# Patient Record
Sex: Female | Born: 1975 | Race: Black or African American | Hispanic: No | Marital: Married | Smoking: Former smoker
Health system: Southern US, Community
[De-identification: ages and names within clinical notes are randomized; demographics above are authoritative.]

## PROBLEM LIST (undated history)

## (undated) DIAGNOSIS — I639 Cerebral infarction, unspecified: Secondary | ICD-10-CM

## (undated) DIAGNOSIS — Z5189 Encounter for other specified aftercare: Secondary | ICD-10-CM

## (undated) DIAGNOSIS — R0789 Other chest pain: Secondary | ICD-10-CM

## (undated) DIAGNOSIS — D219 Benign neoplasm of connective and other soft tissue, unspecified: Secondary | ICD-10-CM

## (undated) DIAGNOSIS — K047 Periapical abscess without sinus: Secondary | ICD-10-CM

## (undated) DIAGNOSIS — D573 Sickle-cell trait: Secondary | ICD-10-CM

## (undated) DIAGNOSIS — D649 Anemia, unspecified: Secondary | ICD-10-CM

## (undated) DIAGNOSIS — R569 Unspecified convulsions: Secondary | ICD-10-CM

## (undated) DIAGNOSIS — M069 Rheumatoid arthritis, unspecified: Secondary | ICD-10-CM

## (undated) HISTORY — DX: Other chest pain: R07.89

---

## 1998-03-26 ENCOUNTER — Emergency Department (HOSPITAL_COMMUNITY): Admission: EM | Admit: 1998-03-26 | Discharge: 1998-03-26 | Payer: Self-pay | Admitting: Emergency Medicine

## 1998-03-30 ENCOUNTER — Emergency Department (HOSPITAL_COMMUNITY): Admission: EM | Admit: 1998-03-30 | Discharge: 1998-03-30 | Payer: Self-pay | Admitting: Emergency Medicine

## 1998-06-16 ENCOUNTER — Emergency Department (HOSPITAL_COMMUNITY): Admission: EM | Admit: 1998-06-16 | Discharge: 1998-06-16 | Payer: Self-pay | Admitting: *Deleted

## 1998-06-22 ENCOUNTER — Emergency Department (HOSPITAL_COMMUNITY): Admission: EM | Admit: 1998-06-22 | Discharge: 1998-06-22 | Payer: Self-pay | Admitting: Emergency Medicine

## 1998-09-28 ENCOUNTER — Emergency Department (HOSPITAL_COMMUNITY): Admission: EM | Admit: 1998-09-28 | Discharge: 1998-09-28 | Payer: Self-pay | Admitting: Emergency Medicine

## 1998-11-23 ENCOUNTER — Emergency Department (HOSPITAL_COMMUNITY): Admission: EM | Admit: 1998-11-23 | Discharge: 1998-11-23 | Payer: Self-pay | Admitting: Emergency Medicine

## 1999-01-30 ENCOUNTER — Emergency Department (HOSPITAL_COMMUNITY): Admission: EM | Admit: 1999-01-30 | Discharge: 1999-01-30 | Payer: Self-pay | Admitting: *Deleted

## 1999-02-17 ENCOUNTER — Emergency Department (HOSPITAL_COMMUNITY): Admission: EM | Admit: 1999-02-17 | Discharge: 1999-02-17 | Payer: Self-pay | Admitting: Emergency Medicine

## 1999-04-19 ENCOUNTER — Encounter: Payer: Self-pay | Admitting: Emergency Medicine

## 1999-04-19 ENCOUNTER — Emergency Department (HOSPITAL_COMMUNITY): Admission: EM | Admit: 1999-04-19 | Discharge: 1999-04-19 | Payer: Self-pay | Admitting: Emergency Medicine

## 1999-11-29 ENCOUNTER — Emergency Department (HOSPITAL_COMMUNITY): Admission: EM | Admit: 1999-11-29 | Discharge: 1999-11-29 | Payer: Self-pay | Admitting: Emergency Medicine

## 1999-11-30 ENCOUNTER — Encounter: Payer: Self-pay | Admitting: Emergency Medicine

## 1999-12-04 ENCOUNTER — Emergency Department (HOSPITAL_COMMUNITY): Admission: EM | Admit: 1999-12-04 | Discharge: 1999-12-04 | Payer: Self-pay | Admitting: *Deleted

## 1999-12-05 ENCOUNTER — Encounter: Payer: Self-pay | Admitting: Emergency Medicine

## 2000-02-06 ENCOUNTER — Emergency Department (HOSPITAL_COMMUNITY): Admission: EM | Admit: 2000-02-06 | Discharge: 2000-02-06 | Payer: Self-pay | Admitting: Emergency Medicine

## 2000-02-08 ENCOUNTER — Emergency Department (HOSPITAL_COMMUNITY): Admission: EM | Admit: 2000-02-08 | Discharge: 2000-02-08 | Payer: Self-pay | Admitting: Emergency Medicine

## 2000-02-08 ENCOUNTER — Encounter: Payer: Self-pay | Admitting: Emergency Medicine

## 2000-03-22 ENCOUNTER — Emergency Department (HOSPITAL_COMMUNITY): Admission: EM | Admit: 2000-03-22 | Discharge: 2000-03-22 | Payer: Self-pay | Admitting: *Deleted

## 2000-04-22 ENCOUNTER — Emergency Department (HOSPITAL_COMMUNITY): Admission: EM | Admit: 2000-04-22 | Discharge: 2000-04-22 | Payer: Self-pay | Admitting: Emergency Medicine

## 2000-05-28 ENCOUNTER — Emergency Department (HOSPITAL_COMMUNITY): Admission: EM | Admit: 2000-05-28 | Discharge: 2000-05-28 | Payer: Self-pay | Admitting: Emergency Medicine

## 2000-05-28 ENCOUNTER — Encounter: Payer: Self-pay | Admitting: Emergency Medicine

## 2000-06-11 ENCOUNTER — Emergency Department (HOSPITAL_COMMUNITY): Admission: EM | Admit: 2000-06-11 | Discharge: 2000-06-11 | Payer: Self-pay

## 2000-08-28 ENCOUNTER — Emergency Department (HOSPITAL_COMMUNITY): Admission: EM | Admit: 2000-08-28 | Discharge: 2000-08-28 | Payer: Self-pay | Admitting: Emergency Medicine

## 2000-10-15 ENCOUNTER — Encounter (INDEPENDENT_AMBULATORY_CARE_PROVIDER_SITE_OTHER): Payer: Self-pay | Admitting: Specialist

## 2000-10-15 ENCOUNTER — Other Ambulatory Visit: Admission: RE | Admit: 2000-10-15 | Discharge: 2000-10-15 | Payer: Self-pay | Admitting: Obstetrics

## 2000-10-20 ENCOUNTER — Emergency Department (HOSPITAL_COMMUNITY): Admission: EM | Admit: 2000-10-20 | Discharge: 2000-10-20 | Payer: Self-pay | Admitting: Emergency Medicine

## 2000-11-08 ENCOUNTER — Inpatient Hospital Stay (HOSPITAL_COMMUNITY): Admission: AD | Admit: 2000-11-08 | Discharge: 2000-11-08 | Payer: Self-pay | Admitting: Obstetrics & Gynecology

## 2000-12-03 ENCOUNTER — Encounter: Payer: Self-pay | Admitting: Emergency Medicine

## 2000-12-03 ENCOUNTER — Emergency Department (HOSPITAL_COMMUNITY): Admission: EM | Admit: 2000-12-03 | Discharge: 2000-12-03 | Payer: Self-pay | Admitting: Emergency Medicine

## 2001-01-06 ENCOUNTER — Emergency Department (HOSPITAL_COMMUNITY): Admission: EM | Admit: 2001-01-06 | Discharge: 2001-01-06 | Payer: Self-pay | Admitting: Emergency Medicine

## 2001-01-08 ENCOUNTER — Emergency Department (HOSPITAL_COMMUNITY): Admission: EM | Admit: 2001-01-08 | Discharge: 2001-01-08 | Payer: Self-pay | Admitting: Emergency Medicine

## 2001-01-10 ENCOUNTER — Inpatient Hospital Stay (HOSPITAL_COMMUNITY): Admission: EM | Admit: 2001-01-10 | Discharge: 2001-01-12 | Payer: Self-pay | Admitting: Emergency Medicine

## 2001-01-10 ENCOUNTER — Encounter: Payer: Self-pay | Admitting: Emergency Medicine

## 2001-01-11 ENCOUNTER — Encounter: Payer: Self-pay | Admitting: Internal Medicine

## 2001-01-17 ENCOUNTER — Encounter: Admission: RE | Admit: 2001-01-17 | Discharge: 2001-01-17 | Payer: Self-pay | Admitting: Internal Medicine

## 2001-02-14 ENCOUNTER — Inpatient Hospital Stay (HOSPITAL_COMMUNITY): Admission: EM | Admit: 2001-02-14 | Discharge: 2001-02-16 | Payer: Self-pay | Admitting: Emergency Medicine

## 2001-02-14 ENCOUNTER — Encounter: Payer: Self-pay | Admitting: Infectious Diseases

## 2001-02-14 ENCOUNTER — Encounter: Payer: Self-pay | Admitting: Emergency Medicine

## 2001-02-26 ENCOUNTER — Encounter: Admission: RE | Admit: 2001-02-26 | Discharge: 2001-02-26 | Payer: Self-pay | Admitting: Hematology and Oncology

## 2001-04-19 ENCOUNTER — Encounter: Admission: RE | Admit: 2001-04-19 | Discharge: 2001-04-19 | Payer: Self-pay | Admitting: Obstetrics & Gynecology

## 2001-05-19 ENCOUNTER — Emergency Department (HOSPITAL_COMMUNITY): Admission: EM | Admit: 2001-05-19 | Discharge: 2001-05-19 | Payer: Self-pay

## 2001-06-10 ENCOUNTER — Emergency Department (HOSPITAL_COMMUNITY): Admission: EM | Admit: 2001-06-10 | Discharge: 2001-06-11 | Payer: Self-pay

## 2001-06-11 ENCOUNTER — Emergency Department (HOSPITAL_COMMUNITY): Admission: EM | Admit: 2001-06-11 | Discharge: 2001-06-11 | Payer: Self-pay | Admitting: Emergency Medicine

## 2001-06-11 ENCOUNTER — Encounter: Payer: Self-pay | Admitting: Emergency Medicine

## 2001-07-08 ENCOUNTER — Emergency Department (HOSPITAL_COMMUNITY): Admission: EM | Admit: 2001-07-08 | Discharge: 2001-07-08 | Payer: Self-pay | Admitting: Emergency Medicine

## 2001-07-14 ENCOUNTER — Inpatient Hospital Stay (HOSPITAL_COMMUNITY): Admission: AD | Admit: 2001-07-14 | Discharge: 2001-07-14 | Payer: Self-pay | Admitting: Obstetrics

## 2001-09-09 ENCOUNTER — Emergency Department (HOSPITAL_COMMUNITY): Admission: EM | Admit: 2001-09-09 | Discharge: 2001-09-09 | Payer: Self-pay | Admitting: Emergency Medicine

## 2001-09-09 ENCOUNTER — Encounter: Payer: Self-pay | Admitting: Emergency Medicine

## 2001-10-09 ENCOUNTER — Inpatient Hospital Stay (HOSPITAL_COMMUNITY): Admission: AD | Admit: 2001-10-09 | Discharge: 2001-10-09 | Payer: Self-pay | Admitting: Obstetrics

## 2001-10-10 ENCOUNTER — Inpatient Hospital Stay (HOSPITAL_COMMUNITY): Admission: AD | Admit: 2001-10-10 | Discharge: 2001-10-10 | Payer: Self-pay | Admitting: Obstetrics & Gynecology

## 2001-10-10 ENCOUNTER — Encounter: Payer: Self-pay | Admitting: Obstetrics & Gynecology

## 2001-10-15 ENCOUNTER — Emergency Department (HOSPITAL_COMMUNITY): Admission: EM | Admit: 2001-10-15 | Discharge: 2001-10-15 | Payer: Self-pay | Admitting: Emergency Medicine

## 2001-11-25 ENCOUNTER — Emergency Department (HOSPITAL_COMMUNITY): Admission: EM | Admit: 2001-11-25 | Discharge: 2001-11-25 | Payer: Self-pay | Admitting: Emergency Medicine

## 2002-01-17 ENCOUNTER — Emergency Department (HOSPITAL_COMMUNITY): Admission: EM | Admit: 2002-01-17 | Discharge: 2002-01-18 | Payer: Self-pay | Admitting: Emergency Medicine

## 2002-04-15 ENCOUNTER — Inpatient Hospital Stay (HOSPITAL_COMMUNITY): Admission: AD | Admit: 2002-04-15 | Discharge: 2002-04-15 | Payer: Self-pay | Admitting: *Deleted

## 2002-04-15 ENCOUNTER — Encounter: Payer: Self-pay | Admitting: *Deleted

## 2002-06-07 ENCOUNTER — Emergency Department (HOSPITAL_COMMUNITY): Admission: EM | Admit: 2002-06-07 | Discharge: 2002-06-07 | Payer: Self-pay | Admitting: Emergency Medicine

## 2002-07-02 ENCOUNTER — Emergency Department (HOSPITAL_COMMUNITY): Admission: EM | Admit: 2002-07-02 | Discharge: 2002-07-03 | Payer: Self-pay | Admitting: Emergency Medicine

## 2002-07-02 ENCOUNTER — Encounter: Payer: Self-pay | Admitting: Emergency Medicine

## 2002-08-12 ENCOUNTER — Encounter: Payer: Self-pay | Admitting: Emergency Medicine

## 2002-08-12 ENCOUNTER — Emergency Department (HOSPITAL_COMMUNITY): Admission: EM | Admit: 2002-08-12 | Discharge: 2002-08-12 | Payer: Self-pay | Admitting: Emergency Medicine

## 2002-08-13 ENCOUNTER — Emergency Department (HOSPITAL_COMMUNITY): Admission: EM | Admit: 2002-08-13 | Discharge: 2002-08-13 | Payer: Self-pay

## 2002-09-22 ENCOUNTER — Inpatient Hospital Stay (HOSPITAL_COMMUNITY): Admission: AD | Admit: 2002-09-22 | Discharge: 2002-09-22 | Payer: Self-pay | Admitting: *Deleted

## 2002-11-01 ENCOUNTER — Emergency Department (HOSPITAL_COMMUNITY): Admission: EM | Admit: 2002-11-01 | Discharge: 2002-11-01 | Payer: Self-pay | Admitting: Emergency Medicine

## 2002-11-12 ENCOUNTER — Emergency Department (HOSPITAL_COMMUNITY): Admission: EM | Admit: 2002-11-12 | Discharge: 2002-11-12 | Payer: Self-pay

## 2002-12-14 ENCOUNTER — Emergency Department (HOSPITAL_COMMUNITY): Admission: EM | Admit: 2002-12-14 | Discharge: 2002-12-14 | Payer: Self-pay | Admitting: Emergency Medicine

## 2003-03-03 ENCOUNTER — Inpatient Hospital Stay (HOSPITAL_COMMUNITY): Admission: AD | Admit: 2003-03-03 | Discharge: 2003-03-03 | Payer: Self-pay | Admitting: *Deleted

## 2003-05-03 ENCOUNTER — Emergency Department (HOSPITAL_COMMUNITY): Admission: EM | Admit: 2003-05-03 | Discharge: 2003-05-04 | Payer: Self-pay

## 2003-05-04 ENCOUNTER — Encounter: Payer: Self-pay | Admitting: Emergency Medicine

## 2003-05-13 ENCOUNTER — Inpatient Hospital Stay (HOSPITAL_COMMUNITY): Admission: AD | Admit: 2003-05-13 | Discharge: 2003-05-13 | Payer: Self-pay | Admitting: Family Medicine

## 2003-05-14 ENCOUNTER — Encounter: Payer: Self-pay | Admitting: Family Medicine

## 2003-05-23 ENCOUNTER — Inpatient Hospital Stay (HOSPITAL_COMMUNITY): Admission: AD | Admit: 2003-05-23 | Discharge: 2003-05-23 | Payer: Self-pay | Admitting: Obstetrics and Gynecology

## 2003-06-21 ENCOUNTER — Inpatient Hospital Stay (HOSPITAL_COMMUNITY): Admission: AD | Admit: 2003-06-21 | Discharge: 2003-06-22 | Payer: Self-pay | Admitting: *Deleted

## 2003-06-24 ENCOUNTER — Encounter: Payer: Self-pay | Admitting: Obstetrics and Gynecology

## 2003-06-24 ENCOUNTER — Inpatient Hospital Stay (HOSPITAL_COMMUNITY): Admission: AD | Admit: 2003-06-24 | Discharge: 2003-06-24 | Payer: Self-pay | Admitting: Obstetrics and Gynecology

## 2003-07-18 ENCOUNTER — Inpatient Hospital Stay (HOSPITAL_COMMUNITY): Admission: AD | Admit: 2003-07-18 | Discharge: 2003-07-18 | Payer: Self-pay | Admitting: *Deleted

## 2003-08-02 ENCOUNTER — Inpatient Hospital Stay (HOSPITAL_COMMUNITY): Admission: AD | Admit: 2003-08-02 | Discharge: 2003-08-02 | Payer: Self-pay | Admitting: Obstetrics & Gynecology

## 2003-08-02 ENCOUNTER — Encounter: Payer: Self-pay | Admitting: Obstetrics & Gynecology

## 2003-08-03 ENCOUNTER — Ambulatory Visit (HOSPITAL_COMMUNITY): Admission: RE | Admit: 2003-08-03 | Discharge: 2003-08-03 | Payer: Self-pay | Admitting: *Deleted

## 2003-08-22 ENCOUNTER — Inpatient Hospital Stay (HOSPITAL_COMMUNITY): Admission: AD | Admit: 2003-08-22 | Discharge: 2003-08-22 | Payer: Self-pay | Admitting: Obstetrics and Gynecology

## 2003-09-03 ENCOUNTER — Emergency Department (HOSPITAL_COMMUNITY): Admission: EM | Admit: 2003-09-03 | Discharge: 2003-09-03 | Payer: Self-pay | Admitting: Emergency Medicine

## 2003-09-30 ENCOUNTER — Inpatient Hospital Stay (HOSPITAL_COMMUNITY): Admission: AD | Admit: 2003-09-30 | Discharge: 2003-09-30 | Payer: Self-pay | Admitting: *Deleted

## 2003-10-15 ENCOUNTER — Inpatient Hospital Stay (HOSPITAL_COMMUNITY): Admission: AD | Admit: 2003-10-15 | Discharge: 2003-10-15 | Payer: Self-pay | Admitting: Obstetrics & Gynecology

## 2003-10-20 ENCOUNTER — Emergency Department (HOSPITAL_COMMUNITY): Admission: AD | Admit: 2003-10-20 | Discharge: 2003-10-20 | Payer: Self-pay | Admitting: Family Medicine

## 2003-10-27 ENCOUNTER — Inpatient Hospital Stay (HOSPITAL_COMMUNITY): Admission: AD | Admit: 2003-10-27 | Discharge: 2003-10-28 | Payer: Self-pay | Admitting: Obstetrics & Gynecology

## 2003-11-28 ENCOUNTER — Inpatient Hospital Stay (HOSPITAL_COMMUNITY): Admission: AD | Admit: 2003-11-28 | Discharge: 2003-11-28 | Payer: Self-pay | Admitting: Family Medicine

## 2003-12-12 HISTORY — PX: TUBAL LIGATION: SHX77

## 2003-12-16 ENCOUNTER — Inpatient Hospital Stay (HOSPITAL_COMMUNITY): Admission: AD | Admit: 2003-12-16 | Discharge: 2003-12-16 | Payer: Self-pay | Admitting: Obstetrics and Gynecology

## 2003-12-19 ENCOUNTER — Inpatient Hospital Stay (HOSPITAL_COMMUNITY): Admission: AD | Admit: 2003-12-19 | Discharge: 2003-12-19 | Payer: Self-pay | Admitting: Obstetrics & Gynecology

## 2003-12-20 ENCOUNTER — Inpatient Hospital Stay (HOSPITAL_COMMUNITY): Admission: AD | Admit: 2003-12-20 | Discharge: 2003-12-20 | Payer: Self-pay | Admitting: Family Medicine

## 2003-12-25 ENCOUNTER — Inpatient Hospital Stay (HOSPITAL_COMMUNITY): Admission: AD | Admit: 2003-12-25 | Discharge: 2003-12-25 | Payer: Self-pay | Admitting: Family Medicine

## 2003-12-26 ENCOUNTER — Inpatient Hospital Stay (HOSPITAL_COMMUNITY): Admission: AD | Admit: 2003-12-26 | Discharge: 2003-12-28 | Payer: Self-pay | Admitting: Obstetrics & Gynecology

## 2003-12-27 ENCOUNTER — Encounter (INDEPENDENT_AMBULATORY_CARE_PROVIDER_SITE_OTHER): Payer: Self-pay | Admitting: Specialist

## 2004-05-27 ENCOUNTER — Emergency Department (HOSPITAL_COMMUNITY): Admission: EM | Admit: 2004-05-27 | Discharge: 2004-05-27 | Payer: Self-pay | Admitting: Family Medicine

## 2004-06-05 ENCOUNTER — Emergency Department (HOSPITAL_COMMUNITY): Admission: EM | Admit: 2004-06-05 | Discharge: 2004-06-05 | Payer: Self-pay | Admitting: Emergency Medicine

## 2004-06-23 IMAGING — US US OB FOLLOW-UP
1 series · 3 of 3 positions shown · non-contrast
Comparison: none

CLINICAL DATA: 27 year old.  G2 P1 with scan performed to evaluate growth and biophysical profile.

[Series 1: us ob re-eval · 3 of 3 slices shown]
[im 1/3]
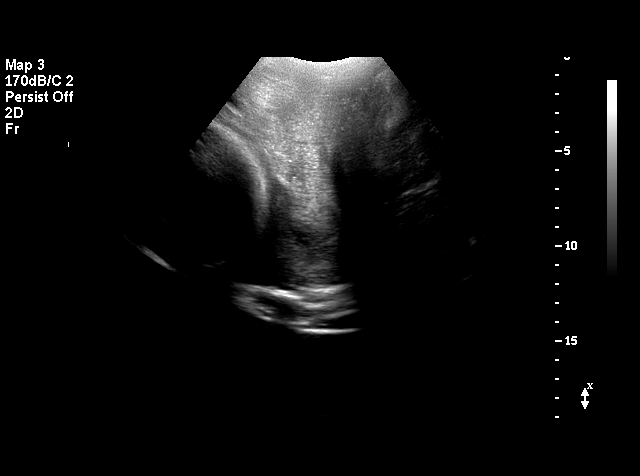
[im 2/3]
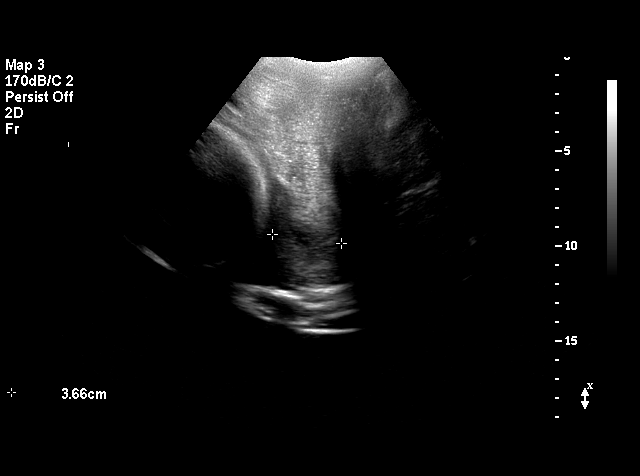
[im 3/3]
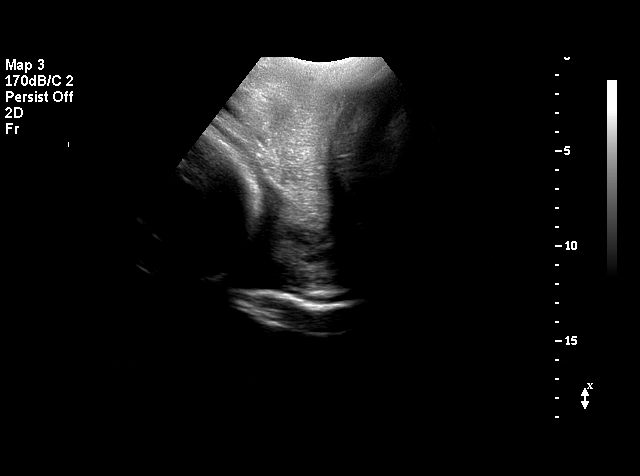

[3 of 3 positions shown; findings below may reference images not displayed]

OBSTETRICAL ULTRASOUND RE-EVALUATION

 NUMBER OF FETUSES:  1
 HEART RATE:  136
 MOVEMENT:  Yes
 BREATHING:  Yes
 PRESENTATION:  Cephalic
 PLACENTAL LOCATION:  Posterior
 GRADE:  I
 PREVIA:  No
 AMNIOTIC FLUID (subjective):  Normal
 AMNIOTIC FLUID (objective):  16.4 cm AFI (5th – 95th %ile = 8.8 – 23.8 cm for 31 weeks)

 FETAL BIOMETRY
 BPD:  7.8 cm    31 w 2 d
 HC:  28.4 cm  31 w 2 d
 AC:  25.9 cm  30 w 1 d
 FL:  5.6 cm  29 w 4 d
 MEAN GA:  30 w 4 d
 ASSIGNED GA:  30 w 6 d (1st US)
 EFW:  2922 (H) 50th – 75th %ile (7268 – 3130 g) For 31 weeks

 FETAL ANATOMY
 LATERAL VENTRICLES:  Visualized 
 THALAMI/CSP:  Previously seen 
 POSTERIOR FOSSA:  Previously seen 
 NUCHAL REGION:  N/A
 SPINE:  Previously seen 
 4 CHAMBER HEART ON LEFT:  Previously seen 
 STOMACH ON LEFT:  Visualized 
 3 VESSEL CORD:  Previously seen 
 CORD INSERTION SITE:  Previously seen 
 KIDNEYS:  Visualized 
 BLADDER:  Visualized 
 EXTREMITIES:  Previously seen 

 ADDITIONAL ANATOMY VISUALIZED:  Diaphragm and male genitalia

 MATERNAL FINDINGS
 CERVIX:  3.7 cm Translabially
 BIOPHYSICAL PROFILE:
 MOVEMENT:   2, BREATHING:  2,  TONE:  2, FLUID 2 = Total 8 of 8 in 30 minutes
IMPRESSION: Single living intrauterine fetus in cephalic presentation.  There has been appropriate interval growth.  Biophysical profile 8 of 8.  Amniotic fluid volume is within normal limits.

## 2004-07-04 ENCOUNTER — Emergency Department (HOSPITAL_COMMUNITY): Admission: EM | Admit: 2004-07-04 | Discharge: 2004-07-05 | Payer: Self-pay | Admitting: Emergency Medicine

## 2004-11-01 ENCOUNTER — Emergency Department (HOSPITAL_COMMUNITY): Admission: EM | Admit: 2004-11-01 | Discharge: 2004-11-01 | Payer: Self-pay | Admitting: Emergency Medicine

## 2004-11-30 ENCOUNTER — Emergency Department (HOSPITAL_COMMUNITY): Admission: EM | Admit: 2004-11-30 | Discharge: 2004-12-01 | Payer: Self-pay | Admitting: Emergency Medicine

## 2004-12-21 ENCOUNTER — Emergency Department (HOSPITAL_COMMUNITY): Admission: EM | Admit: 2004-12-21 | Discharge: 2004-12-22 | Payer: Self-pay | Admitting: Emergency Medicine

## 2005-03-03 IMAGING — CR DG CHEST 2V
2 series · 2 of 2 positions shown · non-contrast
Comparison: none

CLINICAL DATA: Left-sided chest pain.
 CHEST, TWO VIEWS

  The heart size and mediastinal contours are normal. The lungs are clear. The visualized skeleton is unremarkable.
 IMPRESSION
 No active disease.

[view not recorded (1 of 2)]
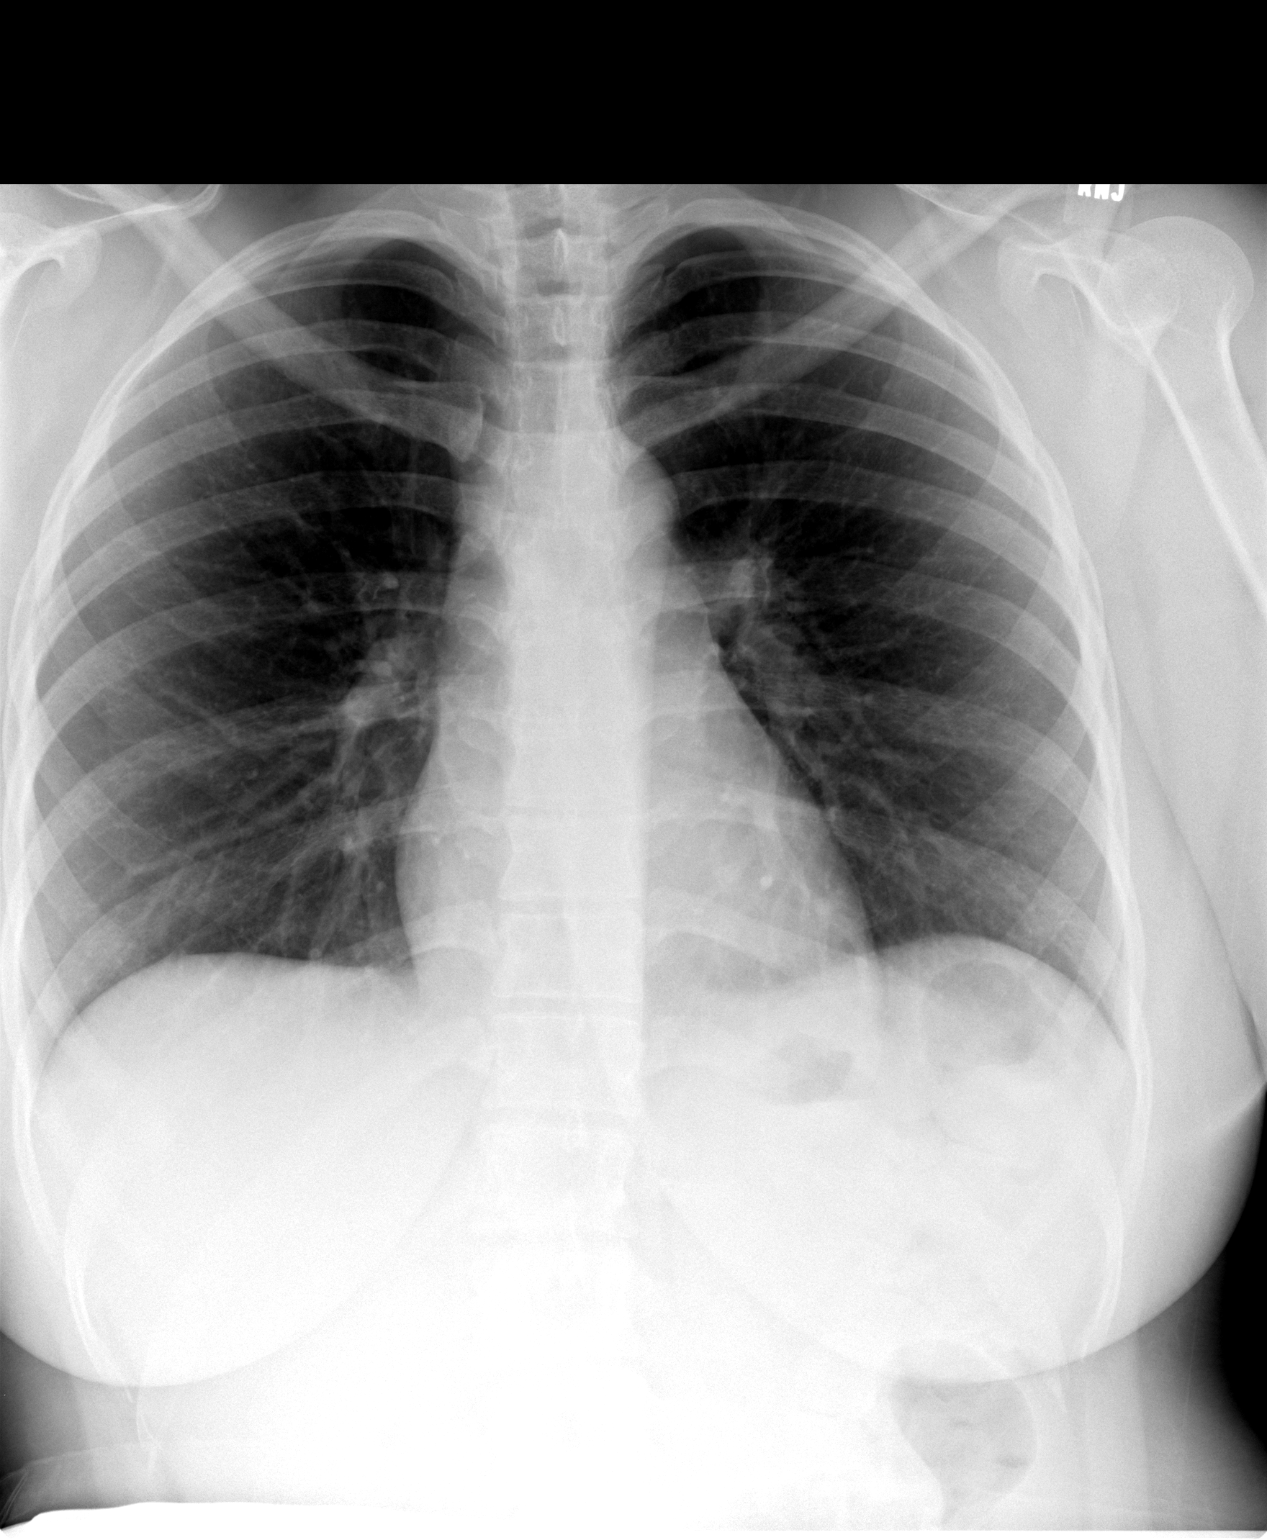

[view not recorded (2 of 2)]
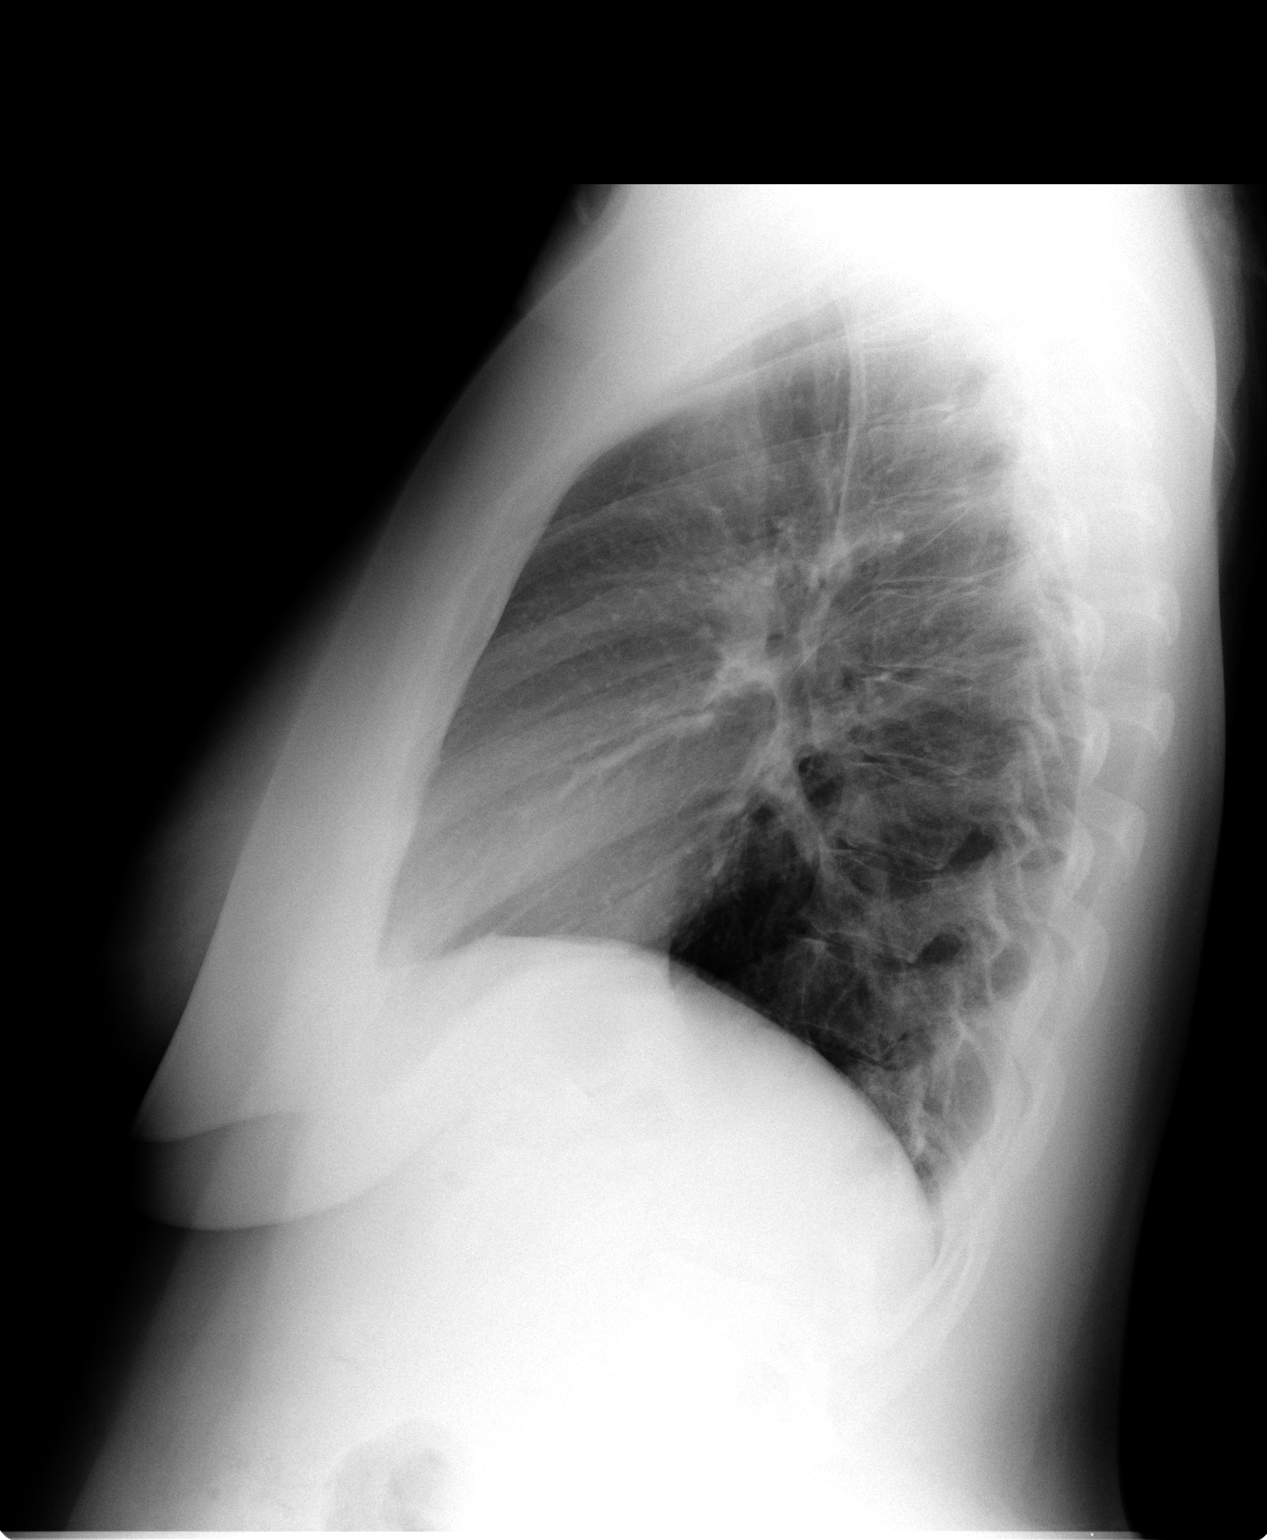

[2 of 2 positions shown; findings below may reference images not displayed]

## 2005-03-03 IMAGING — CR DG CERVICAL SPINE COMPLETE 4+V
6 series · 6 of 6 positions shown · non-contrast
Comparison: none

CLINICAL DATA: Left-sided neck pain with tingling in the arms. 
 CERVICAL SPINE (FIVE VIEWS)
 The alignment is normal.  There is no evidence of fracture, soft tissue swelling, or degenerative change.
 IMPRESSION
 Negative radiographs.

[view not recorded (1 of 6)]
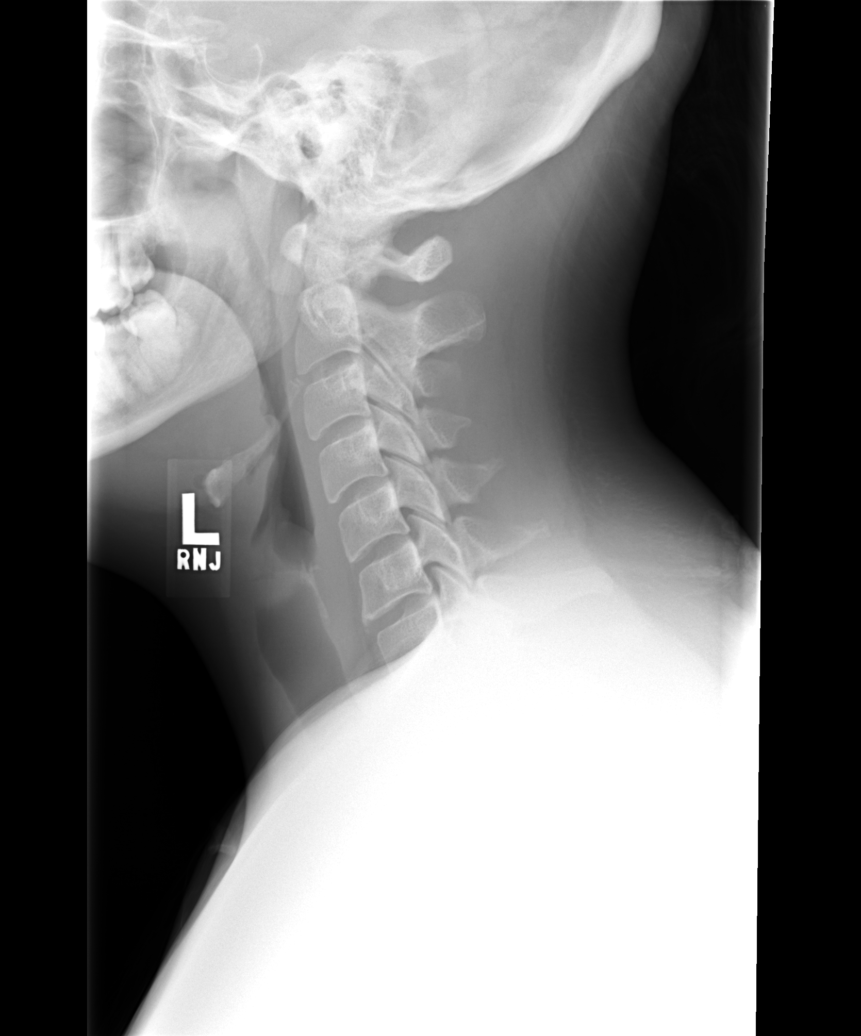

[view not recorded (2 of 6)]
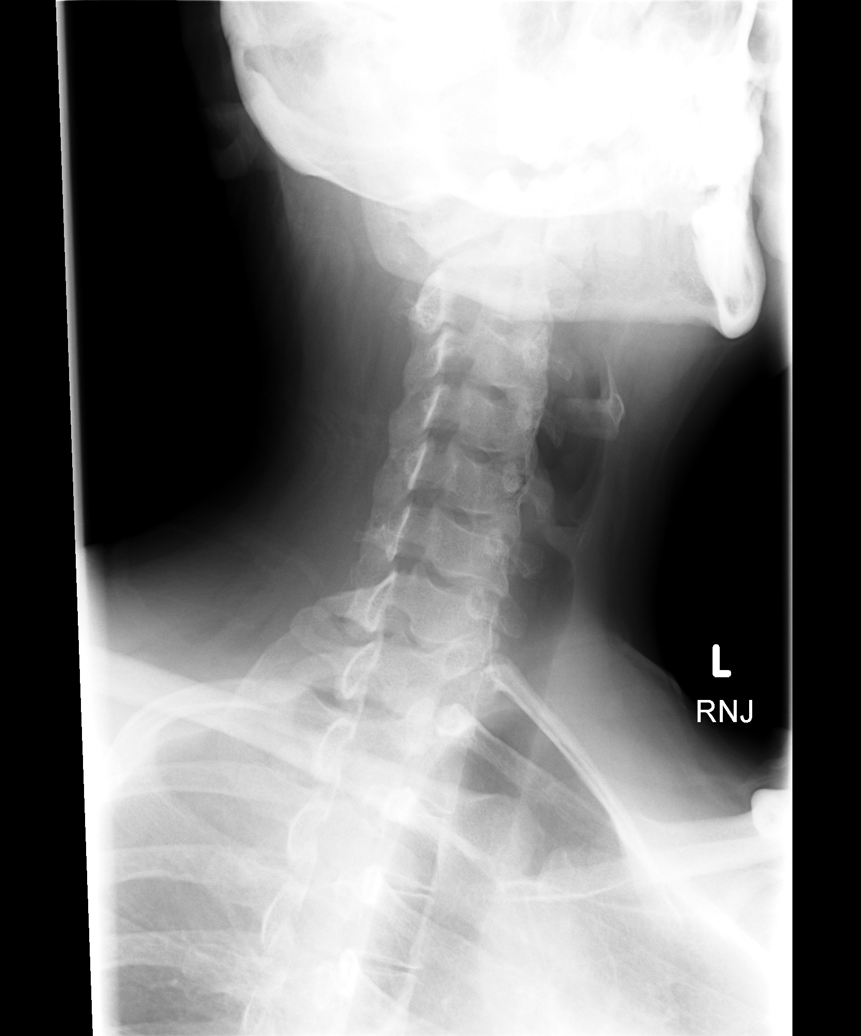

[view not recorded (3 of 6)]
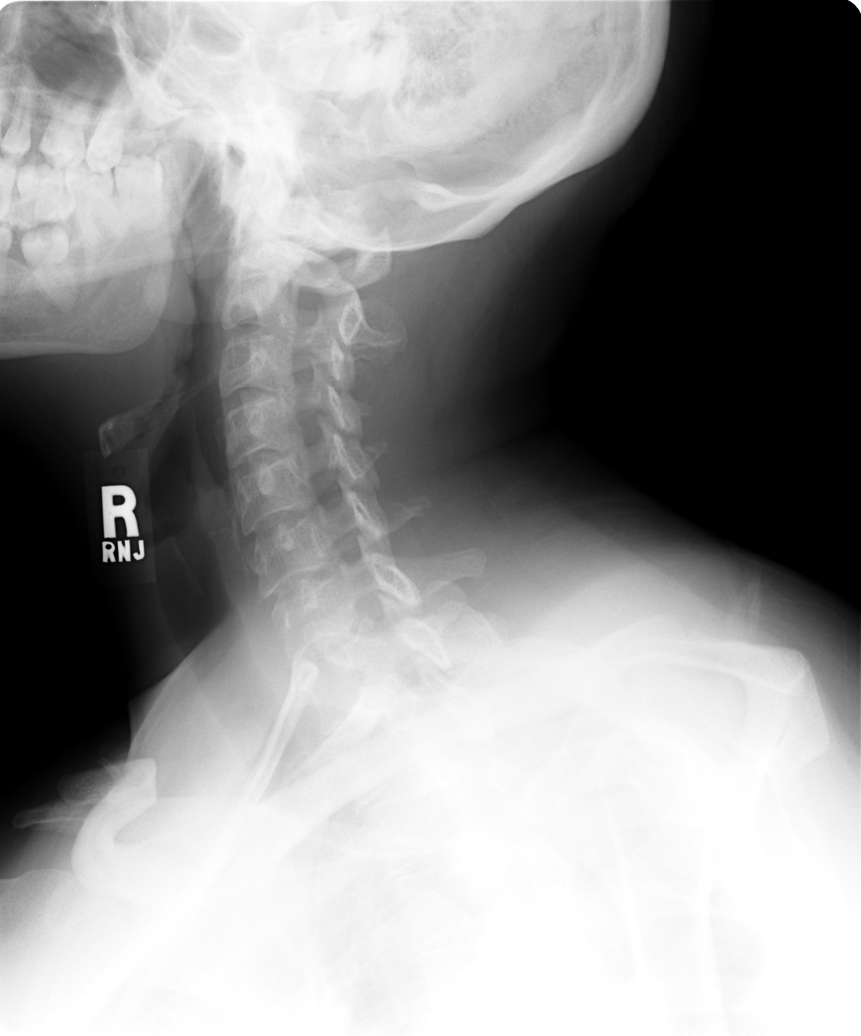

[view not recorded (4 of 6)]
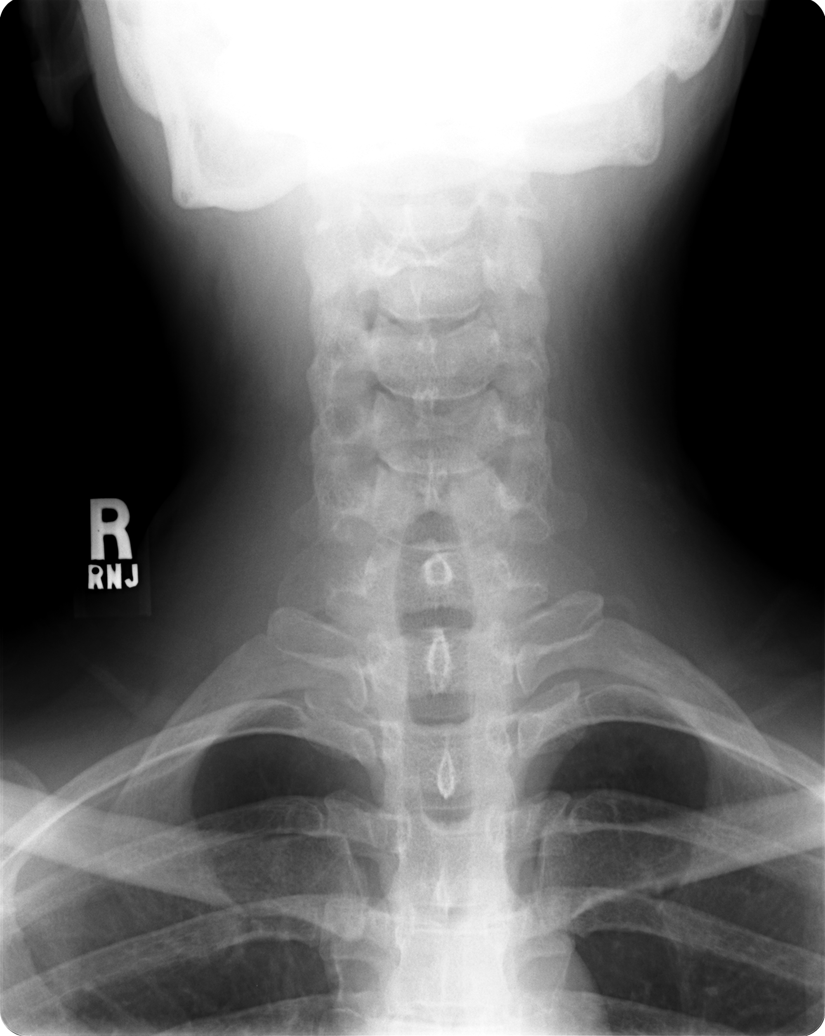

[view not recorded (5 of 6)]
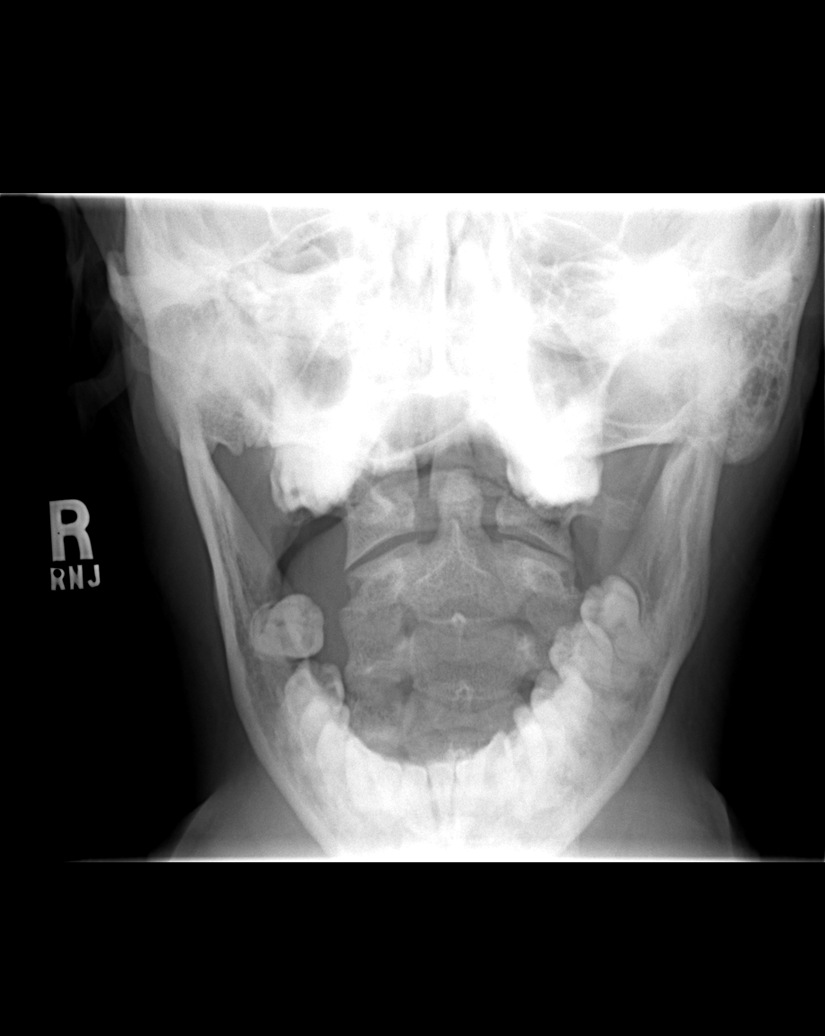

[view not recorded (6 of 6)]
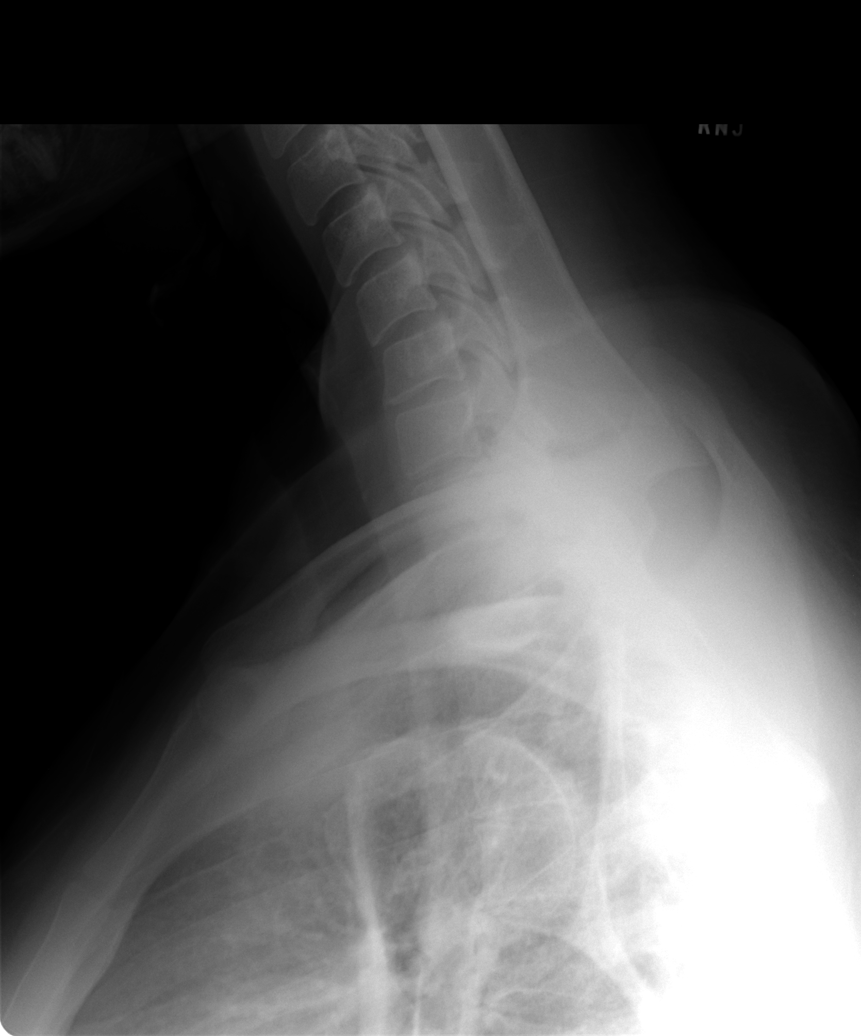

[6 of 6 positions shown; findings below may reference images not displayed]

## 2005-04-04 ENCOUNTER — Emergency Department (HOSPITAL_COMMUNITY): Admission: EM | Admit: 2005-04-04 | Discharge: 2005-04-04 | Payer: Self-pay | Admitting: Emergency Medicine

## 2005-04-24 ENCOUNTER — Emergency Department (HOSPITAL_COMMUNITY): Admission: EM | Admit: 2005-04-24 | Discharge: 2005-04-24 | Payer: Self-pay | Admitting: Family Medicine

## 2005-05-24 ENCOUNTER — Emergency Department (HOSPITAL_COMMUNITY): Admission: EM | Admit: 2005-05-24 | Discharge: 2005-05-24 | Payer: Self-pay | Admitting: Emergency Medicine

## 2005-06-04 ENCOUNTER — Emergency Department (HOSPITAL_COMMUNITY): Admission: EM | Admit: 2005-06-04 | Discharge: 2005-06-05 | Payer: Self-pay | Admitting: Emergency Medicine

## 2005-06-19 ENCOUNTER — Emergency Department (HOSPITAL_COMMUNITY): Admission: EM | Admit: 2005-06-19 | Discharge: 2005-06-19 | Payer: Self-pay | Admitting: Emergency Medicine

## 2005-07-22 ENCOUNTER — Emergency Department (HOSPITAL_COMMUNITY): Admission: EM | Admit: 2005-07-22 | Discharge: 2005-07-22 | Payer: Self-pay | Admitting: Emergency Medicine

## 2005-08-02 ENCOUNTER — Inpatient Hospital Stay (HOSPITAL_COMMUNITY): Admission: AD | Admit: 2005-08-02 | Discharge: 2005-08-02 | Payer: Self-pay | Admitting: Obstetrics and Gynecology

## 2005-09-12 ENCOUNTER — Emergency Department (HOSPITAL_COMMUNITY): Admission: EM | Admit: 2005-09-12 | Discharge: 2005-09-13 | Payer: Self-pay | Admitting: Emergency Medicine

## 2005-10-23 ENCOUNTER — Emergency Department (HOSPITAL_COMMUNITY): Admission: EM | Admit: 2005-10-23 | Discharge: 2005-10-23 | Payer: Self-pay | Admitting: Family Medicine

## 2005-11-08 ENCOUNTER — Emergency Department (HOSPITAL_COMMUNITY): Admission: EM | Admit: 2005-11-08 | Discharge: 2005-11-08 | Payer: Self-pay | Admitting: *Deleted

## 2005-12-01 IMAGING — CT CT ABDOMEN W/ CM
1 of 2 series · 15 of 32 positions shown, 19 images · IV contrast (omnipaque)
Comparison: None.

CLINICAL DATA: 29-year-old; abdominal pain
TECHNIQUE: Helical CT examination of the abdomen and pelvis was performed after bolus infusion of a total of 100 cc Omnipaque 300 and the use of dilute oral contrast.

[Series 2: routine abdomen · axial · 0.74mm/px · z∈[-417,-52]mm · 15 of 81 slices shown, 19 images]
[im 4/81  soft-tissue]
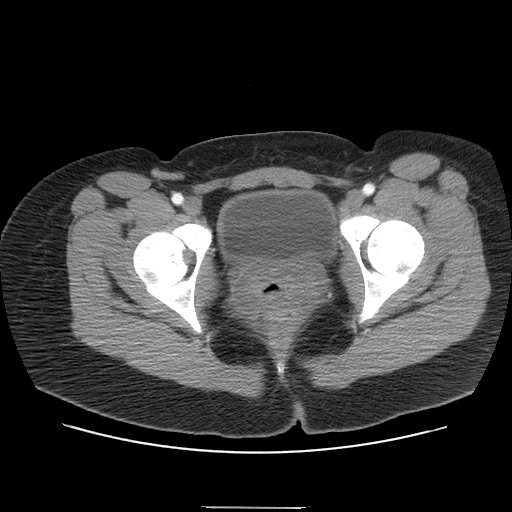
[im 4/81  bone]
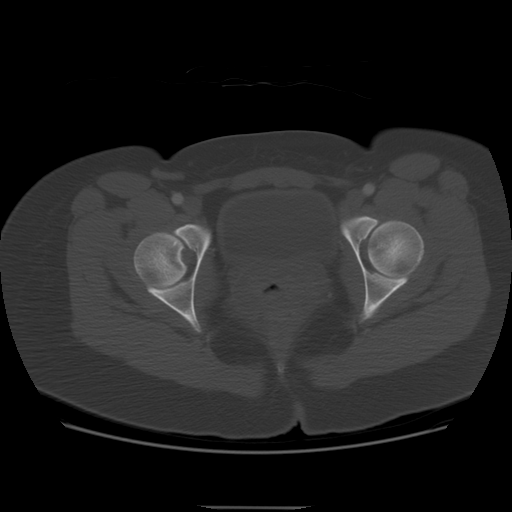
[im 11/81  soft-tissue]
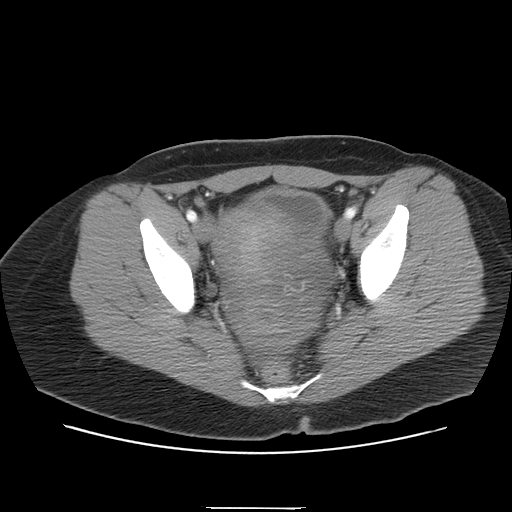
[im 18/81  soft-tissue]
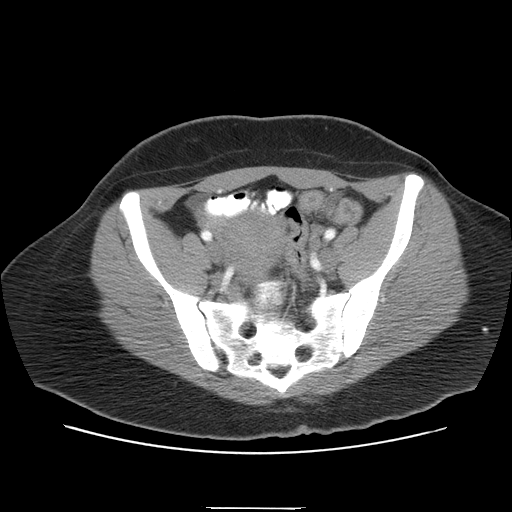
[im 21/81  soft-tissue]
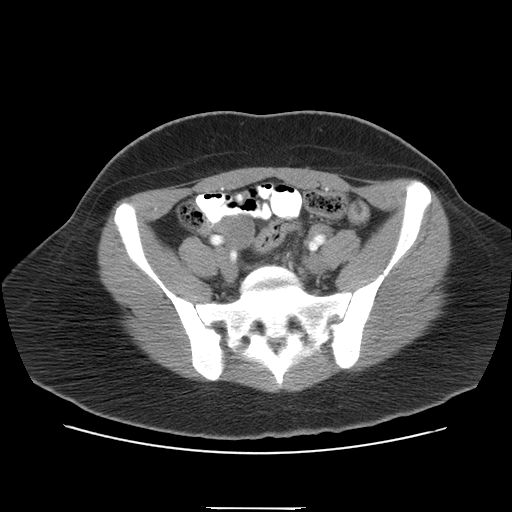
[im 28/81  soft-tissue]
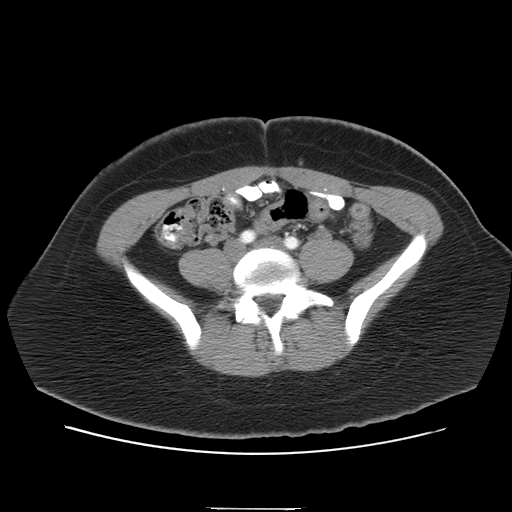
[im 35/81  soft-tissue]
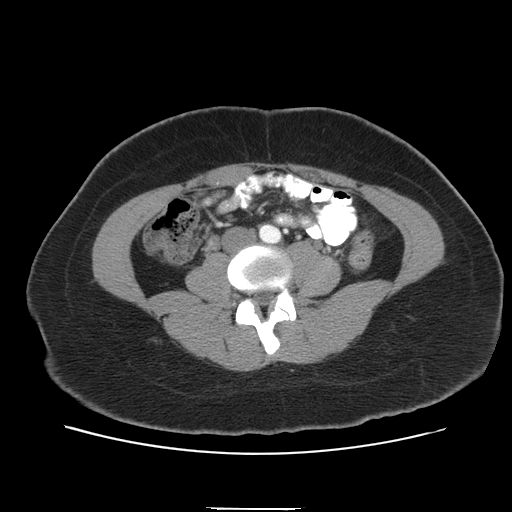
[im 42/81  soft-tissue]
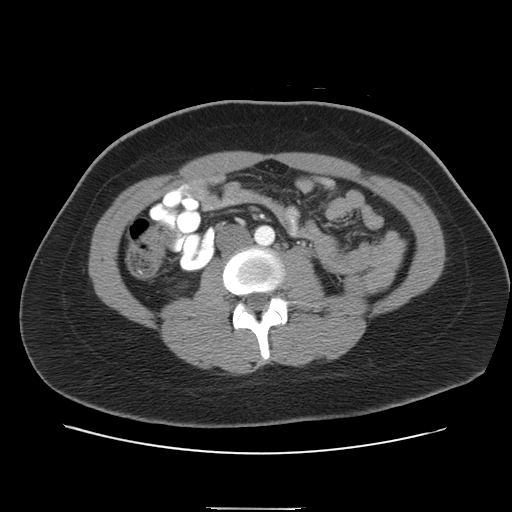
[im 46/81  soft-tissue]
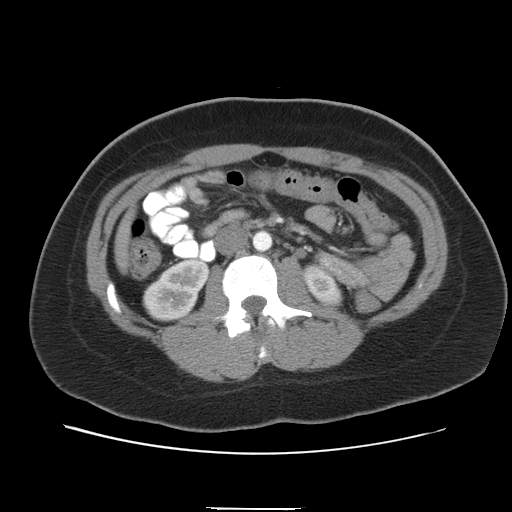
[im 53/81  soft-tissue]
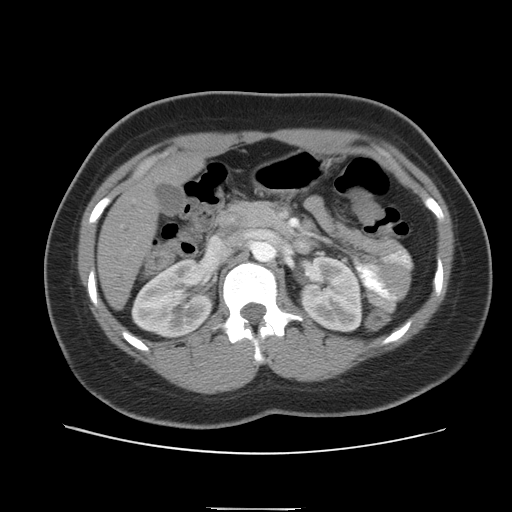
[im 53/81  bone]
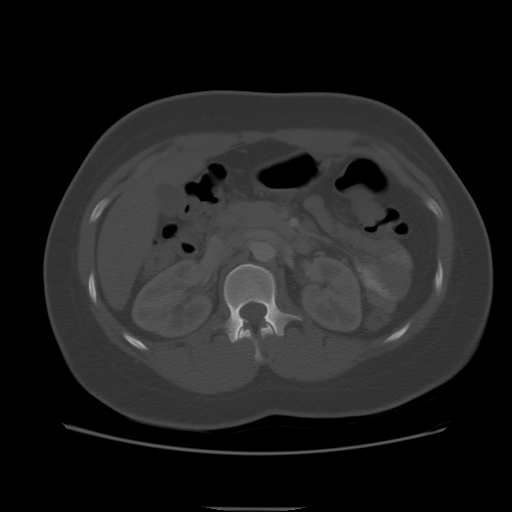
[im 60/81  soft-tissue]
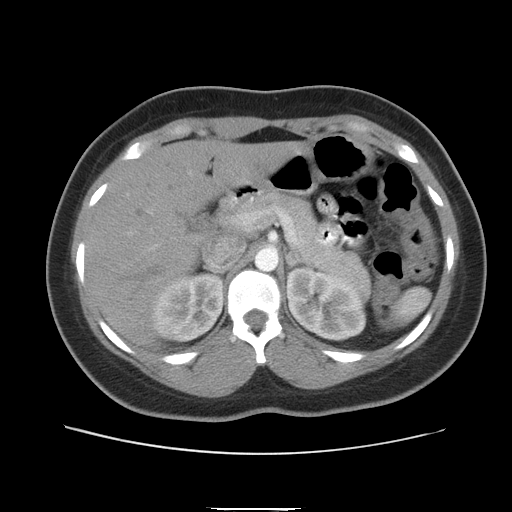
[im 63/81  soft-tissue]
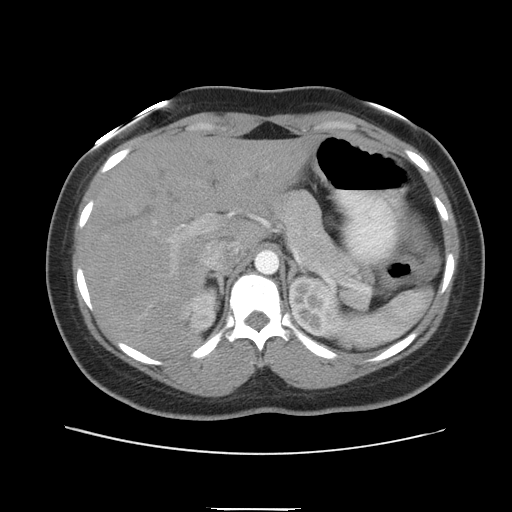
[im 67/81  lung]
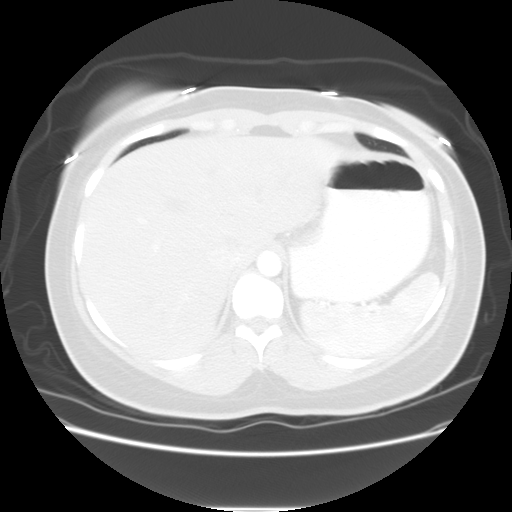
[im 70/81  soft-tissue]
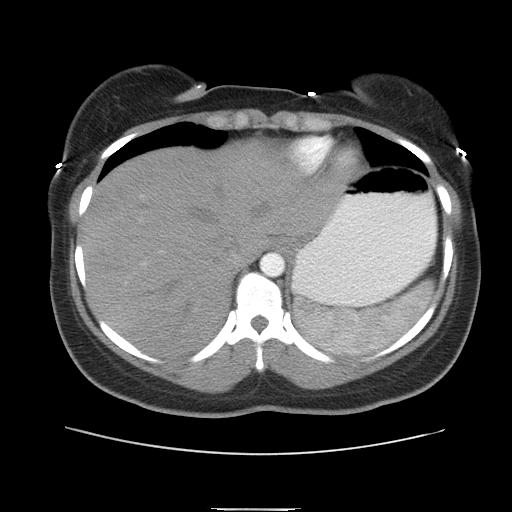
[im 70/81  lung]
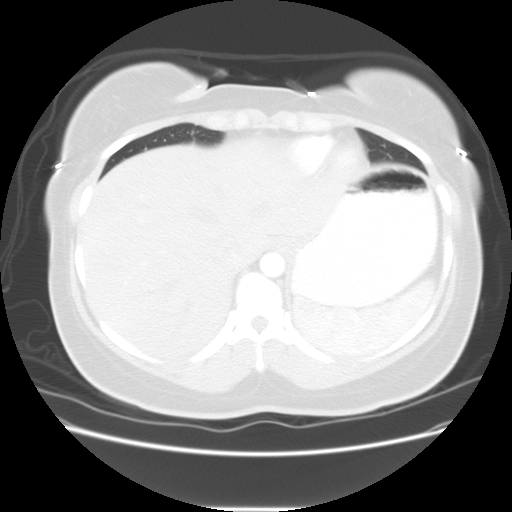
[im 74/81  lung]
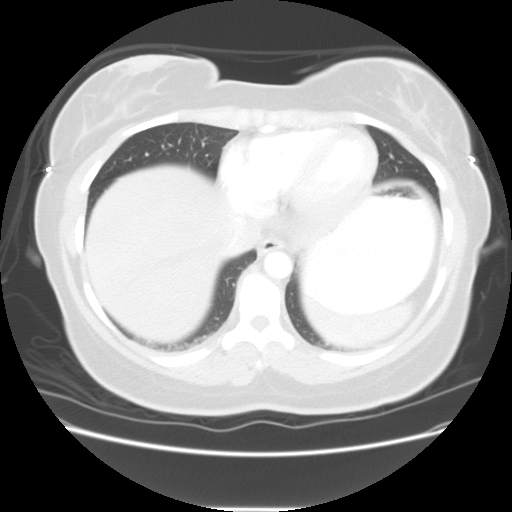
[im 77/81  soft-tissue]
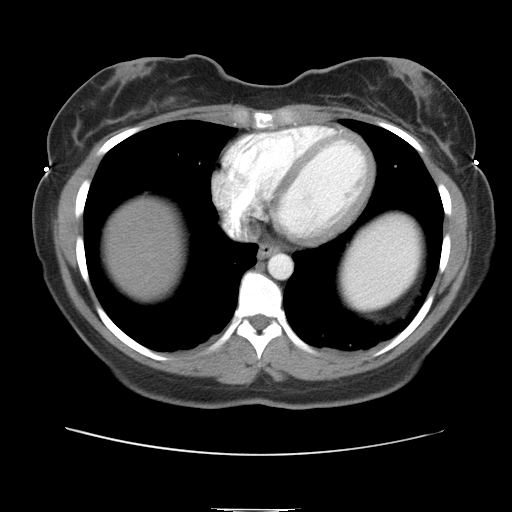
[im 77/81  lung]
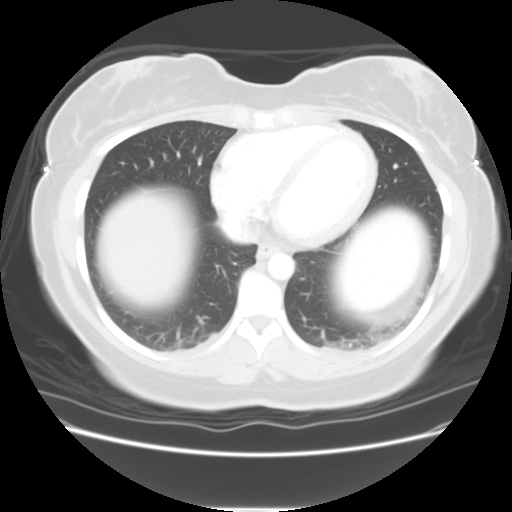

[15 of 32 positions shown; findings below may reference images not displayed]

CT ABDOMEN WITH CONTRAST:
 The lung bases are clear except for minimal dependent atelectasis.  
 The liver, spleen, pancreas, adrenal glands, and kidneys are unremarkable.  Stomach is fairly well distended with contrast and demonstrates no abnormalities.  The duodenum, small bowel, and colon are unremarkable.  The gallbladder appears normal.  The aorta is normal in caliber.  Major branch vessels are normal.  The portal and splenic veins are patent.  No mesenteric or retroperitoneal masses or adenopathy.
IMPRESSION: Unremarkable CT examination of the abdomen.
 CT OF THE PELVIS WITH CONTRAST:
 The appendix is visualized and appears normal.  Terminal ileum is not well distended with contrast but is grossly normal.  The rectum, sigmoid colon, and visualized small bowel loops are unremarkable.  Major vascular structures are patent.  The uterus and ovaries demonstrate no significant findings.  There is mildly prominent endometrium and may be due to secretory phase of ovulation.  No pelvic masses, adenopathy, or free pelvic fluid collections.  Probable cyst associated with the left ovary.  No significant bony findings.
IMPRESSION: 1.  Normal CT appearance of the appendix.  
 2.  Probable cyst associated with the left ovary.  
 3.  No pelvic masses or adenopathy.  Very small amount of free pelvic fluid.

## 2005-12-25 ENCOUNTER — Inpatient Hospital Stay (HOSPITAL_COMMUNITY): Admission: AD | Admit: 2005-12-25 | Discharge: 2005-12-25 | Payer: Self-pay | Admitting: Obstetrics

## 2006-01-22 ENCOUNTER — Emergency Department (HOSPITAL_COMMUNITY): Admission: EM | Admit: 2006-01-22 | Discharge: 2006-01-22 | Payer: Self-pay | Admitting: Emergency Medicine

## 2006-02-01 IMAGING — CR DG CHEST 2V
2 series · 2 of 2 positions shown · non-contrast
Comparison: 07/05/2004.

CLINICAL DATA: Weakness.  Shaking.  
 CHEST - TWO VIEW:

[w chest pa]
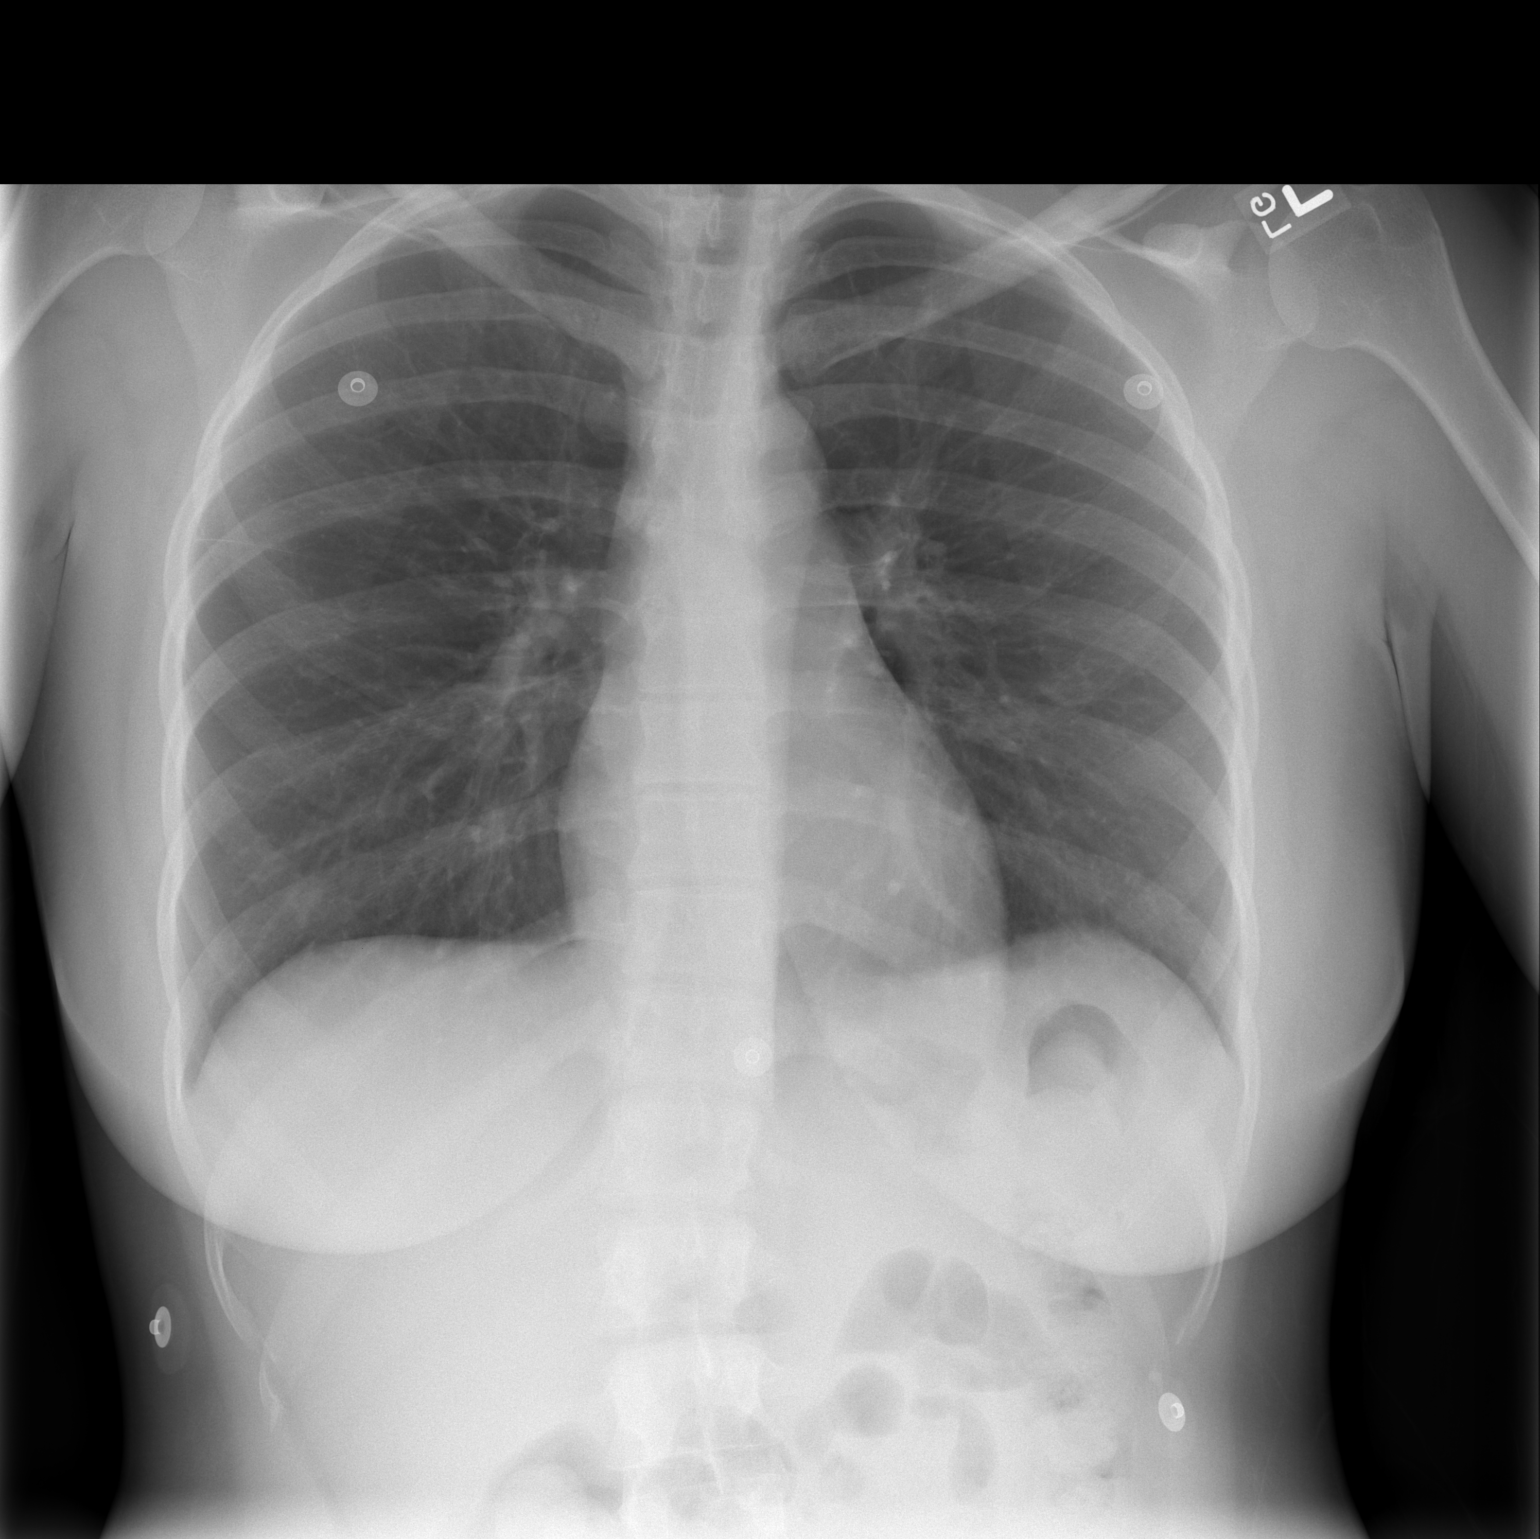

[w chest lat]
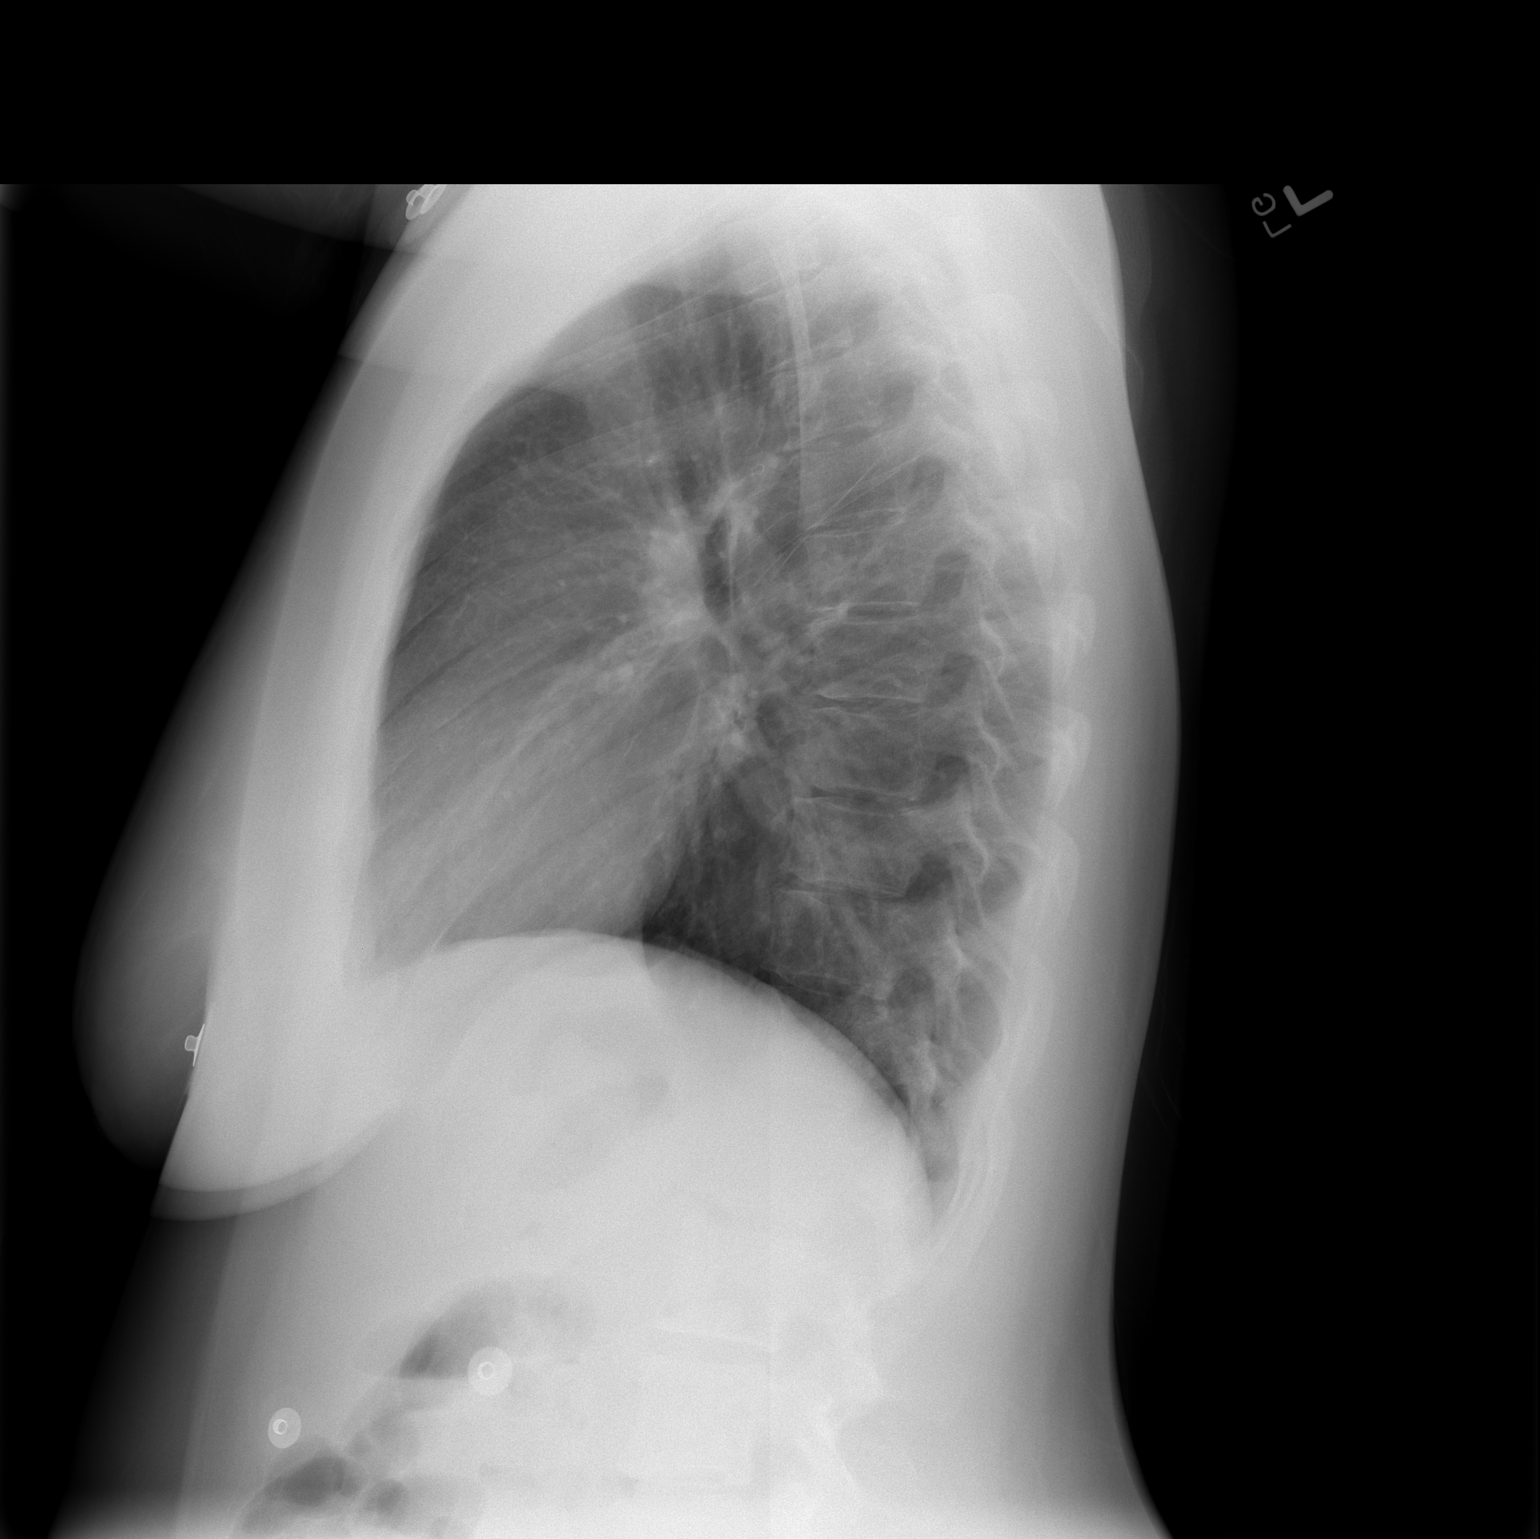

[2 of 2 positions shown; findings below may reference images not displayed]

FINDINGS: Lungs are clear.  No pleural effusion.  Heart size normal.
IMPRESSION: No acute disease.

## 2006-02-09 ENCOUNTER — Emergency Department (HOSPITAL_COMMUNITY): Admission: EM | Admit: 2006-02-09 | Discharge: 2006-02-09 | Payer: Self-pay | Admitting: Emergency Medicine

## 2006-05-10 ENCOUNTER — Emergency Department (HOSPITAL_COMMUNITY): Admission: EM | Admit: 2006-05-10 | Discharge: 2006-05-10 | Payer: Self-pay | Admitting: Emergency Medicine

## 2006-05-29 ENCOUNTER — Emergency Department (HOSPITAL_COMMUNITY): Admission: EM | Admit: 2006-05-29 | Discharge: 2006-05-29 | Payer: Self-pay | Admitting: Family Medicine

## 2006-06-20 ENCOUNTER — Emergency Department (HOSPITAL_COMMUNITY): Admission: EM | Admit: 2006-06-20 | Discharge: 2006-06-21 | Payer: Self-pay | Admitting: Emergency Medicine

## 2006-07-29 ENCOUNTER — Emergency Department (HOSPITAL_COMMUNITY): Admission: EM | Admit: 2006-07-29 | Discharge: 2006-07-29 | Payer: Self-pay | Admitting: Emergency Medicine

## 2006-07-31 ENCOUNTER — Emergency Department (HOSPITAL_COMMUNITY): Admission: EM | Admit: 2006-07-31 | Discharge: 2006-07-31 | Payer: Self-pay | Admitting: Emergency Medicine

## 2006-08-10 ENCOUNTER — Emergency Department (HOSPITAL_COMMUNITY): Admission: EM | Admit: 2006-08-10 | Discharge: 2006-08-10 | Payer: Self-pay | Admitting: Family Medicine

## 2006-08-19 ENCOUNTER — Emergency Department (HOSPITAL_COMMUNITY): Admission: EM | Admit: 2006-08-19 | Discharge: 2006-08-19 | Payer: Self-pay | Admitting: Emergency Medicine

## 2006-08-27 ENCOUNTER — Emergency Department (HOSPITAL_COMMUNITY): Admission: EM | Admit: 2006-08-27 | Discharge: 2006-08-27 | Payer: Self-pay | Admitting: Emergency Medicine

## 2006-09-19 ENCOUNTER — Emergency Department (HOSPITAL_COMMUNITY): Admission: EM | Admit: 2006-09-19 | Discharge: 2006-09-19 | Payer: Self-pay | Admitting: Emergency Medicine

## 2006-09-20 IMAGING — CR DG CHEST 2V
2 series · 2 of 2 positions shown · non-contrast
Comparison: 06/05/05.

CLINICAL DATA: Chest pain and cold-like symptoms. 
 CHEST ? 2 VIEW:

[w chest pa]
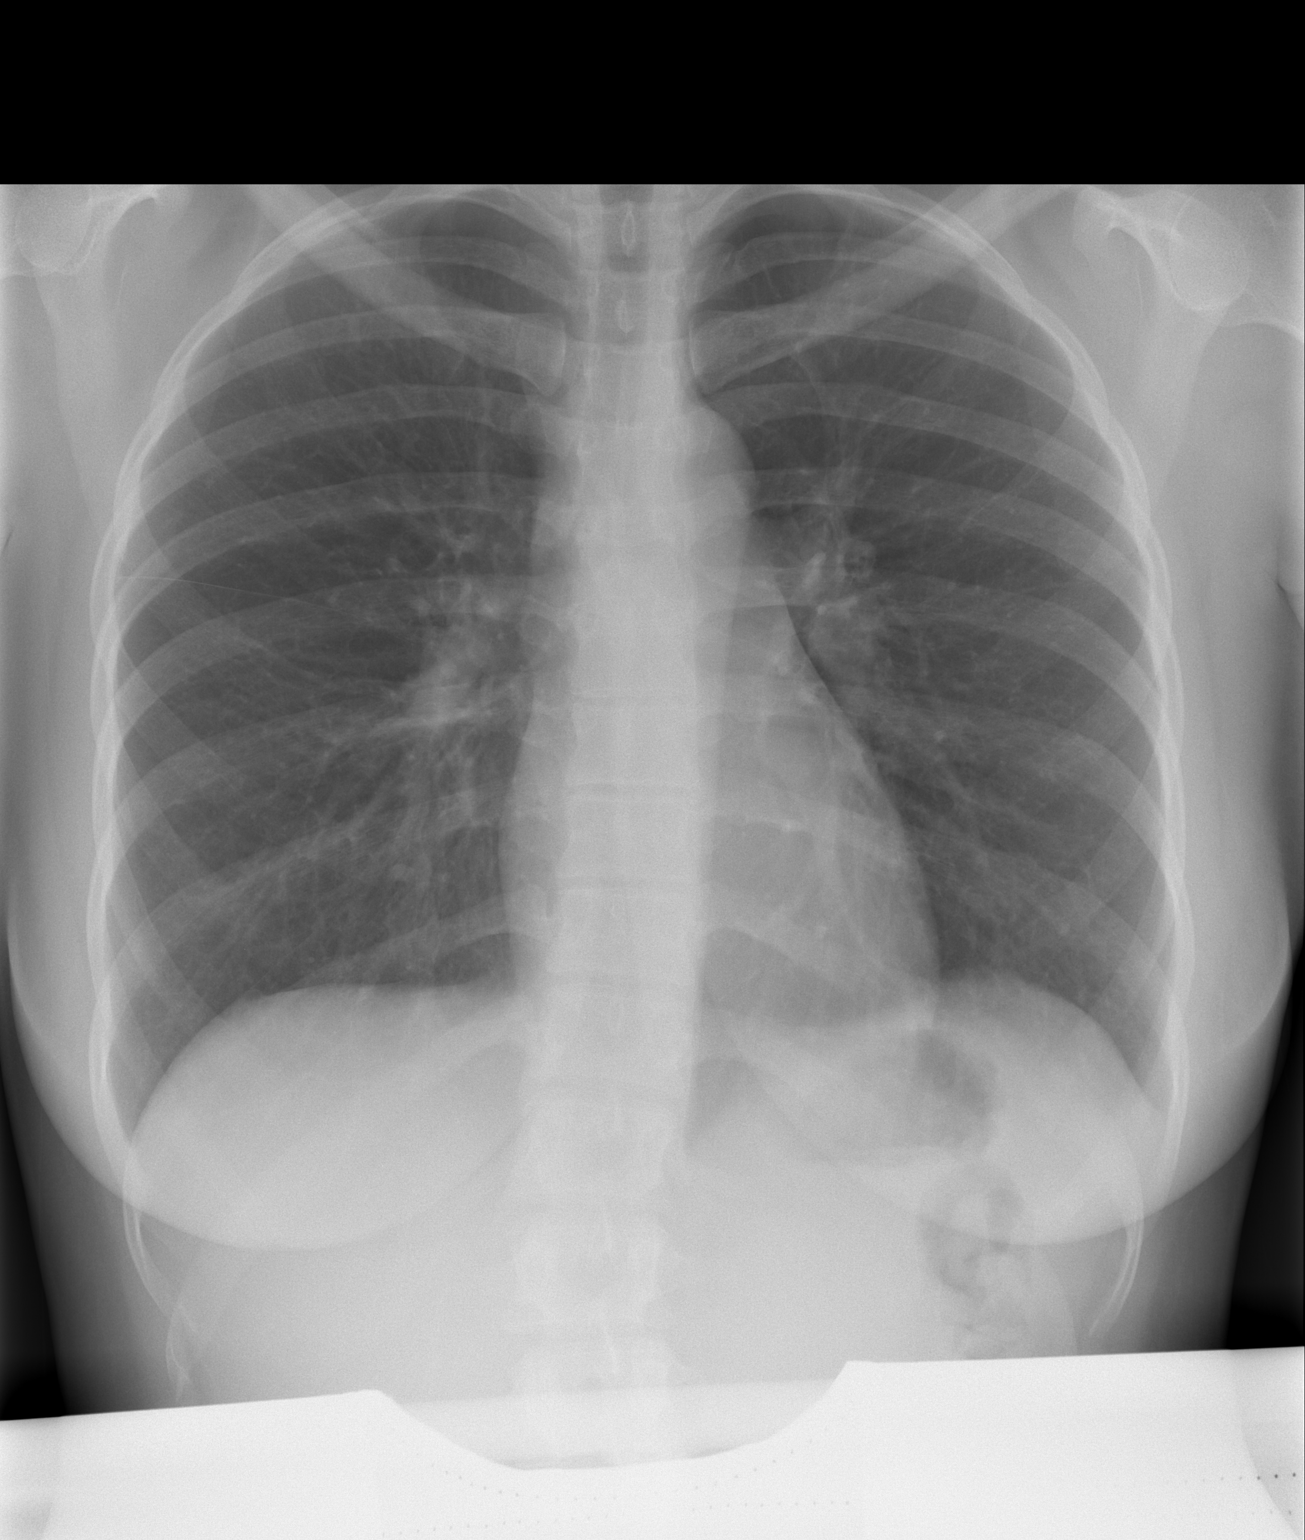

[w chest lat]
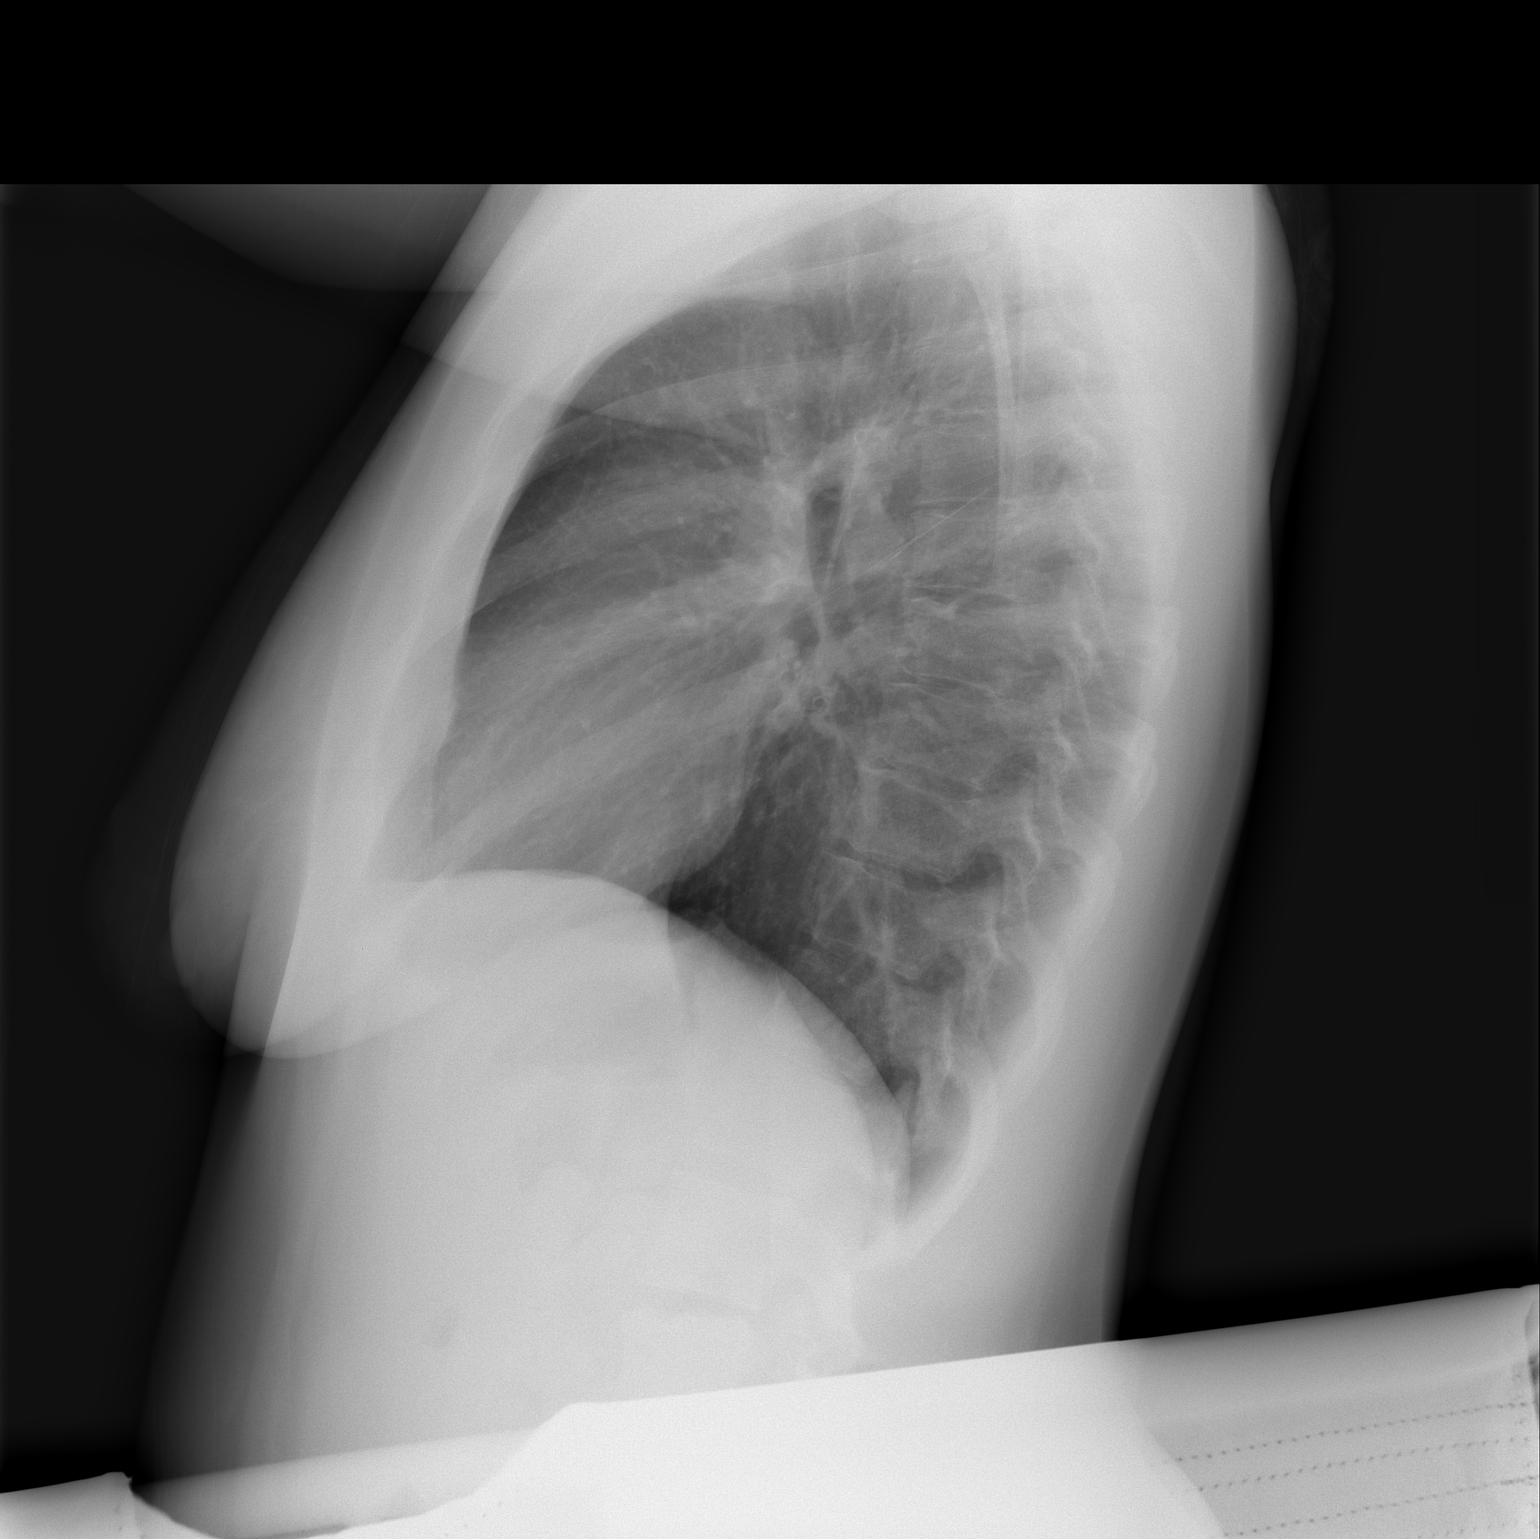

[2 of 2 positions shown; findings below may reference images not displayed]

FINDINGS: Heart size is normal.  No effusions.  No edema.  No focal airspace opacities.
IMPRESSION: No active cardiopulmonary disease.

## 2006-09-28 ENCOUNTER — Emergency Department (HOSPITAL_COMMUNITY): Admission: EM | Admit: 2006-09-28 | Discharge: 2006-09-28 | Payer: Self-pay | Admitting: Emergency Medicine

## 2006-11-26 ENCOUNTER — Emergency Department (HOSPITAL_COMMUNITY): Admission: EM | Admit: 2006-11-26 | Discharge: 2006-11-26 | Payer: Self-pay | Admitting: Emergency Medicine

## 2006-12-11 DIAGNOSIS — I639 Cerebral infarction, unspecified: Secondary | ICD-10-CM

## 2006-12-11 HISTORY — DX: Cerebral infarction, unspecified: I63.9

## 2007-01-18 ENCOUNTER — Emergency Department (HOSPITAL_COMMUNITY): Admission: EM | Admit: 2007-01-18 | Discharge: 2007-01-18 | Payer: Self-pay | Admitting: Emergency Medicine

## 2007-02-16 IMAGING — CR DG CHEST 2V
2 series · 2 of 2 positions shown · non-contrast
Comparison: 01/22/06.

CLINICAL DATA: Left arm and chest pain.
 CHEST - 2 VIEW ? 06/20/06:

[w chest pa]
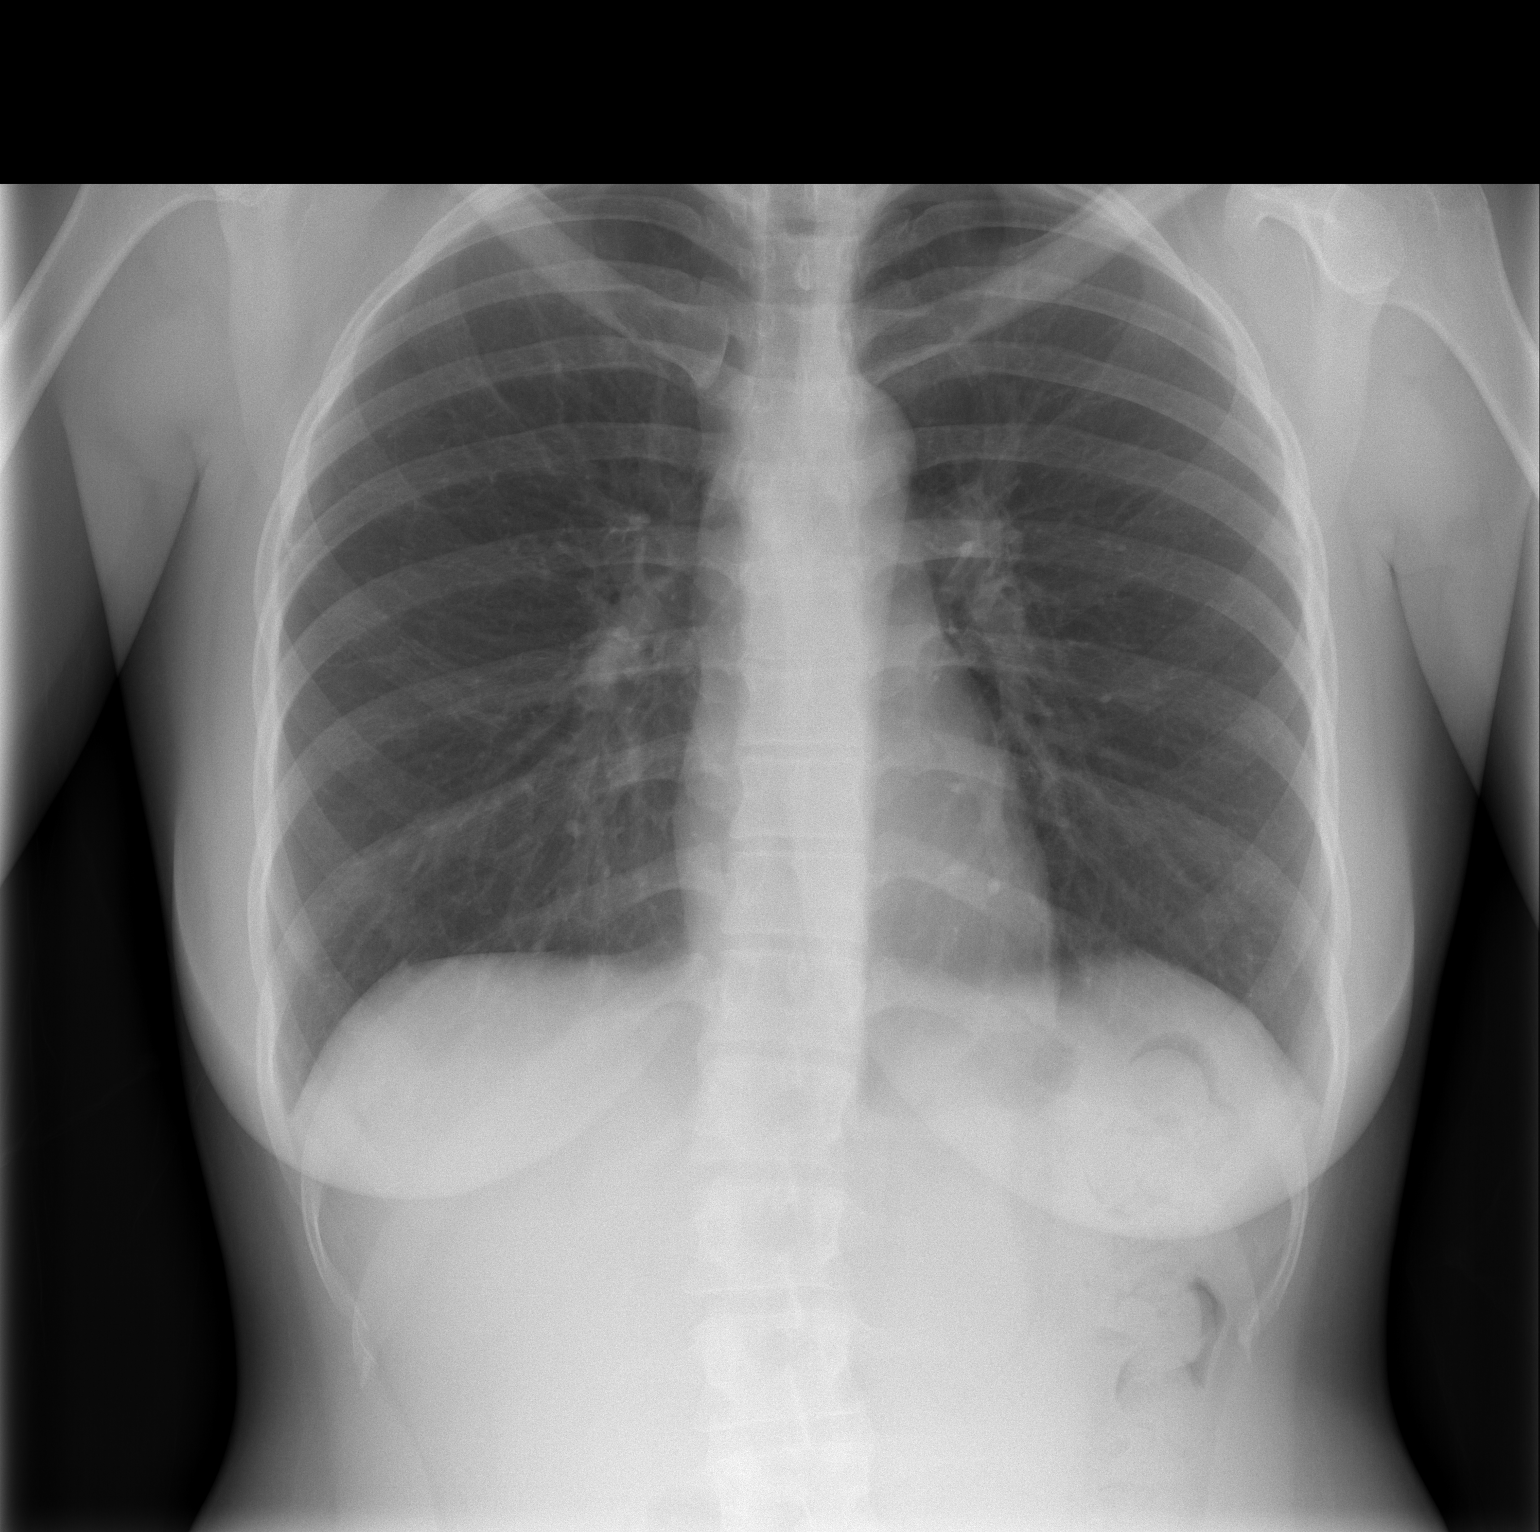

[w chest lat]
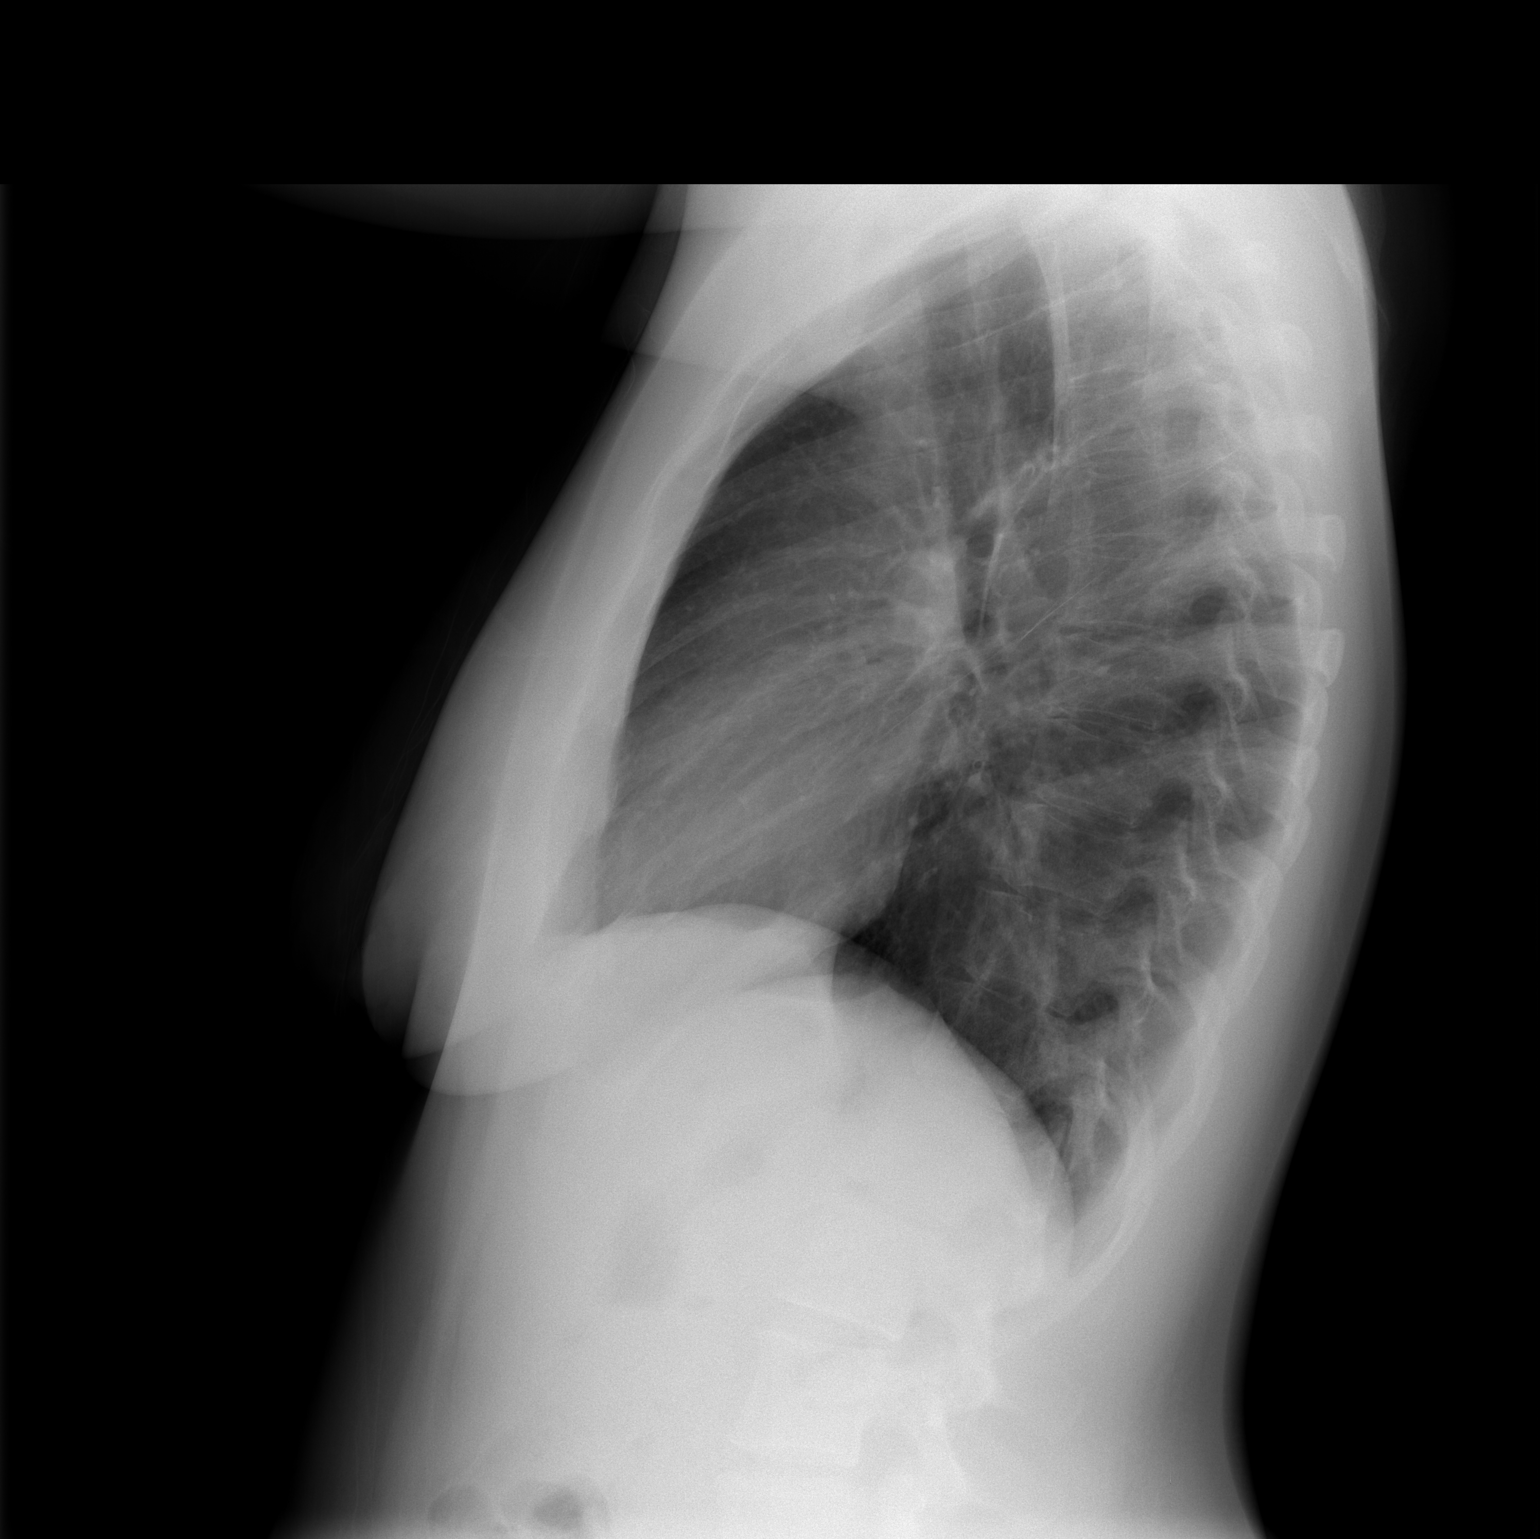

[2 of 2 positions shown; findings below may reference images not displayed]

FINDINGS: The heart size and mediastinal contours are within normal limits.  Both lungs are clear.  The visualized skeletal structures are unremarkable.
IMPRESSION: No active cardiopulmonary disease.

## 2007-03-11 ENCOUNTER — Emergency Department (HOSPITAL_COMMUNITY): Admission: EM | Admit: 2007-03-11 | Discharge: 2007-03-11 | Payer: Self-pay | Admitting: Family Medicine

## 2007-03-20 ENCOUNTER — Emergency Department (HOSPITAL_COMMUNITY): Admission: EM | Admit: 2007-03-20 | Discharge: 2007-03-20 | Payer: Self-pay | Admitting: Family Medicine

## 2007-03-27 IMAGING — CT CT HEAD W/O CM
1 series · 16 of 30 positions shown, 20 images · IV contrast (agent unspecified)
Comparison: Comparison to a report from 07/02/02.

CLINICAL DATA: Seizure.
 HEAD CT WITHOUT CONTRAST:
TECHNIQUE: Contiguous axial images were obtained from the base of the skull through the vertex according to standard protocol without contrast.

[Series 2: routinehead 4.8 h45s · axial · 0.45mm/px · z∈[-117,+13]mm · 16 of 30 slices shown, 20 images]
[im 2/30  brain]
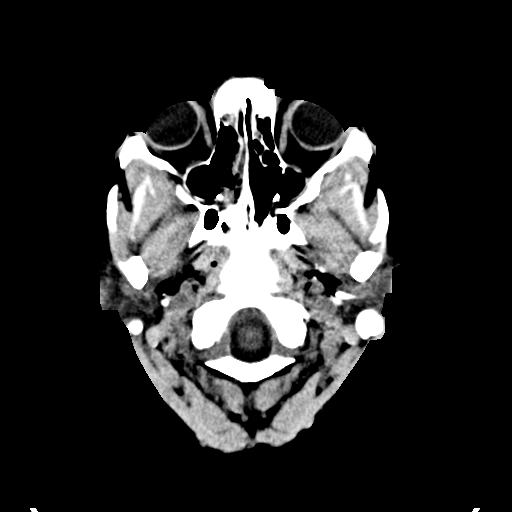
[im 2/30  bone]
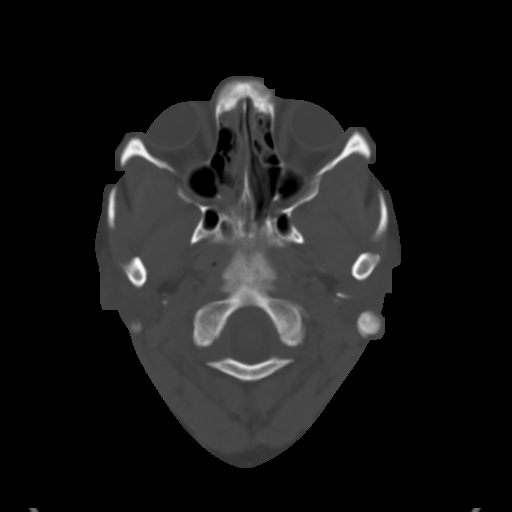
[im 4/30  brain]
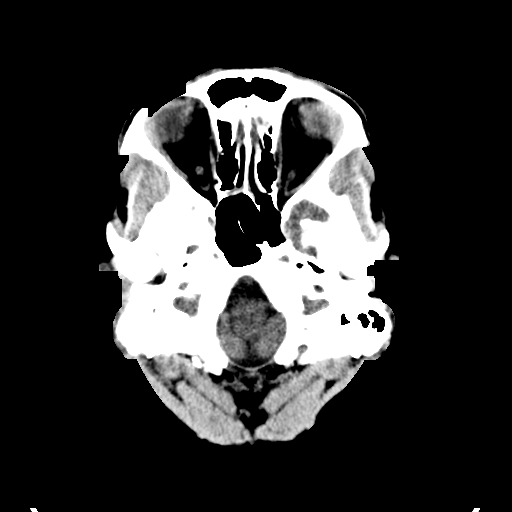
[im 6/30  brain]
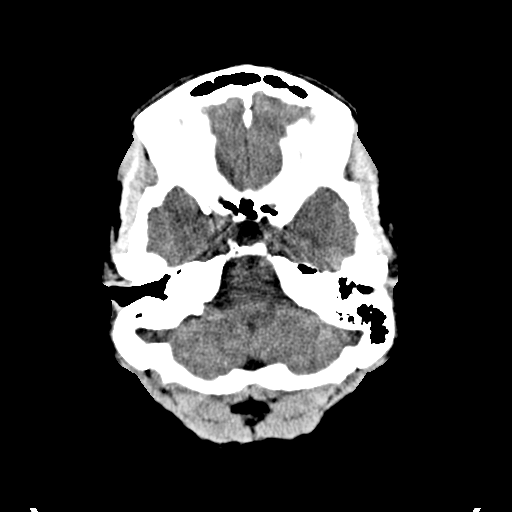
[im 8/30  brain]
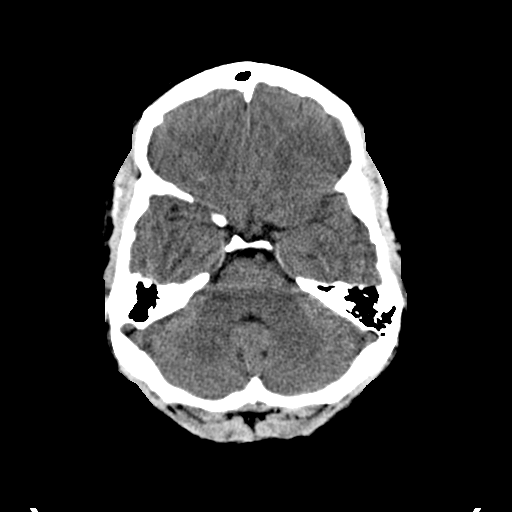
[im 9/30  brain]
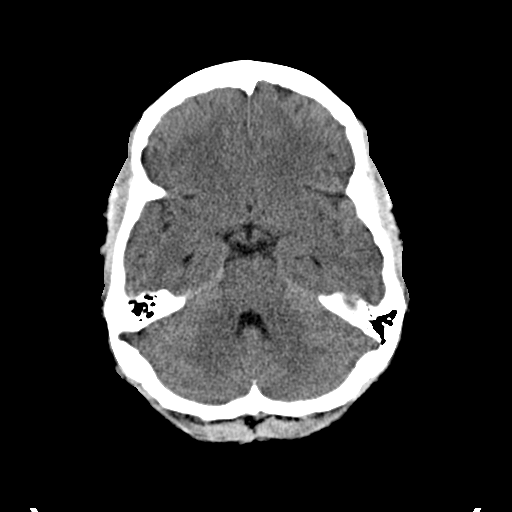
[im 9/30  bone]
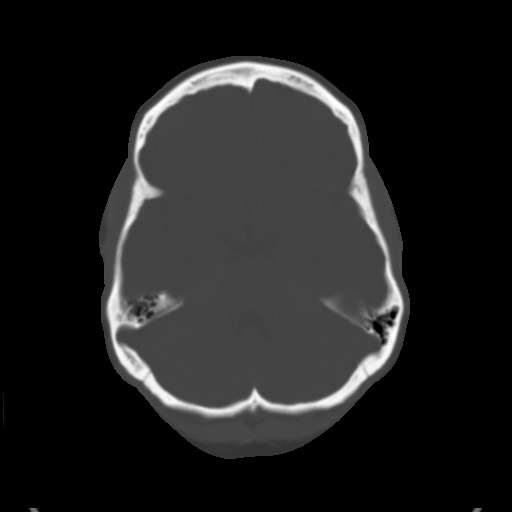
[im 11/30  brain]
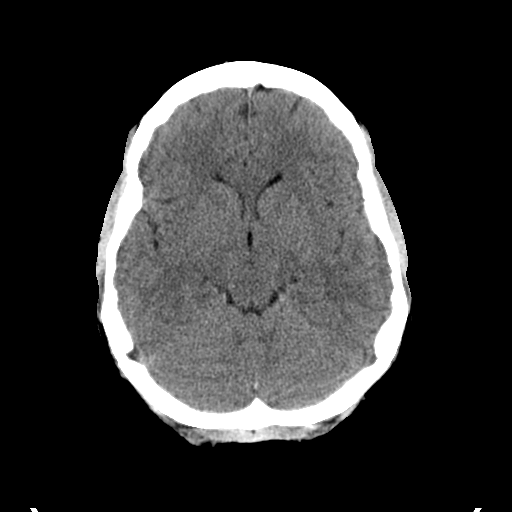
[im 13/30  brain]
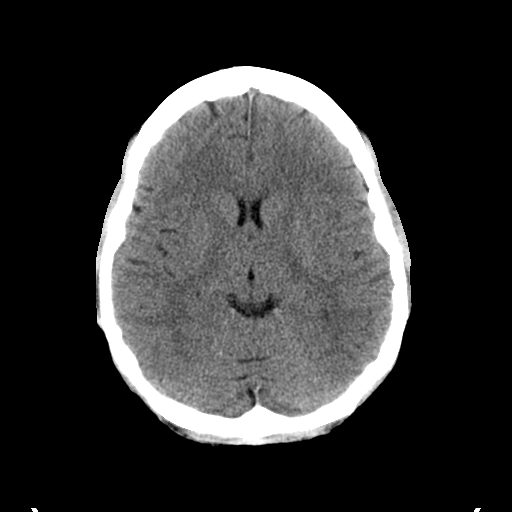
[im 15/30  brain]
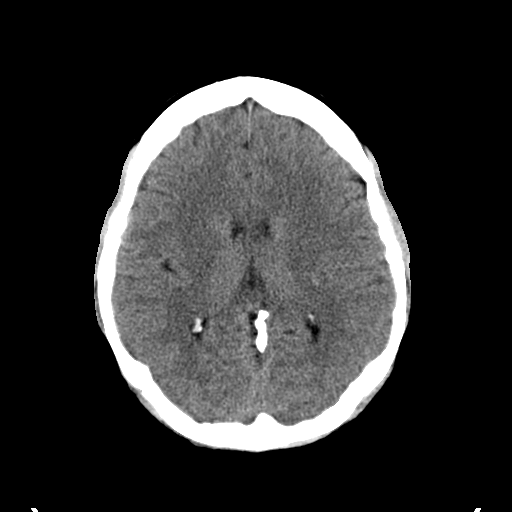
[im 16/30  brain]
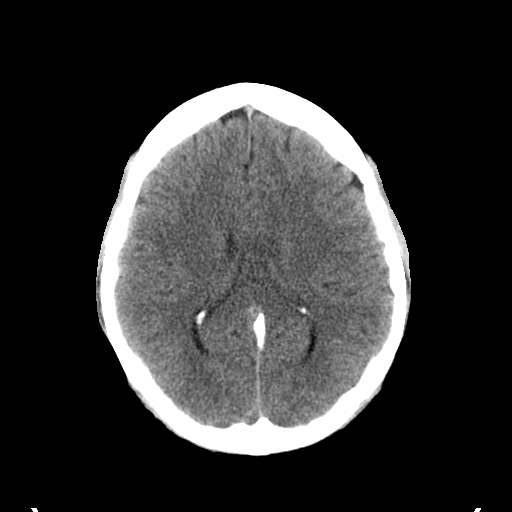
[im 16/30  bone]
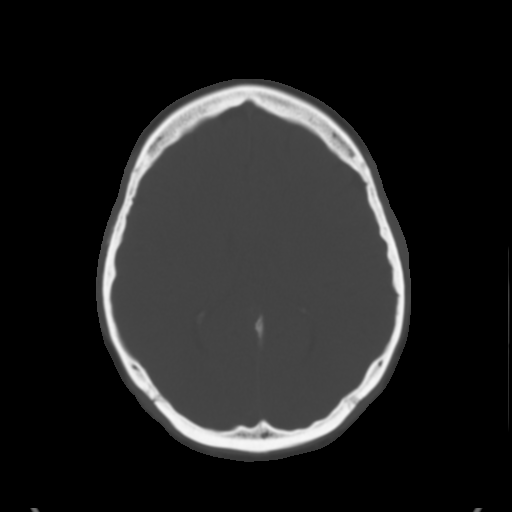
[im 18/30  brain]
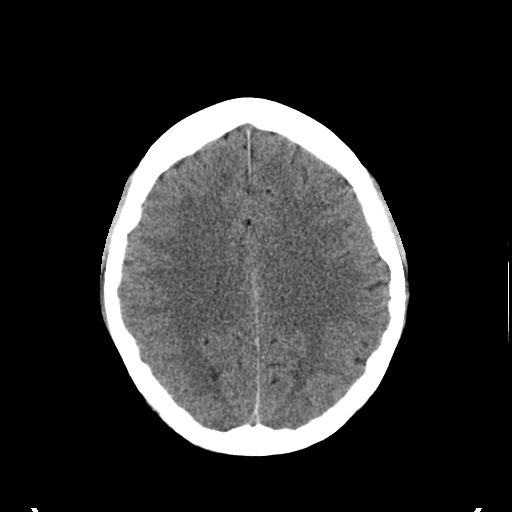
[im 20/30  brain]
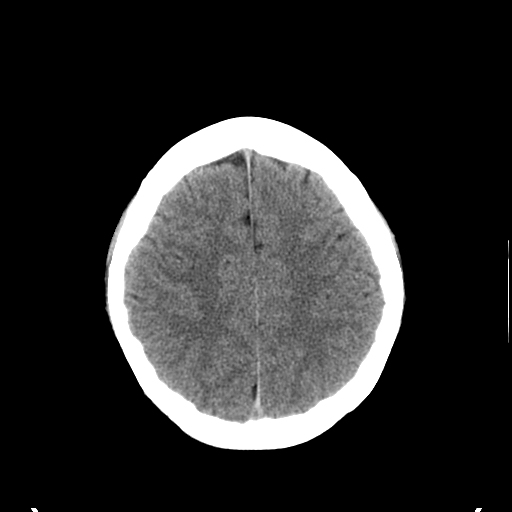
[im 22/30  brain]
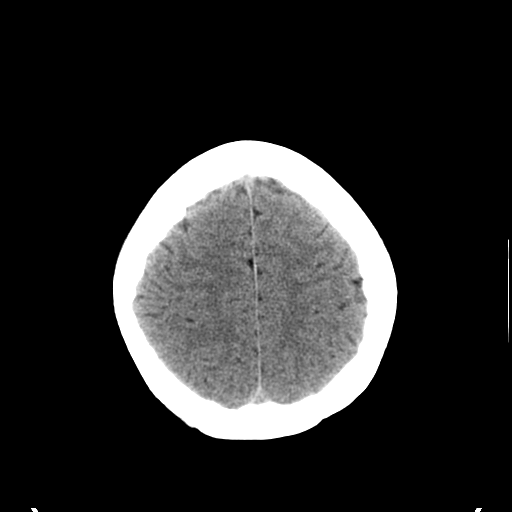
[im 23/30  brain]
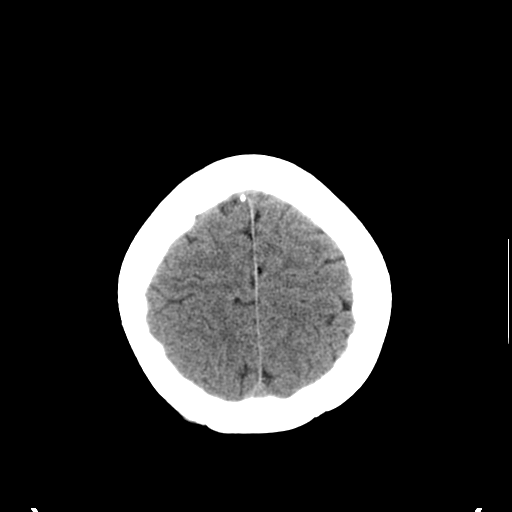
[im 23/30  bone]
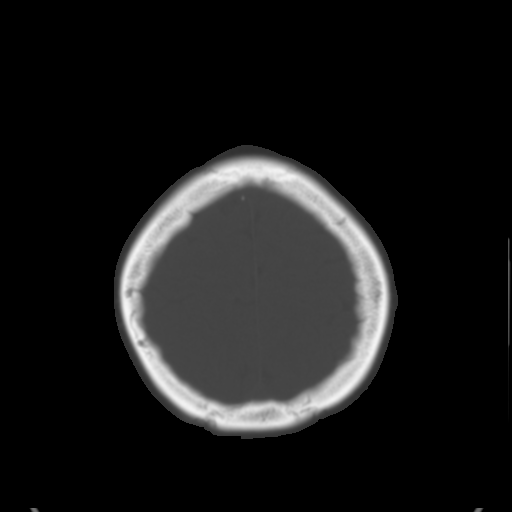
[im 25/30  brain]
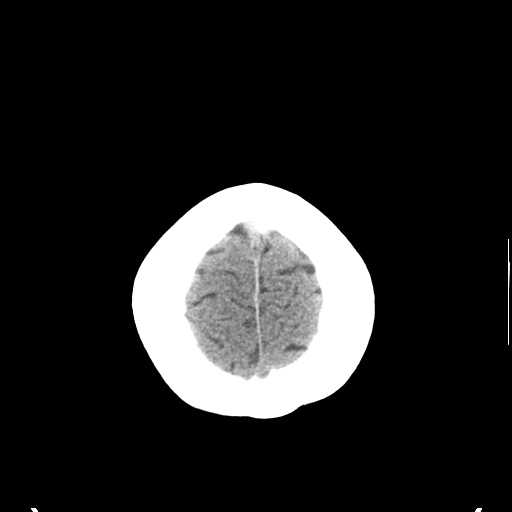
[im 27/30  brain]
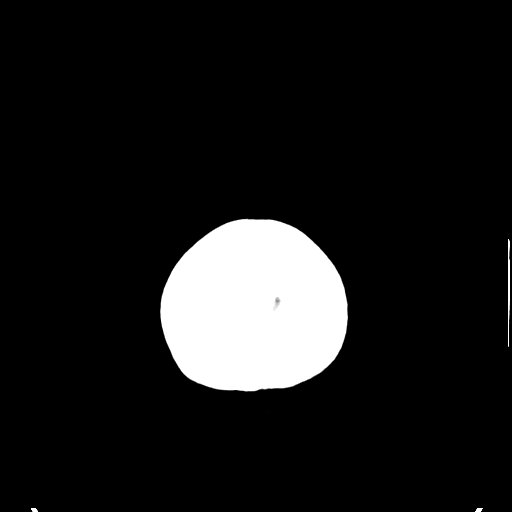
[im 29/30  brain]
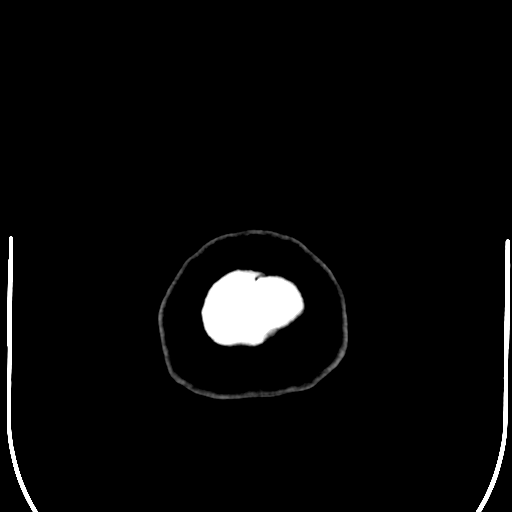

[16 of 30 positions shown; findings below may reference images not displayed]

FINDINGS: The brain has a normal appearance without evidence of atrophy, stroke, mass, hemorrhage, hydrocephalus, or extraaxial collection.   The calvarium is unremarkable.   The paranasal sinuses, middle ears, and mastoids show no significant disease.   There are some minor mucosal inflammatory changes in the ethmoid region.
IMPRESSION: Negative head CT.

## 2007-04-10 ENCOUNTER — Emergency Department (HOSPITAL_COMMUNITY): Admission: EM | Admit: 2007-04-10 | Discharge: 2007-04-10 | Payer: Self-pay | Admitting: Family Medicine

## 2007-04-17 IMAGING — CT CT HEAD W/O CM
2 series · 10 of 14 positions shown, 12 images · IV contrast (agent unspecified)
Comparison: 07/29/06.

CLINICAL DATA: Severe headache.  Nausea and vomiting.  History of seizures. 
 HEAD CT WITHOUT CONTRAST:
TECHNIQUE: Contiguous axial images were obtained from the base of the skull through the vertex according to standard protocol without contrast.

[Series 2: brain · axial · 0.47mm/px · z∈[+163,+257]mm · 5 of 28 slices shown, 7 images]
[im 5/28  soft-tissue]
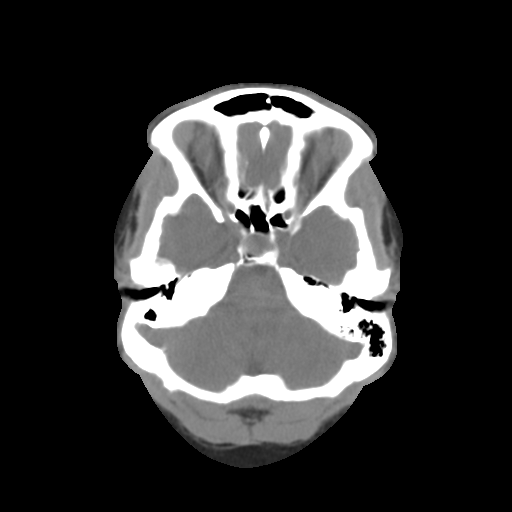
[im 5/28  bone]
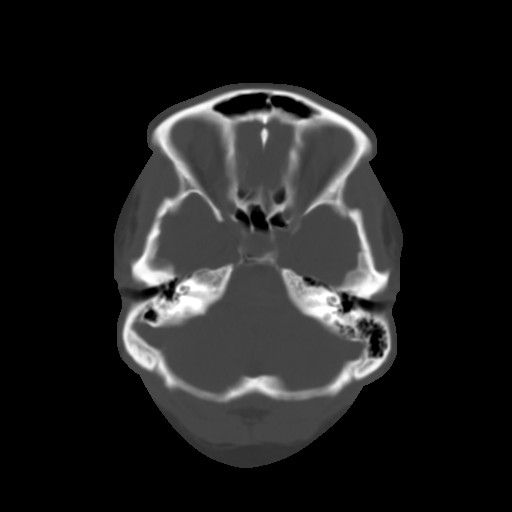
[im 10/28  bone]
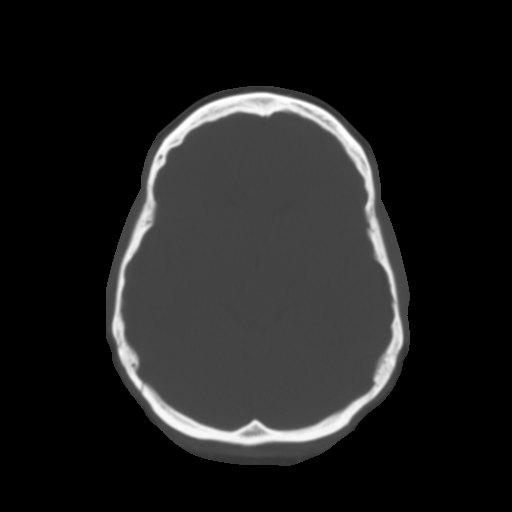
[im 14/28  bone]
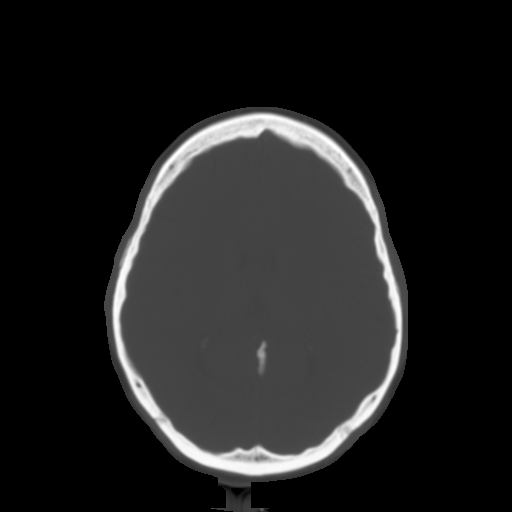
[im 19/28  bone]
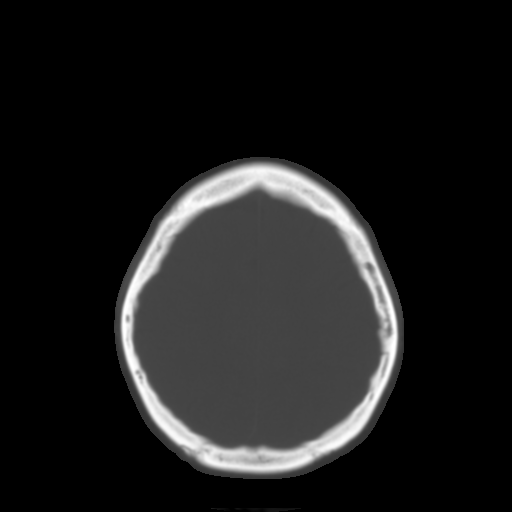
[im 23/28  soft-tissue]
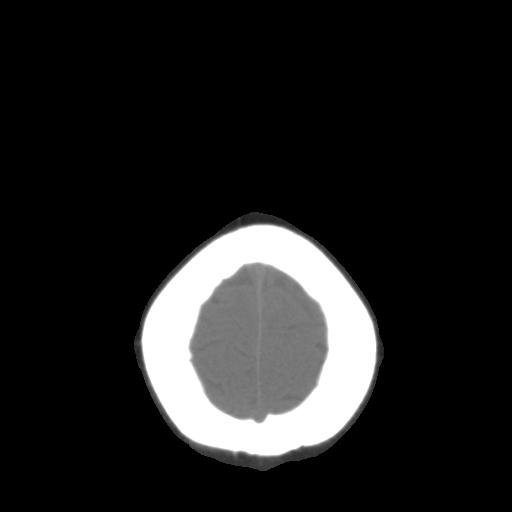
[im 23/28  bone]
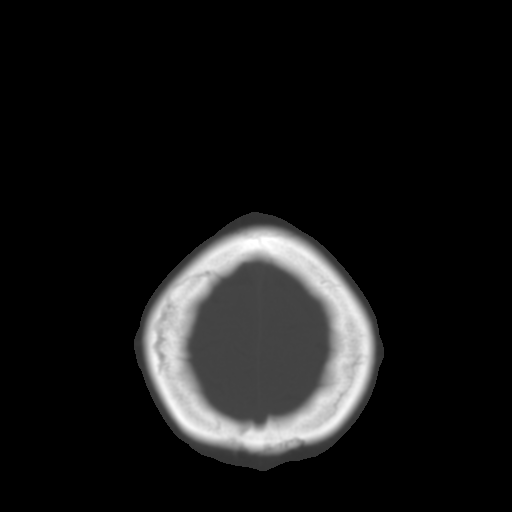

[Series 3: recon 2: brain · axial · 0.47mm/px · z∈[+163,+257]mm · 5 of 28 slices shown]
[im 5/28  bone]
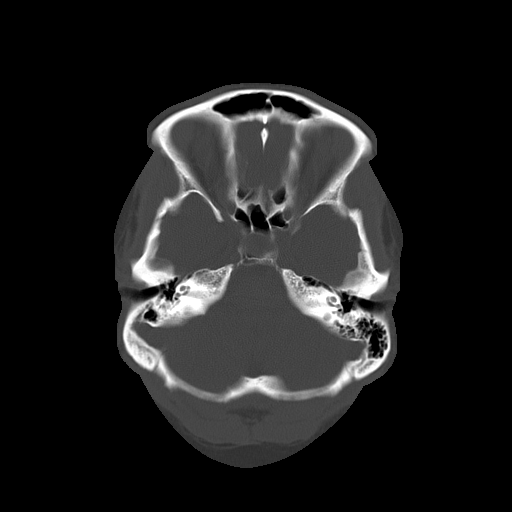
[im 10/28  bone]
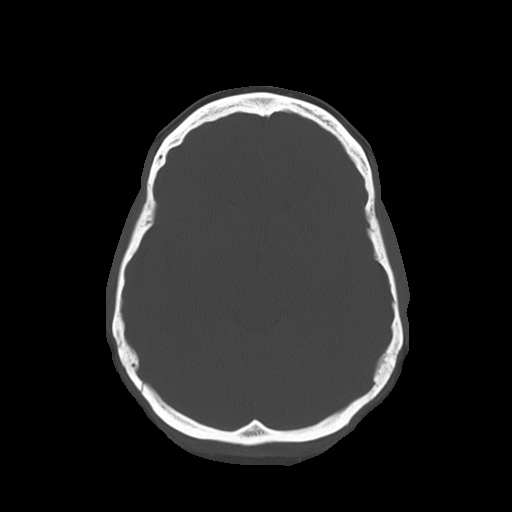
[im 14/28  bone]
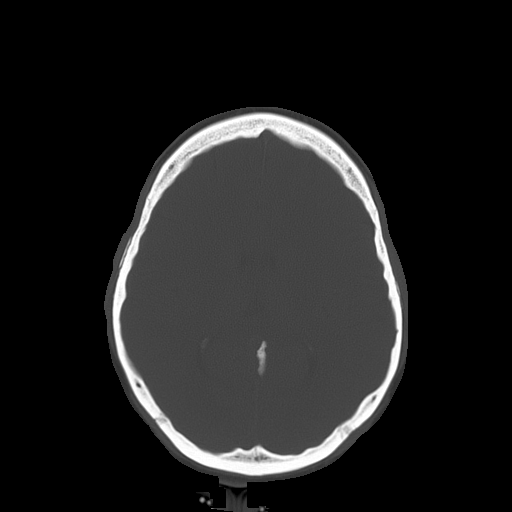
[im 19/28  bone]
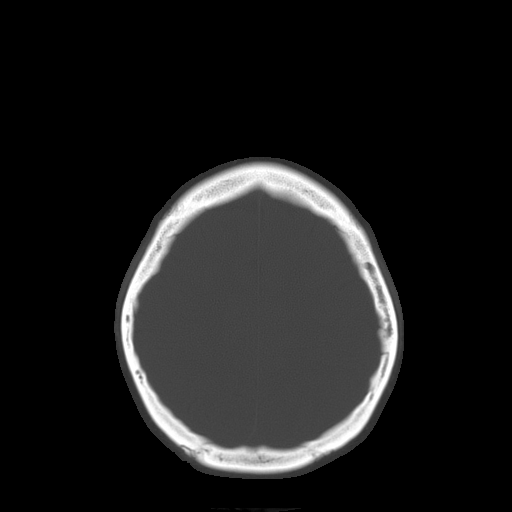
[im 23/28  bone]
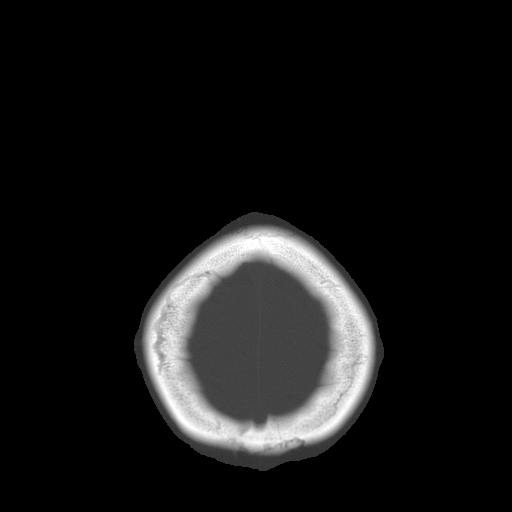

[10 of 14 positions shown; findings below may reference images not displayed]

FINDINGS: Again, the brain has a normal appearance without evidence of atrophy, stroke, mass, hemorrhage, hydrocephalus, or extraaxial collection.  The calvarium is unremarkable.   The paranasal sinuses, middle ears, and mastoids are clear.
IMPRESSION: Normal head CT.

## 2007-05-08 ENCOUNTER — Emergency Department (HOSPITAL_COMMUNITY): Admission: EM | Admit: 2007-05-08 | Discharge: 2007-05-08 | Payer: Self-pay | Admitting: Emergency Medicine

## 2007-06-05 ENCOUNTER — Other Ambulatory Visit: Admission: RE | Admit: 2007-06-05 | Discharge: 2007-06-05 | Payer: Self-pay | Admitting: Gynecology

## 2007-06-06 ENCOUNTER — Emergency Department (HOSPITAL_COMMUNITY): Admission: EM | Admit: 2007-06-06 | Discharge: 2007-06-06 | Payer: Self-pay | Admitting: Emergency Medicine

## 2007-06-14 ENCOUNTER — Inpatient Hospital Stay (HOSPITAL_COMMUNITY): Admission: AD | Admit: 2007-06-14 | Discharge: 2007-06-14 | Payer: Self-pay | Admitting: Obstetrics & Gynecology

## 2007-07-01 ENCOUNTER — Emergency Department (HOSPITAL_COMMUNITY): Admission: EM | Admit: 2007-07-01 | Discharge: 2007-07-01 | Payer: Self-pay | Admitting: *Deleted

## 2007-07-02 ENCOUNTER — Emergency Department (HOSPITAL_COMMUNITY): Admission: EM | Admit: 2007-07-02 | Discharge: 2007-07-02 | Payer: Self-pay | Admitting: Emergency Medicine

## 2007-07-04 ENCOUNTER — Emergency Department (HOSPITAL_COMMUNITY): Admission: EM | Admit: 2007-07-04 | Discharge: 2007-07-04 | Payer: Self-pay | Admitting: Emergency Medicine

## 2007-07-29 ENCOUNTER — Emergency Department (HOSPITAL_COMMUNITY): Admission: EM | Admit: 2007-07-29 | Discharge: 2007-07-29 | Payer: Self-pay | Admitting: Emergency Medicine

## 2007-08-10 ENCOUNTER — Emergency Department (HOSPITAL_COMMUNITY): Admission: EM | Admit: 2007-08-10 | Discharge: 2007-08-10 | Payer: Self-pay | Admitting: Emergency Medicine

## 2007-08-21 ENCOUNTER — Emergency Department (HOSPITAL_COMMUNITY): Admission: EM | Admit: 2007-08-21 | Discharge: 2007-08-21 | Payer: Self-pay | Admitting: Emergency Medicine

## 2007-08-28 ENCOUNTER — Emergency Department (HOSPITAL_COMMUNITY): Admission: EM | Admit: 2007-08-28 | Discharge: 2007-08-28 | Payer: Self-pay | Admitting: Emergency Medicine

## 2007-09-05 ENCOUNTER — Emergency Department (HOSPITAL_COMMUNITY): Admission: EM | Admit: 2007-09-05 | Discharge: 2007-09-05 | Payer: Self-pay | Admitting: Emergency Medicine

## 2007-09-16 IMAGING — CR DG CHEST 2V
2 series · 2 of 2 positions shown · non-contrast
Comparison: 06/20/06.

CLINICAL DATA: Cough, fever, cold, shortness of breath. 
 CHEST - 2 VIEW:

[w chest pa]
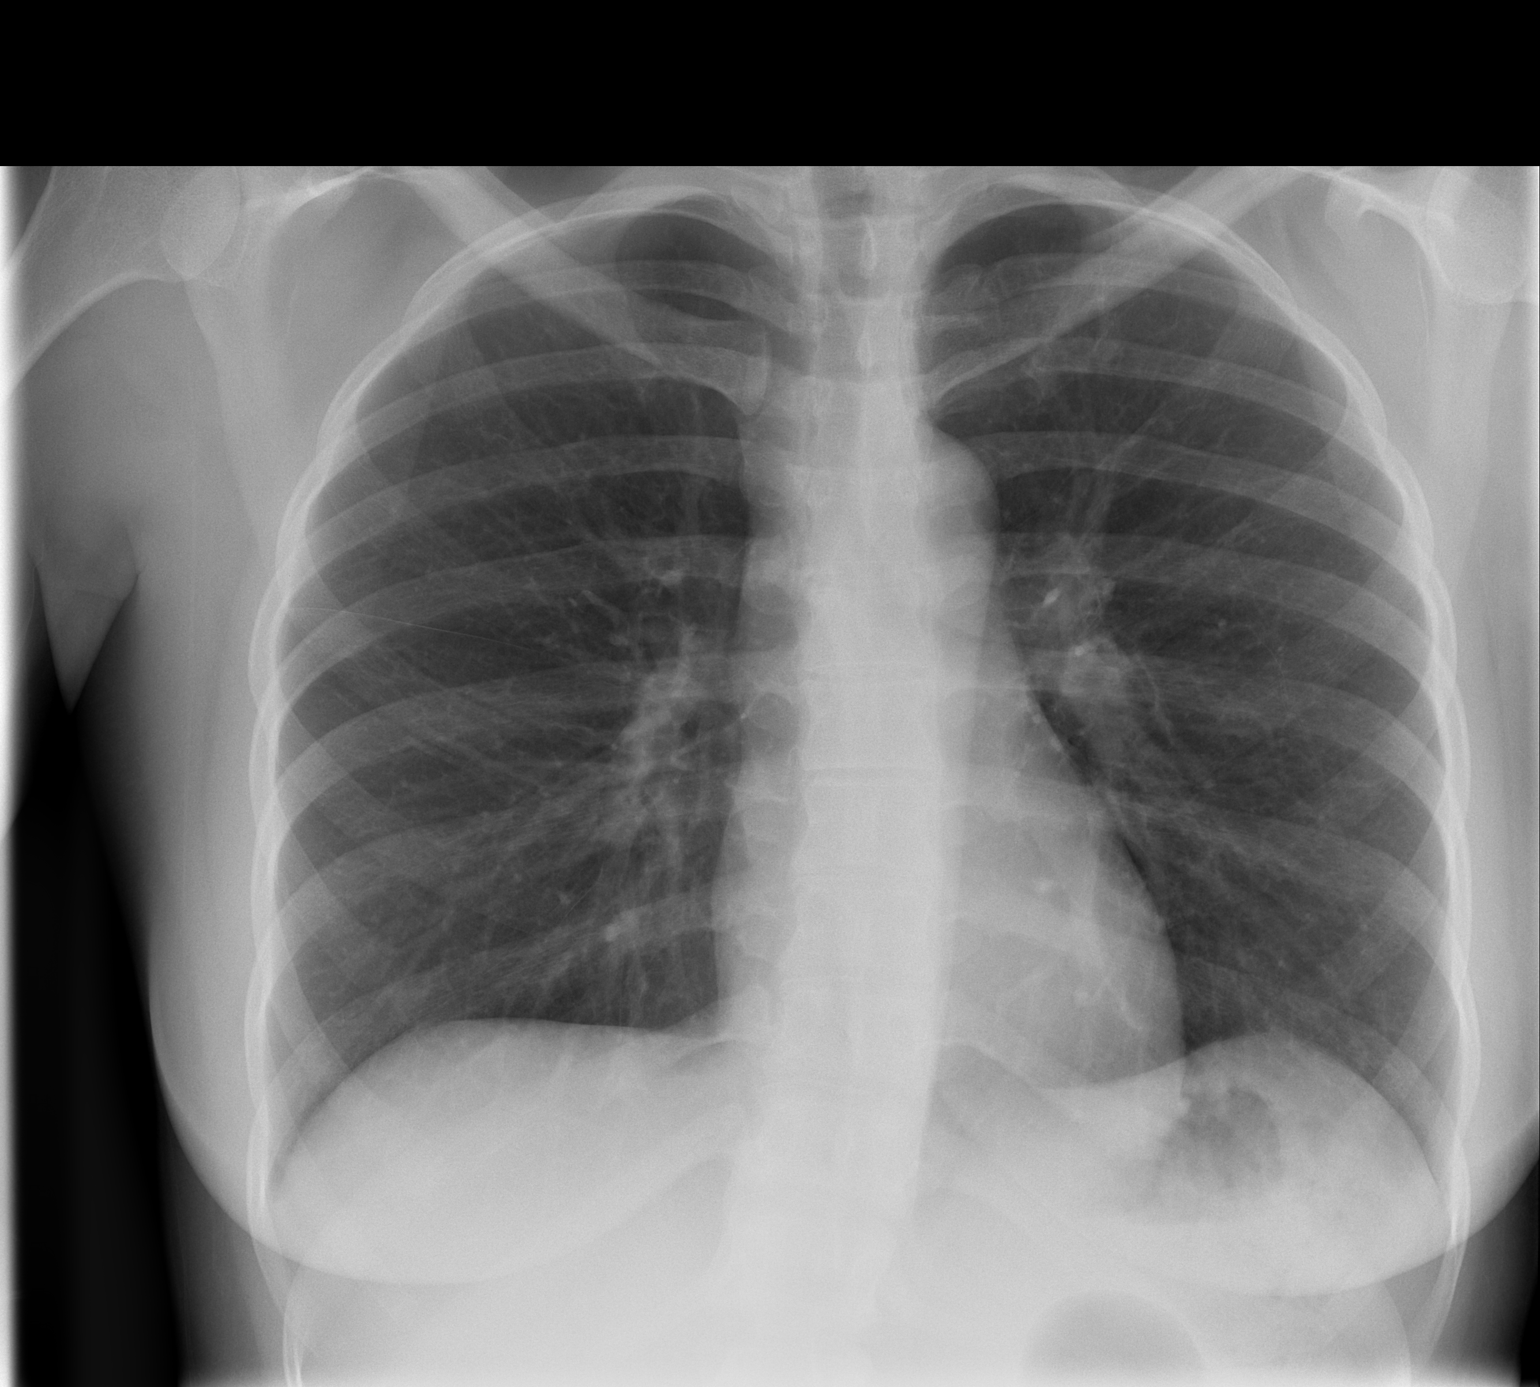

[w chest lat]
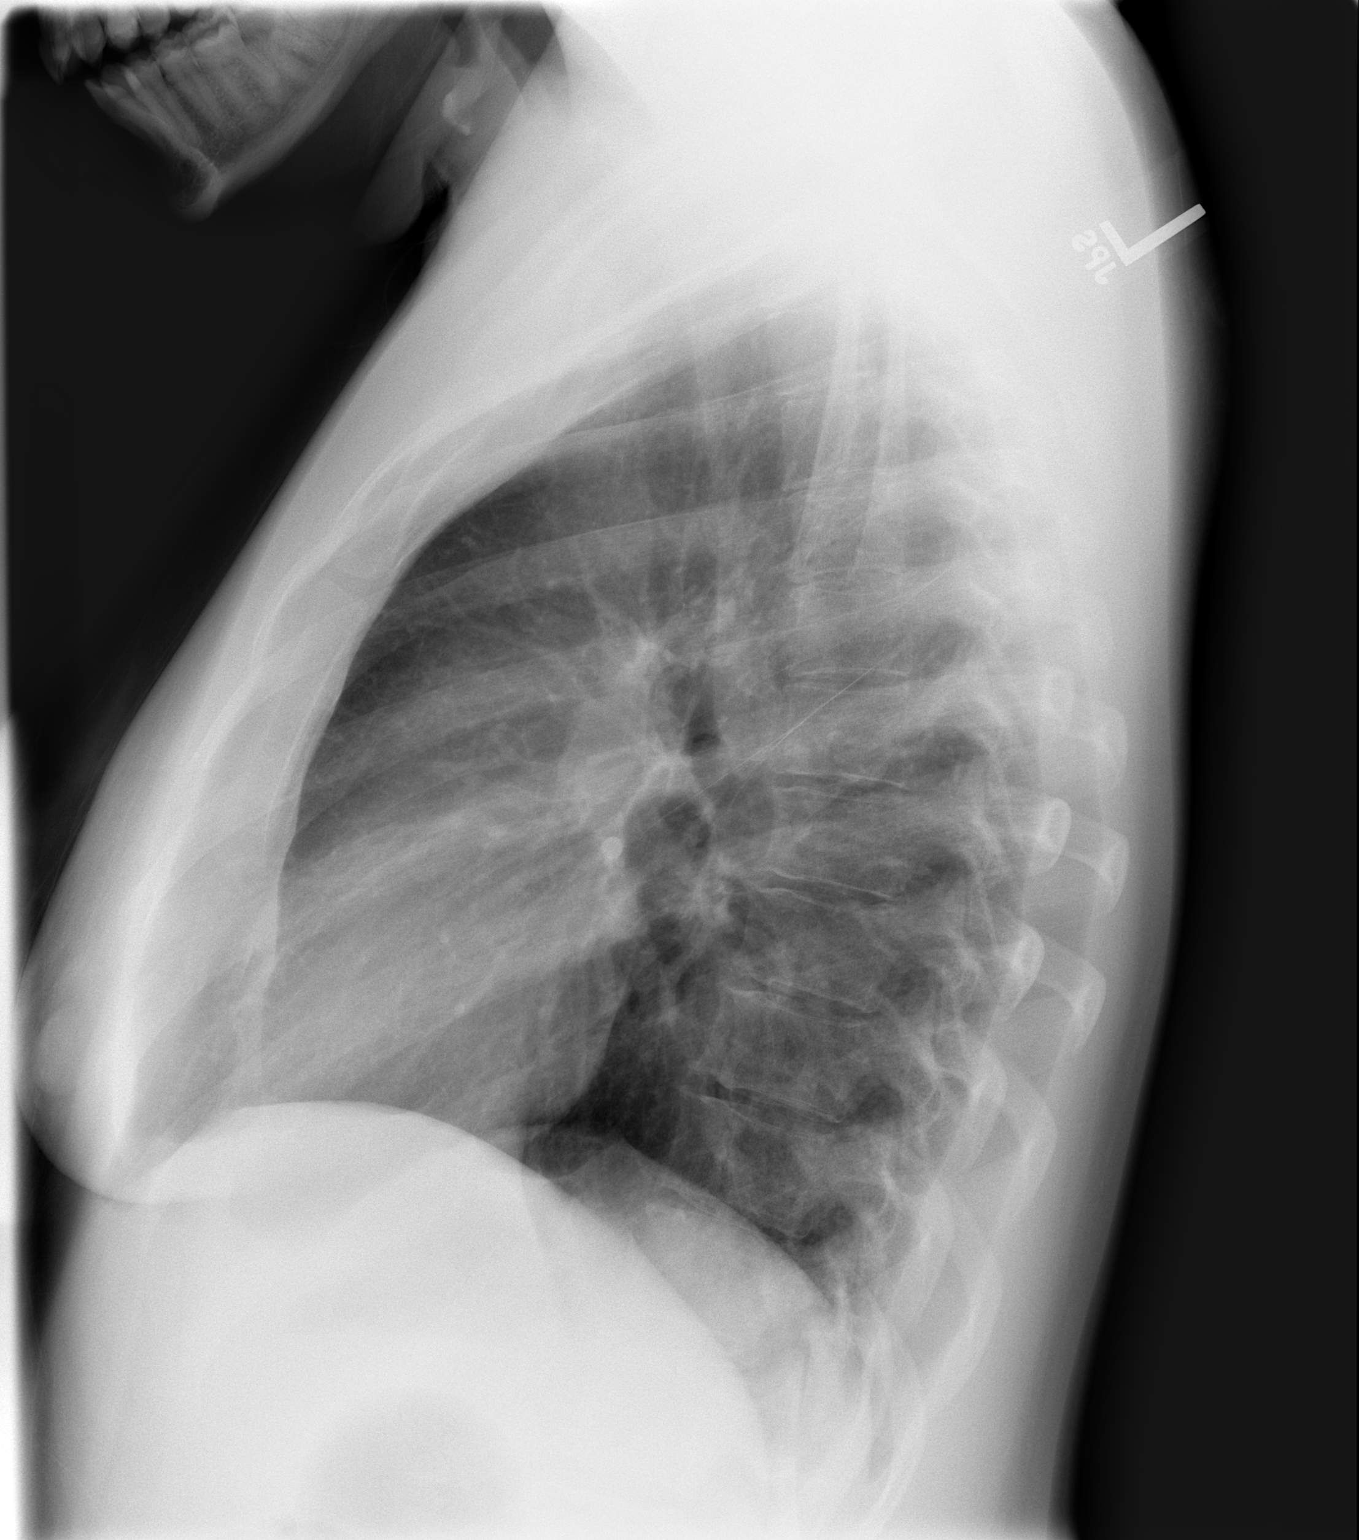

[2 of 2 positions shown; findings below may reference images not displayed]

FINDINGS: The heart size and mediastinal contours are within normal limits.  Both lungs are clear.  The visualized skeletal structures are unremarkable.
IMPRESSION: No active cardiopulmonary disease.

## 2007-09-24 ENCOUNTER — Emergency Department (HOSPITAL_COMMUNITY): Admission: EM | Admit: 2007-09-24 | Discharge: 2007-09-24 | Payer: Self-pay | Admitting: Emergency Medicine

## 2007-09-26 ENCOUNTER — Emergency Department (HOSPITAL_COMMUNITY): Admission: EM | Admit: 2007-09-26 | Discharge: 2007-09-26 | Payer: Self-pay | Admitting: Emergency Medicine

## 2007-09-28 ENCOUNTER — Inpatient Hospital Stay (HOSPITAL_COMMUNITY): Admission: EM | Admit: 2007-09-28 | Discharge: 2007-10-03 | Payer: Self-pay | Admitting: Emergency Medicine

## 2007-09-28 ENCOUNTER — Emergency Department (HOSPITAL_COMMUNITY): Admission: EM | Admit: 2007-09-28 | Discharge: 2007-09-28 | Payer: Self-pay | Admitting: Emergency Medicine

## 2007-11-16 IMAGING — CR DG KNEE COMPLETE 4+V*R*
4 series · 4 of 4 positions shown · non-contrast
Comparison: None.

CLINICAL DATA: Right knee pain for 2 [DATE] weeks.
 RIGHT KNEE - 4 VIEW:

[view not recorded (1 of 4)]
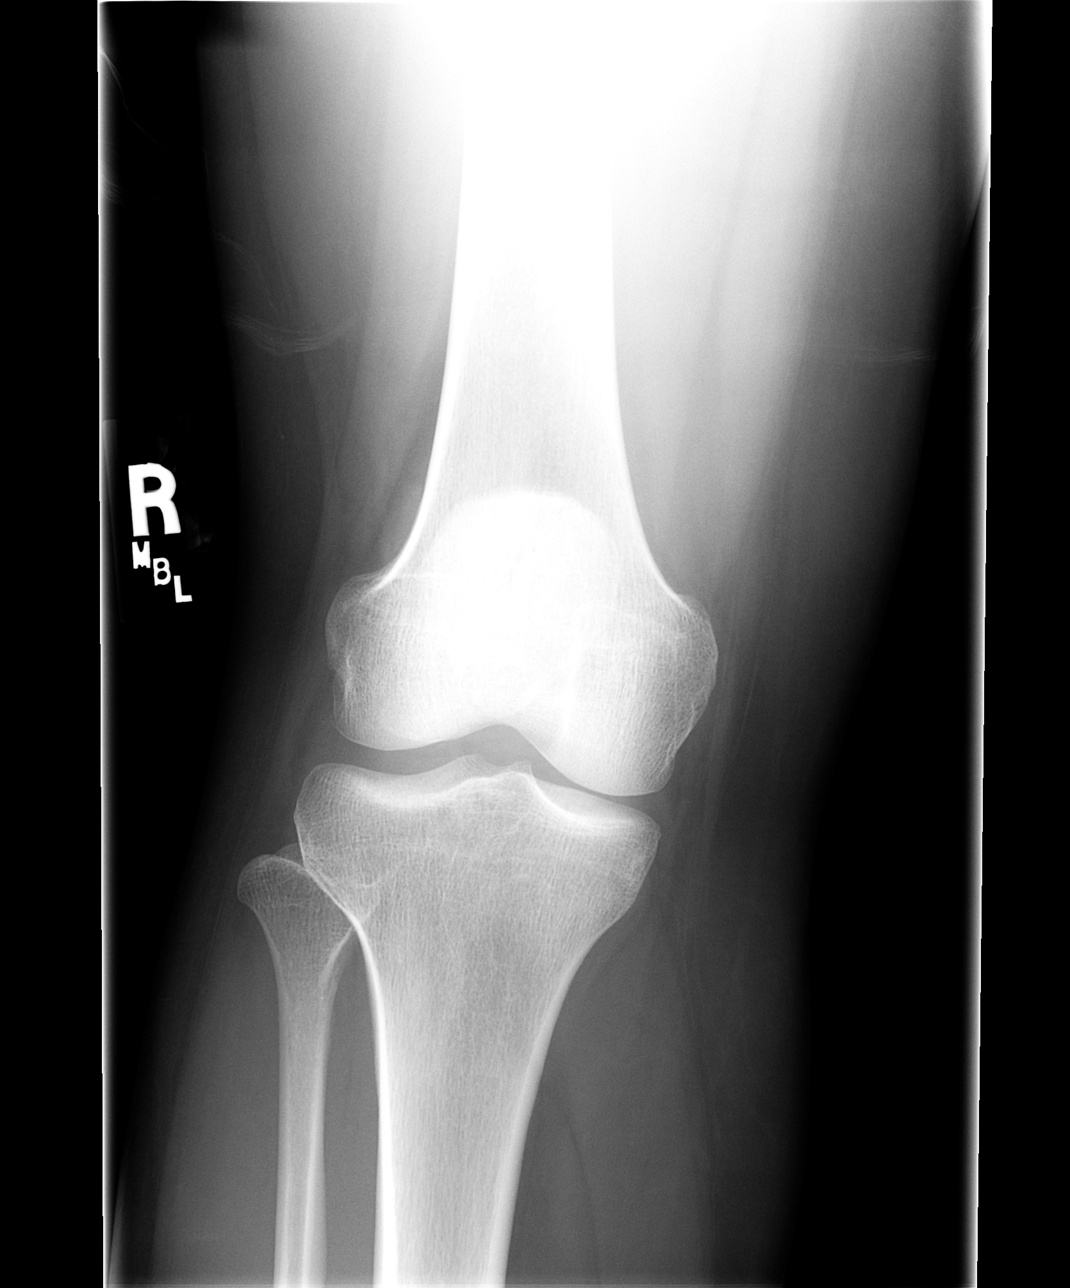

[view not recorded (2 of 4)]
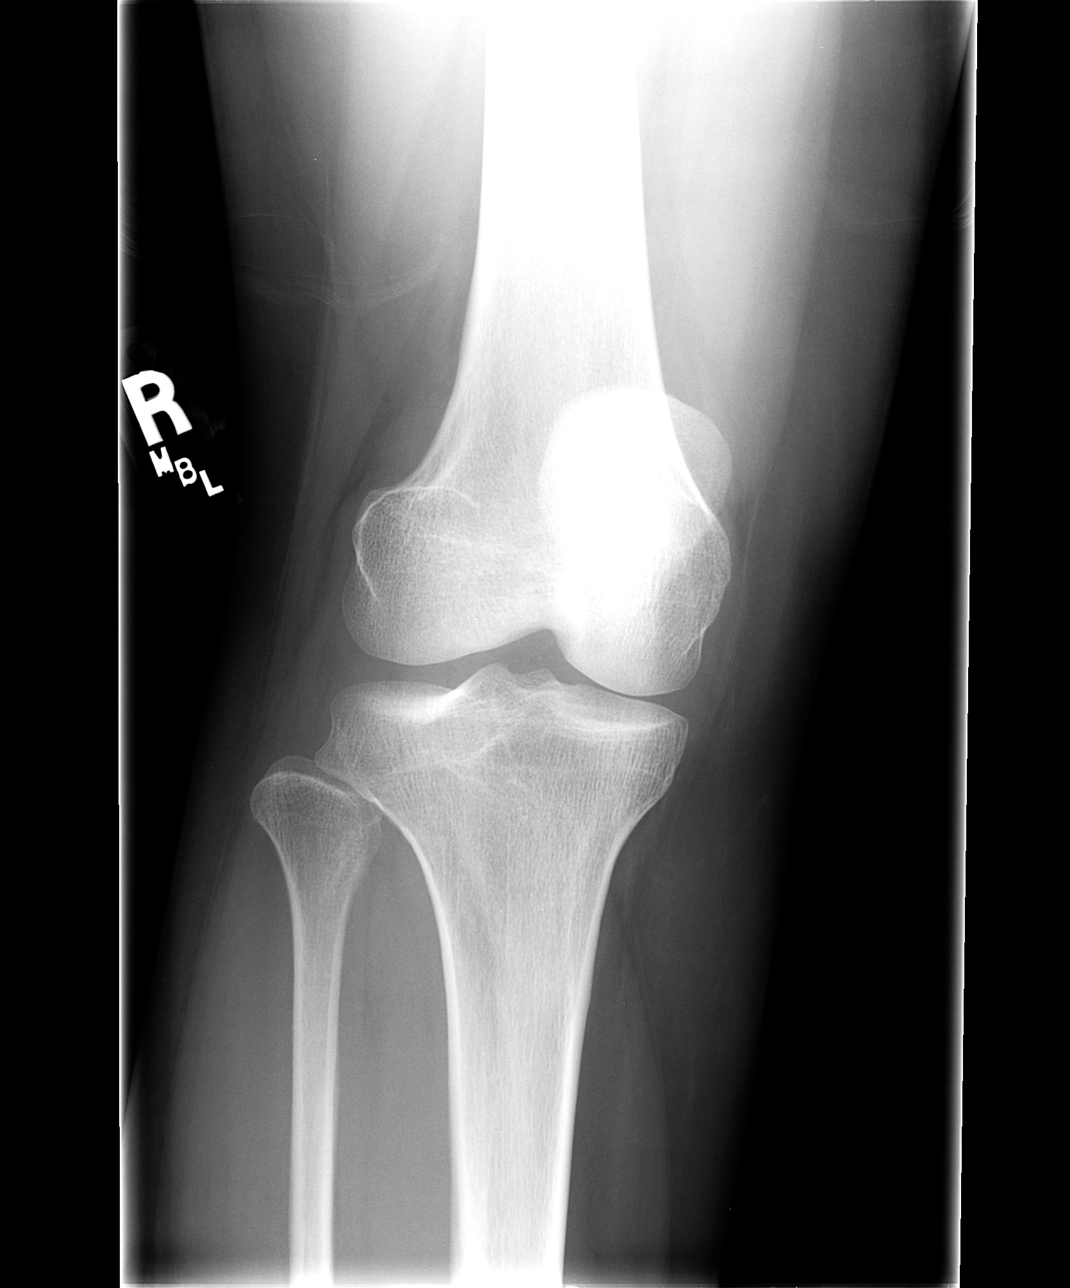

[view not recorded (3 of 4)]
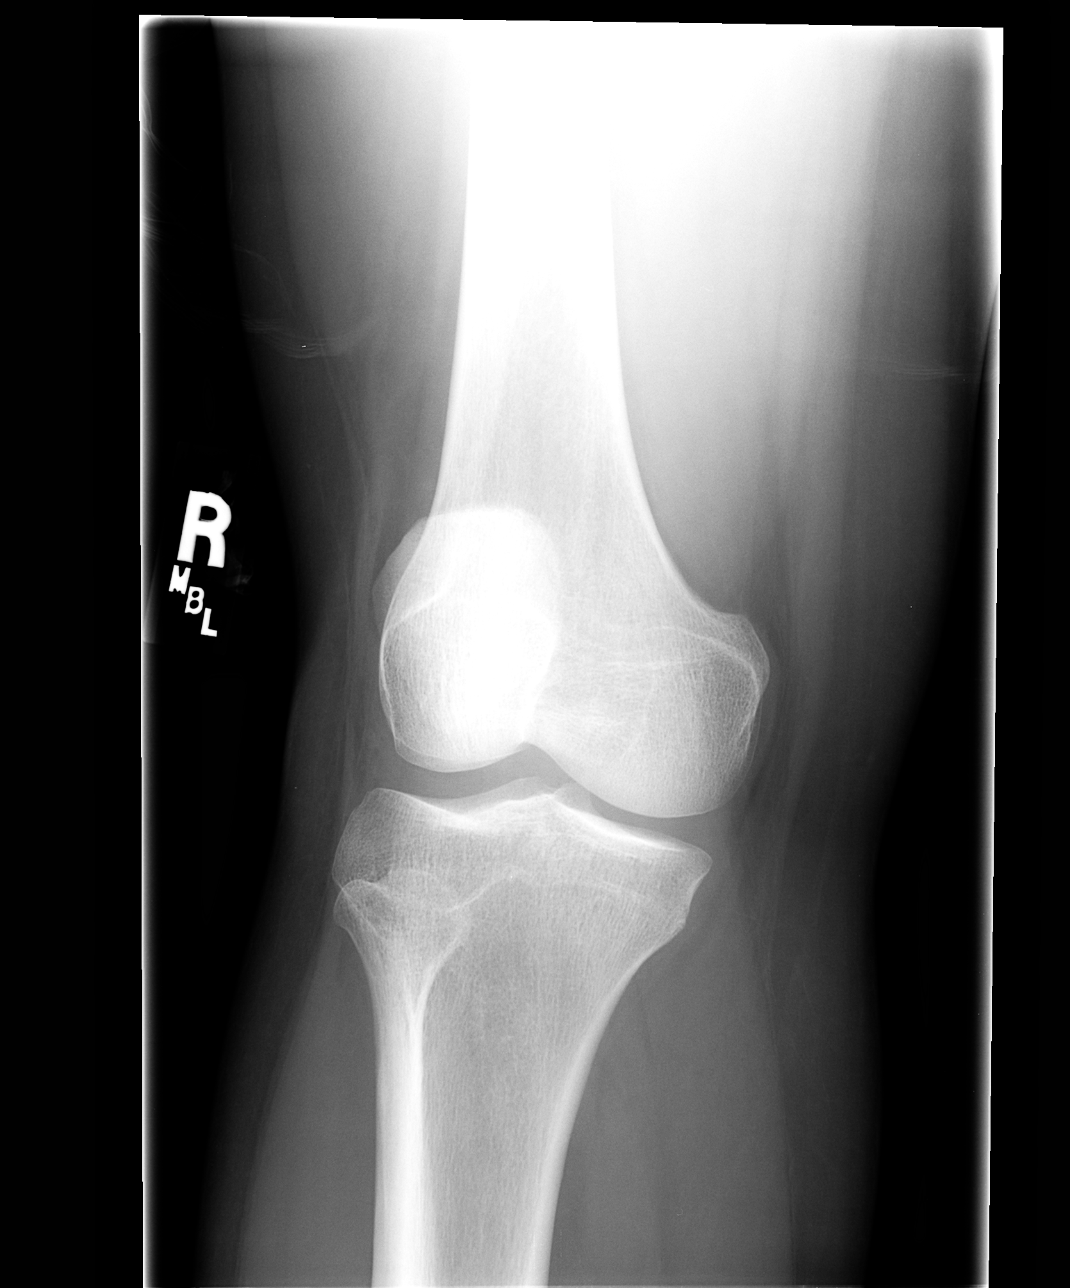

[view not recorded (4 of 4)]
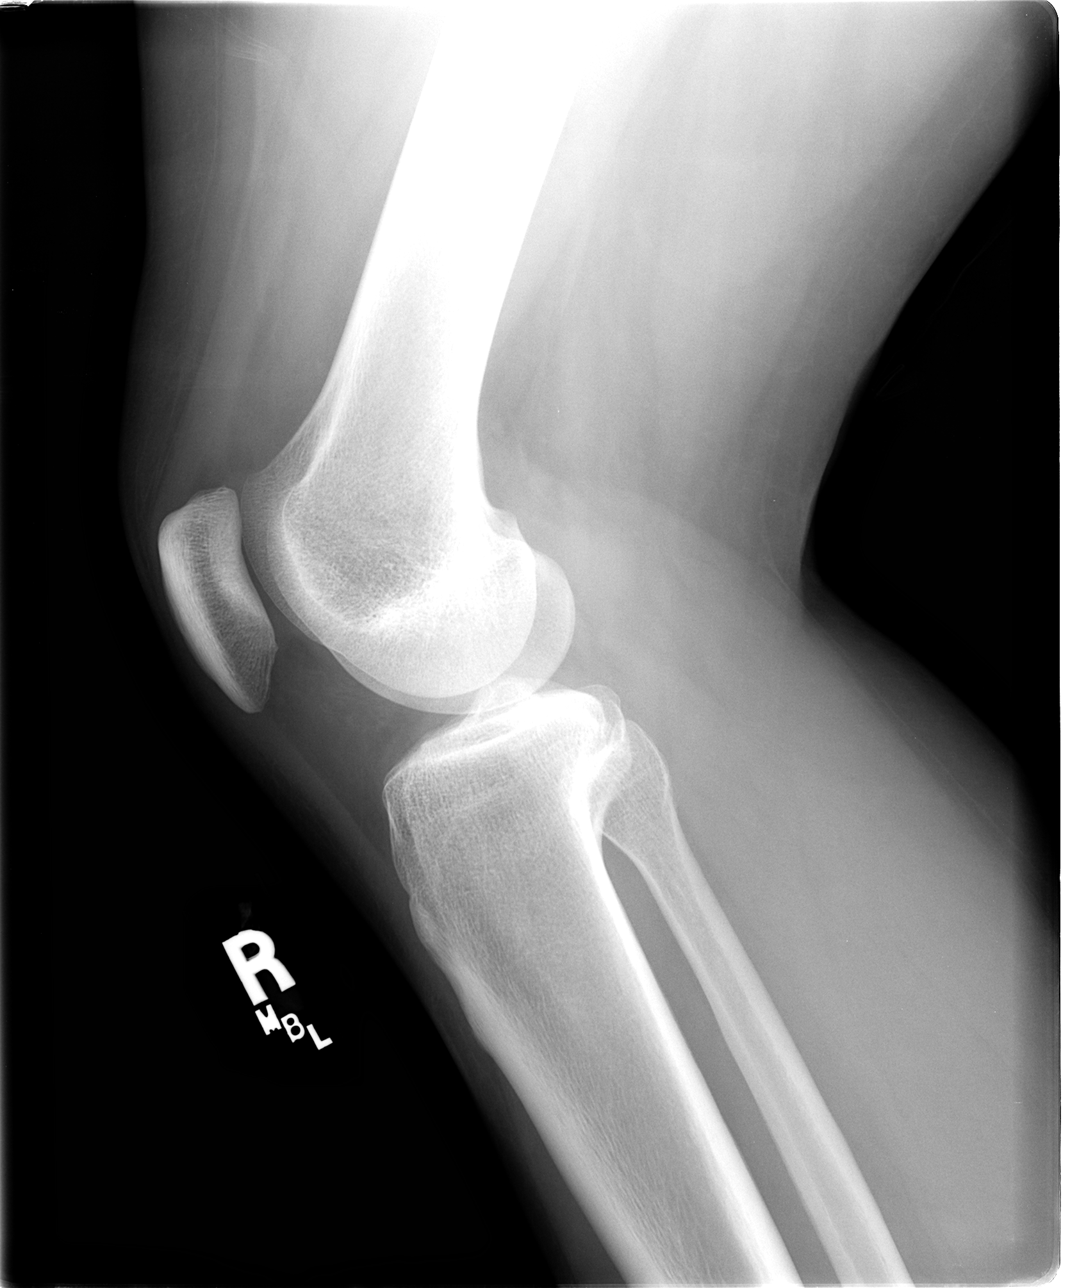

[4 of 4 positions shown; findings below may reference images not displayed]

FINDINGS: No joint effusion or fracture. No significant degenerative changes.
IMPRESSION: No acute or chronic findings.

## 2007-11-19 ENCOUNTER — Emergency Department (HOSPITAL_COMMUNITY): Admission: EM | Admit: 2007-11-19 | Discharge: 2007-11-20 | Payer: Self-pay | Admitting: Emergency Medicine

## 2007-12-01 ENCOUNTER — Emergency Department (HOSPITAL_COMMUNITY): Admission: EM | Admit: 2007-12-01 | Discharge: 2007-12-01 | Payer: Self-pay | Admitting: Emergency Medicine

## 2007-12-25 ENCOUNTER — Emergency Department (HOSPITAL_COMMUNITY): Admission: EM | Admit: 2007-12-25 | Discharge: 2007-12-25 | Payer: Self-pay | Admitting: Emergency Medicine

## 2007-12-27 ENCOUNTER — Emergency Department (HOSPITAL_COMMUNITY): Admission: EM | Admit: 2007-12-27 | Discharge: 2007-12-27 | Payer: Self-pay | Admitting: Family Medicine

## 2008-01-04 IMAGING — CR DG CHEST 2V
2 series · 2 of 2 positions shown · non-contrast
Comparison: 01/18/07.

CLINICAL DATA: Chest pain, cough, nasal congestion, and shortness of breath. 
 CHEST ? 2 VIEW:

[w chest pa]
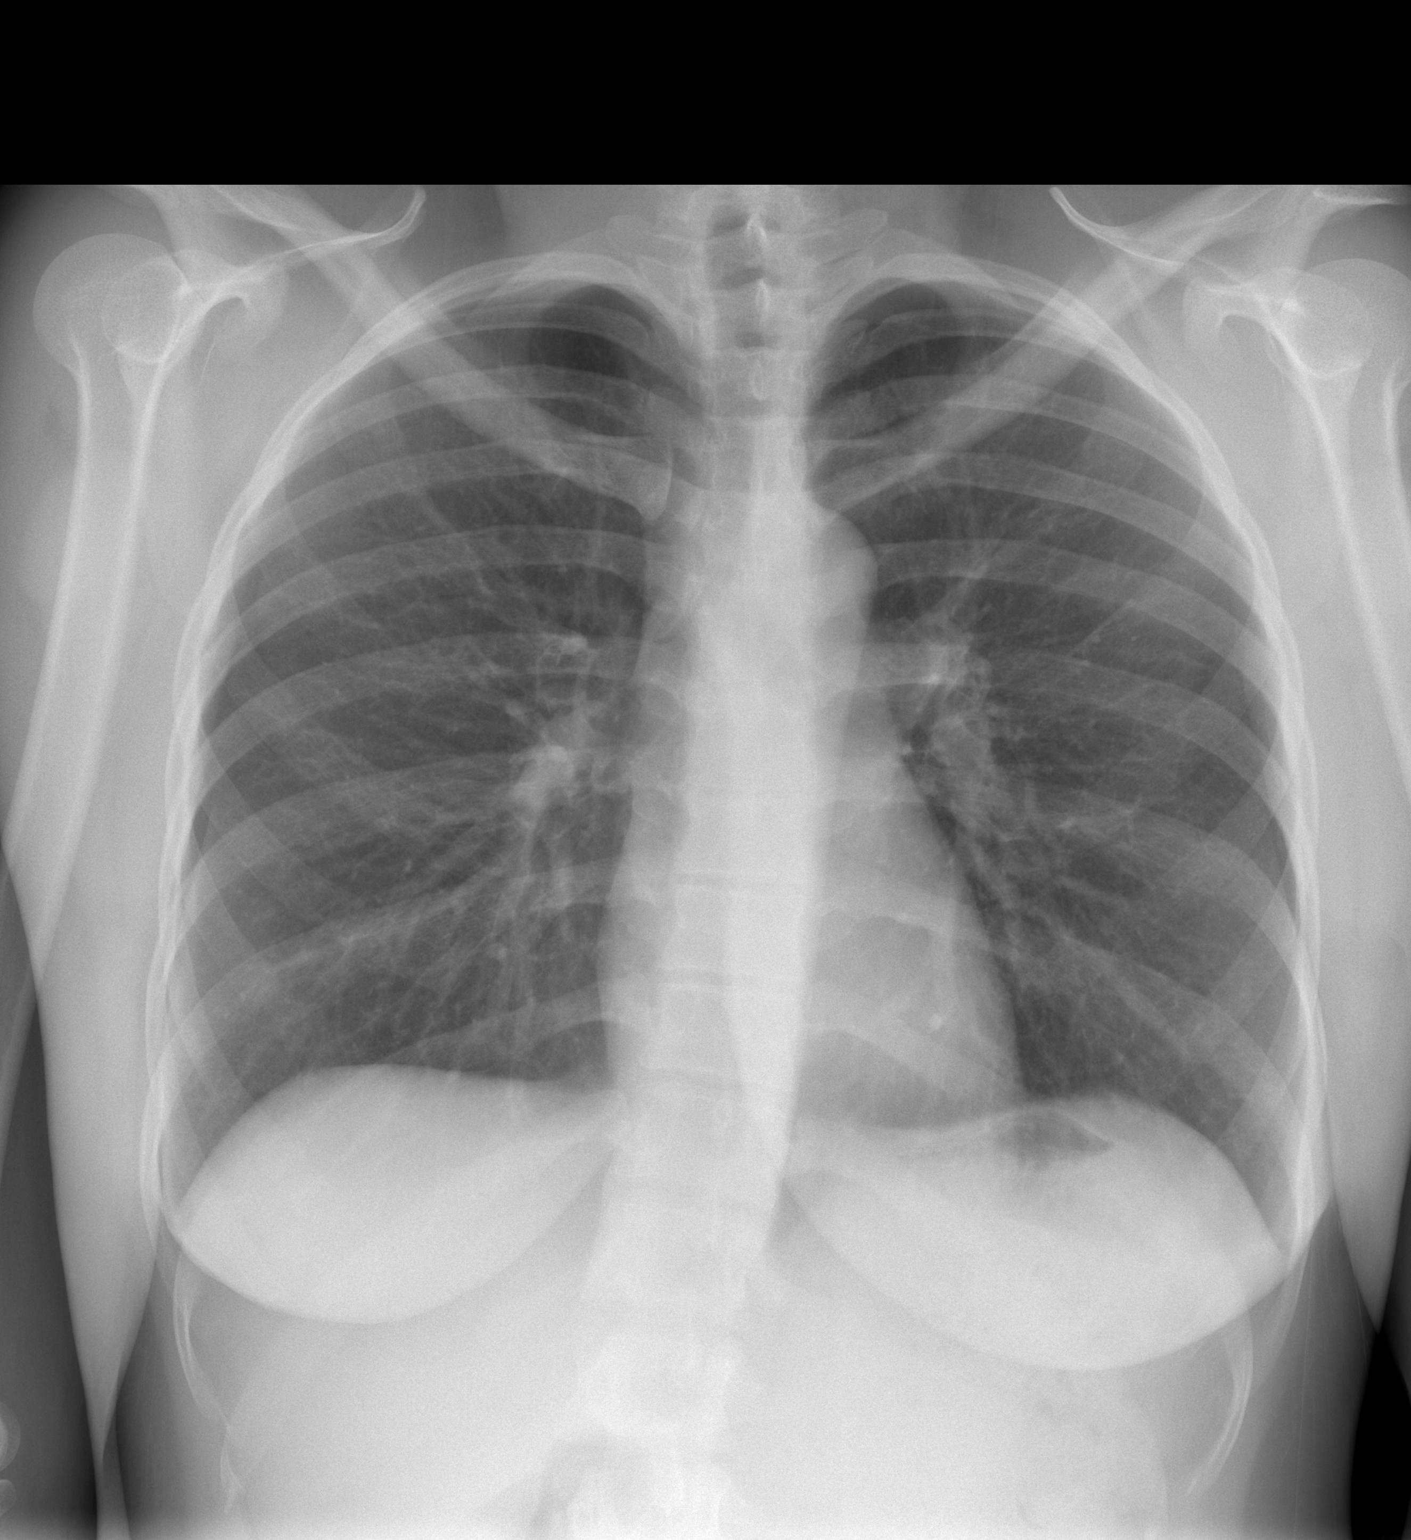

[w chest lat]
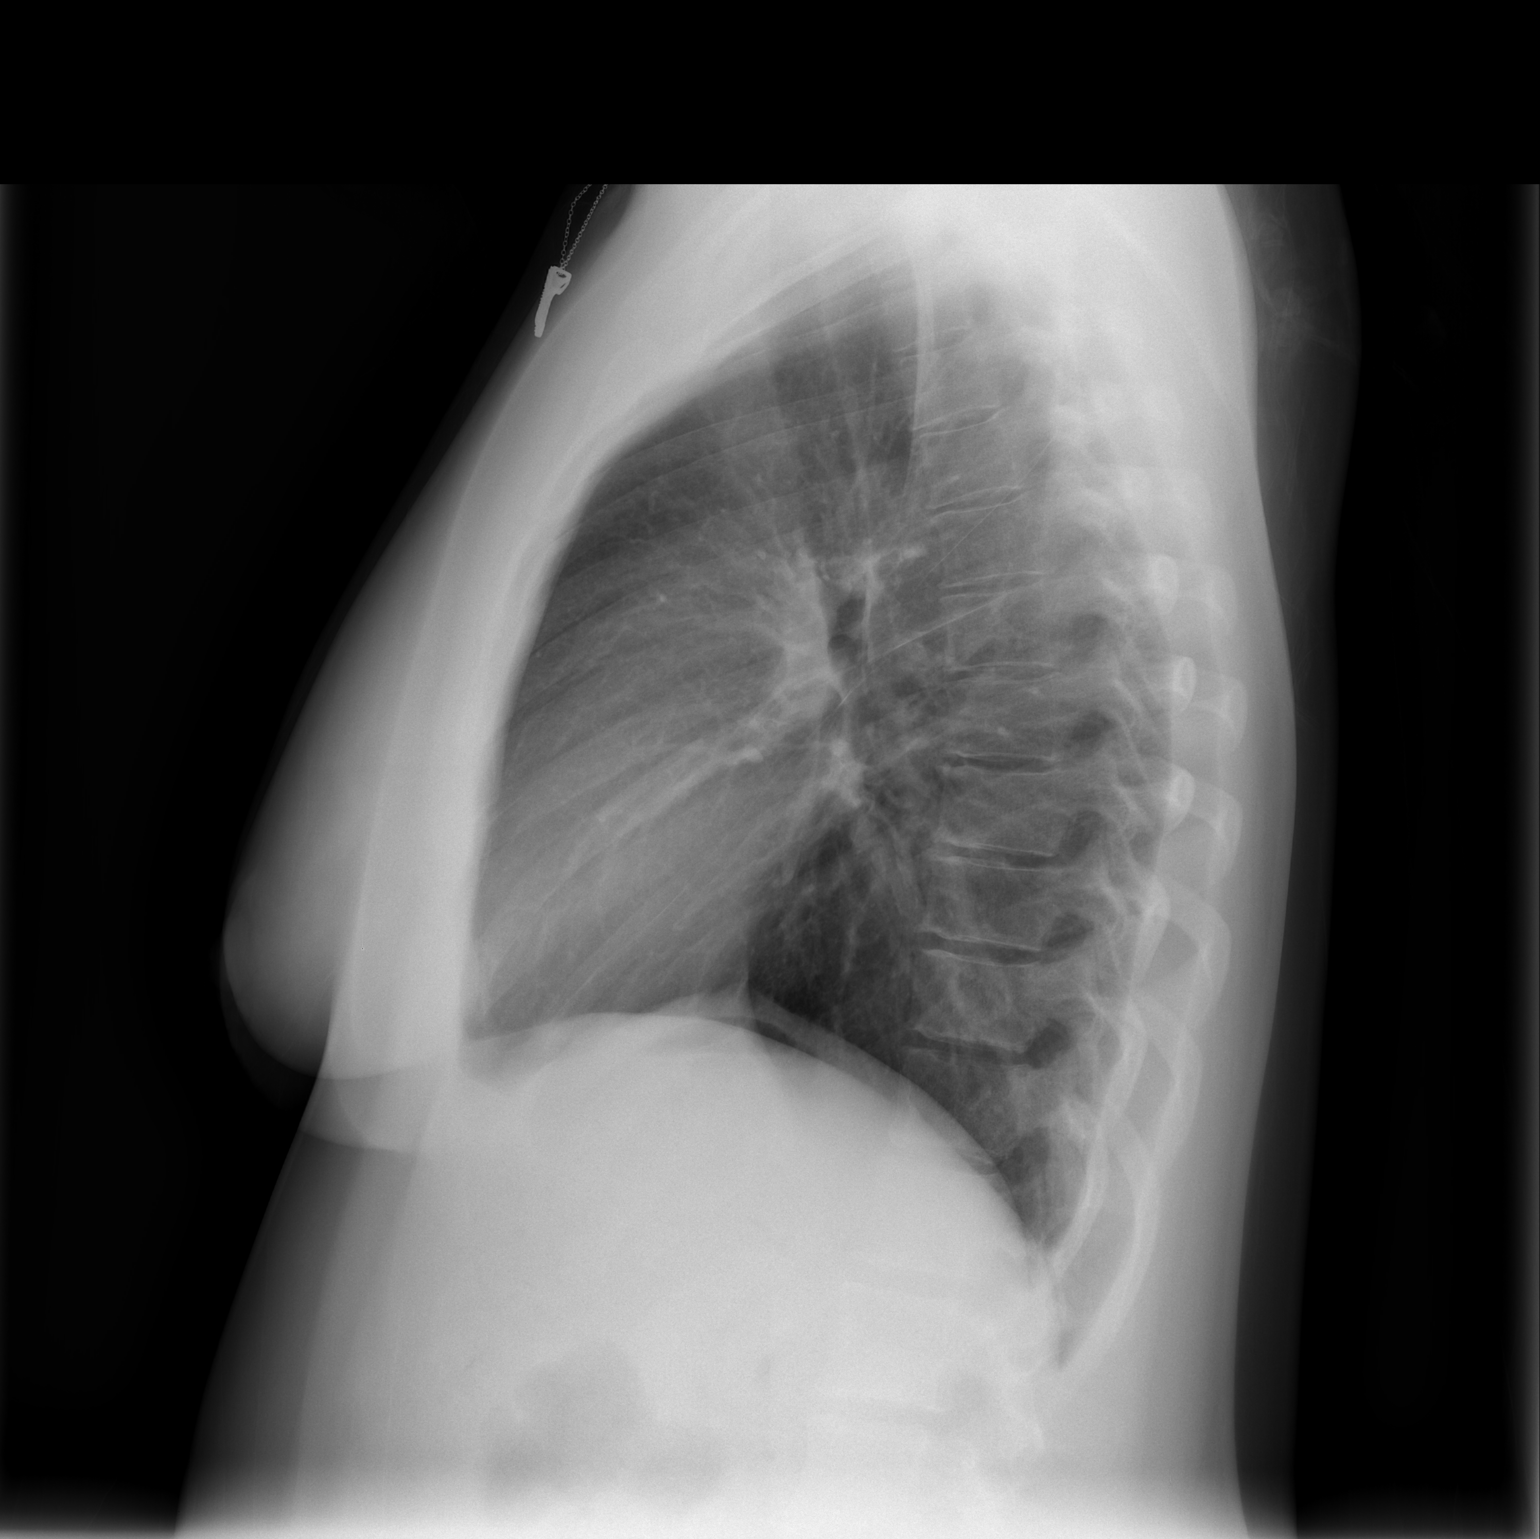

[2 of 2 positions shown; findings below may reference images not displayed]

FINDINGS: Trachea is midline.  Heart size normal.  Lungs area clear.  No pleural fluid.
IMPRESSION: No acute findings.

## 2008-02-06 ENCOUNTER — Emergency Department (HOSPITAL_COMMUNITY): Admission: EM | Admit: 2008-02-06 | Discharge: 2008-02-06 | Payer: Self-pay | Admitting: Emergency Medicine

## 2008-02-20 ENCOUNTER — Emergency Department (HOSPITAL_COMMUNITY): Admission: EM | Admit: 2008-02-20 | Discharge: 2008-02-20 | Payer: Self-pay | Admitting: Emergency Medicine

## 2008-02-28 IMAGING — CR DG SHOULDER 2+V*R*
3 series · 3 of 3 positions shown · non-contrast
Comparison: none

CLINICAL DATA: Seizure. Right shoulder pain.
 RIGHT SHOULDER ? 3 VIEWS:

[view not recorded (1 of 3)]
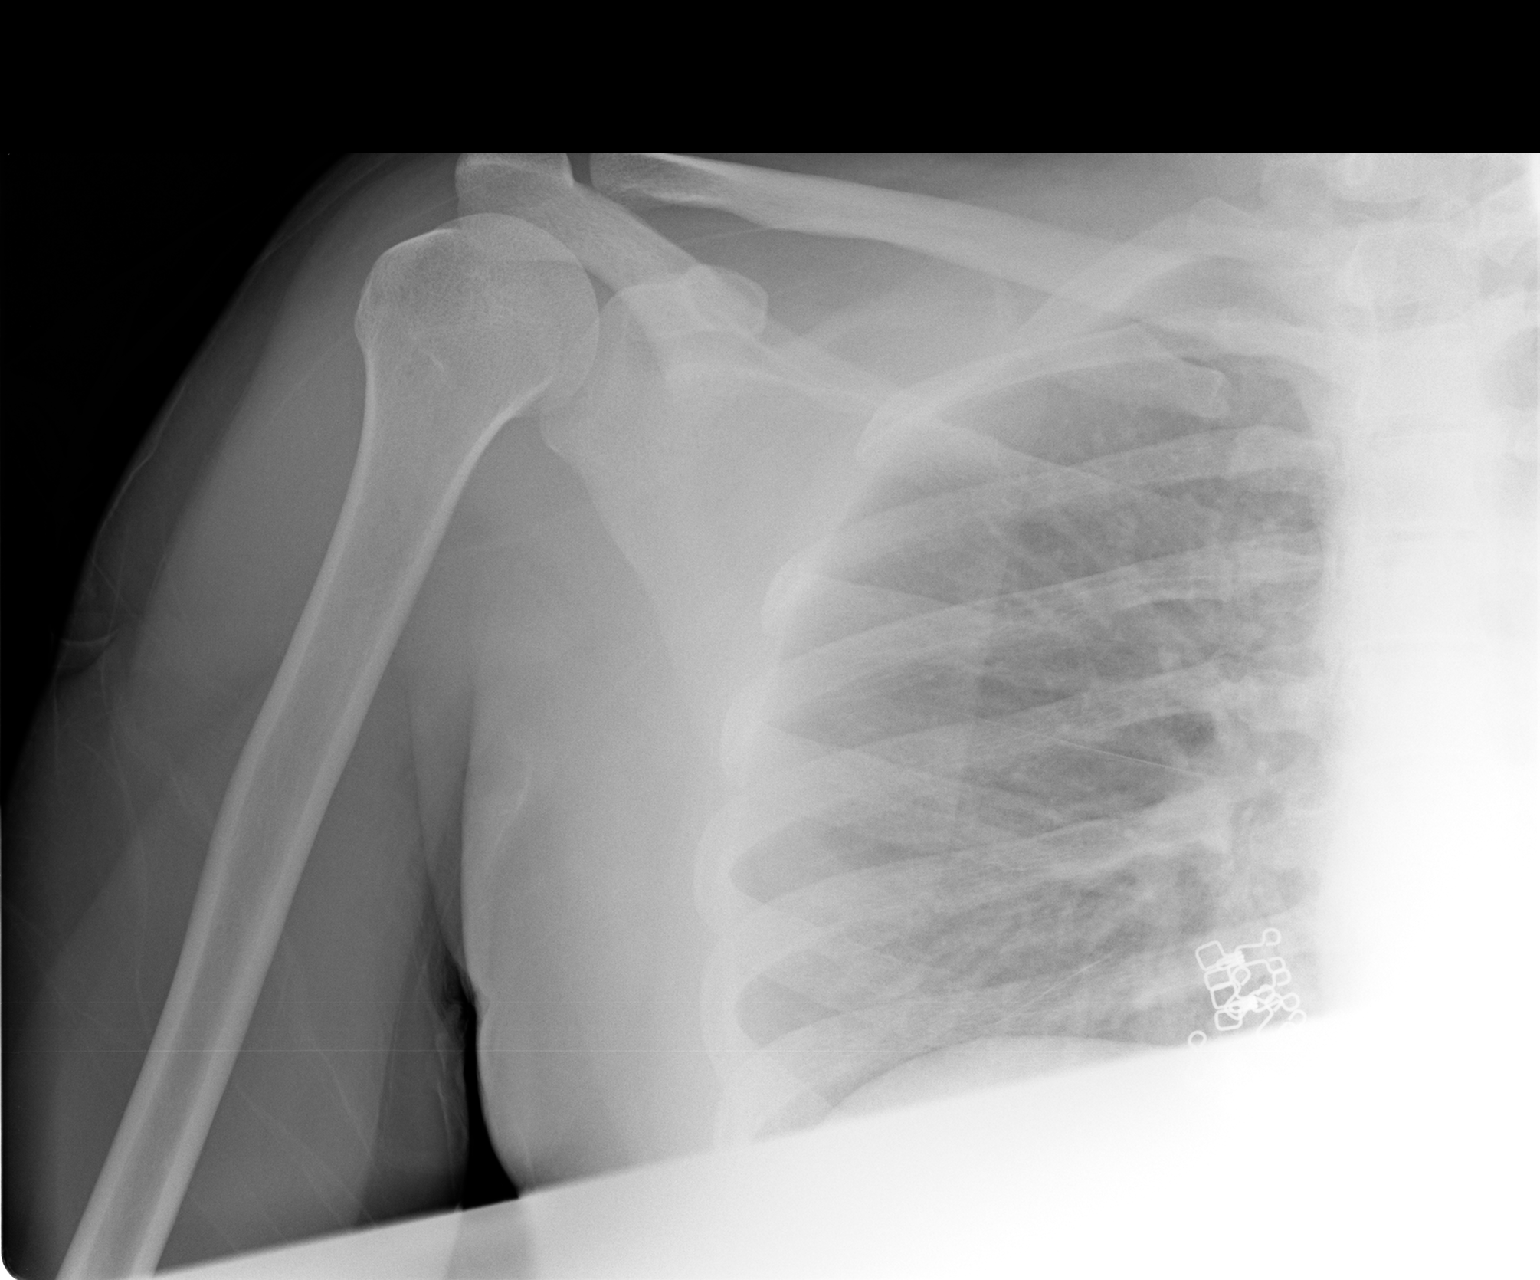

[view not recorded (2 of 3)]
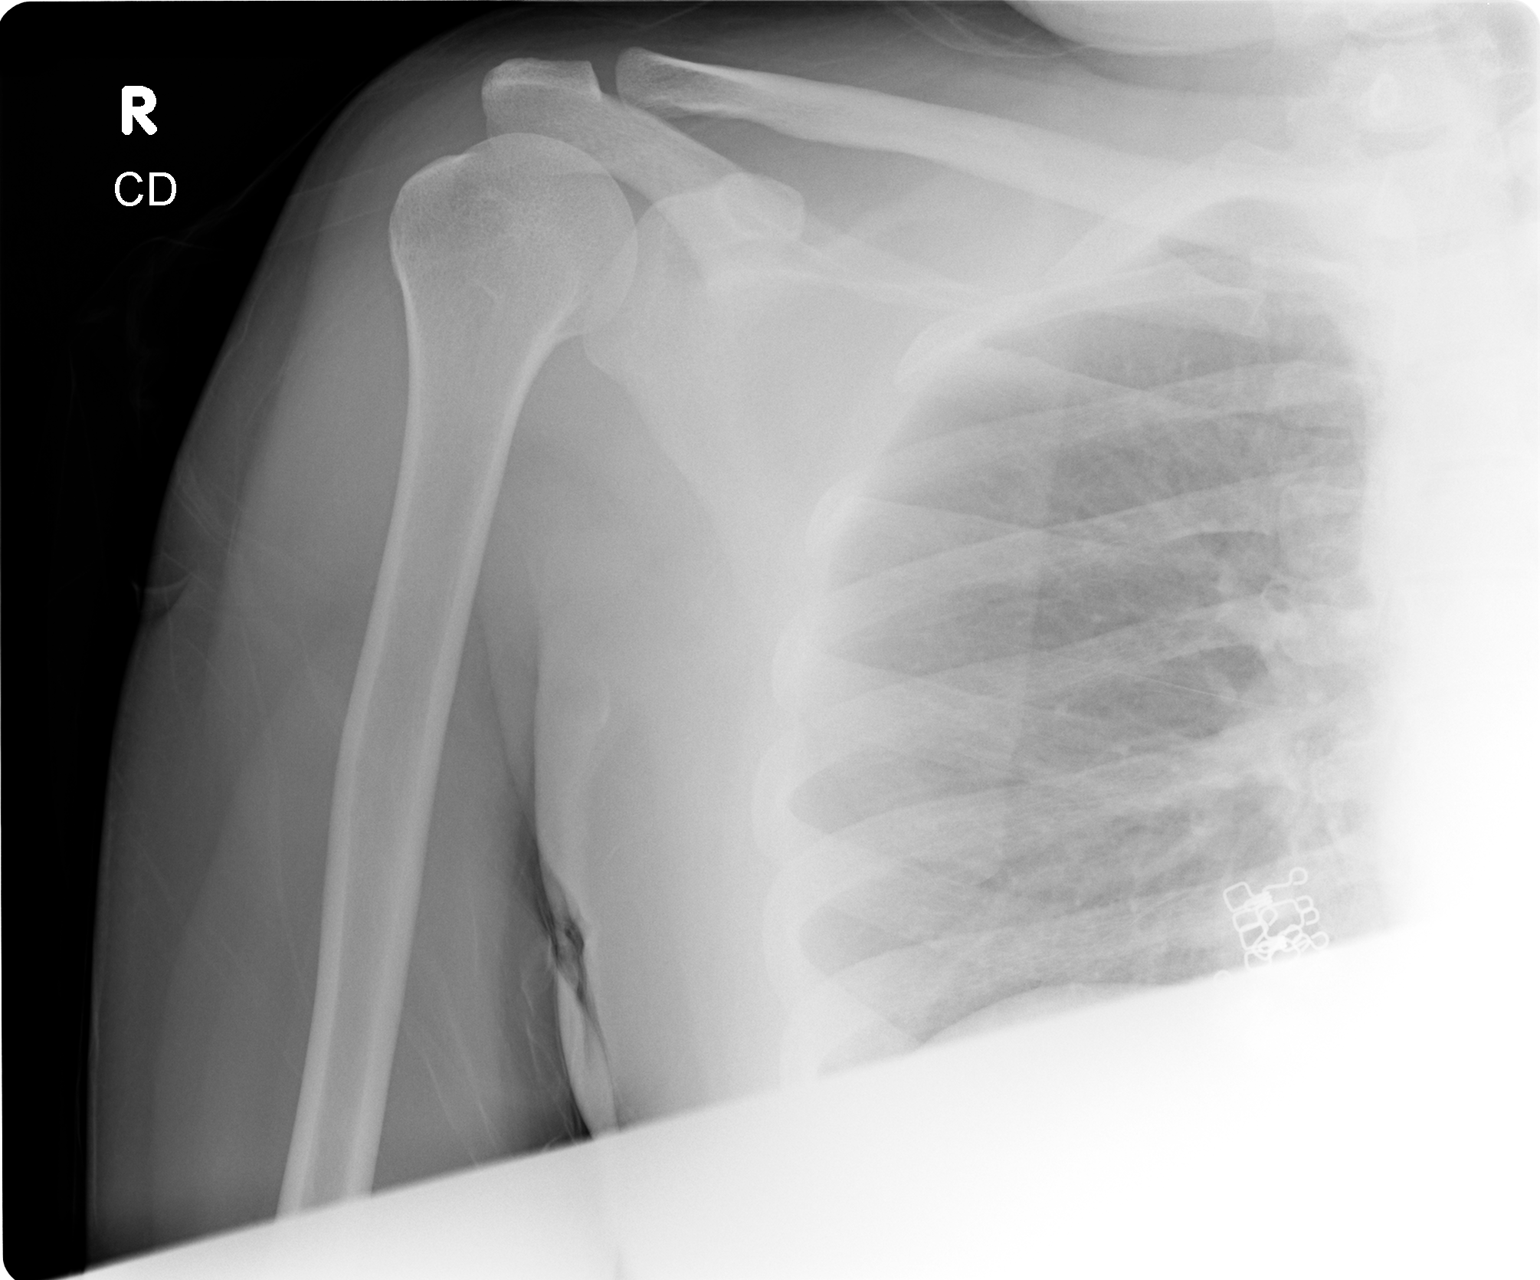

[view not recorded (3 of 3)]
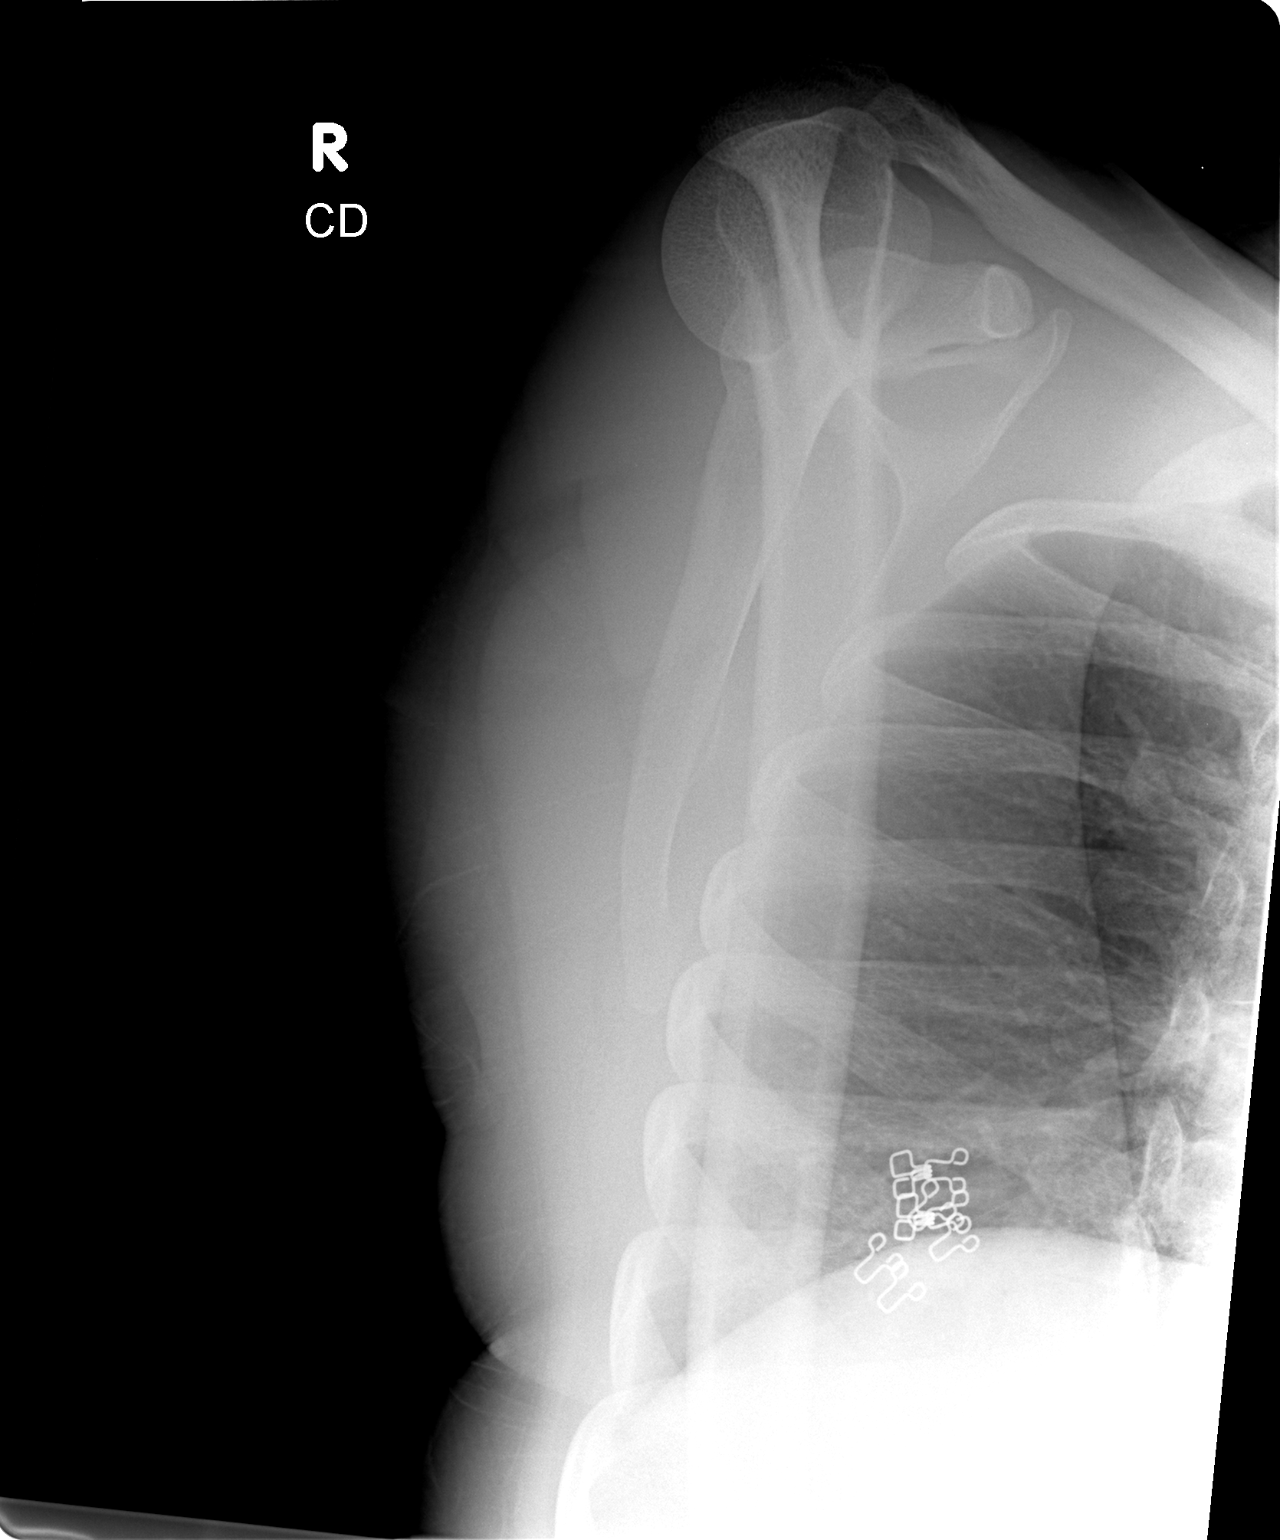

[3 of 3 positions shown; findings below may reference images not displayed]

FINDINGS: Three views of the right shoulder were obtained.  No acute fracture is seen. The glenohumeral joint space appears normal and the acromioclavicular joint is normally aligned.
IMPRESSION: Negative.

## 2008-03-04 ENCOUNTER — Emergency Department (HOSPITAL_COMMUNITY): Admission: EM | Admit: 2008-03-04 | Discharge: 2008-03-04 | Payer: Self-pay | Admitting: Emergency Medicine

## 2008-03-26 IMAGING — CR DG CHEST 2V
2 series · 2 of 2 positions shown · non-contrast
Comparison: 06/06/2007

CLINICAL DATA: Cough, chest pain

CHEST - 2 VIEW:

[w chest pa]
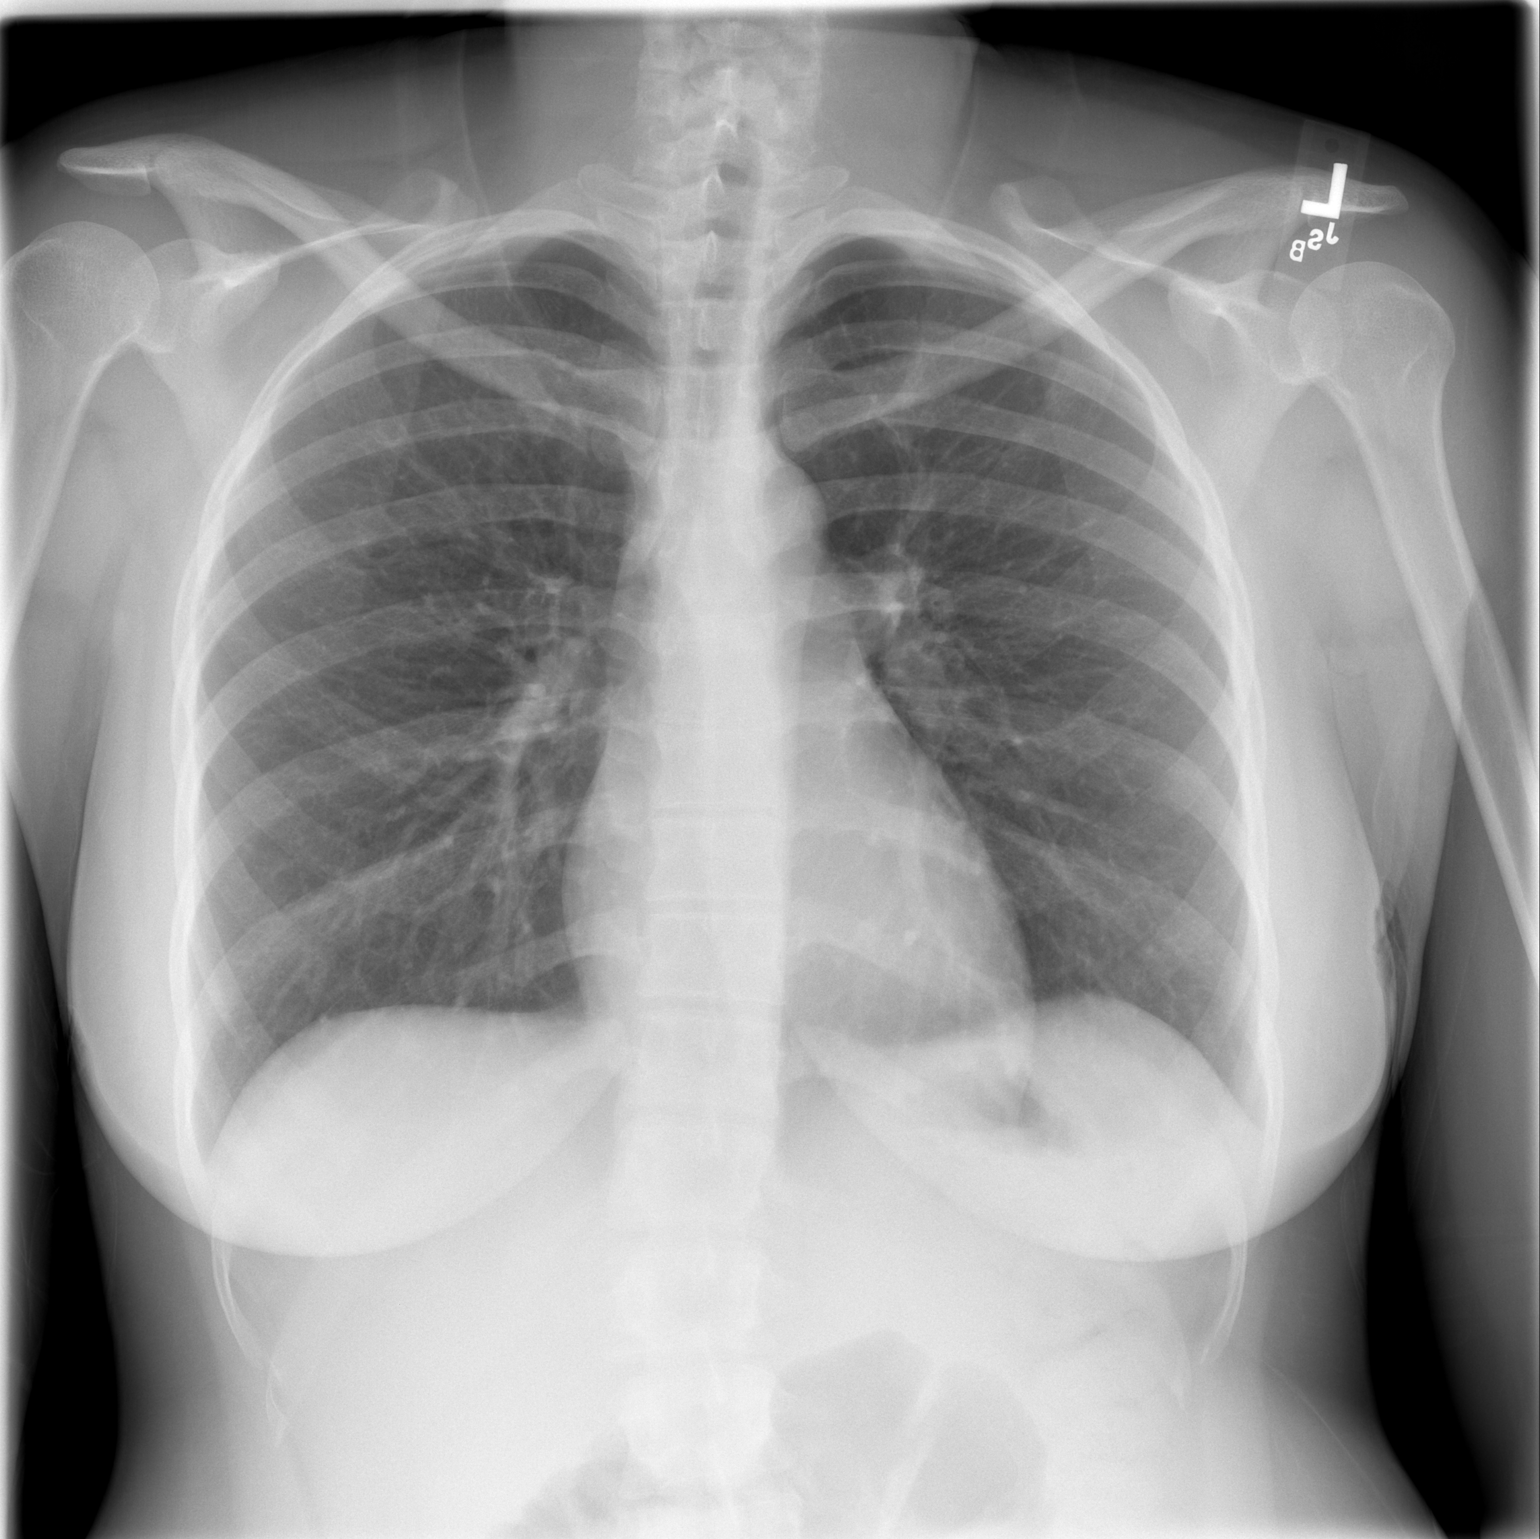

[w chest lat]
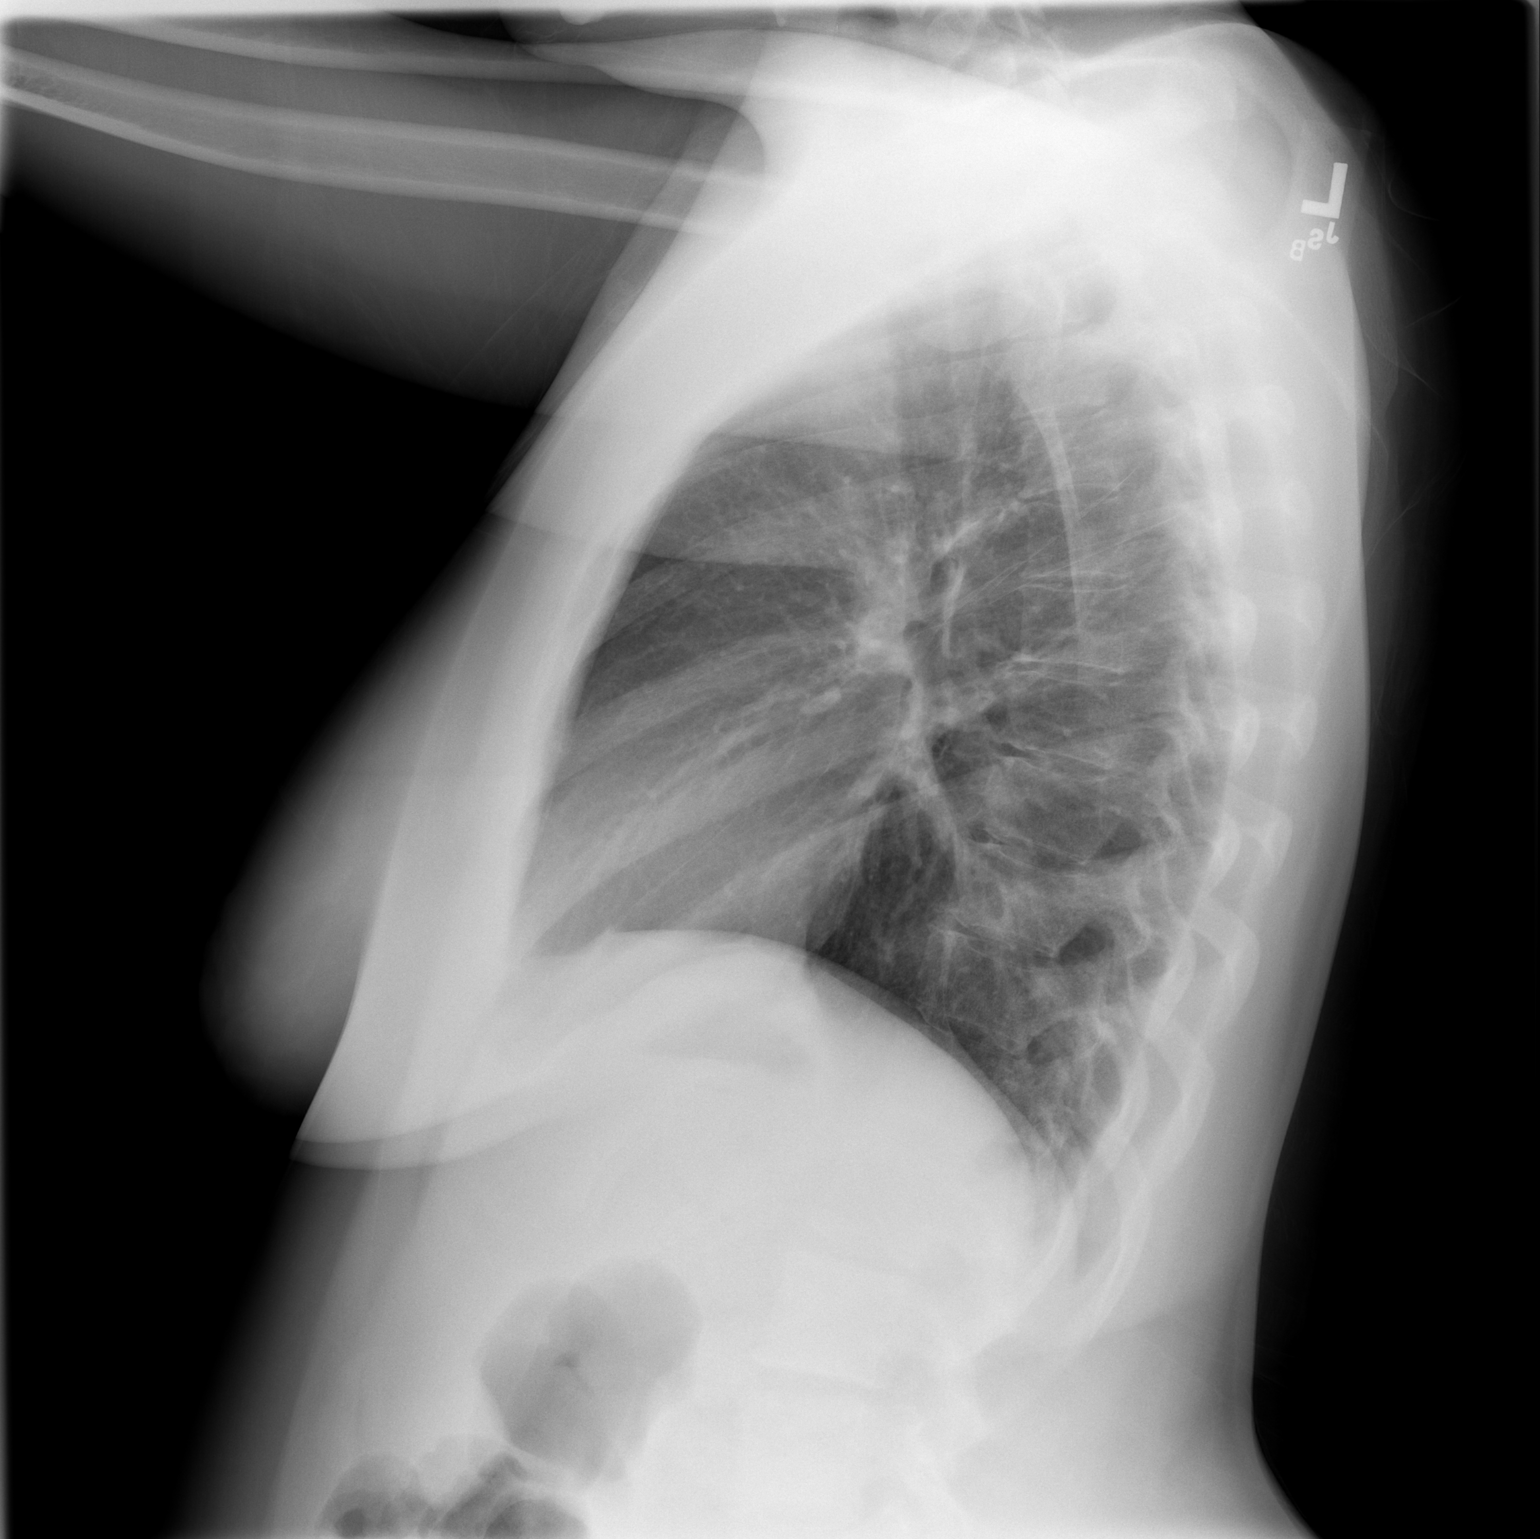

[2 of 2 positions shown; findings below may reference images not displayed]

FINDINGS: The heart size and mediastinal contours are within normal limits. 
Both lungs are clear.  The visualized skeletal structures are unremarkable.
IMPRESSION: No active cardiopulmonary disease

## 2008-03-30 ENCOUNTER — Emergency Department (HOSPITAL_COMMUNITY): Admission: EM | Admit: 2008-03-30 | Discharge: 2008-03-31 | Payer: Self-pay | Admitting: Emergency Medicine

## 2008-04-01 ENCOUNTER — Emergency Department (HOSPITAL_COMMUNITY): Admission: EM | Admit: 2008-04-01 | Discharge: 2008-04-01 | Payer: Self-pay | Admitting: Emergency Medicine

## 2008-04-05 ENCOUNTER — Emergency Department (HOSPITAL_COMMUNITY): Admission: EM | Admit: 2008-04-05 | Discharge: 2008-04-05 | Payer: Self-pay | Admitting: Emergency Medicine

## 2008-04-07 IMAGING — CR DG CHEST 2V
2 series · 2 of 2 positions shown · non-contrast
Comparison: 07/29/2007

CLINICAL DATA: Chest pain. 
 CHEST - 2 VIEW:

[w chest pa]
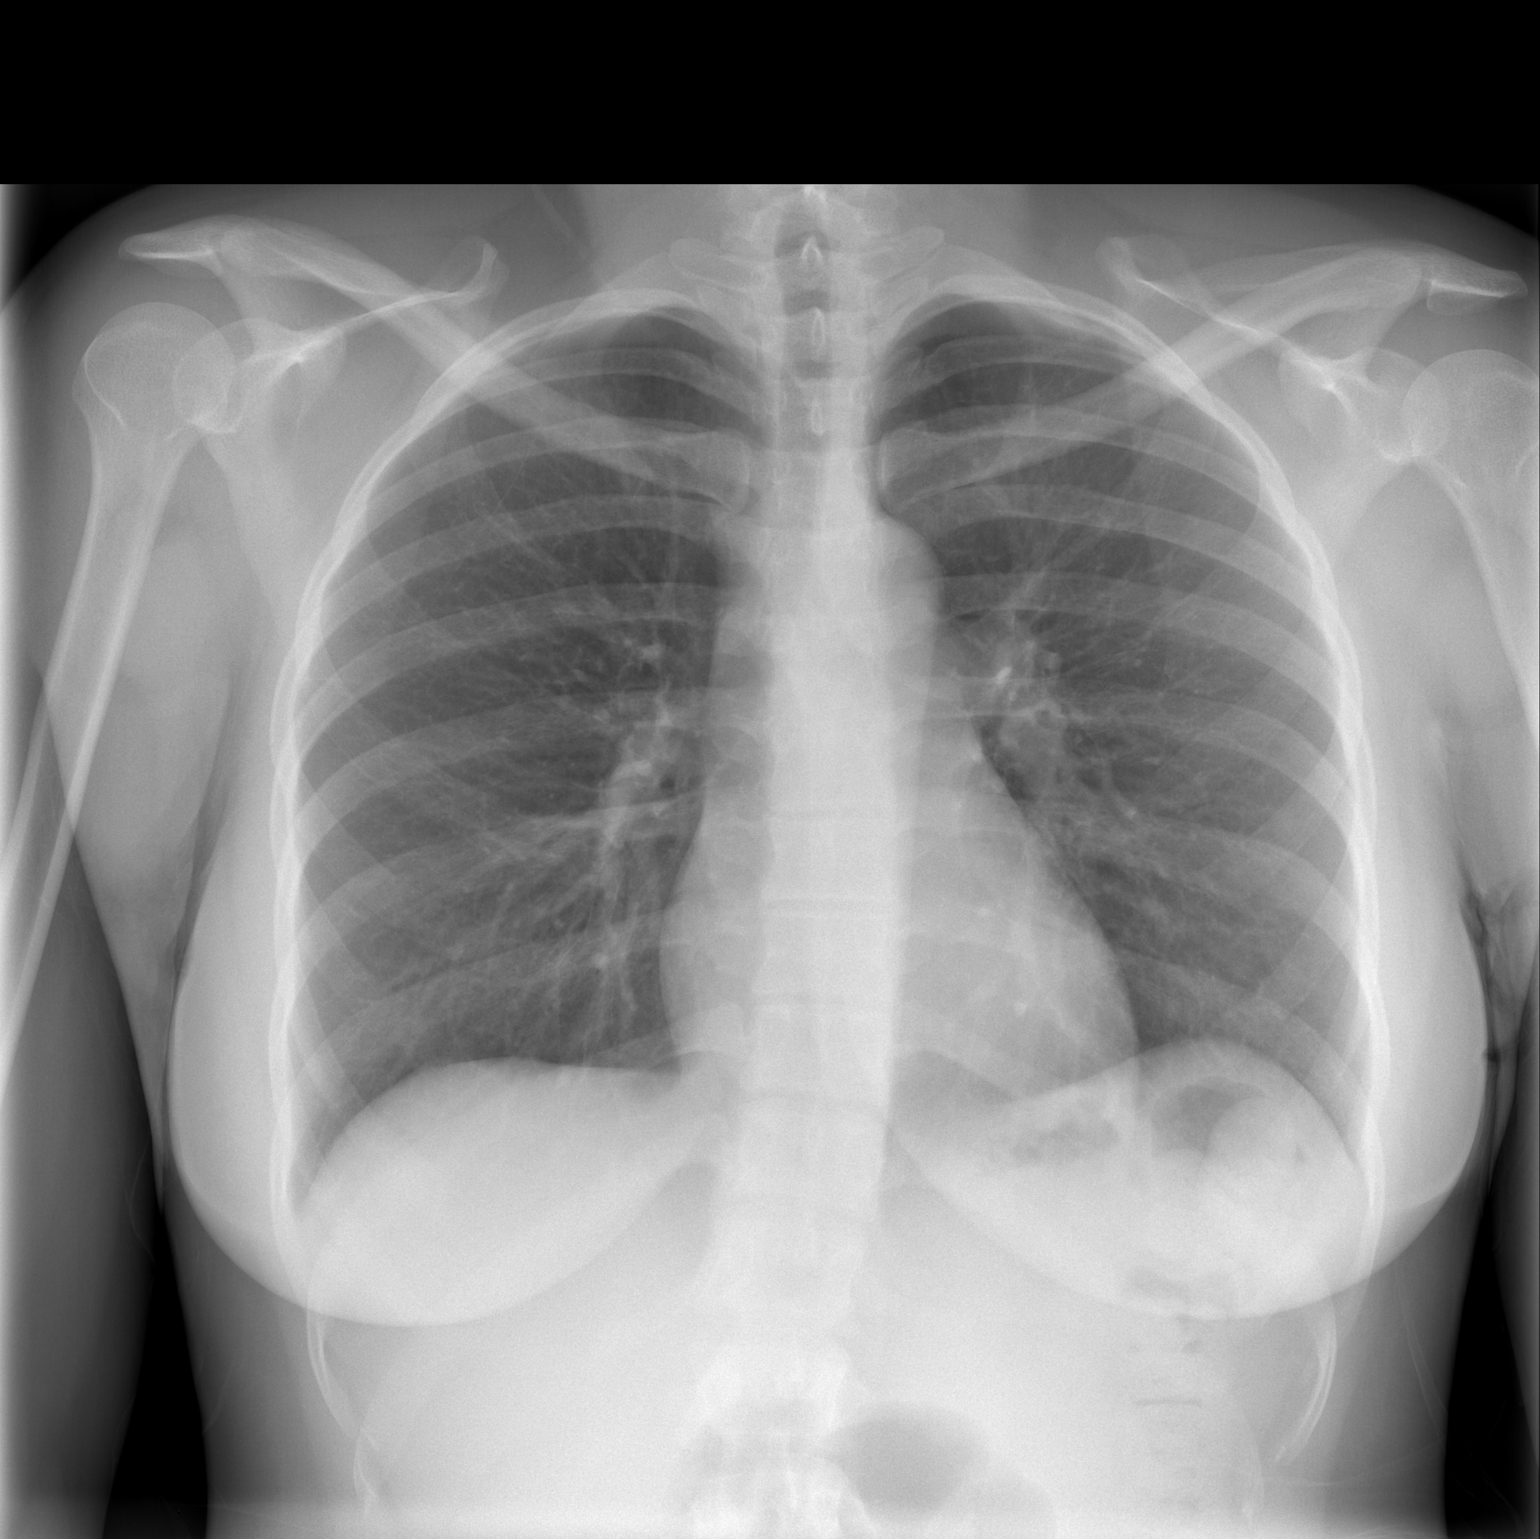

[w chest lat]
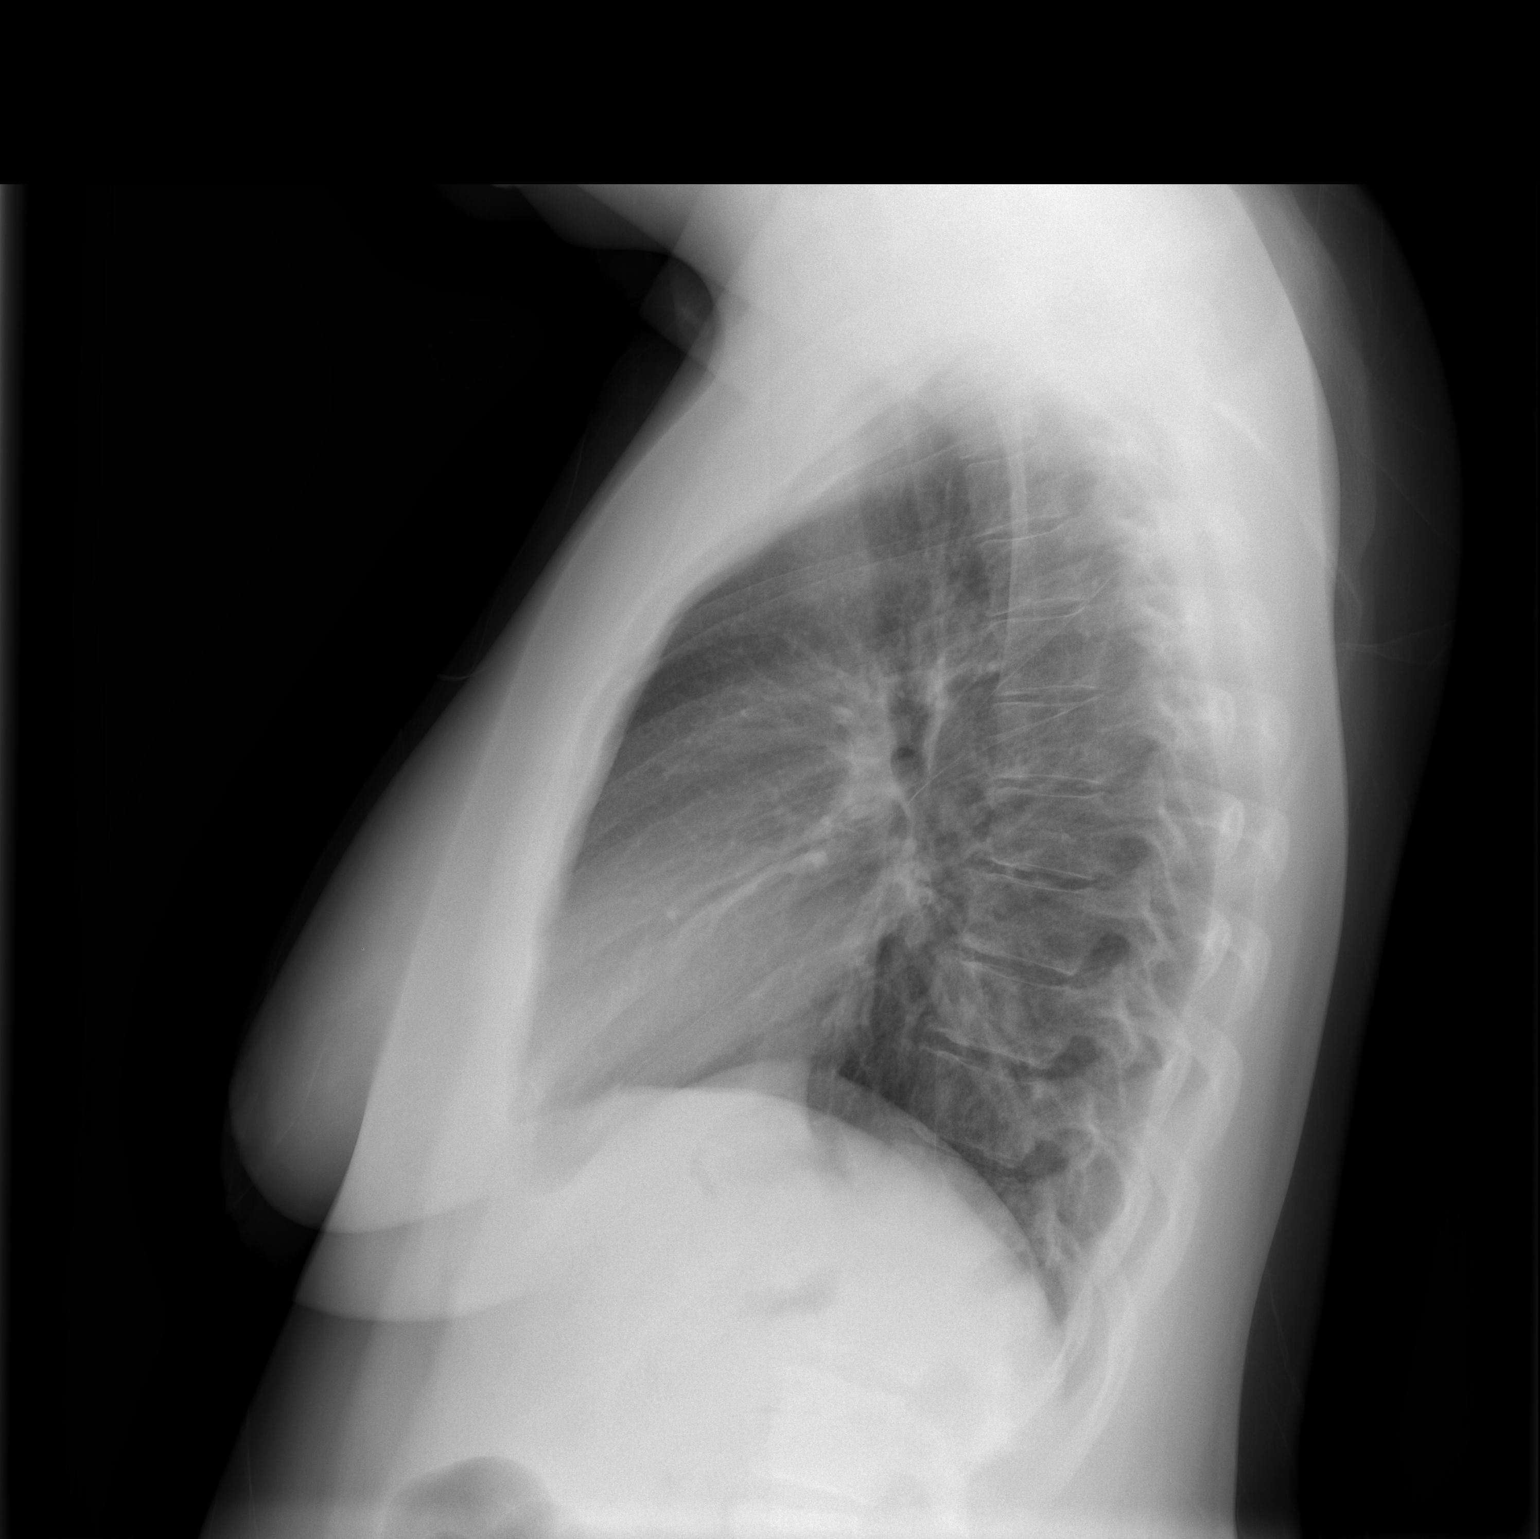

[2 of 2 positions shown; findings below may reference images not displayed]

FINDINGS: The heart size and mediastinal contours are within normal limits.  Both lungs are clear.  The visualized skeletal structures are unremarkable.
IMPRESSION: No active cardiopulmonary disease.

## 2008-04-25 IMAGING — CR DG CHEST 2V
2 series · 2 of 2 positions shown · non-contrast
Comparison: 08/10/07.

CLINICAL DATA: Cough and fever. Dyspnea.
 CHEST - 2 VIEW:

[w chest pa]
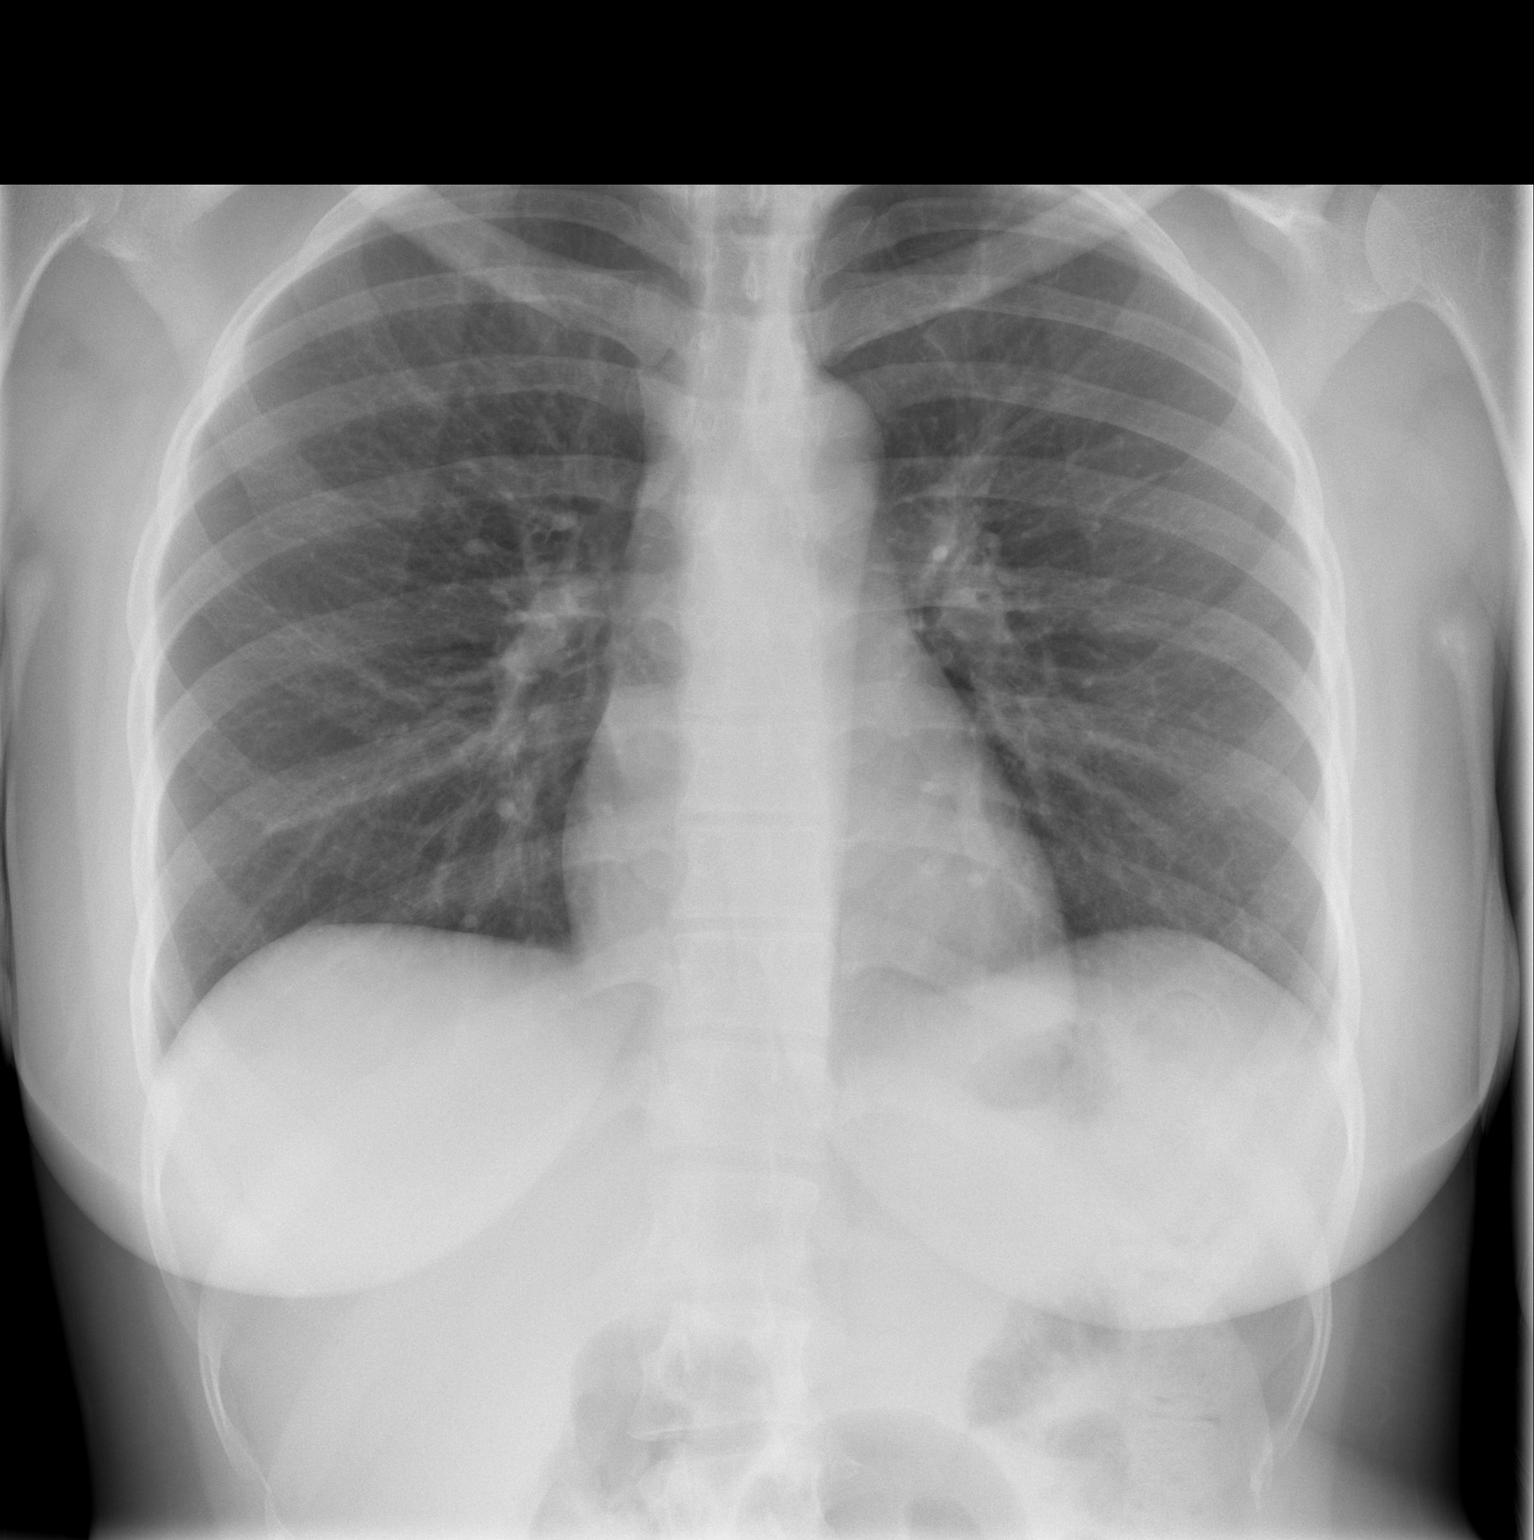

[w chest lat]
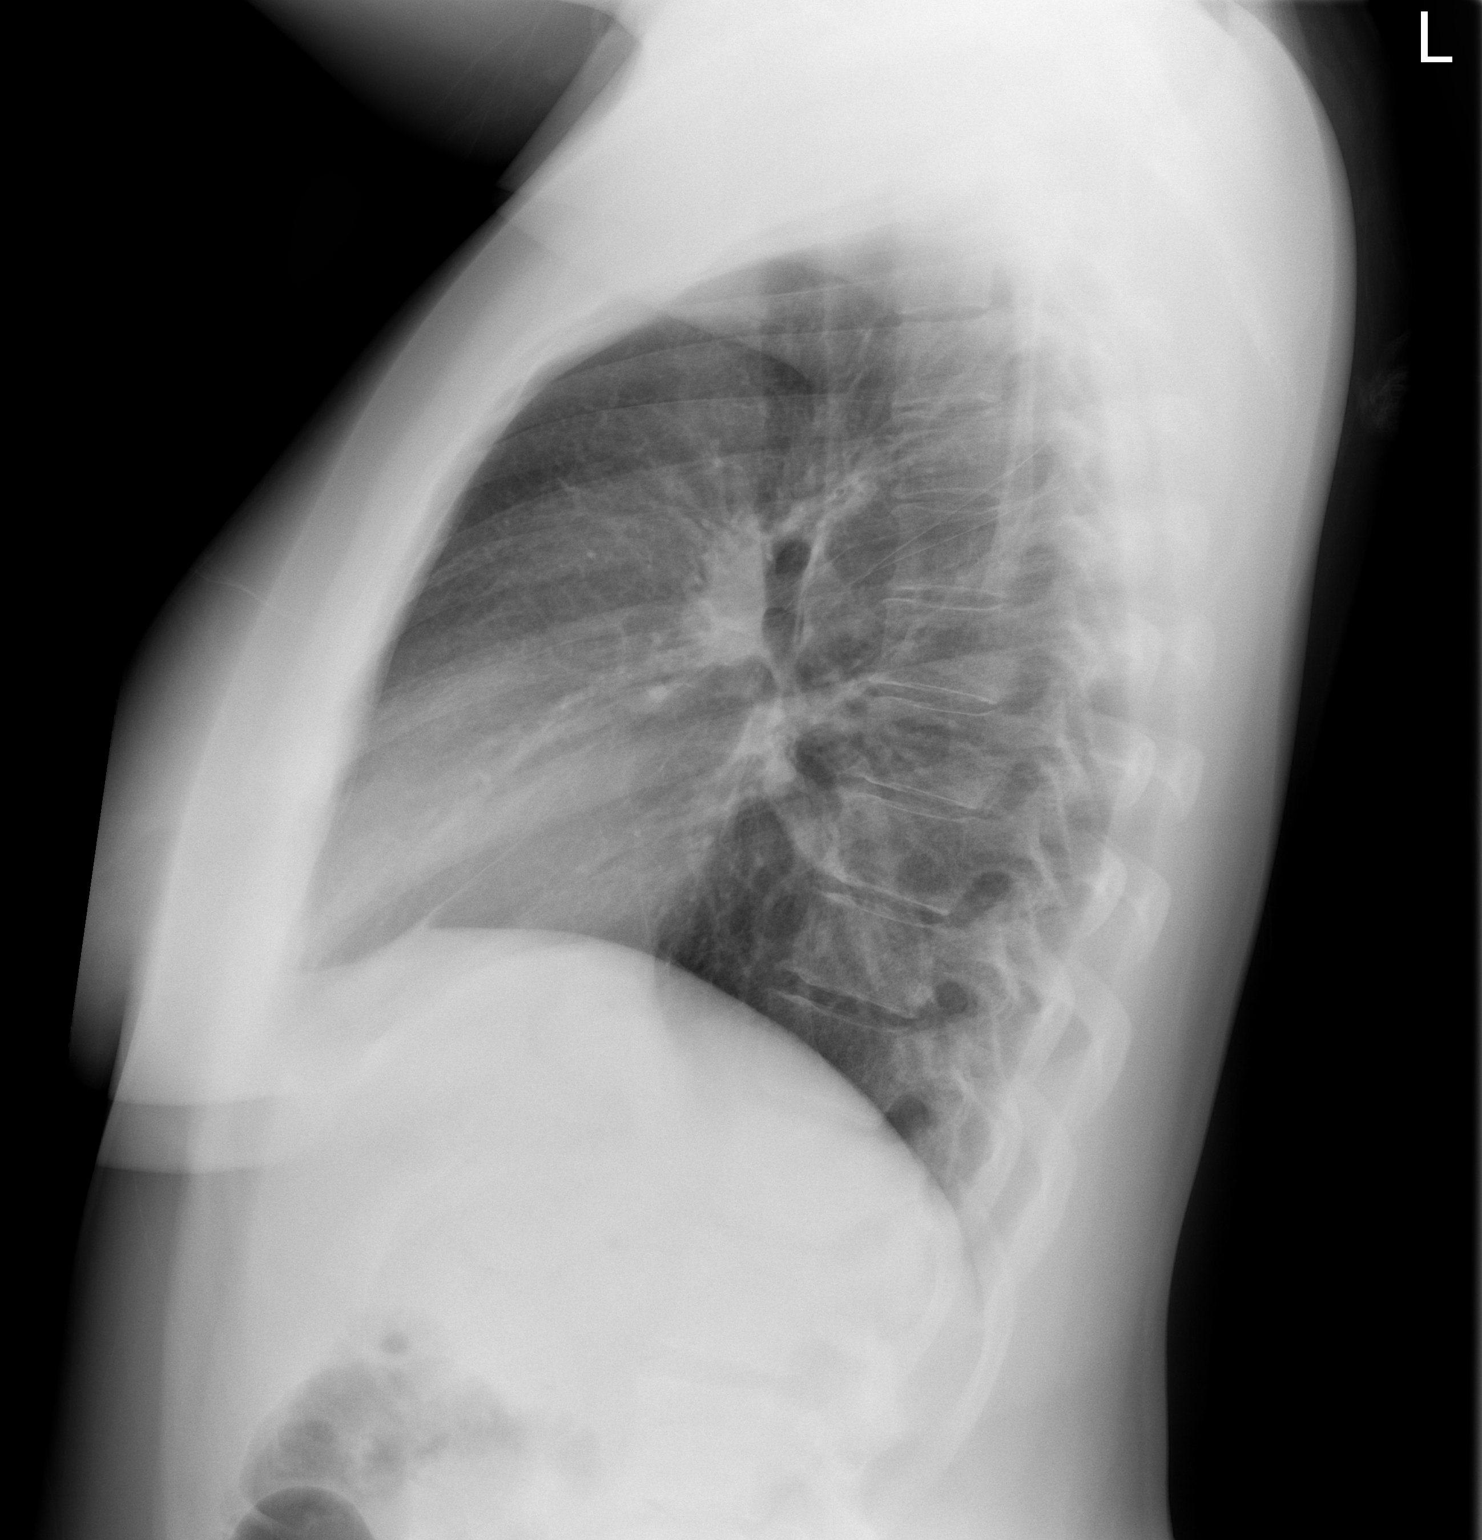

[2 of 2 positions shown; findings below may reference images not displayed]

FINDINGS: The heart size and mediastinal contours are within normal limits.  Both lungs are clear.  The visualized skeletal structures are unremarkable.
IMPRESSION: No active cardiopulmonary disease.

## 2008-05-05 ENCOUNTER — Emergency Department (HOSPITAL_COMMUNITY): Admission: EM | Admit: 2008-05-05 | Discharge: 2008-05-05 | Payer: Self-pay | Admitting: Emergency Medicine

## 2008-05-08 ENCOUNTER — Emergency Department (HOSPITAL_COMMUNITY): Admission: EM | Admit: 2008-05-08 | Discharge: 2008-05-08 | Payer: Self-pay | Admitting: Emergency Medicine

## 2008-05-24 IMAGING — CT CT CERVICAL SPINE W/O CM
3 of 4 series · 11 of 20 positions shown, 12 images · non-contrast
Comparison: none

CLINICAL DATA: Seizure, headache, neck pain

[Series 5: c_spine 2.0 b31s · axial · 0.22mm/px · z∈[-215,-131]mm · 4 of 71 slices shown, 5 images]
[im 15/71  soft-tissue]
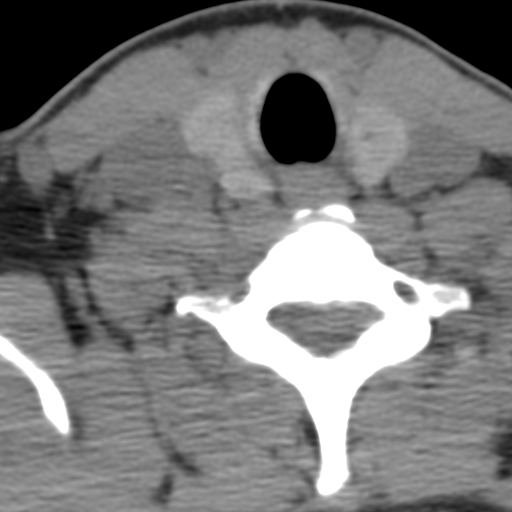
[im 15/71  bone]
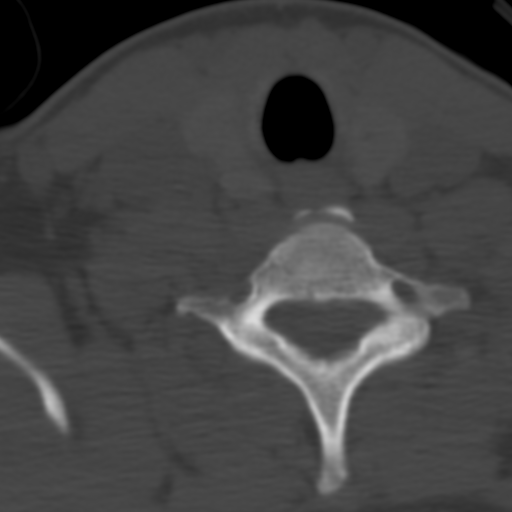
[im 29/71  bone]
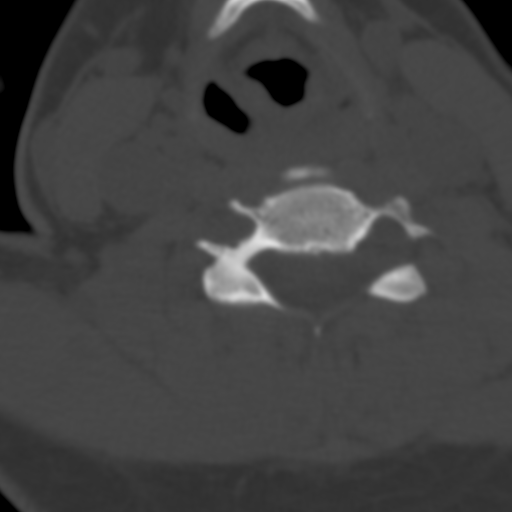
[im 43/71  bone]
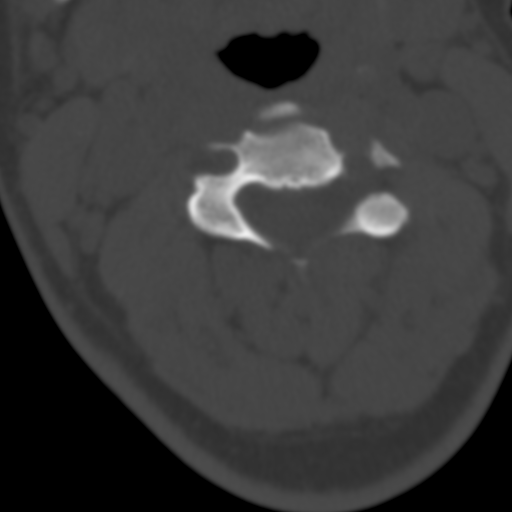
[im 57/71  bone]
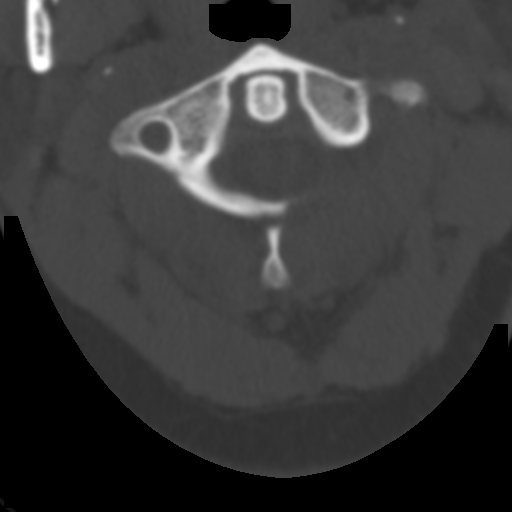

[Series 602: <mpr range> · coronal · 0.28mm/px · 3 of 35 slices shown]
[im 7/35  bone]
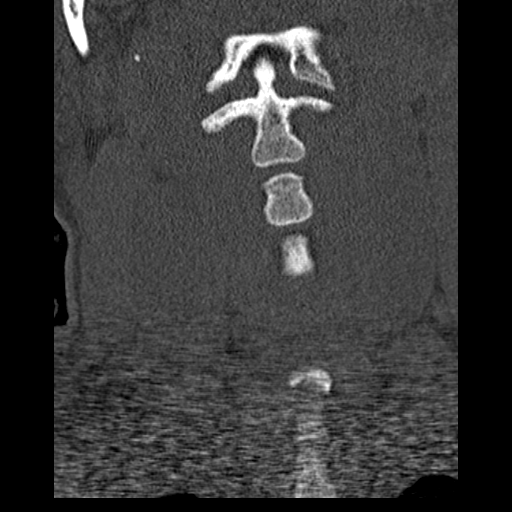
[im 14/35  bone]
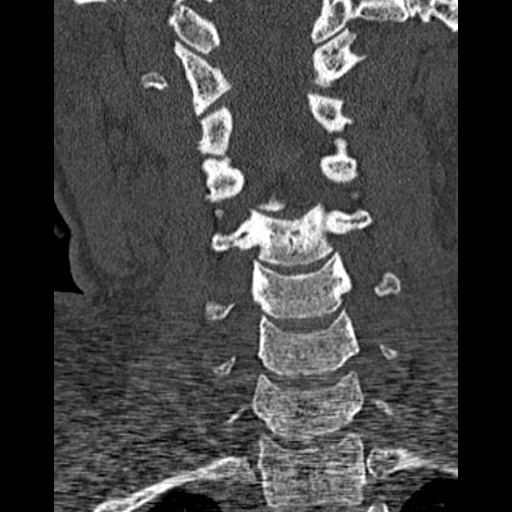
[im 21/35  bone]
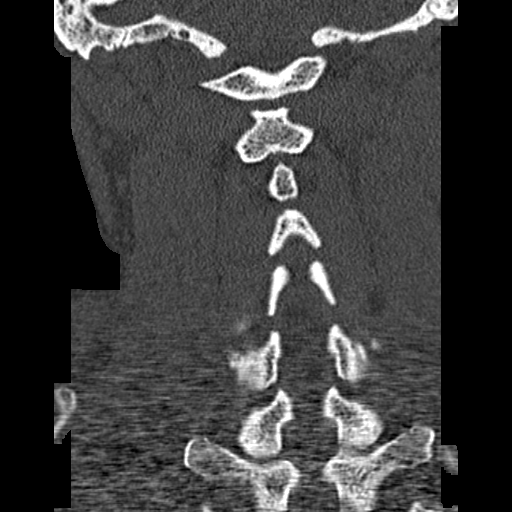

[Series 603: <mpr range(1)> · axial · 0.28mm/px · z∈[-243,-165]mm · 4 of 70 slices shown]
[im 14/70  bone]
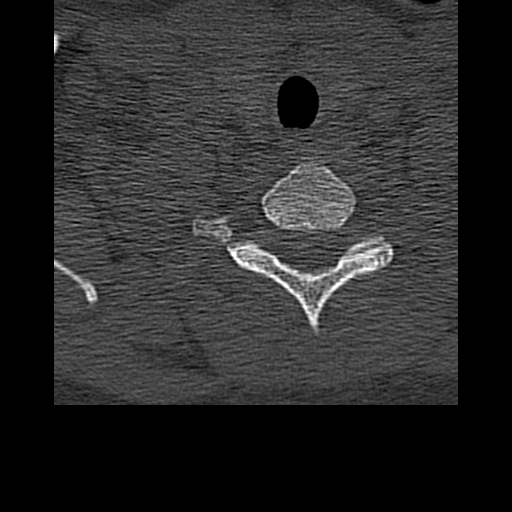
[im 28/70  bone]
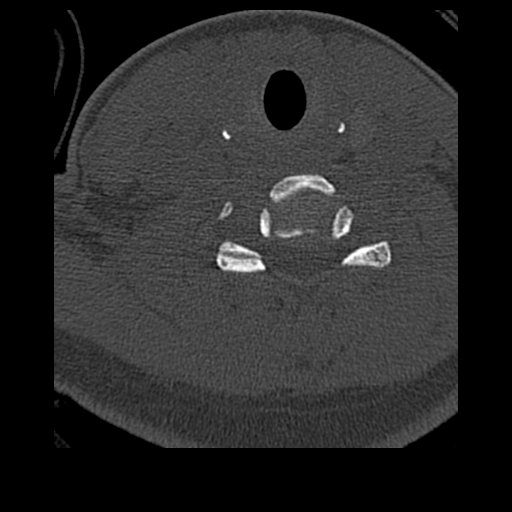
[im 42/70  bone]
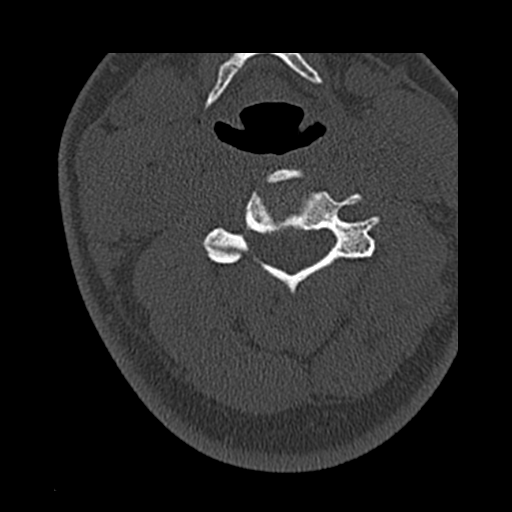
[im 56/70  bone]
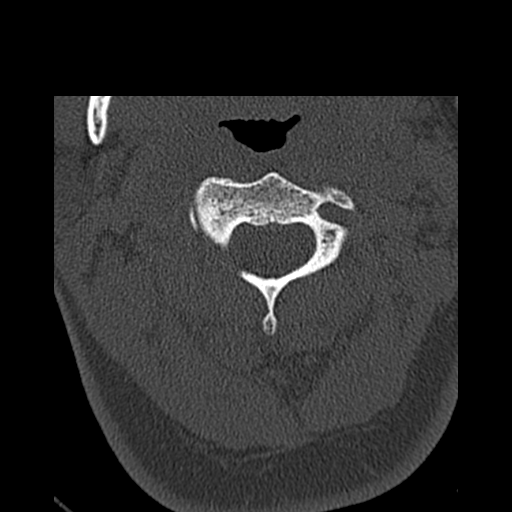

[11 of 20 positions shown; findings below may reference images not displayed]

CT head without contrast:

Comparison 08/19/2006. Bilateral ethmoid and frontal sinus disease.  There is no
evidence of acute intracranial hemorrhage, brain edema, mass,  mass effect, or
midline shift. Acute infarct may be inapparent on noncontrast CT.  No other
intra-axial abnormalities are seen, and the ventricles and sulci are within
normal limits in size and symmetry.   No abnormal extra-axial fluid collections
or masses are identified.  No significant calvarial abnormality.
IMPRESSION: 1. Negative non-contrast head CT.

CT cervical spine without contrast:

Multidetector axial helical CT with coronal and sagittal reconstructions. No
previous available for comparison. Straightening of the normal lordosis of the
cervical spine. No prevertebral soft tissue swelling. Negative for fracture. No
significant degenerative change. Regional soft tissues unremarkable. Visualized
lung apices clear. Coronal and sagittal reconstructions confirm the above
findings.
IMPRESSION: 1. Negative for fracture or other acute bone injury.
2. Straightening of normal lordosis which may be secondary to positioning,
spasm, or soft tissue injury.

## 2008-05-26 IMAGING — CR DG KNEE COMPLETE 4+V*R*
4 series · 4 of 4 positions shown · non-contrast
Comparison: Right knee x-rays 03/20/2007.

CLINICAL DATA: Seizure. Right knee pain after a fall.

RIGHT KNEE - 4 VIEW  09/28/2007:

[view not recorded (1 of 4)]
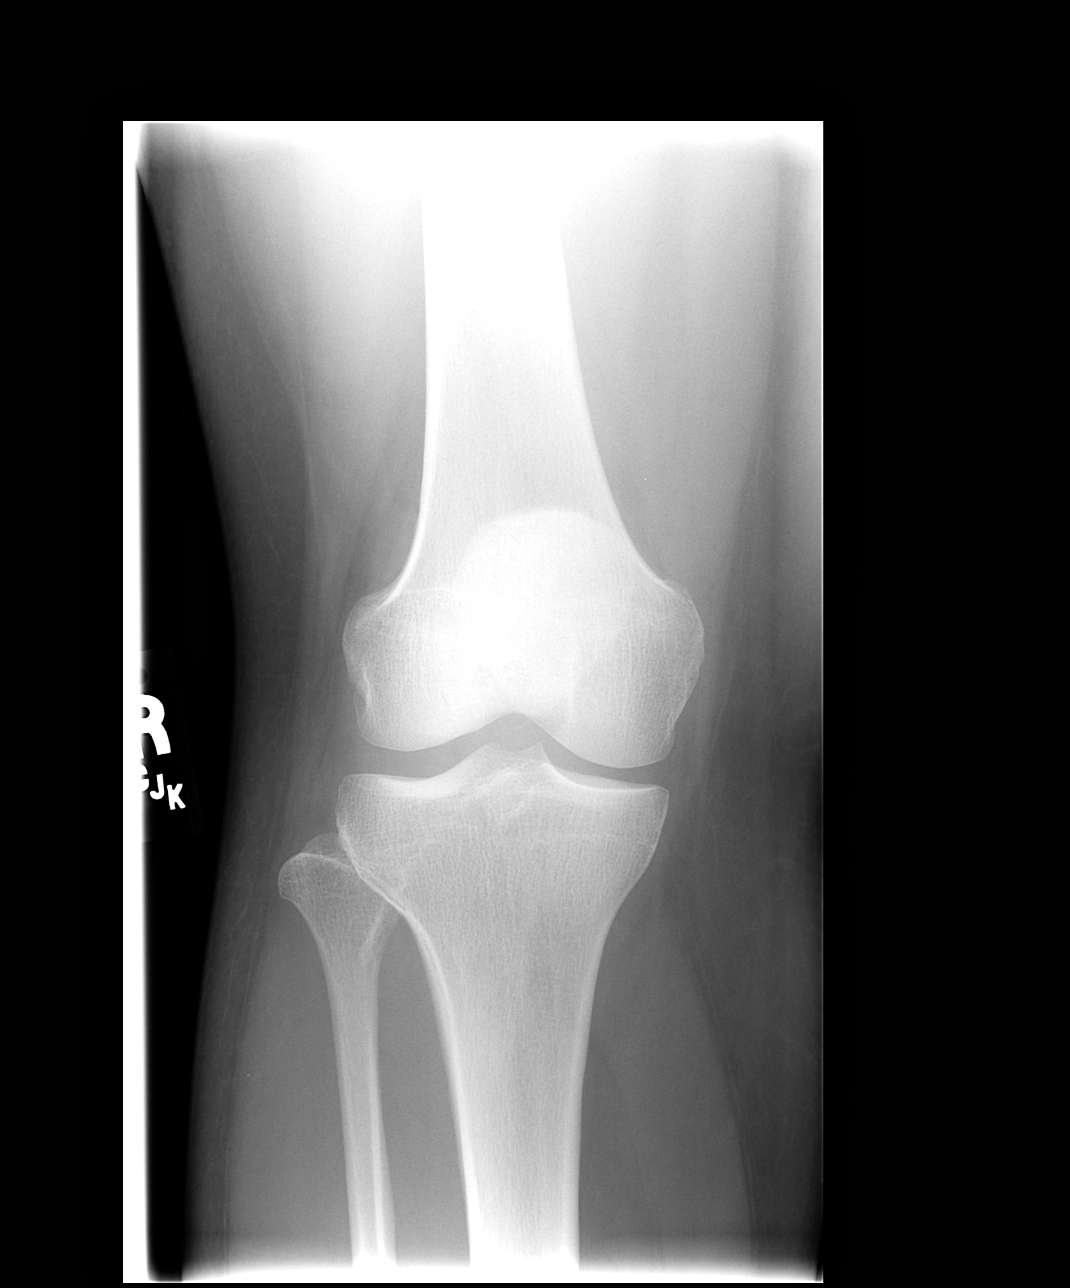

[view not recorded (2 of 4)]
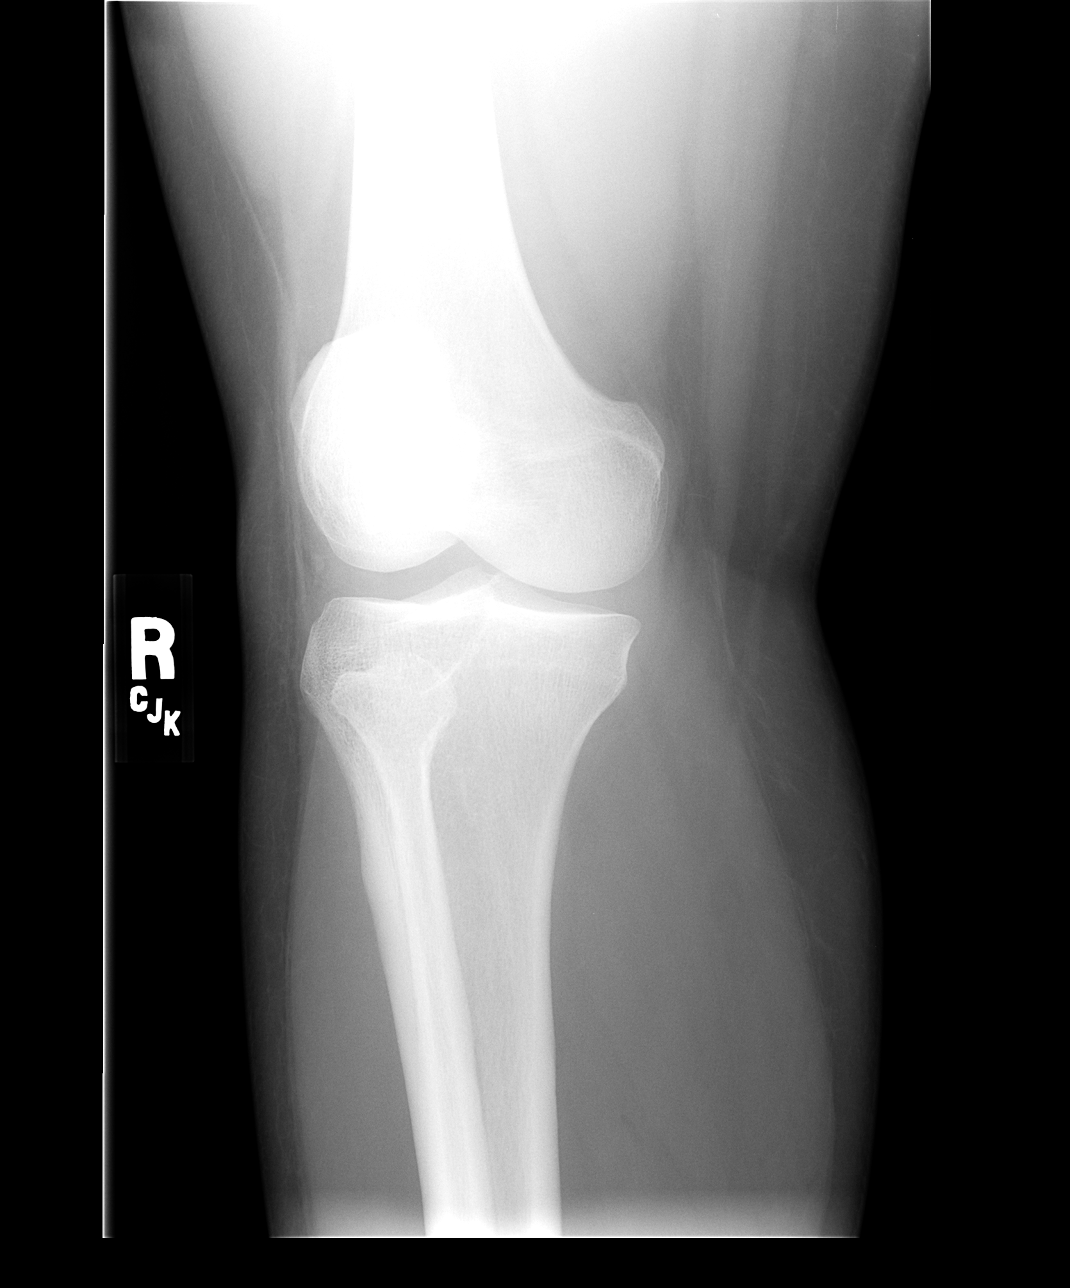

[view not recorded (3 of 4)]
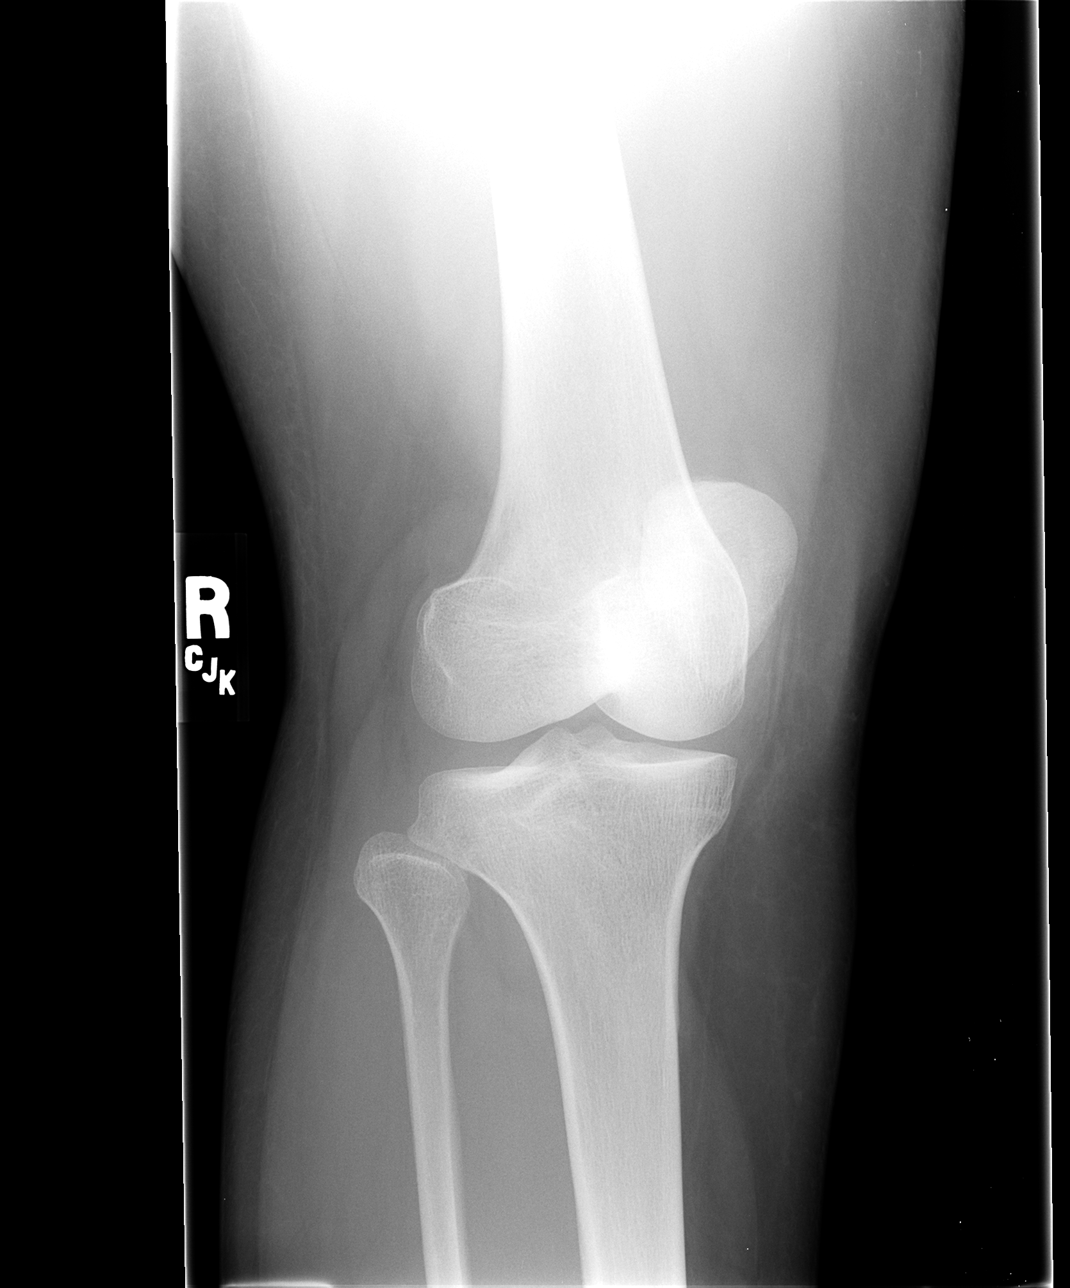

[view not recorded (4 of 4)]
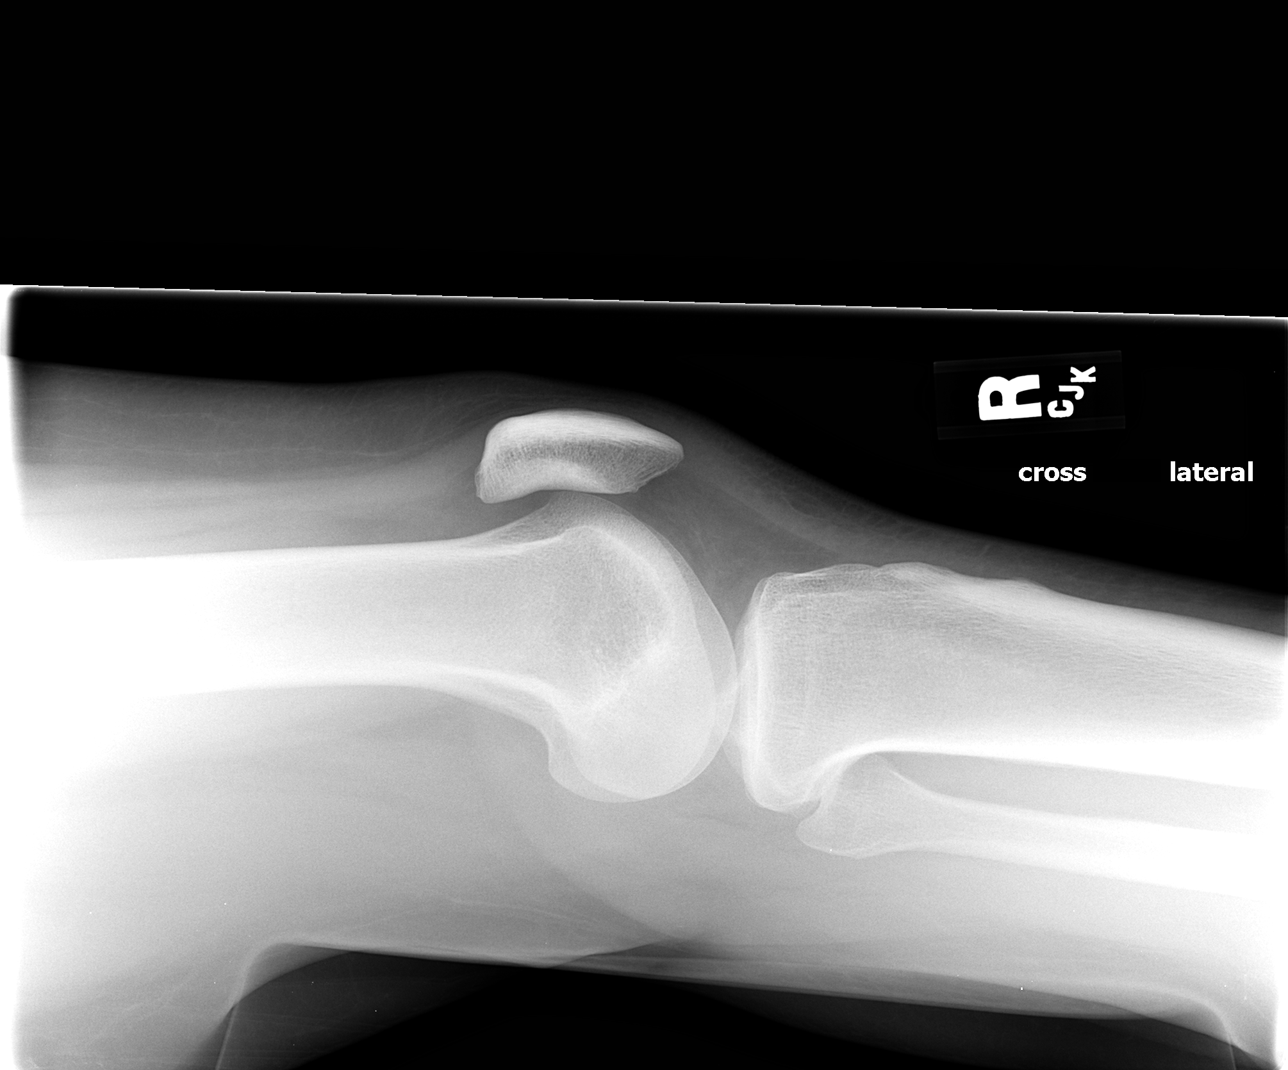

[4 of 4 positions shown; findings below may reference images not displayed]

FINDINGS: There is no evidence of an acute or subacute fracture or dislocation.
The joint spaces are well-preserved. There are no intrinsic osseous
abnormalities. There is no evidence of a significant joint effusion.
IMPRESSION: Normal examination.

## 2008-05-27 IMAGING — CR DG ABDOMEN 2V
2 series · 2 of 2 positions shown · non-contrast
Comparison: CT abdomen and pelvis [phone].

CLINICAL DATA: Nausea and vomiting.

PORTABLE ABDOMEN - 2 VIEW  [DATE]/[id number] hours:

[view not recorded (1 of 2)]
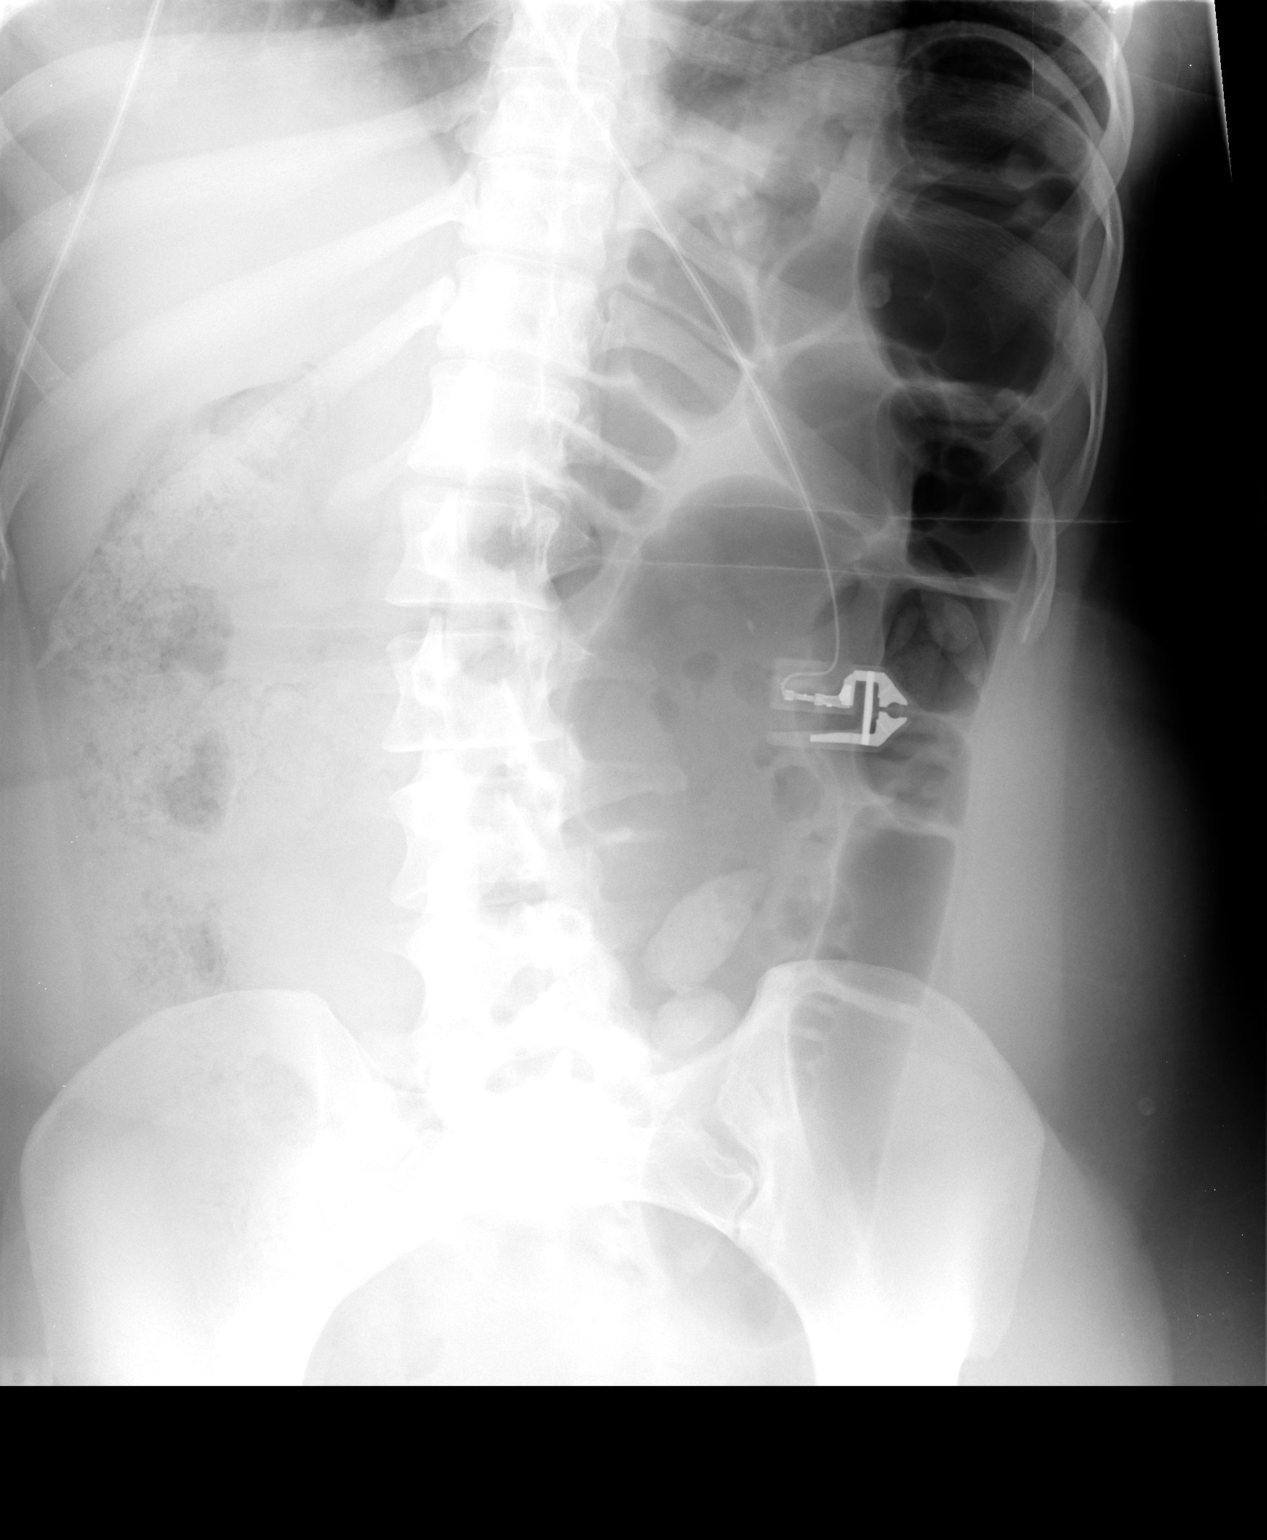

[view not recorded (2 of 2)]
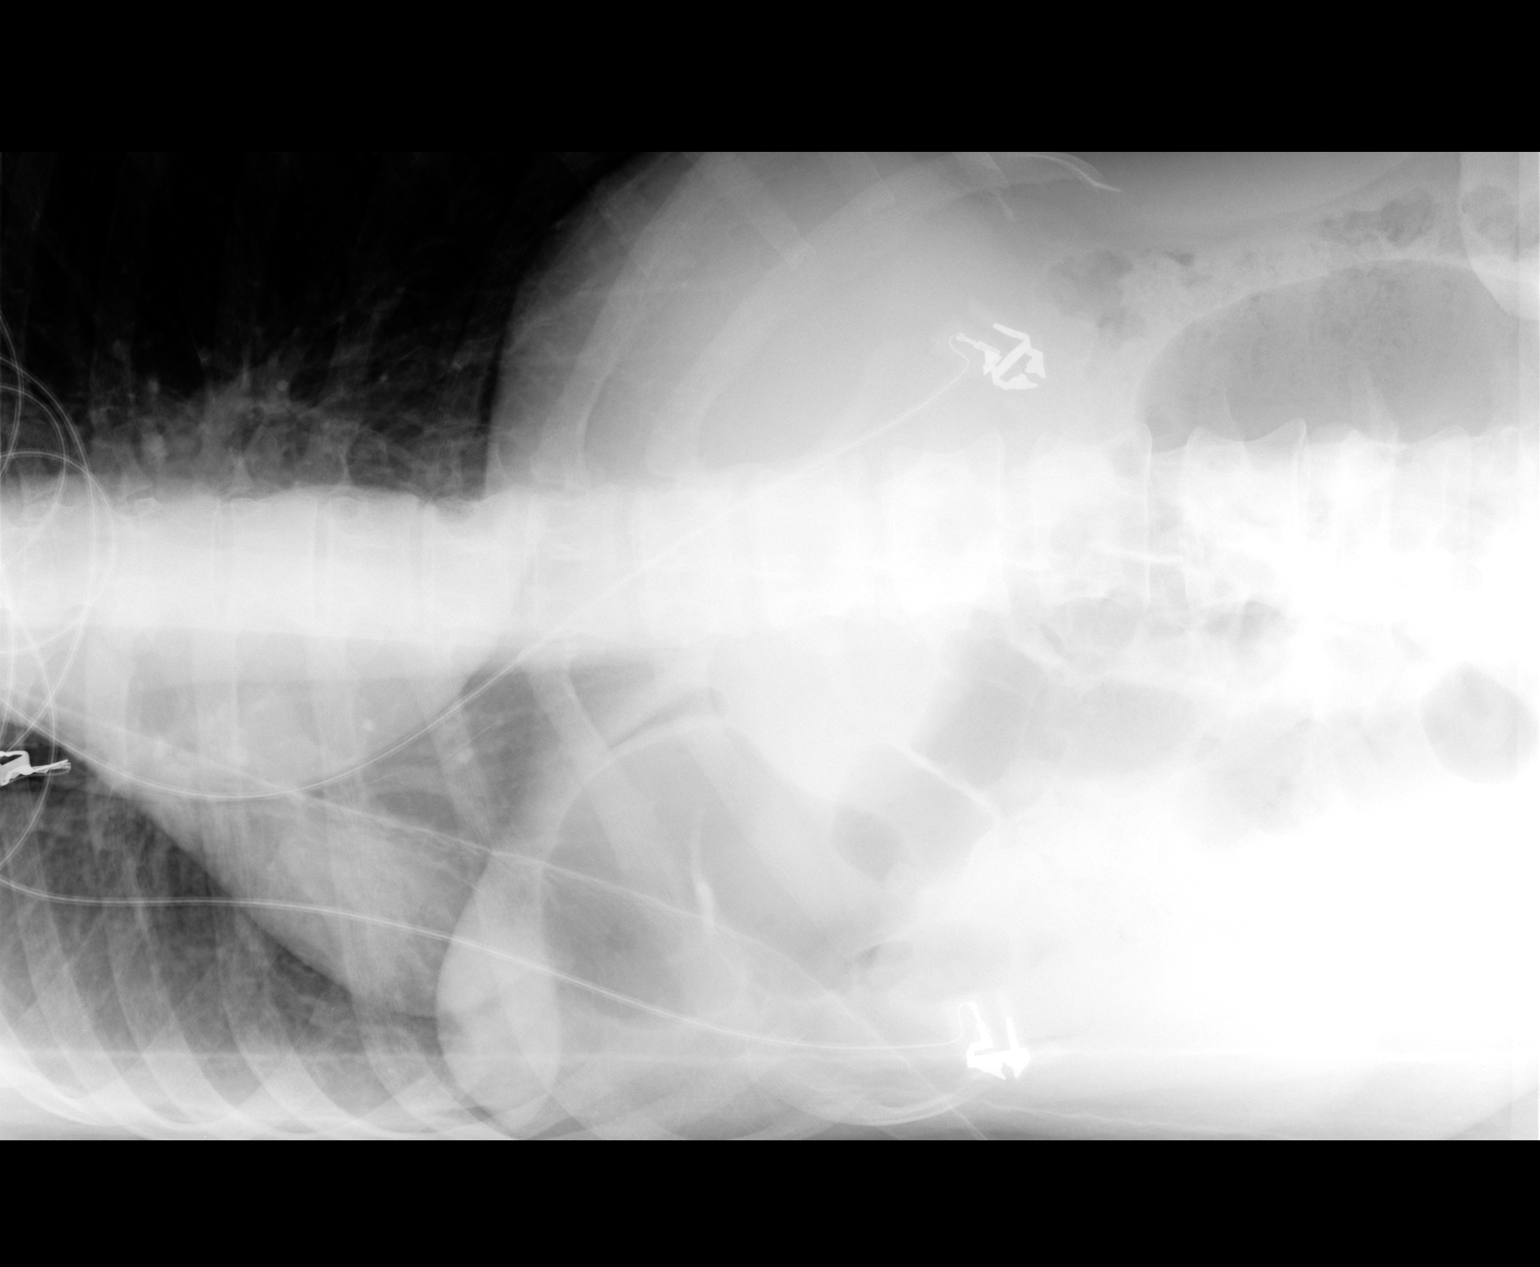

[2 of 2 positions shown; findings below may reference images not displayed]

FINDINGS: Mild gaseous distention of the sigmoid colon and the distal
transverse and descending colon. Moderate stool in the ascending colon. No
significant small bowel gas. Phleboliths in the pelvis. No free air on the
lateral decubitus image. Mild rotatory scoliosis convex right involving the
thoracolumbar spine.
IMPRESSION: Mild colonic ileus and constipation.

## 2008-05-27 IMAGING — MR MR HEAD WO/W CM
9 of 12 series · 31 of 48 positions shown · IV contrast (magnevist)
Comparison: CT head 09/26/07.

CLINICAL DATA: Seizure, headache, fall.
 MRI BRAIN WITHOUT AND WITH CONTRAST:
TECHNIQUE: Multiplanar and multiecho pulse sequences of the brain and surrounding structures were obtained according to standard protocol before and after administration of intravenous contrast.
 Contrast:  20 mL IV Magnevist.

[Series 2: 3 plane loc · axial · 5.0mm · 0.94mm/px · 1 of 9 slices shown]
[im 1/9]
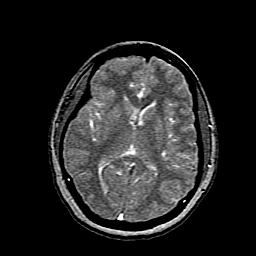

[Series 3: T1 · sagittal · 5.0mm · 0.43mm/px · 3 of 17 slices shown]
[im 1/17]
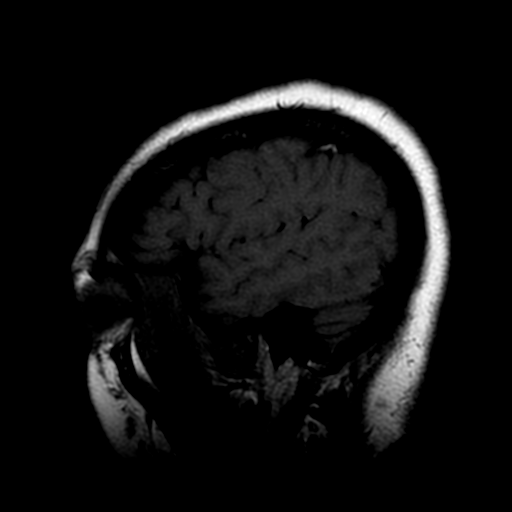
[im 9/17]
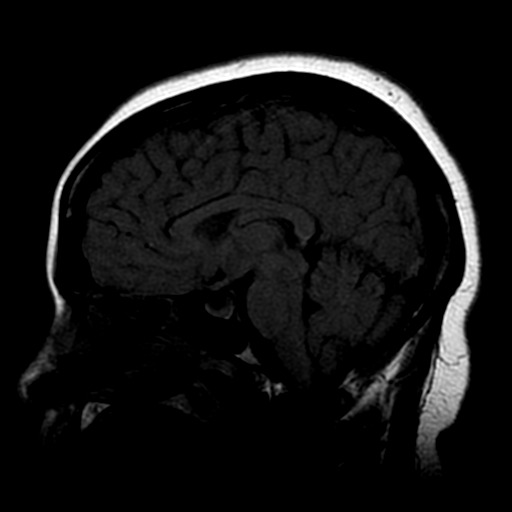
[im 17/17]
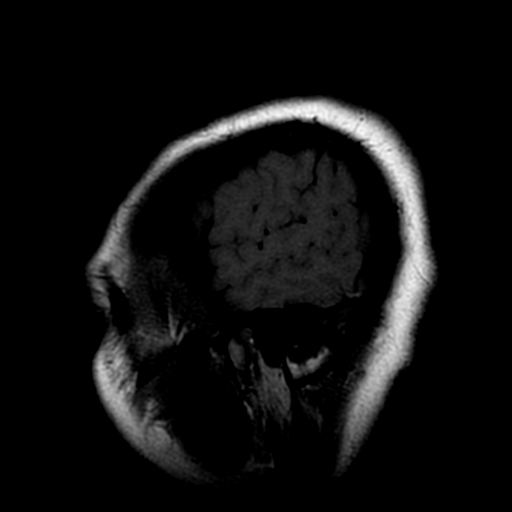

[Series 4: DWI · axial · 5.0mm · 1.41mm/px · z∈[-77,+50]mm · 7 of 48 slices shown (1 of 3)]
[im 1/48]
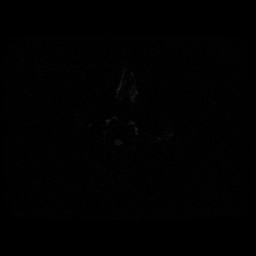
[im 8/48]
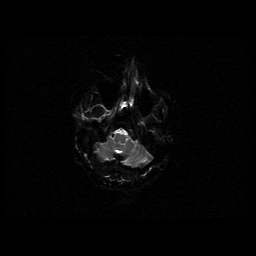
[im 16/48]
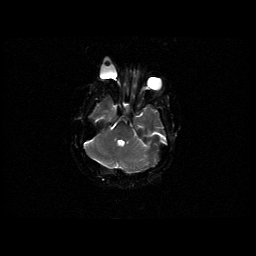
[im 24/48]
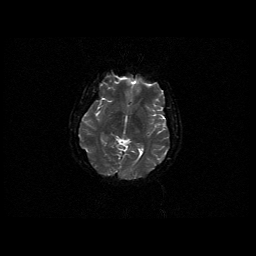
[im 32/48]
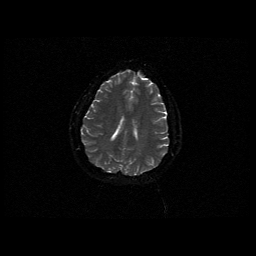
[im 40/48]
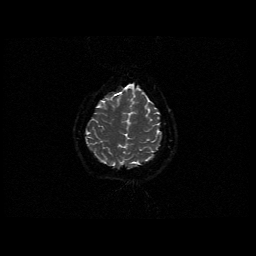
[im 48/48]
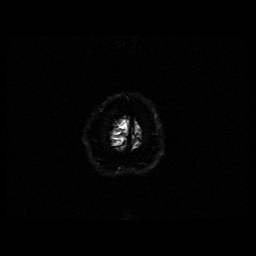

[Series 5: T2 · axial · 5.0mm · 0.43mm/px · z∈[-75,+64]mm · 3 of 21 slices shown (1 of 2)]
[im 1/21]
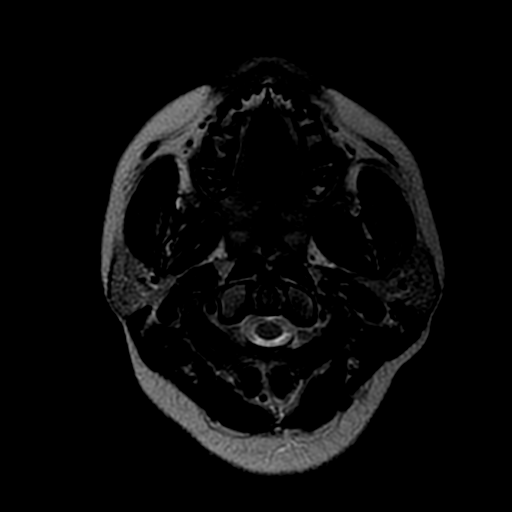
[im 11/21]
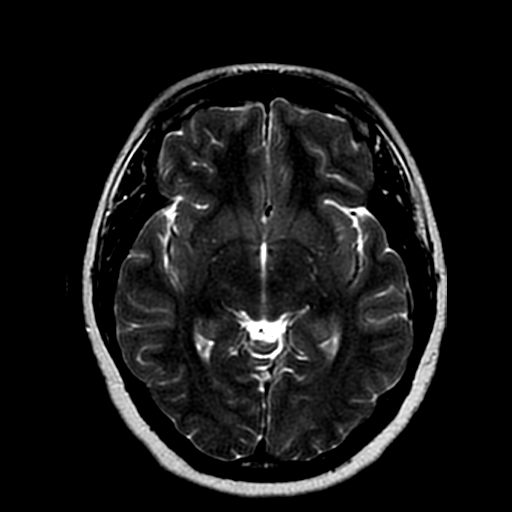
[im 21/21]
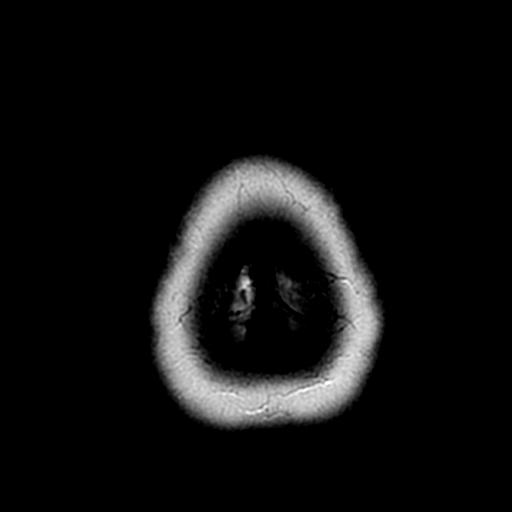

[Series 6: T2-star · axial · 5.0mm · 0.43mm/px · z∈[-75,+64]mm · 3 of 21 slices shown]
[im 1/21]
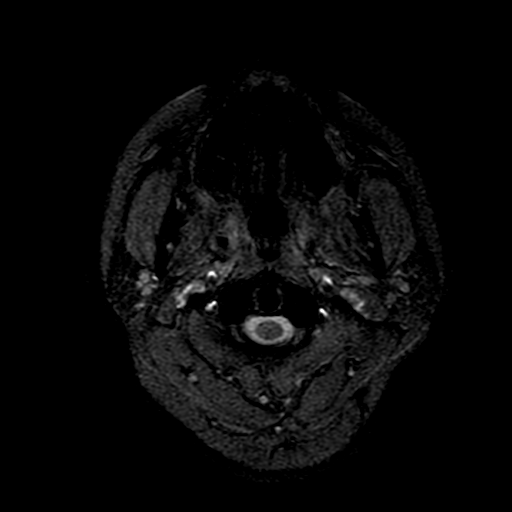
[im 11/21]
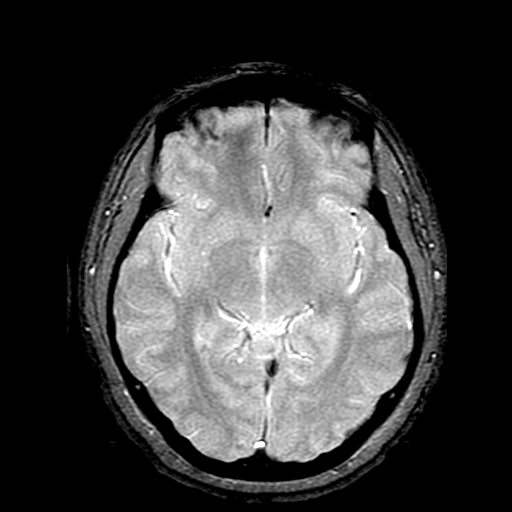
[im 21/21]
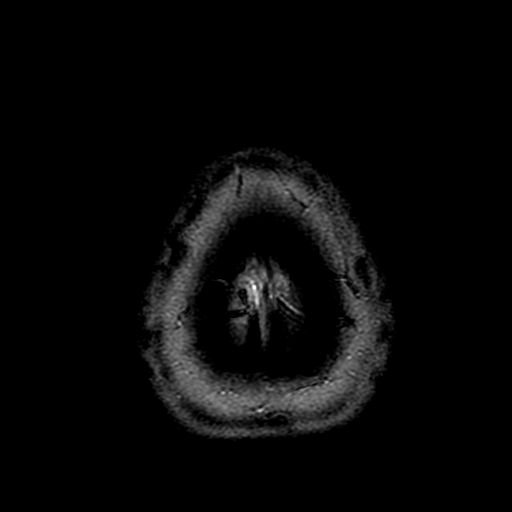

[Series 7: FLAIR · axial · 5.0mm · 0.43mm/px · z∈[-75,+64]mm · 3 of 21 slices shown]
[im 1/21]
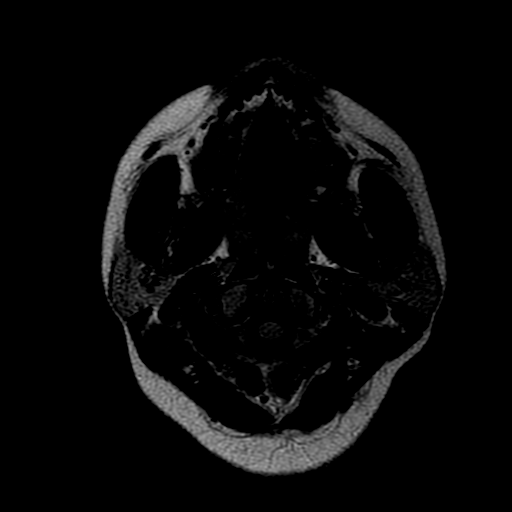
[im 11/21]
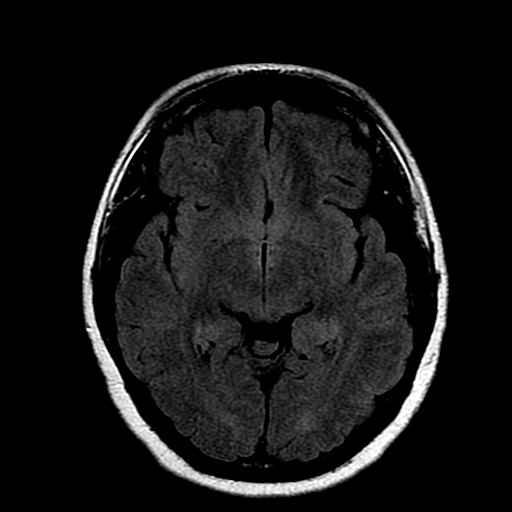
[im 21/21]
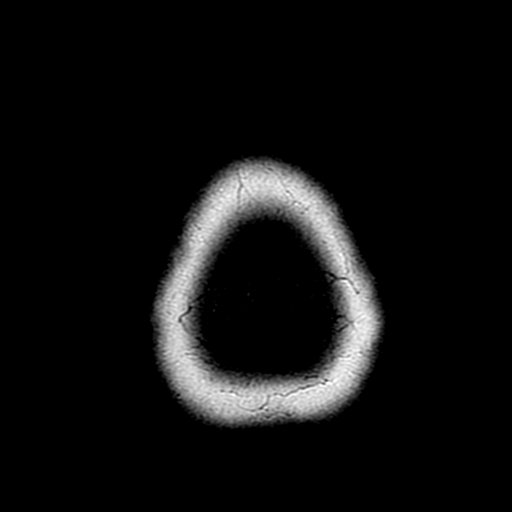

[Series 8: T2 · coronal · 3.0mm · 0.35mm/px · 3 of 19 slices shown (2 of 2)]
[im 1/19]
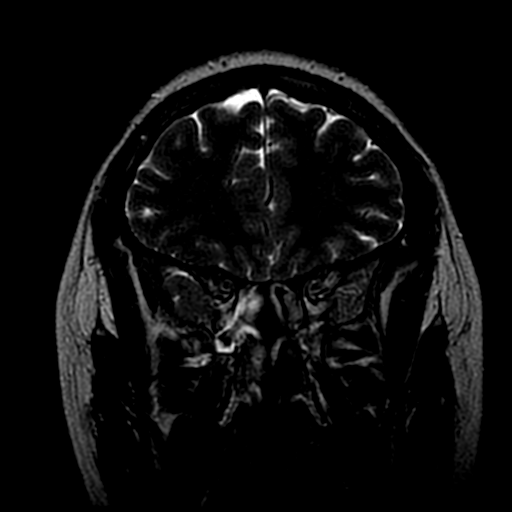
[im 10/19]
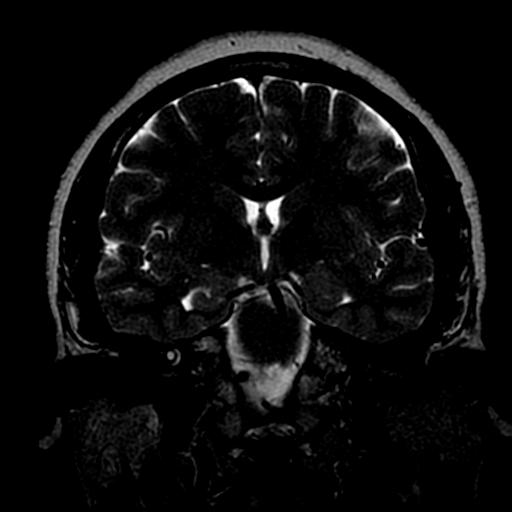
[im 19/19]
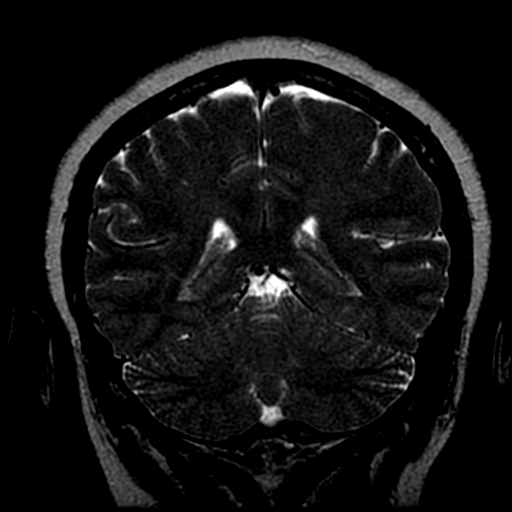

[Series 400: DWI · axial · 5.0mm · 1.41mm/px · z∈[-77,+50]mm · 4 of 24 slices shown (2 of 3)]
[im 1/24]
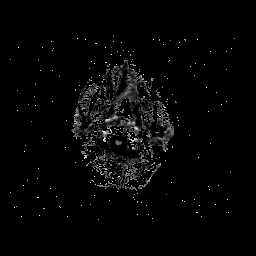
[im 8/24]
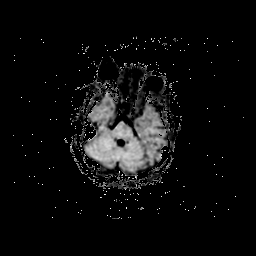
[im 16/24]
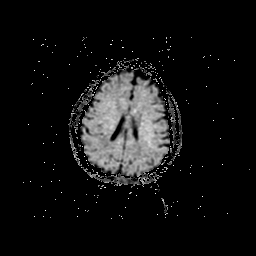
[im 24/24]
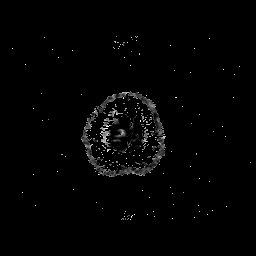

[Series 401: DWI · axial · 5.0mm · 1.41mm/px · z∈[-77,+50]mm · 4 of 24 slices shown (3 of 3)]
[im 1/24]
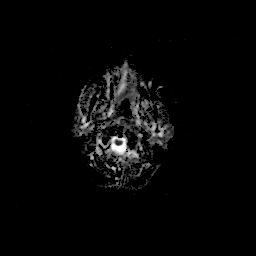
[im 8/24]
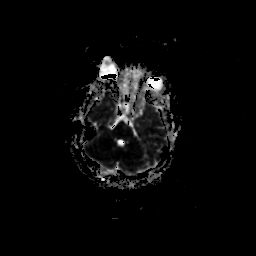
[im 16/24]
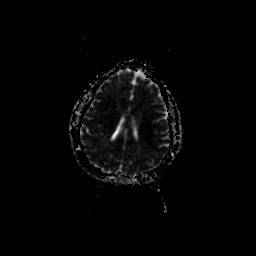
[im 24/24]
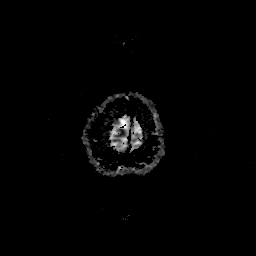

[31 of 48 positions shown; findings below may reference images not displayed]

FINDINGS: The ventricles are normal.  There is no infarct or mass lesion.  White matter has normal signal.  There is no intracranial hemorrhage or fluid collection.  The enhancement pattern is normal.  The temporal lobes appear normal bilaterally.  
 There is mild chronic sinusitis with mucosal thickening in the paranasal sinuses.
IMPRESSION: 1.  Normal MRI of the brain without and with contrast.
 2.  Mild chronic sinusitis.

## 2008-06-14 ENCOUNTER — Emergency Department (HOSPITAL_COMMUNITY): Admission: EM | Admit: 2008-06-14 | Discharge: 2008-06-14 | Payer: Self-pay | Admitting: Emergency Medicine

## 2008-06-30 ENCOUNTER — Emergency Department (HOSPITAL_COMMUNITY): Admission: EM | Admit: 2008-06-30 | Discharge: 2008-06-30 | Payer: Self-pay | Admitting: Emergency Medicine

## 2008-07-03 ENCOUNTER — Emergency Department (HOSPITAL_COMMUNITY): Admission: EM | Admit: 2008-07-03 | Discharge: 2008-07-03 | Payer: Self-pay | Admitting: Emergency Medicine

## 2008-07-17 IMAGING — CR DG ELBOW COMPLETE 3+V*R*
4 series · 4 of 4 positions shown · non-contrast
Comparison: none

CLINICAL DATA: Pain.
 RIGHT ELBOW ? 4 VIEW:

[x elbow joint obl. right]
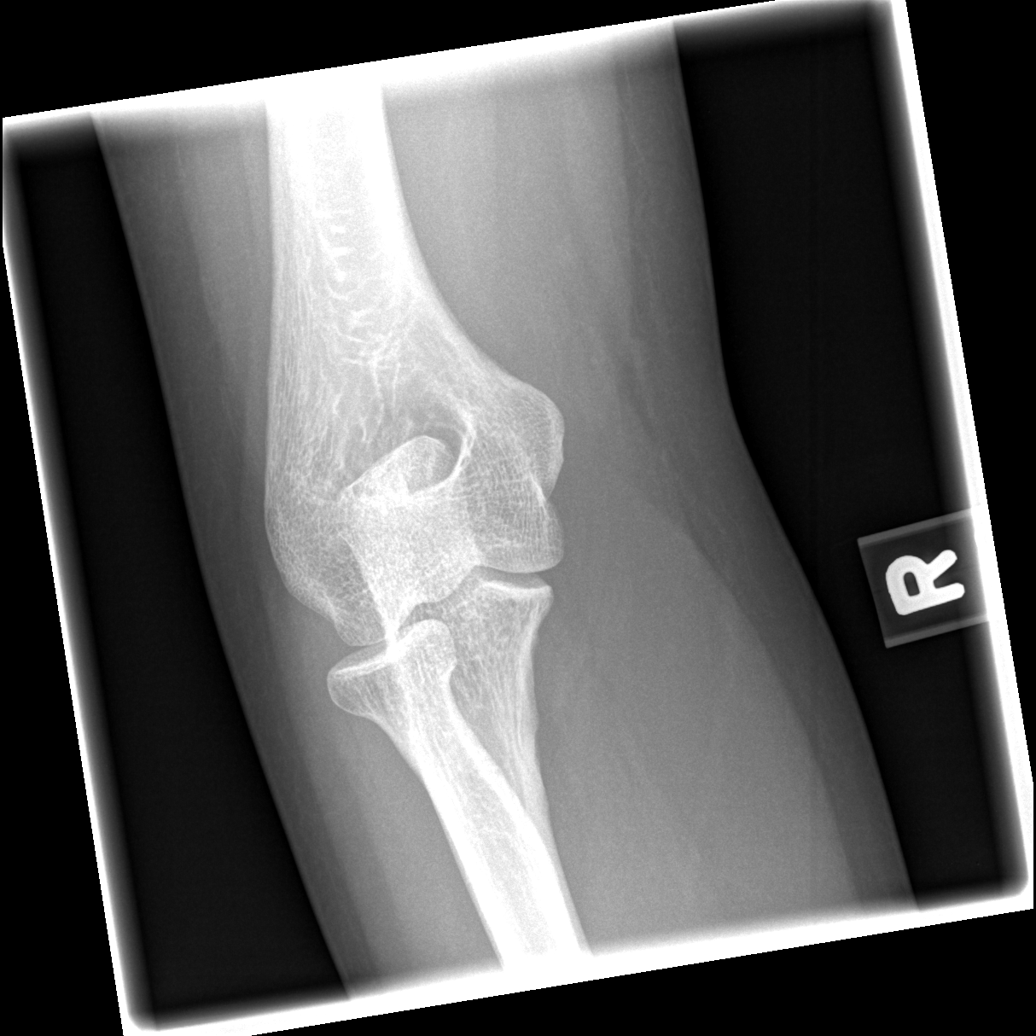

[x elbow joint ap right]
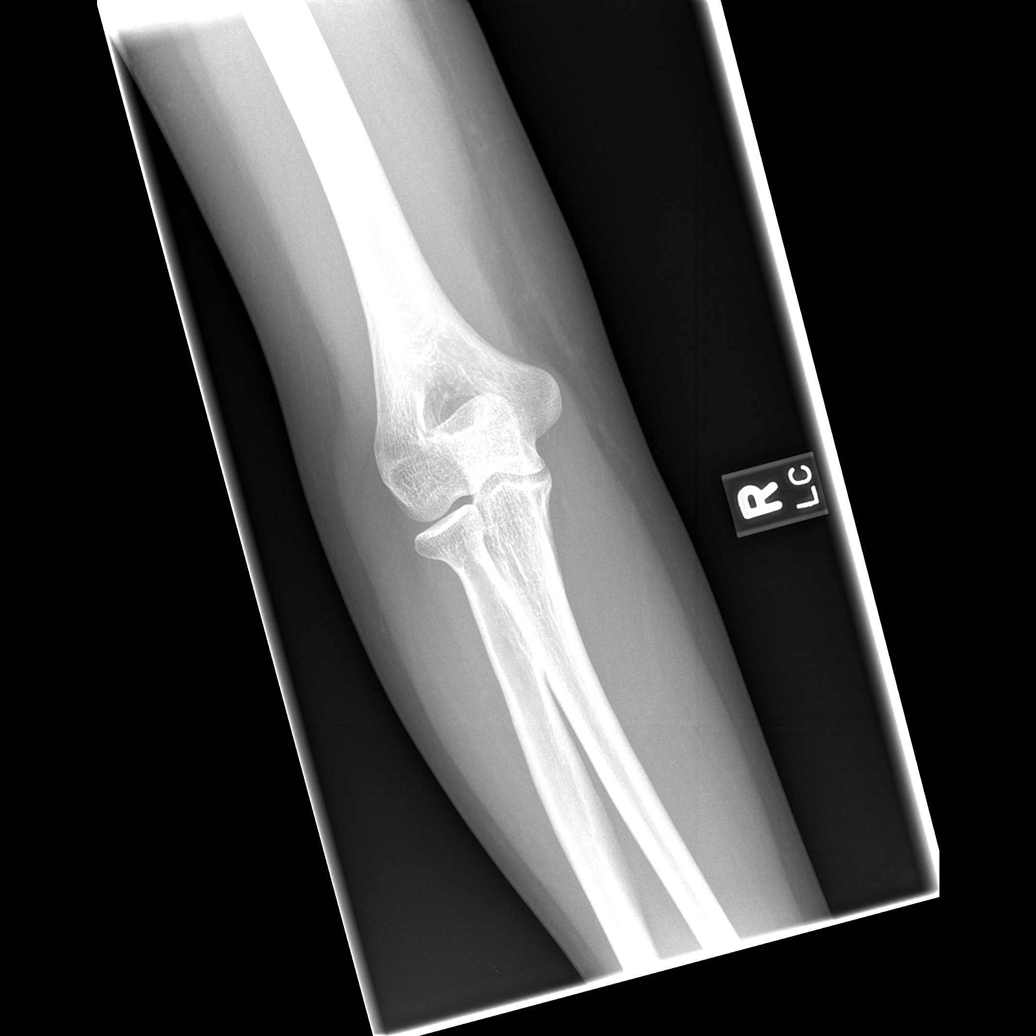

[x elbow joint lat right (1 of 2)]
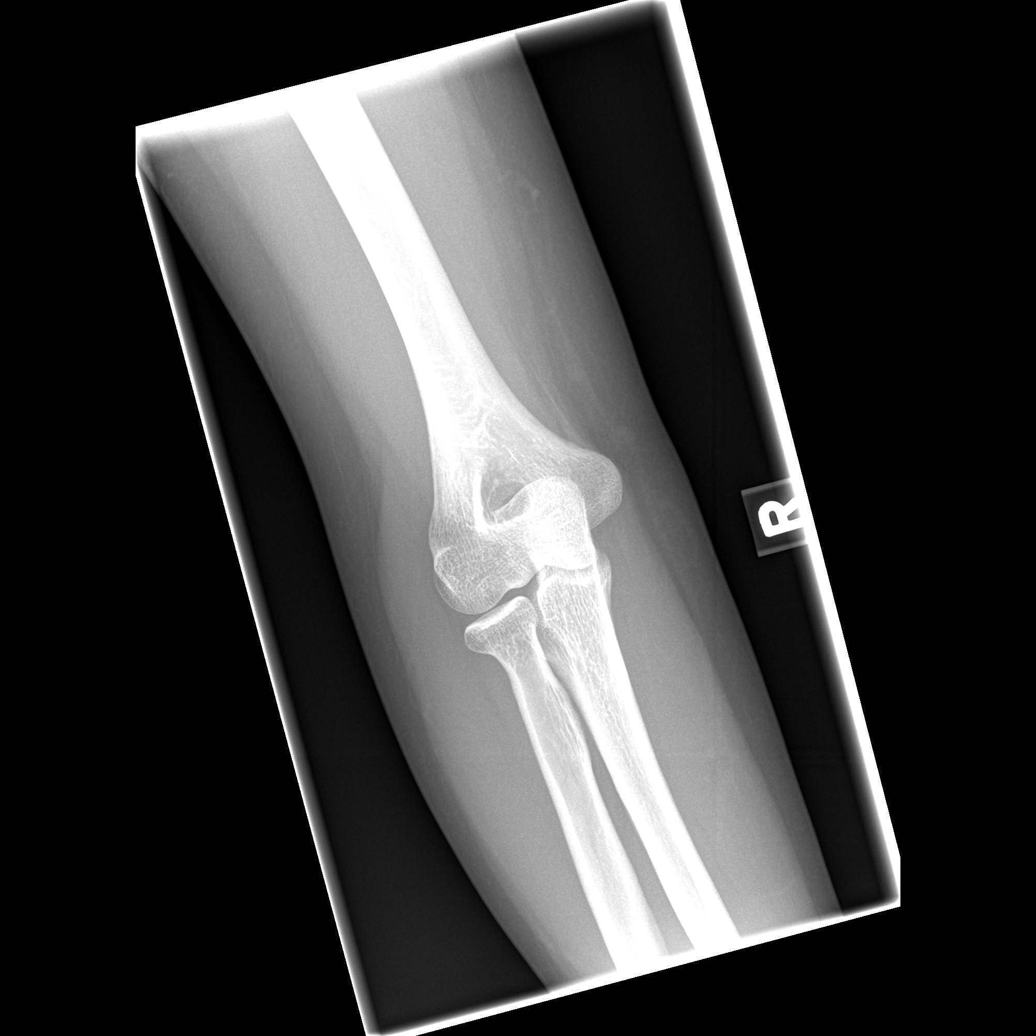

[x elbow joint lat right (2 of 2)]
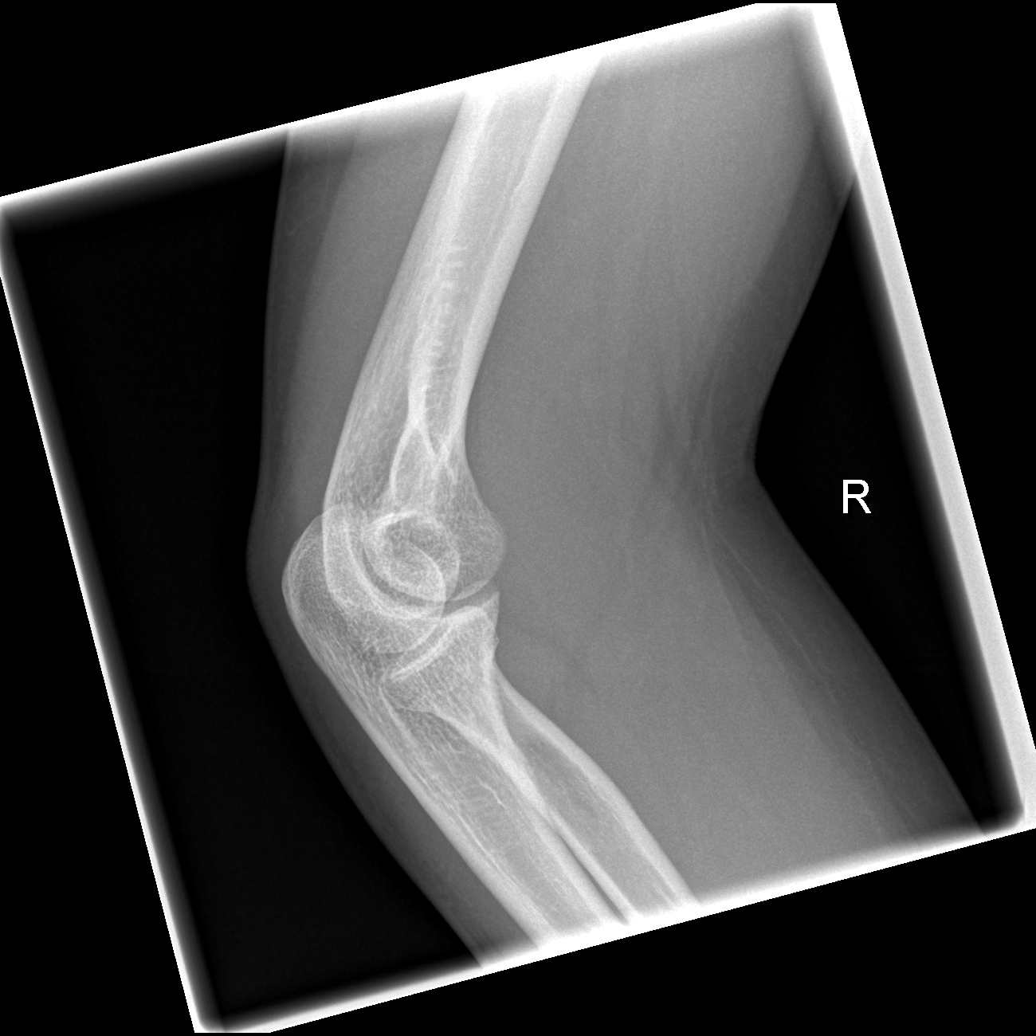

[4 of 4 positions shown; findings below may reference images not displayed]

FINDINGS: Imaged bones, joints, and soft tissues appear normal.
IMPRESSION: Negative exam.

## 2008-10-04 IMAGING — CR DG CHEST 2V
2 series · 2 of 2 positions shown · non-contrast
Comparison: 09/26/07

CLINICAL DATA: Cough, sore throat, and body aches.
 CHEST - 2 VIEW:

[w chest pa]
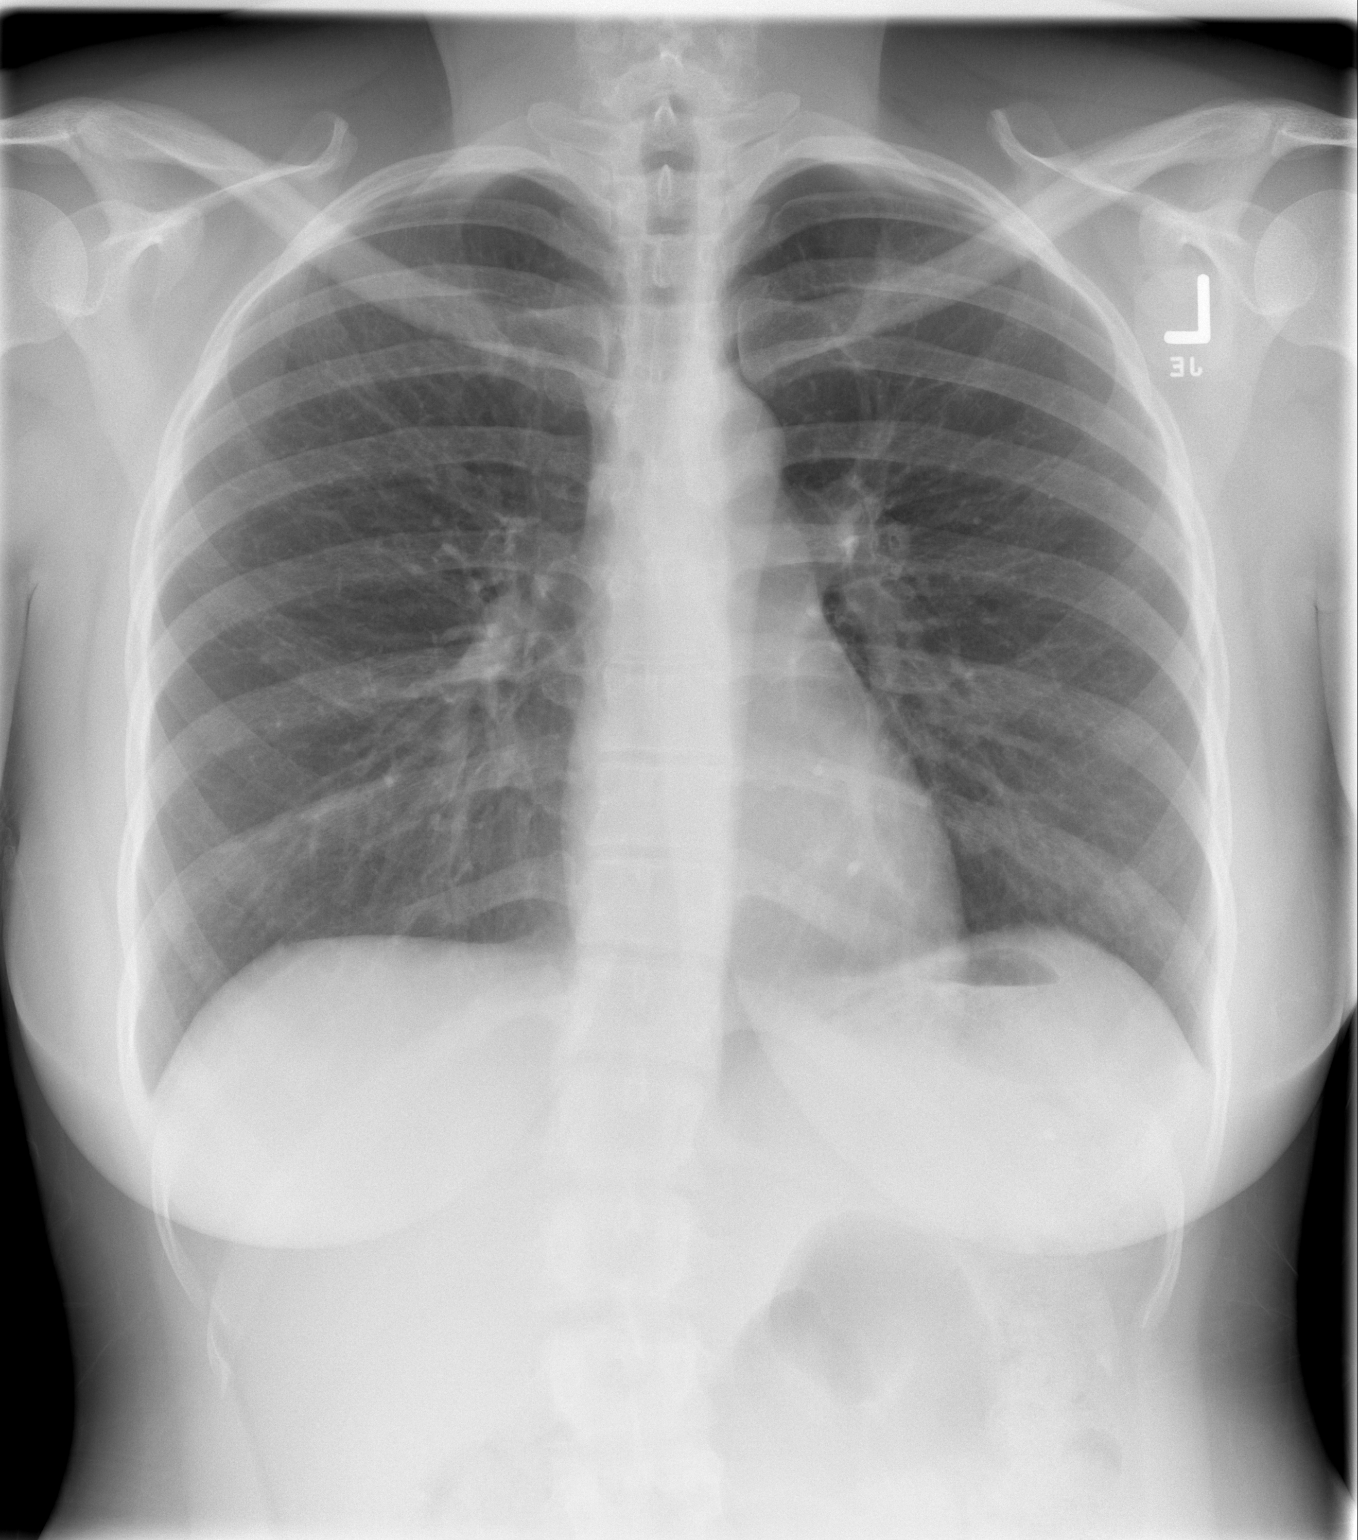

[w chest lat]
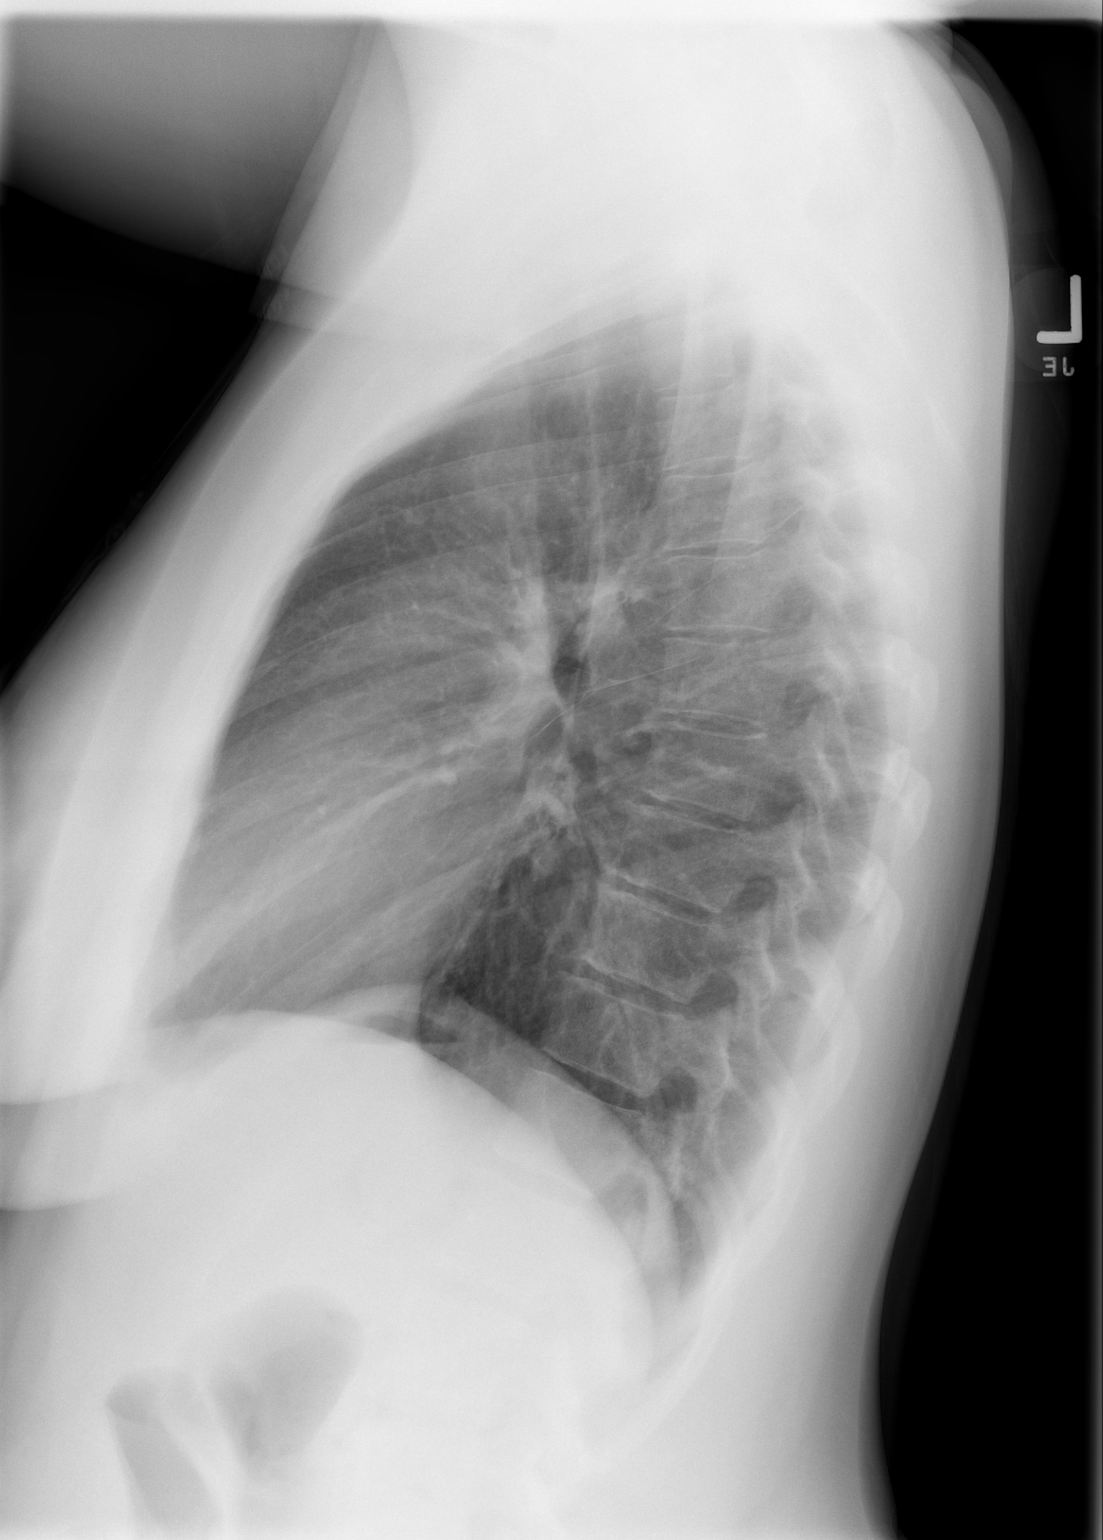

[2 of 2 positions shown; findings below may reference images not displayed]

FINDINGS: Heart size is normal. The mediastinum is unremarkable.  The lungs are clear. No effusions. No soft tissue or bony abnormality.
IMPRESSION: Normal chest.

## 2008-10-07 ENCOUNTER — Emergency Department (HOSPITAL_COMMUNITY): Admission: EM | Admit: 2008-10-07 | Discharge: 2008-10-08 | Payer: Self-pay | Admitting: Emergency Medicine

## 2008-10-14 ENCOUNTER — Emergency Department (HOSPITAL_COMMUNITY): Admission: EM | Admit: 2008-10-14 | Discharge: 2008-10-15 | Payer: Self-pay | Admitting: Emergency Medicine

## 2008-10-18 ENCOUNTER — Emergency Department (HOSPITAL_COMMUNITY): Admission: EM | Admit: 2008-10-18 | Discharge: 2008-10-18 | Payer: Self-pay | Admitting: Emergency Medicine

## 2008-10-18 IMAGING — CR DG CHEST 2V
2 series · 2 of 2 positions shown · non-contrast
Comparison: 02/06/08 and earlier

CLINICAL DATA: 32-year-old female with cold symptoms, congestion, and chest pain.  
 CHEST ? 2 VIEW:

[w chest pa]
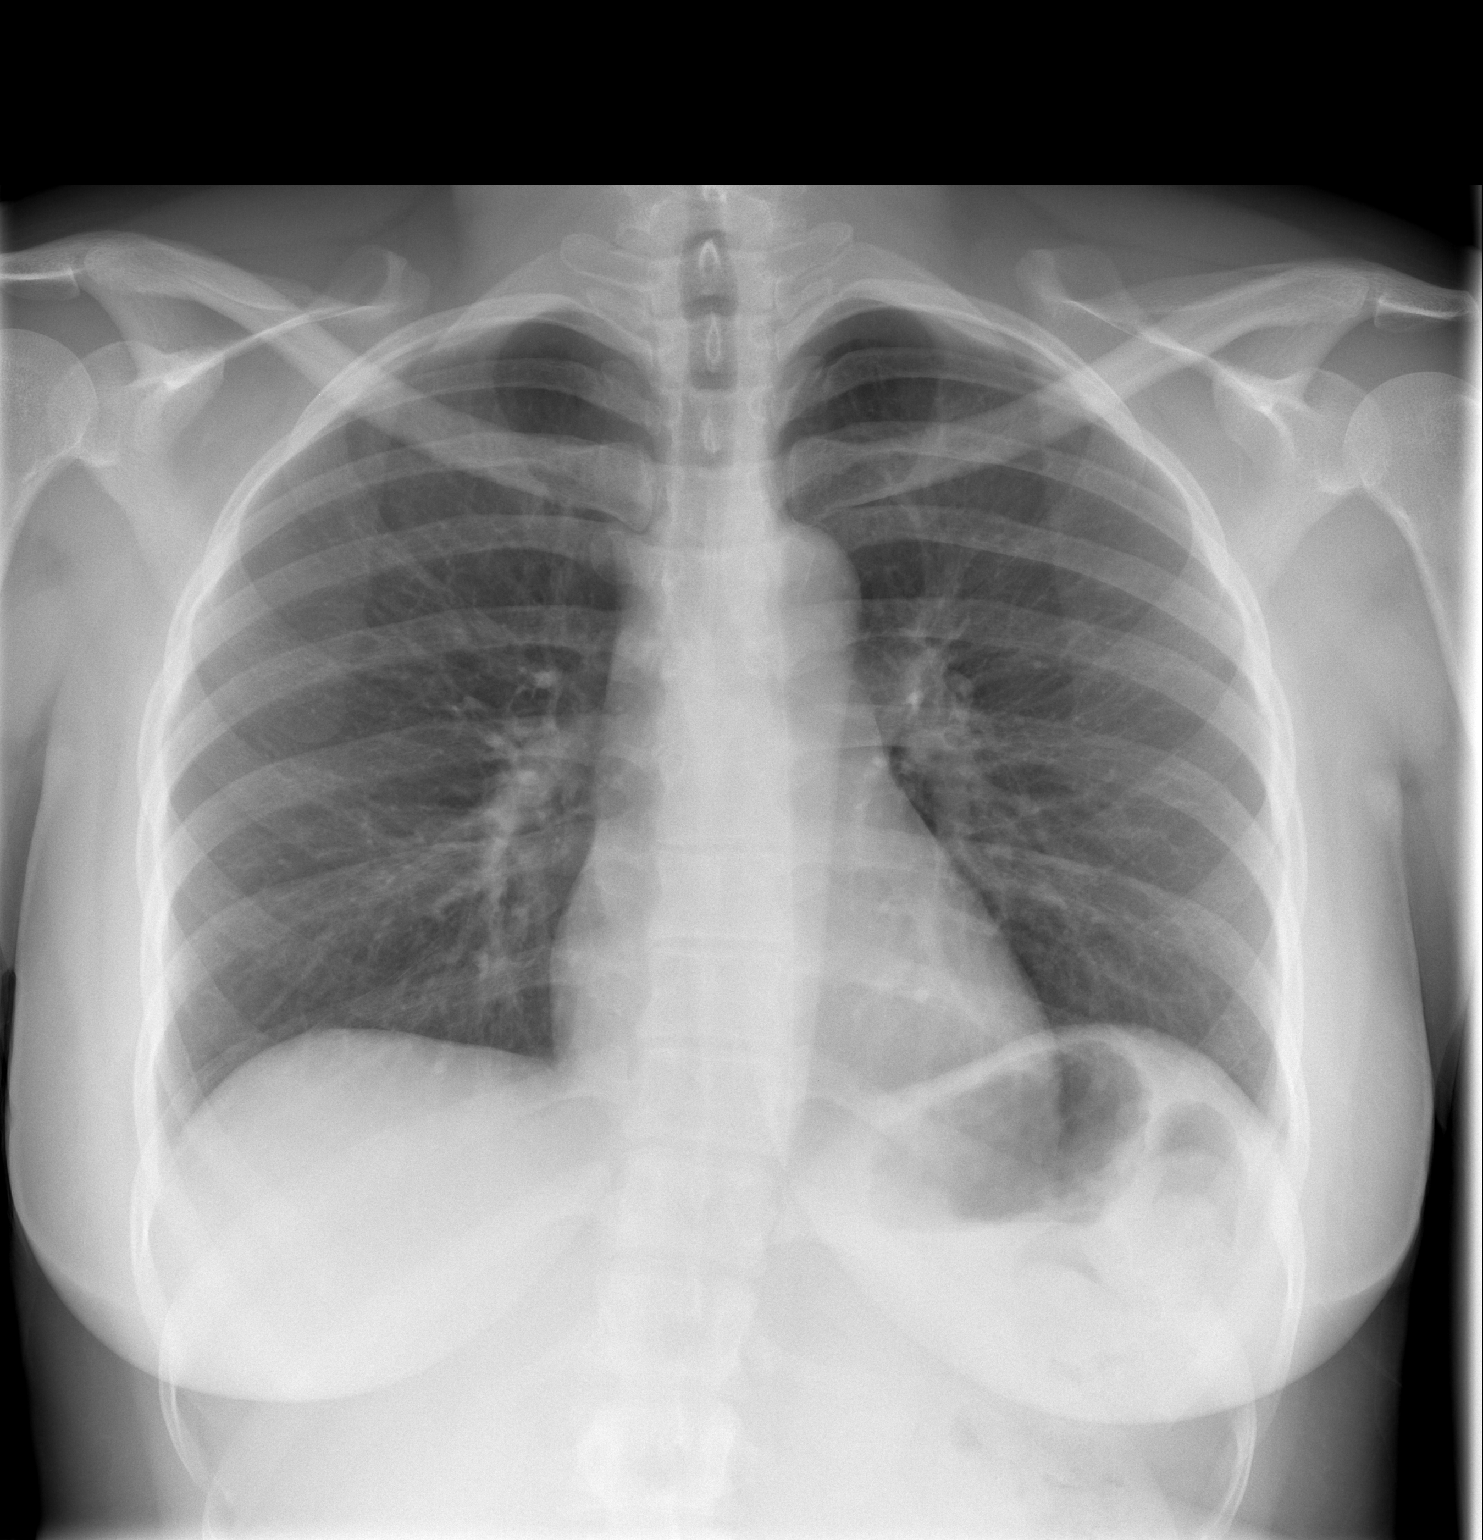

[w chest lat]
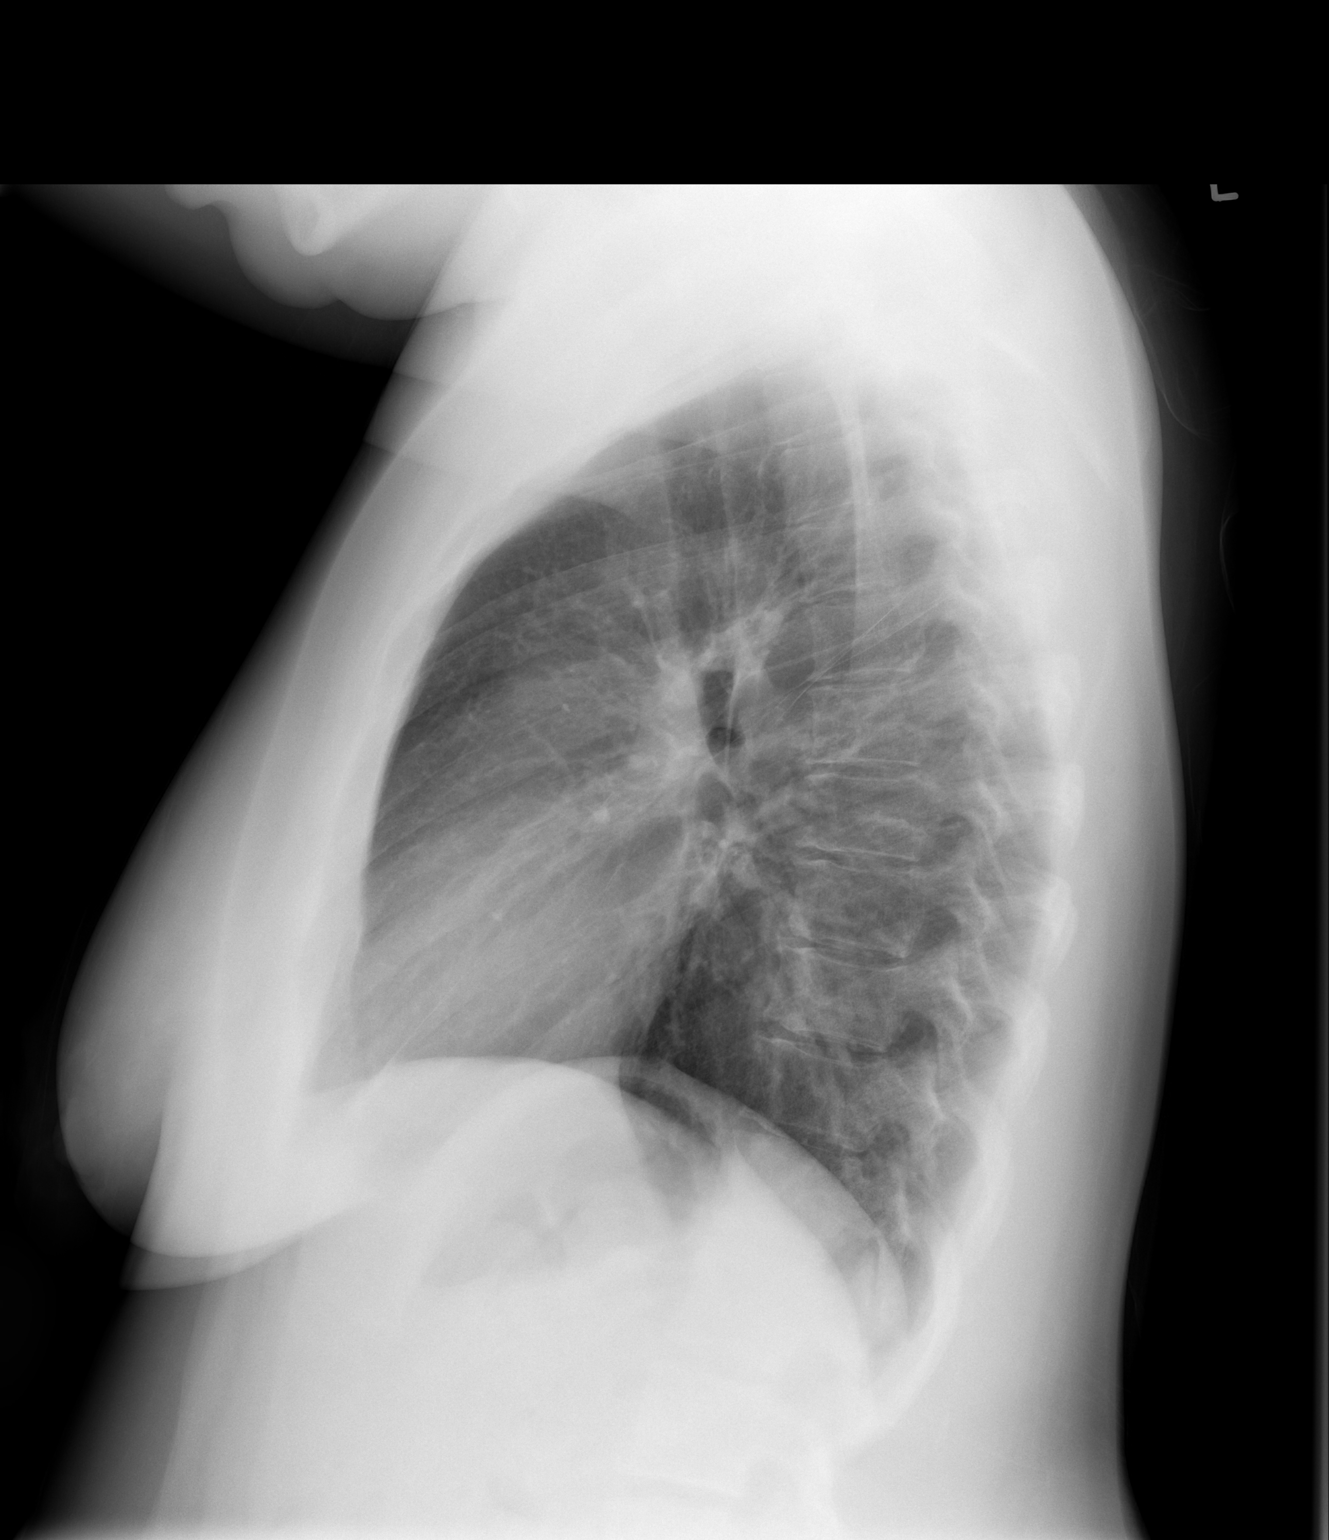

[2 of 2 positions shown; findings below may reference images not displayed]

FINDINGS: Normal cardiac size and mediastinal contour.  Slightly shallower lung volumes than on the prior exam, however the lungs remain clear.  No pleural effusion.  Visualized tracheal air column within normal limits on both views.  Stable mild scoliosis.  No acute osseous abnormality.
IMPRESSION: No acute cardiopulmonary abnormality.

## 2008-10-31 IMAGING — CR DG CHEST 2V
2 series · 2 of 2 positions shown · non-contrast
Comparison: 02/20/2008

CLINICAL DATA: *CHEST PAIN.

CHEST - 2 VIEW

[view not recorded (1 of 2)]
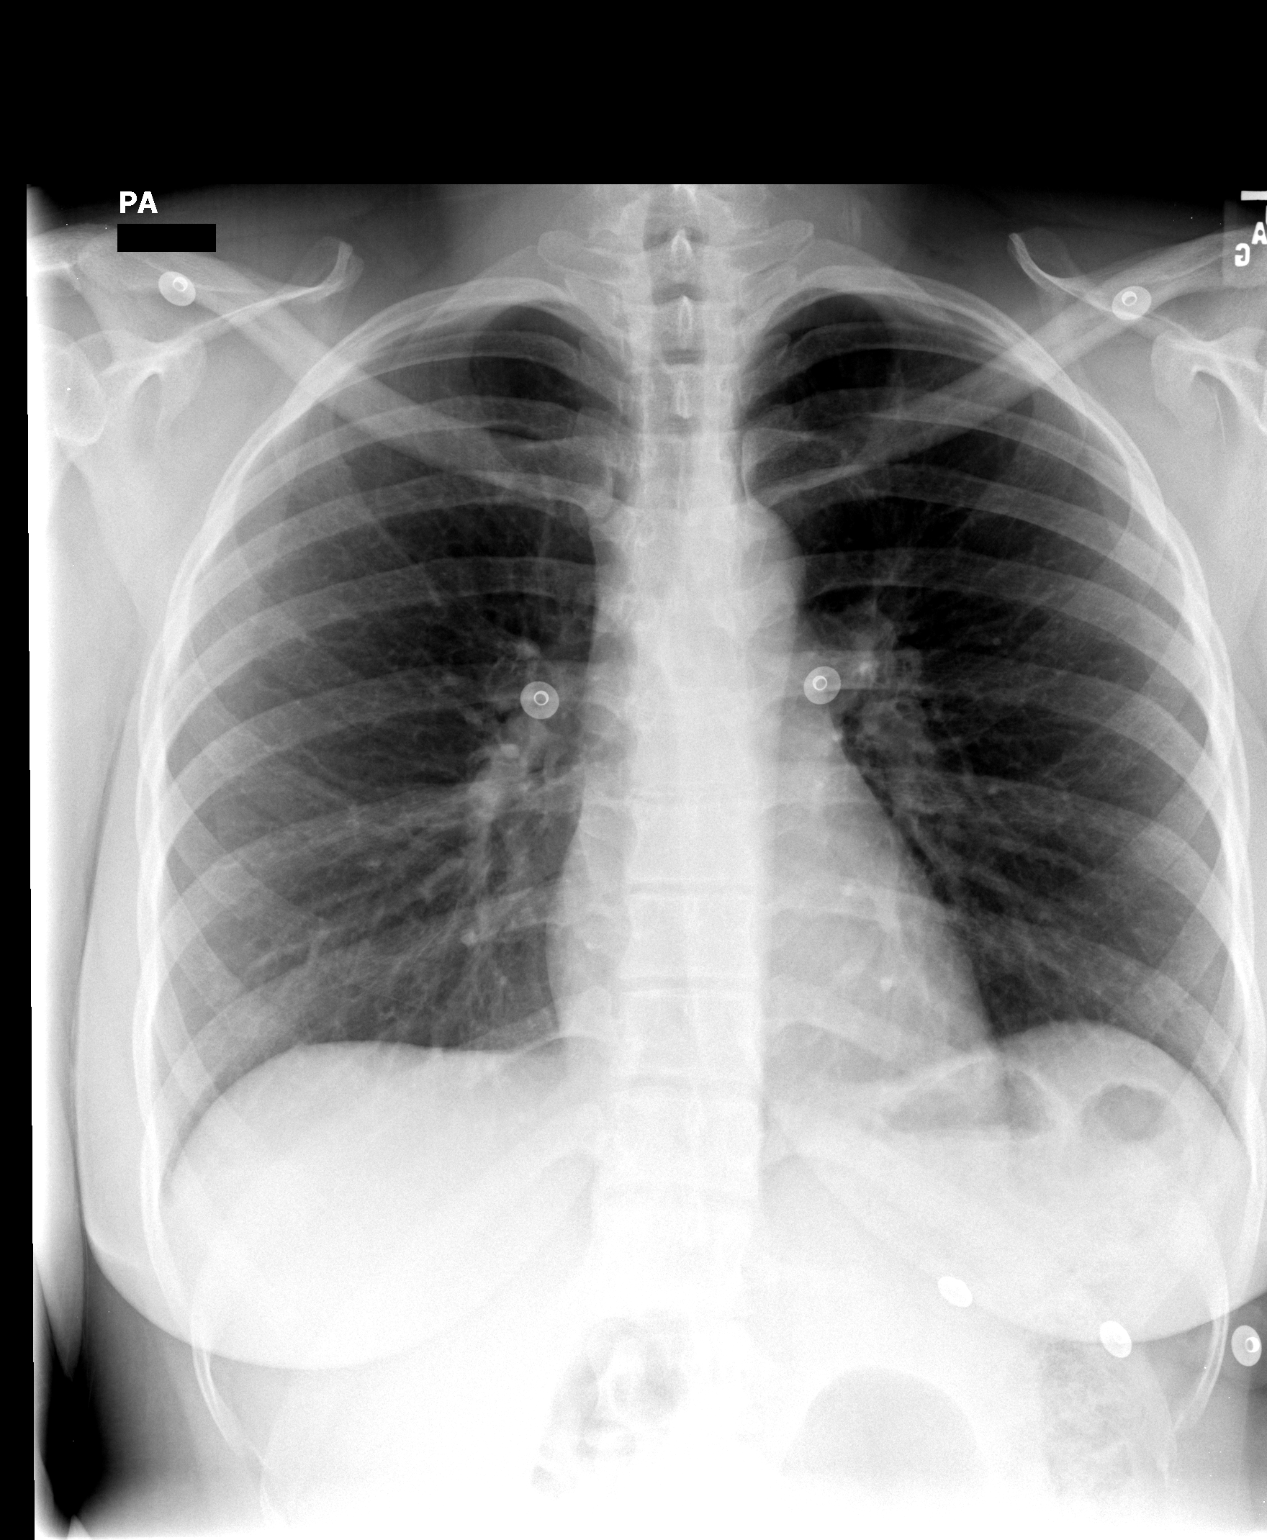

[view not recorded (2 of 2)]
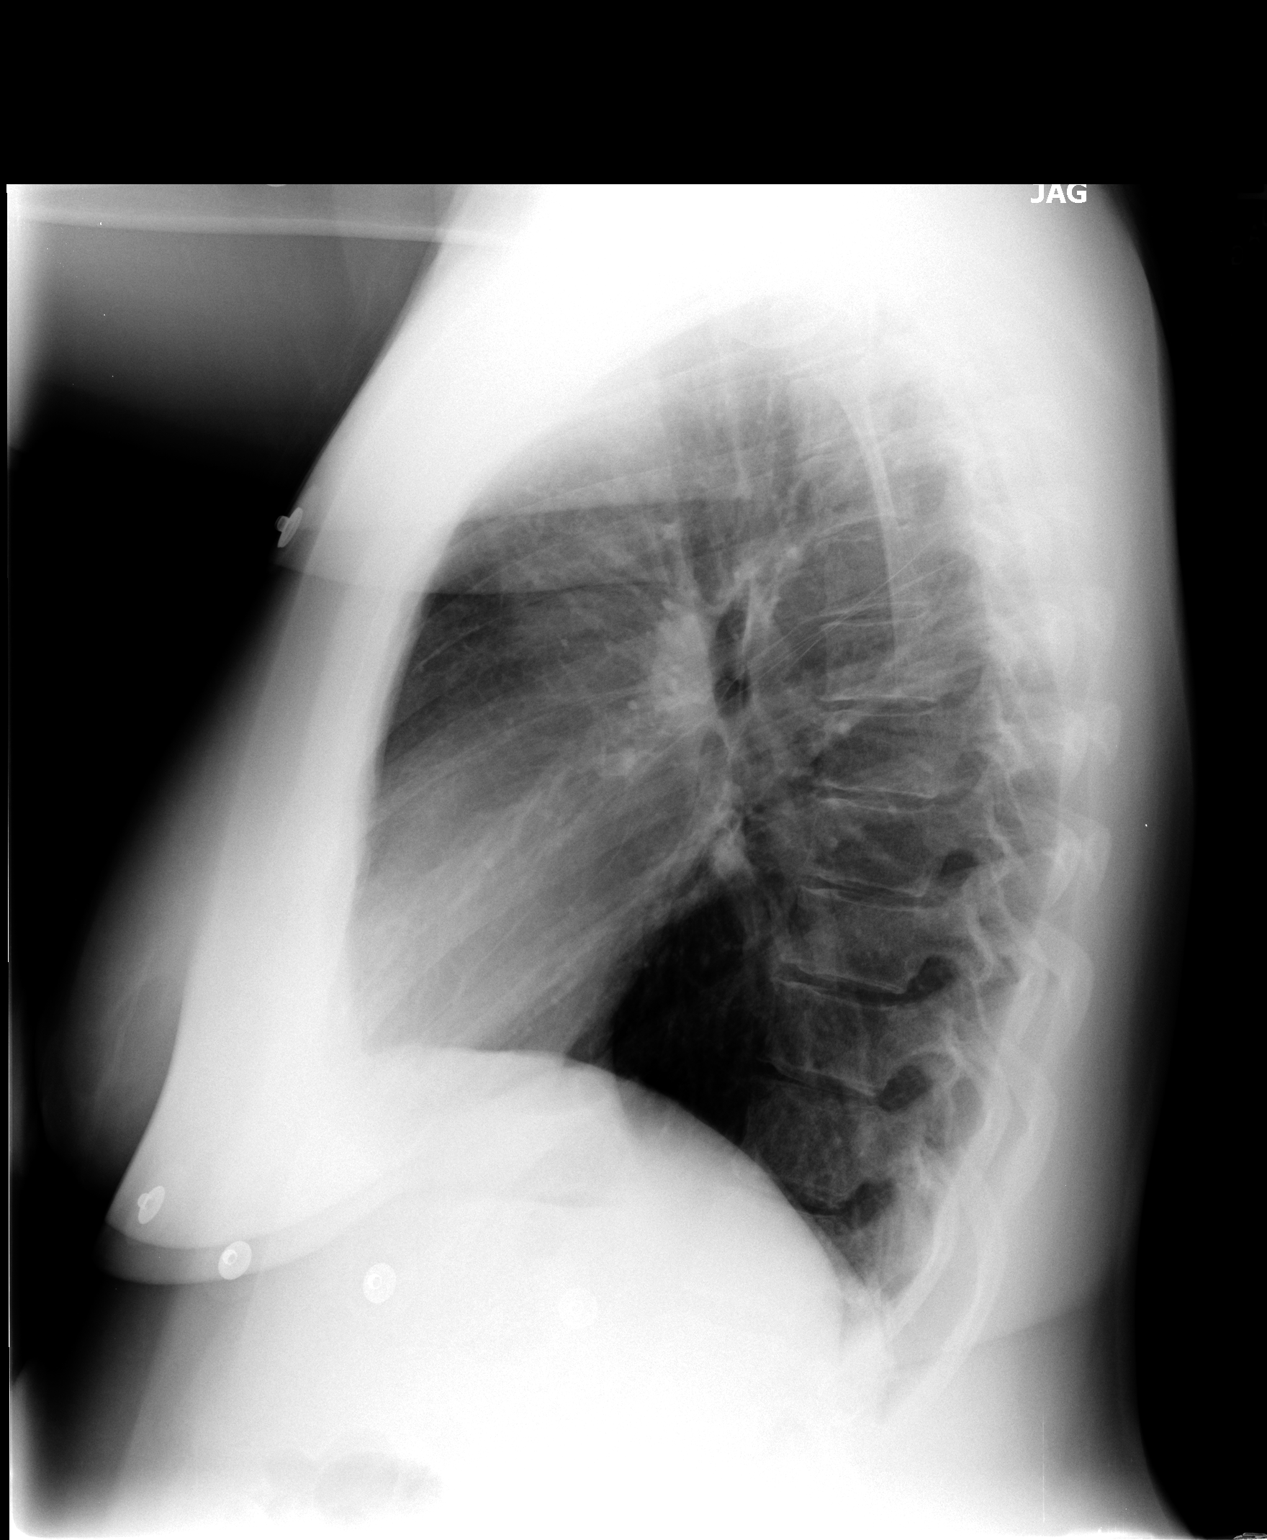

[2 of 2 positions shown; findings below may reference images not displayed]

FINDINGS: The heart is normal in size.  Bronchitic changes are
stable.  Lungs are otherwise clear.  No pneumothoraces or effusions
are seen.
IMPRESSION: Bronchitic changes.

## 2008-12-02 IMAGING — CT CT HEAD W/O CM
1 series · 16 of 28 positions shown, 20 images · non-contrast
Comparison: 09/26/2007 and brain MR dated 09/29/2007.

CLINICAL DATA: Headache.  Nausea and vomiting.  History of
seizures and migraine headaches

CT HEAD WITHOUT CONTRAST
TECHNIQUE: Contiguous axial images were obtained from the base of
the skull through the vertex without contrast.

[Series 2: brain · axial · 0.47mm/px · z∈[+106,+232]mm · 16 of 28 slices shown, 20 images]
[im 2/28  brain]
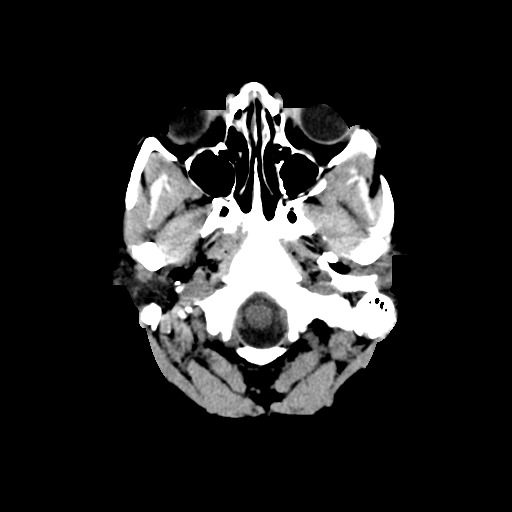
[im 2/28  bone]
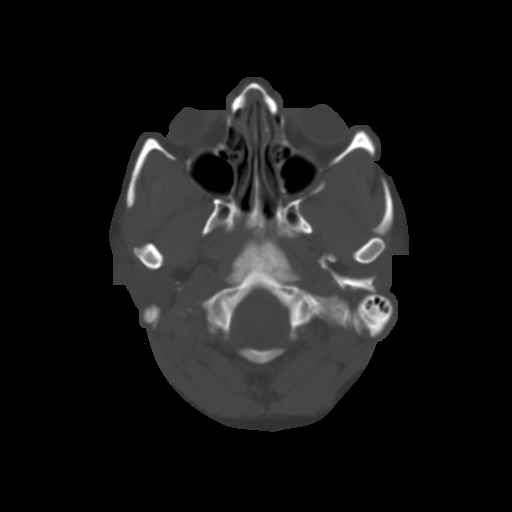
[im 4/28  brain]
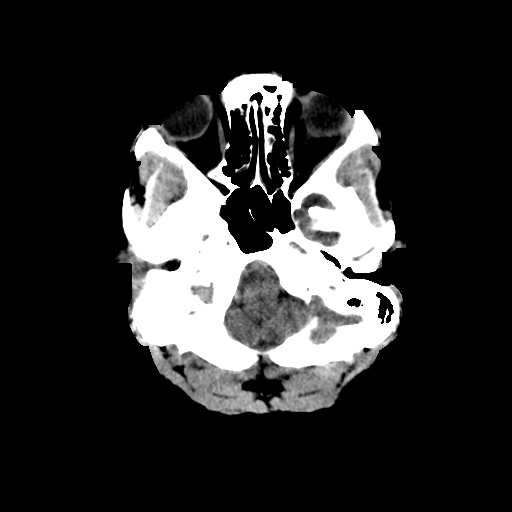
[im 6/28  brain]
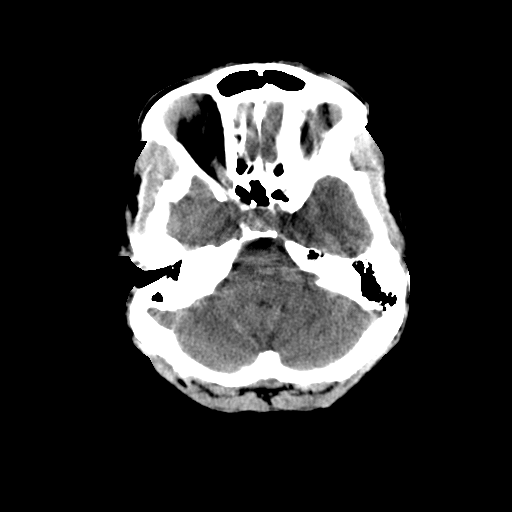
[im 7/28  brain]
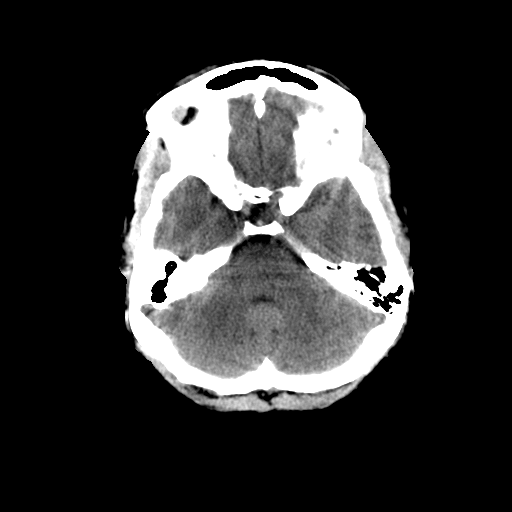
[im 9/28  brain]
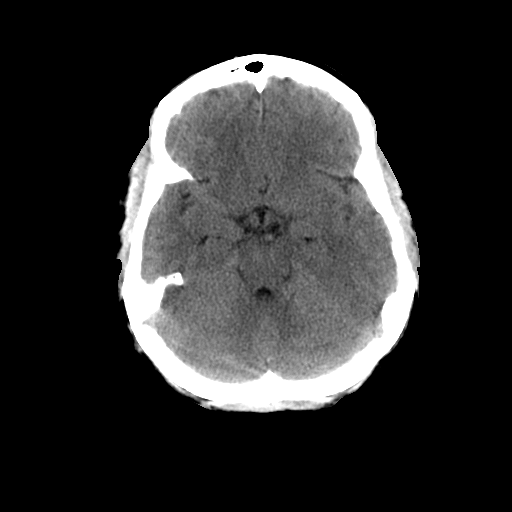
[im 9/28  bone]
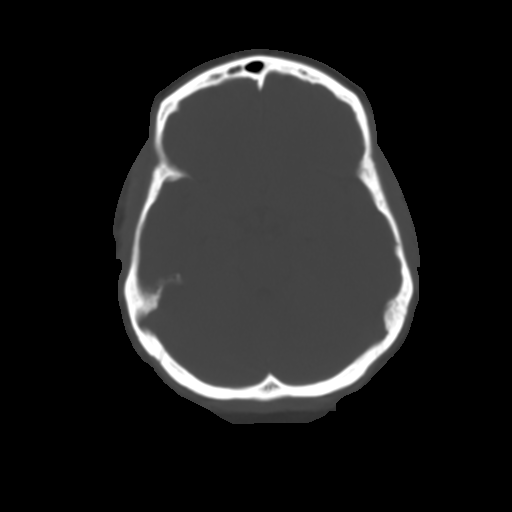
[im 10/28  brain]
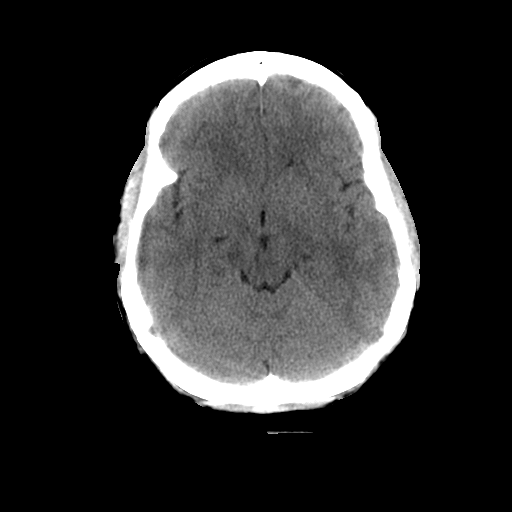
[im 12/28  brain]
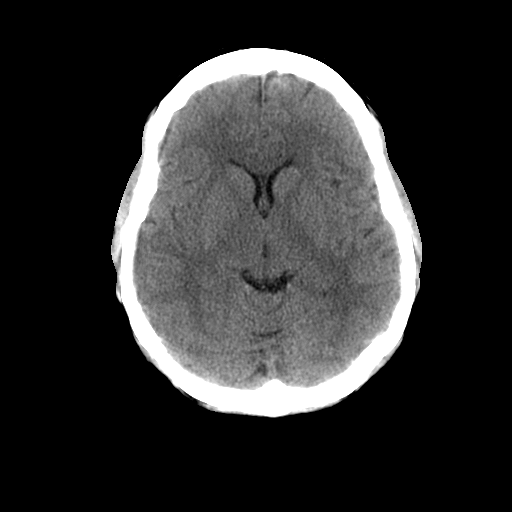
[im 14/28  brain]
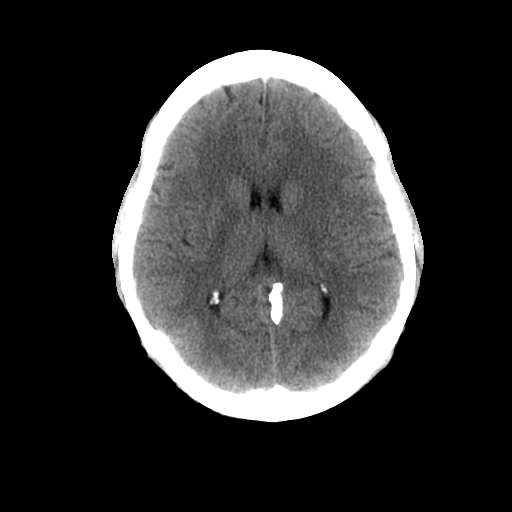
[im 15/28  brain]
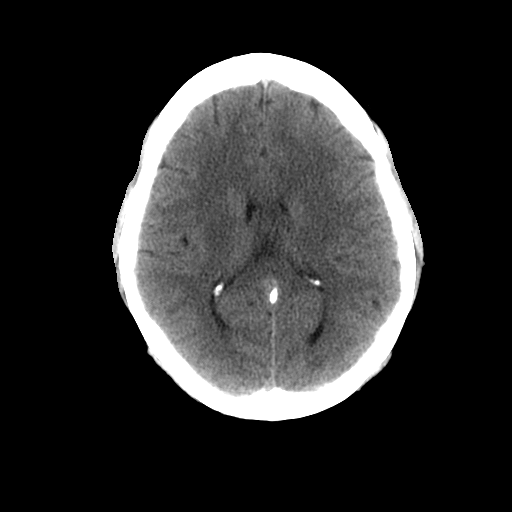
[im 15/28  bone]
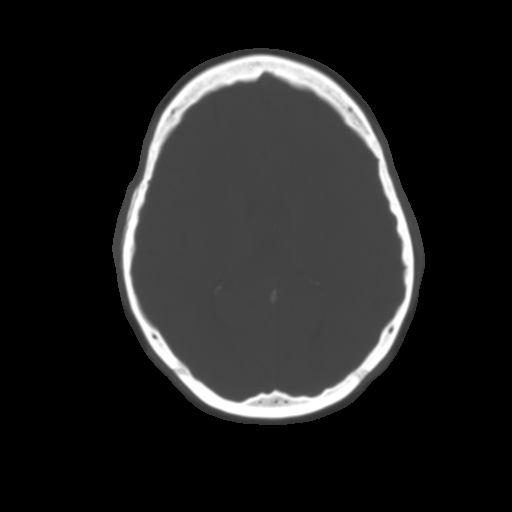
[im 17/28  brain]
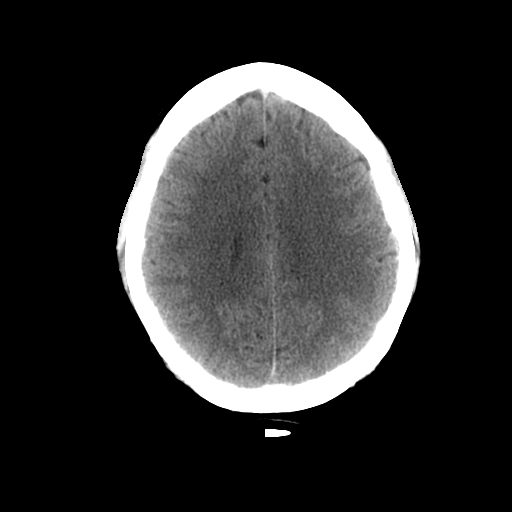
[im 19/28  brain]
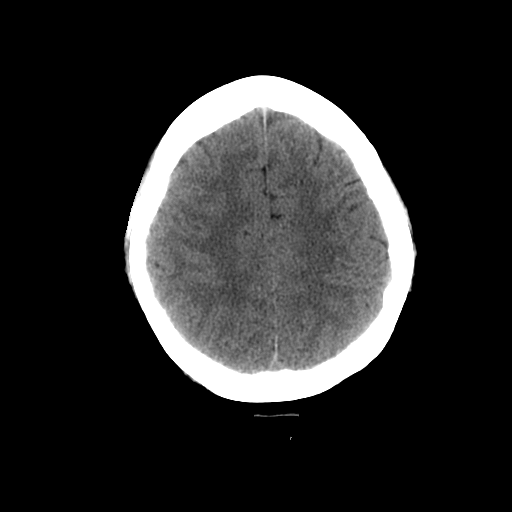
[im 20/28  brain]
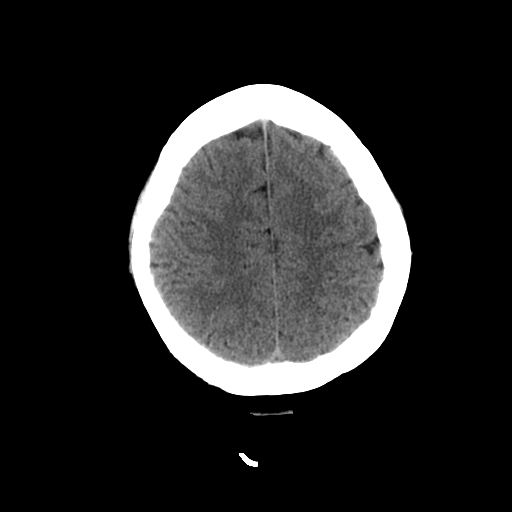
[im 22/28  brain]
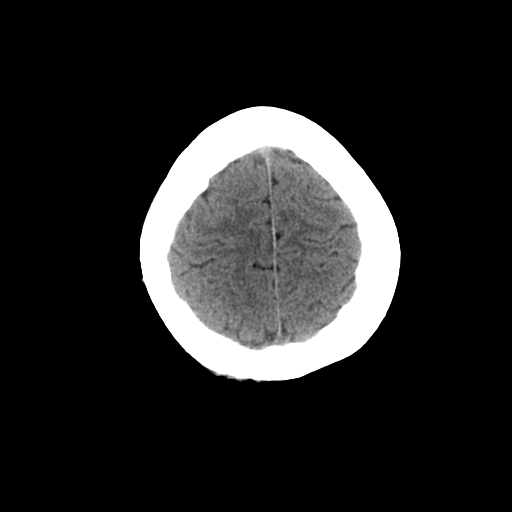
[im 22/28  bone]
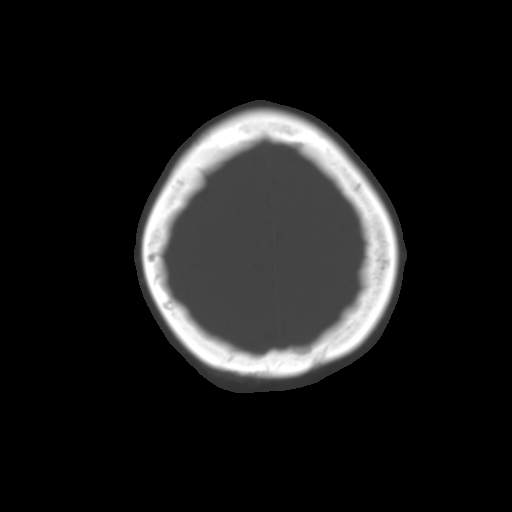
[im 23/28  brain]
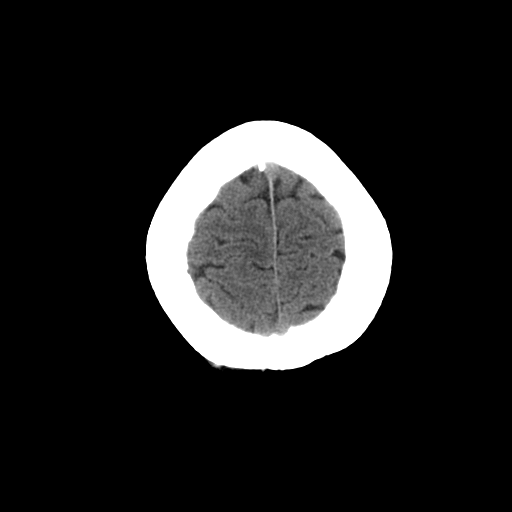
[im 25/28  brain]
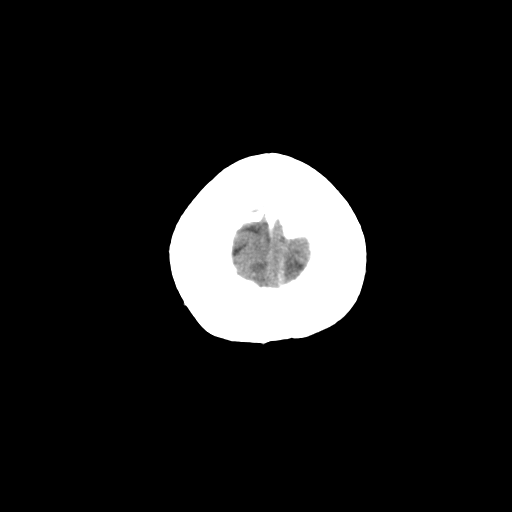
[im 27/28  brain]
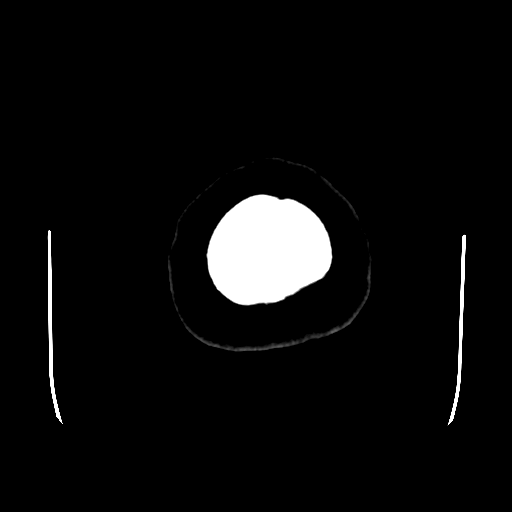

[16 of 28 positions shown; findings below may reference images not displayed]

FINDINGS: Normal appearing cerebral hemispheres and posterior fossa
structures.  Normal size and position of the ventricles.  No
intracranial hemorrhage, mass effect or visible infarction.
Unremarkable bones and included portions of the paranasal sinuses.
IMPRESSION: Normal examination.

## 2008-12-08 ENCOUNTER — Emergency Department (HOSPITAL_COMMUNITY): Admission: EM | Admit: 2008-12-08 | Discharge: 2008-12-08 | Payer: Self-pay | Admitting: Emergency Medicine

## 2009-01-28 ENCOUNTER — Emergency Department (HOSPITAL_COMMUNITY): Admission: EM | Admit: 2009-01-28 | Discharge: 2009-01-28 | Payer: Self-pay | Admitting: Emergency Medicine

## 2009-01-30 ENCOUNTER — Emergency Department (HOSPITAL_COMMUNITY): Admission: EM | Admit: 2009-01-30 | Discharge: 2009-01-30 | Payer: Self-pay | Admitting: Emergency Medicine

## 2009-03-01 IMAGING — CR DG ABDOMEN 2V
2 series · 2 of 2 positions shown · non-contrast
Comparison: Two views abdomen 09/29/2007

CLINICAL DATA: Abdominal pain and constipation

ABDOMEN - 2 VIEW

[w abdomen upright]
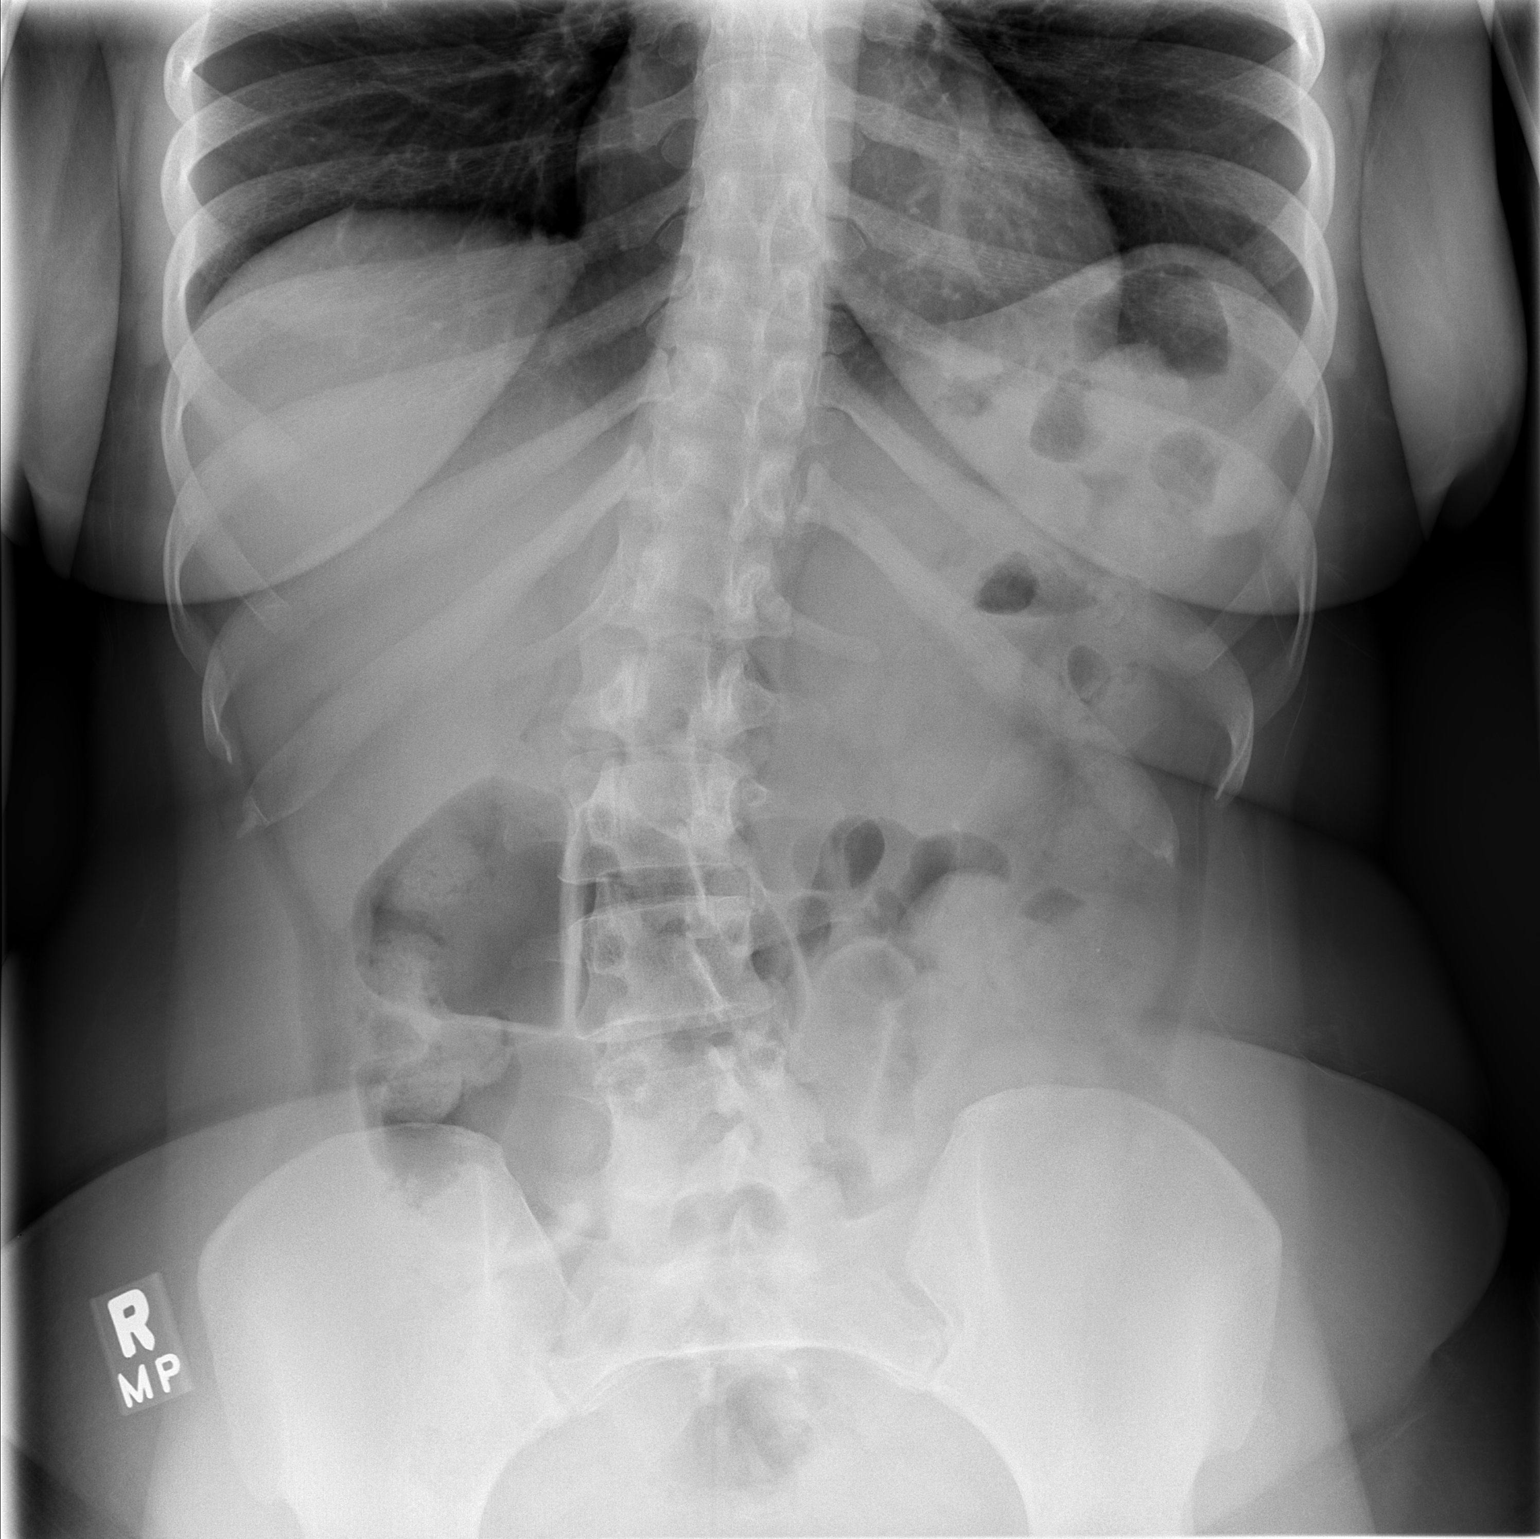

[t abdomen supine]
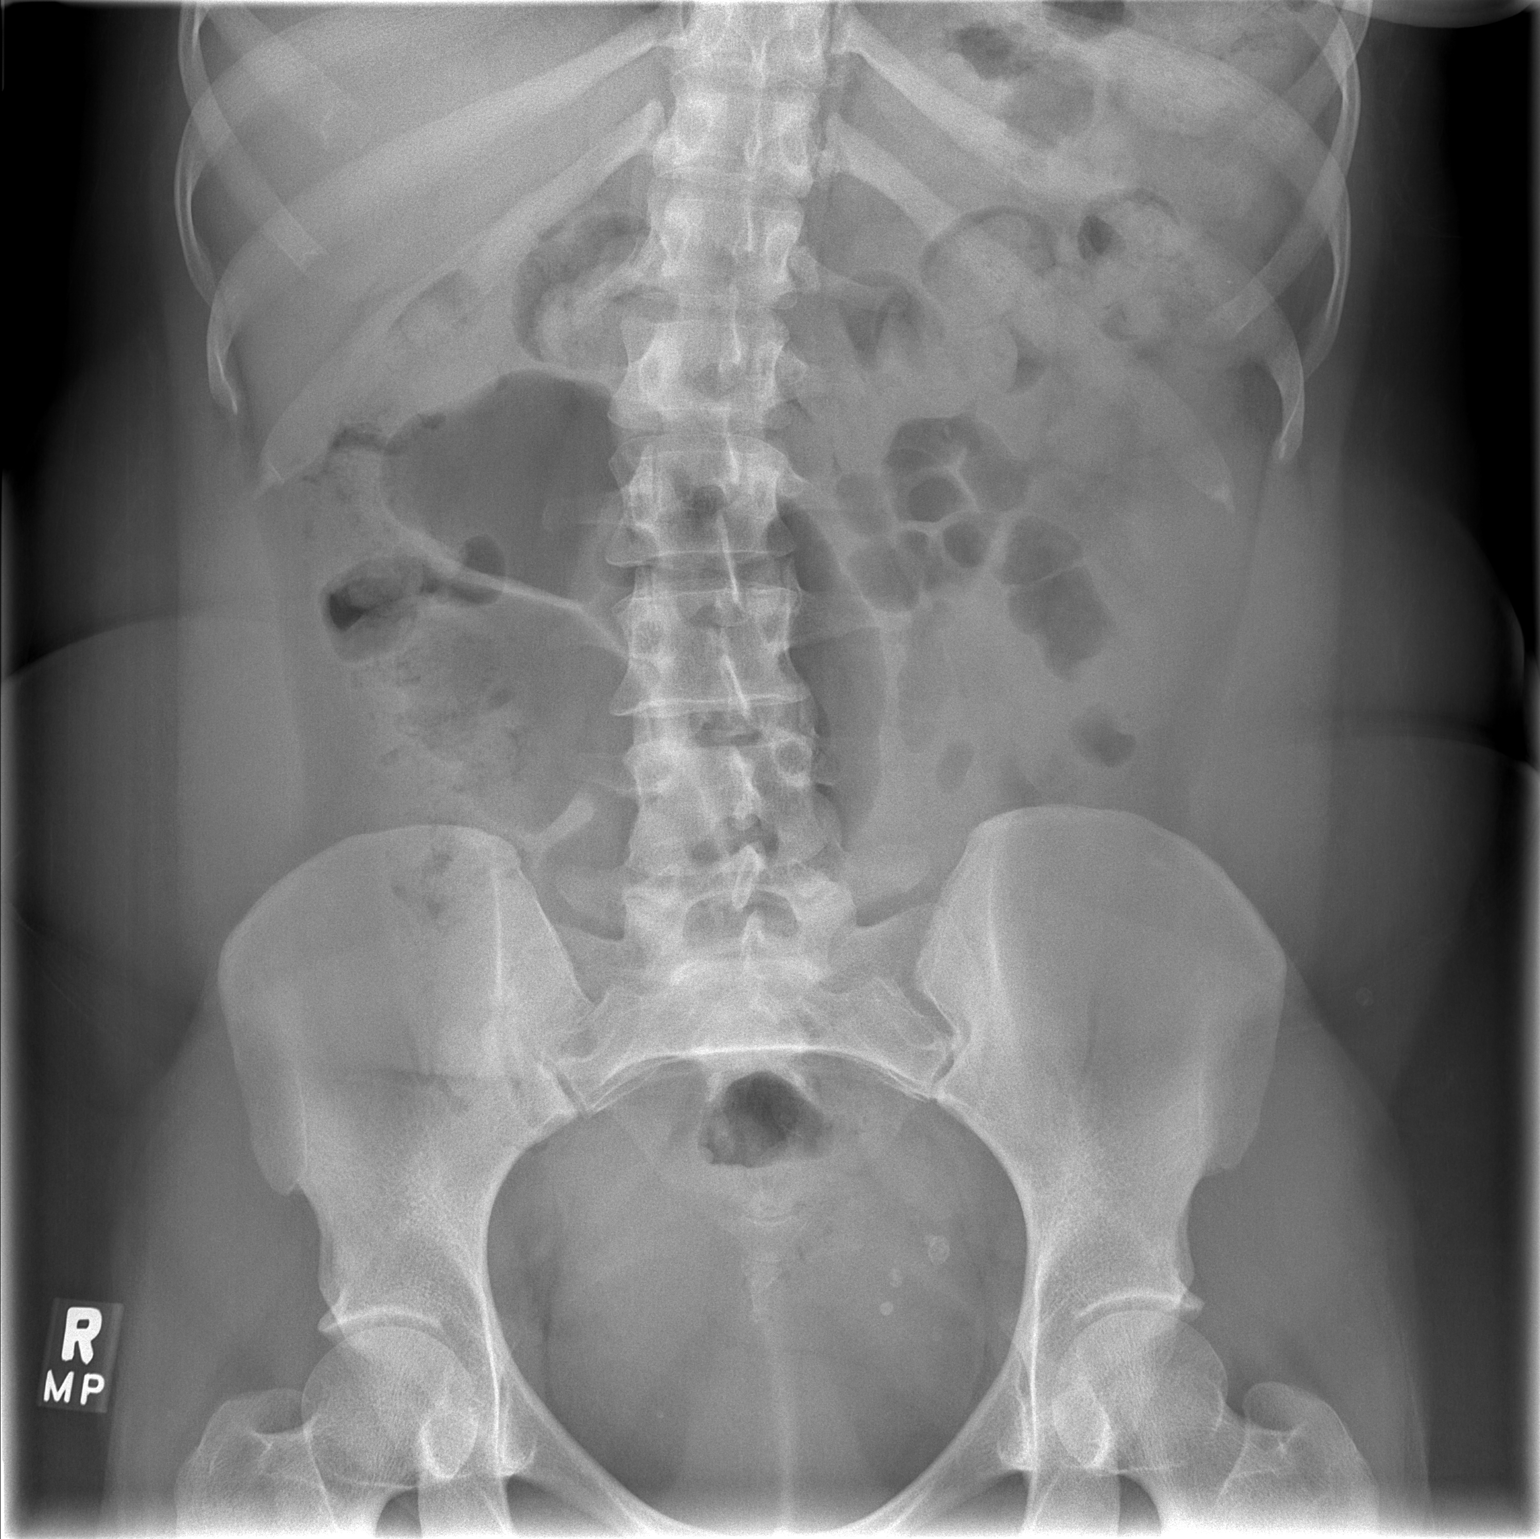

[2 of 2 positions shown; findings below may reference images not displayed]

FINDINGS: There is no free intraperitoneal air.  Gaseous distension
of the colon has improved.  There is a moderate stool burden.  No
evidence of small bowel obstruction.
IMPRESSION: Improved colonic ileus.

## 2009-03-04 ENCOUNTER — Emergency Department (HOSPITAL_COMMUNITY): Admission: EM | Admit: 2009-03-04 | Discharge: 2009-03-04 | Payer: Self-pay | Admitting: Emergency Medicine

## 2009-03-09 ENCOUNTER — Emergency Department (HOSPITAL_COMMUNITY): Admission: EM | Admit: 2009-03-09 | Discharge: 2009-03-09 | Payer: Self-pay | Admitting: Emergency Medicine

## 2009-03-25 ENCOUNTER — Emergency Department (HOSPITAL_COMMUNITY): Admission: EM | Admit: 2009-03-25 | Discharge: 2009-03-25 | Payer: Self-pay | Admitting: Emergency Medicine

## 2009-04-24 ENCOUNTER — Emergency Department (HOSPITAL_COMMUNITY): Admission: EM | Admit: 2009-04-24 | Discharge: 2009-04-24 | Payer: Self-pay | Admitting: Family Medicine

## 2009-05-20 ENCOUNTER — Emergency Department (HOSPITAL_COMMUNITY): Admission: EM | Admit: 2009-05-20 | Discharge: 2009-05-20 | Payer: Self-pay | Admitting: Emergency Medicine

## 2009-06-01 ENCOUNTER — Emergency Department (HOSPITAL_COMMUNITY): Admission: EM | Admit: 2009-06-01 | Discharge: 2009-06-01 | Payer: Self-pay | Admitting: Emergency Medicine

## 2009-06-08 ENCOUNTER — Emergency Department (HOSPITAL_COMMUNITY): Admission: EM | Admit: 2009-06-08 | Discharge: 2009-06-08 | Payer: Self-pay | Admitting: Emergency Medicine

## 2009-06-13 IMAGING — CR DG CHEST 2V
2 series · 2 of 2 positions shown · non-contrast
Comparison: 03/04/2008

CLINICAL DATA: Chest pain.  Vomiting.  Headache.

CHEST - 2 VIEW

[w chest pa]
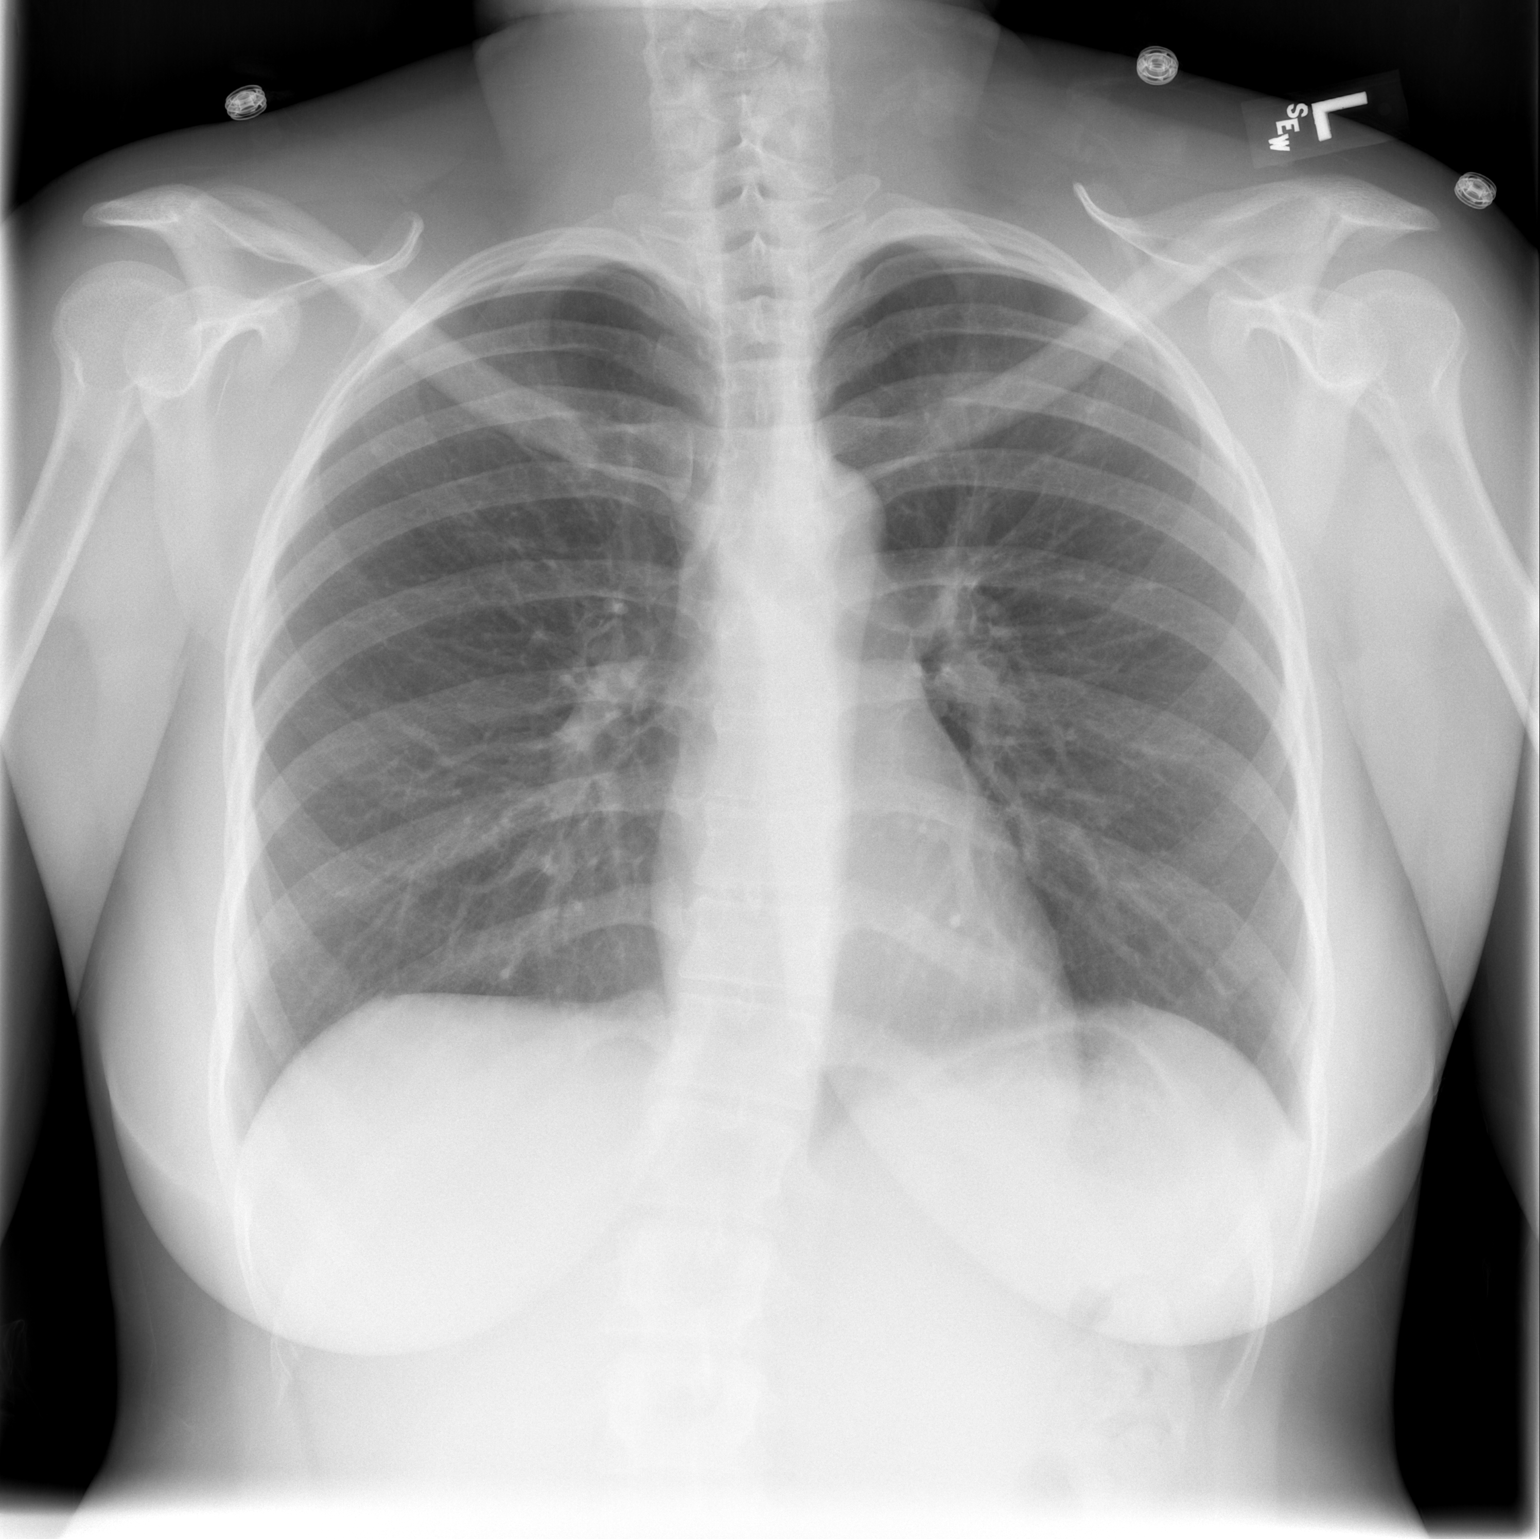

[w chest lat]
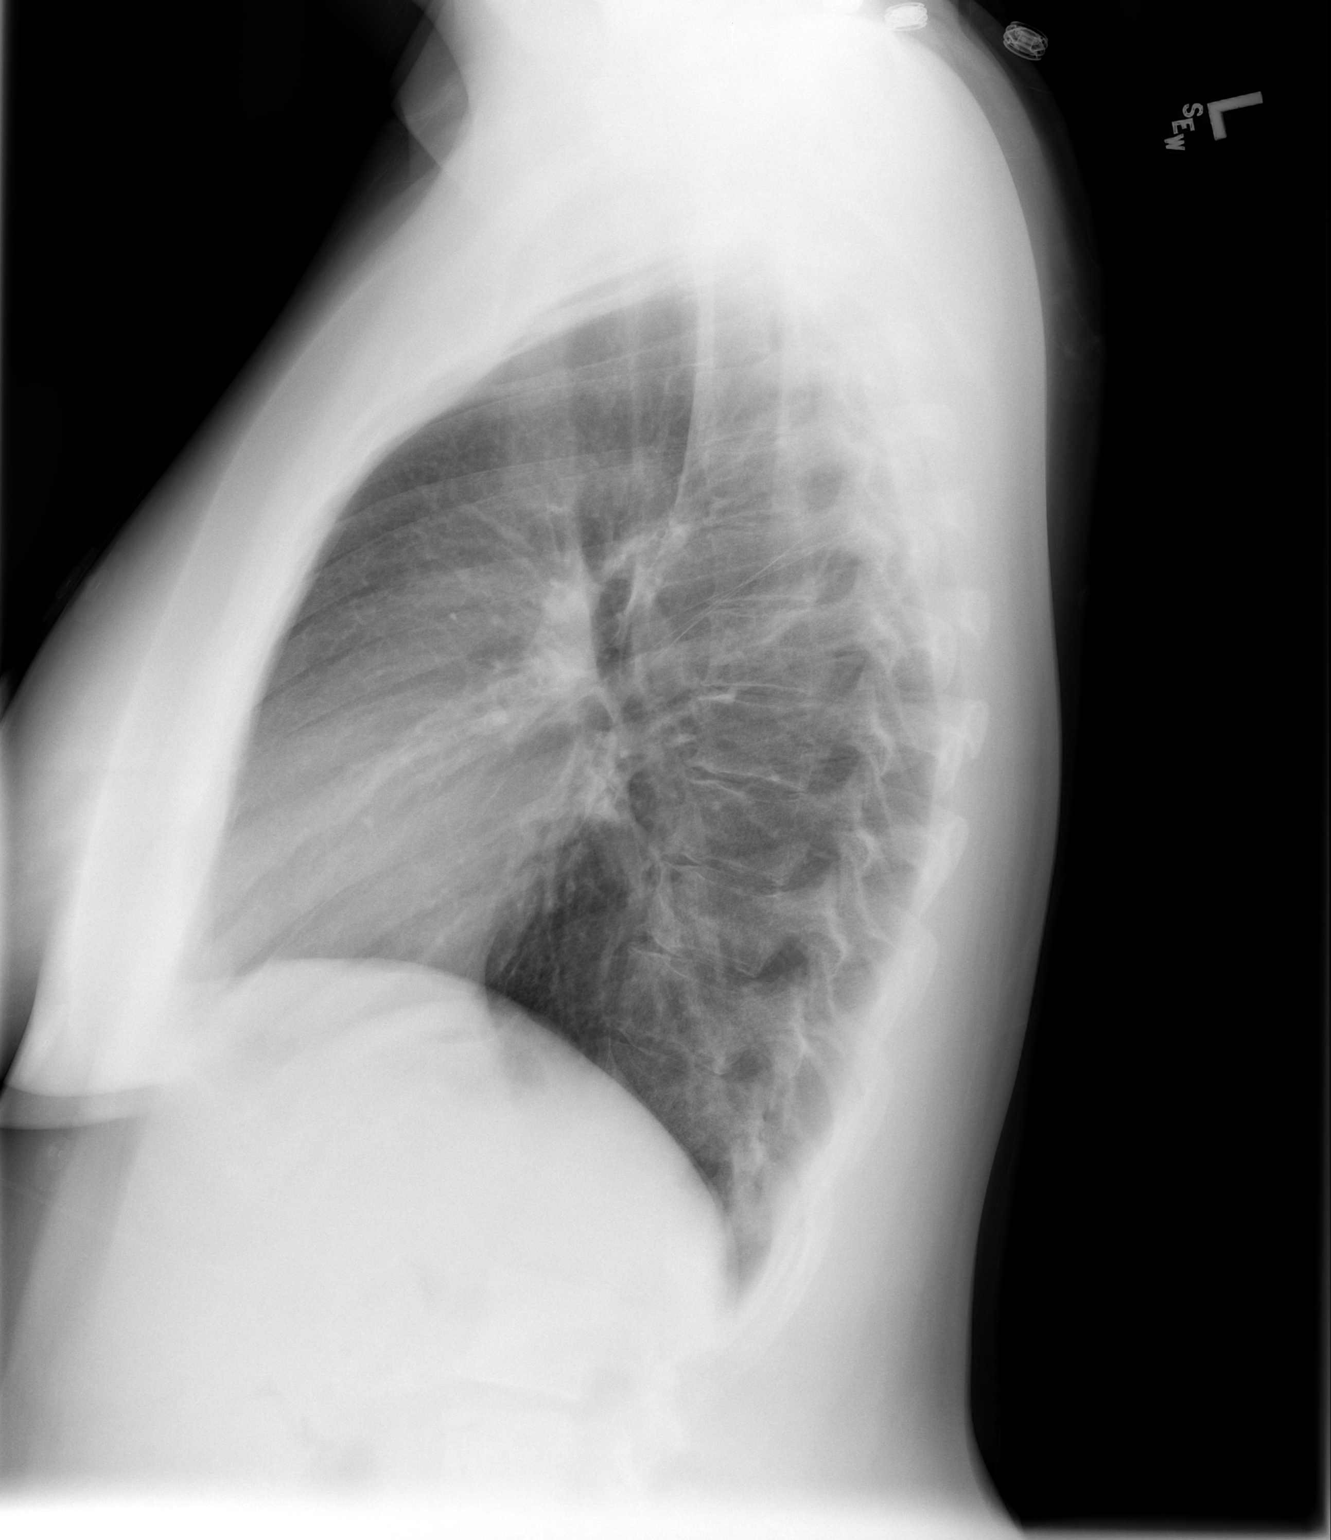

[2 of 2 positions shown; findings below may reference images not displayed]

FINDINGS: The heart size and mediastinal contours are within normal
limits.  No airspace disease or effusion.  4 mm probable calcified
granuloma identified over the left anterior second rib end.  This
did not appear present on prior examinations.  Follow-up 2-month PA
and lateral chest radiograph recommended to assess for stability.
The visualized skeletal structures are unremarkable.
IMPRESSION: No active cardiopulmonary disease. Probable 4 mm left upper lobe
calcified granuloma.  Follow-up chest radiograph in 2 months
recommended to assess for stability.

## 2009-06-14 ENCOUNTER — Emergency Department (HOSPITAL_COMMUNITY): Admission: EM | Admit: 2009-06-14 | Discharge: 2009-06-14 | Payer: Self-pay | Admitting: Emergency Medicine

## 2009-06-16 IMAGING — CR DG CHEST 2V
2 series · 2 of 2 positions shown · non-contrast
Comparison: PA and lateral chest 10/15/2008.

CLINICAL DATA: Chest pain shortness of breath.

CHEST - 2 VIEW

[w chest pa *]
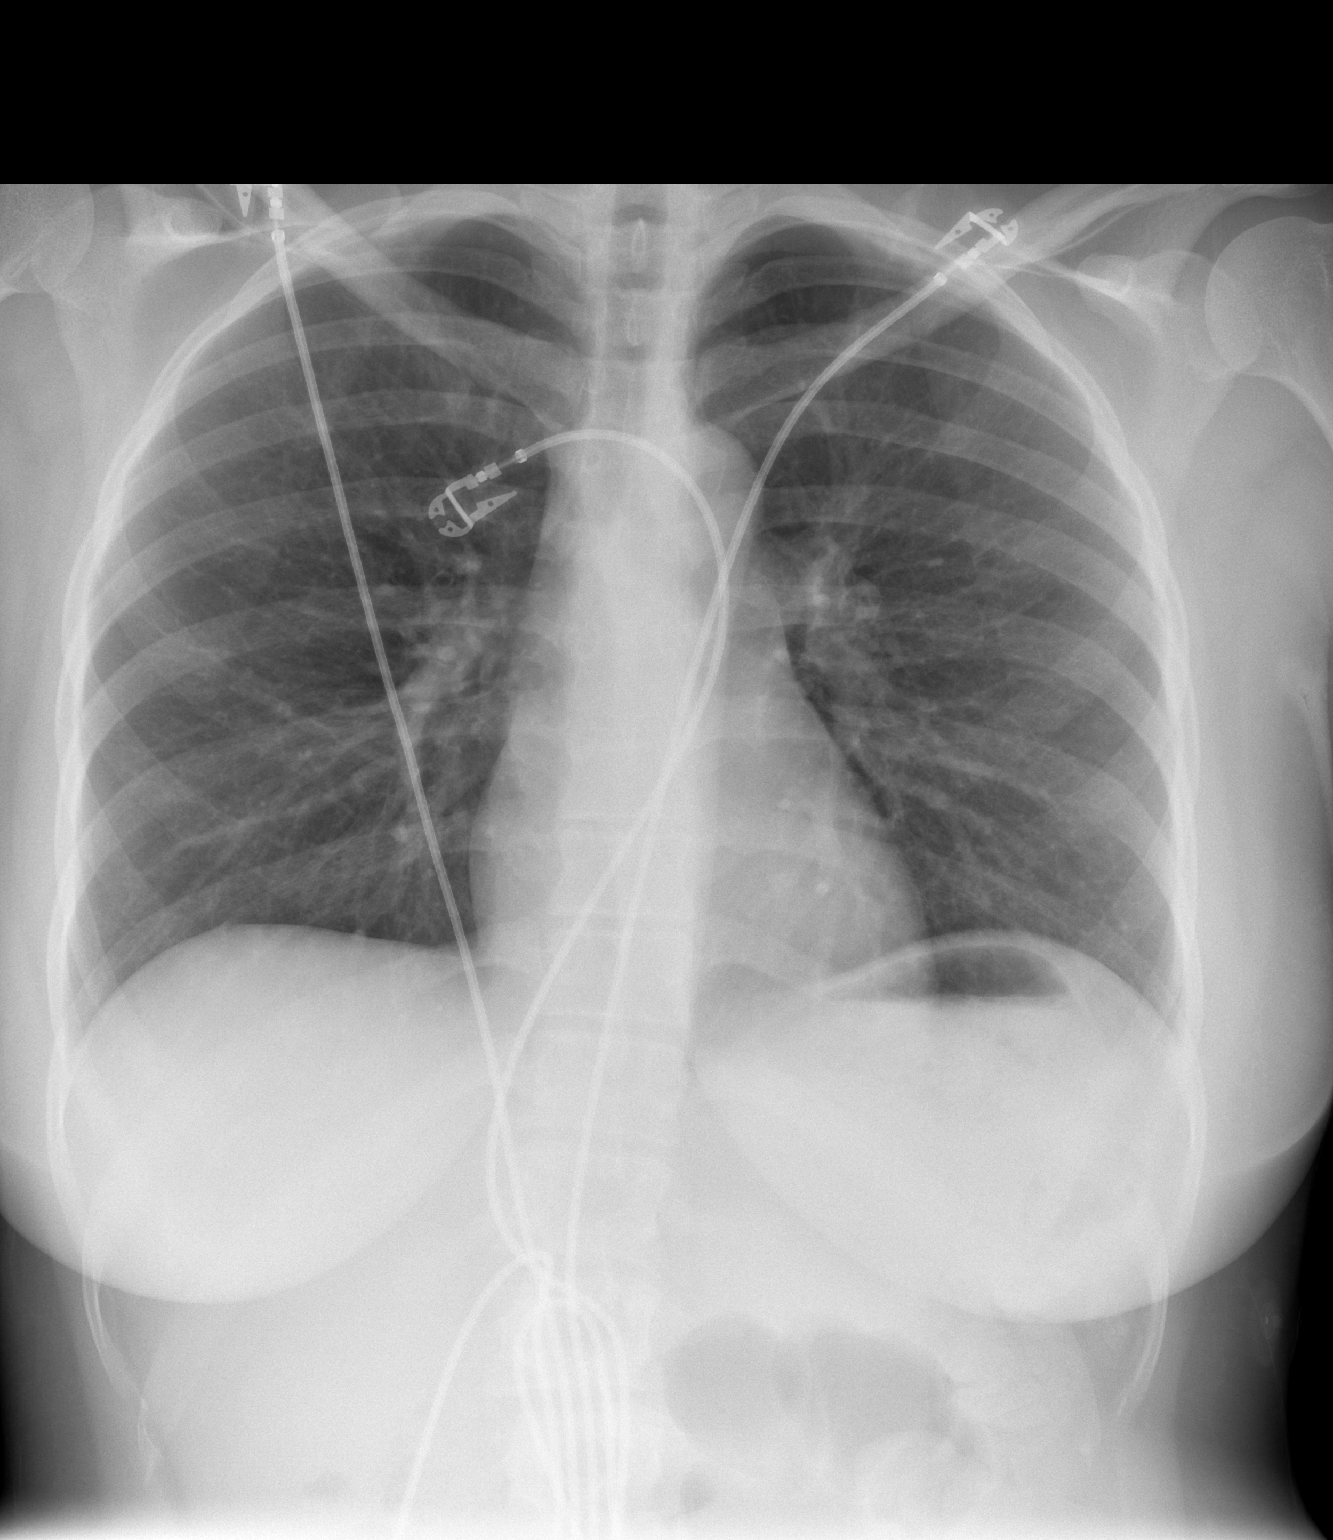

[w chest lat]
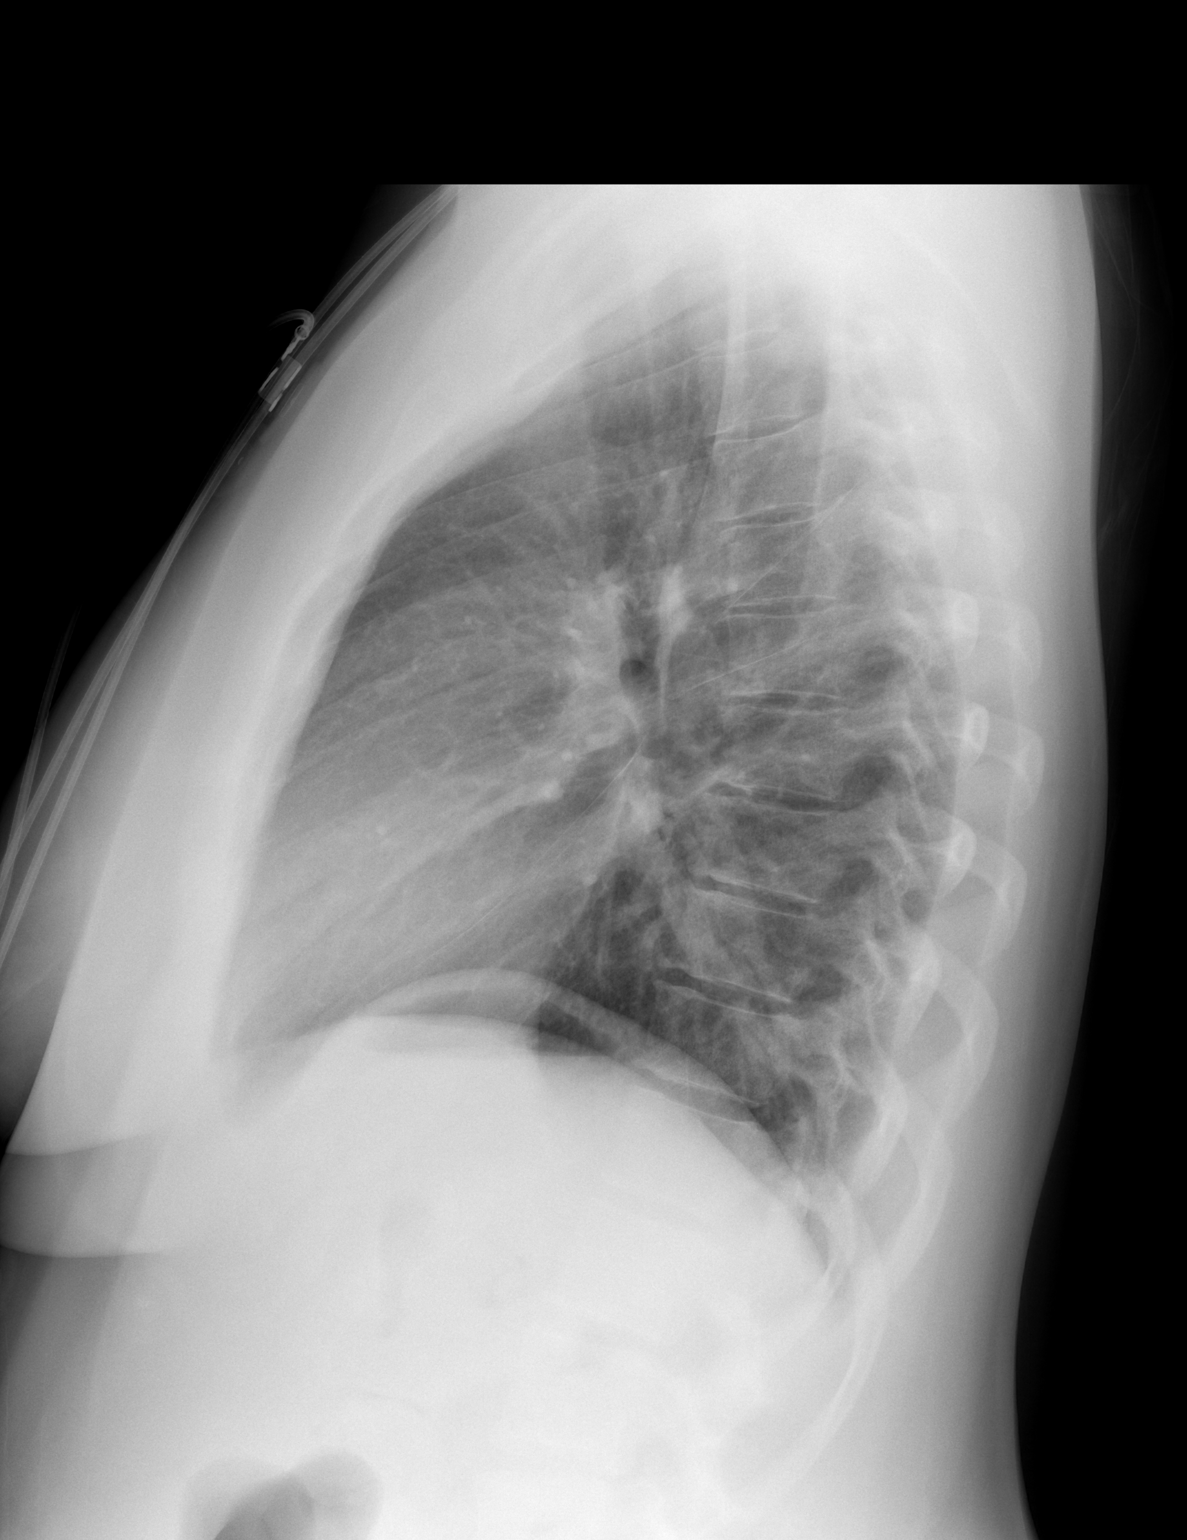

[2 of 2 positions shown; findings below may reference images not displayed]

FINDINGS: The lungs are clear.  There is no pleural effusion.
Heart size is normal.  Small calcified granuloma in the left upper
lobe seen on the prior study is not visible on this examination.
IMPRESSION: No acute disease.

## 2009-06-21 ENCOUNTER — Emergency Department (HOSPITAL_COMMUNITY): Admission: EM | Admit: 2009-06-21 | Discharge: 2009-06-22 | Payer: Self-pay | Admitting: Emergency Medicine

## 2009-06-24 ENCOUNTER — Emergency Department (HOSPITAL_COMMUNITY): Admission: EM | Admit: 2009-06-24 | Discharge: 2009-06-24 | Payer: Self-pay | Admitting: Emergency Medicine

## 2009-08-06 IMAGING — CR DG CHEST 2V
2 series · 2 of 2 positions shown · non-contrast
Comparison: 10/18/2008

CLINICAL DATA: Chest pain

CHEST - 2 VIEW

[w chest pa]
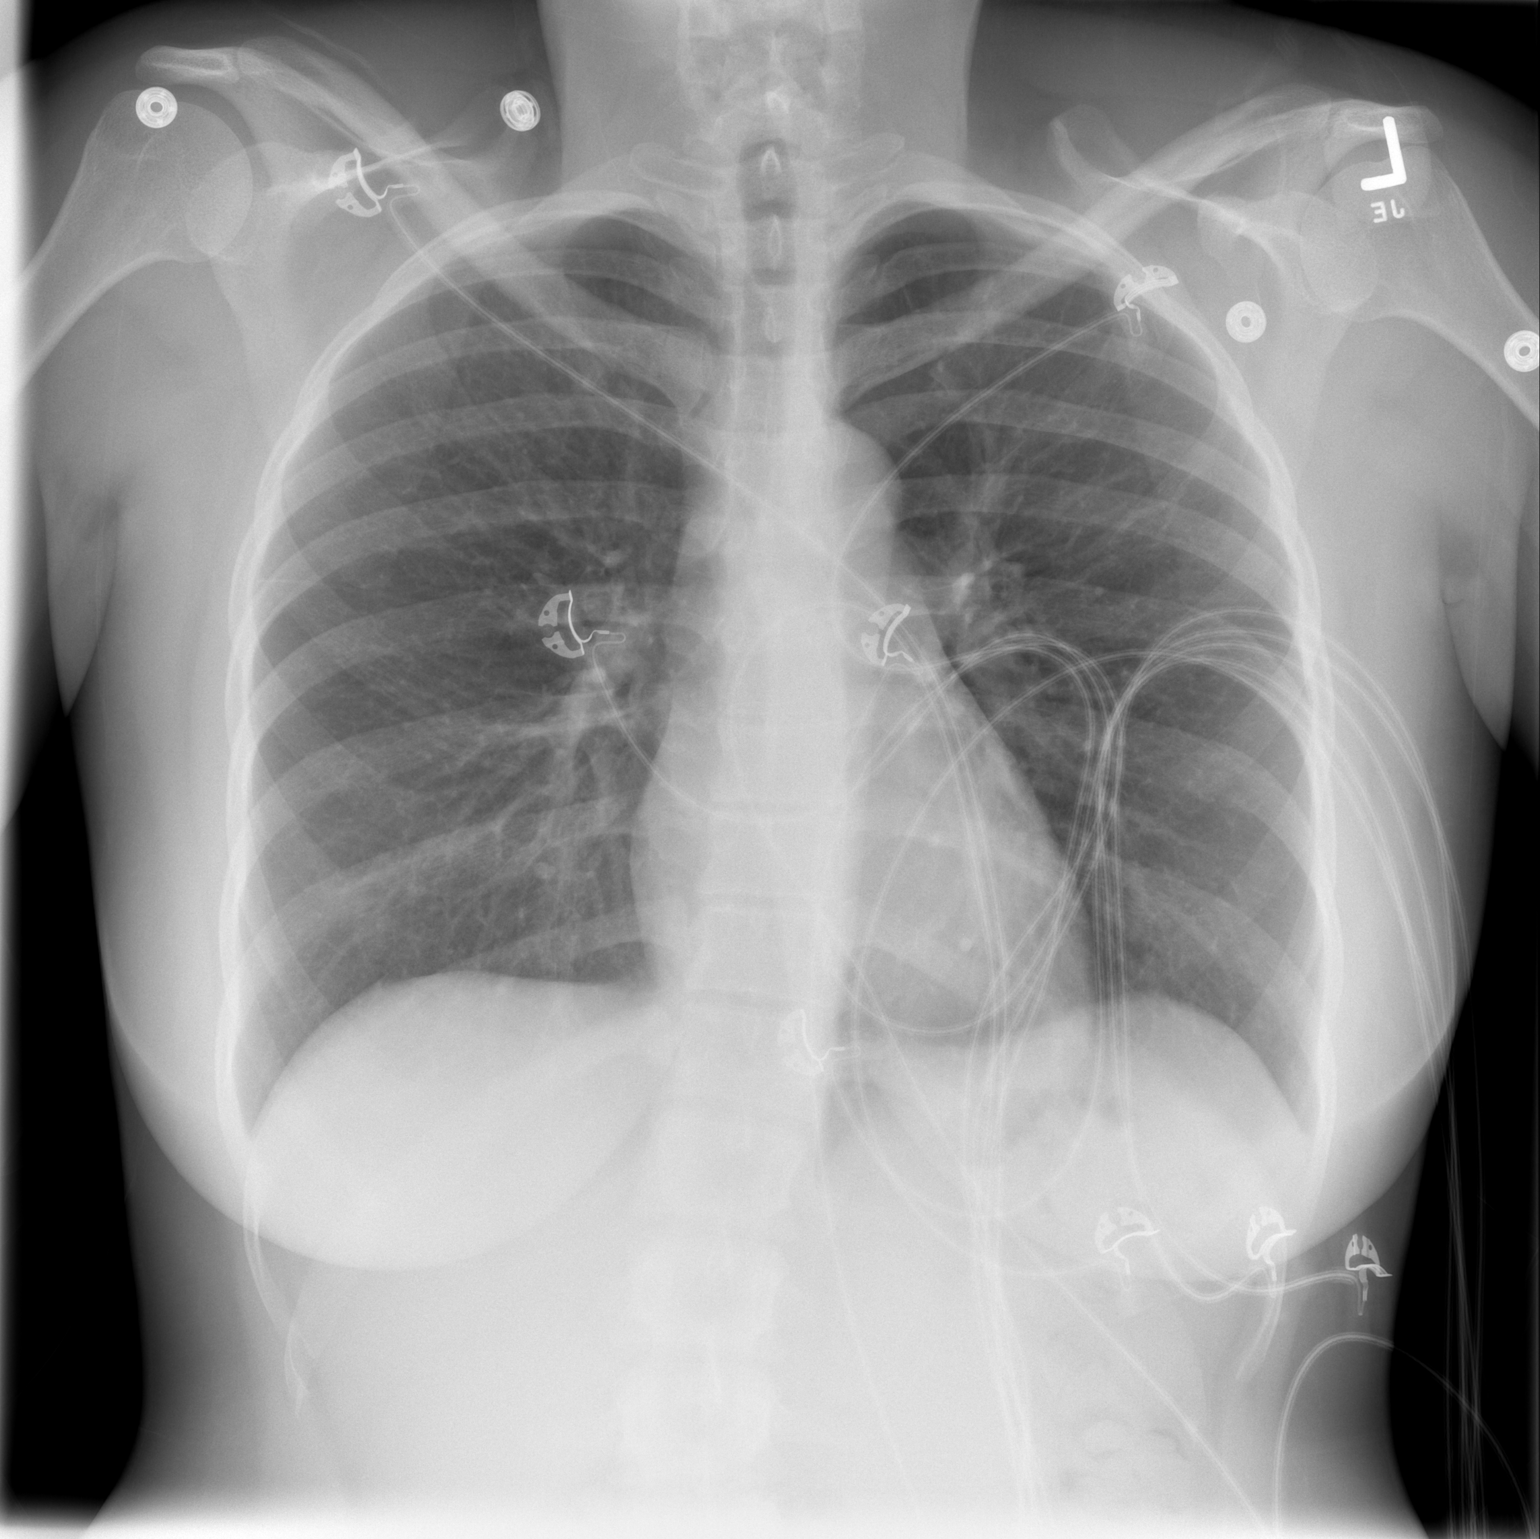

[w chest lat]
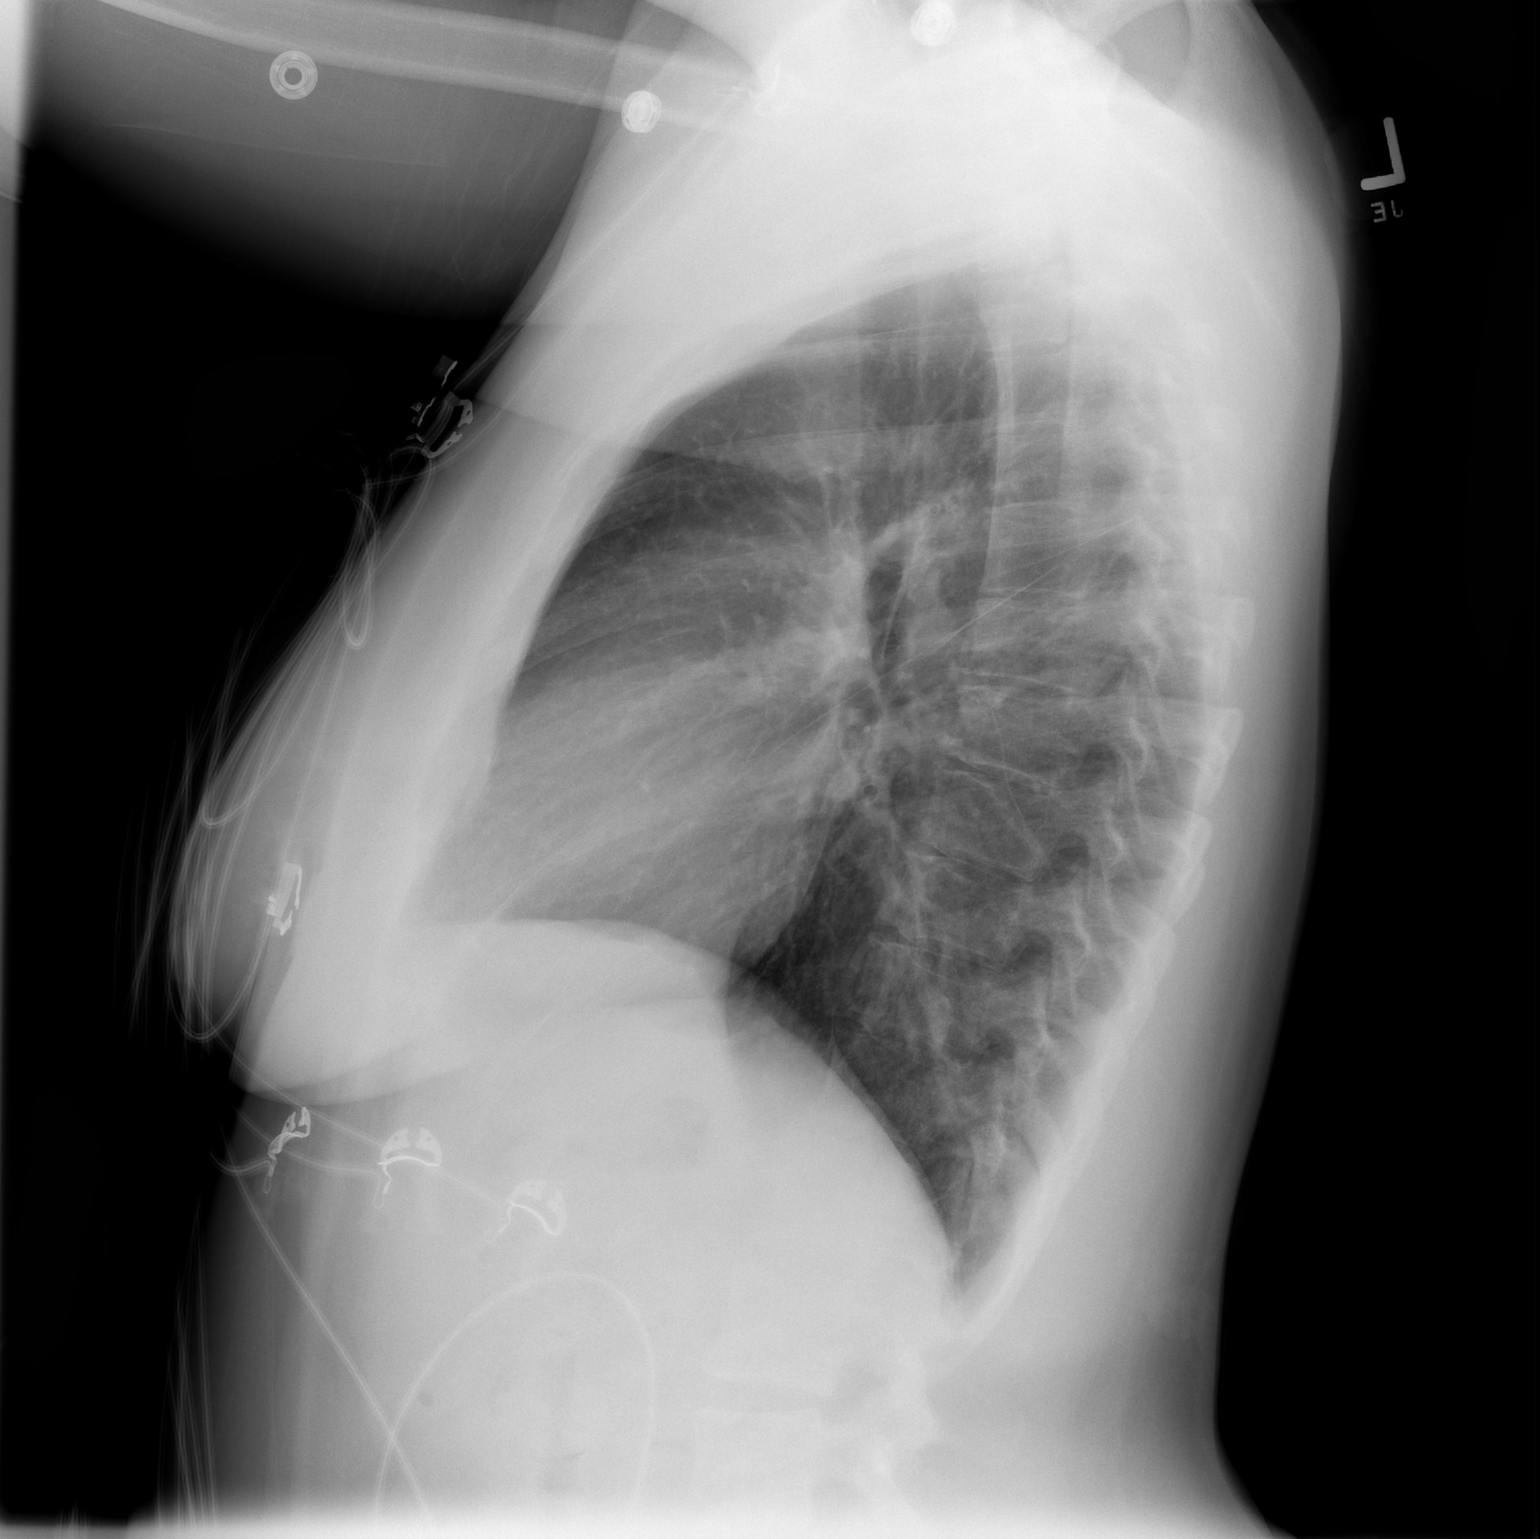

[2 of 2 positions shown; findings below may reference images not displayed]

FINDINGS: The heart is normal in size.  Lungs are clear.  No
pneumothorax.
IMPRESSION: No active cardiopulmonary disease.

## 2009-08-24 ENCOUNTER — Emergency Department (HOSPITAL_COMMUNITY): Admission: EM | Admit: 2009-08-24 | Discharge: 2009-08-24 | Payer: Self-pay | Admitting: Emergency Medicine

## 2009-09-19 ENCOUNTER — Emergency Department (HOSPITAL_COMMUNITY): Admission: EM | Admit: 2009-09-19 | Discharge: 2009-09-19 | Payer: Self-pay | Admitting: Emergency Medicine

## 2009-09-24 ENCOUNTER — Emergency Department (HOSPITAL_COMMUNITY): Admission: EM | Admit: 2009-09-24 | Discharge: 2009-09-24 | Payer: Self-pay | Admitting: Emergency Medicine

## 2009-09-26 ENCOUNTER — Emergency Department (HOSPITAL_COMMUNITY): Admission: EM | Admit: 2009-09-26 | Discharge: 2009-09-26 | Payer: Self-pay | Admitting: Emergency Medicine

## 2009-10-11 ENCOUNTER — Emergency Department (HOSPITAL_COMMUNITY): Admission: EM | Admit: 2009-10-11 | Discharge: 2009-10-11 | Payer: Self-pay | Admitting: Emergency Medicine

## 2009-10-31 IMAGING — CR DG CHEST 2V
2 series · 2 of 2 positions shown · non-contrast
Comparison: Chest x-ray of 12/08/2008

CLINICAL DATA: Chest pain, cough, former smoker

CHEST - 2 VIEW

[w chest pa]
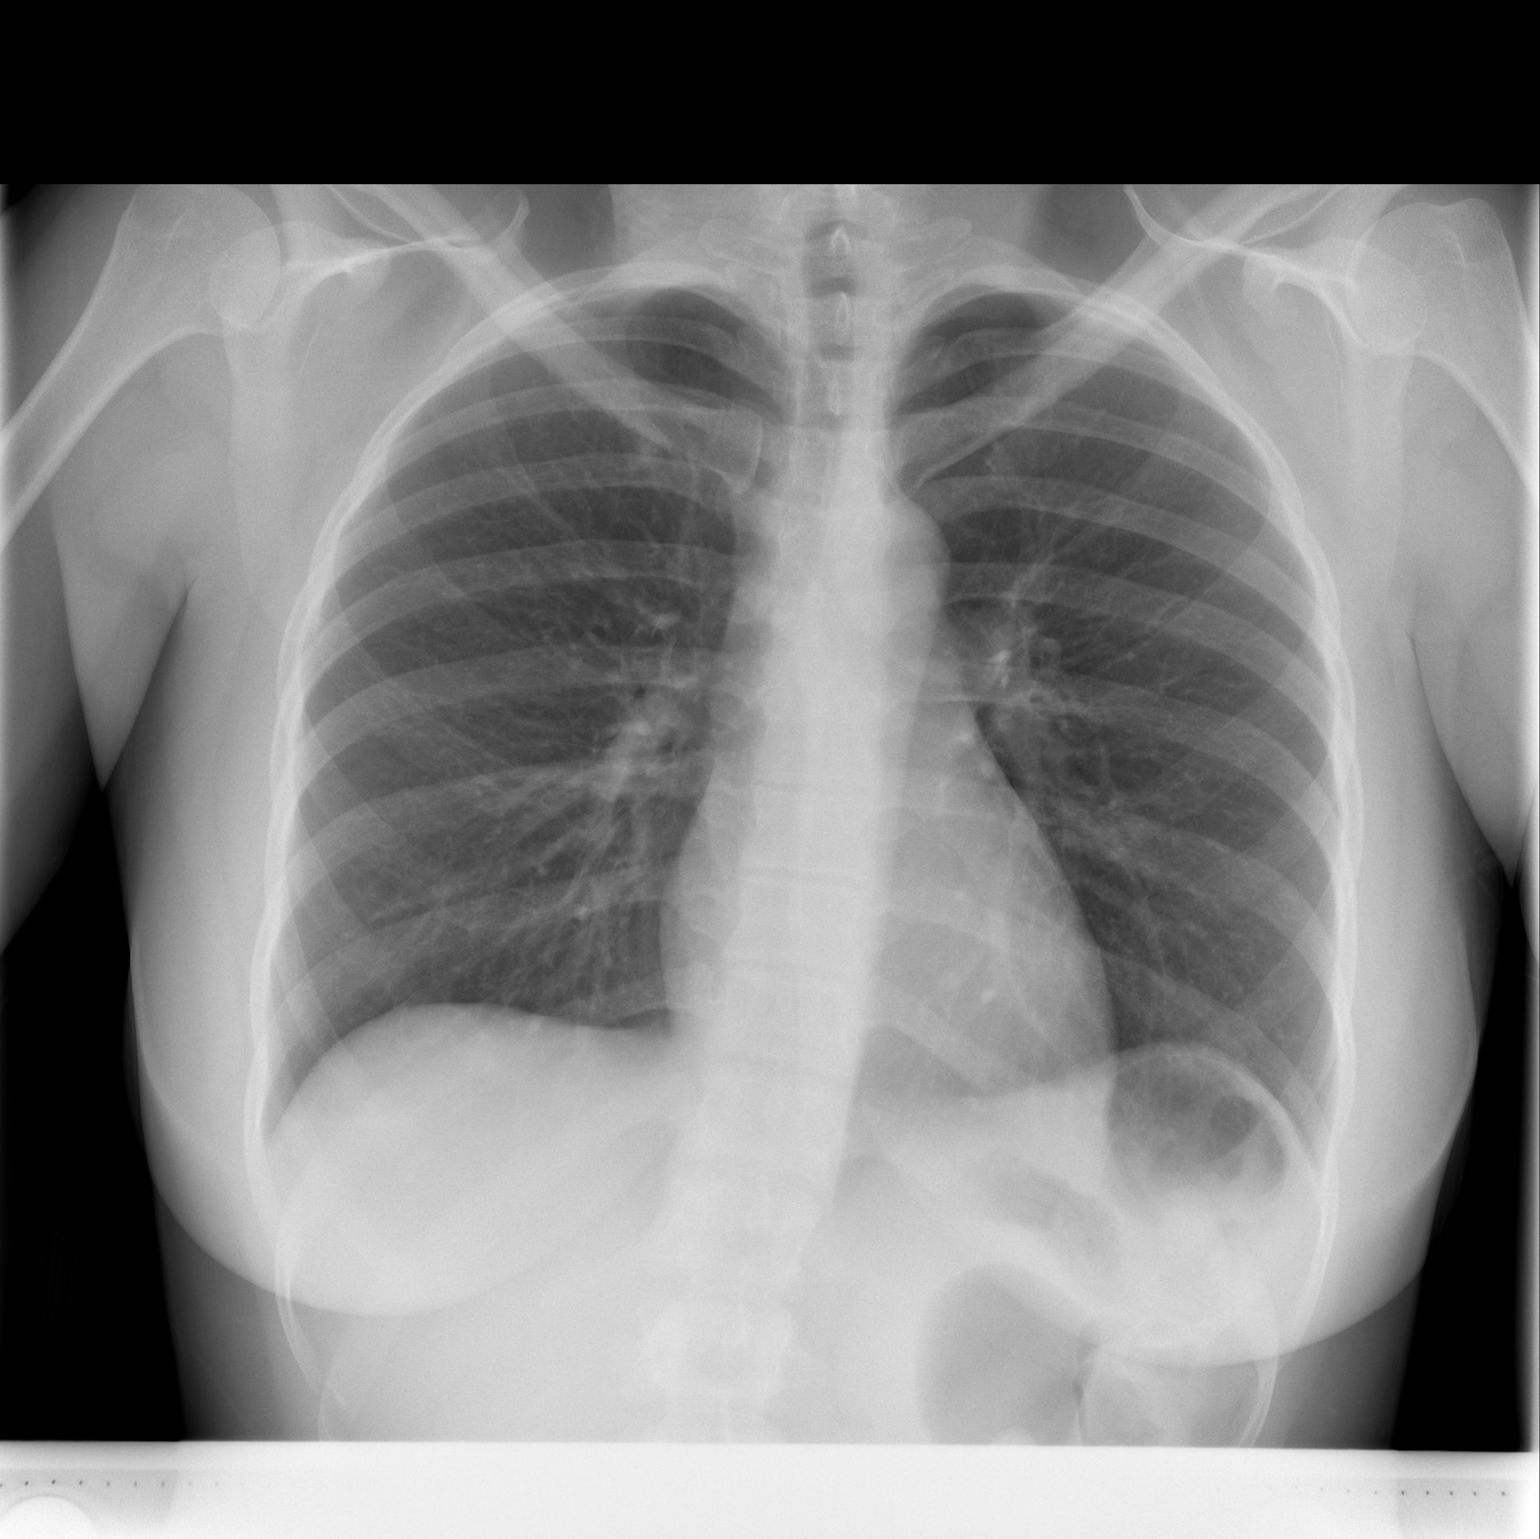

[w chest lat]
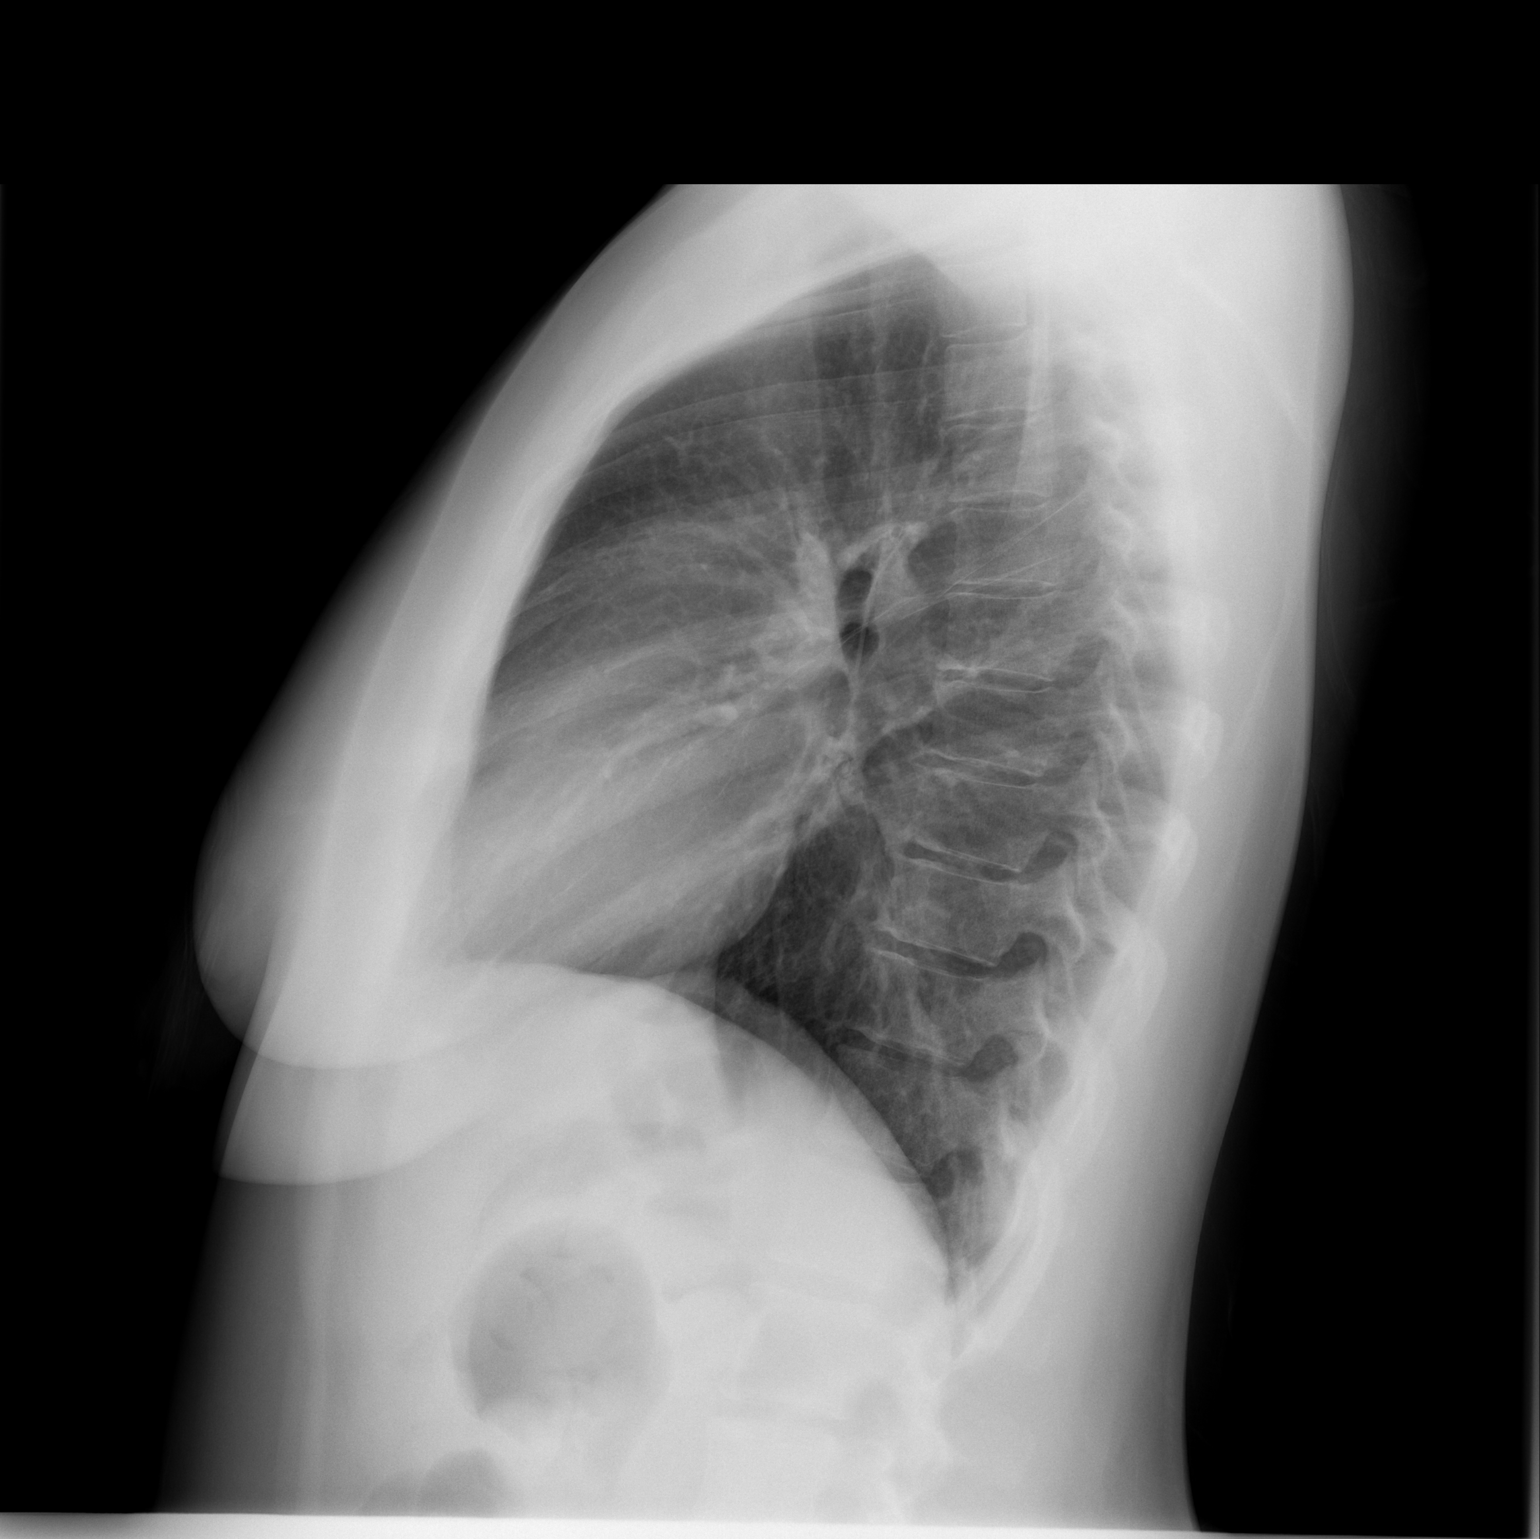

[2 of 2 positions shown; findings below may reference images not displayed]

FINDINGS: The lungs are clear. Mild peribronchial thickening is
noted. The heart is within normal limits in size. No bony
abnormality is seen.
IMPRESSION: No active lung disease. Mild peribronchial thickening.

## 2009-11-05 IMAGING — CR DG CHEST 2V
2 series · 2 of 2 positions shown · non-contrast
Comparison: 03/04/2009

CLINICAL DATA: Chest pain.  History of asthma

CHEST - 2 VIEW

[w chest pa]
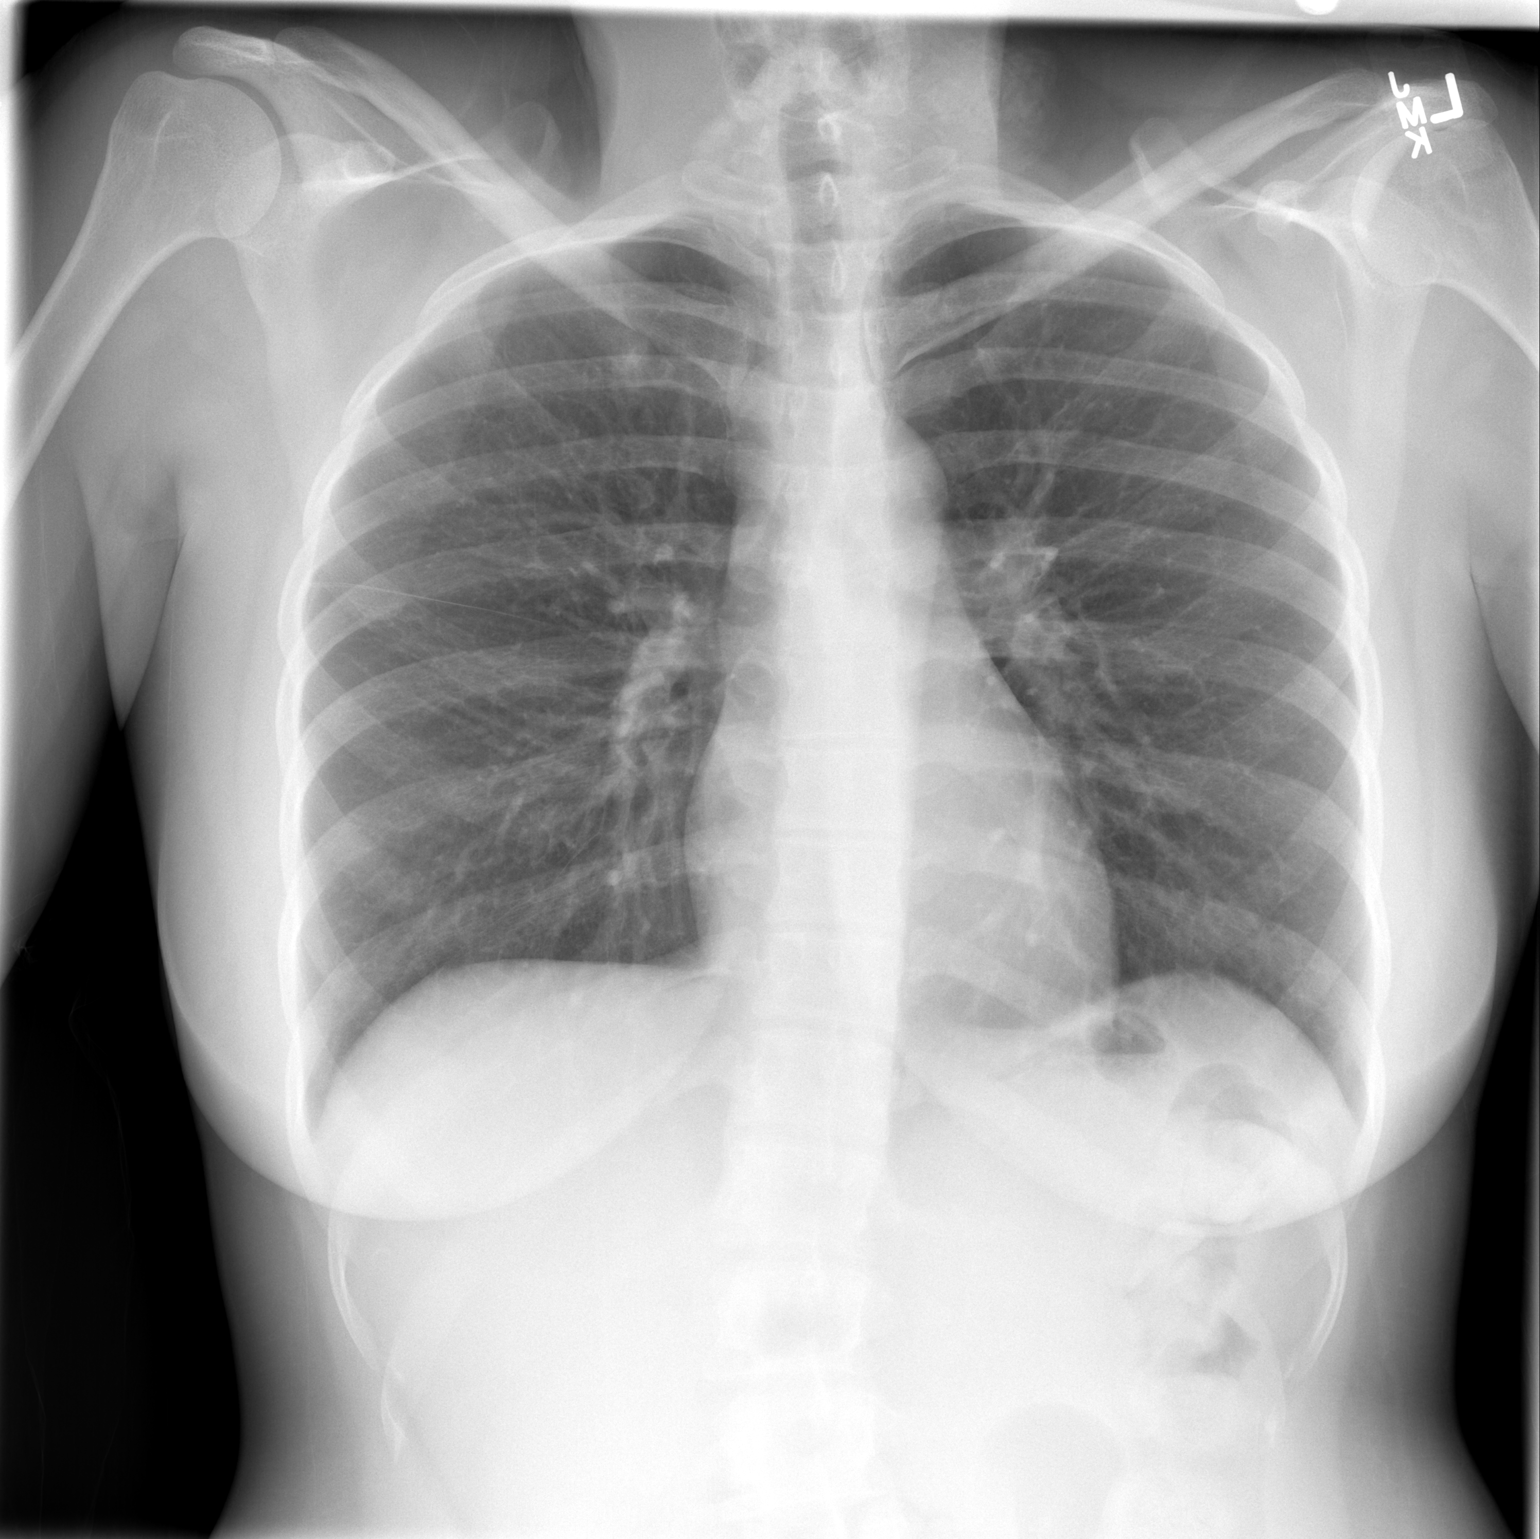

[w chest lat]
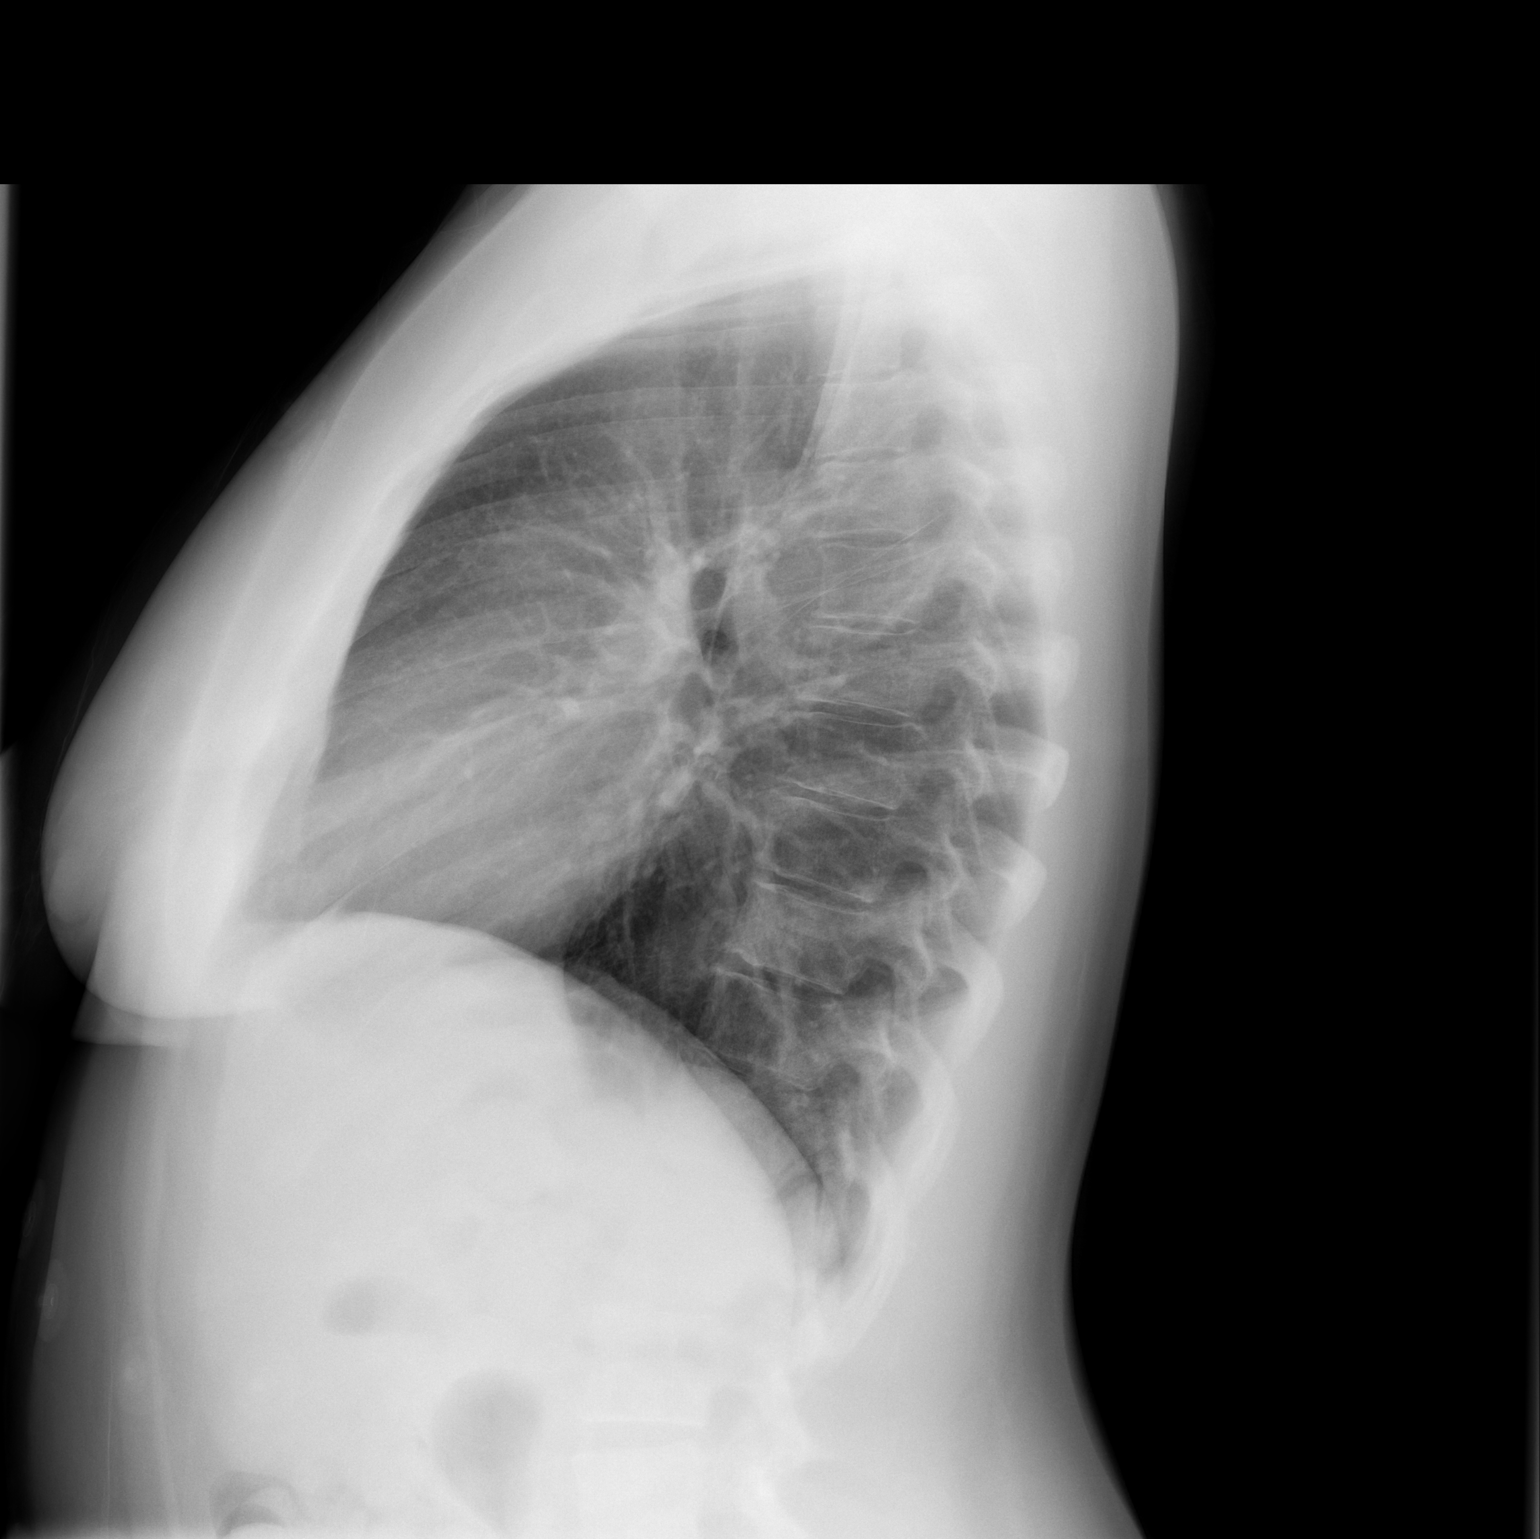

[2 of 2 positions shown; findings below may reference images not displayed]

FINDINGS: The lungs are hyperaerated but clear of an active
process.  Normal cardiomediastinal silhouette.  Minimal
peribronchial thickening, chronic.
IMPRESSION: Pulmonary hyperaeration.  Findings consistent with asthma.

## 2009-11-09 ENCOUNTER — Emergency Department (HOSPITAL_COMMUNITY): Admission: EM | Admit: 2009-11-09 | Discharge: 2009-11-09 | Payer: Self-pay | Admitting: Emergency Medicine

## 2009-11-14 ENCOUNTER — Emergency Department (HOSPITAL_COMMUNITY): Admission: EM | Admit: 2009-11-14 | Discharge: 2009-11-15 | Payer: Self-pay | Admitting: Emergency Medicine

## 2009-11-15 ENCOUNTER — Emergency Department (HOSPITAL_COMMUNITY): Admission: EM | Admit: 2009-11-15 | Discharge: 2009-11-15 | Payer: Self-pay | Admitting: Emergency Medicine

## 2009-11-24 ENCOUNTER — Emergency Department (HOSPITAL_COMMUNITY): Admission: EM | Admit: 2009-11-24 | Discharge: 2009-11-24 | Payer: Self-pay | Admitting: Emergency Medicine

## 2009-12-11 DIAGNOSIS — IMO0001 Reserved for inherently not codable concepts without codable children: Secondary | ICD-10-CM

## 2009-12-11 DIAGNOSIS — Z5189 Encounter for other specified aftercare: Secondary | ICD-10-CM

## 2009-12-11 HISTORY — DX: Reserved for inherently not codable concepts without codable children: IMO0001

## 2009-12-11 HISTORY — DX: Encounter for other specified aftercare: Z51.89

## 2009-12-17 ENCOUNTER — Inpatient Hospital Stay (HOSPITAL_COMMUNITY): Admission: EM | Admit: 2009-12-17 | Discharge: 2009-12-22 | Payer: Self-pay | Admitting: Emergency Medicine

## 2009-12-21 ENCOUNTER — Encounter (INDEPENDENT_AMBULATORY_CARE_PROVIDER_SITE_OTHER): Payer: Self-pay | Admitting: Internal Medicine

## 2010-02-18 IMAGING — CT CT MAXILLOFACIAL W/ CM
3 of 4 series · 16 of 30 positions shown, 19 images · IV contrast (80 ml omni 300)
Comparison: None

CLINICAL DATA: Right-sided facial pain and swelling.  Toothache.
Evaluate for facial abscess.

CT MAXILLOFACIAL WITH CONTRAST
TECHNIQUE: Multidetector CT imaging of the maxillofacial
structures was performed with intravenous contrast. Multiplanar CT
image reconstructions were also generated.
Contrast: 80 ml [id number]

[Series 4: recon 3: supine facial bones · axial · 0.36mm/px · z∈[-246,-97]mm · 9 of 149 slices shown, 12 images]
[im 15/149  brain]
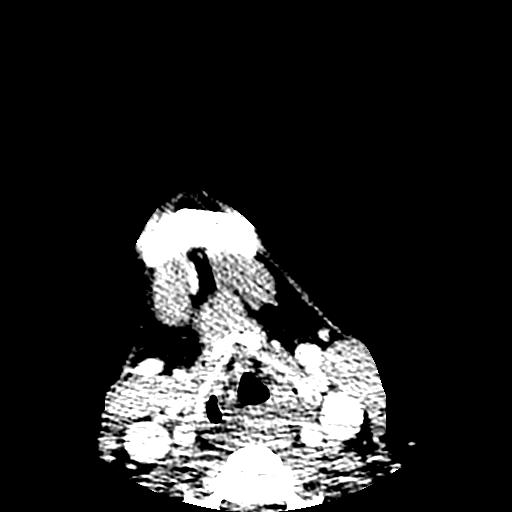
[im 15/149  bone]
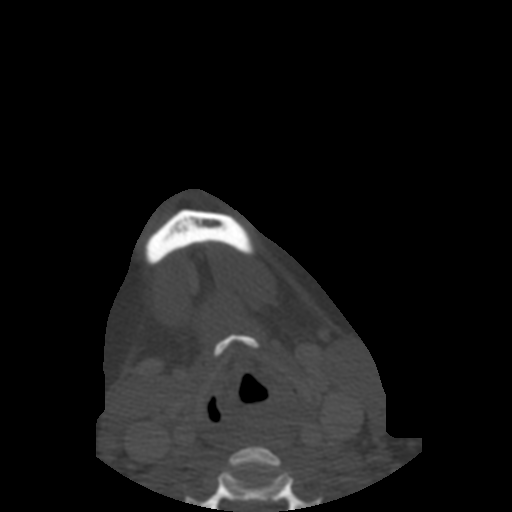
[im 30/149  bone]
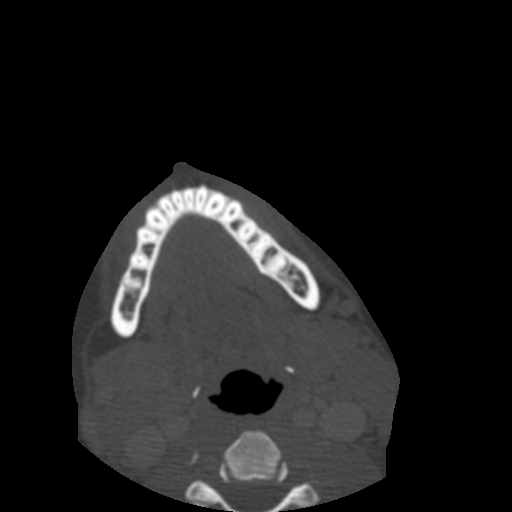
[im 45/149  bone]
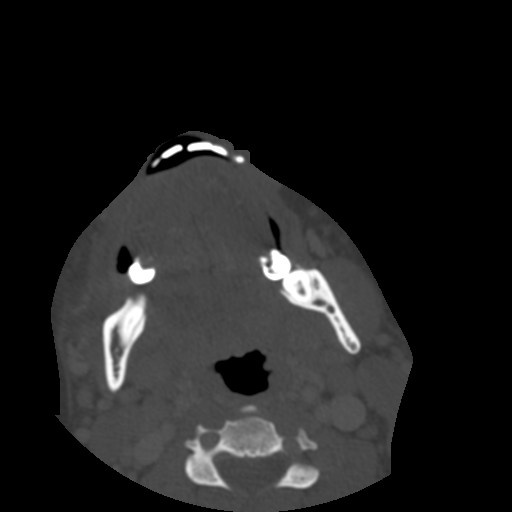
[im 60/149  bone]
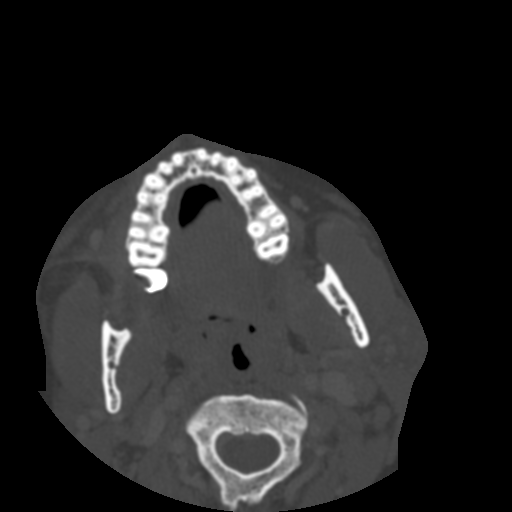
[im 75/149  brain]
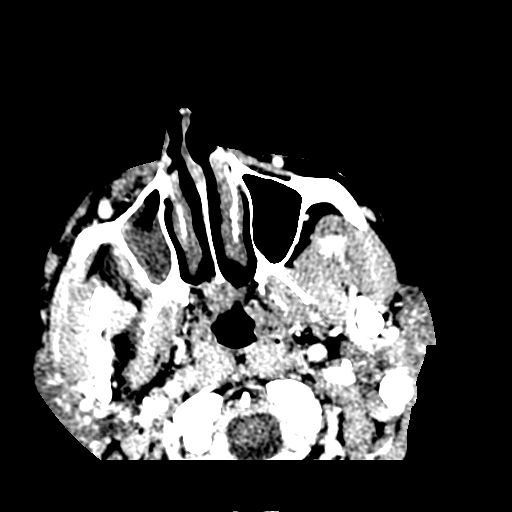
[im 75/149  bone]
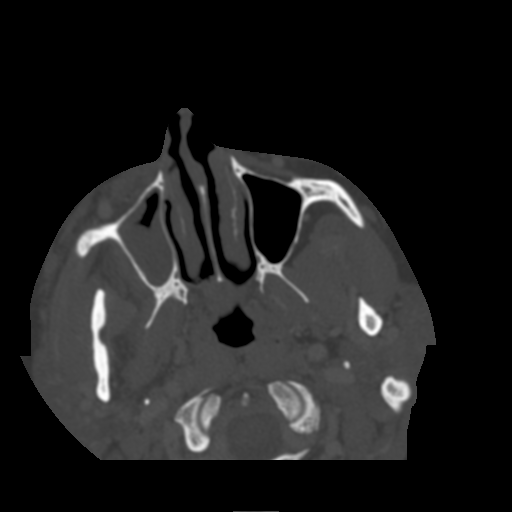
[im 89/149  bone]
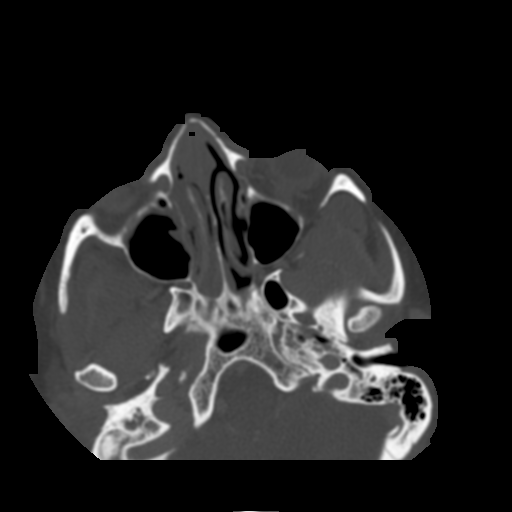
[im 104/149  bone]
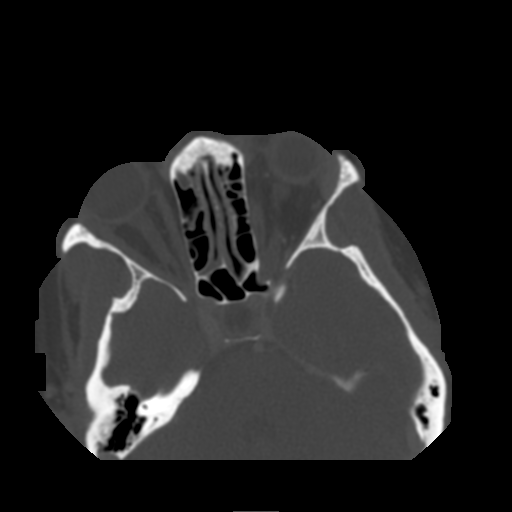
[im 119/149  bone]
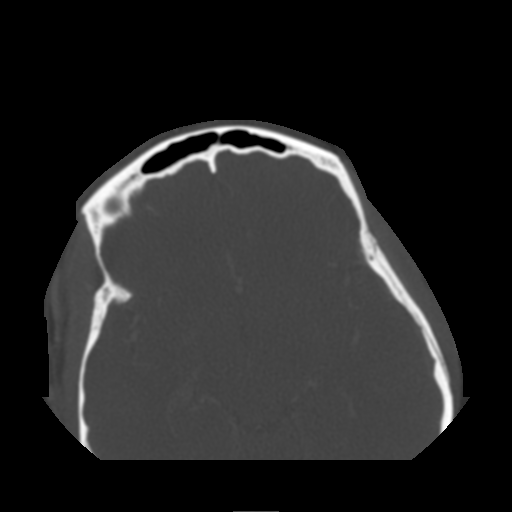
[im 134/149  brain]
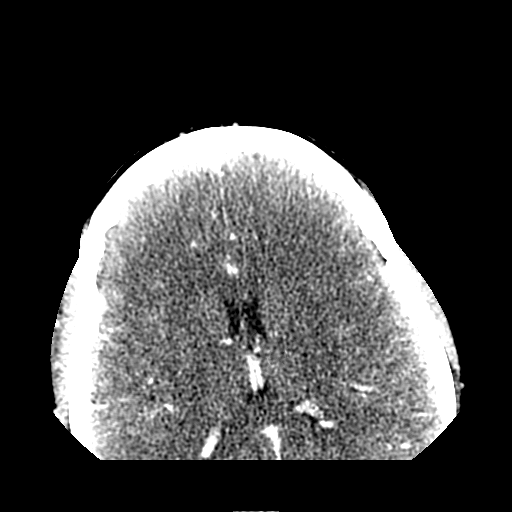
[im 134/149  bone]
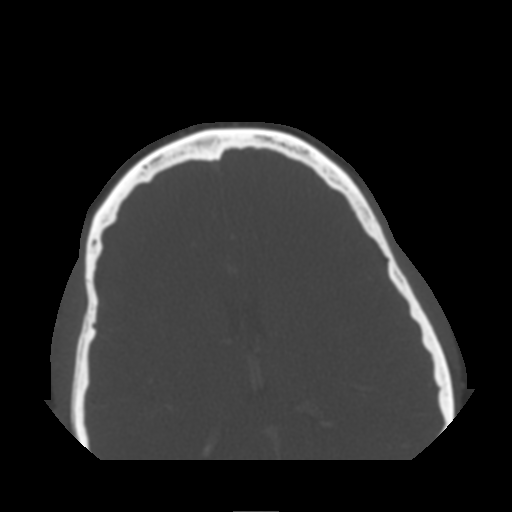

[Series 400: reformatted · sagittal · 0.37mm/px · 5 of 86 slices shown (1 of 2)]
[im 15/86  bone]
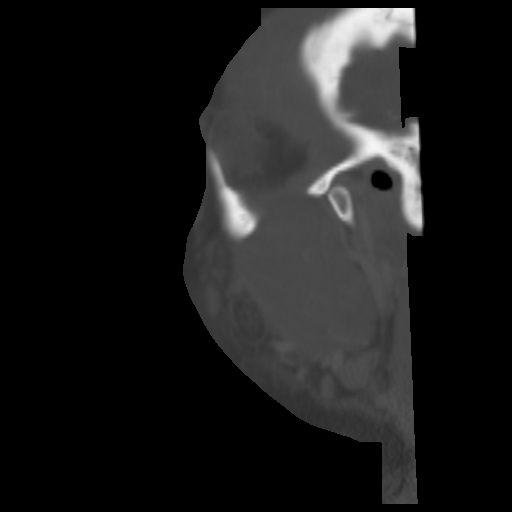
[im 29/86  bone]
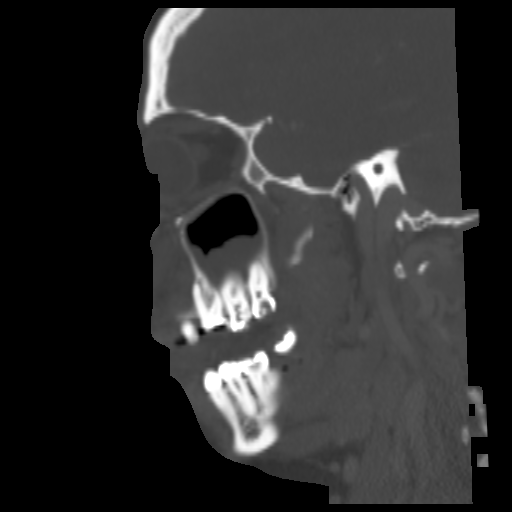
[im 43/86  bone]
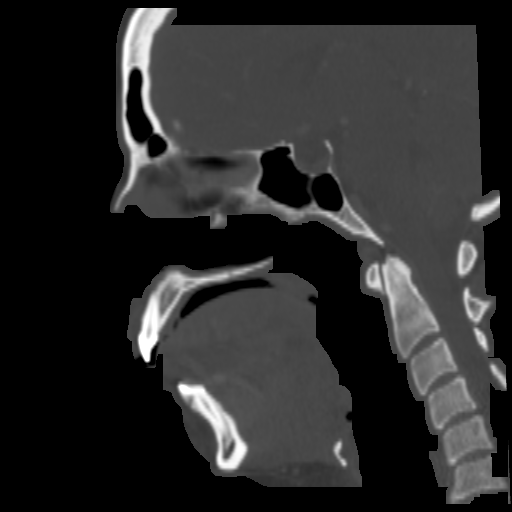
[im 57/86  bone]
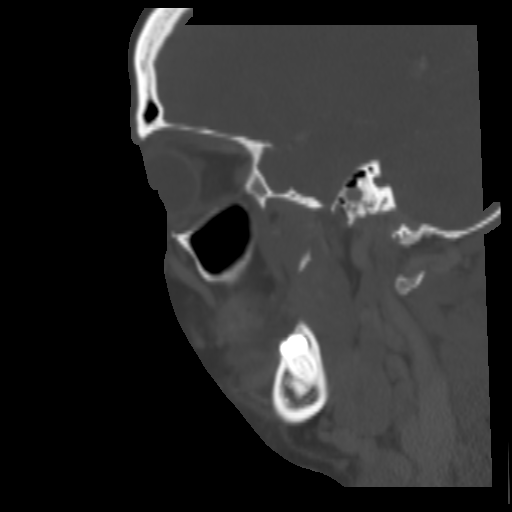
[im 71/86  bone]
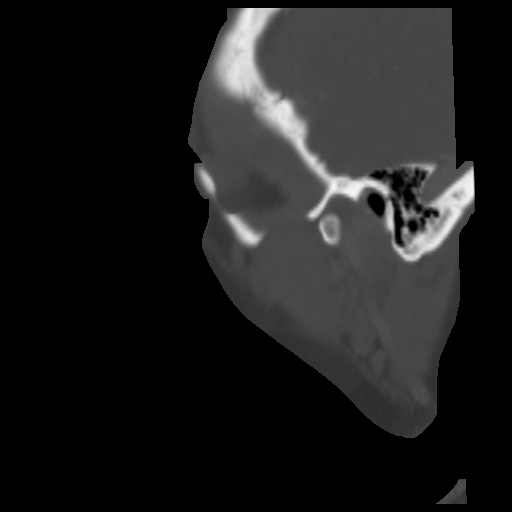

[Series 401: reformatted · coronal · 0.37mm/px · 2 of 80 slices shown (2 of 2)]
[im 16/80  bone]
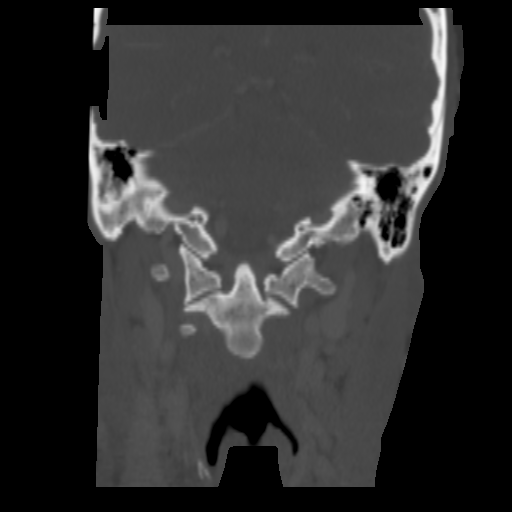
[im 32/80  bone]
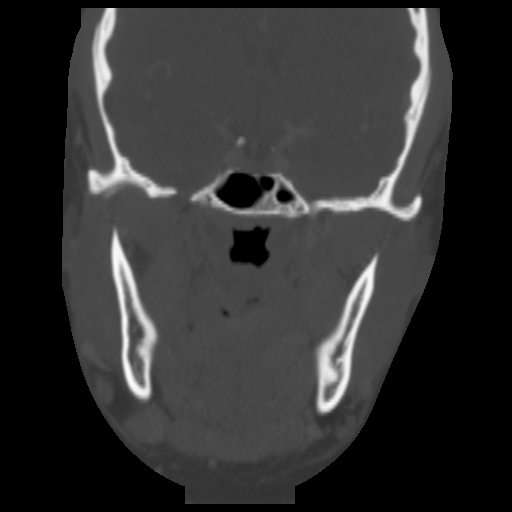

[16 of 30 positions shown; findings below may reference images not displayed]

FINDINGS: Infiltration is seen involving the subcutaneous fat in
the right maxillary region, consistent with cellulitis.  There is
no evidence of soft tissue abscess or soft tissue gas.  A large
carie is seen involving the right maxillary third molar.  No other
bone abnormality identified.

Diffuse mucosal thickening is seen involve the right maxillary
sinus however there is no evidence of sinus air fluid levels.
IMPRESSION: 1.  Right maxillary facial cellulitis.  No evidence of soft tissue
abscess.
2.  Large carie involving the right maxillary third molar.
3.  Chronic right maxillary sinusitis.

## 2010-04-22 IMAGING — CR DG KNEE COMPLETE 4+V*L*
4 series · 4 of 4 positions shown · non-contrast
Comparison: None.

CLINICAL DATA: Knee pain, popping sensation

LEFT KNEE - COMPLETE 4+ VIEW

[view not recorded (1 of 4)]
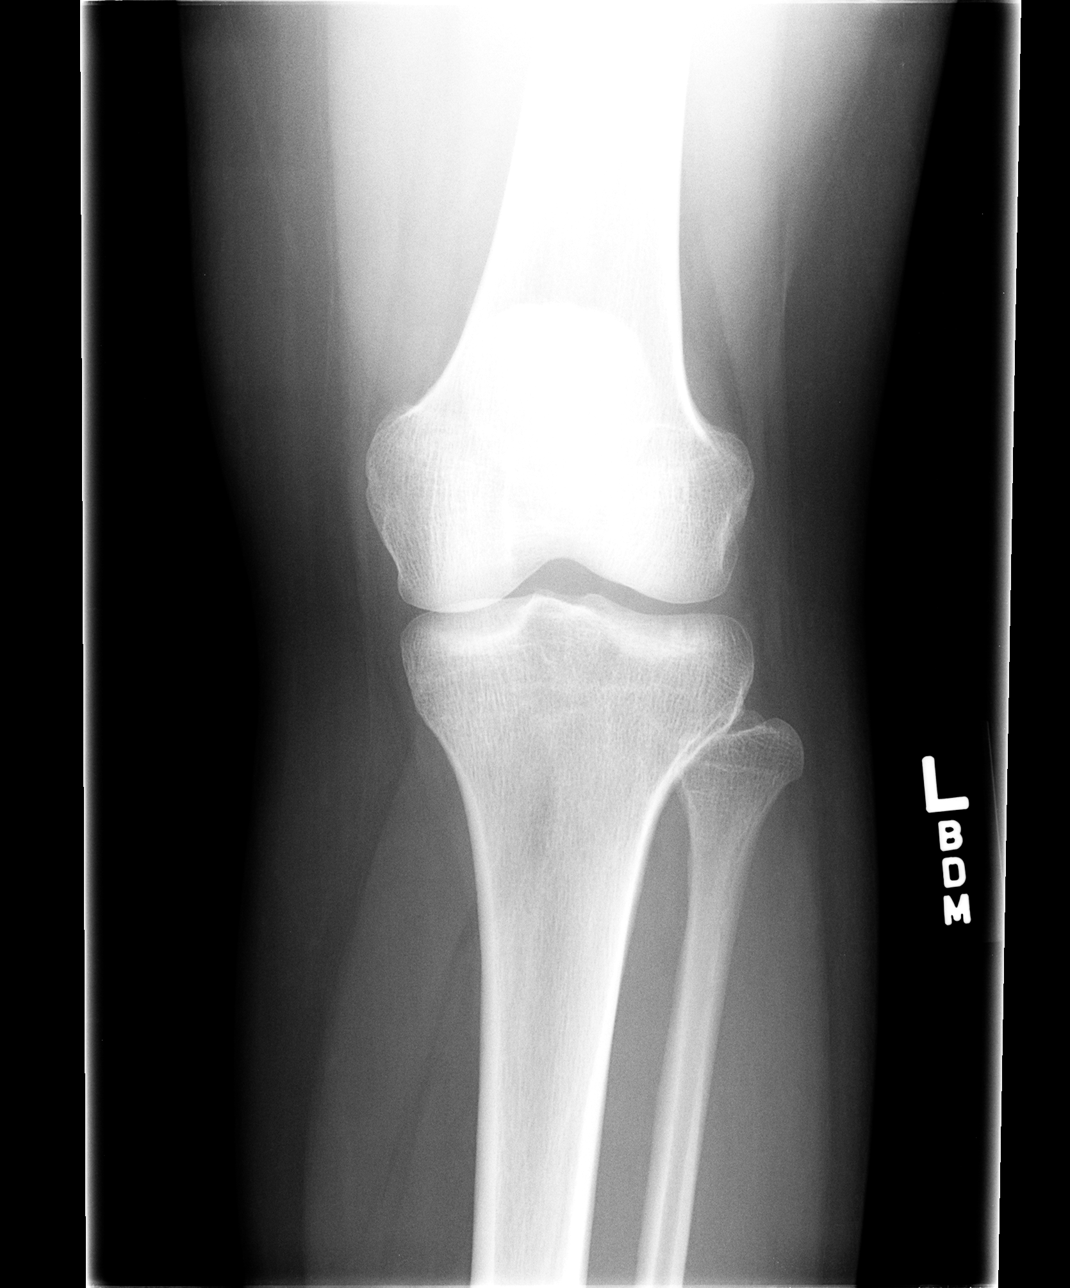

[view not recorded (2 of 4)]
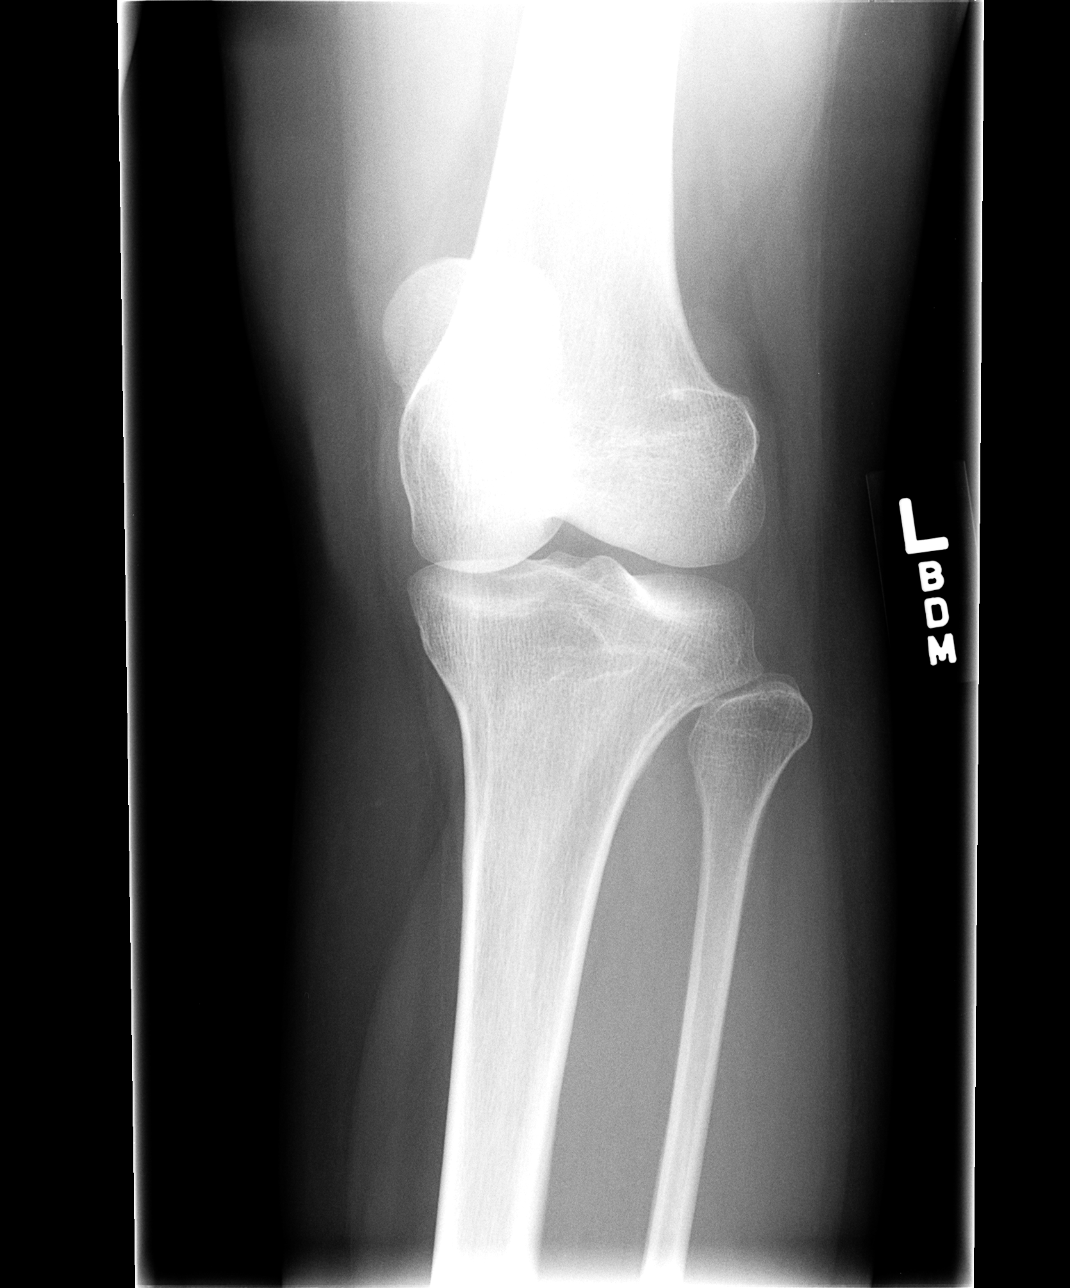

[view not recorded (3 of 4)]
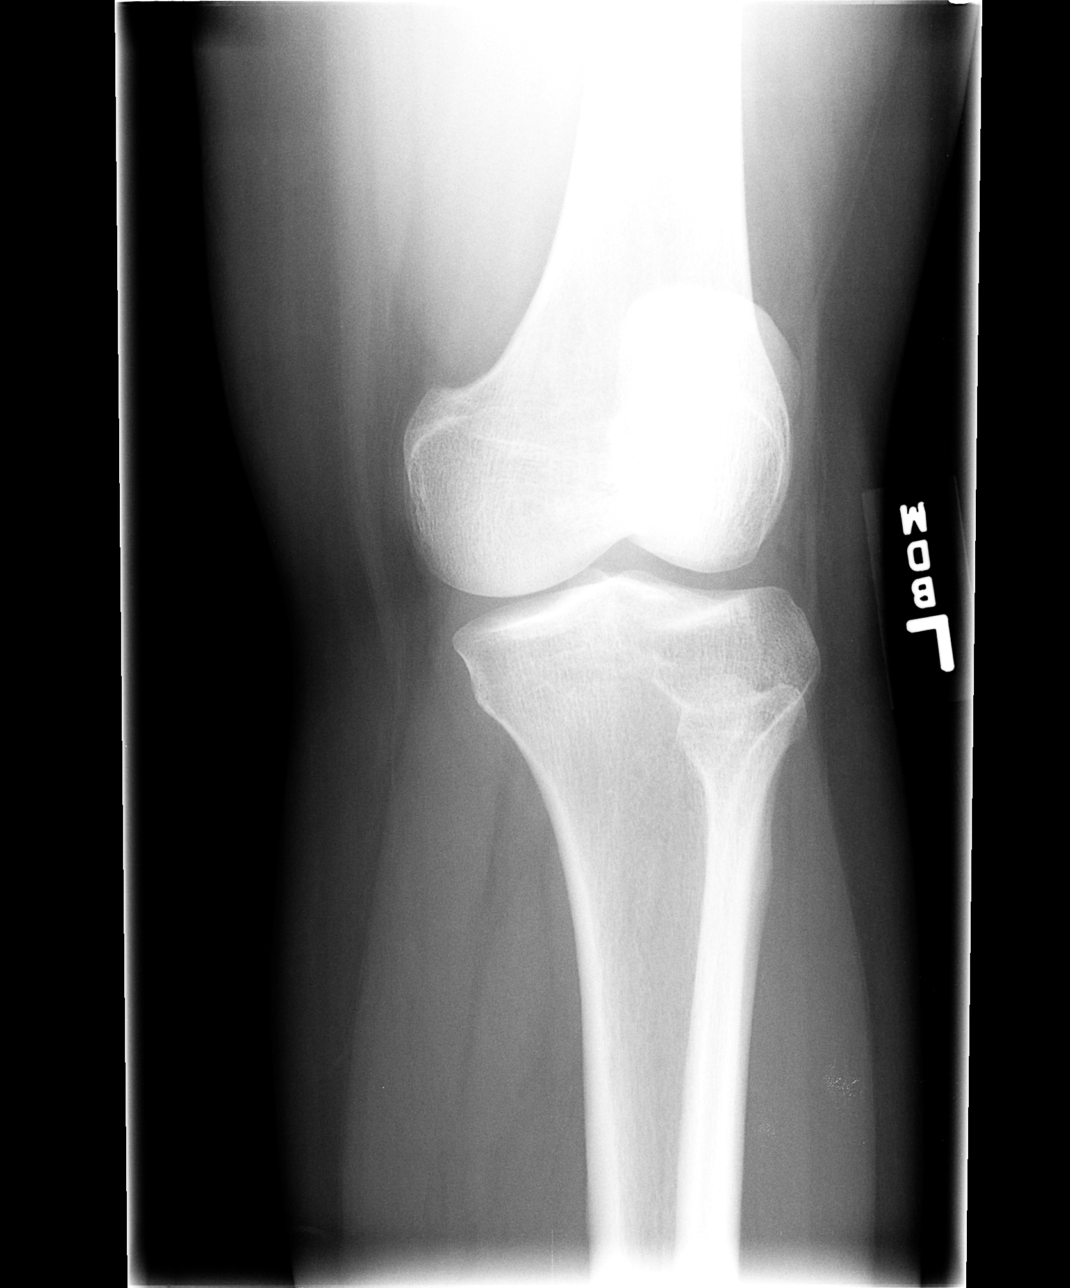

[view not recorded (4 of 4)]
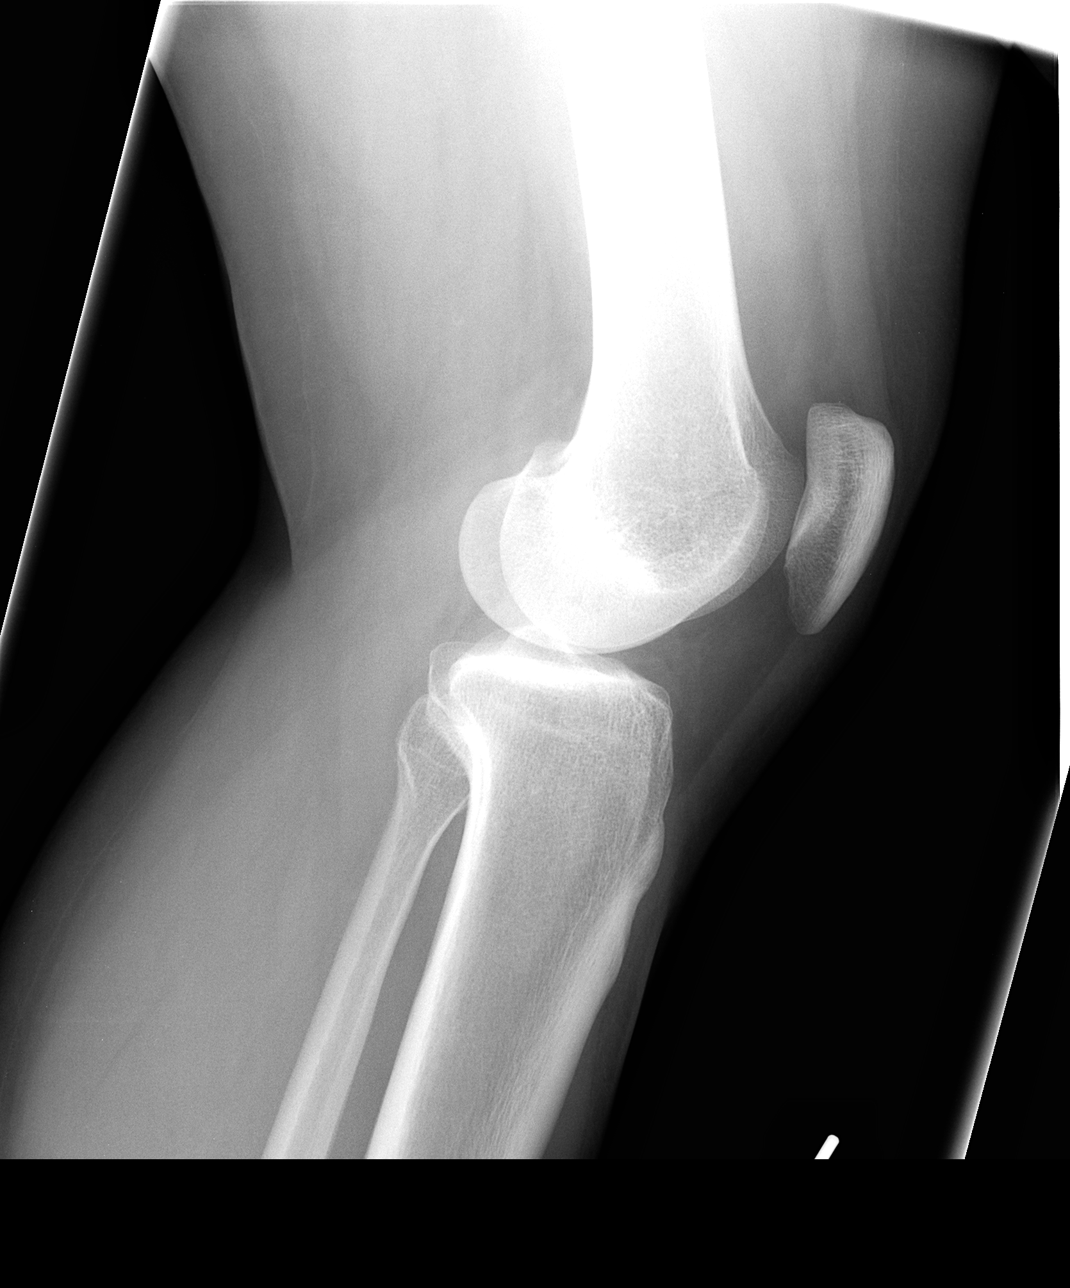

[4 of 4 positions shown; findings below may reference images not displayed]

FINDINGS: Normal alignment.  Preserved joint spaces.  No fracture
or large effusion by plain radiography.
IMPRESSION: No acute finding.

## 2010-04-24 ENCOUNTER — Emergency Department (HOSPITAL_COMMUNITY): Admission: EM | Admit: 2010-04-24 | Discharge: 2010-04-25 | Payer: Self-pay | Admitting: Emergency Medicine

## 2010-05-18 IMAGING — CT CT HEAD W/O CM
1 of 2 series · 13 of 30 positions shown, 17 images · non-contrast
Comparison: 04/05/2008

CLINICAL DATA: Headache for 4 days.  Seizures.

CT HEAD WITHOUT CONTRAST
TECHNIQUE: Contiguous axial images were obtained from the base of
the skull through the vertex without contrast.

[Series 2: brain · axial · 0.47mm/px · z∈[+90,+210]mm · 13 of 28 slices shown, 17 images]
[im 2/28  brain]
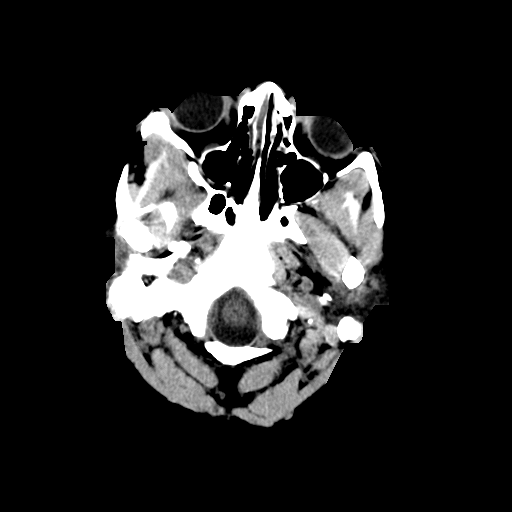
[im 2/28  bone]
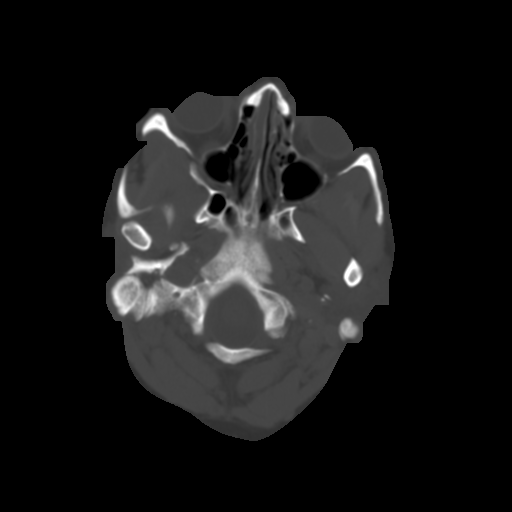
[im 4/28  brain]
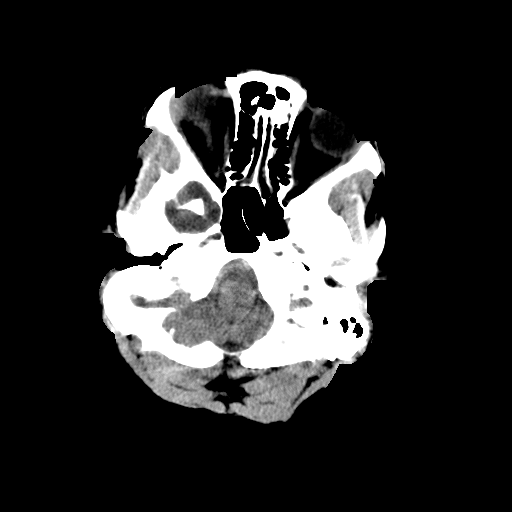
[im 6/28  brain]
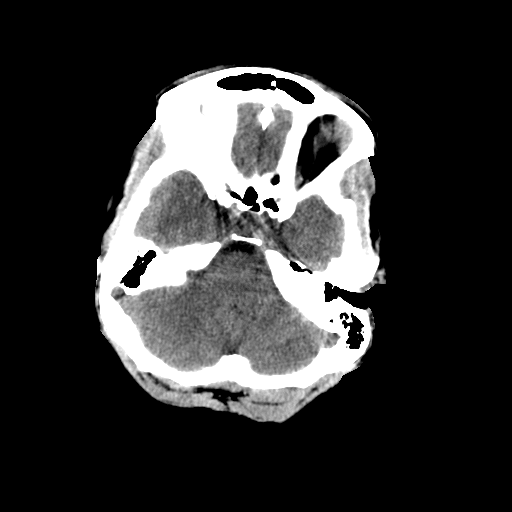
[im 8/28  brain]
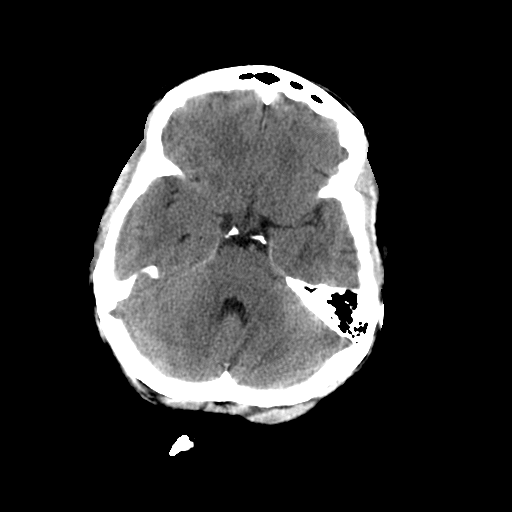
[im 10/28  brain]
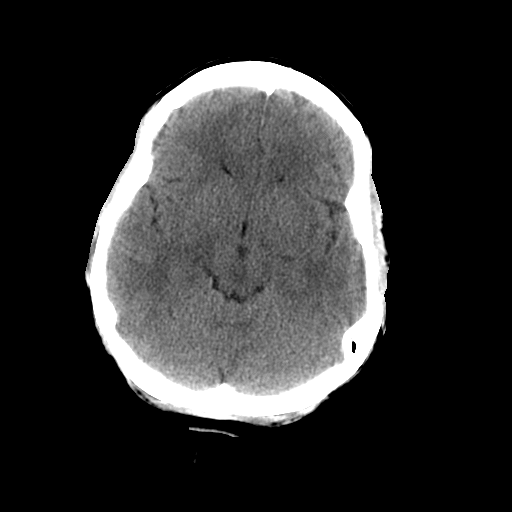
[im 10/28  bone]
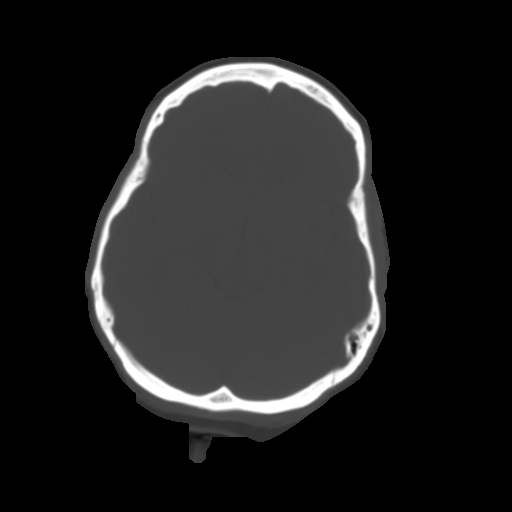
[im 12/28  brain]
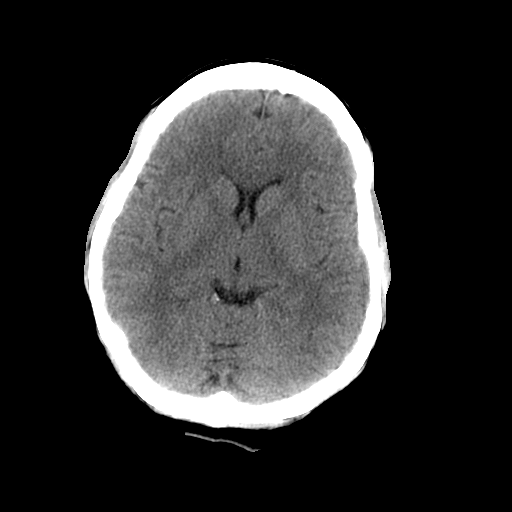
[im 14/28  brain]
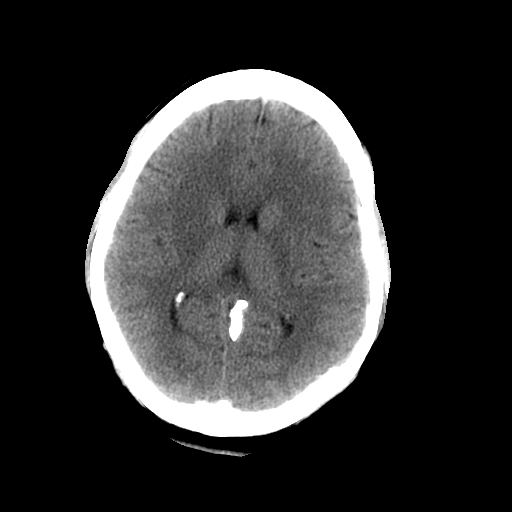
[im 16/28  brain]
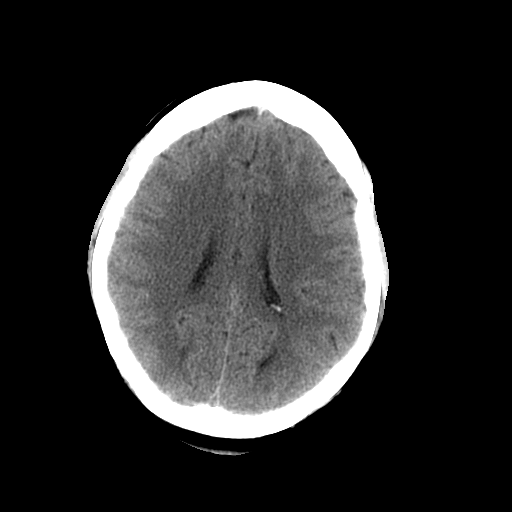
[im 18/28  brain]
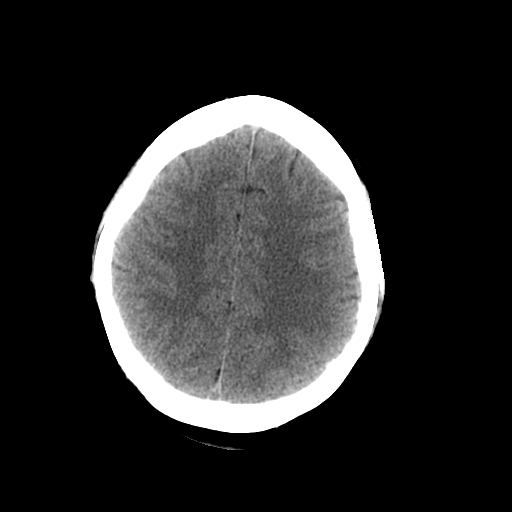
[im 18/28  bone]
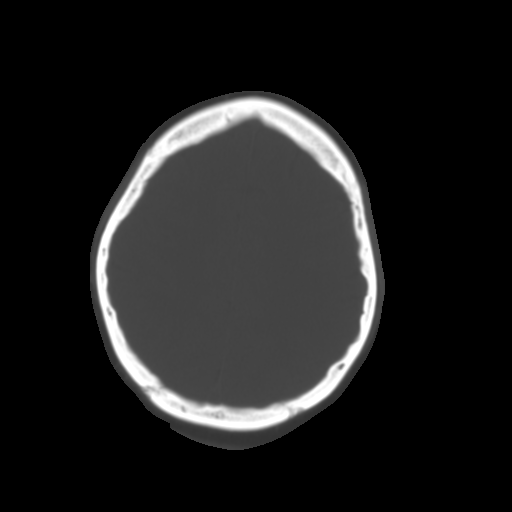
[im 20/28  brain]
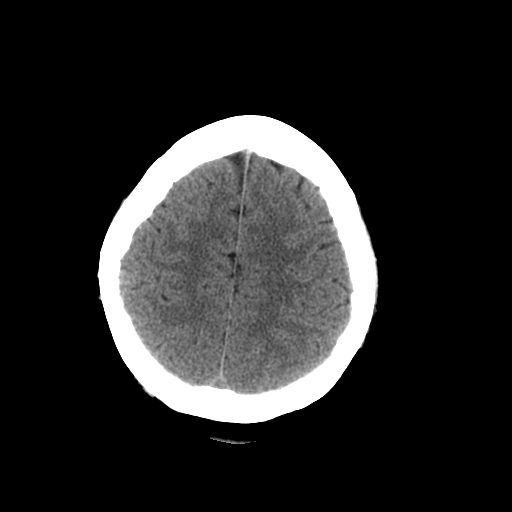
[im 22/28  brain]
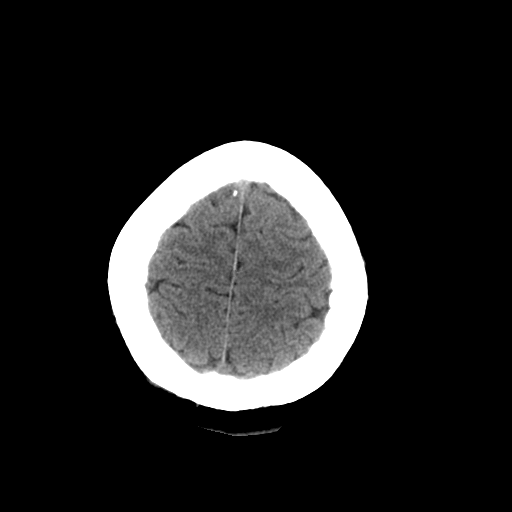
[im 24/28  brain]
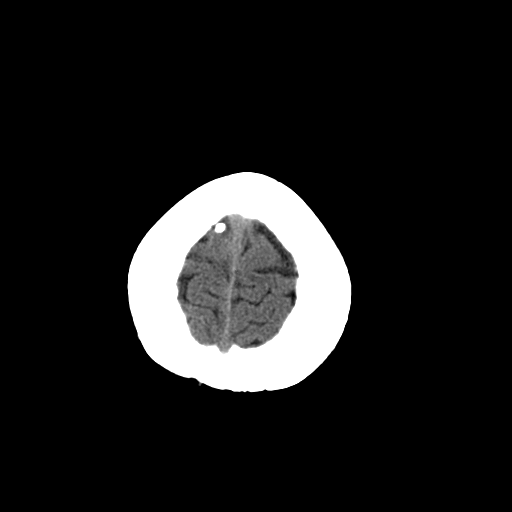
[im 26/28  brain]
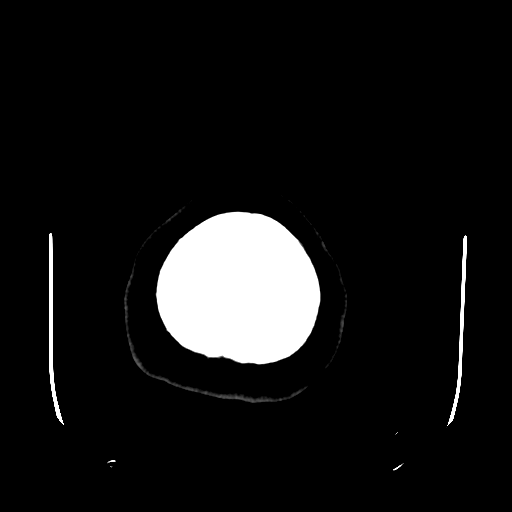
[im 26/28  bone]
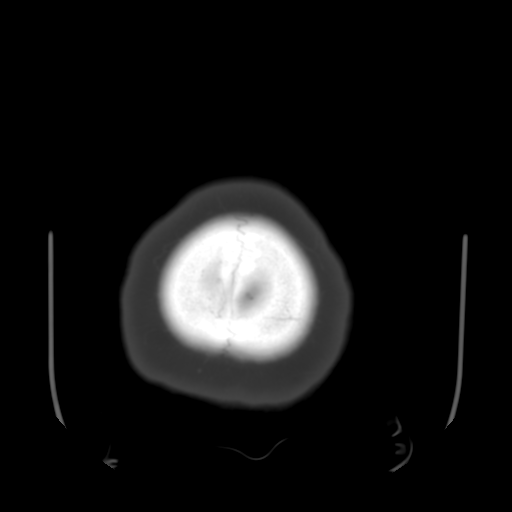

[13 of 30 positions shown; findings below may reference images not displayed]

FINDINGS: Bone windows demonstrate mild mucosal thickening in the
ethmoid air cells. Clear mastoid air cells.

Soft tissue windows demonstrate no  mass lesion, hemorrhage,
hydrocephalus, acute infarct, intra-axial, or extra-axial fluid
collection.
IMPRESSION: 1. No acute intracranial abnormality.
2.  Mild sinus disease.

## 2010-05-23 IMAGING — CT CT HEAD W/O CM
1 series · 16 of 30 positions shown, 20 images · non-contrast
Comparison: 09/19/2009

CLINICAL DATA: Unresponsive, seizure.

CT HEAD WITHOUT CONTRAST
TECHNIQUE: Contiguous axial images were obtained from the base of
the skull through the vertex without contrast.

[Series 2: head trauma 4.8 h37s · axial · 0.43mm/px · z∈[-71,+64]mm · 16 of 30 slices shown, 20 images]
[im 2/30  brain]
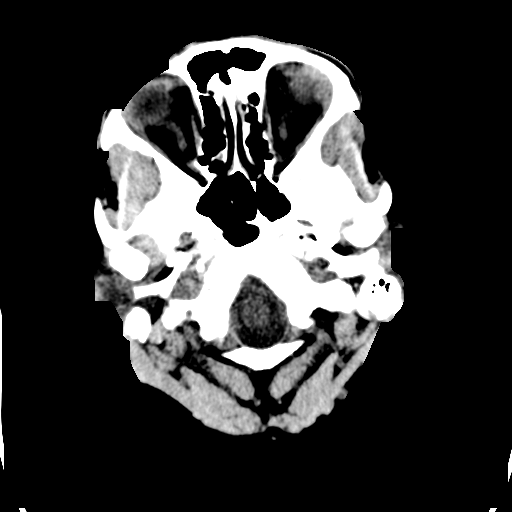
[im 2/30  bone]
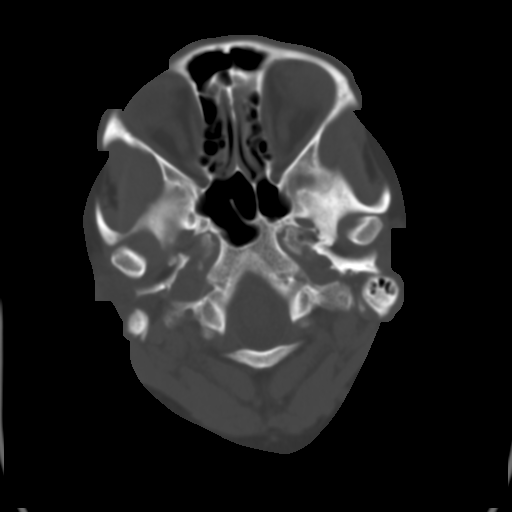
[im 4/30  brain]
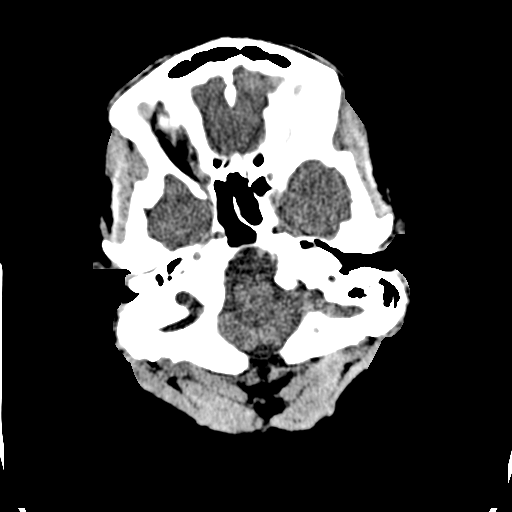
[im 6/30  brain]
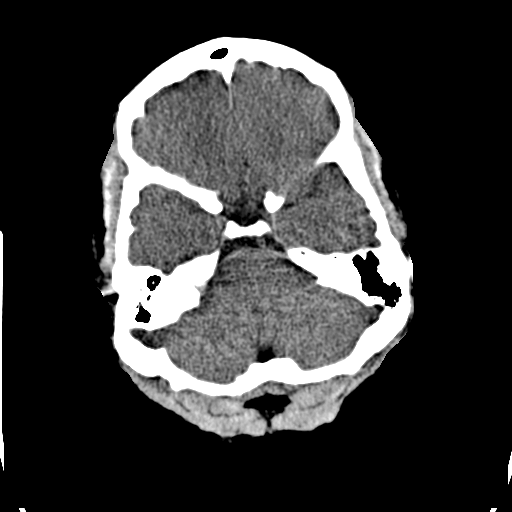
[im 8/30  brain]
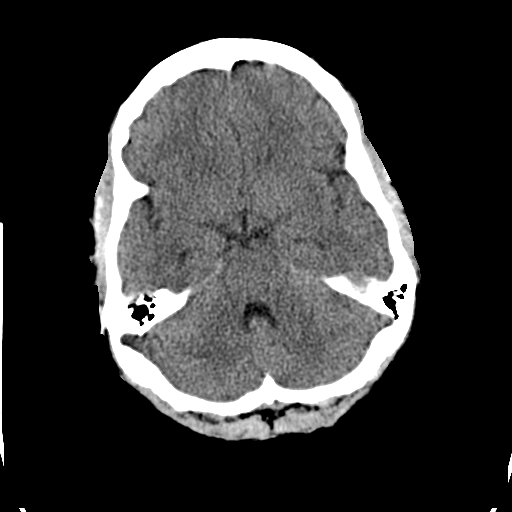
[im 9/30  brain]
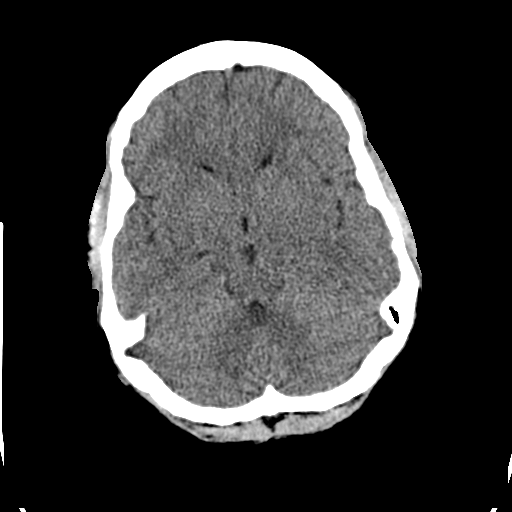
[im 9/30  bone]
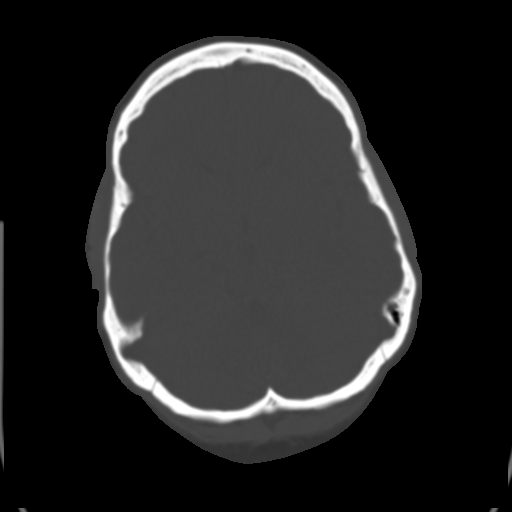
[im 11/30  brain]
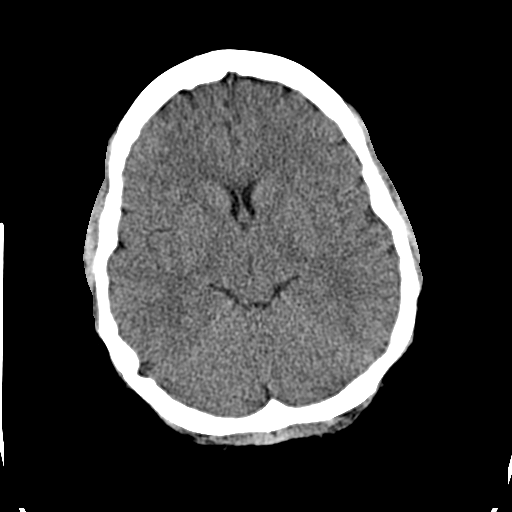
[im 13/30  brain]
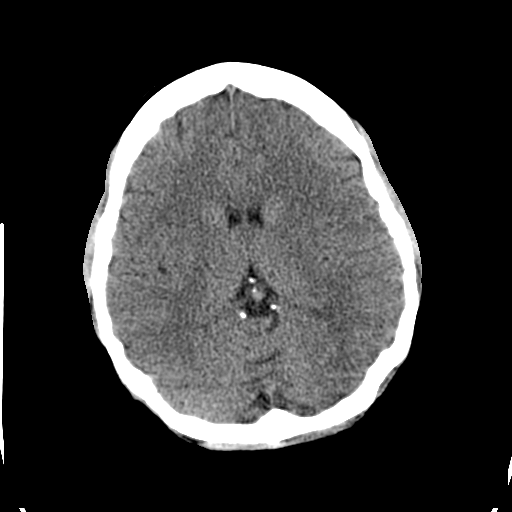
[im 15/30  brain]
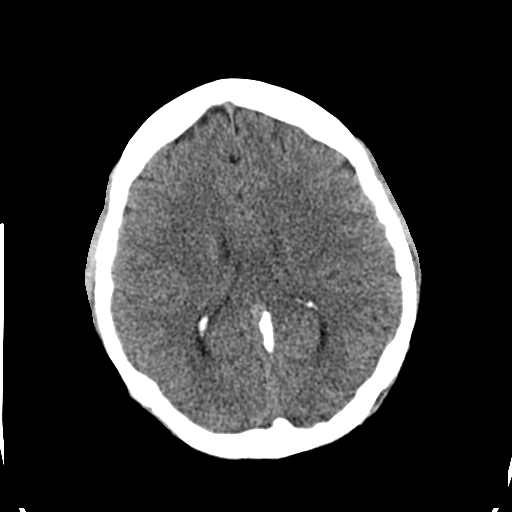
[im 16/30  brain]
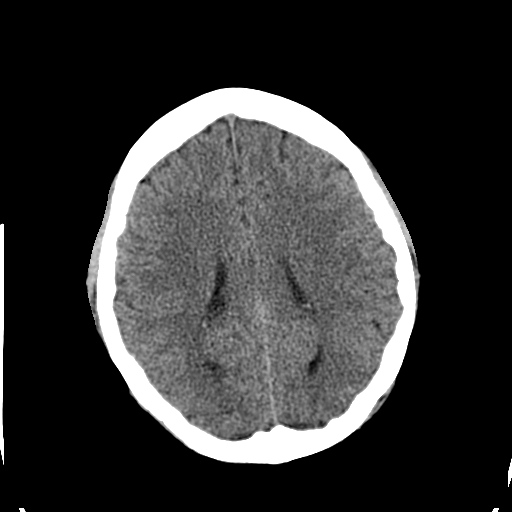
[im 16/30  bone]
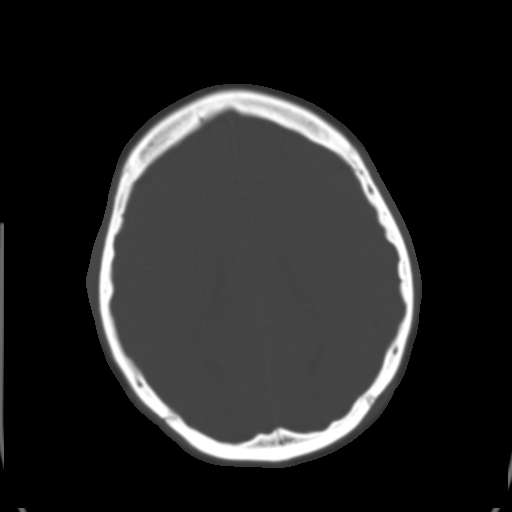
[im 18/30  brain]
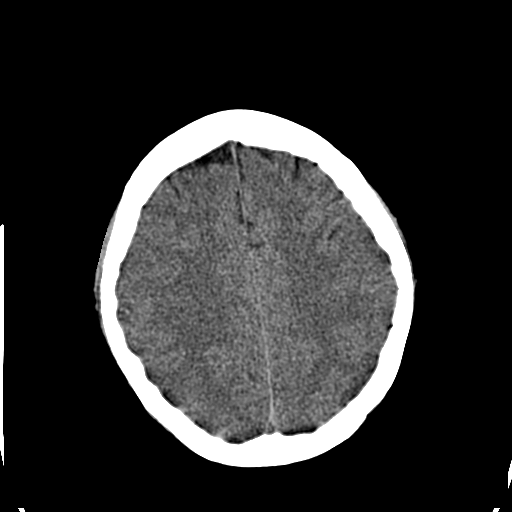
[im 20/30  brain]
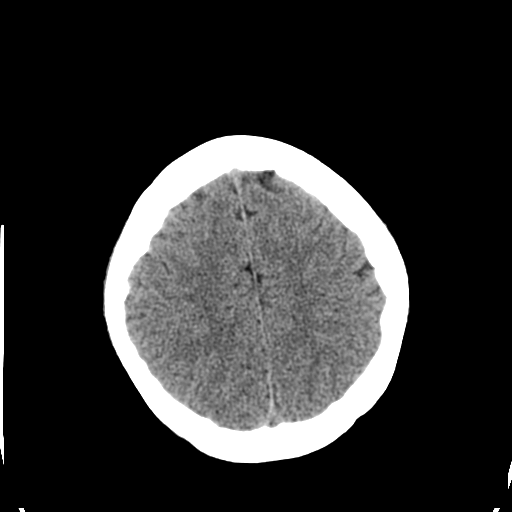
[im 22/30  brain]
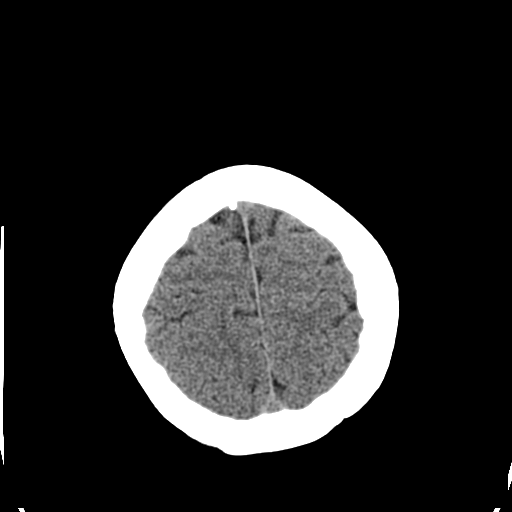
[im 23/30  brain]
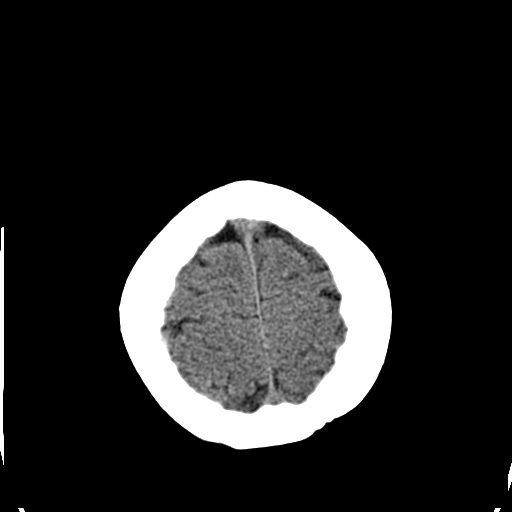
[im 23/30  bone]
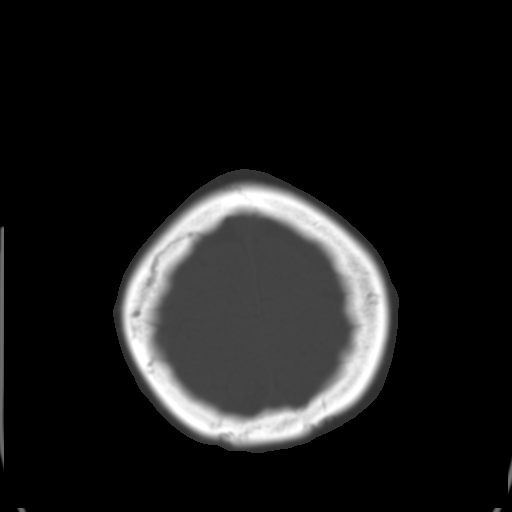
[im 25/30  brain]
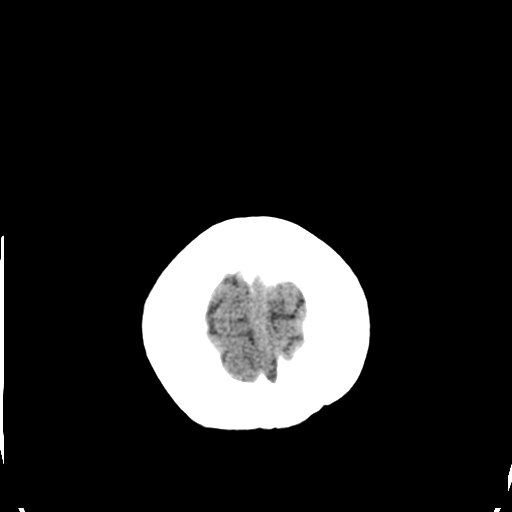
[im 27/30  brain]
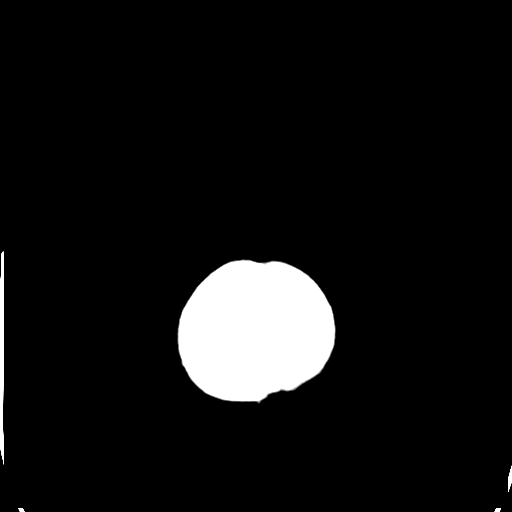
[im 29/30  brain]
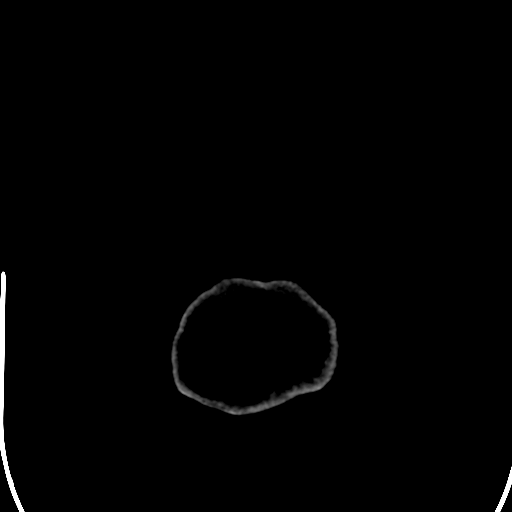

[16 of 30 positions shown; findings below may reference images not displayed]

FINDINGS: No acute intracranial abnormality.  Specifically, no
hemorrhage, hydrocephalus, mass lesion, acute infarction, or
significant intracranial injury.  No acute calvarial abnormality.

Mild mucoperiosteal thickening in the ethmoid air cells.  Mastoids
clear.  Orbital soft tissues unremarkable.
IMPRESSION: No acute intracranial abnormality.

Chronic ethmoid sinusitis.

## 2010-05-24 ENCOUNTER — Emergency Department (HOSPITAL_COMMUNITY): Admission: EM | Admit: 2010-05-24 | Discharge: 2010-05-24 | Payer: Self-pay | Admitting: Emergency Medicine

## 2010-07-08 IMAGING — CR DG ABDOMEN 2V
2 series · 2 of 2 positions shown · non-contrast
Comparison: 07/03/2008

CLINICAL DATA: Constipation, abdominal pain

ABDOMEN - 2 VIEW

[w abdomen upright]
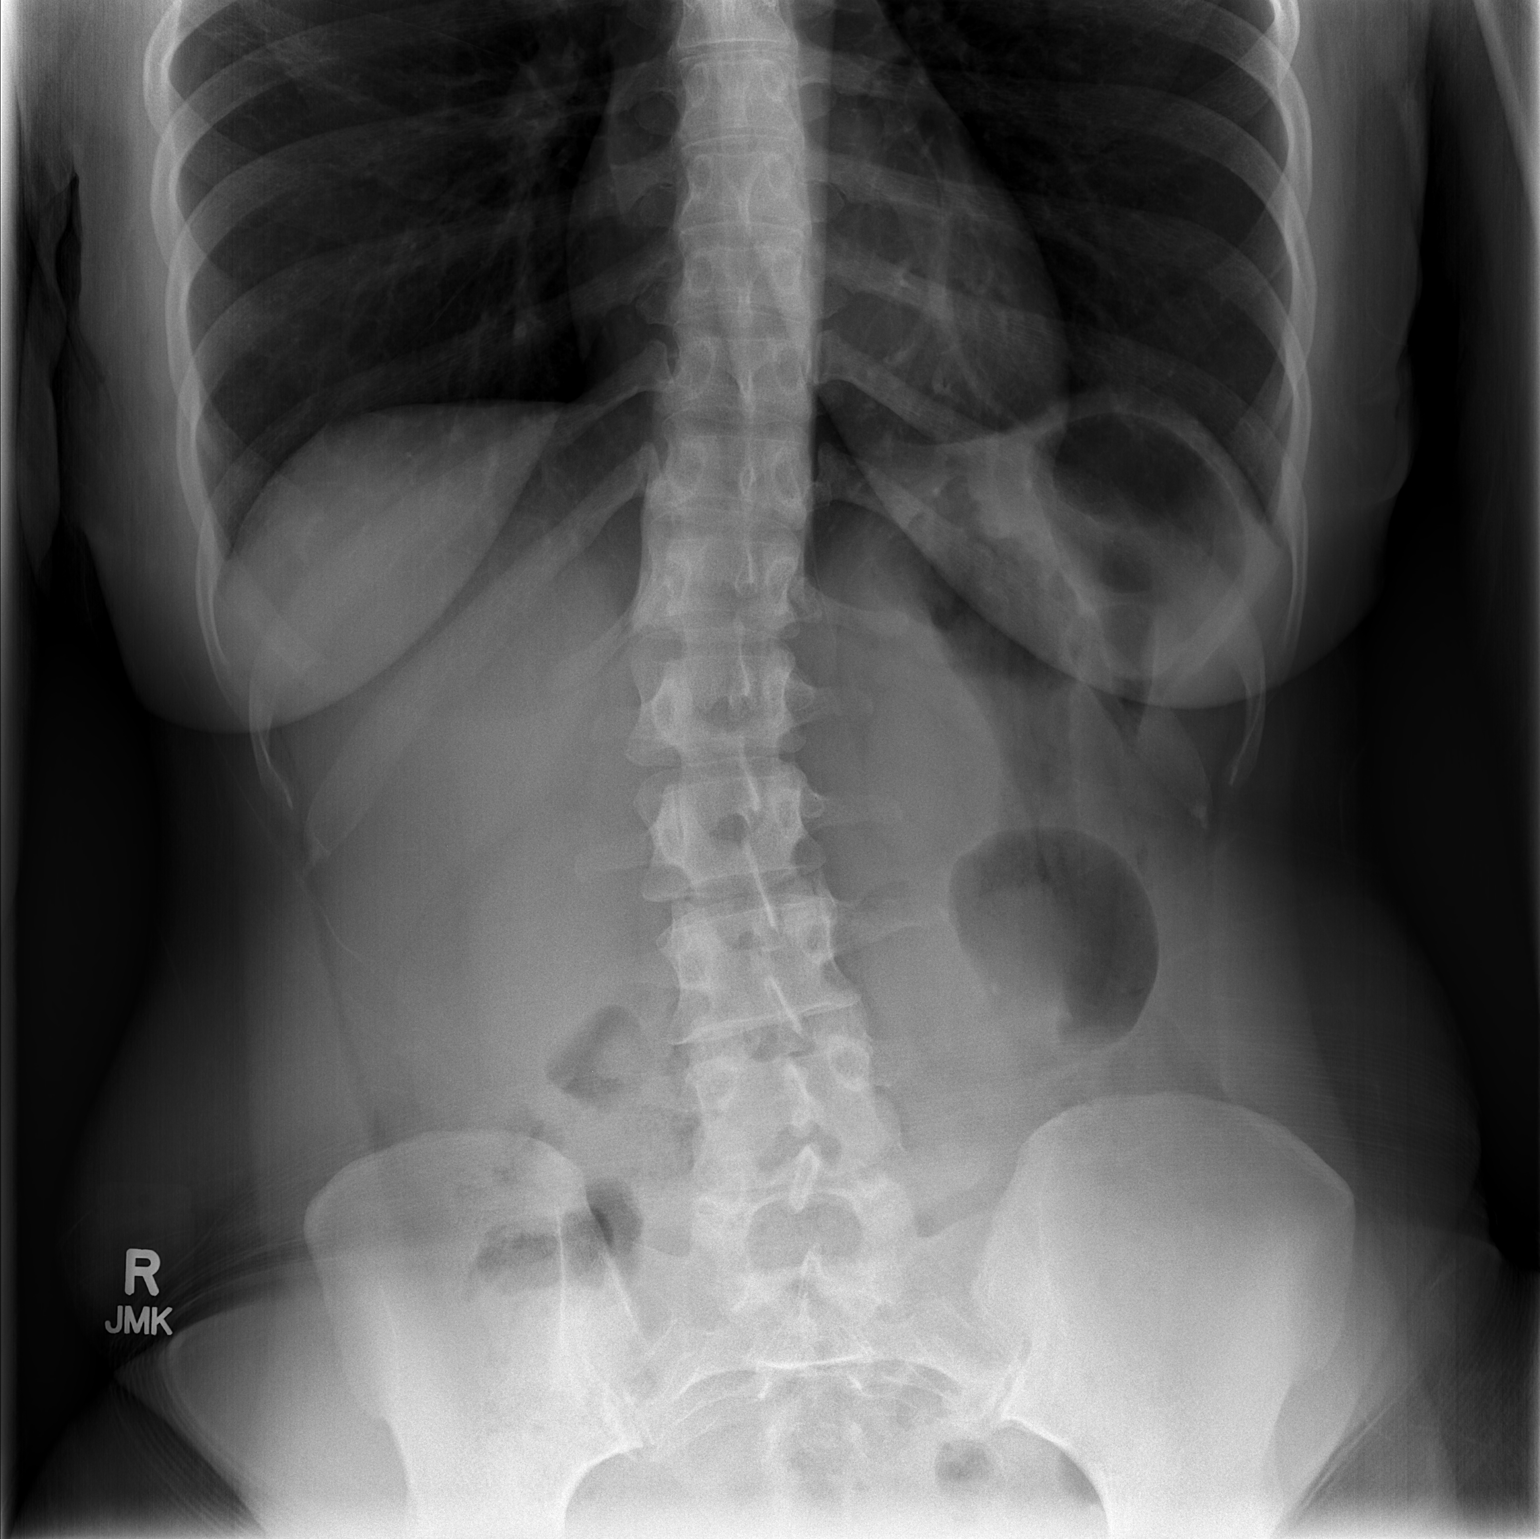

[t abdomen supine]
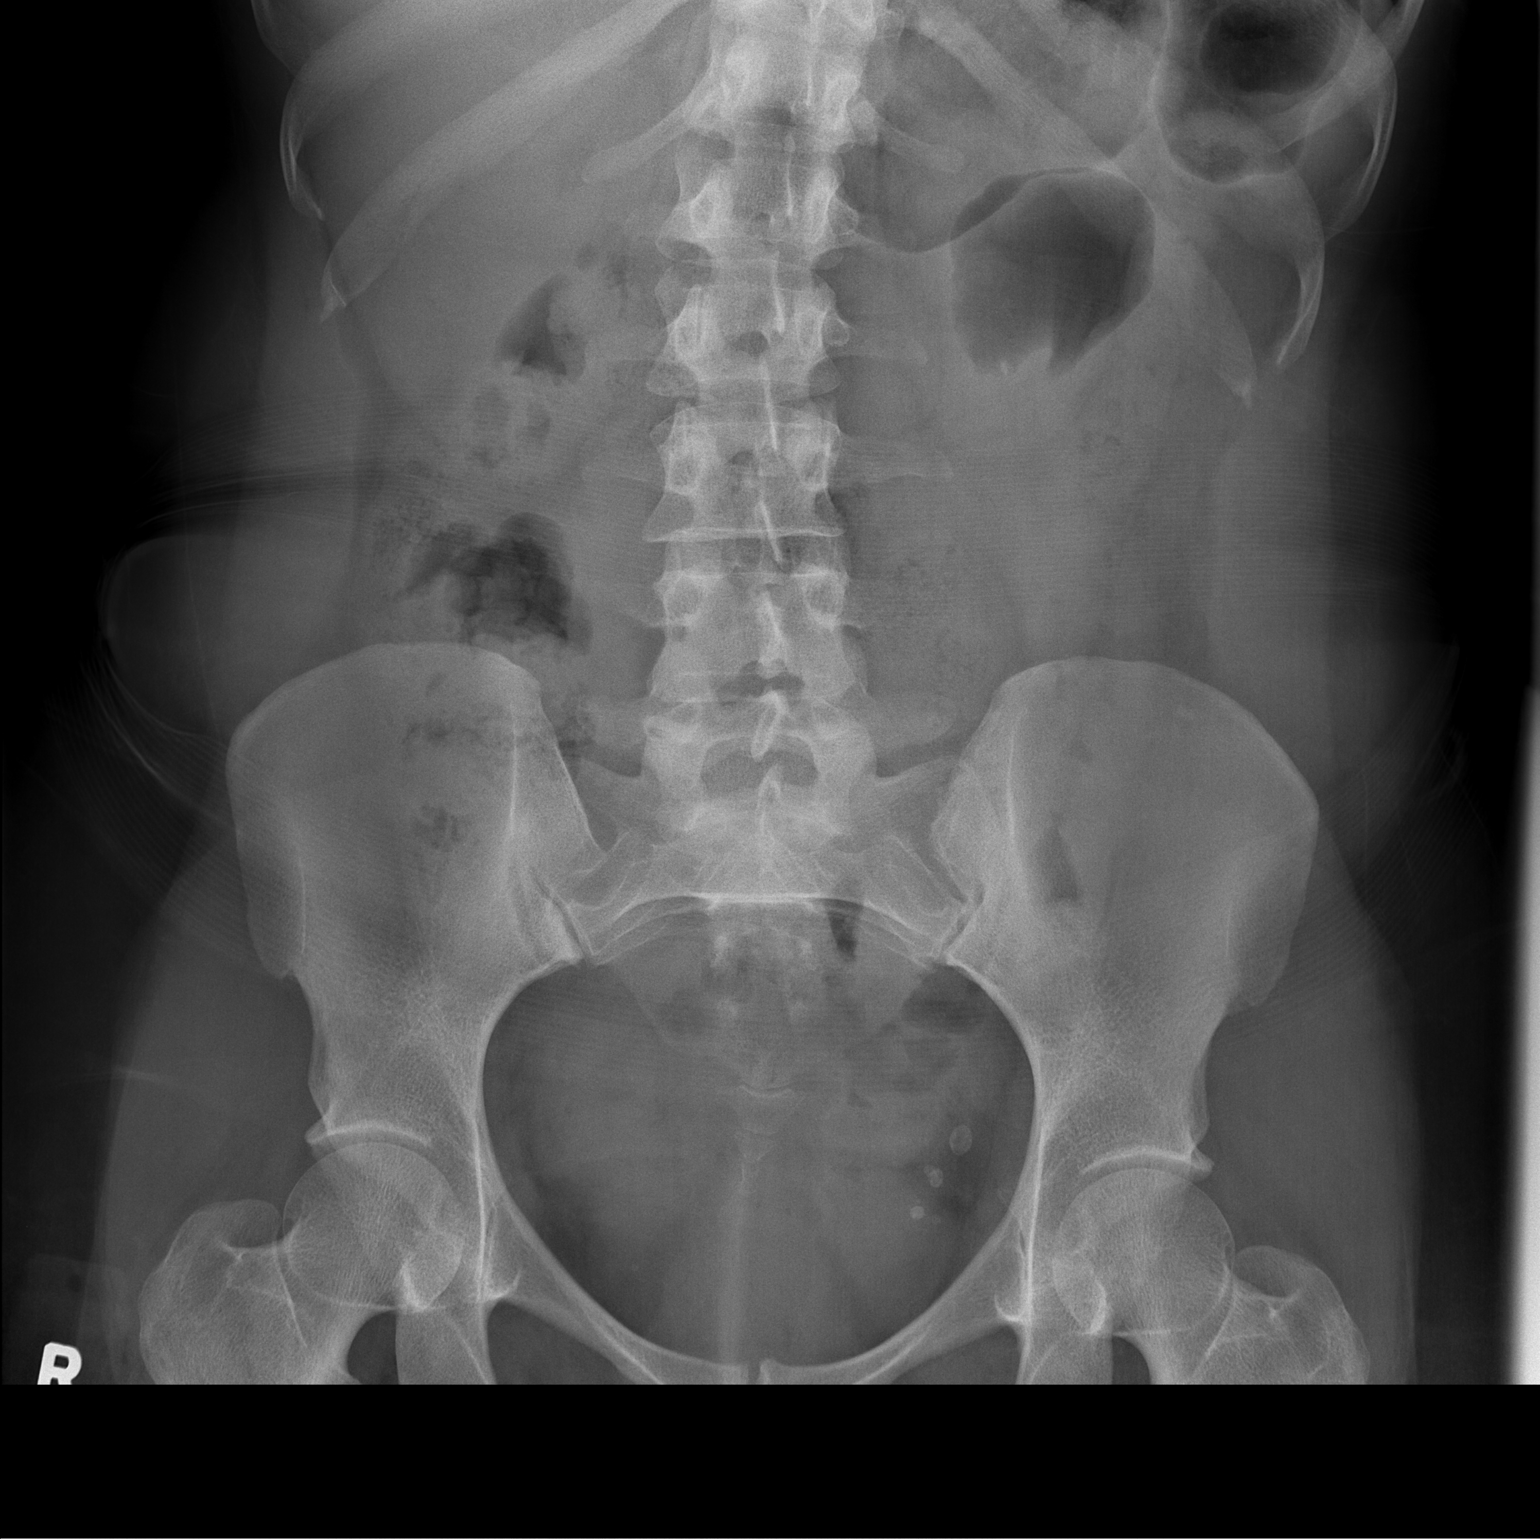

[2 of 2 positions shown; findings below may reference images not displayed]

FINDINGS: No free air.  Visualized lung bases clear.  Small bowel
decompressed.  Moderate fecal material in the proximal colon,
decompressed distally.  Left pelvic phleboliths.  2 mm
calcification in the region of the left renal collecting system.
IMPRESSION: 1.  Nonobstructive bowel gas pattern with moderate proximal colonic
fecal material.
2.  Suspect left nephrolithiasis.

## 2010-07-10 ENCOUNTER — Emergency Department (HOSPITAL_COMMUNITY): Admission: EM | Admit: 2010-07-10 | Discharge: 2010-07-11 | Payer: Self-pay | Admitting: Emergency Medicine

## 2010-07-14 IMAGING — CR DG CHEST 2V
2 series · 2 of 2 positions shown · non-contrast
Comparison: 03/09/2009

CLINICAL DATA: Chest pain, shortness of breath and tachycardia.

CHEST - 2 VIEW

[w chest pa]
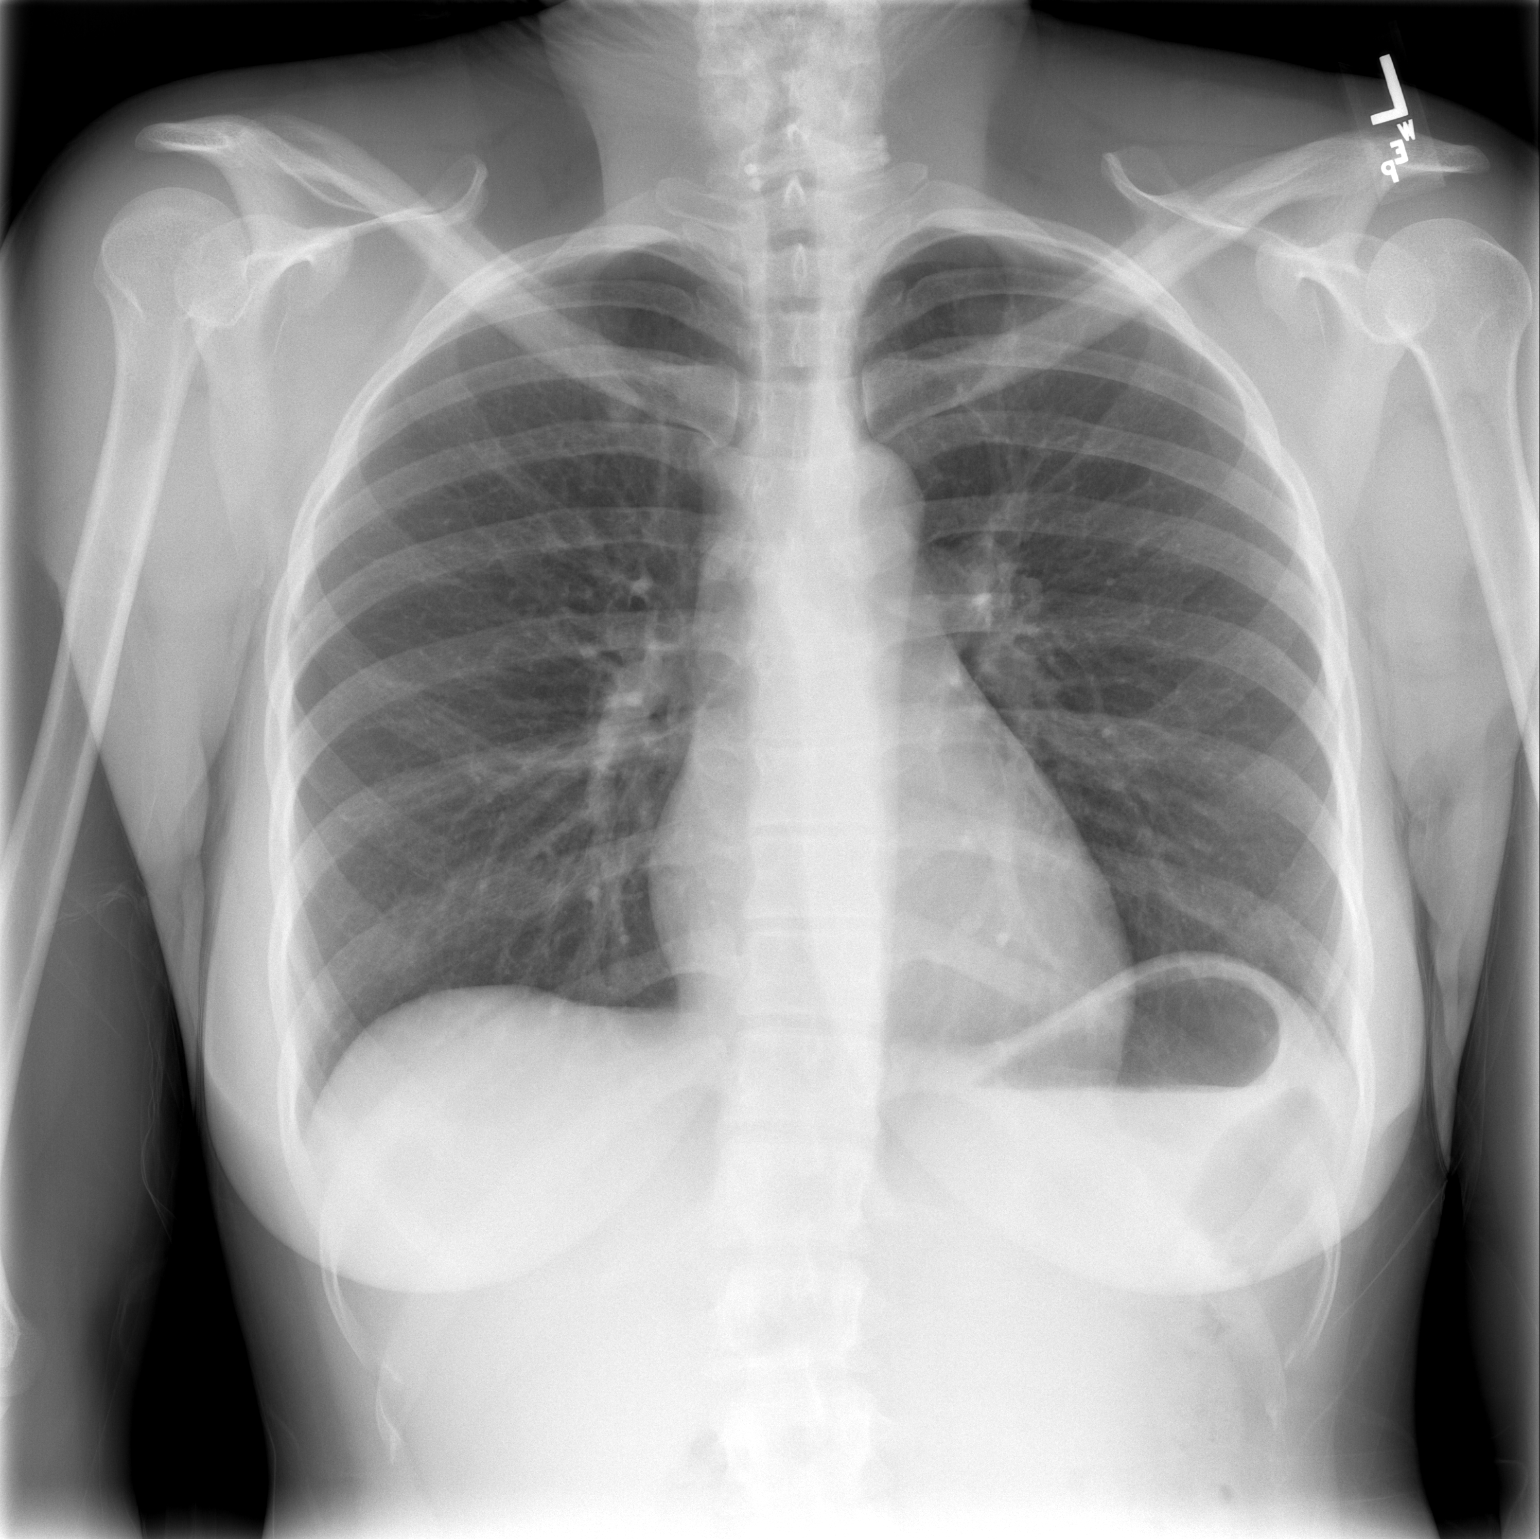

[w chest lat]
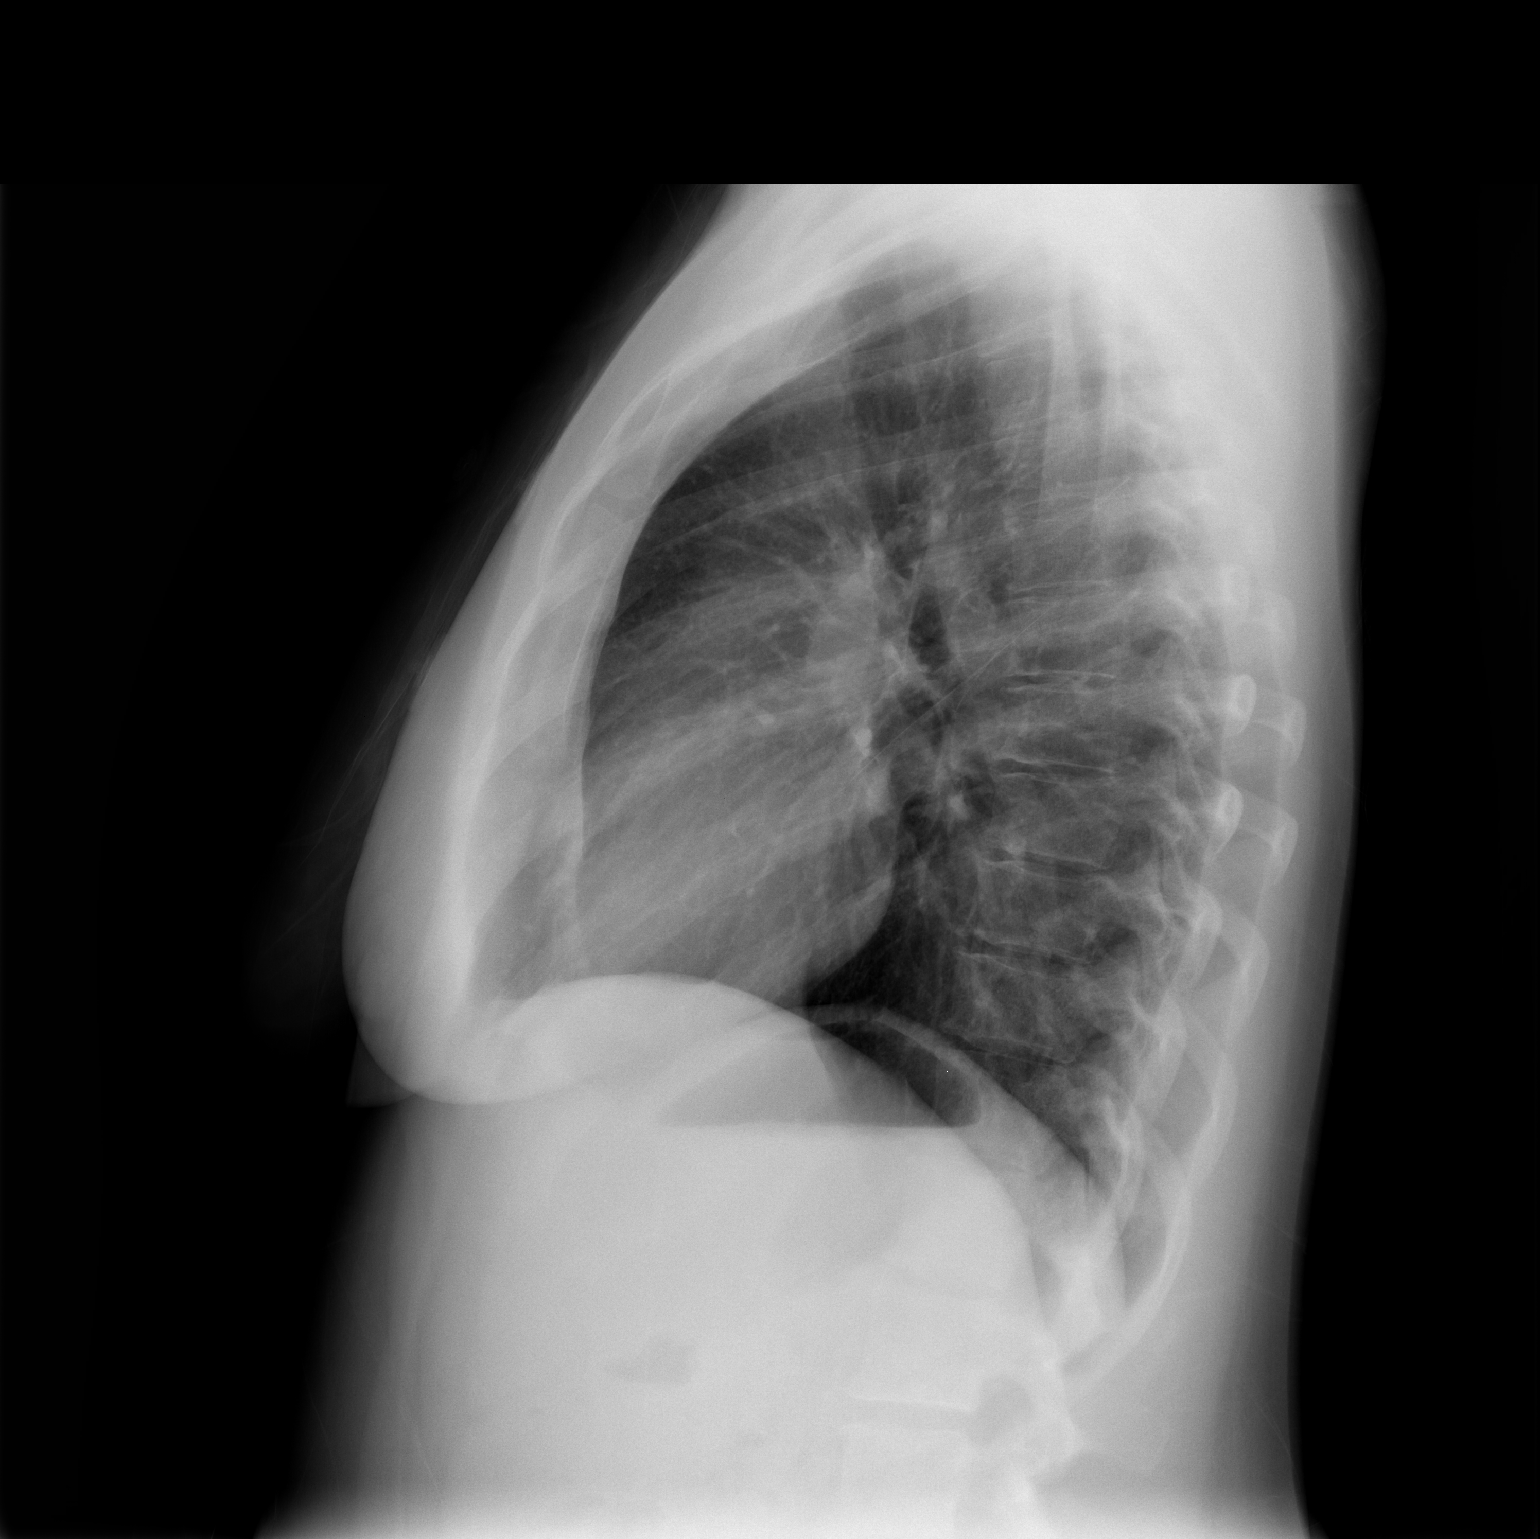

[2 of 2 positions shown; findings below may reference images not displayed]

FINDINGS: Trachea is midline.  Heart size normal.  Lungs are clear.
No pleural fluid.
IMPRESSION: No acute findings.

## 2010-07-23 IMAGING — CT CT HEAD W/O CM
1 series · 16 of 30 positions shown, 20 images · non-contrast
Comparison: 09/24/2009 and earlier.

CLINICAL DATA: 33-year-old female with syncope.  Right temporal
headache and numbness.

CT HEAD WITHOUT CONTRAST
TECHNIQUE: Contiguous axial images were obtained from the base of
the skull through the vertex without contrast.

[Series 2: head routine 4.8 h37s · axial · 0.43mm/px · z∈[-138,-9]mm · 16 of 30 slices shown, 20 images]
[im 2/30  brain]
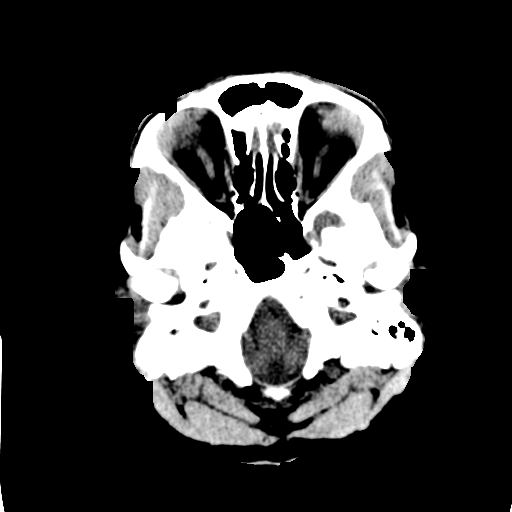
[im 2/30  bone]
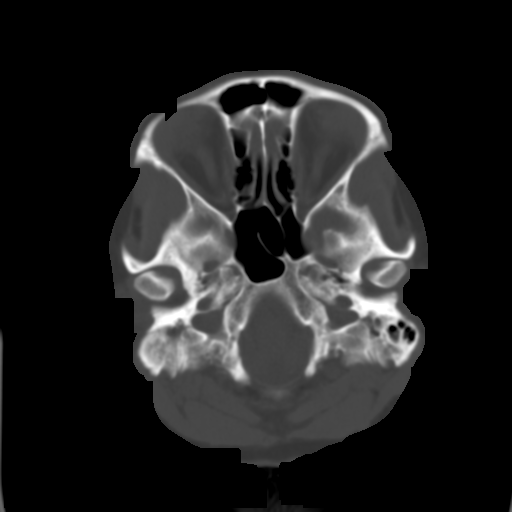
[im 4/30  brain]
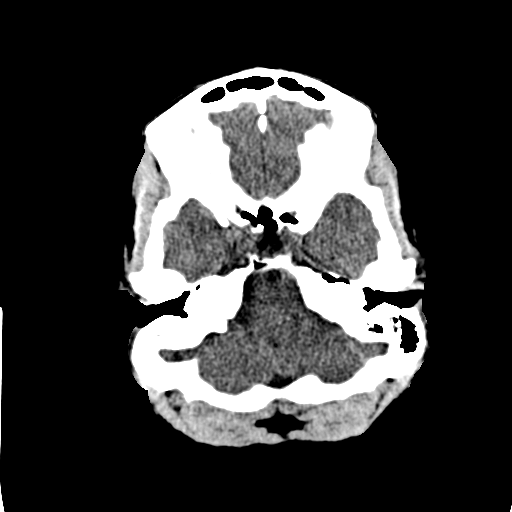
[im 6/30  brain]
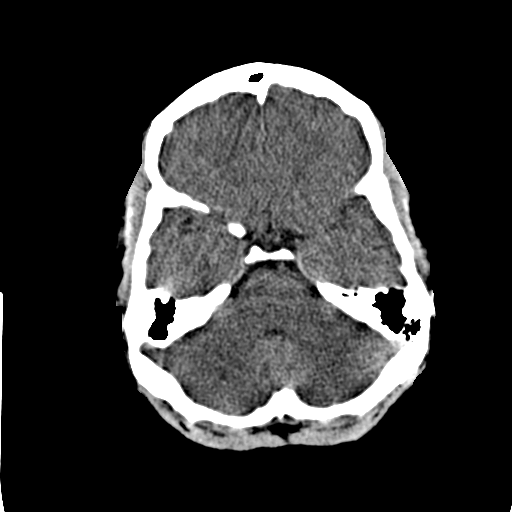
[im 8/30  brain]
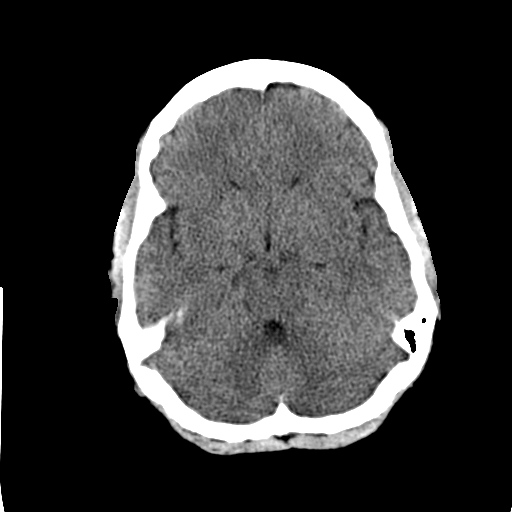
[im 9/30  brain]
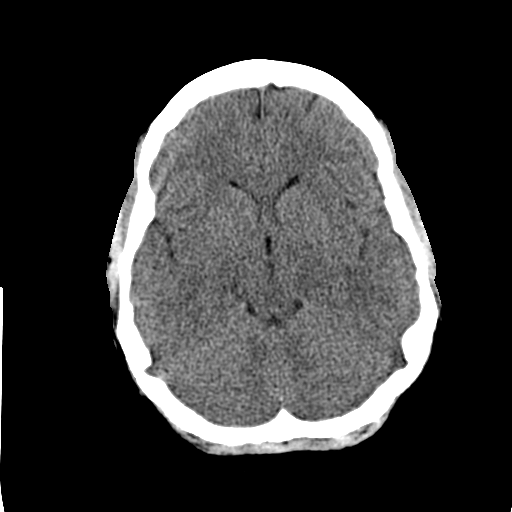
[im 9/30  bone]
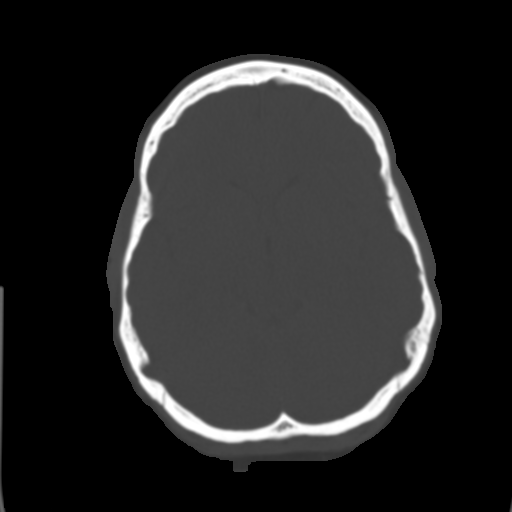
[im 11/30  brain]
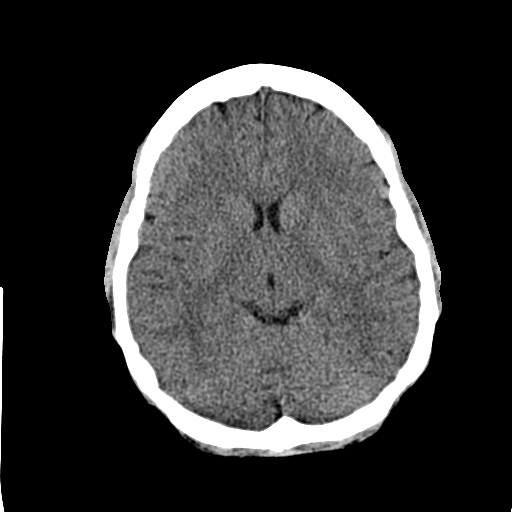
[im 13/30  brain]
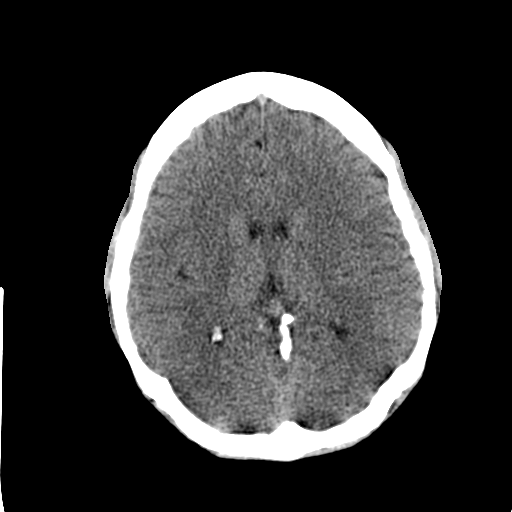
[im 15/30  brain]
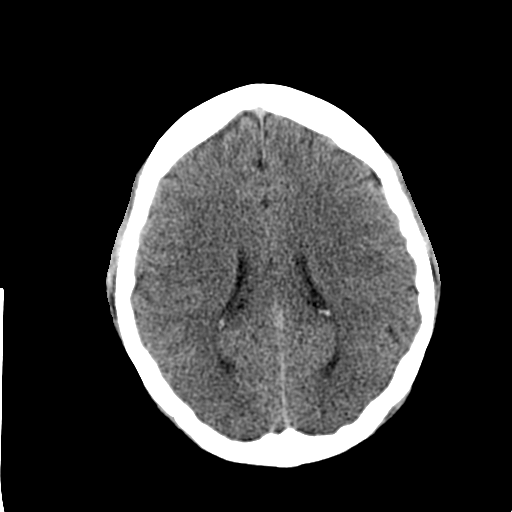
[im 16/30  brain]
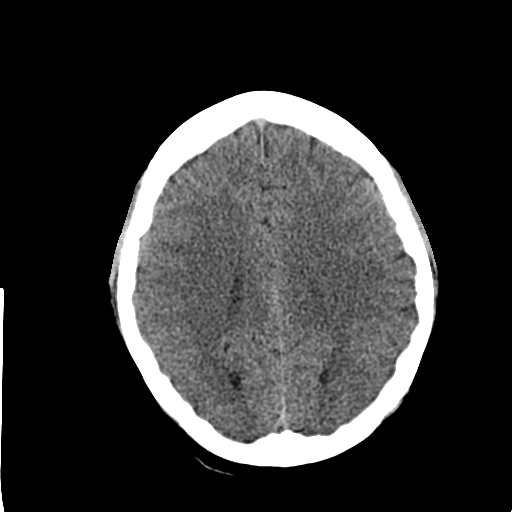
[im 16/30  bone]
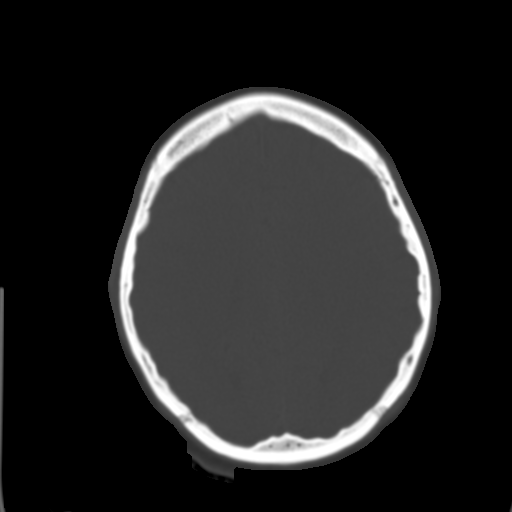
[im 18/30  brain]
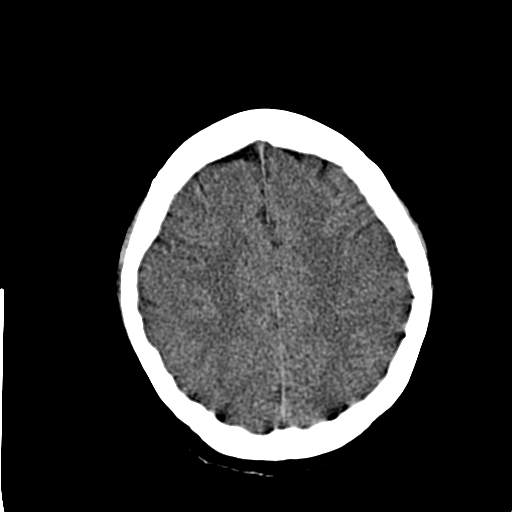
[im 20/30  brain]
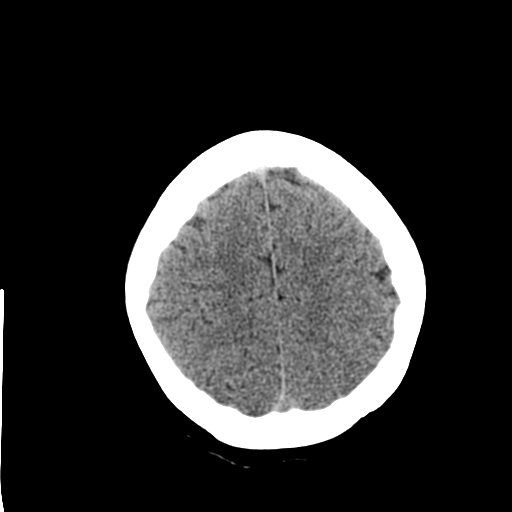
[im 22/30  brain]
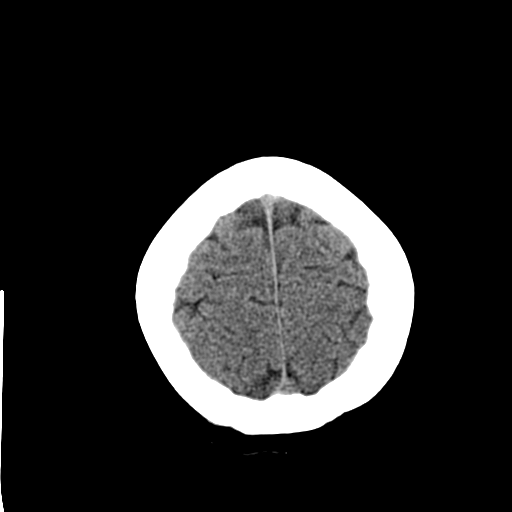
[im 23/30  brain]
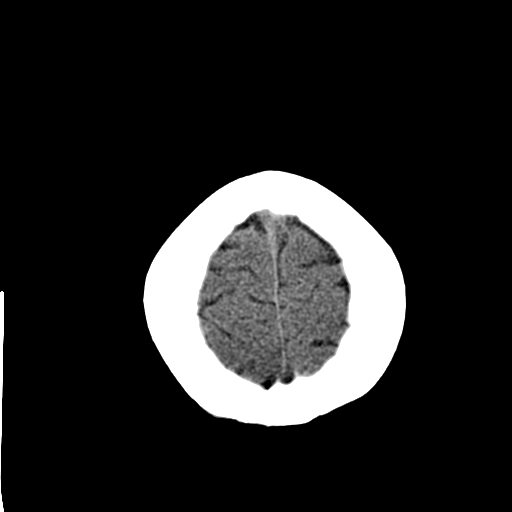
[im 23/30  bone]
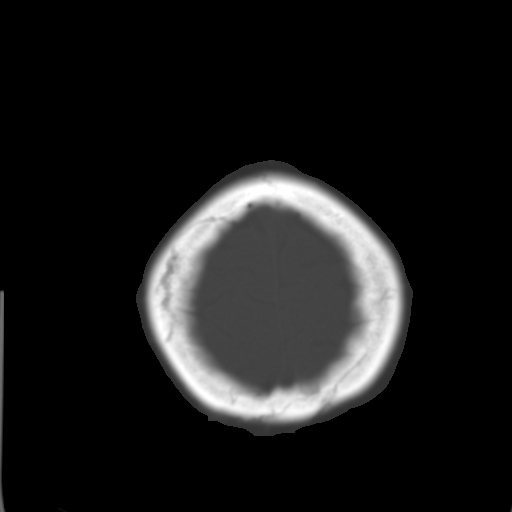
[im 25/30  brain]
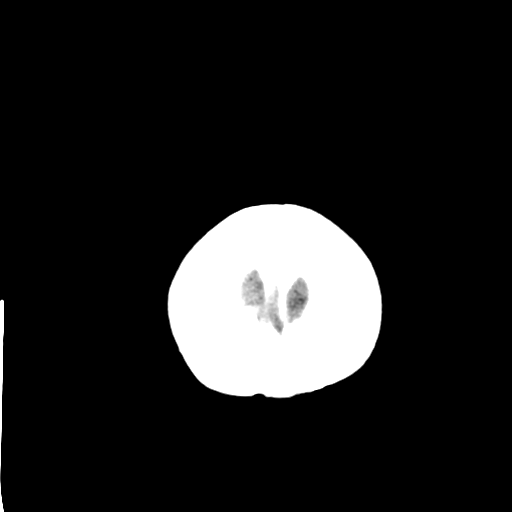
[im 27/30  brain]
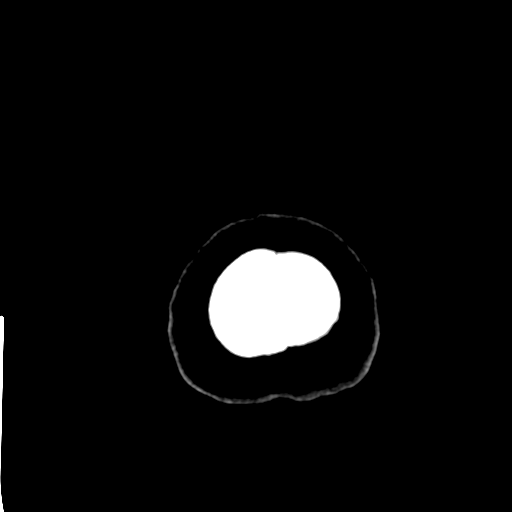
[im 29/30  brain]
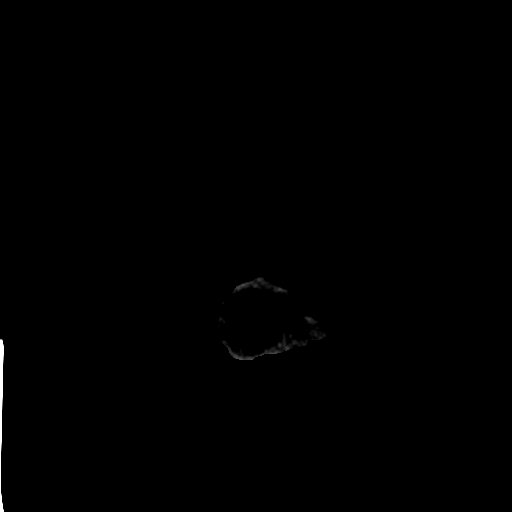

[16 of 30 positions shown; findings below may reference images not displayed]

FINDINGS: Visualized orbits and scalp soft tissues are within
normal limits.  Visualized paranasal sinuses and mastoids are
clear.  No acute osseous abnormality identified.

Cerebral volume is within normal limits for age.  No midline shift,
ventriculomegaly, mass effect, evidence of mass lesion,
intracranial hemorrhage or evidence of cortically based acute
infarction.  Gray-white matter differentiation is within normal
limits throughout the brain.  No suspicious intracranial vascular
hyperdensity.
IMPRESSION: Stable, normal noncontrast CT appearance of the brain.

## 2010-08-03 ENCOUNTER — Emergency Department (HOSPITAL_COMMUNITY): Admission: EM | Admit: 2010-08-03 | Discharge: 2010-08-03 | Payer: Self-pay | Admitting: Emergency Medicine

## 2010-08-15 IMAGING — CR DG CHEST 1V PORT
1 series · 1 of 1 positions shown · non-contrast
Comparison: 11/15/2009

CLINICAL DATA: Nausea and vomiting with diarrhea.

CHEST - 1 VIEW

[AP]
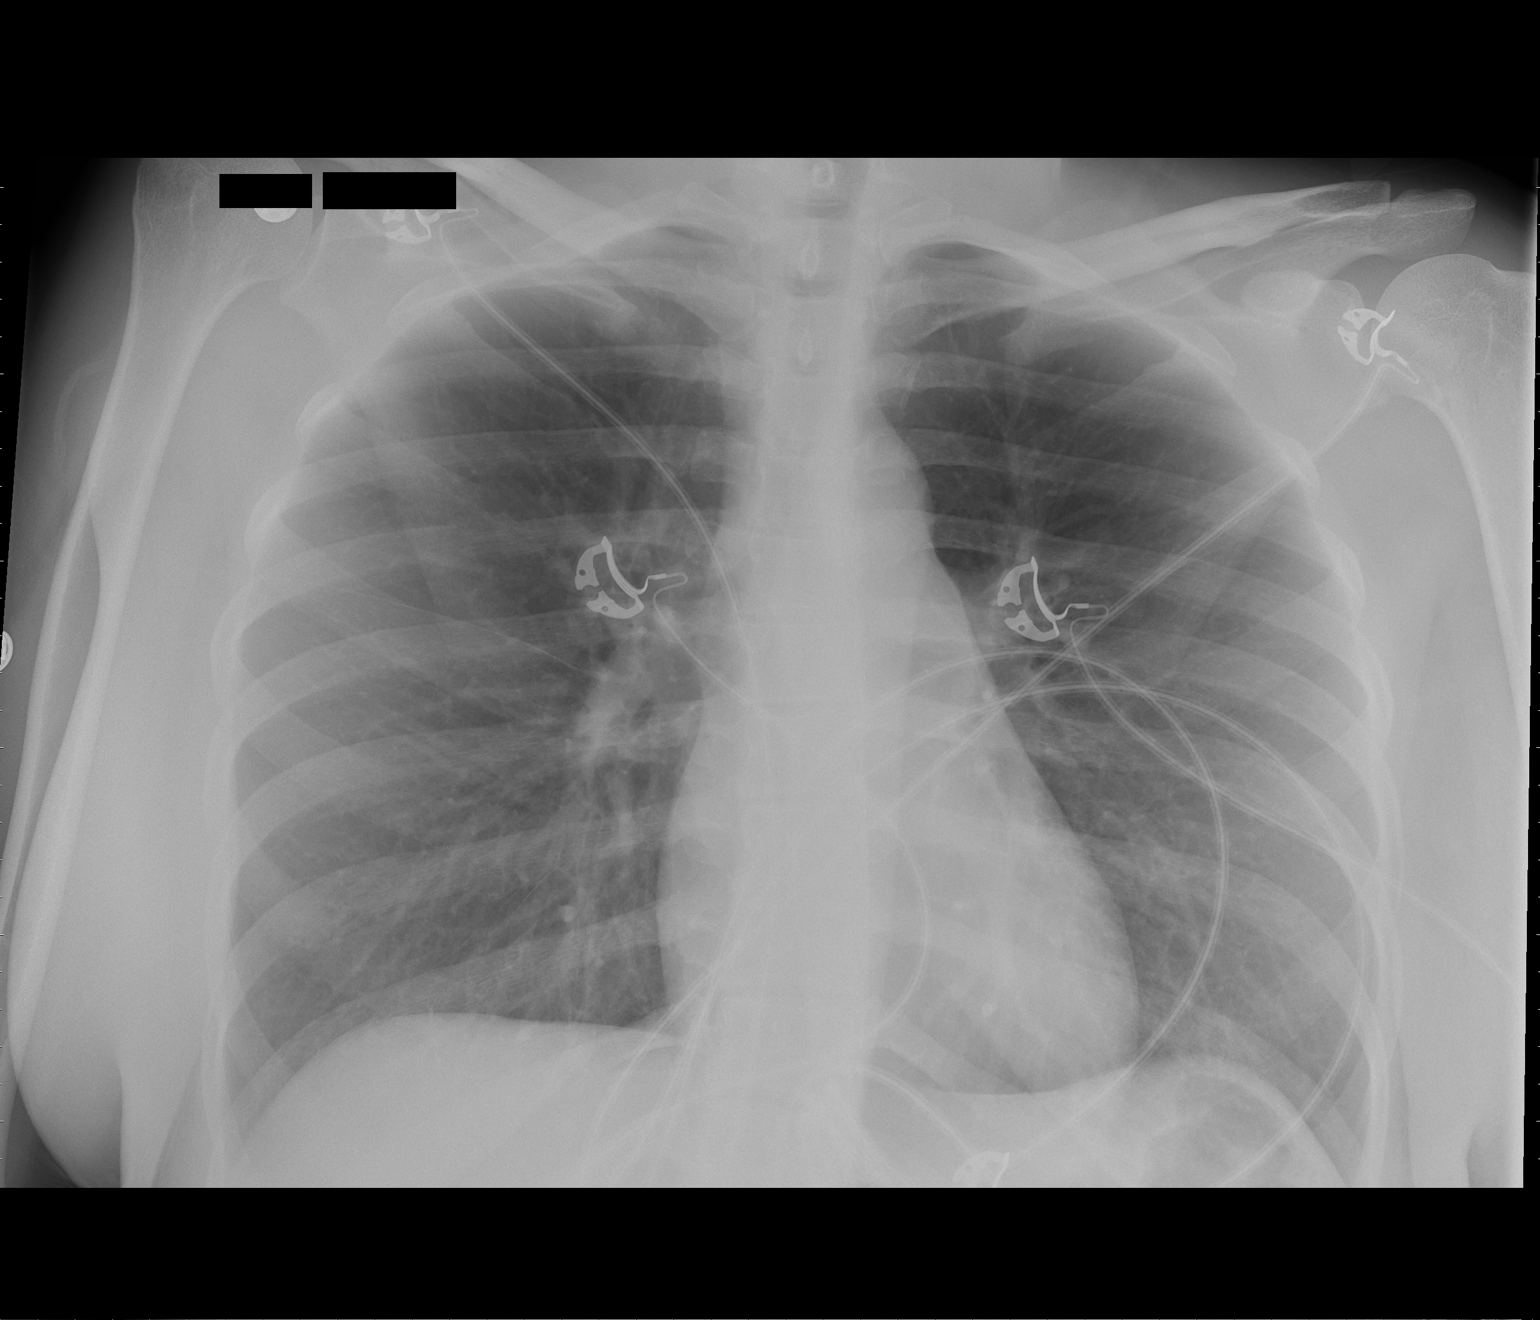

[1 of 1 positions shown; findings below may reference images not displayed]

FINDINGS: The heart size and mediastinal contours are within normal
limits.  Both lungs are clear.  No change from priors.
IMPRESSION: No active disease.

## 2010-08-15 IMAGING — CT CT HEAD W/O CM
1 of 2 series · 13 of 30 positions shown, 17 images · non-contrast
Comparison: 11/24/2009

CLINICAL DATA: Chest pain, nausea, vomiting, diarrhea, seizures,
slurred speech

CT HEAD WITHOUT CONTRAST
TECHNIQUE: Contiguous axial images were obtained from the base of
the skull through the vertex without contrast

[Series 2: brain · axial · 0.47mm/px · z∈[+116,+239]mm · 13 of 28 slices shown, 17 images]
[im 2/28  brain]
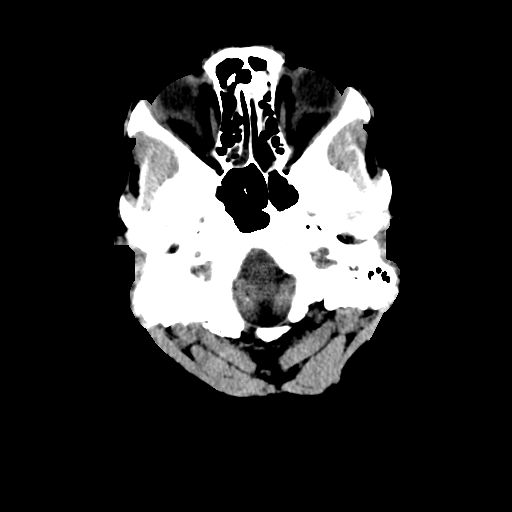
[im 2/28  bone]
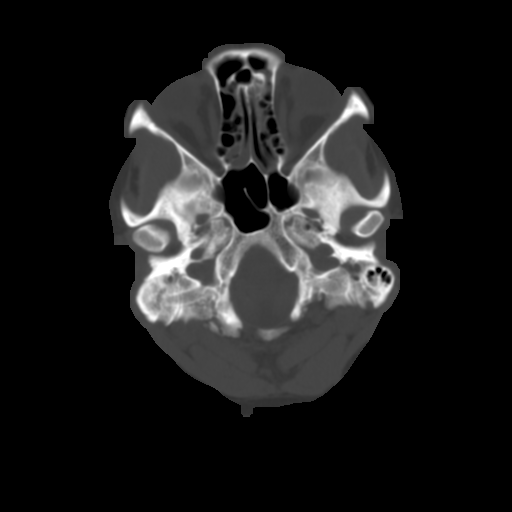
[im 4/28  brain]
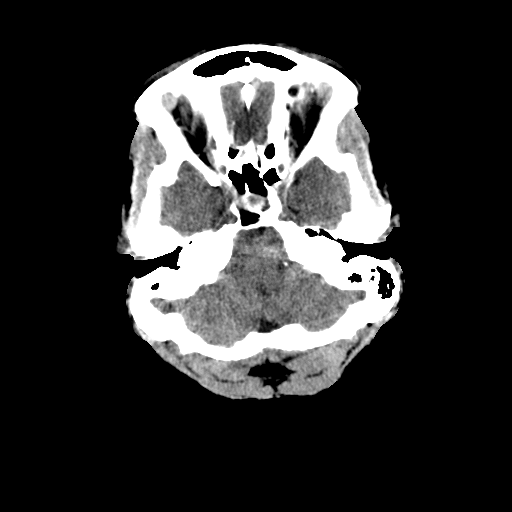
[im 6/28  brain]
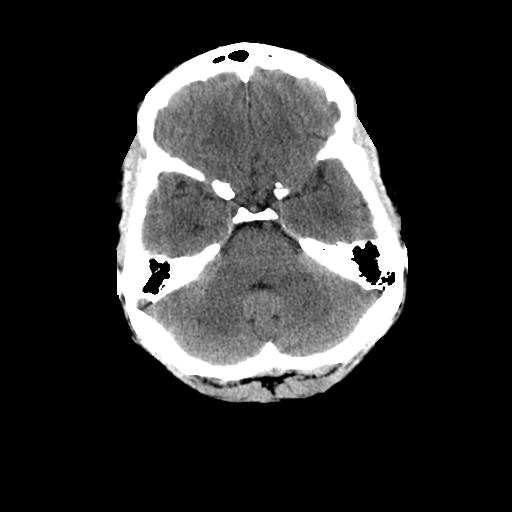
[im 8/28  brain]
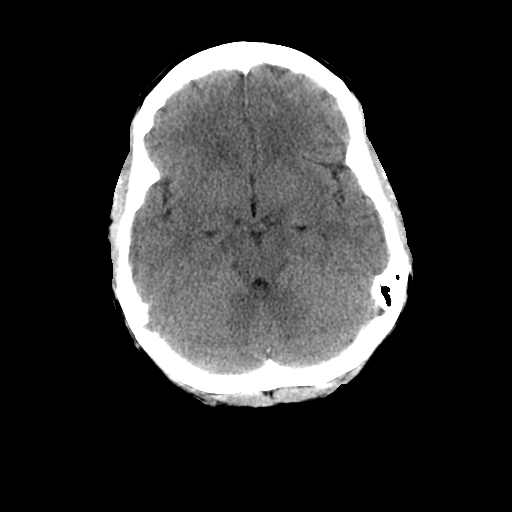
[im 10/28  brain]
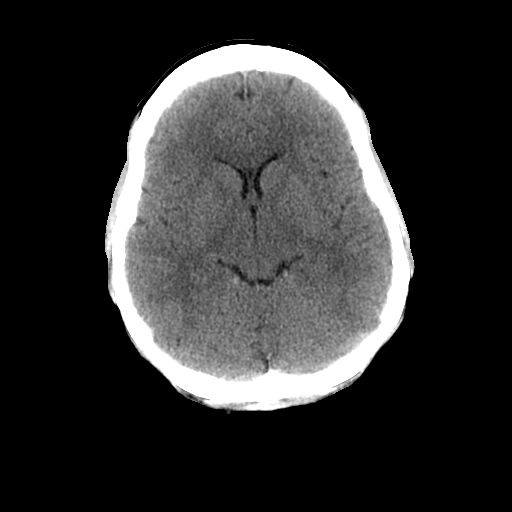
[im 10/28  bone]
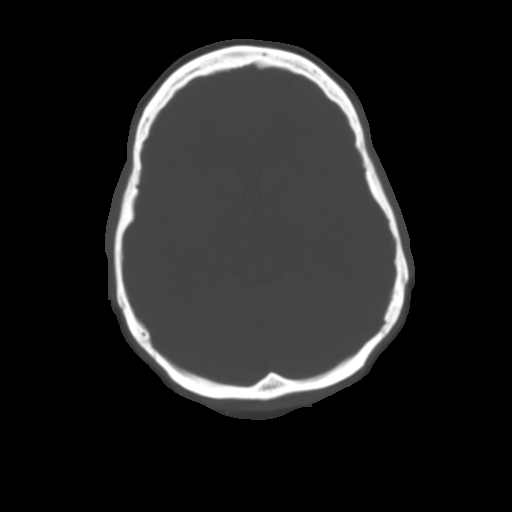
[im 12/28  brain]
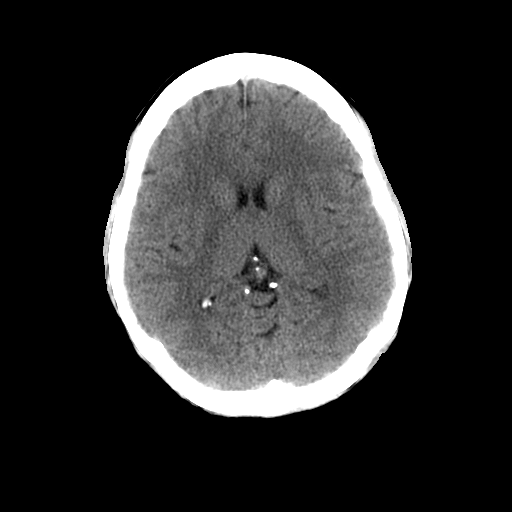
[im 14/28  brain]
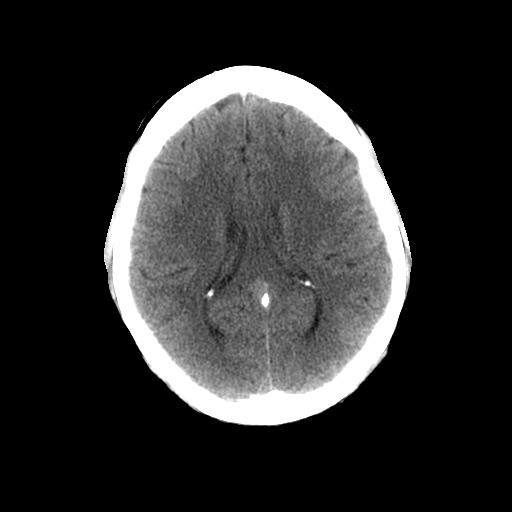
[im 16/28  brain]
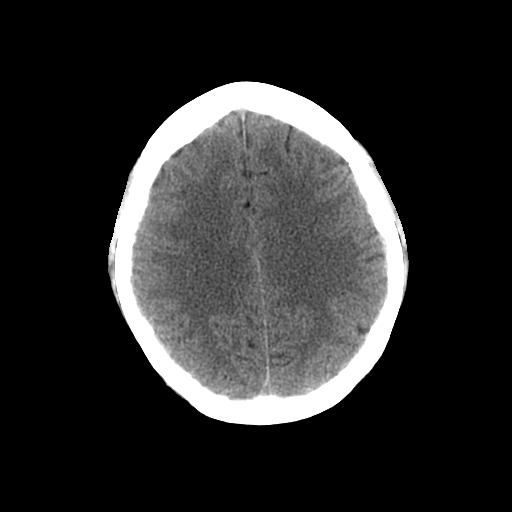
[im 18/28  brain]
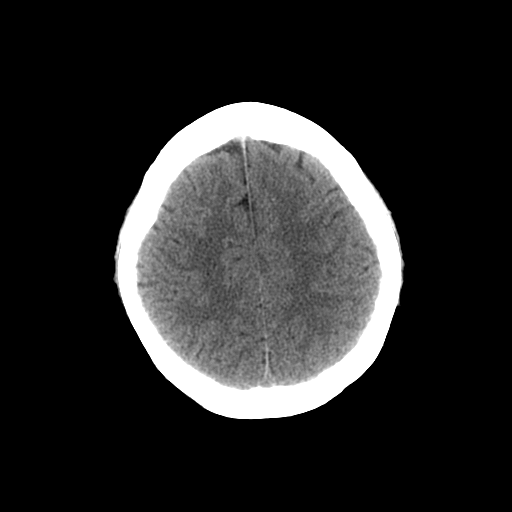
[im 18/28  bone]
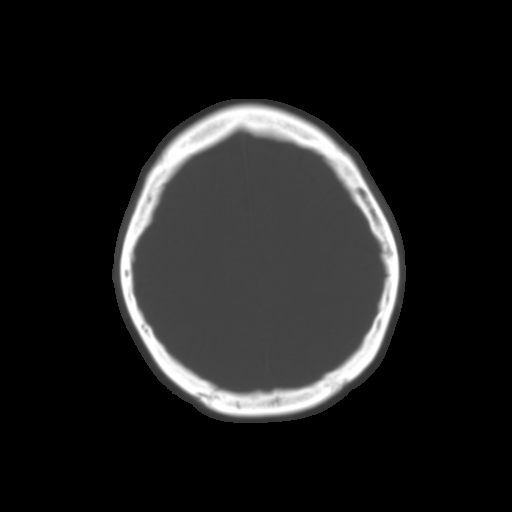
[im 20/28  brain]
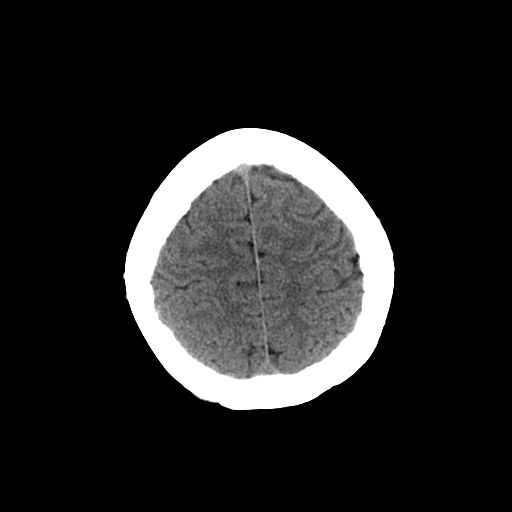
[im 22/28  brain]
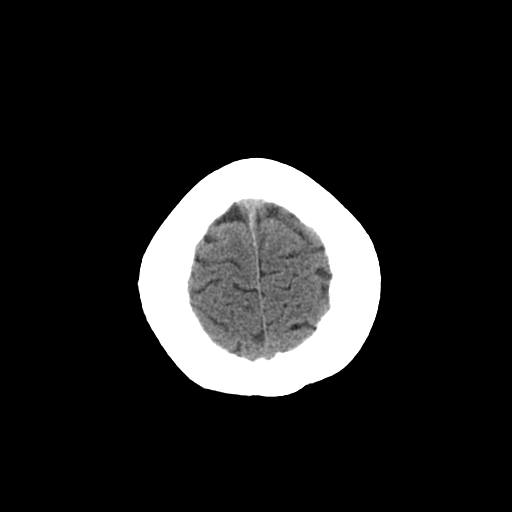
[im 24/28  brain]
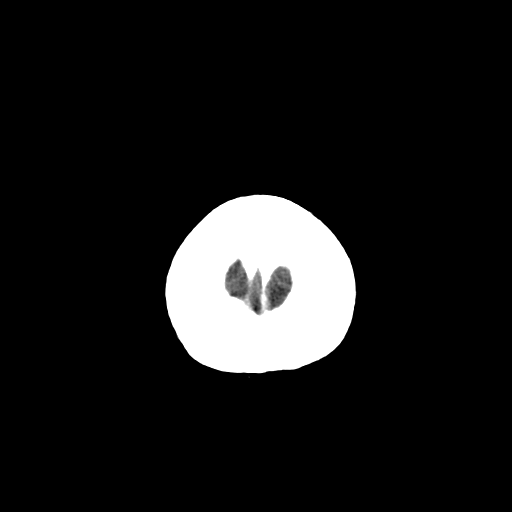
[im 26/28  brain]
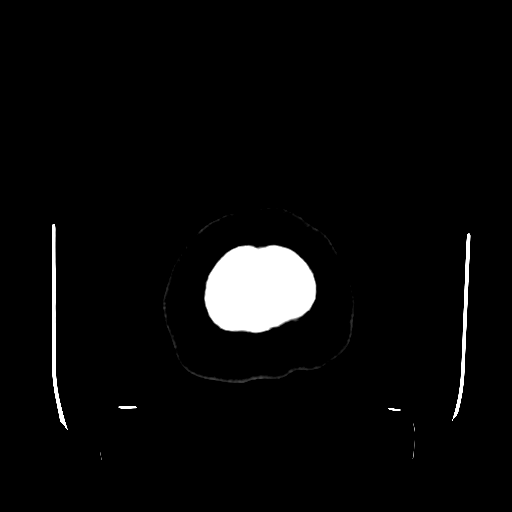
[im 26/28  bone]
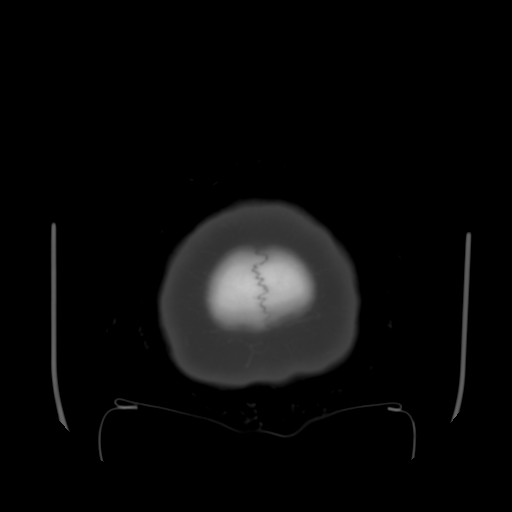

[13 of 30 positions shown; findings below may reference images not displayed]

FINDINGS: The brain has a normal appearance without evidence for
hemorrhage, acute infarction, hydrocephalus, or mass lesion.  There
is no extra axial fluid collection.  The skull and paranasal
sinuses are normal.
IMPRESSION: Normal CT of the head without contrast.

## 2010-08-16 IMAGING — CT CT ANGIO CHEST
2 of 6 series · 19 of 36 positions shown · IV contrast (APPLIED)
Comparison: 12/17/2009

CLINICAL DATA: Chest pain, headache.  Shortness of breath for 3
days.  No fever or cough.

CT ANGIOGRAPHY CHEST WITH CONTRAST
TECHNIQUE: Multidetector CT imaging of the chest was performed
using the standard protocol during bolus administration of
intravenous contrast.  Multiplanar CT image reconstructions
including MIPs were obtained to evaluate the vascular anatomy.
Contrast:  80 ml [id number]

[Series 8: pulm embolism 1.0 b25f thins · axial · 0.63mm/px · z∈[-372,-158]mm · 18 of 237 slices shown]
[im 12/237  lung]
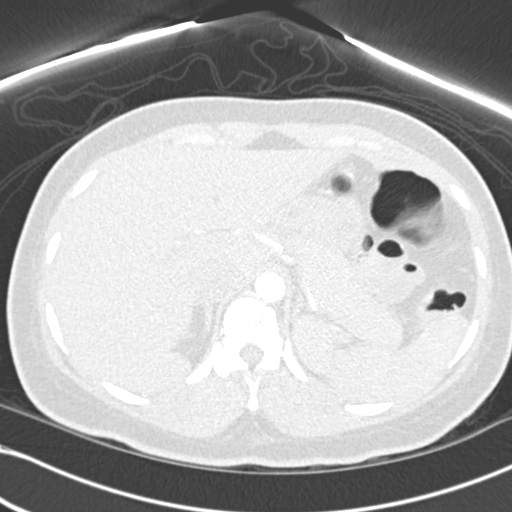
[im 24/237  mediastinal]
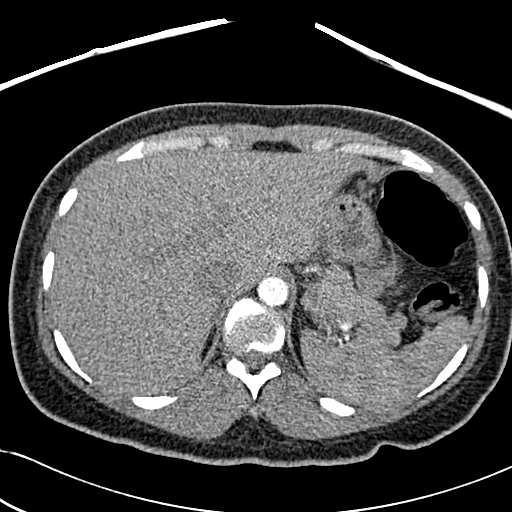
[im 36/237  lung]
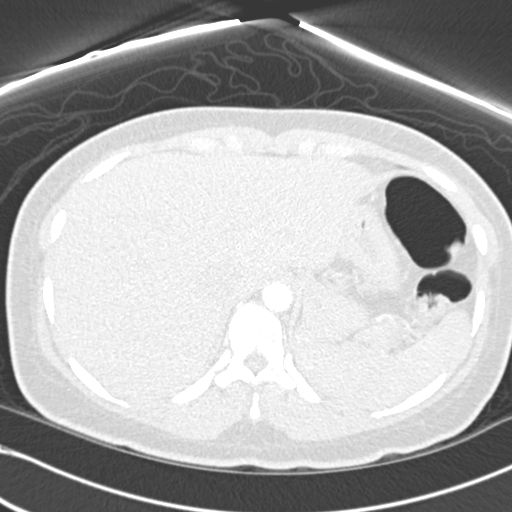
[im 48/237  mediastinal]
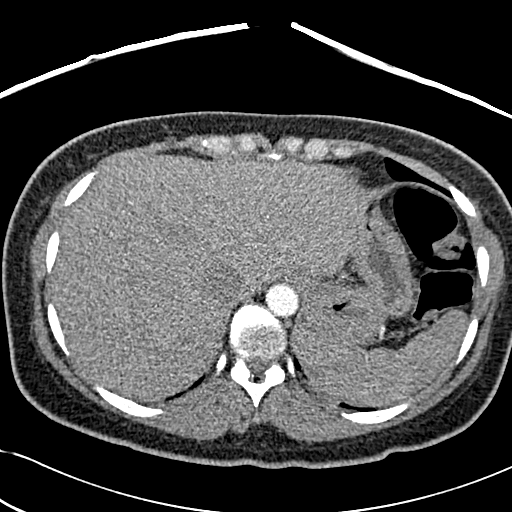
[im 60/237  lung]
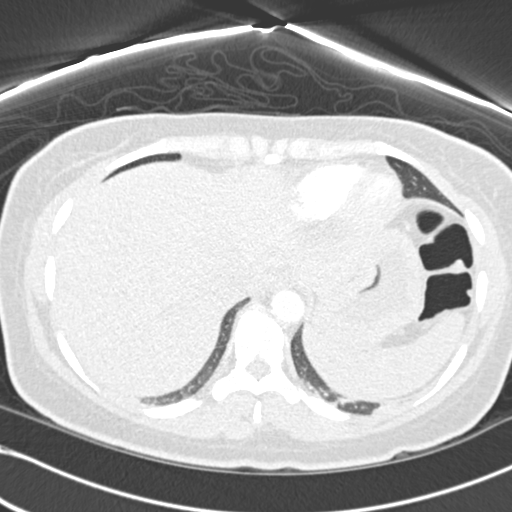
[im 71/237  mediastinal]
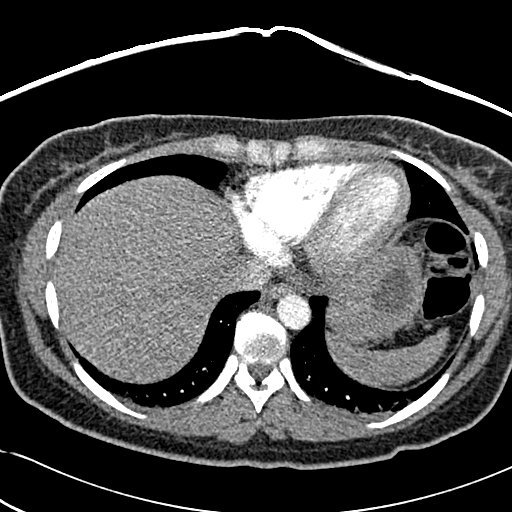
[im 83/237  lung]
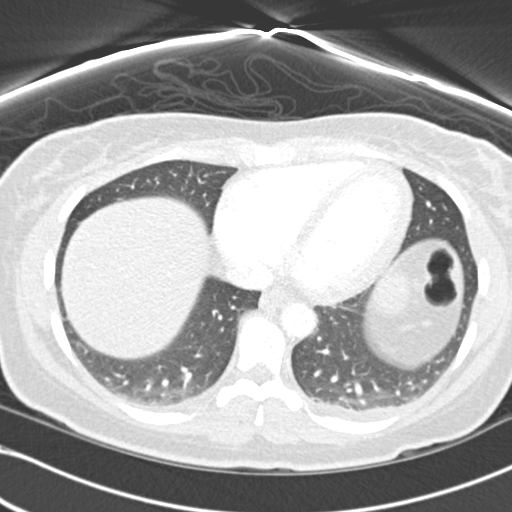
[im 95/237  mediastinal]
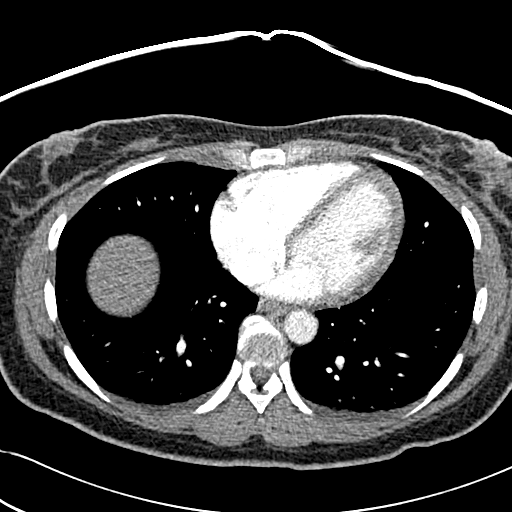
[im 107/237  lung]
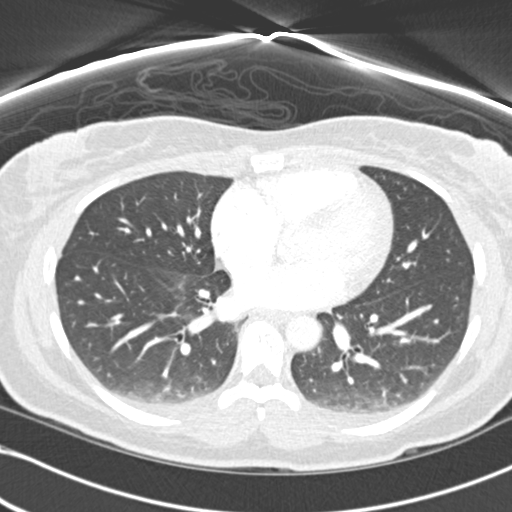
[im 130/237  mediastinal]
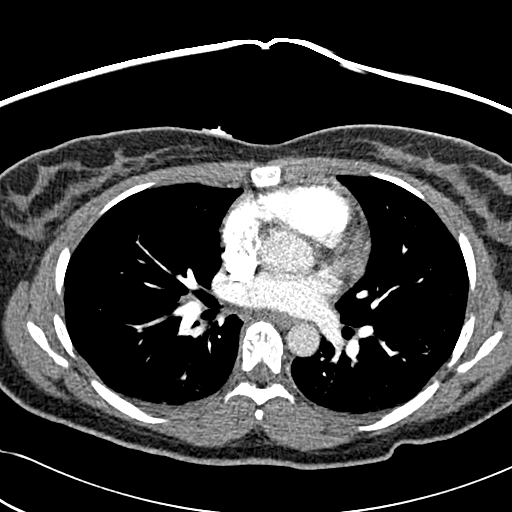
[im 142/237  lung]
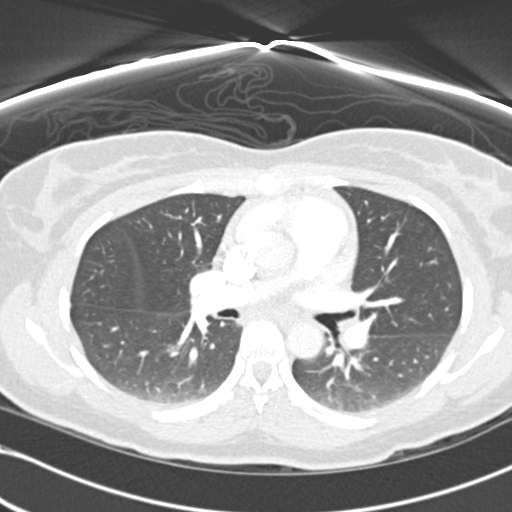
[im 154/237  mediastinal]
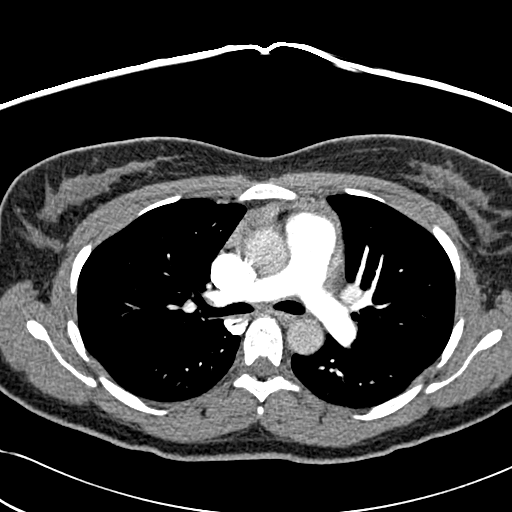
[im 166/237  lung]
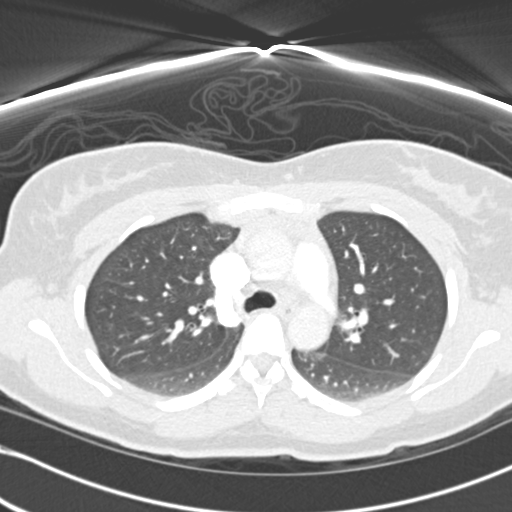
[im 178/237  mediastinal]
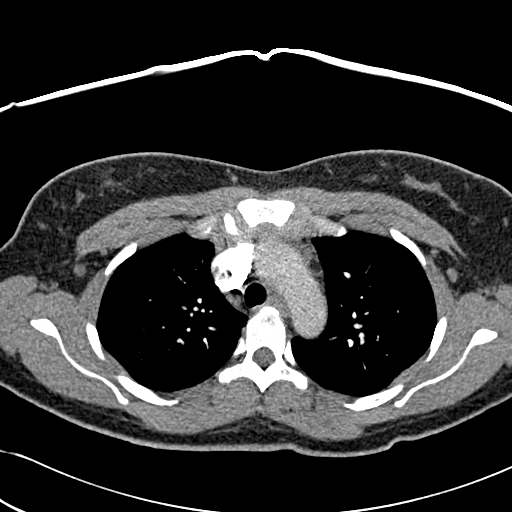
[im 189/237  lung]
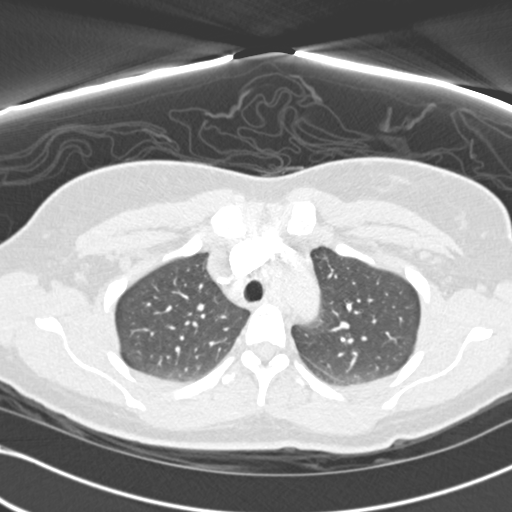
[im 201/237  mediastinal]
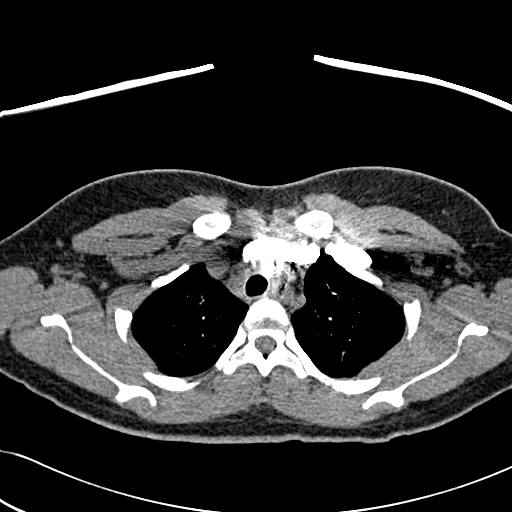
[im 213/237  lung]
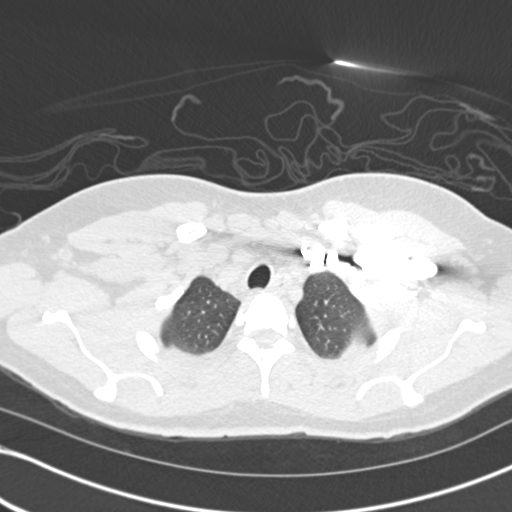
[im 225/237  mediastinal]
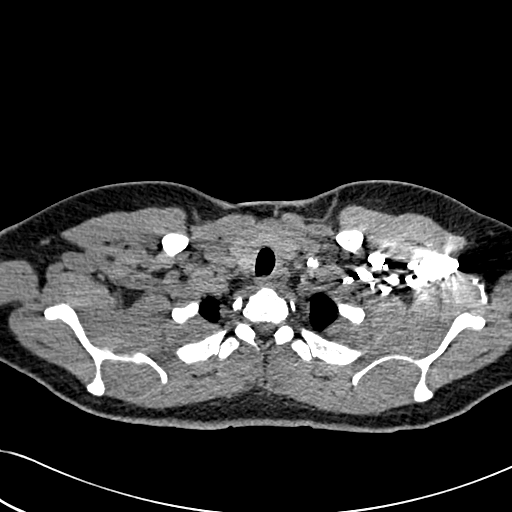

[Series 603: cor · coronal · 0.63mm/px · 1 of 93 slices shown]
[im 47/93  mediastinal]
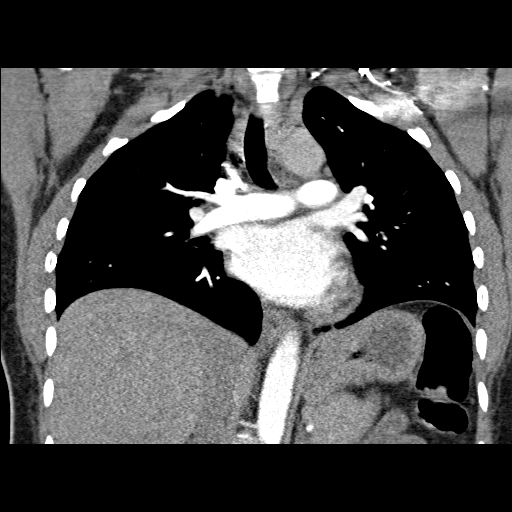

[19 of 36 positions shown; findings below may reference images not displayed]

FINDINGS: Vessels are well opacified.  There is no evidence for
pulmonary embolus.  There is no mediastinal, hilar, or axillary
adenopathy.  The thyroid gland has a normal appearance.  Images of
the upper abdomen are unremarkable.  Bony structures including the
sternum have a normal appearance.

Review of the MIP images confirms the above findings.
IMPRESSION: Technically adequate exam showing no evidence for acute pulmonary
embolus.

## 2010-08-31 ENCOUNTER — Emergency Department (HOSPITAL_COMMUNITY): Admission: EM | Admit: 2010-08-31 | Discharge: 2010-08-31 | Payer: Self-pay | Admitting: Emergency Medicine

## 2010-09-07 ENCOUNTER — Emergency Department (HOSPITAL_COMMUNITY): Admission: EM | Admit: 2010-09-07 | Discharge: 2010-09-07 | Payer: Self-pay | Admitting: Emergency Medicine

## 2010-09-27 ENCOUNTER — Emergency Department (HOSPITAL_COMMUNITY): Admission: EM | Admit: 2010-09-27 | Discharge: 2010-09-28 | Payer: Self-pay | Admitting: Emergency Medicine

## 2010-10-14 ENCOUNTER — Emergency Department (HOSPITAL_COMMUNITY): Admission: EM | Admit: 2010-10-14 | Discharge: 2010-10-14 | Payer: Self-pay | Admitting: Emergency Medicine

## 2010-10-25 ENCOUNTER — Emergency Department (HOSPITAL_COMMUNITY): Admission: EM | Admit: 2010-10-25 | Discharge: 2010-10-26 | Payer: Self-pay | Admitting: Emergency Medicine

## 2010-11-06 ENCOUNTER — Emergency Department (HOSPITAL_COMMUNITY): Admission: EM | Admit: 2010-11-06 | Discharge: 2010-11-06 | Payer: Self-pay | Admitting: Emergency Medicine

## 2010-11-10 IMAGING — CT CT HEAD W/O CM
1 series · 12 of 14 positions shown, 15 images · non-contrast
Comparison: 12/18/2009

CLINICAL DATA: Seizure.  Headache.  Weakness.  Unresponsive
patient.

CT HEAD WITHOUT CONTRAST
TECHNIQUE: Contiguous axial images were obtained from the base of
the skull through the vertex without contrast.

[Series 2: head_seq 4.5 h37s st · axial · 0.43mm/px · z∈[-164,-47]mm · 12 of 32 slices shown, 15 images]
[im 3/32  soft-tissue]
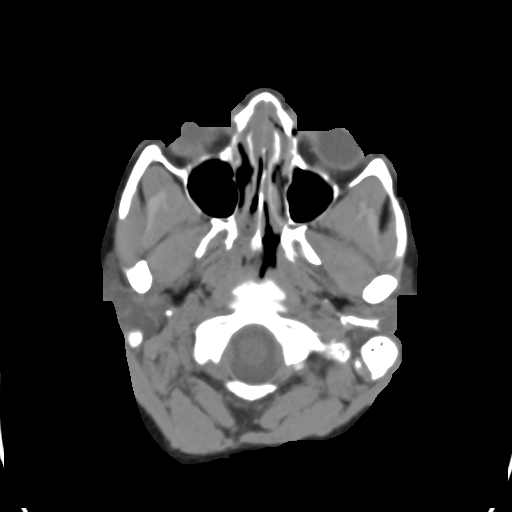
[im 3/32  bone]
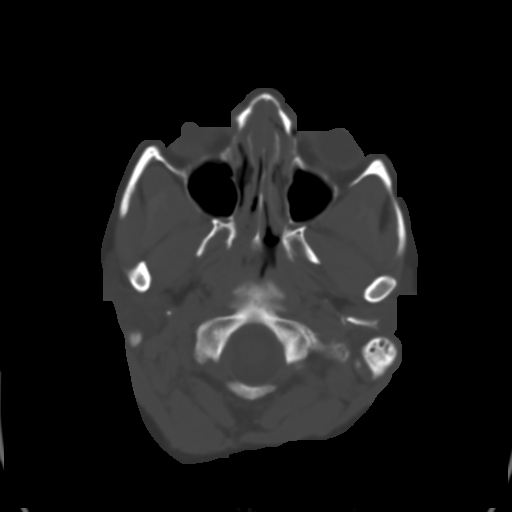
[im 5/32  bone]
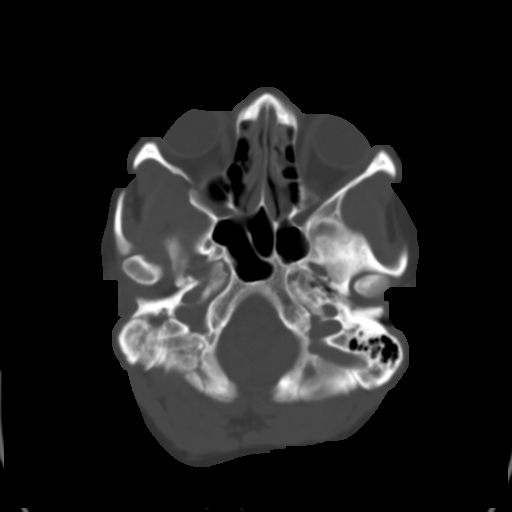
[im 8/32  bone]
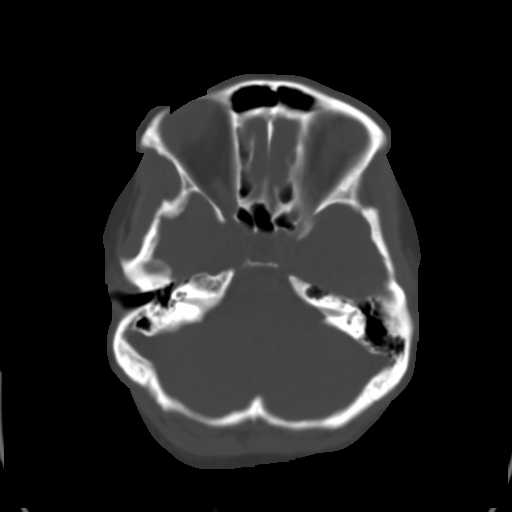
[im 10/32  bone]
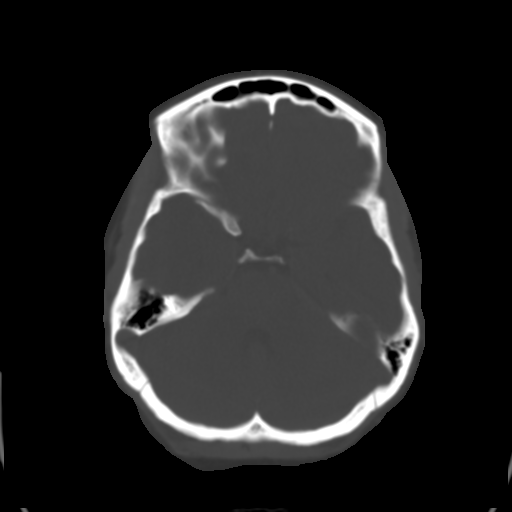
[im 12/32  soft-tissue]
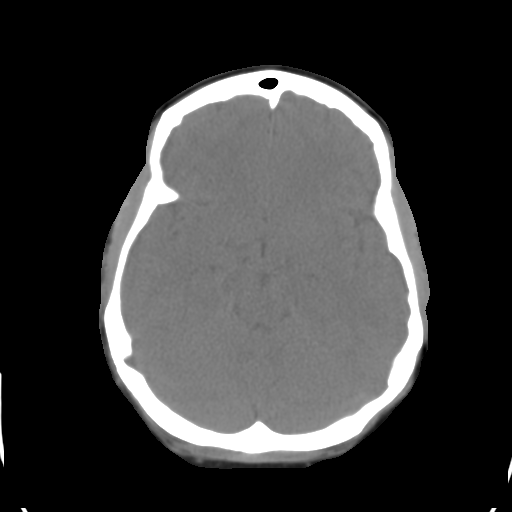
[im 12/32  bone]
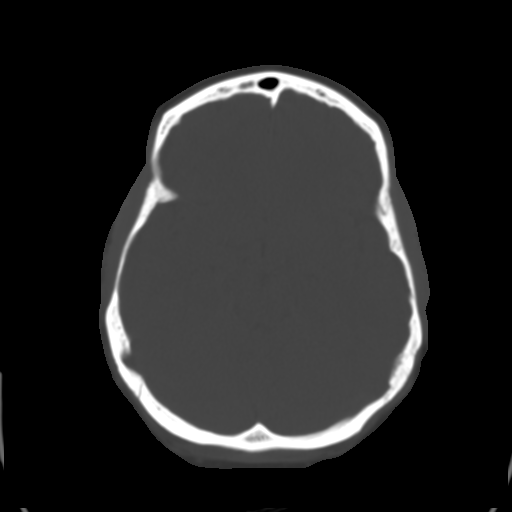
[im 15/32  bone]
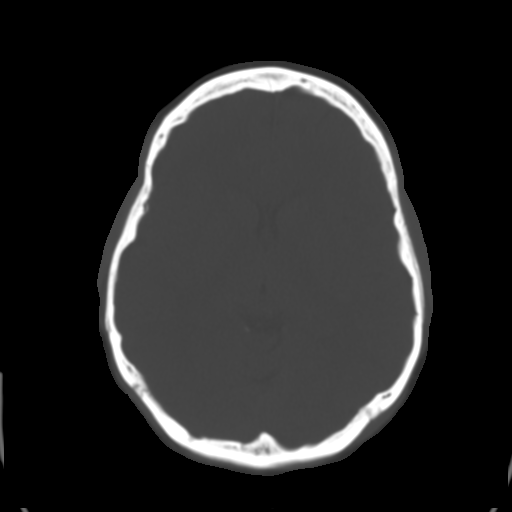
[im 17/32  bone]
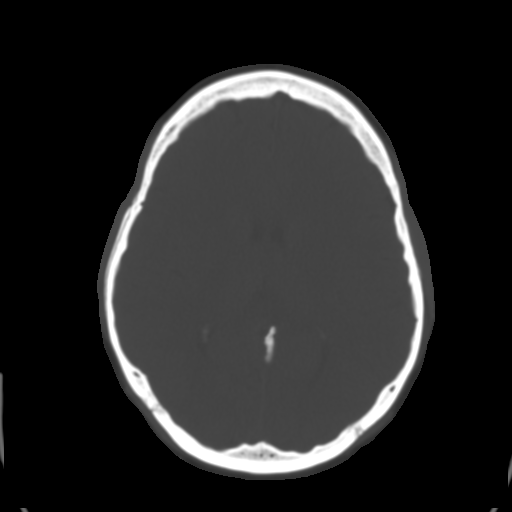
[im 20/32  bone]
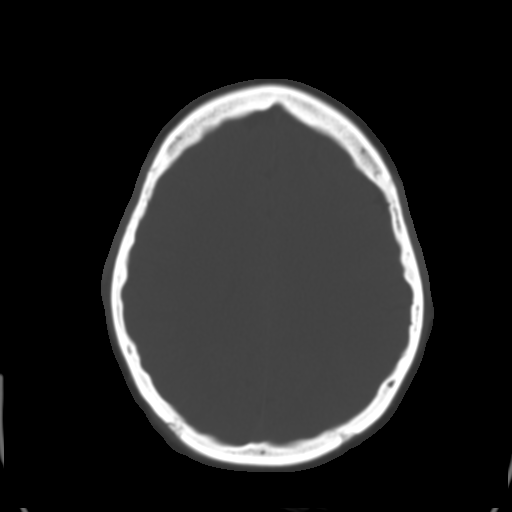
[im 22/32  soft-tissue]
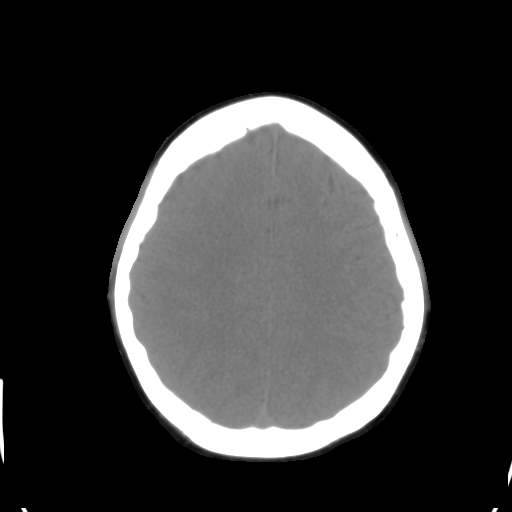
[im 22/32  bone]
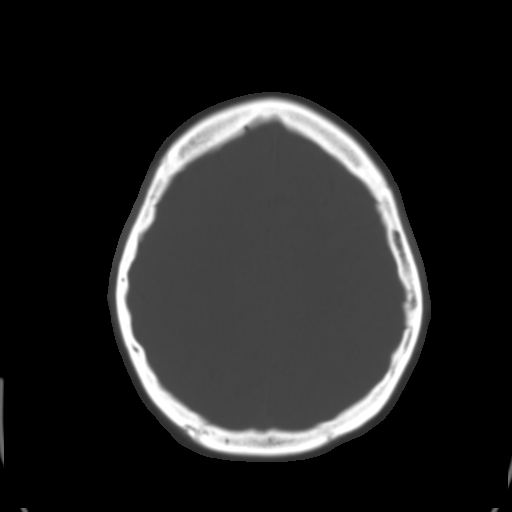
[im 24/32  bone]
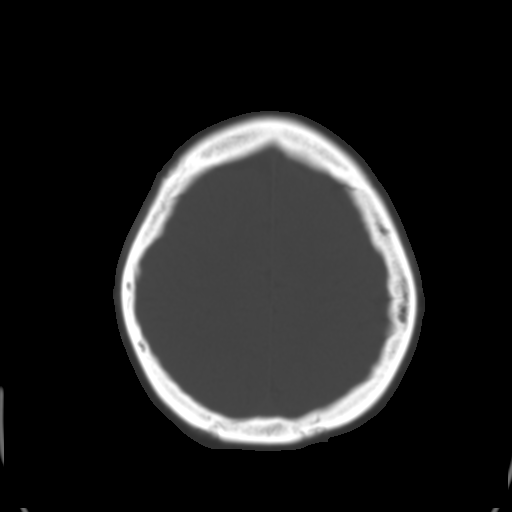
[im 27/32  bone]
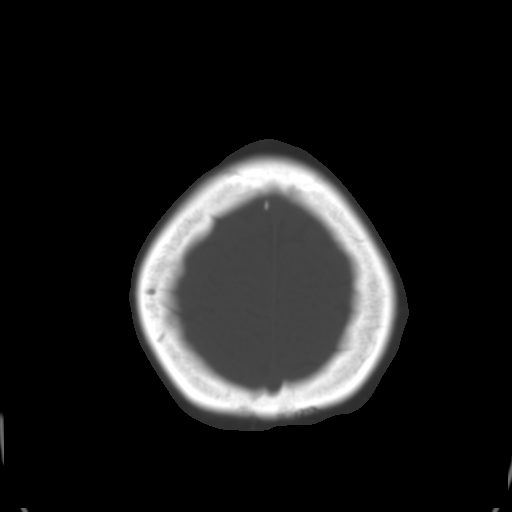
[im 29/32  bone]
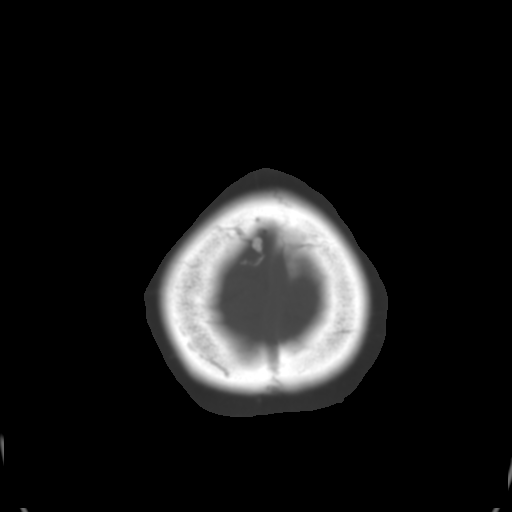

[12 of 14 positions shown; findings below may reference images not displayed]

FINDINGS: The brain stem, cerebellum, cerebral peduncles, thalami,
basal ganglia, basilar cisterns, and ventricular system appear
unremarkable.

No intracranial hemorrhage, mass lesion, or acute infarction is
identified.

Nasal cavity mucosal swelling noted along with chronic ethmoid and
left frontal sinusitis.
IMPRESSION: 1.  Chronic ethmoid sinusitis with mucosal swelling in the nasal
cavity.
2.   Otherwise, no significant abnormality identified.

## 2010-11-12 IMAGING — MR MR HEAD W/O CM
6 of 9 series · 29 of 48 positions shown · non-contrast
Comparison: MRI brain most recent 12/18/2009.  CT head 03/14/2010.

CLINICAL DATA: Seizure, total body weakness.

MRI HEAD WITHOUT CONTRAST
TECHNIQUE: Multiplanar, multiecho pulse sequences of the brain and
surrounding structures were obtained according to standard protocol
without intravenous contrast.

[Series 4: DWI · axial · 5.0mm · 1.09mm/px · z∈[-78,+66]mm · 9 of 60 slices shown (1 of 2)]
[im 1/60]
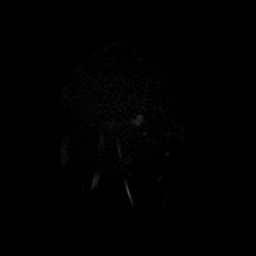
[im 8/60]
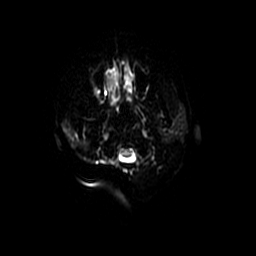
[im 15/60]
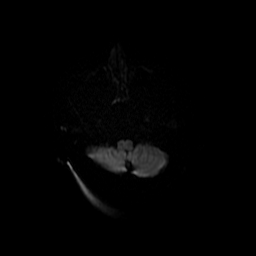
[im 23/60]
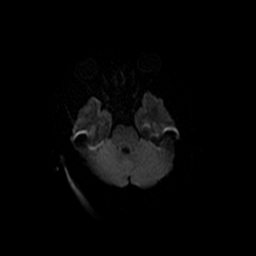
[im 30/60]
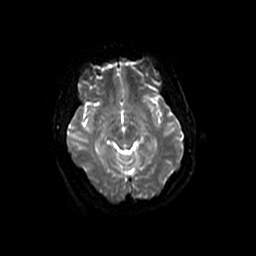
[im 37/60]
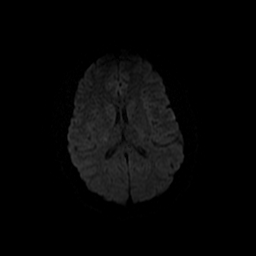
[im 45/60]
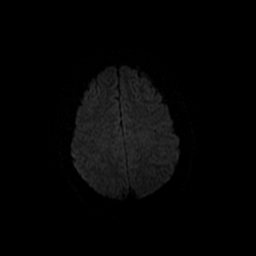
[im 52/60]
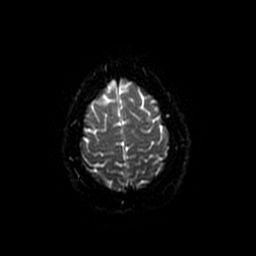
[im 60/60]
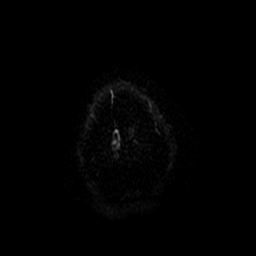

[Series 5: T2 · axial · 5.0mm · 0.43mm/px · z∈[-80,+61]mm · 4 of 23 slices shown (1 of 3)]
[im 1/23]
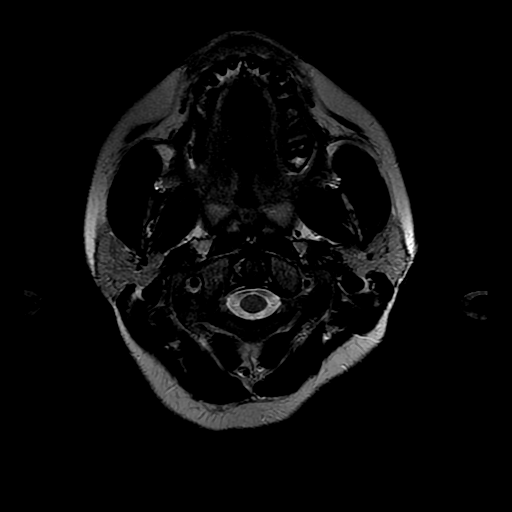
[im 8/23]
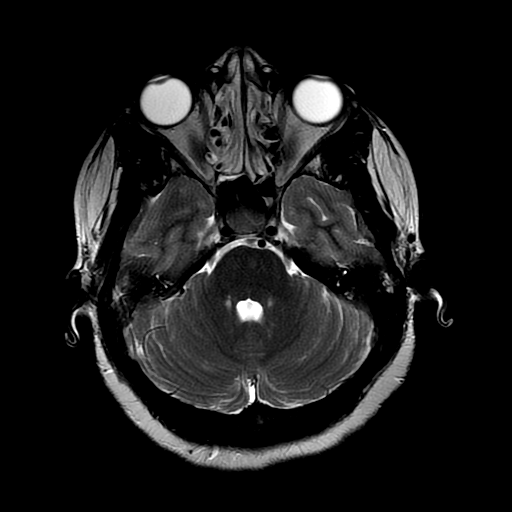
[im 15/23]
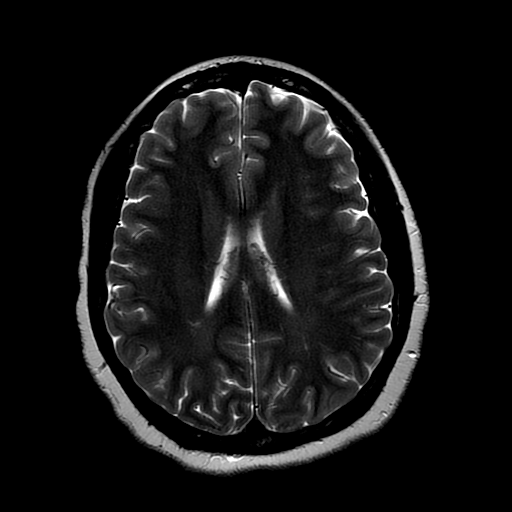
[im 23/23]
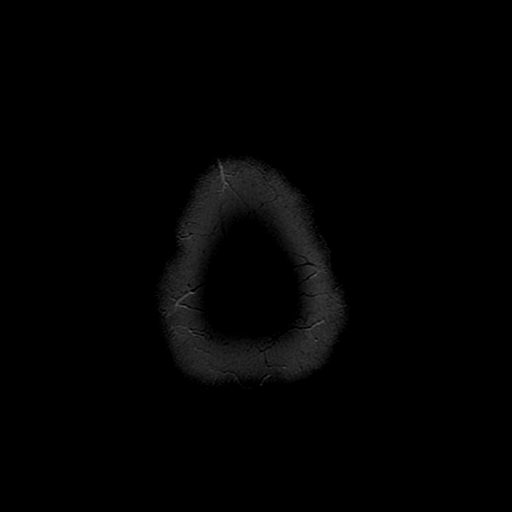

[Series 6: FLAIR · axial · 5.0mm · 0.43mm/px · z∈[-85,+67]mm · 4 of 23 slices shown]
[im 1/23]
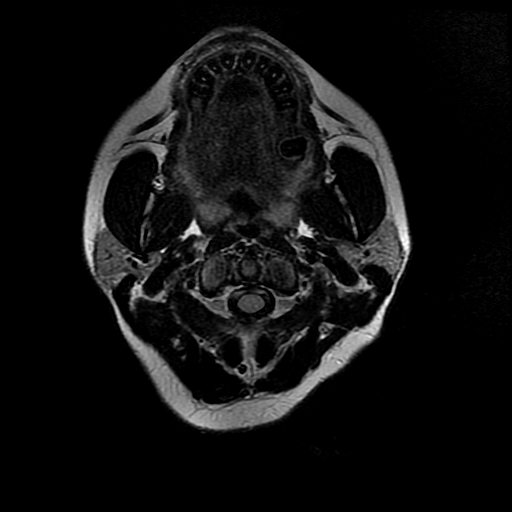
[im 8/23]
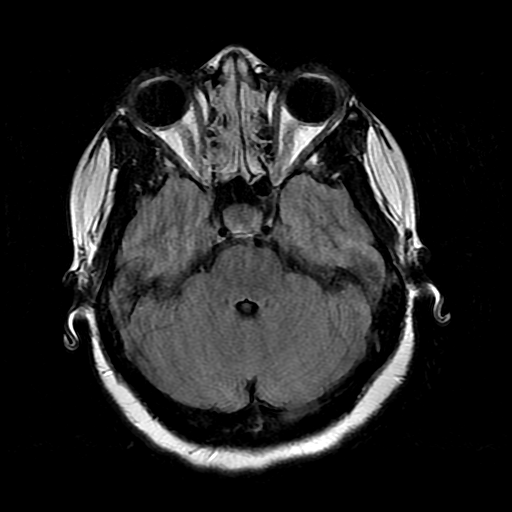
[im 15/23]
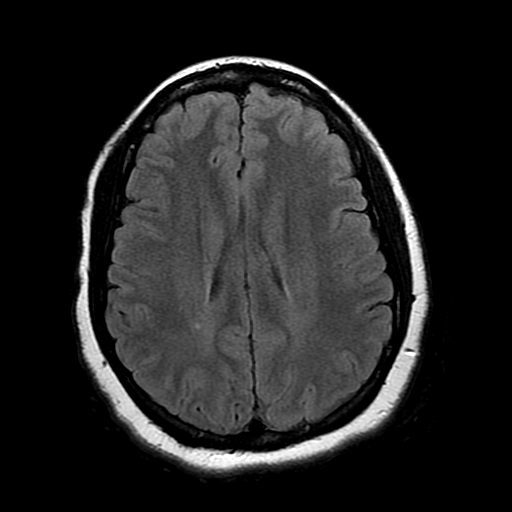
[im 23/23]
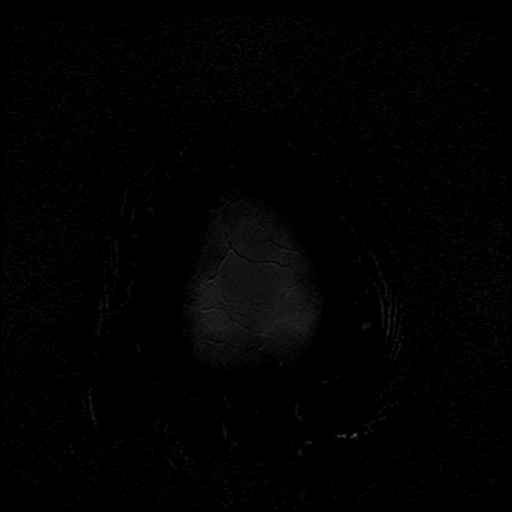

[Series 10: T2 · coronal · 5.0mm · 0.45mm/px · 4 of 24 slices shown (2 of 3)]
[im 1/24]
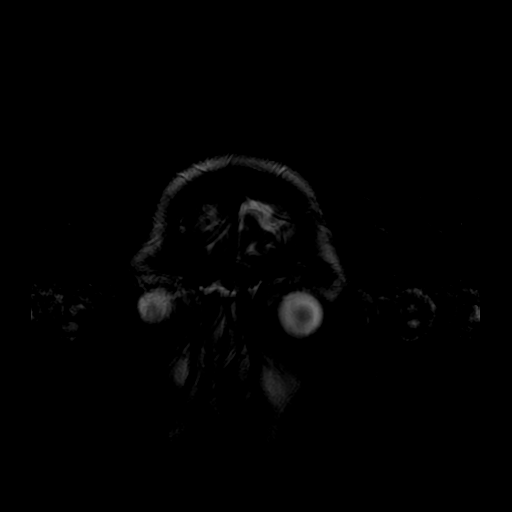
[im 8/24]
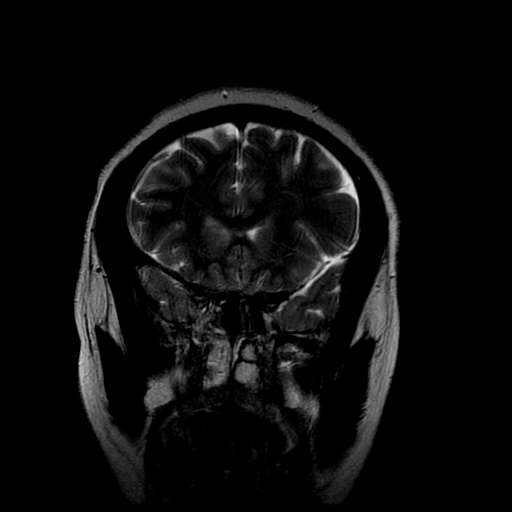
[im 16/24]
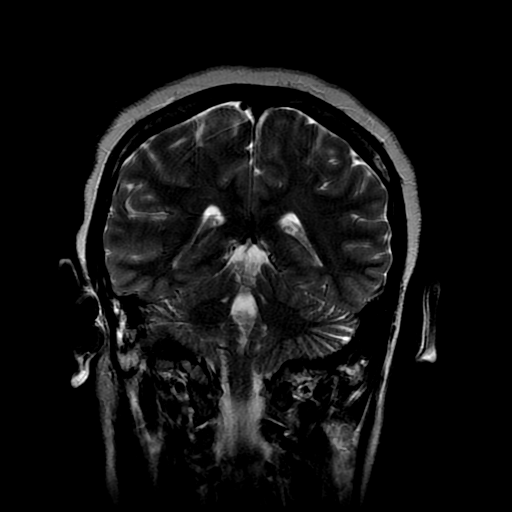
[im 24/24]
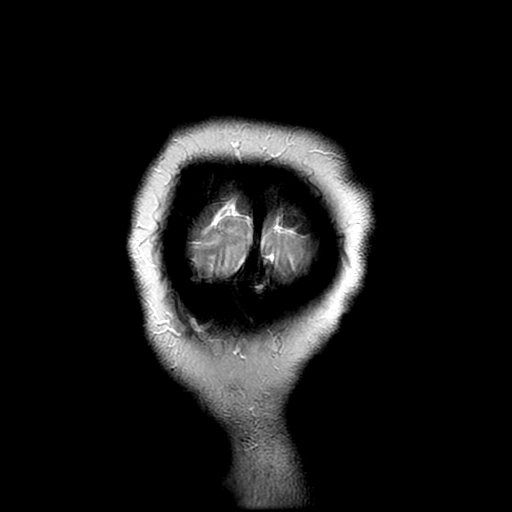

[Series 11: T2 · coronal · 3.0mm · 0.39mm/px · 3 of 28 slices shown (3 of 3)]
[im 1/28]
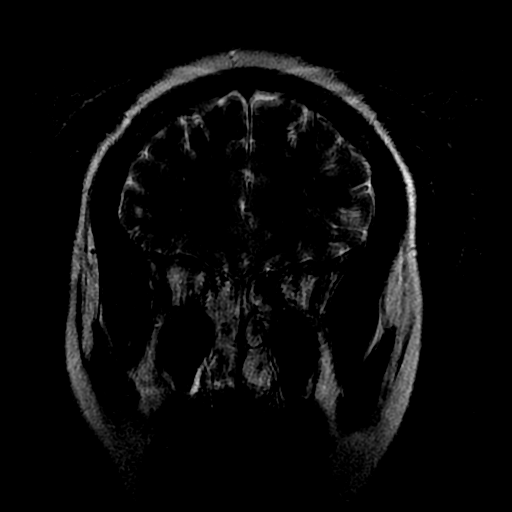
[im 7/28]
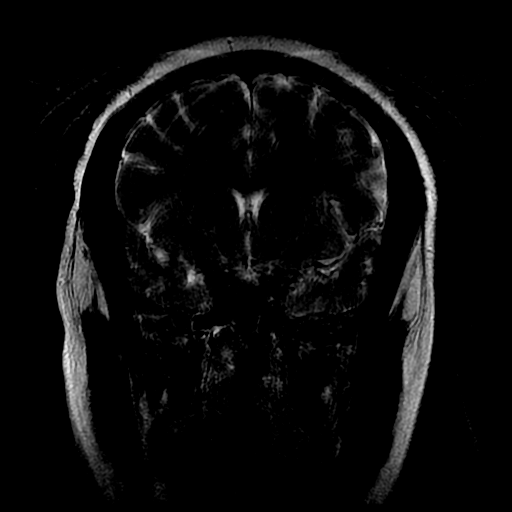
[im 14/28]
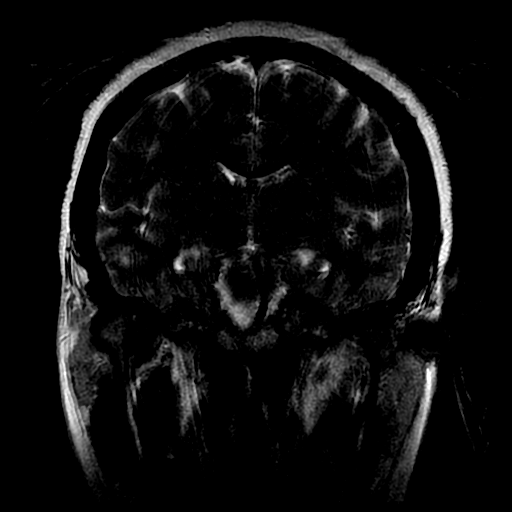

[Series 400: DWI · axial · 5.0mm · 1.09mm/px · z∈[-78,+66]mm · 5 of 30 slices shown (2 of 2)]
[im 1/30]
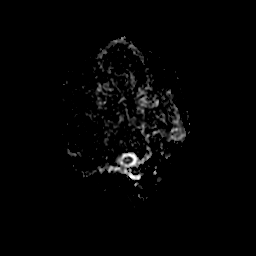
[im 8/30]
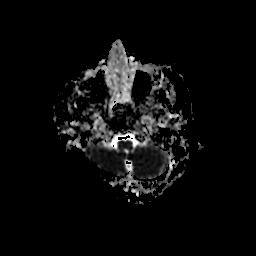
[im 15/30]
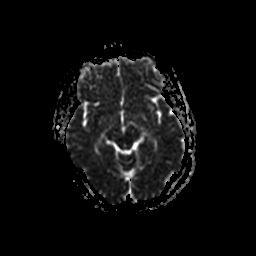
[im 22/30]
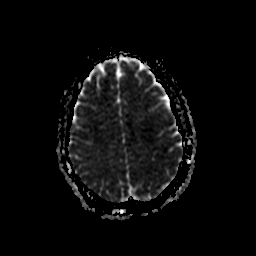
[im 30/30]
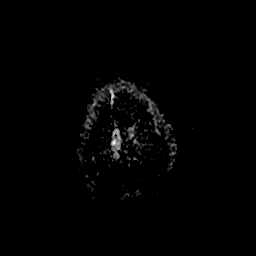

[29 of 48 positions shown; findings below may reference images not displayed]

FINDINGS: Mild motion degradation.  Small or subtle lesions could
be overlooked.

There is no evidence for acute infarction, intracranial hemorrhage,
mass lesion, hydrocephalus, or extra-axial fluid. There is no
atrophy.  The patient may have developed minimal white matter
disease since the prior MR study, but it is difficult to be
certain, since that scan too was degraded by motion. The major
intracranial vascular structures are patent.  Advanced sinus
disease can be seen in the ethmoids.  This was noted on prior CT.
Thin section coronal imaging through the temporal lobes is degraded
by motion, particularly on the high-resolution series, but shows no
obvious asymmetry or mass.
IMPRESSION: Motion degraded images; see comments above.

No acute stroke or hemorrhage.

No intracranial mass or inflammation is observed which might serve
as a seizure focus.

## 2010-11-17 ENCOUNTER — Observation Stay (HOSPITAL_COMMUNITY): Admission: EM | Admit: 2010-11-17 | Discharge: 2010-03-18 | Payer: Self-pay | Admitting: Emergency Medicine

## 2010-11-17 ENCOUNTER — Emergency Department (HOSPITAL_COMMUNITY): Admission: EM | Admit: 2010-11-17 | Discharge: 2010-06-23 | Payer: Self-pay | Admitting: Emergency Medicine

## 2010-11-30 ENCOUNTER — Emergency Department (HOSPITAL_COMMUNITY)
Admission: EM | Admit: 2010-11-30 | Discharge: 2010-11-30 | Payer: Self-pay | Source: Home / Self Care | Admitting: Emergency Medicine

## 2010-12-18 ENCOUNTER — Emergency Department (HOSPITAL_COMMUNITY)
Admission: EM | Admit: 2010-12-18 | Discharge: 2010-12-19 | Payer: Self-pay | Source: Home / Self Care | Admitting: Emergency Medicine

## 2010-12-26 LAB — POCT I-STAT, CHEM 8
BUN: 10 mg/dL (ref 6–23)
Calcium, Ion: 1.06 mmol/L — ABNORMAL LOW (ref 1.12–1.32)
Chloride: 105 mEq/L (ref 96–112)
Creatinine, Ser: 0.9 mg/dL (ref 0.4–1.2)
Glucose, Bld: 82 mg/dL (ref 70–99)
HCT: 31 % — ABNORMAL LOW (ref 36.0–46.0)
Hemoglobin: 10.5 g/dL — ABNORMAL LOW (ref 12.0–15.0)
Potassium: 4.2 mEq/L (ref 3.5–5.1)
Sodium: 138 mEq/L (ref 135–145)
TCO2: 24 mmol/L (ref 0–100)

## 2010-12-26 LAB — URINALYSIS, ROUTINE W REFLEX MICROSCOPIC
Bilirubin Urine: NEGATIVE
Ketones, ur: NEGATIVE mg/dL
Nitrite: POSITIVE — AB
Protein, ur: 30 mg/dL — AB
Specific Gravity, Urine: 1.018 (ref 1.005–1.030)
Urine Glucose, Fasting: NEGATIVE mg/dL
Urobilinogen, UA: 1 mg/dL (ref 0.0–1.0)
pH: 7 (ref 5.0–8.0)

## 2010-12-26 LAB — CBC
HCT: 30.1 % — ABNORMAL LOW (ref 36.0–46.0)
Hemoglobin: 10.2 g/dL — ABNORMAL LOW (ref 12.0–15.0)
MCH: 28.5 pg (ref 26.0–34.0)
MCHC: 33.9 g/dL (ref 30.0–36.0)
MCV: 84.1 fL (ref 78.0–100.0)
Platelets: 246 10*3/uL (ref 150–400)
RBC: 3.58 MIL/uL — ABNORMAL LOW (ref 3.87–5.11)
RDW: 12.9 % (ref 11.5–15.5)
WBC: 6.2 10*3/uL (ref 4.0–10.5)

## 2010-12-26 LAB — URINE MICROSCOPIC-ADD ON

## 2010-12-26 LAB — DIFFERENTIAL
Basophils Absolute: 0.1 10*3/uL (ref 0.0–0.1)
Basophils Relative: 1 % (ref 0–1)
Eosinophils Absolute: 0.3 10*3/uL (ref 0.0–0.7)
Eosinophils Relative: 5 % (ref 0–5)
Lymphocytes Relative: 51 % — ABNORMAL HIGH (ref 12–46)
Lymphs Abs: 3.2 10*3/uL (ref 0.7–4.0)
Monocytes Absolute: 0.5 10*3/uL (ref 0.1–1.0)
Monocytes Relative: 8 % (ref 3–12)
Neutro Abs: 2.2 10*3/uL (ref 1.7–7.7)
Neutrophils Relative %: 35 % — ABNORMAL LOW (ref 43–77)

## 2010-12-26 LAB — PHENYTOIN LEVEL, TOTAL: Phenytoin Lvl: 12.7 ug/mL (ref 10.0–20.0)

## 2010-12-26 LAB — CARBAMAZEPINE LEVEL, TOTAL: Carbamazepine Lvl: 7.2 ug/mL (ref 4.0–12.0)

## 2010-12-26 LAB — POCT PREGNANCY, URINE: Preg Test, Ur: NEGATIVE

## 2011-01-01 ENCOUNTER — Encounter: Payer: Self-pay | Admitting: Cardiology

## 2011-01-11 ENCOUNTER — Emergency Department (HOSPITAL_COMMUNITY)
Admission: EM | Admit: 2011-01-11 | Discharge: 2011-01-11 | Disposition: A | Discharge status: Home / Self Care | Payer: BC Managed Care – PPO | Attending: Emergency Medicine | Admitting: Emergency Medicine

## 2011-01-11 ENCOUNTER — Emergency Department (HOSPITAL_COMMUNITY): Payer: BC Managed Care – PPO

## 2011-01-11 DIAGNOSIS — R0789 Other chest pain: Secondary | ICD-10-CM | POA: Insufficient documentation

## 2011-01-11 DIAGNOSIS — R0602 Shortness of breath: Secondary | ICD-10-CM | POA: Insufficient documentation

## 2011-01-11 DIAGNOSIS — R5381 Other malaise: Secondary | ICD-10-CM | POA: Insufficient documentation

## 2011-01-11 LAB — DIFFERENTIAL
Basophils Absolute: 0.1 10*3/uL (ref 0.0–0.1)
Eosinophils Absolute: 0.5 10*3/uL (ref 0.0–0.7)
Eosinophils Relative: 8 % — ABNORMAL HIGH (ref 0–5)
Lymphocytes Relative: 47 % — ABNORMAL HIGH (ref 12–46)
Monocytes Absolute: 0.4 10*3/uL (ref 0.1–1.0)

## 2011-01-11 LAB — BASIC METABOLIC PANEL
BUN: 6 mg/dL (ref 6–23)
Calcium: 8.9 mg/dL (ref 8.4–10.5)
GFR calc non Af Amer: >60 mL/min (ref >60)
Glucose, Bld: 89 mg/dL (ref 70–99)
Sodium: 140 mEq/L (ref 135–145)

## 2011-01-11 LAB — CBC
HCT: 31.3 % — ABNORMAL LOW (ref 36.0–46.0)
MCHC: 32.6 g/dL (ref 30.0–36.0)
MCV: 84.8 fL (ref 78.0–100.0)
Platelets: 248 10*3/uL (ref 150–400)
RDW: 13.4 % (ref 11.5–15.5)
WBC: 5.8 10*3/uL (ref 4.0–10.5)

## 2011-01-11 LAB — POCT CARDIAC MARKERS
CKMB, poc: <1 ng/mL — ABNORMAL LOW (ref 1.0–8.0)
Troponin i, poc: <0.05 ng/mL (ref 0.00–0.09)

## 2011-02-01 ENCOUNTER — Emergency Department (HOSPITAL_COMMUNITY)
Admission: EM | Admit: 2011-02-01 | Discharge: 2011-02-02 | Disposition: A | Discharge status: Home / Self Care | Payer: BC Managed Care – PPO | Attending: Emergency Medicine | Admitting: Emergency Medicine

## 2011-02-01 DIAGNOSIS — H5710 Ocular pain, unspecified eye: Secondary | ICD-10-CM | POA: Insufficient documentation

## 2011-02-01 DIAGNOSIS — G40909 Epilepsy, unspecified, not intractable, without status epilepticus: Secondary | ICD-10-CM | POA: Insufficient documentation

## 2011-02-01 DIAGNOSIS — Z79899 Other long term (current) drug therapy: Secondary | ICD-10-CM | POA: Insufficient documentation

## 2011-02-01 DIAGNOSIS — R110 Nausea: Secondary | ICD-10-CM | POA: Insufficient documentation

## 2011-02-01 DIAGNOSIS — G43909 Migraine, unspecified, not intractable, without status migrainosus: Secondary | ICD-10-CM | POA: Insufficient documentation

## 2011-02-01 DIAGNOSIS — J45909 Unspecified asthma, uncomplicated: Secondary | ICD-10-CM | POA: Insufficient documentation

## 2011-02-01 DIAGNOSIS — H53149 Visual discomfort, unspecified: Secondary | ICD-10-CM | POA: Insufficient documentation

## 2011-02-14 ENCOUNTER — Emergency Department (HOSPITAL_COMMUNITY)
Admission: EM | Admit: 2011-02-14 | Discharge: 2011-02-14 | Disposition: A | Discharge status: Home / Self Care | Payer: BC Managed Care – PPO | Attending: Emergency Medicine | Admitting: Emergency Medicine

## 2011-02-14 ENCOUNTER — Emergency Department (HOSPITAL_COMMUNITY): Payer: BC Managed Care – PPO

## 2011-02-14 DIAGNOSIS — Z79899 Other long term (current) drug therapy: Secondary | ICD-10-CM | POA: Insufficient documentation

## 2011-02-14 DIAGNOSIS — J45909 Unspecified asthma, uncomplicated: Secondary | ICD-10-CM | POA: Insufficient documentation

## 2011-02-14 DIAGNOSIS — G40909 Epilepsy, unspecified, not intractable, without status epilepticus: Secondary | ICD-10-CM | POA: Insufficient documentation

## 2011-02-14 DIAGNOSIS — R42 Dizziness and giddiness: Secondary | ICD-10-CM | POA: Insufficient documentation

## 2011-02-14 DIAGNOSIS — R079 Chest pain, unspecified: Secondary | ICD-10-CM | POA: Insufficient documentation

## 2011-02-14 LAB — POCT CARDIAC MARKERS
CKMB, poc: <1 ng/mL — ABNORMAL LOW (ref 1.0–8.0)
Myoglobin, poc: 31.2 ng/mL (ref 12–200)
Troponin i, poc: <0.05 ng/mL (ref 0.00–0.09)

## 2011-02-14 LAB — DIFFERENTIAL
Basophils Absolute: 0.1 10*3/uL (ref 0.0–0.1)
Basophils Relative: 1 % (ref 0–1)
Eosinophils Absolute: 0.5 10*3/uL (ref 0.0–0.7)
Neutro Abs: 3.1 10*3/uL (ref 1.7–7.7)
Neutrophils Relative %: 39 % — ABNORMAL LOW (ref 43–77)

## 2011-02-14 LAB — POCT I-STAT, CHEM 8
Calcium, Ion: 1.1 mmol/L — ABNORMAL LOW (ref 1.12–1.32)
HCT: 35 % — ABNORMAL LOW (ref 36.0–46.0)
Sodium: 138 mEq/L (ref 135–145)
TCO2: 18 mmol/L (ref 0–100)

## 2011-02-14 LAB — CBC
Platelets: 302 10*3/uL (ref 150–400)
RBC: 4.02 MIL/uL (ref 3.87–5.11)
WBC: 7.9 10*3/uL (ref 4.0–10.5)

## 2011-02-22 LAB — POCT I-STAT, CHEM 8
BUN: 4 mg/dL — ABNORMAL LOW (ref 6–23)
Chloride: 106 mEq/L (ref 96–112)
Creatinine, Ser: 0.8 mg/dL (ref 0.4–1.2)
Glucose, Bld: 108 mg/dL — ABNORMAL HIGH (ref 70–99)
Hemoglobin: 12.9 g/dL (ref 12.0–15.0)
Potassium: 3.7 mEq/L (ref 3.5–5.1)
Sodium: 139 mEq/L (ref 135–145)

## 2011-02-22 LAB — BASIC METABOLIC PANEL WITH GFR
BUN: 6 mg/dL (ref 6–23)
CO2: 25 meq/L (ref 19–32)
Calcium: 8.7 mg/dL (ref 8.4–10.5)
Chloride: 108 meq/L (ref 96–112)
Creatinine, Ser: 0.68 mg/dL (ref 0.4–1.2)
GFR calc non Af Amer: >60 mL/min
Glucose, Bld: 84 mg/dL (ref 70–99)
Potassium: 3.8 meq/L (ref 3.5–5.1)
Sodium: 138 meq/L (ref 135–145)

## 2011-02-22 LAB — CBC
HCT: 30.8 % — ABNORMAL LOW (ref 36.0–46.0)
HCT: 33.6 % — ABNORMAL LOW (ref 36.0–46.0)
Hemoglobin: 10.4 g/dL — ABNORMAL LOW (ref 12.0–15.0)
Hemoglobin: 11.3 g/dL — ABNORMAL LOW (ref 12.0–15.0)
MCH: 28.6 pg (ref 26.0–34.0)
MCH: 29.5 pg (ref 26.0–34.0)
MCHC: 33.6 g/dL (ref 30.0–36.0)
MCHC: 33.8 g/dL (ref 30.0–36.0)
MCV: 84.6 fL (ref 78.0–100.0)
MCV: 87.7 fL (ref 78.0–100.0)
Platelets: 227 K/uL (ref 150–400)
Platelets: 230 10*3/uL (ref 150–400)
RBC: 3.64 MIL/uL — ABNORMAL LOW (ref 3.87–5.11)
RBC: 3.83 MIL/uL — ABNORMAL LOW (ref 3.87–5.11)
RDW: 12.6 % (ref 11.5–15.5)
RDW: 12.8 % (ref 11.5–15.5)
WBC: 5.3 K/uL (ref 4.0–10.5)
WBC: 6.2 10*3/uL (ref 4.0–10.5)

## 2011-02-22 LAB — URINALYSIS, ROUTINE W REFLEX MICROSCOPIC
Bilirubin Urine: NEGATIVE
Glucose, UA: NEGATIVE mg/dL
Glucose, UA: NEGATIVE mg/dL
Hgb urine dipstick: NEGATIVE
Ketones, ur: NEGATIVE mg/dL
Ketones, ur: NEGATIVE mg/dL
Nitrite: NEGATIVE
Protein, ur: NEGATIVE mg/dL
Protein, ur: NEGATIVE mg/dL
Specific Gravity, Urine: 1.018 (ref 1.005–1.030)
Urobilinogen, UA: 0.2 mg/dL (ref 0.0–1.0)
pH: 5.5 (ref 5.0–8.0)
pH: 8 (ref 5.0–8.0)

## 2011-02-22 LAB — DIFFERENTIAL
Basophils Absolute: 0.1 K/uL (ref 0.0–0.1)
Basophils Absolute: 0.1 K/uL (ref 0.0–0.1)
Basophils Relative: 1 % (ref 0–1)
Basophils Relative: 2 % — ABNORMAL HIGH (ref 0–1)
Eosinophils Absolute: 0.4 K/uL (ref 0.0–0.7)
Eosinophils Absolute: 0.7 10*3/uL (ref 0.0–0.7)
Eosinophils Relative: 11 % — ABNORMAL HIGH (ref 0–5)
Eosinophils Relative: 8 % — ABNORMAL HIGH (ref 0–5)
Lymphocytes Relative: 44 % (ref 12–46)
Lymphocytes Relative: 45 % (ref 12–46)
Lymphs Abs: 2.3 K/uL (ref 0.7–4.0)
Lymphs Abs: 2.8 10*3/uL (ref 0.7–4.0)
Monocytes Absolute: 0.4 K/uL (ref 0.1–1.0)
Monocytes Absolute: 0.5 K/uL (ref 0.1–1.0)
Monocytes Relative: 8 % (ref 3–12)
Monocytes Relative: 9 % (ref 3–12)
Neutro Abs: 2.1 K/uL (ref 1.7–7.7)
Neutro Abs: 2.1 K/uL (ref 1.7–7.7)
Neutrophils Relative %: 33 % — ABNORMAL LOW (ref 43–77)
Neutrophils Relative %: 40 % — ABNORMAL LOW (ref 43–77)

## 2011-02-22 LAB — URINE MICROSCOPIC-ADD ON

## 2011-02-22 LAB — BASIC METABOLIC PANEL
BUN: 8 mg/dL (ref 6–23)
CO2: 25 mEq/L (ref 19–32)
Chloride: 107 mEq/L (ref 96–112)
Glucose, Bld: 86 mg/dL (ref 70–99)
Potassium: 3.7 mEq/L (ref 3.5–5.1)
Sodium: 139 mEq/L (ref 135–145)

## 2011-02-22 LAB — PROTIME-INR
INR: 1.07 (ref 0.00–1.49)
Prothrombin Time: 14.1 s (ref 11.6–15.2)

## 2011-02-22 LAB — APTT: aPTT: 25 s (ref 24–37)

## 2011-02-22 LAB — POCT PREGNANCY, URINE: Preg Test, Ur: NEGATIVE

## 2011-02-25 LAB — POCT I-STAT, CHEM 8
Calcium, Ion: 1.03 mmol/L — ABNORMAL LOW (ref 1.12–1.32)
Creatinine, Ser: 0.8 mg/dL (ref 0.4–1.2)
Glucose, Bld: 96 mg/dL (ref 70–99)
Hemoglobin: 12.2 g/dL (ref 12.0–15.0)
Potassium: 3.3 mEq/L — ABNORMAL LOW (ref 3.5–5.1)
TCO2: 18 mmol/L (ref 0–100)

## 2011-02-25 LAB — CBC
HCT: 35.8 % — ABNORMAL LOW (ref 36.0–46.0)
MCH: 31.4 pg (ref 26.0–34.0)
MCHC: 33.7 g/dL (ref 30.0–36.0)
MCV: 93.1 fL (ref 78.0–100.0)
Platelets: 230 10*3/uL (ref 150–400)
RDW: 12.8 % (ref 11.5–15.5)

## 2011-02-25 LAB — DIFFERENTIAL
Basophils Absolute: 0.1 10*3/uL (ref 0.0–0.1)
Basophils Relative: 1 % (ref 0–1)
Eosinophils Absolute: 0.4 10*3/uL (ref 0.0–0.7)
Eosinophils Relative: 7 % — ABNORMAL HIGH (ref 0–5)
Monocytes Absolute: 0.4 10*3/uL (ref 0.1–1.0)

## 2011-02-25 LAB — URINALYSIS, ROUTINE W REFLEX MICROSCOPIC
Bilirubin Urine: NEGATIVE
Hgb urine dipstick: NEGATIVE
Nitrite: NEGATIVE
Protein, ur: NEGATIVE mg/dL
Urobilinogen, UA: 0.2 mg/dL (ref 0.0–1.0)

## 2011-02-26 LAB — BASIC METABOLIC PANEL
BUN: 5 mg/dL — ABNORMAL LOW (ref 6–23)
BUN: 5 mg/dL — ABNORMAL LOW (ref 6–23)
CO2: 19 mEq/L (ref 19–32)
CO2: 22 mEq/L (ref 19–32)
Chloride: 111 mEq/L (ref 96–112)
Chloride: 111 mEq/L (ref 96–112)
Chloride: 114 mEq/L — ABNORMAL HIGH (ref 96–112)
Creatinine, Ser: 0.85 mg/dL (ref 0.4–1.2)
GFR calc Af Amer: >60 mL/min (ref >60)
GFR calc non Af Amer: >60 mL/min (ref >60)
Glucose, Bld: 90 mg/dL (ref 70–99)
Glucose, Bld: 96 mg/dL (ref 70–99)
Potassium: 3.8 mEq/L (ref 3.5–5.1)
Potassium: 3.9 mEq/L (ref 3.5–5.1)
Potassium: 4.1 mEq/L (ref 3.5–5.1)
Sodium: 139 mEq/L (ref 135–145)
Sodium: 140 mEq/L (ref 135–145)

## 2011-02-26 LAB — URINE MICROSCOPIC-ADD ON

## 2011-02-26 LAB — CBC
HCT: 25.9 % — ABNORMAL LOW (ref 36.0–46.0)
HCT: 28.6 % — ABNORMAL LOW (ref 36.0–46.0)
Hemoglobin: 8.2 g/dL — ABNORMAL LOW (ref 12.0–15.0)
Hemoglobin: 9.3 g/dL — ABNORMAL LOW (ref 12.0–15.0)
MCV: 75.6 fL — ABNORMAL LOW (ref 78.0–100.0)
MCV: 78.2 fL (ref 78.0–100.0)
Platelets: 237 10*3/uL (ref 150–400)
Platelets: 266 10*3/uL (ref 150–400)
Platelets: 268 10*3/uL (ref 150–400)
RBC: 3.41 MIL/uL — ABNORMAL LOW (ref 3.87–5.11)
RBC: 3.66 MIL/uL — ABNORMAL LOW (ref 3.87–5.11)
WBC: 4.4 10*3/uL (ref 4.0–10.5)
WBC: 5.4 10*3/uL (ref 4.0–10.5)
WBC: 5.5 10*3/uL (ref 4.0–10.5)
WBC: 7.2 10*3/uL (ref 4.0–10.5)

## 2011-02-26 LAB — COMPREHENSIVE METABOLIC PANEL
AST: 19 U/L (ref 0–37)
Albumin: 3.4 g/dL — ABNORMAL LOW (ref 3.5–5.2)
Alkaline Phosphatase: 57 U/L (ref 39–117)
Chloride: 107 mEq/L (ref 96–112)
GFR calc Af Amer: >60 mL/min (ref >60)
Potassium: 3.4 mEq/L — ABNORMAL LOW (ref 3.5–5.1)
Total Bilirubin: 0.3 mg/dL (ref 0.3–1.2)

## 2011-02-26 LAB — URINALYSIS, ROUTINE W REFLEX MICROSCOPIC
Glucose, UA: NEGATIVE mg/dL
Glucose, UA: NEGATIVE mg/dL
Ketones, ur: NEGATIVE mg/dL
Nitrite: NEGATIVE
Protein, ur: NEGATIVE mg/dL
Specific Gravity, Urine: 1.011 (ref 1.005–1.030)
Specific Gravity, Urine: 1.021 (ref 1.005–1.030)
pH: 6 (ref 5.0–8.0)

## 2011-02-26 LAB — IRON AND TIBC
Saturation Ratios: 4 % — ABNORMAL LOW (ref 20–55)
TIBC: 314 ug/dL (ref 250–470)
UIBC: 300 ug/dL

## 2011-02-26 LAB — ACTH STIMULATION, 3 TIME POINTS
Cortisol, 30 Min: 23.4 ug/dL (ref >20)
Cortisol, 60 Min: 29.1 ug/dL (ref >20)

## 2011-02-26 LAB — PHENYTOIN LEVEL, TOTAL
Phenytoin Lvl: 10.3 ug/mL (ref 10.0–20.0)
Phenytoin Lvl: <2.5 ug/mL — ABNORMAL LOW (ref 10.0–20.0)

## 2011-02-26 LAB — CK TOTAL AND CKMB (NOT AT ARMC): Relative Index: INVALID (ref 0.0–2.5)

## 2011-02-26 LAB — RETICULOCYTES: Retic Ct Pct: 1.3 % (ref 0.4–3.1)

## 2011-02-26 LAB — DIFFERENTIAL
Eosinophils Absolute: 0.5 10*3/uL (ref 0.0–0.7)
Lymphocytes Relative: 35 % (ref 12–46)
Lymphs Abs: 2.5 10*3/uL (ref 0.7–4.0)
Monocytes Relative: 11 % (ref 3–12)
Neutro Abs: 3.4 10*3/uL (ref 1.7–7.7)

## 2011-02-26 LAB — RAPID URINE DRUG SCREEN, HOSP PERFORMED
Benzodiazepines: NOT DETECTED
Cocaine: NOT DETECTED
Tetrahydrocannabinol: NOT DETECTED

## 2011-02-26 LAB — TYPE AND SCREEN: ABO/RH(D): AB POS

## 2011-02-26 LAB — HEMOGLOBIN AND HEMATOCRIT, BLOOD
HCT: 28.1 % — ABNORMAL LOW (ref 36.0–46.0)
Hemoglobin: 9.4 g/dL — ABNORMAL LOW (ref 12.0–15.0)
Hemoglobin: 9.8 g/dL — ABNORMAL LOW (ref 12.0–15.0)

## 2011-02-26 LAB — CARDIAC PANEL(CRET KIN+CKTOT+MB+TROPI)
CK, MB: 0.5 ng/mL (ref 0.3–4.0)
Total CK: 84 U/L (ref 7–177)
Total CK: 85 U/L (ref 7–177)

## 2011-02-26 LAB — TSH: TSH: 2.405 u[IU]/mL (ref 0.350–4.500)

## 2011-02-26 LAB — ABO/RH: ABO/RH(D): AB POS

## 2011-02-26 LAB — PREPARE RBC (CROSSMATCH)

## 2011-02-26 LAB — LIPASE, BLOOD: Lipase: 33 U/L (ref 11–59)

## 2011-02-26 LAB — CARBAMAZEPINE LEVEL, TOTAL: Carbamazepine Lvl: 7.7 ug/mL (ref 4.0–12.0)

## 2011-02-26 LAB — PREGNANCY, URINE: Preg Test, Ur: NEGATIVE

## 2011-02-26 LAB — FOLATE: Folate: 5.1 ng/mL

## 2011-03-01 LAB — CBC
HCT: 32.6 % — ABNORMAL LOW (ref 36.0–46.0)
Hemoglobin: 10.8 g/dL — ABNORMAL LOW (ref 12.0–15.0)
MCHC: 33.3 g/dL (ref 30.0–36.0)
MCV: 90.6 fL (ref 78.0–100.0)
MCV: 91.6 fL (ref 78.0–100.0)
Platelets: 244 10*3/uL (ref 150–400)
RBC: 3.6 MIL/uL — ABNORMAL LOW (ref 3.87–5.11)
RBC: 4.19 MIL/uL (ref 3.87–5.11)
WBC: 4.6 10*3/uL (ref 4.0–10.5)
WBC: 5.1 10*3/uL (ref 4.0–10.5)

## 2011-03-01 LAB — COMPREHENSIVE METABOLIC PANEL
ALT: 12 U/L (ref 0–35)
ALT: 15 U/L (ref 0–35)
AST: 13 U/L (ref 0–37)
AST: 13 U/L (ref 0–37)
AST: 16 U/L (ref 0–37)
Albumin: 4 g/dL (ref 3.5–5.2)
BUN: 6 mg/dL (ref 6–23)
CO2: 24 mEq/L (ref 19–32)
CO2: 25 mEq/L (ref 19–32)
Calcium: 8.9 mg/dL (ref 8.4–10.5)
Calcium: 9.3 mg/dL (ref 8.4–10.5)
Chloride: 103 mEq/L (ref 96–112)
Chloride: 111 mEq/L (ref 96–112)
Creatinine, Ser: 0.67 mg/dL (ref 0.4–1.2)
Creatinine, Ser: 0.72 mg/dL (ref 0.4–1.2)
GFR calc Af Amer: >60 mL/min (ref >60)
GFR calc Af Amer: >60 mL/min (ref >60)
GFR calc non Af Amer: >60 mL/min (ref >60)
GFR calc non Af Amer: >60 mL/min (ref >60)
Glucose, Bld: 78 mg/dL (ref 70–99)
Sodium: 136 mEq/L (ref 135–145)
Sodium: 136 mEq/L (ref 135–145)
Total Bilirubin: 0.5 mg/dL (ref 0.3–1.2)
Total Bilirubin: 0.7 mg/dL (ref 0.3–1.2)
Total Protein: 6.6 g/dL (ref 6.0–8.3)

## 2011-03-01 LAB — ALBUMIN: Albumin: 3.6 g/dL (ref 3.5–5.2)

## 2011-03-01 LAB — URINALYSIS, ROUTINE W REFLEX MICROSCOPIC
Bilirubin Urine: NEGATIVE
Glucose, UA: NEGATIVE mg/dL
Hgb urine dipstick: NEGATIVE
Specific Gravity, Urine: 1.008 (ref 1.005–1.030)

## 2011-03-01 LAB — PHENYTOIN LEVEL, TOTAL
Phenytoin Lvl: 14.3 ug/mL (ref 10.0–20.0)
Phenytoin Lvl: 6.6 ug/mL — ABNORMAL LOW (ref 10.0–20.0)

## 2011-03-01 LAB — ETHANOL: Alcohol, Ethyl (B): <5 mg/dL (ref 0–10)

## 2011-03-01 LAB — TSH: TSH: 1.086 u[IU]/mL (ref 0.350–4.500)

## 2011-03-08 IMAGING — CT CT HEAD W/O CM
1 series · 12 of 14 positions shown, 15 images · non-contrast
Comparison: 03/14/2010

CLINICAL DATA: Seizure and decreased level of consciousness.

CT HEAD WITHOUT CONTRAST
TECHNIQUE: Contiguous axial images were obtained from the base of
the skull through the vertex without contrast.

[Series 2: headseq 4.8 h45s · axial · 0.43mm/px · z∈[-188,-74]mm · 12 of 30 slices shown, 15 images]
[im 3/30  soft-tissue]
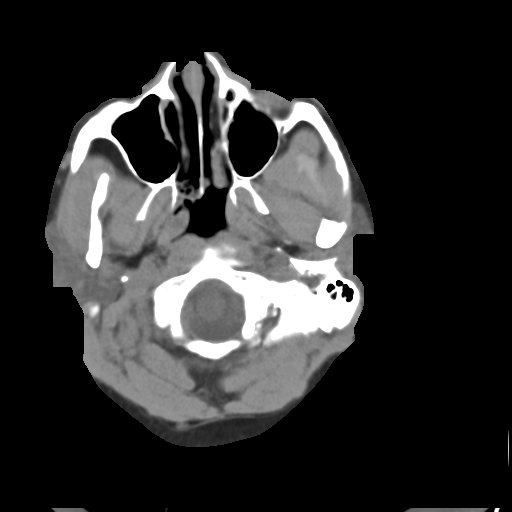
[im 3/30  bone]
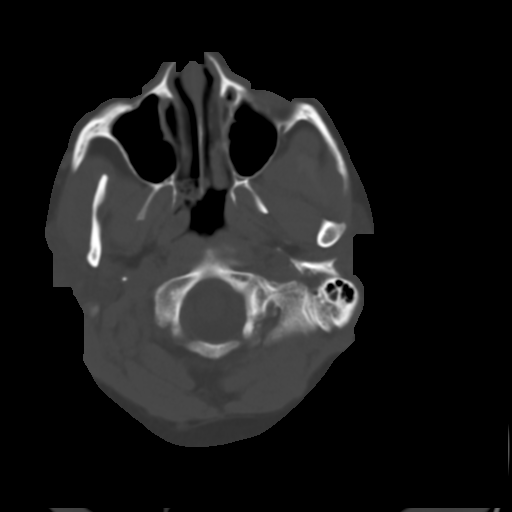
[im 5/30  bone]
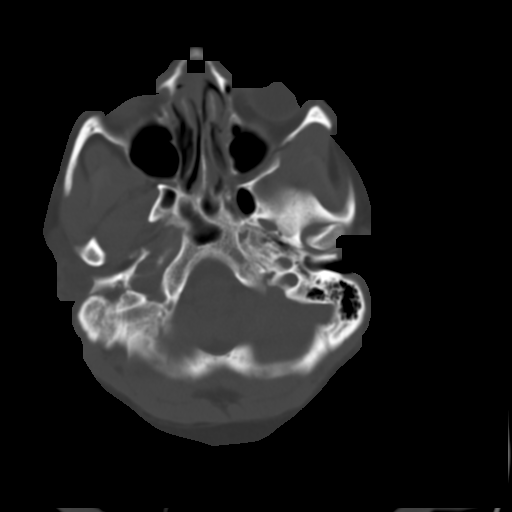
[im 7/30  bone]
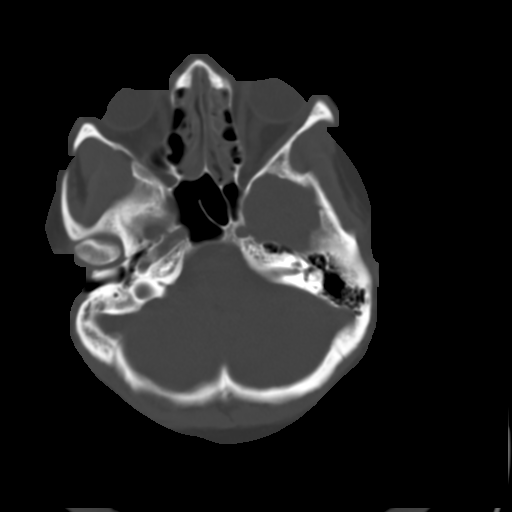
[im 9/30  bone]
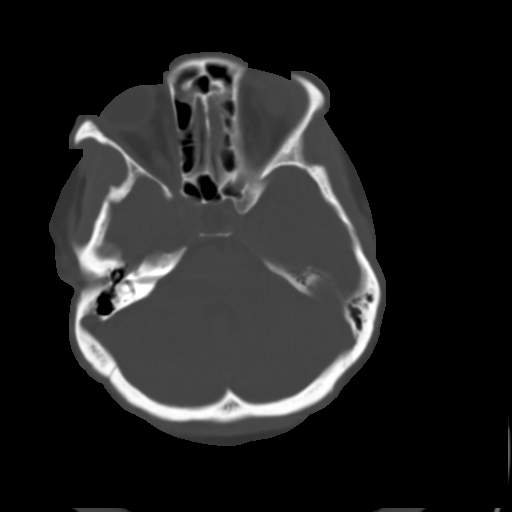
[im 12/30  soft-tissue]
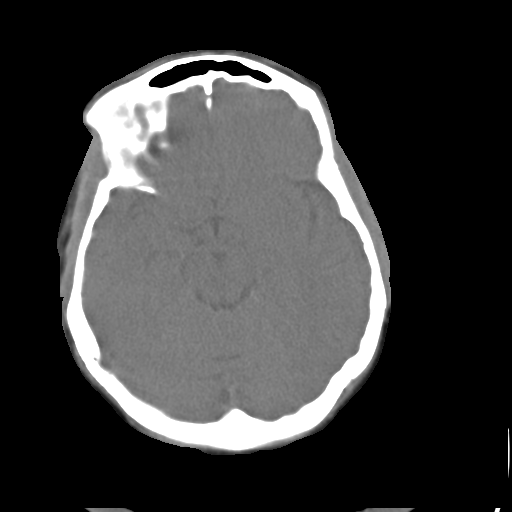
[im 12/30  bone]
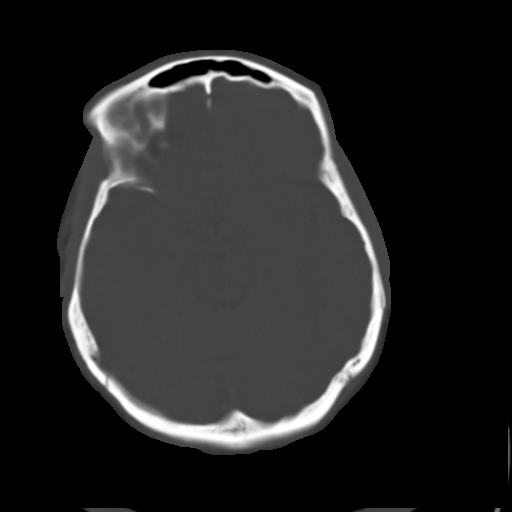
[im 14/30  bone]
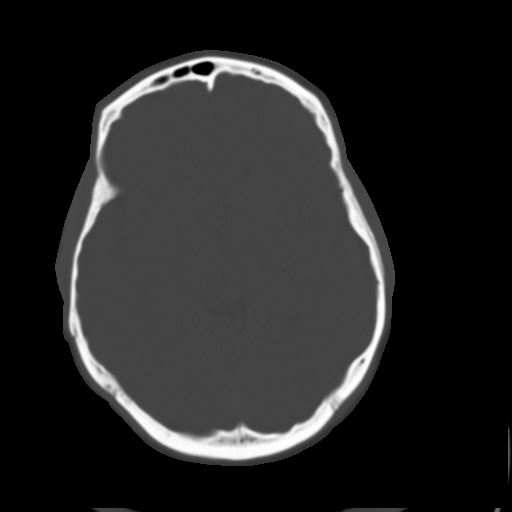
[im 16/30  bone]
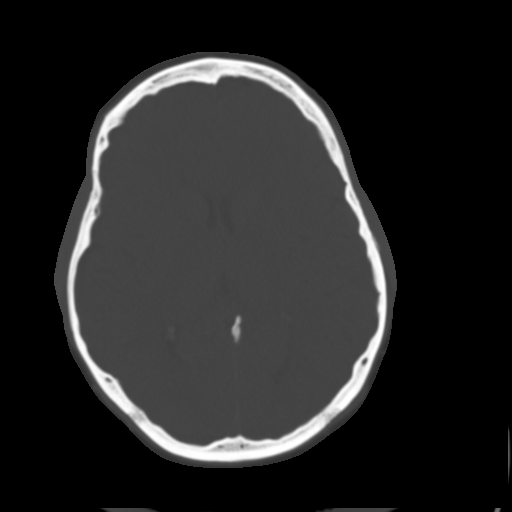
[im 18/30  bone]
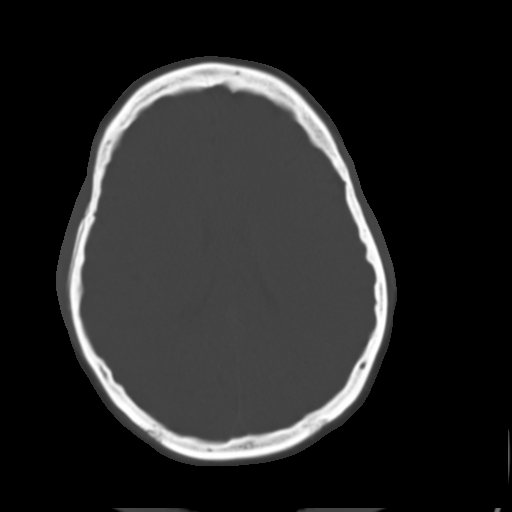
[im 21/30  soft-tissue]
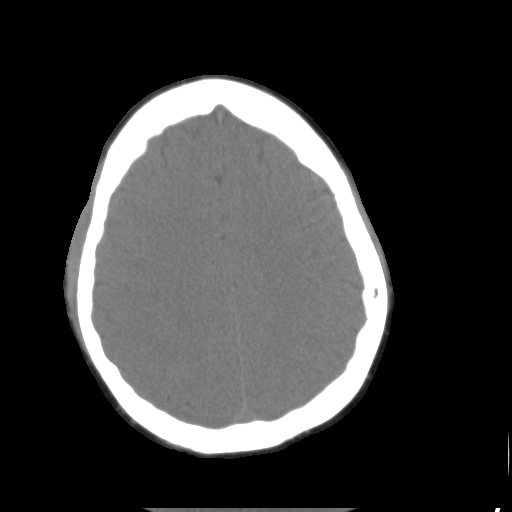
[im 21/30  bone]
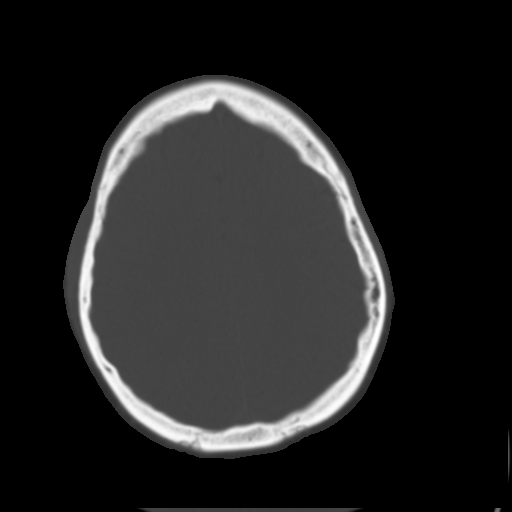
[im 23/30  bone]
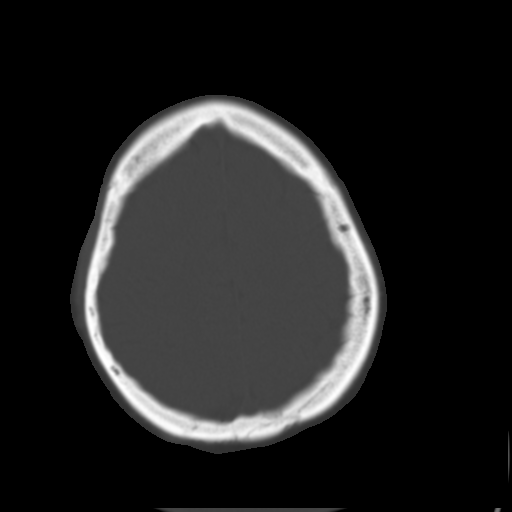
[im 25/30  bone]
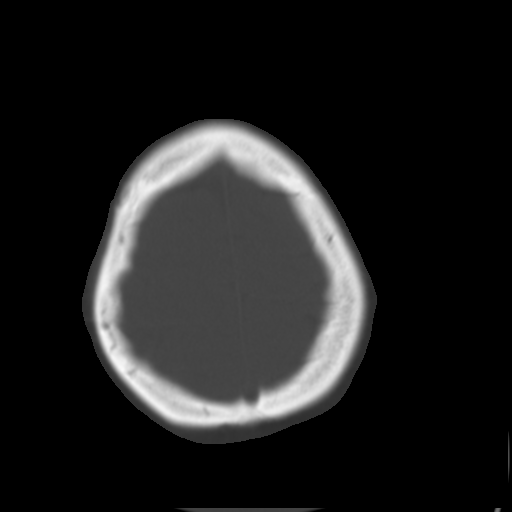
[im 27/30  bone]
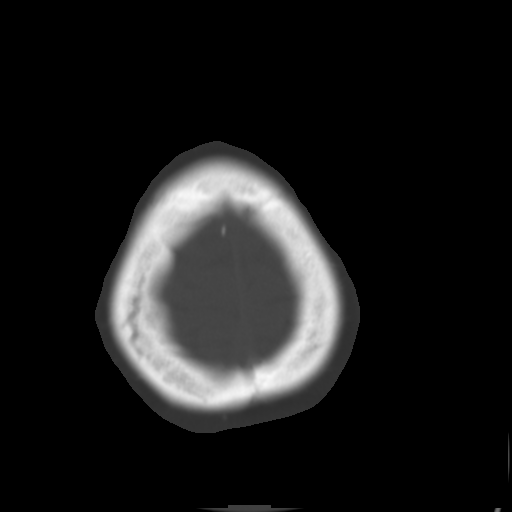

[12 of 14 positions shown; findings below may reference images not displayed]

FINDINGS: There is no evidence of acute hemorrhage, visible
infarction, mass effect or hydrocephalus.  No extra-axial fluid
collections are identified.  Bone windows are unremarkable.
IMPRESSION: No acute findings by head CT.

## 2011-03-08 IMAGING — CR DG CERVICAL SPINE COMPLETE 4+V
6 series · 6 of 6 positions shown · non-contrast
Comparison: None.

CLINICAL DATA: Seizure and neck pain.

CERVICAL SPINE - COMPLETE 4+ VIEW

[w c-spine lat]
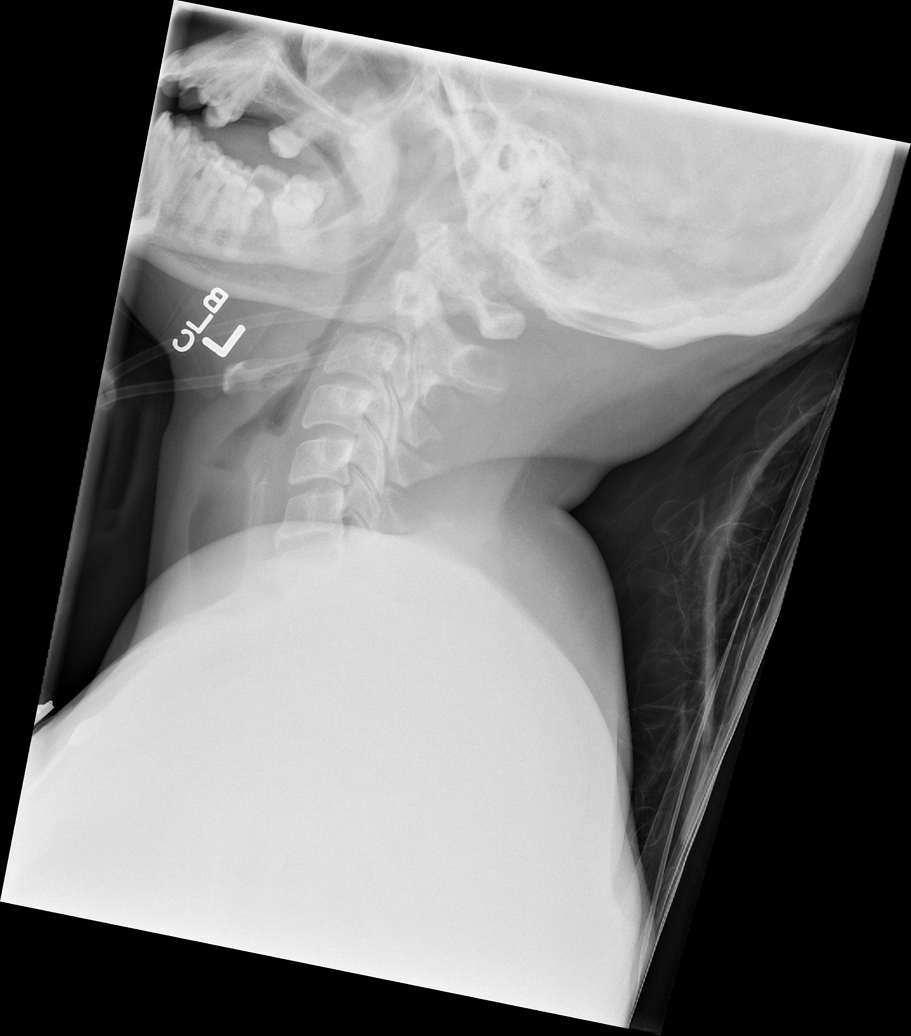

[w c-spine oblique (1 of 2)]
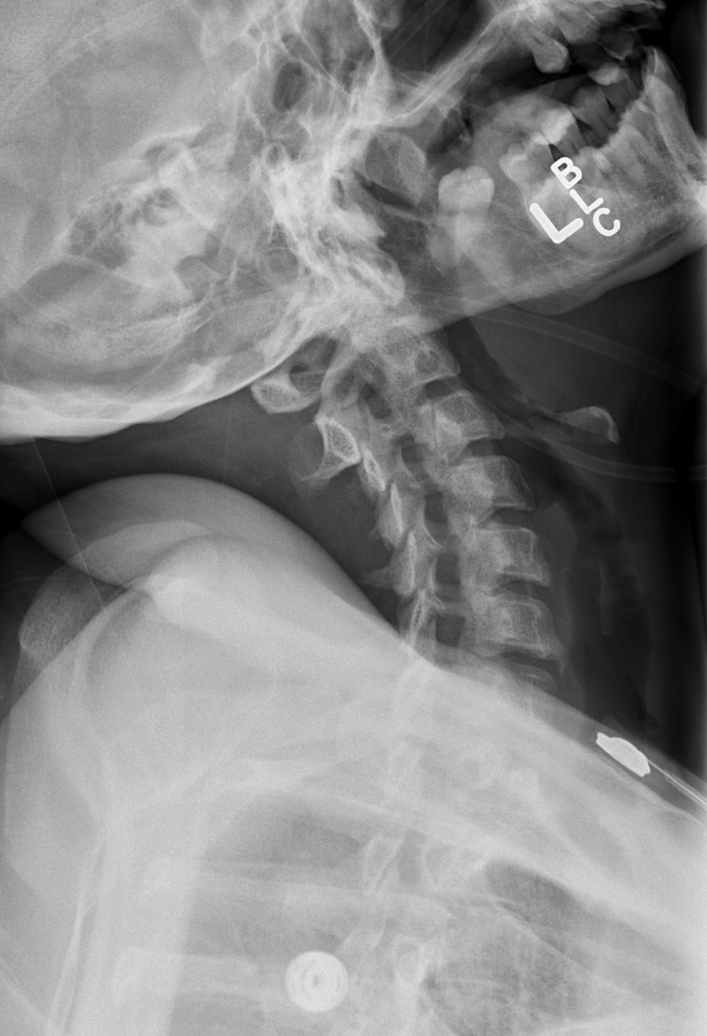

[w c-spine oblique (2 of 2)]
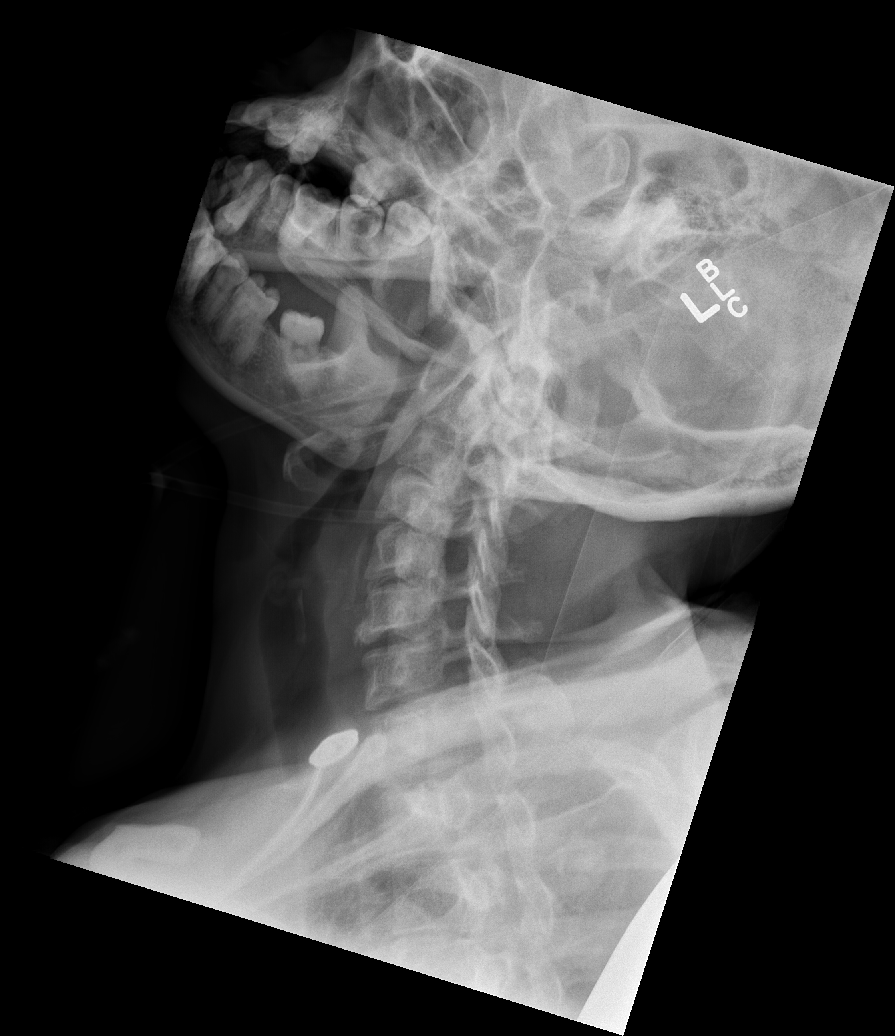

[w swimmers view *]
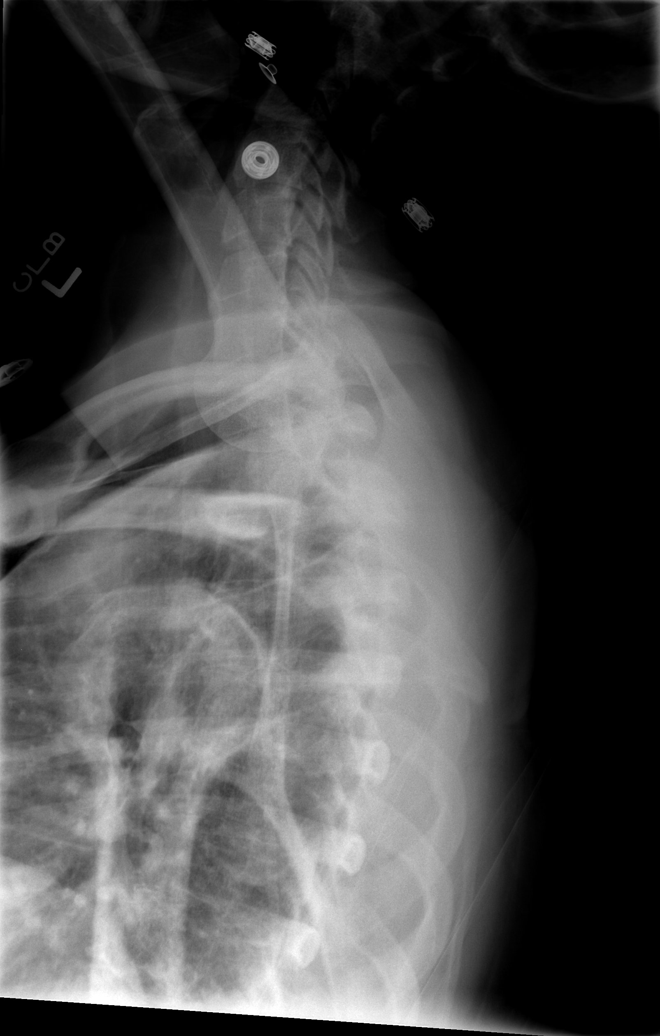

[view not recorded (1 of 2)]
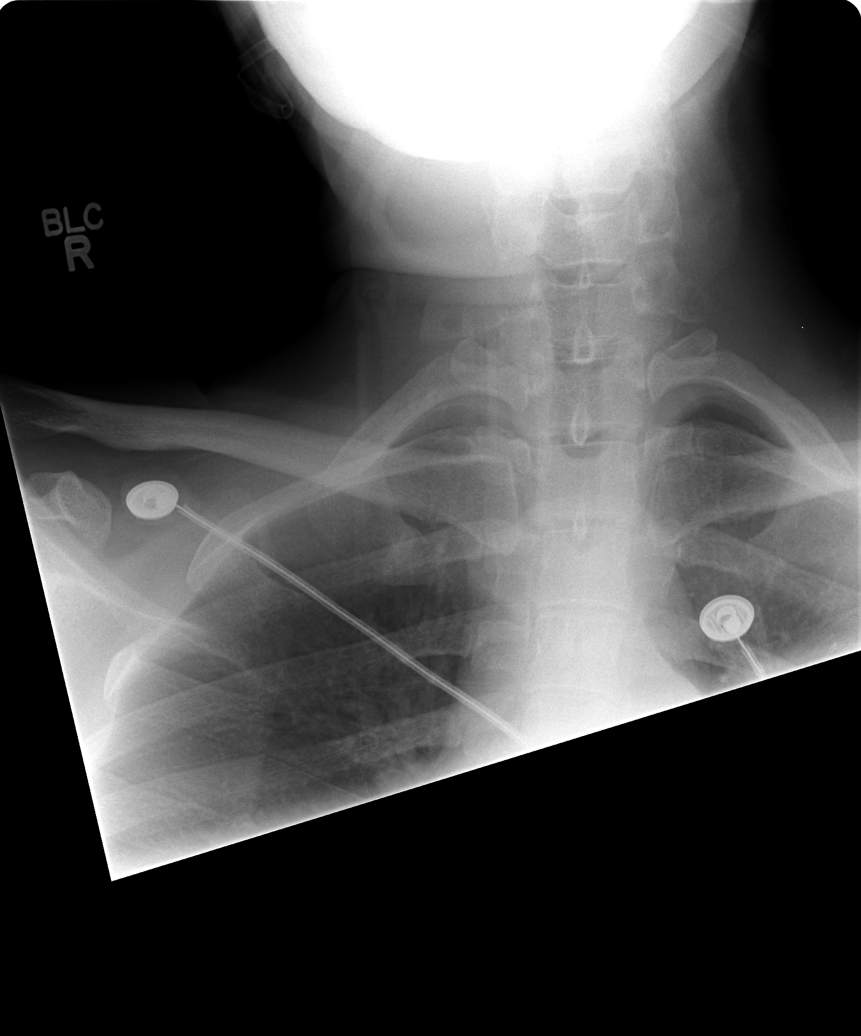

[view not recorded (2 of 2)]
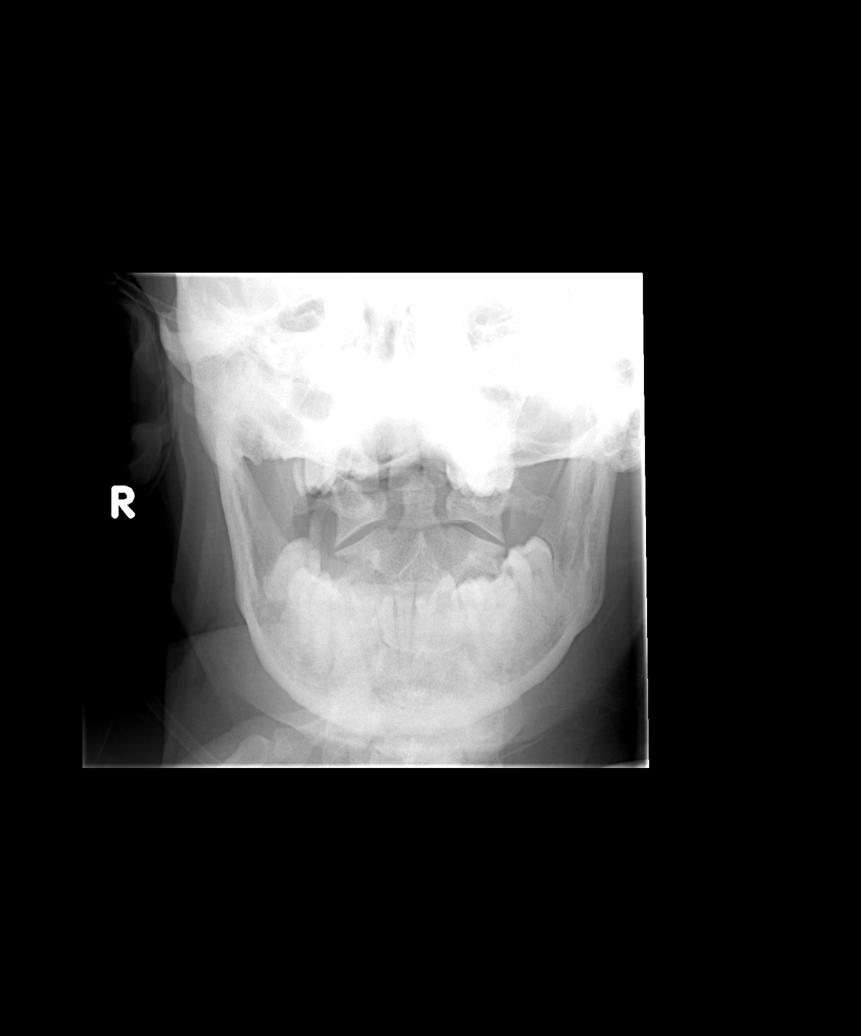

[6 of 6 positions shown; findings below may reference images not displayed]

FINDINGS: There is no evidence of cervical fracture or subluxation.
No soft tissue swelling identified.  No significant degenerative
changes.
IMPRESSION: Normal cervical spine.

## 2011-03-09 ENCOUNTER — Emergency Department (HOSPITAL_COMMUNITY): Payer: BC Managed Care – PPO

## 2011-03-09 ENCOUNTER — Emergency Department (HOSPITAL_COMMUNITY)
Admission: EM | Admit: 2011-03-09 | Discharge: 2011-03-09 | Disposition: A | Discharge status: Home / Self Care | Payer: BC Managed Care – PPO | Attending: Emergency Medicine | Admitting: Emergency Medicine

## 2011-03-09 DIAGNOSIS — R6883 Chills (without fever): Secondary | ICD-10-CM | POA: Insufficient documentation

## 2011-03-09 DIAGNOSIS — Z79899 Other long term (current) drug therapy: Secondary | ICD-10-CM | POA: Insufficient documentation

## 2011-03-09 DIAGNOSIS — J45909 Unspecified asthma, uncomplicated: Secondary | ICD-10-CM | POA: Insufficient documentation

## 2011-03-09 DIAGNOSIS — G40909 Epilepsy, unspecified, not intractable, without status epilepticus: Secondary | ICD-10-CM | POA: Insufficient documentation

## 2011-03-09 DIAGNOSIS — D649 Anemia, unspecified: Secondary | ICD-10-CM | POA: Insufficient documentation

## 2011-03-09 DIAGNOSIS — R071 Chest pain on breathing: Secondary | ICD-10-CM | POA: Insufficient documentation

## 2011-03-09 LAB — COMPREHENSIVE METABOLIC PANEL
ALT: 10 U/L (ref 0–35)
AST: 16 U/L (ref 0–37)
Alkaline Phosphatase: 49 U/L (ref 39–117)
CO2: 25 mEq/L (ref 19–32)
Chloride: 106 mEq/L (ref 96–112)
GFR calc Af Amer: >60 mL/min (ref >60)
Potassium: 3.7 mEq/L (ref 3.5–5.1)
Sodium: 141 mEq/L (ref 135–145)

## 2011-03-09 LAB — CBC
HCT: 28.7 % — ABNORMAL LOW (ref 36.0–46.0)
Hemoglobin: 9.7 g/dL — ABNORMAL LOW (ref 12.0–15.0)
MCH: 27.6 pg (ref 26.0–34.0)
MCHC: 33.8 g/dL (ref 30.0–36.0)
MCV: 81.5 fL (ref 78.0–100.0)

## 2011-03-14 LAB — RAPID URINE DRUG SCREEN, HOSP PERFORMED
Amphetamines: NOT DETECTED
Cocaine: NOT DETECTED
Opiates: NOT DETECTED
Tetrahydrocannabinol: NOT DETECTED

## 2011-03-14 LAB — URINALYSIS, ROUTINE W REFLEX MICROSCOPIC
Bilirubin Urine: NEGATIVE
Glucose, UA: NEGATIVE mg/dL
Ketones, ur: NEGATIVE mg/dL
Nitrite: POSITIVE — AB
Specific Gravity, Urine: 1.023 (ref 1.005–1.030)
pH: 7.5 (ref 5.0–8.0)

## 2011-03-14 LAB — WET PREP, GENITAL: Trich, Wet Prep: NONE SEEN

## 2011-03-14 LAB — GC/CHLAMYDIA PROBE AMP, GENITAL: Chlamydia, DNA Probe: NEGATIVE

## 2011-03-14 LAB — PHENYTOIN LEVEL, TOTAL: Phenytoin Lvl: <2.5 ug/mL — ABNORMAL LOW (ref 10.0–20.0)

## 2011-03-14 LAB — URINE MICROSCOPIC-ADD ON

## 2011-03-14 LAB — CBC
HCT: 29.9 % — ABNORMAL LOW (ref 36.0–46.0)
Hemoglobin: 9.8 g/dL — ABNORMAL LOW (ref 12.0–15.0)
RBC: 3.98 MIL/uL (ref 3.87–5.11)

## 2011-03-14 LAB — PREGNANCY, URINE: Preg Test, Ur: NEGATIVE

## 2011-03-14 LAB — BASIC METABOLIC PANEL
BUN: 8 mg/dL (ref 6–23)
CO2: 24 mEq/L (ref 19–32)
Chloride: 106 mEq/L (ref 96–112)
GFR calc Af Amer: >60 mL/min (ref >60)
Potassium: 4 mEq/L (ref 3.5–5.1)

## 2011-03-14 LAB — POCT PREGNANCY, URINE: Preg Test, Ur: NEGATIVE

## 2011-03-14 LAB — CARBAMAZEPINE LEVEL, TOTAL: Carbamazepine Lvl: <2 ug/mL — ABNORMAL LOW (ref 4.0–12.0)

## 2011-03-15 LAB — URINALYSIS, ROUTINE W REFLEX MICROSCOPIC
Bilirubin Urine: NEGATIVE
Hgb urine dipstick: NEGATIVE
Ketones, ur: NEGATIVE mg/dL
Protein, ur: NEGATIVE mg/dL
Urobilinogen, UA: 1 mg/dL (ref 0.0–1.0)

## 2011-03-15 LAB — BASIC METABOLIC PANEL
BUN: 10 mg/dL (ref 6–23)
CO2: 24 mEq/L (ref 19–32)
Calcium: 8.9 mg/dL (ref 8.4–10.5)
Creatinine, Ser: 0.72 mg/dL (ref 0.4–1.2)
Glucose, Bld: 94 mg/dL (ref 70–99)

## 2011-03-15 LAB — PHENYTOIN LEVEL, TOTAL: Phenytoin Lvl: 7.4 ug/mL — ABNORMAL LOW (ref 10.0–20.0)

## 2011-03-15 LAB — CBC
HCT: 28.3 % — ABNORMAL LOW (ref 36.0–46.0)
MCV: 77.1 fL — ABNORMAL LOW (ref 78.0–100.0)
RBC: 3.67 MIL/uL — ABNORMAL LOW (ref 3.87–5.11)

## 2011-03-16 LAB — BASIC METABOLIC PANEL
CO2: 24 mEq/L (ref 19–32)
Calcium: 8.7 mg/dL (ref 8.4–10.5)
Chloride: 106 mEq/L (ref 96–112)
GFR calc Af Amer: >60 mL/min (ref >60)
Sodium: 136 mEq/L (ref 135–145)

## 2011-03-16 LAB — POCT I-STAT, CHEM 8
BUN: 6 mg/dL (ref 6–23)
Calcium, Ion: 1.1 mmol/L — ABNORMAL LOW (ref 1.12–1.32)
Chloride: 106 mEq/L (ref 96–112)
Chloride: 109 mEq/L (ref 96–112)
Glucose, Bld: 92 mg/dL (ref 70–99)
HCT: 30 % — ABNORMAL LOW (ref 36.0–46.0)
Hemoglobin: 10.2 g/dL — ABNORMAL LOW (ref 12.0–15.0)
Sodium: 140 mEq/L (ref 135–145)

## 2011-03-16 LAB — URINE MICROSCOPIC-ADD ON

## 2011-03-16 LAB — URINALYSIS, ROUTINE W REFLEX MICROSCOPIC
Bilirubin Urine: NEGATIVE
Glucose, UA: NEGATIVE mg/dL
Ketones, ur: NEGATIVE mg/dL
Nitrite: NEGATIVE
Protein, ur: NEGATIVE mg/dL
Urobilinogen, UA: 1 mg/dL (ref 0.0–1.0)
pH: 6 (ref 5.0–8.0)
pH: 6 (ref 5.0–8.0)

## 2011-03-16 LAB — RAPID URINE DRUG SCREEN, HOSP PERFORMED: Tetrahydrocannabinol: NOT DETECTED

## 2011-03-16 LAB — PHENYTOIN LEVEL, TOTAL: Phenytoin Lvl: 11.2 ug/mL (ref 10.0–20.0)

## 2011-03-16 LAB — CARBAMAZEPINE LEVEL, TOTAL: Carbamazepine Lvl: 4.4 ug/mL (ref 4.0–12.0)

## 2011-03-16 LAB — CK: Total CK: 110 U/L (ref 7–177)

## 2011-03-20 LAB — BASIC METABOLIC PANEL
CO2: 26 mEq/L (ref 19–32)
CO2: 28 mEq/L (ref 19–32)
Calcium: 8.5 mg/dL (ref 8.4–10.5)
Calcium: 8.8 mg/dL (ref 8.4–10.5)
Chloride: 109 mEq/L (ref 96–112)
GFR calc Af Amer: >60 mL/min (ref >60)
GFR calc Af Amer: >60 mL/min (ref >60)
GFR calc non Af Amer: >60 mL/min (ref >60)
Glucose, Bld: 81 mg/dL (ref 70–99)
Potassium: 3.6 mEq/L (ref 3.5–5.1)
Sodium: 140 mEq/L (ref 135–145)
Sodium: 142 mEq/L (ref 135–145)

## 2011-03-20 LAB — URINALYSIS, ROUTINE W REFLEX MICROSCOPIC
Bilirubin Urine: NEGATIVE
Glucose, UA: NEGATIVE mg/dL
Hgb urine dipstick: NEGATIVE
Ketones, ur: 15 mg/dL — AB
Nitrite: POSITIVE — AB
Nitrite: NEGATIVE
Specific Gravity, Urine: 1.01 (ref 1.005–1.030)
Specific Gravity, Urine: 1.025 (ref 1.005–1.030)
Urobilinogen, UA: 1 mg/dL (ref 0.0–1.0)
Urobilinogen, UA: 1 mg/dL (ref 0.0–1.0)
Urobilinogen, UA: 1 mg/dL (ref 0.0–1.0)
pH: 6.5 (ref 5.0–8.0)
pH: 7 (ref 5.0–8.0)

## 2011-03-20 LAB — CBC
HCT: 26.1 % — ABNORMAL LOW (ref 36.0–46.0)
Hemoglobin: 8.6 g/dL — ABNORMAL LOW (ref 12.0–15.0)
Hemoglobin: 8.8 g/dL — ABNORMAL LOW (ref 12.0–15.0)
MCHC: 32.4 g/dL (ref 30.0–36.0)
MCHC: 32.8 g/dL (ref 30.0–36.0)
MCV: 78.5 fL (ref 78.0–100.0)
RBC: 3.32 MIL/uL — ABNORMAL LOW (ref 3.87–5.11)
RBC: 3.44 MIL/uL — ABNORMAL LOW (ref 3.87–5.11)
RDW: 17.4 % — ABNORMAL HIGH (ref 11.5–15.5)
WBC: 7.7 10*3/uL (ref 4.0–10.5)

## 2011-03-20 LAB — URINE MICROSCOPIC-ADD ON

## 2011-03-20 LAB — DIFFERENTIAL
Basophils Absolute: 0.1 10*3/uL (ref 0.0–0.1)
Basophils Relative: 1 % (ref 0–1)
Eosinophils Relative: 5 % (ref 0–5)
Lymphocytes Relative: 46 % (ref 12–46)
Lymphs Abs: 1.9 10*3/uL (ref 0.7–4.0)
Monocytes Absolute: 0.3 10*3/uL (ref 0.1–1.0)
Monocytes Absolute: 0.7 10*3/uL (ref 0.1–1.0)
Monocytes Relative: 7 % (ref 3–12)
Monocytes Relative: 9 % (ref 3–12)
Neutro Abs: 1.9 10*3/uL (ref 1.7–7.7)
Neutro Abs: 4.9 10*3/uL (ref 1.7–7.7)

## 2011-03-20 LAB — POCT PREGNANCY, URINE: Preg Test, Ur: NEGATIVE

## 2011-03-20 LAB — PREGNANCY, URINE: Preg Test, Ur: NEGATIVE

## 2011-03-20 LAB — GLUCOSE, CAPILLARY: Glucose-Capillary: 83 mg/dL (ref 70–99)

## 2011-03-22 LAB — URINALYSIS, ROUTINE W REFLEX MICROSCOPIC
Ketones, ur: NEGATIVE mg/dL
Nitrite: NEGATIVE
Specific Gravity, Urine: 1.016 (ref 1.005–1.030)

## 2011-03-22 LAB — CBC
HCT: 29.5 % — ABNORMAL LOW (ref 36.0–46.0)
Hemoglobin: 9.7 g/dL — ABNORMAL LOW (ref 12.0–15.0)
MCHC: 32.8 g/dL (ref 30.0–36.0)
MCV: 79.5 fL (ref 78.0–100.0)
RDW: 16 % — ABNORMAL HIGH (ref 11.5–15.5)

## 2011-03-22 LAB — DIFFERENTIAL
Basophils Absolute: 0 10*3/uL (ref 0.0–0.1)
Basophils Relative: 1 % (ref 0–1)
Eosinophils Relative: 6 % — ABNORMAL HIGH (ref 0–5)
Monocytes Absolute: 0.3 10*3/uL (ref 0.1–1.0)

## 2011-03-22 LAB — POCT I-STAT, CHEM 8
BUN: 5 mg/dL — ABNORMAL LOW (ref 6–23)
Chloride: 105 mEq/L (ref 96–112)
Glucose, Bld: 82 mg/dL (ref 70–99)
HCT: 31 % — ABNORMAL LOW (ref 36.0–46.0)
Potassium: 4.1 mEq/L (ref 3.5–5.1)

## 2011-03-22 LAB — CARBAMAZEPINE LEVEL, TOTAL: Carbamazepine Lvl: <2 ug/mL — ABNORMAL LOW (ref 4.0–12.0)

## 2011-03-22 LAB — RAPID URINE DRUG SCREEN, HOSP PERFORMED
Barbiturates: POSITIVE — AB
Benzodiazepines: NOT DETECTED
Cocaine: NOT DETECTED

## 2011-03-22 LAB — PHENYTOIN LEVEL, TOTAL: Phenytoin Lvl: <2.5 ug/mL — ABNORMAL LOW (ref 10.0–20.0)

## 2011-03-23 LAB — CBC
HCT: 30.7 % — ABNORMAL LOW (ref 36.0–46.0)
MCHC: 32.9 g/dL (ref 30.0–36.0)
MCV: 80.3 fL (ref 78.0–100.0)
RBC: 3.83 MIL/uL — ABNORMAL LOW (ref 3.87–5.11)
WBC: 4.8 10*3/uL (ref 4.0–10.5)

## 2011-03-23 LAB — DIFFERENTIAL
Eosinophils Absolute: 0.3 10*3/uL (ref 0.0–0.7)
Eosinophils Relative: 6 % — ABNORMAL HIGH (ref 0–5)
Lymphs Abs: 2.6 10*3/uL (ref 0.7–4.0)
Monocytes Absolute: 0.3 10*3/uL (ref 0.1–1.0)
Monocytes Relative: 7 % (ref 3–12)

## 2011-03-23 LAB — BASIC METABOLIC PANEL
CO2: 26 mEq/L (ref 19–32)
Chloride: 106 mEq/L (ref 96–112)
GFR calc Af Amer: >60 mL/min (ref >60)
Potassium: 3.8 mEq/L (ref 3.5–5.1)

## 2011-03-28 LAB — CBC
HCT: 32 % — ABNORMAL LOW (ref 36.0–46.0)
Hemoglobin: 10.4 g/dL — ABNORMAL LOW (ref 12.0–15.0)
Platelets: 265 10*3/uL (ref 150–400)
RBC: 3.93 MIL/uL (ref 3.87–5.11)
WBC: 5.8 10*3/uL (ref 4.0–10.5)

## 2011-03-28 LAB — COMPREHENSIVE METABOLIC PANEL
AST: 23 U/L (ref 0–37)
Albumin: 3.7 g/dL (ref 3.5–5.2)
Alkaline Phosphatase: 63 U/L (ref 39–117)
BUN: 7 mg/dL (ref 6–23)
Chloride: 109 mEq/L (ref 96–112)
Potassium: 3.7 mEq/L (ref 3.5–5.1)
Total Bilirubin: 0.5 mg/dL (ref 0.3–1.2)

## 2011-03-28 LAB — DIFFERENTIAL
Basophils Absolute: 0 10*3/uL (ref 0.0–0.1)
Basophils Relative: 1 % (ref 0–1)
Eosinophils Relative: 4 % (ref 0–5)
Monocytes Absolute: 0.3 10*3/uL (ref 0.1–1.0)
Neutro Abs: 2.9 10*3/uL (ref 1.7–7.7)

## 2011-03-28 LAB — URINALYSIS, ROUTINE W REFLEX MICROSCOPIC
Bilirubin Urine: NEGATIVE
Glucose, UA: NEGATIVE mg/dL
Ketones, ur: NEGATIVE mg/dL
Protein, ur: NEGATIVE mg/dL

## 2011-03-28 LAB — URINE MICROSCOPIC-ADD ON

## 2011-03-28 LAB — POCT URINALYSIS DIP (DEVICE)
Hgb urine dipstick: NEGATIVE
Protein, ur: 30 mg/dL — AB
Specific Gravity, Urine: 1.02 (ref 1.005–1.030)
pH: 7 (ref 5.0–8.0)

## 2011-04-25 NOTE — Consult Note (Signed)
NAMESCOTTIE, STANISH NO.:  [id number]   MEDICAL RECORD NO.:  [id number]          PATIENT TYPE:  INP   LOCATION:  3738                         FACILITY:  [facility]   PHYSICIAN:  Bevelyn Buckles. Champey, M.D.DATE OF BIRTH:  Oct 06, 1976   DATE OF CONSULTATION:  DATE OF DISCHARGE:                                 CONSULTATION   REASON FOR CONSULTATION:  Seizure.   HISTORY OF PRESENT ILLNESS:  [name] is a 35 year old African American  female with past medical history of asthma and seizures, who presents  with recurrent seizures since last week.  Patient has a history of  seizures and has not [name] taking her seizure medicine for the last three  weeks.  She presented on September 24, 2007 in the emergency room with  complaint of a headache.  September 26, 2007 complained of a seizure.  September 28, 2007 complained of recurrent seizures.  Patient has also  [name] complaining of severe nausea and vomiting since last Saturday.  She  is very drowsy, following commands appropriately, and complaining of  generalized weakness/fatigue.  She denies any focal weakness, numbness,  swelling problems, chewing problems, dizziness, or vertigo.  She does  complain of slight blurry vision.  Her seizures are usually complete  with of positive loss of consciousness with generalized shaking, tongue-  biting, and occasional incontinence.  The chart was reviewed.   PAST MEDICAL HISTORY:  Positive for seizures and asthma.   CURRENT MEDICATIONS:  Include Tegretol, Dilantin, and Vantin.   ALLERGIES:  PENICILLIN.   FAMILY HISTORY:  Positive for diabetes, cancer, and seizures.   SOCIAL HISTORY:  Patient lives alone.  Smokes one cigarette per day.  Denies any drug or alcohol use.   REVIEW OF SYSTEMS:  Positive as per HPI.  Review of systems is negative,  as per HPI and greater than seven other organ systems.   EXAMINATION:  VITALS:  Temperature is 98.0.  Pulse is 65.  Respirations  16.  Blood pressure is  100/73.  O2 sat is 96%.  HEENT:  Normocephalic, atraumatic.  Extraocular muscles are intact.  Face is symmetric.  NECK:  Supple.  No carotid bruits.  HEART:  Regular.  LUNGS:  The lungs are clear.  ABDOMEN:  Soft.  EXTREMITIES:  The extremities show good pulses.  NEUROLOGICAL EXAMINATION:  Patient is awake, yet drowsy.  She is  following commands appropriately.  Language is slow and slightly  dysarthric at times.  Cranial nerves II-XII are grossly intact.  Motor  examination shows 4+/5 strength throughout, and normal tone.  No drift  is noted.  Sensory examination is within normal limits to light touch.  Reflexes are 1+ throughout.  Cerebellar function is within normal  limits.  Finger-to-nose, gait were not assessed secondary to safety.   LABORATORIES:  Tegretol level is 11.7, Dilantin level is 18.8.  WBCs  5.7, hematocrit 10.1, hematocrit 30.5, platelets 257, sodium is 139,  potassium is 3.5, chloride is 103, CO2 is 28, BUN less than 1,  creatinine of 0.74, glucose 96, calcium is 8.6, albumin 3.3.  ALT is  within normal limits.  MRI of the brain was essentially normal.   IMPRESSION:  This is a 35 year old [place] American female with a past  medical history of seizures.  Had breakthrough seizures secondary to  noncompliance with her medication.  She has had no further seizures  since being back on her medication.  Her levels have [name] therapeutic.  We will continue the current doses of this medication, and follow the  levels.  We will check an EEG.  We will follow the patient while she is  in the hospital.      Bevelyn Buckles. Nash Shearer, M.D.  Electronically Signed     DRC/MEDQ  D:  09/30/2007  T:  10/01/2007  Job:  [id number]

## 2011-04-25 NOTE — Procedures (Signed)
CLINICAL HISTORY:  This is a routine EEG done with photic stimulation  but not hyperventilation.  The patient is described as awake and drowsy.  This is a 35 year old woman with a history of seizures and breakthrough  activity due to medication noncompliance.  EEG is performed for  evaluation.   DESCRIPTION:  The dominant rhythm is tracing of a moderate amplitude  alpha rhythm of 9-10 Hz which predominates posteriorly, appears without  abnormal asymmetry, attenuates with eye opening and closing.  A fairly  abundant low amplitude fast activity seen frontally and centrally  without abnormal asymmetry.  Mostly records remain in drowsiness, as  evidenced by fragmentation of the background and appearance of moderate  amplitude 8 Hz theta rhythm.  No focal abnormalities were seen.  Photic  stimulation produced symmetric driving responses.  Hyperventilation was  not performed.  Single channel devoted to EKG revealed sinus rhythm  throughout at a rate of approximately 72 beats per minute.   CONCLUSIONS:  Normal study in awake and drowsy states.      Michael L. Thad Ranger, M.D.  Electronically Signed     [id number]  D:  10/01/2007 19:01:04  T:  10/02/2007 14:15:48  Job #:  [id number]
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
## 2011-04-28 NOTE — Op Note (Signed)
Nicole [name], Nicole [name] NO.:  [id number]   MEDICAL RECORD NO.:  [id number]                   PATIENT TYPE:  INP   LOCATION:  9103                                 FACILITY:  [facility]   PHYSICIAN:  Lesly Dukes, M.D.              DATE OF BIRTH:  04-03-1976   DATE OF PROCEDURE:  12/27/2003  DATE OF DISCHARGE:                                 OPERATIVE REPORT   PREOPERATIVE DIAGNOSIS:  A 35 year old para 2-0-0-2 desiring permanent  sterilization.   POSTOPERATIVE DIAGNOSIS:  A 35 year old para 2-0-0-2 desiring permanent  sterilization.   PROCEDURE:  [place] postpartum bilateral tubal ligation.   SURGEON:  Lesly Dukes, M.D.   ESTIMATED BLOOD LOSS:  Minimal.   ANESTHESIA:  Epidural.   PATHOLOGY:  Portions of bilateral fallopian tubes.   COMPLICATIONS:  None.   DESCRIPTION OF PROCEDURE:  After informed consent was obtained, the patient  was taken to the operating room where epidural anesthesia was found to be  adequate.  The patient was placed in the dorsal supine position and prepared  in the normal sterile fashion.  An infraumbilical skin incision was made  with a scalpel and carried down to fascia.  The fascia was incised in the  midline, and this incision was extended bilaterally with the Mayo scissors.  The peritoneum was identified, tented up, and entered __________ Metzenbaum  scissors.  This incision was extended bilaterally.  The retractors were  placed into the abdomen.  The left fallopian tube was identified and  followed out to its fimbriated end.  A Tanja Port was used to elevate the  middle portion which was then doubly ligated and transected.  Good  hemostasis was noted.  The left fallopian tube was returned to the abdomen.  The right fallopian tube was then identified and followed out to its  fimbriated end.  The middle portion of the tube was then elevated with a  Babcock clamp, doubly ligated, and then transected.  Good hemostasis  was  noted from the right fallopian tube.  Right fallopian tube was returned to  the abdomen.  Bilateral portions of the fallopian tubes were passed off the  table and sent to pathology.  Good hemostasis was noted one more time.  The  fascia was then closed with 0 Vicryl in a running fashion.  Subcutaneous  tissue was found to be hemostatic.  Skin was closed with 3-0  Vicryl__________.  The patient tolerated the procedure well.  Sponge, lap,  instrument, and needle count were correct x 2, and the patient went to the  recovery room in stable condition.                                               Lesly Dukes, M.D.  KHL/MEDQ  D:  12/27/2003  T:  12/27/2003  Job:  [id number]

## 2011-04-28 NOTE — Discharge Summary (Signed)
[place]  Patient:    Nicole [name], Nicole [name]                        MRN: [id number] Adm. Date:  [id number] Disc. Date: [id number] Attending:  Madaline Guthrie Dictator:   Tawni Millers CC:         Kelli Hope, M.D.  Outpatient Clinic   Discharge Summary  CONSULTANTS:  Dr. Thad Ranger, [facility].  DISCHARGE DIAGNOSES: 1. Seizure disorder of unknown etiology, recent increase in activity.    Negative magnetic resonance imaging this admission.  Electroencephalogram    negative this admission. 2. Headaches, possibly migraines. 3. Possible cervical cancer.  DISCHARGE MEDICATIONS: 1. Dilantin 100 mg 1 p.o. t.i.d. 2. Carbamazepine 200 mg 2 p.o. q.a.m. 1 p.o. q midday and 2 p.o. q.p.m. 3. Ambien 5 mg p.o. q.h.s. p.r.n. sleep. 4. Ibuprofen and Imitrex p.r.n.  PROCEDURES THIS ADMISSION:  The patient had a CT of the head which was negative as well as an MR of the brain which was also negative.  She also had an EEG which was read as normal.  BRIEF HISTORY OF PRESENT ILLNESS:  This is a 35 year old with known seizure disorder since she was 35 years old, of unknown etiology, who presented to the ER for the fourth time in a week with headache and seizures.  She remained postictal in the emergency room for several hours, and therefore was admitted for observation and work-up of her seizures.  Mother did relate that the patient had been under more stress over the past month and has complained more of headaches and has been sleeping less over the past month.  She also related a very strong family history of seizures, in the patients mother, uncle, and aunt all have seizure disorder of unknown etiology.  The patient has had seizures since she was 35 years old.  PHYSICAL EXAMINATION ON ADMISSION:  VITAL SIGNS:  Temperature 98.1, blood pressure 114/73, pulse 78, respirations 20, saturating 98%.  GENERAL:  She is very drowsy but arousable and follows some  simple commands before falling asleep again.  HEENT:  Her pupils are dilated approximately 5 mm and slowly reactive to 4 mm. She has no tongue ulcerations, no erythema or exudate.  Her right temple is mildly tender.  LUNGS:  Clear.  HEART:  Regular rate and rhythm.  ABDOMEN:  Soft and nontender.  EXTREMITIES:  No clubbing, cyanosis, or edema but is tender all over.  NEUROLOGIC:  Very drowsy but responsive and cooperative with most of the exam. Answers "I dont know" to most questions.  She is oriented x 2.  Cerebellar is intact, and deep tendon reflex exam is variable.  In attempting to let the patients hand drop on her face, she would move her hand so that it would not hit her face.  INITIAL LABORATORY WORK:  Chest x-ray which was negative.  CT head was negative.  Dilantin level 12.9, carbamazepine 4.8, and the rest of her labs were normal.  HOSPITAL COURSE: #1 - SEIZURES:  Dr. Thad Ranger was notified of the admission, and he had recently added Tegretol to the patients Dilantin several days before admission.  After seeing her, he increased her Tegretol to the above-listed dose.  An EEG was done which was normal.  An MRI of the brain was done which was also normal.  On the day of discharge, the patient was alert, oriented, and back to baseline.  The patient will follow up with  Dr. Thad Ranger.  #2 - HEADACHES:  These were relieved by ibuprofen.  Again, MRI of the brain to rule out intracranial mass or other abnormality was negative.  The patients headaches are likely tension but may be migraine.  Again, she will follow up with Dr. Thad Ranger.  #3 - HISTORY OF CERVICAL CANCER/UTERINE CANCER ?:  The patient will follow up with family planning.  We will also schedule the patient for an appointment in outpatient clinic on the 7th of February at 2 p.m. for hospital follow-up and to ensure that these issues are dealt with.  #4 - INSOMNIA:  The patient relates not sleeping at all at home.   I have prescribed her Ambien 5 mg p.o. q.h.s. to use at home #10.  OTHER LABORATORY WORK THIS ADMISSION:  Prolactin level was drawn, but it was not 20 minutes after the seizure, but it was several hours after the patient was here without seizure activity.  It was 11.9 which is within normal limits. Urine drug screen was negative, and repeat carbamazepine level was 4.0, and repeat Dilantin level was 11.3.  Again, the patient will follow up in the outpatient clinic and with Dr. Thad Ranger. DD:  01/12/01 TD:  01/12/01 Job: [id number]

## 2011-04-28 NOTE — Discharge Summary (Signed)
NAMEJAZLINE, Nicole [name] NO.:  [id number]   MEDICAL RECORD NO.:  [id number]          PATIENT TYPE:  INP   LOCATION:  3738                         FACILITY:  [facility]   PHYSICIAN:  Osvaldo Shipper. Spruill, M.D.DATE OF BIRTH:  [birth year]   DATE OF ADMISSION:  09/28/2007  DATE OF DISCHARGE:  10/03/2007                               DISCHARGE SUMMARY   DISCHARGE DIAGNOSES:  1. Epilepsy.  2. Tobacco use disorder.  3. History of noncompliance.  4. Contusion of the knee.  5. Anxiety.   Nicole [name] is a 35 year old patient who presented [facility] to the [facility], the emergency department, with complaint of seizure.   Nicole [name] has a longstanding history of a seizure disorder.  She was  admitted after recurrent presentation to the emergency department.  It  is of note she was in her usual state of health, had [name]  until approximately 2 weeks prior to this admission.  She ran out of her  Tegretol medication.  Approximately 5 days prior to this admission, she  began having seizures of a grand mal type.  She presented to the  emergency department and was given a bolus of her Tegretol medication  and a prescription.  She went home and again had more seizures, having  not obtained the medication.  She was seen approximately 24 hours prior  to today's emergency department visit.  She was given more medication  and advised to go to the [employer] for the 4 dollar prescription plan.  She subsequently went home and today, on the day of admission, while on  her job, she had yet another seizure.   Initial history and physical was obtained by Dr. Willey Blade.  The  physical examination revealed the vital signs to be essentially within  normal limits.  On the neck examination, there were a few small cervical  nodes palpable and there were decreased breath sounds at the bases but  no wheezes or rubs appreciated.  The cardiac exam revealed a normal S1-  S2, no murmur or rubs  were appreciated.  On the musculoskeletal  examination, it is of note that she had pain and limitation of movement  of the right knee with increased pain with attempts at movement.  Neurologically, the patient was somewhat sluggish in her answers but  seemed to be oriented.  There were no gross neurologic deficits  appreciated.  The chemistries were essentially within normal limits.  The carbamazepine level was less than 2.  A CT scan was done of the head  approximately 48 hours prior to this evaluation and it demonstrated  bilateral frontal sinus disease but no evidence of bleed or recent head  trauma.  Chest x-ray revealed no active disease.   The patient was admitted to the medical service where she could be  reloaded with her Tegretol and her Tegretol and Dilantin be brought up  to therapeutic range.  Neurologic evaluation was also to be evaluated as  well as an x-ray of the knee to rule out fracture or other  complications.   The  patient was seen on neurology consultation.  It was their opinion  the patient was having breakthrough seizures secondary to noncompliance.  An EEG was to be evaluated.   The EEG was normal per Neurology.  There was then a problem with  constipation.  Plans were made to try Go-[facility].  On 10/23, it was the  opinion that the patient had received maximum benefit from this  hospitalization.  There were no further seizures appreciated.  The  patient to be followed closely as an outpatient.  The patient to be seen  in the office in 1 week.   MEDICATIONS AT DISCHARGE INCLUDE:  1. Tegretol 200 mg b.i.d.  2. Dilantin 100 mg q.i.d.   The patient can return to work after seeing Dr. Shana Chute in the office in  1 week.  Family is to notify the physician if any changes, problems or  concerns.      Nicole [name], P.A.      Osvaldo Shipper. Spruill, M.D.  Electronically Signed    HB/MEDQ  D:  10/30/2007  T:  10/31/2007  Job:  [id number]
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
## 2011-04-29 IMAGING — CR DG CHEST 2V
2 series · 2 of 2 positions shown · non-contrast
Comparison: 12/17/2009

CLINICAL DATA: Chest pain and shortness of breath.  Asthma.
Smoker.

CHEST - 2 VIEW

[w chest pa]
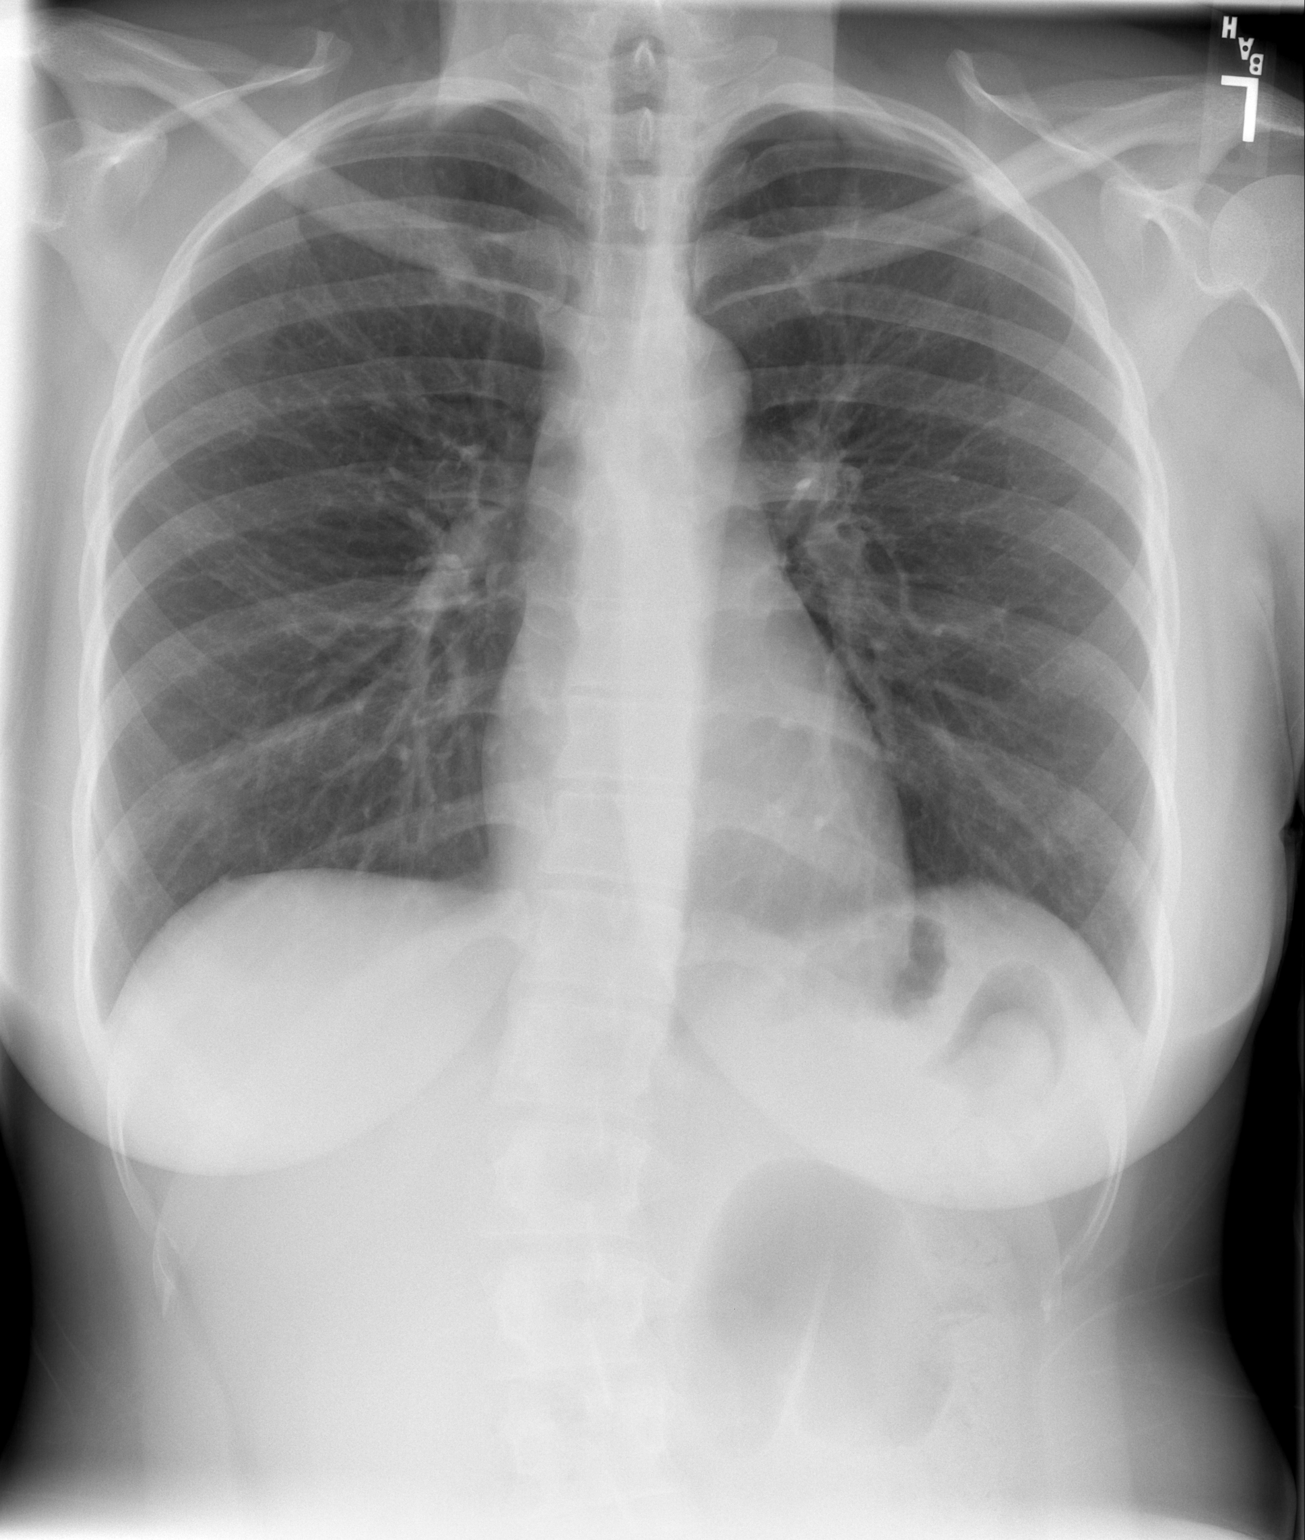

[w chest lat]
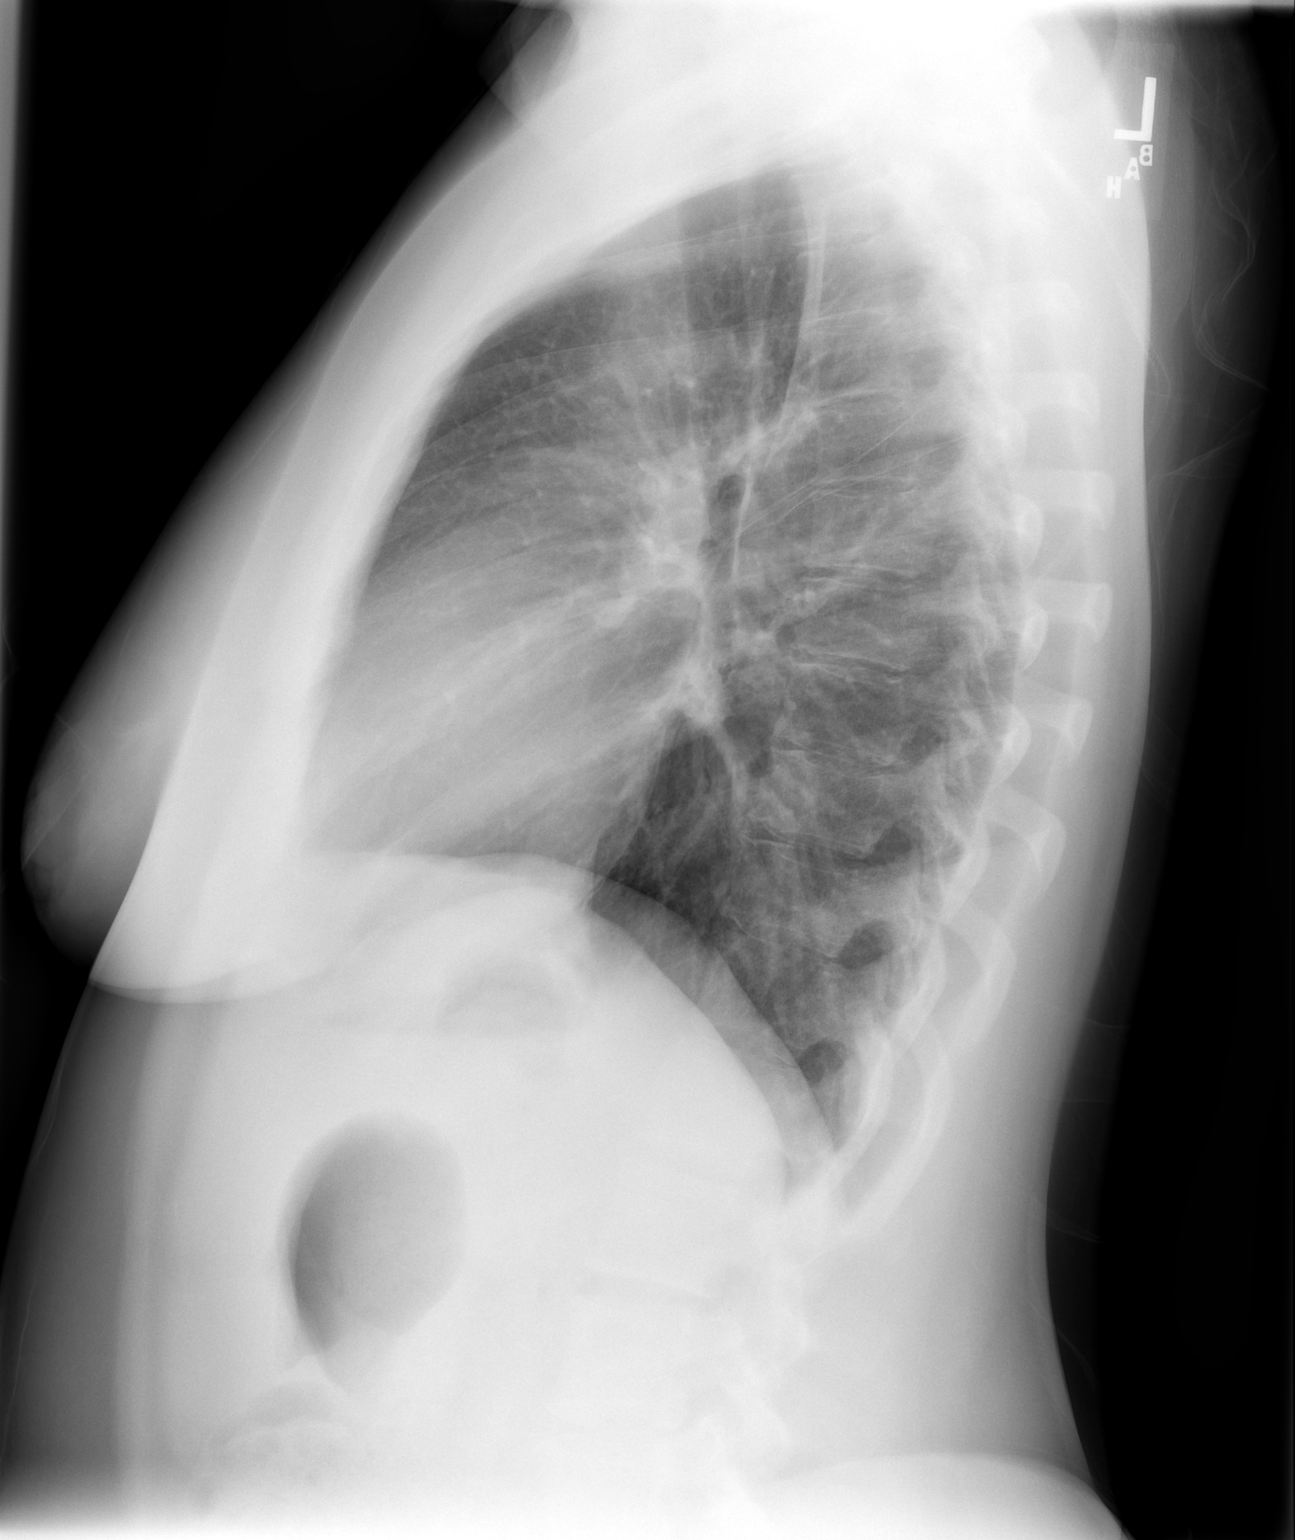

[2 of 2 positions shown; findings below may reference images not displayed]

FINDINGS: The heart size and mediastinal contours are within
normal limits.  Both lungs are clear.  The visualized skeletal
structures are unremarkable.
IMPRESSION: No active cardiopulmonary disease.

## 2011-05-03 ENCOUNTER — Inpatient Hospital Stay (INDEPENDENT_AMBULATORY_CARE_PROVIDER_SITE_OTHER)
Admission: RE | Admit: 2011-05-03 | Discharge: 2011-05-03 | Disposition: A | Discharge status: Home / Self Care | Payer: BC Managed Care – PPO | Source: Ambulatory Visit | Attending: Emergency Medicine | Admitting: Emergency Medicine

## 2011-05-03 DIAGNOSIS — J309 Allergic rhinitis, unspecified: Secondary | ICD-10-CM

## 2011-05-03 DIAGNOSIS — J019 Acute sinusitis, unspecified: Secondary | ICD-10-CM

## 2011-05-03 LAB — POCT URINALYSIS DIP (DEVICE)
Protein, ur: NEGATIVE mg/dL
Specific Gravity, Urine: 1.015 (ref 1.005–1.030)
Urobilinogen, UA: 0.2 mg/dL (ref 0.0–1.0)

## 2011-05-06 IMAGING — CR DG CHEST 2V
2 series · 2 of 2 positions shown · non-contrast
Comparison: None

CLINICAL DATA: Cough.  Congestion.  Possible pneumonia.

CHEST - 2 VIEW

[w chest pa]
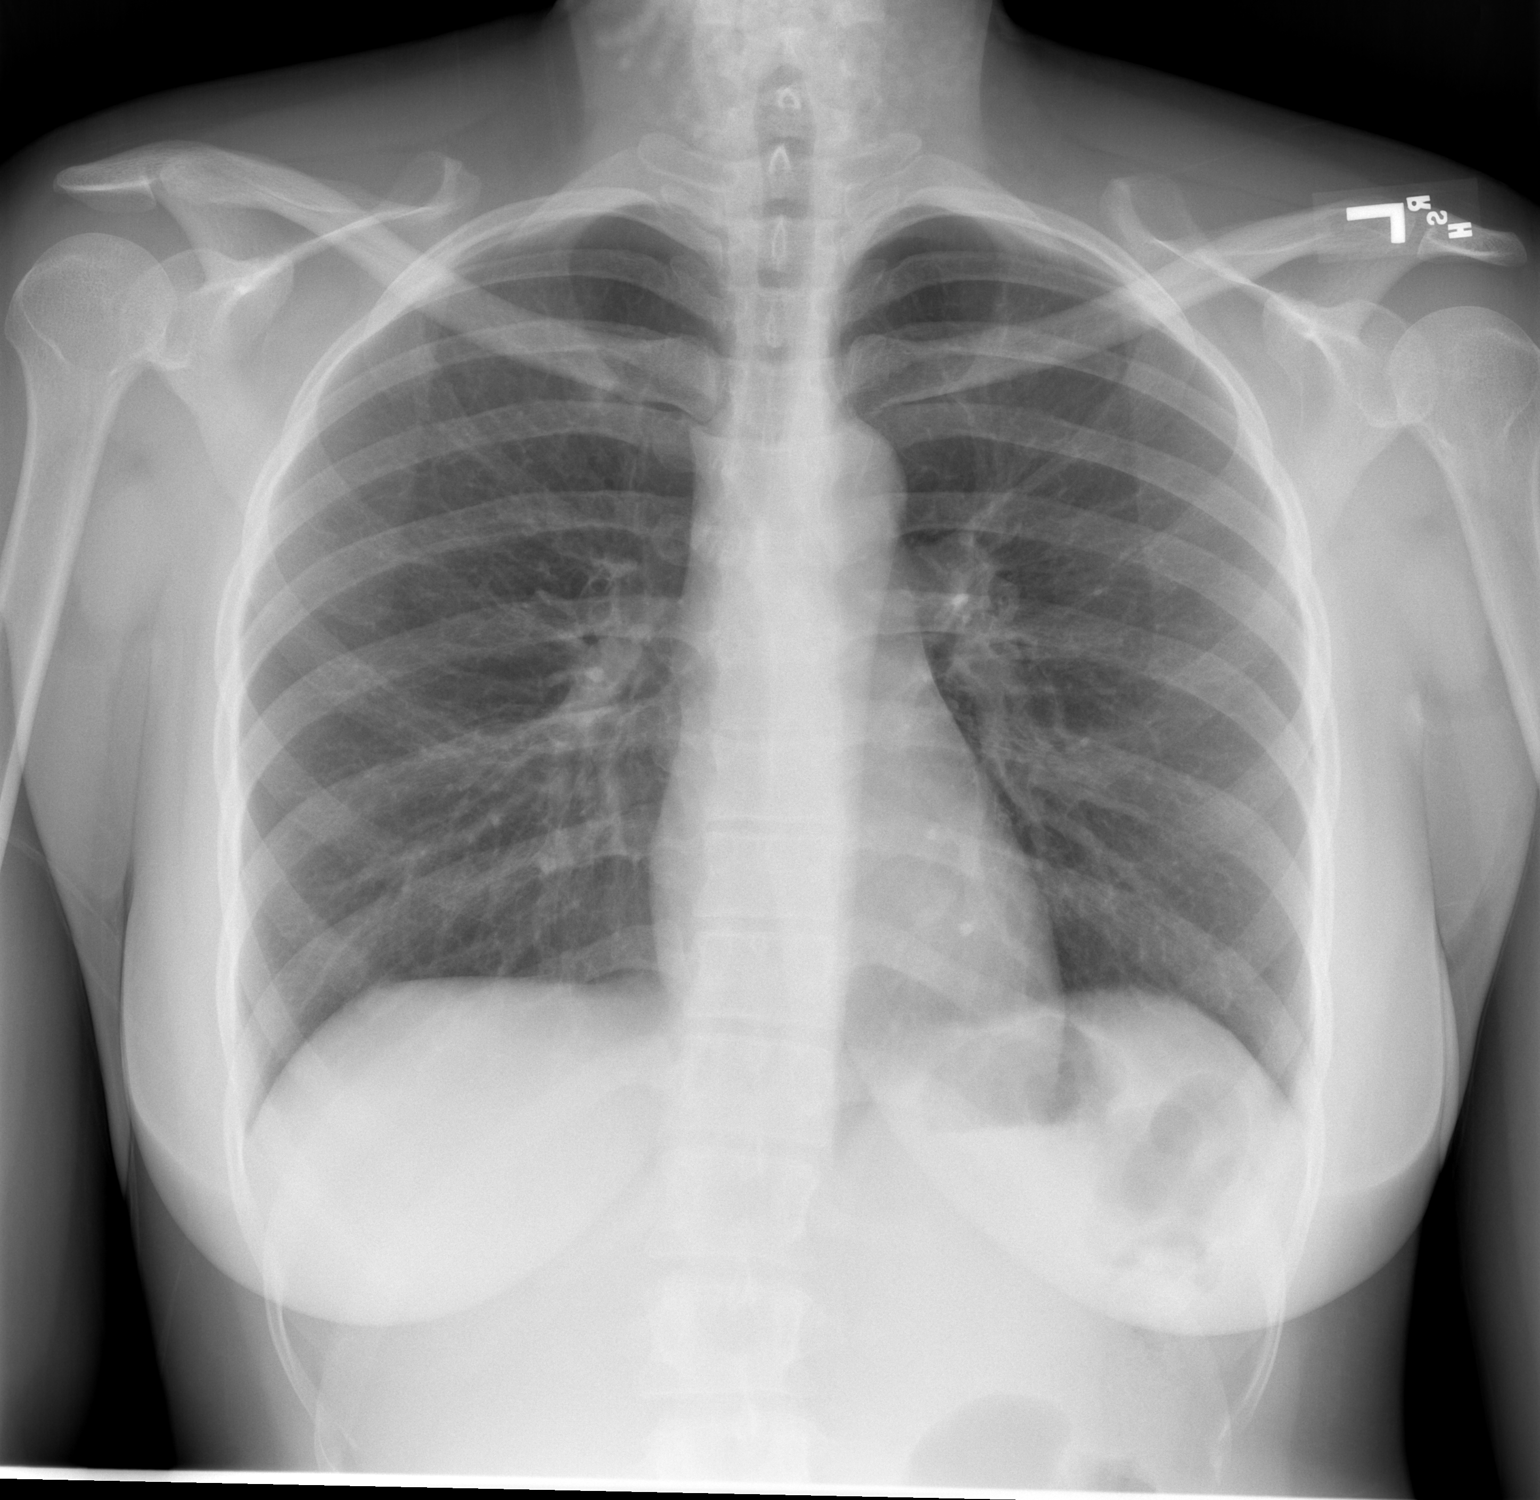

[w chest lat]
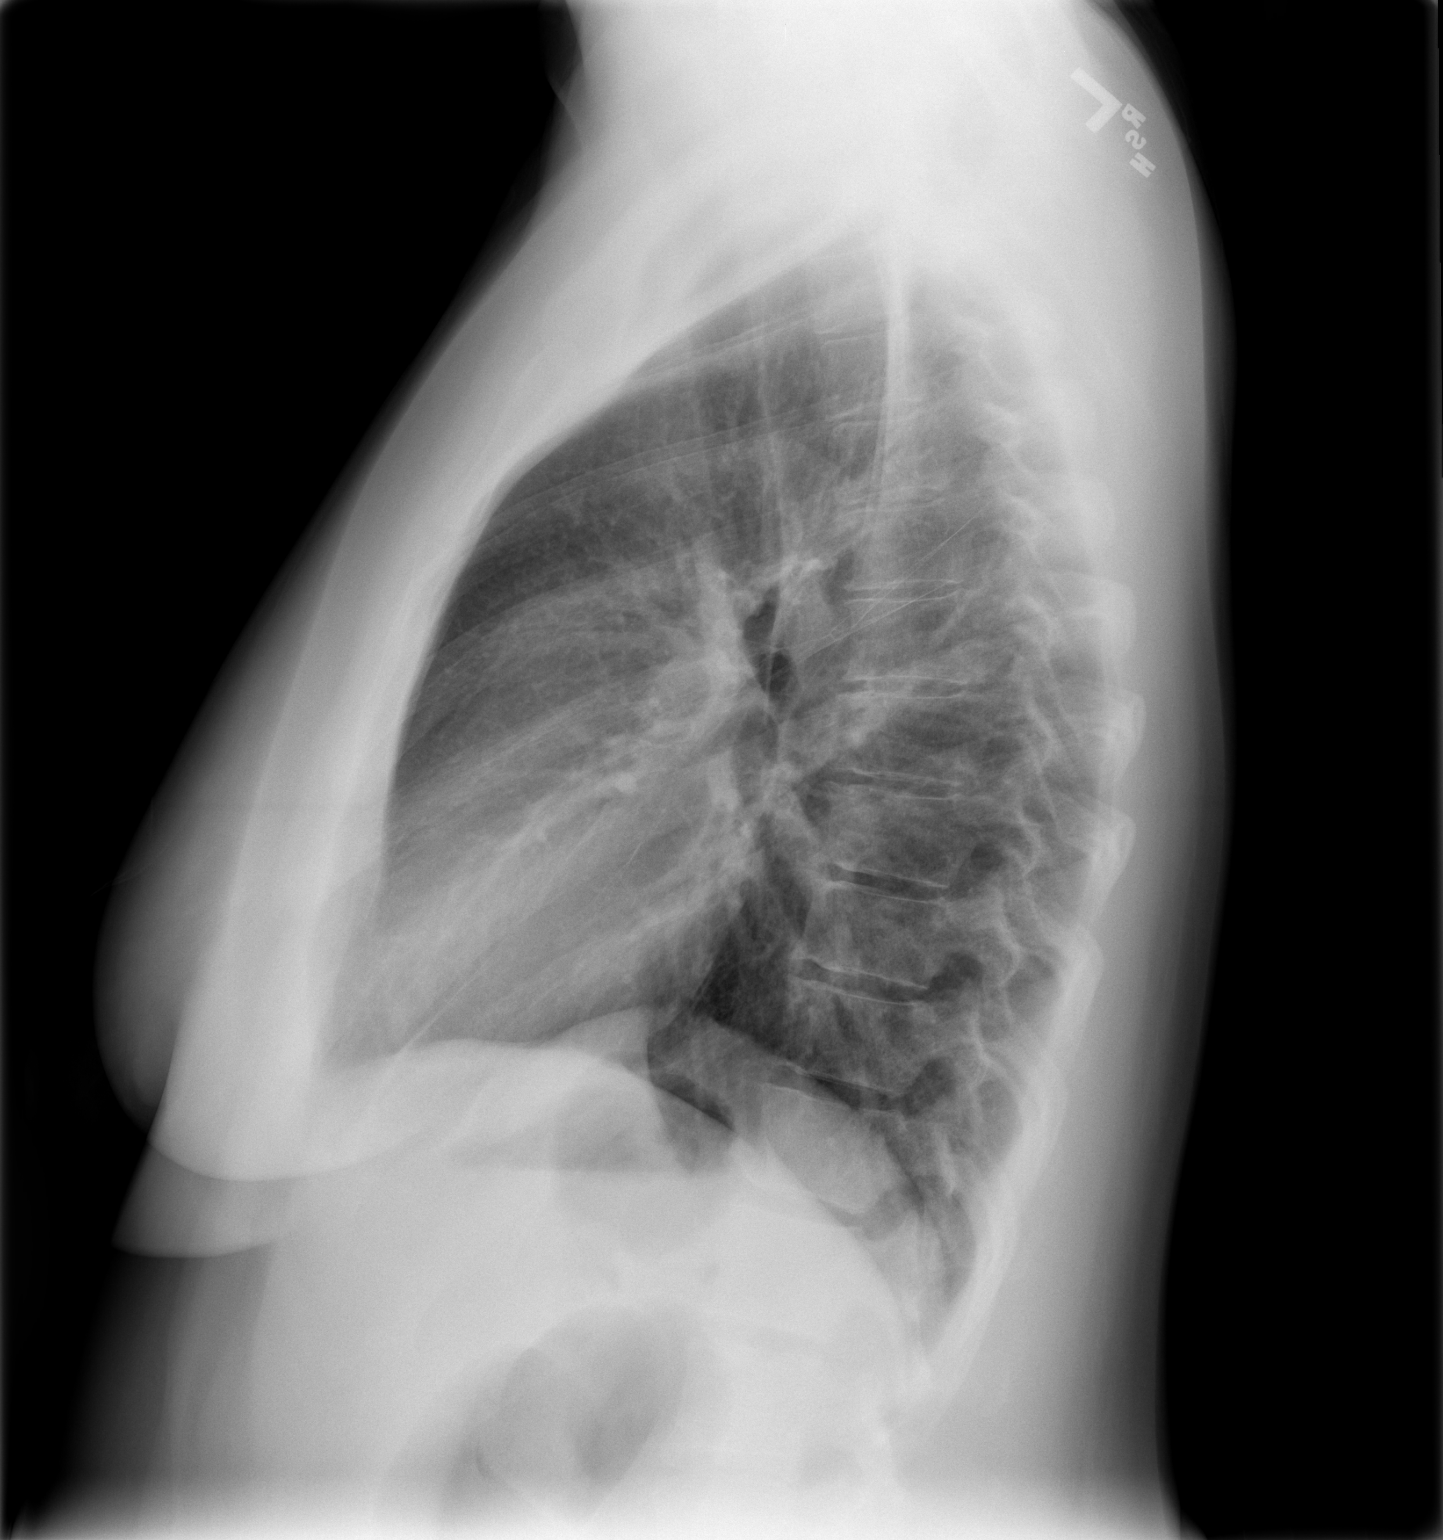

[2 of 2 positions shown; findings below may reference images not displayed]

FINDINGS: Heart size is normal.  Mediastinal shadows are normal.
Lungs are clear.  No definite bronchitis.  No infiltrate, mass,
effusion or collapse.  No bony abnormality.
IMPRESSION: Normal chest

## 2011-05-27 IMAGING — CT CT HEAD W/O CM
1 series · 16 of 30 positions shown, 20 images · non-contrast
Comparison: 07/10/2010

CLINICAL DATA: Headaches, migraines

CT HEAD WITHOUT CONTRAST
TECHNIQUE: Contiguous axial images were obtained from the base of
the skull through the vertex without contrast

[Series 2: head routine 4.8 h37s · axial · 0.44mm/px · z∈[-202,-69]mm · 16 of 30 slices shown, 20 images]
[im 2/30  brain]
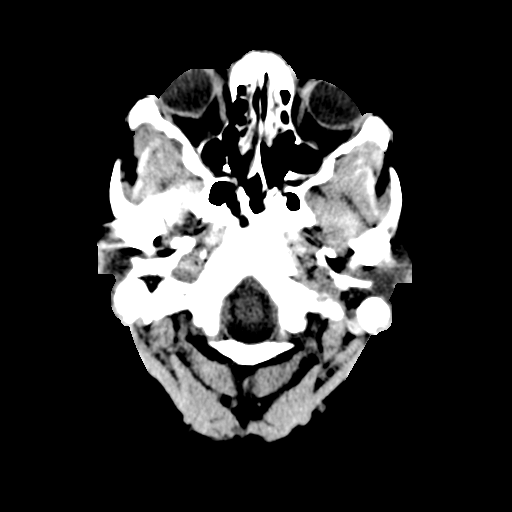
[im 2/30  bone]
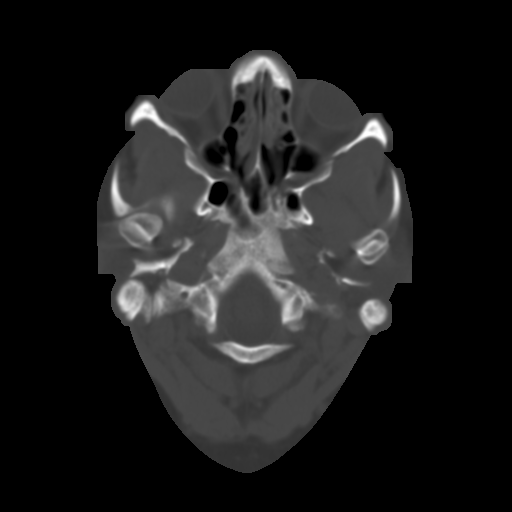
[im 4/30  brain]
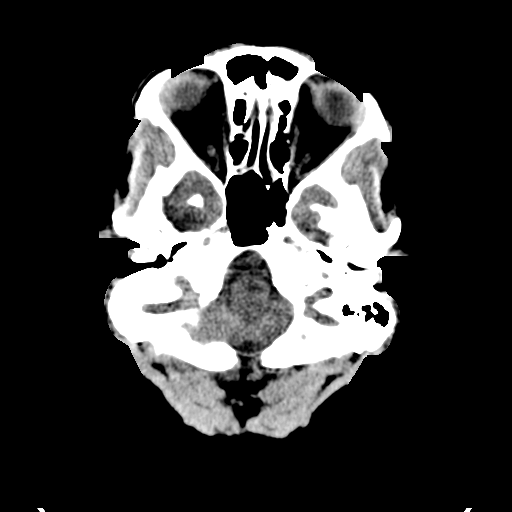
[im 6/30  brain]
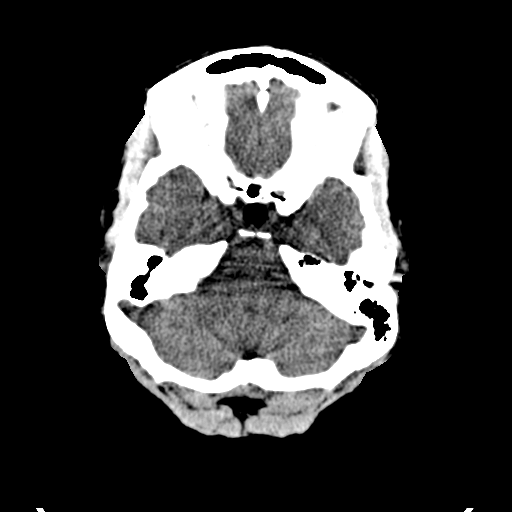
[im 8/30  brain]
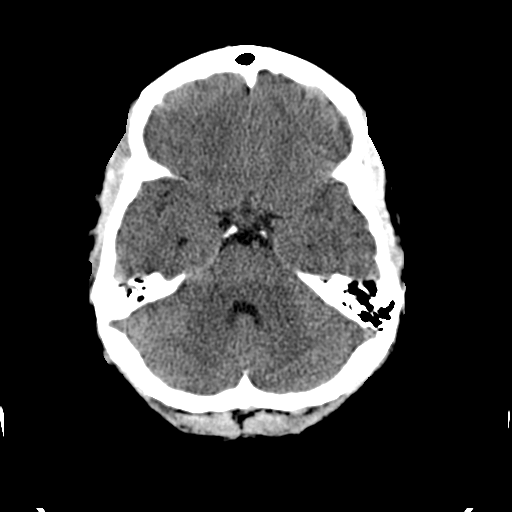
[im 9/30  brain]
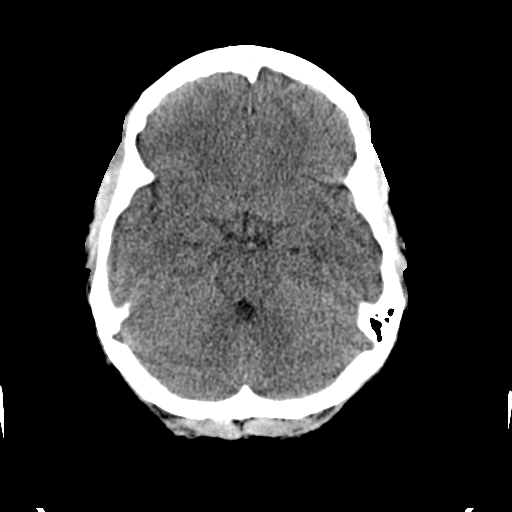
[im 9/30  bone]
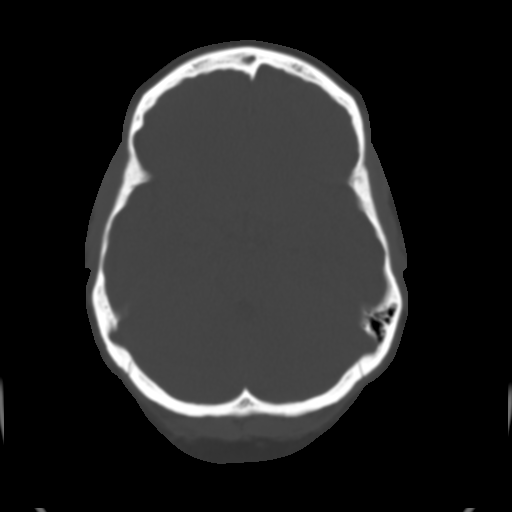
[im 11/30  brain]
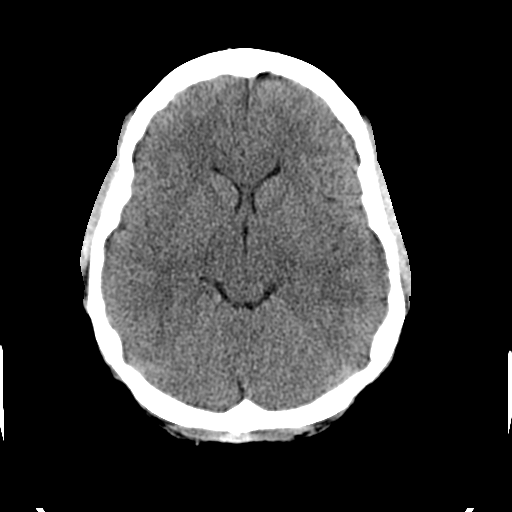
[im 13/30  brain]
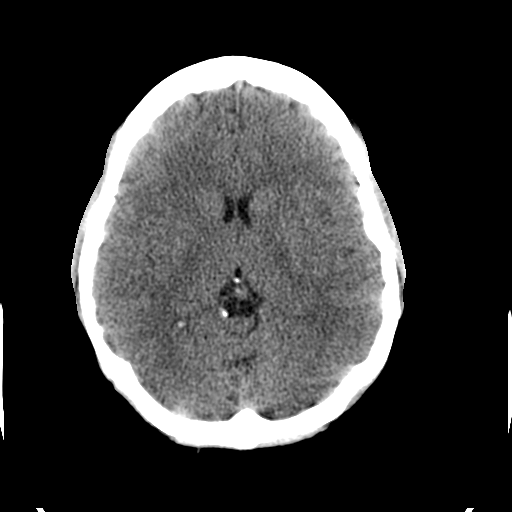
[im 15/30  brain]
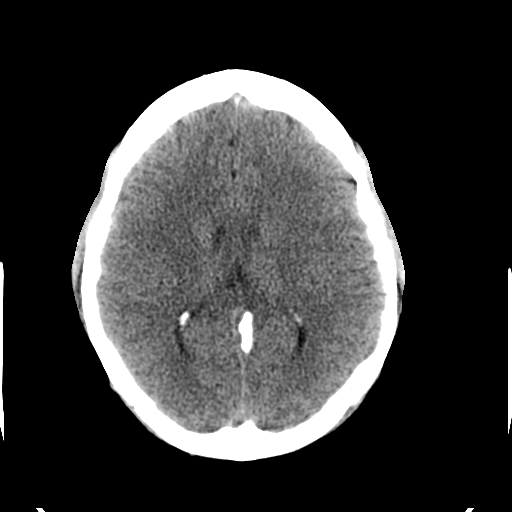
[im 16/30  brain]
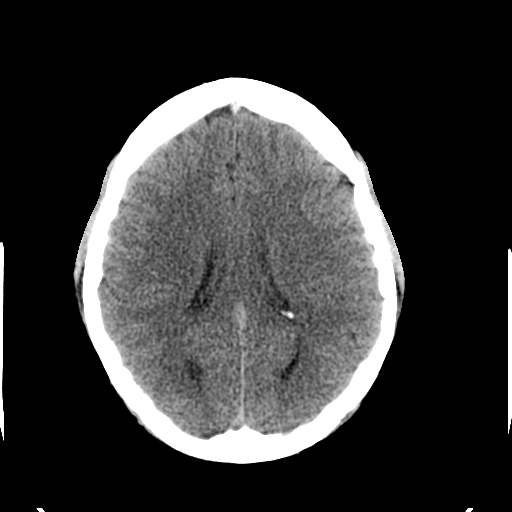
[im 16/30  bone]
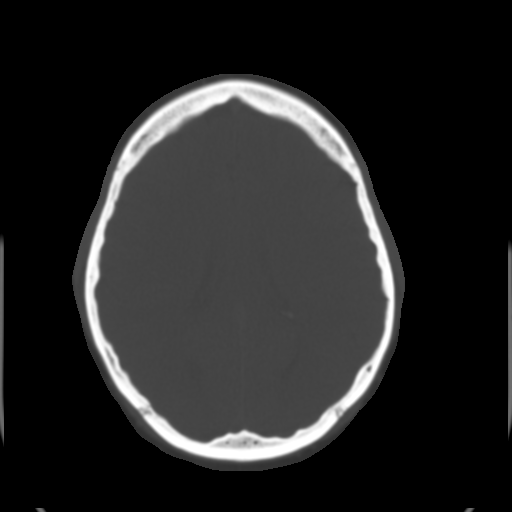
[im 18/30  brain]
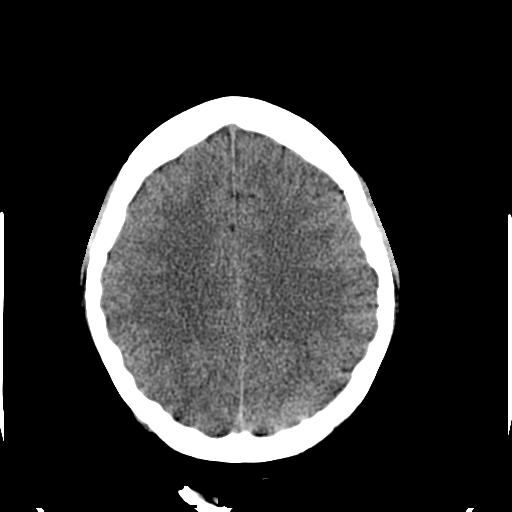
[im 20/30  brain]
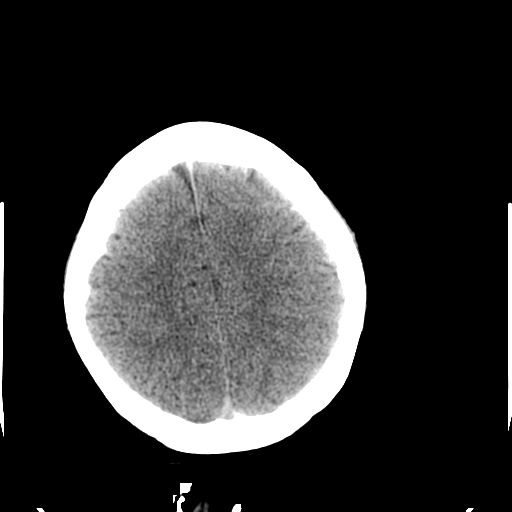
[im 22/30  brain]
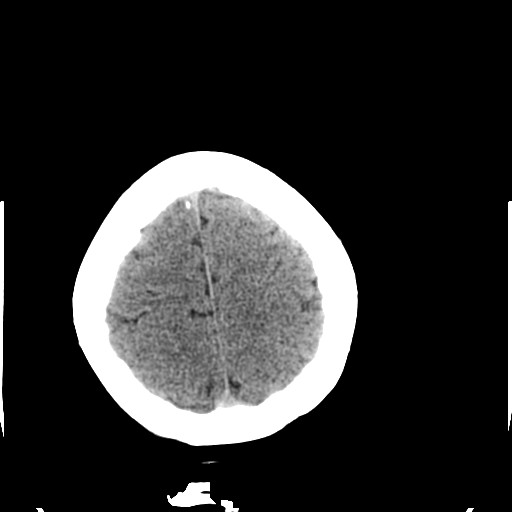
[im 23/30  brain]
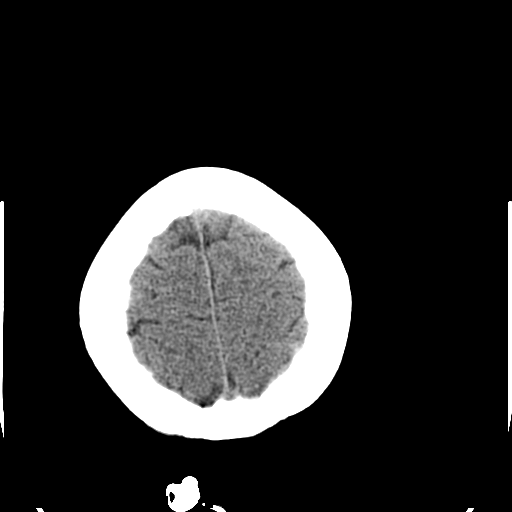
[im 23/30  bone]
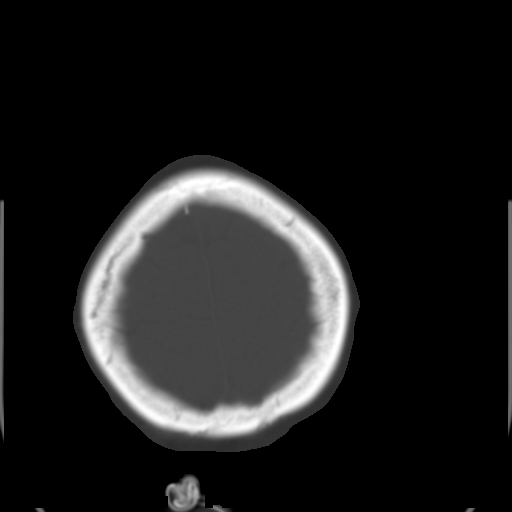
[im 25/30  brain]
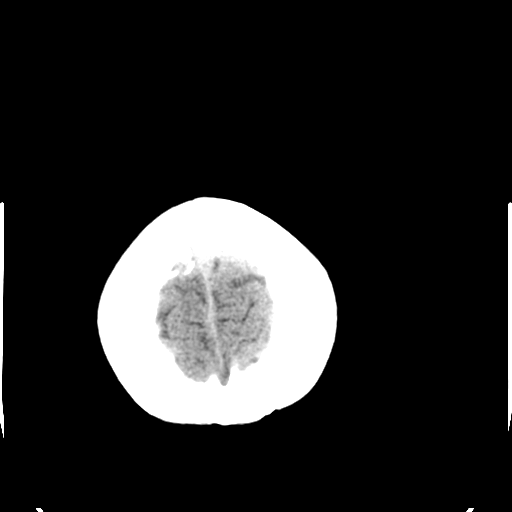
[im 27/30  brain]
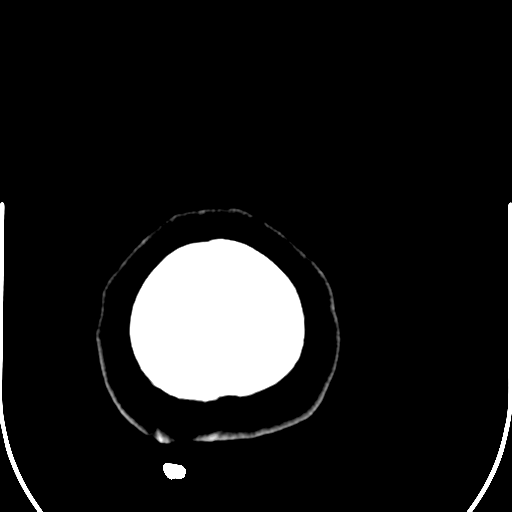
[im 29/30  brain]
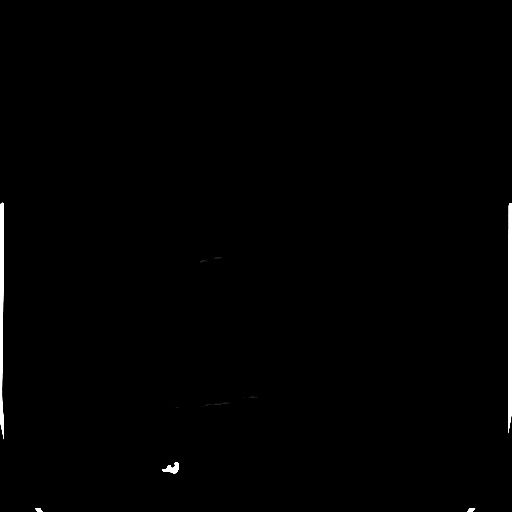

[16 of 30 positions shown; findings below may reference images not displayed]

FINDINGS: The brain has a normal appearance without evidence for
hemorrhage, acute infarction, hydrocephalus, or mass lesion.  There
is no extra axial fluid collection.  The skull is normal.  There is
moderate ethmoid and maxillary sinus disease with air-fluid level
on the right.  This is worse from priors.  The brain however is
unchanged.
IMPRESSION: No acute intracranial findings.  Sinusitis.

## 2011-06-18 ENCOUNTER — Emergency Department (HOSPITAL_COMMUNITY)
Admission: EM | Admit: 2011-06-18 | Discharge: 2011-06-18 | Disposition: A | Discharge status: Home / Self Care | Payer: Self-pay | Attending: Emergency Medicine | Admitting: Emergency Medicine

## 2011-06-18 DIAGNOSIS — J45909 Unspecified asthma, uncomplicated: Secondary | ICD-10-CM | POA: Insufficient documentation

## 2011-06-18 DIAGNOSIS — G43909 Migraine, unspecified, not intractable, without status migrainosus: Secondary | ICD-10-CM | POA: Insufficient documentation

## 2011-06-18 DIAGNOSIS — G40802 Other epilepsy, not intractable, without status epilepticus: Secondary | ICD-10-CM | POA: Insufficient documentation

## 2011-06-18 DIAGNOSIS — Z79899 Other long term (current) drug therapy: Secondary | ICD-10-CM | POA: Insufficient documentation

## 2011-06-18 LAB — URINALYSIS, ROUTINE W REFLEX MICROSCOPIC
Glucose, UA: NEGATIVE mg/dL
Protein, ur: NEGATIVE mg/dL
Specific Gravity, Urine: 1.015 (ref 1.005–1.030)
Urobilinogen, UA: 0.2 mg/dL (ref 0.0–1.0)

## 2011-06-18 LAB — URINE MICROSCOPIC-ADD ON

## 2011-07-05 IMAGING — CT CT HEAD W/O CM
2 series · 16 of 30 positions shown, 20 images · non-contrast
Comparison: CT 09/28/2010

CLINICAL DATA: Headaches and photophobia

CT HEAD WITHOUT CONTRAST
TECHNIQUE: Contiguous axial images were obtained from the base of
the skull through the vertex without contrast.

[Series 2: head w/o · axial · non-contrast · 0.50mm/px · z∈[+77,+197]mm · 13 of 29 slices shown, 17 images]
[im 3/29  brain]
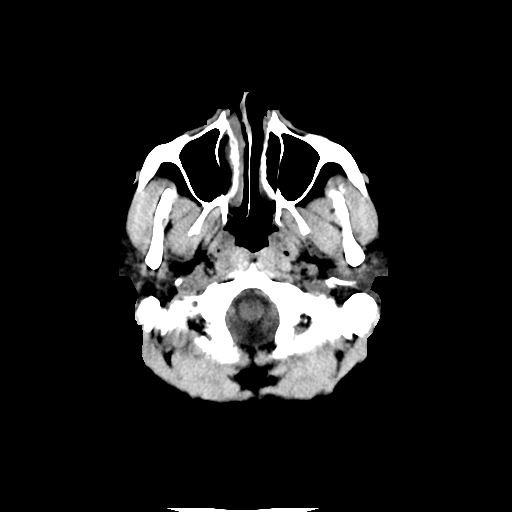
[im 3/29  bone]
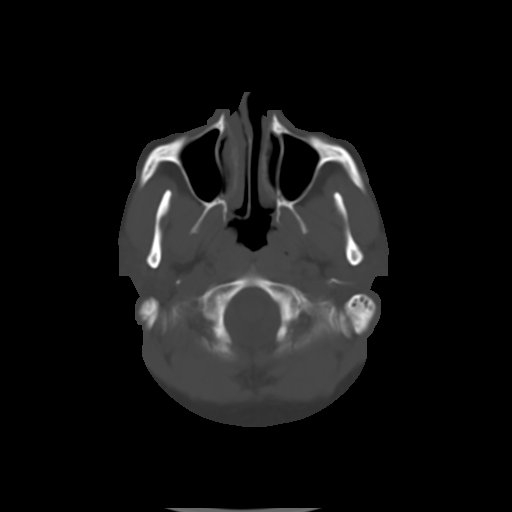
[im 5/29  brain]
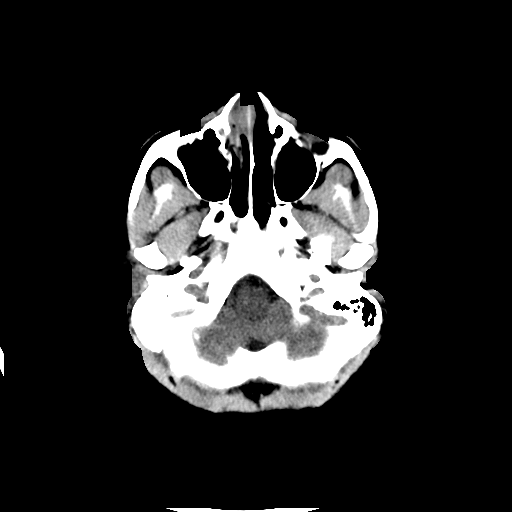
[im 7/29  brain]
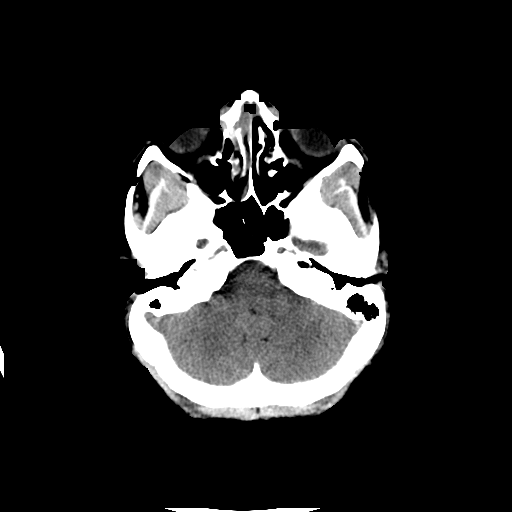
[im 9/29  brain]
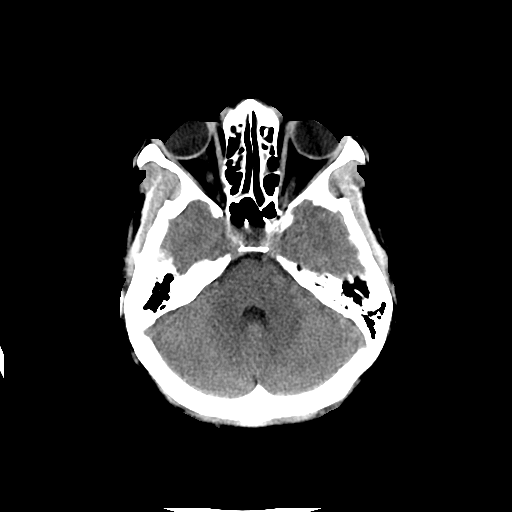
[im 11/29  brain]
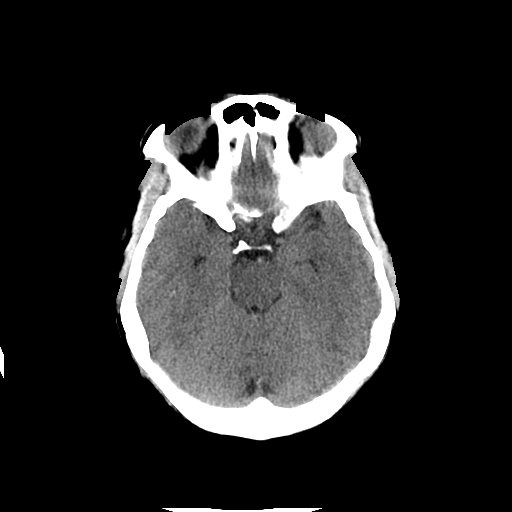
[im 11/29  bone]
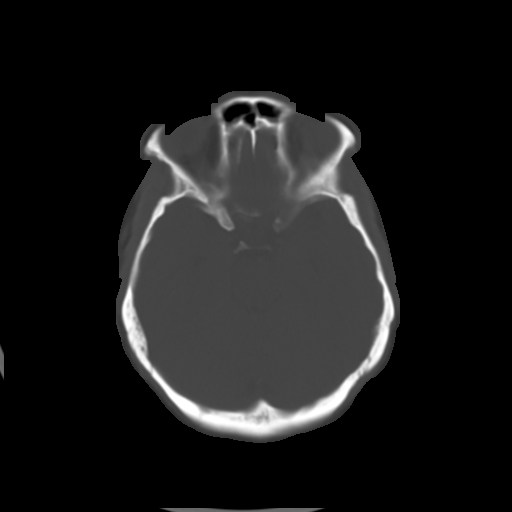
[im 13/29  brain]
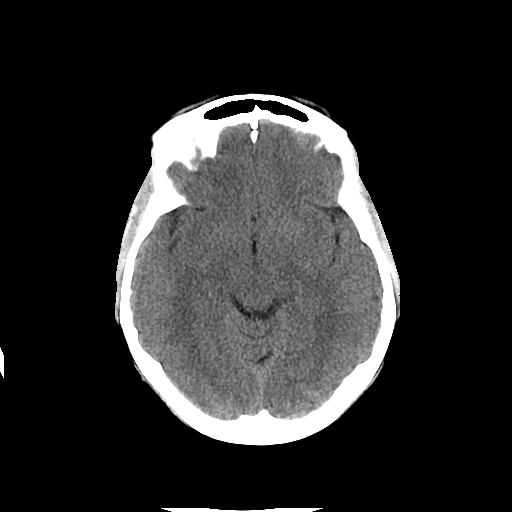
[im 15/29  brain]
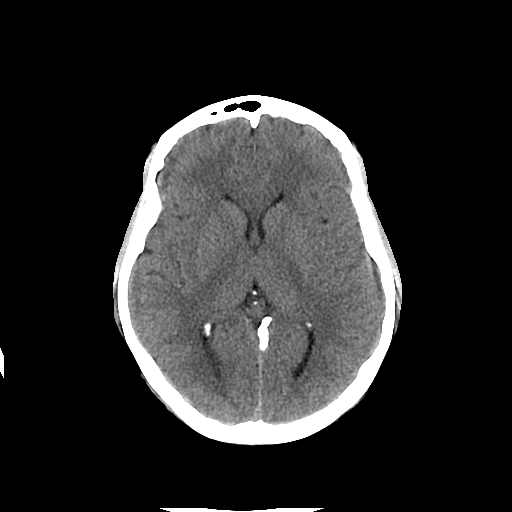
[im 17/29  brain]
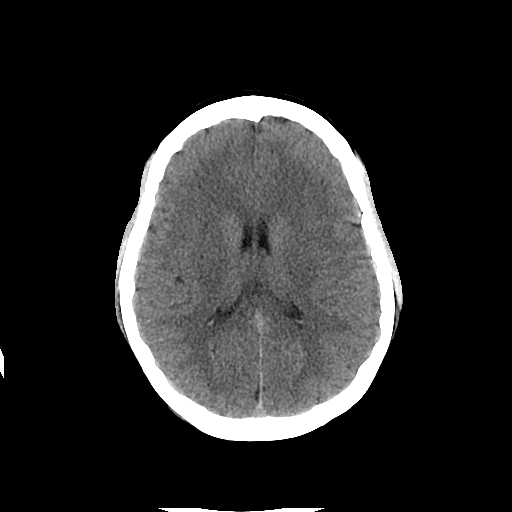
[im 19/29  brain]
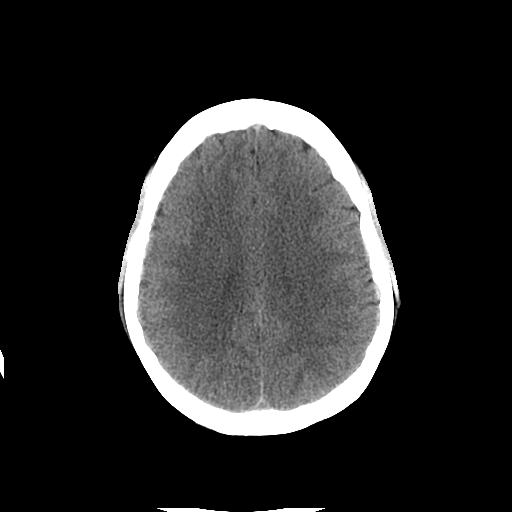
[im 19/29  bone]
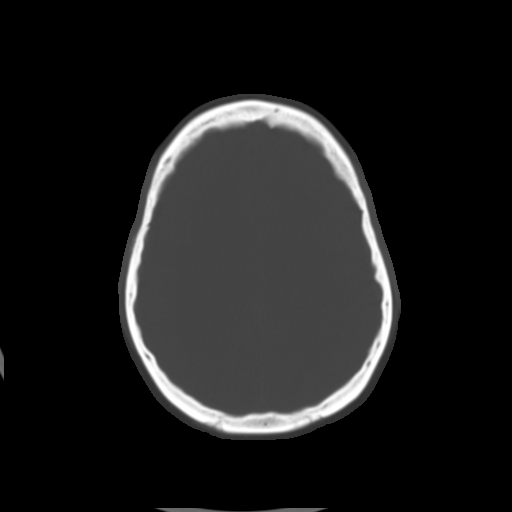
[im 21/29  brain]
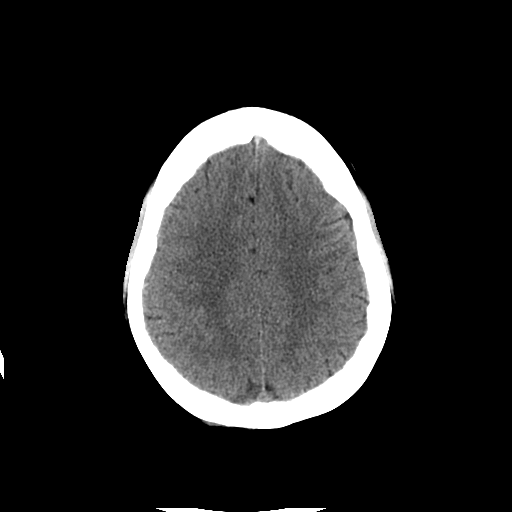
[im 23/29  brain]
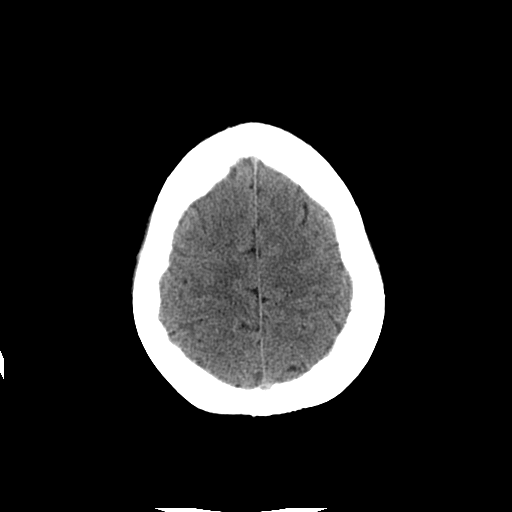
[im 25/29  brain]
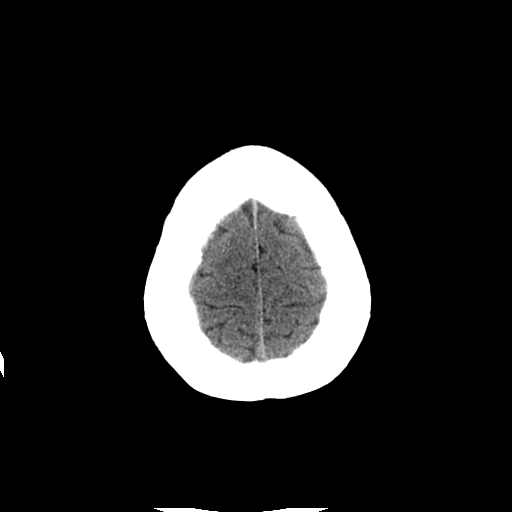
[im 27/29  brain]
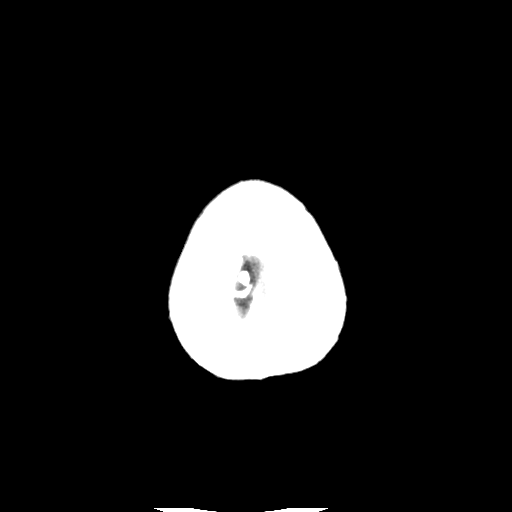
[im 27/29  bone]
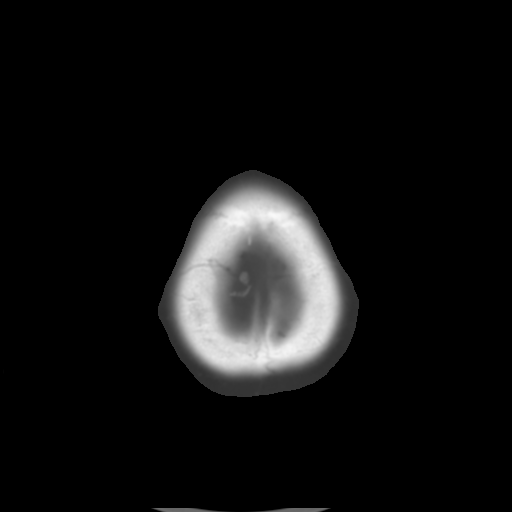

[Series 3: head w/o bone · axial · non-contrast · 0.50mm/px · z∈[+77,+117]mm · 3 of 29 slices shown]
[im 3/29  bone]
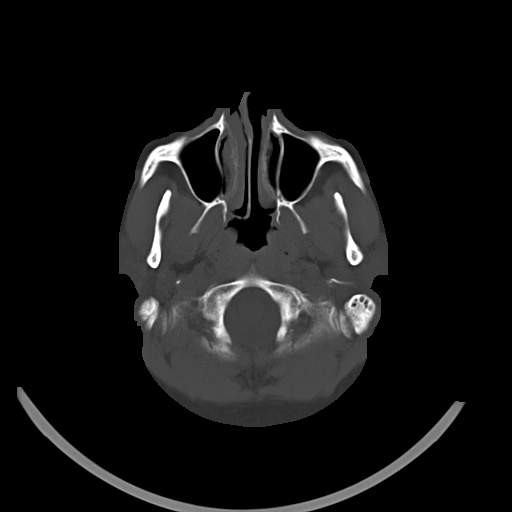
[im 7/29  bone]
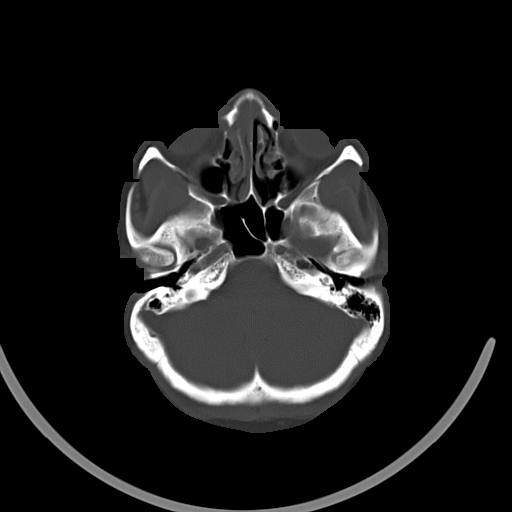
[im 11/29  bone]
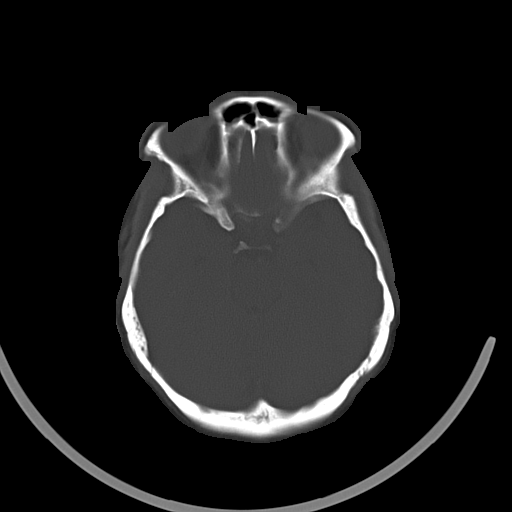

[16 of 30 positions shown; findings below may reference images not displayed]

FINDINGS: Ventricles are normal.  Negative for intracranial
hemorrhage.  Negative for infarct or mass.  White matter is normal.

Mild chronic sinusitis involving the ethmoid sinuses.  Maxillary
sinuses have cleared since the prior study when there was an air-
fluid level on the left.  Calvarium is intact.
IMPRESSION: No significant intracranial abnormality.

Improvement in sinusitis since 09/28/2010.

## 2011-07-07 ENCOUNTER — Emergency Department (HOSPITAL_COMMUNITY): Payer: Self-pay

## 2011-07-07 ENCOUNTER — Emergency Department (HOSPITAL_COMMUNITY)
Admission: EM | Admit: 2011-07-07 | Discharge: 2011-07-07 | Disposition: A | Discharge status: Home / Self Care | Payer: Self-pay | Attending: Emergency Medicine | Admitting: Emergency Medicine

## 2011-07-07 DIAGNOSIS — R0989 Other specified symptoms and signs involving the circulatory and respiratory systems: Secondary | ICD-10-CM | POA: Insufficient documentation

## 2011-07-07 DIAGNOSIS — R5381 Other malaise: Secondary | ICD-10-CM | POA: Insufficient documentation

## 2011-07-07 DIAGNOSIS — R6883 Chills (without fever): Secondary | ICD-10-CM | POA: Insufficient documentation

## 2011-07-07 DIAGNOSIS — R0602 Shortness of breath: Secondary | ICD-10-CM | POA: Insufficient documentation

## 2011-07-07 DIAGNOSIS — G40909 Epilepsy, unspecified, not intractable, without status epilepticus: Secondary | ICD-10-CM | POA: Insufficient documentation

## 2011-07-07 DIAGNOSIS — R110 Nausea: Secondary | ICD-10-CM | POA: Insufficient documentation

## 2011-07-07 DIAGNOSIS — D649 Anemia, unspecified: Secondary | ICD-10-CM | POA: Insufficient documentation

## 2011-07-07 DIAGNOSIS — R079 Chest pain, unspecified: Secondary | ICD-10-CM | POA: Insufficient documentation

## 2011-07-07 DIAGNOSIS — R51 Headache: Secondary | ICD-10-CM | POA: Insufficient documentation

## 2011-07-07 DIAGNOSIS — R0609 Other forms of dyspnea: Secondary | ICD-10-CM | POA: Insufficient documentation

## 2011-07-07 DIAGNOSIS — J45909 Unspecified asthma, uncomplicated: Secondary | ICD-10-CM | POA: Insufficient documentation

## 2011-07-07 LAB — TROPONIN I: Troponin I: <0.3 ng/mL (ref <0.30)

## 2011-07-07 LAB — RAPID URINE DRUG SCREEN, HOSP PERFORMED
Barbiturates: NOT DETECTED
Benzodiazepines: NOT DETECTED
Cocaine: NOT DETECTED

## 2011-07-07 LAB — POCT I-STAT, CHEM 8
Creatinine, Ser: 0.8 mg/dL (ref 0.50–1.10)
Glucose, Bld: 98 mg/dL (ref 70–99)
Hemoglobin: 9.5 g/dL — ABNORMAL LOW (ref 12.0–15.0)
Sodium: 140 mEq/L (ref 135–145)
TCO2: 24 mmol/L (ref 0–100)

## 2011-07-07 LAB — URINALYSIS, ROUTINE W REFLEX MICROSCOPIC
Bilirubin Urine: NEGATIVE
Ketones, ur: NEGATIVE mg/dL
Nitrite: NEGATIVE
Urobilinogen, UA: 1 mg/dL (ref 0.0–1.0)
pH: 7.5 (ref 5.0–8.0)

## 2011-07-07 LAB — CBC
MCHC: 31.8 g/dL (ref 30.0–36.0)
RDW: 15 % (ref 11.5–15.5)

## 2011-07-07 LAB — URINE MICROSCOPIC-ADD ON

## 2011-07-07 LAB — POCT PREGNANCY, URINE: Preg Test, Ur: NEGATIVE

## 2011-07-07 LAB — DIFFERENTIAL
Basophils Absolute: 0.1 10*3/uL (ref 0.0–0.1)
Basophils Relative: 1 % (ref 0–1)
Eosinophils Relative: 6 % — ABNORMAL HIGH (ref 0–5)
Monocytes Absolute: 0.6 10*3/uL (ref 0.1–1.0)

## 2011-07-21 ENCOUNTER — Emergency Department (HOSPITAL_COMMUNITY)
Admission: EM | Admit: 2011-07-21 | Discharge: 2011-07-21 | Disposition: A | Discharge status: Home / Self Care | Payer: Self-pay | Attending: Emergency Medicine | Admitting: Emergency Medicine

## 2011-07-21 ENCOUNTER — Emergency Department (HOSPITAL_COMMUNITY): Payer: Self-pay

## 2011-07-21 DIAGNOSIS — R569 Unspecified convulsions: Secondary | ICD-10-CM | POA: Insufficient documentation

## 2011-07-21 DIAGNOSIS — R059 Cough, unspecified: Secondary | ICD-10-CM | POA: Insufficient documentation

## 2011-07-21 DIAGNOSIS — R0982 Postnasal drip: Secondary | ICD-10-CM | POA: Insufficient documentation

## 2011-07-21 DIAGNOSIS — R509 Fever, unspecified: Secondary | ICD-10-CM | POA: Insufficient documentation

## 2011-07-21 DIAGNOSIS — R5383 Other fatigue: Secondary | ICD-10-CM | POA: Insufficient documentation

## 2011-07-21 DIAGNOSIS — J45909 Unspecified asthma, uncomplicated: Secondary | ICD-10-CM | POA: Insufficient documentation

## 2011-07-21 DIAGNOSIS — R5381 Other malaise: Secondary | ICD-10-CM | POA: Insufficient documentation

## 2011-07-21 DIAGNOSIS — R05 Cough: Secondary | ICD-10-CM | POA: Insufficient documentation

## 2011-07-21 LAB — POCT I-STAT, CHEM 8
BUN: 5 mg/dL — ABNORMAL LOW (ref 6–23)
Calcium, Ion: 1.17 mmol/L (ref 1.12–1.32)
Glucose, Bld: 87 mg/dL (ref 70–99)
TCO2: 23 mmol/L (ref 0–100)

## 2011-07-21 LAB — CARBAMAZEPINE LEVEL, TOTAL: Carbamazepine Lvl: 3.3 ug/mL — ABNORMAL LOW (ref 4.0–12.0)

## 2011-07-21 LAB — CBC
MCH: 24.2 pg — ABNORMAL LOW (ref 26.0–34.0)
MCHC: 31.5 g/dL (ref 30.0–36.0)
Platelets: 259 10*3/uL (ref 150–400)
RBC: 3.76 MIL/uL — ABNORMAL LOW (ref 3.87–5.11)
RDW: 16.3 % — ABNORMAL HIGH (ref 11.5–15.5)

## 2011-07-21 LAB — DIFFERENTIAL
Basophils Relative: 1 % (ref 0–1)
Eosinophils Absolute: 0.5 10*3/uL (ref 0.0–0.7)
Eosinophils Relative: 8 % — ABNORMAL HIGH (ref 0–5)
Monocytes Absolute: 0.6 10*3/uL (ref 0.1–1.0)
Monocytes Relative: 9 % (ref 3–12)
Neutrophils Relative %: 55 % (ref 43–77)

## 2011-07-30 ENCOUNTER — Emergency Department (HOSPITAL_COMMUNITY)
Admission: EM | Admit: 2011-07-30 | Discharge: 2011-07-31 | Disposition: A | Discharge status: Home / Self Care | Payer: Self-pay | Attending: Emergency Medicine | Admitting: Emergency Medicine

## 2011-07-30 DIAGNOSIS — N760 Acute vaginitis: Secondary | ICD-10-CM | POA: Insufficient documentation

## 2011-07-30 DIAGNOSIS — N72 Inflammatory disease of cervix uteri: Secondary | ICD-10-CM | POA: Insufficient documentation

## 2011-07-30 DIAGNOSIS — R112 Nausea with vomiting, unspecified: Secondary | ICD-10-CM | POA: Insufficient documentation

## 2011-07-30 DIAGNOSIS — B9689 Other specified bacterial agents as the cause of diseases classified elsewhere: Secondary | ICD-10-CM | POA: Insufficient documentation

## 2011-07-30 DIAGNOSIS — D649 Anemia, unspecified: Secondary | ICD-10-CM | POA: Insufficient documentation

## 2011-07-30 DIAGNOSIS — I499 Cardiac arrhythmia, unspecified: Secondary | ICD-10-CM | POA: Insufficient documentation

## 2011-07-30 DIAGNOSIS — G40909 Epilepsy, unspecified, not intractable, without status epilepticus: Secondary | ICD-10-CM | POA: Insufficient documentation

## 2011-07-30 DIAGNOSIS — R109 Unspecified abdominal pain: Secondary | ICD-10-CM | POA: Insufficient documentation

## 2011-07-30 DIAGNOSIS — J45909 Unspecified asthma, uncomplicated: Secondary | ICD-10-CM | POA: Insufficient documentation

## 2011-07-30 DIAGNOSIS — A499 Bacterial infection, unspecified: Secondary | ICD-10-CM | POA: Insufficient documentation

## 2011-07-31 LAB — WET PREP, GENITAL
Trich, Wet Prep: NONE SEEN
Yeast Wet Prep HPF POC: NONE SEEN

## 2011-07-31 LAB — CBC
HCT: 28.5 % — ABNORMAL LOW (ref 36.0–46.0)
Hemoglobin: 9.2 g/dL — ABNORMAL LOW (ref 12.0–15.0)
MCH: 24.9 pg — ABNORMAL LOW (ref 26.0–34.0)
MCHC: 32.3 g/dL (ref 30.0–36.0)
RBC: 3.69 MIL/uL — ABNORMAL LOW (ref 3.87–5.11)

## 2011-07-31 LAB — POCT I-STAT, CHEM 8
Chloride: 105 mEq/L (ref 96–112)
Creatinine, Ser: 0.7 mg/dL (ref 0.50–1.10)
Glucose, Bld: 93 mg/dL (ref 70–99)
HCT: 30 % — ABNORMAL LOW (ref 36.0–46.0)
Potassium: 3.7 mEq/L (ref 3.5–5.1)
Sodium: 140 mEq/L (ref 135–145)

## 2011-07-31 LAB — URINALYSIS, ROUTINE W REFLEX MICROSCOPIC
Bilirubin Urine: NEGATIVE
Hgb urine dipstick: NEGATIVE
Ketones, ur: NEGATIVE mg/dL
Nitrite: NEGATIVE
Protein, ur: NEGATIVE mg/dL
Urobilinogen, UA: 0.2 mg/dL (ref 0.0–1.0)

## 2011-07-31 LAB — CARBAMAZEPINE LEVEL, TOTAL: Carbamazepine Lvl: <0.5 ug/mL — ABNORMAL LOW (ref 4.0–12.0)

## 2011-08-01 LAB — GC/CHLAMYDIA PROBE AMP, GENITAL
Chlamydia, DNA Probe: NEGATIVE
GC Probe Amp, Genital: NEGATIVE

## 2011-08-10 ENCOUNTER — Emergency Department (HOSPITAL_COMMUNITY)
Admission: EM | Admit: 2011-08-10 | Discharge: 2011-08-10 | Disposition: A | Discharge status: Home / Self Care | Payer: Self-pay | Attending: Emergency Medicine | Admitting: Emergency Medicine

## 2011-08-10 ENCOUNTER — Emergency Department (HOSPITAL_COMMUNITY): Payer: Self-pay

## 2011-08-10 DIAGNOSIS — G40909 Epilepsy, unspecified, not intractable, without status epilepticus: Secondary | ICD-10-CM | POA: Insufficient documentation

## 2011-08-10 DIAGNOSIS — M549 Dorsalgia, unspecified: Secondary | ICD-10-CM | POA: Insufficient documentation

## 2011-08-10 DIAGNOSIS — R109 Unspecified abdominal pain: Secondary | ICD-10-CM | POA: Insufficient documentation

## 2011-08-10 DIAGNOSIS — R10816 Epigastric abdominal tenderness: Secondary | ICD-10-CM | POA: Insufficient documentation

## 2011-08-10 DIAGNOSIS — Z79899 Other long term (current) drug therapy: Secondary | ICD-10-CM | POA: Insufficient documentation

## 2011-08-10 DIAGNOSIS — R630 Anorexia: Secondary | ICD-10-CM | POA: Insufficient documentation

## 2011-08-10 LAB — URINALYSIS, ROUTINE W REFLEX MICROSCOPIC
Bilirubin Urine: NEGATIVE
Hgb urine dipstick: NEGATIVE
Ketones, ur: NEGATIVE mg/dL
Nitrite: NEGATIVE
Protein, ur: NEGATIVE mg/dL
Specific Gravity, Urine: 1.005 (ref 1.005–1.030)
Urobilinogen, UA: 0.2 mg/dL (ref 0.0–1.0)

## 2011-08-10 LAB — DIFFERENTIAL
Basophils Absolute: 0.1 10*3/uL (ref 0.0–0.1)
Basophils Relative: 2 % — ABNORMAL HIGH (ref 0–1)
Eosinophils Absolute: 0.5 10*3/uL (ref 0.0–0.7)
Eosinophils Relative: 9 % — ABNORMAL HIGH (ref 0–5)
Monocytes Absolute: 0.6 10*3/uL (ref 0.1–1.0)
Monocytes Relative: 11 % (ref 3–12)
Neutro Abs: 1.3 10*3/uL — ABNORMAL LOW (ref 1.7–7.7)

## 2011-08-10 LAB — CBC
MCH: 24.3 pg — ABNORMAL LOW (ref 26.0–34.0)
MCHC: 31.5 g/dL (ref 30.0–36.0)
Platelets: 324 10*3/uL (ref 150–400)
RDW: 16.3 % — ABNORMAL HIGH (ref 11.5–15.5)

## 2011-08-10 LAB — COMPREHENSIVE METABOLIC PANEL
ALT: 12 U/L (ref 0–35)
Albumin: 3.7 g/dL (ref 3.5–5.2)
Alkaline Phosphatase: 56 U/L (ref 39–117)
Calcium: 8.8 mg/dL (ref 8.4–10.5)
GFR calc Af Amer: >60 mL/min (ref >60)
Glucose, Bld: 86 mg/dL (ref 70–99)
Potassium: 3.9 mEq/L (ref 3.5–5.1)
Sodium: 134 mEq/L — ABNORMAL LOW (ref 135–145)
Total Protein: 7.1 g/dL (ref 6.0–8.3)

## 2011-08-10 LAB — PREGNANCY, URINE: Preg Test, Ur: NEGATIVE

## 2011-08-31 LAB — INFLUENZA A+B VIRUS AG-DIRECT(RAPID)
Inflenza A Ag: NEGATIVE
Influenza B Ag: NEGATIVE

## 2011-09-04 LAB — POCT I-STAT, CHEM 8
Calcium, Ion: 1.17
Chloride: 105
Glucose, Bld: 83
HCT: 36
Hemoglobin: 12.2
Potassium: 4

## 2011-09-04 LAB — D-DIMER, QUANTITATIVE: D-Dimer, Quant: <0.22

## 2011-09-04 LAB — POCT CARDIAC MARKERS: Operator id: 294501

## 2011-09-05 LAB — DIFFERENTIAL
Basophils Absolute: 0.1
Eosinophils Absolute: 0.2
Eosinophils Relative: 5
Lymphocytes Relative: 42
Lymphs Abs: 2.2
Neutro Abs: 1.8
Neutro Abs: 3.1
Neutrophils Relative %: 39 — ABNORMAL LOW
Neutrophils Relative %: 45

## 2011-09-05 LAB — CBC
HCT: 30.4 — ABNORMAL LOW
MCHC: 33.8
MCV: 82
MCV: 82.2
Platelets: 253
RBC: 3.7 — ABNORMAL LOW
WBC: 4.7
WBC: 6.9

## 2011-09-05 LAB — POCT I-STAT, CHEM 8
BUN: 8
Creatinine, Ser: 0.7
Potassium: 4.2
Sodium: 139

## 2011-09-05 LAB — PHENYTOIN LEVEL, TOTAL: Phenytoin Lvl: 8.8 — ABNORMAL LOW

## 2011-09-05 LAB — COMPREHENSIVE METABOLIC PANEL
BUN: 8
CO2: 22
Calcium: 8.8
Chloride: 104
Creatinine, Ser: 0.72
GFR calc non Af Amer: >60
Glucose, Bld: 95
Total Bilirubin: <0.1 — ABNORMAL LOW

## 2011-09-05 LAB — MAGNESIUM: Magnesium: 2

## 2011-09-05 LAB — CARBAMAZEPINE LEVEL, TOTAL: Carbamazepine Lvl: <2 — ABNORMAL LOW

## 2011-09-06 LAB — URINALYSIS, ROUTINE W REFLEX MICROSCOPIC
Ketones, ur: NEGATIVE
Nitrite: NEGATIVE
Protein, ur: NEGATIVE
pH: 6.5

## 2011-09-06 LAB — POCT I-STAT, CHEM 8
BUN: 10
Chloride: 103
Creatinine, Ser: 1
Glucose, Bld: 112 — ABNORMAL HIGH
Hemoglobin: 10.5 — ABNORMAL LOW
Hemoglobin: 11.2 — ABNORMAL LOW
Sodium: 141
TCO2: 26
TCO2: 27

## 2011-09-06 LAB — CARBAMAZEPINE LEVEL, TOTAL: Carbamazepine Lvl: <2 — ABNORMAL LOW

## 2011-09-06 LAB — RAPID URINE DRUG SCREEN, HOSP PERFORMED
Benzodiazepines: NOT DETECTED
Cocaine: NOT DETECTED
Tetrahydrocannabinol: NOT DETECTED

## 2011-09-06 LAB — PHENYTOIN LEVEL, TOTAL: Phenytoin Lvl: <2.5 — ABNORMAL LOW

## 2011-09-09 IMAGING — CR DG CHEST 2V
2 series · 2 of 2 positions shown · non-contrast
Comparison: Two-view chest x-ray 08/31/2010 and 11/15/2009.  CT
angio chest 12/18/2009.

CLINICAL DATA: Inspiratory right sided chest pain for the past 2
days.  Shortness of breath.  Smoker.

CHEST - 2 VIEW 01/11/2011:

[w chest pa]
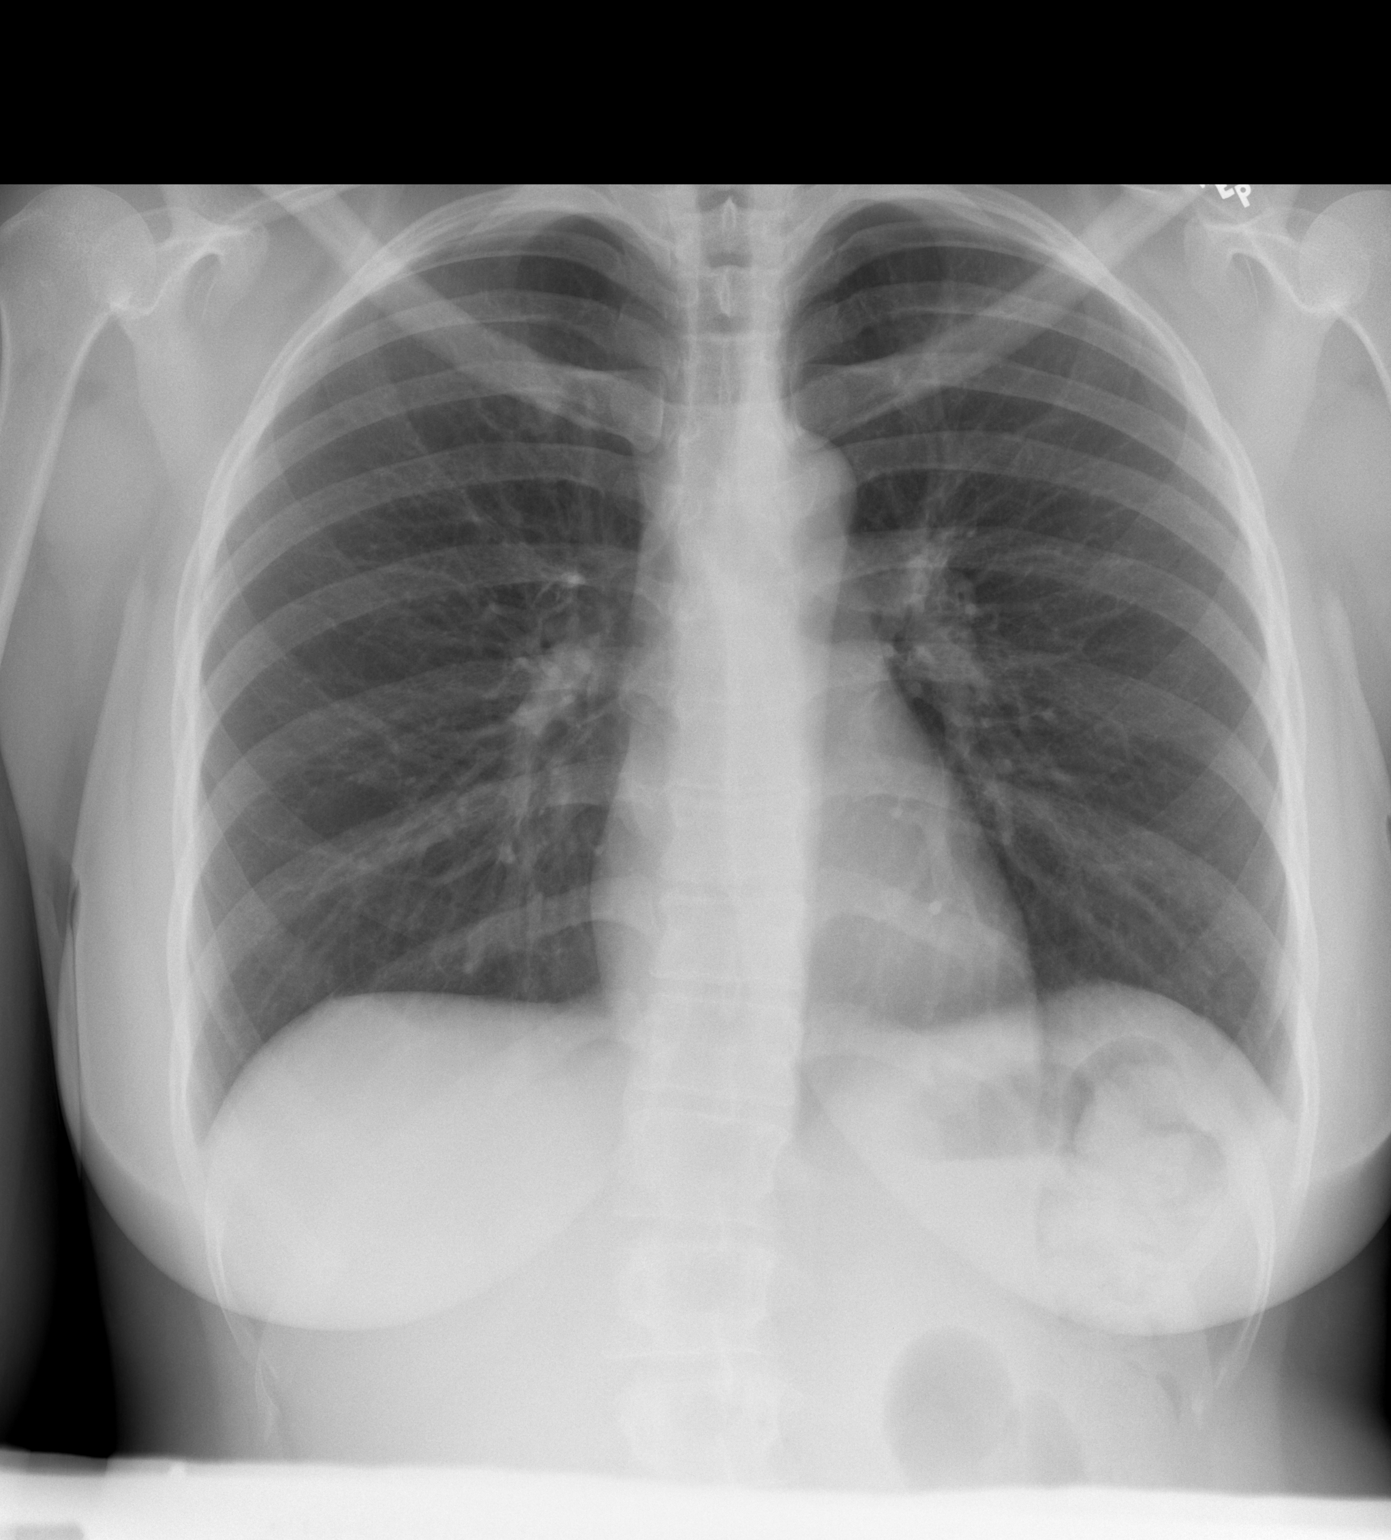

[w chest lat]
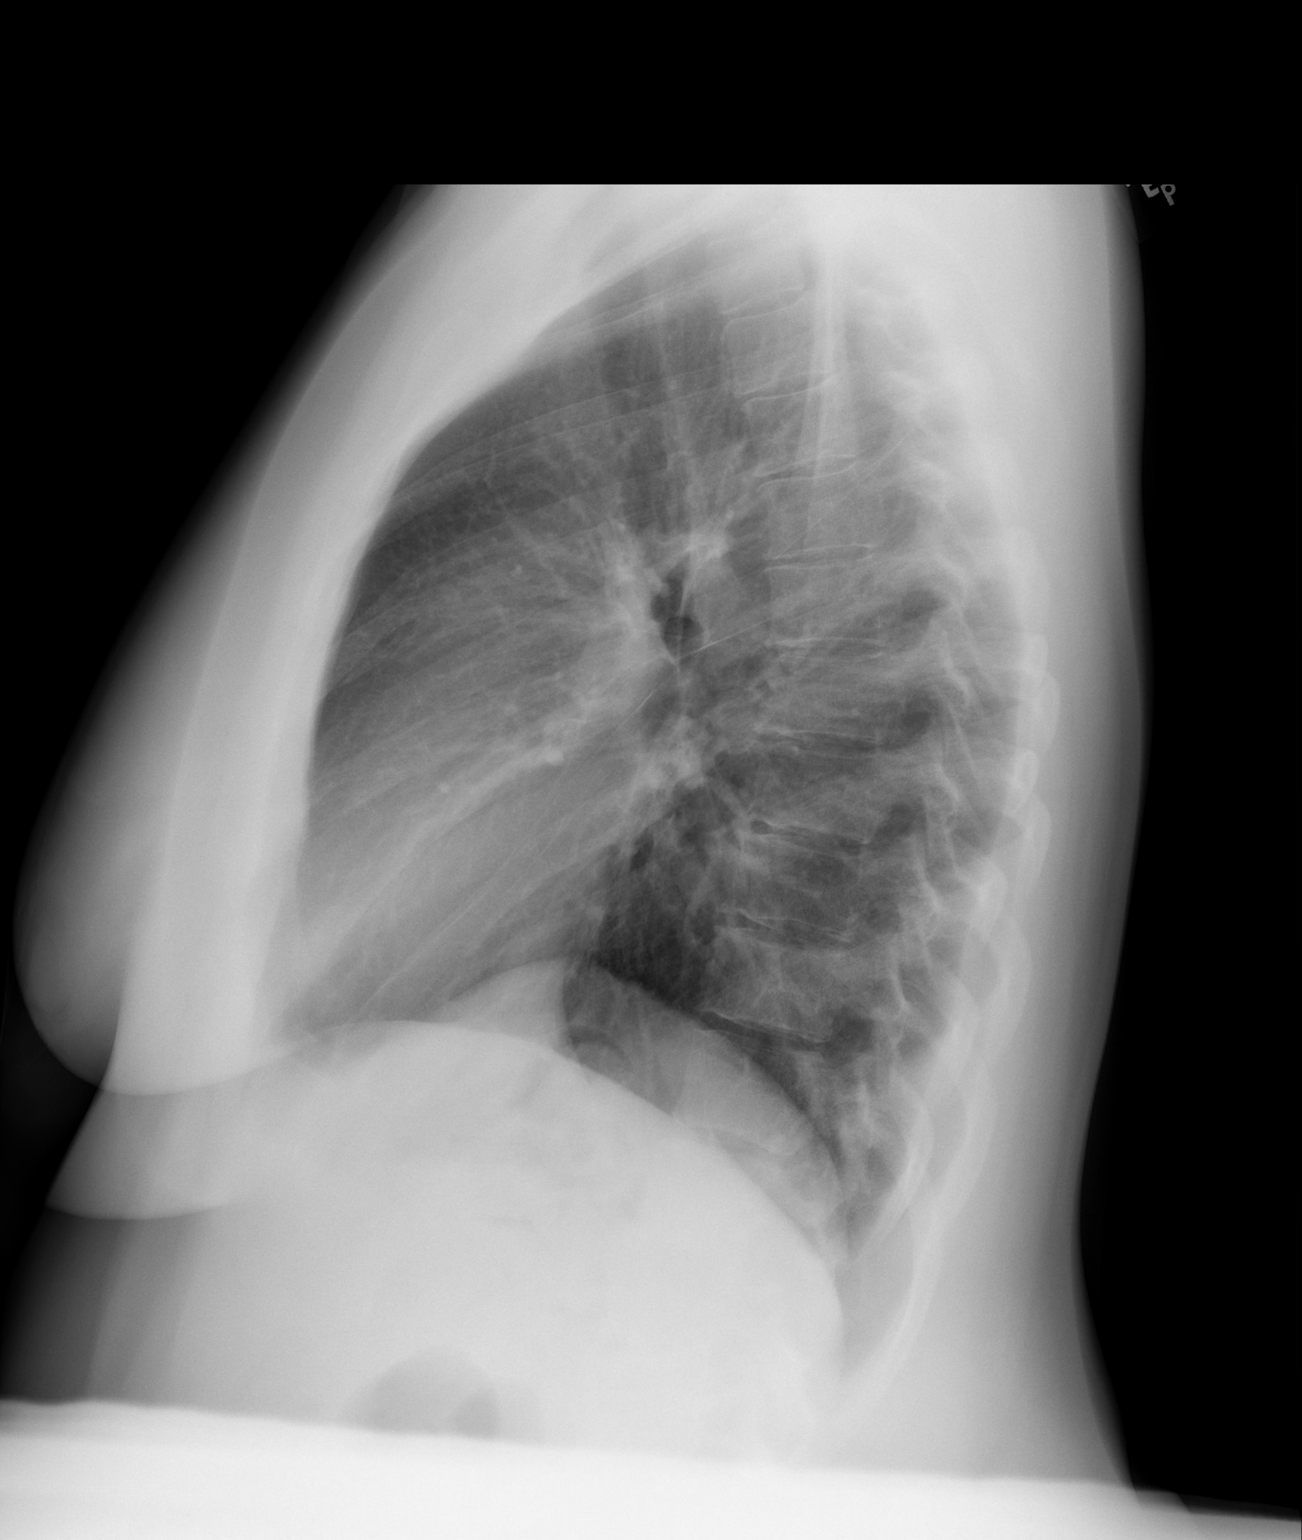

[2 of 2 positions shown; findings below may reference images not displayed]

FINDINGS: Cardiomediastinal silhouette unremarkable.  Lungs clear.
Bronchovascular markings normal.  Pulmonary vascularity normal.  No
pleural effusions.  No pneumothorax.  Visualized bony thorax
intact.  No significant interval change.
IMPRESSION: Normal and stable chest x-ray.

## 2011-09-11 ENCOUNTER — Encounter (HOSPITAL_COMMUNITY): Payer: Self-pay

## 2011-09-11 ENCOUNTER — Inpatient Hospital Stay (HOSPITAL_COMMUNITY)
Admission: AD | Admit: 2011-09-11 | Discharge: 2011-09-11 | Discharge status: Against Medical Advice | Payer: Self-pay | Source: Ambulatory Visit | Attending: Obstetrics & Gynecology | Admitting: Obstetrics & Gynecology

## 2011-09-11 DIAGNOSIS — R109 Unspecified abdominal pain: Secondary | ICD-10-CM | POA: Insufficient documentation

## 2011-09-11 HISTORY — DX: Encounter for other specified aftercare: Z51.89

## 2011-09-11 HISTORY — DX: Unspecified convulsions: R56.9

## 2011-09-11 HISTORY — DX: Anemia, unspecified: D64.9

## 2011-09-11 LAB — URINALYSIS, ROUTINE W REFLEX MICROSCOPIC
Bilirubin Urine: NEGATIVE
Ketones, ur: NEGATIVE mg/dL
Protein, ur: NEGATIVE mg/dL
Urobilinogen, UA: 0.2 mg/dL (ref 0.0–1.0)

## 2011-09-11 NOTE — Progress Notes (Signed)
Pt states she has had generalized abdominal pain for about one week. Has had some nausea and vomiting and diarrhea but not at the same time. Has not had a period in 2 months.

## 2011-09-12 LAB — URINALYSIS, ROUTINE W REFLEX MICROSCOPIC
Glucose, UA: NEGATIVE
Protein, ur: NEGATIVE
pH: 6.5

## 2011-09-12 LAB — PREGNANCY, URINE: Preg Test, Ur: NEGATIVE

## 2011-09-13 LAB — DIFFERENTIAL
Basophils Absolute: 0.1
Basophils Relative: 2 — ABNORMAL HIGH
Eosinophils Absolute: 0.3
Eosinophils Relative: 5
Monocytes Absolute: 0.5
Neutro Abs: 2.6

## 2011-09-13 LAB — CBC
Hemoglobin: 10 — ABNORMAL LOW
MCHC: 32.7
MCV: 83.2
RDW: 15.5

## 2011-09-13 LAB — POCT CARDIAC MARKERS: Myoglobin, poc: 27.8

## 2011-09-13 LAB — BASIC METABOLIC PANEL
CO2: 28
Calcium: 9.1
Glucose, Bld: 82
Sodium: 140

## 2011-09-13 LAB — CARBAMAZEPINE LEVEL, TOTAL: Carbamazepine Lvl: 3.1 — ABNORMAL LOW

## 2011-09-15 LAB — D-DIMER, QUANTITATIVE: D-Dimer, Quant: 0.41 ug{FEU}/mL (ref 0.00–0.48)

## 2011-09-15 LAB — CBC
HCT: 29.8 % — ABNORMAL LOW (ref 36.0–46.0)
MCV: 82.9 fL (ref 78.0–100.0)
Platelets: 210 10*3/uL (ref 150–400)
RDW: 15.7 % — ABNORMAL HIGH (ref 11.5–15.5)
WBC: 4.7 10*3/uL (ref 4.0–10.5)

## 2011-09-15 LAB — POCT I-STAT, CHEM 8
BUN: 9 mg/dL (ref 6–23)
Calcium, Ion: 1.01 mmol/L — ABNORMAL LOW (ref 1.12–1.32)
Chloride: 108 meq/L (ref 96–112)
Creatinine, Ser: 0.7 mg/dL (ref 0.4–1.2)
Glucose, Bld: 78 mg/dL (ref 70–99)
HCT: 29 % — ABNORMAL LOW (ref 36.0–46.0)
Hemoglobin: 9.9 g/dL — ABNORMAL LOW (ref 12.0–15.0)
Potassium: 3.6 mEq/L (ref 3.5–5.1)
Sodium: 139 mEq/L (ref 135–145)
TCO2: 22 mmol/L (ref 0–100)

## 2011-09-15 LAB — POCT CARDIAC MARKERS
CKMB, poc: 1.1 ng/mL (ref 1.0–8.0)
Myoglobin, poc: 54.2 ng/mL (ref 12–200)
Troponin i, poc: <0.05 ng/mL (ref 0.00–0.09)

## 2011-09-15 LAB — DIFFERENTIAL
Basophils Absolute: 0.1 10*3/uL (ref 0.0–0.1)
Basophils Relative: 2 % — ABNORMAL HIGH (ref 0–1)
Eosinophils Absolute: 0.3 10*3/uL (ref 0.0–0.7)
Eosinophils Relative: 6 % — ABNORMAL HIGH (ref 0–5)
Neutrophils Relative %: 37 % — ABNORMAL LOW (ref 43–77)

## 2011-09-20 LAB — POCT I-STAT CREATININE
Creatinine, Ser: 0.7
Operator id: 294511

## 2011-09-20 LAB — DIFFERENTIAL
Basophils Absolute: 0
Basophils Relative: 0
Eosinophils Absolute: 0.3
Lymphocytes Relative: 48 — ABNORMAL HIGH
Lymphs Abs: 3.1
Monocytes Relative: 6
Neutro Abs: 2.6
Neutrophils Relative %: 33 — ABNORMAL LOW
Neutrophils Relative %: 39 — ABNORMAL LOW

## 2011-09-20 LAB — RAPID URINE DRUG SCREEN, HOSP PERFORMED
Benzodiazepines: NOT DETECTED
Cocaine: NOT DETECTED
Tetrahydrocannabinol: NOT DETECTED

## 2011-09-20 LAB — CBC
HCT: 33.3 — ABNORMAL LOW
Hemoglobin: 10.1 — ABNORMAL LOW
Hemoglobin: 11.2 — ABNORMAL LOW
MCHC: 34.1
MCV: 84.5
MCV: 85.4
Platelets: 257
Platelets: 259
Platelets: 276
RBC: 3.61 — ABNORMAL LOW
RDW: 15.2 — ABNORMAL HIGH
RDW: 14.6 — ABNORMAL HIGH
RDW: 14.7 — ABNORMAL HIGH
WBC: 6.6

## 2011-09-20 LAB — COMPREHENSIVE METABOLIC PANEL
ALT: 12
AST: 16
AST: 17
Albumin: 3.3 — ABNORMAL LOW
Alkaline Phosphatase: 49
CO2: 28
Chloride: 104
Creatinine, Ser: 0.67
GFR calc Af Amer: >60
GFR calc Af Amer: >60
GFR calc non Af Amer: >60
Glucose, Bld: 96
Potassium: 3.5
Sodium: 139
Total Bilirubin: 0.3
Total Protein: 6.2

## 2011-09-20 LAB — I-STAT 8, (EC8 V) (CONVERTED LAB)
Acid-base deficit: 1
BUN: 7
Bicarbonate: 23.9
HCT: 31 — ABNORMAL LOW
Operator id: 294511
pCO2, Ven: 42 — ABNORMAL LOW

## 2011-09-20 LAB — BASIC METABOLIC PANEL
BUN: 4 — ABNORMAL LOW
BUN: 3 — ABNORMAL LOW
CO2: 24
Chloride: 104
Chloride: 103
Creatinine, Ser: 0.59
GFR calc Af Amer: >60
Glucose, Bld: 82
Glucose, Bld: 104 — ABNORMAL HIGH
Potassium: 3.7

## 2011-09-20 LAB — URINALYSIS, ROUTINE W REFLEX MICROSCOPIC
Bilirubin Urine: NEGATIVE
Glucose, UA: NEGATIVE
Ketones, ur: NEGATIVE
Nitrite: NEGATIVE
Protein, ur: NEGATIVE
pH: 6

## 2011-09-20 LAB — CARBAMAZEPINE LEVEL, TOTAL
Carbamazepine Lvl: <2 — ABNORMAL LOW
Carbamazepine Lvl: <2 — ABNORMAL LOW

## 2011-09-20 LAB — PHENYTOIN LEVEL, TOTAL: Phenytoin Lvl: 18.8

## 2011-09-21 LAB — PHENYTOIN LEVEL, TOTAL: Phenytoin Lvl: 13.3

## 2011-09-21 LAB — I-STAT 8, (EC8 V) (CONVERTED LAB)
BUN: 9
Glucose, Bld: 90
Hemoglobin: 12.2
Potassium: 3.8
Sodium: 138
TCO2: 24
pH, Ven: 7.39 — ABNORMAL HIGH

## 2011-09-21 LAB — URINALYSIS, ROUTINE W REFLEX MICROSCOPIC
Bilirubin Urine: NEGATIVE
Hgb urine dipstick: NEGATIVE
Nitrite: NEGATIVE
Specific Gravity, Urine: 1.024
Urobilinogen, UA: 0.2
pH: 6

## 2011-09-21 LAB — POCT I-STAT CREATININE: Operator id: 277751

## 2011-09-21 LAB — POCT PREGNANCY, URINE
Operator id: 277751
Preg Test, Ur: NEGATIVE

## 2011-09-21 LAB — RAPID STREP SCREEN (MED CTR MEBANE ONLY): Streptococcus, Group A Screen (Direct): NEGATIVE

## 2011-09-22 LAB — CBC
HCT: 33.6 — ABNORMAL LOW
Hemoglobin: 11.4 — ABNORMAL LOW
MCHC: 33.9
MCV: 85.7
Platelets: 270
RDW: 15 — ABNORMAL HIGH

## 2011-09-22 LAB — DIFFERENTIAL
Basophils Absolute: 0
Basophils Relative: 1
Eosinophils Absolute: 0.4
Eosinophils Relative: 6 — ABNORMAL HIGH
Lymphocytes Relative: 47 — ABNORMAL HIGH
Monocytes Absolute: 0.5

## 2011-09-22 LAB — D-DIMER, QUANTITATIVE: D-Dimer, Quant: 0.22

## 2011-09-22 LAB — I-STAT 8, (EC8 V) (CONVERTED LAB)
Acid-base deficit: 2
Bicarbonate: 23.4
HCT: 35 — ABNORMAL LOW
Hemoglobin: 11.9 — ABNORMAL LOW
Operator id: 282201
Sodium: 138
TCO2: 25
pCO2, Ven: 41.2 — ABNORMAL LOW

## 2011-09-22 LAB — CARBAMAZEPINE LEVEL, TOTAL: Carbamazepine Lvl: 2.6 — ABNORMAL LOW

## 2011-09-26 LAB — WET PREP, GENITAL: Clue Cells Wet Prep HPF POC: NONE SEEN

## 2011-09-26 LAB — CBC
MCHC: 33.6
MCV: 84.1
RBC: 3.63 — ABNORMAL LOW
RDW: 15.2 — ABNORMAL HIGH

## 2011-09-26 LAB — GC/CHLAMYDIA PROBE AMP, GENITAL: Chlamydia, DNA Probe: NEGATIVE

## 2011-09-26 LAB — SAMPLE TO BLOOD BANK

## 2011-09-27 LAB — D-DIMER, QUANTITATIVE: D-Dimer, Quant: <0.22

## 2011-10-13 IMAGING — CR DG CHEST 2V
2 series · 2 of 2 positions shown · non-contrast
Comparison: 01/11/2011

CLINICAL DATA: Chest pain, weakness.

CHEST - 2 VIEW

[w chest lat]
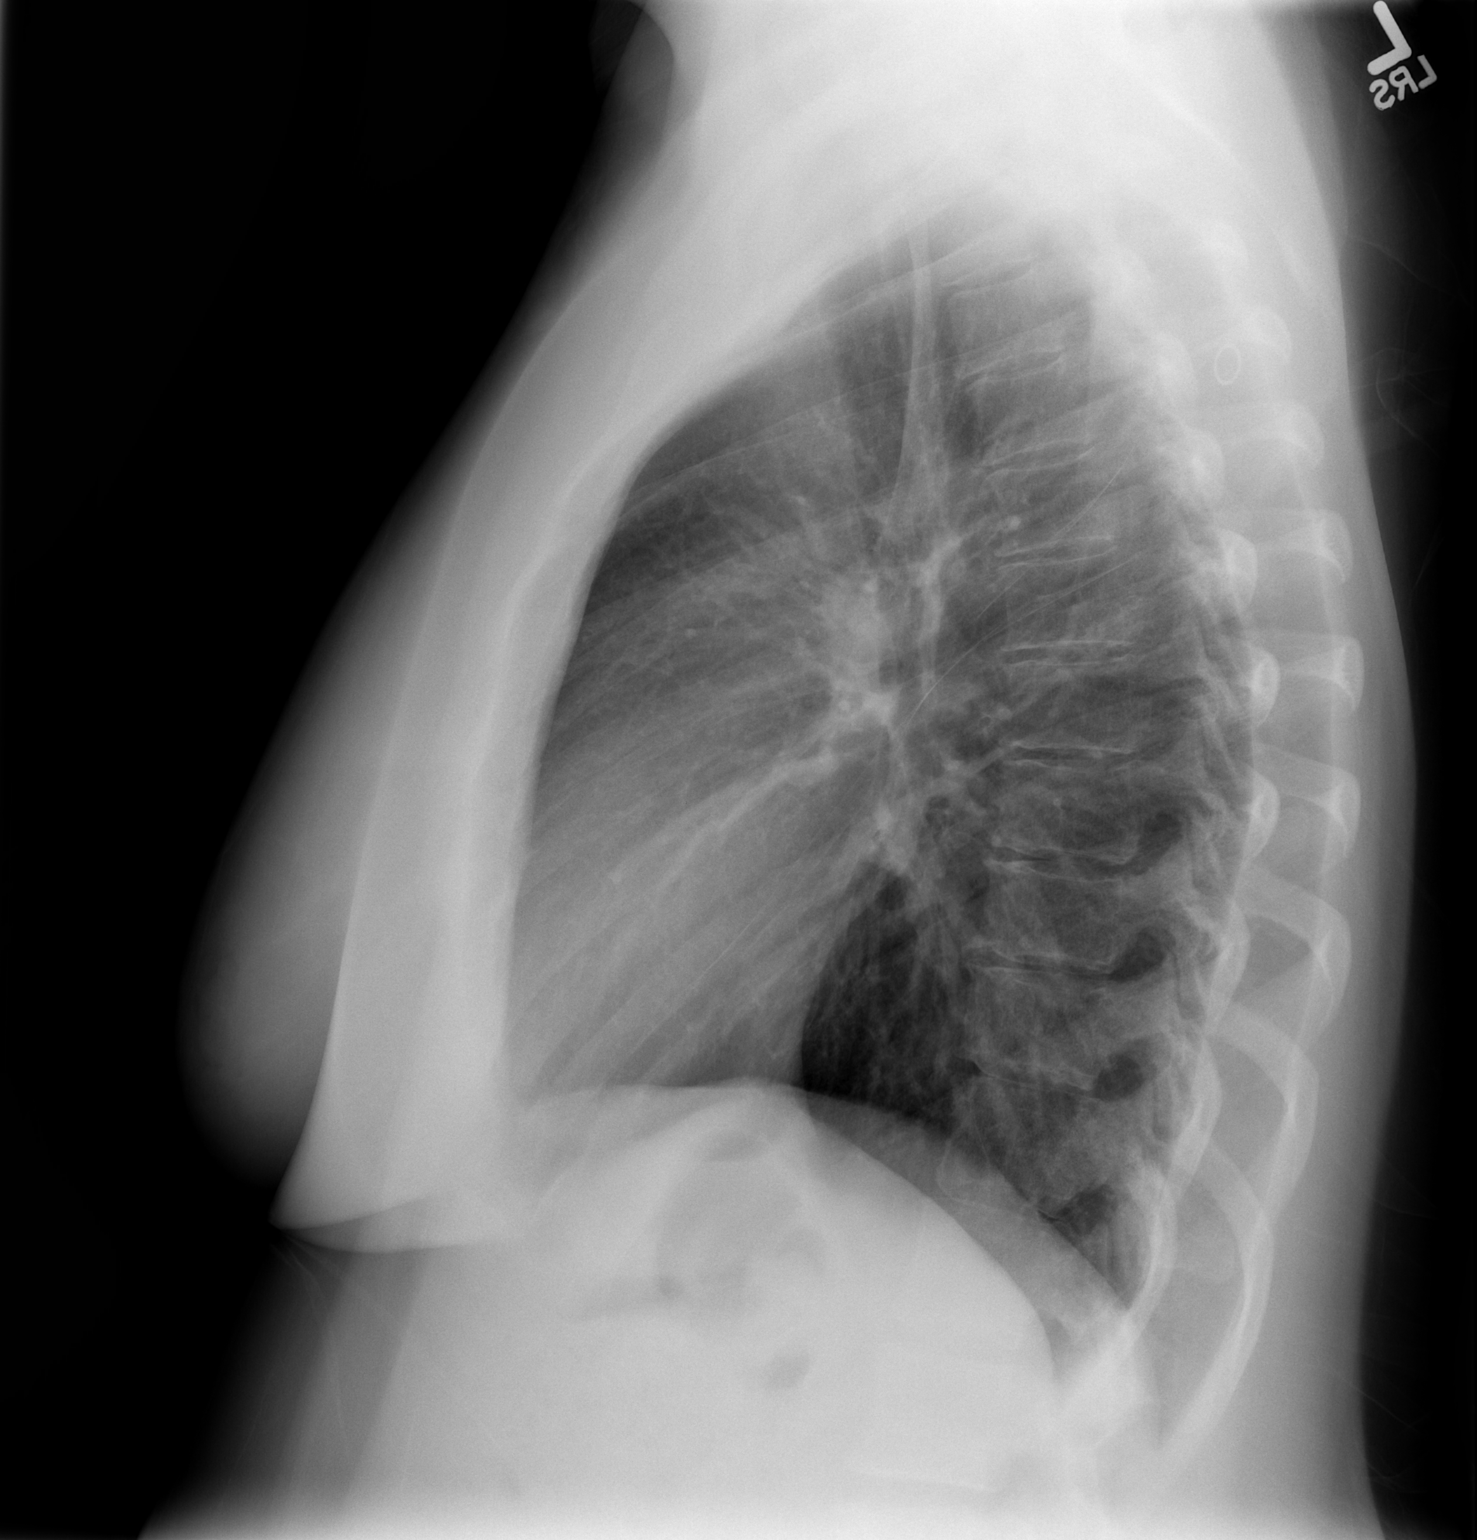

[w chest pa]
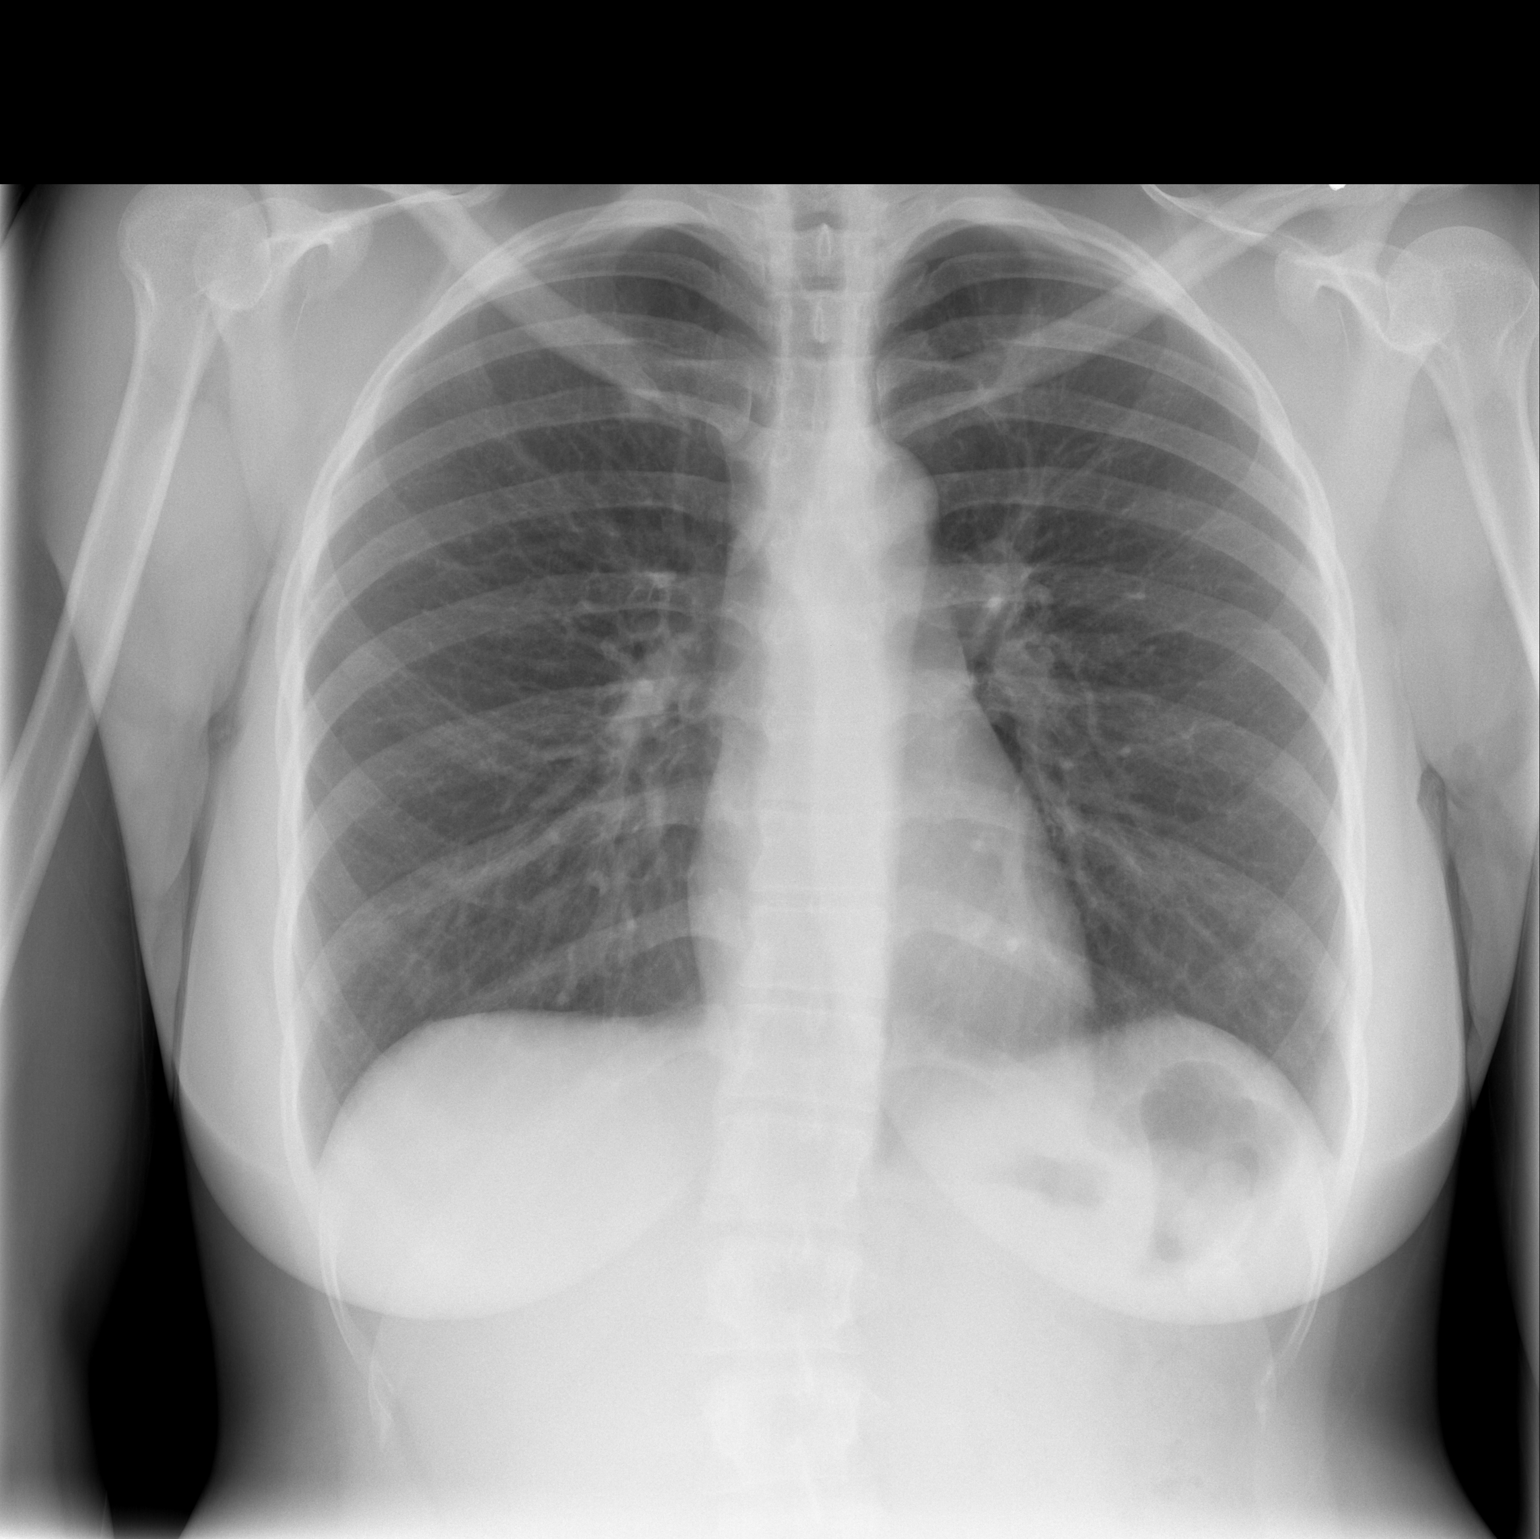

[2 of 2 positions shown; findings below may reference images not displayed]

FINDINGS: Heart and mediastinal contours are within normal limits.
No focal opacities or effusions.  No acute bony abnormality.
IMPRESSION: No active cardiopulmonary disease.

## 2011-11-09 ENCOUNTER — Encounter (HOSPITAL_COMMUNITY): Payer: Self-pay | Admitting: Emergency Medicine

## 2011-11-09 ENCOUNTER — Emergency Department (HOSPITAL_COMMUNITY)
Admission: EM | Admit: 2011-11-09 | Discharge: 2011-11-09 | Discharge status: Against Medical Advice | Payer: Self-pay | Attending: Emergency Medicine | Admitting: Emergency Medicine

## 2011-11-09 DIAGNOSIS — R51 Headache: Secondary | ICD-10-CM | POA: Insufficient documentation

## 2011-11-09 NOTE — ED Notes (Signed)
Pt brought back from triage to [facility] around 0300. Pt is here with c/o headache. Pt extremely upset and angry that she was placed in hallway bed underneath direct light. Stated that she "told them that light hurts her eyes" and aggravates her headache. Patient and friend continues to curse and state that "yall are inconsiderate" and that "it doesn't make since to have to wait." acknowledged patients feelings and both RN & EMT made various efforts to improve situation for patient but patient inconsolable and demanding to leave.

## 2011-11-09 NOTE — ED Notes (Signed)
C/o headache and generalized weakness x 2 weeks.  Reports nausea and vomiting.

## 2011-11-13 ENCOUNTER — Emergency Department (HOSPITAL_COMMUNITY): Payer: Self-pay

## 2011-11-13 ENCOUNTER — Emergency Department (HOSPITAL_COMMUNITY)
Admission: EM | Admit: 2011-11-13 | Discharge: 2011-11-13 | Disposition: A | Discharge status: Home / Self Care | Payer: Self-pay | Attending: Emergency Medicine | Admitting: Emergency Medicine

## 2011-11-13 ENCOUNTER — Encounter (HOSPITAL_COMMUNITY): Payer: Self-pay

## 2011-11-13 DIAGNOSIS — J45909 Unspecified asthma, uncomplicated: Secondary | ICD-10-CM | POA: Insufficient documentation

## 2011-11-13 DIAGNOSIS — R569 Unspecified convulsions: Secondary | ICD-10-CM | POA: Insufficient documentation

## 2011-11-13 DIAGNOSIS — M79609 Pain in unspecified limb: Secondary | ICD-10-CM | POA: Insufficient documentation

## 2011-11-13 DIAGNOSIS — IMO0002 Reserved for concepts with insufficient information to code with codable children: Secondary | ICD-10-CM | POA: Insufficient documentation

## 2011-11-13 DIAGNOSIS — X500XXA Overexertion from strenuous movement or load, initial encounter: Secondary | ICD-10-CM | POA: Insufficient documentation

## 2011-11-13 DIAGNOSIS — S62606A Fracture of unspecified phalanx of right little finger, initial encounter for closed fracture: Secondary | ICD-10-CM

## 2011-11-13 MED ORDER — HYDROCODONE-ACETAMINOPHEN 5-325 MG PO TABS: 1.0000 | ORAL_TABLET | Freq: Four times a day (QID) | ORAL | Status: AC | PRN

## 2011-11-13 NOTE — ED Provider Notes (Signed)
Medical screening examination/treatment/procedure(s) were performed by non-physician practitioner and as supervising physician I was immediately available for consultation/collaboration.   Leigh-Ann Payne Garske, MD 11/13/11 1813 

## 2011-11-13 NOTE — ED Provider Notes (Signed)
History     CSN: [id number] Arrival date & time: 11/13/2011  1:35 PM   First MD Initiated Contact with Patient 11/13/11 1718      Chief Complaint  Patient presents with  . Hand Pain    last night rt. 5th finger bent the wrong way, very swollen and brusied , warm to touch    (Consider location/radiation/quality/duration/timing/severity/associated sxs/prior treatment) Patient is a 35 y.o. female presenting with hand pain. The history is provided by the patient.  Hand Pain This is a new problem. The current episode started yesterday. The problem occurs constantly. The problem has been gradually worsening. Associated symptoms include joint swelling and weakness. Pertinent negatives include no chills, fever or numbness. The symptoms are aggravated by bending. She has tried nothing for the symptoms.  Hyperextension injury of the right 5th digit while playing with daughter yesterday. Pt is RHD. Able to move finger last night but awoke with morning unable to flex and with significant swelling.  Past Medical History  Diagnosis Date  . Seizures   . Anemia   . Asthma   . Blood transfusion 2011    r/t anemia    Past Surgical History  Procedure Date  . Tubal ligation 2005    No family history on file.  History  Substance Use Topics  . Smoking status: Current Some Day Smoker  . Smokeless tobacco: Not on file  . Alcohol Use: No    OB History    Grav Para Term Preterm Abortions TAB SAB Ect Mult Living   3 2 1 1 1  0 0 1 0 2      Review of Systems  Constitutional: Negative for fever and chills.  Musculoskeletal: Positive for joint swelling. Negative for back pain and gait problem.  Skin: Positive for color change. Negative for wound.  Neurological: Positive for weakness. Negative for numbness.  Hematological: Does not bruise/bleed easily.    Allergies  Penicillins and Sulfa antibiotics  Home Medications   Current Outpatient Rx  Name Route Sig Dispense Refill  .  CARBAMAZEPINE 200 MG PO TABS Oral Take 100 mg by mouth 2 (two) times daily.      Marland Kitchen FERROUS SULFATE 325 (65 FE) MG PO TABS Oral Take 325 mg by mouth daily with breakfast.      . HYDROCODONE-ACETAMINOPHEN 5-325 MG PO TABS Oral Take 1 tablet by mouth every 6 (six) hours as needed. For pain     . PHENYTOIN SODIUM EXTENDED 100 MG PO CAPS Oral Take 100 mg by mouth 4 (four) times a week.      . TOPIRAMATE 50 MG PO TABS Oral Take 50 mg by mouth 2 (two) times daily.        BP 100/66  Pulse 87  Temp(Src) 98.1 F (36.7 C) (Oral)  Ht 5\' 5"  (1.651 m)  Wt 165 lb (74.844 kg)  BMI 27.46 kg/m2  SpO2 98%  LMP 11/11/2011  Physical Exam  Constitutional: She is oriented to person, place, and time. She appears well-developed and well-nourished. No distress.  HENT:  Head: Normocephalic and atraumatic.  Right Ear: External ear normal.  Left Ear: External ear normal.  Eyes: EOM are normal. Pupils are equal, round, and reactive to light.  Neck: Normal range of motion. Neck supple.  Cardiovascular: Normal rate and regular rhythm.   Pulmonary/Chest: Effort normal and breath sounds normal. No respiratory distress.  Abdominal: Soft. She exhibits no distension. There is no tenderness.  Musculoskeletal:       Right hand:  She exhibits decreased range of motion, tenderness, bony tenderness and swelling. She exhibits normal two-point discrimination, normal capillary refill, no deformity and no laceration. normal sensation noted.       Hands:      Pt with limited flex/ext of right 5th digit at all joints. Significant TTP of digit from DIP to MCP joint. Swelling noted to PIP joint with ecchymosis to volar aspect.  No other joints with tenderness, edema, or decreased ROM.  Neurological: She is alert and oriented to person, place, and time. No cranial nerve deficit. Coordination normal.       Sensation intact to light touch  Skin: Skin is warm and dry. No rash noted.    ED Course  Procedures (including critical  care time)  Labs Reviewed - No data to display Dg Finger Little Right  11/13/2011  *RADIOLOGY REPORT*  Clinical Data: Bruising and swelling  RIGHT LITTLE FINGER 2+V  Comparison: None.  Findings: Three views of the right fifth finger submitted.  There is a volar plate avulsion fracture at the base of middle phalanx.  IMPRESSION: Volar plate avulsion fracture at the base of the middle phalanx.  Original Report Authenticated By: Natasha Mead, M.D.     1. Fracture of phalanx of right little finger       MDM  X-RAY REVIEWED BY me, pt with volar plate fx to right 5th middle phalanx. Will place splint and have advised close ortho f/u. Advised pt that arthritis can develop in this joint and f/u is important. Will also give pain rx.        Elwyn Reach [place], [place] 11/13/11 1729

## 2011-11-13 NOTE — ED Notes (Signed)
Patient presents with right pinky finger swelling and pain since last night after playing with her kids.  Swelling present, patient denies loss of sensation and has difficulty moving right finger. Skin is warm, dry and intact. Pulses strong and present to right extremity.

## 2011-11-28 ENCOUNTER — Ambulatory Visit: Payer: BC Managed Care – PPO | Attending: Orthopedic Surgery | Admitting: Occupational Therapy

## 2011-12-10 ENCOUNTER — Emergency Department (HOSPITAL_COMMUNITY)
Admission: EM | Admit: 2011-12-10 | Discharge: 2011-12-10 | Disposition: A | Discharge status: Home / Self Care | Payer: Self-pay | Attending: Emergency Medicine | Admitting: Emergency Medicine

## 2011-12-10 ENCOUNTER — Encounter (HOSPITAL_COMMUNITY): Payer: Self-pay | Admitting: *Deleted

## 2011-12-10 ENCOUNTER — Emergency Department (HOSPITAL_COMMUNITY): Payer: Self-pay

## 2011-12-10 DIAGNOSIS — Z79899 Other long term (current) drug therapy: Secondary | ICD-10-CM | POA: Insufficient documentation

## 2011-12-10 DIAGNOSIS — D573 Sickle-cell trait: Secondary | ICD-10-CM | POA: Insufficient documentation

## 2011-12-10 DIAGNOSIS — R0602 Shortness of breath: Secondary | ICD-10-CM | POA: Insufficient documentation

## 2011-12-10 DIAGNOSIS — R509 Fever, unspecified: Secondary | ICD-10-CM | POA: Insufficient documentation

## 2011-12-10 DIAGNOSIS — F17200 Nicotine dependence, unspecified, uncomplicated: Secondary | ICD-10-CM | POA: Insufficient documentation

## 2011-12-10 DIAGNOSIS — IMO0001 Reserved for inherently not codable concepts without codable children: Secondary | ICD-10-CM | POA: Insufficient documentation

## 2011-12-10 DIAGNOSIS — J45909 Unspecified asthma, uncomplicated: Secondary | ICD-10-CM | POA: Insufficient documentation

## 2011-12-10 DIAGNOSIS — J069 Acute upper respiratory infection, unspecified: Secondary | ICD-10-CM | POA: Insufficient documentation

## 2011-12-10 DIAGNOSIS — R079 Chest pain, unspecified: Secondary | ICD-10-CM | POA: Insufficient documentation

## 2011-12-10 DIAGNOSIS — R070 Pain in throat: Secondary | ICD-10-CM | POA: Insufficient documentation

## 2011-12-10 DIAGNOSIS — J3489 Other specified disorders of nose and nasal sinuses: Secondary | ICD-10-CM | POA: Insufficient documentation

## 2011-12-10 DIAGNOSIS — R059 Cough, unspecified: Secondary | ICD-10-CM | POA: Insufficient documentation

## 2011-12-10 DIAGNOSIS — R51 Headache: Secondary | ICD-10-CM | POA: Insufficient documentation

## 2011-12-10 DIAGNOSIS — R05 Cough: Secondary | ICD-10-CM | POA: Insufficient documentation

## 2011-12-10 HISTORY — DX: Sickle-cell trait: D57.3

## 2011-12-10 MED ORDER — KETOROLAC TROMETHAMINE 60 MG/2ML IM SOLN
60.0000 mg | Freq: Once | INTRAMUSCULAR | Status: AC
  Administered 2011-12-10: 60 mg via INTRAMUSCULAR
  Filled 2011-12-10: qty 2

## 2011-12-10 MED ORDER — DICLOFENAC SODIUM 75 MG PO TBEC: 75.0000 mg | DELAYED_RELEASE_TABLET | Freq: Two times a day (BID) | ORAL | Status: DC

## 2011-12-10 MED ORDER — DEXAMETHASONE SODIUM PHOSPHATE 10 MG/ML IJ SOLN
10.0000 mg | Freq: Once | INTRAMUSCULAR | Status: AC
  Administered 2011-12-10: 10 mg via INTRAMUSCULAR
  Filled 2011-12-10: qty 1

## 2011-12-10 MED ORDER — ALBUTEROL SULFATE HFA 108 (90 BASE) MCG/ACT IN AERS
2.0000 | INHALATION_SPRAY | Freq: Once | RESPIRATORY_TRACT | Status: AC
  Administered 2011-12-10: 2 via RESPIRATORY_TRACT
  Filled 2011-12-10: qty 6.7

## 2011-12-10 MED ORDER — PREDNISONE (PAK) 10 MG PO TABS: ORAL_TABLET | ORAL | Status: AC

## 2011-12-10 MED ORDER — HYDROCOD POLST-CHLORPHEN POLST 10-8 MG/5ML PO LQCR: 5.0000 mL | Freq: Two times a day (BID) | ORAL | Status: DC | PRN

## 2011-12-10 NOTE — ED Notes (Signed)
Pt reports flu like symptoms, cough, generalized body aches, sore throat since last week.

## 2011-12-10 NOTE — ED Provider Notes (Signed)
History     CSN: [id number]  Arrival date & time 12/10/11  1230   First MD Initiated Contact with Patient 12/10/11 1411     2:39 PM HPI Patient is a 35 y.o. female presenting with cough. The history is provided by the patient.  Cough This is a new problem. The current episode started more than 2 days ago. The problem occurs hourly. The problem has been gradually worsening. The cough is non-productive. The maximum temperature recorded prior to her arrival was 100 to 100.9 F. The fever has been present for 1 to 2 days. Associated symptoms include chest pain, headaches, rhinorrhea and myalgias. Pertinent negatives include no chills, no sweats, no ear congestion, no ear pain, no sore throat, no shortness of breath and no wheezing. She has tried decongestants for the symptoms. The treatment provided no relief. She is a smoker. Her past medical history is significant for asthma.    Past Medical History  Diagnosis Date  . Seizures   . Anemia   . Asthma   . Blood transfusion 2011    r/t anemia  . Migraine   . Sickle cell trait     Past Surgical History  Procedure Date  . Tubal ligation 2005    No family history on file.  History  Substance Use Topics  . Smoking status: Current Some Day Smoker  . Smokeless tobacco: Not on file  . Alcohol Use: No    OB History    Grav Para Term Preterm Abortions TAB SAB Ect Mult Living   3 2 1 1 1  0 0 1 0 2      Review of Systems  Constitutional: Positive for fever. Negative for chills.  HENT: Positive for rhinorrhea. Negative for ear pain, sore throat, trouble swallowing, neck pain and neck stiffness.   Respiratory: Positive for cough. Negative for shortness of breath and wheezing.   Cardiovascular: Positive for chest pain.  Gastrointestinal: Negative for nausea, vomiting, abdominal pain and diarrhea.  Genitourinary: Negative for dysuria.  Musculoskeletal: Positive for myalgias.  Neurological: Positive for headaches.    Allergies    Penicillins and Sulfa antibiotics  Home Medications   Current Outpatient Rx  Name Route Sig Dispense Refill  . ALBUTEROL SULFATE HFA 108 (90 BASE) MCG/ACT IN AERS Inhalation Inhale 2 puffs into the lungs every 6 (six) hours as needed. Shortness of breath     . CARBAMAZEPINE 200 MG PO TABS Oral Take 100 mg by mouth 2 (two) times daily.      Marland Kitchen FERROUS SULFATE 325 (65 FE) MG PO TABS Oral Take 325 mg by mouth daily with breakfast.      . PHENYTOIN SODIUM EXTENDED 100 MG PO CAPS Oral Take 100 mg by mouth 4 (four) times a week.      . TOPIRAMATE 50 MG PO TABS Oral Take 50 mg by mouth 2 (two) times daily.        BP 116/76  Pulse 60  Temp(Src) 98.5 F (36.9 C) (Oral)  Resp 20  SpO2 100%  LMP 12/02/2011  Physical Exam  Vitals reviewed. Constitutional: She is oriented to person, place, and time. Vital signs are normal. She appears well-developed and well-nourished.  HENT:  Head: Normocephalic and atraumatic.  Eyes: Conjunctivae are normal. Pupils are equal, round, and reactive to light.  Neck: Normal range of motion. Neck supple.  Cardiovascular: Normal rate, regular rhythm and normal heart sounds.  Exam reveals no friction rub.   No murmur heard. Pulmonary/Chest: Effort normal and  breath sounds normal. She has no wheezes. She has no rhonchi. She has no rales. She exhibits no tenderness.  Musculoskeletal: Normal range of motion.  Neurological: She is alert and oriented to person, place, and time. Coordination normal.  Skin: Skin is warm and dry. No rash noted. No erythema. No pallor.    ED Course  Procedures Dg Chest 2 View  12/10/2011  *RADIOLOGY REPORT*  Clinical Data:  Cough, fever and shortness of breath.  CHEST - 2 VIEW  Comparison: 07/21/2011  Findings: The heart size and mediastinal contours are within normal limits.  Both lungs are clear.  The visualized skeletal structures are unremarkable.  IMPRESSION: No active disease.  Original Report Authenticated By: Reola Calkins,  M.D.    MDM     3:15 PM We'll treat as an upper respiratory tract infection. Discussed this with patient. She agrees with plan.    Thomasene Lot, [place] 12/10/11 1515

## 2011-12-13 NOTE — ED Provider Notes (Signed)
Medical screening examination/treatment/procedure(s) were performed by non-physician practitioner and as supervising physician I was immediately available for consultation/collaboration.   Suzi Roots, MD 12/13/11 [phone]

## 2012-02-12 ENCOUNTER — Inpatient Hospital Stay (HOSPITAL_COMMUNITY)
Admission: AD | Admit: 2012-02-12 | Discharge: 2012-02-12 | Disposition: A | Discharge status: Home / Self Care | Payer: BC Managed Care – PPO | Source: Ambulatory Visit | Attending: Obstetrics & Gynecology | Admitting: Obstetrics & Gynecology

## 2012-02-12 ENCOUNTER — Inpatient Hospital Stay (HOSPITAL_COMMUNITY): Payer: BC Managed Care – PPO

## 2012-02-12 ENCOUNTER — Encounter (HOSPITAL_COMMUNITY): Payer: Self-pay

## 2012-02-12 DIAGNOSIS — N949 Unspecified condition associated with female genital organs and menstrual cycle: Secondary | ICD-10-CM | POA: Insufficient documentation

## 2012-02-12 DIAGNOSIS — N938 Other specified abnormal uterine and vaginal bleeding: Secondary | ICD-10-CM | POA: Insufficient documentation

## 2012-02-12 DIAGNOSIS — N926 Irregular menstruation, unspecified: Secondary | ICD-10-CM

## 2012-02-12 DIAGNOSIS — A499 Bacterial infection, unspecified: Secondary | ICD-10-CM

## 2012-02-12 DIAGNOSIS — D649 Anemia, unspecified: Secondary | ICD-10-CM

## 2012-02-12 DIAGNOSIS — B9689 Other specified bacterial agents as the cause of diseases classified elsewhere: Secondary | ICD-10-CM

## 2012-02-12 DIAGNOSIS — N760 Acute vaginitis: Secondary | ICD-10-CM

## 2012-02-12 DIAGNOSIS — N939 Abnormal uterine and vaginal bleeding, unspecified: Secondary | ICD-10-CM

## 2012-02-12 DIAGNOSIS — D219 Benign neoplasm of connective and other soft tissue, unspecified: Secondary | ICD-10-CM

## 2012-02-12 DIAGNOSIS — D259 Leiomyoma of uterus, unspecified: Secondary | ICD-10-CM

## 2012-02-12 LAB — URINALYSIS, ROUTINE W REFLEX MICROSCOPIC
Glucose, UA: NEGATIVE mg/dL
Leukocytes, UA: NEGATIVE
Nitrite: NEGATIVE
Protein, ur: NEGATIVE mg/dL
Urobilinogen, UA: 0.2 mg/dL (ref 0.0–1.0)

## 2012-02-12 LAB — CBC
MCHC: 29.8 g/dL — ABNORMAL LOW (ref 30.0–36.0)
Platelets: 315 10*3/uL (ref 150–400)
RDW: 19.1 % — ABNORMAL HIGH (ref 11.5–15.5)
WBC: 5.2 10*3/uL (ref 4.0–10.5)

## 2012-02-12 LAB — WET PREP, GENITAL
Trich, Wet Prep: NONE SEEN
Yeast Wet Prep HPF POC: NONE SEEN

## 2012-02-12 LAB — URINE MICROSCOPIC-ADD ON

## 2012-02-12 LAB — POCT PREGNANCY, URINE: Preg Test, Ur: NEGATIVE

## 2012-02-12 MED ORDER — KETOROLAC TROMETHAMINE 60 MG/2ML IM SOLN
60.0000 mg | Freq: Once | INTRAMUSCULAR | Status: AC
  Administered 2012-02-12: 60 mg via INTRAMUSCULAR
  Filled 2012-02-12: qty 2

## 2012-02-12 MED ORDER — METRONIDAZOLE 500 MG PO TABS: 500.0000 mg | ORAL_TABLET | Freq: Two times a day (BID) | ORAL | Status: AC

## 2012-02-12 MED ORDER — OXYCODONE-ACETAMINOPHEN 5-325 MG PO TABS: 2.0000 | ORAL_TABLET | ORAL | Status: AC | PRN

## 2012-02-12 MED ORDER — MEDROXYPROGESTERONE ACETATE 150 MG/ML IM SUSP
150.0000 mg | Freq: Once | INTRAMUSCULAR | Status: AC
  Administered 2012-02-12: 150 mg via INTRAMUSCULAR
  Filled 2012-02-12: qty 1

## 2012-02-12 NOTE — ED Provider Notes (Signed)
History     No chief complaint on file.  HPI Pt is not pregnant and presents with heavy vaginal bleeding changing tampon and pad every 2 hours for 2 weeks. She also noticed a vaginal discharge with odor.  She has had RCM until this time.  Her last normal period was in January.  She last had IC December 23, 2011.  She denies pain with urination.  She denies diarrhea. She says she has constipation and has not had a bowel movement in 2 weeks.  She has taken Miralax without relief and also has had a Fleets enema without relief.    Past Medical History  Diagnosis Date  . Anemia   . Asthma   . Blood transfusion 2011    r/t anemia  . Migraine   . Sickle cell trait   . Seizures     last sz Jan 2012    Past Surgical History  Procedure Date  . Tubal ligation 2005    Family History  Problem Relation Age of Onset  . Anesthesia problems Neg Hx     History  Substance Use Topics  . Smoking status: Current Some Day Smoker  . Smokeless tobacco: Not on file  . Alcohol Use: No    Allergies:  Allergies  Allergen Reactions  . Penicillins Swelling  . Sulfa Antibiotics Rash    Prescriptions prior to admission  Medication Sig Dispense Refill  . carbamazepine (TEGRETOL) 200 MG tablet Take 100 mg by mouth 2 (two) times daily.        . diclofenac (VOLTAREN) 75 MG EC tablet Take 1 tablet (75 mg total) by mouth 2 (two) times daily.  30 tablet  0  . ferrous sulfate 325 (65 FE) MG tablet Take 325 mg by mouth daily with breakfast.        . HYDROcodone-acetaminophen (NORCO) 5-325 MG per tablet Take 1 tablet by mouth every 6 (six) hours as needed. Patient used this medication for her migraine.      Marland Kitchen ibuprofen (ADVIL,MOTRIN) 200 MG tablet Take 200 mg by mouth every 6 (six) hours as needed. Patient used this medication for pain.      . phenytoin (DILANTIN) 100 MG ER capsule Take 100 mg by mouth 4 (four) times a week.        . topiramate (TOPAMAX) 50 MG tablet Take 50 mg by mouth 2 (two) times daily.         Marland Kitchen albuterol (PROVENTIL HFA;VENTOLIN HFA) 108 (90 BASE) MCG/ACT inhaler Inhale 2 puffs into the lungs every 6 (six) hours as needed. Shortness of breath       . chlorpheniramine-HYDROcodone (TUSSIONEX PENNKINETIC ER) 10-8 MG/5ML LQCR Take 5 mLs by mouth every 12 (twelve) hours as needed.  140 mL  0    Review of Systems  Constitutional: Negative for fever and chills.  Gastrointestinal: Positive for abdominal pain. Negative for nausea and vomiting.  Genitourinary: Negative for dysuria and flank pain.   Physical Exam   Blood pressure 122/86, pulse 87, temperature 98.4 F (36.9 C), temperature source Oral, resp. rate 18, height 5\' 5"  (1.651 m), weight 189 lb 3.2 oz (85.821 kg).  Physical Exam  Vitals reviewed. Constitutional: She appears well-developed and well-nourished.  HENT:  Head: Normocephalic.  Eyes: Pupils are equal, round, and reactive to light.  Neck: Normal range of motion. Neck supple.  Cardiovascular: Normal rate.   Respiratory: Effort normal.  GI: Soft.  Genitourinary:       Mod amount of  red  blood in vault with tissue appearing vs clot in vault; cervix tender to touch, clean, closed; bimanual diffusely tender- uterus and bilateral adnexa; no rebound  Musculoskeletal: Normal range of motion.  Neurological: She is alert.  Skin: Skin is warm and dry.  Psychiatric: She has a normal mood and affect.    MAU Course  Procedures Results for orders placed during the hospital encounter of 02/12/12 (from the past 24 hour(s))  URINALYSIS, ROUTINE W REFLEX MICROSCOPIC     Status: Abnormal   Collection Time   02/12/12  6:49 PM      Component Value Range   Color, Urine YELLOW  YELLOW    APPearance CLEAR  CLEAR    Specific Gravity, Urine 1.015  1.005 - 1.030    pH 7.5  5.0 - 8.0    Glucose, UA NEGATIVE  NEGATIVE (mg/dL)   Hgb urine dipstick MODERATE (*) NEGATIVE    Bilirubin Urine NEGATIVE  NEGATIVE    Ketones, ur NEGATIVE  NEGATIVE (mg/dL)   Protein, ur NEGATIVE   NEGATIVE (mg/dL)   Urobilinogen, UA 0.2  0.0 - 1.0 (mg/dL)   Nitrite NEGATIVE  NEGATIVE    Leukocytes, UA NEGATIVE  NEGATIVE   URINE MICROSCOPIC-ADD ON     Status: Normal   Collection Time   02/12/12  6:49 PM      Component Value Range   Squamous Epithelial / LPF RARE  RARE    WBC, UA 0-2  <3 (WBC/hpf)   RBC / HPF 11-20  <3 (RBC/hpf)   Urine-Other MUCOUS PRESENT    POCT PREGNANCY, URINE     Status: Normal   Collection Time   02/12/12  7:05 PM      Component Value Range   Preg Test, Ur NEGATIVE  NEGATIVE   CBC     Status: Abnormal   Collection Time   02/12/12  7:30 PM      Component Value Range   WBC 5.2  4.0 - 10.5 (K/uL)   RBC 3.69 (*) 3.87 - 5.11 (MIL/uL)   Hemoglobin 8.2 (*) 12.0 - 15.0 (g/dL)   HCT 16.1 (*) 09.6 - 46.0 (%)   MCV 74.5 (*) 78.0 - 100.0 (fL)   MCH 22.2 (*) 26.0 - 34.0 (pg)   MCHC 29.8 (*) 30.0 - 36.0 (g/dL)   RDW 04.5 (*) 40.9 - 15.5 (%)   Platelets 315  150 - 400 (K/uL)  WET PREP, GENITAL     Status: Abnormal   Collection Time   02/12/12  8:08 PM      Component Value Range   Yeast Wet Prep HPF POC NONE SEEN  NONE SEEN    Trich, Wet Prep NONE SEEN  NONE SEEN    Clue Cells Wet Prep HPF POC MANY (*) NONE SEEN    WBC, Wet Prep HPF POC FEW (*) NONE SEEN     Assessment and Plan  TRANSABDOMINAL AND TRANSVAGINAL ULTRASOUND OF PELVIS  Technique: Both transabdominal and transvaginal ultrasound  examinations of the pelvis were performed. Transabdominal technique  was performed for global imaging of the pelvis including uterus,  ovaries, adnexal regions, and pelvic cul-de-sac.  Comparison: None.  It was necessary to proceed with endovaginal exam following the  transabdominal exam to visualize the right ovary and adnexa.  Findings:  Uterus: 10.2 cm length by 6.2 cm AP by 7.8 cm transverse. Small  leiomyomata up to 1.9 cm diameter.  Endometrium: Mildly thickened, 12 mm thick. Small amount of  endometrial fluid.  Right ovary: 3.0  x 2.0 x 1.8 cm. Normal morphology  without mass.  Left ovary: 2.8 x 1.1 x 1.6 cm. Normal morphology without mass.  Other findings: No free fluid  IMPRESSION:  Multiple small uterine leiomyomata.  Minimally prominent endometrial complex 12 mm thick, containing  fluid, nonspecific.  Otherwise negative exam.   Abnormal uterine bleeding- discussed options of pt using BCP and pt said she had previously used DepoProvera- I agreed that we could try Depo Provera 150mg  IM given in MAU Fibroid uterus- to f/u with Dr. Clearance Coots Anemia- continue iron supplement Bacterial vaginosis- Flagyl 500mg  BID for 7 days GC/chlamdyia pending Nicole [name] 02/12/2012, 7:21 PM

## 2012-02-12 NOTE — Progress Notes (Signed)
Pt states moderate amount of red bleeding with several large clots x2 weeks. States has gone through 8 tampons and pads today. Also complains of upper abdominal burning x1 week that is worse when she eats. Denies n/v/d. States has felt weak and is always cold.

## 2012-02-26 ENCOUNTER — Emergency Department (HOSPITAL_COMMUNITY)
Admission: EM | Admit: 2012-02-26 | Discharge: 2012-02-26 | Disposition: A | Discharge status: Home / Self Care | Payer: Self-pay | Attending: Emergency Medicine | Admitting: Emergency Medicine

## 2012-02-26 ENCOUNTER — Encounter (HOSPITAL_COMMUNITY): Payer: Self-pay

## 2012-02-26 DIAGNOSIS — N949 Unspecified condition associated with female genital organs and menstrual cycle: Secondary | ICD-10-CM | POA: Insufficient documentation

## 2012-02-26 DIAGNOSIS — N10 Acute tubulo-interstitial nephritis: Secondary | ICD-10-CM | POA: Insufficient documentation

## 2012-02-26 DIAGNOSIS — R10819 Abdominal tenderness, unspecified site: Secondary | ICD-10-CM | POA: Insufficient documentation

## 2012-02-26 DIAGNOSIS — R109 Unspecified abdominal pain: Secondary | ICD-10-CM | POA: Insufficient documentation

## 2012-02-26 LAB — URINALYSIS, ROUTINE W REFLEX MICROSCOPIC
Nitrite: NEGATIVE
Protein, ur: 30 mg/dL — AB
Specific Gravity, Urine: 1.016 (ref 1.005–1.030)
Urobilinogen, UA: 1 mg/dL (ref 0.0–1.0)

## 2012-02-26 LAB — PREGNANCY, URINE: Preg Test, Ur: NEGATIVE

## 2012-02-26 LAB — URINE MICROSCOPIC-ADD ON

## 2012-02-26 MED ORDER — HYDROCODONE-ACETAMINOPHEN 5-500 MG PO TABS: 1.0000 | ORAL_TABLET | Freq: Four times a day (QID) | ORAL | Status: AC | PRN

## 2012-02-26 MED ORDER — CEFTRIAXONE SODIUM 1 G IJ SOLR
1.0000 g | Freq: Once | INTRAMUSCULAR | Status: AC
  Administered 2012-02-26: 1 g via INTRAMUSCULAR
  Filled 2012-02-26: qty 10

## 2012-02-26 MED ORDER — CEPHALEXIN 500 MG PO CAPS: 500.0000 mg | ORAL_CAPSULE | Freq: Four times a day (QID) | ORAL | Status: AC

## 2012-02-26 MED ORDER — OXYCODONE-ACETAMINOPHEN 5-325 MG PO TABS
1.0000 | ORAL_TABLET | Freq: Once | ORAL | Status: AC
  Administered 2012-02-26: 1 via ORAL
  Filled 2012-02-26: qty 1

## 2012-02-26 MED ORDER — DEXTROSE 5 % IV SOLN: 1.0000 g | Freq: Once | INTRAVENOUS | Status: DC

## 2012-02-26 NOTE — ED Notes (Signed)
MD at bedside. 

## 2012-02-26 NOTE — ED Provider Notes (Signed)
Medical screening examination/treatment/procedure(s) were performed by non-physician practitioner and as supervising physician I was immediately available for consultation/collaboration.  Ilya Neely R. Ranulfo Kall, MD 02/26/12 1542 

## 2012-02-26 NOTE — Discharge Instructions (Signed)
Your urine has infection in it, and I believe your pain is caused by this infection. Take keflex as prescribed until all gone for the infection. Take vicodin as prescribed as needed for pain, do not drive if taking. Follow up with your doctor to have your urine rechecked in 1 week or sooner if symptoms not improving. Return if worsening.   Pyelonephritis, Adult Pyelonephritis is a kidney infection. In general, there are 2 main types of pyelonephritis:  Infections that come on quickly without any warning (acute pyelonephritis).   Infections that persist for a long period of time (chronic pyelonephritis).  CAUSES  Two main causes of pyelonephritis are:  Bacteria traveling from the bladder to the kidney. This is a problem especially in pregnant women. The urine in the bladder can become filled with bacteria from multiple causes, including:   Inflammation of the prostate gland (prostatitis).   Sexual intercourse in females.   Bladder infection (cystitis).   Bacteria traveling from the bloodstream to the tissue part of the kidney.  Problems that may increase your risk of getting a kidney infection include:  Diabetes.   Kidney stones or bladder stones.   Cancer.   Catheters placed in the bladder.   Other abnormalities of the kidney or ureter.  SYMPTOMS   Abdominal pain.   Pain in the side or flank area.   Fever.   Chills.   Upset stomach.   Blood in the urine ( urine).   Frequent urination.   Strong or persistent urge to urinate.   Burning or stinging when urinating.  DIAGNOSIS  Your caregiver may diagnose your kidney infection based on your symptoms. A urine sample may also be taken. TREATMENT  In general, treatment depends on how severe the infection is.   If the infection is mild and caught early, your caregiver may treat you with oral antibiotics and send you home.   If the infection is more severe, the bacteria may have gotten into the bloodstream. This  will require intravenous (IV) antibiotics and a hospital stay. Symptoms may include:   High fever.   Severe flank pain.   Shaking chills.   Even after a hospital stay, your caregiver may require you to be on oral antibiotics for a period of time.   Other treatments may be required depending upon the cause of the infection.  HOME CARE INSTRUCTIONS   Take your antibiotics as directed. Finish them even if you start to feel better.   Make an appointment to have your urine checked to make sure the infection is gone.   Drink enough fluids to keep your urine clear or pale yellow.   Take medicines for the bladder if you have urgency and frequency of urination as directed by your caregiver.  SEEK IMMEDIATE MEDICAL CARE IF:   You have a fever.   You are unable to take your antibiotics or fluids.   You develop shaking chills.   You experience extreme weakness or fainting.   There is no improvement after 2 days of treatment.  MAKE SURE YOU:  Understand these instructions.   Will watch your condition.   Will get help right away if you are not doing well or get worse.  Document Released: 11/27/2005 Document Revised: 11/16/2011 Document Reviewed: 05/03/2011 [facility] Patient Information 2012 [place], [place].

## 2012-02-26 NOTE — ED Notes (Signed)
[facility] Given abx and depo and sts now has right flank pain and abd pain.

## 2012-02-26 NOTE — ED Provider Notes (Signed)
History     CSN: [id number]  Arrival date & time 02/26/12  5621   First MD Initiated Contact with Patient 02/26/12 [phone]      Chief Complaint  Patient presents with  . Abdominal Pain    (Consider location/radiation/quality/duration/timing/severity/associated sxs/prior treatment) Patient is a 36 y.o. female presenting with abdominal pain. The history is provided by the patient.  Abdominal Pain The primary symptoms of the illness include abdominal pain and dysuria. The primary symptoms of the illness do not include fever, nausea, vomiting, diarrhea, vaginal discharge or vaginal bleeding. The current episode started more than 2 days ago. The problem has not changed since onset. The dysuria is not associated with vaginal pain.  Symptoms associated with the illness do not include chills or constipation.  Pt states she has had lower abdominal pain for several weeks now. Was also having heavy vaginal bleeding, which now resolved. [facility]. States wants something done about them. Denies change in pain except for it is now radiating into right back. Admits to burning with urination 3 days ago, but state took 3 days of pills she bought over the counter which made her urine turn orange. Pt [facility] also [facility], states she finished antibiotic. Denies fever, chills, nausea, vomiting, problems with bowels, vagina discharge or bleeding.  Past Medical History  Diagnosis Date  . Anemia   . Asthma   . Blood transfusion 2011    r/t anemia  . Migraine   . Sickle cell trait   . Seizures     last sz Jan 2012    Past Surgical History  Procedure Date  . Tubal ligation 2005    Family History  Problem Relation Age of Onset  . Anesthesia problems Neg Hx     History  Substance Use Topics  . Smoking status: Current Some Day Smoker  . Smokeless tobacco: Not on file  . Alcohol Use: No    OB History    Grav Para  Term Preterm Abortions TAB SAB Ect Mult Living   3 2 1 1 1  0 0 1 0 2      Review of Systems  Constitutional: Negative for fever and chills.  HENT: Negative.   Eyes: Negative.   Respiratory: Negative.   Cardiovascular: Negative.   Gastrointestinal: Positive for abdominal pain. Negative for nausea, vomiting, diarrhea, constipation and blood in stool.  Genitourinary: Positive for dysuria, flank pain and pelvic pain. Negative for vaginal bleeding, vaginal discharge, genital sores and vaginal pain.  Musculoskeletal: Negative.   Skin: Negative.   Neurological: Negative.   Psychiatric/Behavioral: Negative.     Allergies  Penicillins and Sulfa antibiotics  Home Medications   Current Outpatient Rx  Name Route Sig Dispense Refill  . ALBUTEROL SULFATE HFA 108 (90 BASE) MCG/ACT IN AERS Inhalation Inhale 2 puffs into the lungs every 6 (six) hours as needed. Shortness of breath     . CARBAMAZEPINE 200 MG PO TABS Oral Take 100 mg by mouth 2 (two) times daily.      Marland Kitchen FERROUS SULFATE 325 (65 FE) MG PO TABS Oral Take 325 mg by mouth daily with breakfast.      . HYDROCODONE-ACETAMINOPHEN 5-325 MG PO TABS Oral Take 1 tablet by mouth every 6 (six) hours as needed. Patient used this medication for her migraine.    . IBUPROFEN 200 MG PO TABS Oral Take 400 mg by mouth every 6 (six) hours as needed. Patient used  this medication for pain.    Marland Kitchen PHENYTOIN SODIUM EXTENDED 100 MG PO CAPS Oral Take 100 mg by mouth 4 (four) times daily.     . TOPIRAMATE 50 MG PO TABS Oral Take 50 mg by mouth 2 (two) times daily.        BP 104/62  Pulse 86  Temp(Src) 98.5 F (36.9 C) (Oral)  Resp 16  SpO2 99%  LMP 01/21/2012  Physical Exam  Nursing note and vitals reviewed. Constitutional: She is oriented to person, place, and time. She appears well-developed and well-nourished.  Eyes: Conjunctivae are normal.  Cardiovascular: Normal rate, regular rhythm and normal heart sounds.   Pulmonary/Chest: Effort normal and  breath sounds normal. No respiratory distress.  Abdominal: Soft. Bowel sounds are normal. She exhibits no mass.       Suprapubic tenderness, no guarding, no rebound tenderness. Right CVA tenderness.   Musculoskeletal: Normal range of motion.  Neurological: She is alert and oriented to person, place, and time.  Skin: Skin is warm and dry.  Psychiatric: She has a normal mood and affect.    ED Course  Procedures (including critical care time)  Pt with suprapubic abdominal pain, right flank pain, dysuria. Recently treated for bacterial vaginosis. Diagnosed with fibroids. Will check UA. Pt denies abnormal vaginal discharge or bleeding.Marland Kitchen Results for orders placed during the hospital encounter of 02/26/12  URINALYSIS, ROUTINE W REFLEX MICROSCOPIC      Component Value Range   Color, Urine YELLOW  YELLOW    APPearance CLOUDY (*) CLEAR    Specific Gravity, Urine 1.016  1.005 - 1.030    pH 6.5  5.0 - 8.0    Glucose, UA NEGATIVE  NEGATIVE (mg/dL)   Hgb urine dipstick MODERATE (*) NEGATIVE    Bilirubin Urine NEGATIVE  NEGATIVE    Ketones, ur NEGATIVE  NEGATIVE (mg/dL)   Protein, ur 30 (*) NEGATIVE (mg/dL)   Urobilinogen, UA 1.0  0.0 - 1.0 (mg/dL)   Nitrite NEGATIVE  NEGATIVE    Leukocytes, UA LARGE (*) NEGATIVE   PREGNANCY, URINE      Component Value Range   Preg Test, Ur NEGATIVE  NEGATIVE   URINE MICROSCOPIC-ADD ON      Component Value Range   WBC, UA TOO NUMEROUS TO COUNT  <3 (WBC/hpf)   RBC / HPF TOO NUMEROUS TO COUNT  <3 (RBC/hpf)   Bacteria, UA MANY (*) RARE    Todays urine shows TNTC WBCs and many bacteria. Will treat as pyelonephritis. Pt is otherwise non toxic. She is afebrile, no n/v. I think appropriate to treat as an outpatient. No pelvic exam performed since no complaints and [facility]     No diagnosis found.    MDM          Lottie Mussel, PA 02/26/12 1052

## 2012-02-26 NOTE — ED Notes (Signed)
Unable to obtain IV access.

## 2012-02-28 LAB — URINE CULTURE: Colony Count: >=100000

## 2012-02-29 NOTE — ED Notes (Signed)
+   Urine. Patient treated with keflex. Sensitive to same. Per protocol MD. 

## 2012-03-04 IMAGING — CR DG CHEST 2V
2 series · 2 of 2 positions shown · non-contrast
Comparison: 03/09/2011

CLINICAL DATA: Shortness of breath.

CHEST - 2 VIEW

[w chest pa]
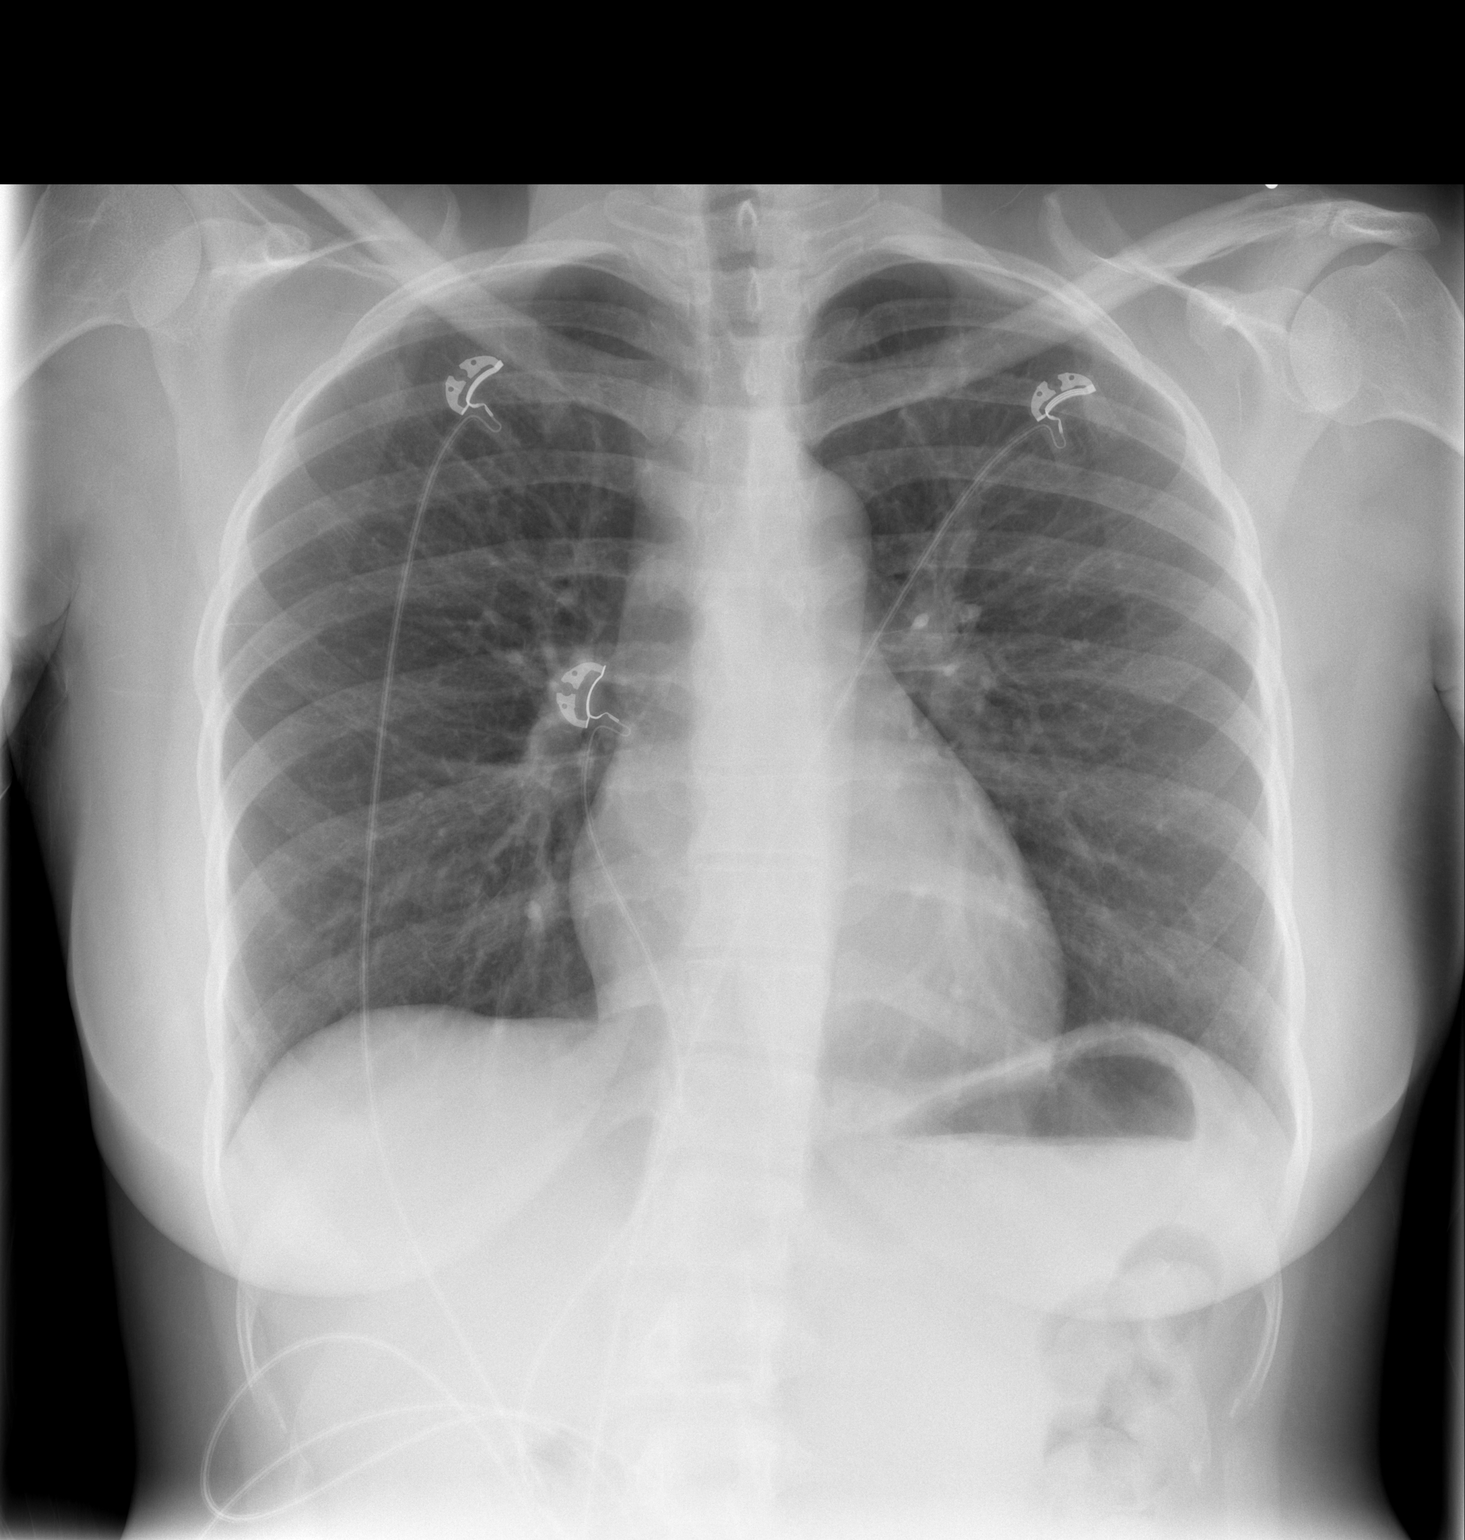

[w chest lat]
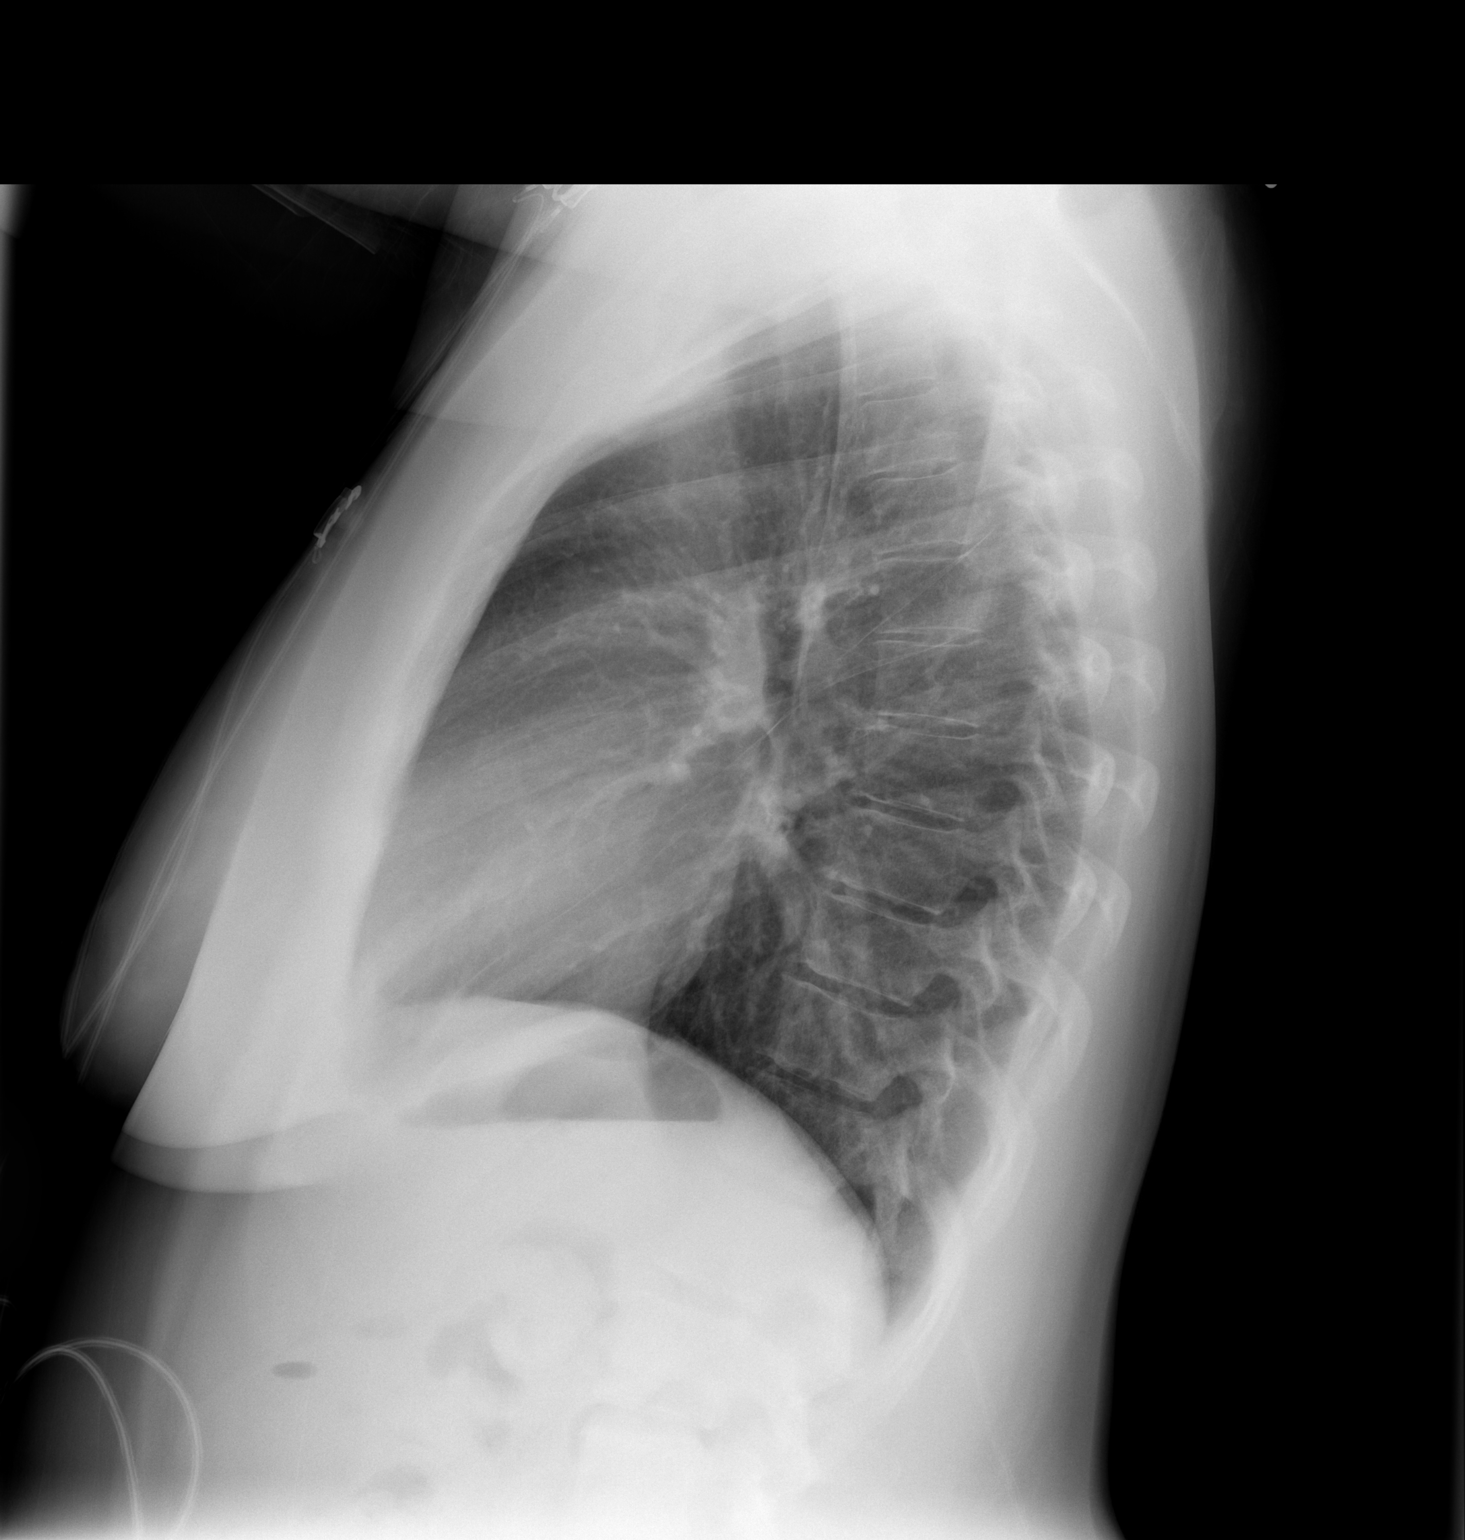

[2 of 2 positions shown; findings below may reference images not displayed]

FINDINGS: Lungs are clear. No pleural effusion or pneumothorax. The
cardiomediastinal contours are within normal limits. The visualized
bones and soft tissues are without significant appreciable
abnormality.
IMPRESSION: No acute cardiopulmonary process.

## 2012-03-18 IMAGING — CR DG CHEST 2V
2 series · 2 of 2 positions shown · non-contrast
Comparison: Chest x-ray of 07/07/2011

CLINICAL DATA: Mid chest pain radiating to the back, productive
cough, shortness of breath

CHEST - 2 VIEW

[w chest pa]
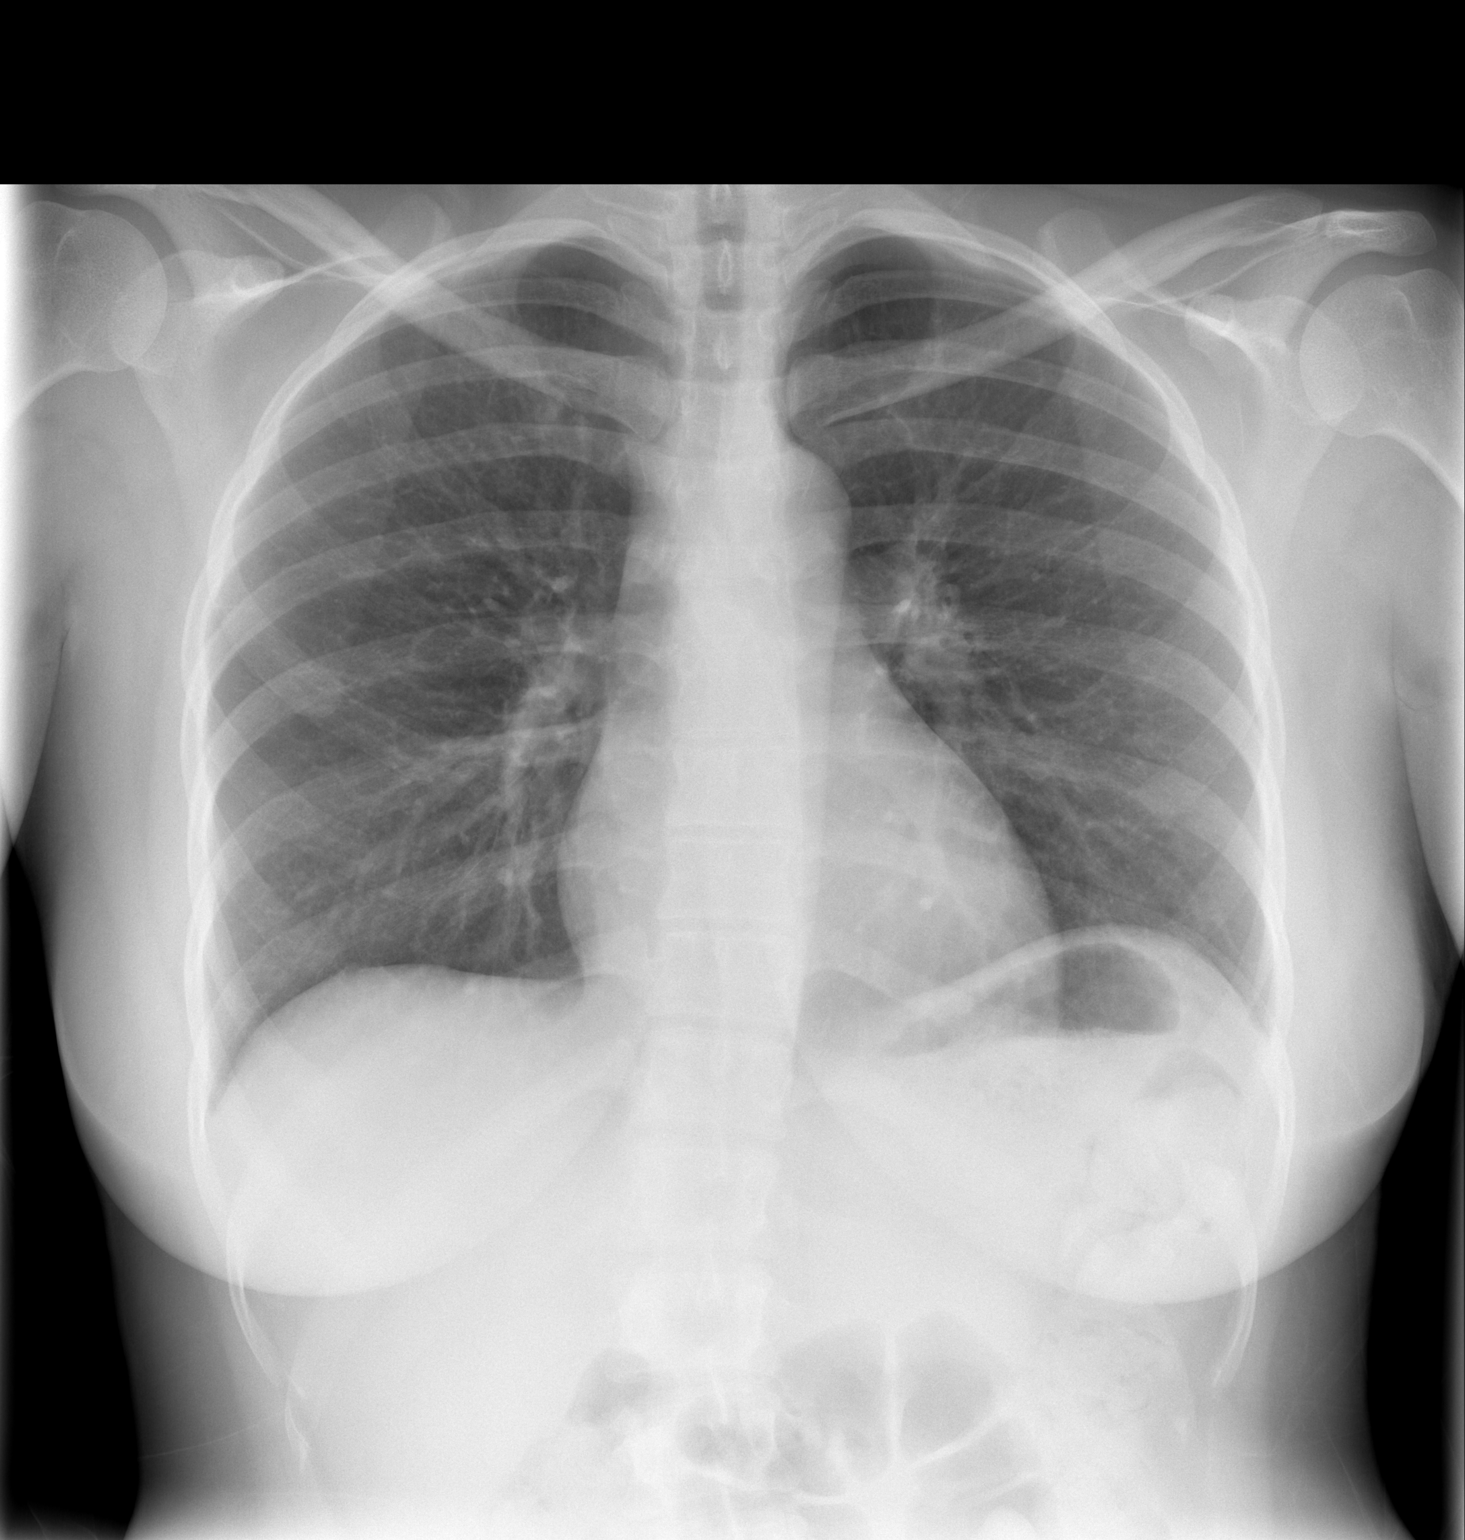

[w chest lat]
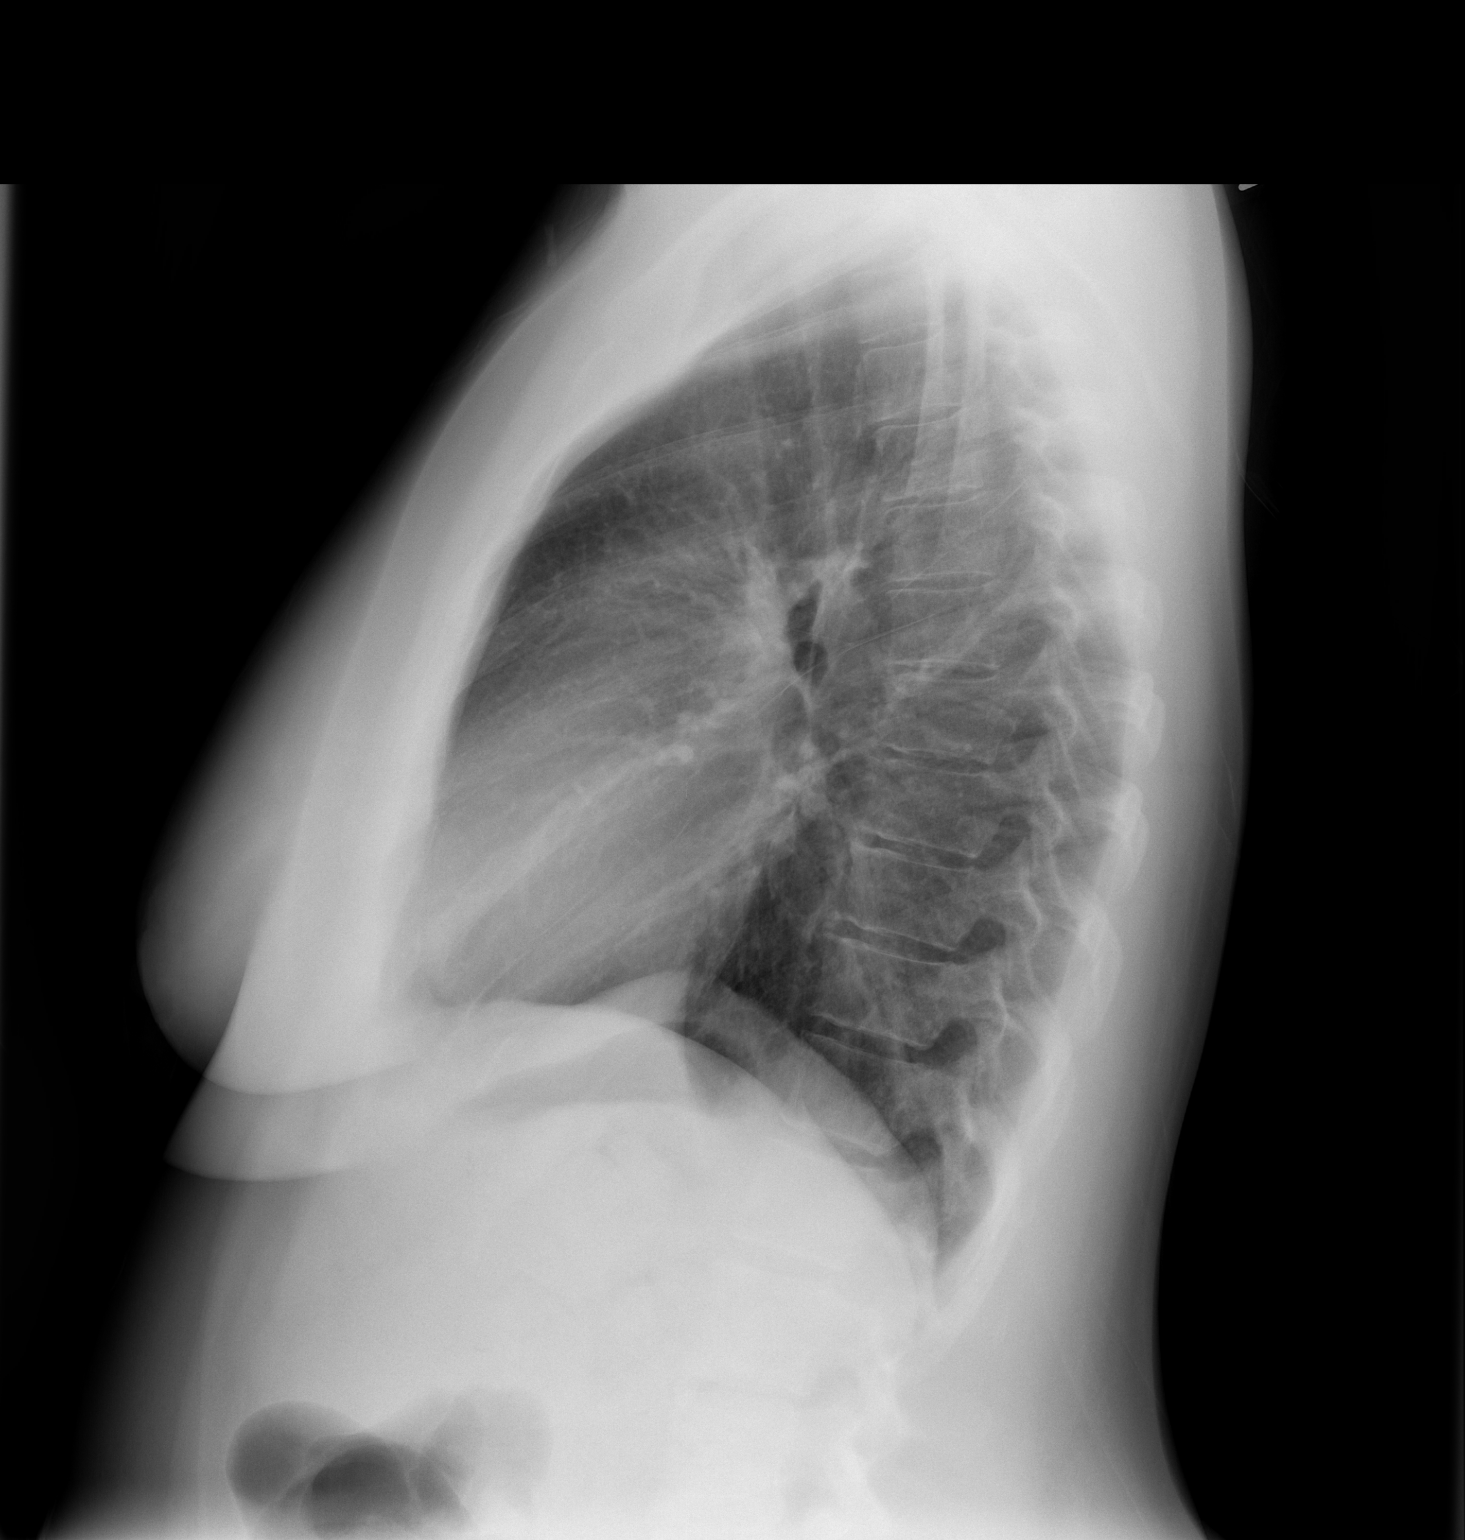

[2 of 2 positions shown; findings below may reference images not displayed]

FINDINGS: The lungs are clear.  No pneumothorax is seen.
Mediastinal contours appear normal.  The heart is within normal
limits in size.  No bony abnormality is seen.
IMPRESSION: No active lung disease.

## 2012-04-07 ENCOUNTER — Emergency Department (HOSPITAL_COMMUNITY): Payer: Self-pay

## 2012-04-07 ENCOUNTER — Encounter (HOSPITAL_COMMUNITY): Payer: Self-pay | Admitting: *Deleted

## 2012-04-07 ENCOUNTER — Emergency Department (HOSPITAL_COMMUNITY)
Admission: EM | Admit: 2012-04-07 | Discharge: 2012-04-07 | Disposition: A | Discharge status: Home / Self Care | Payer: Self-pay | Attending: Emergency Medicine | Admitting: Emergency Medicine

## 2012-04-07 DIAGNOSIS — J45909 Unspecified asthma, uncomplicated: Secondary | ICD-10-CM | POA: Insufficient documentation

## 2012-04-07 DIAGNOSIS — R569 Unspecified convulsions: Secondary | ICD-10-CM | POA: Insufficient documentation

## 2012-04-07 DIAGNOSIS — M25519 Pain in unspecified shoulder: Secondary | ICD-10-CM | POA: Insufficient documentation

## 2012-04-07 DIAGNOSIS — F17200 Nicotine dependence, unspecified, uncomplicated: Secondary | ICD-10-CM | POA: Insufficient documentation

## 2012-04-07 IMAGING — US US ABDOMEN COMPLETE
1 series · 14 of 25 positions shown · non-contrast
Comparison: None

CLINICAL DATA: Abdominal pain.

ABDOMEN ULTRASOUND
TECHNIQUE: Complete abdominal ultrasound examination was performed
including evaluation of the liver, gallbladder, bile ducts,
pancreas, kidneys, spleen, IVC, and abdominal aorta.

[Series 1: us abdomen complete · 0.28mm/px · 14 of 59 slices shown]
[im 1/59]
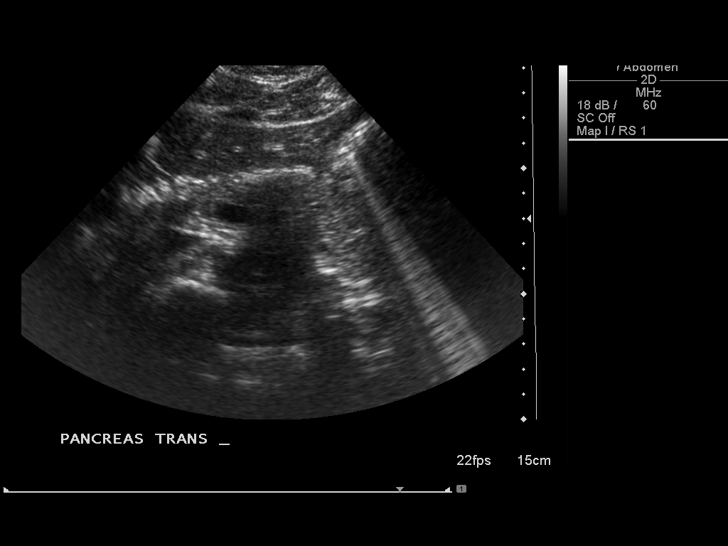
[im 5/59]
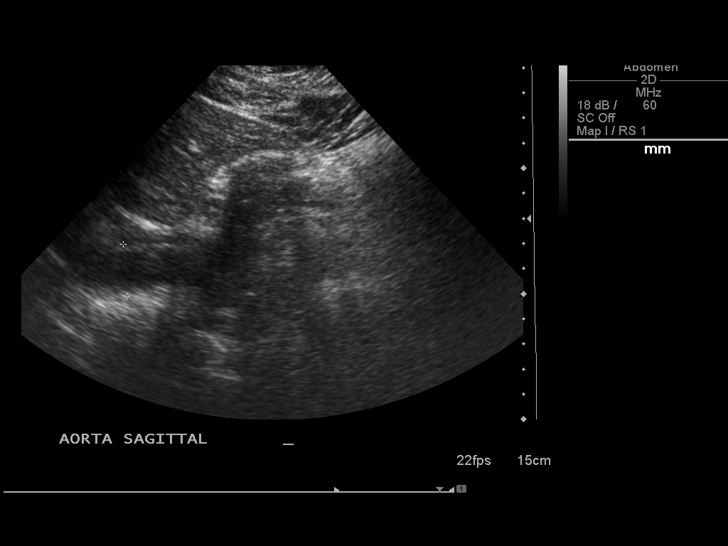
[im 10/59]
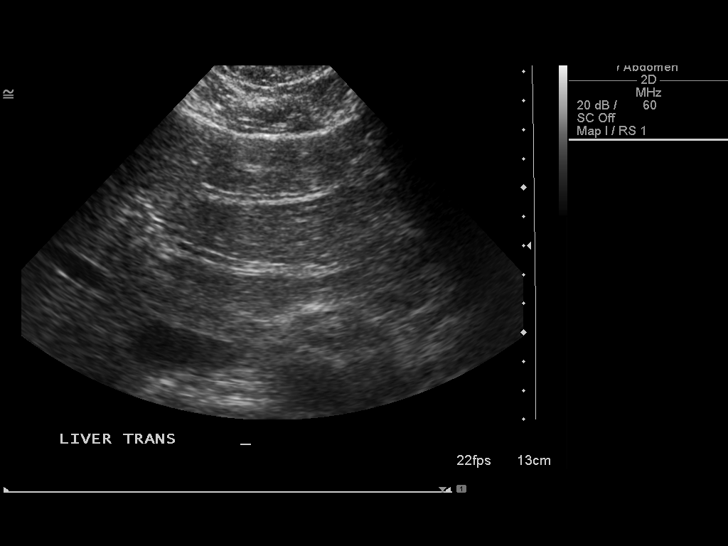
[im 15/59]
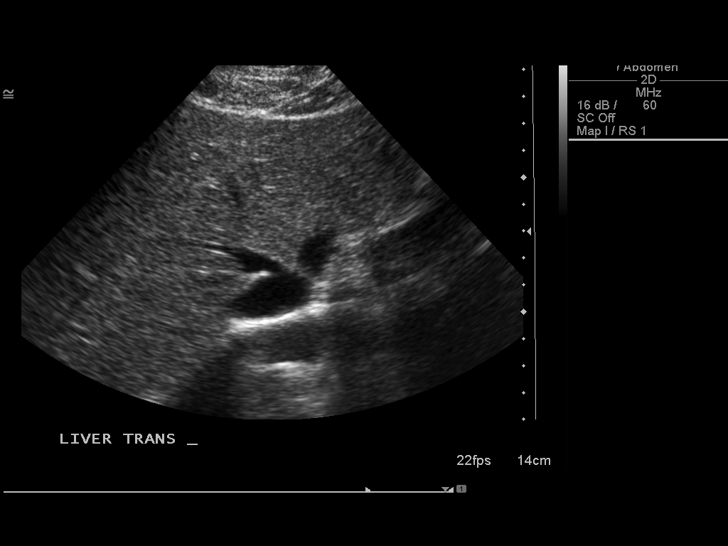
[im 20/59]
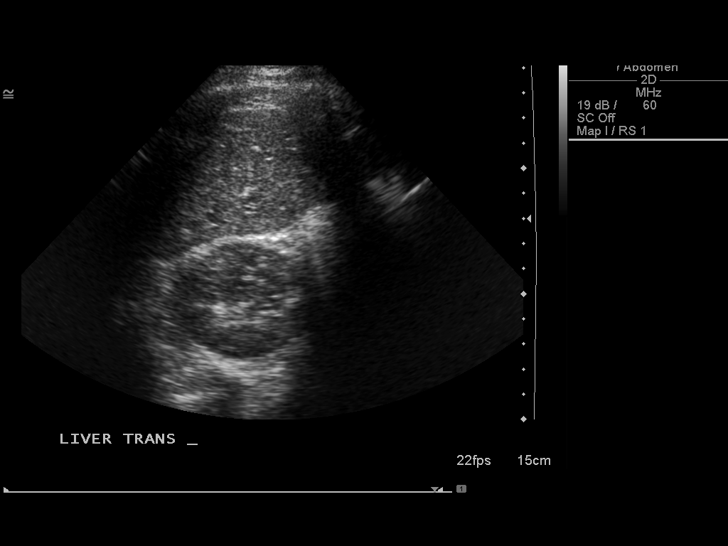
[im 22/59]
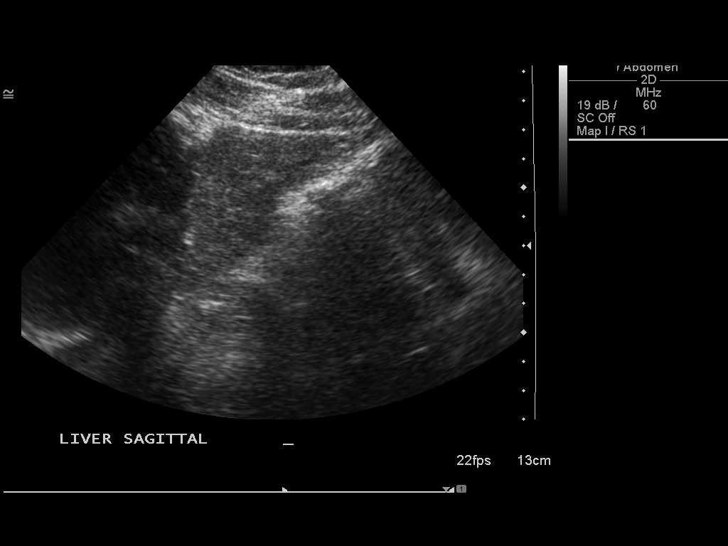
[im 27/59]
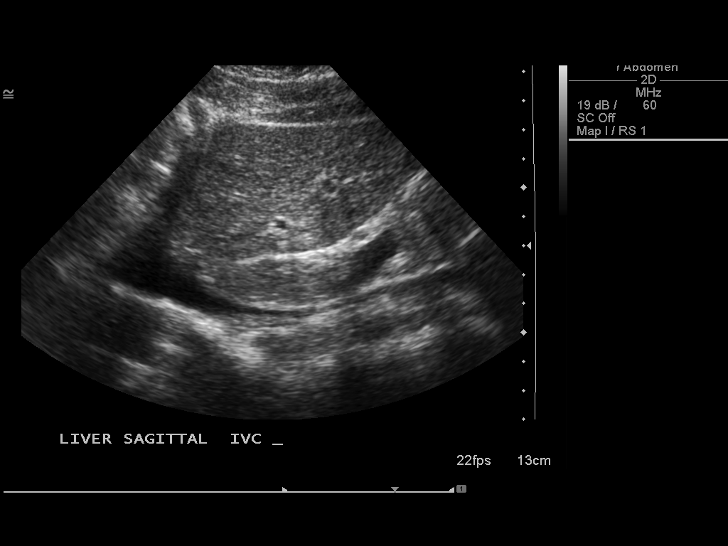
[im 32/59]
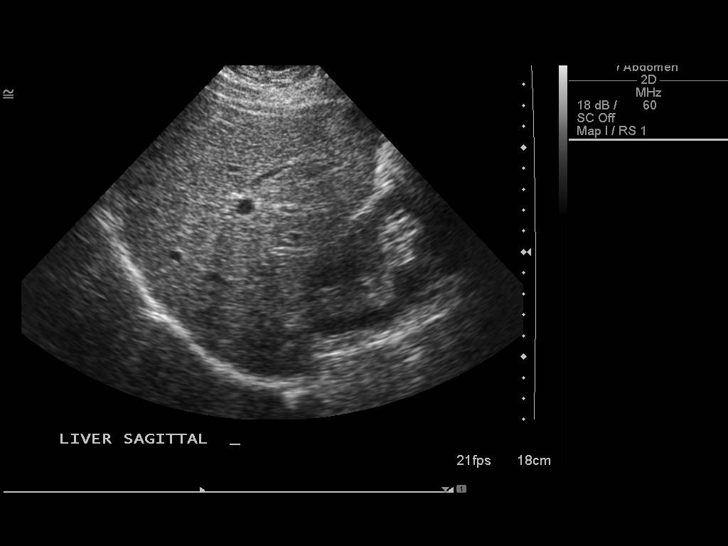
[im 37/59]
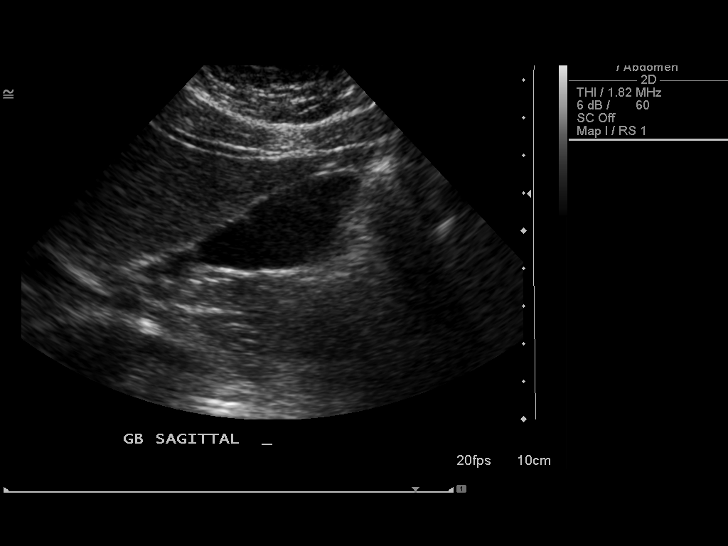
[im 39/59]
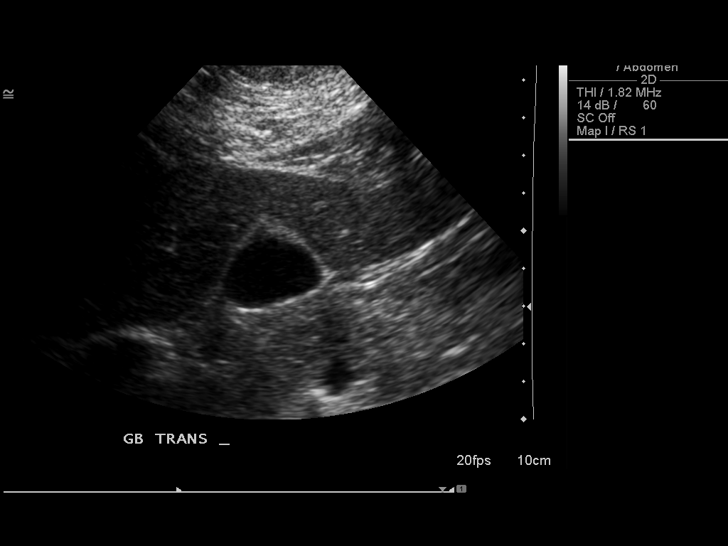
[im 44/59]
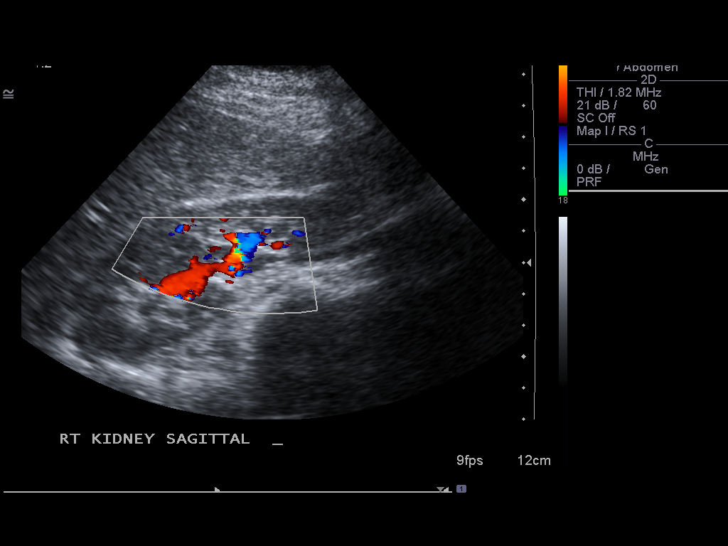
[im 49/59]
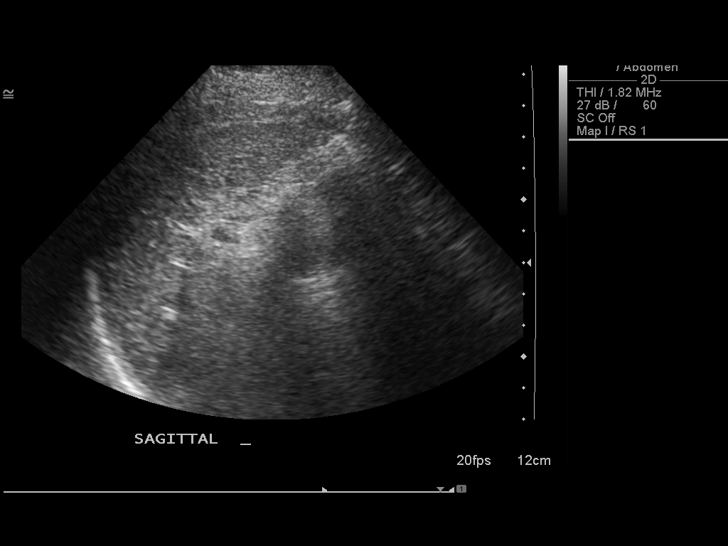
[im 54/59]
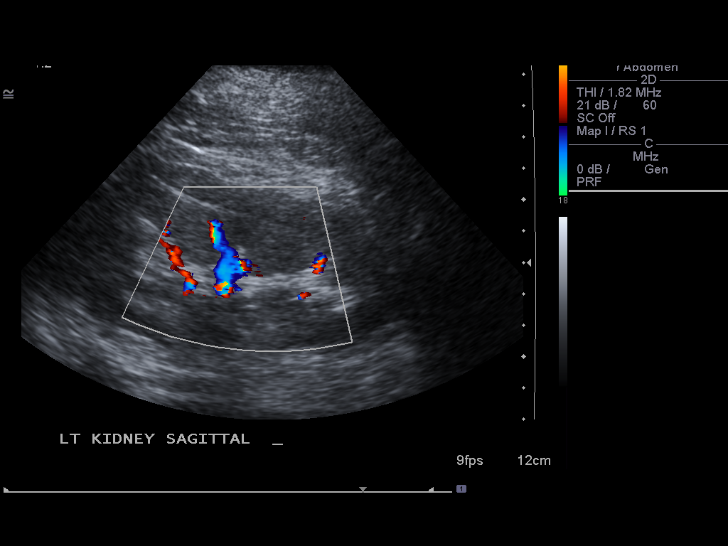
[im 59/59]
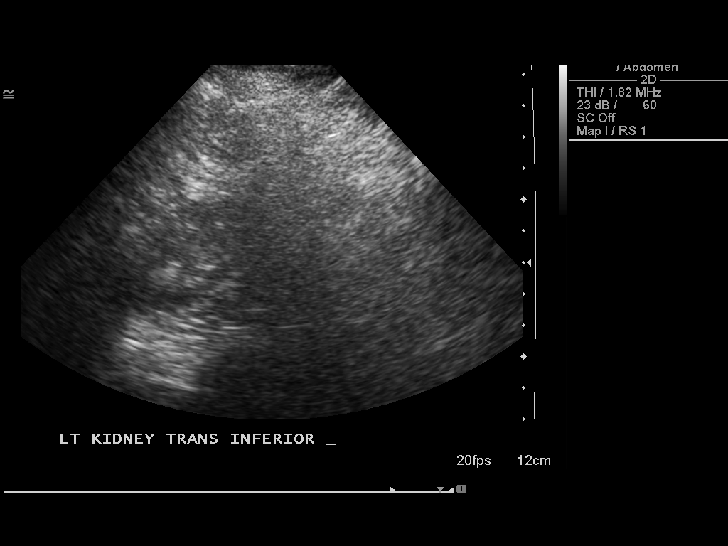

[14 of 25 positions shown; findings below may reference images not displayed]

FINDINGS: Gallbladder:  The No shadowing gallstones or echogenic sludge.  No
gallbladder wall thickening or pericholecystic fluid.  Negative
sonographic Murphy's sign according to the ultrasound technologist.

Common Bile Duct:  Normal caliber of 2 mm.

Liver:  Normal size and echotexture without focal parenchymal
abnormality.  Patent portal vein with hepatopetal flow.

IVC:  Patent throughout its visualized course in the abdomen.

Pancreas:  Very limited visualization of the pancreas.  No overt
abnormalities.

Spleen:  The spleen is of normal echotexture and size.

Kidneys:  Both kidneys are normal in sonographic appearance without
obstruction or focal lesion.  The right kidney measures 11.1 cm and
the left kidney 11.6 cm.

Abdominal Aorta:  Normal caliber visualized aorta.  The distal
aorta is obscured by bowel gas.
IMPRESSION: Normal abdominal ultrasound.  Poor visualization of pancreas.

## 2012-04-07 MED ORDER — HYDROCODONE-ACETAMINOPHEN 5-500 MG PO TABS: 1.0000 | ORAL_TABLET | Freq: Four times a day (QID) | ORAL | Status: AC | PRN

## 2012-04-07 MED ORDER — HYDROCODONE-ACETAMINOPHEN 5-325 MG PO TABS
1.0000 | ORAL_TABLET | Freq: Once | ORAL | Status: AC
  Administered 2012-04-07: 1 via ORAL
  Filled 2012-04-07: qty 1

## 2012-04-07 MED ORDER — IBUPROFEN 600 MG PO TABS: 600.0000 mg | ORAL_TABLET | Freq: Four times a day (QID) | ORAL | Status: AC | PRN

## 2012-04-07 MED ORDER — IBUPROFEN 200 MG PO TABS
600.0000 mg | ORAL_TABLET | Freq: Once | ORAL | Status: AC
  Administered 2012-04-07: 600 mg via ORAL
  Filled 2012-04-07: qty 3

## 2012-04-07 NOTE — ED Notes (Signed)
Reports hearing pop in left shoulder last night and now having pain.

## 2012-04-07 NOTE — Discharge Instructions (Signed)
Take motrin as need for pain. You may also take vicodin as need for pain. No driving for the next 6 hours or when taking vicodin. Also, do not take tylenol or acetaminophen containing medication when taking vicodin.  Follow up with primary care doctor in 1 week if symptoms fail to improve/resolve. Return to ER if worse, severe pain, numbness/weakness, fevers, other concern.        Arthralgia Your caregiver has diagnosed you as suffering from an arthralgia. Arthralgia means there is pain in a joint. This can come from many reasons including:  Bruising the joint which causes soreness (inflammation) in the joint.   Wear and tear on the joints which occur as we grow older (osteoarthritis).   Overusing the joint.   Various forms of arthritis.   Infections of the joint.  Regardless of the cause of pain in your joint, most of these different pains respond to anti-inflammatory drugs and rest. The exception to this is when a joint is infected, and these cases are treated with antibiotics, if it is a bacterial infection. HOME CARE INSTRUCTIONS   Rest the injured area for as long as directed by your caregiver. Then slowly start using the joint as directed by your caregiver and as the pain allows. Crutches as directed may be useful if the ankles, knees or hips are involved. If the knee was splinted or casted, continue use and care as directed. If an stretchy or elastic wrapping bandage has been applied today, it should be removed and re-applied every 3 to 4 hours. It should not be applied tightly, but firmly enough to keep swelling down. Watch toes and feet for swelling, bluish discoloration, coldness, numbness or excessive pain. If any of these problems (symptoms) occur, remove the ace bandage and re-apply more loosely. If these symptoms persist, contact your caregiver or return to this location.   For the first 24 hours, keep the injured extremity elevated on pillows while lying down.   Apply ice  for 15 to 20 minutes to the sore joint every couple hours while awake for the first half day. Then 3 to 4 times per day for the first 48 hours. Put the ice in a plastic bag and place a towel between the bag of ice and your skin.   Wear any splinting, casting, elastic bandage applications, or slings as instructed.   Only take over-the-counter or prescription medicines for pain, discomfort, or fever as directed by your caregiver. Do not use aspirin immediately after the injury unless instructed by your physician. Aspirin can cause increased bleeding and bruising of the tissues.   If you were given crutches, continue to use them as instructed and do not resume weight bearing on the sore joint until instructed.  Persistent pain and inability to use the sore joint as directed for more than 2 to 3 days are warning signs indicating that you should see a caregiver for a follow-up visit as soon as possible. Initially, a hairline fracture (break in bone) may not be evident on X-rays. Persistent pain and swelling indicate that further evaluation, non-weight bearing or use of the joint (use of crutches or slings as instructed), or further X-rays are indicated. X-rays may sometimes not show a small fracture until a week or 10 days later. Make a follow-up appointment with your own caregiver or one to whom we have referred you. A radiologist (specialist in reading X-rays) may read your X-rays. Make sure you know how you are to obtain your X-ray results.  Do not assume everything is normal if you do not hear from [place]. SEEK MEDICAL CARE IF: Bruising, swelling, or pain increases. SEEK IMMEDIATE MEDICAL CARE IF:   Your fingers or toes are numb or blue.   The pain is not responding to medications and continues to stay the same or get worse.   The pain in your joint becomes severe.   You develop a fever over 102 F (38.9 C).   It becomes impossible to move or use the joint.  MAKE SURE YOU:   Understand these  instructions.   Will watch your condition.   Will get help right away if you are not doing well or get worse.  Document Released: 11/27/2005 Document Revised: 11/16/2011 Document Reviewed: 07/15/2008 [facility] Patient Information 2012 [place], [place].      Shoulder Pain The shoulder is a ball and socket joint. The muscles and tendons (rotator cuff) are what keep the shoulder in its joint and stable. This collection of muscles and tendons holds in the head (ball) of the humerus (upper arm bone) in the fossa (cup) of the scapula (shoulder blade). Today no reason was found for your shoulder pain. Often pain in the shoulder may be treated conservatively with temporary immobilization. For example, holding the shoulder in one place using a sling for rest. Physical therapy may be needed if problems continue. HOME CARE INSTRUCTIONS   Apply ice to the sore area for 15 to 20 minutes, 3 to 4 times per day for the first 2 days. Put the ice in a plastic bag. Place a towel between the bag of ice and your skin.   If you have or were given a shoulder sling and straps, do not remove for as long as directed by your caregiver or until you see a caregiver for a follow-up examination. If you need to remove it to shower or bathe, move your arm as little as possible.   Sleep on several pillows at night to lessen swelling and pain.   Only take over-the-counter or prescription medicines for pain, discomfort, or fever as directed by your caregiver.   Keep any follow-up appointments in order to avoid any type of permanent shoulder disability or chronic pain problems.  SEEK MEDICAL CARE IF:   Pain in your shoulder increases or new pain develops in your arm, hand, or fingers.   Your hand or fingers are colder than your other hand.   You do not obtain pain relief with the medications or your pain becomes worse.  SEEK IMMEDIATE MEDICAL CARE IF:   Your arm, hand, or fingers are numb or tingling.   Your arm, hand,  or fingers are swollen, painful, or turn white or blue.   You develop chest pain or shortness of breath.  MAKE SURE YOU:   Understand these instructions.   Will watch your condition.   Will get help right away if you are not doing well or get worse.  Document Released: 09/06/2005 Document Revised: 11/16/2011 Document Reviewed: 11/11/2011 [facility] Patient Information 2012 [place], [place].

## 2012-04-07 NOTE — ED Provider Notes (Signed)
History     CSN: [id number]  Arrival date & time 04/07/12  1036   First MD Initiated Contact with Patient 04/07/12 1128      Chief Complaint  Patient presents with  . Shoulder Pain    (Consider location/radiation/quality/duration/timing/severity/associated sxs/prior treatment) The history is provided by the patient.  pt c/o left shoulder pain for past day. Constant. Dull. Non radiating. Right handed. Says had done some lifting/tv but no specific injury recalled. Denies prior injury. No neck pain or radicular pain. No numbness/weakness. No skin changes or sts. No numbness/weakness. Says hears it popping w certain movements. mp fever pr cjo;;s/   Past Medical History  Diagnosis Date  . Anemia   . Asthma   . Blood transfusion 2011    r/t anemia  . Migraine   . Sickle cell trait   . Seizures     last sz Jan 2012    Past Surgical History  Procedure Date  . Tubal ligation 2005    Family History  Problem Relation Age of Onset  . Anesthesia problems Neg Hx     History  Substance Use Topics  . Smoking status: Current Some Day Smoker  . Smokeless tobacco: Not on file  . Alcohol Use: No    OB History    Grav Para Term Preterm Abortions TAB SAB Ect Mult Living   3 2 1 1 1  0 0 1 0 2      Review of Systems  Constitutional: Negative for fever.  HENT: Negative for neck pain.   Respiratory: Negative for shortness of breath.   Cardiovascular: Negative for chest pain.  Musculoskeletal: Negative for back pain.  Skin: Negative for rash.  Neurological: Negative for weakness and numbness.  Hematological: Negative for adenopathy.    Allergies  Penicillins and Sulfa antibiotics  Home Medications   Current Outpatient Rx  Name Route Sig Dispense Refill  . ALBUTEROL SULFATE HFA 108 (90 BASE) MCG/ACT IN AERS Inhalation Inhale 2 puffs into the lungs every 6 (six) hours as needed. Shortness of breath     . CARBAMAZEPINE 200 MG PO TABS Oral Take 100 mg by mouth 2 (two) times  daily.      Marland Kitchen FERROUS SULFATE 325 (65 FE) MG PO TABS Oral Take 325 mg by mouth daily with breakfast.      . MICONAZOLE NITRATE 2 % VA CREA Vaginal Place 1 applicator vaginally at bedtime.    Marland Kitchen PHENYTOIN SODIUM EXTENDED 100 MG PO CAPS Oral Take 100 mg by mouth 4 (four) times daily.     . TOPIRAMATE 50 MG PO TABS Oral Take 50 mg by mouth 2 (two) times daily.        BP 105/64  Pulse 85  Temp(Src) 98.1 F (36.7 C) (Oral)  Resp 16  SpO2 97%  LMP 02/08/2012  Physical Exam  Nursing note and vitals reviewed. Constitutional: She is oriented to person, place, and time. She appears well-developed and well-nourished. No distress.  Eyes: Conjunctivae are normal. No scleral icterus.  Neck: Neck supple. No tracheal deviation present.  Cardiovascular: Normal rate.   Pulmonary/Chest: Effort normal. No respiratory distress.  Abdominal: Normal appearance. She exhibits no distension.  Musculoskeletal: She exhibits no edema and no tenderness.       Ct spine non tender, aligned, no step off. Left shoulder good passive rom without pain. No sts. No erythema or skin changes. Radial pulse 2+. Tenderness diffusely to shoulder esp anteriorly.  No deformity.   Neurological: She is alert  and oriented to person, place, and time.       Motor intact/LUE nvi.   Skin: Skin is warm and dry. No rash noted.  Psychiatric: She has a normal mood and affect.    ED Course  Procedures (including critical care time)  Labs Reviewed - No data to display Dg Shoulder Left  04/07/2012  *RADIOLOGY REPORT*  Clinical Data: Left shoulder pain.  LEFT SHOULDER - 2+ VIEW  Comparison:  None.  Findings:  There is no evidence of fracture or dislocation.  There is no evidence of arthropathy or other focal bone abnormality. Soft tissues are unremarkable.  IMPRESSION: Negative.  Original Report Authenticated By: Reola Calkins, M.D.        MDM  Pt says has ride, does not have to drive.   No meds pta. Motrin po vicodin 1 po.    xrays negs. Discussed w pt.         Suzi Roots, MD 04/07/12 1136

## 2012-04-07 NOTE — ED Notes (Addendum)
Pt c/o (L) shoulder pain starting last night approx 2300, pt reports she was lifting a TV last night, while doing laundry last night she felt her shoulder "pop" and is now unable to lift her (L) arm above her (L) shoulder. Pt is able to lift (L) arm w/difficulty.

## 2012-04-26 ENCOUNTER — Encounter (HOSPITAL_COMMUNITY): Payer: Self-pay | Admitting: Emergency Medicine

## 2012-04-26 ENCOUNTER — Emergency Department (HOSPITAL_COMMUNITY)
Admission: EM | Admit: 2012-04-26 | Discharge: 2012-04-26 | Disposition: A | Discharge status: Home Health | Payer: Self-pay | Attending: Emergency Medicine | Admitting: Emergency Medicine

## 2012-04-26 DIAGNOSIS — IMO0001 Reserved for inherently not codable concepts without codable children: Secondary | ICD-10-CM | POA: Insufficient documentation

## 2012-04-26 DIAGNOSIS — N949 Unspecified condition associated with female genital organs and menstrual cycle: Secondary | ICD-10-CM | POA: Insufficient documentation

## 2012-04-26 DIAGNOSIS — M791 Myalgia, unspecified site: Secondary | ICD-10-CM

## 2012-04-26 DIAGNOSIS — N938 Other specified abnormal uterine and vaginal bleeding: Secondary | ICD-10-CM | POA: Insufficient documentation

## 2012-04-26 DIAGNOSIS — J45909 Unspecified asthma, uncomplicated: Secondary | ICD-10-CM | POA: Insufficient documentation

## 2012-04-26 LAB — DIFFERENTIAL
Basophils Absolute: 0.1 10*3/uL (ref 0.0–0.1)
Basophils Relative: 1 % (ref 0–1)
Eosinophils Absolute: 0.4 10*3/uL (ref 0.0–0.7)
Monocytes Relative: 5 % (ref 3–12)
Neutrophils Relative %: 34 % — ABNORMAL LOW (ref 43–77)

## 2012-04-26 LAB — POCT I-STAT, CHEM 8
Glucose, Bld: 95 mg/dL (ref 70–99)
HCT: 30 % — ABNORMAL LOW (ref 36.0–46.0)
Hemoglobin: 10.2 g/dL — ABNORMAL LOW (ref 12.0–15.0)
Potassium: 3.6 mEq/L (ref 3.5–5.1)
Sodium: 143 mEq/L (ref 135–145)

## 2012-04-26 LAB — URINALYSIS, ROUTINE W REFLEX MICROSCOPIC
Ketones, ur: NEGATIVE mg/dL
Nitrite: NEGATIVE
Protein, ur: NEGATIVE mg/dL
Urobilinogen, UA: 0.2 mg/dL (ref 0.0–1.0)

## 2012-04-26 LAB — CBC
MCH: 23 pg — ABNORMAL LOW (ref 26.0–34.0)
MCHC: 31.8 g/dL (ref 30.0–36.0)
Platelets: 268 10*3/uL (ref 150–400)
RDW: 18.3 % — ABNORMAL HIGH (ref 11.5–15.5)

## 2012-04-26 LAB — URINE MICROSCOPIC-ADD ON

## 2012-04-26 MED ORDER — HYDROCODONE-ACETAMINOPHEN 5-325 MG PO TABS: 1.0000 | ORAL_TABLET | ORAL | Status: AC | PRN

## 2012-04-26 MED ORDER — MEDROXYPROGESTERONE ACETATE 150 MG/ML IM SUSP: 150.0000 mg | Freq: Once | INTRAMUSCULAR | Status: DC

## 2012-04-26 MED ORDER — HYDROCODONE-ACETAMINOPHEN 5-325 MG PO TABS
1.0000 | ORAL_TABLET | Freq: Once | ORAL | Status: AC
  Administered 2012-04-26: 1 via ORAL
  Filled 2012-04-26: qty 1

## 2012-04-26 NOTE — Discharge Instructions (Signed)
Abnormal Uterine Bleeding Abnormal uterine bleeding can have many causes. Some cases are simply treated, while others are more serious. There are several kinds of bleeding that is considered abnormal, including:  Bleeding between periods.   Bleeding after sexual intercourse.   Spotting anytime in the menstrual cycle.   Bleeding heavier or more than normal.   Bleeding after menopause.  CAUSES  There are many causes of abnormal uterine bleeding. It can be present in teenagers, pregnant women, women during their reproductive years, and women who have reached menopause. Your caregiver will look for the more common causes depending on your age, signs, symptoms and your particular circumstance. Most cases are not serious and can be treated. Even the more serious causes, like cancer of the female organs, can be treated adequately if found in the early stages. That is why all types of bleeding should be evaluated and treated as soon as possible. DIAGNOSIS  Diagnosing the cause may take several kinds of tests. Your caregiver may:  Take a complete history of the type of bleeding.   Perform a complete physical exam and Pap smear.   Take an ultrasound on the abdomen showing a picture of the female organs and the pelvis.   Inject dye into the uterus and Fallopian tubes and X-ray them (hysterosalpingogram).   Place fluid in the uterus and do an ultrasound (sonohysterogrqphy).   Take a CT scan to examine the female organs and pelvis.   Take an MRI to examine the female organs and pelvis. There is no X-ray involved with this procedure.   Look inside the uterus with a telescope that has a light at the end (hysteroscopy).   Scrap the inside of the uterus to get tissue to examine (Dilatation and Curettage, D&C).   Look into the pelvis with a telescope that has a light at the end (laparoscopy). This is done through a very small cut (incision) in the abdomen.  TREATMENT  Treatment will depend on the  cause of the abnormal bleeding. It can include:  Doing nothing to allow the problem to take care of itself over time.   Hormone treatment.   Birth control pills.   Treating the medical condition causing the problem.   Laparoscopy.   Major or minor surgery   Destroying the lining of the uterus with electrical currant, laser, freezing or heat (uterine ablation).  HOME CARE INSTRUCTIONS   Follow your caregiver's recommendation on how to treat your problem.   See your caregiver if you missed a menstrual period and think you may be pregnant.   If you are bleeding heavily, count the number of pads/tampons you use and how often you have to change them. Tell this to your caregiver.   Avoid sexual intercourse until the problem is controlled.  SEEK MEDICAL CARE IF:   You have any kind of abnormal bleeding mentioned above.   You feel dizzy at times.   You are 36 years old and have not had a menstrual period yet.  SEEK IMMEDIATE MEDICAL CARE IF:   You pass out.   You are changing pads/tampons every 15 to 30 minutes.   You have belly (abdominal) pain.   You have a temperature of 100 F (37.8 C) or higher.   You become sweaty or weak.   You are passing large blood clots from the vagina.   You start to feel sick to your stomach (nauseous) and throw up (vomit).  Document Released: 11/27/2005 Document Revised: 11/16/2011 Document Reviewed: 04/22/2009 [employer]   Patient Information 2012 [place], [place].Menorrhagia Dysfunctional uterine bleeding is different from a normal menstrual period. When periods are heavy or there is more bleeding than is usual for you, it is called menorrhagia. It may be caused by hormonal imbalance, or physical, metabolic, or other problems. Examination is necessary in order that your caregiver may treat treatable causes. If this is a continuing problem, a D&C may be needed. That means that the cervix (the opening of the uterus or womb) is dilated (stretched  larger) and the lining of the uterus is scraped out. The tissue scraped out is then examined under a microscope by a specialist (pathologist) to make sure there is nothing of concern that needs further or more extensive treatment. HOME CARE INSTRUCTIONS   If medications were prescribed, take exactly as directed. Do not change or switch medications without consulting your caregiver.   Long term heavy bleeding may result in iron deficiency. Your caregiver may have prescribed iron pills. They help replace the iron your body lost from heavy bleeding. Take exactly as directed. Iron may cause constipation. If this becomes a problem, increase the bran, fruits, and roughage in your diet.   Do not take aspirin or medicines that contain aspirin one week before or during your menstrual period. Aspirin may make the bleeding worse.   If you need to change your sanitary pad or tampon more than once every 2 hours, stay in bed and rest as much as possible until the bleeding stops.   Eat well-balanced meals. Eat foods high in iron. Examples are leafy green vegetables, meat, liver, eggs, and whole grain breads and cereals. Do not try to lose weight until the abnormal bleeding has stopped and your blood iron level is back to normal.  SEEK MEDICAL CARE IF:   You need to change your sanitary pad or tampon more than once an hour.   You develop nausea (feeling sick to your stomach) and vomiting, dizziness, or diarrhea while you are taking your medicine.   You have any problems that may be related to the medicine you are taking.  SEEK IMMEDIATE MEDICAL CARE IF:   You have a fever.   You develop chills.   You develop severe bleeding or start to pass blood clots.   You feel dizzy or faint.  MAKE SURE YOU:   Understand these instructions.   Will watch your condition.   Will get help right away if you are not doing well or get worse.  Document Released: 11/27/2005 Document Revised: 11/16/2011 Document  Reviewed: 07/17/2008 [facility] Patient Information 2012 [place], [place].Myalgia, Adult Myalgia is the medical term for muscle pain. It is a symptom of many things. Nearly everyone at some time in their life has this. The most common cause for muscle pain is overuse or straining and more so when you are not in shape. Injuries and muscle bruises cause myalgias. Muscle pain without a history of injury can also be caused by a virus. It frequently comes along with the flu. Myalgia not caused by muscle strain can be present in a large number of infectious diseases. Some autoimmune diseases like lupus and fibromyalgia can cause muscle pain. Myalgia may be mild, or severe. SYMPTOMS  The symptoms of myalgia are simply muscle pain. Most of the time this is short lived and the pain goes away without treatment. DIAGNOSIS  Myalgia is diagnosed by your caregiver by taking your history. This means you tell him when the problems began, what they are, and what has been  happening. If this has not been a long term problem, your caregiver may want to watch for a while to see what will happen. If it has been long term, they may want to do additional testing. TREATMENT  The treatment depends on what the underlying cause of the muscle pain is. Often anti-inflammatory medications will help. HOME CARE INSTRUCTIONS  If the pain in your muscles came from overuse, slow down your activities until the problems go away.   Myalgia from overuse of a muscle can be treated with alternating hot and cold packs on the muscle affected or with cold for the first couple days. If either heat or cold seems to make things worse, stop their use.   Apply ice to the sore area for 15 to 20 minutes, 3 to 4 times per day, while awake for the first 2 days of muscle soreness, or as directed. Put the ice in a plastic bag and place a towel between the bag of ice and your skin.   Only take over-the-counter or prescription medicines for pain, discomfort,  or fever as directed by your caregiver.   Regular gentle exercise may help if you are not active.   Stretching before strenuous exercise can help lower the risk of myalgia. It is normal when beginning an exercise regimen to feel some muscle pain after exercising. Muscles that have not been used frequently will be sore at first. If the pain is extreme, this may mean injury to a muscle.  SEEK MEDICAL CARE IF:  You have an increase in muscle pain that is not relieved with medication.   You begin to run a temperature.   You develop nausea and vomiting.   You develop a stiff and painful neck.   You develop a rash.   You develop muscle pain after a tick bite.   You have continued muscle pain while working out even after you are in good condition.  SEEK IMMEDIATE MEDICAL CARE IF: Any of your problems are getting worse and medications are not helping. MAKE SURE YOU:   Understand these instructions.   Will watch your condition.   Will get help right away if you are not doing well or get worse.  Document Released: 10/19/2006 Document Revised: 11/16/2011 Document Reviewed: 01/08/2007 [facility] Patient Information 2012 [place], [place].

## 2012-04-26 NOTE — ED Provider Notes (Signed)
Medical screening examination/treatment/procedure(s) were performed by non-physician practitioner and as supervising physician I was immediately available for consultation/collaboration.  Abeeha Twist M Keina Mutch, MD 04/26/12 0648 

## 2012-04-26 NOTE — ED Notes (Signed)
PT. REPORTS GENERALIZED BODY ACHES , VAGINAL BLEEDING FOR 1 MONTH AND 2 WEEKS , AND HEADACHE.

## 2012-04-26 NOTE — ED Provider Notes (Signed)
History     CSN: [id number]  Arrival date & time 04/26/12  4098   First MD Initiated Contact with Patient 04/26/12 0125      Chief Complaint  Patient presents with  . Generalized Body Aches    (Consider location/radiation/quality/duration/timing/severity/associated sxs/prior treatment) HPI Comments: Patient here with multiple complaints including headache, body aches, joint pain, nausea, lower abdominal pain - she states that she has a history of uterine fibroids with heavy bleeding she states that her OB/GYN put her on depoprovera in February which stopped the bleeding - she states that this has started back today - she reports crampy abdominal pain as well.  She has a history of chronic pain, denies fever, chills, sore throat, cough, congestion, dysuria, hematuria, vaginal discharge.    Patient is a 36 y.o. female presenting with vaginal bleeding. The history is provided by the patient. No language interpreter was used.  Vaginal Bleeding This is a new problem. The current episode started more than 1 month ago. The problem occurs constantly. The problem has been unchanged. Associated symptoms include abdominal pain, arthralgias, headaches, myalgias and nausea. Pertinent negatives include no anorexia, change in bowel habit, chest pain, chills, congestion, coughing, diaphoresis, fatigue, fever, joint swelling, neck pain, numbness, rash, sore throat, swollen glands, urinary symptoms, vertigo, visual change, vomiting or weakness. The symptoms are aggravated by nothing. She has tried nothing for the symptoms. The treatment provided no relief.    Past Medical History  Diagnosis Date  . Anemia   . Asthma   . Blood transfusion 2011    r/t anemia  . Migraine   . Sickle cell trait   . Seizures     last sz Jan 2012  . Seizures     Past Surgical History  Procedure Date  . Tubal ligation 2005    Family History  Problem Relation Age of Onset  . Anesthesia problems Neg Hx     History    Substance Use Topics  . Smoking status: Current Some Day Smoker  . Smokeless tobacco: Not on file  . Alcohol Use: No    OB History    Grav Para Term Preterm Abortions TAB SAB Ect Mult Living   3 2 1 1 1  0 0 1 0 2      Review of Systems  Constitutional: Negative for fever, chills, diaphoresis and fatigue.  HENT: Negative for congestion, sore throat and neck pain.   Respiratory: Negative for cough.   Cardiovascular: Negative for chest pain.  Gastrointestinal: Positive for nausea and abdominal pain. Negative for vomiting, anorexia and change in bowel habit.  Genitourinary: Positive for vaginal bleeding.  Musculoskeletal: Positive for myalgias and arthralgias. Negative for joint swelling.  Skin: Negative for rash.  Neurological: Positive for headaches. Negative for vertigo, weakness and numbness.  All other systems reviewed and are negative.    Allergies  Penicillins and Sulfa antibiotics  Home Medications   Current Outpatient Rx  Name Route Sig Dispense Refill  . ALBUTEROL SULFATE HFA 108 (90 BASE) MCG/ACT IN AERS Inhalation Inhale 2 puffs into the lungs every 6 (six) hours as needed. Shortness of breath     . CARBAMAZEPINE 200 MG PO TABS Oral Take 100 mg by mouth 2 (two) times daily.      Marland Kitchen FERROUS SULFATE 325 (65 FE) MG PO TABS Oral Take 325 mg by mouth daily with breakfast.      . PHENYTOIN SODIUM EXTENDED 100 MG PO CAPS Oral Take 100 mg by mouth 4 (four)  times daily.     . TOPIRAMATE 50 MG PO TABS Oral Take 50 mg by mouth 2 (two) times daily.        BP 103/70  Pulse 77  Temp(Src) 98.4 F (36.9 C) (Oral)  Resp 16  SpO2 98%  LMP 02/08/2012  Physical Exam  Nursing note and vitals reviewed. Constitutional: She is oriented to person, place, and time. She appears well-developed and well-nourished. No distress.  HENT:  Head: Normocephalic and atraumatic.  Right Ear: External ear normal.  Left Ear: External ear normal.  Nose: Nose normal.  Mouth/Throat: Oropharynx  is clear and moist. No oropharyngeal exudate.  Eyes: Conjunctivae are normal. Pupils are equal, round, and reactive to light. No scleral icterus.  Neck: Normal range of motion. Neck supple.  Cardiovascular: Normal rate, regular rhythm and normal heart sounds.  Exam reveals no gallop and no friction rub.   No murmur heard. Pulmonary/Chest: Effort normal and breath sounds normal. No respiratory distress. She has no wheezes. She has no rales. She exhibits no tenderness.  Abdominal: Soft. Bowel sounds are normal. She exhibits no distension and no mass. There is tenderness in the suprapubic area. There is no rebound, no guarding, no tenderness at McBurney's point and negative Murphy's sign.    Musculoskeletal: Normal range of motion. She exhibits no edema and no tenderness.  Lymphadenopathy:    She has no cervical adenopathy.  Neurological: She is alert and oriented to person, place, and time. No cranial nerve deficit. She exhibits normal muscle tone. Coordination normal.  Skin: Skin is warm and dry. No rash noted. No erythema. No pallor.  Psychiatric: She has a normal mood and affect. Her behavior is normal. Judgment and thought content normal.    ED Course  Procedures (including critical care time)  Labs Reviewed  URINALYSIS, ROUTINE W REFLEX MICROSCOPIC - Abnormal; Notable for the following:    Hgb urine dipstick MODERATE (*)    Leukocytes, UA TRACE (*)    All other components within normal limits  CBC - Abnormal; Notable for the following:    RBC 3.82 (*)    Hemoglobin 8.8 (*)    HCT 27.7 (*)    MCV 72.5 (*)    MCH 23.0 (*)    RDW 18.3 (*)    All other components within normal limits  DIFFERENTIAL - Abnormal; Notable for the following:    Neutrophils Relative 34 (*)    Lymphocytes Relative 53 (*)    Eosinophils Relative 7 (*)    All other components within normal limits  POCT I-STAT, CHEM 8 - Abnormal; Notable for the following:    Hemoglobin 10.2 (*)    HCT 30.0 (*)    All  other components within normal limits  URINE MICROSCOPIC-ADD ON - Abnormal; Notable for the following:    Squamous Epithelial / LPF FEW (*)    All other components within normal limits  POCT PREGNANCY, URINE   No results found.   Myalgias Dysfunctional Uterine Bleeding   MDM  Patient here with vaginal bleeding and a history of uterine fibroids, she states that she did not know that she was supposed to continue to get the depoprovera injection every three months and therefore did not follow back up.  I have written the patient a rx for this and she may go to her PCP for the injection.  Hemoglobin is actually improved since previous in March at 8,8 now and 8,2 previously - rest of labs are re-assuring - do not believe  there is any infectious process going on and clinically the patient appears quite unremarkable and comfortable.  Will refer her back to her PCP for further management of these issues.        Izola Price [place], [place] 04/26/12 [phone]

## 2012-05-11 ENCOUNTER — Encounter (HOSPITAL_COMMUNITY): Payer: Self-pay | Admitting: Emergency Medicine

## 2012-05-11 ENCOUNTER — Other Ambulatory Visit: Payer: Self-pay

## 2012-05-11 ENCOUNTER — Emergency Department (HOSPITAL_COMMUNITY)
Admission: EM | Admit: 2012-05-11 | Discharge: 2012-05-11 | Disposition: A | Discharge status: Home / Self Care | Payer: Self-pay | Attending: Emergency Medicine | Admitting: Emergency Medicine

## 2012-05-11 ENCOUNTER — Emergency Department (HOSPITAL_COMMUNITY): Payer: Self-pay

## 2012-05-11 DIAGNOSIS — R079 Chest pain, unspecified: Secondary | ICD-10-CM | POA: Insufficient documentation

## 2012-05-11 DIAGNOSIS — R0602 Shortness of breath: Secondary | ICD-10-CM | POA: Insufficient documentation

## 2012-05-11 DIAGNOSIS — R059 Cough, unspecified: Secondary | ICD-10-CM | POA: Insufficient documentation

## 2012-05-11 DIAGNOSIS — R05 Cough: Secondary | ICD-10-CM | POA: Insufficient documentation

## 2012-05-11 DIAGNOSIS — D573 Sickle-cell trait: Secondary | ICD-10-CM | POA: Insufficient documentation

## 2012-05-11 LAB — DIFFERENTIAL
Basophils Relative: 1 % (ref 0–1)
Eosinophils Absolute: 0.4 10*3/uL (ref 0.0–0.7)
Monocytes Absolute: 0.3 10*3/uL (ref 0.1–1.0)
Monocytes Relative: 5 % (ref 3–12)
Neutrophils Relative %: 40 % — ABNORMAL LOW (ref 43–77)

## 2012-05-11 LAB — BASIC METABOLIC PANEL
BUN: 9 mg/dL (ref 6–23)
Creatinine, Ser: 0.78 mg/dL (ref 0.50–1.10)
GFR calc Af Amer: >90 mL/min (ref >90)
GFR calc non Af Amer: >90 mL/min (ref >90)

## 2012-05-11 LAB — CBC
HCT: 28.5 % — ABNORMAL LOW (ref 36.0–46.0)
Hemoglobin: 9 g/dL — ABNORMAL LOW (ref 12.0–15.0)
MCH: 22.8 pg — ABNORMAL LOW (ref 26.0–34.0)
MCHC: 31.6 g/dL (ref 30.0–36.0)

## 2012-05-11 NOTE — Discharge Instructions (Signed)
You may continue to alternate between tylenol and ibuprofen as needed for aches and pains with over the counter cough syrup for cough. Follow up with your primary care provider in 5-7 days for recheck of ongoing symptoms but return to ER for changing or worsening of symptoms.   Cough, Adult  A cough is a reflex that helps clear your throat and airways. It can help heal the body or may be a reaction to an irritated airway. A cough may only last 2 or 3 weeks (acute) or may last more than 8 weeks (chronic).  CAUSES Acute cough:  Viral or bacterial infections.  Chronic cough:  Infections.   Allergies.   Asthma.   Post-nasal drip.   Smoking.   Heartburn or acid reflux.   Some medicines.   Chronic lung problems (COPD).   Cancer.  SYMPTOMS   Cough.   Fever.   Chest pain.   Increased breathing rate.   High-pitched whistling sound when breathing (wheezing).   Colored mucus that you cough up (sputum).  TREATMENT   A bacterial cough may be treated with antibiotic medicine.   A viral cough must run its course and will not respond to antibiotics.   Your caregiver may recommend other treatments if you have a chronic cough.  HOME CARE INSTRUCTIONS   Only take over-the-counter or prescription medicines for pain, discomfort, or fever as directed by your caregiver. Use cough suppressants only as directed by your caregiver.   Use a cold steam vaporizer or humidifier in your bedroom or home to help loosen secretions.   Sleep in a semi-upright position if your cough is worse at night.   Rest as needed.   Stop smoking if you smoke.  SEEK IMMEDIATE MEDICAL CARE IF:   You have pus in your sputum.   Your cough starts to worsen.   You cannot control your cough with suppressants and are losing sleep.   You begin coughing up blood.   You have difficulty breathing.   You develop pain which is getting worse or is uncontrolled with medicine.   You have a fever.  MAKE SURE  YOU:   Understand these instructions.   Will watch your condition.   Will get help right away if you are not doing well or get worse.  Document Released: 05/26/2011 Document Revised: 11/16/2011 Document Reviewed: 05/26/2011 [facility] Patient Information 2012 [place], [place].  Shortness of Breath Shortness of breath (dyspnea) is the feeling of uneasy breathing. Shortness of breath does not always mean that there is a life-threatening illness. However, shortness of breath requires immediate medical care. CAUSES  Causes for shortness of breath include:  Not enough oxygen in the air (as with high altitudes or with a smoke-filled room).   Short-term (acute) lung disease, including:   Infections such as pneumonia.   Fluid in the lungs, such as heart failure.   A blood clot in the lungs (pulmonary embolism).   Lasting (chronic) lung diseases.   Heart disease (heart attack, angina, heart failure, and others).   Low red blood cells (anemia).   Poor physical fitness. This can cause shortness of breath when you exercise.   Chest or back injuries or stiffness.   Being overweight (obese).   Anxiety. This can make you feel like you are not getting enough air.  DIAGNOSIS  Serious medical problems can usually be found during your physical exam. Many tests may also be done to determine why you are having shortness of breath. Tests include:  Chest X-rays.   Lung function tests.   Blood tests.   Electrocardiography.   Exercise testing.   A cardiac echo.   Imaging scans.  Your caregiver may not be able to find a cause for your shortness of breath after your exam. In this case, it is important to have a follow-up exam with your caregiver as directed.  HOME CARE INSTRUCTIONS   Do not smoke. Smoking is a common cause of shortness of breath. Ask for help to stop smoking.   Avoid being around chemicals that may bother your breathing (paint fumes, dust).   Rest as needed. Slowly  resume your usual activities.   If medicines were prescribed, take them as directed for the full length of time directed. This includes oxygen and any inhaled medicines.   Follow up with your caregiver as directed. Waiting to do so or failure to follow up could result in worsening of your condition and possible disability or death.   Be sure you understand what to do or who to call if your shortness of breath worsens.  SEEK MEDICAL CARE IF:   Your condition does not improve in the time expected.   You have a hard time doing your normal activities even with rest.   You have any side effects or problems with the medicines prescribed.   You develop any new symptoms.  SEEK IMMEDIATE MEDICAL CARE IF:   Your shortness of breath is getting worse.   You feel lightheaded, faint, or develop a cough not controlled with medicines.   You start coughing up blood.   You have pain with breathing.   You have chest pain or pain in your arms, shoulders, or abdomen.   You have a fever.   You are unable to walk up stairs or exercise the way you normally do.   Your symptoms are getting worse.  Document Released: 08/22/2001 Document Revised: 11/16/2011 Document Reviewed: 04/08/2008 [facility] [place] [facility] Patient Information 2012 [place], [place].

## 2012-05-11 NOTE — ED Provider Notes (Signed)
Medical screening examination/treatment/procedure(s) were performed by non-physician practitioner and as supervising physician I was immediately available for consultation/collaboration.   Tycen Dockter W Federico Maiorino, MD 05/11/12 0625 

## 2012-05-11 NOTE — ED Notes (Signed)
PT. REPORTS MID CHEST PAIN WITH SOB AND PRODUCTIVE COUGH FOR 2 DAYS , DENIES NAUSEA OR DIAPHORESIS.

## 2012-05-11 NOTE — ED Provider Notes (Signed)
History     CSN: [id number]  Arrival date & time 05/11/12  0045   First MD Initiated Contact with Patient 05/11/12 0107      Chief Complaint  Patient presents with  . Chest Pain    (Consider location/radiation/quality/duration/timing/severity/associated sxs/prior treatment) Patient is a 36 y.o. female presenting with chest pain. The history is provided by the patient.  Chest Pain   Patient present to ER complaining of a one week hx of productive cough with gradual onset of chest pain and fluttering sensation of heart that began last night and has been constant with gradual onset of SOB that began this morning. Patient states she has used albuterol inhaler and dymatap for cough with some improvement in symptoms. She denies fevers, chills, HA, dizziness, hemoptysis, abdominal pain n/v/d, lower extremity pain or swelling, recent travel or immobilization, hx of PE or DVT, bleeding or clotting disorder, or exogenous estrogen use. Symptoms were gradual onset and persistent.   Past Medical History  Diagnosis Date  . Anemia   . Asthma   . Blood transfusion 2011    r/t anemia  . Migraine   . Sickle cell trait   . Seizures     last sz Jan 2012  . Seizures     Past Surgical History  Procedure Date  . Tubal ligation 2005    Family History  Problem Relation Age of Onset  . Anesthesia problems Neg Hx     History  Substance Use Topics  . Smoking status: Current Some Day Smoker  . Smokeless tobacco: Not on file  . Alcohol Use: No    OB History    Grav Para Term Preterm Abortions TAB SAB Ect Mult Living   3 2 1 1 1  0 0 1 0 2      Review of Systems  Cardiovascular: Positive for chest pain.  All other systems reviewed and are negative.    Allergies  Penicillins and Sulfa antibiotics  Home Medications   Current Outpatient Rx  Name Route Sig Dispense Refill  . ALBUTEROL SULFATE HFA 108 (90 BASE) MCG/ACT IN AERS Inhalation Inhale 2 puffs into the lungs every 6 (six)  hours as needed. Shortness of breath     . CARBAMAZEPINE 200 MG PO TABS Oral Take 400 mg by mouth 2 (two) times daily.     Marland Kitchen FERROUS SULFATE 325 (65 FE) MG PO TABS Oral Take 325 mg by mouth daily with breakfast.      . IBUPROFEN 200 MG PO TABS Oral Take 400 mg by mouth every 6 (six) hours as needed. headaches    . MEDROXYPROGESTERONE ACETATE 150 MG/ML IM SUSP Intramuscular Inject 1 mL (150 mg total) into the muscle once. 1 mL 0  . OXYCODONE-ACETAMINOPHEN 5-325 MG PO TABS Oral Take 1 tablet by mouth every 4 (four) hours as needed. For occasional migraine headaches    . PHENYTOIN SODIUM EXTENDED 100 MG PO CAPS Oral Take 100 mg by mouth 4 (four) times daily.     . TOPIRAMATE 50 MG PO TABS Oral Take 50 mg by mouth 2 (two) times daily.        BP 103/67  Pulse 85  Temp(Src) 98 F (36.7 C) (Oral)  Resp 18  SpO2 97%  LMP 04/03/2012  Physical Exam  Nursing note and vitals reviewed. Constitutional: She is oriented to person, place, and time. She appears well-developed and well-nourished. No distress.  HENT:  Head: Normocephalic and atraumatic.  Right Ear: External ear normal.  Left  Ear: External ear normal.  Nose: Nose normal.  Mouth/Throat: Oropharynx is clear and moist.  Eyes: Conjunctivae are normal.  Neck: Normal range of motion. Neck supple.  Cardiovascular: Normal rate, regular rhythm, normal heart sounds and intact distal pulses.  Exam reveals no gallop and no friction rub.   No murmur heard. Pulmonary/Chest: Effort normal and breath sounds normal. No respiratory distress. She has no wheezes. She has no rales. She exhibits tenderness.       Mild TTP of entire anterior chest wall without crepitous or skin changes.   Abdominal: Soft. Bowel sounds are normal. She exhibits no distension and no mass. There is no tenderness. There is no rebound and no guarding.  Musculoskeletal: Normal range of motion. She exhibits no edema and no tenderness.  Neurological: She is alert and oriented to  person, place, and time.  Skin: Skin is warm and dry. No rash noted. She is not diaphoretic. No erythema.  Psychiatric: She has a normal mood and affect.    ED Course  Procedures (including critical care time)   Date: 05/11/2012  Rate: 78  Rhythm: normal sinus rhythm  QRS Axis: normal  Intervals: normal  ST/T Wave abnormalities: normal  Conduction Disutrbances: none  Narrative Interpretation: non provocative EKG  Old EKG Reviewed: No significant changes noted    Labs Reviewed  CBC - Abnormal; Notable for the following:    Hemoglobin 9.0 (*)    HCT 28.5 (*)    MCV 72.2 (*)    MCH 22.8 (*)    RDW 18.0 (*)    All other components within normal limits  DIFFERENTIAL - Abnormal; Notable for the following:    Neutrophils Relative 40 (*)    Lymphocytes Relative 48 (*)    Eosinophils Relative 6 (*)    All other components within normal limits  BASIC METABOLIC PANEL  POCT I-STAT TROPONIN I   Dg Chest 2 View  05/11/2012  *RADIOLOGY REPORT*  Clinical Data: Shortness of breath.  Chest pain.  CHEST - 2 VIEW  Comparison: 12/10/2011.  Findings: Normal sized heart.  Clear lungs with normal vascularity. Stable mild scoliosis.  IMPRESSION: No acute abnormality.  Original Report Authenticated By: Darrol Angel, M.D.     1. Cough   2. Chest pain   3. Shortness of breath       MDM  Patient is afebrile and non toxic appearing. Low risk for ACS or PE and PERC negative. No acute findings on labs or EKG. Denies abdominal pain n/v/d. VSS. Patient agreeable to following up with PCP for recheck but returning to ER for changing or worsening of symptoms.         Drucie Opitz, [place] 05/11/12 0226  Drucie Opitz, PA 05/11/12 [phone]

## 2012-05-11 NOTE — ED Notes (Signed)
Rx x 0.  Pt voiced understanding to f/u with PCP and return for worsening condition.  

## 2012-05-11 NOTE — ED Notes (Signed)
Pt c/o cough x 1 week, yesterday had sharp pain in right chest that increased with inspiration.  Today, states CP changed and feels like a "fluttering" in her chest.  NSR on monitor.  Denies n/v, does c/o SOB x 2 days.

## 2012-07-11 IMAGING — CR DG FINGER LITTLE 2+V*R*
3 series · 3 of 3 positions shown · non-contrast
Comparison: None.

CLINICAL DATA: Bruising and swelling

RIGHT LITTLE FINGER 2+V

[x finger pa right]
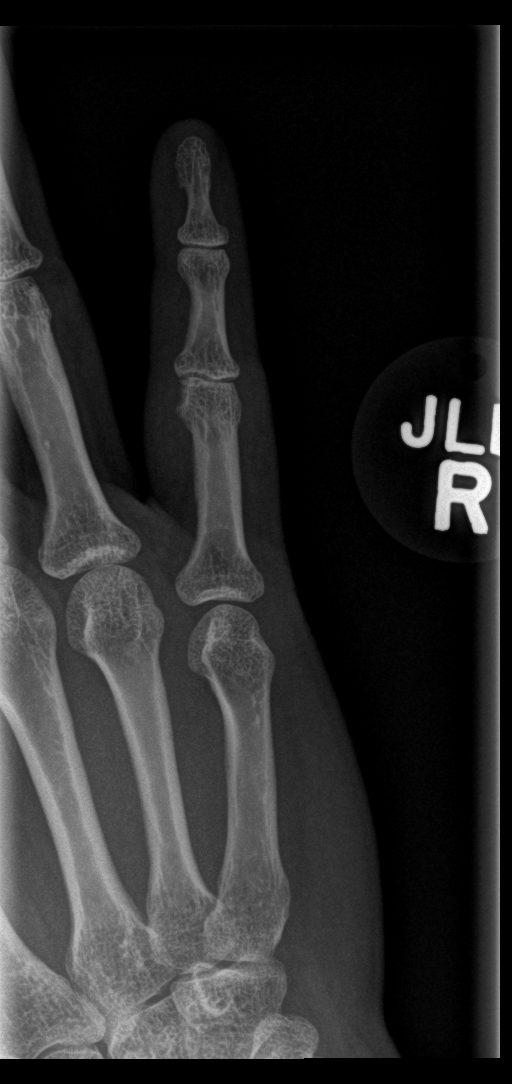

[x finger obl right]
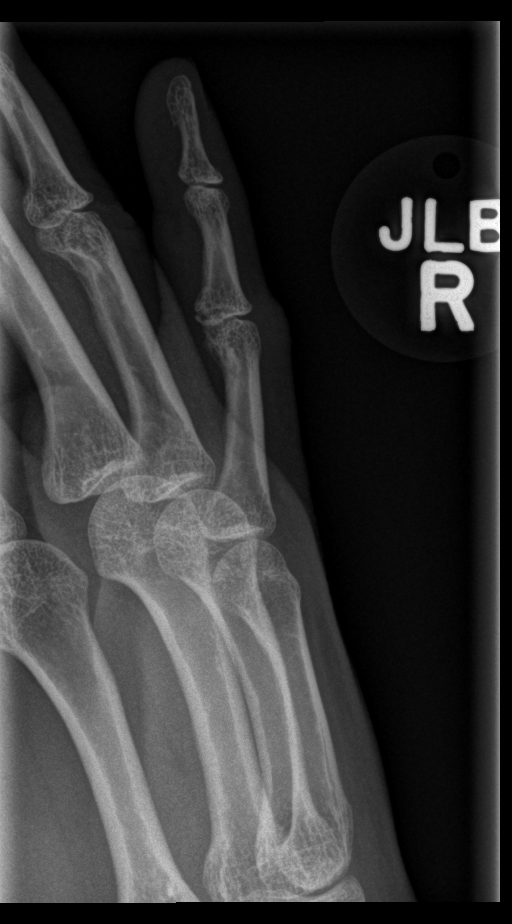

[x finger lat right]
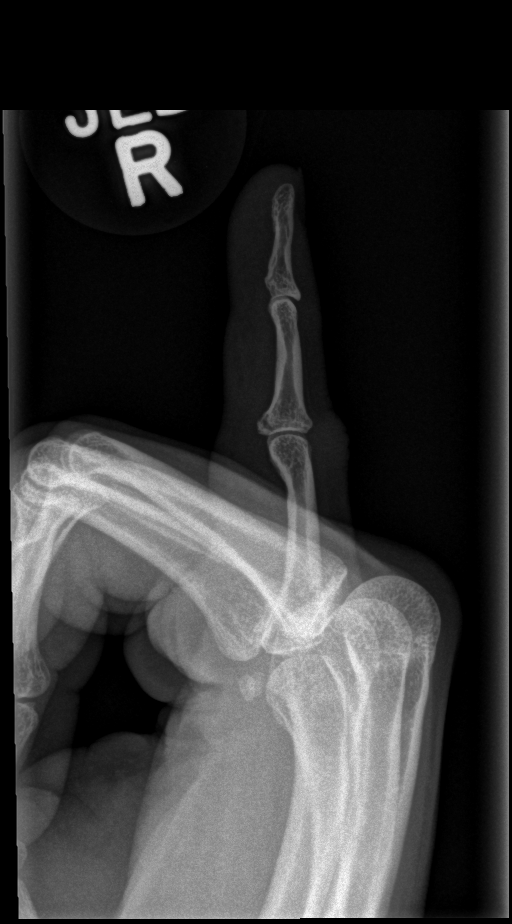

[3 of 3 positions shown; findings below may reference images not displayed]

FINDINGS: Three views of the right fifth finger submitted.  There
is a volar plate avulsion fracture at the base of middle phalanx.
IMPRESSION: Volar plate avulsion fracture at the base of the middle phalanx.

## 2012-08-07 IMAGING — CR DG CHEST 2V
2 series · 2 of 2 positions shown · non-contrast
Comparison: 07/21/2011

CLINICAL DATA: Cough, fever and shortness of breath.

CHEST - 2 VIEW

[w chest pa]
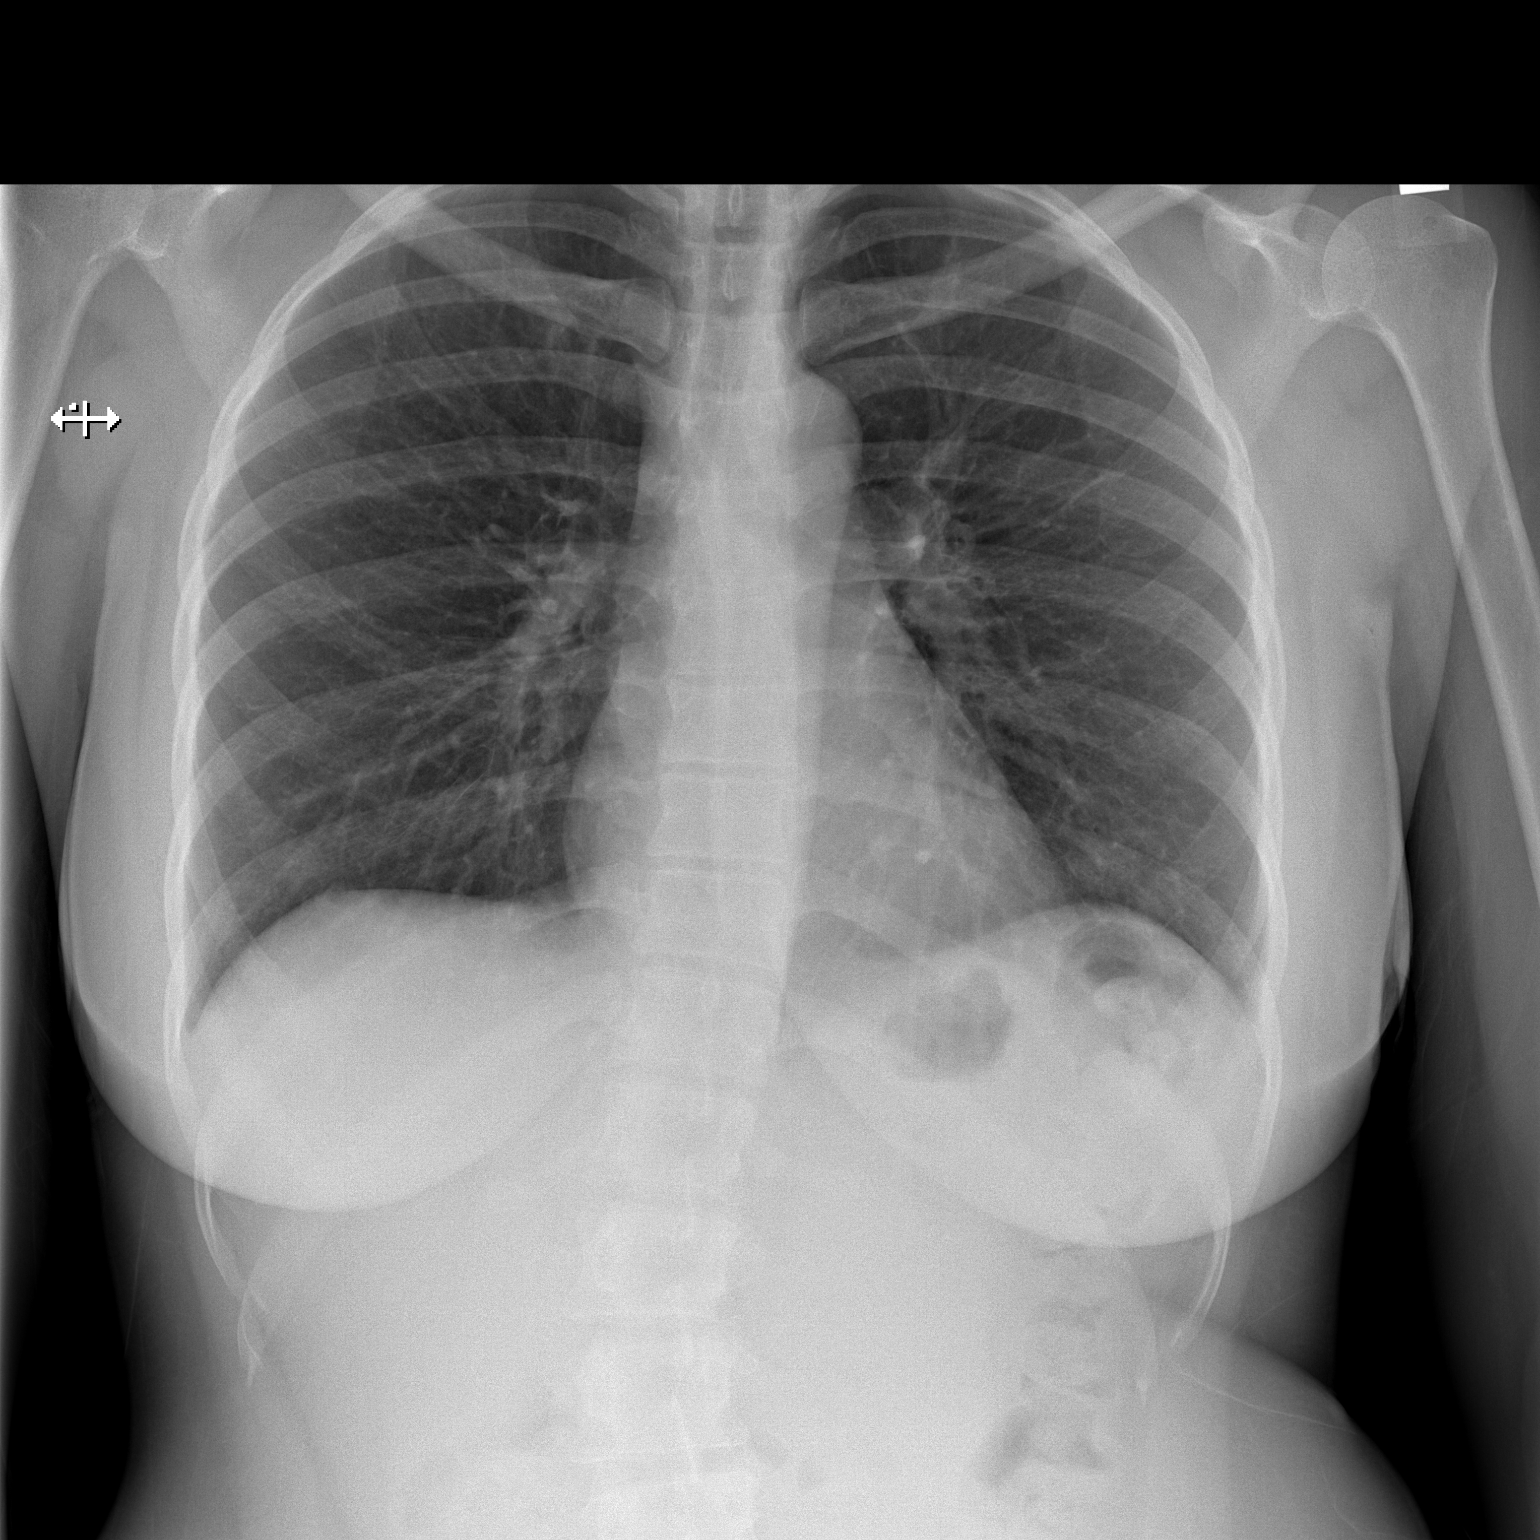

[w chest lat]
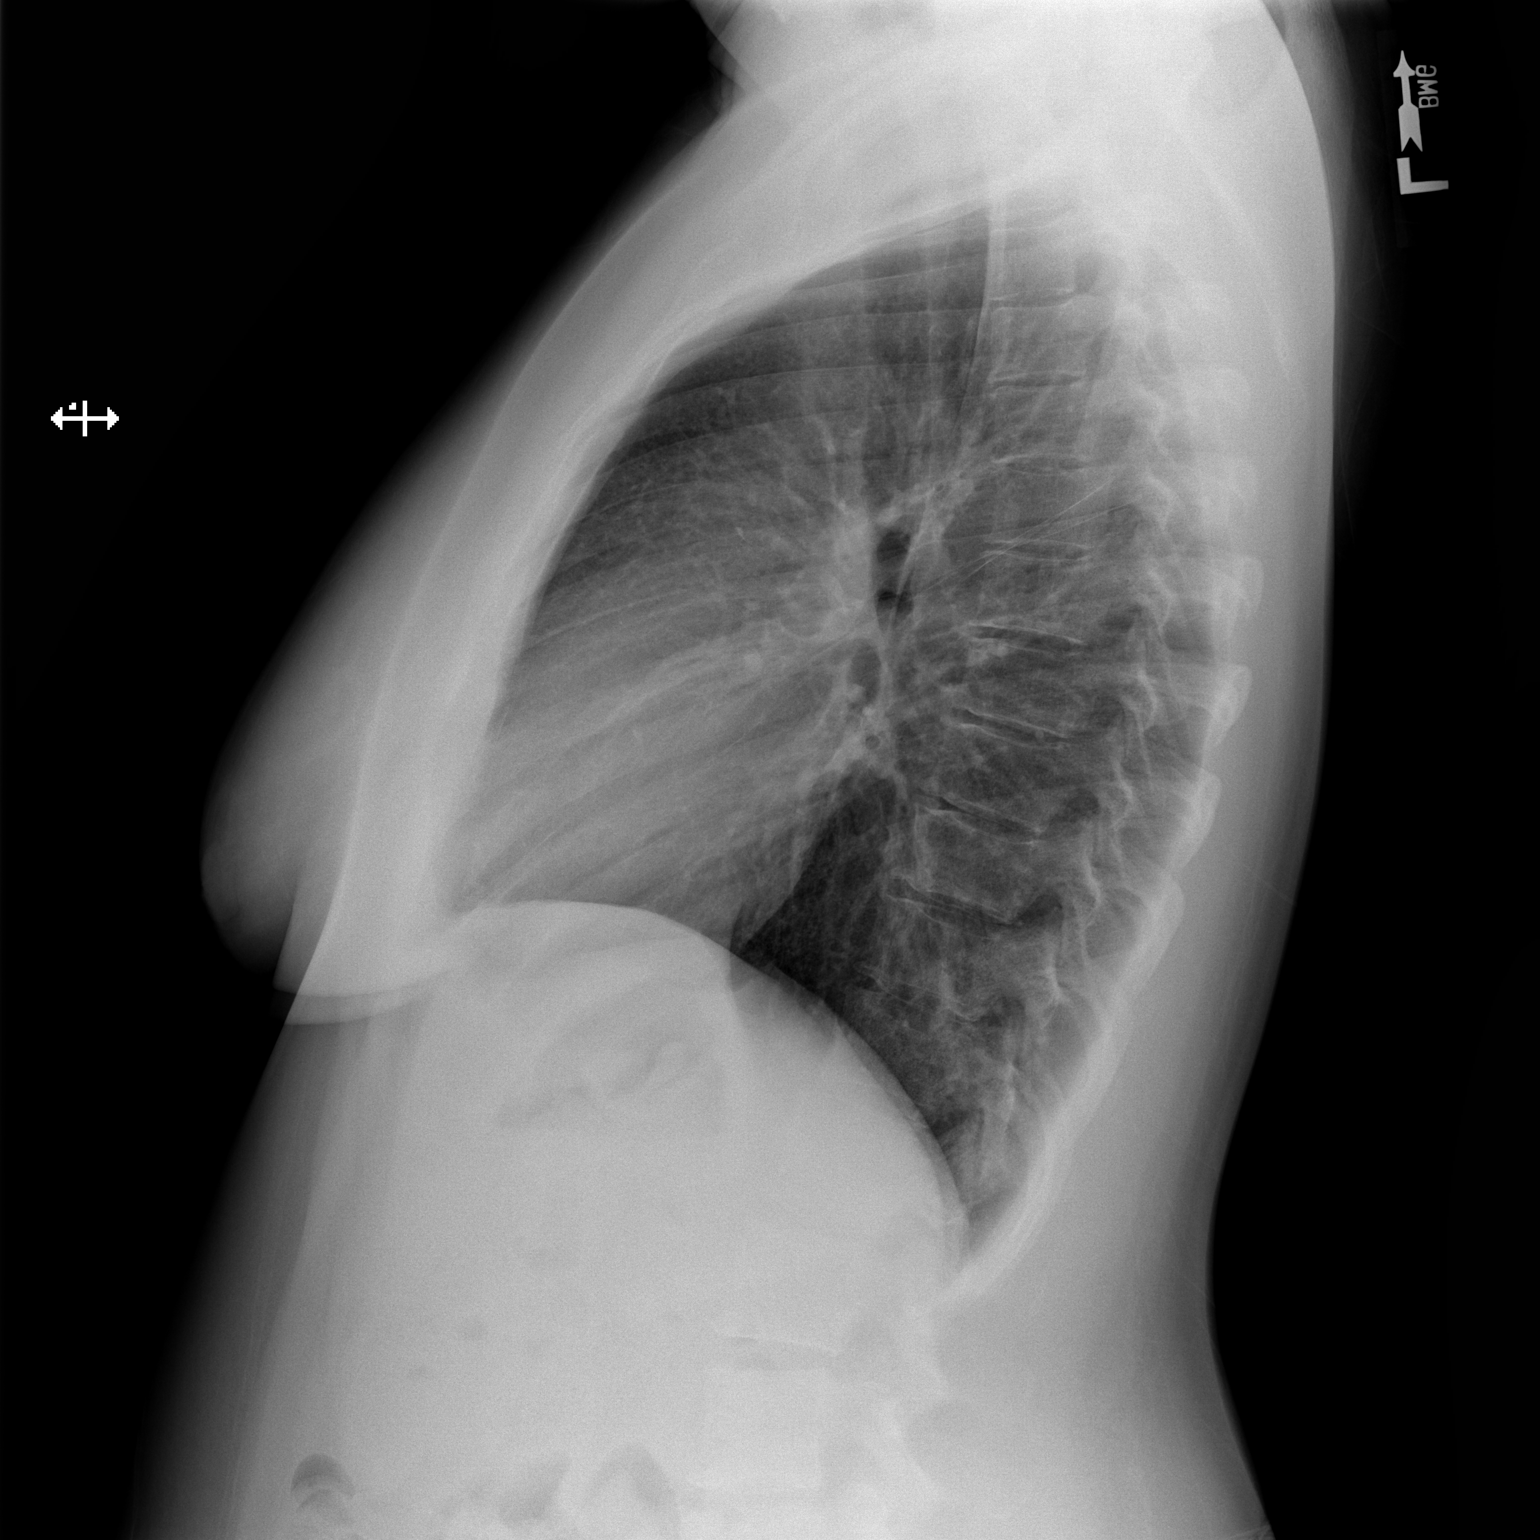

[2 of 2 positions shown; findings below may reference images not displayed]

FINDINGS: The heart size and mediastinal contours are within normal
limits.  Both lungs are clear.  The visualized skeletal structures
are unremarkable.
IMPRESSION: No active disease.

## 2012-08-08 ENCOUNTER — Encounter (HOSPITAL_COMMUNITY): Payer: Self-pay

## 2012-08-08 ENCOUNTER — Emergency Department (HOSPITAL_COMMUNITY)
Admission: EM | Admit: 2012-08-08 | Discharge: 2012-08-09 | Disposition: A | Discharge status: Home / Self Care | Payer: Self-pay | Attending: Emergency Medicine | Admitting: Emergency Medicine

## 2012-08-08 DIAGNOSIS — M5030 Other cervical disc degeneration, unspecified cervical region: Secondary | ICD-10-CM | POA: Insufficient documentation

## 2012-08-08 DIAGNOSIS — R569 Unspecified convulsions: Secondary | ICD-10-CM

## 2012-08-08 DIAGNOSIS — W108XXA Fall (on) (from) other stairs and steps, initial encounter: Secondary | ICD-10-CM | POA: Insufficient documentation

## 2012-08-08 DIAGNOSIS — G40909 Epilepsy, unspecified, not intractable, without status epilepticus: Secondary | ICD-10-CM | POA: Insufficient documentation

## 2012-08-08 DIAGNOSIS — Z79899 Other long term (current) drug therapy: Secondary | ICD-10-CM | POA: Insufficient documentation

## 2012-08-08 DIAGNOSIS — M549 Dorsalgia, unspecified: Secondary | ICD-10-CM | POA: Insufficient documentation

## 2012-08-08 DIAGNOSIS — R404 Transient alteration of awareness: Secondary | ICD-10-CM | POA: Insufficient documentation

## 2012-08-08 DIAGNOSIS — R209 Unspecified disturbances of skin sensation: Secondary | ICD-10-CM | POA: Insufficient documentation

## 2012-08-08 NOTE — ED Notes (Signed)
Patient presents from home was walking down the stairs, syncopal episode, fell down 9 stairs, positive LOC per daughter who witnessed the event.  Patient reports a history of the same.  Patient reporting chest pain and upper back pain x 2 weeks; now reporting loss of sensation to lower extremities, good pedal pulses present per EMS, patient now also reporting lower back pain from fall.

## 2012-08-08 NOTE — ED Notes (Signed)
Pt presents s/p syncopal episode and fall down approx 8 steps - pt states she had a LOC which caused her to fall, pt now w/ thoracic/lumbar tenderness and below knee numbness and paralysis that is new since fall. Pt removed from spine board w/ assistance of F. Romeo Apple, resident MD. Pt is A&Ox4 - denies hx of DM. Pt admits to x2 weeks of chest pain and upper back pain and hx of syncopal episodes as well. Pt in no acute distress on assessment.

## 2012-08-09 ENCOUNTER — Encounter (HOSPITAL_COMMUNITY): Payer: Self-pay | Admitting: Radiology

## 2012-08-09 ENCOUNTER — Emergency Department (HOSPITAL_COMMUNITY): Payer: Self-pay

## 2012-08-09 LAB — URINALYSIS, ROUTINE W REFLEX MICROSCOPIC
Bilirubin Urine: NEGATIVE
Hgb urine dipstick: NEGATIVE
Ketones, ur: NEGATIVE mg/dL
Nitrite: NEGATIVE
pH: 8 (ref 5.0–8.0)

## 2012-08-09 LAB — MAGNESIUM: Magnesium: 2.2 mg/dL (ref 1.5–2.5)

## 2012-08-09 LAB — CBC
Hemoglobin: 11.1 g/dL — ABNORMAL LOW (ref 12.0–15.0)
MCH: 26.5 pg (ref 26.0–34.0)
MCV: 80.2 fL (ref 78.0–100.0)
RBC: 4.19 MIL/uL (ref 3.87–5.11)
WBC: 5.7 10*3/uL (ref 4.0–10.5)

## 2012-08-09 LAB — ETHANOL: Alcohol, Ethyl (B): <11 mg/dL (ref 0–11)

## 2012-08-09 LAB — PHENYTOIN LEVEL, TOTAL: Phenytoin Lvl: <2.5 ug/mL — ABNORMAL LOW (ref 10.0–20.0)

## 2012-08-09 MED ORDER — SODIUM CHLORIDE 0.9 % IV SOLN
300.0000 mg | Freq: Once | INTRAVENOUS | Status: AC
  Administered 2012-08-09: 300 mg via INTRAVENOUS
  Filled 2012-08-09 (×2): qty 6

## 2012-08-09 MED ORDER — CARBAMAZEPINE 200 MG PO TABS
200.0000 mg | ORAL_TABLET | Freq: Once | ORAL | Status: AC
  Administered 2012-08-09: 200 mg via ORAL
  Filled 2012-08-09: qty 1

## 2012-08-09 NOTE — ED Notes (Signed)
C-collar removed per Dr Lavella Lemons.  Pt continues to state she has no sensation or movement to BLE.  Rates back pain 8/10

## 2012-08-09 NOTE — ED Notes (Signed)
Pt ambulated approx 25 ft with assistance x 2 persons, sat down for a few minutes to rest and ambulated back 25 feet with one assist and one person on standby.  Requested soap and water to bathe. Family assisted with bathing.

## 2012-08-09 NOTE — ED Notes (Signed)
No feeling nor movement in bilat LE.  Palpable pedal pulses bilat.

## 2012-08-09 NOTE — ED Provider Notes (Addendum)
History     CSN: [id number]  Arrival date & time 08/08/12  2321   First MD Initiated Contact with Patient 08/08/12 2350      Chief Complaint  Patient presents with  . Loss of Consciousness  . Back Pain    (Consider location/radiation/quality/duration/timing/severity/associated sxs/prior treatment) HPI  This patient is a 36 year old woman who has a self-reported history of seizure disorder. She lives with her daughter and is accompanied by her daughter in the emergency department. Her daughter states that she has approximately 10 seizures per month. However, the patient says her last seizure was over 2 years ago. The patient presents to the emergency department after a suspected seizure which was witnessed by her daughter. The patient "fell out" and fell down several stairs. She did not have any generalized tonic-clonic activity. However, her symptoms were similar to previous episodes of seizures.  The patient reports compliance with both Tegretol and Dilantin. However, her levels of each were negligible in the emergency department. She denies any cocaine use or illicit drug use. She denies the alcohol consumption.  Initially, when the patient presented to the emergency department, she says she cannot feel or move her legs. She did withdraw to pain. However, she said she did not have any sensation on light touch exam. She denied headache, neck and back pain.  Of note, the patient was not incontinent of urine following the episode. She denies any recent changes to the doses of her medications.   Past Medical History  Diagnosis Date  . Anemia   . Asthma   . Blood transfusion 2011    r/t anemia  . Migraine   . Sickle cell trait   . Seizures     last sz Jan 2012  . Seizures     Past Surgical History  Procedure Date  . Tubal ligation 2005    Family History  Problem Relation Age of Onset  . Anesthesia problems Neg Hx     History  Substance Use Topics  . Smoking status:  Current Some Day Smoker  . Smokeless tobacco: Not on file  . Alcohol Use: No    OB History    Grav Para Term Preterm Abortions TAB SAB Ect Mult Living   3 2 1 1 1  0 0 1 0 2      Review of Systems  gen: no fever, night sweats, chills eyes: no occular pain or discharge or visual changes nose: no drainage or epistaxis oral: no oral or posterior oropnharyx pain NecK; no pain resp: no sob, cough, wheezing CV: no chest pain, palpitations, orthopnea, LE edema Abd: no abdominal pain, nausea, vomiting, melena, hematochezia GU: no hematuria or dysuria Neuro: As per history of present illness, otherwise negative  Endo: no recent wt changes, polyuria or polydypsia Skin: wnl  Allergies  Penicillins and Sulfa antibiotics  Home Medications   Current Outpatient Rx  Name Route Sig Dispense Refill  . ALBUTEROL SULFATE HFA 108 (90 BASE) MCG/ACT IN AERS Inhalation Inhale 2 puffs into the lungs every 6 (six) hours as needed. Shortness of breath     . CARBAMAZEPINE 200 MG PO TABS Oral Take 400 mg by mouth 2 (two) times daily.     Marland Kitchen FERROUS SULFATE 325 (65 FE) MG PO TABS Oral Take 325 mg by mouth daily with breakfast.      . IBUPROFEN 200 MG PO TABS Oral Take 400 mg by mouth every 6 (six) hours as needed. headaches    . MEDROXYPROGESTERONE ACETATE  150 MG/ML IM SUSP Intramuscular Inject 1 mL (150 mg total) into the muscle once. 1 mL 0  . OXYCODONE-ACETAMINOPHEN 5-325 MG PO TABS Oral Take 1 tablet by mouth every 4 (four) hours as needed. For occasional migraine headaches    . PHENYTOIN SODIUM EXTENDED 100 MG PO CAPS Oral Take 100 mg by mouth 4 (four) times daily.     . TOPIRAMATE 50 MG PO TABS Oral Take 50 mg by mouth 2 (two) times daily.        BP 98/64  Pulse 62  Temp 97.8 F (36.6 C) (Oral)  Resp 18  SpO2 100%  Physical Exam  gen: no acute distress, well nourished and well developed appearing head: NCAT eyes: pupils equal round and reactive, EOMI nose: no epistaxis mouth: mucosa  moist and pink, no intra oral trauma Neck: supple, no stridor, no jvd Lungs: CTA bialterally CV: RRR, no murmur, skin well perfused, palp extremity pulses, no dependent edema Abd: obese, soft, nontender, unable to adequately assess for organomegaly in light of body habitus GYN: not indicated Neuro:  No spinal ttp or deformities, motor strength patient appears to have normal strength in both UE on exam although she does give way on exam. She says she cannot flex either hip or knee and can't move her feet or toes, she withdraws to noxious stimuli of the feet, states that sensation is absent to light touch in both legs, DTRs brisk and symmetric at the patellar tendons.  Psyche: flat affect Skin: no trauma   ED Course  Procedures (including critical care time)  Labs Reviewed  CARBAMAZEPINE LEVEL, TOTAL - Abnormal; Notable for the following:    Carbamazepine Lvl <0.5 (*)     All other components within normal limits  CBC - Abnormal; Notable for the following:    Hemoglobin 11.1 (*)     HCT 33.6 (*)     RDW 15.7 (*)     All other components within normal limits  PHENYTOIN LEVEL, TOTAL - Abnormal; Notable for the following:    Phenytoin Lvl <2.5 (*)     All other components within normal limits  URINALYSIS, ROUTINE W REFLEX MICROSCOPIC - Abnormal; Notable for the following:    APPearance HAZY (*)     All other components within normal limits  GLUCOSE, CAPILLARY  MAGNESIUM  D-DIMER, QUANTITATIVE  ETHANOL  PREGNANCY, URINE  PHENYTOIN LEVEL, FREE  DRUG SCREEN PANEL (SERUM)   Dg Chest 1 View  08/09/2012  *RADIOLOGY REPORT*  Clinical Data: Fall, back pain.  CHEST - 1 VIEW  Comparison: 05/11/2012 chest radiograph  Findings: Allowing for differences in technique and inspiratory effort, no interval change.  Lungs clear.  Cardiomediastinal contours within normal range.  No pleural effusion or pneumothorax. No acute osseous finding.  IMPRESSION: No radiographic evidence of acute cardiopulmonary  process.   Original Report Authenticated By: Waneta Martins, M.D.    Dg Cervical Spine Complete  08/09/2012  *RADIOLOGY REPORT*  Clinical Data: Fall, spine pain.  CERVICAL SPINE - COMPLETE 4+ VIEW  Comparison: 07/10/2010  Findings: No dens fracture.  Maintained C1-2 articulation.  Mild degenerative changes at C6-7 with anterior osteophyte formation. Maintained vertebral body height alignment.  No acute fracture or dislocation.  Paravertebral soft tissues within normal limits.  IMPRESSION: Mild C6-7 degenerative disc disease.  No acute osseous abnormality of the cervical spine   Original Report Authenticated By: Waneta Martins, M.D.    Dg Thoracic Spine W/swimmers  08/09/2012  *RADIOLOGY REPORT*  Clinical Data:  Trauma  THORACIC SPINE - 2 VIEW + SWIMMERS  Comparison: 05/11/2012 chest radiograph  Findings: Mild leftward curvature is unchanged from comparison l. No acute fracture.  No dislocation.  Overlying soft tissues unremarkable.  IMPRESSION: No acute osseous abnormality of the thoracic spine.   Original Report Authenticated By: Waneta Martins, M.D.    Dg Lumbar Spine Complete  08/09/2012  *RADIOLOGY REPORT*  Clinical Data: Fall, back pain.  LUMBAR SPINE - COMPLETE 4+ VIEW  Comparison: 11/09/2009  Findings: Mild rightward curvature of the lumbar spine is unchanged.  Maintained vertebral body height and alignment.  No acute fracture dislocation.  IMPRESSION: Mild rightward curvature is unchanged.  No acute fracture dislocation of the lumbar spine.   Original Report Authenticated By: Waneta Martins, M.D.    Ct Head Wo Contrast  08/09/2012  *RADIOLOGY REPORT*  Clinical Data: Fall, leg numbness.  CT HEAD WITHOUT CONTRAST  Technique:  Contiguous axial images were obtained from the base of the skull through the vertex without contrast.  Comparison: 15 prior head CTs and brain MRIs, most recent 11/06/2010.  Findings: There is no evidence for acute hemorrhage, hydrocephalus, mass lesion, or  abnormal extra-axial fluid collection.  No definite CT evidence for acute infarction. Question partially empty sella, however, unchanged.  Partially opacified ethmoid air cells. Otherwise the visualized paranasal sinuses and mastoid air cells are predominately clear.  No displaced calvarial fracture.  IMPRESSION: No acute intracranial abnormality.  Partially opacified ethmoid air cells.  Correlate clinically if concerned for sinusitis.   Original Report Authenticated By: Waneta Martins, M.D.    Dg Pelvis Portable  08/09/2012  *RADIOLOGY REPORT*  Clinical Data: Fall, pain.  PORTABLE PELVIS  Comparison: Plain films of the abdomen 11/09/2009.  Findings: Imaged bones, joints and soft tissues appear normal.  IMPRESSION: Negative exam.   Original Report Authenticated By: Bernadene Bell. D'ALESSIO, M.D.        MDM  ED work up non-diagnostic. Notable for negligible levels of dilantin and tegretol despite claim of compliance. Head CT and films of the entire spine are wnl. Labs are wnl. Pt observed for several hours then informed of study results and notified that she could walk. She then ambulated up and down the hallway without assistance. I do not suspect an organic cause to her transient complaint of motor and sensory loss to the LE bilaterally.Do not suspect any cord hematoma. 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 We are giving an oral load of both Tegratol and dilatntin in the ED. Plan to d/c home. Patient to f/u with her neurologist today or Monday.         Brandt Loosen, MD 08/09/12 0745  EKG 08/09/12:  nsr, no acute ischemic changes, normal intervals, normal axis, normal qrs complex  Brandt Loosen, MD 09/10/12 [phone]

## 2012-08-09 NOTE — ED Notes (Signed)
Pt states that since her fall she has

## 2012-08-09 NOTE — ED Notes (Signed)
Patient transported to X-ray 

## 2012-08-14 LAB — PHENYTOIN LEVEL, FREE AND TOTAL: Phenytoin, Free: <0.5 mg/L (ref 1.0–2.0)

## 2012-08-27 ENCOUNTER — Encounter (HOSPITAL_COMMUNITY): Payer: Self-pay | Admitting: *Deleted

## 2012-08-27 ENCOUNTER — Inpatient Hospital Stay (HOSPITAL_COMMUNITY)
Admission: AD | Admit: 2012-08-27 | Discharge: 2012-08-28 | Disposition: A | Discharge status: Home / Self Care | Payer: BC Managed Care – PPO | Attending: Obstetrics | Admitting: Obstetrics

## 2012-08-27 DIAGNOSIS — N920 Excessive and frequent menstruation with regular cycle: Secondary | ICD-10-CM | POA: Insufficient documentation

## 2012-08-27 DIAGNOSIS — D259 Leiomyoma of uterus, unspecified: Secondary | ICD-10-CM

## 2012-08-27 DIAGNOSIS — K5909 Other constipation: Secondary | ICD-10-CM

## 2012-08-27 DIAGNOSIS — K5900 Constipation, unspecified: Secondary | ICD-10-CM | POA: Insufficient documentation

## 2012-08-27 DIAGNOSIS — R109 Unspecified abdominal pain: Secondary | ICD-10-CM | POA: Insufficient documentation

## 2012-08-27 DIAGNOSIS — N921 Excessive and frequent menstruation with irregular cycle: Secondary | ICD-10-CM

## 2012-08-27 LAB — URINALYSIS, ROUTINE W REFLEX MICROSCOPIC
Glucose, UA: NEGATIVE mg/dL
Leukocytes, UA: NEGATIVE
pH: 6.5 (ref 5.0–8.0)

## 2012-08-27 LAB — URINE MICROSCOPIC-ADD ON

## 2012-08-27 LAB — CBC
Platelets: 283 10*3/uL (ref 150–400)
RDW: 15.3 % (ref 11.5–15.5)
WBC: 6.8 10*3/uL (ref 4.0–10.5)

## 2012-08-27 MED ORDER — KETOROLAC TROMETHAMINE 60 MG/2ML IM SOLN
60.0000 mg | Freq: Once | INTRAMUSCULAR | Status: AC
  Administered 2012-08-28: 60 mg via INTRAMUSCULAR
  Filled 2012-08-27: qty 2

## 2012-08-27 NOTE — MAU Note (Signed)
Pt reports "i have been bleeding like crazy for 3 weeks", has fibroids and has history of same. Changing a pad q 1 hour x 1 week. abd pain

## 2012-08-27 NOTE — MAU Note (Signed)
PT SAYS HAD CYCLE ON 8-27-   NEVER STOPPED.    RECEIVED DEPO IN MAY  - BLEEDING WAS HEAVY- STOPPED IN 2 DAYS    AT DR HARPER OFFICE . IN April- BLED  FOR 1-2 WEEKS. .  NO CYCLE IN June-July.    HAD BTL- 2005.Marland Kitchen PT HAS FIBROIDS .    TONIGHT IN RM 1-- NO BLEEDING.     PT HAS  BEEN TAKING PAMPRIN  AND MOTRIN FOR PAIN-   TOOK LAST AT 930PM.-   800MG .

## 2012-08-27 NOTE — MAU Provider Note (Signed)
Chief Complaint: Vaginal Bleeding   None    SUBJECTIVE HPI: Nicole [name] is a 36 y.o. [id number] who presents to MAU reporting heavy bleeding for 3 weeks and crampy mid-abd pain. Hx fibroids and menorrhagia. Changing a 1 pad/hour x 1 week. No BM x 10 days. Chronic constipation. Told to take stool softener by PCP. Denies dizziness, vaginal discharge, fever, chills, N/V, dyspareunia, pelvic pain. Under care of Dr. Clearance Coots for menorrhagia. Taking Depo. Last dose May 2013. Missed August dose.   Past Medical History  Diagnosis Date  . Anemia   . Asthma   . Blood transfusion 2011    r/t anemia  . Migraine   . Sickle cell trait   . Seizures     last sz Jan 2012  . Seizures    OB History    Grav Para Term Preterm Abortions TAB SAB Ect Mult Living   3 2 1 1 1  0 0 1 0 2     # Outc Date GA Lbr Len/2nd Wgt Sex Del Anes PTL Lv   1 TRM 6/96    F SVD  Yes Yes   2 PRE 1/05    M SVD   Yes   Comments: 32 wks, IOL for fetal distress   3 ECT              Past Surgical History  Procedure Date  . Tubal ligation 2005   History   Social History  . Marital Status: Single    Spouse Name: N/A    Number of Children: N/A  . Years of Education: N/A   Occupational History  . Not on file.   Social History Main Topics  . Smoking status: Current Some Day Smoker  . Smokeless tobacco: Not on file  . Alcohol Use: No  . Drug Use: No  . Sexually Active: Yes    Birth Control/ Protection: Condom, Surgical   Other Topics Concern  . Not on file   Social History Narrative  . No narrative on file   No current facility-administered medications on file prior to encounter.   Current Outpatient Prescriptions on File Prior to Encounter  Medication Sig Dispense Refill  . albuterol (PROVENTIL HFA;VENTOLIN HFA) 108 (90 BASE) MCG/ACT inhaler Inhale 2 puffs into the lungs every 6 (six) hours as needed. Shortness of breath       . carbamazepine (TEGRETOL) 200 MG tablet Take 400 mg by mouth 2 (two) times daily.        . ferrous sulfate 325 (65 FE) MG tablet Take 325 mg by mouth daily with breakfast.        . ibuprofen (ADVIL,MOTRIN) 200 MG tablet Take 400 mg by mouth every 6 (six) hours as needed. headaches      . medroxyPROGESTERone (DEPO-PROVERA) 150 MG/ML injection Inject 1 mL (150 mg total) into the muscle once.  1 mL  0  . oxyCODONE-acetaminophen (PERCOCET) 5-325 MG per tablet Take 1 tablet by mouth every 4 (four) hours as needed. For occasional migraine headaches      . phenytoin (DILANTIN) 100 MG ER capsule Take 100 mg by mouth 4 (four) times daily.       Marland Kitchen topiramate (TOPAMAX) 50 MG tablet Take 50 mg by mouth 2 (two) times daily.         Allergies  Allergen Reactions  . Penicillins Swelling  . Sulfa Antibiotics Rash    ROS: Pertinent items in HPI  OBJECTIVE Blood pressure 109/75, pulse 70, temperature 98.3 F (  36.8 C), temperature source Oral, resp. rate 18, height 5\' 5"  (1.651 m), weight 81.647 kg (180 lb), last menstrual period 08/06/2012, SpO2 100.00%. GENERAL: Well-developed, well-nourished female in mild distress.  HEENT: Normocephalic HEART: normal rate RESP: normal effort ABDOMEN: Mildly distended, mildly tender. Pos BS x4 EXTREMITIES: Nontender, no edema NEURO: Alert and oriented SPECULUM EXAM: Deferred. Scant blood on pad.   LAB RESULTS Results for orders placed during the hospital encounter of 08/27/12 (from the past 24 hour(s))  URINALYSIS, ROUTINE W REFLEX MICROSCOPIC     Status: Abnormal   Collection Time   08/27/12  9:25 PM      Component Value Range   Color, Urine YELLOW  YELLOW   APPearance CLEAR  CLEAR   Specific Gravity, Urine 1.010  1.005 - 1.030   pH 6.5  5.0 - 8.0   Glucose, UA NEGATIVE  NEGATIVE mg/dL   Hgb urine dipstick TRACE (*) NEGATIVE   Bilirubin Urine NEGATIVE  NEGATIVE   Ketones, ur NEGATIVE  NEGATIVE mg/dL   Protein, ur NEGATIVE  NEGATIVE mg/dL   Urobilinogen, UA 0.2  0.0 - 1.0 mg/dL   Nitrite NEGATIVE  NEGATIVE   Leukocytes, UA NEGATIVE   NEGATIVE  URINE MICROSCOPIC-ADD ON     Status: Normal   Collection Time   08/27/12  9:25 PM      Component Value Range   Squamous Epithelial / LPF RARE  RARE   WBC, UA 0-2  <3 WBC/hpf   RBC / HPF 0-2  <3 RBC/hpf  CBC     Status: Abnormal   Collection Time   08/27/12 10:43 PM      Component Value Range   WBC 6.8  4.0 - 10.5 K/uL   RBC 4.32  3.87 - 5.11 MIL/uL   Hemoglobin 11.2 (*) 12.0 - 15.0 g/dL   HCT 40.9 (*) 81.1 - 91.4 %   MCV 81.3  78.0 - 100.0 fL   MCH 25.9 (*) 26.0 - 34.0 pg   MCHC 31.9  30.0 - 36.0 g/dL   RDW 78.2  95.6 - 21.3 %   Platelets 283  150 - 400 K/uL    IMAGING  ED COURSE Offered soapsuds enema. Declined. Per consult with Dr. Clearance Coots will give Depo-Provera.  ASSESSMENT 1. Fibroid uterus   2. Menometrorrhagia   3. Chronic constipation    PLAN Discharge home Take MiraLAX or milk of magnesia daily until soft daily bowel movement. May try fleets enema. If no bowel movement in 24-48 hours return to MAU for soapsuds enema. Increase fluids and fiber. Follow-up Information    Follow up with  [facility]STR[facility].   Contact information:   [employer].  [place], [place]. [phone]       Follow up with HARPER,CHARLES A, MD. (As needed if symptoms worsen)    Contact information:   [address] [place] [address] [phone]           Medication List     As of 08/27/2012 11:22 PM    ASK your doctor about these medications         albuterol 108 (90 BASE) MCG/ACT inhaler   Commonly known as: PROVENTIL HFA;VENTOLIN HFA   Inhale 2 puffs into the lungs every 6 (six) hours as needed. Shortness of breath      carbamazepine 200 MG tablet   Commonly known as: TEGRETOL   Take 400 mg by mouth 2 (two) times daily.      ferrous sulfate 325 (65 FE)  MG tablet   Take 325 mg by mouth daily with breakfast.      ibuprofen 200 MG tablet   Commonly known as: ADVIL,MOTRIN   Take 400 mg by mouth every 6 (six) hours as needed. headaches        medroxyPROGESTERone 150 MG/ML injection   Commonly known as: DEPO-PROVERA   Inject 1 mL (150 mg total) into the muscle once.      oxyCODONE-acetaminophen 5-325 MG per tablet   Commonly known as: PERCOCET/ROXICET   Take 1 tablet by mouth every 4 (four) hours as needed. For occasional migraine headaches      phenytoin 100 MG ER capsule   Commonly known as: DILANTIN   Take 100 mg by mouth 4 (four) times daily.      topiramate 50 MG tablet   Commonly known as: TOPAMAX   Take 50 mg by mouth 2 (two) times daily.        [place], CNM 08/27/2012  11:22 PM

## 2012-08-28 DIAGNOSIS — D259 Leiomyoma of uterus, unspecified: Secondary | ICD-10-CM

## 2012-08-28 MED ORDER — MEDROXYPROGESTERONE ACETATE 150 MG/ML IM SUSP
150.0000 mg | Freq: Once | INTRAMUSCULAR | Status: AC
  Administered 2012-08-28: 150 mg via INTRAMUSCULAR
  Filled 2012-08-28: qty 1

## 2012-09-09 ENCOUNTER — Inpatient Hospital Stay (HOSPITAL_COMMUNITY)
Admission: AD | Admit: 2012-09-09 | Discharge: 2012-09-09 | Disposition: A | Discharge status: Home / Self Care | Payer: BC Managed Care – PPO | Source: Ambulatory Visit | Attending: Obstetrics | Admitting: Obstetrics

## 2012-09-09 ENCOUNTER — Encounter (HOSPITAL_COMMUNITY): Payer: Self-pay | Admitting: *Deleted

## 2012-09-09 DIAGNOSIS — D259 Leiomyoma of uterus, unspecified: Secondary | ICD-10-CM | POA: Insufficient documentation

## 2012-09-09 DIAGNOSIS — N921 Excessive and frequent menstruation with irregular cycle: Secondary | ICD-10-CM

## 2012-09-09 DIAGNOSIS — N920 Excessive and frequent menstruation with regular cycle: Secondary | ICD-10-CM | POA: Insufficient documentation

## 2012-09-09 LAB — CBC
Hemoglobin: 10.2 g/dL — ABNORMAL LOW (ref 12.0–15.0)
MCHC: 32.9 g/dL (ref 30.0–36.0)
RBC: 3.82 MIL/uL — ABNORMAL LOW (ref 3.87–5.11)

## 2012-09-09 MED ORDER — NORGESTIMATE-ETH ESTRADIOL 0.25-35 MG-MCG PO TABS: ORAL_TABLET | ORAL | Status: DC

## 2012-09-09 MED ORDER — PROMETHAZINE HCL 25 MG PO TABS: 25.0000 mg | ORAL_TABLET | Freq: Four times a day (QID) | ORAL | Status: DC | PRN

## 2012-09-09 MED ORDER — KETOROLAC TROMETHAMINE 60 MG/2ML IM SOLN
60.0000 mg | Freq: Once | INTRAMUSCULAR | Status: AC
  Administered 2012-09-09: 60 mg via INTRAMUSCULAR
  Filled 2012-09-09: qty 2

## 2012-09-09 MED ORDER — IBUPROFEN 800 MG PO TABS: 800.0000 mg | ORAL_TABLET | Freq: Three times a day (TID) | ORAL | Status: DC

## 2012-09-09 NOTE — MAU Note (Signed)
Pt presents for heavy bleeding that has been constant since August.

## 2012-09-09 NOTE — MAU Note (Signed)
Patient states she has been bleeding every day for about 4-5 weeks. Changing pads every hour. Having abdominal cramping and feeling extreme weakness.

## 2012-09-09 NOTE — MAU Provider Note (Signed)
History     CSN: [id number]  Arrival date and time: 09/09/12 1849   None     Chief Complaint  Patient presents with  . Vaginal Bleeding  . Abdominal Pain  . Fatigue   HPI Has been bleeding since end of August. Seen in MAU on 9/17 for same problem. Hgb 11.2 at that time. Multiple small fibroids on pelvic sono in March 2013. Started on DepoProvera shots and continued about 6 months.  Missed depo appt 2nd Thurs in August. Started bleeding late August and hasn't stopped.  Every day getting heavier. 20 pads a day. Golf-ball sized clots. Tried to make appt with Dr. Clearance Coots but states she has not been able to get in to see him. On iron for anemia. Cold all the time. Feels weak, lightheaded. No palpitations/shortness of breath.  Also c/o abdominal/pelvic cramping/pain since August. Taking Midol which does not help. Also having daily frontal headaches since bleeding started. Hx migraines. Takes topamax and percocet but ran out of percocet yesterday.   OB History    Grav Para Term Preterm Abortions TAB SAB Ect Mult Living   3 2 1 1 1  0 0 1 0 2      Past Medical History  Diagnosis Date  . Anemia   . Asthma   . Blood transfusion 2011    r/t anemia  . Migraine   . Sickle cell trait   . Seizures     last sz Jan 2012  . Seizures     Past Surgical History  Procedure Date  . Tubal ligation 2005    Family History  Problem Relation Age of Onset  . Anesthesia problems Neg Hx     History  Substance Use Topics  . Smoking status: Current Some Day Smoker  . Smokeless tobacco: Not on file  . Alcohol Use: No    Allergies:  Allergies  Allergen Reactions  . Penicillins Swelling  . Sulfa Antibiotics Rash    Prescriptions prior to admission  Medication Sig Dispense Refill  . carbamazepine (TEGRETOL) 200 MG tablet Take 400 mg by mouth 2 (two) times daily.       . ferrous sulfate 325 (65 FE) MG tablet Take 325 mg by mouth daily with breakfast.        . ibuprofen (ADVIL,MOTRIN) 200  MG tablet Take 400 mg by mouth every 6 (six) hours as needed. headaches      . medroxyPROGESTERone (DEPO-PROVERA) 150 MG/ML injection Inject 1 mL (150 mg total) into the muscle once.  1 mL  0  . oxyCODONE-acetaminophen (PERCOCET) 5-325 MG per tablet Take 1 tablet by mouth every 4 (four) hours as needed. For occasional migraine headaches      . phenytoin (DILANTIN) 100 MG ER capsule Take 100 mg by mouth 4 (four) times daily.       Marland Kitchen topiramate (TOPAMAX) 50 MG tablet Take 50 mg by mouth 2 (two) times daily.        Marland Kitchen albuterol (PROVENTIL HFA;VENTOLIN HFA) 108 (90 BASE) MCG/ACT inhaler Inhale 2 puffs into the lungs every 6 (six) hours as needed. Shortness of breath        Not taking Depo-Provera.  ROS Physical Exam   Blood pressure 110/73, pulse 91, temperature 97.6 F (36.4 C), temperature source Oral, resp. rate 16, height 5\' 5"  (1.651 m), weight 80.831 kg (178 lb 3.2 oz), last menstrual period 08/06/2012, SpO2 100.00%.  Physical Exam  MAU Course  Procedures  Toradol given in ED:  Some relief  of symptoms Results for orders placed during the hospital encounter of 09/09/12 (from the past 24 hour(s))  CBC     Status: Abnormal   Collection Time   09/09/12  7:55 PM      Component Value Range   WBC 5.1  4.0 - 10.5 K/uL   RBC 3.82 (*) 3.87 - 5.11 MIL/uL   Hemoglobin 10.2 (*) 12.0 - 15.0 g/dL   HCT 40.9 (*) 81.1 - 91.4 %   MCV 81.2  78.0 - 100.0 fL   MCH 26.7  26.0 - 34.0 pg   MCHC 32.9  30.0 - 36.0 g/dL   RDW 78.2  95.6 - 21.3 %   Platelets 269  150 - 400 K/uL      Assessment and Plan  36 y.o. with menometrorrhagia, fibroid uterus. CBC stable. Depo-Provera did not seem to resolve bleeding. Will try OCPs. Suggested TID x 2 days, then BID x 2 days then once daily to finish pack. Pt to make appt with Dr. Clearance Coots for followup.  Napoleon Form 09/09/2012, 7:29 PM

## 2012-10-09 ENCOUNTER — Encounter (HOSPITAL_COMMUNITY): Payer: Self-pay | Admitting: *Deleted

## 2012-10-09 ENCOUNTER — Emergency Department (HOSPITAL_COMMUNITY)
Admission: EM | Admit: 2012-10-09 | Discharge: 2012-10-10 | Disposition: A | Discharge status: Home / Self Care | Payer: Self-pay | Attending: Emergency Medicine | Admitting: Emergency Medicine

## 2012-10-09 DIAGNOSIS — G40909 Epilepsy, unspecified, not intractable, without status epilepticus: Secondary | ICD-10-CM | POA: Insufficient documentation

## 2012-10-09 DIAGNOSIS — D649 Anemia, unspecified: Secondary | ICD-10-CM | POA: Insufficient documentation

## 2012-10-09 DIAGNOSIS — Z9889 Other specified postprocedural states: Secondary | ICD-10-CM | POA: Insufficient documentation

## 2012-10-09 DIAGNOSIS — J45909 Unspecified asthma, uncomplicated: Secondary | ICD-10-CM | POA: Insufficient documentation

## 2012-10-09 DIAGNOSIS — D573 Sickle-cell trait: Secondary | ICD-10-CM | POA: Insufficient documentation

## 2012-10-09 DIAGNOSIS — N939 Abnormal uterine and vaginal bleeding, unspecified: Secondary | ICD-10-CM

## 2012-10-09 DIAGNOSIS — N898 Other specified noninflammatory disorders of vagina: Secondary | ICD-10-CM | POA: Insufficient documentation

## 2012-10-09 DIAGNOSIS — F17200 Nicotine dependence, unspecified, uncomplicated: Secondary | ICD-10-CM | POA: Insufficient documentation

## 2012-10-09 LAB — CBC WITH DIFFERENTIAL/PLATELET
Eosinophils Relative: 6 % — ABNORMAL HIGH (ref 0–5)
HCT: 30.3 % — ABNORMAL LOW (ref 36.0–46.0)
Lymphocytes Relative: 47 % — ABNORMAL HIGH (ref 12–46)
Lymphs Abs: 2.7 10*3/uL (ref 0.7–4.0)
MCV: 80.8 fL (ref 78.0–100.0)
Platelets: 291 10*3/uL (ref 150–400)
RBC: 3.75 MIL/uL — ABNORMAL LOW (ref 3.87–5.11)
WBC: 5.8 10*3/uL (ref 4.0–10.5)

## 2012-10-09 NOTE — ED Notes (Signed)
Pt reports vaginal bleeding x 4 weeks or more; was seen a [facility] 3-4 weeks ago and was given Depo and birth control pills; pt reports that she has continued to have vaginal bleeding; pt has not followed up with GYN due to loss of insurance; pt states that she is wearing a depend due to the amount of bleeding and that she bleeds through a depend every 2 hrs x 2 days. Pt c/o generalized weakness and tired.

## 2012-10-10 LAB — COMPREHENSIVE METABOLIC PANEL
ALT: 10 U/L (ref 0–35)
Alkaline Phosphatase: 49 U/L (ref 39–117)
CO2: 22 mEq/L (ref 19–32)
Calcium: 8.6 mg/dL (ref 8.4–10.5)
Chloride: 105 mEq/L (ref 96–112)
GFR calc Af Amer: >90 mL/min (ref >90)
GFR calc non Af Amer: >90 mL/min (ref >90)
Glucose, Bld: 98 mg/dL (ref 70–99)
Sodium: 137 mEq/L (ref 135–145)
Total Bilirubin: 0.1 mg/dL — ABNORMAL LOW (ref 0.3–1.2)

## 2012-10-10 IMAGING — US US TRANSVAGINAL NON-OB
1 series · 14 of 25 positions shown · non-contrast
Comparison: None.

CLINICAL DATA: Heavy bleeding, 2 weeks duration, passing clots,
pain

TRANSABDOMINAL AND TRANSVAGINAL ULTRASOUND OF PELVIS
TECHNIQUE: Both transabdominal and transvaginal ultrasound
examinations of the pelvis were performed. Transabdominal technique
was performed for global imaging of the pelvis including uterus,
ovaries, adnexal regions, and pelvic cul-de-sac.

[Series 1: us pelvis complete · 14 of 51 slices shown]
[im 1/51]
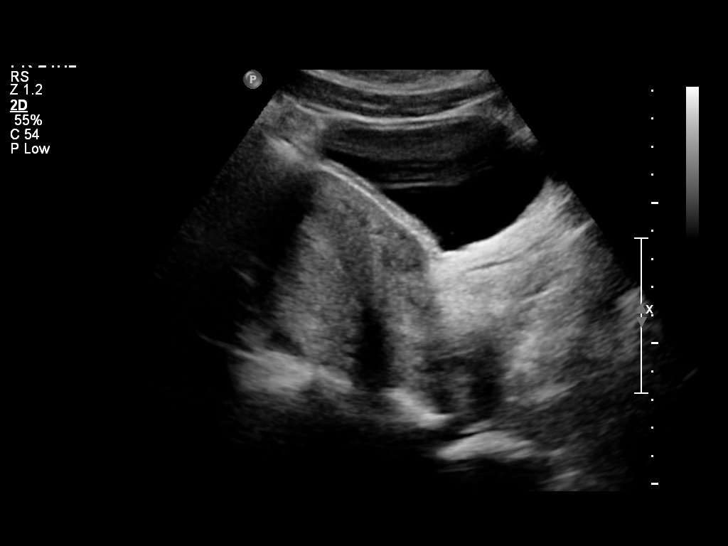
[im 5/51]
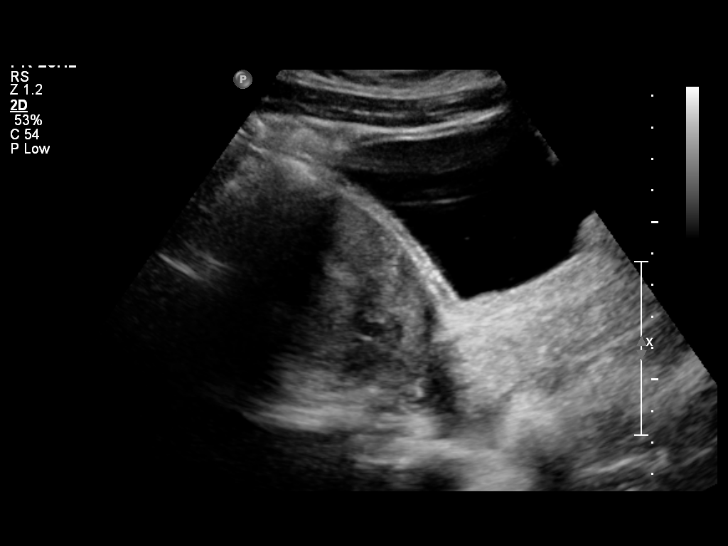
[im 9/51]
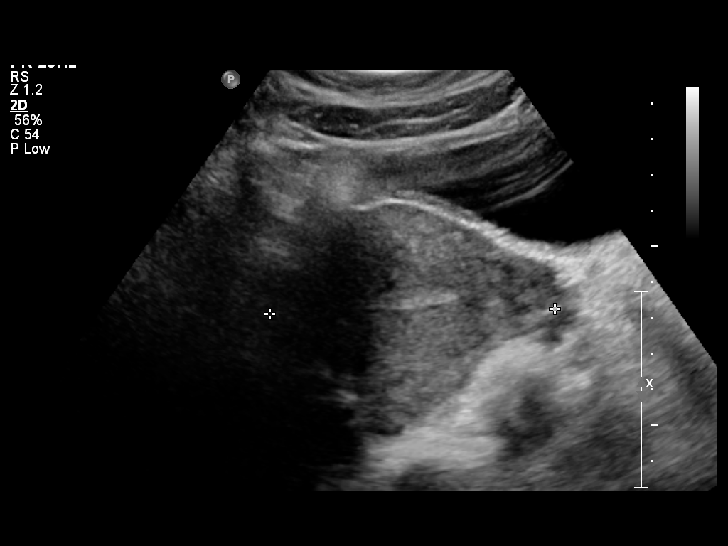
[im 13/51]
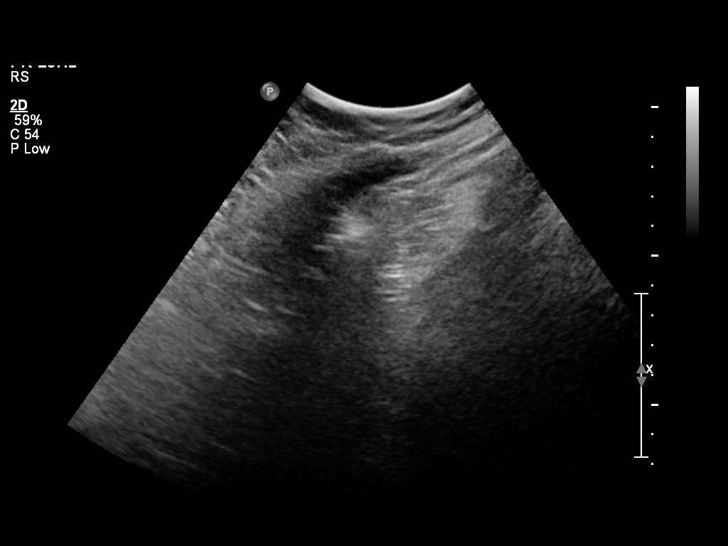
[im 17/51]
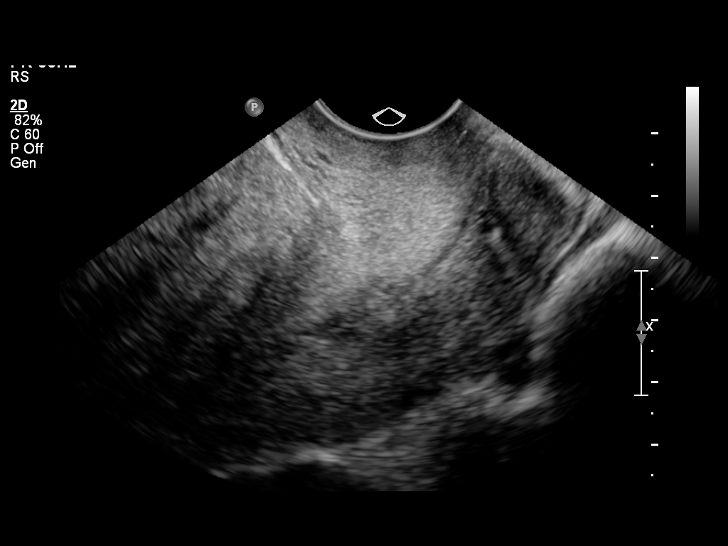
[im 19/51]
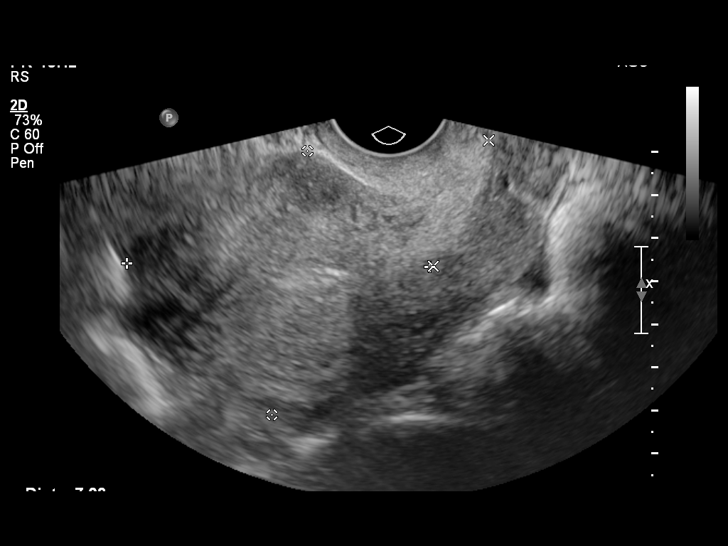
[im 23/51]
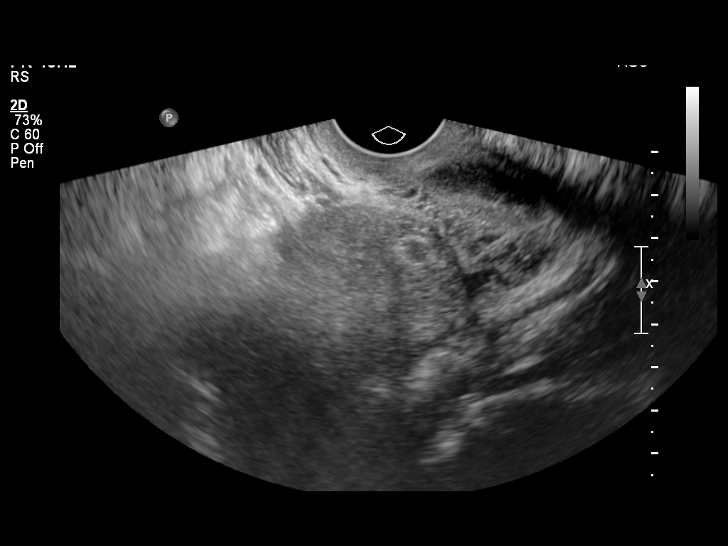
[im 28/51]
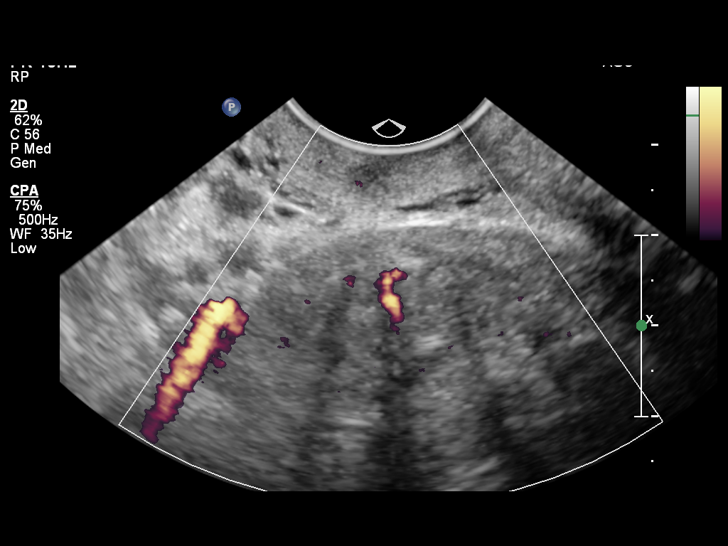
[im 32/51]
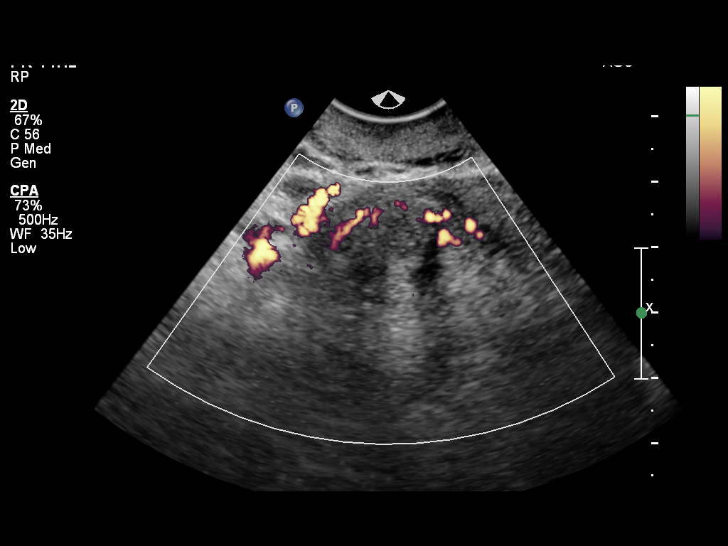
[im 34/51]
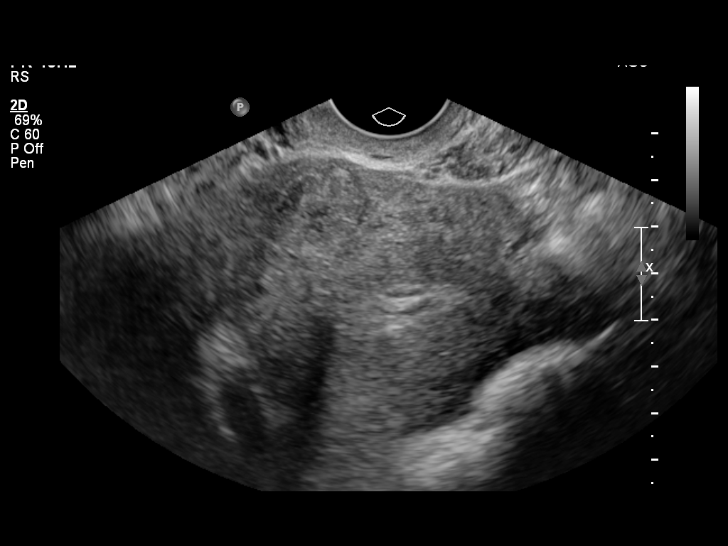
[im 38/51]
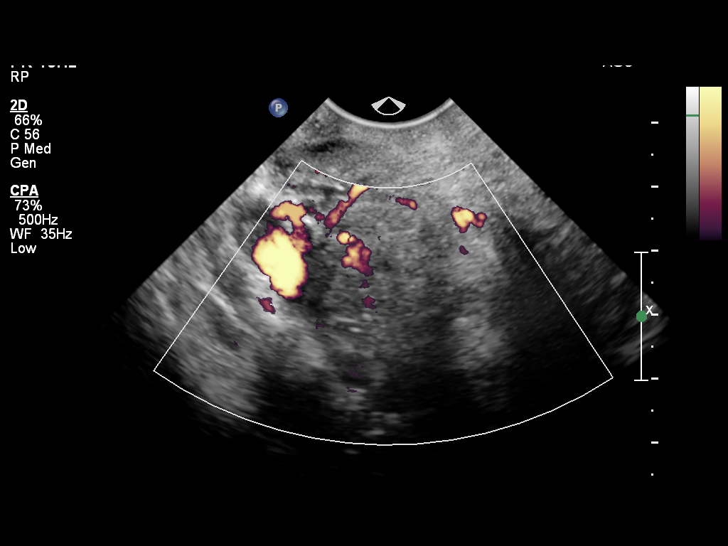
[im 42/51]
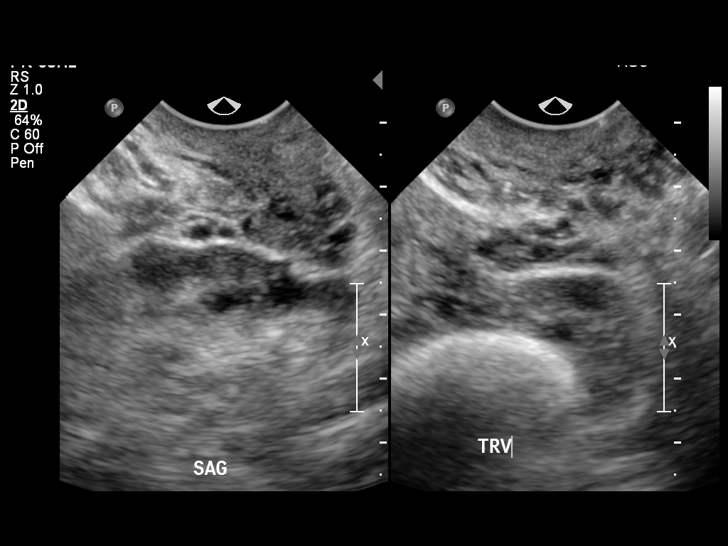
[im 46/51]
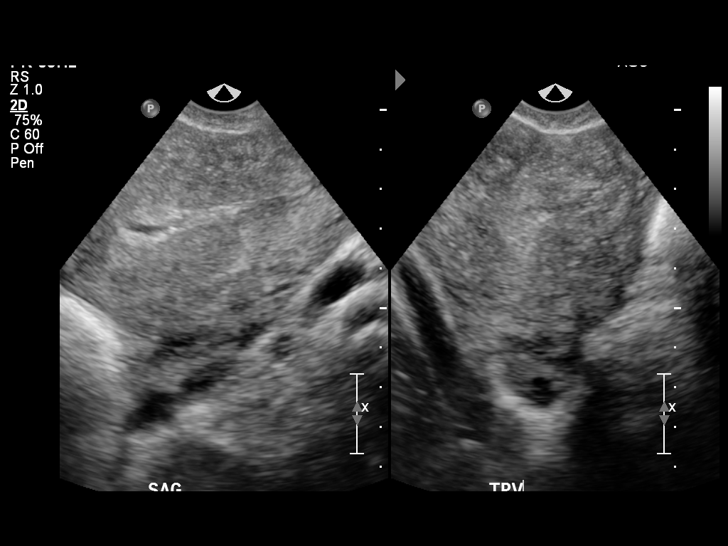
[im 51/51]
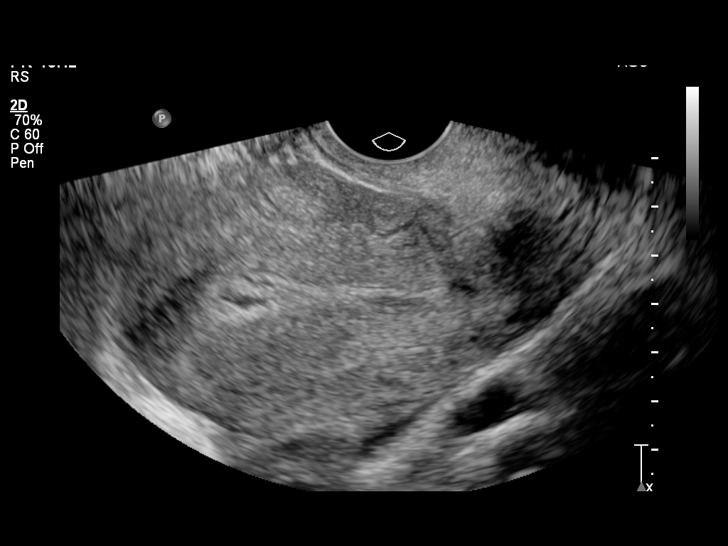

[14 of 25 positions shown; findings below may reference images not displayed]

It was necessary to proceed with endovaginal exam following the
transabdominal exam to visualize the right ovary and adnexa.
FINDINGS: Uterus: 10.2 cm length by 6.2 cm AP by 7.8 cm transverse.  Small
leiomyomata up to 1.9 cm diameter.

Endometrium: Mildly thickened, 12 mm thick.  Small amount of
endometrial fluid.

Right ovary:  3.0 x 2.0 x 1.8 cm.  Normal morphology without mass.

Left ovary: 2.8 x 1.1 x 1.6 cm.  Normal morphology without mass.

Other findings: No free fluid
IMPRESSION: Multiple small uterine leiomyomata.
Minimally prominent endometrial complex 12 mm thick, containing
fluid, nonspecific.
Otherwise negative exam..

## 2012-10-10 MED ORDER — IBUPROFEN 800 MG PO TABS
800.0000 mg | ORAL_TABLET | Freq: Once | ORAL | Status: AC
  Administered 2012-10-10: 800 mg via ORAL
  Filled 2012-10-10: qty 1

## 2012-10-10 NOTE — ED Provider Notes (Signed)
Medical screening examination/treatment/procedure(s) were performed by non-physician practitioner and as supervising physician I was immediately available for consultation/collaboration.  Sunnie Nielsen, MD 10/10/12 [phone]

## 2012-10-10 NOTE — ED Provider Notes (Signed)
History     CSN: [id number]  Arrival date & time 10/09/12  2158   First MD Initiated Contact with Patient 10/10/12 0118      Chief Complaint  Patient presents with  . Vaginal Bleeding    (Consider location/radiation/quality/duration/timing/severity/associated sxs/prior treatment) Patient is a 36 y.o. female presenting with vaginal bleeding. The history is provided by the patient and a relative. No language interpreter was used.  Vaginal Bleeding This is a chronic problem. The current episode started more than 1 month ago. The problem occurs daily. The problem has been unchanged. Associated symptoms include weakness. Pertinent negatives include no abdominal pain, fever, nausea, urinary symptoms or vomiting. Treatments tried: depo and birth control pills.   36 year old female with abnormal vaginal bleeding x more than a month. Patient was seen at [facility] hospital 3 or 4 weeks ago and given Depakote birth control pills with no results. Patient is concerned that her hemoglobin and strawberry lytic issues. Week. States that she has bled through a depends every 2 hours for 2 days. pmh listed below.  Denies dizziness.   Past Medical History  Diagnosis Date  . Anemia   . Asthma   . Blood transfusion 2011    r/t anemia  . Migraine   . Sickle cell trait   . Seizures     last sz Jan 2012  . Seizures     Past Surgical History  Procedure Date  . Tubal ligation 2005    Family History  Problem Relation Age of Onset  . Anesthesia problems Neg Hx     History  Substance Use Topics  . Smoking status: Current Some Day Smoker  . Smokeless tobacco: Not on file  . Alcohol Use: No    OB History    Grav Para Term Preterm Abortions TAB SAB Ect Mult Living   3 2 1 1 1  0 0 1 0 2      Review of Systems  Constitutional: Negative for fever.  HENT: Negative.   Eyes: Negative.   Respiratory: Negative.   Cardiovascular: Negative.   Gastrointestinal: Negative.  Negative for nausea,  vomiting and abdominal pain.  Genitourinary: Positive for vaginal bleeding. Negative for vaginal discharge, vaginal pain and pelvic pain.  Neurological: Positive for weakness. Negative for dizziness and light-headedness.  Psychiatric/Behavioral: Negative.   All other systems reviewed and are negative.    Allergies  Penicillins and Sulfa antibiotics  Home Medications   Current Outpatient Rx  Name Route Sig Dispense Refill  . ALBUTEROL SULFATE HFA 108 (90 BASE) MCG/ACT IN AERS Inhalation Inhale 2 puffs into the lungs every 6 (six) hours as needed. Shortness of breath     . CARBAMAZEPINE 200 MG PO TABS Oral Take 400 mg by mouth 2 (two) times daily.     . IBUPROFEN 800 MG PO TABS Oral Take 1 tablet (800 mg total) by mouth 3 (three) times daily. 30 tablet 0  . NORGESTIMATE-ETH ESTRADIOL 0.25-35 MG-MCG PO TABS  Take one tablet three times a day for 2 days. Then take one tablet 2 times a day for 2 days. Then take one tablet a day to finish the pack. 1 Package 0  . OXYCODONE-ACETAMINOPHEN 5-325 MG PO TABS Oral Take 1 tablet by mouth every 4 (four) hours as needed. For occasional migraine headaches    . PHENYTOIN SODIUM EXTENDED 100 MG PO CAPS Oral Take 100 mg by mouth 4 (four) times daily.     Marland Kitchen PROMETHAZINE HCL 25 MG PO TABS Oral Take  1 tablet (25 mg total) by mouth every 6 (six) hours as needed for nausea. 20 tablet 0  . TOPIRAMATE 50 MG PO TABS Oral Take 50 mg by mouth 2 (two) times daily.        BP 116/73  Pulse 87  Temp 99.4 F (37.4 C) (Oral)  Resp 18  SpO2 100%  LMP 09/09/2012  Physical Exam  Nursing note and vitals reviewed. Constitutional: She is oriented to person, place, and time. She appears well-developed and well-nourished.  HENT:  Head: Normocephalic and atraumatic.  Eyes: Conjunctivae normal and EOM are normal. Pupils are equal, round, and reactive to light.  Neck: Normal range of motion. Neck supple.  Cardiovascular: Normal rate.   Pulmonary/Chest: Effort normal.    Abdominal: Soft.  Genitourinary: Uterus normal. Cervix exhibits no motion tenderness, no discharge and no friability. Right adnexum displays no tenderness. Left adnexum displays no tenderness. There is bleeding around the vagina. No erythema around the vagina. No vaginal discharge found.  Musculoskeletal: Normal range of motion. She exhibits no edema and no tenderness.  Neurological: She is alert and oriented to person, place, and time. She has normal reflexes.  Skin: Skin is warm and dry.  Psychiatric: She has a normal mood and affect.    ED Course  Pelvic exam Date/Time: 10/10/2012 3:32 AM Performed by: Remi Haggard Authorized by: Remi Haggard Consent: Verbal consent obtained. Written consent not obtained. Risks and benefits: risks, benefits and alternatives were discussed Consent given by: patient Patient understanding: patient states understanding of the procedure being performed Patient identity confirmed: verbally with patient, arm band, provided demographic data and hospital-assigned identification number Local anesthesia used: no Patient sedated: no Patient tolerance: Patient tolerated the procedure well with no immediate complications.  Pelvic exam Date/Time: 10/09/2012 11:10 PM Performed by: Remi Haggard Authorized by: Remi Haggard Consent: Verbal consent obtained. Written consent not obtained. Risks and benefits: risks, benefits and alternatives were discussed Consent given by: patient Patient understanding: patient states understanding of the procedure being performed Patient identity confirmed: verbally with patient, arm band, provided demographic data and hospital-assigned identification number Local anesthesia used: no Patient sedated: no Patient tolerance: Patient tolerated the procedure well with no immediate complications.   (including critical care time)  Labs Reviewed  CBC WITH DIFFERENTIAL - Abnormal; Notable for the following:    RBC 3.75 (*)      Hemoglobin 10.1 (*)     HCT 30.3 (*)     Neutrophils Relative 41 (*)     Lymphocytes Relative 47 (*)     Eosinophils Relative 6 (*)     All other components within normal limits  COMPREHENSIVE METABOLIC PANEL - Abnormal; Notable for the following:    Total Bilirubin 0.1 (*)     All other components within normal limits  URINALYSIS, ROUTINE W REFLEX MICROSCOPIC  GC/CHLAMYDIA PROBE AMP, GENITAL  WET PREP, GENITAL   No results found.   No diagnosis found.    MDM  35 year old female coming in with uterine fibroids abnormal vaginal bleeding x4 weeks. Patient was treated at the [facility] hospital with a Depo shot and birth control pills one month ago. Patient continues to bleed. Her hemoglobin is 10.1 today. She is not dizzy. [facility] Patient understands to return for worsening symptoms. Hgb 10.1.  Pelvic exam unremakable except for bleeding.    Labs Reviewed  CBC WITH DIFFERENTIAL - Abnormal; Notable for the following:    RBC 3.75 (*)  Hemoglobin 10.1 (*)     HCT 30.3 (*)     Neutrophils Relative 41 (*)     Lymphocytes Relative 47 (*)     Eosinophils Relative 6 (*)     All other components within normal limits  COMPREHENSIVE METABOLIC PANEL - Abnormal; Notable for the following:    Total Bilirubin 0.1 (*)     All other components within normal limits  WET PREP, GENITAL - Abnormal; Notable for the following:    Yeast Wet Prep HPF POC RARE (*)     All other components within normal limits  GC/CHLAMYDIA PROBE AMP, GENITAL          Remi Haggard, NP 10/10/12 1711

## 2012-10-30 ENCOUNTER — Emergency Department (HOSPITAL_COMMUNITY)
Admission: EM | Admit: 2012-10-30 | Discharge: 2012-10-30 | Disposition: A | Discharge status: Home / Self Care | Payer: Self-pay | Attending: Emergency Medicine | Admitting: Emergency Medicine

## 2012-10-30 ENCOUNTER — Encounter (HOSPITAL_COMMUNITY): Payer: Self-pay | Admitting: Emergency Medicine

## 2012-10-30 ENCOUNTER — Emergency Department (HOSPITAL_COMMUNITY): Payer: Self-pay

## 2012-10-30 DIAGNOSIS — R112 Nausea with vomiting, unspecified: Secondary | ICD-10-CM | POA: Insufficient documentation

## 2012-10-30 DIAGNOSIS — Z79899 Other long term (current) drug therapy: Secondary | ICD-10-CM | POA: Insufficient documentation

## 2012-10-30 DIAGNOSIS — R51 Headache: Secondary | ICD-10-CM | POA: Insufficient documentation

## 2012-10-30 DIAGNOSIS — D573 Sickle-cell trait: Secondary | ICD-10-CM | POA: Insufficient documentation

## 2012-10-30 DIAGNOSIS — R079 Chest pain, unspecified: Secondary | ICD-10-CM | POA: Insufficient documentation

## 2012-10-30 DIAGNOSIS — J069 Acute upper respiratory infection, unspecified: Secondary | ICD-10-CM | POA: Insufficient documentation

## 2012-10-30 DIAGNOSIS — Z862 Personal history of diseases of the blood and blood-forming organs and certain disorders involving the immune mechanism: Secondary | ICD-10-CM | POA: Insufficient documentation

## 2012-10-30 DIAGNOSIS — R5381 Other malaise: Secondary | ICD-10-CM | POA: Insufficient documentation

## 2012-10-30 DIAGNOSIS — G43909 Migraine, unspecified, not intractable, without status migrainosus: Secondary | ICD-10-CM | POA: Insufficient documentation

## 2012-10-30 DIAGNOSIS — R569 Unspecified convulsions: Secondary | ICD-10-CM | POA: Insufficient documentation

## 2012-10-30 DIAGNOSIS — J329 Chronic sinusitis, unspecified: Secondary | ICD-10-CM | POA: Insufficient documentation

## 2012-10-30 DIAGNOSIS — J45909 Unspecified asthma, uncomplicated: Secondary | ICD-10-CM | POA: Insufficient documentation

## 2012-10-30 MED ORDER — OXYMETAZOLINE HCL 0.05 % NA SOLN
1.0000 | Freq: Once | NASAL | Status: AC
  Administered 2012-10-30: 1 via NASAL
  Filled 2012-10-30: qty 15

## 2012-10-30 MED ORDER — HYDROCOD POLST-CHLORPHEN POLST 10-8 MG/5ML PO LQCR
5.0000 mL | Freq: Once | ORAL | Status: AC
  Administered 2012-10-30: 5 mL via ORAL
  Filled 2012-10-30: qty 5

## 2012-10-30 MED ORDER — AZITHROMYCIN 250 MG PO TABS
500.0000 mg | ORAL_TABLET | Freq: Once | ORAL | Status: AC
  Administered 2012-10-30: 500 mg via ORAL
  Filled 2012-10-30: qty 2

## 2012-10-30 MED ORDER — HYDROCODONE-HOMATROPINE 5-1.5 MG/5ML PO SYRP: 5.0000 mL | ORAL_SOLUTION | Freq: Four times a day (QID) | ORAL | Status: DC | PRN

## 2012-10-30 MED ORDER — AZITHROMYCIN 250 MG PO TABS: ORAL_TABLET | ORAL | Status: DC

## 2012-10-30 NOTE — ED Notes (Signed)
Pt c/o nasal congestion and URI with pain in chest worse with inspiration x 4 days; pt c/o generalized body aches

## 2012-10-30 NOTE — ED Provider Notes (Signed)
History     CSN: [id number]  Arrival date & time 10/30/12  1257   First MD Initiated Contact with Patient 10/30/12 1407      Chief Complaint  Patient presents with  . Chest Pain  . URI  . Nasal Congestion    (Consider location/radiation/quality/duration/timing/severity/associated sxs/prior treatment) HPI Nicole [name] is a 36 y.o. female who presents with complaint of nasal congestion, sinus pain, cough, body aches. Pt states symptoms began about 4 days ago. Taking her inhaler, ibuprofen, over the counter flu medication with no improvement. Stats blowing her nose with thick green sputum. Elevated temperature at home up to 100.5. Pt denies neck pain or stiffness. Admits to nausea, vomiting, no abdominal pain, no urinary symptoms. Hx of asthma.    Past Medical History  Diagnosis Date  . Anemia   . Asthma   . Blood transfusion 2011    r/t anemia  . Migraine   . Sickle cell trait   . Seizures     last sz Jan 2012  . Seizures     Past Surgical History  Procedure Date  . Tubal ligation 2005    Family History  Problem Relation Age of Onset  . Anesthesia problems Neg Hx     History  Substance Use Topics  . Smoking status: Current Some Day Smoker  . Smokeless tobacco: Not on file  . Alcohol Use: No    OB History    Grav Para Term Preterm Abortions TAB SAB Ect Mult Living   3 2 1 1 1  0 0 1 0 2      Review of Systems  Constitutional: Positive for fever, chills and fatigue.  HENT: Positive for congestion, sore throat, sneezing, postnasal drip and sinus pressure. Negative for neck pain and neck stiffness.   Eyes: Positive for itching.  Respiratory: Positive for cough and wheezing. Negative for chest tightness and shortness of breath.   Cardiovascular: Negative.   Gastrointestinal: Positive for nausea and vomiting. Negative for abdominal pain.  Genitourinary: Negative for dysuria.  Musculoskeletal: Negative.   Skin: Negative.   Neurological: Positive for weakness  and headaches. Negative for dizziness and numbness.  All other systems reviewed and are negative.    Allergies  Penicillins and Sulfa antibiotics  Home Medications   Current Outpatient Rx  Name  Route  Sig  Dispense  Refill  . ALBUTEROL SULFATE HFA 108 (90 BASE) MCG/ACT IN AERS   Inhalation   Inhale 2 puffs into the lungs every 6 (six) hours as needed. Shortness of breath          . CARBAMAZEPINE 200 MG PO TABS   Oral   Take 400 mg by mouth 2 (two) times daily.          . IBUPROFEN 800 MG PO TABS   Oral   Take 1 tablet (800 mg total) by mouth 3 (three) times daily.   30 tablet   0   . PHENYTOIN SODIUM EXTENDED 100 MG PO CAPS   Oral   Take 100 mg by mouth 4 (four) times daily.          Marland Kitchen PROMETHAZINE HCL 25 MG PO TABS   Oral   Take 1 tablet (25 mg total) by mouth every 6 (six) hours as needed for nausea.   20 tablet   0   . TOPIRAMATE 50 MG PO TABS   Oral   Take 50 mg by mouth 2 (two) times daily.           Marland Kitchen  AZITHROMYCIN 250 MG PO TABS      One tab PO QD   4 tablet   0   . HYDROCODONE-HOMATROPINE 5-1.5 MG/5ML PO SYRP   Oral   Take 5 mLs by mouth every 6 (six) hours as needed for cough.   120 mL   0     BP 102/75  Pulse 79  Temp 97.9 F (36.6 C) (Oral)  Resp 15  SpO2 100%  LMP 09/29/2012  Physical Exam  Nursing note and vitals reviewed. Constitutional: She is oriented to person, place, and time. She appears well-developed and well-nourished. No distress.  HENT:  Head: Normocephalic and atraumatic.  Right Ear: Tympanic membrane, external ear and ear canal normal.  Left Ear: Tympanic membrane, external ear and ear canal normal.  Nose: Rhinorrhea present. Right sinus exhibits maxillary sinus tenderness and frontal sinus tenderness. Left sinus exhibits maxillary sinus tenderness and frontal sinus tenderness.  Mouth/Throat: Uvula is midline and oropharynx is clear and moist.  Eyes: Conjunctivae normal are normal.  Neck: Normal range of motion.  Neck supple.  Cardiovascular: Normal rate, regular rhythm and normal heart sounds.   Pulmonary/Chest: Effort normal. No respiratory distress. She has wheezes. She has no rales.  Abdominal: Soft. Bowel sounds are normal. She exhibits no distension. There is no tenderness. There is no rebound.  Musculoskeletal: She exhibits no edema.  Lymphadenopathy:    She has no cervical adenopathy.  Neurological: She is alert and oriented to person, place, and time.  Skin: Skin is warm and dry.    ED Course  Procedures (including critical care time)  Labs Reviewed - No data to display Dg Chest 2 View  10/30/2012  *RADIOLOGY REPORT*  Clinical Data: Chest pain.  Cough  CHEST - 2 VIEW  Comparison: 08/09/2012  Findings: Heart size and vascularity are normal.  Lungs are clear without infiltrate or effusion.  No mass lesion.  IMPRESSION: Negative   Original Report Authenticated By: Janeece Riggers, M.D.      1. Sinusitis   2. URI (upper respiratory infection)       MDM  Pt with URI symptoms, sinus pressure and tendernss. Vs normal. Will treat with rest, PO fluids at home, cough medication, inhaler for cough and wheezing, z-pack for bronchitis/sinusitis.   Filed Vitals:   10/30/12 1345 10/30/12 1415 10/30/12 1500 10/30/12 1530  BP: 124/83 109/89 99/56 102/75  Pulse: 75 81 84 79  Temp:      TempSrc:      Resp: 16 16 19 15   SpO2: 100% 100% 100% 100%          Lottie Mussel, PA 10/30/12 1706

## 2012-11-02 NOTE — ED Provider Notes (Signed)
Medical screening examination/treatment/procedure(s) were performed by non-physician practitioner and as supervising physician I was immediately available for consultation/collaboration.  Elliannah Wayment, MD 11/02/12 0207 

## 2012-12-04 IMAGING — CR DG SHOULDER 2+V*L*
3 series · 3 of 3 positions shown · non-contrast
Comparison: None.

CLINICAL DATA: Left shoulder pain.

LEFT SHOULDER - 2+ VIEW

[w shoulder ap internal left]
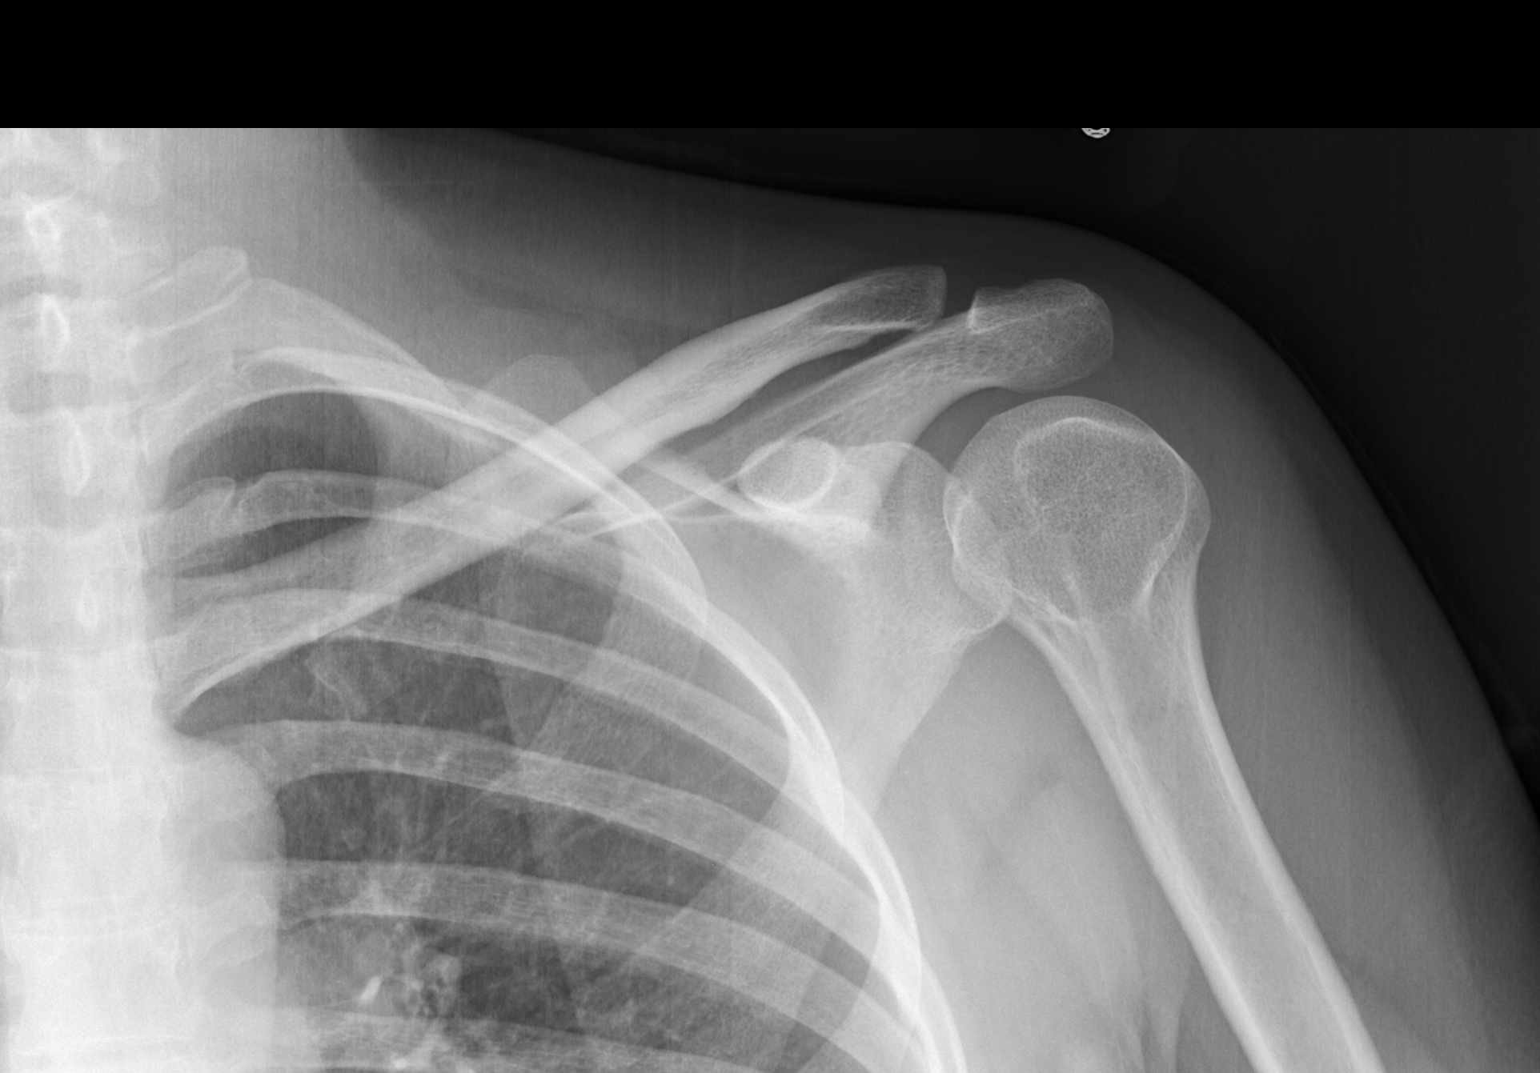

[w shoulder ap external left]
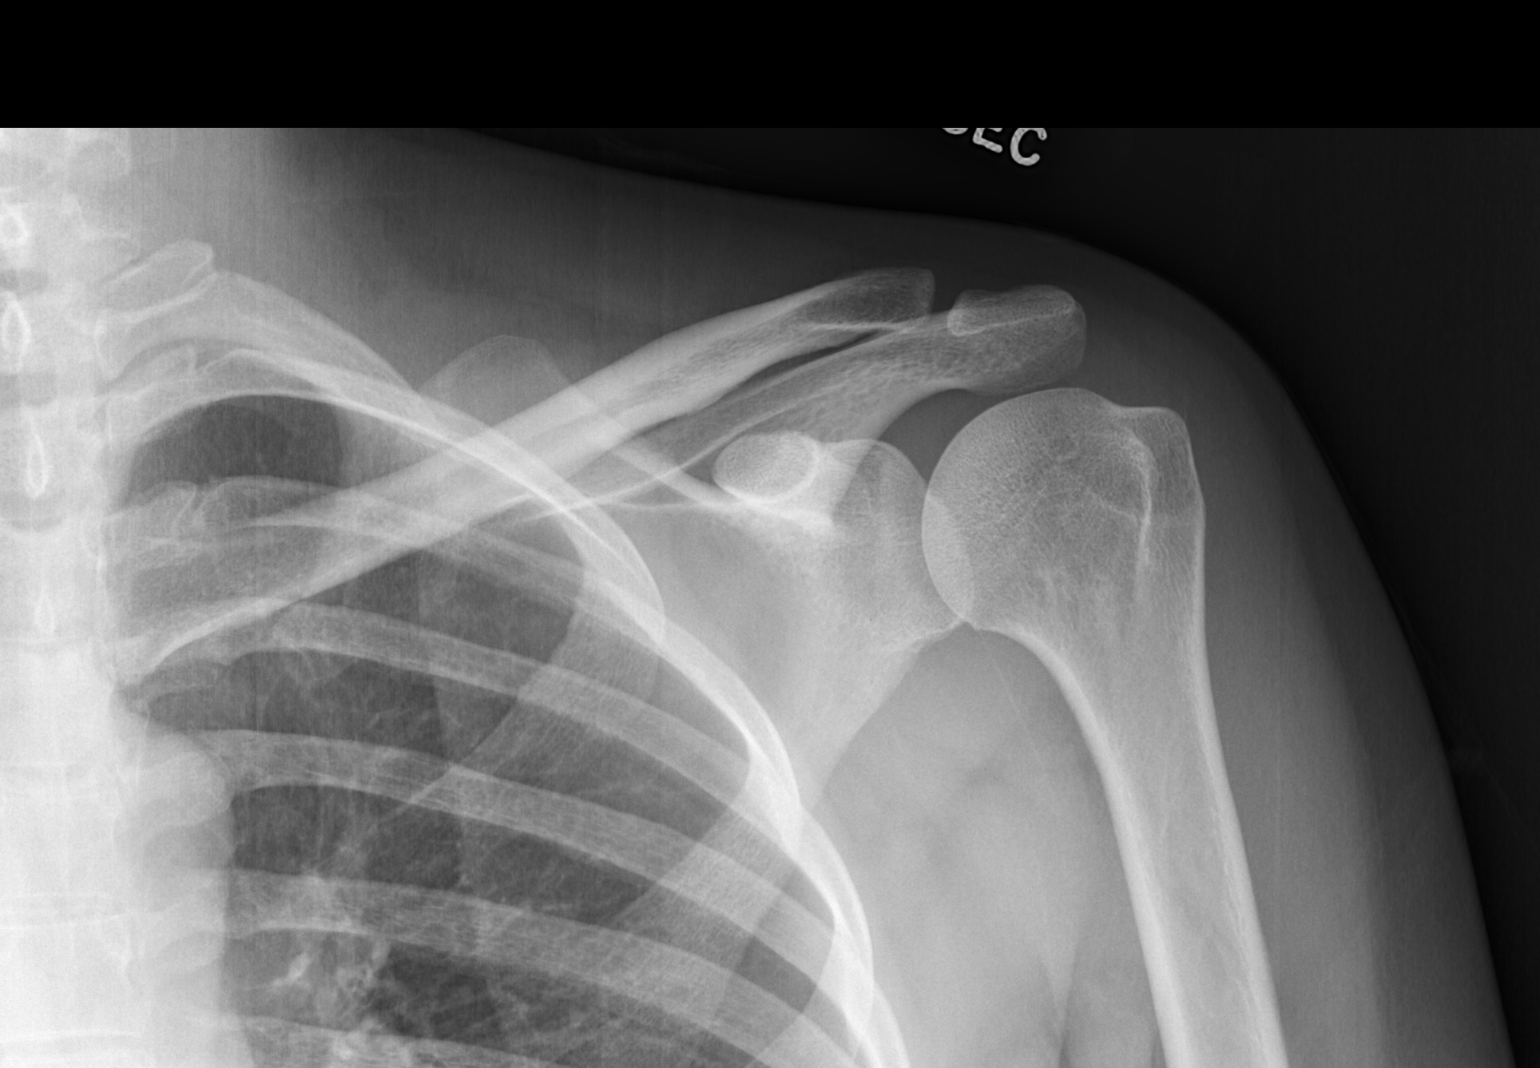

[w shoulder y view left]
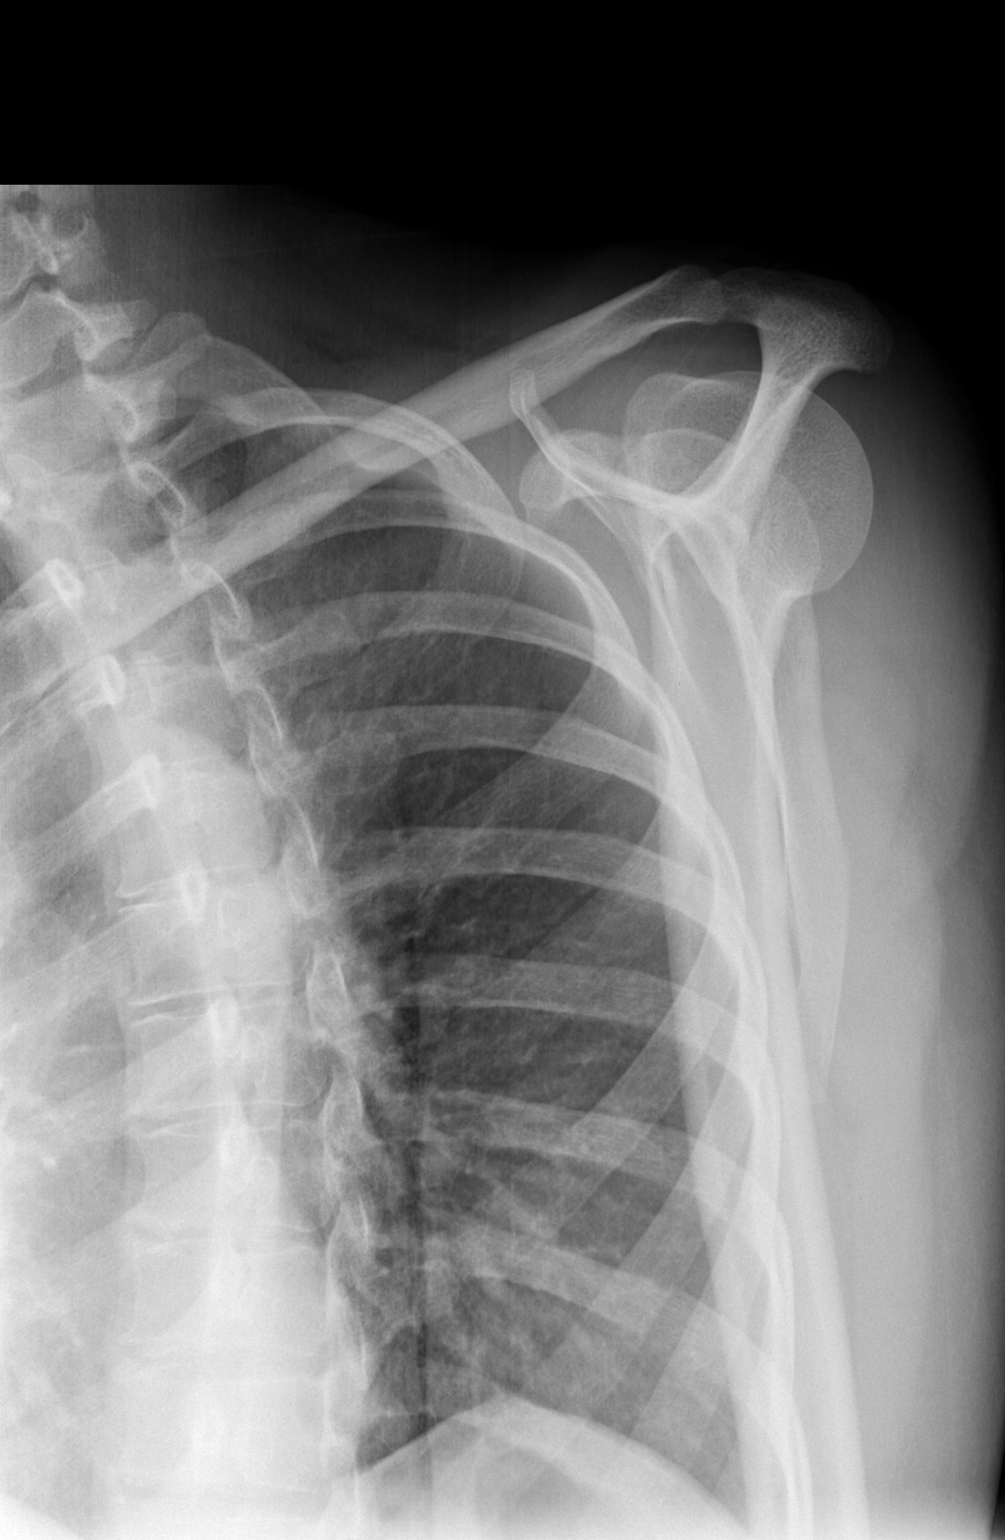

[3 of 3 positions shown; findings below may reference images not displayed]

FINDINGS: There is no evidence of fracture or dislocation.  There
is no evidence of arthropathy or other focal bone abnormality.
Soft tissues are unremarkable.
IMPRESSION: Negative.

## 2012-12-16 ENCOUNTER — Other Ambulatory Visit: Payer: Self-pay

## 2012-12-16 ENCOUNTER — Emergency Department (HOSPITAL_COMMUNITY)
Admission: EM | Admit: 2012-12-16 | Discharge: 2012-12-16 | Disposition: A | Discharge status: Home / Self Care | Payer: Self-pay | Attending: Emergency Medicine | Admitting: Emergency Medicine

## 2012-12-16 ENCOUNTER — Encounter (HOSPITAL_COMMUNITY): Payer: Self-pay | Admitting: Emergency Medicine

## 2012-12-16 ENCOUNTER — Emergency Department (HOSPITAL_COMMUNITY): Payer: Self-pay

## 2012-12-16 DIAGNOSIS — IMO0001 Reserved for inherently not codable concepts without codable children: Secondary | ICD-10-CM | POA: Insufficient documentation

## 2012-12-16 DIAGNOSIS — J45909 Unspecified asthma, uncomplicated: Secondary | ICD-10-CM | POA: Insufficient documentation

## 2012-12-16 DIAGNOSIS — Z3202 Encounter for pregnancy test, result negative: Secondary | ICD-10-CM | POA: Insufficient documentation

## 2012-12-16 DIAGNOSIS — F17200 Nicotine dependence, unspecified, uncomplicated: Secondary | ICD-10-CM | POA: Insufficient documentation

## 2012-12-16 DIAGNOSIS — D573 Sickle-cell trait: Secondary | ICD-10-CM | POA: Insufficient documentation

## 2012-12-16 DIAGNOSIS — Z862 Personal history of diseases of the blood and blood-forming organs and certain disorders involving the immune mechanism: Secondary | ICD-10-CM | POA: Insufficient documentation

## 2012-12-16 DIAGNOSIS — R079 Chest pain, unspecified: Secondary | ICD-10-CM

## 2012-12-16 DIAGNOSIS — Z8679 Personal history of other diseases of the circulatory system: Secondary | ICD-10-CM | POA: Insufficient documentation

## 2012-12-16 DIAGNOSIS — G40909 Epilepsy, unspecified, not intractable, without status epilepticus: Secondary | ICD-10-CM | POA: Insufficient documentation

## 2012-12-16 DIAGNOSIS — F430 Acute stress reaction: Secondary | ICD-10-CM | POA: Insufficient documentation

## 2012-12-16 DIAGNOSIS — M791 Myalgia, unspecified site: Secondary | ICD-10-CM

## 2012-12-16 DIAGNOSIS — Z79899 Other long term (current) drug therapy: Secondary | ICD-10-CM | POA: Insufficient documentation

## 2012-12-16 DIAGNOSIS — F439 Reaction to severe stress, unspecified: Secondary | ICD-10-CM

## 2012-12-16 LAB — POCT I-STAT, CHEM 8
Glucose, Bld: 100 mg/dL — ABNORMAL HIGH (ref 70–99)
HCT: 33 % — ABNORMAL LOW (ref 36.0–46.0)
Hemoglobin: 11.2 g/dL — ABNORMAL LOW (ref 12.0–15.0)
Potassium: 3.8 mEq/L (ref 3.5–5.1)
Sodium: 141 mEq/L (ref 135–145)
TCO2: 24 mmol/L (ref 0–100)

## 2012-12-16 LAB — CBC WITH DIFFERENTIAL/PLATELET
Basophils Absolute: 0.1 10*3/uL (ref 0.0–0.1)
Basophils Relative: 2 % — ABNORMAL HIGH (ref 0–1)
Eosinophils Absolute: 0.6 10*3/uL (ref 0.0–0.7)
MCHC: 33.1 g/dL (ref 30.0–36.0)
Neutro Abs: 2.9 10*3/uL (ref 1.7–7.7)
Neutrophils Relative %: 45 % (ref 43–77)
Platelets: 269 10*3/uL (ref 150–400)
RDW: 13.8 % (ref 11.5–15.5)

## 2012-12-16 LAB — URINALYSIS, ROUTINE W REFLEX MICROSCOPIC
Nitrite: NEGATIVE
Protein, ur: NEGATIVE mg/dL
Specific Gravity, Urine: 1.019 (ref 1.005–1.030)
Urobilinogen, UA: 0.2 mg/dL (ref 0.0–1.0)

## 2012-12-16 MED ORDER — LORAZEPAM 1 MG PO TABS: 1.0000 mg | ORAL_TABLET | Freq: Three times a day (TID) | ORAL | Status: DC | PRN

## 2012-12-16 MED ORDER — ZOLPIDEM TARTRATE 5 MG PO TABS: 5.0000 mg | ORAL_TABLET | Freq: Every evening | ORAL | Status: DC | PRN

## 2012-12-16 NOTE — ED Notes (Signed)
Patient states she has had intermittent chest pain wall pain x 4 months with slight dizziness.  Patient's sister died on 2023-05-04 and she hasn't been sleeping or eating right since.

## 2012-12-16 NOTE — ED Provider Notes (Signed)
History    This chart was scribed for Nicole Kras, MD by Nicole [name]. The patient was seen in room WTR5/WTR5. Patient's care was started at 1525.  CSN: [id number]  Arrival date & time 12/16/12  1525   First MD Initiated Contact with Patient 12/16/12 1535      No chief complaint on file.   (Consider location/radiation/quality/duration/timing/severity/associated sxs/prior treatment) The history is provided by the patient. No language interpreter was used.   Nicole [name] is a 37 y.o. female who presents to the Emergency Department complaining of constant moderate bodyaches and chest pain onset 2 months ago. Body aches ar all over, mainly in muscles. She describes the chest pain as a waxing and waning stabbing sensation. It is not worsened by excerption, deep breathing, palpation. Patient reports runny nose, sneezing, congestion at night, She denies cough or sore throat. Patient took OTC tylenol and motrin but with no relief of symptoms. She also had a fever of 100.1 at home few days ago. Patient has not seen her PCP lately.     Past Medical History  Diagnosis Date  . Anemia   . Asthma   . Blood transfusion 2011    r/t anemia  . Migraine   . Sickle cell trait   . Seizures     last sz Jan 2012  . Seizures     Past Surgical History  Procedure Date  . Tubal ligation 2005    Family History  Problem Relation Age of Onset  . Anesthesia problems Neg Hx     History  Substance Use Topics  . Smoking status: Current Some Day Smoker  . Smokeless tobacco: Not on file  . Alcohol Use: No    OB History    Grav Para Term Preterm Abortions TAB SAB Ect Mult Living   3 2 1 1 1  0 0 1 0 2      Review of Systems  Constitutional: Negative.   HENT: Positive for congestion, rhinorrhea and sneezing.   Eyes: Negative.   Respiratory: Positive for cough.   Cardiovascular: Positive for chest pain.  Gastrointestinal: Negative.   Musculoskeletal: Negative.   Neurological: Positive  for headaches.  Hematological: Negative.   Psychiatric/Behavioral: Negative.   All other systems reviewed and are negative.  A complete 10 system review of systems was obtained and all systems are negative except as noted in the HPI and PMH.    Allergies  Penicillins and Sulfa antibiotics  Home Medications   Current Outpatient Rx  Name  Route  Sig  Dispense  Refill  . ALBUTEROL SULFATE HFA 108 (90 BASE) MCG/ACT IN AERS   Inhalation   Inhale 2 puffs into the lungs every 6 (six) hours as needed. Shortness of breath          . AZITHROMYCIN 250 MG PO TABS      One tab PO QD   4 tablet   0   . CARBAMAZEPINE 200 MG PO TABS   Oral   Take 400 mg by mouth 2 (two) times daily.          Marland Kitchen HYDROCODONE-HOMATROPINE 5-1.5 MG/5ML PO SYRP   Oral   Take 5 mLs by mouth every 6 (six) hours as needed for cough.   120 mL   0   . IBUPROFEN 800 MG PO TABS   Oral   Take 1 tablet (800 mg total) by mouth 3 (three) times daily.   30 tablet   0   . PHENYTOIN  SODIUM EXTENDED 100 MG PO CAPS   Oral   Take 100 mg by mouth 4 (four) times daily.          Marland Kitchen PROMETHAZINE HCL 25 MG PO TABS   Oral   Take 1 tablet (25 mg total) by mouth every 6 (six) hours as needed for nausea.   20 tablet   0   . TOPIRAMATE 50 MG PO TABS   Oral   Take 50 mg by mouth 2 (two) times daily.             BP 114/69  Pulse 88  Temp 98.2 F (36.8 C)  Resp 16  SpO2 100%  Physical Exam  Nursing note and vitals reviewed. Constitutional: She appears well-developed.  HENT:  Head: Normocephalic.  Right Ear: External ear normal.  Left Ear: External ear normal.  Nose: Nose normal.  Neck: Neck supple.  Cardiovascular: Normal rate, regular rhythm and intact distal pulses.   Pulmonary/Chest: Effort normal and breath sounds normal. No respiratory distress. She has no wheezes. She has no rales.  Abdominal: Soft. Bowel sounds are normal. She exhibits no distension. There is no tenderness. There is no rebound.    Musculoskeletal: She exhibits no edema.  Neurological: She is alert.  Skin: Skin is warm and dry.    ED Course  Procedures (including critical care time) .  DIAGNOSTIC STUDIES: Oxygen Saturation is 100% on room air. Normal by my interpretation.    COORDINATION OF CARE: 54- Patient / Family / Caregiver informed of clinical course, understand medical decision-making process, and agree with plan.   Date: 12/16/2012  Rate: 84  Rhythm: normal sinus rhythm  QRS Axis: normal  Intervals: normal  ST/T Wave abnormalities: normal  Conduction Disutrbances: none  Narrative Interpretation:   Old EKG Reviewed: No significant changes noted    4:49 PM Pt re evaluated. Her labs and UA all back, normal other than chronic anemia, hgb 10. Instructed to restart iron. Pt started crying, stating she is very stressed out, anxious, she has children at home who are sick, her sister passed away last week, stats she is unable to sleep but only 2-4 hrs a night, not eating for two days.   Labs Reviewed - No data to display No results found.   1. Myalgia   2. Chest pain   3. Stress       MDM  Pt with body aches, chest pain, she is no distress. VS are normal. ECG normal. She is PERC negative. No risk factors for CAD. Labs and CXR unremarkable. Pt admitted to being under a lot of stress. She is not eating, not sleeping. I think this could be part of why she is feeling so bad. WIll try giving her some ativan, ambien just few tabs to help her sleep until she is able to get in with her PCP. Pt agrees with the plan.      I personally performed the services described in this documentation, which was scribed in my presence. The recorded information has been reviewed and is accurate.    Nicole Mussel, PA 12/16/12 [phone]

## 2012-12-16 NOTE — ED Provider Notes (Signed)
Medical screening examination/treatment/procedure(s) were performed by non-physician practitioner and as supervising physician I was immediately available for consultation/collaboration.   I did not personally evaluated the patient  Celene Kras, MD 12/16/12 2137

## 2013-01-06 ENCOUNTER — Emergency Department (HOSPITAL_COMMUNITY)
Admission: EM | Admit: 2013-01-06 | Discharge: 2013-01-07 | Disposition: A | Discharge status: Home / Self Care | Payer: BC Managed Care – PPO | Attending: Emergency Medicine | Admitting: Emergency Medicine

## 2013-01-06 ENCOUNTER — Encounter (HOSPITAL_COMMUNITY): Payer: Self-pay | Admitting: Adult Health

## 2013-01-06 DIAGNOSIS — J45909 Unspecified asthma, uncomplicated: Secondary | ICD-10-CM | POA: Insufficient documentation

## 2013-01-06 DIAGNOSIS — Z79899 Other long term (current) drug therapy: Secondary | ICD-10-CM | POA: Insufficient documentation

## 2013-01-06 DIAGNOSIS — Z8679 Personal history of other diseases of the circulatory system: Secondary | ICD-10-CM | POA: Insufficient documentation

## 2013-01-06 DIAGNOSIS — M545 Low back pain, unspecified: Secondary | ICD-10-CM | POA: Insufficient documentation

## 2013-01-06 DIAGNOSIS — R5383 Other fatigue: Secondary | ICD-10-CM | POA: Insufficient documentation

## 2013-01-06 DIAGNOSIS — F17200 Nicotine dependence, unspecified, uncomplicated: Secondary | ICD-10-CM | POA: Insufficient documentation

## 2013-01-06 DIAGNOSIS — R5381 Other malaise: Secondary | ICD-10-CM | POA: Insufficient documentation

## 2013-01-06 DIAGNOSIS — Z3202 Encounter for pregnancy test, result negative: Secondary | ICD-10-CM | POA: Insufficient documentation

## 2013-01-06 DIAGNOSIS — Z862 Personal history of diseases of the blood and blood-forming organs and certain disorders involving the immune mechanism: Secondary | ICD-10-CM | POA: Insufficient documentation

## 2013-01-06 DIAGNOSIS — G40909 Epilepsy, unspecified, not intractable, without status epilepticus: Secondary | ICD-10-CM | POA: Insufficient documentation

## 2013-01-06 LAB — CBC WITH DIFFERENTIAL/PLATELET
Basophils Absolute: 0.1 10*3/uL (ref 0.0–0.1)
Basophils Relative: 2 % — ABNORMAL HIGH (ref 0–1)
Eosinophils Absolute: 0.5 10*3/uL (ref 0.0–0.7)
Eosinophils Relative: 9 % — ABNORMAL HIGH (ref 0–5)
Lymphs Abs: 3 10*3/uL (ref 0.7–4.0)
MCH: 26.2 pg (ref 26.0–34.0)
MCV: 78.7 fL (ref 78.0–100.0)
Neutrophils Relative %: 35 % — ABNORMAL LOW (ref 43–77)
Platelets: 310 10*3/uL (ref 150–400)
RBC: 4.32 MIL/uL (ref 3.87–5.11)
RDW: 14.8 % (ref 11.5–15.5)
WBC: 6.1 10*3/uL (ref 4.0–10.5)

## 2013-01-06 LAB — BASIC METABOLIC PANEL
Calcium: 9 mg/dL (ref 8.4–10.5)
GFR calc Af Amer: >90 mL/min (ref >90)
GFR calc non Af Amer: >90 mL/min (ref >90)
Glucose, Bld: 104 mg/dL — ABNORMAL HIGH (ref 70–99)
Potassium: 3.7 mEq/L (ref 3.5–5.1)
Sodium: 131 mEq/L — ABNORMAL LOW (ref 135–145)

## 2013-01-06 LAB — POCT PREGNANCY, URINE: Preg Test, Ur: NEGATIVE

## 2013-01-06 NOTE — ED Notes (Addendum)
Presents with heavy menstrual bleeding for 2 weeks associated with weakness and fatigue. Reports using 4 pads a day and large clots. Also c/o back pain from c -spine to lumbar  For one and half weeks. 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  She denies pregnancy due to hx of tubal ligation.

## 2013-01-07 IMAGING — CR DG CHEST 2V
2 series · 2 of 2 positions shown · non-contrast
Comparison: 12/10/2011.

CLINICAL DATA: Shortness of breath.  Chest pain.

CHEST - 2 VIEW

[w chest pa]
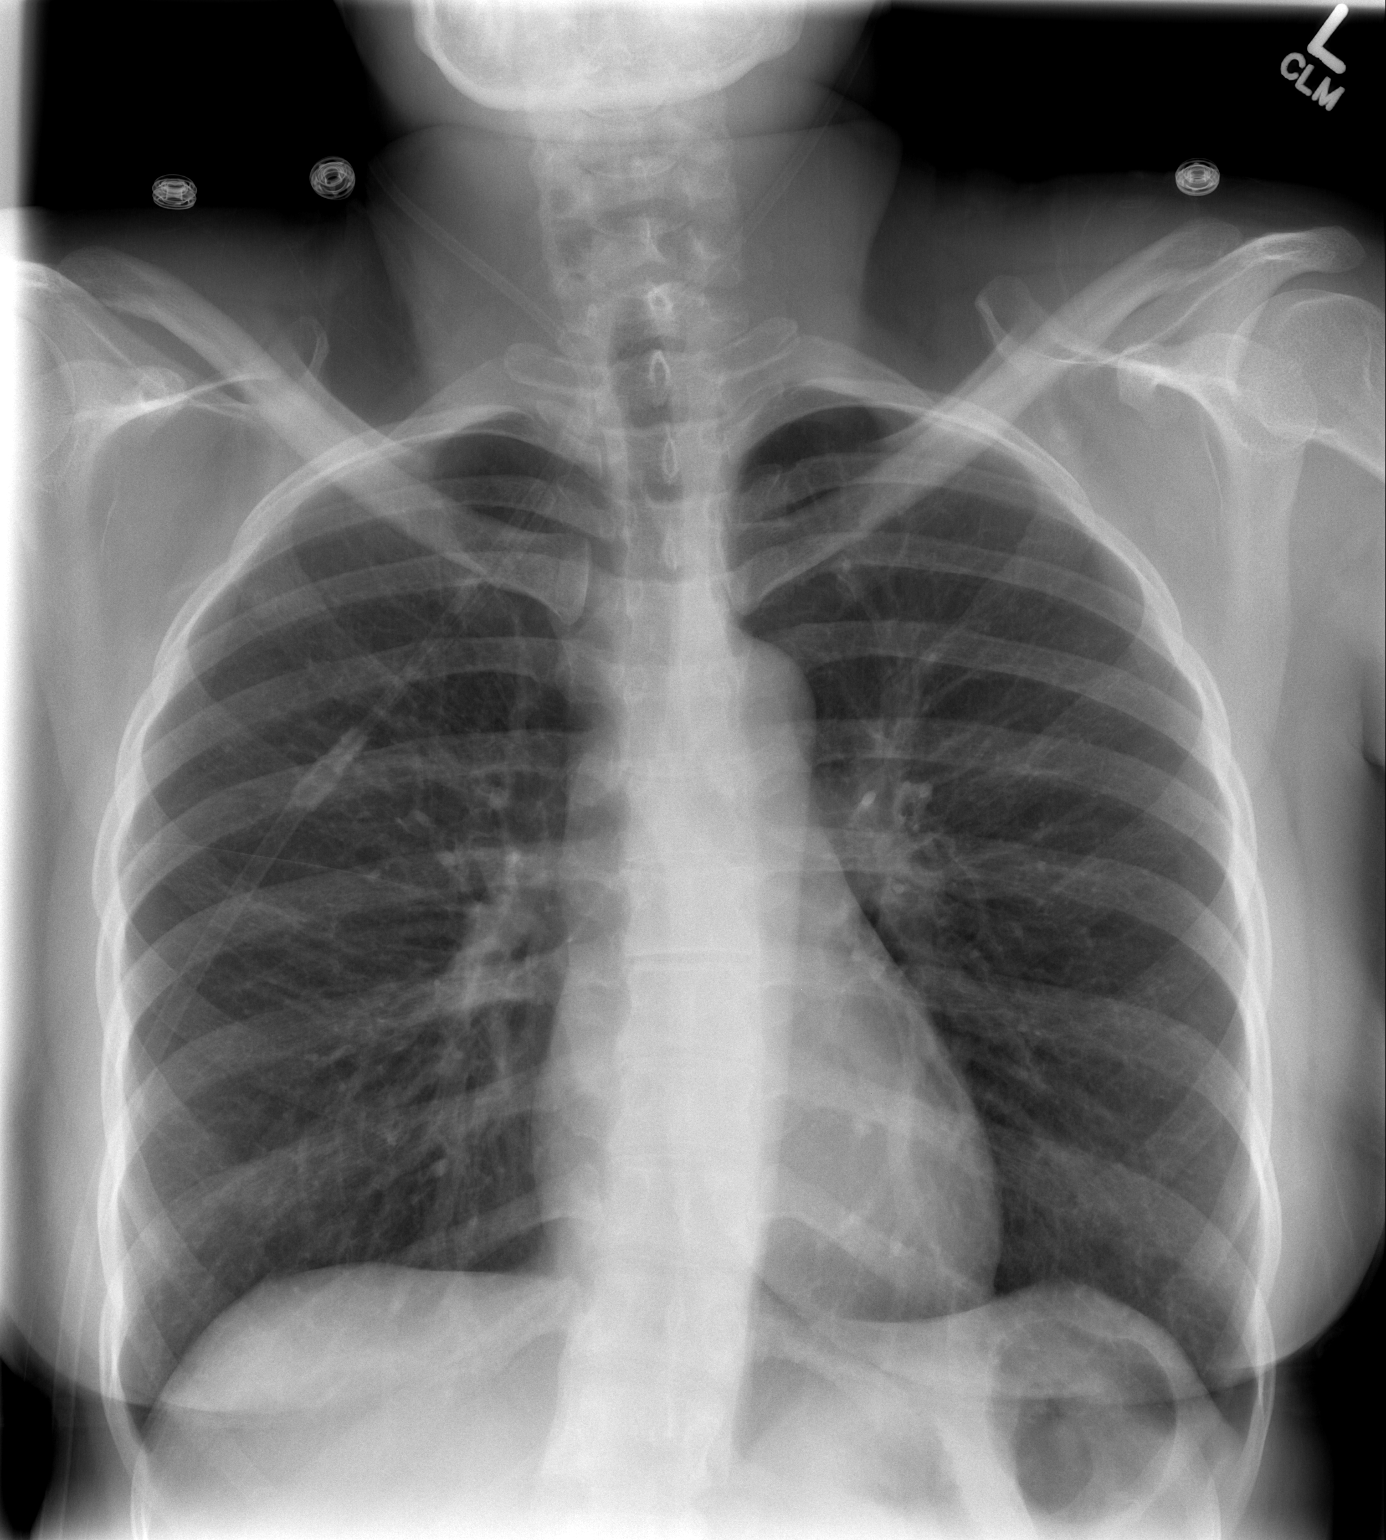

[w chest lat]
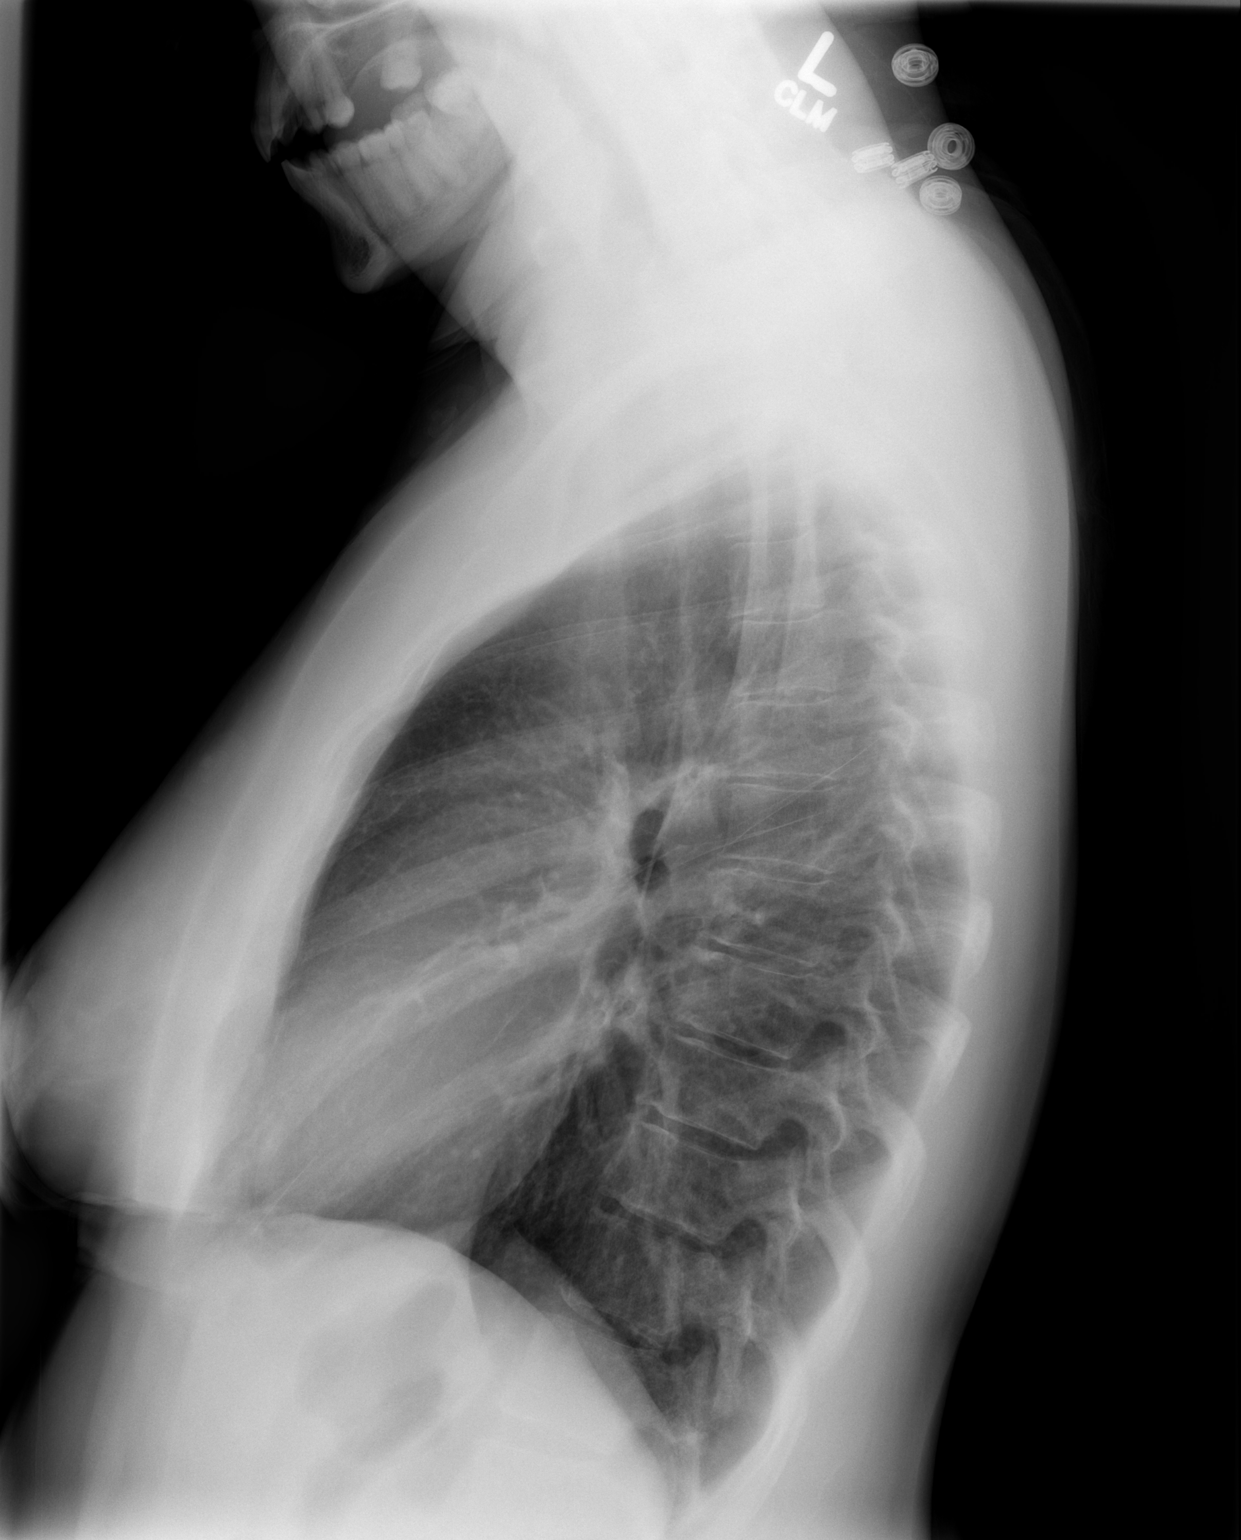

[2 of 2 positions shown; findings below may reference images not displayed]

FINDINGS: Normal sized heart.  Clear lungs with normal vascularity.
Stable mild scoliosis.
IMPRESSION: No acute abnormality.

## 2013-01-07 MED ORDER — HYDROMORPHONE HCL PF 1 MG/ML IJ SOLN
1.0000 mg | Freq: Once | INTRAMUSCULAR | Status: AC
  Administered 2013-01-07: 1 mg via INTRAMUSCULAR
  Filled 2013-01-07: qty 1

## 2013-01-07 MED ORDER — HYDROCODONE-ACETAMINOPHEN 5-325 MG PO TABS: 2.0000 | ORAL_TABLET | Freq: Four times a day (QID) | ORAL | Status: DC | PRN

## 2013-01-07 MED ORDER — IBUPROFEN 800 MG PO TABS
800.0000 mg | ORAL_TABLET | Freq: Once | ORAL | Status: AC
  Administered 2013-01-07: 800 mg via ORAL
  Filled 2013-01-07: qty 1

## 2013-01-07 MED ORDER — DIAZEPAM 5 MG PO TABS
5.0000 mg | ORAL_TABLET | Freq: Once | ORAL | Status: AC
  Administered 2013-01-07: 5 mg via ORAL
  Filled 2013-01-07: qty 1

## 2013-01-07 MED ORDER — IBUPROFEN 800 MG PO TABS: 800.0000 mg | ORAL_TABLET | Freq: Three times a day (TID) | ORAL | Status: DC

## 2013-01-07 MED ORDER — CYCLOBENZAPRINE HCL 10 MG PO TABS: 10.0000 mg | ORAL_TABLET | Freq: Two times a day (BID) | ORAL | Status: DC | PRN

## 2013-01-07 NOTE — ED Provider Notes (Signed)
History     CSN: [id number]  Arrival date & time 01/06/13  2204   First MD Initiated Contact with Patient 01/07/13 0030      Chief Complaint  Patient presents with  . Weakness  . Back Pain    (Consider location/radiation/quality/duration/timing/severity/associated sxs/prior treatment) HPI HX per PT, works in home health, does a lot of lifting, no known specific event but had onset of pain after working.  Pain located lower back across her back and radiates up her back, sharp pain, worse with bending. Onset about 10 days ago, no h/o same, no weakness or numbness, no incontinence.  No F/C, is not diabetic. Mod to severe pain.   Past Medical History  Diagnosis Date  . Anemia   . Asthma   . Blood transfusion 2011    r/t anemia  . Migraine   . Sickle cell trait   . Seizures     last sz Jan 2012  . Seizures     Past Surgical History  Procedure Date  . Tubal ligation 2005    Family History  Problem Relation Age of Onset  . Anesthesia problems Neg Hx     History  Substance Use Topics  . Smoking status: Current Some Day Smoker  . Smokeless tobacco: Not on file  . Alcohol Use: No    OB History    Grav Para Term Preterm Abortions TAB SAB Ect Mult Living   3 2 1 1 1  0 0 1 0 2      Review of Systems  Constitutional: Negative for fever and chills.  HENT: Negative for neck pain and neck stiffness.   Eyes: Negative for pain.  Respiratory: Negative for shortness of breath.   Cardiovascular: Negative for chest pain.  Gastrointestinal: Negative for abdominal pain.  Genitourinary: Negative for dysuria.  Musculoskeletal: Positive for back pain.  Skin: Negative for rash.  Neurological: Negative for headaches.  All other systems reviewed and are negative.    Allergies  Penicillins and Sulfa antibiotics  Home Medications   Current Outpatient Rx  Name  Route  Sig  Dispense  Refill  . ALBUTEROL SULFATE HFA 108 (90 BASE) MCG/ACT IN AERS   Inhalation   Inhale 2  puffs into the lungs every 6 (six) hours as needed. Shortness of breath          . CARBAMAZEPINE 200 MG PO TABS   Oral   Take 400 mg by mouth 2 (two) times daily.          Marland Kitchen LORAZEPAM 1 MG PO TABS   Oral   Take 1 mg by mouth 3 (three) times daily as needed. For anxiety         . PHENYTOIN SODIUM EXTENDED 100 MG PO CAPS   Oral   Take 100 mg by mouth 4 (four) times daily.          . TOPIRAMATE 50 MG PO TABS   Oral   Take 50 mg by mouth 2 (two) times daily.             BP 114/70  Pulse 85  Temp 97.8 F (36.6 C) (Oral)  Resp 16  SpO2 99%  Physical Exam  Constitutional: She is oriented to person, place, and time. She appears well-developed and well-nourished.  HENT:  Head: Normocephalic and atraumatic.  Eyes: EOM are normal. Pupils are equal, round, and reactive to light.  Neck: Neck supple.       No C spine tenderness or deformity  Cardiovascular: Regular rhythm and intact distal pulses.   Pulmonary/Chest: Effort normal. No respiratory distress.  Musculoskeletal: She exhibits no edema and no tenderness.       TTP over lumbar and paralumbar spine no deformity. No T spine tenderness. Dec ROM 2/2 pain, no LE deficits with equal DTrs, strengths and sensorium to light touch.   Neurological: She is alert and oriented to person, place, and time.  Skin: Skin is warm and dry.    ED Course  Procedures (including critical care time)  Results for orders placed during the hospital encounter of 01/06/13  CBC WITH DIFFERENTIAL      Component Value Range   WBC 6.1  4.0 - 10.5 K/uL   RBC 4.32  3.87 - 5.11 MIL/uL   Hemoglobin 11.3 (*) 12.0 - 15.0 g/dL   HCT 40.9 (*) 81.1 - 91.4 %   MCV 78.7  78.0 - 100.0 fL   MCH 26.2  26.0 - 34.0 pg   MCHC 33.2  30.0 - 36.0 g/dL   RDW 78.2  95.6 - 21.3 %   Platelets 310  150 - 400 K/uL   Neutrophils Relative 35 (*) 43 - 77 %   Neutro Abs 2.1  1.7 - 7.7 K/uL   Lymphocytes Relative 49 (*) 12 - 46 %   Lymphs Abs 3.0  0.7 - 4.0 K/uL    Monocytes Relative 6  3 - 12 %   Monocytes Absolute 0.4  0.1 - 1.0 K/uL   Eosinophils Relative 9 (*) 0 - 5 %   Eosinophils Absolute 0.5  0.0 - 0.7 K/uL   Basophils Relative 2 (*) 0 - 1 %   Basophils Absolute 0.1  0.0 - 0.1 K/uL  BASIC METABOLIC PANEL      Component Value Range   Sodium 131 (*) 135 - 145 mEq/L   Potassium 3.7  3.5 - 5.1 mEq/L   Chloride 95 (*) 96 - 112 mEq/L   CO2 26  19 - 32 mEq/L   Glucose, Bld 104 (*) 70 - 99 mg/dL   BUN 10  6 - 23 mg/dL   Creatinine, Ser 0.86  0.50 - 1.10 mg/dL   Calcium 9.0  8.4 - 57.8 mg/dL   GFR calc non Af Amer >90  >90 mL/min   GFR calc Af Amer >90  >90 mL/min  POCT PREGNANCY, URINE      Component Value Range   Preg Test, Ur NEGATIVE  NEGATIVE   IM dilaudid, ice, motrin and valium  1:52 AM recheck - pain improved. No indication for emergent MRI at this time. Plan discharge home with outpatient followup. Prescriptions provided. Back pain precautions verbalized as understood.  MDM   Low back pain treated with medications as above. Triage nurse sent labs and pregnancy test reviewed as above.  Vital Signs and nursing notes reviewed and considered.  Condition improved      Sunnie Nielsen, MD 01/07/13 [phone]

## 2013-02-16 ENCOUNTER — Emergency Department (HOSPITAL_COMMUNITY): Payer: BC Managed Care – PPO

## 2013-02-16 ENCOUNTER — Emergency Department (HOSPITAL_COMMUNITY)
Admission: EM | Admit: 2013-02-16 | Discharge: 2013-02-16 | Disposition: A | Discharge status: Home / Self Care | Payer: BC Managed Care – PPO | Attending: Emergency Medicine | Admitting: Emergency Medicine

## 2013-02-16 ENCOUNTER — Encounter (HOSPITAL_COMMUNITY): Payer: Self-pay | Admitting: Emergency Medicine

## 2013-02-16 DIAGNOSIS — G43909 Migraine, unspecified, not intractable, without status migrainosus: Secondary | ICD-10-CM | POA: Insufficient documentation

## 2013-02-16 DIAGNOSIS — R51 Headache: Secondary | ICD-10-CM | POA: Insufficient documentation

## 2013-02-16 DIAGNOSIS — R209 Unspecified disturbances of skin sensation: Secondary | ICD-10-CM | POA: Insufficient documentation

## 2013-02-16 DIAGNOSIS — H538 Other visual disturbances: Secondary | ICD-10-CM | POA: Insufficient documentation

## 2013-02-16 DIAGNOSIS — Z79899 Other long term (current) drug therapy: Secondary | ICD-10-CM | POA: Insufficient documentation

## 2013-02-16 DIAGNOSIS — R509 Fever, unspecified: Secondary | ICD-10-CM | POA: Insufficient documentation

## 2013-02-16 DIAGNOSIS — Z862 Personal history of diseases of the blood and blood-forming organs and certain disorders involving the immune mechanism: Secondary | ICD-10-CM | POA: Insufficient documentation

## 2013-02-16 DIAGNOSIS — G40909 Epilepsy, unspecified, not intractable, without status epilepticus: Secondary | ICD-10-CM | POA: Insufficient documentation

## 2013-02-16 DIAGNOSIS — J45909 Unspecified asthma, uncomplicated: Secondary | ICD-10-CM | POA: Insufficient documentation

## 2013-02-16 DIAGNOSIS — N898 Other specified noninflammatory disorders of vagina: Secondary | ICD-10-CM | POA: Insufficient documentation

## 2013-02-16 DIAGNOSIS — F17200 Nicotine dependence, unspecified, uncomplicated: Secondary | ICD-10-CM | POA: Insufficient documentation

## 2013-02-16 DIAGNOSIS — R110 Nausea: Secondary | ICD-10-CM | POA: Insufficient documentation

## 2013-02-16 DIAGNOSIS — R5381 Other malaise: Secondary | ICD-10-CM | POA: Insufficient documentation

## 2013-02-16 DIAGNOSIS — Z3202 Encounter for pregnancy test, result negative: Secondary | ICD-10-CM | POA: Insufficient documentation

## 2013-02-16 LAB — URINALYSIS, ROUTINE W REFLEX MICROSCOPIC
Bilirubin Urine: NEGATIVE
Glucose, UA: NEGATIVE mg/dL
Ketones, ur: NEGATIVE mg/dL
Leukocytes, UA: NEGATIVE
Nitrite: NEGATIVE
Specific Gravity, Urine: 1.03 (ref 1.005–1.030)
pH: 6 (ref 5.0–8.0)

## 2013-02-16 LAB — BASIC METABOLIC PANEL
BUN: 12 mg/dL (ref 6–23)
CO2: 22 mEq/L (ref 19–32)
Calcium: 8.4 mg/dL (ref 8.4–10.5)
GFR calc non Af Amer: >90 mL/min (ref >90)
Glucose, Bld: 78 mg/dL (ref 70–99)
Sodium: 134 mEq/L — ABNORMAL LOW (ref 135–145)

## 2013-02-16 LAB — CBC WITH DIFFERENTIAL/PLATELET
Eosinophils Absolute: 0.3 10*3/uL (ref 0.0–0.7)
Eosinophils Relative: 6 % — ABNORMAL HIGH (ref 0–5)
HCT: 32.8 % — ABNORMAL LOW (ref 36.0–46.0)
Hemoglobin: 10.8 g/dL — ABNORMAL LOW (ref 12.0–15.0)
Lymphocytes Relative: 50 % — ABNORMAL HIGH (ref 12–46)
Lymphs Abs: 2.7 10*3/uL (ref 0.7–4.0)
MCH: 25.9 pg — ABNORMAL LOW (ref 26.0–34.0)
MCV: 78.7 fL (ref 78.0–100.0)
Monocytes Absolute: 0.6 10*3/uL (ref 0.1–1.0)
Monocytes Relative: 11 % (ref 3–12)
Platelets: 354 10*3/uL (ref 150–400)
RBC: 4.17 MIL/uL (ref 3.87–5.11)
WBC: 5.4 10*3/uL (ref 4.0–10.5)

## 2013-02-16 LAB — URINE MICROSCOPIC-ADD ON

## 2013-02-16 LAB — GLUCOSE, CAPILLARY: Glucose-Capillary: 74 mg/dL (ref 70–99)

## 2013-02-16 LAB — PHENYTOIN LEVEL, TOTAL: Phenytoin Lvl: <2.5 ug/mL — ABNORMAL LOW (ref 10.0–20.0)

## 2013-02-16 MED ORDER — SODIUM CHLORIDE 0.9 % IV BOLUS (SEPSIS)
1000.0000 mL | Freq: Once | INTRAVENOUS | Status: AC
  Administered 2013-02-16: 1000 mL via INTRAVENOUS

## 2013-02-16 MED ORDER — PHENYTOIN SODIUM EXTENDED 100 MG PO CAPS: 100.0000 mg | ORAL_CAPSULE | Freq: Four times a day (QID) | ORAL | Status: DC

## 2013-02-16 MED ORDER — TOPIRAMATE 50 MG PO TABS: 50.0000 mg | ORAL_TABLET | Freq: Two times a day (BID) | ORAL | Status: DC

## 2013-02-16 MED ORDER — HYDROMORPHONE HCL PF 1 MG/ML IJ SOLN
1.0000 mg | Freq: Once | INTRAMUSCULAR | Status: AC
  Administered 2013-02-16: 1 mg via INTRAVENOUS
  Filled 2013-02-16: qty 1

## 2013-02-16 MED ORDER — CARBAMAZEPINE 200 MG PO TABS: 400.0000 mg | ORAL_TABLET | Freq: Two times a day (BID) | ORAL | Status: DC

## 2013-02-16 MED ORDER — ONDANSETRON HCL 4 MG/2ML IJ SOLN
4.0000 mg | INTRAMUSCULAR | Status: AC
  Administered 2013-02-16: 4 mg via INTRAVENOUS
  Filled 2013-02-16: qty 2

## 2013-02-16 MED ORDER — SODIUM CHLORIDE 0.9 % IV SOLN
1500.0000 mg | Freq: Once | INTRAVENOUS | Status: AC
  Administered 2013-02-16: 1500 mg via INTRAVENOUS
  Filled 2013-02-16: qty 30

## 2013-02-16 MED ORDER — HYDROMORPHONE HCL PF 1 MG/ML IJ SOLN
0.5000 mg | Freq: Once | INTRAMUSCULAR | Status: AC
  Administered 2013-02-16: 0.5 mg via INTRAVENOUS
  Filled 2013-02-16: qty 1

## 2013-02-16 NOTE — ED Notes (Addendum)
Pt reports she has hx of seizures, has not taken tegretol in 5 months due to difficulties with insurance. Reported 3 seizures today. Pt has vaginal bleeding. 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 Pt reports generalized pain 8/10. Pt talked to pcp and due to seizures pcp recommended pt come to ED.  Pt is answering questions slowly  cbg 74

## 2013-02-16 NOTE — ED Notes (Signed)
Pt states that she has had vaginal bleeding, headache, and leg pain x 1 week.  Person with pt states that she had 3 seizures on the way here.  When asked what they looked like, he said "they look like she got the hiccups".

## 2013-02-16 NOTE — ED Provider Notes (Signed)
History     CSN: [id number]  Arrival date & time 02/16/13  1227   First MD Initiated Contact with Patient 02/16/13 1429      Chief Complaint  Patient presents with  . Seizures  . Abdominal Pain  . Vaginal Bleeding    (Consider location/radiation/quality/duration/timing/severity/associated sxs/prior treatment) HPI Comments: Patient with hx of seizures presents for 3 seizures this AM. Patient's fiance states that she was lying in bed when the seizures occurred; each lasting a few minutes with approximately 20 minutes in between. Fiance states the first seizure appeared as though the patient was "having the hiccups". This progressed with to tonic clonic type movements with the second and third. Fiance admits to a post ictal phase of 30 minutes after the third seizure. Fiance denies head trauma and any bowel or bladder incontinence with seizure activity. Patient complaining of b/l temporal throbbing headache at this time and the sensation of "pins and needles" down her L arm and b/l legs. Patient denies starting or discontinuing any medications recently as well as illicit drug or alcohol use. Has been off her Tegretol and Topamax for 5 months due to a lapse in insurance coverage. Had not seen her neurologist in over a year. On later questioning, boyfriend states patient had fever of 101 yesterday. Patient afebrile in triage.  As an aside patient complains of generalized abdominal pain with heavy vaginal bleeding. Patient is being followed by her GYN for this complaint and has been found to have precancerous cervical cells.  Patient is a 37 y.o. female presenting with seizures, abdominal pain, and vaginal bleeding. The history is provided by the patient, the spouse and a relative. No language interpreter was used.  Seizures Abdominal Pain Associated symptoms: fever, nausea and vaginal bleeding   Associated symptoms: no chest pain, no dysuria, no hematuria and no shortness of breath   Vaginal  Bleeding Associated symptoms include abdominal pain, a fever, nausea and weakness. Pertinent negatives include no chest pain or rash.    Past Medical History  Diagnosis Date  . Anemia   . Asthma   . Blood transfusion 2011    r/t anemia  . Migraine   . Sickle cell trait   . Seizures     last sz Jan 2012  . Seizures     Past Surgical History  Procedure Laterality Date  . Tubal ligation  2005    Family History  Problem Relation Age of Onset  . Anesthesia problems Neg Hx     History  Substance Use Topics  . Smoking status: Current Some Day Smoker  . Smokeless tobacco: Not on file  . Alcohol Use: No    OB History   Grav Para Term Preterm Abortions TAB SAB Ect Mult Living   3 2 1 1 1  0 0 1 0 2      Review of Systems  Constitutional: Positive for fever.  Eyes:       + "foggy" vision  Respiratory: Negative for chest tightness and shortness of breath.   Cardiovascular: Negative for chest pain.  Gastrointestinal: Positive for nausea and abdominal pain.  Genitourinary: Positive for vaginal bleeding. Negative for dysuria and hematuria.  Musculoskeletal: Negative for back pain.  Skin: Negative for rash.  Neurological: Positive for seizures and weakness.       +tingling  All other systems reviewed and are negative.    Allergies  Penicillins and Sulfa antibiotics  Home Medications   Current Outpatient Rx  Name  Route  Sig  Dispense  Refill  . albuterol (PROVENTIL HFA;VENTOLIN HFA) 108 (90 BASE) MCG/ACT inhaler   Inhalation   Inhale 2 puffs into the lungs every 6 (six) hours as needed. Shortness of breath          . Antipyrine-Benzocaine (AURALGAN) 54-14 MG/ML SOLN   Right Ear   Place 2 drops into the right ear every 2 (two) hours as needed for pain.         . benzonatate (TESSALON) 100 MG capsule   Oral   Take 100 mg by mouth 3 (three) times daily as needed for cough.         . cyclobenzaprine (FLEXERIL) 10 MG tablet   Oral   Take 1 tablet (10 mg  total) by mouth 2 (two) times daily as needed for muscle spasms.   20 tablet   0   . doxycycline (VIBRAMYCIN) 100 MG capsule   Oral   Take 100 mg by mouth 2 (two) times daily. For 7 days started on 02-13-13         . HYDROcodone-acetaminophen (NORCO/VICODIN) 5-325 MG per tablet   Oral   Take 2 tablets by mouth every 6 (six) hours as needed for pain (No driving or working while medicated).   15 tablet   0   . ibuprofen (ADVIL,MOTRIN) 800 MG tablet   Oral   Take 1 tablet (800 mg total) by mouth 3 (three) times daily.   21 tablet   0   . ketorolac (TORADOL) 60 MG/2ML SOLN injection   Intravenous   Inject 60 mg into the vein every 6 (six) hours as needed for pain (pain).         . LORazepam (ATIVAN) 1 MG tablet   Oral   Take 1 mg by mouth 3 (three) times daily as needed. For anxiety         . topiramate (TOPAMAX) 50 MG tablet   Oral   Take 50 mg by mouth 2 (two) times daily.           . traMADol (ULTRAM) 50 MG tablet   Oral   Take 50 mg by mouth every 6 (six) hours as needed for pain.         . carbamazepine (TEGRETOL) 200 MG tablet   Oral   Take 400 mg by mouth 2 (two) times daily.          . carbamazepine (TEGRETOL) 200 MG tablet   Oral   Take 2 tablets (400 mg total) by mouth 2 (two) times daily.   60 tablet   0   . phenytoin (DILANTIN) 100 MG ER capsule   Oral   Take 100 mg by mouth 4 (four) times daily.          . phenytoin (DILANTIN) 100 MG ER capsule   Oral   Take 1 capsule (100 mg total) by mouth QID.   120 capsule   0   . topiramate (TOPAMAX) 50 MG tablet   Oral   Take 1 tablet (50 mg total) by mouth 2 (two) times daily.   30 tablet   0     BP 111/76  Pulse 68  Temp(Src) 98.8 F (37.1 C) (Rectal)  Resp 17  SpO2 100%  LMP 02/10/2013  Physical Exam  Nursing note and vitals reviewed. Constitutional: She is oriented to person, place, and time. She appears well-developed and well-nourished. No distress.  HENT:  Head:  Normocephalic and atraumatic.  Mouth/Throat: Oropharynx is clear and moist. No  oropharyngeal exudate.  Eyes: Conjunctivae are normal. Pupils are equal, round, and reactive to light. Right eye exhibits no discharge. Left eye exhibits no discharge. No scleral icterus.  Neck: Normal range of motion. Neck supple.  Cardiovascular: Normal rate, regular rhythm, normal heart sounds and intact distal pulses.   Pulmonary/Chest: Effort normal and breath sounds normal. No respiratory distress. She has no wheezes. She has no rales.  Abdominal: Soft. Bowel sounds are normal. She exhibits no distension and no mass. There is generalized tenderness. There is no rebound, no guarding, no CVA tenderness, no tenderness at McBurney's point and negative Murphy's sign.  Musculoskeletal: Normal range of motion. She exhibits no edema.  Lymphadenopathy:    She has no cervical adenopathy.  Neurological: She is alert and oriented to person, place, and time. She has normal strength. She displays no tremor. No cranial nerve deficit or sensory deficit. She exhibits normal muscle tone. GCS eye subscore is 4. GCS verbal subscore is 5. GCS motor subscore is 6.  Extremely poor effort on neuro exam. Patient follows simple commands and speaks in full sentences without dysphagia.  Skin: Skin is warm and dry. She is not diaphoretic. No erythema.  Psychiatric: She has a normal mood and affect. Her behavior is normal.    ED Course  Procedures (including critical care time)  Labs Reviewed  CBC WITH DIFFERENTIAL - Abnormal; Notable for the following:    Hemoglobin 10.8 (*)    HCT 32.8 (*)    MCH 25.9 (*)    Neutrophils Relative 32 (*)    Lymphocytes Relative 50 (*)    Eosinophils Relative 6 (*)    All other components within normal limits  BASIC METABOLIC PANEL - Abnormal; Notable for the following:    Sodium 134 (*)    All other components within normal limits  CARBAMAZEPINE LEVEL, TOTAL - Abnormal; Notable for the following:     Carbamazepine Lvl <0.5 (*)    All other components within normal limits  URINALYSIS, ROUTINE W REFLEX MICROSCOPIC - Abnormal; Notable for the following:    APPearance CLOUDY (*)    Hgb urine dipstick LARGE (*)    Protein, ur 30 (*)    All other components within normal limits  PHENYTOIN LEVEL, TOTAL - Abnormal; Notable for the following:    Phenytoin Lvl <2.5 (*)    All other components within normal limits  GLUCOSE, CAPILLARY  URINE MICROSCOPIC-ADD ON  POCT PREGNANCY, URINE   Ct Head Wo Contrast  02/16/2013  *RADIOLOGY REPORT*  Clinical Data: Seizures.  History seizures.  Lightheadedness.  CT HEAD WITHOUT CONTRAST  Technique:  Contiguous axial images were obtained from the base of the skull through the vertex without contrast.  Comparison: 08/09/2012  Findings: Bone windows demonstrate mucosal thickening of and fluid within the left maxillary sinus, new.  Ethmoid air cell mucosal thickening is slightly improved.  New sphenoid sinus mucosal thickening.  Hypoplastic right frontal sinus with minimal fluid within on image 9/series 3.  Soft tissue windows demonstrate no  mass lesion, hemorrhage, hydrocephalus, acute infarct, intra-axial, or extra-axial fluid collection.  IMPRESSION:  1. No acute intracranial abnormality. 2.  Sinus disease, slightly increased since 08/09/2012. 3.  Trace fluid in the right mastoid air cells, which appear hypoplastic.   Original Report Authenticated By: Jeronimo Greaves, M.D.      1. Seizure disorder   2. Headache      MDM  Patient presents to ED after having 3 seizures this AM. Patient has hx of seizures,  but has been off her medication x 5 months due to lapse in insurance coverage. Last neurology follow up was 1 year ago. Work up to include CT head without contrast, labs, CGB, UA, urine preg, carbamazepine level and phenytoin level. Patient's vitals stable, she is in NAD, and A&Ox4.  Labs c/w prior work ups and CT negative for acute abnormalities. Patient states  headache has greatly improved and that she feels back at baseline. Patient vitals have remained stable and she is in NAD; sitting comfortably in bed, talking and laughing on cell phone. Have ordered dilantin load at 20mg /kg. Upon completion, will d/c patient with neurology follow up and restart the patient back on her seizure medication; seizure medication and dosing confirmed with patient. Patient states comfort and understanding with this d/c plan. Patient seen by Dr. Manus Gunning with whom this patient's work up and management was discussed.  Filed Vitals:   02/16/13 1645 02/16/13 1730 02/16/13 1830 02/16/13 2116  BP:  111/72 111/76 115/72  Pulse: 64 72 68 92  Temp: 98.8 F (37.1 C)   98 F (36.7 C)  TempSrc: Rectal   Oral  Resp:  16 17 16   SpO2: 100% 100% 100% 99%           Antony Madura, PA-C 02/16/13 2227

## 2013-02-16 NOTE — ED Notes (Signed)
PA at bedside Pt alert and oriented x4. Respirations even and unlabored, bilateral symmetrical rise and fall of chest. Skin warm and dry. In no acute distress. Denies needs.   

## 2013-02-16 NOTE — ED Notes (Signed)
Pt to CT  rn unsuccessful IV attempt. Charge rn will attempt

## 2013-02-17 NOTE — ED Provider Notes (Signed)
Medical screening examination/treatment/procedure(s) were conducted as a shared visit with non-physician practitioner(s) and myself.  I personally evaluated the patient during the encounter  Seizure activity with known seizure disorder and noncompliance with medications. Post ictal period resolving. No tongue biting or incontinence. No meningismus. Poor effort on neuro exam and follows commands moves all extremities equally. Dilantin load, restart antiepileptics, await return to baseline.  Glynn Octave, MD 02/17/13 [id number]

## 2013-02-20 ENCOUNTER — Inpatient Hospital Stay (HOSPITAL_COMMUNITY): Payer: BC Managed Care – PPO

## 2013-02-20 ENCOUNTER — Encounter (HOSPITAL_COMMUNITY): Payer: Self-pay | Admitting: *Deleted

## 2013-02-20 ENCOUNTER — Inpatient Hospital Stay (HOSPITAL_COMMUNITY)
Admission: AD | Admit: 2013-02-20 | Discharge: 2013-02-20 | Disposition: A | Discharge status: Home / Self Care | Payer: BC Managed Care – PPO | Source: Ambulatory Visit | Attending: Obstetrics | Admitting: Obstetrics

## 2013-02-20 DIAGNOSIS — N938 Other specified abnormal uterine and vaginal bleeding: Secondary | ICD-10-CM | POA: Insufficient documentation

## 2013-02-20 DIAGNOSIS — R109 Unspecified abdominal pain: Secondary | ICD-10-CM | POA: Insufficient documentation

## 2013-02-20 DIAGNOSIS — N83209 Unspecified ovarian cyst, unspecified side: Secondary | ICD-10-CM | POA: Insufficient documentation

## 2013-02-20 DIAGNOSIS — D259 Leiomyoma of uterus, unspecified: Secondary | ICD-10-CM

## 2013-02-20 DIAGNOSIS — N949 Unspecified condition associated with female genital organs and menstrual cycle: Secondary | ICD-10-CM | POA: Insufficient documentation

## 2013-02-20 HISTORY — DX: Benign neoplasm of connective and other soft tissue, unspecified: D21.9

## 2013-02-20 LAB — CBC
HCT: 35.5 % — ABNORMAL LOW (ref 36.0–46.0)
MCHC: 32.7 g/dL (ref 30.0–36.0)
MCV: 78.2 fL (ref 78.0–100.0)
RDW: 15.5 % (ref 11.5–15.5)

## 2013-02-20 MED ORDER — IBUPROFEN 600 MG PO TABS: 600.0000 mg | ORAL_TABLET | Freq: Four times a day (QID) | ORAL | Status: DC | PRN

## 2013-02-20 MED ORDER — KETOROLAC TROMETHAMINE 30 MG/ML IJ SOLN
30.0000 mg | INTRAMUSCULAR | Status: AC
Start: 2013-02-20 — End: 2013-02-20
  Administered 2013-02-20: 30 mg via INTRAVENOUS
  Filled 2013-02-20: qty 1

## 2013-02-20 MED ORDER — MEGESTROL ACETATE 40 MG PO TABS: ORAL_TABLET | ORAL | Status: DC

## 2013-02-20 NOTE — MAU Note (Signed)
Been bleeding for past 2 wks, started at time expected period.  Has been heavier, changing hourly.

## 2013-02-20 NOTE — MAU Note (Signed)
Name and DOB verified, pt confirmed spelling is correct on arm band. 

## 2013-02-20 NOTE — MAU Provider Note (Signed)
History     CSN: [id number]  Arrival date and time: 02/20/13 1126   First Provider Initiated Contact with Patient 02/20/13 1306      Chief Complaint  Patient presents with  . Vaginal Bleeding   HPI Pt is a 37 y/o [id number] female with a history of uterine fibroids who presents today for abnormal vaginal bleeding and abdominal pain. She was seen year March 2013 for abdominal pain and vaginal bleeding and was found to have uterine fibroids on U/S. She has followed up with Dr. Clearance Coots, who has given her DepoProvera to stop her bleeding, but she missed her last injection. She has an appointment with Dr. Clearance Coots on 02/24/13, but in the meantime she reports that her bleeding has increased over the past 5 days and she is hoping we can give her a DepoProvera shot today. She endorses lightheadedness and feels as though she may pass out, to the point where she had her daughter drive her here today because she is nervous about driving herself. She is currently taking iron supplements.   Past Medical History  Diagnosis Date  . Anemia   . Asthma   . Blood transfusion 2011    r/t anemia  . Migraine   . Sickle cell trait   . Seizures     last sz Jan 2012  . Seizures   . Fibroid     Past Surgical History  Procedure Laterality Date  . Tubal ligation  2005    Family History  Problem Relation Age of Onset  . Anesthesia problems Neg Hx   . Hypertension Mother   . Diabetes Mother   . Cancer Mother   . Diabetes Father   . Diabetes Maternal Grandmother     History  Substance Use Topics  . Smoking status: Former Games developer  . Smokeless tobacco: Not on file  . Alcohol Use: No    Allergies:  Allergies  Allergen Reactions  . Penicillins Swelling  . Sulfa Antibiotics Rash    Prescriptions prior to admission  Medication Sig Dispense Refill  . carbamazepine (TEGRETOL) 200 MG tablet Take 400 mg by mouth 2 (two) times daily.       Marland Kitchen doxycycline (VIBRAMYCIN) 100 MG capsule Take 100 mg by mouth  2 (two) times daily. For 7 days started on 02-13-13      . ketorolac (TORADOL) 60 MG/2ML SOLN injection Inject 60 mg into the vein every 6 (six) hours as needed for pain (pain).      . phenytoin (DILANTIN) 100 MG ER capsule Take 100 mg by mouth 4 (four) times daily.       Marland Kitchen topiramate (TOPAMAX) 50 MG tablet Take 50 mg by mouth 2 (two) times daily.        . traMADol (ULTRAM) 50 MG tablet Take 50 mg by mouth every 6 (six) hours as needed for pain.      Marland Kitchen albuterol (PROVENTIL HFA;VENTOLIN HFA) 108 (90 BASE) MCG/ACT inhaler Inhale 2 puffs into the lungs every 6 (six) hours as needed. Shortness of breath        Results for orders placed during the hospital encounter of 02/20/13 (from the past 24 hour(s))  CBC     Status: Abnormal (Preliminary result)   Collection Time    02/20/13  1:58 PM      Result Value Range   WBC PENDING  4.0 - 10.5 K/uL   RBC 4.54  3.87 - 5.11 MIL/uL   Hemoglobin 11.6 (*) 12.0 - 15.0 g/dL  HCT 35.5 (*) 36.0 - 46.0 %   MCV 78.2  78.0 - 100.0 fL   MCH 25.6 (*) 26.0 - 34.0 pg   MCHC 32.7  30.0 - 36.0 g/dL   RDW 82.9  56.2 - 13.0 %   Platelets PENDING  150 - 400 K/uL    ROS A 14-point review of systems was asked and positive pertinent and negatives are discussed in the HPI.  Physical Exam   Blood pressure 100/67, pulse 87, temperature 98.1 F (36.7 C), temperature source Oral, resp. rate 16, height 5\' 5"  (1.651 m), weight 93.441 kg (206 lb), last menstrual period 02/10/2013.  Physical Exam  Constitutional: She appears well-developed and well-nourished. She appears distressed (mildly distressed).  Cardiovascular: Normal rate and regular rhythm.   Genitourinary: There is no injury on the right labia. There is no injury on the left labia. Cervix exhibits no discharge and no friability. Right adnexum displays no mass, no tenderness and no fullness. Left adnexum displays tenderness. Left adnexum displays no mass and no fullness. There is bleeding (blood present in the  vaginal vault) around the vagina. No signs of injury around the vagina. No vaginal discharge found.  Skin: She is not diaphoretic.   Results for orders placed during the hospital encounter of 02/20/13 (from the past 24 hour(s))  CBC     Status: Abnormal   Collection Time    02/20/13  1:58 PM      Result Value Range   WBC 5.7  4.0 - 10.5 K/uL   RBC 4.54  3.87 - 5.11 MIL/uL   Hemoglobin 11.6 (*) 12.0 - 15.0 g/dL   HCT 86.5 (*) 78.4 - 69.6 %   MCV 78.2  78.0 - 100.0 fL   MCH 25.6 (*) 26.0 - 34.0 pg   MCHC 32.7  30.0 - 36.0 g/dL   RDW 29.5  28.4 - 13.2 %   Platelets REPEATED TO VERIFY  150 - 400 K/uL   Ultrasound:  IMPRESSION:  1. Multiple small uterine fibroids, which appeared mildly  increased since previous study. One fibroid in the right fundal  region has a submucosal component.  2. 3.6 cm simple cyst or follicle in the right ovary with benign  features. No further imaging followup required in a a reproductive  age female. This recommendation follows the consensus statement:  Management of Asymptomatic Ovarian and Other Adnexal Cysts Imaged  at [place]: Society of Radiologists in Ultrasound Consensus Conference  Statement. Radiology 2010; [phone].  3. Normal appearance of left ovary.  MAU Course  Procedures  Assessment and Plan  1. Dysfunctional uterine bleeding - U/S shows mildly increased fibroids, likely explaining her bleeding. Benign ovarian cyst on U/S as well, likely explaining her R adnexal pain - Pain control with Toradol injection in MAU - Megace taper for bleeding reduction to go home - Ibuprofen 600 mg Q6 hrs for x4 days pain relief as well as bleeding reduction to go home  Lorna Dibble, PA-S 02/20/2013, 2:37 PM   I have seen this patient and agree with the above PA student's note.  Pt to keep appointment with Dr Clearance Coots on Monday.  LEFTWICH-KIRBY, LISA Certified Nurse-Midwife

## 2013-03-07 ENCOUNTER — Encounter: Payer: Self-pay | Admitting: *Deleted

## 2013-03-07 ENCOUNTER — Ambulatory Visit (INDEPENDENT_AMBULATORY_CARE_PROVIDER_SITE_OTHER): Payer: BC Managed Care – HMO | Admitting: *Deleted

## 2013-03-07 VITALS — BP 100/70 | HR 71 | Temp 97.3°F | Wt 209.0 lb

## 2013-03-07 DIAGNOSIS — Z3049 Encounter for surveillance of other contraceptives: Secondary | ICD-10-CM

## 2013-03-07 MED ORDER — MEDROXYPROGESTERONE ACETATE 150 MG/ML IM SUSP
150.0000 mg | Freq: Once | INTRAMUSCULAR | Status: AC
  Administered 2013-03-07: 150 mg via INTRAMUSCULAR

## 2013-03-07 NOTE — Progress Notes (Signed)
Injection given in left deltoid IM. Pt tolerated well w/o complaints. Pt is to return to office approximately May 29, 2013. Pt expressed understanding.

## 2013-03-07 NOTE — Patient Instructions (Signed)
Please return to office as directed and as needed.

## 2013-03-10 ENCOUNTER — Ambulatory Visit: Payer: Self-pay

## 2013-03-19 ENCOUNTER — Emergency Department (HOSPITAL_COMMUNITY)
Admission: EM | Admit: 2013-03-19 | Discharge: 2013-03-19 | Disposition: A | Discharge status: Home / Self Care | Payer: BC Managed Care – PPO | Attending: Emergency Medicine | Admitting: Emergency Medicine

## 2013-03-19 ENCOUNTER — Encounter (HOSPITAL_COMMUNITY): Payer: Self-pay | Admitting: Family Medicine

## 2013-03-19 DIAGNOSIS — Z862 Personal history of diseases of the blood and blood-forming organs and certain disorders involving the immune mechanism: Secondary | ICD-10-CM | POA: Insufficient documentation

## 2013-03-19 DIAGNOSIS — J45909 Unspecified asthma, uncomplicated: Secondary | ICD-10-CM | POA: Insufficient documentation

## 2013-03-19 DIAGNOSIS — M545 Low back pain, unspecified: Secondary | ICD-10-CM | POA: Insufficient documentation

## 2013-03-19 DIAGNOSIS — G8929 Other chronic pain: Secondary | ICD-10-CM

## 2013-03-19 DIAGNOSIS — D649 Anemia, unspecified: Secondary | ICD-10-CM | POA: Insufficient documentation

## 2013-03-19 DIAGNOSIS — Z8742 Personal history of other diseases of the female genital tract: Secondary | ICD-10-CM | POA: Insufficient documentation

## 2013-03-19 DIAGNOSIS — Z8669 Personal history of other diseases of the nervous system and sense organs: Secondary | ICD-10-CM | POA: Insufficient documentation

## 2013-03-19 DIAGNOSIS — Z8679 Personal history of other diseases of the circulatory system: Secondary | ICD-10-CM | POA: Insufficient documentation

## 2013-03-19 DIAGNOSIS — Z87891 Personal history of nicotine dependence: Secondary | ICD-10-CM | POA: Insufficient documentation

## 2013-03-19 DIAGNOSIS — M549 Dorsalgia, unspecified: Secondary | ICD-10-CM

## 2013-03-19 DIAGNOSIS — M79609 Pain in unspecified limb: Secondary | ICD-10-CM | POA: Insufficient documentation

## 2013-03-19 DIAGNOSIS — Z79899 Other long term (current) drug therapy: Secondary | ICD-10-CM | POA: Insufficient documentation

## 2013-03-19 MED ORDER — OXYCODONE-ACETAMINOPHEN 5-325 MG PO TABS
2.0000 | ORAL_TABLET | Freq: Once | ORAL | Status: AC
  Administered 2013-03-19: 2 via ORAL
  Filled 2013-03-19: qty 2

## 2013-03-19 MED ORDER — IBUPROFEN 800 MG PO TABS: 800.0000 mg | ORAL_TABLET | Freq: Three times a day (TID) | ORAL | Status: DC

## 2013-03-19 NOTE — ED Provider Notes (Signed)
History     CSN: [id number]  Arrival date & time 03/19/13  0905   First MD Initiated Contact with Patient 03/19/13 1026      Chief Complaint  Patient presents with  . Back Pain  . Leg Pain    (Consider location/radiation/quality/duration/timing/severity/associated sxs/prior treatment) Patient is a 37 y.o. female presenting with back pain and leg pain. The history is provided by the patient.  Back Pain Location:  Lumbar spine Quality:  Shooting Radiates to:  R posterior upper leg Pain is:  Worse during the night Onset quality:  Gradual Timing:  Constant Progression:  Waxing and waning Context: lifting heavy objects and occupational injury   Context comment:  Pt is a CNA, lifts patients and equipment regularly. Has had lowe back pain for some time.  Relieved by:  None tried Worsened by:  Movement Associated symptoms: leg pain   Associated symptoms: no abdominal pain, no bladder incontinence, no bowel incontinence, no dysuria, no fever, no numbness, no pelvic pain and no weakness   Risk factors: no hx of cancer and no recent surgery   Leg Pain Associated symptoms: back pain   Associated symptoms: no fever     Past Medical History  Diagnosis Date  . Anemia   . Asthma   . Blood transfusion 2011    r/t anemia  . Migraine   . Sickle cell trait   . Seizures     last sz Jan 2012  . Seizures   . Fibroid     Past Surgical History  Procedure Laterality Date  . Tubal ligation Bilateral 2005    Family History  Problem Relation Age of Onset  . Anesthesia problems Neg Hx   . Hypertension Mother   . Diabetes Mother   . Cancer Mother   . Diabetes Father   . Diabetes Maternal Grandmother     History  Substance Use Topics  . Smoking status: Former Games developer  . Smokeless tobacco: Not on file  . Alcohol Use: No    OB History   Grav Para Term Preterm Abortions TAB SAB Ect Mult Living   3 2 1 1 1  0 0 1 0 2      Review of Systems  Constitutional: Negative for fever  and chills.  Gastrointestinal: Negative for nausea, vomiting, abdominal pain, diarrhea, constipation, blood in stool and bowel incontinence.  Genitourinary: Negative for bladder incontinence, dysuria, frequency and pelvic pain.  Musculoskeletal: Positive for back pain. Negative for gait problem.       Lower back  Neurological: Negative for weakness and numbness.  Psychiatric/Behavioral: The patient is nervous/anxious.     Allergies  Penicillins and Sulfa antibiotics  Home Medications   Current Outpatient Rx  Name  Route  Sig  Dispense  Refill  . carbamazepine (TEGRETOL) 200 MG tablet   Oral   Take 100 mg by mouth 2 (two) times daily.          . diclofenac (VOLTAREN) 75 MG EC tablet   Oral   Take 75 mg by mouth 2 (two) times daily with a meal.         . ibuprofen (ADVIL,MOTRIN) 600 MG tablet   Oral   Take 1 tablet (600 mg total) by mouth every 6 (six) hours as needed for pain.   30 tablet   1   . Iron-FA-B Cmp-C-Biot-Probiotic (FUSION PLUS PO)   Oral   Take 1 tablet by mouth daily with breakfast.         .  phenytoin (DILANTIN) 100 MG ER capsule   Oral   Take 100 mg by mouth 4 (four) times daily.          Marland Kitchen topiramate (TOPAMAX) 50 MG tablet   Oral   Take 50 mg by mouth 2 (two) times daily.           . traMADol (ULTRAM) 50 MG tablet   Oral   Take 50 mg by mouth every 6 (six) hours as needed for pain.         Marland Kitchen albuterol (PROVENTIL HFA;VENTOLIN HFA) 108 (90 BASE) MCG/ACT inhaler   Inhalation   Inhale 2 puffs into the lungs every 6 (six) hours as needed. Shortness of breath          . ibuprofen (ADVIL,MOTRIN) 800 MG tablet   Oral   Take 1 tablet (800 mg total) by mouth 3 (three) times daily.   21 tablet   0     BP 115/73  Pulse 78  Temp(Src) 98.2 F (36.8 C) (Oral)  Resp 18  SpO2 98%  LMP 02/19/2013  Physical Exam  Nursing note and vitals reviewed. Constitutional: She is oriented to person, place, and time. She appears well-developed and  well-nourished. No distress.  HENT:  Head: Normocephalic and atraumatic.  Eyes: Conjunctivae and EOM are normal.  Neck: Normal range of motion. Neck supple.  No meningeal signs  Cardiovascular: Normal rate, regular rhythm and normal heart sounds.  Exam reveals no gallop and no friction rub.   No murmur heard. Pulmonary/Chest: Effort normal and breath sounds normal. No respiratory distress. She has no wheezes. She has no rales. She exhibits no tenderness.  Abdominal: Soft. Bowel sounds are normal. She exhibits no distension. There is no tenderness. There is no rebound and no guarding.  Musculoskeletal: Normal range of motion. She exhibits no edema and no tenderness.  5/5 strength throughout. No step-offs. No bony tenderness on spine. Tenderness to paraspinous muscles of lumbar spine. Normal strength in upper and lower extremities bilaterally including dorsiflexion and plantar flexion, strong and equal grip strength Sensation normal to light and sharp touch  Neurological: She is alert and oriented to person, place, and time. No cranial nerve deficit.  Speech is clear and goal oriented, follows commands Moves extremities without ataxia, coordination intact Normal gait and balance  Skin: Skin is warm and dry. She is not diaphoretic. No erythema.  Psychiatric:  Tearful, histrionic    ED Course  Procedures (including critical care time)  Labs Reviewed - No data to display No results found. Medications  oxyCODONE-acetaminophen (PERCOCET/ROXICET) 5-325 MG per tablet 2 tablet (2 tablets Oral Given 03/19/13 1027)   Discharge Medication List as of 03/19/2013 11:55 AM    START taking these medications   Details  !! ibuprofen (ADVIL,MOTRIN) 800 MG tablet Take 1 tablet (800 mg total) by mouth 3 (three) times daily., Starting 03/19/2013, Until Discontinued, Print     !! - Potential duplicate medications found. Please discuss with provider.       1. Back pain, chronic       MDM  37 y.o.  female presenting with lower back pain and posterior upper right leg pain. Pt has had several previous visits to ED for same or similar pain issues. Review of notes shows each provider has encouraged her to see a PCP, a spine specialist, or a neurologist. Pt has seen no one. Complete lumbar imaging from 07/2012 shows right curvature of lumbar spine, unchanged from 10/2009. Pt denies new trauma. No  neurological deficits and normal neuro exam.  Patient can walk but states is painful.  No loss of bowel or bladder control.  No concern for cauda equina.  No fever, night sweats, weight loss, h/o cancer, IVDU.  RICE protocol and treatment with NSAIDs discussed with patient. Again urged pt to follow up with neurology and/or spine specialist and provided contact information.  At this time there does not appear to be any evidence of an acute emergency medical condition and the patient appears stable for discharge with appropriate outpatient follow up.Diagnosis was discussed with patient who verbalizes understanding and is agreeable to discharge.   Glade Nurse, PA-C 03/19/13 2253

## 2013-03-19 NOTE — ED Notes (Signed)
Per pt sts right back pain and right upper leg pain. sts the pain is waking her up at night.

## 2013-03-19 NOTE — ED Notes (Signed)
Pt reports right lower back and leg pain that is waking her at night.  Pt states pain 8/10 and is shooting pain to knee. Pt denies injury.  Pt does lifting at work.  Pt alert oriented X4

## 2013-03-19 NOTE — ED Notes (Signed)
Up to bathroom

## 2013-03-20 NOTE — ED Provider Notes (Signed)
Medical screening examination/treatment/procedure(s) were performed by non-physician practitioner and as supervising physician I was immediately available for consultation/collaboration.    Vida Roller, MD 03/20/13 [phone]

## 2013-04-07 ENCOUNTER — Ambulatory Visit: Payer: Self-pay | Admitting: Obstetrics

## 2013-04-07 IMAGING — CR DG LUMBAR SPINE COMPLETE 4+V
5 series · 5 of 5 positions shown · non-contrast
Comparison: 11/09/2009

CLINICAL DATA: Fall, back pain.

LUMBAR SPINE - COMPLETE 4+ VIEW

[t l-spine a.p.]
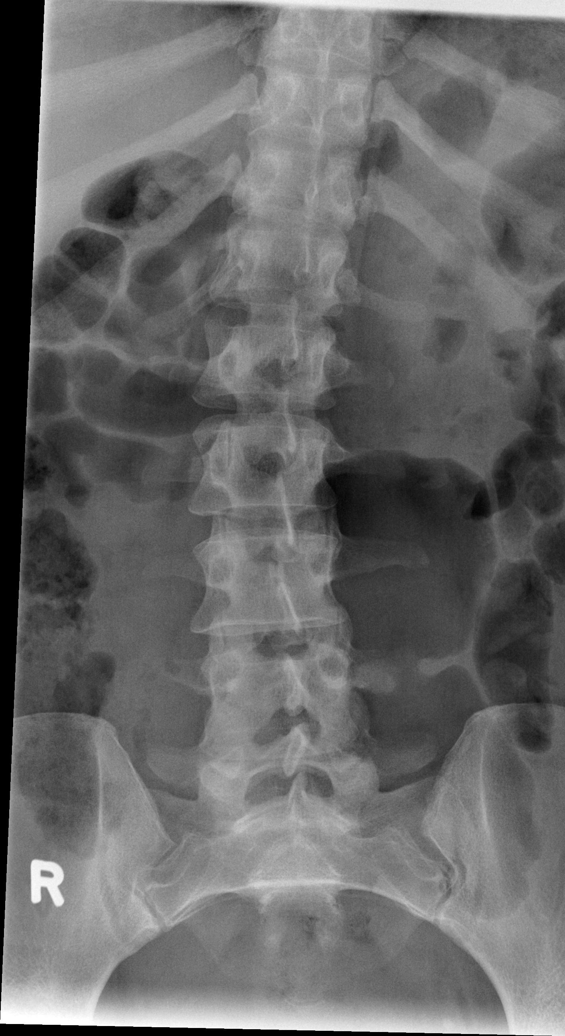

[t l-spine oblique exposure (1 of 2)]
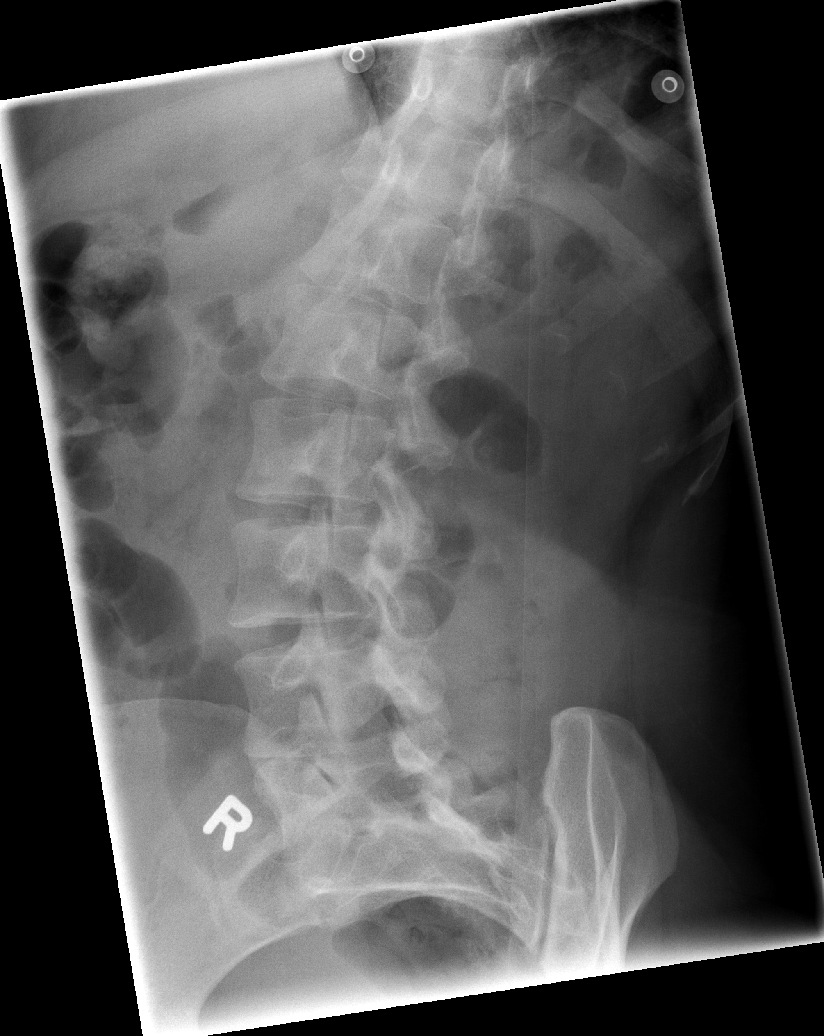

[t l-spine oblique exposure (2 of 2)]
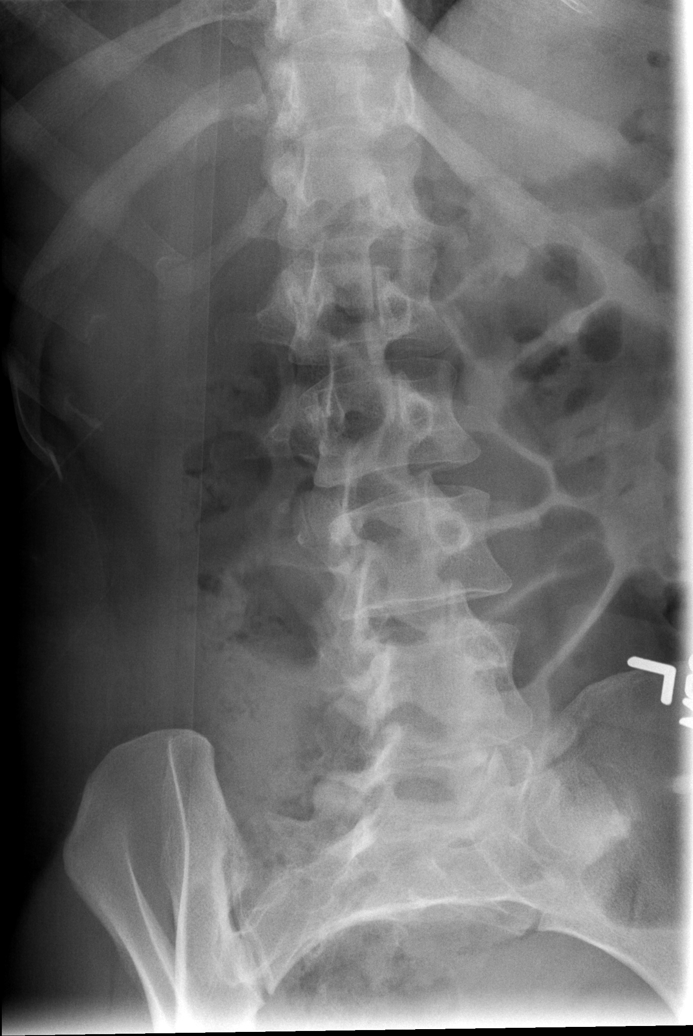

[t l-spine lat]
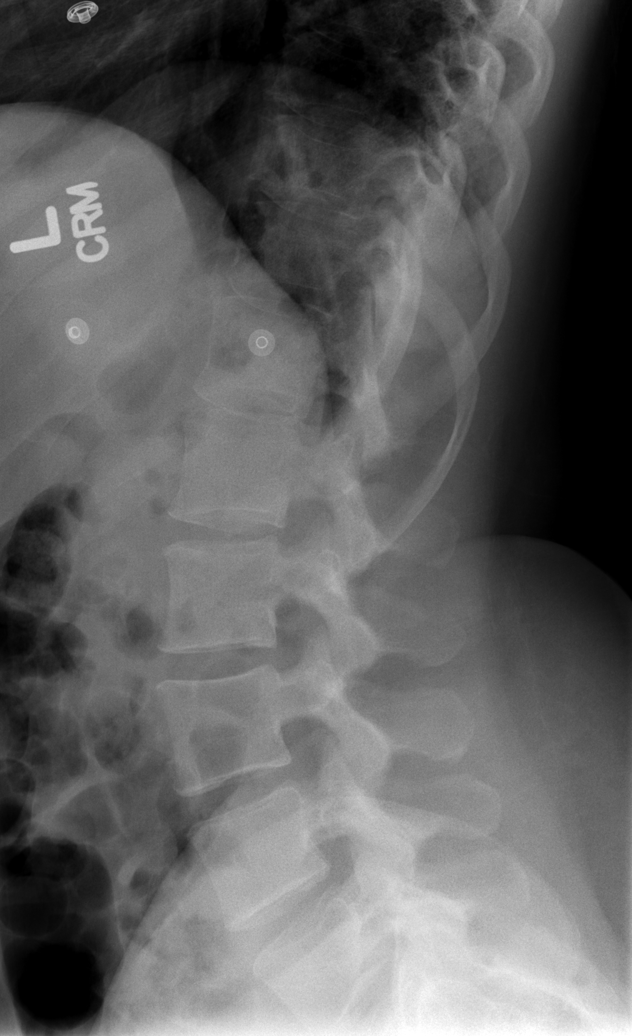

[t l-spine l5-s1 spot]
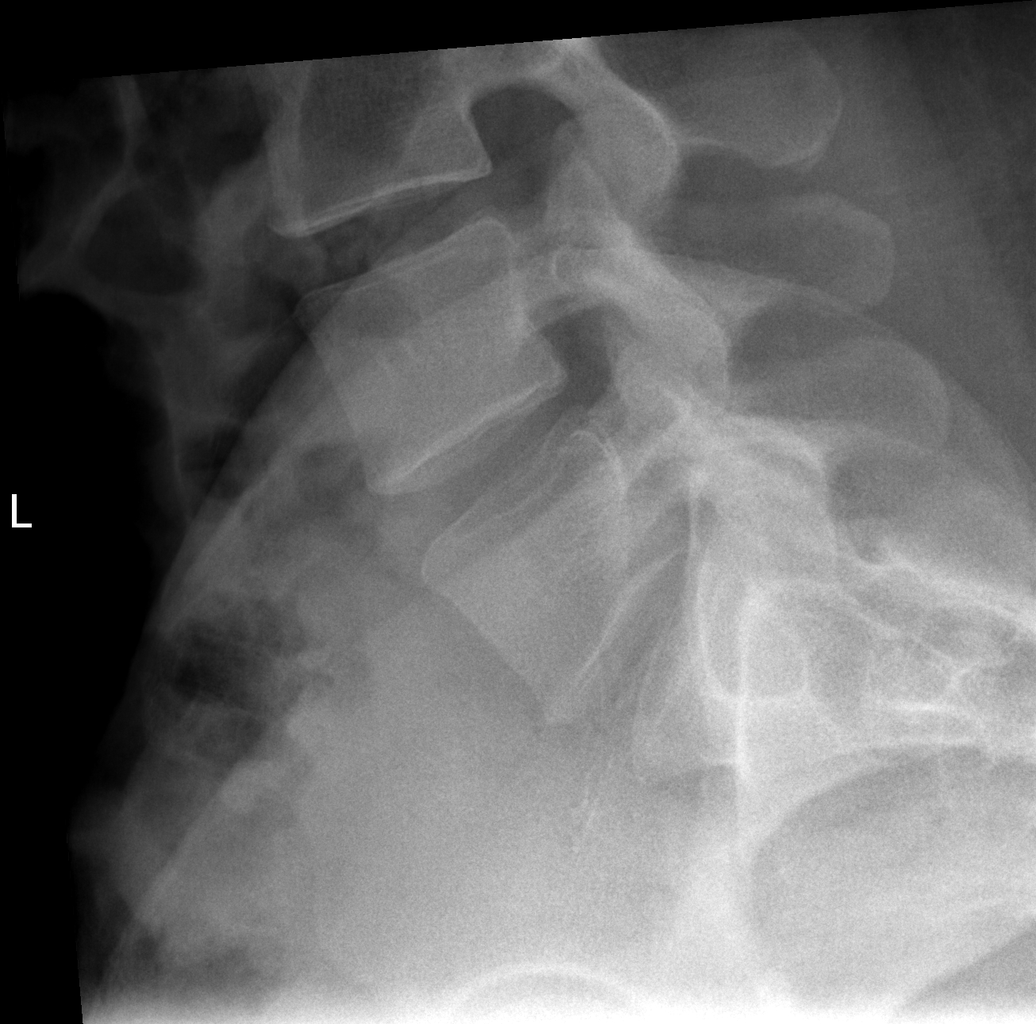

[5 of 5 positions shown; findings below may reference images not displayed]

FINDINGS: Mild rightward curvature of the lumbar spine is
unchanged.  Maintained vertebral body height and alignment.  No
acute fracture dislocation.
IMPRESSION: Mild rightward curvature is unchanged.  No acute fracture
dislocation of the lumbar spine.

## 2013-04-07 IMAGING — CR DG CHEST 1V
1 series · 1 of 1 positions shown · non-contrast
Comparison: 05/11/2012 chest radiograph

CLINICAL DATA: Fall, back pain.

CHEST - 1 VIEW

[t chest supine]
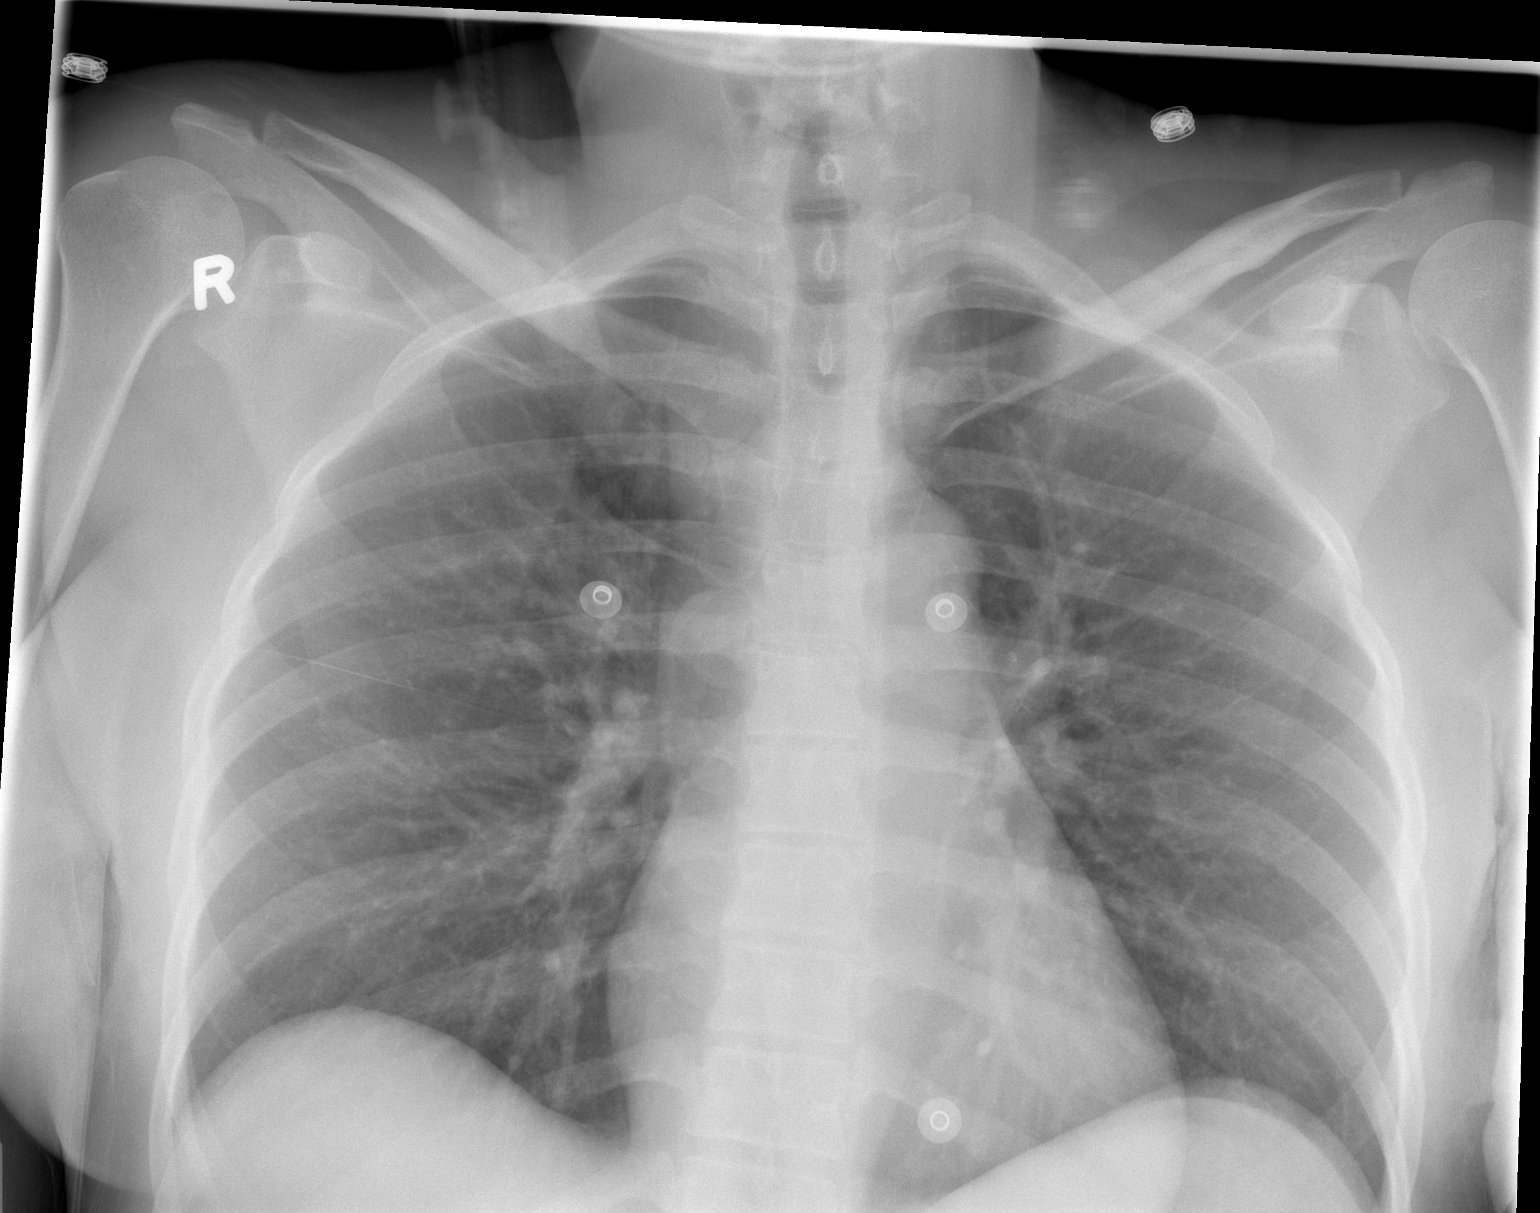

[1 of 1 positions shown; findings below may reference images not displayed]

FINDINGS: Allowing for differences in technique and inspiratory
effort, no interval change.  Lungs clear.  Cardiomediastinal
contours within normal range.  No pleural effusion or pneumothorax.
No acute osseous finding.
IMPRESSION: No radiographic evidence of acute cardiopulmonary process.

## 2013-04-07 IMAGING — CT CT HEAD W/O CM
1 of 2 series · 16 of 30 positions shown, 20 images · non-contrast
Comparison: 15 prior head CTs and brain MRIs, most recent
11/06/2010.

CLINICAL DATA: Fall, leg numbness.

CT HEAD WITHOUT CONTRAST
TECHNIQUE: Contiguous axial images were obtained from the base of
the skull through the vertex without contrast.

[Series 3: recon 2: brain · axial · 0.47mm/px · z∈[+149,+278]mm · 16 of 56 slices shown, 20 images]
[im 3/56  brain]
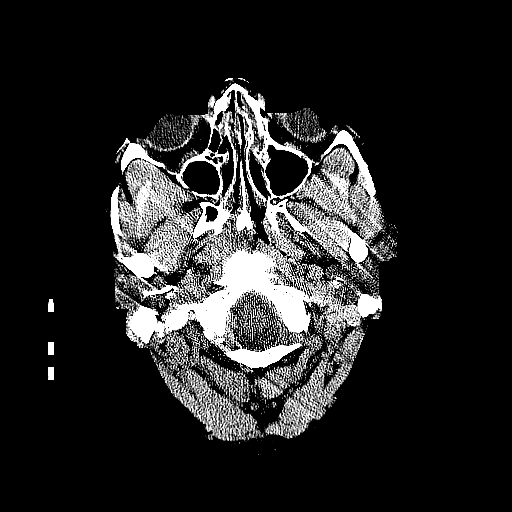
[im 3/56  bone]
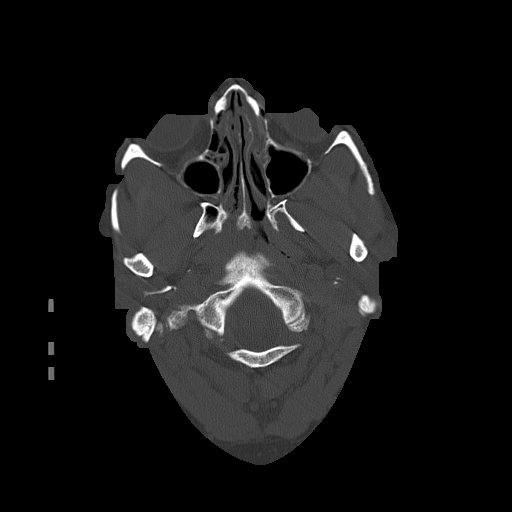
[im 6/56  brain]
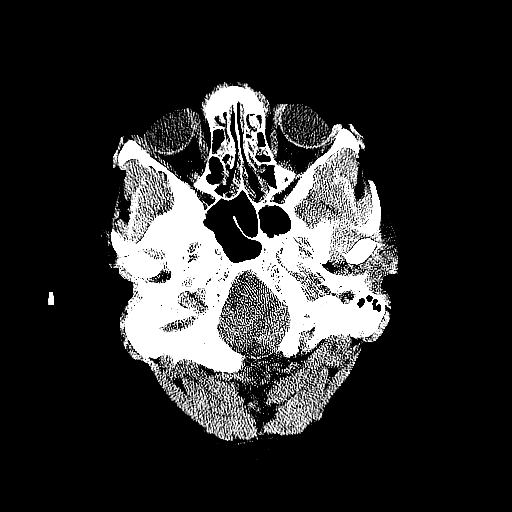
[im 9/56  brain]
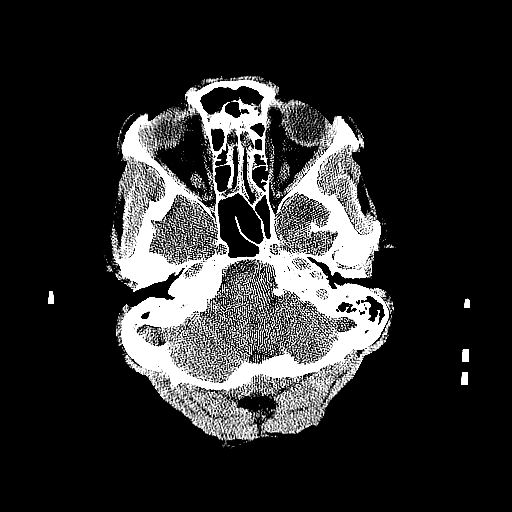
[im 12/56  brain]
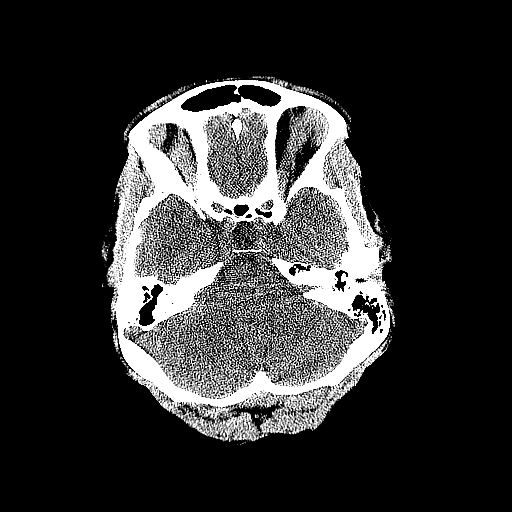
[im 18/56  brain]
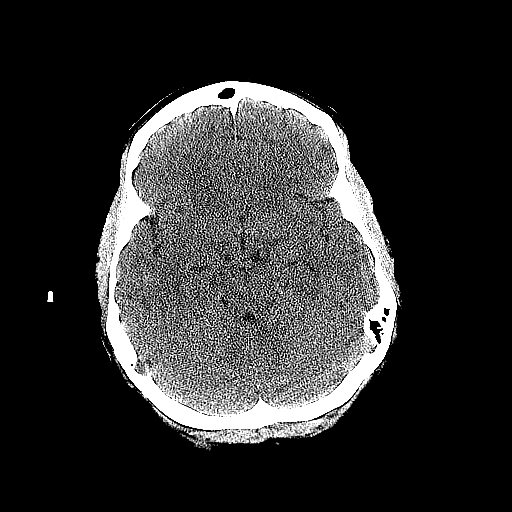
[im 18/56  bone]
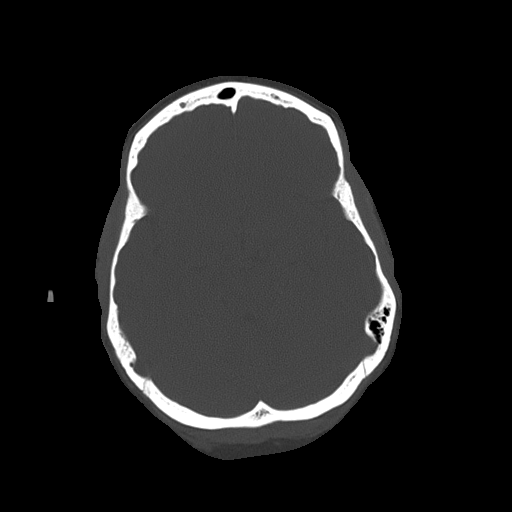
[im 21/56  brain]
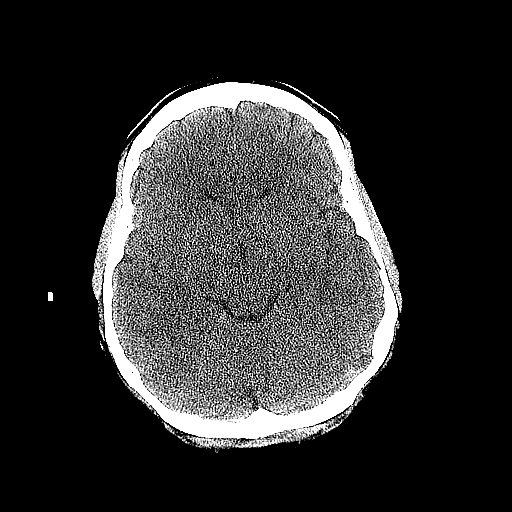
[im 24/56  brain]
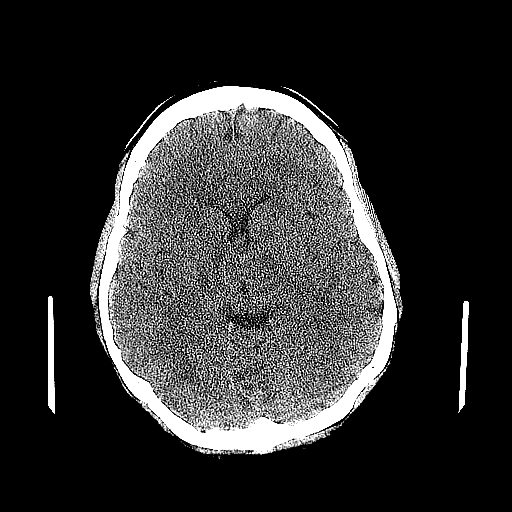
[im 27/56  brain]
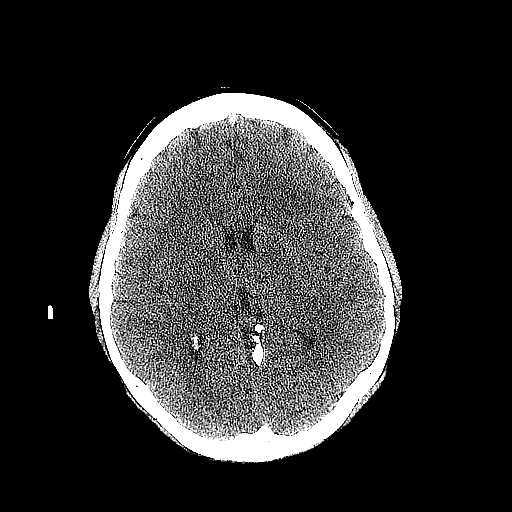
[im 29/56  brain]
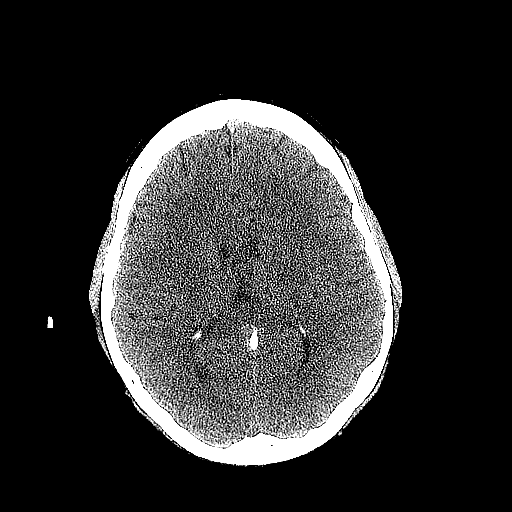
[im 29/56  bone]
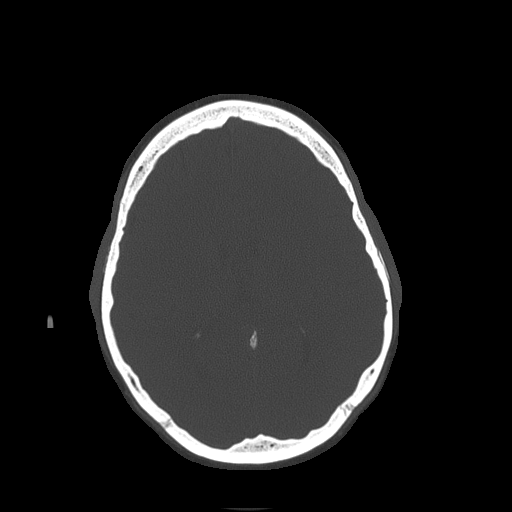
[im 32/56  brain]
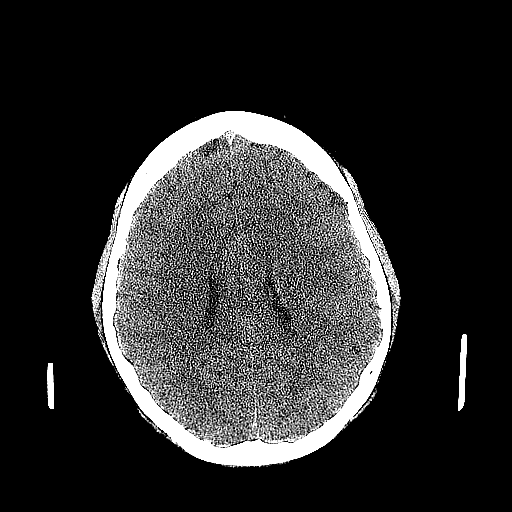
[im 35/56  brain]
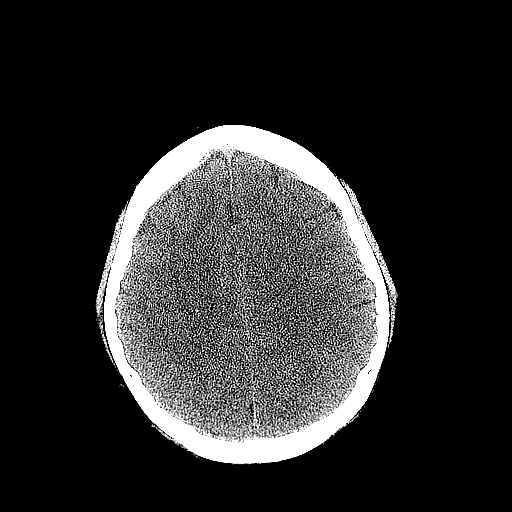
[im 38/56  brain]
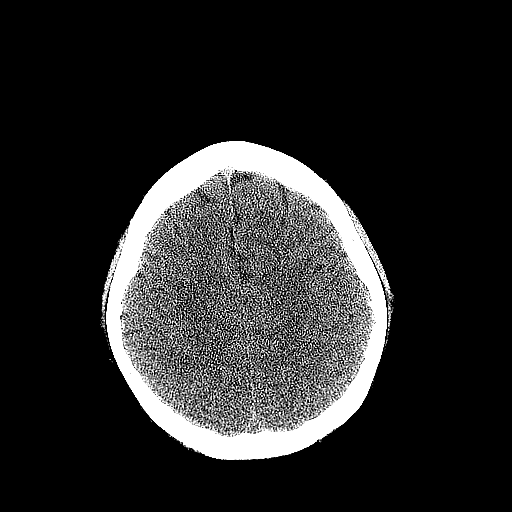
[im 44/56  brain]
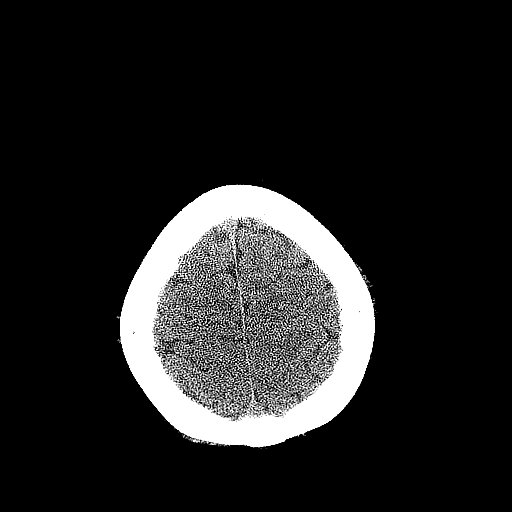
[im 44/56  bone]
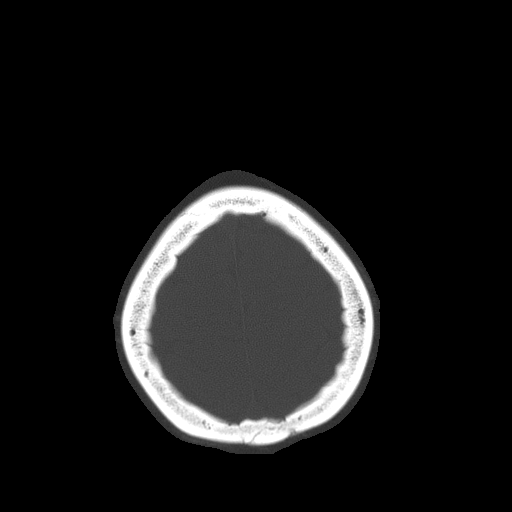
[im 47/56  brain]
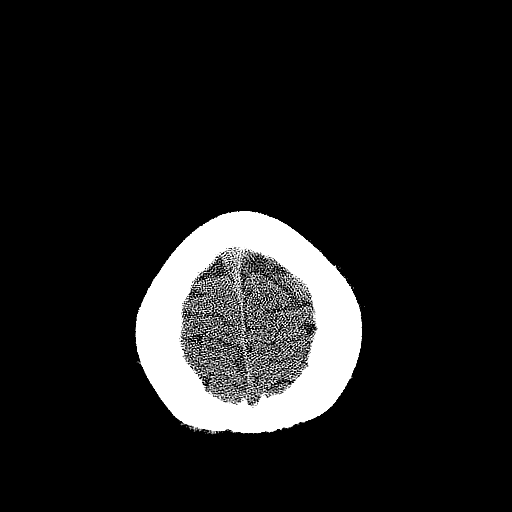
[im 50/56  brain]
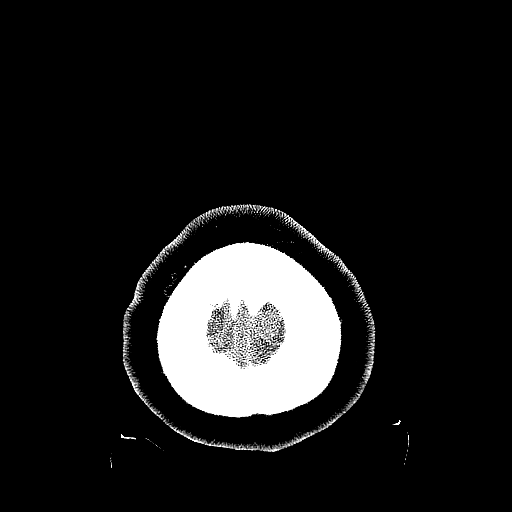
[im 53/56  brain]
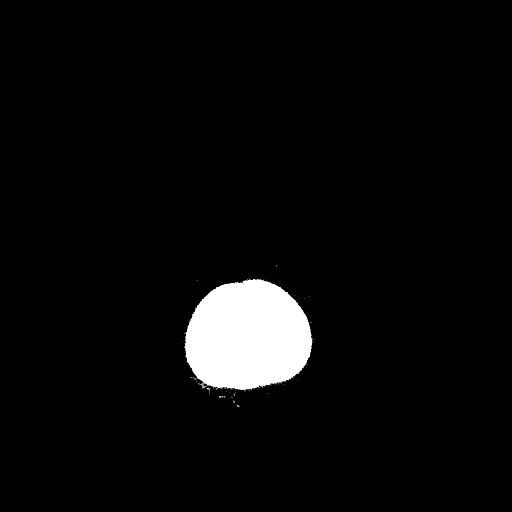

[16 of 30 positions shown; findings below may reference images not displayed]

FINDINGS: There is no evidence for acute hemorrhage, hydrocephalus,
mass lesion, or abnormal extra-axial fluid collection.  No definite
CT evidence for acute infarction. Question partially empty sella,
however, unchanged.  Partially opacified ethmoid air cells.
Otherwise the visualized paranasal sinuses and mastoid air cells
are predominately clear.  No displaced calvarial fracture.
IMPRESSION: No acute intracranial abnormality.

Partially opacified ethmoid air cells.  Correlate clinically if
concerned for sinusitis.

## 2013-04-07 IMAGING — CR DG CERVICAL SPINE COMPLETE 4+V
6 series · 6 of 6 positions shown · non-contrast
Comparison: 07/10/2010

CLINICAL DATA: Fall, spine pain.

CERVICAL SPINE - COMPLETE 4+ VIEW

[t c-spine a.p.]
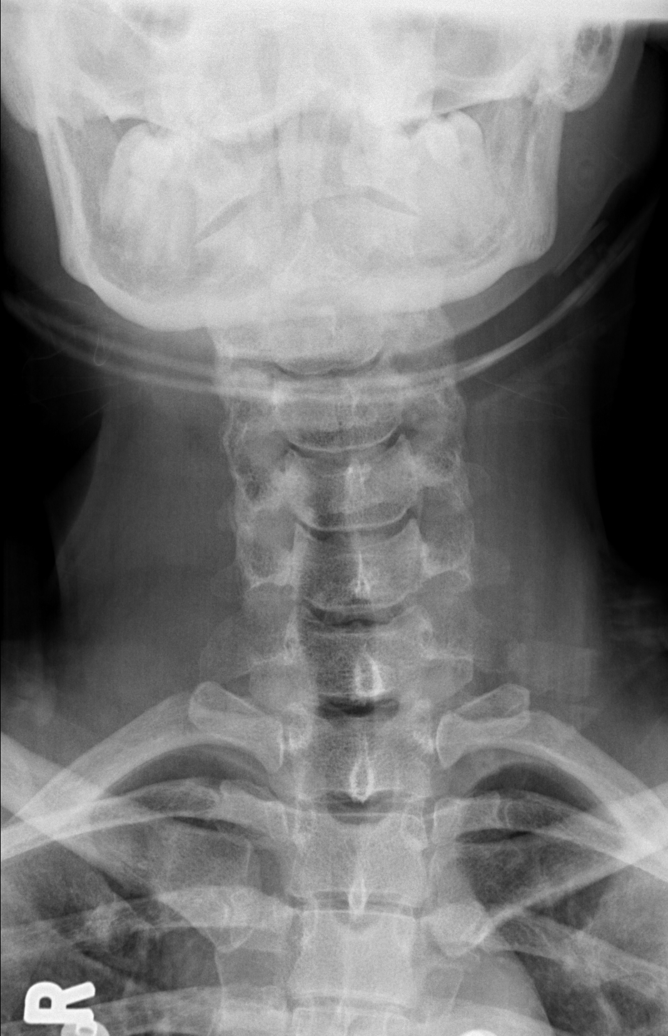

[t c-spine odontoid]
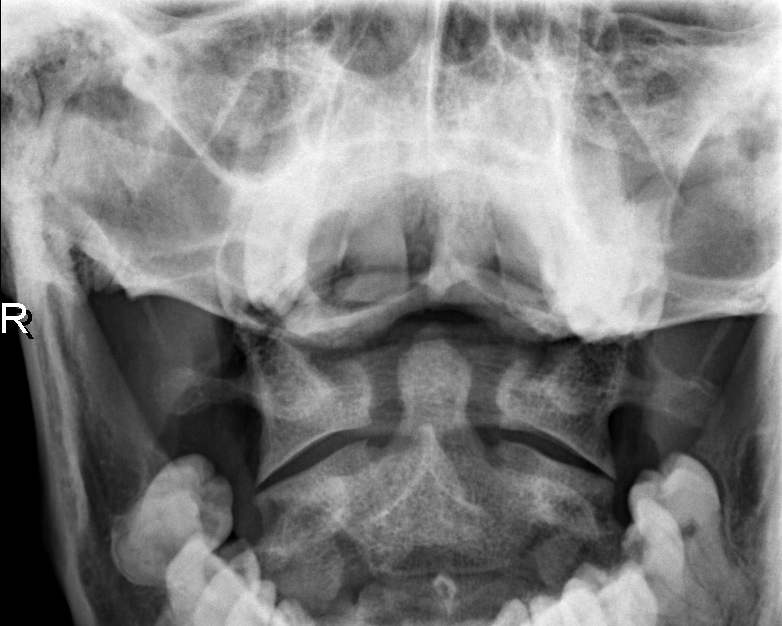

[t c-spine oblique (1 of 2)]
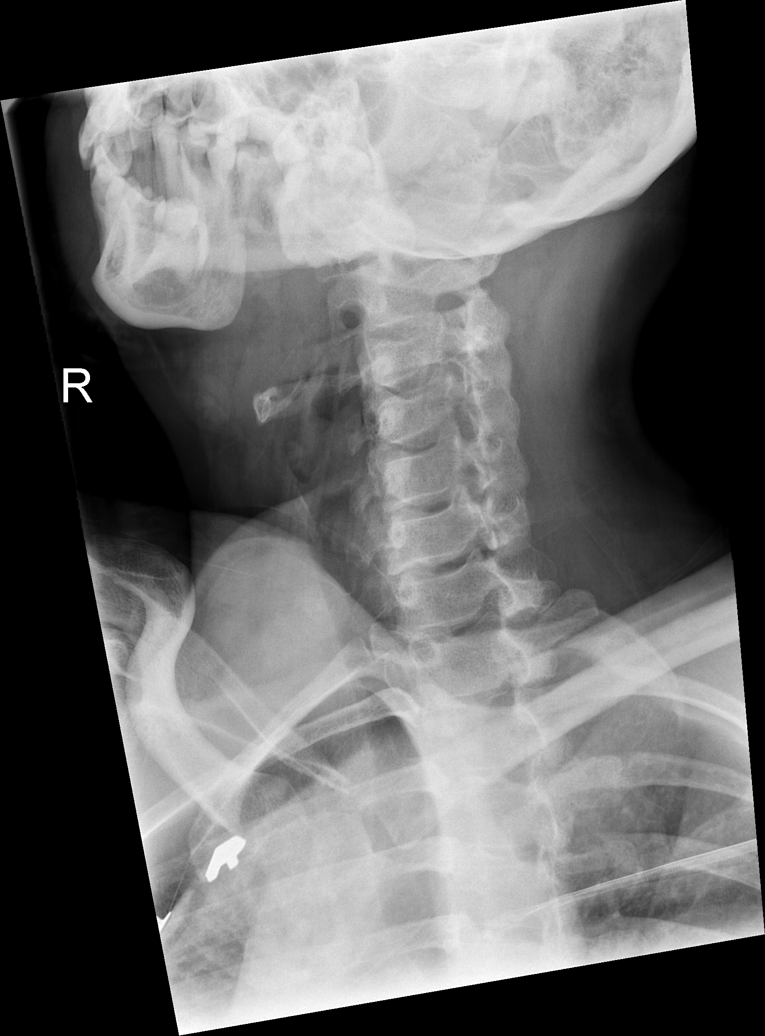

[t c-spine oblique (2 of 2)]
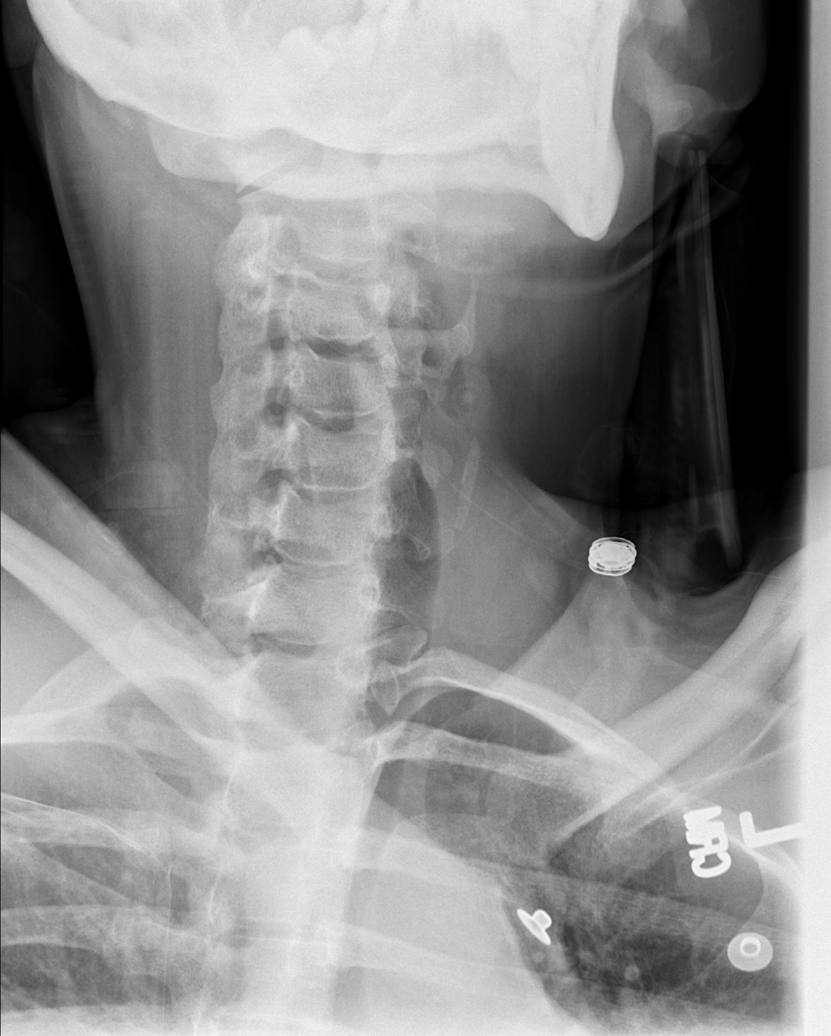

[w c-spine lat *]
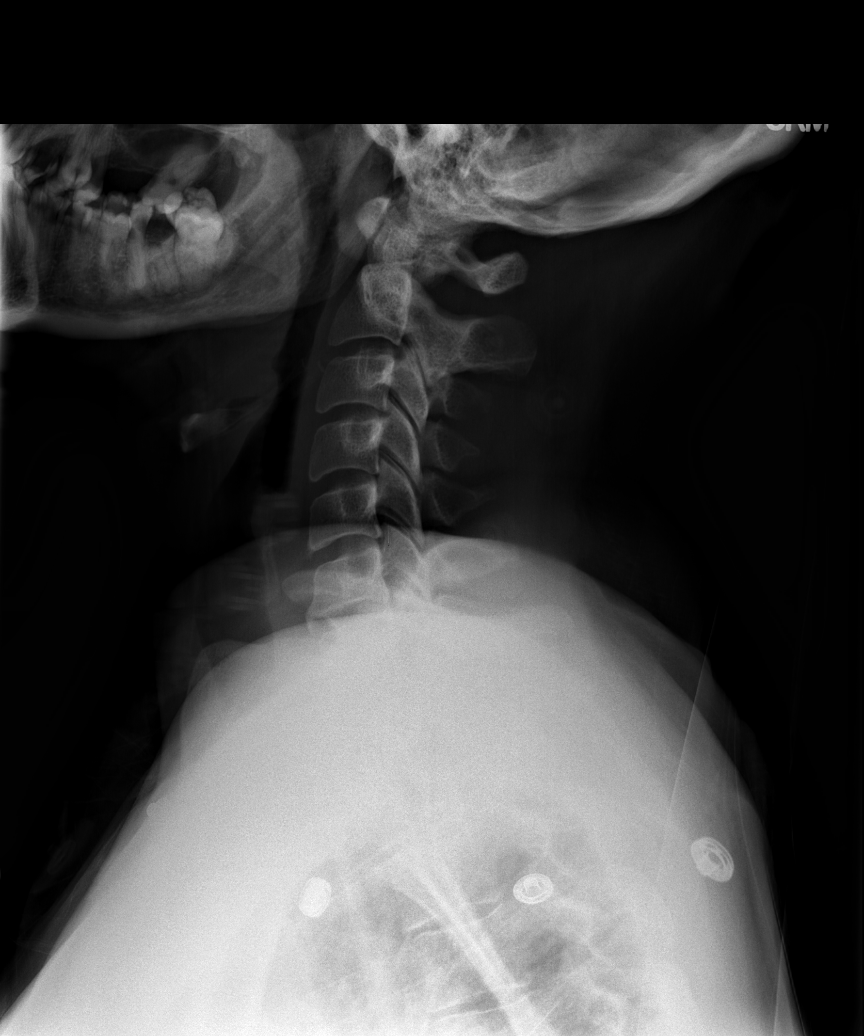

[w swimmers view]
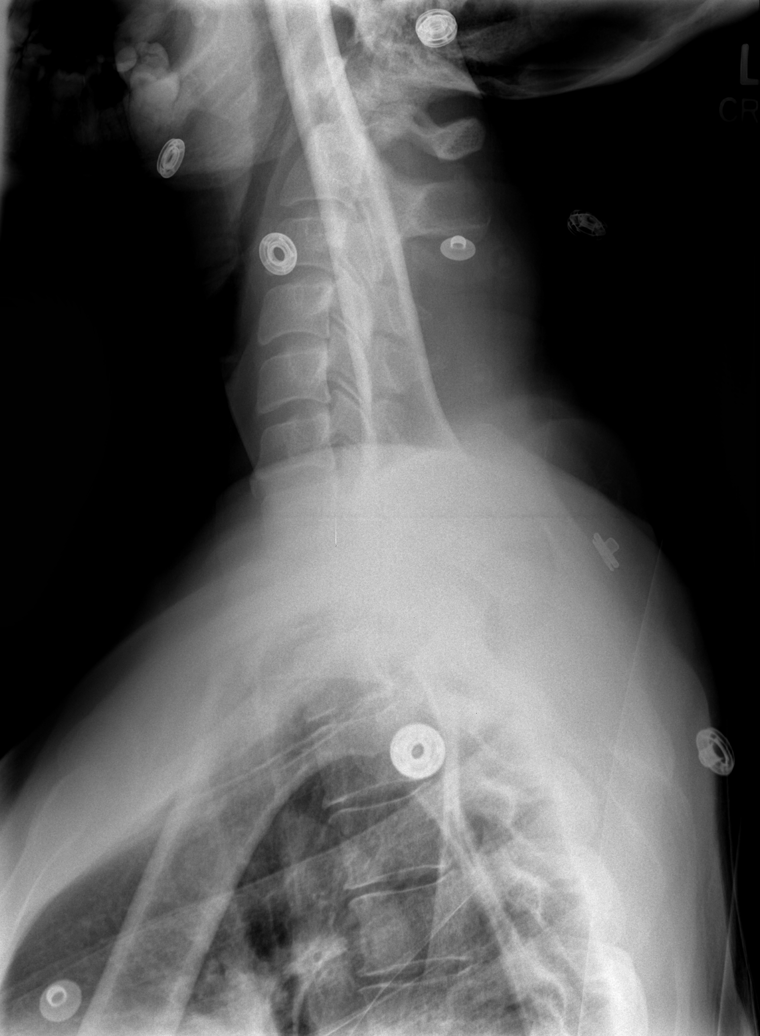

[6 of 6 positions shown; findings below may reference images not displayed]

FINDINGS: No dens fracture.  Maintained C1-2 articulation.  Mild
degenerative changes at C6-7 with anterior osteophyte formation.
Maintained vertebral body height alignment.  No acute fracture or
dislocation.  Paravertebral soft tissues within normal limits.
IMPRESSION: Mild C6-7 degenerative disc disease.

No acute osseous abnormality of the cervical spine

## 2013-04-07 IMAGING — CR DG THORACIC SPINE 3V
1 series · 1 of 1 positions shown · non-contrast
Comparison: 05/11/2012 chest radiograph

CLINICAL DATA: Trauma

THORACIC SPINE - 2 VIEW + SWIMMERS

[t t-spine a.p.]
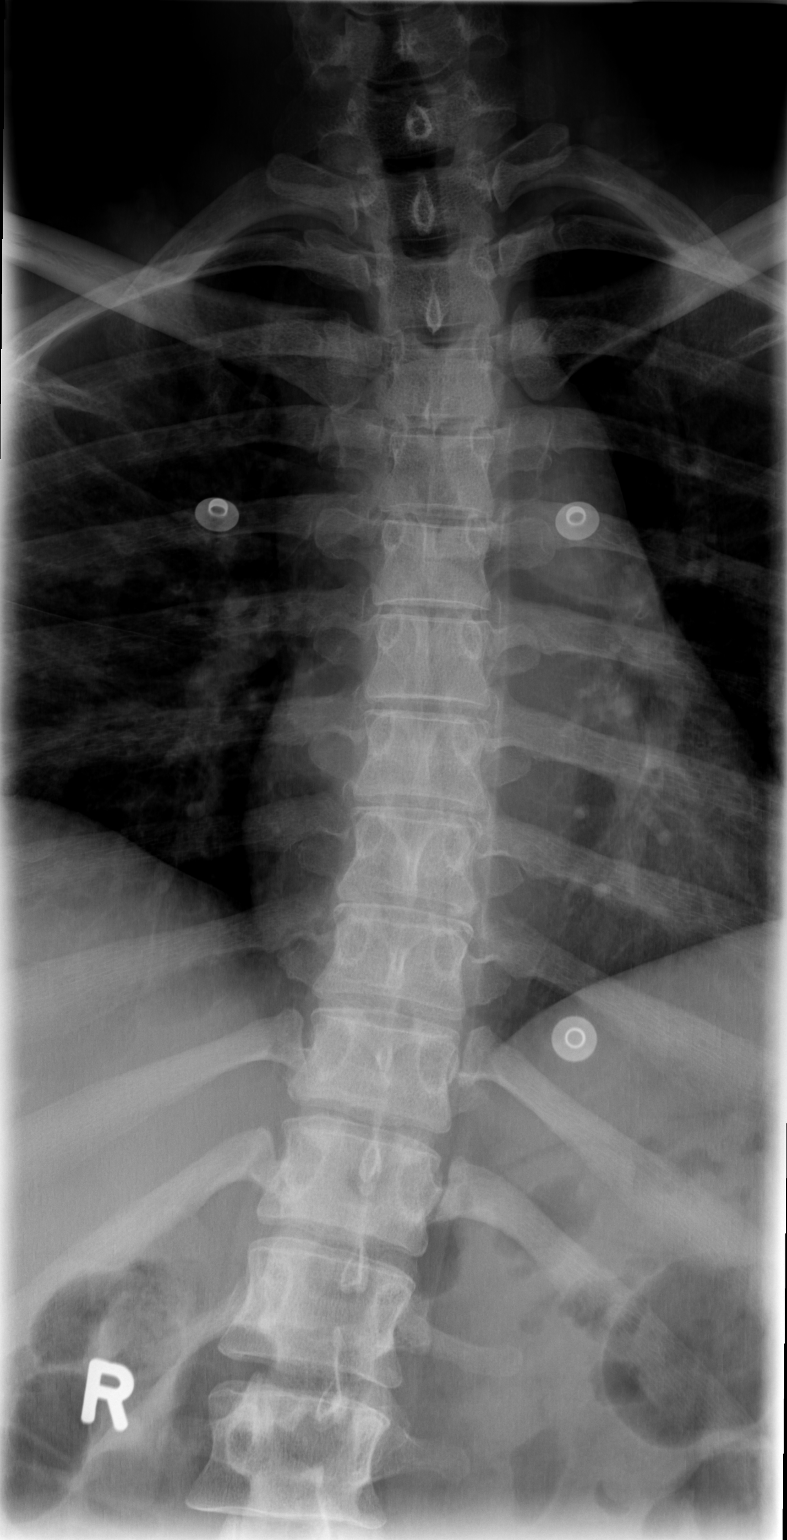

[1 of 1 positions shown; findings below may reference images not displayed]

FINDINGS: Mild leftward curvature is unchanged from comparison l.
No acute fracture.  No dislocation.  Overlying soft tissues
unremarkable.
IMPRESSION: No acute osseous abnormality of the thoracic spine.

## 2013-04-08 ENCOUNTER — Emergency Department (HOSPITAL_COMMUNITY)
Admission: EM | Admit: 2013-04-08 | Discharge: 2013-04-08 | Disposition: A | Discharge status: Home / Self Care | Payer: BC Managed Care – PPO | Attending: Emergency Medicine | Admitting: Emergency Medicine

## 2013-04-08 ENCOUNTER — Emergency Department (HOSPITAL_COMMUNITY)
Admission: EM | Admit: 2013-04-08 | Discharge: 2013-04-08 | Disposition: A | Discharge status: Home / Self Care | Payer: BC Managed Care – PPO

## 2013-04-08 ENCOUNTER — Encounter (HOSPITAL_COMMUNITY): Payer: Self-pay | Admitting: Emergency Medicine

## 2013-04-08 DIAGNOSIS — G40909 Epilepsy, unspecified, not intractable, without status epilepticus: Secondary | ICD-10-CM | POA: Insufficient documentation

## 2013-04-08 DIAGNOSIS — J45909 Unspecified asthma, uncomplicated: Secondary | ICD-10-CM | POA: Insufficient documentation

## 2013-04-08 DIAGNOSIS — Z87891 Personal history of nicotine dependence: Secondary | ICD-10-CM | POA: Insufficient documentation

## 2013-04-08 DIAGNOSIS — Z8742 Personal history of other diseases of the female genital tract: Secondary | ICD-10-CM | POA: Insufficient documentation

## 2013-04-08 DIAGNOSIS — Z862 Personal history of diseases of the blood and blood-forming organs and certain disorders involving the immune mechanism: Secondary | ICD-10-CM | POA: Insufficient documentation

## 2013-04-08 DIAGNOSIS — D573 Sickle-cell trait: Secondary | ICD-10-CM | POA: Insufficient documentation

## 2013-04-08 DIAGNOSIS — Z880 Allergy status to penicillin: Secondary | ICD-10-CM | POA: Insufficient documentation

## 2013-04-08 DIAGNOSIS — G43909 Migraine, unspecified, not intractable, without status migrainosus: Secondary | ICD-10-CM | POA: Insufficient documentation

## 2013-04-08 DIAGNOSIS — Z79899 Other long term (current) drug therapy: Secondary | ICD-10-CM | POA: Insufficient documentation

## 2013-04-08 MED ORDER — METOCLOPRAMIDE HCL 5 MG/ML IJ SOLN
10.0000 mg | Freq: Once | INTRAMUSCULAR | Status: AC
  Administered 2013-04-08: 10 mg via INTRAVENOUS
  Filled 2013-04-08: qty 2

## 2013-04-08 MED ORDER — DIPHENHYDRAMINE HCL 50 MG/ML IJ SOLN
25.0000 mg | Freq: Once | INTRAMUSCULAR | Status: AC
  Administered 2013-04-08: 25 mg via INTRAVENOUS
  Filled 2013-04-08 (×2): qty 1

## 2013-04-08 MED ORDER — SODIUM CHLORIDE 0.9 % IV BOLUS (SEPSIS)
1000.0000 mL | Freq: Once | INTRAVENOUS | Status: AC
  Administered 2013-04-08: 1000 mL via INTRAVENOUS

## 2013-04-08 MED ORDER — DEXAMETHASONE SODIUM PHOSPHATE 10 MG/ML IJ SOLN
10.0000 mg | Freq: Once | INTRAMUSCULAR | Status: AC
  Administered 2013-04-08: 10 mg via INTRAVENOUS
  Filled 2013-04-08: qty 1

## 2013-04-08 NOTE — ED Provider Notes (Signed)
History     CSN: [id number]  Arrival date & time 04/08/13  2045   First MD Initiated Contact with Patient 04/08/13 2051      Chief Complaint  Patient presents with  . Headache    (Consider location/radiation/quality/duration/timing/severity/associated sxs/prior treatment) Patient is a 37 y.o. female presenting with headaches. The history is provided by the patient and medical records.  Headache Pain location:  Frontal Quality:  Dull Radiates to:  Does not radiate Onset quality:  Gradual Duration:  1 day Timing:  Constant Progression:  Worsening Chronicity:  Recurrent Similar to prior headaches: yes   Context: bright light and loud noise   Context: not activity   Associated symptoms: no abdominal pain, no cough, no diarrhea, no dizziness, no fever, no neck pain, no neck stiffness, no numbness, no seizures and no vomiting     Past Medical History  Diagnosis Date  . Anemia   . Asthma   . Blood transfusion 2011    r/t anemia  . Migraine   . Sickle cell trait   . Seizures     last sz Jan 2012  . Seizures   . Fibroid     Past Surgical History  Procedure Laterality Date  . Tubal ligation Bilateral 2005    Family History  Problem Relation Age of Onset  . Anesthesia problems Neg Hx   . Hypertension Mother   . Diabetes Mother   . Cancer Mother   . Diabetes Father   . Diabetes Maternal Grandmother     History  Substance Use Topics  . Smoking status: Former Games developer  . Smokeless tobacco: Not on file  . Alcohol Use: No    OB History   Grav Para Term Preterm Abortions TAB SAB Ect Mult Living   3 2 1 1 1  0 0 1 0 2      Review of Systems  Constitutional: Negative for fever, chills, activity change and appetite change.  HENT: Negative for neck pain and neck stiffness.   Respiratory: Negative for cough, chest tightness, shortness of breath and wheezing.   Cardiovascular: Negative for chest pain and palpitations.  Gastrointestinal: Negative for vomiting,  abdominal pain, diarrhea and constipation.  Genitourinary: Negative for dysuria, decreased urine volume, difficulty urinating and menstrual problem.  Skin: Negative for rash and wound.  Neurological: Positive for headaches. Negative for dizziness, tremors, seizures, syncope, facial asymmetry, speech difficulty, weakness, light-headedness and numbness.  Psychiatric/Behavioral: Negative for confusion and agitation.  All other systems reviewed and are negative.    Allergies  Penicillins and Sulfa antibiotics  Home Medications   Current Outpatient Rx  Name  Route  Sig  Dispense  Refill  . albuterol (PROVENTIL HFA;VENTOLIN HFA) 108 (90 BASE) MCG/ACT inhaler   Inhalation   Inhale 2 puffs into the lungs every 6 (six) hours as needed. Shortness of breath          . carbamazepine (TEGRETOL) 200 MG tablet   Oral   Take 100 mg by mouth 2 (two) times daily.          . diclofenac (VOLTAREN) 75 MG EC tablet   Oral   Take 75 mg by mouth 2 (two) times daily with a meal.         . ibuprofen (ADVIL,MOTRIN) 600 MG tablet   Oral   Take 1 tablet (600 mg total) by mouth every 6 (six) hours as needed for pain.   30 tablet   1   . ibuprofen (ADVIL,MOTRIN) 800 MG tablet  Oral   Take 1 tablet (800 mg total) by mouth 3 (three) times daily.   21 tablet   0   . Iron-FA-B Cmp-C-Biot-Probiotic (FUSION PLUS PO)   Oral   Take 1 tablet by mouth daily with breakfast.         . phenytoin (DILANTIN) 100 MG ER capsule   Oral   Take 100 mg by mouth 4 (four) times daily.          Marland Kitchen topiramate (TOPAMAX) 50 MG tablet   Oral   Take 50 mg by mouth 2 (two) times daily.           . traMADol (ULTRAM) 50 MG tablet   Oral   Take 50 mg by mouth every 6 (six) hours as needed for pain.           BP 120/89  Pulse 78  Temp(Src) 98.7 F (37.1 C) (Oral)  Resp 18  SpO2 98%  LMP 03/23/2013  Physical Exam  Nursing note and vitals reviewed. Constitutional: She is oriented to person, place, and  time. She appears well-developed and well-nourished.  HENT:  Head: Normocephalic and atraumatic.  Right Ear: External ear normal.  Left Ear: External ear normal.  Nose: Nose normal.  Mouth/Throat: Oropharynx is clear and moist. No oropharyngeal exudate.  Eyes: Conjunctivae are normal. Pupils are equal, round, and reactive to light.  Neck: Normal range of motion. Neck supple.  Cardiovascular: Normal rate, regular rhythm, normal heart sounds and intact distal pulses.  Exam reveals no gallop and no friction rub.   No murmur heard. Pulmonary/Chest: Effort normal and breath sounds normal. No respiratory distress. She has no wheezes. She has no rales. She exhibits no tenderness.  Abdominal: Soft. Bowel sounds are normal. She exhibits no distension and no mass. There is no tenderness. There is no rebound and no guarding.  Musculoskeletal: Normal range of motion. She exhibits no edema and no tenderness.  Neurological: She is alert and oriented to person, place, and time. She displays normal reflexes. No cranial nerve deficit. She exhibits normal muscle tone. Coordination normal.  Skin: Skin is warm and dry.  Psychiatric: She has a normal mood and affect. Her behavior is normal. Judgment and thought content normal.    ED Course  Procedures (including critical care time)  Labs Reviewed  PREGNANCY, URINE   No results found.   1. Migraine headache       MDM  37 yo F w/hx of migraines presents for headache similar to previous migraines; gradual in onset with associated photophobia. No associated infectious si/sx. No recent trauma. Not sudden onset. No AMS. Non focal neuro exam. Clinical picture not concerning for subarachnoid hemorrhage, meningitis, brain tumor, or cavernous sinus thrombosis. Migraine cocktail administered with resolution of symptoms. Patient given return precautions, including worsening of signs or symptoms. Patient instructed to follow-up with primary care physician.           Clemetine Marker, MD 04/08/13 2226

## 2013-04-08 NOTE — ED Notes (Signed)
Pt presents with a headache that started at 1430 today, hx of migraines- pt states this feels like a migraine.  Pt took Motrin 800mg  around 1700 without relief.  Denies N/V.  Headache sensitive to light and sound.  Neuro intact.

## 2013-04-08 NOTE — ED Provider Notes (Signed)
On my exam this patient was in no distress.  She states that this is a headache typical for her migraine episodes.  Absent any neural complaints, neuro findings, she was appropriate for discharge following improvement with ED interventions  I saw and evaluated the patient, reviewed the resident's note and I agree with the findings and plan.   Gerhard Munch, MD 04/08/13 2328

## 2013-04-08 NOTE — ED Notes (Signed)
IV team paged.  

## 2013-04-23 ENCOUNTER — Encounter (HOSPITAL_COMMUNITY): Payer: Self-pay | Admitting: *Deleted

## 2013-04-23 DIAGNOSIS — J45909 Unspecified asthma, uncomplicated: Secondary | ICD-10-CM | POA: Insufficient documentation

## 2013-04-23 DIAGNOSIS — R079 Chest pain, unspecified: Secondary | ICD-10-CM | POA: Insufficient documentation

## 2013-04-23 DIAGNOSIS — D573 Sickle-cell trait: Secondary | ICD-10-CM | POA: Insufficient documentation

## 2013-04-23 DIAGNOSIS — Z880 Allergy status to penicillin: Secondary | ICD-10-CM | POA: Insufficient documentation

## 2013-04-23 DIAGNOSIS — Z5189 Encounter for other specified aftercare: Secondary | ICD-10-CM | POA: Insufficient documentation

## 2013-04-23 DIAGNOSIS — D649 Anemia, unspecified: Secondary | ICD-10-CM | POA: Insufficient documentation

## 2013-04-23 DIAGNOSIS — R569 Unspecified convulsions: Secondary | ICD-10-CM | POA: Insufficient documentation

## 2013-04-23 DIAGNOSIS — Z8742 Personal history of other diseases of the female genital tract: Secondary | ICD-10-CM | POA: Insufficient documentation

## 2013-04-23 DIAGNOSIS — Z8679 Personal history of other diseases of the circulatory system: Secondary | ICD-10-CM | POA: Insufficient documentation

## 2013-04-23 DIAGNOSIS — R209 Unspecified disturbances of skin sensation: Secondary | ICD-10-CM | POA: Insufficient documentation

## 2013-04-23 DIAGNOSIS — Z87891 Personal history of nicotine dependence: Secondary | ICD-10-CM | POA: Insufficient documentation

## 2013-04-23 DIAGNOSIS — Z79899 Other long term (current) drug therapy: Secondary | ICD-10-CM | POA: Insufficient documentation

## 2013-04-23 DIAGNOSIS — Z882 Allergy status to sulfonamides status: Secondary | ICD-10-CM | POA: Insufficient documentation

## 2013-04-23 NOTE — ED Notes (Signed)
Pt reports left sided chest pain all day, reports tonight pain started radiating down into left arm and into neck described as a tingling sensation. Pt reports nausea, sob, and dizziness.

## 2013-04-24 ENCOUNTER — Emergency Department (HOSPITAL_COMMUNITY)
Admission: EM | Admit: 2013-04-24 | Discharge: 2013-04-24 | Disposition: A | Discharge status: Home / Self Care | Payer: Self-pay | Attending: Emergency Medicine | Admitting: Emergency Medicine

## 2013-04-24 ENCOUNTER — Emergency Department (HOSPITAL_COMMUNITY): Payer: Self-pay

## 2013-04-24 DIAGNOSIS — R079 Chest pain, unspecified: Secondary | ICD-10-CM

## 2013-04-24 LAB — BASIC METABOLIC PANEL
CO2: 27 mEq/L (ref 19–32)
Chloride: 103 mEq/L (ref 96–112)
GFR calc Af Amer: >90 mL/min (ref >90)
Potassium: 3.6 mEq/L (ref 3.5–5.1)

## 2013-04-24 LAB — POCT I-STAT TROPONIN I: Troponin i, poc: 0 ng/mL (ref 0.00–0.08)

## 2013-04-24 LAB — CBC
HCT: 34.2 % — ABNORMAL LOW (ref 36.0–46.0)
Platelets: 242 10*3/uL (ref 150–400)
RBC: 4.14 MIL/uL (ref 3.87–5.11)
RDW: 15 % (ref 11.5–15.5)
WBC: 8.6 10*3/uL (ref 4.0–10.5)

## 2013-04-24 MED ORDER — MORPHINE SULFATE 4 MG/ML IJ SOLN
4.0000 mg | Freq: Once | INTRAMUSCULAR | Status: AC
  Administered 2013-04-24: 4 mg via INTRAVENOUS
  Filled 2013-04-24: qty 1

## 2013-04-24 MED ORDER — ASPIRIN 81 MG PO CHEW
324.0000 mg | CHEWABLE_TABLET | Freq: Once | ORAL | Status: AC
  Administered 2013-04-24: 324 mg via ORAL
  Filled 2013-04-24: qty 4

## 2013-04-24 MED ORDER — ONDANSETRON HCL 4 MG/2ML IJ SOLN
4.0000 mg | Freq: Once | INTRAMUSCULAR | Status: AC
  Administered 2013-04-24: 4 mg via INTRAVENOUS
  Filled 2013-04-24: qty 2

## 2013-04-24 NOTE — ED Provider Notes (Signed)
History     CSN: [id number]  Arrival date & time 04/23/13  2339   First MD Initiated Contact with Patient 04/24/13 0138      Chief Complaint  Patient presents with  . Chest Pain    (Consider location/radiation/quality/duration/timing/severity/associated sxs/prior treatment) HPI Hx per PT  - CP onset this am, present for over 12 hours continuous, feels like fluttering in her chest and radiates to her L arm with some numbness. No SOB. She notices these symptoms more while at rest and unable to sleep tonight, states h/o stroke in the past, followed by Dr Sharyn Lull.  She tells me that she has had a stress test in the past, that she "failed it" but did not have any further studies performed..pain is mild to mod. She denies h/o same. No leg pain or swelling, no HA or unilateral weakness. No diff with speech or gait.  Past Medical History  Diagnosis Date  . Anemia   . Asthma   . Blood transfusion 2011    r/t anemia  . Migraine   . Sickle cell trait   . Seizures     last sz Jan 2012  . Seizures   . Fibroid     Past Surgical History  Procedure Laterality Date  . Tubal ligation Bilateral 2005    Family History  Problem Relation Age of Onset  . Anesthesia problems Neg Hx   . Hypertension Mother   . Diabetes Mother   . Cancer Mother   . Diabetes Father   . Diabetes Maternal Grandmother     History  Substance Use Topics  . Smoking status: Former Games developer  . Smokeless tobacco: Not on file  . Alcohol Use: No    OB History   Grav Para Term Preterm Abortions TAB SAB Ect Mult Living   3 2 1 1 1  0 0 1 0 2      Review of Systems  Constitutional: Negative for fever and chills.  HENT: Negative for neck pain and neck stiffness.   Eyes: Negative for pain.  Respiratory: Negative for shortness of breath.   Cardiovascular: Positive for chest pain.  Gastrointestinal: Negative for abdominal pain.  Genitourinary: Negative for dysuria.  Musculoskeletal: Negative for back pain.   Skin: Negative for rash.  Neurological: Negative for headaches.  All other systems reviewed and are negative.    Allergies  Penicillins and Sulfa antibiotics  Home Medications   Current Outpatient Rx  Name  Route  Sig  Dispense  Refill  . carbamazepine (TEGRETOL) 200 MG tablet   Oral   Take 100 mg by mouth 2 (two) times daily.          . diclofenac (VOLTAREN) 75 MG EC tablet   Oral   Take 75 mg by mouth 2 (two) times daily with a meal.         . ibuprofen (ADVIL,MOTRIN) 800 MG tablet   Oral   Take 800 mg by mouth 3 (three) times daily. pain         . Iron-FA-B Cmp-C-Biot-Probiotic (FUSION PLUS PO)   Oral   Take 1 tablet by mouth daily with breakfast.         . phenytoin (DILANTIN) 100 MG ER capsule   Oral   Take 100 mg by mouth 4 (four) times daily.          Marland Kitchen topiramate (TOPAMAX) 50 MG tablet   Oral   Take 50 mg by mouth 2 (two) times daily.           Marland Kitchen  traMADol (ULTRAM) 50 MG tablet   Oral   Take 50 mg by mouth every 6 (six) hours as needed for pain.         Marland Kitchen albuterol (PROVENTIL HFA;VENTOLIN HFA) 108 (90 BASE) MCG/ACT inhaler   Inhalation   Inhale 2 puffs into the lungs every 6 (six) hours as needed. Shortness of breath            BP 122/80  Pulse 83  Temp(Src) 98.4 F (36.9 C) (Oral)  Resp 20  SpO2 97%  LMP 03/23/2013  Physical Exam  Constitutional: She is oriented to person, place, and time. She appears well-developed and well-nourished.  HENT:  Head: Normocephalic and atraumatic.  Eyes: Conjunctivae and EOM are normal. Pupils are equal, round, and reactive to light.  Neck: Trachea normal. Neck supple. No thyromegaly present.  Cardiovascular: Normal rate, regular rhythm, S1 normal, S2 normal and normal pulses.     No systolic murmur is present   No diastolic murmur is present  Pulses:      Radial pulses are 2+ on the right side, and 2+ on the left side.  Pulmonary/Chest: Effort normal and breath sounds normal. She has no  wheezes. She has no rhonchi. She has no rales. She exhibits no tenderness.  Abdominal: Soft. Normal appearance and bowel sounds are normal. There is no tenderness. There is no CVA tenderness and negative Murphy's sign.  Musculoskeletal:  BLE:s Calves nontender, no cords or erythema, negative Homans sign  Neurological: She is alert and oriented to person, place, and time. She has normal strength. No cranial nerve deficit or sensory deficit. She exhibits normal muscle tone. Coordination normal. GCS eye subscore is 4. GCS verbal subscore is 5. GCS motor subscore is 6.  Skin: Skin is warm and dry. No rash noted. She is not diaphoretic.  Psychiatric: Her speech is normal.  Cooperative and appropriate    ED Course  Procedures (including critical care time)  Labs Reviewed  CBC - Abnormal; Notable for the following:    Hemoglobin 11.6 (*)    HCT 34.2 (*)    All other components within normal limits  BASIC METABOLIC PANEL - Abnormal; Notable for the following:    GFR calc non Af Amer 89 (*)    All other components within normal limits  POCT I-STAT TROPONIN I   Dg Chest 2 View  04/24/2013   *RADIOLOGY REPORT*  Clinical Data: Chest pain.  CHEST - 2 VIEW  Comparison: 12/16/2012  Findings: Heart and mediastinal contours are within normal limits. No focal opacities or effusions.  No acute bony abnormality.  IMPRESSION: No active cardiopulmonary disease.   Original Report Authenticated By: Charlett Nose, M.D.     Date: 04/24/2013  Rate: 76  Rhythm: normal sinus rhythm  QRS Axis: normal  Intervals: normal  ST/T Wave abnormalities: nonspecific ST changes  Conduction Disutrbances:none  Narrative Interpretation:   Old EKG Reviewed: none available  IV morphine/ zofran - CP resolved  Old records reviewed  2:14 AM d/w PCP - plan d/c home, follow up in office in am Dr Sharyn Lull   MDM  CP and palpitations atypical for ACS  ECG, labs, CXR  PCP/ CAR consult - plan follow up today in clinic  VS,  previous records and nursing notes reviewed      Sunnie Nielsen, MD 04/24/13 [phone]

## 2013-04-28 ENCOUNTER — Ambulatory Visit: Payer: Self-pay | Admitting: Obstetrics

## 2013-05-29 ENCOUNTER — Ambulatory Visit: Payer: Self-pay

## 2013-06-18 ENCOUNTER — Inpatient Hospital Stay (HOSPITAL_COMMUNITY)
Admission: AD | Admit: 2013-06-18 | Discharge: 2013-06-18 | Disposition: A | Discharge status: Home / Self Care | Payer: Self-pay | Source: Ambulatory Visit | Attending: Obstetrics & Gynecology | Admitting: Obstetrics & Gynecology

## 2013-06-18 ENCOUNTER — Inpatient Hospital Stay (HOSPITAL_COMMUNITY): Payer: Self-pay

## 2013-06-18 ENCOUNTER — Encounter (HOSPITAL_COMMUNITY): Payer: Self-pay

## 2013-06-18 DIAGNOSIS — R109 Unspecified abdominal pain: Secondary | ICD-10-CM | POA: Insufficient documentation

## 2013-06-18 DIAGNOSIS — N72 Inflammatory disease of cervix uteri: Secondary | ICD-10-CM | POA: Insufficient documentation

## 2013-06-18 DIAGNOSIS — M545 Low back pain, unspecified: Secondary | ICD-10-CM | POA: Insufficient documentation

## 2013-06-18 DIAGNOSIS — N390 Urinary tract infection, site not specified: Secondary | ICD-10-CM | POA: Insufficient documentation

## 2013-06-18 DIAGNOSIS — Z880 Allergy status to penicillin: Secondary | ICD-10-CM | POA: Insufficient documentation

## 2013-06-18 DIAGNOSIS — B373 Candidiasis of vulva and vagina: Secondary | ICD-10-CM | POA: Insufficient documentation

## 2013-06-18 DIAGNOSIS — R300 Dysuria: Secondary | ICD-10-CM | POA: Insufficient documentation

## 2013-06-18 DIAGNOSIS — B3731 Acute candidiasis of vulva and vagina: Secondary | ICD-10-CM | POA: Insufficient documentation

## 2013-06-18 HISTORY — DX: Periapical abscess without sinus: K04.7

## 2013-06-18 LAB — CBC
Platelets: 231 10*3/uL (ref 150–400)
RBC: 4.18 MIL/uL (ref 3.87–5.11)
RDW: 13.5 % (ref 11.5–15.5)
WBC: 6.4 10*3/uL (ref 4.0–10.5)

## 2013-06-18 LAB — URINALYSIS, ROUTINE W REFLEX MICROSCOPIC
Bilirubin Urine: NEGATIVE
Glucose, UA: NEGATIVE mg/dL
Ketones, ur: NEGATIVE mg/dL
Protein, ur: NEGATIVE mg/dL

## 2013-06-18 LAB — WET PREP, GENITAL
Clue Cells Wet Prep HPF POC: NONE SEEN
Trich, Wet Prep: NONE SEEN

## 2013-06-18 LAB — URINE MICROSCOPIC-ADD ON

## 2013-06-18 MED ORDER — FLUCONAZOLE 150 MG PO TABS: 150.0000 mg | ORAL_TABLET | Freq: Once | ORAL | Status: DC

## 2013-06-18 MED ORDER — NITROFURANTOIN MONOHYD MACRO 100 MG PO CAPS: 100.0000 mg | ORAL_CAPSULE | Freq: Two times a day (BID) | ORAL | Status: DC

## 2013-06-18 MED ORDER — TRAMADOL HCL 50 MG PO TABS
100.0000 mg | ORAL_TABLET | Freq: Once | ORAL | Status: AC
  Administered 2013-06-18: 100 mg via ORAL
  Filled 2013-06-18: qty 2

## 2013-06-18 MED ORDER — CEFTRIAXONE SODIUM 250 MG IJ SOLR: 250.0000 mg | Freq: Once | INTRAMUSCULAR | Status: DC

## 2013-06-18 MED ORDER — KETOROLAC TROMETHAMINE 60 MG/2ML IM SOLN
60.0000 mg | Freq: Once | INTRAMUSCULAR | Status: AC
  Administered 2013-06-18: 60 mg via INTRAMUSCULAR
  Filled 2013-06-18: qty 2

## 2013-06-18 MED ORDER — AZITHROMYCIN 250 MG PO TABS: 1000.0000 mg | ORAL_TABLET | Freq: Every day | ORAL | Status: DC

## 2013-06-18 NOTE — MAU Provider Note (Signed)
History     CSN: [id number]  Arrival date and time: 06/18/13 4098   None     Chief Complaint  Patient presents with  . Dysuria   HPI  Pt is not pregnant and presents with pain with urination feeling pressure  With frequent urination of small amount and incomplete emptying of bladder.  Pt has been drinking cranberry juice and taking Azo with no relief of pain.  Pt has hx of bladder infection 18 years ago with pregnancy and then about 5 years ago.  Pt denies vaginal discharge, itching or burning.  Pt is on Depo with last injection in June 2014 at Dr. Thomes Lolling.  Pt has had a fever of 100.1 at night.  Pt also has low back pain.  Pt had intercourse 4 weeks ago without any pain at that time.  Past Medical History  Diagnosis Date  . Anemia   . Asthma   . Blood transfusion 2011    r/t anemia  . Migraine   . Sickle cell trait   . Seizures     last sz Jan 2012  . Seizures   . Fibroid   . Tooth abscess     Past Surgical History  Procedure Laterality Date  . Tubal ligation Bilateral 2005    Family History  Problem Relation Age of Onset  . Anesthesia problems Neg Hx   . Hypertension Mother   . Diabetes Mother   . Cancer Mother   . Diabetes Father   . Diabetes Maternal Grandmother     History  Substance Use Topics  . Smoking status: Former Games developer  . Smokeless tobacco: Not on file  . Alcohol Use: No    Allergies:  Allergies  Allergen Reactions  . Penicillins Swelling  . Sulfa Antibiotics Rash    Prescriptions prior to admission  Medication Sig Dispense Refill  . carbamazepine (TEGRETOL) 200 MG tablet Take 100 mg by mouth 2 (two) times daily.       . diclofenac (VOLTAREN) 75 MG EC tablet Take 75 mg by mouth 2 (two) times daily with a meal.      . ibuprofen (ADVIL,MOTRIN) 800 MG tablet Take 800 mg by mouth 3 (three) times daily. pain      . Iron-FA-B Cmp-C-Biot-Probiotic (FUSION PLUS PO) Take 1 tablet by mouth daily with breakfast.      . phenytoin (DILANTIN) 100 MG  ER capsule Take 100 mg by mouth 4 (four) times daily.       Marland Kitchen topiramate (TOPAMAX) 50 MG tablet Take 50 mg by mouth 2 (two) times daily.        . traMADol (ULTRAM) 50 MG tablet Take 50 mg by mouth every 6 (six) hours as needed for pain.      Marland Kitchen albuterol (PROVENTIL HFA;VENTOLIN HFA) 108 (90 BASE) MCG/ACT inhaler Inhale 2 puffs into the lungs every 6 (six) hours as needed. Shortness of breath         Review of Systems  Constitutional: Negative for fever and chills.  Gastrointestinal: Positive for abdominal pain. Negative for diarrhea and constipation.  Genitourinary: Positive for dysuria, urgency and frequency. Negative for flank pain.   Physical Exam   Blood pressure 120/84, pulse 99, temperature 98 F (36.7 C), temperature source Oral, resp. rate 16, height 5' 5.5" (1.664 m), weight 103.783 kg (228 lb 12.8 oz), SpO2 100.00%.  Physical Exam  Nursing note and vitals reviewed. Constitutional: She is oriented to person, place, and time. She appears well-developed and well-nourished.  HENT:  Head: Normocephalic.  Eyes: Pupils are equal, round, and reactive to light.  Neck: Normal range of motion. Neck supple.  Cardiovascular: Normal rate.   Respiratory: Effort normal.  GI: Soft. She exhibits no distension and no mass. There is tenderness. There is guarding. There is no rebound.  Genitourinary:  Vulva reddened; vaginal mucosa reddened; mod-large amount of thick cottage cheese type discharge; cervix clean; bimanual diffusely tender with guarding; no appreciable enlargement of uterus or ovaries  Musculoskeletal: Normal range of motion.  Neurological: She is alert and oriented to person, place, and time.  Skin: Skin is warm and dry.  Psychiatric: She has a normal mood and affect.    MAU Course  Procedures Results for orders placed during the hospital encounter of 06/18/13 (from the past 24 hour(s))  URINALYSIS, ROUTINE W REFLEX MICROSCOPIC     Status: Abnormal   Collection Time     06/18/13  9:15 AM      Result Value Range   Color, Urine YELLOW  YELLOW   APPearance CLOUDY (*) CLEAR   Specific Gravity, Urine >1.030 (*) 1.005 - 1.030   pH 6.0  5.0 - 8.0   Glucose, UA NEGATIVE  NEGATIVE mg/dL   Hgb urine dipstick SMALL (*) NEGATIVE   Bilirubin Urine NEGATIVE  NEGATIVE   Ketones, ur NEGATIVE  NEGATIVE mg/dL   Protein, ur NEGATIVE  NEGATIVE mg/dL   Urobilinogen, UA 0.2  0.0 - 1.0 mg/dL   Nitrite POSITIVE (*) NEGATIVE   Leukocytes, UA MODERATE (*) NEGATIVE  URINE MICROSCOPIC-ADD ON     Status: Abnormal   Collection Time    06/18/13  9:15 AM      Result Value Range   Squamous Epithelial / LPF FEW (*) RARE   WBC, UA TOO NUMEROUS TO COUNT  <3 WBC/hpf   Bacteria, UA MANY (*) RARE  POCT PREGNANCY, URINE     Status: None   Collection Time    06/18/13  9:16 AM      Result Value Range   Preg Test, Ur NEGATIVE  NEGATIVE   Results for orders placed during the hospital encounter of 06/18/13 (from the past 24 hour(s))  URINALYSIS, ROUTINE W REFLEX MICROSCOPIC     Status: Abnormal   Collection Time    06/18/13  9:15 AM      Result Value Range   Color, Urine YELLOW  YELLOW   APPearance CLOUDY (*) CLEAR   Specific Gravity, Urine >1.030 (*) 1.005 - 1.030   pH 6.0  5.0 - 8.0   Glucose, UA NEGATIVE  NEGATIVE mg/dL   Hgb urine dipstick SMALL (*) NEGATIVE   Bilirubin Urine NEGATIVE  NEGATIVE   Ketones, ur NEGATIVE  NEGATIVE mg/dL   Protein, ur NEGATIVE  NEGATIVE mg/dL   Urobilinogen, UA 0.2  0.0 - 1.0 mg/dL   Nitrite POSITIVE (*) NEGATIVE   Leukocytes, UA MODERATE (*) NEGATIVE  URINE MICROSCOPIC-ADD ON     Status: Abnormal   Collection Time    06/18/13  9:15 AM      Result Value Range   Squamous Epithelial / LPF FEW (*) RARE   WBC, UA TOO NUMEROUS TO COUNT  <3 WBC/hpf   Bacteria, UA MANY (*) RARE  POCT PREGNANCY, URINE     Status: None   Collection Time    06/18/13  9:16 AM      Result Value Range   Preg Test, Ur NEGATIVE  NEGATIVE  WET PREP, GENITAL     Status:  Abnormal  Collection Time    06/18/13  9:55 AM      Result Value Range   Yeast Wet Prep HPF POC TOO NUMEROUS TO COUNT (*) NONE SEEN   Trich, Wet Prep NONE SEEN  NONE SEEN   Clue Cells Wet Prep HPF POC NONE SEEN  NONE SEEN   WBC, Wet Prep HPF POC MANY (*) NONE SEEN  CBC     Status: None   Collection Time    06/18/13 10:26 AM      Result Value Range   WBC 6.4  4.0 - 10.5 K/uL   RBC 4.18  3.87 - 5.11 MIL/uL   Hemoglobin 12.3  12.0 - 15.0 g/dL   HCT 16.1  09.6 - 04.5 %   MCV 86.1  78.0 - 100.0 fL   MCH 29.4  26.0 - 34.0 pg   MCHC 34.2  30.0 - 36.0 g/dL   RDW 40.9  81.1 - 91.4 %   Platelets 231  150 - 400 K/uL  Toradol 60mg  IM given for pain with improvement in pain Assessment and Plan  Abdominal pain Cervicitis-  Prescription for zithromax 1 gm given to pt- pt unable to stay for medication in MAU- pt allergic to penicillin and sulfa drugs limiting options Yeast vaginitis- Diflucan 150mg  #2 UTI- Macrobid prescription F/u with Dr. Rubin Payor 06/18/2013, 9:37 AM

## 2013-06-18 NOTE — MAU Note (Signed)
Patient is in with c/o ongoing dysuria and vaginal irritation. She states that she completed the ABX given to her for UTI and have recently used left over rx clindamycin (had for tooth abscess) without relief. She is having flank tenderness. Denies bleeding at this time.

## 2013-06-18 NOTE — MAU Note (Signed)
Patient states she has had pain with urination for 3 weeks. Tried OTC medication and left over Clindamycin but is not working. Having lower abdominal pain with urinary frequency.

## 2013-06-19 LAB — URINE CULTURE: Colony Count: >=100000

## 2013-07-22 ENCOUNTER — Emergency Department (HOSPITAL_COMMUNITY)
Admission: EM | Admit: 2013-07-22 | Discharge: 2013-07-23 | Disposition: A | Discharge status: Home / Self Care | Payer: Self-pay | Attending: Emergency Medicine | Admitting: Emergency Medicine

## 2013-07-22 ENCOUNTER — Encounter (HOSPITAL_COMMUNITY): Payer: Self-pay | Admitting: *Deleted

## 2013-07-22 DIAGNOSIS — Z791 Long term (current) use of non-steroidal anti-inflammatories (NSAID): Secondary | ICD-10-CM | POA: Insufficient documentation

## 2013-07-22 DIAGNOSIS — Z880 Allergy status to penicillin: Secondary | ICD-10-CM | POA: Insufficient documentation

## 2013-07-22 DIAGNOSIS — G40909 Epilepsy, unspecified, not intractable, without status epilepticus: Secondary | ICD-10-CM | POA: Insufficient documentation

## 2013-07-22 DIAGNOSIS — J45909 Unspecified asthma, uncomplicated: Secondary | ICD-10-CM | POA: Insufficient documentation

## 2013-07-22 DIAGNOSIS — G43909 Migraine, unspecified, not intractable, without status migrainosus: Secondary | ICD-10-CM

## 2013-07-22 DIAGNOSIS — Z91199 Patient's noncompliance with other medical treatment and regimen due to unspecified reason: Secondary | ICD-10-CM | POA: Insufficient documentation

## 2013-07-22 DIAGNOSIS — Z862 Personal history of diseases of the blood and blood-forming organs and certain disorders involving the immune mechanism: Secondary | ICD-10-CM | POA: Insufficient documentation

## 2013-07-22 DIAGNOSIS — Z9114 Patient's other noncompliance with medication regimen: Secondary | ICD-10-CM

## 2013-07-22 DIAGNOSIS — Z87891 Personal history of nicotine dependence: Secondary | ICD-10-CM | POA: Insufficient documentation

## 2013-07-22 DIAGNOSIS — D573 Sickle-cell trait: Secondary | ICD-10-CM | POA: Insufficient documentation

## 2013-07-22 DIAGNOSIS — Z8679 Personal history of other diseases of the circulatory system: Secondary | ICD-10-CM | POA: Insufficient documentation

## 2013-07-22 DIAGNOSIS — Z79899 Other long term (current) drug therapy: Secondary | ICD-10-CM | POA: Insufficient documentation

## 2013-07-22 DIAGNOSIS — R51 Headache: Secondary | ICD-10-CM | POA: Insufficient documentation

## 2013-07-22 DIAGNOSIS — R569 Unspecified convulsions: Secondary | ICD-10-CM

## 2013-07-22 DIAGNOSIS — R5383 Other fatigue: Secondary | ICD-10-CM | POA: Insufficient documentation

## 2013-07-22 DIAGNOSIS — R5381 Other malaise: Secondary | ICD-10-CM | POA: Insufficient documentation

## 2013-07-22 DIAGNOSIS — Z9119 Patient's noncompliance with other medical treatment and regimen: Secondary | ICD-10-CM | POA: Insufficient documentation

## 2013-07-22 LAB — CBC
HCT: 34.3 % — ABNORMAL LOW (ref 36.0–46.0)
Hemoglobin: 11.9 g/dL — ABNORMAL LOW (ref 12.0–15.0)
RBC: 3.98 MIL/uL (ref 3.87–5.11)
WBC: 7.2 10*3/uL (ref 4.0–10.5)

## 2013-07-22 LAB — BASIC METABOLIC PANEL
BUN: 7 mg/dL (ref 6–23)
Chloride: 106 mEq/L (ref 96–112)
GFR calc non Af Amer: >90 mL/min (ref >90)
Glucose, Bld: 78 mg/dL (ref 70–99)
Potassium: 3.7 mEq/L (ref 3.5–5.1)
Sodium: 138 mEq/L (ref 135–145)

## 2013-07-22 MED ORDER — TOPIRAMATE 25 MG PO TABS
50.0000 mg | ORAL_TABLET | Freq: Once | ORAL | Status: AC
  Administered 2013-07-22: 50 mg via ORAL
  Filled 2013-07-22: qty 2

## 2013-07-22 MED ORDER — IBUPROFEN 800 MG PO TABS
800.0000 mg | ORAL_TABLET | Freq: Once | ORAL | Status: AC
Start: 2013-07-22 — End: 2013-07-22
  Administered 2013-07-22: 800 mg via ORAL
  Filled 2013-07-22: qty 1

## 2013-07-22 NOTE — ED Notes (Signed)
Pt reports that she has had heart fluttering and generalized weakness and not feeling right x 2 days; pt states that she has tried to work and push through it but states the more she does the worse she feels; pt describes the fluttering feeling that comes and goes; denies chest pain

## 2013-07-22 NOTE — ED Provider Notes (Signed)
CSN: [id number]     Arrival date & time 07/22/13  2114 History     First MD Initiated Contact with Patient 07/22/13 2126     Chief Complaint  Patient presents with  . Palpitations  . Weakness   (Consider location/radiation/quality/duration/timing/severity/associated sxs/prior Treatment) HPI Comments: 37 year old female with past medical history of seizures, migraines, anemia and asthma presents to the emergency department complaining of generalized weakness and heart palpitations over the past 2 days. Today she was at the store and "fell out", was awoken by a lady at the store. Prior to the fall, patient states she just felt "not right". Currently she is complaining of intermittent heart palpitations and a migraine headache. Tried taking her last hydrocodone without relief. She did not try taking her Topamax. She is concerned because she does not have any more hydrocodone at home and the Dr. who prescribed it, Dr. Thad Ranger is no longer at the neurology practice. She has not had an appointment with the new Dr. at [facility] neurology. Admits to noncompliance with her seizure medication, states she takes them at random on random days. States she does not like the way they make her feel and does not want to take them when she is working. Unsure if she had a seizure today. Denies photophobia, nausea, vomiting, fever, chills, chest pain, sob.   Patient is a 37 y.o. female presenting with palpitations and weakness. The history is provided by the patient.  Palpitations Associated symptoms: no chest pain, no dizziness, no nausea, no shortness of breath and no vomiting   Weakness Associated symptoms include headaches and weakness. Pertinent negatives include no chest pain, chills, fever, nausea, neck pain or vomiting.    Past Medical History  Diagnosis Date  . Anemia   . Asthma   . Blood transfusion 2011    r/t anemia  . Migraine   . Sickle cell trait   . Seizures     last sz Jan 2012  . Seizures    . Fibroid   . Tooth abscess    Past Surgical History  Procedure Laterality Date  . Tubal ligation Bilateral 2005   Family History  Problem Relation Age of Onset  . Anesthesia problems Neg Hx   . Hypertension Mother   . Diabetes Mother   . Cancer Mother   . Diabetes Father   . Diabetes Maternal Grandmother    History  Substance Use Topics  . Smoking status: Former Games developer  . Smokeless tobacco: Not on file  . Alcohol Use: No   OB History   Grav Para Term Preterm Abortions TAB SAB Ect Mult Living   3 2 1 1 1  0 0 1 0 2     Review of Systems  Constitutional: Negative for fever and chills.  HENT: Negative for neck pain and neck stiffness.   Eyes: Negative for visual disturbance.  Respiratory: Negative for shortness of breath.   Cardiovascular: Positive for palpitations. Negative for chest pain.  Gastrointestinal: Negative for nausea and vomiting.  Genitourinary: Negative for vaginal bleeding.  Neurological: Positive for seizures, weakness and headaches. Negative for dizziness and light-headedness.  Psychiatric/Behavioral: Negative for confusion.  All other systems reviewed and are negative.    Allergies  Penicillins and Sulfa antibiotics  Home Medications   Current Outpatient Rx  Name  Route  Sig  Dispense  Refill  . albuterol (PROVENTIL HFA;VENTOLIN HFA) 108 (90 BASE) MCG/ACT inhaler   Inhalation   Inhale 2 puffs into the lungs every 6 (six) hours  as needed. Shortness of breath          . carbamazepine (TEGRETOL) 200 MG tablet   Oral   Take 100 mg by mouth 2 (two) times daily.          . diclofenac (VOLTAREN) 75 MG EC tablet   Oral   Take 75 mg by mouth 2 (two) times daily with a meal.         . ibuprofen (ADVIL,MOTRIN) 800 MG tablet   Oral   Take 800 mg by mouth 3 (three) times daily. pain         . Iron-FA-B Cmp-C-Biot-Probiotic (FUSION PLUS PO)   Oral   Take 1 tablet by mouth daily with breakfast.         . phenytoin (DILANTIN) 100 MG ER  capsule   Oral   Take 100 mg by mouth 4 (four) times daily.          Marland Kitchen topiramate (TOPAMAX) 50 MG tablet   Oral   Take 50 mg by mouth 2 (two) times daily.           . traMADol (ULTRAM) 50 MG tablet   Oral   Take 50 mg by mouth every 6 (six) hours as needed for pain.          BP 122/77  Pulse 96  Temp(Src) 98.5 F (36.9 C) (Oral)  Resp 18  Ht 5\' 5"  (1.651 m)  Wt 220 lb (99.791 kg)  BMI 36.61 kg/m2  SpO2 99% Physical Exam  Nursing note and vitals reviewed. Constitutional: She is oriented to person, place, and time. She appears well-developed and well-nourished. No distress.  HENT:  Head: Normocephalic and atraumatic.  Mouth/Throat: Oropharynx is clear and moist.  Eyes: Conjunctivae and EOM are normal. Pupils are equal, round, and reactive to light.  Neck: Normal range of motion. Neck supple.  Cardiovascular: Normal rate, regular rhythm, normal heart sounds and intact distal pulses.   Pulmonary/Chest: Effort normal and breath sounds normal. No respiratory distress. She has no wheezes. She has no rales. She exhibits no tenderness.  Abdominal: Soft. Bowel sounds are normal. There is no tenderness.  Musculoskeletal: Normal range of motion. She exhibits no edema.  Neurological: She is alert and oriented to person, place, and time. She has normal strength. No cranial nerve deficit or sensory deficit. She displays a negative Romberg sign. GCS eye subscore is 4. GCS verbal subscore is 5. GCS motor subscore is 6.  Skin: Skin is warm and dry. She is not diaphoretic.  Psychiatric: She has a normal mood and affect. Her behavior is normal.    ED Course   Procedures (including critical care time)  Labs Reviewed  CBC  BASIC METABOLIC PANEL  PHENYTOIN LEVEL, TOTAL    Date: 07/22/2013  Rate: 83  Rhythm: normal sinus rhythm  QRS Axis: normal  Intervals: normal  ST/T Wave abnormalities: normal  Conduction Disutrbances:none  Narrative Interpretation: normal EKG  Old EKG  Reviewed: unchanged   No results found. 1. Seizures   2. Migraine   3. Non compliance w medication regimen     MDM  Patient with syncopal episode, migraine, hx of seizures. Non-compliant with seizure meds. Neuro exam along with rest of PE unremarkable. Took last hydrocodone today. Did not try taking her topamax for migraines. EKG normal. Labs pending- cbc, bmp, dilantin level. Topamax and ibuprofen for migraine. Vitals unremarkable. Discussed importance of medication compliance.  Patient care assumed by PA Laveda Norman at shift change, EP[employer] downtime. Dilantin  level pending- dispo depending on level, appears level was low, received IV loading dose.   Trevor Mace, PA-C 07/25/13 [phone]

## 2013-07-23 ENCOUNTER — Encounter (HOSPITAL_COMMUNITY): Payer: Self-pay

## 2013-07-23 MED ORDER — SODIUM CHLORIDE 0.9 % IV SOLN
1000.0000 mg | Freq: Once | INTRAVENOUS | Status: AC
  Administered 2013-07-23: 1000 mg via INTRAVENOUS
  Filled 2013-07-23: qty 20

## 2013-07-23 NOTE — ED Provider Notes (Signed)
Received signed from PA.  Pt is here for evaluation of unwitnessed syncope.  She has hx of seizure, taking dilantin.  It was signed out to me that pt will need to have dilantin level check, if subtherapeutic then can give IV loading dose and then pt will be discharged.   2:16 AM Dilantin level subtherapeutic <2.5.  Will load with Dilantin 1g via IV.   4:04 AM Patient has received her loading dose of Dilantin. Patient is aware that she needs take her Dilantin as prescribed, due to increased risk of developing seizure when it is not therapeutic. Patient also agrees to followup with her neurologist for further management. Patient is currently stable for discharge. Return precautions discussed.  BP 97/53  Pulse 81  Temp(Src) 98.5 F (36.9 C) (Oral)  Resp 17  Ht 5\' 5"  (1.651 m)  Wt 220 lb (99.791 kg)  BMI 36.61 kg/m2  SpO2 98%  I have reviewed nursing notes and vital signs. I personally reviewed the imaging tests through PACS system  I reviewed available ER/hospitalization records thought the EMR  Results for orders placed during the hospital encounter of 07/22/13  CBC      Result Value Range   WBC 7.2  4.0 - 10.5 K/uL   RBC 3.98  3.87 - 5.11 MIL/uL   Hemoglobin 11.9 (*) 12.0 - 15.0 g/dL   HCT 04.5 (*) 40.9 - 81.1 %   MCV 86.2  78.0 - 100.0 fL   MCH 29.9  26.0 - 34.0 pg   MCHC 34.7  30.0 - 36.0 g/dL   RDW 91.4  78.2 - 95.6 %   Platelets 277  150 - 400 K/uL  BASIC METABOLIC PANEL      Result Value Range   Sodium 138  135 - 145 mEq/L   Potassium 3.7  3.5 - 5.1 mEq/L   Chloride 106  96 - 112 mEq/L   CO2 24  19 - 32 mEq/L   Glucose, Bld 78  70 - 99 mg/dL   BUN 7  6 - 23 mg/dL   Creatinine, Ser 2.13  0.50 - 1.10 mg/dL   Calcium 9.2  8.4 - 08.6 mg/dL   GFR calc non Af Amer >90  >90 mL/min   GFR calc Af Amer >90  >90 mL/min  PHENYTOIN LEVEL, TOTAL      Result Value Range   Phenytoin Lvl <2.5 (*) 10.0 - 20.0 ug/mL   No results found.    Fayrene Helper, PA-C 07/23/13 [phone]

## 2013-07-23 NOTE — ED Provider Notes (Signed)
Medical screening examination/treatment/procedure(s) were performed by non-physician practitioner and as supervising physician I was immediately available for consultation/collaboration.  John-Adam Mrk Buzby, M.D.     John-Adam Tanyia Grabbe, MD 07/23/13 0600 

## 2013-07-25 NOTE — ED Provider Notes (Signed)
Medical screening examination/treatment/procedure(s) were performed by non-physician practitioner and as supervising physician I was immediately available for consultation/collaboration.   Chrishun Scheer Joseph Niylah Hassan, MD 07/25/13 1052 

## 2013-08-14 IMAGING — CR DG CHEST 2V
2 series · 2 of 2 positions shown · non-contrast
Comparison: 10/30/2012

CLINICAL DATA: Chest pain.  Nonsmoker.

CHEST - 2 VIEW

[w chest pa]
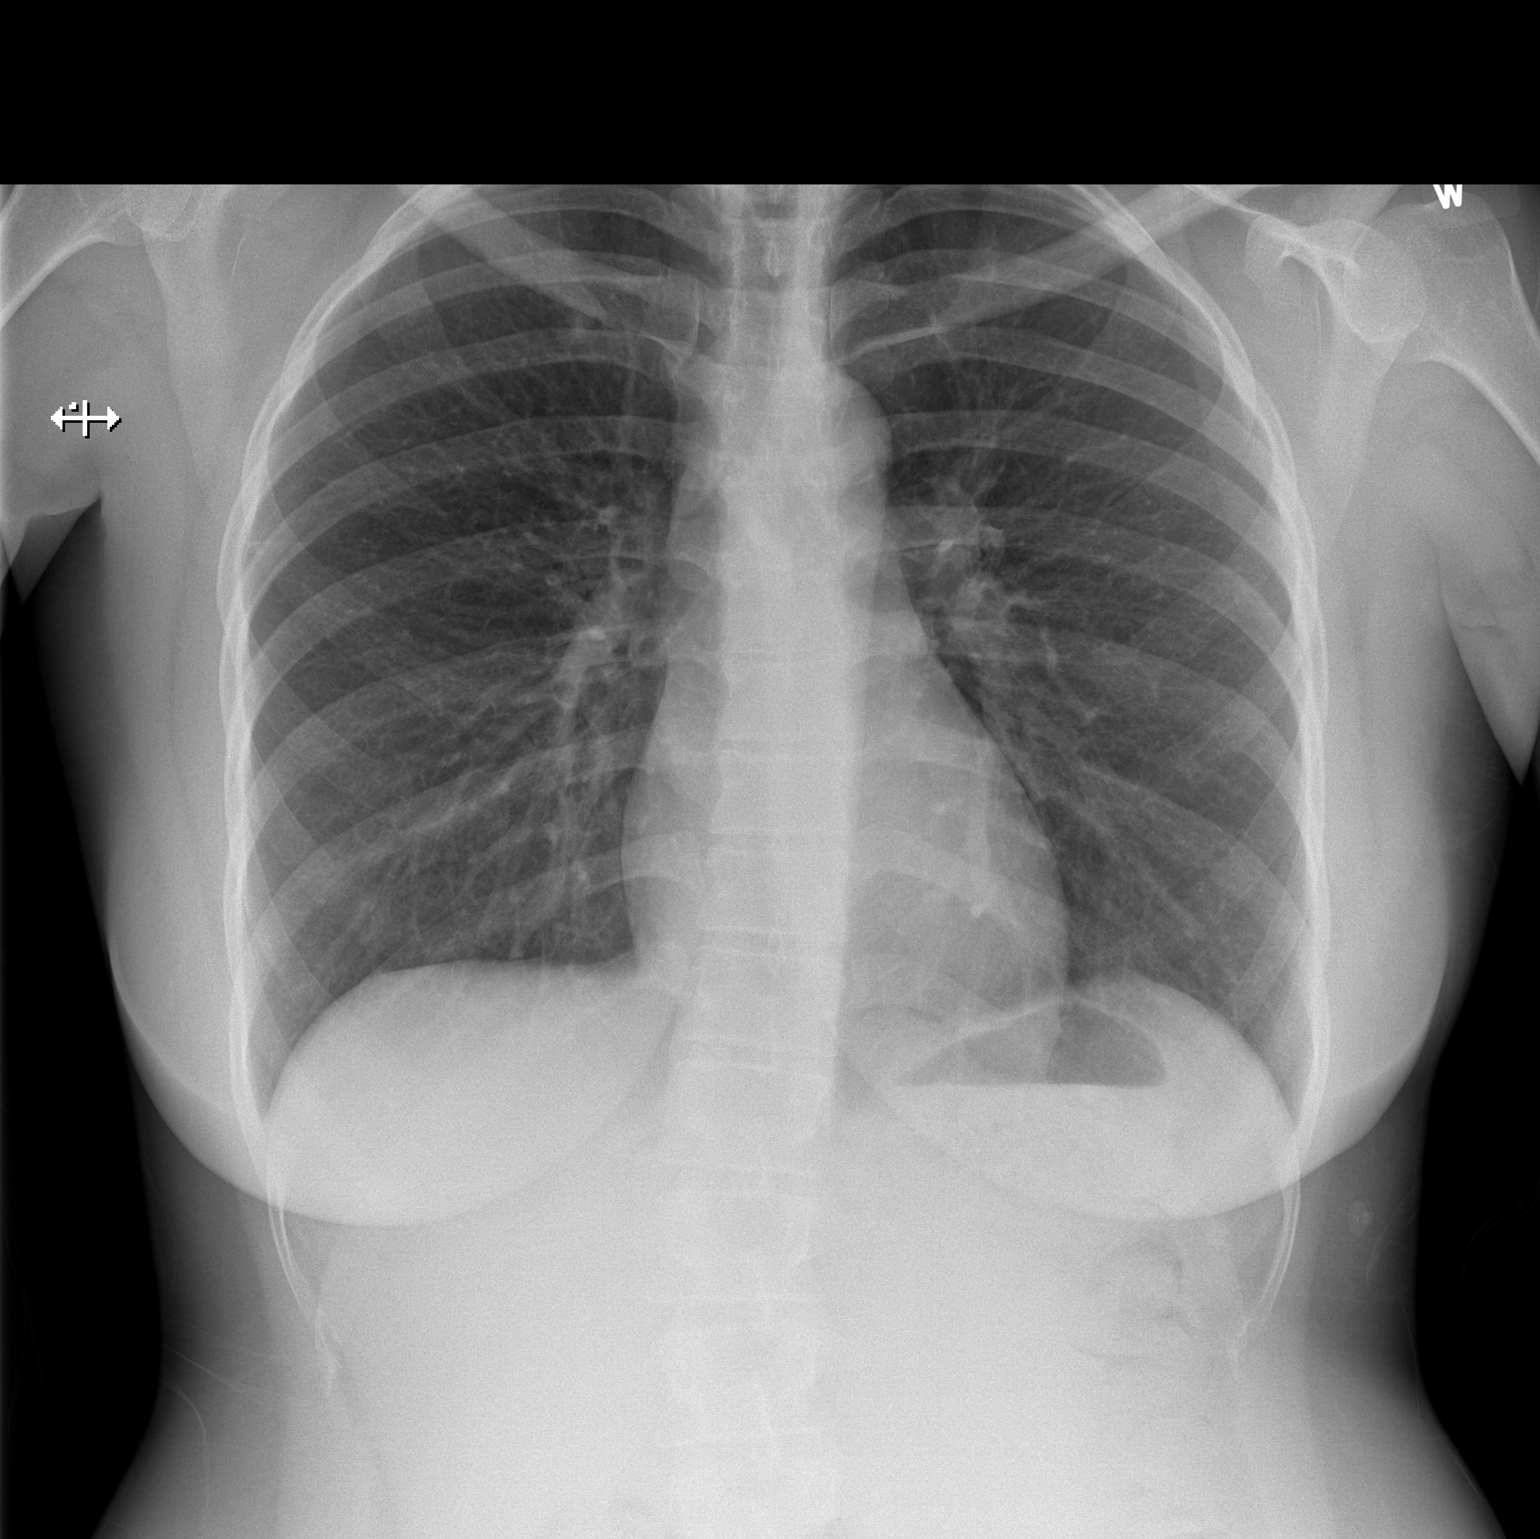

[w chest lat]
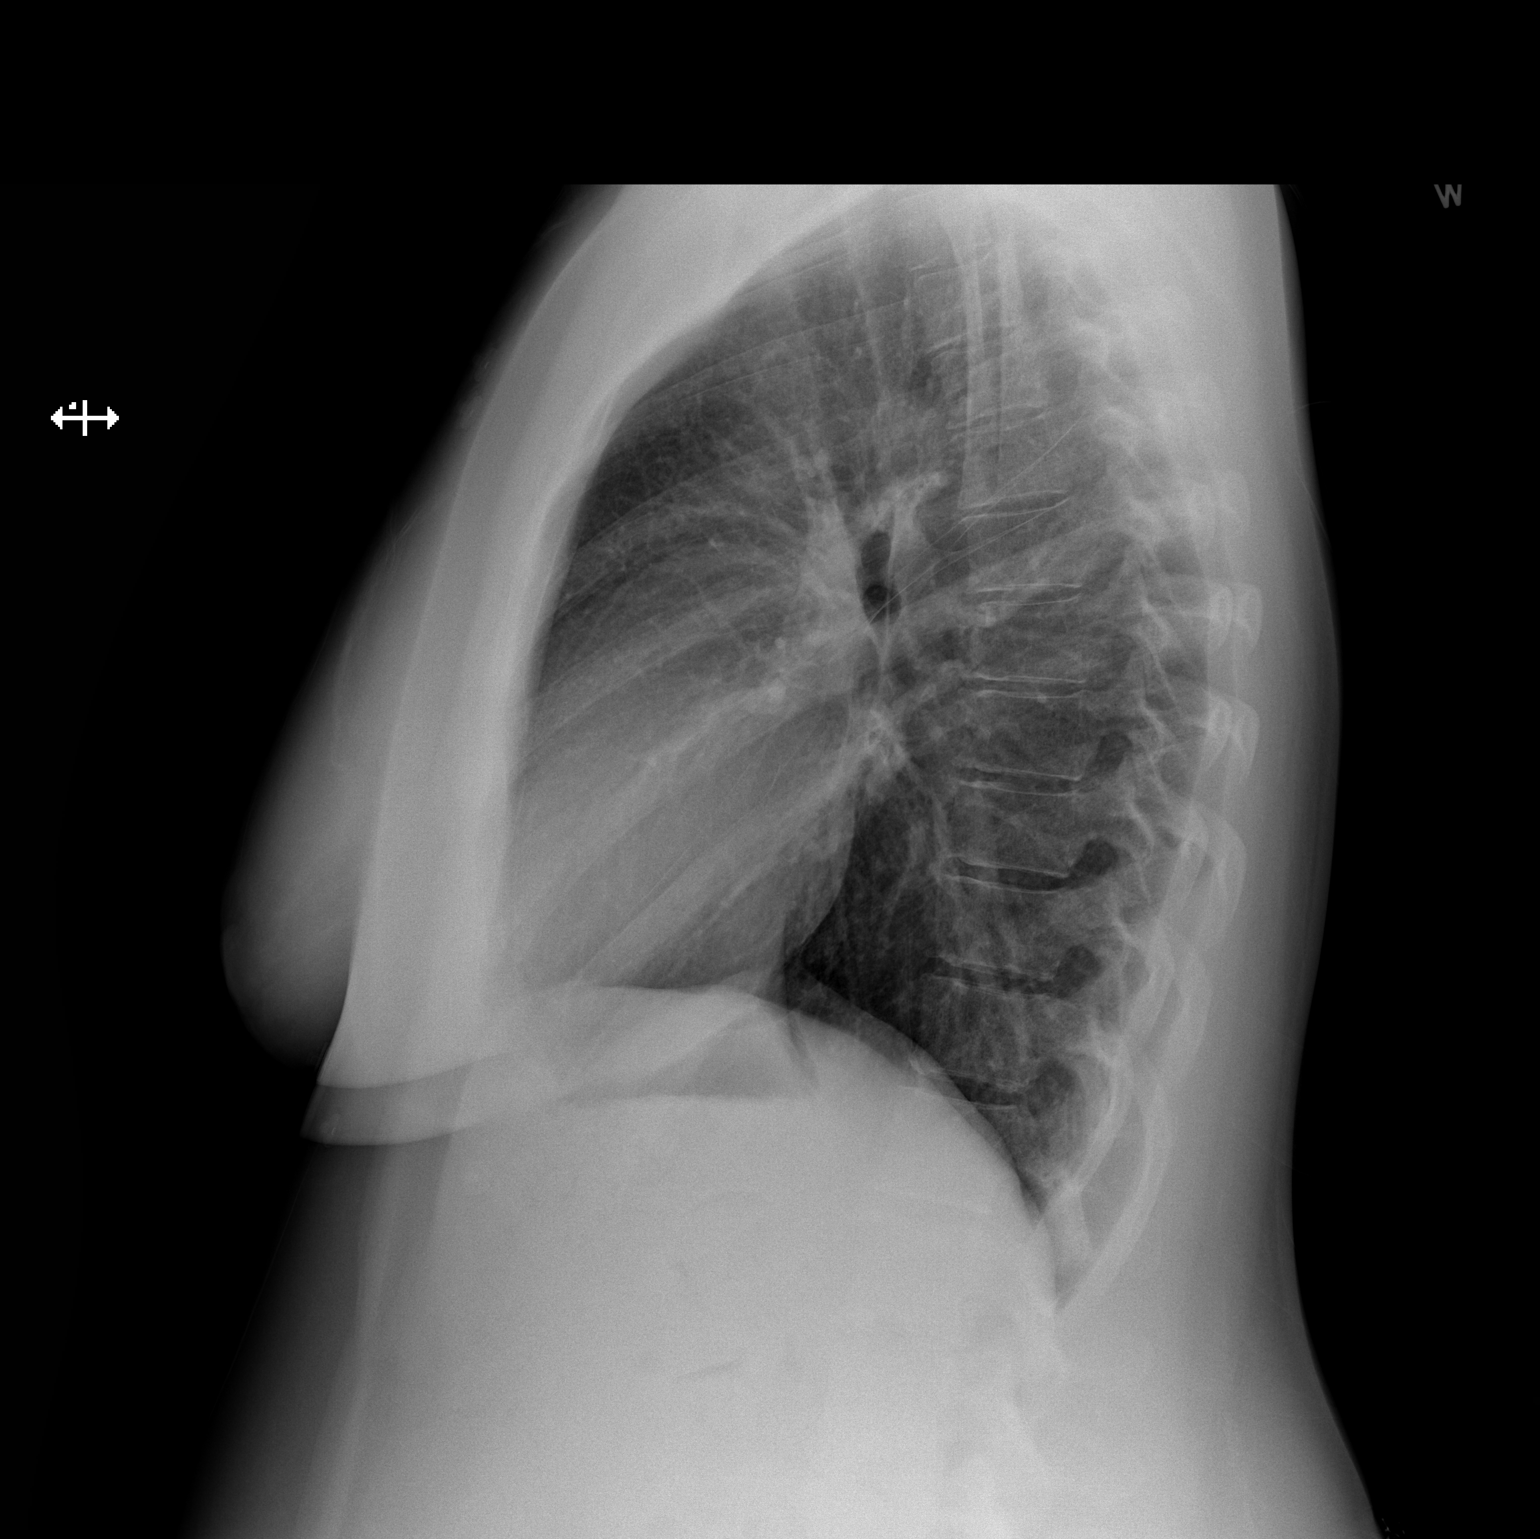

[2 of 2 positions shown; findings below may reference images not displayed]

FINDINGS: Midline trachea.  Normal heart size and mediastinal
contours. No pleural effusion or pneumothorax.  Clear lungs.

Minimal S-shaped thoracolumbar spine curvature.
IMPRESSION: Normal chest.

## 2013-09-12 ENCOUNTER — Emergency Department (HOSPITAL_COMMUNITY)
Admission: EM | Admit: 2013-09-12 | Discharge: 2013-09-12 | Disposition: A | Discharge status: Home / Self Care | Payer: BC Managed Care – PPO | Attending: Emergency Medicine | Admitting: Emergency Medicine

## 2013-09-12 ENCOUNTER — Emergency Department (HOSPITAL_COMMUNITY): Payer: BC Managed Care – PPO

## 2013-09-12 ENCOUNTER — Encounter (HOSPITAL_COMMUNITY): Payer: Self-pay | Admitting: Emergency Medicine

## 2013-09-12 DIAGNOSIS — Z79899 Other long term (current) drug therapy: Secondary | ICD-10-CM | POA: Insufficient documentation

## 2013-09-12 DIAGNOSIS — M25561 Pain in right knee: Secondary | ICD-10-CM

## 2013-09-12 DIAGNOSIS — M25569 Pain in unspecified knee: Secondary | ICD-10-CM | POA: Insufficient documentation

## 2013-09-12 DIAGNOSIS — Z880 Allergy status to penicillin: Secondary | ICD-10-CM | POA: Insufficient documentation

## 2013-09-12 DIAGNOSIS — Z862 Personal history of diseases of the blood and blood-forming organs and certain disorders involving the immune mechanism: Secondary | ICD-10-CM | POA: Insufficient documentation

## 2013-09-12 DIAGNOSIS — G43909 Migraine, unspecified, not intractable, without status migrainosus: Secondary | ICD-10-CM | POA: Insufficient documentation

## 2013-09-12 DIAGNOSIS — Z8719 Personal history of other diseases of the digestive system: Secondary | ICD-10-CM | POA: Insufficient documentation

## 2013-09-12 DIAGNOSIS — Z87891 Personal history of nicotine dependence: Secondary | ICD-10-CM | POA: Insufficient documentation

## 2013-09-12 DIAGNOSIS — J45909 Unspecified asthma, uncomplicated: Secondary | ICD-10-CM | POA: Insufficient documentation

## 2013-09-12 DIAGNOSIS — G40909 Epilepsy, unspecified, not intractable, without status epilepticus: Secondary | ICD-10-CM | POA: Insufficient documentation

## 2013-09-12 DIAGNOSIS — Z8742 Personal history of other diseases of the female genital tract: Secondary | ICD-10-CM | POA: Insufficient documentation

## 2013-09-12 MED ORDER — ACETAMINOPHEN 325 MG PO TABS
650.0000 mg | ORAL_TABLET | Freq: Once | ORAL | Status: AC
  Administered 2013-09-12: 650 mg via ORAL
  Filled 2013-09-12: qty 2

## 2013-09-12 NOTE — ED Provider Notes (Signed)
CSN: [id number]     Arrival date & time 09/12/13  1243 History  This chart was scribed for non-physician practitioner, Luiz Iron, PA-C ,working with Shanna Cisco, MD, by Karle Plumber, ED Scribe.  This patient was seen in room WTR5/WTR5 and the patient's care was started at 1:08 PM.   Chief Complaint  Patient presents with  . Knee Pain   The history is provided by the patient. No language interpreter was used.   HPI Comments:  Nicole [name] is a 37 y.o. female with a past medical history of edema, asthma, migraine headaches, and seizures who presents to the Emergency Department complaining of knee pain x 1 week.  Her pain is constant, describes as an aching sensation, and located in her right knee diffusely. She reports associated swelling. She has been able to ambulate with no issue, however this worsens her pain. She reports taking Motrin approximately 2 hours ago and wrapping with an ace wrap with no relief from either. Pt denies current numbness or tingling, but states she experiences this at times. Pt denies any recent trauma or recent injury. She denies any weakness, loss of sensation, calf tenderness or edema. She states that she's had fluid drained from her right knee in the past however denies any fractures or surgeries. She reports unassociated head congestion, sneezing, and headache secondary to a sinus infection. She otherwise is a well with no fever, chills, cough, chest pain, shortness of breath, abdominal pain, nausea, vomiting, diarrhea, constipation, or dysuria.    Past Medical History  Diagnosis Date  . Anemia   . Asthma   . Blood transfusion 2011    r/t anemia  . Migraine   . Sickle cell trait   . Seizures     last sz Jan 2012  . Seizures   . Fibroid   . Tooth abscess    Past Surgical History  Procedure Laterality Date  . Tubal ligation Bilateral 2005   Family History  Problem Relation Age of Onset  . Anesthesia problems Neg Hx   . Hypertension  Mother   . Diabetes Mother   . Cancer Mother   . Diabetes Father   . Diabetes Maternal Grandmother    History  Substance Use Topics  . Smoking status: Former Games developer  . Smokeless tobacco: Not on file  . Alcohol Use: No   OB History   Grav Para Term Preterm Abortions TAB SAB Ect Mult Living   3 2 1 1 1  0 0 1 0 2     Review of Systems  Constitutional: Negative for fever, chills, activity change, appetite change and fatigue.  HENT: Positive for congestion, rhinorrhea, sneezing and sinus pressure. Negative for ear pain, sore throat, neck pain, neck stiffness and voice change.   Eyes: Negative for visual disturbance.  Respiratory: Negative for cough, shortness of breath and wheezing.   Cardiovascular: Negative for chest pain and leg swelling.  Gastrointestinal: Negative for nausea, vomiting, abdominal pain, diarrhea and constipation.  Genitourinary: Negative for dysuria.  Musculoskeletal: Positive for joint swelling and arthralgias. Negative for myalgias, back pain and gait problem.       Right knee pain.  Skin: Negative for color change, rash and wound.  Neurological: Positive for headaches. Negative for dizziness, syncope, weakness and light-headedness.  Psychiatric/Behavioral: Negative for confusion.  All other systems reviewed and are negative.   Allergies  Penicillins and Sulfa antibiotics  Home Medications   Current Outpatient Rx  Name  Route  Sig  Dispense  Refill  . albuterol (PROVENTIL HFA;VENTOLIN HFA) 108 (90 BASE) MCG/ACT inhaler   Inhalation   Inhale 2 puffs into the lungs every 6 (six) hours as needed. Shortness of breath          . carbamazepine (TEGRETOL) 200 MG tablet   Oral   Take 100 mg by mouth 2 (two) times daily.          . diclofenac (VOLTAREN) 75 MG EC tablet   Oral   Take 75 mg by mouth 2 (two) times daily with a meal.         . ibuprofen (ADVIL,MOTRIN) 600 MG tablet   Oral   Take 600 mg by mouth every 6 (six) hours as needed for pain.          . medroxyPROGESTERone (DEPO-PROVERA) 150 MG/ML injection   Intramuscular   Inject 150 mg into the muscle every 3 (three) months.         . phenytoin (DILANTIN) 100 MG ER capsule   Oral   Take 100 mg by mouth 4 (four) times daily.          Marland Kitchen topiramate (TOPAMAX) 50 MG tablet   Oral   Take 50 mg by mouth 2 (two) times daily.           . traMADol (ULTRAM) 50 MG tablet   Oral   Take 50 mg by mouth every 6 (six) hours as needed for pain.          Triage Vitals: BP 109/73  Pulse 69  Temp(Src) 98.2 F (36.8 C) (Oral)  Resp 18  SpO2 99%  Filed Vitals:   09/12/13 1248 09/12/13 1258 09/12/13 1446  BP: 109/73  110/76  Pulse: 69  62  Temp: 98.2 F (36.8 C)  97.9 F (36.6 C)  TempSrc: Oral  Oral  Resp: 18  18  SpO2: 98% 99% 100%     Physical Exam  Nursing note and vitals reviewed. Constitutional: She is oriented to person, place, and time. She appears well-developed and well-nourished.  HENT:  Head: Normocephalic and atraumatic.  Right Ear: External ear normal.  Left Ear: External ear normal.  Nose: Nose normal.  Mouth/Throat: Oropharynx is clear and moist.  Rhinorrhea  Eyes: Conjunctivae are normal. Pupils are equal, round, and reactive to light. Right eye exhibits no discharge. Left eye exhibits no discharge.  Neck: Normal range of motion. Neck supple.  Cardiovascular: Normal rate, regular rhythm, normal heart sounds and intact distal pulses.  Exam reveals no gallop and no friction rub.   No murmur heard. Dorsalis pedis pulses present and equal bilaterally  Pulmonary/Chest: Effort normal and breath sounds normal. No respiratory distress. She has no wheezes. She has no rales. She exhibits no tenderness.  Abdominal: Soft. Bowel sounds are normal. She exhibits no distension and no mass. There is no tenderness. There is no rebound and no guarding.  Musculoskeletal: She exhibits no edema and no tenderness.  Tenderness to palpation to the right knee diffusely.  Increased tenderness to palpation in the anterior inferior joint lines of the right knee. Patient able to flex and extend the knees bilaterally however reports increased pain on the right. No edema or erythema to the knees bilaterally. No tenderness to palpation to the ankles or hips bilaterally. Patient able to circumduct ankles and flex and extend hips without difficulty. No calf tenderness or leg edema bilaterally. Patient able to ambulate without difficulty or ataxia  Neurological: She is alert and oriented to person,  place, and time.  Gross sensation intact in the lower extremities  Skin: Skin is warm and dry.  Psychiatric: She has a normal mood and affect.    ED Course  Procedures (including critical care time) DIAGNOSTIC STUDIES: Oxygen Saturation is 99% on RA, normal by my interpretation.   COORDINATION OF CARE: 1:12 PM- Will obtain an X-Ray and give Tylenol for pain. Pt verbalizes understanding and agrees to plan.  Medications  acetaminophen (TYLENOL) tablet 650 mg (not administered)   Labs Review Labs Reviewed - No data to display Imaging Review Dg Knee Complete 4 Views Right  09/12/2013   CLINICAL DATA:  Right knee pain.  EXAM: RIGHT KNEE - COMPLETE 4+ VIEW  COMPARISON:  None.  FINDINGS: There is no evidence of fracture, dislocation, or joint effusion. There is no evidence of arthropathy or other focal bone abnormality. Soft tissues are unremarkable.  IMPRESSION: Negative.   Electronically Signed   By: Irish Lack M.D.   On: 09/12/2013 13:49       DG Knee Complete 4 Views Right (Final result)  Result time: 09/12/13 13:49:40    Final result by Rad Results In Interface (09/12/13 13:49:40)    Narrative:   CLINICAL DATA: Right knee pain.  EXAM: RIGHT KNEE - COMPLETE 4+ VIEW  COMPARISON: None.  FINDINGS: There is no evidence of fracture, dislocation, or joint effusion. There is no evidence of arthropathy or other focal bone abnormality. Soft tissues are  unremarkable.  IMPRESSION: Negative.   Electronically Signed By: Irish Lack M.D. On: 09/12/2013 13:49         MDM   1. Knee pain, right     LAJOYA DOMBEK is a 37 y.o. female with a past medical history of edema, asthma, migraine headaches, and seizures who presents to the Emergency Department complaining of knee pain x 1 week. Tylenol ordered for pain. X-rays of right knee ordered, however, there is no hx of trauma/injury.   Rechecks  2:34 PM = Pt reports pain as 4/10 after Tylenol. Informed pt X-Ray was normal.   Etiology of knee pain is unclear, however, is likely musculoskeletal in nature.  X-rays are negative for fracture or dislocation. I do not suspect an infectious etiology.  Patient had no evidence of edema or erythema to the knee joints bilaterally. She was neurovascularly intact. She is afebrile remained in no acute distress. Her pain improved her Tylenol in the emergency department. Patient was instructed to rest, ice, elevate, take ibuprofen or Tylenol for pain. Patient was instructed to followup with her primary care provider if her symptoms are not improving or worsening.  Patient was instructed to return to the ED if they experience any increased redness, swelling, fever, weakness, loss of sensation or other concerns.  Patient was in agreement with discharge and plan.     Final impressions: 1. Knee pain right    Luiz Iron PA-C    I personally performed the services described in this documentation, which was scribed in my presence. The recorded information has been reviewed and is accurate.     Jillyn Ledger, PA-C 09/12/13 [phone]

## 2013-09-12 NOTE — Progress Notes (Signed)
[facility] provided pt with a list of primary care resources and a [facility] [employer] [facility] application.  °

## 2013-09-12 NOTE — ED Notes (Signed)
Pt reports 8/10 right knee pain that began one week ago. Pt also reports swelling to the right knee. Pt denies injury or trauma to the extremity. Pt reports that the right leg often "gives out" on her while ambulating. Pt is A/O x4 and in  NAD.

## 2013-09-12 NOTE — ED Provider Notes (Signed)
Medical screening examination/treatment/procedure(s) were performed by non-physician practitioner and as supervising physician I was immediately available for consultation/collaboration.   Shanna Cisco, MD 09/12/13 [phone]

## 2013-10-15 IMAGING — CT CT HEAD W/O CM
2 series · 17 of 30 positions shown, 20 images · non-contrast
Comparison: 08/09/2012

CLINICAL DATA: Seizures.  History seizures.  Lightheadedness.

CT HEAD WITHOUT CONTRAST
TECHNIQUE: Contiguous axial images were obtained from the base of
the skull through the vertex without contrast.

[Series 2: head w/o · axial · non-contrast · 0.43mm/px · z∈[-146,-36]mm · 9 of 28 slices shown, 12 images]
[im 3/28  brain]
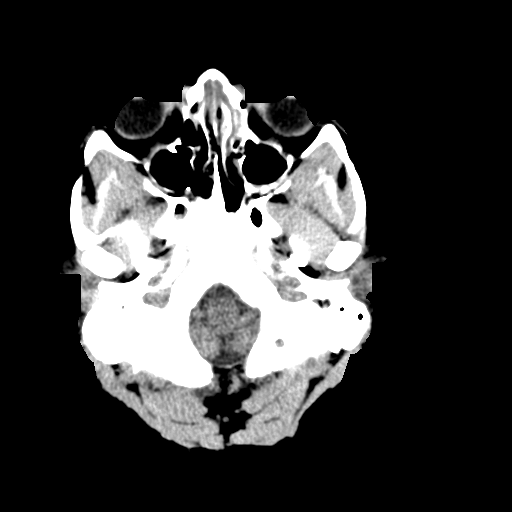
[im 3/28  bone]
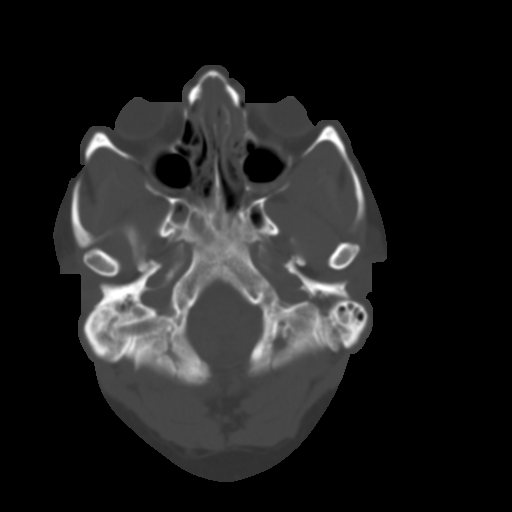
[im 6/28  brain]
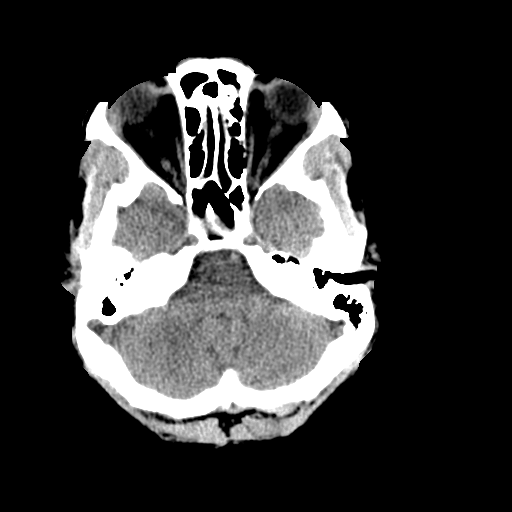
[im 9/28  brain]
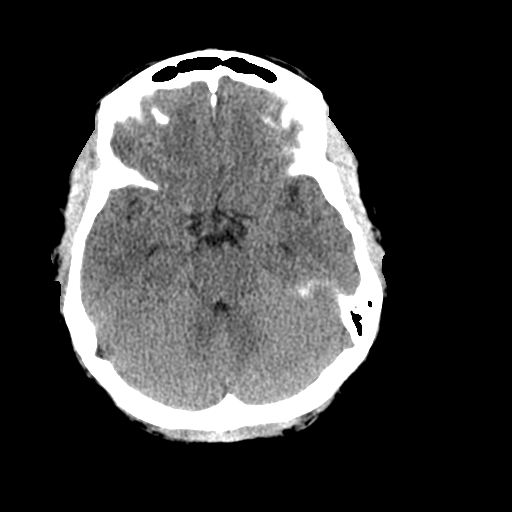
[im 11/28  brain]
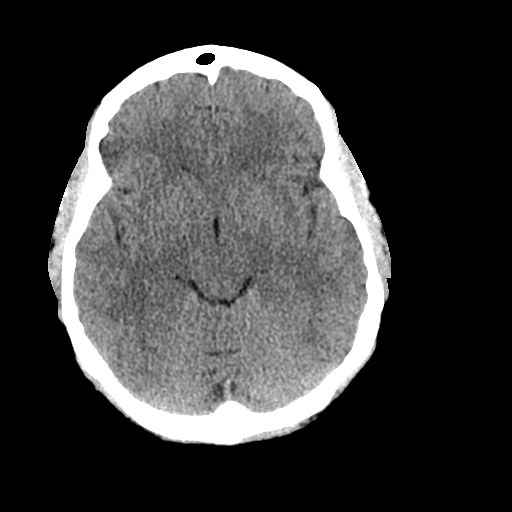
[im 14/28  brain]
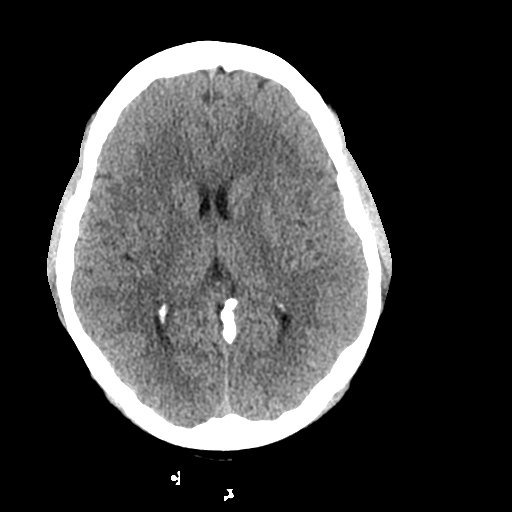
[im 14/28  bone]
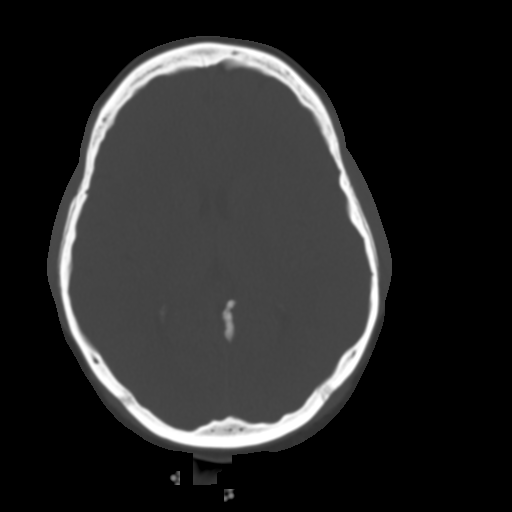
[im 17/28  brain]
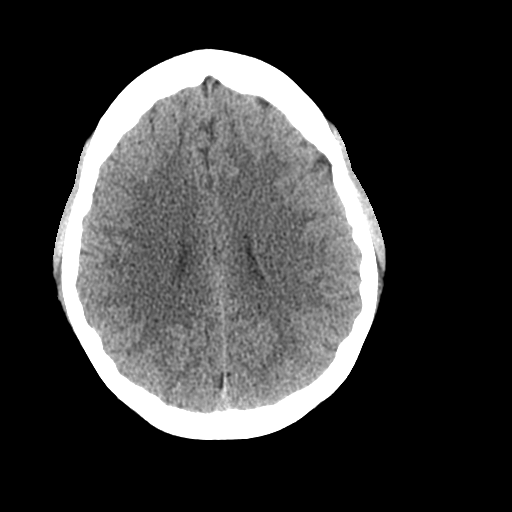
[im 19/28  brain]
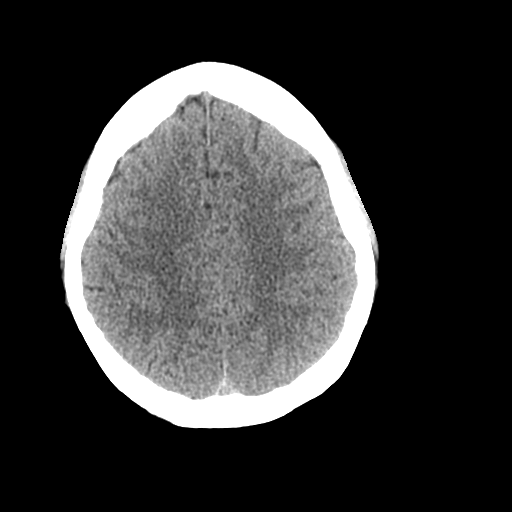
[im 22/28  brain]
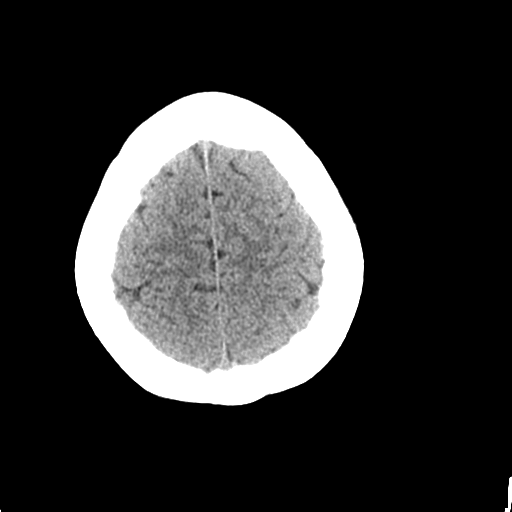
[im 25/28  brain]
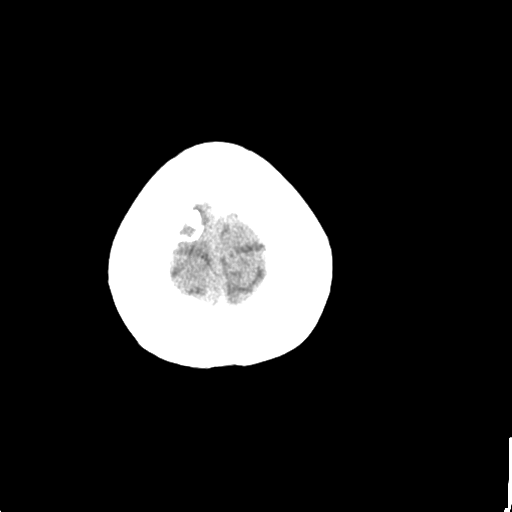
[im 25/28  bone]
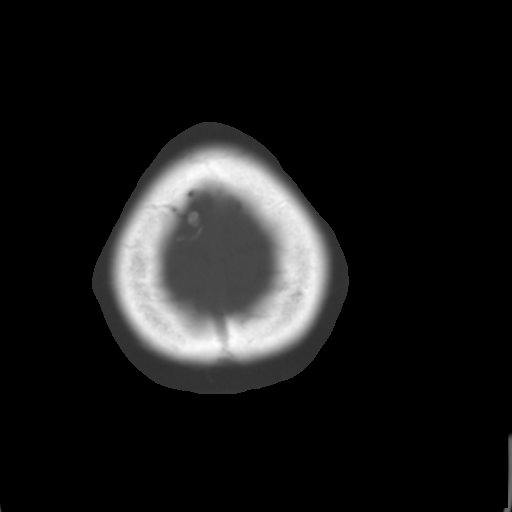

[Series 3: bone windows · axial · 0.43mm/px · z∈[-141,-36]mm · 8 of 46 slices shown]
[im 6/46  bone]
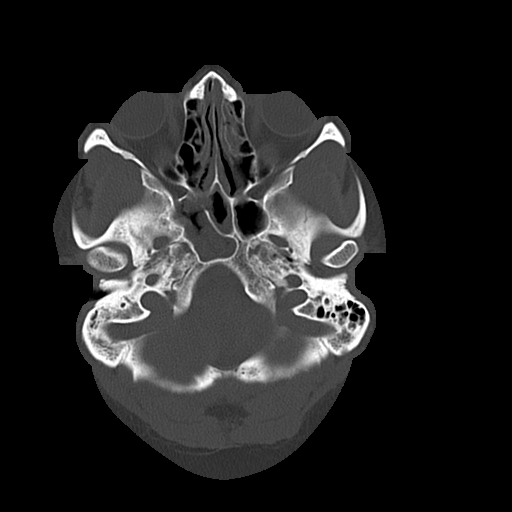
[im 11/46  bone]
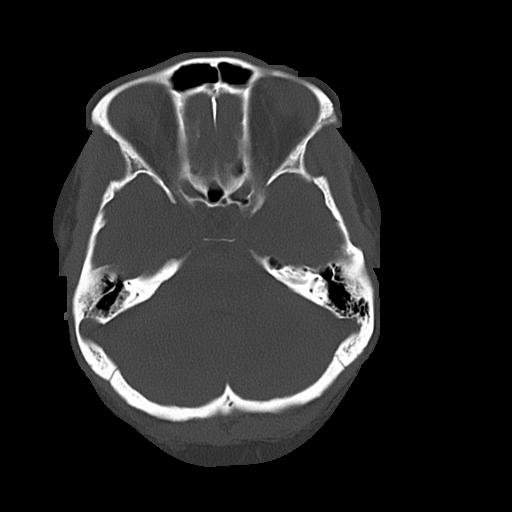
[im 16/46  bone]
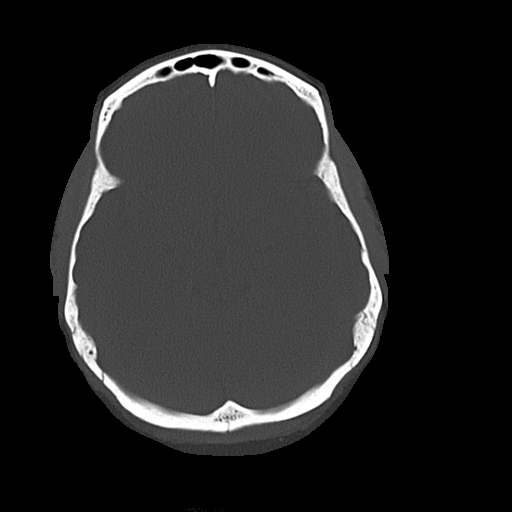
[im 21/46  bone]
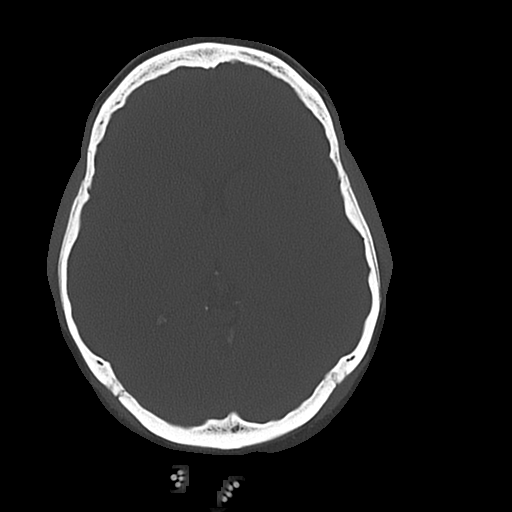
[im 26/46  bone]
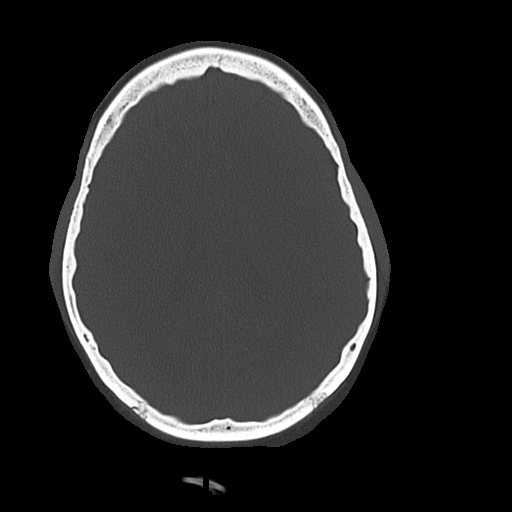
[im 31/46  bone]
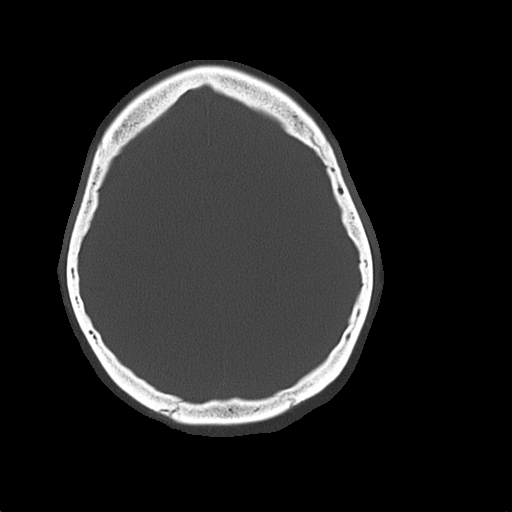
[im 36/46  bone]
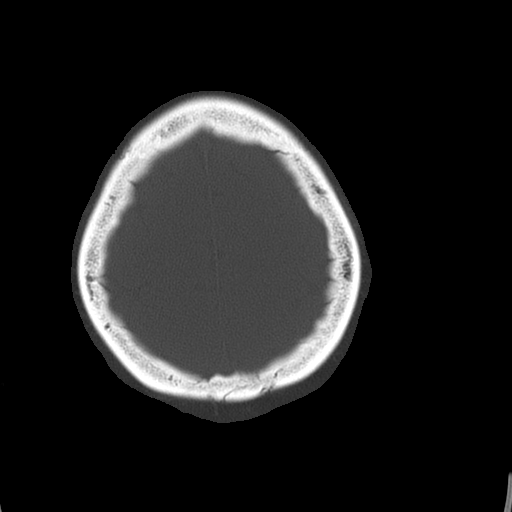
[im 41/46  bone]
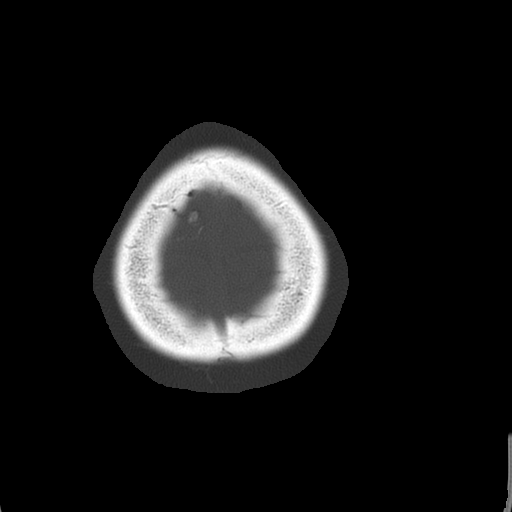

[17 of 30 positions shown; findings below may reference images not displayed]

FINDINGS: Bone windows demonstrate mucosal thickening of and fluid
within the left maxillary sinus, new.  Ethmoid air cell mucosal
thickening is slightly improved.  New sphenoid sinus mucosal
thickening.  Hypoplastic right frontal sinus with minimal fluid
within on image 9/series 3.

Soft tissue windows demonstrate no  mass lesion, hemorrhage,
hydrocephalus, acute infarct, intra-axial, or extra-axial fluid
collection.
IMPRESSION: 1. No acute intracranial abnormality.
2.  Sinus disease, slightly increased since 08/09/2012.
[DATE].  Trace fluid in the right mastoid air cells, which appear
hypoplastic.

## 2013-10-19 IMAGING — US US TRANSVAGINAL NON-OB
1 series · 13 of 25 positions shown · non-contrast
Comparison: 02/12/2012

CLINICAL DATA: Abnormal uterine bleeding.  Fibroids.  Anemia.



[Series 1: us pelvis complete · 60 acquisitions, 13 frames shown]
[im 1/60]
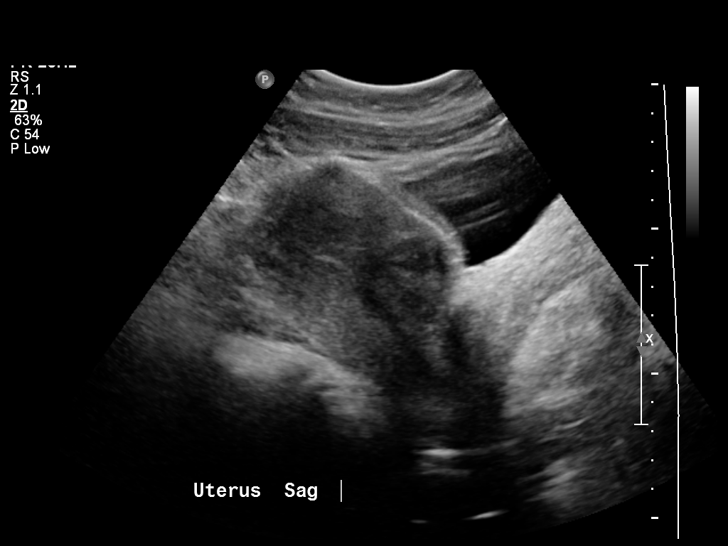
[im 5/60]
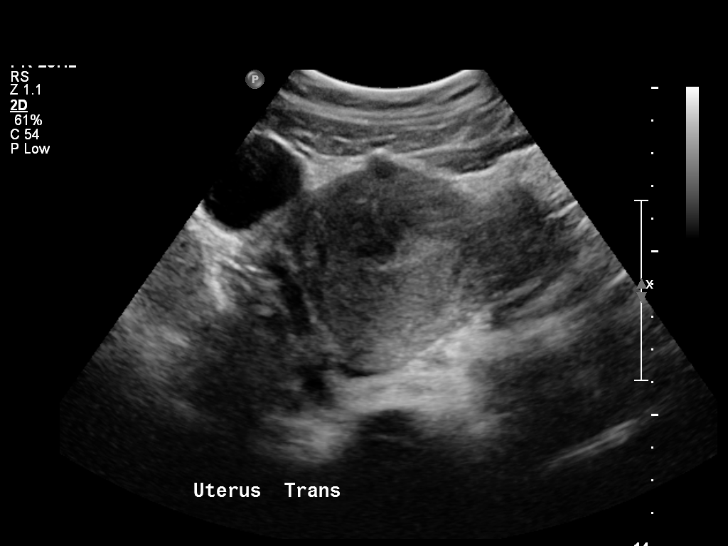
[im 10/60]
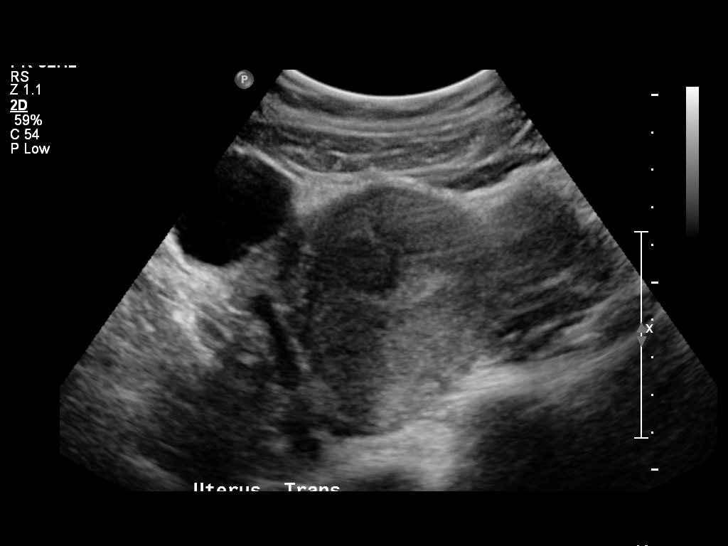
[im 15/60]
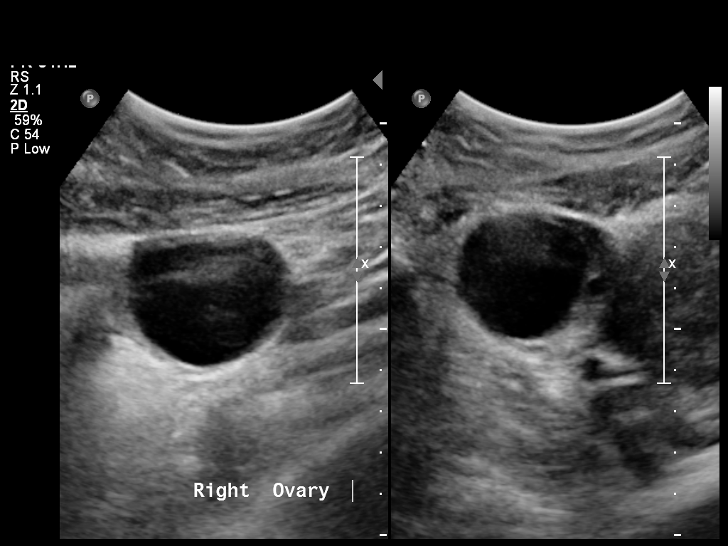
[im 20/60]
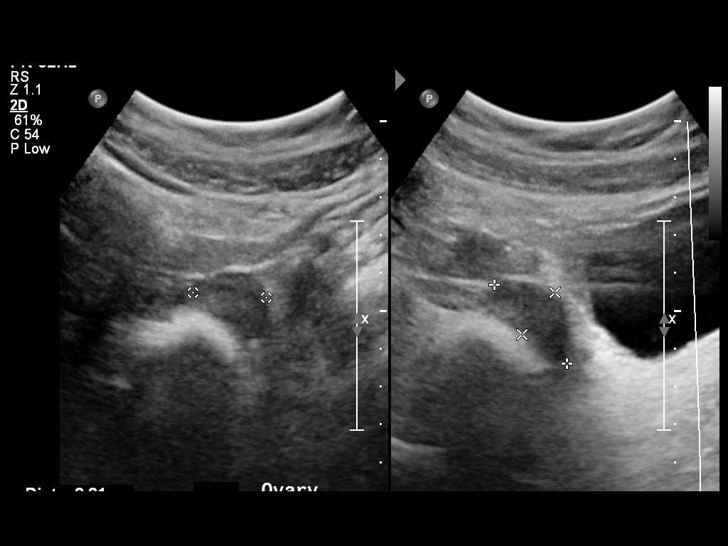
[im 25/60]
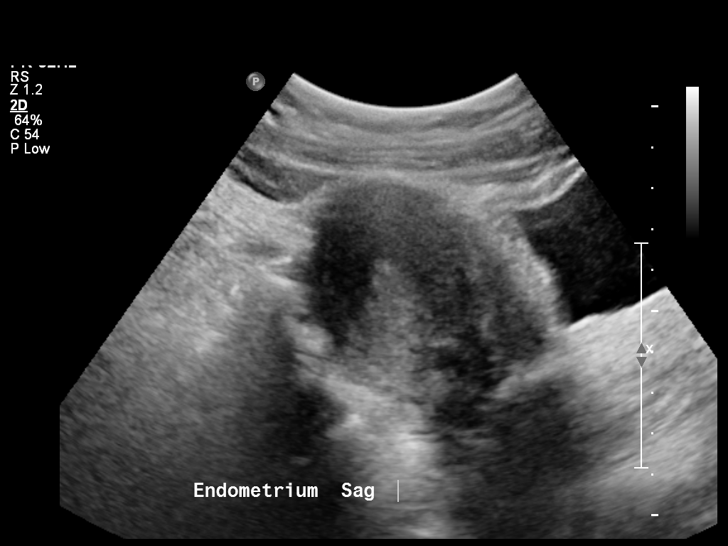
[im 30/60]
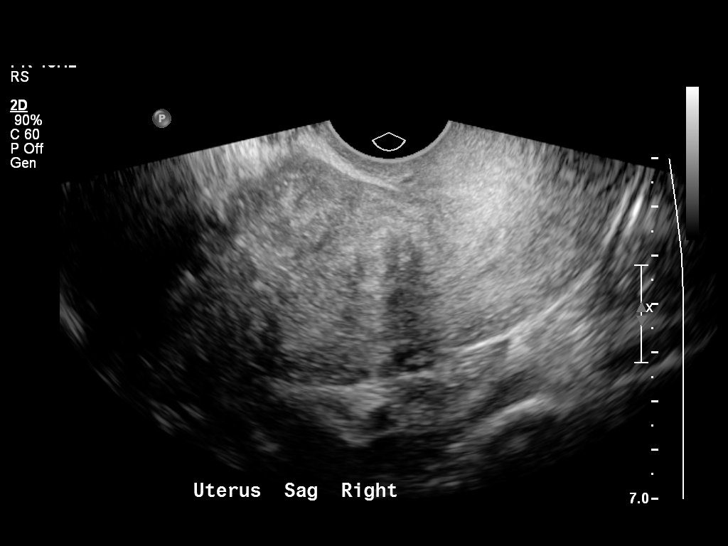
[im 35/60]
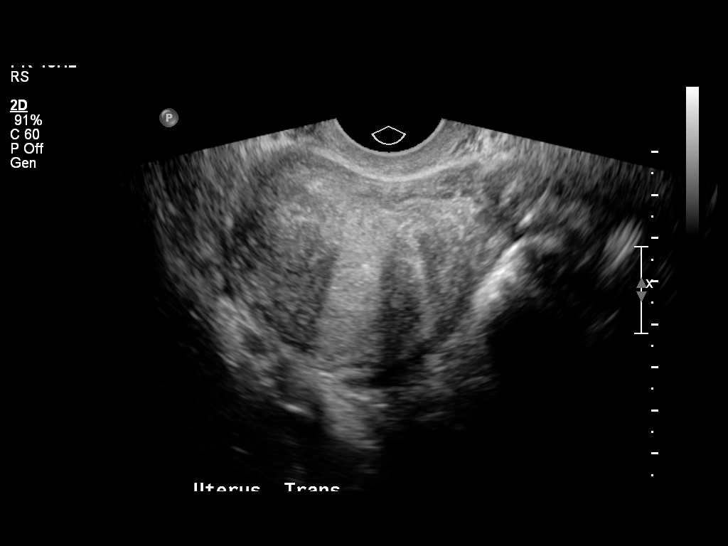
[im 40/60]
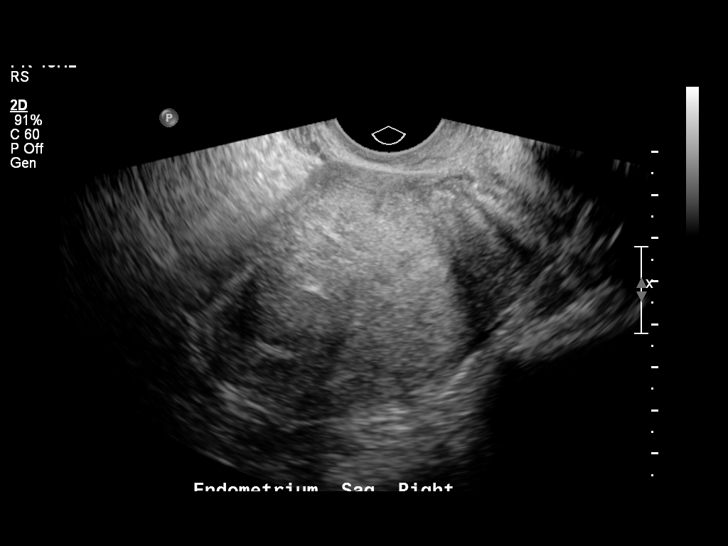
[im 45/60]
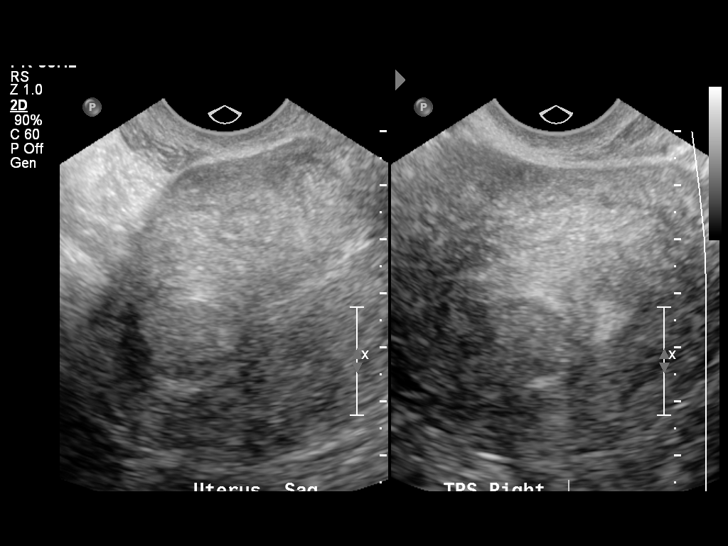
[im 50/60]
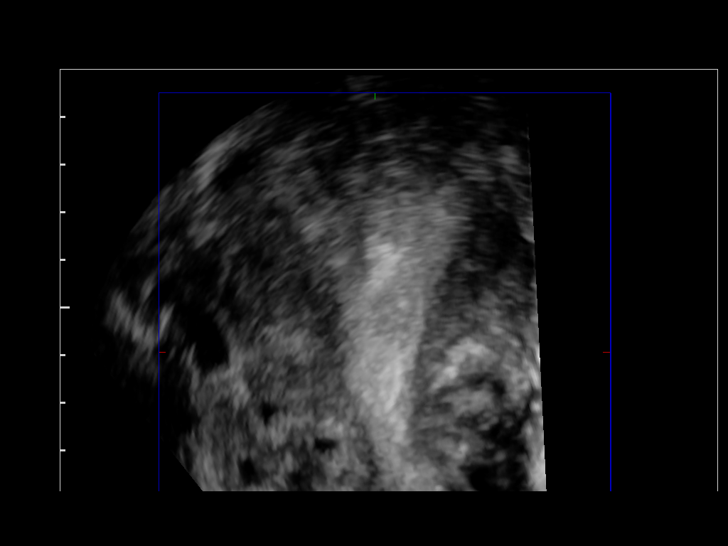
[im 55/60]
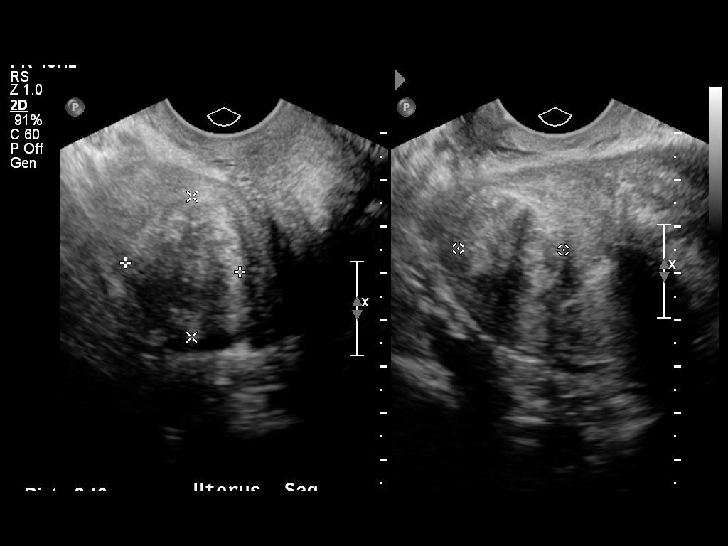
[im 60/60]
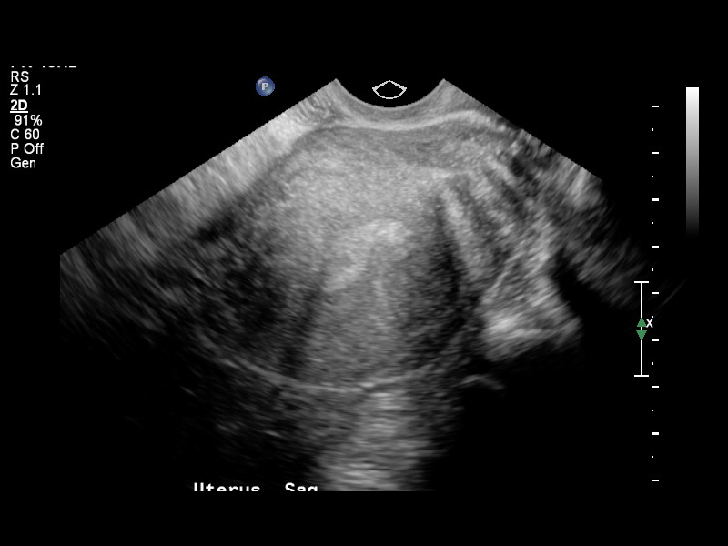

[13 of 25 positions shown; findings below may reference images not displayed]

FINDINGS: Uterus:  11.0 x 5.7 x 6.5 cm.  Several small intramural and
subserosal fibroids are seen involving the uterus.  At least four
separate fibroids are measurable, and range in size from 1.7 cm to
2.6 cm in maximum diameter.  One fibroid in the right fundal region
has a partial submucosal component indenting the endometrium.
Overall, these fibroidsappear mildly increased in size and number
compared to prior exam.

Endometrium: Double layer thickness measures 12 mm transvaginally.
No focal lesion visualized except for submucosal fibroid described
above.

Right ovary: 4.3 x 3.5 x 3.9 cm.  3.6 cm simple cyst or follicle
with benign features.  No other ovarian or adnexal mass identified.

Left ovary: 3.1 x 1.6 x 2.4 cm.  Normal appearance.  No adnexal
mass identified.

Other Findings:  No free fluid
IMPRESSION: 1.  Multiple small uterine fibroids, which appeared mildly
increased since previous study.  One fibroid in the right fundal
region has a submucosal component.
2.  3.6 cm simple cyst or follicle in the right ovary with benign
features.  No further imaging followup required in a a reproductive
age female.  This recommendation follows the consensus statement:
Management of Asymptomatic Ovarian and Other Adnexal Cysts Imaged
at US:  Society of Radiologists in Ultrasound Consensus Conference
3.  Normal appearance of left ovary.

## 2013-11-19 ENCOUNTER — Encounter (HOSPITAL_COMMUNITY): Payer: Self-pay | Admitting: Emergency Medicine

## 2013-11-19 ENCOUNTER — Emergency Department (HOSPITAL_COMMUNITY)
Admission: EM | Admit: 2013-11-19 | Discharge: 2013-11-19 | Disposition: A | Discharge status: Home / Self Care | Payer: Self-pay | Attending: Emergency Medicine | Admitting: Emergency Medicine

## 2013-11-19 DIAGNOSIS — Z8742 Personal history of other diseases of the female genital tract: Secondary | ICD-10-CM | POA: Insufficient documentation

## 2013-11-19 DIAGNOSIS — J329 Chronic sinusitis, unspecified: Secondary | ICD-10-CM | POA: Insufficient documentation

## 2013-11-19 DIAGNOSIS — Z79899 Other long term (current) drug therapy: Secondary | ICD-10-CM | POA: Insufficient documentation

## 2013-11-19 DIAGNOSIS — R5381 Other malaise: Secondary | ICD-10-CM | POA: Insufficient documentation

## 2013-11-19 DIAGNOSIS — G40909 Epilepsy, unspecified, not intractable, without status epilepticus: Secondary | ICD-10-CM | POA: Insufficient documentation

## 2013-11-19 DIAGNOSIS — Z87891 Personal history of nicotine dependence: Secondary | ICD-10-CM | POA: Insufficient documentation

## 2013-11-19 DIAGNOSIS — Z791 Long term (current) use of non-steroidal anti-inflammatories (NSAID): Secondary | ICD-10-CM | POA: Insufficient documentation

## 2013-11-19 DIAGNOSIS — Z8719 Personal history of other diseases of the digestive system: Secondary | ICD-10-CM | POA: Insufficient documentation

## 2013-11-19 DIAGNOSIS — G43909 Migraine, unspecified, not intractable, without status migrainosus: Secondary | ICD-10-CM | POA: Insufficient documentation

## 2013-11-19 DIAGNOSIS — Z880 Allergy status to penicillin: Secondary | ICD-10-CM | POA: Insufficient documentation

## 2013-11-19 DIAGNOSIS — Z862 Personal history of diseases of the blood and blood-forming organs and certain disorders involving the immune mechanism: Secondary | ICD-10-CM | POA: Insufficient documentation

## 2013-11-19 DIAGNOSIS — J45909 Unspecified asthma, uncomplicated: Secondary | ICD-10-CM | POA: Insufficient documentation

## 2013-11-19 DIAGNOSIS — R51 Headache: Secondary | ICD-10-CM | POA: Insufficient documentation

## 2013-11-19 MED ORDER — AZITHROMYCIN 250 MG PO TABS: 250.0000 mg | ORAL_TABLET | Freq: Every day | ORAL | Status: DC

## 2013-11-19 NOTE — ED Notes (Signed)
Pt c/o sneezing with sinus pain and pressure for the past 2 months. Pt states she has taken Mucinex for this. Pt states now she has a headache and her throat has become sore and swollen. Pt with no acute distress. Pt ambulatory to exam room with steady gait.

## 2013-11-19 NOTE — ED Provider Notes (Signed)
CSN: [id number]     Arrival date & time 11/19/13  1607 History  This chart was scribed for non-physician practitioner, Roxy Horseman, PA-C,working with Raeford Razor, MD, by Karle Plumber, ED Scribe.  This patient was seen in room WTR7/WTR7 and the patient's care was started at 4:34 PM.  Chief Complaint  Patient presents with  . Sinus pressure and pain   . Sore Throat   The history is provided by the patient. No language interpreter was used.   HPI Comments:  Nicole [name] is a 37 y.o. female who presents to the Emergency Department complaining of sneezing, rhinorrhea, headaches, sore throat and generalized weakness for approximately two months. She reports associated subjective fevers. She states she has been taking Mucinex with no relief. She states nothing has helped her symptoms. Pt reports h/o seizures. She reports being allergic to PCN and breaks out in hives and Sulfa medications make her swell.   Past Medical History  Diagnosis Date  . Anemia   . Asthma   . Blood transfusion 2011    r/t anemia  . Migraine   . Sickle cell trait   . Seizures     last sz Jan 2012  . Seizures   . Fibroid   . Tooth abscess    Past Surgical History  Procedure Laterality Date  . Tubal ligation Bilateral 2005   Family History  Problem Relation Age of Onset  . Anesthesia problems Neg Hx   . Hypertension Mother   . Diabetes Mother   . Cancer Mother   . Diabetes Father   . Diabetes Maternal Grandmother    History  Substance Use Topics  . Smoking status: Former Games developer  . Smokeless tobacco: Not on file  . Alcohol Use: No   OB History   Grav Para Term Preterm Abortions TAB SAB Ect Mult Living   3 2 1 1 1  0 0 1 0 2     Review of Systems A complete 10 system review of systems was obtained and all systems are negative except as noted in the HPI and PMH.   Allergies  Penicillins and Sulfa antibiotics  Home Medications   Current Outpatient Rx  Name  Route  Sig  Dispense  Refill   . albuterol (PROVENTIL HFA;VENTOLIN HFA) 108 (90 BASE) MCG/ACT inhaler   Inhalation   Inhale 2 puffs into the lungs every 6 (six) hours as needed. Shortness of breath          . carbamazepine (TEGRETOL) 200 MG tablet   Oral   Take 100 mg by mouth 2 (two) times daily.          . diclofenac (VOLTAREN) 75 MG EC tablet   Oral   Take 75 mg by mouth 2 (two) times daily with a meal.         . ibuprofen (ADVIL,MOTRIN) 600 MG tablet   Oral   Take 600 mg by mouth every 6 (six) hours as needed for pain.         . medroxyPROGESTERone (DEPO-PROVERA) 150 MG/ML injection   Intramuscular   Inject 150 mg into the muscle every 3 (three) months.         . phenytoin (DILANTIN) 100 MG ER capsule   Oral   Take 100 mg by mouth 4 (four) times daily.          Marland Kitchen topiramate (TOPAMAX) 50 MG tablet   Oral   Take 50 mg by mouth 2 (two) times daily.           Marland Kitchen  traMADol (ULTRAM) 50 MG tablet   Oral   Take 50 mg by mouth every 6 (six) hours as needed for pain.          Triage Vitals: BP 120/85  Pulse 80  Temp(Src) 99.1 F (37.3 C) (Oral)  Resp 25  SpO2 98% Physical Exam  Nursing note and vitals reviewed. Constitutional: She is oriented to person, place, and time. She appears well-developed and well-nourished.  HENT:  Head: Normocephalic and atraumatic.  Mouth/Throat: Oropharynx is clear and moist. No oropharyngeal exudate.  Congestion behind both tympanic membranes. No sign of tonsillar abscess. Uvula midline. Airway is intact.  Tenderness to palpation to axillary sinuses.  Neck: Neck supple.  Cardiovascular: Normal rate, regular rhythm and normal heart sounds.  Exam reveals no gallop and no friction rub.   No murmur heard. Pulmonary/Chest: Effort normal and breath sounds normal. No respiratory distress. She has no wheezes. She has no rales. She exhibits no tenderness.  Abdominal: Soft. She exhibits no distension.  Neurological: She is alert and oriented to person, place, and  time. No cranial nerve deficit.  Psychiatric: She has a normal mood and affect. Her behavior is normal.    ED Course  Procedures (including critical care time) DIAGNOSTIC STUDIES: Oxygen Saturation is 98% on RA, normal by my interpretation.   COORDINATION OF CARE: 4:39 PM- Will prescribe a Z-Pak. Advised pt that if symptoms worsen or persist for more than 3-5 days, she should follow up with PCP. Advised pt to use whatever OTC medication works best for her to treat symptoms. Pt verbalizes understanding and agrees to plan.  Medications - No data to display  Labs Review Labs Reviewed - No data to display Imaging Review No results found.  EKG Interpretation   None       MDM   1. Sinusitis    Patient complaining of symptoms of sinusitis.    Severe symptoms have been present for greater than 10 days with purulent nasal discharge and maxillary sinus pain. Patient discharged with Azithro 2/2 pen allergy.  Instructions given for warm saline nasal wash and recommendations for follow-up with primary care physician.      I personally performed the services described in this documentation, which was scribed in my presence. The recorded information has been reviewed and is accurate.    Roxy Horseman, PA-C 11/19/13 [phone]

## 2013-11-22 NOTE — ED Provider Notes (Signed)
Medical screening examination/treatment/procedure(s) were performed by non-physician practitioner and as supervising physician I was immediately available for consultation/collaboration.  EKG Interpretation   None        Raeford Razor, MD 11/22/13 [phone]

## 2013-12-12 ENCOUNTER — Encounter (HOSPITAL_COMMUNITY): Payer: Self-pay | Admitting: Emergency Medicine

## 2013-12-12 ENCOUNTER — Emergency Department (HOSPITAL_COMMUNITY)
Admission: EM | Admit: 2013-12-12 | Discharge: 2013-12-13 | Disposition: A | Discharge status: Home / Self Care | Payer: No Typology Code available for payment source | Attending: Emergency Medicine | Admitting: Emergency Medicine

## 2013-12-12 DIAGNOSIS — R7889 Finding of other specified substances, not normally found in blood: Secondary | ICD-10-CM

## 2013-12-12 DIAGNOSIS — Z862 Personal history of diseases of the blood and blood-forming organs and certain disorders involving the immune mechanism: Secondary | ICD-10-CM | POA: Insufficient documentation

## 2013-12-12 DIAGNOSIS — J45909 Unspecified asthma, uncomplicated: Secondary | ICD-10-CM | POA: Insufficient documentation

## 2013-12-12 DIAGNOSIS — G40909 Epilepsy, unspecified, not intractable, without status epilepticus: Secondary | ICD-10-CM | POA: Insufficient documentation

## 2013-12-12 DIAGNOSIS — R748 Abnormal levels of other serum enzymes: Secondary | ICD-10-CM | POA: Insufficient documentation

## 2013-12-12 DIAGNOSIS — Z87891 Personal history of nicotine dependence: Secondary | ICD-10-CM | POA: Insufficient documentation

## 2013-12-12 DIAGNOSIS — Z880 Allergy status to penicillin: Secondary | ICD-10-CM | POA: Insufficient documentation

## 2013-12-12 DIAGNOSIS — Z8742 Personal history of other diseases of the female genital tract: Secondary | ICD-10-CM | POA: Insufficient documentation

## 2013-12-12 DIAGNOSIS — G43909 Migraine, unspecified, not intractable, without status migrainosus: Secondary | ICD-10-CM | POA: Insufficient documentation

## 2013-12-12 DIAGNOSIS — Z79899 Other long term (current) drug therapy: Secondary | ICD-10-CM | POA: Insufficient documentation

## 2013-12-12 DIAGNOSIS — R569 Unspecified convulsions: Secondary | ICD-10-CM

## 2013-12-12 DIAGNOSIS — Z8719 Personal history of other diseases of the digestive system: Secondary | ICD-10-CM | POA: Insufficient documentation

## 2013-12-12 LAB — CBC WITH DIFFERENTIAL/PLATELET
BASOS ABS: 0 10*3/uL (ref 0.0–0.1)
BASOS PCT: 1 % (ref 0–1)
Eosinophils Absolute: 0.5 10*3/uL (ref 0.0–0.7)
Eosinophils Relative: 7 % — ABNORMAL HIGH (ref 0–5)
HCT: 34.1 % — ABNORMAL LOW (ref 36.0–46.0)
Hemoglobin: 11.4 g/dL — ABNORMAL LOW (ref 12.0–15.0)
LYMPHS PCT: 39 % (ref 12–46)
Lymphs Abs: 2.8 10*3/uL (ref 0.7–4.0)
MCH: 29.2 pg (ref 26.0–34.0)
MCHC: 33.4 g/dL (ref 30.0–36.0)
MCV: 87.2 fL (ref 78.0–100.0)
Monocytes Absolute: 0.5 10*3/uL (ref 0.1–1.0)
Monocytes Relative: 7 % (ref 3–12)
NEUTROS ABS: 3.4 10*3/uL (ref 1.7–7.7)
NEUTROS PCT: 47 % (ref 43–77)
PLATELETS: 190 10*3/uL (ref 150–400)
RBC: 3.91 MIL/uL (ref 3.87–5.11)
RDW: 12.8 % (ref 11.5–15.5)
WBC: 7.1 10*3/uL (ref 4.0–10.5)

## 2013-12-12 LAB — RAPID URINE DRUG SCREEN, HOSP PERFORMED
Amphetamines: NOT DETECTED
BARBITURATES: NOT DETECTED
Benzodiazepines: NOT DETECTED
COCAINE: NOT DETECTED
Opiates: NOT DETECTED
TETRAHYDROCANNABINOL: NOT DETECTED

## 2013-12-12 LAB — GLUCOSE, CAPILLARY: Glucose-Capillary: 107 mg/dL — ABNORMAL HIGH (ref 70–99)

## 2013-12-12 MED ORDER — THIAMINE HCL 100 MG/ML IJ SOLN
100.0000 mg | Freq: Every day | INTRAMUSCULAR | Status: DC
  Administered 2013-12-12: 100 mg via INTRAVENOUS
  Filled 2013-12-12: qty 2

## 2013-12-12 MED ORDER — ACETAMINOPHEN 325 MG PO TABS
650.0000 mg | ORAL_TABLET | Freq: Once | ORAL | Status: DC
  Filled 2013-12-12: qty 2

## 2013-12-12 NOTE — ED Provider Notes (Signed)
CSN: [id number]     Arrival date & time 12/12/13  2128 History   First MD Initiated Contact with Patient 12/12/13 2144     Chief Complaint  Patient presents with  . Seizures    Patient is a 38 y.o. female presenting with seizures. The history is provided by the patient.  Seizures Seizure activity on arrival: no   Seizure type:  Grand mal Preceding symptoms: no sensation of an aura present, no dizziness, no euphoria and no hyperventilation   Preceding symptoms comment:  Pt was sleeping.  Her fiance witness 4 seizures back to back this evening.   Episode characteristics: generalized shaking, stiffening and tongue biting   Episode characteristics: no incontinence   Postictal symptoms: confusion and somnolence   Return to baseline: yes   Severity:  Moderate Progression:  Resolved Context: change in medication (pt ran out of her topamax)   Context: not alcohol withdrawal, not drug use and not emotional upset   Recent head injury:  No recent head injuries PTA treatment:  None History of seizures: yes     Past Medical History  Diagnosis Date  . Anemia   . Asthma   . Blood transfusion 2011    r/t anemia  . Migraine   . Sickle cell trait   . Seizures     last sz Jan 2012  . Seizures   . Fibroid   . Tooth abscess    Past Surgical History  Procedure Laterality Date  . Tubal ligation Bilateral 2005   Family History  Problem Relation Age of Onset  . Anesthesia problems Neg Hx   . Hypertension Mother   . Diabetes Mother   . Cancer Mother   . Diabetes Father   . Diabetes Maternal Grandmother    History  Substance Use Topics  . Smoking status: Former Research scientist (life sciences)  . Smokeless tobacco: Not on file  . Alcohol Use: No   OB History   Grav Para Term Preterm Abortions TAB SAB Ect Mult Living   3 2 1 1 1  0 0 1 0 2     Review of Systems  Neurological: Positive for seizures.  All other systems reviewed and are negative.    Allergies  Penicillins and Sulfa antibiotics  Home  Medications   Current Outpatient Rx  Name  Route  Sig  Dispense  Refill  . medroxyPROGESTERone (DEPO-PROVERA) 150 MG/ML injection   Intramuscular   Inject 150 mg into the muscle every 3 (three) months. November 2014         . albuterol (PROVENTIL HFA;VENTOLIN HFA) 108 (90 BASE) MCG/ACT inhaler   Inhalation   Inhale 2 puffs into the lungs every 6 (six) hours as needed for shortness of breath. Shortness of breath         . carbamazepine (TEGRETOL) 200 MG tablet   Oral   Take 100 mg by mouth 2 (two) times daily.          Marland Kitchen ibuprofen (ADVIL,MOTRIN) 600 MG tablet   Oral   Take 600 mg by mouth every 6 (six) hours as needed for moderate pain.          . phenytoin (DILANTIN) 100 MG ER capsule   Oral   Take 100 mg by mouth 4 (four) times daily.          Marland Kitchen topiramate (TOPAMAX) 50 MG tablet   Oral   Take 50 mg by mouth 2 (two) times daily.  BP 100/58  Pulse 91  Temp(Src) 98.7 F (37.1 C) (Oral)  Resp 22  SpO2 96% Physical Exam  Nursing note and vitals reviewed. Constitutional: She appears well-developed and well-nourished. No distress.  HENT:  Head: Normocephalic and atraumatic.  Right Ear: External ear normal.  Left Ear: External ear normal.  Eyes: Conjunctivae are normal. Right eye exhibits no discharge. Left eye exhibits no discharge. No scleral icterus.  Neck: Neck supple. No tracheal deviation present.  Cardiovascular: Normal rate, regular rhythm and intact distal pulses.   Pulmonary/Chest: Effort normal and breath sounds normal. No stridor. No respiratory distress. She has no wheezes. She has no rales.  Abdominal: Soft. Bowel sounds are normal. She exhibits no distension. There is no tenderness. There is no rebound and no guarding.  Musculoskeletal: She exhibits no edema and no tenderness.  Neurological: She is alert. She has normal strength. No sensory deficit. Cranial nerve deficit:  no facial droop, normal eye movements, tongue midline. She exhibits  normal muscle tone. She displays no seizure activity. Coordination normal.  Skin: Skin is warm and dry. No rash noted.  Psychiatric: She has a normal mood and affect.    ED Course  Procedures (including critical care time) Labs Review Labs Reviewed  CBC WITH DIFFERENTIAL - Abnormal; Notable for the following:    Hemoglobin 11.4 (*)    HCT 34.1 (*)    Eosinophils Relative 7 (*)    All other components within normal limits  GLUCOSE, CAPILLARY - Abnormal; Notable for the following:    Glucose-Capillary 107 (*)    All other components within normal limits  URINE RAPID DRUG SCREEN (HOSP PERFORMED)  COMPREHENSIVE METABOLIC PANEL  ETHANOL  PHENYTOIN LEVEL, TOTAL  VALPROIC ACID LEVEL  CARBAMAZEPINE LEVEL, TOTAL  HCG, SERUM, QUALITATIVE   Imaging Review No results found.  EKG Interpretation   None       MDM  Pt with history of seizures.  Pt is at her baseline.  Will check levels and reassess.    Kathalene Frames, MD 12/13/13 [phone]

## 2013-12-12 NOTE — ED Notes (Signed)
Per EMS, Pt fiance said she had 4 seizures since 6pm. Fiance called on the 4th one because she was bting her tongue and lip. Pt. Compliant with medication according to herself. Pt sounds congested. 20 in R hand. Pt had "seizure" during transport, however pt. Was responsive during this time.

## 2013-12-12 NOTE — ED Notes (Signed)
Family requesting labs to be drawn from IV site. RN made aware.

## 2013-12-12 NOTE — ED Notes (Signed)
Bed: WA07 Expected date:  Expected time:  Means of arrival:  Comments: EMS 37yo F seizures

## 2013-12-13 LAB — COMPREHENSIVE METABOLIC PANEL
ALK PHOS: 72 U/L (ref 39–117)
ALT: 25 U/L (ref 0–35)
AST: 21 U/L (ref 0–37)
Albumin: 3.3 g/dL — ABNORMAL LOW (ref 3.5–5.2)
BILIRUBIN TOTAL: 0.2 mg/dL — AB (ref 0.3–1.2)
BUN: 8 mg/dL (ref 6–23)
CHLORIDE: 104 meq/L (ref 96–112)
CO2: 22 meq/L (ref 19–32)
CREATININE: 0.65 mg/dL (ref 0.50–1.10)
Calcium: 8.9 mg/dL (ref 8.4–10.5)
GFR calc Af Amer: >90 mL/min (ref >90)
Glucose, Bld: 111 mg/dL — ABNORMAL HIGH (ref 70–99)
POTASSIUM: 3.6 meq/L — AB (ref 3.7–5.3)
Sodium: 138 mEq/L (ref 137–147)
Total Protein: 6.4 g/dL (ref 6.0–8.3)

## 2013-12-13 LAB — ETHANOL

## 2013-12-13 LAB — VALPROIC ACID LEVEL

## 2013-12-13 LAB — PHENYTOIN LEVEL, TOTAL

## 2013-12-13 LAB — HCG, SERUM, QUALITATIVE: PREG SERUM: NEGATIVE

## 2013-12-13 LAB — CARBAMAZEPINE LEVEL, TOTAL: CARBAMAZEPINE LVL: 4.7 ug/mL (ref 4.0–12.0)

## 2013-12-13 MED ORDER — PHENYTOIN SODIUM EXTENDED 100 MG PO CAPS
400.0000 mg | ORAL_CAPSULE | Freq: Once | ORAL | Status: AC
  Administered 2013-12-13: 400 mg via ORAL
  Filled 2013-12-13: qty 4

## 2013-12-13 MED ORDER — CARBAMAZEPINE 200 MG PO TABS: 100.0000 mg | ORAL_TABLET | Freq: Two times a day (BID) | ORAL | Status: DC

## 2013-12-13 MED ORDER — LORAZEPAM 1 MG PO TABS
1.0000 mg | ORAL_TABLET | Freq: Once | ORAL | Status: AC
  Administered 2013-12-13: 1 mg via ORAL
  Filled 2013-12-13: qty 1

## 2013-12-13 NOTE — ED Notes (Signed)
Pt. Is much more alert. Pt. Now asking questions.

## 2013-12-21 IMAGING — CR DG CHEST 2V
2 series · 2 of 2 positions shown · non-contrast
Comparison: 12/16/2012

CLINICAL DATA: Chest pain.

CHEST - 2 VIEW

[w chest pa]
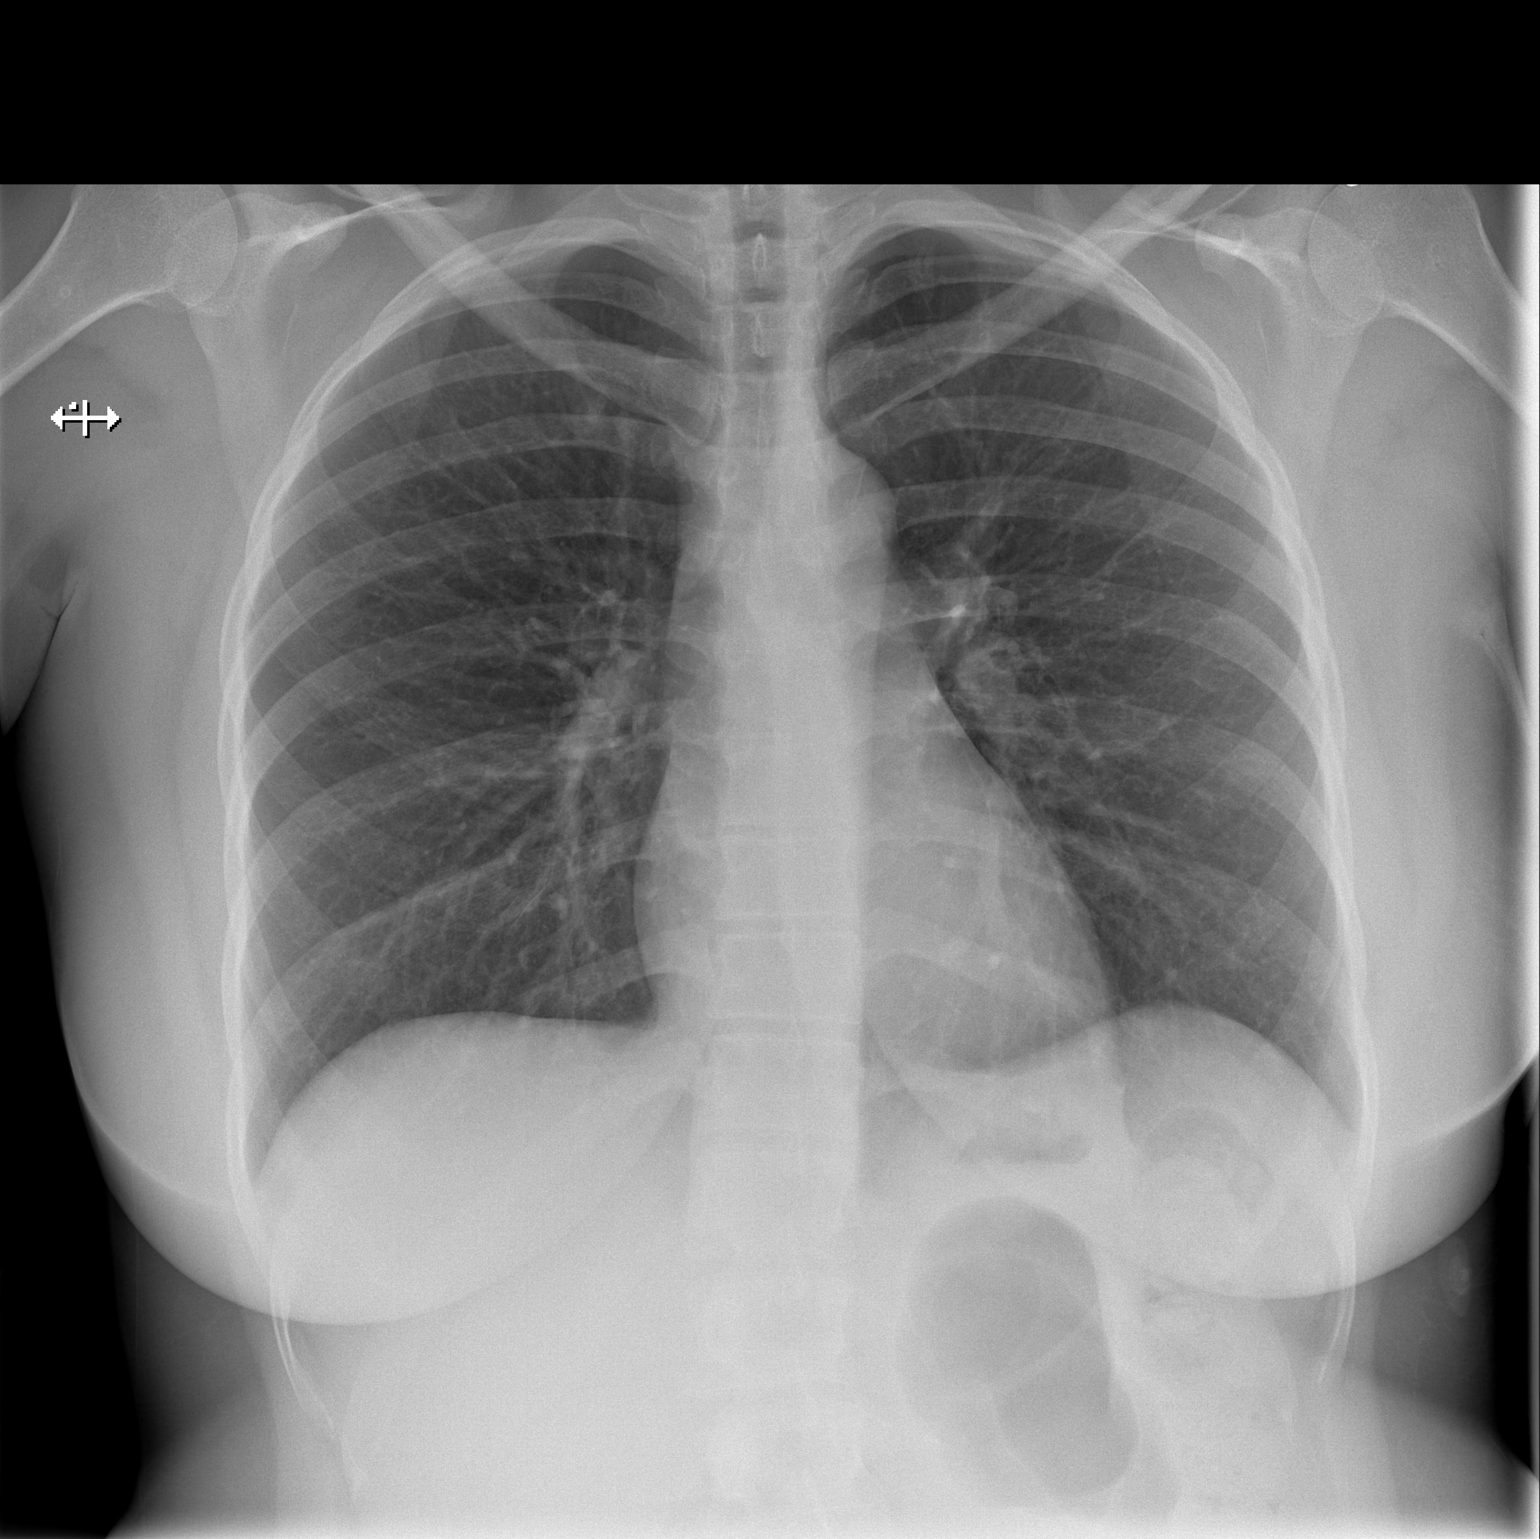

[w chest lat]
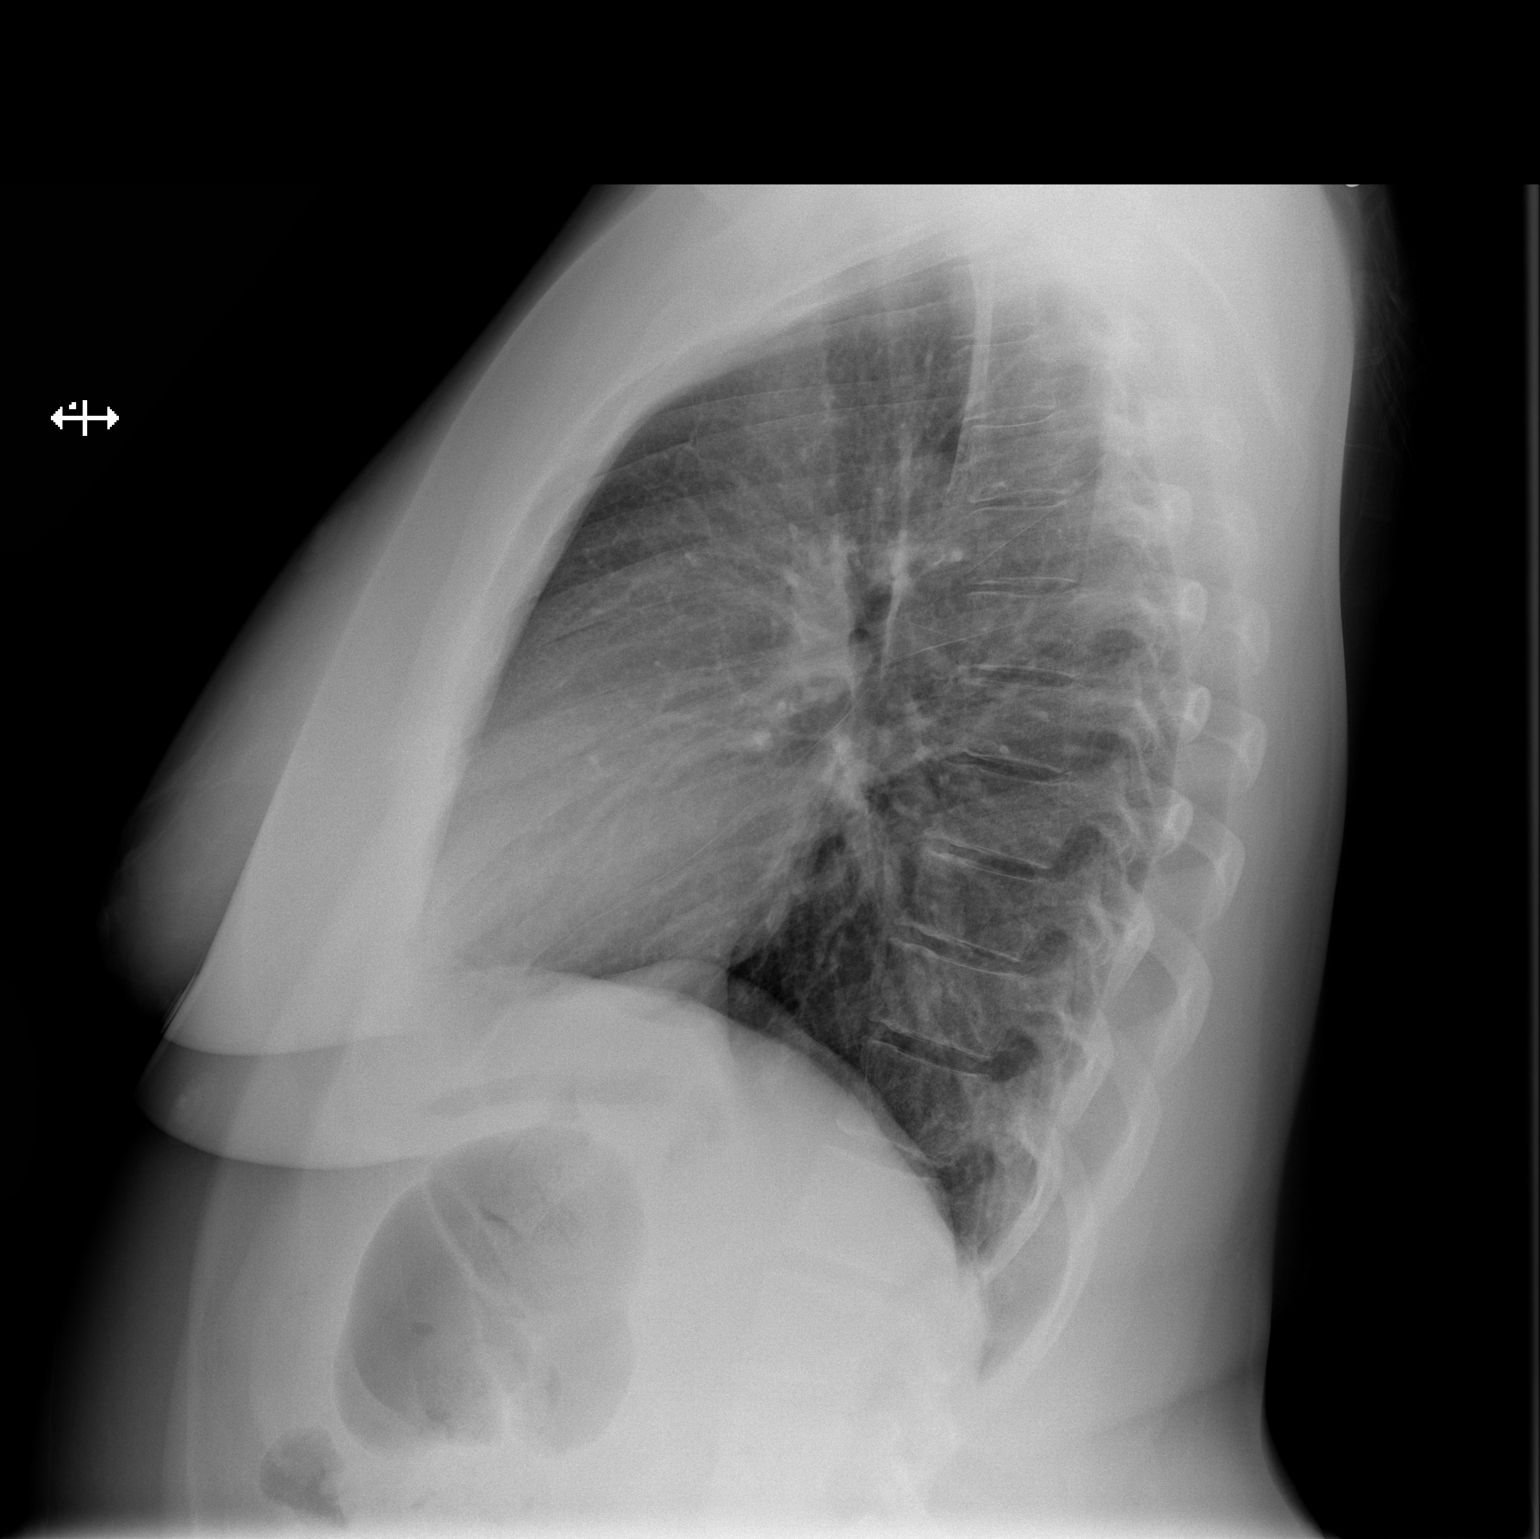

[2 of 2 positions shown; findings below may reference images not displayed]

FINDINGS: Heart and mediastinal contours are within normal limits.
No focal opacities or effusions.  No acute bony abnormality.
IMPRESSION: No active cardiopulmonary disease.

## 2014-01-05 ENCOUNTER — Encounter (HOSPITAL_COMMUNITY): Payer: Self-pay | Admitting: Emergency Medicine

## 2014-01-05 ENCOUNTER — Emergency Department (HOSPITAL_COMMUNITY): Payer: Self-pay

## 2014-01-05 ENCOUNTER — Emergency Department (HOSPITAL_COMMUNITY)
Admission: EM | Admit: 2014-01-05 | Discharge: 2014-01-05 | Disposition: A | Discharge status: Home / Self Care | Payer: Self-pay | Attending: Emergency Medicine | Admitting: Emergency Medicine

## 2014-01-05 DIAGNOSIS — D573 Sickle-cell trait: Secondary | ICD-10-CM | POA: Insufficient documentation

## 2014-01-05 DIAGNOSIS — Z79899 Other long term (current) drug therapy: Secondary | ICD-10-CM | POA: Insufficient documentation

## 2014-01-05 DIAGNOSIS — G43909 Migraine, unspecified, not intractable, without status migrainosus: Secondary | ICD-10-CM | POA: Insufficient documentation

## 2014-01-05 DIAGNOSIS — Z87891 Personal history of nicotine dependence: Secondary | ICD-10-CM | POA: Insufficient documentation

## 2014-01-05 DIAGNOSIS — J45909 Unspecified asthma, uncomplicated: Secondary | ICD-10-CM | POA: Insufficient documentation

## 2014-01-05 DIAGNOSIS — R0789 Other chest pain: Secondary | ICD-10-CM | POA: Insufficient documentation

## 2014-01-05 DIAGNOSIS — Z880 Allergy status to penicillin: Secondary | ICD-10-CM | POA: Insufficient documentation

## 2014-01-05 DIAGNOSIS — G40909 Epilepsy, unspecified, not intractable, without status epilepticus: Secondary | ICD-10-CM | POA: Insufficient documentation

## 2014-01-05 LAB — CBC WITH DIFFERENTIAL/PLATELET
BASOS ABS: 0.1 10*3/uL (ref 0.0–0.1)
Basophils Relative: 1 % (ref 0–1)
EOS ABS: 0.5 10*3/uL (ref 0.0–0.7)
Eosinophils Relative: 8 % — ABNORMAL HIGH (ref 0–5)
HCT: 35 % — ABNORMAL LOW (ref 36.0–46.0)
Hemoglobin: 12.2 g/dL (ref 12.0–15.0)
LYMPHS ABS: 1.7 10*3/uL (ref 0.7–4.0)
Lymphocytes Relative: 31 % (ref 12–46)
MCH: 29.9 pg (ref 26.0–34.0)
MCHC: 34.9 g/dL (ref 30.0–36.0)
MCV: 85.8 fL (ref 78.0–100.0)
Monocytes Absolute: 0.4 10*3/uL (ref 0.1–1.0)
Monocytes Relative: 7 % (ref 3–12)
NEUTROS PCT: 52 % (ref 43–77)
Neutro Abs: 2.9 10*3/uL (ref 1.7–7.7)
PLATELETS: 229 10*3/uL (ref 150–400)
RBC: 4.08 MIL/uL (ref 3.87–5.11)
RDW: 12.7 % (ref 11.5–15.5)
WBC: 5.5 10*3/uL (ref 4.0–10.5)

## 2014-01-05 LAB — POCT I-STAT TROPONIN I
TROPONIN I, POC: 0 ng/mL (ref 0.00–0.08)
Troponin i, poc: 0 ng/mL (ref 0.00–0.08)

## 2014-01-05 LAB — BASIC METABOLIC PANEL
BUN: 7 mg/dL (ref 6–23)
CO2: 22 mEq/L (ref 19–32)
Calcium: 8.8 mg/dL (ref 8.4–10.5)
Chloride: 103 mEq/L (ref 96–112)
Creatinine, Ser: 0.72 mg/dL (ref 0.50–1.10)
Glucose, Bld: 98 mg/dL (ref 70–99)
POTASSIUM: 4.2 meq/L (ref 3.7–5.3)
SODIUM: 139 meq/L (ref 137–147)

## 2014-01-05 LAB — D-DIMER, QUANTITATIVE (NOT AT ARMC)

## 2014-01-05 MED ORDER — METOCLOPRAMIDE HCL 5 MG/ML IJ SOLN
5.0000 mg | Freq: Once | INTRAMUSCULAR | Status: AC
  Administered 2014-01-05: 5 mg via INTRAVENOUS
  Filled 2014-01-05: qty 2

## 2014-01-05 MED ORDER — KETOROLAC TROMETHAMINE 30 MG/ML IJ SOLN
30.0000 mg | Freq: Once | INTRAMUSCULAR | Status: AC
  Administered 2014-01-05: 30 mg via INTRAVENOUS
  Filled 2014-01-05: qty 1

## 2014-01-05 MED ORDER — MORPHINE SULFATE 4 MG/ML IJ SOLN
4.0000 mg | Freq: Once | INTRAMUSCULAR | Status: AC
  Administered 2014-01-05: 4 mg via INTRAVENOUS
  Filled 2014-01-05: qty 1

## 2014-01-05 MED ORDER — IBUPROFEN 600 MG PO TABS: 600.0000 mg | ORAL_TABLET | Freq: Four times a day (QID) | ORAL | Status: DC | PRN

## 2014-01-05 MED ORDER — SODIUM CHLORIDE 0.9 % IV BOLUS (SEPSIS)
1000.0000 mL | Freq: Once | INTRAVENOUS | Status: AC
  Administered 2014-01-05: 1000 mL via INTRAVENOUS

## 2014-01-05 MED ORDER — DIPHENHYDRAMINE HCL 50 MG/ML IJ SOLN
25.0000 mg | Freq: Once | INTRAMUSCULAR | Status: AC
  Administered 2014-01-05: 25 mg via INTRAVENOUS
  Filled 2014-01-05: qty 1

## 2014-01-05 NOTE — ED Provider Notes (Signed)
CSN: [id number]     Arrival date & time 01/05/14  0704 History   First MD Initiated Contact with Patient 01/05/14 [phone]     Chief Complaint  Patient presents with  . Shortness of Breath   (Consider location/radiation/quality/duration/timing/severity/associated sxs/prior Treatment) HPI   This is a 38 year old female with a history of asthma, sickle cell trait who presents with multiple complaints. Patient reports chest pain, shortness breath, and headache. Patient reports 3 days of headache. She has a history of migraines and states that this is similar. Patient also reports onset of chest pain last night. She reports that is throbbing and nonradiating. It is worse with eating and with movement. Currently her pain is 8/10. She's not taking anything for her. Patient also reports shortness of breath started earlier today.  She denies any lower extremity swelling or history of blood clots.  Past Medical History  Diagnosis Date  . Anemia   . Asthma   . Blood transfusion 2011    r/t anemia  . Migraine   . Sickle cell trait   . Seizures     last sz Jan 2012  . Seizures   . Fibroid   . Tooth abscess    Past Surgical History  Procedure Laterality Date  . Tubal ligation Bilateral 2005   Family History  Problem Relation Age of Onset  . Anesthesia problems Neg Hx   . Hypertension Mother   . Diabetes Mother   . Cancer Mother   . Diabetes Father   . Diabetes Maternal Grandmother    History  Substance Use Topics  . Smoking status: Former Research scientist (life sciences)  . Smokeless tobacco: Not on file  . Alcohol Use: No   OB History   Grav Para Term Preterm Abortions TAB SAB Ect Mult Living   3 2 1 1 1  0 0 1 0 2     Review of Systems  Constitutional: Negative for fever.  Respiratory: Positive for chest tightness and shortness of breath. Negative for cough.   Cardiovascular: Positive for chest pain. Negative for leg swelling.  Gastrointestinal: Negative for nausea, vomiting and abdominal pain.   Genitourinary: Negative for dysuria.  Musculoskeletal: Negative for back pain.  Skin: Negative for wound.  Neurological: Positive for headaches. Negative for weakness and numbness.  Psychiatric/Behavioral: Negative for confusion.  All other systems reviewed and are negative.    Allergies  Penicillins and Sulfa antibiotics  Home Medications   Current Outpatient Rx  Name  Route  Sig  Dispense  Refill  . albuterol (PROVENTIL HFA;VENTOLIN HFA) 108 (90 BASE) MCG/ACT inhaler   Inhalation   Inhale 2 puffs into the lungs every 6 (six) hours as needed for shortness of breath. Shortness of breath         . carbamazepine (TEGRETOL) 200 MG tablet   Oral   Take 0.5 tablets (100 mg total) by mouth 2 (two) times daily.   60 tablet   0   . phenytoin (DILANTIN) 100 MG ER capsule   Oral   Take 100 mg by mouth 4 (four) times daily.          Marland Kitchen topiramate (TOPAMAX) 50 MG tablet   Oral   Take 50 mg by mouth 2 (two) times daily.           Marland Kitchen ibuprofen (ADVIL,MOTRIN) 600 MG tablet   Oral   Take 1 tablet (600 mg total) by mouth every 6 (six) hours as needed.   30 tablet   0   .  medroxyPROGESTERone (DEPO-PROVERA) 150 MG/ML injection   Intramuscular   Inject 150 mg into the muscle every 3 (three) months. November 2014          BP 108/64  Pulse 89  Temp(Src) 98.3 F (36.8 C) (Oral)  Resp 16  SpO2 98% Physical Exam  Nursing note and vitals reviewed. Constitutional: She is oriented to person, place, and time. She appears well-developed and well-nourished. No distress.  HENT:  Head: Normocephalic and atraumatic.  Eyes: Pupils are equal, round, and reactive to light.  Neck: Neck supple.  Cardiovascular: Normal rate, regular rhythm and normal heart sounds.   No murmur heard. Pulmonary/Chest: Effort normal and breath sounds normal. No respiratory distress. She has no wheezes. She exhibits tenderness.  Abdominal: Soft. Bowel sounds are normal. There is no tenderness. There is no  rebound.  Musculoskeletal: She exhibits no edema.  Neurological: She is alert and oriented to person, place, and time. No cranial nerve deficit.  5/5 strength in all 4 extremities  Skin: Skin is warm and dry.  Psychiatric: She has a normal mood and affect.    ED Course  Procedures (including critical care time) Labs Review Labs Reviewed  CBC WITH DIFFERENTIAL - Abnormal; Notable for the following:    HCT 35.0 (*)    Eosinophils Relative 8 (*)    All other components within normal limits  BASIC METABOLIC PANEL  D-DIMER, QUANTITATIVE  POCT I-STAT TROPONIN I  POCT I-STAT TROPONIN I   Imaging Review Dg Chest 2 View  01/05/2014   CLINICAL DATA:  SHORTNESS OF BREATH  EXAM: CHEST  2 VIEW  COMPARISON:  DG CHEST 2 VIEW dated 04/24/2013  FINDINGS: Normal mediastinum and cardiac silhouette. Normal pulmonary vasculature. No evidence of effusion, infiltrate, or pneumothorax. No acute bony abnormality.  IMPRESSION: Normal chest radiograph   Electronically Signed   By: Suzy Bouchard M.D.   On: 01/05/2014 08:03    EKG Interpretation    Date/Time:  Monday January 05 2014 07:23:56 EST Ventricular Rate:  88 PR Interval:  150 QRS Duration: 84 QT Interval:  367 QTC Calculation: 444 R Axis:   41 Text Interpretation:  Sinus rhythm No significant change since last tracing Confirmed by Kaydense Rizo  MD, Jazzmen Restivo ([id number]) on 01/05/2014 7:34:00 AM            MDM   1. Atypical chest pain   2. Migraine    Patient presents with multiple complaints including headache, chest pain, and shortness of breath. She is nontoxic-appearing on exam and vital signs are within normal limits. Chest pain is somewhat atypical for ACS and is reproducible on exam. Patient does have an early family history of heart disease. EKG is normal and delta troponin are negative. Chest x-ray shows no evidence of pneumonia or pneumothorax.  Dimer is neg.  Given that chest pain is reproducible on exam, suspect muscular etiology.   Patient given morphine and Toradol. Patient was treated for her headache with improvement as well. She is nonfocal on exam.    On recheck, patient reports improvement of symptoms. We'll discharge him. Given referral to cardiology for evaluation and possible stress testing. Patient is also to followup with primary care physician if symptoms do not resolve.  After history, exam, and medical workup I feel the patient has been appropriately medically screened and is safe for discharge home. Pertinent diagnoses were discussed with the patient. Patient was given return precautions.    Merryl Hacker, MD 01/05/14 [phone]

## 2014-01-05 NOTE — Progress Notes (Signed)
[facility] provided pt with a list of primary care resources. Patient stated that she was pending insurance on 2/1st.

## 2014-01-05 NOTE — Discharge Instructions (Signed)
Chest Pain (Nonspecific) It is often hard to give a specific diagnosis for the cause of chest pain. There is always a chance that your pain could be related to something serious, such as a heart attack or a blood clot in the lungs. You need to follow up with your caregiver for further evaluation.  Your cardiac evaluation is reassuring and given that your chest hurts when palpated, suggests a muscular etiology. He need to followup with cardiology for possible stress testing. CAUSES   Heartburn.  Pneumonia or bronchitis.  Anxiety or stress.  Inflammation around your heart (pericarditis) or lung (pleuritis or pleurisy).  A blood clot in the lung.  A collapsed lung (pneumothorax). It can develop suddenly on its own (spontaneous pneumothorax) or from injury (trauma) to the chest.  Shingles infection (herpes zoster virus). The chest wall is composed of bones, muscles, and cartilage. Any of these can be the source of the pain.  The bones can be bruised by injury.  The muscles or cartilage can be strained by coughing or overwork.  The cartilage can be affected by inflammation and become sore (costochondritis). DIAGNOSIS  Lab tests or other studies, such as X-rays, electrocardiography, stress testing, or cardiac imaging, may be needed to find the cause of your pain.  TREATMENT   Treatment depends on what may be causing your chest pain. Treatment may include:  Acid blockers for heartburn.  Anti-inflammatory medicine.  Pain medicine for inflammatory conditions.  Antibiotics if an infection is present.  You may be advised to change lifestyle habits. This includes stopping smoking and avoiding alcohol, caffeine, and chocolate.  You may be advised to keep your head raised (elevated) when sleeping. This reduces the chance of acid going backward from your stomach into your esophagus.  Most of the time, nonspecific chest pain will improve within 2 to 3 days with rest and mild pain  medicine. HOME CARE INSTRUCTIONS   If antibiotics were prescribed, take your antibiotics as directed. Finish them even if you start to feel better.  For the next few days, avoid physical activities that bring on chest pain. Continue physical activities as directed.  Do not smoke.  Avoid drinking alcohol.  Only take over-the-counter or prescription medicine for pain, discomfort, or fever as directed by your caregiver.  Follow your caregiver's suggestions for further testing if your chest pain does not go away.  Keep any follow-up appointments you made. If you do not go to an appointment, you could develop lasting (chronic) problems with pain. If there is any problem keeping an appointment, you must call to reschedule. SEEK MEDICAL CARE IF:   You think you are having problems from the medicine you are taking. Read your medicine instructions carefully.  Your chest pain does not go away, even after treatment.  You develop a rash with blisters on your chest. SEEK IMMEDIATE MEDICAL CARE IF:   You have increased chest pain or pain that spreads to your arm, neck, jaw, back, or abdomen.  You develop shortness of breath, an increasing cough, or you are coughing up blood.  You have severe back or abdominal pain, feel nauseous, or vomit.  You develop severe weakness, fainting, or chills.  You have a fever. THIS IS AN EMERGENCY. Do not wait to see if the pain will go away. Get medical help at once. Call your local emergency services (911 in [place] Do not drive yourself to the hospital. MAKE SURE YOU:   Understand these instructions.  Will watch your condition.  Will get help right away if you are not doing well or get worse. Document Released: 09/06/2005 Document Revised: 02/19/2012 Document Reviewed: 07/02/2008 [facility] Patient Information 2014 [place].  Migraine Headache A migraine headache is an intense, throbbing pain on one or both sides of your head. A migraine can last  for 30 minutes to several hours. CAUSES  The exact cause of a migraine headache is not always known. However, a migraine may be caused when nerves in the brain become irritated and release chemicals that cause inflammation. This causes pain. Certain things may also trigger migraines, such as:  Alcohol.  Smoking.  Stress.  Menstruation.  Aged cheeses.  Foods or drinks that contain nitrates, glutamate, aspartame, or tyramine.  Lack of sleep.  Chocolate.  Caffeine.  Hunger.  Physical exertion.  Fatigue.  Medicines used to treat chest pain (nitroglycerine), birth control pills, estrogen, and some blood pressure medicines. SIGNS AND SYMPTOMS  Pain on one or both sides of your head.  Pulsating or throbbing pain.  Severe pain that prevents daily activities.  Pain that is aggravated by any physical activity.  Nausea, vomiting, or both.  Dizziness.  Pain with exposure to bright lights, loud noises, or activity.  General sensitivity to bright lights, loud noises, or smells. Before you get a migraine, you may get warning signs that a migraine is coming (aura). An aura may include:  Seeing flashing lights.  Seeing bright spots, halos, or zig-zag lines.  Having tunnel vision or blurred vision.  Having feelings of numbness or tingling.  Having trouble talking.  Having muscle weakness. DIAGNOSIS  A migraine headache is often diagnosed based on:  Symptoms.  Physical exam.  A CT scan or MRI of your head. These imaging tests cannot diagnose migraines, but they can help rule out other causes of headaches. TREATMENT Medicines may be given for pain and nausea. Medicines can also be given to help prevent recurrent migraines.  HOME CARE INSTRUCTIONS  Only take over-the-counter or prescription medicines for pain or discomfort as directed by your health care provider. The use of long-term narcotics is not recommended.  Lie down in a , quiet room when you have a  migraine.  Keep a journal to find out what may trigger your migraine headaches. For example, write down:  What you eat and drink.  How much sleep you get.  Any change to your diet or medicines.  Limit alcohol consumption.  Quit smoking if you smoke.  Get 7 9 hours of sleep, or as recommended by your health care provider.  Limit stress.  Keep lights dim if bright lights bother you and make your migraines worse. SEEK IMMEDIATE MEDICAL CARE IF:   Your migraine becomes severe.  You have a fever.  You have a stiff neck.  You have vision loss.  You have muscular weakness or loss of muscle control.  You start losing your balance or have trouble walking.  You feel faint or pass out.  You have severe symptoms that are different from your first symptoms. MAKE SURE YOU:   Understand these instructions.  Will watch your condition.  Will get help right away if you are not doing well or get worse. Document Released: 11/27/2005 Document Revised: 09/17/2013 Document Reviewed: 08/04/2013 [facility] Patient Information 2014 [place].

## 2014-01-05 NOTE — ED Notes (Signed)
Pt c/o shortness of breath since yesterday; head cold; headache; pt c/o chest pain since last night; states chest pain is throbbing

## 2014-01-05 NOTE — ED Notes (Addendum)
IV attempt x2 unsuccessful. F Hasz verbalizes will attempt IV insertion via [place].

## 2014-01-05 NOTE — ED Notes (Signed)
Per mini lab/ phlebotomy x2 unsuccessful lab draw attempts.

## 2014-01-20 ENCOUNTER — Encounter: Payer: Self-pay | Admitting: Cardiology

## 2014-01-20 ENCOUNTER — Encounter (INDEPENDENT_AMBULATORY_CARE_PROVIDER_SITE_OTHER): Payer: Self-pay

## 2014-01-20 ENCOUNTER — Ambulatory Visit (INDEPENDENT_AMBULATORY_CARE_PROVIDER_SITE_OTHER): Payer: No Typology Code available for payment source | Admitting: Cardiology

## 2014-01-20 VITALS — BP 115/80 | HR 75 | Ht 65.0 in | Wt 241.0 lb

## 2014-01-20 DIAGNOSIS — R079 Chest pain, unspecified: Secondary | ICD-10-CM | POA: Insufficient documentation

## 2014-01-20 DIAGNOSIS — I309 Acute pericarditis, unspecified: Secondary | ICD-10-CM | POA: Insufficient documentation

## 2014-01-20 LAB — CBC WITH DIFFERENTIAL/PLATELET
Basophils Absolute: 0.1 10*3/uL (ref 0.0–0.1)
Basophils Relative: 1.3 % (ref 0.0–3.0)
Eosinophils Absolute: 0.3 10*3/uL (ref 0.0–0.7)
Eosinophils Relative: 5.2 % — ABNORMAL HIGH (ref 0.0–5.0)
HCT: 38.6 % (ref 36.0–46.0)
Hemoglobin: 12.6 g/dL (ref 12.0–15.0)
Lymphocytes Relative: 43.4 % (ref 12.0–46.0)
Lymphs Abs: 2.8 10*3/uL (ref 0.7–4.0)
MCHC: 32.6 g/dL (ref 30.0–36.0)
MCV: 91.3 fl (ref 78.0–100.0)
Monocytes Absolute: 0.4 10*3/uL (ref 0.1–1.0)
Monocytes Relative: 6.8 % (ref 3.0–12.0)
Neutro Abs: 2.8 10*3/uL (ref 1.4–7.7)
Neutrophils Relative %: 43.3 % (ref 43.0–77.0)
Platelets: 282 10*3/uL (ref 150.0–400.0)
RBC: 4.23 Mil/uL (ref 3.87–5.11)
RDW: 13.2 % (ref 11.5–14.6)
WBC: 6.4 10*3/uL (ref 4.5–10.5)

## 2014-01-20 LAB — BASIC METABOLIC PANEL
BUN: 8 mg/dL (ref 6–23)
CO2: 21 mEq/L (ref 19–32)
Calcium: 8.8 mg/dL (ref 8.4–10.5)
Chloride: 111 mEq/L (ref 96–112)
Creatinine, Ser: 0.7 mg/dL (ref 0.4–1.2)
GFR: 125.91 mL/min (ref >60.00)
Glucose, Bld: 81 mg/dL (ref 70–99)
Potassium: 3.9 mEq/L (ref 3.5–5.1)
Sodium: 140 mEq/L (ref 135–145)

## 2014-01-20 LAB — C-REACTIVE PROTEIN: CRP: 0.7 mg/dL (ref 0.5–20.0)

## 2014-01-20 LAB — SEDIMENTATION RATE: Sed Rate: 23 mm/hr — ABNORMAL HIGH (ref 0–22)

## 2014-01-20 MED ORDER — COLCHICINE 0.6 MG PO TABS: 0.6000 mg | ORAL_TABLET | Freq: Two times a day (BID) | ORAL | Status: DC

## 2014-01-20 MED ORDER — INDOMETHACIN 50 MG PO CAPS: 50.0000 mg | ORAL_CAPSULE | Freq: Three times a day (TID) | ORAL | Status: DC

## 2014-01-20 NOTE — Progress Notes (Signed)
Patient ID: Nicole [name], female   DOB: 10/02/1976, 38 y.o.   MRN: [id number]     Patient Name: Nicole [name] Date of Encounter: 01/20/2014  Primary Care Provider:  Patricia Nettle, MD Primary Cardiologist:  Ena Dawley, H  Problem List   Past Medical History  Diagnosis Date  . Anemia   . Asthma   . Blood transfusion 2011    r/t anemia  . Migraine   . Sickle cell trait   . Seizures     last sz Jan 2012  . Seizures   . Fibroid   . Tooth abscess    Past Surgical History  Procedure Laterality Date  . Tubal ligation Bilateral 2005    Allergies  Allergies  Allergen Reactions  . Penicillins Swelling  . Sulfa Antibiotics Rash    HPI  This is a 38 year old female with a history of asthma, sickle cell trait who presents with complaints of chest pain. Chest pain started about a month ago, and it prompted her to visit ED on 01/04/2014 where she was ruled out for ACS and referred to [place]. The patient states that her pain is sharp, retrosternal, worsens with certain positions in bed, but worsens with leaning forward and so does a deep breath. Her pain is not related to exertion. It's on and off but sometimes lasts for hours currently 5/10. Sometimes it radiates to her left arm. It is not associated with shortness of breath dizziness palpitations or syncope. Patient denies any flu-like symptoms prior to starting his chest pain. She has a significant family history of coronary artery disease, her mother had myocardial infarction at age of 54, her grandmother died of massive heart attack at age of 37.  Home Medications  Prior to Admission medications   Medication Sig Start Date End Date Taking? Authorizing Provider  carbamazepine (TEGRETOL) 200 MG tablet Take 0.5 tablets (100 mg total) by mouth 2 (two) times daily. 12/13/13  Yes Hoy Morn, MD  ibuprofen (ADVIL,MOTRIN) 600 MG tablet Take 800 mg by mouth every 6 (six) hours as needed. 01/05/14  Yes Merryl Hacker, MD    medroxyPROGESTERone (DEPO-PROVERA) 150 MG/ML injection Inject 150 mg into the muscle every 3 (three) months. November 2014   Yes Historical Provider, MD  phenytoin (DILANTIN) 100 MG ER capsule Take 100 mg by mouth 4 (four) times daily.    Yes Historical Provider, MD  topiramate (TOPAMAX) 50 MG tablet Take 50 mg by mouth 2 (two) times daily.     Yes Historical Provider, MD  albuterol (PROVENTIL HFA;VENTOLIN HFA) 108 (90 BASE) MCG/ACT inhaler Inhale 2 puffs into the lungs every 6 (six) hours as needed for shortness of breath. Shortness of breath    Historical Provider, MD    Family History  Family History  Problem Relation Age of Onset  . Anesthesia problems Neg Hx   . Hypertension Mother   . Diabetes Mother   . Cancer Mother   . Diabetes Father   . Diabetes Maternal Grandmother     Social History  History   Social History  . Marital Status: Single    Spouse Name: N/A    Number of Children: N/A  . Years of Education: N/A   Occupational History  . Not on file.   Social History Main Topics  . Smoking status: Former Research scientist (life sciences)  . Smokeless tobacco: Not on file  . Alcohol Use: No  . Drug Use: No  . Sexual Activity: Yes    Birth Control/  Protection: Condom, Surgical   Other Topics Concern  . Not on file   Social History Narrative  . No narrative on file     Review of Systems, as per HPI, otherwise negative General:  No chills, fever, night sweats or weight changes.  Cardiovascular:  No chest pain, dyspnea on exertion, edema, orthopnea, palpitations, paroxysmal nocturnal dyspnea. Dermatological: No rash, lesions/masses Respiratory: No cough, dyspnea Urologic: No hematuria, dysuria Abdominal:   No nausea, vomiting, diarrhea, bright red blood per rectum, melena, or hematemesis Neurologic:  No visual changes, wkns, changes in mental status. All other systems reviewed and are otherwise negative except as noted above.  Physical Exam  Blood pressure 115/80, pulse 75, height  5' 5"  (1.651 m), weight 241 lb (109.317 kg).  General: Pleasant, NAD Psych: Normal affect. Neuro: Alert and oriented X 3. Moves all extremities spontaneously. HEENT: Normal  Neck: Supple without bruits or JVD. Lungs:  Resp regular and unlabored, CTA. Heart: RRR no s3, s4, or murmurs. Abdomen: Soft, non-tender, non-distended, BS + x 4.  Extremities: No clubbing, cyanosis or edema. DP/PT/Radials 2+ and equal bilaterally.  Labs:  No results found for this basename: CKTOTAL, CKMB, TROPONINI,  in the last 72 hours Lab Results  Component Value Date   WBC 5.5 01/05/2014   HGB 12.2 01/05/2014   HCT 35.0* 01/05/2014   MCV 85.8 01/05/2014   PLT 229 01/05/2014   No results found for this basename: NA, K, CL, CO2, BUN, CREATININE, CALCIUM, LABALBU, PROT, BILITOT, ALKPHOS, ALT, AST, GLUCOSE,  in the last 168 hours No results found for this basename: CHOL, HDL, LDLCALC, TRIG   Lab Results  Component Value Date   DDIMER <0.27 01/05/2014   Accessory Clinical Findings  Echocardiogram - none  ECG - normal sinus rhythm, 60 beats per minute, normal EKG   Assessment & Plan  38 year old female with chest pain consistent with pericarditis. EKG and physical exam don't suggest a diagnosis but clinical presentation is typical. We'll start patient on indomethacin 50 mg 3 times a day and colchicine 0.6 mg twice a day, we will follow in 3 weeks. We'll check a CBC, CMP, ESR, CRP. We will order an echocardiogram to evaluate for pericardial infusion. If patient has persistent symptoms in 3 weeks, we will consider a cardiac MRI.  Ena Dawley, Lemmie Evens, MD, [facility] 01/20/2014, 3:12 PM

## 2014-01-20 NOTE — Patient Instructions (Addendum)
**Note De-Identified  Obfuscation** Your physician has requested that you have an echocardiogram. Echocardiography is a painless test that uses sound waves to create images of your heart. It provides your doctor with information about the size and shape of your heart and how well your heart's chambers and valves are working. This procedure takes approximately one hour. There are no restrictions for this procedure.  Your physician recommends that you return for lab work in: today  Your physician has recommended you make the following change in your medication: start taking Indomethacin 50 mg three times daily and Colchicine 0.6 mg twice daily.  Your physician recommends that you schedule a follow-up appointment in: 3 weeks

## 2014-01-21 LAB — NMR LIPOPROFILE WITH LIPIDS
Cholesterol, Total: 170 mg/dL (ref <200)
HDL Particle Number: 30.2 umol/L — ABNORMAL LOW (ref >=30.5)
HDL Size: 9 nm — ABNORMAL LOW (ref >=9.2)
HDL-C: 41 mg/dL (ref >=40)
LDL (calc): 111 mg/dL — ABNORMAL HIGH (ref <100)
LDL Particle Number: 1290 nmol/L — ABNORMAL HIGH (ref <1000)
LDL Size: 21.3 nm (ref >20.5)
LP-IR Score: 34 (ref <=45)
Large HDL-P: 5.1 umol/L (ref >=4.8)
Large VLDL-P: 1.6 nmol/L (ref <=2.7)
Small LDL Particle Number: 533 nmol/L — ABNORMAL HIGH (ref <=527)
Triglycerides: 91 mg/dL (ref <150)
VLDL Size: 41.6 nm (ref <=46.6)

## 2014-01-22 ENCOUNTER — Telehealth: Payer: Self-pay | Admitting: Cardiology

## 2014-01-22 NOTE — Telephone Encounter (Signed)
Pt called for continued chest pain, having to sit up, lying back increases pain.  "A bad ache".  She is on indocin and colchicine for pericarditis. I did discuss with Dr. Meda Coffee -asked pt to take 600 mg ibuprofen, if still severe pain then go to ER.  If better overnight but worse tomorrow call the office.  Pt agreeable.

## 2014-01-28 ENCOUNTER — Ambulatory Visit (INDEPENDENT_AMBULATORY_CARE_PROVIDER_SITE_OTHER): Payer: No Typology Code available for payment source | Admitting: Obstetrics

## 2014-01-28 ENCOUNTER — Encounter: Payer: Self-pay | Admitting: Obstetrics

## 2014-01-28 VITALS — BP 107/78 | HR 97 | Temp 97.8°F | Ht 65.0 in | Wt 247.0 lb

## 2014-01-28 DIAGNOSIS — B3731 Acute candidiasis of vulva and vagina: Secondary | ICD-10-CM | POA: Insufficient documentation

## 2014-01-28 DIAGNOSIS — N949 Unspecified condition associated with female genital organs and menstrual cycle: Secondary | ICD-10-CM | POA: Insufficient documentation

## 2014-01-28 DIAGNOSIS — B373 Candidiasis of vulva and vagina: Secondary | ICD-10-CM

## 2014-01-28 DIAGNOSIS — K219 Gastro-esophageal reflux disease without esophagitis: Secondary | ICD-10-CM | POA: Insufficient documentation

## 2014-01-28 MED ORDER — OMEPRAZOLE 20 MG PO CPDR: 20.0000 mg | DELAYED_RELEASE_CAPSULE | Freq: Two times a day (BID) | ORAL | Status: DC

## 2014-01-28 MED ORDER — FLUCONAZOLE 150 MG PO TABS
150.0000 mg | ORAL_TABLET | Freq: Once | ORAL | Status: DC
Start: 2014-01-28 — End: 2014-01-28

## 2014-01-28 MED ORDER — SUCRALFATE 1 GM/10ML PO SUSP: 1.0000 g | Freq: Three times a day (TID) | ORAL | Status: DC

## 2014-01-28 MED ORDER — OXYCODONE HCL 10 MG PO TABS: 10.0000 mg | ORAL_TABLET | Freq: Four times a day (QID) | ORAL | Status: DC | PRN

## 2014-01-28 NOTE — Progress Notes (Signed)
Subjective:     Nicole [name] is a 38 y.o. female here for a routine exam.  Current complaints: Patient in office today for a problem visit. Patient states she is having cramping pain on and off. Patient states she did have a yeast infection, and took the 7 day antibiotic for it but during the 7 days she had thick yellow discharge and itching. Patient denies any discharge or itching now. Patient states anything that she eats makes her stomach hurt. Patient denies any headaches, or nausea.   Personal health questionnaire reviewed: yes.   Gynecologic History No LMP recorded. Patient has had an injection. Contraception: condoms, Depo-Provera injections and tubal ligation  Obstetric History OB History  Gravida Para Term Preterm AB SAB TAB Ectopic Multiple Living  3 2 1 1 1  0 0 1 0 2    # Outcome Date GA Lbr Len/2nd Weight Sex Delivery Anes PTL Lv  3 PRE 12/26/03    M SVD   Y     Comments: 32 wks, IOL for fetal distress  2 TRM 05/17/95    F SVD  Y Y  1 ECT                The following portions of the patient's history were reviewed and updated as appropriate: allergies, current medications, past family history, past medical history, past social history, past surgical history and problem list.  Review of Systems Pertinent items are noted in HPI.    Objective:    General appearance: alert and no distress Abdomen: normal findings: soft, non-tender Pelvic: cervix normal in appearance, external genitalia normal, no adnexal masses or tenderness, no cervical motion tenderness, rectovaginal septum normal, uterus normal size, shape, and consistency and vagina normal without discharge    Assessment:    Abdominal-Pelvic pain.  GERD / Gastritis   Plan:    Education reviewed: Management of abdominal-pelvic pain.  GERD / Gastritis. Contraception: tubal ligation. Follow up in: 2 weeks. Ultrasound ordered. Referred to GI

## 2014-01-29 LAB — WET PREP BY MOLECULAR PROBE
Candida species: POSITIVE — AB
GARDNERELLA VAGINALIS: NEGATIVE
Trichomonas vaginosis: NEGATIVE

## 2014-01-29 LAB — GC/CHLAMYDIA PROBE AMP
CT Probe RNA: NEGATIVE
GC Probe RNA: NEGATIVE

## 2014-02-05 ENCOUNTER — Other Ambulatory Visit: Payer: Self-pay | Admitting: Obstetrics

## 2014-02-05 DIAGNOSIS — N949 Unspecified condition associated with female genital organs and menstrual cycle: Secondary | ICD-10-CM

## 2014-02-06 ENCOUNTER — Ambulatory Visit (HOSPITAL_COMMUNITY)
Admission: RE | Admit: 2014-02-06 | Discharge: 2014-02-06 | Disposition: A | Discharge status: Home / Self Care | Payer: No Typology Code available for payment source | Source: Ambulatory Visit | Attending: Obstetrics | Admitting: Obstetrics

## 2014-02-06 DIAGNOSIS — D259 Leiomyoma of uterus, unspecified: Secondary | ICD-10-CM | POA: Insufficient documentation

## 2014-02-06 DIAGNOSIS — N949 Unspecified condition associated with female genital organs and menstrual cycle: Secondary | ICD-10-CM | POA: Insufficient documentation

## 2014-02-06 DIAGNOSIS — D252 Subserosal leiomyoma of uterus: Secondary | ICD-10-CM | POA: Insufficient documentation

## 2014-02-08 ENCOUNTER — Emergency Department (HOSPITAL_COMMUNITY)
Admission: EM | Admit: 2014-02-08 | Discharge: 2014-02-08 | Disposition: A | Discharge status: Home / Self Care | Payer: No Typology Code available for payment source | Attending: Emergency Medicine | Admitting: Emergency Medicine

## 2014-02-08 ENCOUNTER — Encounter (HOSPITAL_COMMUNITY): Payer: Self-pay | Admitting: Emergency Medicine

## 2014-02-08 ENCOUNTER — Emergency Department (HOSPITAL_COMMUNITY): Payer: No Typology Code available for payment source

## 2014-02-08 DIAGNOSIS — Z8742 Personal history of other diseases of the female genital tract: Secondary | ICD-10-CM | POA: Insufficient documentation

## 2014-02-08 DIAGNOSIS — G43909 Migraine, unspecified, not intractable, without status migrainosus: Secondary | ICD-10-CM | POA: Insufficient documentation

## 2014-02-08 DIAGNOSIS — M25511 Pain in right shoulder: Secondary | ICD-10-CM

## 2014-02-08 DIAGNOSIS — M25519 Pain in unspecified shoulder: Secondary | ICD-10-CM | POA: Insufficient documentation

## 2014-02-08 DIAGNOSIS — G40909 Epilepsy, unspecified, not intractable, without status epilepticus: Secondary | ICD-10-CM | POA: Insufficient documentation

## 2014-02-08 DIAGNOSIS — Z880 Allergy status to penicillin: Secondary | ICD-10-CM | POA: Insufficient documentation

## 2014-02-08 DIAGNOSIS — J45909 Unspecified asthma, uncomplicated: Secondary | ICD-10-CM | POA: Insufficient documentation

## 2014-02-08 DIAGNOSIS — Z8719 Personal history of other diseases of the digestive system: Secondary | ICD-10-CM | POA: Insufficient documentation

## 2014-02-08 DIAGNOSIS — Z862 Personal history of diseases of the blood and blood-forming organs and certain disorders involving the immune mechanism: Secondary | ICD-10-CM | POA: Insufficient documentation

## 2014-02-08 DIAGNOSIS — Z87891 Personal history of nicotine dependence: Secondary | ICD-10-CM | POA: Insufficient documentation

## 2014-02-08 DIAGNOSIS — Z79899 Other long term (current) drug therapy: Secondary | ICD-10-CM | POA: Insufficient documentation

## 2014-02-08 MED ORDER — CYCLOBENZAPRINE HCL 10 MG PO TABS
5.0000 mg | ORAL_TABLET | Freq: Once | ORAL | Status: AC
  Administered 2014-02-08: 5 mg via ORAL
  Filled 2014-02-08: qty 1

## 2014-02-08 MED ORDER — DEXAMETHASONE SODIUM PHOSPHATE 10 MG/ML IJ SOLN
10.0000 mg | Freq: Once | INTRAMUSCULAR | Status: AC
  Administered 2014-02-08: 10 mg via INTRAMUSCULAR
  Filled 2014-02-08: qty 1

## 2014-02-08 MED ORDER — MELOXICAM 15 MG PO TABS: 15.0000 mg | ORAL_TABLET | Freq: Every day | ORAL | Status: DC

## 2014-02-08 NOTE — Discharge Instructions (Signed)
Your x-rays were normal today.  Please follow up with an orthopedic specialist for continued evaluation and treatment.    Shoulder Pain The shoulder is the joint that connects your arms to your body. The bones that form the shoulder joint include the upper arm bone (humerus), the shoulder blade (scapula), and the collarbone (clavicle). The top of the humerus is shaped like a ball and fits into a rather flat socket on the scapula (glenoid cavity). A combination of muscles and strong, fibrous tissues that connect muscles to bones (tendons) support your shoulder joint and hold the ball in the socket. Small, fluid-filled sacs (bursae) are located in different areas of the joint. They act as cushions between the bones and the overlying soft tissues and help reduce friction between the gliding tendons and the bone as you move your arm. Your shoulder joint allows a wide range of motion in your arm. This range of motion allows you to do things like scratch your back or throw a ball. However, this range of motion also makes your shoulder more prone to pain from overuse and injury. Causes of shoulder pain can originate from both injury and overuse and usually can be grouped in the following four categories:  Redness, swelling, and pain (inflammation) of the tendon (tendinitis) or the bursae (bursitis).  Instability, such as a dislocation of the joint.  Inflammation of the joint (arthritis).  Broken bone (fracture). HOME CARE INSTRUCTIONS   Apply ice to the sore area.  Put ice in a plastic bag.  Place a towel between your skin and the bag.  Leave the ice on for 15-20 minutes, 03-04 times per day for the first 2 days.  Stop using cold packs if they do not help with the pain.  If you have a shoulder sling or immobilizer, wear it as long as your caregiver instructs. Only remove it to shower or bathe. Move your arm as little as possible, but keep your hand moving to prevent swelling.  Squeeze a soft  ball or foam pad as much as possible to help prevent swelling.  Only take over-the-counter or prescription medicines for pain, discomfort, or fever as directed by your caregiver. SEEK MEDICAL CARE IF:   Your shoulder pain increases, or new pain develops in your arm, hand, or fingers.  Your hand or fingers become cold and numb.  Your pain is not relieved with medicines. SEEK IMMEDIATE MEDICAL CARE IF:   Your arm, hand, or fingers are numb or tingling.  Your arm, hand, or fingers are significantly swollen or turn white or blue. MAKE SURE YOU:   Understand these instructions.  Will watch your condition.  Will get help right away if you are not doing well or get worse. Document Released: 09/06/2005 Document Revised: 08/21/2012 Document Reviewed: 11/11/2011 [facility] Patient Information 2014 [place], [place].    Shoulder Range of Motion Exercises The shoulder is the most flexible joint in the human body. Because of this it is also the most unstable joint in the body. All ages can develop shoulder problems. Early treatment of problems is necessary for a good outcome. People react to shoulder pain by decreasing the movement of the joint. After a brief period of time, the shoulder can become "frozen". This is an almost complete loss of the ability to move the damaged shoulder. Following injuries your caregivers can give you instructions on exercises to keep your range of motion (ability to move your shoulder freely), or regain it if it has been lost.  EXERCISES EXERCISES TO MAINTAIN THE MOBILITY OF YOUR SHOULDER: Codman's Exercise or Pendulum Exercise  This exercise may be performed in a prone (face-down) lying position or standing while leaning on a chair with the opposite arm. Its purpose is to relax the muscles in your shoulder and slowly but surely increase the range of motion and to relieve pain.  Lie on your stomach close to the side edge of the bed. Let your weak arm hang over the  edge of the bed. Relax your shoulder, arm and hand. Let your shoulder blade relax and drop down.  Slowly and gently swing your arm forward and back. Do not use your neck muscles; relax them. It might be easier to have someone else gently start swinging your arm.  As pain decreases, increase your swing. To start, arm swing should begin at 15 degree angles. In time and as pain lessens, move to 30-45 degree angles. Start with swinging for about 15 seconds, and work towards swinging for 3 to 5 minutes.  This exercise may also be performed in a standing/bent over position.  Stand and hold onto a sturdy chair with your good arm. Bend forward at the waist and bend your knees slightly to help protect your back. Relax your weak arm, let it hang limp. Relax your shoulder blade and let it drop.  Keep your shoulder relaxed and use body motion to swing your arm in small circles.  Stand up tall and relax.  Repeat motion and change direction of circles.  Start with swinging for about 30 seconds, and work towards swinging for 3 to 5 minutes. STRETCHING EXERCISES:  Lift your arm out in front of you with the elbow bent at 90 degrees. Using your other arm gently pull the elbow forward and across your body.  Bend one arm behind you with the palm facing outward. Using the other arm, hold a towel or rope and reach this arm up above your head, then bend it at the elbow to move your wrist to behind your neck. Grab the free end of the towel with the hand behind your back. Gently pull the towel up with the hand behind your neck, gradually increasing the pull on the hand behind the small of your back. Then, gradually pull down with the hand behind the small of your back. This will pull the hand and arm behind your neck further. Both shoulders will have an increased range of motion with repetition of this exercise. STRENGTHENING EXERCISES:  Standing with your arm at your side and straight out from your shoulder with the  elbow bent at 90 degrees, hold onto a small weight and slowly raise your hand so it points straight up in the air. Repeat this five times to begin with, and gradually increase to ten times. Do this four times per day. As you grow stronger you can gradually increase the weight.  Repeat the above exercise, only this time using an elastic band. Start with your hand up in the air and pull down until your hand is by your side. As you grow stronger, gradually increase the amount you pull by increasing the number or size of the elastic bands. Use the same amount of repetitions.  Standing with your hand at your side and holding onto a weight, gradually lift the hand in front of you until it is over your head. Do the same also with the hand remaining at your side and lift the hand away from your body until it is  again over your head. Repeat this five times to begin with, and gradually increase to ten times. Do this four times per day. As you grow stronger you can gradually increase the weight. Document Released: 08/26/2003 Document Revised: 02/19/2012 Document Reviewed: 11/27/2005 [facility] Patient Information 2014 [place].

## 2014-02-08 NOTE — ED Provider Notes (Signed)
Medical screening examination/treatment/procedure(s) were performed by non-physician practitioner and as supervising physician I was immediately available for consultation/collaboration.   EKG Interpretation None        Julianne Rice, MD 02/08/14 [phone]

## 2014-02-08 NOTE — ED Notes (Signed)
Pt reports R shoulder pain that has increased over the past day, denies any known injuries, states that she [place] it in [place] 2003, and lifts pt's at work, but does not remember anything that hurt her shoulder in particular. Pt took Motrin and Percocet at 2100 for her pain, that did not relieve her pain. Pt noted to be lethargic, states she was unable to sleep d/t the pain. Pt a&o x4, NAD noted at this time.

## 2014-02-08 NOTE — ED Provider Notes (Signed)
CSN: [id number]     Arrival date & time 02/08/14  0040 History   First MD Initiated Contact with Patient 02/08/14 0046     Chief Complaint  Patient presents with  . Shoulder Pain   HPI  History provided by the patient. Patient is a 38 year old female with history of seizure disorder and asthma who presents with complaints of right shoulder pain. Patient states she has past history of shoulder injury it occasionally has joint pains. History around 9 PM she suddenly had worsening pain and stiffness to the shoulder. She states it hurts to move even small amounts in the shoulder. She did take ibuprofen and 10 mg of Percocet without any improvement of her pain. She did not have any injury or trauma. Denies any strenuous activity or heavy lifting. There is no radiation of the pain. No weakness or numbness in the hand. No neck pain or stiffness. No other aggravating or alleviating factors. No other associated symptoms.   Past Medical History  Diagnosis Date  . Anemia   . Asthma   . Blood transfusion 2011    r/t anemia  . Migraine   . Sickle cell trait   . Seizures     last sz Jan 2012  . Seizures   . Fibroid   . Tooth abscess    Past Surgical History  Procedure Laterality Date  . Tubal ligation Bilateral 2005   Family History  Problem Relation Age of Onset  . Anesthesia problems Neg Hx   . Hypertension Mother   . Diabetes Mother   . Cancer Mother   . Diabetes Father   . Diabetes Maternal Grandmother    History  Substance Use Topics  . Smoking status: Former Research scientist (life sciences)  . Smokeless tobacco: Never Used  . Alcohol Use: No   OB History   Grav Para Term Preterm Abortions TAB SAB Ect Mult Living   3 2 1 1 1  0 0 1 0 2     Review of Systems  Musculoskeletal: Negative for neck pain.  Neurological: Negative for weakness and numbness.  All other systems reviewed and are negative.      Allergies  Penicillins and Sulfa antibiotics  Home Medications   Current Outpatient Rx  Name   Route  Sig  Dispense  Refill  . albuterol (PROVENTIL HFA;VENTOLIN HFA) 108 (90 BASE) MCG/ACT inhaler   Inhalation   Inhale 2 puffs into the lungs every 6 (six) hours as needed for shortness of breath. Shortness of breath         . carbamazepine (TEGRETOL) 200 MG tablet   Oral   Take 0.5 tablets (100 mg total) by mouth 2 (two) times daily.   60 tablet   0   . colchicine 0.6 MG tablet   Oral   Take 1 tablet (0.6 mg total) by mouth 2 (two) times daily.   60 tablet   3   . ibuprofen (ADVIL,MOTRIN) 600 MG tablet   Oral   Take 800 mg by mouth every 6 (six) hours as needed.         . indomethacin (INDOCIN) 50 MG capsule   Oral   Take 1 capsule (50 mg total) by mouth 3 (three) times daily with meals.   90 capsule   3   . medroxyPROGESTERone (DEPO-PROVERA) 150 MG/ML injection   Intramuscular   Inject 150 mg into the muscle every 3 (three) months.          Marland Kitchen omeprazole (PRILOSEC) 20 MG capsule  Oral   Take 1 capsule (20 mg total) by mouth 2 (two) times daily before a meal.   60 capsule   5   . oxyCODONE (OXY IR/ROXICODONE) 5 MG immediate release tablet   Oral   Take 10 mg by mouth every 6 (six) hours as needed for moderate pain or severe pain.         . phenytoin (DILANTIN) 100 MG ER capsule   Oral   Take 100 mg by mouth 4 (four) times daily.          . sucralfate (CARAFATE) 1 GM/10ML suspension   Oral   Take 10 mLs (1 g total) by mouth 4 (four) times daily -  with meals and at bedtime.   420 mL   5   . topiramate (TOPAMAX) 50 MG tablet   Oral   Take 50 mg by mouth 2 (two) times daily.            BP 98/86  Pulse 103  Temp(Src) 97.9 F (36.6 C) (Oral)  SpO2 99% Physical Exam  Nursing note and vitals reviewed. Constitutional: She is oriented to person, place, and time. She appears well-developed and well-nourished. No distress.  HENT:  Head: Normocephalic.  Neck: Normal range of motion. Neck supple.  No cervical midline tenderness  Cardiovascular:  Normal rate and regular rhythm.   Pulmonary/Chest: Effort normal and breath sounds normal. No respiratory distress. She has no wheezes. She has no rales.  Abdominal: Soft.  Musculoskeletal:  Reduced range of motion of right shoulder secondary to pain. There is pain during passive range of motion but is complete. No swelling or deformities to the shoulder. No popping or clicking. Patient's moves away to any light touch around the anterior posterior shoulder area. Skin is normal without erythema or rash. Normal distal sensations, good strength in pulse.  Neurological: She is alert and oriented to person, place, and time.  Skin: Skin is warm and dry. No rash noted.  Psychiatric: She has a normal mood and affect. Her behavior is normal.    ED Course  Procedures   DIAGNOSTIC STUDIES: Oxygen Saturation is 99% on room air    COORDINATION OF CARE:  Nursing notes reviewed. Vital signs reviewed. Initial pt interview and examination performed.   1:33 AM-patient seen and evaluated. His not appear in acute distress. No history of injury or trauma. Pain appears muscular in nature. Does report past history of right shoulder injury with questionable fracture.   X-rays reviewed. No signs of fractures or dislocations. Unremarkable exam aside from her pain and tenderness. We'll recommend symptomatic treatment and followup with her primary care provider or orthopedics specialist for continued evaluation.   Dg Shoulder Right  02/08/2014   CLINICAL DATA:  Right superior shoulder pain  EXAM: RIGHT SHOULDER - 2+ VIEW  COMPARISON:  07/02/2007  FINDINGS: No fracture or dislocation is seen.  The joint spaces are preserved.  The visualized soft tissues are unremarkable.  Visualized right lung is clear.  IMPRESSION: No fracture or dislocation is seen.   Electronically Signed   By: Julian Hy M.D.   On: 02/08/2014 01:20        MDM   Final diagnoses:  Shoulder pain, right        Martie Lee,  PA-C 02/08/14 [phone]

## 2014-02-11 ENCOUNTER — Encounter: Payer: Self-pay | Admitting: Obstetrics

## 2014-02-11 ENCOUNTER — Ambulatory Visit (INDEPENDENT_AMBULATORY_CARE_PROVIDER_SITE_OTHER): Payer: No Typology Code available for payment source | Admitting: Obstetrics

## 2014-02-11 ENCOUNTER — Ambulatory Visit (HOSPITAL_COMMUNITY): Payer: No Typology Code available for payment source | Attending: Cardiology | Admitting: Cardiology

## 2014-02-11 VITALS — BP 111/78 | HR 83 | Temp 97.0°F | Wt 240.0 lb

## 2014-02-11 DIAGNOSIS — R079 Chest pain, unspecified: Secondary | ICD-10-CM

## 2014-02-11 DIAGNOSIS — Z0000 Encounter for general adult medical examination without abnormal findings: Secondary | ICD-10-CM

## 2014-02-11 DIAGNOSIS — I309 Acute pericarditis, unspecified: Secondary | ICD-10-CM

## 2014-02-11 DIAGNOSIS — D259 Leiomyoma of uterus, unspecified: Secondary | ICD-10-CM | POA: Insufficient documentation

## 2014-02-11 MED ORDER — PNV PRENATAL PLUS MULTIVITAMIN 27-1 MG PO TABS: 1.0000 | ORAL_TABLET | Freq: Every day | ORAL | Status: DC

## 2014-02-11 MED ORDER — MEDROXYPROGESTERONE ACETATE 150 MG/ML IM SUSP: 150.0000 mg | INTRAMUSCULAR | Status: DC

## 2014-02-11 NOTE — Progress Notes (Signed)
Echo performed. 

## 2014-02-11 NOTE — Progress Notes (Signed)
Subjective:     Nicole [name] is a 38 y.o. female here for a routine exam.  Current complaints: follow up to discuss ultrasound results.  Personal health questionnaire reviewed: yes.   Gynecologic History No LMP recorded. Patient has had an injection. Contraception: condoms and Depo-Provera injections  Obstetric History OB History  Gravida Para Term Preterm AB SAB TAB Ectopic Multiple Living  3 2 1 1 1  0 0 1 0 2    # Outcome Date GA Lbr Len/2nd Weight Sex Delivery Anes PTL Lv  3 PRE 12/26/03    M SVD   Y     Comments: 32 wks, IOL for fetal distress  2 TRM 05/17/95    F SVD  Y Y  1 ECT                The following portions of the patient's history were reviewed and updated as appropriate: allergies, current medications, past family history, past medical history, past social history, past surgical history and problem list.  Review of Systems Pertinent items are noted in HPI.    Objective:    No exam performed today, Consult only.    Assessment:    Symptomatic uterine fibroids.   Plan:    Education reviewed: safe sex/STD prevention and management of uterine fibroids. Contraception: tubal ligation. Continue Depo Provera for Dysmenorrhea and AUB.

## 2014-02-14 IMAGING — US US TRANSVAGINAL NON-OB
1 series · 14 of 25 positions shown · non-contrast
Comparison: 02/20/2013

CLINICAL DATA: Lower abdominal and pelvic pain. Fibroids.  On Depo-
Provera.  Previous bilateral tubal ligation.

TRANSVAGINAL ULTRASOUND OF PELVIS
TECHNIQUE: Transvaginal ultrasound examination of the pelvis was
performed including evaluation of the uterus, ovaries, adnexal
regions, and pelvic cul-de-sac.

[Series 1: us transvaginal non-ob · 14 of 45 slices shown]
[im 1/45]
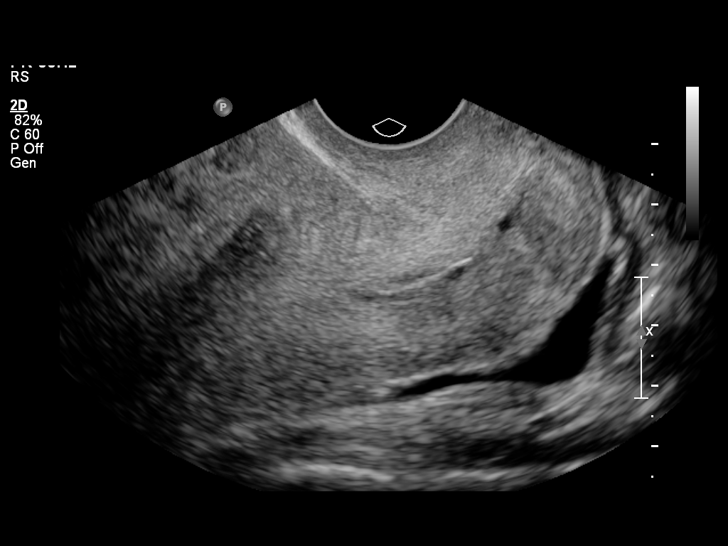
[im 4/45]
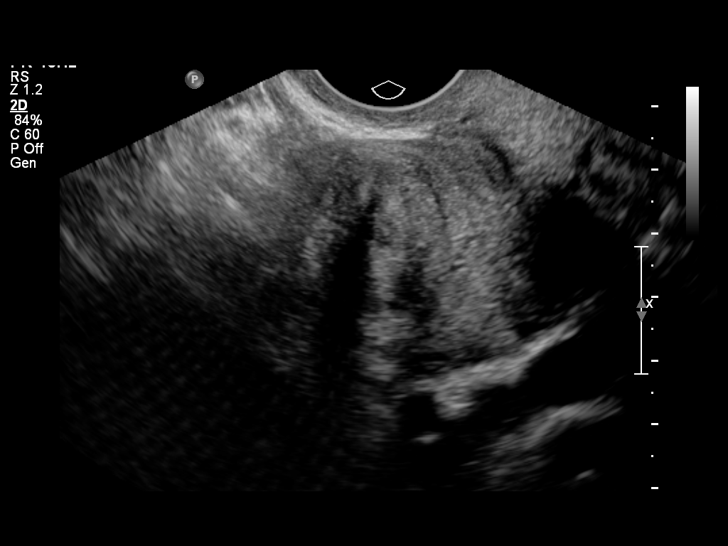
[im 8/45]
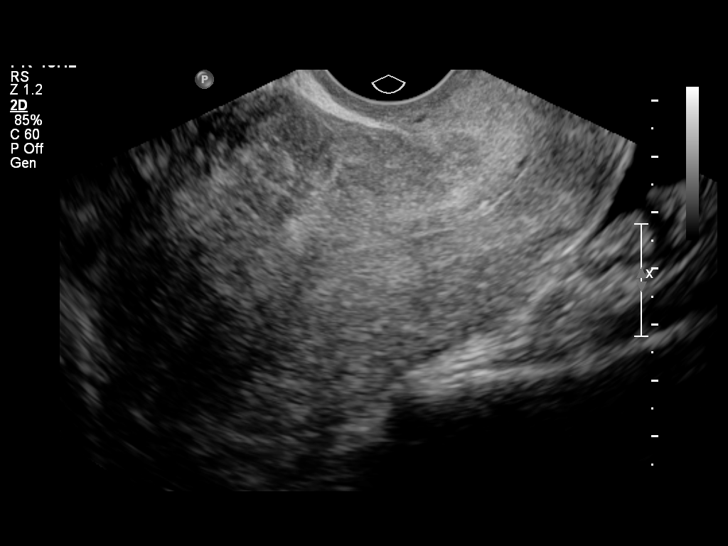
[im 12/45]
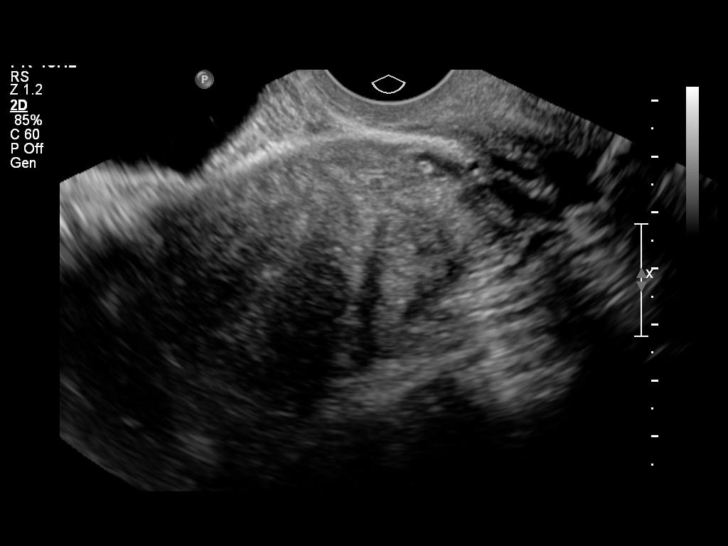
[im 15/45]
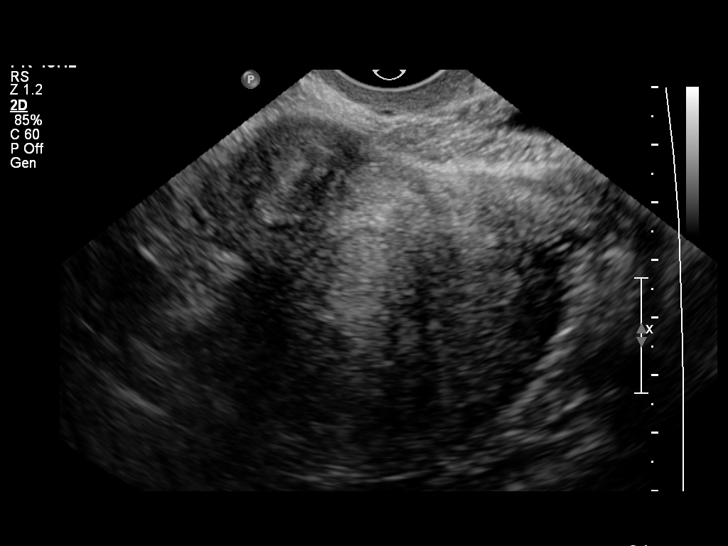
[im 17/45]
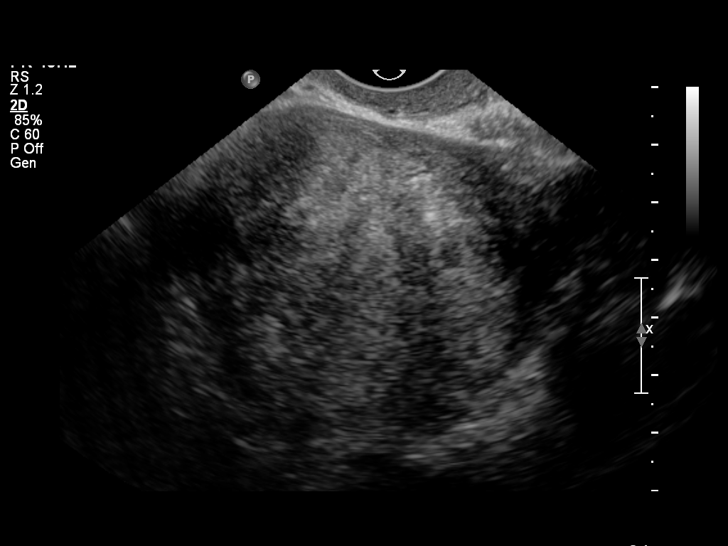
[im 21/45]
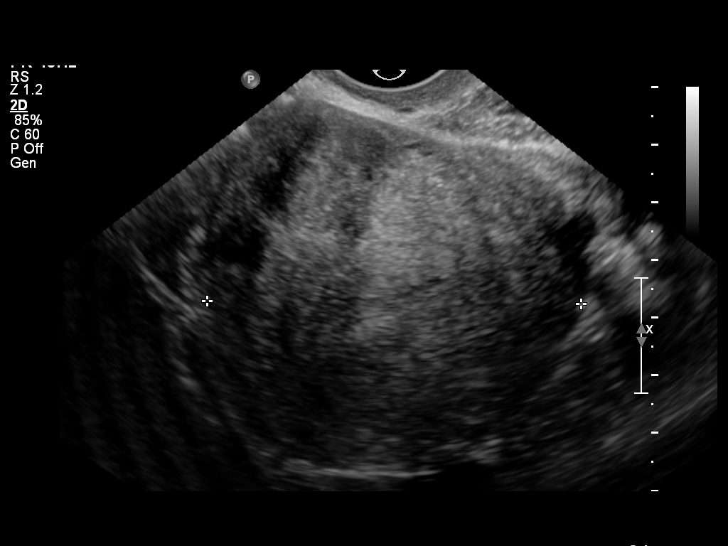
[im 24/45]
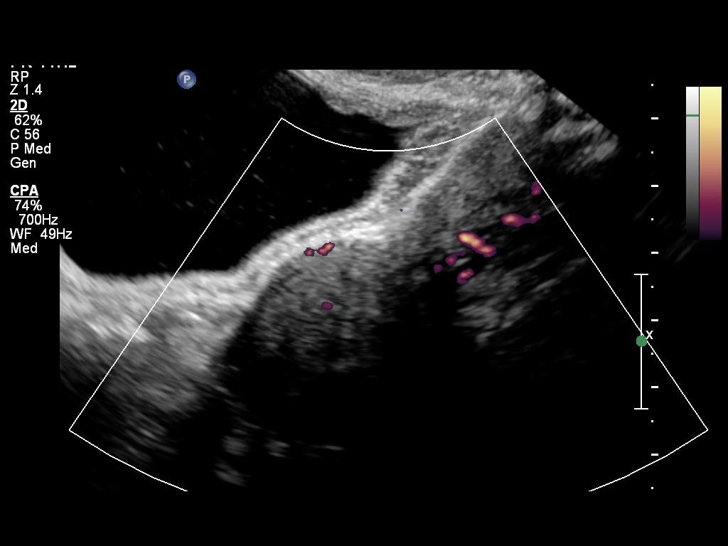
[im 28/45]
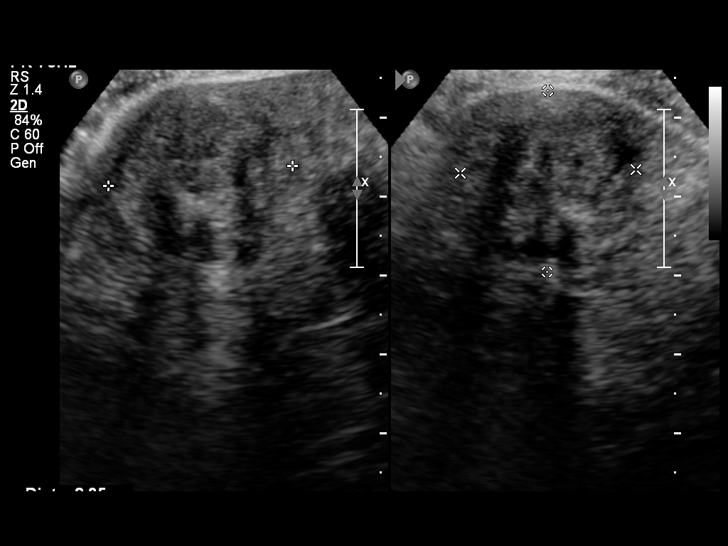
[im 30/45]
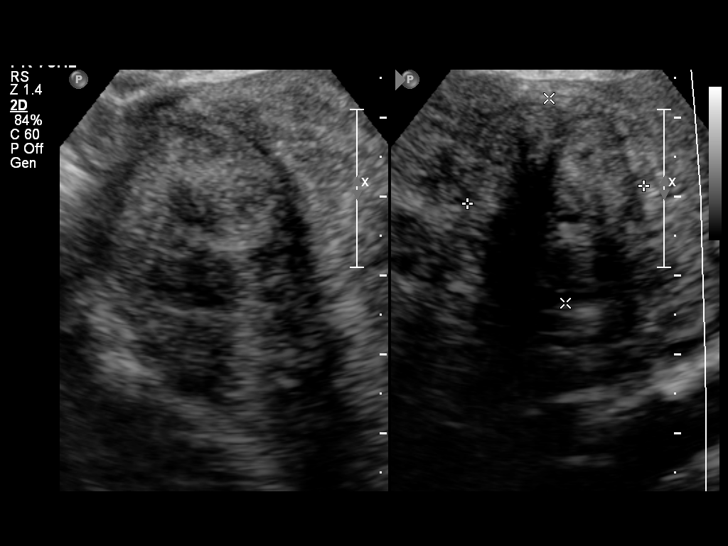
[im 34/45]
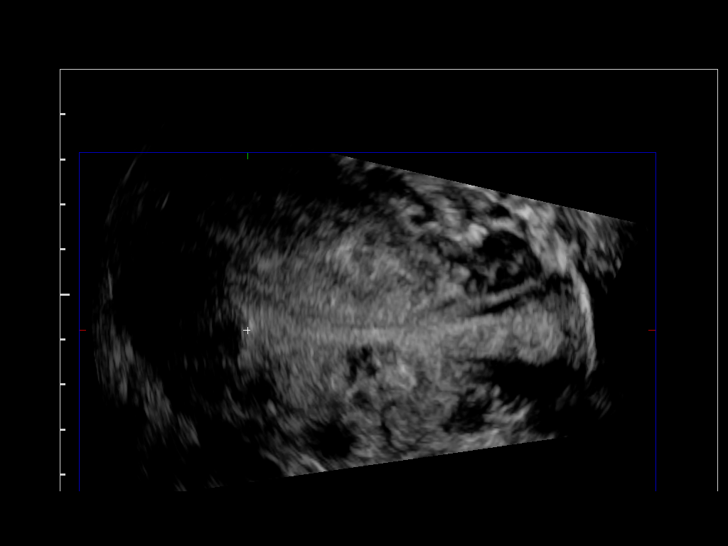
[im 37/45]
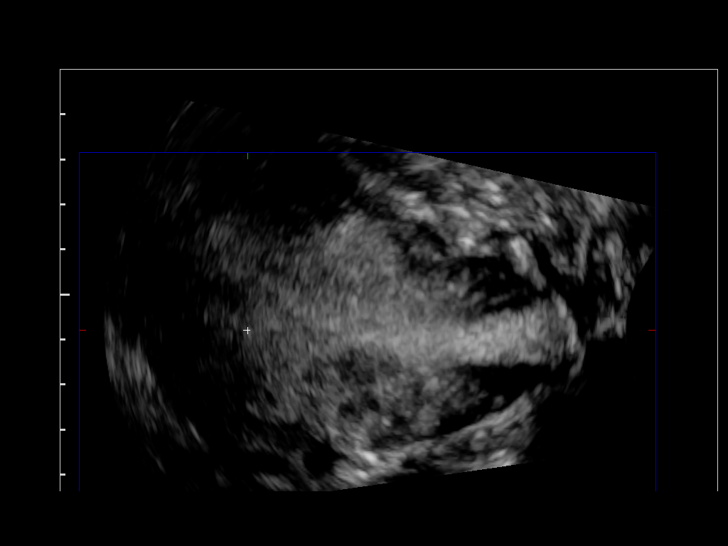
[im 41/45]
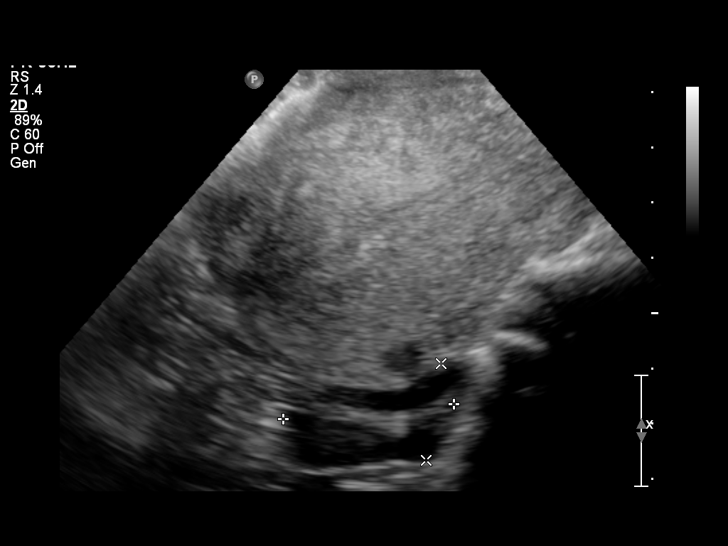
[im 45/45]
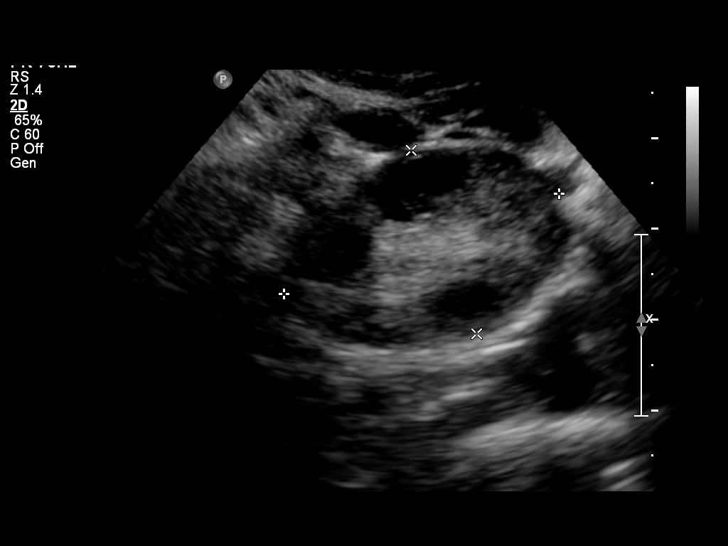

[14 of 25 positions shown; findings below may reference images not displayed]

FINDINGS: Uterus:  9.8 x 6.0 x 6.5 cm.  Multiple small uterine fibroids are
again seen.  At least four fibroids are distinctly visualized, and
these range in size from 2.4 cm to 3.0 cm in maximum diameter.  No
significant changes seen compared to prior exam.

Endometrium: Double layer thickness measures 4 mm transvaginally.
No focal lesion visualized.

Right ovary: 3.1 x 1.8 x 2.8 cm.  Normal appearance.  Previously
seen dominant follicle no longer visualized.

Left ovary: 3.2 x 2.2 x 2.8 cm.  Normal appearance.

Other Findings:  Trace amount of free fluid in the pelvic cul-de-
sac.
IMPRESSION: 1.  Multiple small uterine fibroids measuring up to 3.0 cm, without
significant change.
2.  Normal ovaries.  No adnexal mass identified.

## 2014-02-16 ENCOUNTER — Ambulatory Visit (INDEPENDENT_AMBULATORY_CARE_PROVIDER_SITE_OTHER): Payer: No Typology Code available for payment source | Admitting: Cardiology

## 2014-02-16 ENCOUNTER — Encounter: Payer: Self-pay | Admitting: Cardiology

## 2014-02-16 VITALS — BP 100/70 | HR 60 | Ht 65.0 in | Wt 242.0 lb

## 2014-02-16 DIAGNOSIS — I309 Acute pericarditis, unspecified: Secondary | ICD-10-CM

## 2014-02-16 DIAGNOSIS — I25810 Atherosclerosis of coronary artery bypass graft(s) without angina pectoris: Secondary | ICD-10-CM

## 2014-02-16 NOTE — Progress Notes (Signed)
Patient ID: AMIR GLAUS, female   DOB: 1976-03-03, 38 y.o.   MRN: [id number]    Patient Name: Nicole [name] Date of Encounter: 02/16/2014  Primary Care Provider:  Patricia Nettle, MD Primary Cardiologist:  Dorothy Spark  Problem List   Past Medical History  Diagnosis Date  . Anemia   . Asthma   . Blood transfusion 2011    r/t anemia  . Migraine   . Sickle cell trait   . Seizures     last sz Jan 2012  . Seizures   . Fibroid   . Tooth abscess    Past Surgical History  Procedure Laterality Date  . Tubal ligation Bilateral 2005   Allergies  Allergies  Allergen Reactions  . Penicillins Swelling  . Sulfa Antibiotics Rash   HPI  This is a 38 year old female with a history of asthma, sickle cell trait who presents with complaints of chest pain. Chest pain started about a month ago, and it prompted her to visit ED on 01/04/2014 where she was ruled out for ACS and referred to [place]. The patient states that her pain is sharp, retrosternal, worsens with certain positions in bed, but worsens with leaning forward and so does a deep breath. Her pain is not related to exertion. It's on and off but sometimes lasts for hours currently 5/10. Sometimes it radiates to her left arm. It is not associated with shortness of breath dizziness palpitations or syncope. Patient denies any flu-like symptoms prior to starting his chest pain. She has a significant family history of coronary artery disease, her mother had myocardial infarction at age of 100, her grandmother died of massive heart attack at age of 63.  After two weeks there is no improvement of chest pain, same in character.  Home Medications  Prior to Admission medications   Medication Sig Start Date End Date Taking? Authorizing Provider  carbamazepine (TEGRETOL) 200 MG tablet Take 0.5 tablets (100 mg total) by mouth 2 (two) times daily. 12/13/13  Yes Hoy Morn, MD  ibuprofen (ADVIL,MOTRIN) 600 MG tablet Take 800 mg by mouth every 6  (six) hours as needed. 01/05/14  Yes Merryl Hacker, MD  medroxyPROGESTERone (DEPO-PROVERA) 150 MG/ML injection Inject 150 mg into the muscle every 3 (three) months. November 2014   Yes Historical Provider, MD  phenytoin (DILANTIN) 100 MG ER capsule Take 100 mg by mouth 4 (four) times daily.    Yes Historical Provider, MD  topiramate (TOPAMAX) 50 MG tablet Take 50 mg by mouth 2 (two) times daily.     Yes Historical Provider, MD  albuterol (PROVENTIL HFA;VENTOLIN HFA) 108 (90 BASE) MCG/ACT inhaler Inhale 2 puffs into the lungs every 6 (six) hours as needed for shortness of breath. Shortness of breath    Historical Provider, MD    Family History  Family History  Problem Relation Age of Onset  . Anesthesia problems Neg Hx   . Hypertension Mother   . Diabetes Mother   . Cancer Mother   . Diabetes Father   . Diabetes Maternal Grandmother     Social History  History   Social History  . Marital Status: Single    Spouse Name: N/A    Number of Children: N/A  . Years of Education: N/A   Occupational History  . Not on file.   Social History Main Topics  . Smoking status: Former Research scientist (life sciences)  . Smokeless tobacco: Never Used  . Alcohol Use: No  . Drug Use: No  .  Sexual Activity: Yes    Partners: Male    Birth Control/ Protection: Condom, Injection, Surgical   Other Topics Concern  . Not on file   Social History Narrative  . No narrative on file     Review of Systems, as per HPI, otherwise negative General:  No chills, fever, night sweats or weight changes.  Cardiovascular:  No chest pain, dyspnea on exertion, edema, orthopnea, palpitations, paroxysmal nocturnal dyspnea. Dermatological: No rash, lesions/masses Respiratory: No cough, dyspnea Urologic: No hematuria, dysuria Abdominal:   No nausea, vomiting, diarrhea, bright red blood per rectum, melena, or hematemesis Neurologic:  No visual changes, wkns, changes in mental status. All other systems reviewed and are otherwise  negative except as noted above.  Physical Exam  Blood pressure 100/70, pulse 60, height _0  (1.651 m), weight 242 lb (109.77 kg).  General: Pleasant, NAD Psych: Normal affect. Neuro: Alert and oriented X 3. Moves all extremities spontaneously. HEENT: Normal  Neck: Supple without bruits or JVD. Lungs:  Resp regular and unlabored, CTA. Heart: RRR no s3, s4, or murmurs. Abdomen: Soft, non-tender, non-distended, BS + x 4.  Extremities: No clubbing, cyanosis or edema. DP/PT/Radials 2+ and equal bilaterally.  Labs:  No results found for this basename: CKTOTAL, CKMB, TROPONINI,  in the last 72 hours Lab Results  Component Value Date   WBC 6.4 01/20/2014   HGB 12.6 01/20/2014   HCT 38.6 01/20/2014   MCV 91.3 01/20/2014   PLT 282.0 01/20/2014   No results found for this basename: NA, K, CL, CO2, BUN, CREATININE, CALCIUM, LABALBU, PROT, BILITOT, ALKPHOS, ALT, AST, GLUCOSE,  in the last 168 hours No results found for this basename: CHOL,  HDL,  LDLCALC,  TRIG   Lab Results  Component Value Date   DDIMER <0.27 01/05/2014   Accessory Clinical Findings  Echocardiogram - 02/11/2014 Left ventricle: The cavity size was normal. Wall thickness was normal. Systolic function was normal. The estimated ejection fraction was in the range of 60% to 65%. Wall motion was normal; there were no regional wall motion abnormalities. The transmitral flow pattern was normal. The deceleration time of the early transmitral flow velocity was normal. The pulmonary vein flow pattern was normal. The tissue Doppler parameters were normal. Left ventricular diastolic function parameters were normal.  ------------------------------------------------------------ Aortic valve: Structurally normal valve. Trileaflet. Cusp separation was normal. Doppler: Transvalvular velocity was within the normal range. There was no stenosis. No regurgitation. ------------------------------------------------------------ Aorta: Aortic  root: The aortic root was normal in size. Ascending aorta: The ascending aorta was normal in size. ------------------------------------------------------------ Mitral valve: Structurally normal valve. Leaflet separation was normal. Doppler: Transvalvular velocity was within the normal range. There was no evidence for stenosis. No significant regurgitation. ------------------------------------------------------------ Left atrium: The atrium was normal in size. ---------------------------------------------------------- Atrial septum: No defect or patent foramen ovale was identified. ------------------------------------------------------------ Right ventricle: The cavity size was normal. Wall thickness was normal. Systolic function was normal.  ------------------------------------------------------------ Pulmonic valve: The valve appears to be grossly normal. Doppler: No significant regurgitation.  ------------------------------------------------------------ Pulmonary artery: The main pulmonary artery was normal-sized.  ------------------------------------------------------------ Right atrium: The atrium was normal in size.  ------------------------------------------------------------ Pericardium: The pericardium was normal in appearance. There was no pericardial effusion.  ------------------------------------------------------------ Systemic veins: Inferior vena cava: The vessel was normal in size; the respirophasic diameter changes were in the normal range (= 50%); findings are consistent with normal central venous pressure.  ECG - normal sinus rhythm, 60 beats per minute, normal EKG   Assessment & Plan  38 year old female with atypical chest pain suggestive of pericarditis. Family h/o very significant for CAD. No improvement of pain after 1 month of Colchicine and NSAIDS. Normal echocardiogram, no effusion, normal labs, including inflammatory markers (CRP< borderline ESR).  We will proceed with a stress MRI to rule out ischemia and pericarditis.   Follow up after CMR.  Dorothy Spark, MD, [facility] 02/16/2014, 10:55 AM

## 2014-02-16 NOTE — Patient Instructions (Signed)
Your physician has requested that you have a cardiac MRI with Stress. Cardiac MRI uses a computer to create images of your heart as its beating, producing both still and moving pictures of your heart and major blood vessels. For further information please visit [email or web address] Please follow the instruction sheet given to you today for more information.  Your physician recommends that you schedule a follow-up appointment in: after test.

## 2014-02-25 ENCOUNTER — Encounter: Payer: Self-pay | Admitting: Obstetrics

## 2014-03-04 ENCOUNTER — Encounter: Payer: Self-pay | Admitting: Obstetrics

## 2014-03-12 ENCOUNTER — Ambulatory Visit: Payer: No Typology Code available for payment source | Admitting: Obstetrics

## 2014-03-19 ENCOUNTER — Encounter: Payer: Self-pay | Admitting: Cardiology

## 2014-04-01 ENCOUNTER — Encounter (HOSPITAL_COMMUNITY): Payer: Self-pay | Admitting: Emergency Medicine

## 2014-04-01 ENCOUNTER — Emergency Department (HOSPITAL_COMMUNITY)
Admission: EM | Admit: 2014-04-01 | Discharge: 2014-04-01 | Disposition: A | Discharge status: Home / Self Care | Payer: No Typology Code available for payment source | Attending: Emergency Medicine | Admitting: Emergency Medicine

## 2014-04-01 DIAGNOSIS — Z79899 Other long term (current) drug therapy: Secondary | ICD-10-CM | POA: Insufficient documentation

## 2014-04-01 DIAGNOSIS — Z8719 Personal history of other diseases of the digestive system: Secondary | ICD-10-CM | POA: Insufficient documentation

## 2014-04-01 DIAGNOSIS — J45909 Unspecified asthma, uncomplicated: Secondary | ICD-10-CM | POA: Insufficient documentation

## 2014-04-01 DIAGNOSIS — Z87891 Personal history of nicotine dependence: Secondary | ICD-10-CM | POA: Insufficient documentation

## 2014-04-01 DIAGNOSIS — G43909 Migraine, unspecified, not intractable, without status migrainosus: Secondary | ICD-10-CM | POA: Insufficient documentation

## 2014-04-01 DIAGNOSIS — Z880 Allergy status to penicillin: Secondary | ICD-10-CM | POA: Insufficient documentation

## 2014-04-01 DIAGNOSIS — G40909 Epilepsy, unspecified, not intractable, without status epilepticus: Secondary | ICD-10-CM | POA: Insufficient documentation

## 2014-04-01 DIAGNOSIS — Z791 Long term (current) use of non-steroidal anti-inflammatories (NSAID): Secondary | ICD-10-CM | POA: Insufficient documentation

## 2014-04-01 DIAGNOSIS — Z8742 Personal history of other diseases of the female genital tract: Secondary | ICD-10-CM | POA: Insufficient documentation

## 2014-04-01 DIAGNOSIS — Z9851 Tubal ligation status: Secondary | ICD-10-CM | POA: Insufficient documentation

## 2014-04-01 DIAGNOSIS — Z862 Personal history of diseases of the blood and blood-forming organs and certain disorders involving the immune mechanism: Secondary | ICD-10-CM | POA: Insufficient documentation

## 2014-04-01 DIAGNOSIS — R1013 Epigastric pain: Secondary | ICD-10-CM | POA: Insufficient documentation

## 2014-04-01 LAB — CBC
HCT: 37.8 % (ref 36.0–46.0)
Hemoglobin: 13.2 g/dL (ref 12.0–15.0)
MCH: 29.5 pg (ref 26.0–34.0)
MCHC: 34.9 g/dL (ref 30.0–36.0)
MCV: 84.6 fL (ref 78.0–100.0)
Platelets: 252 10*3/uL (ref 150–400)
RBC: 4.47 MIL/uL (ref 3.87–5.11)
RDW: 12.4 % (ref 11.5–15.5)
WBC: 5.1 10*3/uL (ref 4.0–10.5)

## 2014-04-01 LAB — BASIC METABOLIC PANEL
BUN: 9 mg/dL (ref 6–23)
CO2: 23 mEq/L (ref 19–32)
Calcium: 8.9 mg/dL (ref 8.4–10.5)
Chloride: 102 mEq/L (ref 96–112)
Creatinine, Ser: 0.73 mg/dL (ref 0.50–1.10)
GFR calc Af Amer: >90 mL/min (ref >90)
GFR calc non Af Amer: >90 mL/min (ref >90)
Glucose, Bld: 91 mg/dL (ref 70–99)
Potassium: 4 mEq/L (ref 3.7–5.3)
Sodium: 136 mEq/L — ABNORMAL LOW (ref 137–147)

## 2014-04-01 LAB — LIPASE, BLOOD: Lipase: 52 U/L (ref 11–59)

## 2014-04-01 MED ORDER — DEXAMETHASONE SODIUM PHOSPHATE 10 MG/ML IJ SOLN
10.0000 mg | Freq: Once | INTRAMUSCULAR | Status: AC
  Administered 2014-04-01: 10 mg via INTRAVENOUS
  Filled 2014-04-01: qty 1

## 2014-04-01 MED ORDER — KETOROLAC TROMETHAMINE 60 MG/2ML IM SOLN
60.0000 mg | Freq: Once | INTRAMUSCULAR | Status: AC
  Administered 2014-04-01: 60 mg via INTRAMUSCULAR
  Filled 2014-04-01: qty 2

## 2014-04-01 MED ORDER — HYDROCODONE-ACETAMINOPHEN 5-325 MG PO TABS
2.0000 | ORAL_TABLET | Freq: Once | ORAL | Status: AC
  Administered 2014-04-01: 2 via ORAL
  Filled 2014-04-01: qty 2

## 2014-04-01 MED ORDER — SODIUM CHLORIDE 0.9 % IV BOLUS (SEPSIS)
1000.0000 mL | INTRAVENOUS | Status: AC
  Administered 2014-04-01: 1000 mL via INTRAVENOUS

## 2014-04-01 MED ORDER — DIPHENHYDRAMINE HCL 50 MG/ML IJ SOLN
25.0000 mg | Freq: Once | INTRAMUSCULAR | Status: AC
  Administered 2014-04-01: 25 mg via INTRAVENOUS
  Filled 2014-04-01: qty 1

## 2014-04-01 MED ORDER — METOCLOPRAMIDE HCL 5 MG/ML IJ SOLN
10.0000 mg | Freq: Once | INTRAMUSCULAR | Status: AC
  Administered 2014-04-01: 10 mg via INTRAVENOUS
  Filled 2014-04-01: qty 2

## 2014-04-01 MED ORDER — HYDROCODONE-ACETAMINOPHEN 5-325 MG PO TABS: 1.0000 | ORAL_TABLET | Freq: Four times a day (QID) | ORAL | Status: DC | PRN

## 2014-04-01 NOTE — ED Provider Notes (Signed)
CSN: [id number]     Arrival date & time 04/01/14  0809 History   First MD Initiated Contact with Patient 04/01/14 0818     Chief Complaint  Patient presents with  . Headache  . Abdominal Pain     (Consider location/radiation/quality/duration/timing/severity/associated sxs/prior Treatment) HPI PT is a 37yo female with hx of migraines, seizures, and acute appendicitis presenting to ED c/o intermittent migraine x4 weeks consistent with her previous migraines, unresolved by tylenol, motrin, or excedrin migraine. Headache is right sided, aching, throbbing, 5/10, worse with light and sound. Reports intermittent blurred vision.  Denies n/v. Denies seizure, reports last seizure was about 1 year ago. States she has not seen neurologist in about 1 year due to lack of insurance, and has not been taking prescription medications: topomax, dilantin, and tegretol for 78mo due to not seeing neurologist.  Denies falls or trauma. Pt also reports upper abdominal pain that is burning in nature. Does report taking OTC pain medication almost daily every 4-6 hours for headache.  Denies fever, n/v/d.   Past Medical History  Diagnosis Date  . Anemia   . Asthma   . Blood transfusion 2011    r/t anemia  . Migraine   . Sickle cell trait   . Seizures     last sz Jan 2012  . Seizures   . Fibroid   . Tooth abscess    Past Surgical History  Procedure Laterality Date  . Tubal ligation Bilateral 2005   Family History  Problem Relation Age of Onset  . Anesthesia problems Neg Hx   . Hypertension Mother   . Diabetes Mother   . Cancer Mother   . Diabetes Father   . Diabetes Maternal Grandmother    History  Substance Use Topics  . Smoking status: Former Research scientist (life sciences)  . Smokeless tobacco: Never Used  . Alcohol Use: No   OB History   Grav Para Term Preterm Abortions TAB SAB Ect Mult Living   3 2 1 1 1  0 0 1 0 2     Review of Systems  Constitutional: Negative for fever and chills.  Eyes: Positive for  photophobia and visual disturbance. Negative for pain and redness.  Respiratory: Negative for shortness of breath.   Cardiovascular: Negative for chest pain.  Gastrointestinal: Positive for abdominal pain ( epigastric). Negative for nausea, vomiting and diarrhea.  Musculoskeletal: Negative for neck pain and neck stiffness.  Neurological: Positive for headaches. Negative for dizziness, seizures, syncope, weakness and light-headedness.  All other systems reviewed and are negative.     Allergies  Penicillins and Sulfa antibiotics  Home Medications   Prior to Admission medications   Medication Sig Start Date End Date Taking? Authorizing Provider  albuterol (PROVENTIL HFA;VENTOLIN HFA) 108 (90 BASE) MCG/ACT inhaler Inhale 2 puffs into the lungs every 6 (six) hours as needed for shortness of breath. Shortness of breath    Historical Provider, MD  carbamazepine (TEGRETOL) 200 MG tablet Take 0.5 tablets (100 mg total) by mouth 2 (two) times daily. 12/13/13   Hoy Morn, MD  colchicine 0.6 MG tablet Take 1 tablet (0.6 mg total) by mouth 2 (two) times daily. 01/20/14   Dorothy Spark, MD  ibuprofen (ADVIL,MOTRIN) 600 MG tablet Take 800 mg by mouth every 6 (six) hours as needed. 01/05/14   Merryl Hacker, MD  indomethacin (INDOCIN) 50 MG capsule Take 1 capsule (50 mg total) by mouth 3 (three) times daily with meals. 01/20/14   Dorothy Spark, MD  medroxyPROGESTERone (DEPO-PROVERA) 150 MG/ML injection Inject 1 mL (150 mg total) into the muscle every 3 (three) months. 02/11/14   Shelly Bombard, MD  meloxicam (MOBIC) 15 MG tablet Take 1 tablet (15 mg total) by mouth daily. 02/08/14   Ruthell Rummage Dammen, PA-C  omeprazole (PRILOSEC) 20 MG capsule Take 1 capsule (20 mg total) by mouth 2 (two) times daily before a meal. 01/28/14   Shelly Bombard, MD  oxyCODONE (OXY IR/ROXICODONE) 5 MG immediate release tablet Take 10 mg by mouth every 6 (six) hours as needed for moderate pain or severe pain.    Historical  Provider, MD  phenytoin (DILANTIN) 100 MG ER capsule Take 100 mg by mouth 4 (four) times daily.     Historical Provider, MD  Prenatal Vit-Fe Fumarate-FA (PNV PRENATAL PLUS MULTIVITAMIN) 27-1 MG TABS Take 1 tablet by mouth daily before breakfast. 02/11/14   Shelly Bombard, MD  sucralfate (CARAFATE) 1 GM/10ML suspension Take 10 mLs (1 g total) by mouth 4 (four) times daily -  with meals and at bedtime. 01/28/14   Shelly Bombard, MD  topiramate (TOPAMAX) 50 MG tablet Take 50 mg by mouth 2 (two) times daily.      Historical Provider, MD   BP 106/66  Pulse 83  Temp(Src) 97.8 F (36.6 C) (Oral)  Resp 16  SpO2 99% Physical Exam  Nursing note and vitals reviewed. Constitutional: She is oriented to person, place, and time. She appears well-developed and well-nourished. No distress.  HENT:  Head: Normocephalic and atraumatic.  Eyes: Conjunctivae are normal. No scleral icterus.  Neck: Normal range of motion.  Cardiovascular: Normal rate, regular rhythm and normal heart sounds.   Pulmonary/Chest: Effort normal and breath sounds normal. No respiratory distress. She has no wheezes. She has no rales. She exhibits no tenderness.  Abdominal: Soft. Bowel sounds are normal. She exhibits no distension and no mass. There is tenderness. There is no rebound and no guarding.  Obese, soft, non-distended, mild epigastric tenderness.   Musculoskeletal: Normal range of motion.  Neurological: She is alert and oriented to person, place, and time. She has normal strength. No cranial nerve deficit or sensory deficit. She displays a negative Romberg sign. Coordination normal. GCS eye subscore is 4. GCS verbal subscore is 5. GCS motor subscore is 6.  CN II-XII grossly in tact. Alert and oriented. No ataxia or focal deficit.   Skin: Skin is warm and dry. She is not diaphoretic.    ED Course  Procedures (including critical care time) Labs Review Labs Reviewed  BASIC METABOLIC PANEL - Abnormal; Notable for the  following:    Sodium 136 (*)    All other components within normal limits  CBC  LIPASE, BLOOD    Imaging Review No results found.   EKG Interpretation None      MDM   Final diagnoses:  Migraine  Epigastric pain    Pt is a 38yo female with hx of migraines, seizures and acute appendicitis presenting to ED c/o intermittent headache consistent with previous migraines. Has not seen neurologist in 66yr, or taken medications for 20mo. No sudden onset.Neuro exam: unremarkable.   Also c/o epigastric pain with associated epigastric tenderness on exam. Abdominal exam otherwise unremarkable.   Not concerned for [facility], CVA/TIA or other intracranial bleed.  HA consistent with migraines per pt.  Will tx with migraine cocktail, IV fluids, reglan, benadryl, and decadron.  Due to hx of acute pancreatitis, will also get abdominal labs: CBC, BMP, and lipase.  If unremarkable, epigastric pain likely due to gastritis from OTC pain medications.   Pt given IV medications, however, unable to finish IV fluids due to difficulty keeping patent IV.  Pt able to keep down PO fluids and pain medication. Feels comfortable being discharged home. Advised to f/u with [facility] for recurrent migraines. Return precautions provided. Pt verbalized understanding and agreement with tx plan.  Discussed pt with Dr. Eulis Foster who agrees with plan.    Noland Fordyce, PA-C 04/01/14 1127

## 2014-04-01 NOTE — ED Notes (Signed)
Pt c/o headahce off and on for about 4 weeks and abd burning x 1 day. Denies n/v but does state she has blurred vision all this morning.

## 2014-04-01 NOTE — ED Provider Notes (Signed)
  Face-to-face evaluation   History: Daily HA x 1 month. Abdominal and CP for 2 weeks. Out of sz. Medication. Seeing Cardiology regarding CP  Physical exam: Alert, calm, in mild distress. Heart, RRR without murmur. Abdomen, soft, NTTP.    Medical screening examination/treatment/procedure(s) were conducted as a shared visit with non-physician practitioner(s) and myself.  I personally evaluated the patient during the encounter  Richarda Blade, MD 04/01/14 1725

## 2014-04-03 ENCOUNTER — Ambulatory Visit (HOSPITAL_COMMUNITY)
Admission: RE | Admit: 2014-04-03 | Discharge: 2014-04-03 | Disposition: A | Discharge status: Home / Self Care | Payer: No Typology Code available for payment source | Source: Ambulatory Visit | Attending: Cardiology | Admitting: Cardiology

## 2014-04-03 DIAGNOSIS — I2510 Atherosclerotic heart disease of native coronary artery without angina pectoris: Secondary | ICD-10-CM | POA: Insufficient documentation

## 2014-04-03 DIAGNOSIS — I309 Acute pericarditis, unspecified: Secondary | ICD-10-CM | POA: Insufficient documentation

## 2014-04-03 DIAGNOSIS — I25810 Atherosclerosis of coronary artery bypass graft(s) without angina pectoris: Secondary | ICD-10-CM

## 2014-04-03 MED ORDER — GADOBENATE DIMEGLUMINE 529 MG/ML IV SOLN
36.0000 mL | Freq: Once | INTRAVENOUS | Status: AC | PRN
  Administered 2014-04-03: 36 mL via INTRAVENOUS

## 2014-04-03 MED ORDER — REGADENOSON 0.4 MG/5ML IV SOLN
INTRAVENOUS | Status: AC
  Filled 2014-04-03: qty 5

## 2014-05-02 ENCOUNTER — Encounter (HOSPITAL_COMMUNITY): Payer: Self-pay | Admitting: Emergency Medicine

## 2014-05-02 ENCOUNTER — Emergency Department (HOSPITAL_COMMUNITY)
Admission: EM | Admit: 2014-05-02 | Discharge: 2014-05-03 | Disposition: A | Discharge status: Home / Self Care | Payer: No Typology Code available for payment source | Attending: Emergency Medicine | Admitting: Emergency Medicine

## 2014-05-02 DIAGNOSIS — Z791 Long term (current) use of non-steroidal anti-inflammatories (NSAID): Secondary | ICD-10-CM | POA: Insufficient documentation

## 2014-05-02 DIAGNOSIS — Z8669 Personal history of other diseases of the nervous system and sense organs: Secondary | ICD-10-CM | POA: Insufficient documentation

## 2014-05-02 DIAGNOSIS — Z862 Personal history of diseases of the blood and blood-forming organs and certain disorders involving the immune mechanism: Secondary | ICD-10-CM | POA: Insufficient documentation

## 2014-05-02 DIAGNOSIS — M79606 Pain in leg, unspecified: Secondary | ICD-10-CM

## 2014-05-02 DIAGNOSIS — Z880 Allergy status to penicillin: Secondary | ICD-10-CM | POA: Insufficient documentation

## 2014-05-02 DIAGNOSIS — J45909 Unspecified asthma, uncomplicated: Secondary | ICD-10-CM | POA: Insufficient documentation

## 2014-05-02 DIAGNOSIS — Z8742 Personal history of other diseases of the female genital tract: Secondary | ICD-10-CM | POA: Insufficient documentation

## 2014-05-02 DIAGNOSIS — Z79899 Other long term (current) drug therapy: Secondary | ICD-10-CM | POA: Insufficient documentation

## 2014-05-02 DIAGNOSIS — M79609 Pain in unspecified limb: Secondary | ICD-10-CM | POA: Insufficient documentation

## 2014-05-02 DIAGNOSIS — Z87891 Personal history of nicotine dependence: Secondary | ICD-10-CM | POA: Insufficient documentation

## 2014-05-02 MED ORDER — PREDNISONE 10 MG PO TABS: 40.0000 mg | ORAL_TABLET | Freq: Every day | ORAL | Status: DC

## 2014-05-02 MED ORDER — HYDROCODONE-ACETAMINOPHEN 5-325 MG PO TABS
1.0000 | ORAL_TABLET | Freq: Once | ORAL | Status: AC
  Administered 2014-05-02: 1 via ORAL
  Filled 2014-05-02: qty 1

## 2014-05-02 MED ORDER — HYDROCODONE-ACETAMINOPHEN 5-325 MG PO TABS: 1.0000 | ORAL_TABLET | Freq: Four times a day (QID) | ORAL | Status: DC | PRN

## 2014-05-02 NOTE — Discharge Instructions (Signed)

## 2014-05-02 NOTE — ED Provider Notes (Signed)
CSN: [id number]     Arrival date & time 05/02/14  2244 History   First MD Initiated Contact with Patient 05/02/14 2306     Chief Complaint  Patient presents with  . Foot Pain   HPI Comments: Patient is a 38 y.o. Female who presents to the [facility] ED with CC of Right foot pain x 3 weeks.  Patient states that she has had a burning pain in her foot with maximal tenderness over her first MTP.  She rates the pain as an 8/10 pain, which radiates up her foot to her mid shin.  She states that she has barely been able to tolerate wearing socks for the past couple weeks and has been able to ambulate with a limp.  Patient associated pain with mild swelling and warmth.  Patient has tried alternating 800 mg Motrin with Tylenol and has concommitently been using Mobic which was prescribed to her for shoulder pain.  She states that she has never had foot problems in the past.  She has a past medical history of sickle cell trait and seizures.  She also states that she has a family history of gout on both her mother and fathers side.    Patient is a 38 y.o. female presenting with lower extremity pain.  Foot Pain Associated symptoms include arthralgias and joint swelling. Pertinent negatives include no chills, fever, myalgias or rash.    Past Medical History  Diagnosis Date  . Anemia   . Asthma   . Blood transfusion 2011    r/t anemia  . Migraine   . Sickle cell trait   . Seizures     last sz Jan 2012  . Seizures   . Fibroid   . Tooth abscess    Past Surgical History  Procedure Laterality Date  . Tubal ligation Bilateral 2005   Family History  Problem Relation Age of Onset  . Anesthesia problems Neg Hx   . Hypertension Mother   . Diabetes Mother   . Cancer Mother   . Diabetes Father   . Diabetes Maternal Grandmother    History  Substance Use Topics  . Smoking status: Former Research scientist (life sciences)  . Smokeless tobacco: Never Used  . Alcohol Use: No   OB History   Grav Para Term Preterm Abortions TAB SAB Ect  Mult Living   3 2 1 1 1  0 0 1 0 2     Review of Systems  Constitutional: Negative for fever and chills.  Musculoskeletal: Positive for arthralgias, gait problem and joint swelling. Negative for back pain and myalgias.  Skin: Negative for color change, rash and wound.  All other systems reviewed and are negative.     Allergies  Penicillins and Sulfa antibiotics  Home Medications   Prior to Admission medications   Medication Sig Start Date End Date Taking? Authorizing Provider  albuterol (PROVENTIL HFA;VENTOLIN HFA) 108 (90 BASE) MCG/ACT inhaler Inhale 2 puffs into the lungs every 6 (six) hours as needed for shortness of breath. Shortness of breath    Historical Provider, MD  colchicine 0.6 MG tablet Take 1 tablet (0.6 mg total) by mouth 2 (two) times daily. 01/20/14   Dorothy Spark, MD  HYDROcodone-acetaminophen (NORCO/VICODIN) 5-325 MG per tablet Take 1-2 tablets by mouth every 6 (six) hours as needed for moderate pain or severe pain. 04/01/14   Noland Fordyce, PA-C  ibuprofen (ADVIL,MOTRIN) 200 MG tablet Take 800 mg by mouth every 6 (six) hours as needed for moderate pain.  Historical Provider, MD  indomethacin (INDOCIN) 50 MG capsule Take 1 capsule (50 mg total) by mouth 3 (three) times daily with meals. 01/20/14   Dorothy Spark, MD  medroxyPROGESTERone (DEPO-PROVERA) 150 MG/ML injection Inject 1 mL (150 mg total) into the muscle every 3 (three) months. 02/11/14   Shelly Bombard, MD  meloxicam (MOBIC) 15 MG tablet Take 1 tablet (15 mg total) by mouth daily. 02/08/14   Ruthell Rummage Dammen, PA-C  omeprazole (PRILOSEC) 20 MG capsule Take 1 capsule (20 mg total) by mouth 2 (two) times daily before a meal. 01/28/14   Shelly Bombard, MD  Prenatal Vit-Fe Fumarate-FA (PNV PRENATAL PLUS MULTIVITAMIN) 27-1 MG TABS Take 1 tablet by mouth daily before breakfast. 02/11/14   Shelly Bombard, MD  sucralfate (CARAFATE) 1 GM/10ML suspension Take 10 mLs (1 g total) by mouth 4 (four) times daily -  with  meals and at bedtime. 01/28/14   Shelly Bombard, MD   BP 107/73  Pulse 95  Temp(Src) 97.8 F (36.6 C) (Oral)  Resp 16  SpO2 100% Physical Exam  Nursing note and vitals reviewed. Constitutional: She is oriented to person, place, and time. She appears well-developed and well-nourished. No distress.  HENT:  Head: Normocephalic and atraumatic.  Eyes: Conjunctivae are normal. No scleral icterus.  Neck: Normal range of motion. Neck supple.  Cardiovascular: Normal rate, regular rhythm, normal heart sounds and intact distal pulses.   Pulmonary/Chest: Effort normal and breath sounds normal.  Musculoskeletal:  Patient ambulates with an antalgic gait to the right.  On inspection there is no obvious swelling, echymosis, warmth of the right foot and leg.  There is mild assymetry of the right leg in comparison to the left. Foot is tender to palpation over its entirity with maximal tenderness noted over the MTP and also the plantar fascial insertion to the calcaneous.  Full active range of motion is exhibited with toe flexion and extension, and ankle plantar and dorsiflexion.  Patient exhibits 5/5 strength in plantar and dorsiflexion of the ankle.  Negative anterior drawer of the right ankle.  2+ DP and PT pulses.  Sensation to light touch is intact.      Lymphadenopathy:    She has no cervical adenopathy.  Neurological: She is alert and oriented to person, place, and time.  Skin: Skin is warm and dry. She is not diaphoretic.  Psychiatric: She has a normal mood and affect. Her behavior is normal. Judgment and thought content normal.    ED Course  Procedures (including critical care time) Labs Review Labs Reviewed - No data to display  Imaging Review No results found.   EKG Interpretation None      MDM   Final diagnoses:  Leg pain   Patient presents with CC of 3 weeks of right foot and leg pain.  Based on patient presentation differential includes podagra and DVT.  Will treat patient  with prednisone, taper daily NSAID to Mobic only, and will prescribe Norco 5/325.  Will schedule outpatient venous duplex to rule out DVT.  Have low suspicion for DVT as patient's Wells score is less than 1 and patient has minimal risk factors.     Kenard Gower, PA-C 05/02/14 2353

## 2014-05-02 NOTE — ED Notes (Signed)
Patient is alert and oriented x3.  She is complaining of right foot pain that started 3 weeks and the pain intensified tonight.  Currently she rates her pain 8 of 10.

## 2014-05-03 ENCOUNTER — Ambulatory Visit (HOSPITAL_COMMUNITY)
Admission: RE | Admit: 2014-05-03 | Discharge: 2014-05-03 | Disposition: A | Discharge status: Home / Self Care | Payer: No Typology Code available for payment source | Source: Ambulatory Visit | Attending: Emergency Medicine | Admitting: Emergency Medicine

## 2014-05-03 DIAGNOSIS — M79609 Pain in unspecified limb: Secondary | ICD-10-CM

## 2014-05-03 NOTE — ED Provider Notes (Signed)
The pt is a 38 y/o female with hx of ? Gout (on meds by her PMD though pt is unaware of having personal hx of gout - she does have FHx of gout). She has had pain in the R foot at the MTP and the top of the R foot feels that it extends up the leg to the knee occasionally - burning pain. Sx are worse with ambulation and associated with mild swelling of the RLE below the knee. PE shows no tachycardia, no fever, no rash, no swelling and no warmth over the MTP on the R. She does have TTP over the R foot and ankle diffusely. She has good ROM of the foot joints but with pain. She is on [facility] - injection and she does smoke cigarettes. She doesa have asymetry of the legs at the calf - she will need [place] to r/o DVT - in the meantime, treat more agressively as a gouty pain with prednisone (consolidating nsaids) and hydrocodone (small am't). Pt agreeable to plan - appears stable - no signs of PE to necessitate w/u this evening.   Medical screening examination/treatment/procedure(s) were conducted as a shared visit with non-physician practitioner(s) and myself. I personally evaluated the patient during the encounter.   Johnna Acosta, MD 05/03/14 [id number]

## 2014-05-03 NOTE — Progress Notes (Signed)
VASCULAR LAB PRELIMINARY  PRELIMINARY  PRELIMINARY  PRELIMINARY  Right lower extremity venous Doppler completed.    Preliminary report:  There is no DVT or SVT noted in the right lower extremity.   Iantha Fallen, RVT 05/03/2014, 9:11 AM

## 2014-05-11 IMAGING — CR DG KNEE COMPLETE 4+V*R*
4 series · 4 of 4 positions shown · non-contrast
Comparison: None.

CLINICAL DATA: Right knee pain.

EXAM:
RIGHT KNEE - COMPLETE 4+ VIEW

[t knee ap right]
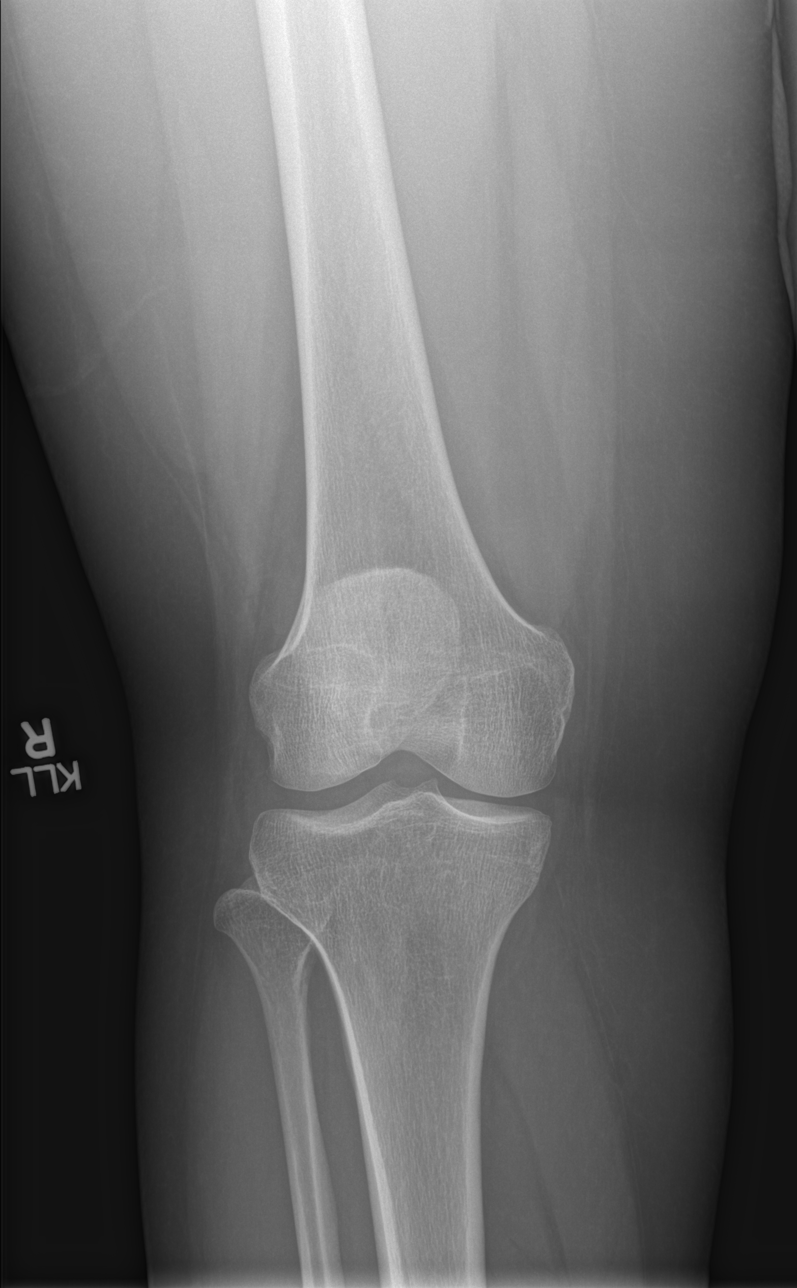

[t knee obl right (1 of 2)]
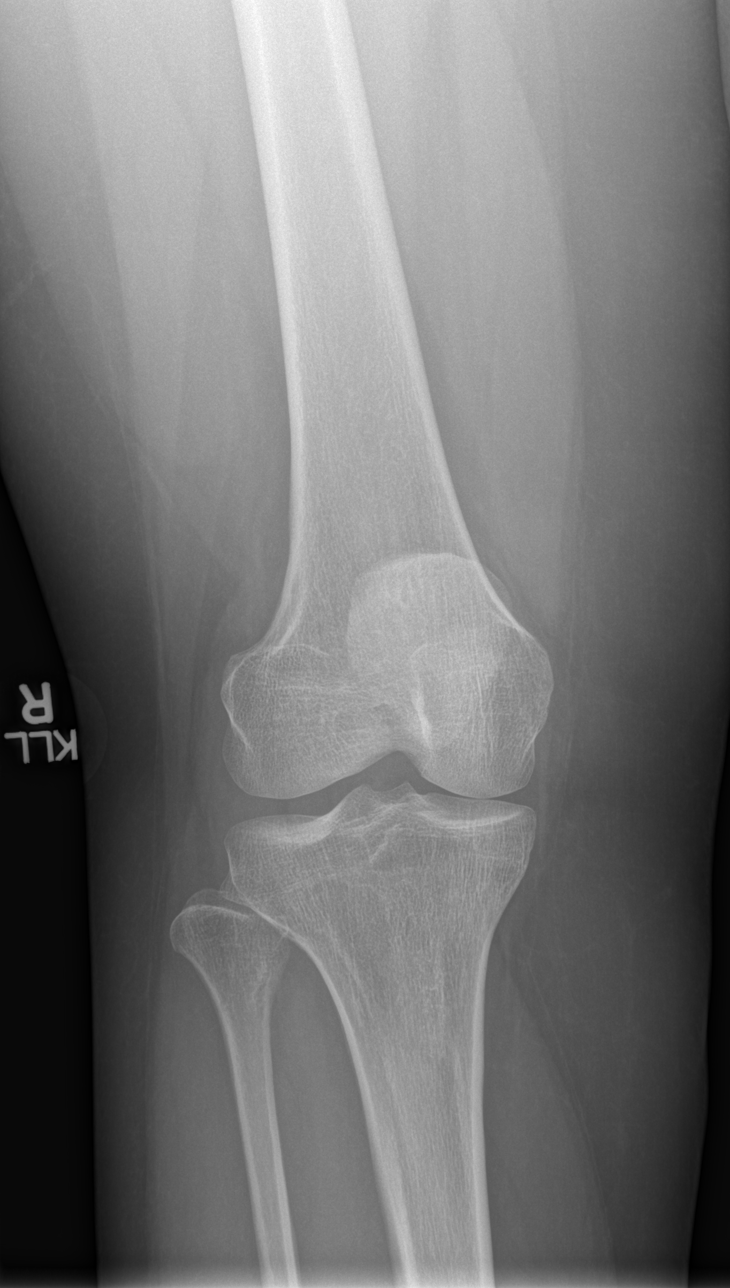

[t knee obl right (2 of 2)]
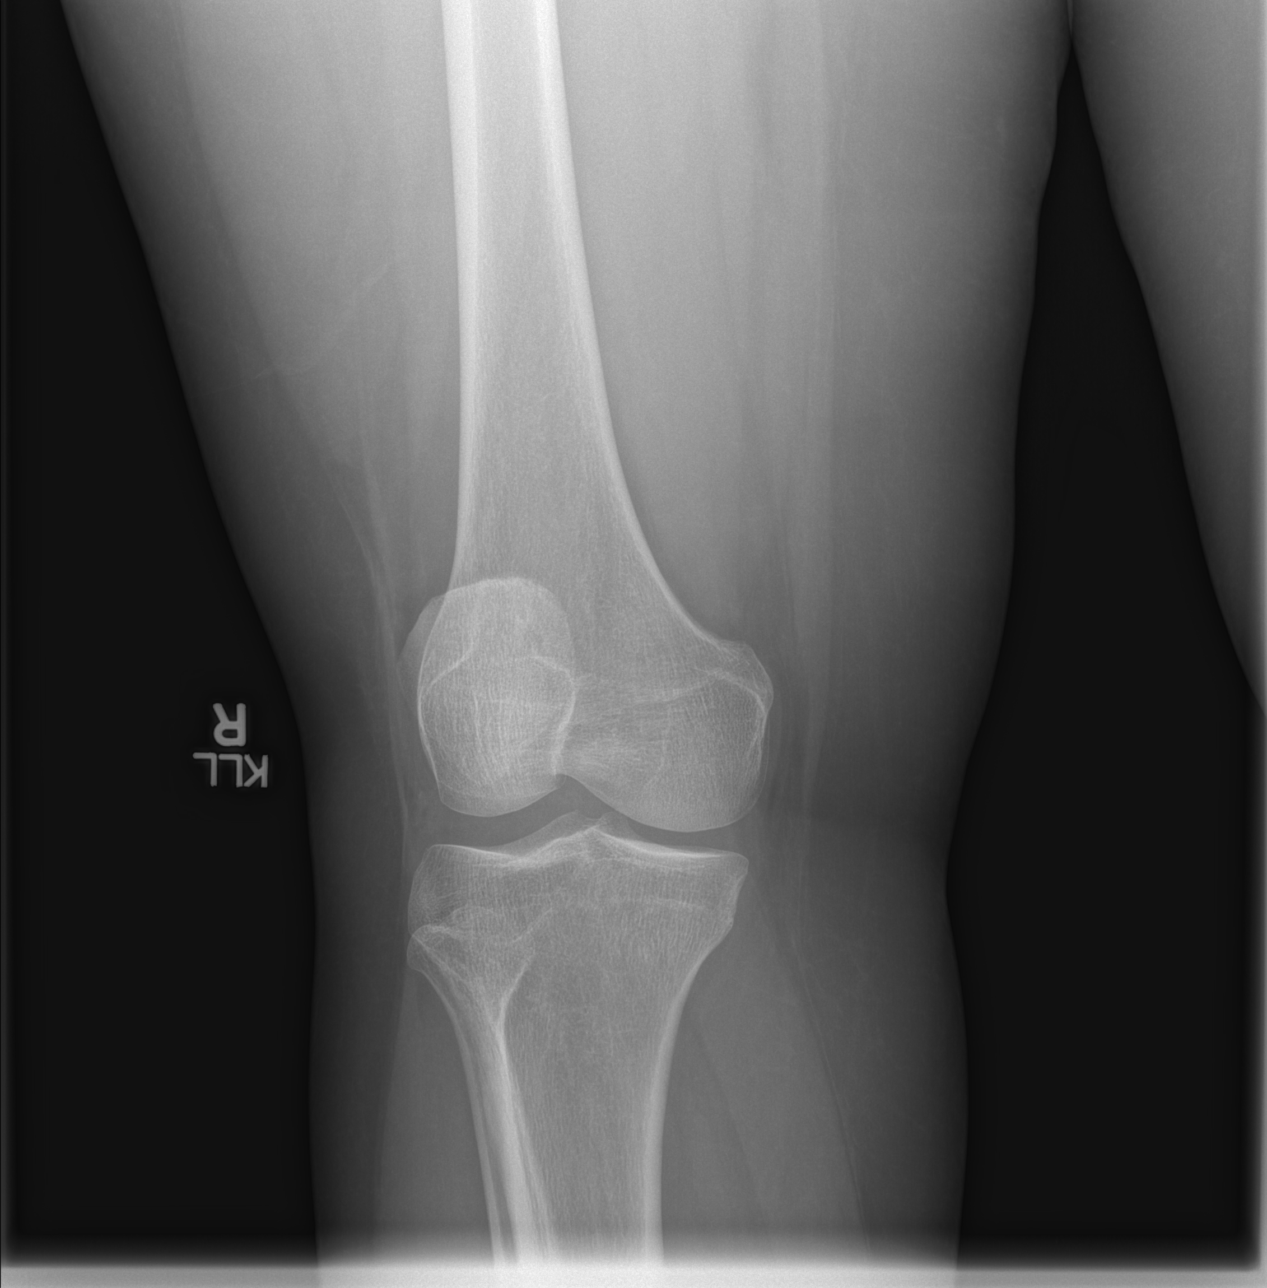

[t knee lat right]
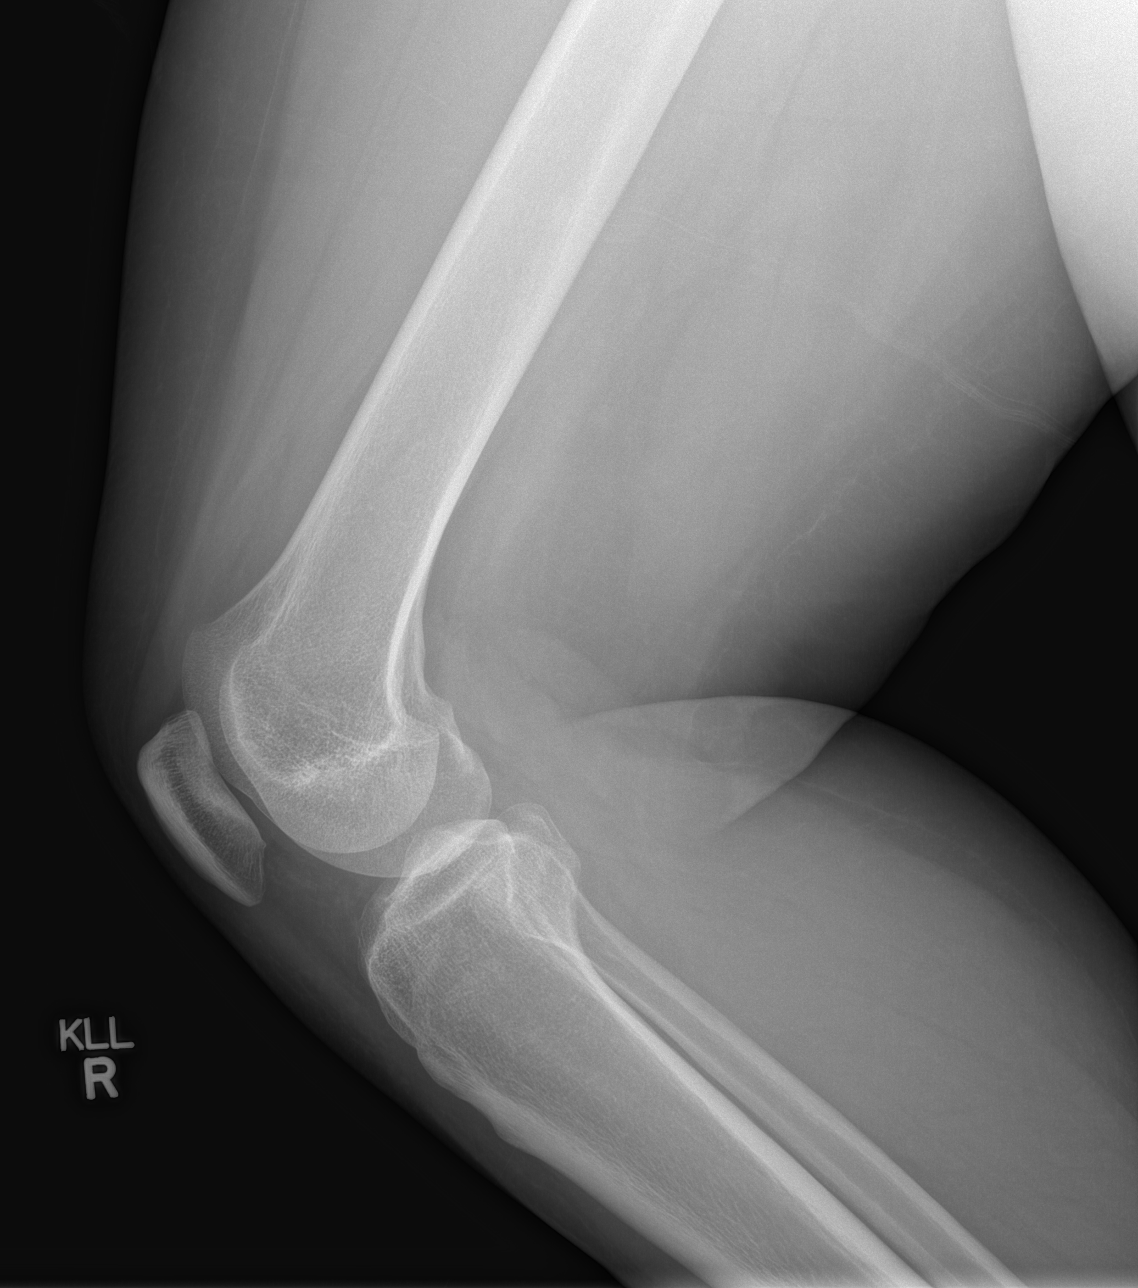

[4 of 4 positions shown; findings below may reference images not displayed]

FINDINGS: There is no evidence of fracture, dislocation, or joint effusion.
There is no evidence of arthropathy or other focal bone abnormality.
Soft tissues are unremarkable.
IMPRESSION: Negative.

## 2014-05-14 ENCOUNTER — Emergency Department (HOSPITAL_COMMUNITY)
Admission: EM | Admit: 2014-05-14 | Discharge: 2014-05-14 | Disposition: A | Discharge status: Home / Self Care | Payer: No Typology Code available for payment source | Attending: Emergency Medicine | Admitting: Emergency Medicine

## 2014-05-14 ENCOUNTER — Encounter (HOSPITAL_COMMUNITY): Payer: Self-pay | Admitting: Emergency Medicine

## 2014-05-14 DIAGNOSIS — M545 Low back pain, unspecified: Secondary | ICD-10-CM | POA: Insufficient documentation

## 2014-05-14 DIAGNOSIS — J45909 Unspecified asthma, uncomplicated: Secondary | ICD-10-CM | POA: Insufficient documentation

## 2014-05-14 DIAGNOSIS — IMO0002 Reserved for concepts with insufficient information to code with codable children: Secondary | ICD-10-CM | POA: Insufficient documentation

## 2014-05-14 DIAGNOSIS — Y929 Unspecified place or not applicable: Secondary | ICD-10-CM | POA: Insufficient documentation

## 2014-05-14 DIAGNOSIS — Z8679 Personal history of other diseases of the circulatory system: Secondary | ICD-10-CM | POA: Insufficient documentation

## 2014-05-14 DIAGNOSIS — Z79899 Other long term (current) drug therapy: Secondary | ICD-10-CM | POA: Insufficient documentation

## 2014-05-14 DIAGNOSIS — Z862 Personal history of diseases of the blood and blood-forming organs and certain disorders involving the immune mechanism: Secondary | ICD-10-CM | POA: Insufficient documentation

## 2014-05-14 DIAGNOSIS — Y939 Activity, unspecified: Secondary | ICD-10-CM | POA: Insufficient documentation

## 2014-05-14 DIAGNOSIS — N898 Other specified noninflammatory disorders of vagina: Secondary | ICD-10-CM

## 2014-05-14 DIAGNOSIS — Z8669 Personal history of other diseases of the nervous system and sense organs: Secondary | ICD-10-CM | POA: Insufficient documentation

## 2014-05-14 DIAGNOSIS — N949 Unspecified condition associated with female genital organs and menstrual cycle: Secondary | ICD-10-CM | POA: Insufficient documentation

## 2014-05-14 DIAGNOSIS — X58XXXA Exposure to other specified factors, initial encounter: Secondary | ICD-10-CM | POA: Insufficient documentation

## 2014-05-14 DIAGNOSIS — Z880 Allergy status to penicillin: Secondary | ICD-10-CM | POA: Insufficient documentation

## 2014-05-14 LAB — URINALYSIS, ROUTINE W REFLEX MICROSCOPIC
Bilirubin Urine: NEGATIVE
Glucose, UA: NEGATIVE mg/dL
Hgb urine dipstick: NEGATIVE
Ketones, ur: NEGATIVE mg/dL
LEUKOCYTES UA: NEGATIVE
NITRITE: NEGATIVE
PH: 5.5 (ref 5.0–8.0)
Protein, ur: NEGATIVE mg/dL
Specific Gravity, Urine: 1.025 (ref 1.005–1.030)
UROBILINOGEN UA: 1 mg/dL (ref 0.0–1.0)

## 2014-05-14 LAB — WET PREP, GENITAL
Clue Cells Wet Prep HPF POC: NONE SEEN
TRICH WET PREP: NONE SEEN
WBC WET PREP: NONE SEEN
YEAST WET PREP: NONE SEEN

## 2014-05-14 MED ORDER — ONDANSETRON 4 MG PO TBDP
4.0000 mg | ORAL_TABLET | Freq: Once | ORAL | Status: AC
  Administered 2014-05-14: 4 mg via ORAL
  Filled 2014-05-14: qty 1

## 2014-05-14 MED ORDER — STERILE WATER FOR INJECTION IJ SOLN
INTRAMUSCULAR | Status: AC
  Filled 2014-05-14: qty 10

## 2014-05-14 MED ORDER — AZITHROMYCIN 250 MG PO TABS
1000.0000 mg | ORAL_TABLET | Freq: Once | ORAL | Status: AC
  Administered 2014-05-14: 1000 mg via ORAL
  Filled 2014-05-14: qty 4

## 2014-05-14 MED ORDER — HYDROCODONE-ACETAMINOPHEN 5-325 MG PO TABS: 1.0000 | ORAL_TABLET | ORAL | Status: DC | PRN

## 2014-05-14 MED ORDER — OXYCODONE-ACETAMINOPHEN 5-325 MG PO TABS
2.0000 | ORAL_TABLET | Freq: Once | ORAL | Status: AC
  Administered 2014-05-14: 2 via ORAL
  Filled 2014-05-14: qty 2

## 2014-05-14 MED ORDER — CEFTRIAXONE SODIUM 250 MG IJ SOLR
250.0000 mg | Freq: Once | INTRAMUSCULAR | Status: AC
  Administered 2014-05-14: 250 mg via INTRAMUSCULAR
  Filled 2014-05-14: qty 250

## 2014-05-14 MED ORDER — FLUCONAZOLE 200 MG PO TABS: 200.0000 mg | ORAL_TABLET | Freq: Every day | ORAL | Status: AC

## 2014-05-14 NOTE — Discharge Instructions (Signed)
Vaginitis Vaginitis is an inflammation of the vagina. It is most often caused by a change in the normal balance of the bacteria and yeast that live in the vagina. This change in balance causes an overgrowth of certain bacteria or yeast, which causes the inflammation. There are different types of vaginitis, but the most common types are:  Bacterial vaginosis.  Yeast infection (candidiasis).  Trichomoniasis vaginitis. This is a sexually transmitted infection (STI).  Viral vaginitis.  Atropic vaginitis.  Allergic vaginitis. CAUSES  The cause depends on the type of vaginitis. Vaginitis can be caused by:  Bacteria (bacterial vaginosis).  Yeast (yeast infection).  A parasite (trichomoniasis vaginitis)  A virus (viral vaginitis).  Low hormone levels (atrophic vaginitis). Low hormone levels can occur during pregnancy, breastfeeding, or after menopause.  Irritants, such as bubble baths, scented tampons, and feminine sprays (allergic vaginitis). Other factors can change the normal balance of the yeast and bacteria that live in the vagina. These include:  Antibiotic medicines.  Poor hygiene.  Diaphragms, vaginal sponges, spermicides, birth control pills, and intrauterine devices (IUD).  Sexual intercourse.  Infection.  Uncontrolled diabetes.  A weakened immune system. SYMPTOMS  Symptoms can vary depending on the cause of the vaginitis. Common symptoms include:  Abnormal vaginal discharge.  The discharge is white, gray, or yellow with bacterial vaginosis.  The discharge is thick, white, and cheesy with a yeast infection.  The discharge is frothy and yellow or greenish with trichomoniasis.  A bad vaginal odor.  The odor is fishy with bacterial vaginosis.  Vaginal itching, pain, or swelling.  Painful intercourse.  Pain or burning when urinating. Sometimes, there are no symptoms. TREATMENT  Treatment will vary depending on the type of infection.   Bacterial  vaginosis and trichomoniasis are often treated with antibiotic creams or pills.  Yeast infections are often treated with antifungal medicines, such as vaginal creams or suppositories.  Viral vaginitis has no cure, but symptoms can be treated with medicines that relieve discomfort. Your sexual partner should be treated as well.  Atrophic vaginitis may be treated with an estrogen cream, pill, suppository, or vaginal ring. If vaginal dryness occurs, lubricants and moisturizing creams may help. You may be told to avoid scented soaps, sprays, or douches.  Allergic vaginitis treatment involves quitting the use of the product that is causing the problem. Vaginal creams can be used to treat the symptoms. HOME CARE INSTRUCTIONS   Take all medicines as directed by your caregiver.  Keep your genital area clean and dry. Avoid soap and only rinse the area with water.  Avoid douching. It can remove the healthy bacteria in the vagina.  Do not use tampons or have sexual intercourse until your vaginitis has been treated. Use sanitary pads while you have vaginitis.  Wipe from front to back. This avoids the spread of bacteria from the rectum to the vagina.  Let air reach your genital area.  Wear cotton underwear to decrease moisture buildup.  Avoid wearing underwear while you sleep until your vaginitis is gone.  Avoid tight pants and underwear or nylons without a cotton panel.  Take off wet clothing (especially bathing suits) as soon as possible.  Use mild, non-scented products. Avoid using irritants, such as:  Scented feminine sprays.  Fabric softeners.  Scented detergents.  Scented tampons.  Scented soaps or bubble baths.  Practice safe sex and use condoms. Condoms may prevent the spread of trichomoniasis and viral vaginitis. SEEK MEDICAL CARE IF:   You have abdominal pain.  You   have a fever or persistent symptoms for more than 2 3 days.  You have a fever and your symptoms suddenly  get worse. Document Released: 09/24/2007 Document Revised: 08/21/2012 Document Reviewed: 05/09/2012 [employer] Patient Information 2014 [employer].  

## 2014-05-14 NOTE — ED Provider Notes (Signed)
Medical screening examination/treatment/procedure(s) were performed by non-physician practitioner and as supervising physician I was immediately available for consultation/collaboration.   EKG Interpretation None        Wandra Arthurs, MD 05/14/14 [phone]

## 2014-05-14 NOTE — Progress Notes (Signed)
  CARE MANAGEMENT ED NOTE 05/14/2014  Patient:  Nicole [name], Nicole [name]   Account Number:  [id number]  Date Initiated:  05/14/2014  Documentation initiated by:  Jackelyn Poling  Subjective/Objective Assessment:   38 yr old 52 covered female with 7 ED visits in last 6 months c/o vaginal discharge & suprapubic pain. started prednisone over a week ago for her feet & developed she thought was yeast infection & pcp of vaginal discharge & suprapubic     Subjective/Objective Assessment Detail:   pain low back pain and discharged with vaginal discomfort. She is married and only sexually active with one partner. She reports having her tubes tied and taking the Depakote shot  pcp katraina nelson     Action/Plan:   entered in f/u with [employer] customer service for OB GYN on f/u instructions   Action/Plan Detail:   Anticipated DC Date:  05/14/2014     Status Recommendation to Physician:   Result of Recommendation:    Other ED [facility]  Other  PCP issues  Outpatient Services - Pt will follow up    Choice offered to / List presented to:            Status of service:  Completed, signed off  ED Comments:   ED Comments Detail:

## 2014-05-14 NOTE — ED Notes (Signed)
Patient with lower abdominal pain since Saturday.  She feels nauseated but has been taking Phenergan that she already has at home.  Denies vomiting and diarrhea.  Denies fever but is reporting chills, fatigue, and body aches.

## 2014-05-14 NOTE — ED Notes (Signed)
Pt has been on steroids for her feet.  Started having what seemed like yeast infection.  Has treated at home with no relief.  Pt states she has a discharge but no noted odor.  Abdominal pain to lower abdomen and painful to touch.  Also has abrasion-like wounds to left breast that she would like looked at.

## 2014-05-14 NOTE — ED Provider Notes (Signed)
CSN: [id number]     Arrival date & time 05/14/14  1052 History   First MD Initiated Contact with Patient 05/14/14 1110     Chief Complaint  Patient presents with  . Abdominal Pain  . Abrasion     (Consider location/radiation/quality/duration/timing/severity/associated sxs/prior Treatment) HPI  Patient presents to the emergency department complaining of vaginal discharge and suprapubic pain. She reports having started prednisone over a week ago for her feet and developed she thought was yeast infection and her primary care Dr. advised to take Monistat over-the-counter. She reports is not working and progressively getting worse. She's got suprapubic pain low back pain and discharged with vaginal discomfort. She is married and only sexually active with one partner. She reports having her tubes tied and taking the Depakote shot. She denies having fevers, weakness, nausea, vomiting, diarrhea.   Past Medical History  Diagnosis Date  . Anemia   . Asthma   . Blood transfusion 2011    r/t anemia  . Migraine   . Sickle cell trait   . Seizures     last sz Jan 2012  . Seizures   . Fibroid   . Tooth abscess    Past Surgical History  Procedure Laterality Date  . Tubal ligation Bilateral 2005   Family History  Problem Relation Age of Onset  . Anesthesia problems Neg Hx   . Hypertension Mother   . Diabetes Mother   . Cancer Mother   . Diabetes Father   . Diabetes Maternal Grandmother    History  Substance Use Topics  . Smoking status: Former Research scientist (life sciences)  . Smokeless tobacco: Never Used  . Alcohol Use: No   OB History   Grav Para Term Preterm Abortions TAB SAB Ect Mult Living   3 2 1 1 1  0 0 1 0 2     Review of Systems   Review of Systems  Gen: no weight loss, fevers, chills, night sweats  Eyes: no discharge or drainage, no occular pain or visual changes  Nose: no epistaxis or rhinorrhea  Mouth: no dental pain, no sore throat  Neck: no neck pain  Lungs:No wheezing, coughing or  hemoptysis CV: no chest pain, palpitations, dependent edema or orthopnea  Abd: no abdominal pain, nausea, vomiting, diarrhea GU: no dysuria or gross hematuria  + Vaginal discharge MSK:  No muscle weakness or pain Neuro: no headache, no focal neurologic deficits  Skin: no rash or wounds Psyche: no complaints    Allergies  Penicillins and Sulfa antibiotics  Home Medications   Prior to Admission medications   Medication Sig Start Date End Date Taking? Authorizing Provider  acetaminophen (TYLENOL) 325 MG tablet Take 650 mg by mouth every 6 (six) hours as needed for mild pain.   Yes Historical Provider, MD  albuterol (PROVENTIL HFA;VENTOLIN HFA) 108 (90 BASE) MCG/ACT inhaler Inhale 2 puffs into the lungs every 6 (six) hours as needed for shortness of breath. Shortness of breath   Yes Historical Provider, MD  colchicine 0.6 MG tablet Take 1 tablet (0.6 mg total) by mouth 2 (two) times daily. 01/20/14  Yes Dorothy Spark, MD  diphenhydrAMINE (BENADRYL) 25 mg capsule Take 25 mg by mouth every 4 (four) hours as needed for allergies.   Yes Historical Provider, MD  HYDROcodone-acetaminophen (NORCO/VICODIN) 5-325 MG per tablet Take 1-2 tablets by mouth every 6 (six) hours as needed for moderate pain or severe pain. 04/01/14  Yes Noland Fordyce, PA-C  ibuprofen (ADVIL,MOTRIN) 200 MG tablet Take 600 mg  by mouth every 6 (six) hours as needed for moderate pain.    Yes Historical Provider, MD  indomethacin (INDOCIN) 50 MG capsule Take 1 capsule (50 mg total) by mouth 3 (three) times daily with meals. 01/20/14  Yes Dorothy Spark, MD  medroxyPROGESTERone (DEPO-PROVERA) 150 MG/ML injection Inject 1 mL (150 mg total) into the muscle every 3 (three) months. 02/11/14  Yes Shelly Bombard, MD  meloxicam (MOBIC) 15 MG tablet Take 1 tablet (15 mg total) by mouth daily. 02/08/14  Yes Ruthell Rummage Dammen, PA-C  omeprazole (PRILOSEC) 20 MG capsule Take 1 capsule (20 mg total) by mouth 2 (two) times daily before a meal.  01/28/14  Yes Shelly Bombard, MD  Prenatal Vit-Fe Fumarate-FA (PNV PRENATAL PLUS MULTIVITAMIN) 27-1 MG TABS Take 1 tablet by mouth daily before breakfast. 02/11/14  Yes Shelly Bombard, MD  sucralfate (CARAFATE) 1 GM/10ML suspension Take 10 mLs (1 g total) by mouth 4 (four) times daily -  with meals and at bedtime. 01/28/14  Yes Shelly Bombard, MD  fluconazole (DIFLUCAN) 200 MG tablet Take 1 tablet (200 mg total) by mouth daily. 05/14/14 05/21/14  Marlynn Hinckley Marilu Favre, PA-C  HYDROcodone-acetaminophen (NORCO/VICODIN) 5-325 MG per tablet Take 1-2 tablets by mouth every 4 (four) hours as needed. 05/14/14   Taiden Raybourn Marilu Favre, PA-C   BP 114/74  Pulse 72  Temp(Src) 98.9 F (37.2 C) (Oral)  Resp 20  SpO2 97% Physical Exam  Nursing note and vitals reviewed. Constitutional: She appears well-developed and well-nourished. No distress.  HENT:  Head: Normocephalic and atraumatic.  Eyes: Pupils are equal, round, and reactive to light.  Neck: Normal range of motion. Neck supple.  Cardiovascular: Normal rate and regular rhythm.   Pulmonary/Chest: Effort normal.    Abdominal: Soft.  Genitourinary: Uterus is tender. Uterus is not enlarged. Cervix exhibits motion tenderness. Cervix exhibits no discharge and no friability. There is tenderness around the vagina. Vaginal discharge found.  Neurological: She is alert.  Skin: Skin is warm and dry.    ED Course  Procedures (including critical care time) Labs Review Labs Reviewed  WET PREP, GENITAL  GC/CHLAMYDIA PROBE AMP  URINALYSIS, ROUTINE W REFLEX MICROSCOPIC    Imaging Review No results found.   EKG Interpretation None      MDM   Final diagnoses:  Vaginal discharge    The patients wet prep came back normal but her cervix was erythematous and tender, she had cottage cheese discharge. i wlll treat her symptomatically for pelvic infection and wait for cultures to return.   Rx: diflucan for home as well as pain medications.  38 y.o.Osvaldo Human  's evaluation in the Emergency Department is complete. It has been determined that no acute conditions requiring further emergency intervention are present at this time. The patient/guardian have been advised of the diagnosis and plan. We have discussed signs and symptoms that warrant return to the ED, such as changes or worsening in symptoms.  Vital signs are stable at discharge. Filed Vitals:   05/14/14 1057  BP: 114/74  Pulse: 72  Temp: 98.9 F (37.2 C)  Resp: 20    Patient/guardian has voiced understanding and agreed to follow-up with the PCP or specialist.    Linus Mako, PA-C 05/14/14 1248

## 2014-05-16 LAB — GC/CHLAMYDIA PROBE AMP
CT Probe RNA: NEGATIVE
GC Probe RNA: NEGATIVE

## 2014-05-19 ENCOUNTER — Telehealth: Payer: Self-pay | Admitting: Cardiology

## 2014-05-19 NOTE — Telephone Encounter (Signed)
Pt contacted with Dr Francesca Oman recommendation for her to follow-up with her PCP or her Orthopedic Doctor for new onset of gout.  Pt verbalized understanding and agrees with this plan.

## 2014-05-19 NOTE — Telephone Encounter (Signed)
Pt calling stating she was seen in the ED for pain in bilateral feet, and was diagnosed with gout.  Pt states that the ED Doctor told her to follow-up with her Cardiologist for gout.  Pt does see a Orthopedic Doctor regularly.  Pt denies any cardiac issues at this time Informed pt that I send this message to Dr Meda Coffee for further review and recommendation.  Pt verbalized understanding and agrees with this plan.

## 2014-05-19 NOTE — Telephone Encounter (Signed)
New message     Pt has gout in her foot.  The ER gave her prednisone and told her to call her cardiologist.  He foot is worse.  She has finished the prednisone.  Please advise on what she can do now

## 2014-05-29 ENCOUNTER — Telehealth: Payer: Self-pay | Admitting: Cardiology

## 2014-05-29 NOTE — Telephone Encounter (Signed)
Spoke with pt b/c she feels like she is having chest wall pain. She states that it started last night.  States she has this especially when she takes a deep breath in. She is upset but is calming as we converse.  States she has taken motrin 800mg  but it did not help. She does not feel like she needs to go to the ED at this time. She does have the Colchine & Indomethacin that was prescribed but has not taken it. She will take these in addition to her motrin.  If it does not resolve she will possibly go to the ED but at this time she will take her medication as direction.  Forwarded to Dr. Meda Coffee for review. Horton Chin RN

## 2014-05-29 NOTE — Telephone Encounter (Signed)
Patient called in crying hysterically and said she had SOB and her chest was hurting. I called into triage and Debbie spoke with patient.

## 2014-06-03 ENCOUNTER — Emergency Department (HOSPITAL_COMMUNITY)
Admission: EM | Admit: 2014-06-03 | Discharge: 2014-06-04 | Disposition: A | Discharge status: Home / Self Care | Payer: No Typology Code available for payment source | Attending: Emergency Medicine | Admitting: Emergency Medicine

## 2014-06-03 ENCOUNTER — Encounter (HOSPITAL_COMMUNITY): Payer: Self-pay | Admitting: Emergency Medicine

## 2014-06-03 DIAGNOSIS — R519 Headache, unspecified: Secondary | ICD-10-CM

## 2014-06-03 DIAGNOSIS — Z862 Personal history of diseases of the blood and blood-forming organs and certain disorders involving the immune mechanism: Secondary | ICD-10-CM | POA: Insufficient documentation

## 2014-06-03 DIAGNOSIS — Z79899 Other long term (current) drug therapy: Secondary | ICD-10-CM | POA: Insufficient documentation

## 2014-06-03 DIAGNOSIS — Z87891 Personal history of nicotine dependence: Secondary | ICD-10-CM | POA: Insufficient documentation

## 2014-06-03 DIAGNOSIS — Z8669 Personal history of other diseases of the nervous system and sense organs: Secondary | ICD-10-CM | POA: Insufficient documentation

## 2014-06-03 DIAGNOSIS — G40909 Epilepsy, unspecified, not intractable, without status epilepticus: Secondary | ICD-10-CM | POA: Insufficient documentation

## 2014-06-03 DIAGNOSIS — R5381 Other malaise: Secondary | ICD-10-CM | POA: Insufficient documentation

## 2014-06-03 DIAGNOSIS — J018 Other acute sinusitis: Secondary | ICD-10-CM

## 2014-06-03 DIAGNOSIS — Z8742 Personal history of other diseases of the female genital tract: Secondary | ICD-10-CM | POA: Insufficient documentation

## 2014-06-03 DIAGNOSIS — J45909 Unspecified asthma, uncomplicated: Secondary | ICD-10-CM | POA: Insufficient documentation

## 2014-06-03 DIAGNOSIS — M791 Myalgia, unspecified site: Secondary | ICD-10-CM

## 2014-06-03 DIAGNOSIS — R5383 Other fatigue: Principal | ICD-10-CM

## 2014-06-03 DIAGNOSIS — R531 Weakness: Secondary | ICD-10-CM

## 2014-06-03 DIAGNOSIS — R51 Headache: Secondary | ICD-10-CM

## 2014-06-03 DIAGNOSIS — R569 Unspecified convulsions: Secondary | ICD-10-CM

## 2014-06-03 DIAGNOSIS — Z880 Allergy status to penicillin: Secondary | ICD-10-CM | POA: Insufficient documentation

## 2014-06-03 DIAGNOSIS — J329 Chronic sinusitis, unspecified: Secondary | ICD-10-CM | POA: Insufficient documentation

## 2014-06-03 DIAGNOSIS — Z791 Long term (current) use of non-steroidal anti-inflammatories (NSAID): Secondary | ICD-10-CM | POA: Insufficient documentation

## 2014-06-03 DIAGNOSIS — Z8719 Personal history of other diseases of the digestive system: Secondary | ICD-10-CM | POA: Insufficient documentation

## 2014-06-03 DIAGNOSIS — IMO0001 Reserved for inherently not codable concepts without codable children: Secondary | ICD-10-CM | POA: Insufficient documentation

## 2014-06-03 NOTE — ED Notes (Signed)
Pt states she can not recall the last time she took her seizure medications.

## 2014-06-03 NOTE — ED Notes (Signed)
Per EMS: pt coming from home with generalized weakness, nausea, vomiting today. Weakness started a hour ago. Pt states similar symptoms when she is about to have a seizure. Pt reports being off seizure medications for a couple of months. Pt A&Ox4, respirations equal and unlabored, skin warm and dry

## 2014-06-04 ENCOUNTER — Emergency Department (HOSPITAL_COMMUNITY): Payer: No Typology Code available for payment source

## 2014-06-04 DIAGNOSIS — R569 Unspecified convulsions: Secondary | ICD-10-CM

## 2014-06-04 LAB — COMPREHENSIVE METABOLIC PANEL
ALT: 14 U/L (ref 0–35)
AST: 16 U/L (ref 0–37)
Albumin: 3.7 g/dL (ref 3.5–5.2)
Alkaline Phosphatase: 82 U/L (ref 39–117)
BILIRUBIN TOTAL: 0.3 mg/dL (ref 0.3–1.2)
BUN: 4 mg/dL — ABNORMAL LOW (ref 6–23)
CO2: 20 meq/L (ref 19–32)
Calcium: 9.1 mg/dL (ref 8.4–10.5)
Chloride: 104 mEq/L (ref 96–112)
Creatinine, Ser: 0.75 mg/dL (ref 0.50–1.10)
GLUCOSE: 96 mg/dL (ref 70–99)
POTASSIUM: 3.5 meq/L — AB (ref 3.7–5.3)
Sodium: 140 mEq/L (ref 137–147)
Total Protein: 6.9 g/dL (ref 6.0–8.3)

## 2014-06-04 LAB — CBC WITH DIFFERENTIAL/PLATELET
BASOS ABS: 0.2 10*3/uL — AB (ref 0.0–0.1)
Basophils Relative: 2 % — ABNORMAL HIGH (ref 0–1)
Eosinophils Absolute: 0.4 10*3/uL (ref 0.0–0.7)
Eosinophils Relative: 5 % (ref 0–5)
HCT: 35.2 % — ABNORMAL LOW (ref 36.0–46.0)
Hemoglobin: 11.9 g/dL — ABNORMAL LOW (ref 12.0–15.0)
LYMPHS ABS: 1.3 10*3/uL (ref 0.7–4.0)
LYMPHS PCT: 17 % (ref 12–46)
MCH: 28.7 pg (ref 26.0–34.0)
MCHC: 33.8 g/dL (ref 30.0–36.0)
MCV: 85 fL (ref 78.0–100.0)
Monocytes Absolute: 0.7 10*3/uL (ref 0.1–1.0)
Monocytes Relative: 8 % (ref 3–12)
Neutro Abs: 5.5 10*3/uL (ref 1.7–7.7)
Neutrophils Relative %: 68 % (ref 43–77)
PLATELETS: 260 10*3/uL (ref 150–400)
RBC: 4.14 MIL/uL (ref 3.87–5.11)
RDW: 12.8 % (ref 11.5–15.5)
WBC: 8 10*3/uL (ref 4.0–10.5)

## 2014-06-04 LAB — URINALYSIS, ROUTINE W REFLEX MICROSCOPIC
BILIRUBIN URINE: NEGATIVE
Glucose, UA: NEGATIVE mg/dL
KETONES UR: NEGATIVE mg/dL
Nitrite: NEGATIVE
PROTEIN: NEGATIVE mg/dL
Specific Gravity, Urine: 1.014 (ref 1.005–1.030)
Urobilinogen, UA: 0.2 mg/dL (ref 0.0–1.0)
pH: 6 (ref 5.0–8.0)

## 2014-06-04 LAB — URINE MICROSCOPIC-ADD ON

## 2014-06-04 LAB — LIPASE, BLOOD: LIPASE: 34 U/L (ref 11–59)

## 2014-06-04 LAB — POC URINE PREG, ED: Preg Test, Ur: NEGATIVE

## 2014-06-04 LAB — SEDIMENTATION RATE: Sed Rate: 12 mm/hr (ref 0–22)

## 2014-06-04 LAB — TROPONIN I: Troponin I: <0.3 ng/mL (ref <0.30)

## 2014-06-04 MED ORDER — SODIUM CHLORIDE 0.9 % IV SOLN
1000.0000 mg | Freq: Once | INTRAVENOUS | Status: AC
  Administered 2014-06-04: 1000 mg via INTRAVENOUS
  Filled 2014-06-04: qty 10

## 2014-06-04 MED ORDER — DIPHENHYDRAMINE HCL 50 MG/ML IJ SOLN
25.0000 mg | Freq: Once | INTRAMUSCULAR | Status: AC
  Administered 2014-06-04: 25 mg via INTRAVENOUS
  Filled 2014-06-04: qty 1

## 2014-06-04 MED ORDER — MOMETASONE FUROATE 50 MCG/ACT NA SUSP: 2.0000 | Freq: Two times a day (BID) | NASAL | Status: DC

## 2014-06-04 MED ORDER — AZITHROMYCIN 250 MG PO TABS: ORAL_TABLET | ORAL | Status: DC

## 2014-06-04 MED ORDER — KETOROLAC TROMETHAMINE 30 MG/ML IJ SOLN
30.0000 mg | Freq: Once | INTRAMUSCULAR | Status: AC
  Administered 2014-06-04: 30 mg via INTRAVENOUS
  Filled 2014-06-04: qty 1

## 2014-06-04 MED ORDER — LEVETIRACETAM 500 MG PO TABS: 500.0000 mg | ORAL_TABLET | Freq: Two times a day (BID) | ORAL | Status: DC

## 2014-06-04 MED ORDER — METOCLOPRAMIDE HCL 5 MG/ML IJ SOLN
10.0000 mg | Freq: Once | INTRAMUSCULAR | Status: AC
  Administered 2014-06-04: 10 mg via INTRAVENOUS
  Filled 2014-06-04: qty 2

## 2014-06-04 MED ORDER — LORAZEPAM 2 MG/ML IJ SOLN
1.0000 mg | Freq: Once | INTRAMUSCULAR | Status: AC
  Administered 2014-06-04: 1 mg via INTRAVENOUS
  Filled 2014-06-04: qty 1

## 2014-06-04 NOTE — Consult Note (Addendum)
Neurology Consultation Reason for Consult: ? Seizure Referring Physician: Margaretha Seeds  CC: seizure  History is obtained from:patient, husband  HPI: Nicole [name] is a 38 y.o. female with a history of seizures treated in the past with tegretol, dilantin, and topiramate who presents with seizure after being off of medicines for several months. She was apparently having an argument earlier tonight and then was with her son earlier when she had a seizure, though he is not available.    She has had an overnight EEG study at [facility] where they apparently captured a spell, but I do not have these records to confirm this.   She has had generalized malaise since Sunday, and had a fever earlier tonight.    ROS: A 14 point ROS was performed and is negative except as noted in the HPI.  Past Medical History  Diagnosis Date  . Anemia   . Asthma   . Blood transfusion 2011    r/t anemia  . Migraine   . Sickle cell trait   . Seizures     last sz Jan 2012  . Seizures   . Fibroid   . Tooth abscess     Family History: Mother - htn  Social History: Tob: former smoker  Exam: Current vital signs: BP 128/80  Pulse 94  Temp(Src) 100.1 F (37.8 C) (Rectal)  Resp 12  SpO2 97% Vital signs in last 24 hours: Temp:  [98.6 F (37 C)-100.1 F (37.8 C)] 100.1 F (37.8 C) (06/25 0057) Pulse Rate:  [90-95] 94 (06/25 0230) Resp:  [12-20] 12 (06/25 0230) BP: (110-128)/(72-83) 128/80 mmHg (06/25 0230) SpO2:  [97 %-99 %] 97 % (06/25 0230)  General: in bed, NAD CV: RRR Mental Status: Patient is awake, alert, oriented to person, place, month, year, and situation. She has an odd affect with slow responses and stuttering. At times, her speech is more clear.  No signs of aphasia or neglect Cranial Nerves: II: Visual Fields are full. Pupils are equal, round, and reactive to light.  Discs are difficult to visualize. III,IV, VI: EOMI without ptosis or diploplia. When asked about diploplia, she  suddenly develops convergence spasm which was not present on initial EOM testing.  V: Facial sensation is symmetric to temperature VII: Facial movement is symmetric.  VIII: hearing is intact to voice X: Uvula elevates symmetrically XI: Shoulder shrug is symmetric. XII: tongue is midline without atrophy or fasciculations.  Motor: Tone is normal. Bulk is normal. She has significant give-way strenght in all 4 extremities. She has a markedly inconsistent exam.  Sensory: Sensation is symmetric to light touch and temperature in the arms and legs. Deep Tendon Reflexes: 2+ and symmetric in the biceps and patellae.  Cerebellar: Unable to perform due to weakness Gait: Not assessed due to multiple medical monitors in ED setting.   I have reviewed labs in epic and the results pertinent to this consultation are: cmp - unremarkable  I have reviewed the images obtained: CT - no acute intracranial findings.   Impression: 38 yo F with a history of seizures since her early 42s with breakthrough seizure. She has been non-compliant with medication and has a possible sinusitis as well. She has some findings on exam concerning for a non-physiological etiology including convergence spasm, inconsistent exam, and inconsistent stuttering speech. She does have a longstanding history of seizures for which she is supposed to be on medication, and in this setting I would favor restarting her on an AED. I do not  have the records from [facility] to review at this time, but this would be helpful information to guide therapy down the line. If her weakness improves, I do not think that she would gain much from an inpatient admission.   Given her age, I would favor keppra to tegretol or dilantin and discussed with the patient who is amenable to trying it.   Recommendations: 1) Keppra 500mg  BID following 1mg  load.  2) referral for outpatient neurology follow up.      Roland Rack, MD [facility] [phone]  If 7pm- 7am, please page neurology on call as listed in [place].

## 2014-06-04 NOTE — Discharge Instructions (Signed)
Fatigue Fatigue is a feeling of tiredness, lack of energy, lack of motivation, or feeling tired all the time. Having enough rest, good nutrition, and reducing stress will normally reduce fatigue. Consult your caregiver if it persists. The nature of your fatigue will help your caregiver to find out its cause. The treatment is based on the cause.  CAUSES  There are many causes for fatigue. Most of the time, fatigue can be traced to one or more of your habits or routines. Most causes fit into one or more of three general areas. They are: Lifestyle problems  Sleep disturbances.  Overwork.  Physical exertion.  Unhealthy habits.  Poor eating habits or eating disorders.  Alcohol and/or drug use .  Lack of proper nutrition (malnutrition). Psychological problems  Stress and/or anxiety problems.  Depression.  Grief.  Boredom. Medical Problems or Conditions  Anemia.  Pregnancy.  Thyroid gland problems.  Recovery from major surgery.  Continuous pain.  Emphysema or asthma that is not well controlled  Allergic conditions.  Diabetes.  Infections (such as mononucleosis).  Obesity.  Sleep disorders, such as sleep apnea.  Heart failure or other heart-related problems.  Cancer.  Kidney disease.  Liver disease.  Effects of certain medicines such as antihistamines, cough and cold remedies, prescription pain medicines, heart and blood pressure medicines, drugs used for treatment of cancer, and some antidepressants. SYMPTOMS  The symptoms of fatigue include:   Lack of energy.  Lack of drive (motivation).  Drowsiness.  Feeling of indifference to the surroundings. DIAGNOSIS  The details of how you feel help guide your caregiver in finding out what is causing the fatigue. You will be asked about your present and past health condition. It is important to review all medicines that you take, including prescription and non-prescription items. A thorough exam will be done.  You will be questioned about your feelings, habits, and normal lifestyle. Your caregiver may suggest blood tests, urine tests, or other tests to look for common medical causes of fatigue.  TREATMENT  Fatigue is treated by correcting the underlying cause. For example, if you have continuous pain or depression, treating these causes will improve how you feel. Similarly, adjusting the dose of certain medicines will help in reducing fatigue.  HOME CARE INSTRUCTIONS   Try to get the required amount of good sleep every night.  Eat a healthy and nutritious diet, and drink enough water throughout the day.  Practice ways of relaxing (including yoga or meditation).  Exercise regularly.  Make plans to change situations that cause stress. Act on those plans so that stresses decrease over time. Keep your work and personal routine reasonable.  Avoid street drugs and minimize use of alcohol.  Start taking a daily multivitamin after consulting your caregiver. SEEK MEDICAL CARE IF:   You have persistent tiredness, which cannot be accounted for.  You have fever.  You have unintentional weight loss.  You have headaches.  You have disturbed sleep throughout the night.  You are feeling sad.  You have constipation.  You have dry skin.  You have gained weight.  You are taking any new or different medicines that you suspect are causing fatigue.  You are unable to sleep at night.  You develop any unusual swelling of your legs or other parts of your body. SEEK IMMEDIATE MEDICAL CARE IF:   You are feeling confused.  Your vision is blurred.  You feel faint or pass out.  You develop severe headache.  You develop severe abdominal, pelvic, or  back pain.  You develop chest pain, shortness of breath, or an irregular or fast heartbeat.  You are unable to pass a normal amount of urine.  You develop abnormal bleeding such as bleeding from the rectum or you vomit blood.  You have thoughts  about harming yourself or committing suicide.  You are worried that you might harm someone else. MAKE SURE YOU:   Understand these instructions.  Will watch your condition.  Will get help right away if you are not doing well or get worse. Document Released: 09/24/2007 Document Revised: 02/19/2012 Document Reviewed: 09/24/2007 [facility] Patient Information 2015 [place], [place]. This information is not intended to replace advice given to you by your health care provider. Make sure you discuss any questions you have with your health care provider.   Musculoskeletal Pain Musculoskeletal pain is muscle and boney aches and pains. These pains can occur in any part of the body. Your caregiver may treat you without knowing the cause of the pain. They may treat you if blood or urine tests, X-rays, and other tests were normal.  CAUSES There is often not a definite cause or reason for these pains. These pains may be caused by a type of germ (virus). The discomfort may also come from overuse. Overuse includes working out too hard when your body is not fit. Boney aches also come from weather changes. Bone is sensitive to atmospheric pressure changes. HOME CARE INSTRUCTIONS   Ask when your test results will be ready. Make sure you get your test results.  Only take over-the-counter or prescription medicines for pain, discomfort, or fever as directed by your caregiver. If you were given medications for your condition, do not drive, operate machinery or power tools, or sign legal documents for 24 hours. Do not drink alcohol. Do not take sleeping pills or other medications that may interfere with treatment.  Continue all activities unless the activities cause more pain. When the pain lessens, slowly resume normal activities. Gradually increase the intensity and duration of the activities or exercise.  During periods of severe pain, bed rest may be helpful. Lay or sit in any position that is  comfortable.  Putting ice on the injured area.  Put ice in a bag.  Place a towel between your skin and the bag.  Leave the ice on for 15 to 20 minutes, 3 to 4 times a day.  Follow up with your caregiver for continued problems and no reason can be found for the pain. If the pain becomes worse or does not go away, it may be necessary to repeat tests or do additional testing. Your caregiver may need to look further for a possible cause. SEEK IMMEDIATE MEDICAL CARE IF:  You have pain that is getting worse and is not relieved by medications.  You develop chest pain that is associated with shortness or breath, sweating, feeling sick to your stomach (nauseous), or throw up (vomit).  Your pain becomes localized to the abdomen.  You develop any new symptoms that seem different or that concern you. MAKE SURE YOU:   Understand these instructions.  Will watch your condition.  Will get help right away if you are not doing well or get worse. Document Released: 11/27/2005 Document Revised: 02/19/2012 Document Reviewed: 08/01/2013 [facility] Patient Information 2015 [place], [place]. This information is not intended to replace advice given to you by your health care provider. Make sure you discuss any questions you have with your health care provider.  Sinusitis Sinusitis is redness, soreness,  and swelling (inflammation) of the paranasal sinuses. Paranasal sinuses are air pockets within the bones of your face (beneath the eyes, the middle of the forehead, or above the eyes). In healthy paranasal sinuses, mucus is able to drain out, and air is able to circulate through them by way of your nose. However, when your paranasal sinuses are inflamed, mucus and air can become trapped. This can allow bacteria and other germs to grow and cause infection. Sinusitis can develop quickly and last only a short time (acute) or continue over a long period (chronic). Sinusitis that lasts for more than 12 weeks is  considered chronic.  CAUSES  Causes of sinusitis include:  Allergies.  Structural abnormalities, such as displacement of the cartilage that separates your nostrils (deviated septum), which can decrease the air flow through your nose and sinuses and affect sinus drainage.  Functional abnormalities, such as when the small hairs (cilia) that line your sinuses and help remove mucus do not work properly or are not present. SYMPTOMS  Symptoms of acute and chronic sinusitis are the same. The primary symptoms are pain and pressure around the affected sinuses. Other symptoms include:  Upper toothache.  Earache.  Headache.  Bad breath.  Decreased sense of smell and taste.  A cough, which worsens when you are lying flat.  Fatigue.  Fever.  Thick drainage from your nose, which often is green and may contain pus (purulent).  Swelling and warmth over the affected sinuses. DIAGNOSIS  Your caregiver will perform a physical exam. During the exam, your caregiver may:  Look in your nose for signs of abnormal growths in your nostrils (nasal polyps).  Tap over the affected sinus to check for signs of infection.  View the inside of your sinuses (endoscopy) with a special imaging device with a light attached (endoscope), which is inserted into your sinuses. If your caregiver suspects that you have chronic sinusitis, one or more of the following tests may be recommended:  Allergy tests.  Nasal culture--A sample of mucus is taken from your nose and sent to a lab and screened for bacteria.  Nasal cytology--A sample of mucus is taken from your nose and examined by your caregiver to determine if your sinusitis is related to an allergy. TREATMENT  Most cases of acute sinusitis are related to a viral infection and will resolve on their own within 10 days. Sometimes medicines are prescribed to help relieve symptoms (pain medicine, decongestants, nasal steroid sprays, or saline sprays).  However, for  sinusitis related to a bacterial infection, your caregiver will prescribe antibiotic medicines. These are medicines that will help kill the bacteria causing the infection.  Rarely, sinusitis is caused by a fungal infection. In theses cases, your caregiver will prescribe antifungal medicine. For some cases of chronic sinusitis, surgery is needed. Generally, these are cases in which sinusitis recurs more than 3 times per year, despite other treatments. HOME CARE INSTRUCTIONS   Drink plenty of water. Water helps thin the mucus so your sinuses can drain more easily.  Use a humidifier.  Inhale steam 3 to 4 times a day (for example, sit in the bathroom with the shower running).  Apply a warm, moist washcloth to your face 3 to 4 times a day, or as directed by your caregiver.  Use saline nasal sprays to help moisten and clean your sinuses.  Take over-the-counter or prescription medicines for pain, discomfort, or fever only as directed by your caregiver. SEEK IMMEDIATE MEDICAL CARE IF:  You have increasing  pain or severe headaches.  You have nausea, vomiting, or drowsiness.  You have swelling around your face.  You have vision problems.  You have a stiff neck.  You have difficulty breathing. MAKE SURE YOU:   Understand these instructions.  Will watch your condition.  Will get help right away if you are not doing well or get worse. Document Released: 11/27/2005 Document Revised: 02/19/2012 Document Reviewed: 12/12/2011 [facility] Patient Information 2015 [place], [place]. This information is not intended to replace advice given to you by your health care provider. Make sure you discuss any questions you have with your health care provider.  Weakness Weakness is a lack of strength. It may be felt all over the body (generalized) or in one specific part of the body (focal). Some causes of weakness can be serious. You may need further medical evaluation, especially if you are elderly or you  have a history of immunosuppression (such as chemotherapy or HIV), kidney disease, heart disease, or diabetes. CAUSES  Weakness can be caused by many different things, including:  Infection.  Physical exhaustion.  Internal bleeding or other blood loss that results in a lack of red blood cells (anemia).  Dehydration. This cause is more common in elderly people.  Side effects or electrolyte abnormalities from medicines, such as pain medicines or sedatives.  Emotional distress, anxiety, or depression.  Circulation problems, especially severe peripheral arterial disease.  Heart disease, such as rapid atrial fibrillation, bradycardia, or heart failure.  Nervous system disorders, such as Guillain-Barr syndrome, multiple sclerosis, or stroke. DIAGNOSIS  To find the cause of your weakness, your caregiver will take your history and perform a physical exam. Lab tests or X-rays may also be ordered, if needed. TREATMENT  Treatment of weakness depends on the cause of your symptoms and can vary greatly. HOME CARE INSTRUCTIONS   Rest as needed.  Eat a well-balanced diet.  Try to get some exercise every day.  Only take over-the-counter or prescription medicines as directed by your caregiver. SEEK MEDICAL CARE IF:   Your weakness seems to be getting worse or spreads to other parts of your body.  You develop new aches or pains. SEEK IMMEDIATE MEDICAL CARE IF:   You cannot perform your normal daily activities, such as getting dressed and feeding yourself.  You cannot walk up and down stairs, or you feel exhausted when you do so.  You have shortness of breath or chest pain.  You have difficulty moving parts of your body.  You have weakness in only one area of the body or on only one side of the body.  You have a fever.  You have trouble speaking or swallowing.  You cannot control your bladder or bowel movements.  You have black or bloody vomit or stools. MAKE SURE  YOU:  Understand these instructions.  Will watch your condition.  Will get help right away if you are not doing well or get worse. Document Released: 11/27/2005 Document Revised: 05/28/2012 Document Reviewed: 01/26/2012 [facility] Patient Information 2015 [place], [place]. This information is not intended to replace advice given to you by your health care provider. Make sure you discuss any questions you have with your health care provider.

## 2014-06-04 NOTE — ED Notes (Signed)
Pt still reporting a headache.  EDP made aware.  No new orders placed. Will continue to monitor.

## 2014-06-04 NOTE — ED Notes (Signed)
Pt ambulating independently w/ steady gait on d/c in no acute distress, A&Ox4. D/c instructions reviewed w/ pt and family - pt and family deny any further questions or concerns at present. Rx given x3  

## 2014-06-04 NOTE — ED Provider Notes (Signed)
CSN: [id number]     Arrival date & time 06/03/14  2258 History   First MD Initiated Contact with Patient 06/03/14 2342     Chief Complaint  Patient presents with  . Weakness  . Nausea  . Emesis     (Consider location/radiation/quality/duration/timing/severity/associated sxs/prior Treatment) HPI 38 year old female presents emergency part from home via EMS with complaint of nausea and vomiting x2 today, generalized weakness and possible seizure.  Patient reports she normally feels weak just prior to having a seizure.  Patient is unclear if she had a seizure today.  Patient has been off of her seizure medications, Dilantin, Tegretol and Topamax for several months.  She reports that she is "hard headed" and does not like to take it.  Patient reports history of seizures, migraines, sickle cell trait, and pericarditis.  Patient recently treated with a boot to her foot secondary to pain.  She reports she has something wrong with her bone in her foot but she does not know the details.  Patient is having difficulties moving her arms and legs secondary to weakness.  She reports a headache and diffuse pain all over her body.  Patient reports blurred vision.  Patient is speaking in a stutter which per family is new. Past Medical History  Diagnosis Date  . Anemia   . Asthma   . Blood transfusion 2011    r/t anemia  . Migraine   . Sickle cell trait   . Seizures     last sz Jan 2012  . Seizures   . Fibroid   . Tooth abscess    Past Surgical History  Procedure Laterality Date  . Tubal ligation Bilateral 2005   Family History  Problem Relation Age of Onset  . Anesthesia problems Neg Hx   . Hypertension Mother   . Diabetes Mother   . Cancer Mother   . Diabetes Father   . Diabetes Maternal Grandmother    History  Substance Use Topics  . Smoking status: Former Research scientist (life sciences)  . Smokeless tobacco: Never Used  . Alcohol Use: No   OB History   Grav Para Term Preterm Abortions TAB SAB Ect Mult Living    3 2 1 1 1  0 0 1 0 2     Review of Systems  See History of Present Illness; otherwise all other systems are reviewed and negative   Allergies  Penicillins and Sulfa antibiotics  Home Medications   Prior to Admission medications   Medication Sig Start Date End Date Taking? Authorizing Provider  colchicine 0.6 MG tablet Take 1 tablet (0.6 mg total) by mouth 2 (two) times daily. 01/20/14  Yes Dorothy Spark, MD  diphenhydrAMINE (BENADRYL) 25 mg capsule Take 25 mg by mouth every 4 (four) hours as needed for allergies.   Yes Historical Provider, MD  ibuprofen (ADVIL,MOTRIN) 200 MG tablet Take 600 mg by mouth every 6 (six) hours as needed for moderate pain.    Yes Historical Provider, MD  indomethacin (INDOCIN) 50 MG capsule Take 1 capsule (50 mg total) by mouth 3 (three) times daily with meals. 01/20/14  Yes Dorothy Spark, MD  medroxyPROGESTERone (DEPO-PROVERA) 150 MG/ML injection Inject 1 mL (150 mg total) into the muscle every 3 (three) months. 02/11/14  Yes Shelly Bombard, MD  meloxicam (MOBIC) 15 MG tablet Take 1 tablet (15 mg total) by mouth daily. 02/08/14  Yes Peter S Dammen, PA-C  omeprazole (PRILOSEC) 20 MG capsule Take 1 capsule (20 mg total) by mouth 2 (two)  times daily before a meal. 01/28/14  Yes Shelly Bombard, MD  albuterol (PROVENTIL HFA;VENTOLIN HFA) 108 (90 BASE) MCG/ACT inhaler Inhale 2 puffs into the lungs every 6 (six) hours as needed for shortness of breath. Shortness of breath    Historical Provider, MD   BP 128/81  Pulse 95  Temp(Src) 100.1 F (37.8 C) (Rectal)  Resp 20  SpO2 97% Physical Exam  Nursing note and vitals reviewed. Constitutional: She is oriented to person, place, and time. She appears well-developed and well-nourished.  Obese female, uncomfortable appearing  HENT:  Head: Normocephalic and atraumatic.  Right Ear: External ear normal.  Left Ear: External ear normal.  Nose: Nose normal.  Mouth/Throat: Oropharynx is clear and moist.  Eyes:  Conjunctivae are normal. Pupils are equal, round, and reactive to light.  Patient has disconjugate gaze of the right eye when looking left and right.  Patient has difficulties looking upwards.  Neck: Normal range of motion. Neck supple. No JVD present. No tracheal deviation present. No thyromegaly present.  Cardiovascular: Normal rate, regular rhythm, normal heart sounds and intact distal pulses.  Exam reveals no gallop and no friction rub.   No murmur heard. Pulmonary/Chest: Effort normal and breath sounds normal. No stridor. No respiratory distress. She has no wheezes. She has no rales. She exhibits no tenderness.  Abdominal: Soft. Bowel sounds are normal. She exhibits no distension and no mass. There is no tenderness. There is no rebound and no guarding.  Musculoskeletal: Normal range of motion. She exhibits no edema and no tenderness.  Lymphadenopathy:    She has no cervical adenopathy.  Neurological: She is alert and oriented to person, place, and time. She displays abnormal reflex. No cranial nerve deficit. She exhibits abnormal muscle tone. Coordination abnormal.  Patient has 2/5 strength with grip or movement leg lift resistance to gravity and to resistance.  I was able to elicit a week patellar reflex on the left, absent on the right  Skin: Skin is warm and dry. No rash noted. No erythema. No pallor.  Psychiatric: She has a normal mood and affect. Her behavior is normal. Judgment and thought content normal.    ED Course  Procedures (including critical care time) Labs Review Labs Reviewed  CBC WITH DIFFERENTIAL - Abnormal; Notable for the following:    Hemoglobin 11.9 (*)    HCT 35.2 (*)    Basophils Relative 2 (*)    Basophils Absolute 0.2 (*)    All other components within normal limits  COMPREHENSIVE METABOLIC PANEL - Abnormal; Notable for the following:    Potassium 3.5 (*)    BUN 4 (*)    All other components within normal limits  URINALYSIS, ROUTINE W REFLEX MICROSCOPIC -  Abnormal; Notable for the following:    APPearance CLOUDY (*)    Hgb urine dipstick SMALL (*)    Leukocytes, UA TRACE (*)    All other components within normal limits  URINE MICROSCOPIC-ADD ON - Abnormal; Notable for the following:    Squamous Epithelial / LPF MANY (*)    All other components within normal limits  LIPASE, BLOOD  TROPONIN I  SEDIMENTATION RATE  POC URINE PREG, ED  I-STAT TROPOININ, ED    Imaging Review Ct Head Wo Contrast  06/04/2014   CLINICAL DATA:  Weakness.  Nausea and vomiting.  EXAM: CT HEAD WITHOUT CONTRAST  TECHNIQUE: Contiguous axial images were obtained from the base of the skull through the vertex without intravenous contrast.  COMPARISON:  02/16/2013  FINDINGS:  The brainstem, cerebellum, cerebral peduncles, thalamus, basal ganglia, basilar cisterns, and ventricular system appear within normal limits. No intracranial hemorrhage, mass lesion, or acute CVA.  Prominent mucosal thickening in the nasal cavity with worsened chronic ethmoid sinusitis. Chronic frontal sinusitis is present and there is frothy fluid in the right sphenoid sinus compatible with acute sphenoid sinusitis.  IMPRESSION: 1. Worsened sinusitis, with acute sphenoid sinusitis, more prominent ethmoid sinusitis, and prominent nasal mucosal swelling. 2. Intracranial structures appear unremarkable.   Electronically Signed   By: Sherryl Barters M.D.   On: 06/04/2014 01:29     EKG Interpretation   Date/Time:  Wednesday June 03 2014 23:15:32 EDT Ventricular Rate:  96 PR Interval:  157 QRS Duration: 87 QT Interval:  350 QTC Calculation: 442 R Axis:   41 Text Interpretation:  Sinus rhythm No significant change since last  tracing Confirmed by OTTER  MD, OLGA ([id number]) on 06/03/2014 11:48:39 PM      MDM   Final diagnoses:  Weakness  Seizure disorder  Myalgia  Other subacute sinusitis  Headache, unspecified headache type    38 year old female with generalized weakness, disconjugate gaze with  eye movement, new stutter  Patient has history of seizures, sickle cell trait.  She has a low-grade temp here.  No neck stiffness.  Plan for labs, CT head.  Expect patient will need neuro consult and further evaluation.  2:34 AM Sinusitis seen on CT scan.  Workup otherwise unremarkable.  No change with ativan, still c/o diffuse msk pain.  Pt able to open eyes better, no disconjugate gaze noted, but still with stutter.  Will consult neuro.  Dr. Leonel Ramsay with neurology has seen the patient.  He does not feel patient needs further workup.  He is recommending Keppra.  Will treat for sinusitis noted on CT scan.  Patient received headache cocktail.  She reports she is feeling better.  Strength is better, stuttering still present, but patient is sure that this will improve with time.  Outpatient referrals to neurology made.  Kalman Drape, MD 06/04/14 [phone]

## 2014-06-09 ENCOUNTER — Telehealth: Payer: Self-pay | Admitting: *Deleted

## 2014-06-09 NOTE — Telephone Encounter (Signed)
Pharmacy sent a prior auth for for depo for pt.  In review of chart, depo was written on 02/11/14.  It is not noted in chart that pt was seen for a return visit to start depo.  Placed call to pt to verify need for depo and if she has started on it elsewhere.  LM on VM to CB.

## 2014-08-02 ENCOUNTER — Emergency Department (HOSPITAL_COMMUNITY): Payer: No Typology Code available for payment source

## 2014-08-02 ENCOUNTER — Encounter (HOSPITAL_COMMUNITY): Payer: Self-pay | Admitting: Emergency Medicine

## 2014-08-02 ENCOUNTER — Emergency Department (HOSPITAL_COMMUNITY)
Admission: EM | Admit: 2014-08-02 | Discharge: 2014-08-02 | Disposition: A | Discharge status: Home / Self Care | Payer: No Typology Code available for payment source | Attending: Emergency Medicine | Admitting: Emergency Medicine

## 2014-08-02 DIAGNOSIS — Z8679 Personal history of other diseases of the circulatory system: Secondary | ICD-10-CM | POA: Diagnosis not present

## 2014-08-02 DIAGNOSIS — Z862 Personal history of diseases of the blood and blood-forming organs and certain disorders involving the immune mechanism: Secondary | ICD-10-CM | POA: Diagnosis not present

## 2014-08-02 DIAGNOSIS — Z791 Long term (current) use of non-steroidal anti-inflammatories (NSAID): Secondary | ICD-10-CM | POA: Diagnosis not present

## 2014-08-02 DIAGNOSIS — Z8742 Personal history of other diseases of the female genital tract: Secondary | ICD-10-CM | POA: Insufficient documentation

## 2014-08-02 DIAGNOSIS — IMO0002 Reserved for concepts with insufficient information to code with codable children: Secondary | ICD-10-CM | POA: Insufficient documentation

## 2014-08-02 DIAGNOSIS — J45909 Unspecified asthma, uncomplicated: Secondary | ICD-10-CM | POA: Insufficient documentation

## 2014-08-02 DIAGNOSIS — G40909 Epilepsy, unspecified, not intractable, without status epilepticus: Secondary | ICD-10-CM | POA: Diagnosis not present

## 2014-08-02 DIAGNOSIS — Z8719 Personal history of other diseases of the digestive system: Secondary | ICD-10-CM | POA: Diagnosis not present

## 2014-08-02 DIAGNOSIS — R079 Chest pain, unspecified: Secondary | ICD-10-CM | POA: Insufficient documentation

## 2014-08-02 DIAGNOSIS — Z9851 Tubal ligation status: Secondary | ICD-10-CM | POA: Insufficient documentation

## 2014-08-02 DIAGNOSIS — Z87891 Personal history of nicotine dependence: Secondary | ICD-10-CM | POA: Insufficient documentation

## 2014-08-02 DIAGNOSIS — Z880 Allergy status to penicillin: Secondary | ICD-10-CM | POA: Insufficient documentation

## 2014-08-02 DIAGNOSIS — Z79899 Other long term (current) drug therapy: Secondary | ICD-10-CM | POA: Insufficient documentation

## 2014-08-02 LAB — CBC WITH DIFFERENTIAL/PLATELET
Basophils Absolute: 0 10*3/uL (ref 0.0–0.1)
Basophils Relative: 1 % (ref 0–1)
EOS PCT: 5 % (ref 0–5)
Eosinophils Absolute: 0.4 10*3/uL (ref 0.0–0.7)
HEMATOCRIT: 35.4 % — AB (ref 36.0–46.0)
Hemoglobin: 12 g/dL (ref 12.0–15.0)
LYMPHS ABS: 2.8 10*3/uL (ref 0.7–4.0)
Lymphocytes Relative: 36 % (ref 12–46)
MCH: 28.9 pg (ref 26.0–34.0)
MCHC: 33.9 g/dL (ref 30.0–36.0)
MCV: 85.3 fL (ref 78.0–100.0)
MONO ABS: 0.5 10*3/uL (ref 0.1–1.0)
MONOS PCT: 7 % (ref 3–12)
Neutro Abs: 4.2 10*3/uL (ref 1.7–7.7)
Neutrophils Relative %: 51 % (ref 43–77)
PLATELETS: 227 10*3/uL (ref 150–400)
RBC: 4.15 MIL/uL (ref 3.87–5.11)
RDW: 12.6 % (ref 11.5–15.5)
WBC: 7.9 10*3/uL (ref 4.0–10.5)

## 2014-08-02 LAB — BASIC METABOLIC PANEL WITH GFR
Anion gap: 15 (ref 5–15)
BUN: 12 mg/dL (ref 6–23)
CO2: 20 meq/L (ref 19–32)
Calcium: 9.4 mg/dL (ref 8.4–10.5)
Chloride: 106 meq/L (ref 96–112)
Creatinine, Ser: 0.72 mg/dL (ref 0.50–1.10)
GFR calc Af Amer: >90 mL/min
GFR calc non Af Amer: >90 mL/min
Glucose, Bld: 99 mg/dL (ref 70–99)
Potassium: 3.7 meq/L (ref 3.7–5.3)
Sodium: 141 meq/L (ref 137–147)

## 2014-08-02 LAB — TROPONIN I
Troponin I: <0.3 ng/mL (ref <0.30)
Troponin I: <0.3 ng/mL

## 2014-08-02 MED ORDER — KETOROLAC TROMETHAMINE 30 MG/ML IJ SOLN
30.0000 mg | Freq: Once | INTRAMUSCULAR | Status: AC
  Administered 2014-08-02: 30 mg via INTRAVENOUS
  Filled 2014-08-02: qty 1

## 2014-08-02 MED ORDER — MORPHINE SULFATE 4 MG/ML IJ SOLN
4.0000 mg | Freq: Once | INTRAMUSCULAR | Status: AC
  Administered 2014-08-02: 4 mg via INTRAVENOUS
  Filled 2014-08-02: qty 1

## 2014-08-02 MED ORDER — INDOMETHACIN 25 MG PO CAPS: 25.0000 mg | ORAL_CAPSULE | Freq: Three times a day (TID) | ORAL | Status: DC

## 2014-08-02 MED ORDER — MORPHINE SULFATE 4 MG/ML IJ SOLN
INTRAMUSCULAR | Status: AC
  Filled 2014-08-02: qty 1

## 2014-08-02 MED ORDER — OXYCODONE-ACETAMINOPHEN 5-325 MG PO TABS: 2.0000 | ORAL_TABLET | ORAL | Status: DC | PRN

## 2014-08-02 NOTE — Discharge Instructions (Signed)
Take Indomethacin for chest pain as directed. Take Percocet for extra pain relief as needed. Follow up with your doctor for further evaluation. Return to the ED with worsening or concerning symptoms.

## 2014-08-02 NOTE — ED Provider Notes (Signed)
CSN: [id number]     Arrival date & time 08/02/14  0136 History   First MD Initiated Contact with Patient 08/02/14 0153     Chief Complaint  Patient presents with  . Chest Pain  . Shortness of Breath     (Consider location/radiation/quality/duration/timing/severity/associated sxs/prior Treatment) HPI Comments: Patient is a 38 year old female with a past medical history of seizures who presents with chest pain that started yesterday. Symptoms started gradually and remained constant since the onset. The pain is sharp and located in her left chest without radiation. The pain is pleuritic and worse when she lies flat. Patient took hydrocodone which provided some relief then the pain returned. No associated symptoms. Patient reports this feels like when she had pericarditis a few months ago. Patient reports a recent URI. No other associated symptoms.    Past Medical History  Diagnosis Date  . Anemia   . Asthma   . Blood transfusion 2011    r/t anemia  . Migraine   . Sickle cell trait   . Seizures     last sz Jan 2012  . Seizures   . Fibroid   . Tooth abscess    Past Surgical History  Procedure Laterality Date  . Tubal ligation Bilateral 2005   Family History  Problem Relation Age of Onset  . Anesthesia problems Neg Hx   . Hypertension Mother   . Diabetes Mother   . Cancer Mother   . Diabetes Father   . Diabetes Maternal Grandmother    History  Substance Use Topics  . Smoking status: Former Research scientist (life sciences)  . Smokeless tobacco: Never Used  . Alcohol Use: No   OB History   Grav Para Term Preterm Abortions TAB SAB Ect Mult Living   3 2 1 1 1  0 0 1 0 2     Review of Systems  Constitutional: Negative for fever, chills and fatigue.  HENT: Negative for trouble swallowing.   Eyes: Negative for visual disturbance.  Respiratory: Negative for shortness of breath.   Cardiovascular: Positive for chest pain. Negative for palpitations.  Gastrointestinal: Negative for nausea, vomiting,  abdominal pain and diarrhea.  Genitourinary: Negative for dysuria and difficulty urinating.  Musculoskeletal: Negative for arthralgias and neck pain.  Skin: Negative for color change.  Neurological: Negative for dizziness and weakness.  Psychiatric/Behavioral: Negative for dysphoric mood.      Allergies  Penicillins and Sulfa antibiotics  Home Medications   Prior to Admission medications   Medication Sig Start Date End Date Taking? Authorizing Provider  albuterol (PROVENTIL HFA;VENTOLIN HFA) 108 (90 BASE) MCG/ACT inhaler Inhale 2 puffs into the lungs every 6 (six) hours as needed for shortness of breath. Shortness of breath    Historical Provider, MD  azithromycin (ZITHROMAX Z-PAK) 250 MG tablet Take two tabs today then one tab for the next 4 days 06/04/14   Kalman Drape, MD  colchicine 0.6 MG tablet Take 1 tablet (0.6 mg total) by mouth 2 (two) times daily. 01/20/14   Dorothy Spark, MD  diphenhydrAMINE (BENADRYL) 25 mg capsule Take 25 mg by mouth every 4 (four) hours as needed for allergies.    Historical Provider, MD  ibuprofen (ADVIL,MOTRIN) 200 MG tablet Take 600 mg by mouth every 6 (six) hours as needed for moderate pain.     Historical Provider, MD  indomethacin (INDOCIN) 50 MG capsule Take 1 capsule (50 mg total) by mouth 3 (three) times daily with meals. 01/20/14   Dorothy Spark, MD  levETIRAcetam (KEPPRA) 500 MG tablet Take 1 tablet (500 mg total) by mouth 2 (two) times daily. 06/04/14   Kalman Drape, MD  medroxyPROGESTERone (DEPO-PROVERA) 150 MG/ML injection Inject 1 mL (150 mg total) into the muscle every 3 (three) months. 02/11/14   Shelly Bombard, MD  meloxicam (MOBIC) 15 MG tablet Take 1 tablet (15 mg total) by mouth daily. 02/08/14   Ruthell Rummage Dammen, PA-C  mometasone (NASONEX) 50 MCG/ACT nasal spray Place 2 sprays into the nose 2 (two) times daily. 06/04/14   Kalman Drape, MD  omeprazole (PRILOSEC) 20 MG capsule Take 1 capsule (20 mg total) by mouth 2 (two) times daily  before a meal. 01/28/14   Shelly Bombard, MD   BP 141/89  Pulse 68  Temp(Src) 98.2 F (36.8 C) (Oral)  Resp 9  Ht 5\' 5"  (1.651 m)  Wt 240 lb (108.863 kg)  BMI 39.94 kg/m2  SpO2 100% Physical Exam  Nursing note and vitals reviewed. Constitutional: She is oriented to person, place, and time. She appears well-developed and well-nourished. No distress.  HENT:  Head: Normocephalic and atraumatic.  Eyes: Conjunctivae and EOM are normal.  Neck: Normal range of motion.  Cardiovascular: Normal rate and regular rhythm.  Exam reveals no gallop and no friction rub.   No murmur heard. Pulmonary/Chest: Effort normal and breath sounds normal. She has no wheezes. She has no rales. She exhibits no tenderness.  No friction rub noted.   Abdominal: Soft. She exhibits no distension. There is no tenderness. There is no rebound.  Musculoskeletal: Normal range of motion.  Neurological: She is alert and oriented to person, place, and time.  Speech is goal-oriented. Moves limbs without ataxia.   Skin: Skin is warm and dry.  Psychiatric: She has a normal mood and affect. Her behavior is normal.    ED Course  Procedures (including critical care time) Labs Review Labs Reviewed  CBC WITH DIFFERENTIAL - Abnormal; Notable for the following:    HCT 35.4 (*)    All other components within normal limits  BASIC METABOLIC PANEL  TROPONIN I  TROPONIN I  POC URINE PREG, ED    Imaging Review Dg Chest 2 View  08/02/2014   CLINICAL DATA:  Chest pain  EXAM: CHEST  2 VIEW  COMPARISON:  01/05/2014  FINDINGS: Normal heart size and mediastinal contours. No acute infiltrate or edema. No effusion or pneumothorax. No acute osseous findings.  IMPRESSION: No active cardiopulmonary disease.   Electronically Signed   By: Jorje Guild M.D.   On: 08/02/2014 03:22     EKG Interpretation None      MDM   Final diagnoses:  Chest pain, unspecified chest pain type    2:06 AM Labs and chest xray pending. Vitals  stable and patient afebrile.   6:09 AM Patient's chest xray and labs unremarkable for acute changes. Patient has 2 negative troponin and no EKG changes. Patient reports this pain feels like when she had pericarditis previously. Her pain is positional and she reports a recent viral illness. Patient is PERC negative. Vitals stable and patient afebrile. Patient will be treated with anti-inflammatory medications. Patient will have a recommended follow up with PCP. Patient instructed to return with worsening or concerning symptoms.   Alvina Chou, [place] 08/02/14 [phone]

## 2014-08-02 NOTE — ED Notes (Signed)
Writer notified RN of unable to draw blood work.

## 2014-08-02 NOTE — ED Provider Notes (Signed)
Medical screening examination/treatment/procedure(s) were performed by non-physician practitioner and as supervising physician I was immediately available for consultation/collaboration.   EKG Interpretation   Date/Time:  Sunday August 02 2014 01:44:46 EDT Ventricular Rate:  71 PR Interval:  165 QRS Duration: 90 QT Interval:  395 QTC Calculation: 429 R Axis:   52 Text Interpretation:  Sinus rhythm ST elev, probable normal early repol  pattern No significant change was found Confirmed by Larsen Zettel  MD, Raeford Brandenburg  ([id number]) on 08/02/2014 8:39:57 AM        Hoy Morn, MD 08/02/14 [phone]

## 2014-08-02 NOTE — ED Notes (Signed)
Came in with complaint of chest pain at 8/10 which started at 10 pm last night, took hydrocodone got relief for an hour , unable to sleep as pain got worsen  with SOB. Pt. Claimed of having the same problem last march of this year.

## 2014-08-13 ENCOUNTER — Ambulatory Visit (INDEPENDENT_AMBULATORY_CARE_PROVIDER_SITE_OTHER): Payer: No Typology Code available for payment source | Admitting: Family Medicine

## 2014-08-13 ENCOUNTER — Encounter: Payer: Self-pay | Admitting: Family Medicine

## 2014-08-13 VITALS — BP 125/88 | HR 72 | Temp 98.2°F | Wt 243.0 lb

## 2014-08-13 DIAGNOSIS — I309 Acute pericarditis, unspecified: Secondary | ICD-10-CM

## 2014-08-13 DIAGNOSIS — R569 Unspecified convulsions: Secondary | ICD-10-CM

## 2014-08-13 DIAGNOSIS — K219 Gastro-esophageal reflux disease without esophagitis: Secondary | ICD-10-CM

## 2014-08-13 DIAGNOSIS — Z23 Encounter for immunization: Secondary | ICD-10-CM

## 2014-08-13 DIAGNOSIS — G43909 Migraine, unspecified, not intractable, without status migrainosus: Secondary | ICD-10-CM | POA: Insufficient documentation

## 2014-08-13 MED ORDER — FEXOFENADINE HCL 180 MG PO TABS: 180.0000 mg | ORAL_TABLET | Freq: Every day | ORAL | Status: DC

## 2014-08-13 NOTE — Progress Notes (Signed)
Patient ID: Nicole [name], female   DOB: Mar 16, 1976, 38 y.o.   MRN: [id number]   Subjective:    Patient ID: Nicole [name], female    DOB: [birth year], 38 y.o.   MRN: [id number]  HPI  CC: new patient  # Seizure:  Started in her 30s. Was on multiple medications prior to ED visit this year when she was put on just depakote.  Was followed by Dr. Doy Mince, last seen in 2006. Seen by Dr. Leonel Ramsay in ED in June.  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 Seen/followed by Dr. Ena Dawley  # GERD  On omeprazole, not currently well controlled  Concurrently taking: indomethacin, meloxicam, ibuprofen  # Heavy menstrual bleeding  On depo shot, followed by Dr. Jodi Mourning  Social history Children: 51yo daughter, 10yo son. Adopted 1 child 57yo.  Exercise: walks daily with son while he is at sports practice, will go to the gym Tobacco: quit 05/11/2014. Smoked 0.5-1ppd for 1 year. Alcohol: social drinker  Review of Systems   Chest pains, swelling of feet, trouble breathing, nausea, vomiting, abdominal pain, muscle cramps or aches, joint pain or swelling, headaches, weakness, dizziness, stress, excessive thirst. All other systems reviewed and are negative.  Objective:  BP 125/88  Pulse 72  Temp(Src) 98.2 F (36.8 C) (Oral)  Wt 243 lb (110.224 kg) Vitals reviewed  General: NAD Eyes: PERRL, EOMI Mouth: MMM Neck: supple CV: RRR, normal s1 and s2, no murmur appreciated, 2+ radial and PT pulses bilaterally Resp: CTAB, normal effort Abdomen: soft, nontender, nondistended, normal bowel sounds Ext: no edema or cyanosis,  WWP. Neuro: alert and oriented, CN2-12 normal, grip strength 5/5 bilaterally  Assessment & Plan:  See Problem List Documentation

## 2014-08-13 NOTE — Assessment & Plan Note (Signed)
Not controlled. Taking 3 NSAIDs currently. Plan: discussed taking only 1 NSAID daily, currently on indomethacin. Continue omeprazole. May need evaluation for h. Pylori at f/u.

## 2014-08-13 NOTE — Assessment & Plan Note (Signed)
May benefit from prophylaxis but unable to elicit further history regarding this today.  Plan: need to address at next visit, neurologist may be able to assist.

## 2014-08-13 NOTE — Assessment & Plan Note (Signed)
Stable on keppra. Plan: referral to neurology to re-establish care

## 2014-08-13 NOTE — Assessment & Plan Note (Signed)
Currently on indomethacin and colchicine prescribed by ED. She reports that she was supposed to get a stress test a few months ago but they weren't able to establish IV Plan: asked her to follow up with cardiologist.

## 2014-08-13 NOTE — Patient Instructions (Signed)
For your bowel movements/constipation: Colace, take 100mg  every day.

## 2014-08-20 ENCOUNTER — Telehealth: Payer: Self-pay | Admitting: Family Medicine

## 2014-08-20 ENCOUNTER — Telehealth: Payer: Self-pay | Admitting: *Deleted

## 2014-08-20 NOTE — Telephone Encounter (Signed)
Call placed to pt regarding Depo Rx.  Pharmacy has faxed a prior approval form to office for this Rx.  Pt states that she has not had any problems getting her Depo from pharmacy.  Pt questioned as to whether or not she was getting injections, pt states that she gives injection to herself.  Pt states that last injection was last month.   Pt advised to contact office with any other concerns.

## 2014-08-20 NOTE — Telephone Encounter (Signed)
Cant breathe due to congestion. Wants to know if she can take benedryl with allergra

## 2014-08-20 NOTE — Telephone Encounter (Signed)
Pt is aware of this.  She has been having trouble sleeping due to congestion.  Advised her that if this doesn't improve or gets worse to make a same day appt to be evaluated. Dallas Scorsone,CMA

## 2014-08-20 NOTE — Telephone Encounter (Signed)
Patient can take benadryl and the allegra at the same time if she is taking the normal doses, but can you let her know that they are both antihistamines and she shouldn't normally need to take both at the same time (major difference is that benadryl can cause sedation). -Dr. Lamar Benes

## 2014-09-03 ENCOUNTER — Telehealth: Payer: Self-pay | Admitting: *Deleted

## 2014-09-03 IMAGING — CR DG CHEST 2V
2 series · 2 of 2 positions shown · non-contrast
Comparison: DG CHEST 2 VIEW dated 04/24/2013

CLINICAL DATA: SHORTNESS OF BREATH

EXAM:
CHEST  2 VIEW

[w chest pa]
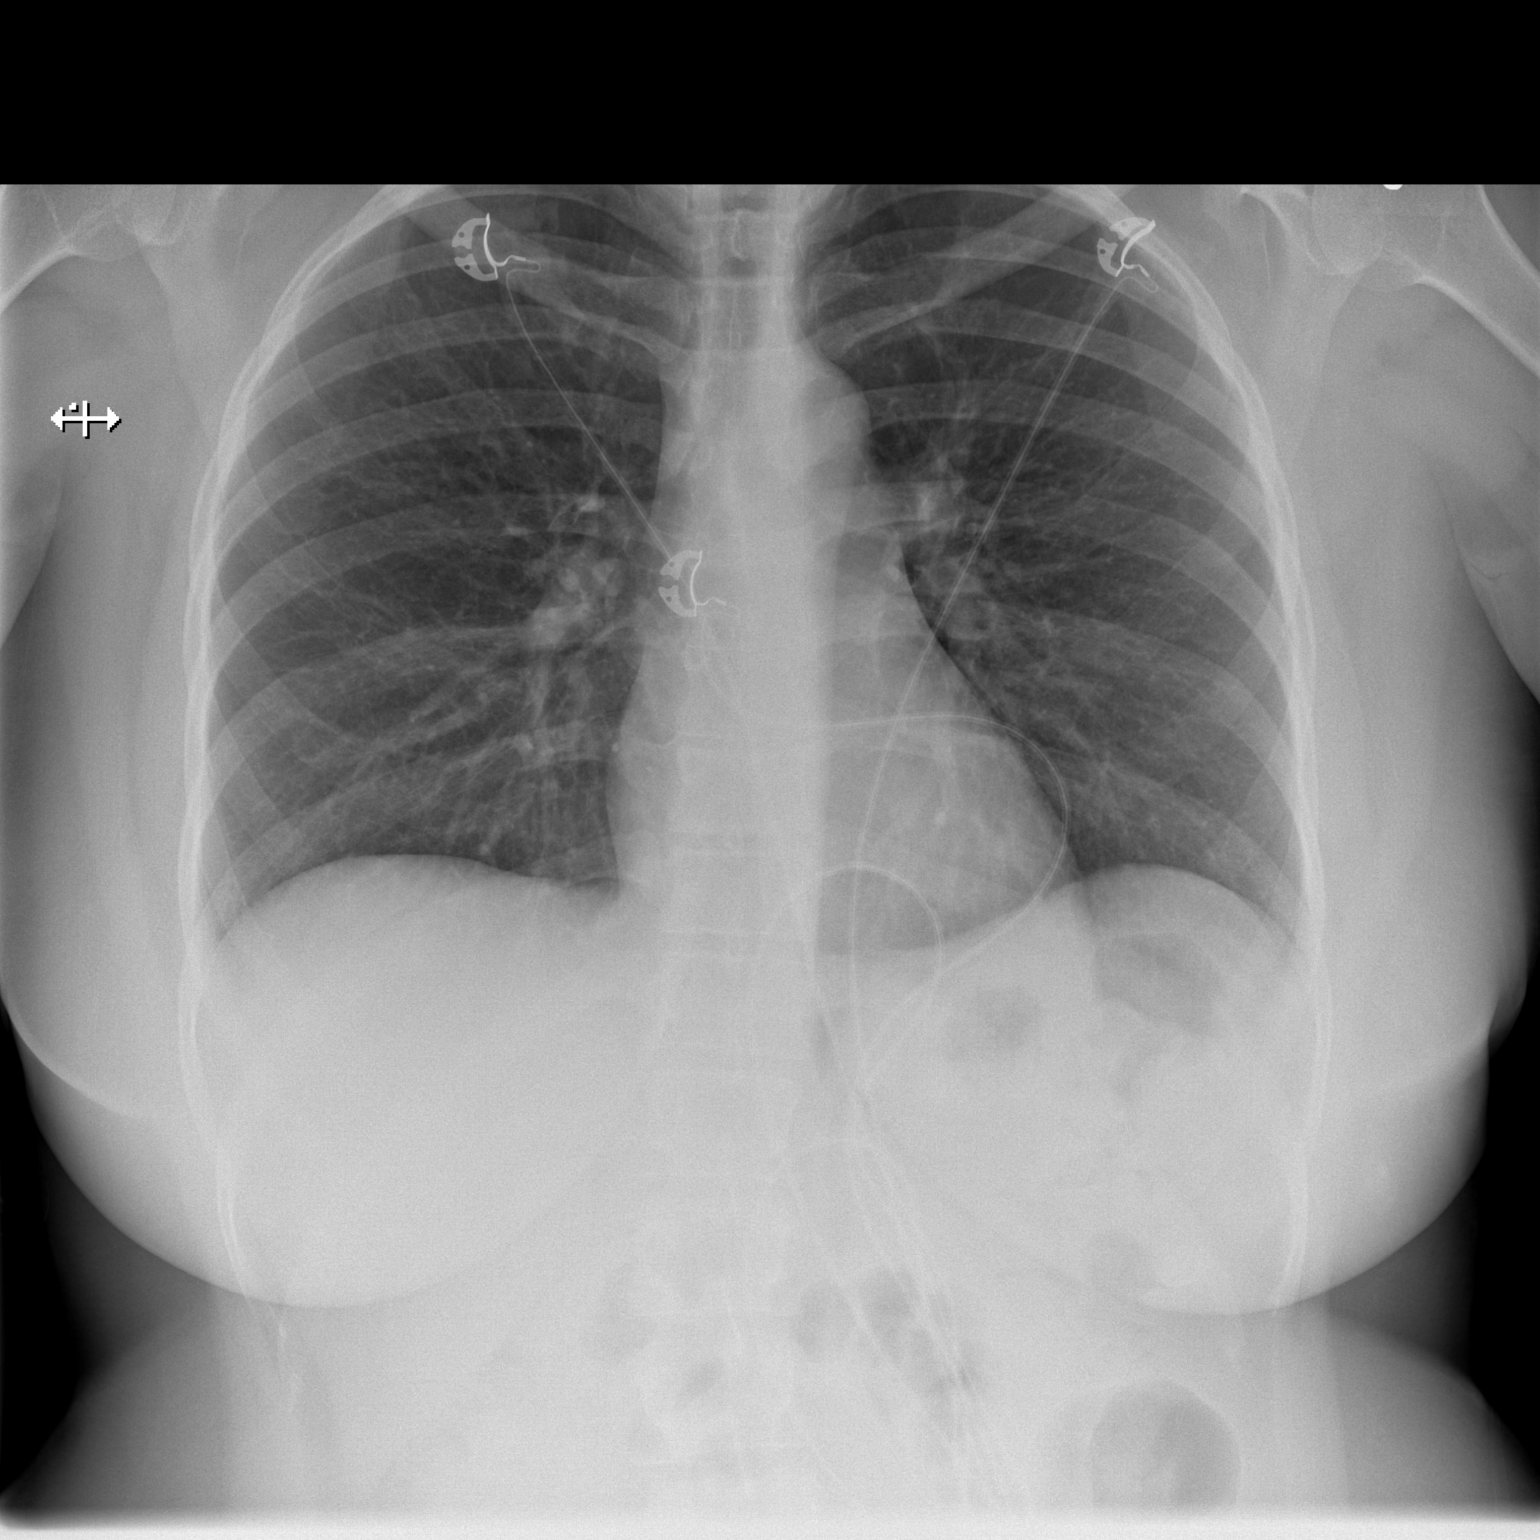

[w chest lat]
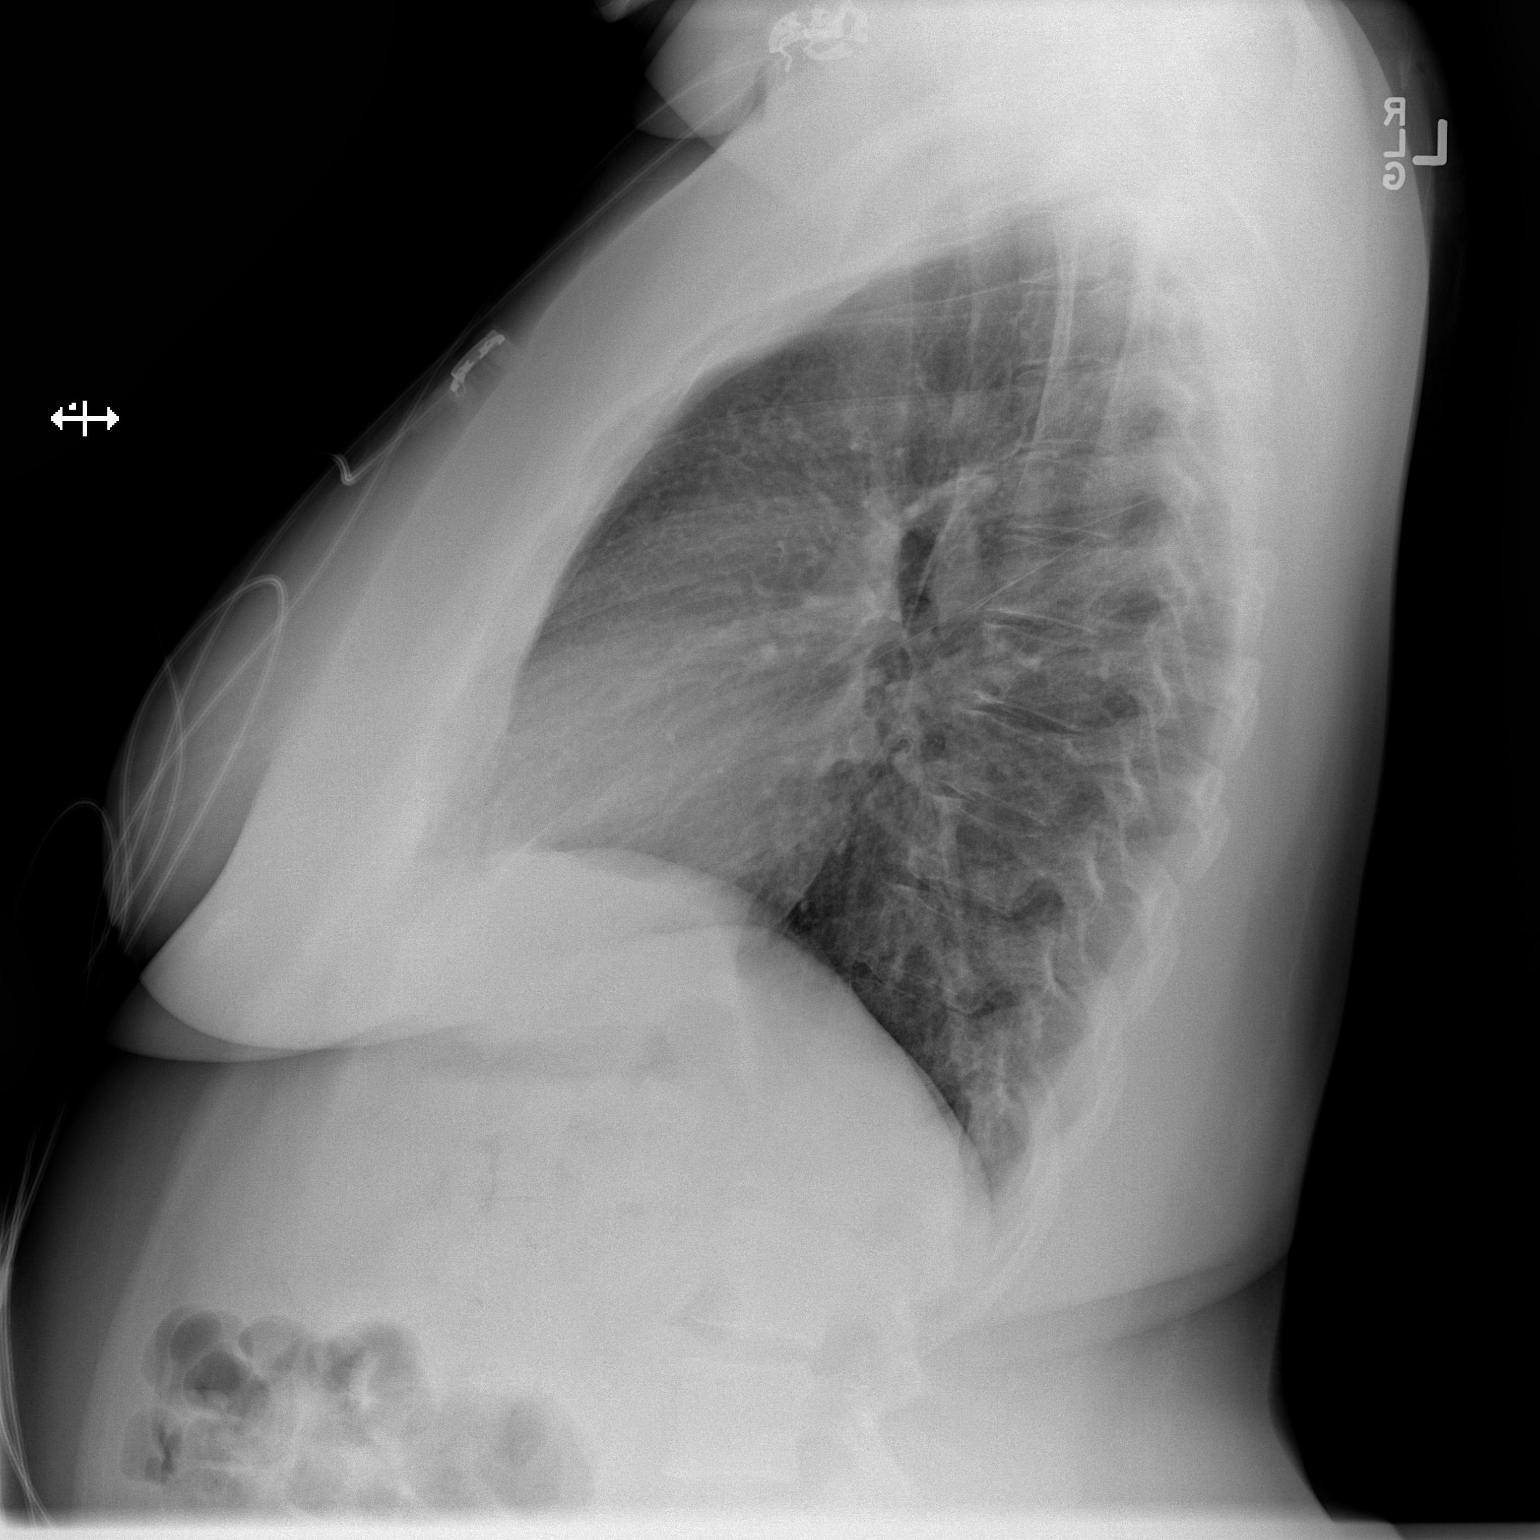

[2 of 2 positions shown; findings below may reference images not displayed]

FINDINGS: Normal mediastinum and cardiac silhouette. Normal pulmonary
vasculature. No evidence of effusion, infiltrate, or pneumothorax.
No acute bony abnormality.
IMPRESSION: Normal chest radiograph

## 2014-09-03 NOTE — Telephone Encounter (Signed)
Pt called stating her right foot is swollen and very painful.  Pt asked if she hit her foot on anything; she stated no.  Pt offered an appt today, but declined stating she could not make today but prefer tomorrow.  Appt 09/04/2014 at 10:45 AM.  Pt advised if pain/swelling worsening to go to urgent care or ED.  Pt should take Ibuprofen for pain relief and keep foot elevated.  Pt stated understanding.  Derl Barrow, RN

## 2014-09-04 ENCOUNTER — Ambulatory Visit (HOSPITAL_COMMUNITY)
Admission: RE | Admit: 2014-09-04 | Discharge: 2014-09-04 | Disposition: A | Discharge status: Home / Self Care | Payer: No Typology Code available for payment source | Source: Ambulatory Visit | Attending: Family Medicine | Admitting: Family Medicine

## 2014-09-04 ENCOUNTER — Ambulatory Visit (INDEPENDENT_AMBULATORY_CARE_PROVIDER_SITE_OTHER): Payer: No Typology Code available for payment source | Admitting: Family Medicine

## 2014-09-04 ENCOUNTER — Other Ambulatory Visit (HOSPITAL_COMMUNITY): Payer: Self-pay | Admitting: Family Medicine

## 2014-09-04 ENCOUNTER — Encounter: Payer: Self-pay | Admitting: Family Medicine

## 2014-09-04 VITALS — BP 108/68 | HR 68 | Temp 98.3°F | Wt 238.0 lb

## 2014-09-04 DIAGNOSIS — M25579 Pain in unspecified ankle and joints of unspecified foot: Secondary | ICD-10-CM | POA: Insufficient documentation

## 2014-09-04 DIAGNOSIS — M79604 Pain in right leg: Secondary | ICD-10-CM

## 2014-09-04 DIAGNOSIS — M7989 Other specified soft tissue disorders: Secondary | ICD-10-CM

## 2014-09-04 DIAGNOSIS — M79606 Pain in leg, unspecified: Secondary | ICD-10-CM | POA: Insufficient documentation

## 2014-09-04 DIAGNOSIS — M79609 Pain in unspecified limb: Secondary | ICD-10-CM

## 2014-09-04 MED ORDER — OXYCODONE-ACETAMINOPHEN 5-325 MG PO TABS: 1.0000 | ORAL_TABLET | Freq: Three times a day (TID) | ORAL | Status: DC | PRN

## 2014-09-04 NOTE — Patient Instructions (Signed)
I will call you when I get the results of your xray. Please do not put any weight on that leg until you hear from me.

## 2014-09-04 NOTE — Progress Notes (Signed)
Right lower extremity venous duplex completed.  Right:  No evidence of DVT, superficial thrombosis, or Baker's cyst.  Left:  Negative for DVT in the common femoral vein.  

## 2014-09-05 ENCOUNTER — Telehealth: Payer: Self-pay | Admitting: Family Medicine

## 2014-09-05 NOTE — Telephone Encounter (Signed)
[facility] after hours line  Returned phone call to patient who was seen yesterday in clinic for right leg pain, had x-rays and doppler ultrasound done which were negative (results discussed); given norco prescription. Patient continues to have pain in her right leg which she describes as "throbbing". Pain improves with elevation and taking norco, but patient tried to take tylenol only without much relief. Patient also states she is drinking 8oz of fluid every 4 hours and that she isn't peeing as much, when she does the urine is darker, but denies any dysuria. Denies CP, SOB, fevers or chills. I informed patient that I would be unable to diagnose her over the phone, and that if the pain is severe and unrelieved by the norco she should probably be evaluated by urgent care or the ED. She can continue elevating the leg and staying off of it while it hurts, and if this works she could call Monday to schedule a same day appointment. Patient verbalized understanding.  Tawanna Sat, MD 09/05/2014, 12:29 PM PGY-2, [place]

## 2014-09-10 NOTE — Assessment & Plan Note (Addendum)
Acute onset pain and swelling x3 days, can't bear weight 2/2 pain, no known injury - xray ankle and tib/fib negative - doppler negative for DVT - unclear etiology but likely some sort MSK injury, exam limited by pain/cooperation - crutches, WBAT, percocet for pain control - f/u Monday, will likely need sports med referral if not resolving at that point

## 2014-09-10 NOTE — Progress Notes (Signed)
   Subjective:    Patient ID: Nicole [name], female    DOB: Apr 11, 1976, 38 y.o.   MRN: [id number]  Foot Pain   Pt presents for eval of leg pain for the past 3 days. Patient reports severe pain up the lateral aspect of her leg from ankle to mid thigh. Started suddenly while walking but denies injury. Has tried ibuprofen which did not help. No other treatments. Worse with weight bearing, very tender. Has noted some swelling at the ankle only.   Review of Systems See HPI    Objective:   Physical Exam  Nursing note and vitals reviewed. Constitutional: She is oriented to person, place, and time. She appears well-developed and well-nourished. No distress.  HENT:  Head: Normocephalic and atraumatic.  Eyes: Conjunctivae are normal. Right eye exhibits no discharge. Left eye exhibits no discharge.  Cardiovascular: Normal rate.   Pulmonary/Chest: Effort normal.  Musculoskeletal:       Right knee: She exhibits bony tenderness. She exhibits normal range of motion, no swelling, no effusion, no ecchymosis, no deformity, no laceration, no erythema, normal alignment, no LCL laxity, normal patellar mobility, normal meniscus and no MCL laxity. Tenderness found. Lateral joint line tenderness noted.       Right ankle: She exhibits swelling. She exhibits normal range of motion, no ecchymosis, no deformity and no laceration. Tenderness. Lateral malleolus, medial malleolus, AITFL, CF ligament, posterior TFL, head of 5th metatarsal and proximal fibula tenderness found. Achilles tendon normal.       Right lower leg: She exhibits tenderness and bony tenderness. She exhibits no swelling, no edema, no deformity and no laceration.       Right foot: Normal. She exhibits normal range of motion.  Neurological: She is alert and oriented to person, place, and time.  Skin: Skin is warm and dry. She is not diaphoretic.  Psychiatric:  Tearful during exam          Assessment & Plan:

## 2014-09-17 ENCOUNTER — Encounter (HOSPITAL_COMMUNITY): Payer: Self-pay | Admitting: Emergency Medicine

## 2014-09-17 ENCOUNTER — Telehealth: Payer: Self-pay | Admitting: Family Medicine

## 2014-09-17 DIAGNOSIS — M542 Cervicalgia: Secondary | ICD-10-CM | POA: Diagnosis not present

## 2014-09-17 DIAGNOSIS — G40909 Epilepsy, unspecified, not intractable, without status epilepticus: Secondary | ICD-10-CM | POA: Diagnosis not present

## 2014-09-17 DIAGNOSIS — Z880 Allergy status to penicillin: Secondary | ICD-10-CM | POA: Diagnosis not present

## 2014-09-17 DIAGNOSIS — Z87891 Personal history of nicotine dependence: Secondary | ICD-10-CM | POA: Insufficient documentation

## 2014-09-17 DIAGNOSIS — Z8719 Personal history of other diseases of the digestive system: Secondary | ICD-10-CM | POA: Insufficient documentation

## 2014-09-17 DIAGNOSIS — J45909 Unspecified asthma, uncomplicated: Secondary | ICD-10-CM | POA: Insufficient documentation

## 2014-09-17 DIAGNOSIS — D649 Anemia, unspecified: Secondary | ICD-10-CM | POA: Insufficient documentation

## 2014-09-17 DIAGNOSIS — Z791 Long term (current) use of non-steroidal anti-inflammatories (NSAID): Secondary | ICD-10-CM | POA: Insufficient documentation

## 2014-09-17 DIAGNOSIS — G43901 Migraine, unspecified, not intractable, with status migrainosus: Secondary | ICD-10-CM | POA: Diagnosis not present

## 2014-09-17 DIAGNOSIS — Z8742 Personal history of other diseases of the female genital tract: Secondary | ICD-10-CM | POA: Insufficient documentation

## 2014-09-17 DIAGNOSIS — G43909 Migraine, unspecified, not intractable, without status migrainosus: Secondary | ICD-10-CM | POA: Diagnosis present

## 2014-09-17 DIAGNOSIS — Z79899 Other long term (current) drug therapy: Secondary | ICD-10-CM | POA: Diagnosis not present

## 2014-09-17 NOTE — ED Notes (Signed)
Pt. reports migraine headache onset today unrelieved by OTC Tylenol and Exedrin Migraine tabs , pain radiating to neck /mild blurred vision and nausea.

## 2014-09-17 NOTE — Telephone Encounter (Signed)
Patient called after hours  line complaining of migraine headache. Patient reports that her migraine is different from her priors.  She's experiencing neck pain and vision issues. She is taking Tylenol and Motrin with no relief. Given symptoms and difference from prior, I recommended she come in for evaluation.

## 2014-09-18 ENCOUNTER — Emergency Department (HOSPITAL_COMMUNITY)
Admission: EM | Admit: 2014-09-18 | Discharge: 2014-09-18 | Disposition: A | Discharge status: Home / Self Care | Payer: No Typology Code available for payment source | Attending: Emergency Medicine | Admitting: Emergency Medicine

## 2014-09-18 ENCOUNTER — Emergency Department (HOSPITAL_COMMUNITY): Payer: No Typology Code available for payment source

## 2014-09-18 DIAGNOSIS — G43901 Migraine, unspecified, not intractable, with status migrainosus: Secondary | ICD-10-CM

## 2014-09-18 LAB — CBC WITH DIFFERENTIAL/PLATELET
BASOS PCT: 1 % (ref 0–1)
Basophils Absolute: 0.1 10*3/uL (ref 0.0–0.1)
EOS ABS: 0.4 10*3/uL (ref 0.0–0.7)
Eosinophils Relative: 6 % — ABNORMAL HIGH (ref 0–5)
HEMATOCRIT: 34.7 % — AB (ref 36.0–46.0)
Hemoglobin: 12.3 g/dL (ref 12.0–15.0)
Lymphocytes Relative: 39 % (ref 12–46)
Lymphs Abs: 2.4 10*3/uL (ref 0.7–4.0)
MCH: 29.6 pg (ref 26.0–34.0)
MCHC: 35.4 g/dL (ref 30.0–36.0)
MCV: 83.6 fL (ref 78.0–100.0)
MONO ABS: 0.4 10*3/uL (ref 0.1–1.0)
Monocytes Relative: 7 % (ref 3–12)
NEUTROS ABS: 2.9 10*3/uL (ref 1.7–7.7)
Neutrophils Relative %: 47 % (ref 43–77)
Platelets: 241 10*3/uL (ref 150–400)
RBC: 4.15 MIL/uL (ref 3.87–5.11)
RDW: 12.7 % (ref 11.5–15.5)
WBC: 6.2 10*3/uL (ref 4.0–10.5)

## 2014-09-18 LAB — BASIC METABOLIC PANEL
Anion gap: 13 (ref 5–15)
BUN: 10 mg/dL (ref 6–23)
CHLORIDE: 105 meq/L (ref 96–112)
CO2: 22 mEq/L (ref 19–32)
Calcium: 9 mg/dL (ref 8.4–10.5)
Creatinine, Ser: 0.73 mg/dL (ref 0.50–1.10)
GFR calc non Af Amer: >90 mL/min (ref >90)
Glucose, Bld: 85 mg/dL (ref 70–99)
Potassium: 3.8 mEq/L (ref 3.7–5.3)
Sodium: 140 mEq/L (ref 137–147)

## 2014-09-18 MED ORDER — IBUPROFEN 600 MG PO TABS: 600.0000 mg | ORAL_TABLET | Freq: Four times a day (QID) | ORAL | Status: DC | PRN

## 2014-09-18 MED ORDER — IOHEXOL 350 MG/ML SOLN
50.0000 mL | Freq: Once | INTRAVENOUS | Status: AC | PRN
  Administered 2014-09-18: 50 mL via INTRAVENOUS

## 2014-09-18 MED ORDER — DEXAMETHASONE SODIUM PHOSPHATE 4 MG/ML IJ SOLN
10.0000 mg | Freq: Once | INTRAMUSCULAR | Status: AC
  Administered 2014-09-18: 10 mg via INTRAVENOUS
  Filled 2014-09-18: qty 3

## 2014-09-18 MED ORDER — METOCLOPRAMIDE HCL 5 MG/ML IJ SOLN
10.0000 mg | Freq: Once | INTRAMUSCULAR | Status: AC
  Administered 2014-09-18: 10 mg via INTRAVENOUS
  Filled 2014-09-18: qty 2

## 2014-09-18 MED ORDER — FENTANYL CITRATE 0.05 MG/ML IJ SOLN
50.0000 ug | Freq: Once | INTRAMUSCULAR | Status: AC
Start: 2014-09-18 — End: 2014-09-18
  Administered 2014-09-18: 50 ug via INTRAVENOUS
  Filled 2014-09-18: qty 2

## 2014-09-18 MED ORDER — SODIUM CHLORIDE 0.9 % IV SOLN
Freq: Once | INTRAVENOUS | Status: AC
  Administered 2014-09-18: 04:00:00 via INTRAVENOUS

## 2014-09-18 MED ORDER — DIPHENHYDRAMINE HCL 50 MG/ML IJ SOLN
25.0000 mg | Freq: Once | INTRAMUSCULAR | Status: AC
  Administered 2014-09-18: 25 mg via INTRAVENOUS
  Filled 2014-09-18: qty 1

## 2014-09-18 MED ORDER — KETOROLAC TROMETHAMINE 30 MG/ML IJ SOLN
30.0000 mg | Freq: Once | INTRAMUSCULAR | Status: AC
  Administered 2014-09-18: 30 mg via INTRAVENOUS
  Filled 2014-09-18: qty 1

## 2014-09-18 NOTE — Discharge Instructions (Signed)
We saw you in the ER for headaches. All the labs and imaging are normal. We are not sure what is causing your headaches, however, there appears to be no evidence of infection, bleeds or tumors based on our exam and results.  Please take motrin round the clock for the next 6 hours, and take other meds prescribed only for break through pain. See your doctor if the pain persists, as you might need better medications or a specialist. Please return to the ER if your symptoms worsen; you have increased pain, fevers, chills, inability to keep any medications down, confusion. Otherwise see the outpatient doctor as requested.   Headaches, Frequently Asked Questions MIGRAINE HEADACHES Q: What is migraine? What causes it? How can I treat it? A: Generally, migraine headaches begin as a dull ache. Then they develop into a constant, throbbing, and pulsating pain. You may experience pain at the temples. You may experience pain at the front or back of one or both sides of the head. The pain is usually accompanied by a combination of:  Nausea.  Vomiting.  Sensitivity to light and noise. Some people (about 15%) experience an aura (see below) before an attack. The cause of migraine is believed to be chemical reactions in the brain. Treatment for migraine may include over-the-counter or prescription medications. It may also include self-help techniques. These include relaxation training and biofeedback.  Q: What is an aura? A: About 15% of people with migraine get an "aura". This is a sign of neurological symptoms that occur before a migraine headache. You may see wavy or jagged lines, dots, or flashing lights. You might experience tunnel vision or blind spots in one or both eyes. The aura can include visual or auditory hallucinations (something imagined). It may include disruptions in smell (such as strange odors), taste or touch. Other symptoms include:  Numbness.  A "pins and needles" sensation.  Difficulty  in recalling or speaking the correct word. These neurological events may last as long as 60 minutes. These symptoms will fade as the headache begins. Q: What is a trigger? A: Certain physical or environmental factors can lead to or "trigger" a migraine. These include:  Foods.  Hormonal changes.  Weather.  Stress. It is important to remember that triggers are different for everyone. To help prevent migraine attacks, you need to figure out which triggers affect you. Keep a headache diary. This is a good way to track triggers. The diary will help you talk to your healthcare professional about your condition. Q: Does weather affect migraines? A: Bright sunshine, hot, humid conditions, and drastic changes in barometric pressure may lead to, or "trigger," a migraine attack in some people. But studies have shown that weather does not act as a trigger for everyone with migraines. Q: What is the link between migraine and hormones? A: Hormones start and regulate many of your body's functions. Hormones keep your body in balance within a constantly changing environment. The levels of hormones in your body are unbalanced at times. Examples are during menstruation, pregnancy, or menopause. That can lead to a migraine attack. In fact, about three quarters of all women with migraine report that their attacks are related to the menstrual cycle.  Q: Is there an increased risk of stroke for migraine sufferers? A: The likelihood of a migraine attack causing a stroke is very remote. That is not to say that migraine sufferers cannot have a stroke associated with their migraines. In persons under age 27, the most common associated  factor for stroke is migraine headache. But over the course of a person's normal life span, the occurrence of migraine headache may actually be associated with a reduced risk of dying from cerebrovascular disease due to stroke.  Q: What are acute medications for migraine? A: Acute medications  are used to treat the pain of the headache after it has started. Examples over-the-counter medications, NSAIDs, ergots, and triptans.  Q: What are the triptans? A: Triptans are the newest class of abortive medications. They are specifically targeted to treat migraine. Triptans are vasoconstrictors. They moderate some chemical reactions in the brain. The triptans work on receptors in your brain. Triptans help to restore the balance of a neurotransmitter called serotonin. Fluctuations in levels of serotonin are thought to be a main cause of migraine.  Q: Are over-the-counter medications for migraine effective? A: Over-the-counter, or "OTC," medications may be effective in relieving mild to moderate pain and associated symptoms of migraine. But you should see your caregiver before beginning any treatment regimen for migraine.  Q: What are preventive medications for migraine? A: Preventive medications for migraine are sometimes referred to as "prophylactic" treatments. They are used to reduce the frequency, severity, and length of migraine attacks. Examples of preventive medications include antiepileptic medications, antidepressants, beta-blockers, calcium channel blockers, and NSAIDs (nonsteroidal anti-inflammatory drugs). Q: Why are anticonvulsants used to treat migraine? A: During the past few years, there has been an increased interest in antiepileptic drugs for the prevention of migraine. They are sometimes referred to as "anticonvulsants". Both epilepsy and migraine may be caused by similar reactions in the brain.  Q: Why are antidepressants used to treat migraine? A: Antidepressants are typically used to treat people with depression. They may reduce migraine frequency by regulating chemical levels, such as serotonin, in the brain.  Q: What alternative therapies are used to treat migraine? A: The term "alternative therapies" is often used to describe treatments considered outside the scope of  conventional Western medicine. Examples of alternative therapy include acupuncture, acupressure, and yoga. Another common alternative treatment is herbal therapy. Some herbs are believed to relieve headache pain. Always discuss alternative therapies with your caregiver before proceeding. Some herbal products contain arsenic and other toxins. TENSION HEADACHES Q: What is a tension-type headache? What causes it? How can I treat it? A: Tension-type headaches occur randomly. They are often the result of temporary stress, anxiety, fatigue, or anger. Symptoms include soreness in your temples, a tightening band-like sensation around your head (a "vice-like" ache). Symptoms can also include a pulling feeling, pressure sensations, and contracting head and neck muscles. The headache begins in your forehead, temples, or the back of your head and neck. Treatment for tension-type headache may include over-the-counter or prescription medications. Treatment may also include self-help techniques such as relaxation training and biofeedback. CLUSTER HEADACHES Q: What is a cluster headache? What causes it? How can I treat it? A: Cluster headache gets its name because the attacks come in groups. The pain arrives with little, if any, warning. It is usually on one side of the head. A tearing or bloodshot eye and a runny nose on the same side of the headache may also accompany the pain. Cluster headaches are believed to be caused by chemical reactions in the brain. They have been described as the most severe and intense of any headache type. Treatment for cluster headache includes prescription medication and oxygen. SINUS HEADACHES Q: What is a sinus headache? What causes it? How can I treat it? A: When a  cavity in the bones of the face and skull (a sinus) becomes inflamed, the inflammation will cause localized pain. This condition is usually the result of an allergic reaction, a tumor, or an infection. If your headache is caused  by a sinus blockage, such as an infection, you will probably have a fever. An x-ray will confirm a sinus blockage. Your caregiver's treatment might include antibiotics for the infection, as well as antihistamines or decongestants.  REBOUND HEADACHES Q: What is a rebound headache? What causes it? How can I treat it? A: A pattern of taking acute headache medications too often can lead to a condition known as "rebound headache." A pattern of taking too much headache medication includes taking it more than 2 days per week or in excessive amounts. That means more than the label or a caregiver advises. With rebound headaches, your medications not only stop relieving pain, they actually begin to cause headaches. Doctors treat rebound headache by tapering the medication that is being overused. Sometimes your caregiver will gradually substitute a different type of treatment or medication. Stopping may be a challenge. Regularly overusing a medication increases the potential for serious side effects. Consult a caregiver if you regularly use headache medications more than 2 days per week or more than the label advises. ADDITIONAL QUESTIONS AND ANSWERS Q: What is biofeedback? A: Biofeedback is a self-help treatment. Biofeedback uses special equipment to monitor your body's involuntary physical responses. Biofeedback monitors:  Breathing.  Pulse.  Heart rate.  Temperature.  Muscle tension.  Brain activity. Biofeedback helps you refine and perfect your relaxation exercises. You learn to control the physical responses that are related to stress. Once the technique has been mastered, you do not need the equipment any more. Q: Are headaches hereditary? A: Four out of five (80%) of people that suffer report a family history of migraine. Scientists are not sure if this is genetic or a family predisposition. Despite the uncertainty, a child has a 50% chance of having migraine if one parent suffers. The child has a  75% chance if both parents suffer.  Q: Can children get headaches? A: By the time they reach high school, most young people have experienced some type of headache. Many safe and effective approaches or medications can prevent a headache from occurring or stop it after it has begun.  Q: What type of doctor should I see to diagnose and treat my headache? A: Start with your primary caregiver. Discuss his or her experience and approach to headaches. Discuss methods of classification, diagnosis, and treatment. Your caregiver may decide to recommend you to a headache specialist, depending upon your symptoms or other physical conditions. Having diabetes, allergies, etc., may require a more comprehensive and inclusive approach to your headache. The National Headache Foundation will provide, upon request, a list of [facility] physician members in your state. Document Released: 02/17/2004 Document Revised: 02/19/2012 Document Reviewed: 07/27/2008 [facility] Patient Information 2015 [place], [place]. This information is not intended to replace advice given to you by your health care provider. Make sure you discuss any questions you have with your health care provider.  Migraine Headache A migraine headache is very bad, throbbing pain on one or both sides of your head. Talk to your doctor about what things may bring on (trigger) your migraine headaches. HOME CARE  Only take medicines as told by your doctor.  Lie down in a , quiet room when you have a migraine.  Keep a journal to find out if certain things bring  on migraine headaches. For example, write down:  What you eat and drink.  How much sleep you get.  Any change to your diet or medicines.  Lessen how much alcohol you drink.  Quit smoking if you smoke.  Get enough sleep.  Lessen any stress in your life.  Keep lights dim if bright lights bother you or make your migraines worse. GET HELP RIGHT AWAY IF:   Your migraine becomes really bad.  You  have a fever.  You have a stiff neck.  You have trouble seeing.  Your muscles are weak, or you lose muscle control.  You lose your balance or have trouble walking.  You feel like you will pass out (faint), or you pass out.  You have really bad symptoms that are different than your first symptoms. MAKE SURE YOU:   Understand these instructions.  Will watch your condition.  Will get help right away if you are not doing well or get worse. Document Released: 09/05/2008 Document Revised: 02/19/2012 Document Reviewed: 08/04/2013 [facility] Patient Information 2015 [place], [place]. This information is not intended to replace advice given to you by your health care provider. Make sure you discuss any questions you have with your health care provider.

## 2014-09-18 NOTE — ED Provider Notes (Addendum)
CSN: [id number]     Arrival date & time 09/17/14  2156 History   First MD Initiated Contact with Patient 09/18/14 0128     Chief Complaint  Patient presents with  . Migraine     (Consider location/radiation/quality/duration/timing/severity/associated sxs/prior Treatment) HPI Comments: Pt comes in with cc of headaches. Pt states that the headaches started today, around 8:30. Headaches are bilateral frontal, and radiating to the back and her neck. She has hx of migraines, and the headaches are photosensitive and constant, throbbing type - however, she typically doesn't get pain to the neck, and usually there is some response with tylenol, excedrins - which she didn't have today. There is + nausea, but no vomiting, visual complains, seizures, altered mental status, loss of consciousness, new weakness, or numbness, no gait instability. Pt has family hx of brain aneurysm - uncle.    Patient is a 38 y.o. female presenting with migraines. The history is provided by the patient.  Migraine Associated symptoms include headaches. Pertinent negatives include no chest pain, no abdominal pain and no shortness of breath.    Past Medical History  Diagnosis Date  . Anemia   . Asthma   . Blood transfusion 2011    r/t anemia  . Migraine   . Sickle cell trait   . Seizures     last sz Jan 2012  . Seizures   . Fibroid   . Tooth abscess   . Sickle cell trait   . Migraine    Past Surgical History  Procedure Laterality Date  . Tubal ligation Bilateral 2005   Family History  Problem Relation Age of Onset  . Anesthesia problems Neg Hx   . Hypertension Mother   . Diabetes Mother   . Cancer Mother     Colon, brain, 2 other  . Asthma Mother   . Clotting disorder Mother   . Heart disease Mother     48, stents  . Stroke Mother     2012  . Diabetes Father   . Asthma Father   . Clotting disorder Father   . Sickle cell anemia Father   . Heart disease Father     2000, stents  . Hypertension  Father   . Stroke Father     2007  . Diabetes Maternal Grandmother   . Asthma Sister   . Asthma Brother   . Asthma Paternal Grandmother    History  Substance Use Topics  . Smoking status: Former Research scientist (life sciences)  . Smokeless tobacco: Never Used  . Alcohol Use: No   OB History   Grav Para Term Preterm Abortions TAB SAB Ect Mult Living   3 2 1 1 1  0 0 1 0 2     Review of Systems  Constitutional: Positive for activity change.  Eyes: Positive for photophobia. Negative for visual disturbance.  Respiratory: Negative for shortness of breath.   Cardiovascular: Negative for chest pain.  Gastrointestinal: Positive for nausea. Negative for vomiting and abdominal pain.  Genitourinary: Negative for dysuria.  Musculoskeletal: Positive for neck pain. Negative for back pain.  Skin: Negative for rash.  Allergic/Immunologic: Negative for immunocompromised state.  Neurological: Positive for headaches. Negative for weakness and numbness.      Allergies  Penicillins and Sulfa antibiotics  Home Medications   Prior to Admission medications   Medication Sig Start Date End Date Taking? Authorizing Provider  albuterol (PROVENTIL HFA;VENTOLIN HFA) 108 (90 BASE) MCG/ACT inhaler Inhale 2 puffs into the lungs every 6 (six) hours as needed  for shortness of breath. Shortness of breath   Yes Historical Provider, MD  colchicine 0.6 MG tablet Take 0.6 mg by mouth 2 (two) times daily. 01/20/14  Yes Dorothy Spark, MD  diphenhydrAMINE (BENADRYL) 25 mg capsule Take 25 mg by mouth every 4 (four) hours as needed for allergies.   Yes Historical Provider, MD  fexofenadine (ALLEGRA) 180 MG tablet Take 1 tablet (180 mg total) by mouth daily. 08/13/14  Yes Leone Brand, MD  ibuprofen (ADVIL,MOTRIN) 200 MG tablet Take 600 mg by mouth every 6 (six) hours as needed for moderate pain.    Yes Historical Provider, MD  indomethacin (INDOCIN) 25 MG capsule Take 1 capsule (25 mg total) by mouth 3 (three) times daily with meals. May  take up to 50mg  three times a day if no improvement with 25mg . 08/02/14  Yes Alvina Chou, PA-C  indomethacin (INDOCIN) 50 MG capsule Take 1 capsule (50 mg total) by mouth 3 (three) times daily with meals. 01/20/14  Yes Dorothy Spark, MD  levETIRAcetam (KEPPRA) 500 MG tablet Take 1 tablet (500 mg total) by mouth 2 (two) times daily. 06/04/14  Yes Kalman Drape, MD  medroxyPROGESTERone (DEPO-PROVERA) 150 MG/ML injection Inject 150 mg into the muscle every 3 (three) months. Inject on  the 22nd of each month 02/11/14  Yes Shelly Bombard, MD  omeprazole (PRILOSEC) 20 MG capsule Take 1 capsule (20 mg total) by mouth 2 (two) times daily before a meal. 01/28/14  Yes Shelly Bombard, MD  oxyCODONE-acetaminophen (PERCOCET/ROXICET) 5-325 MG per tablet Take 1 tablet by mouth every 8 (eight) hours as needed for severe pain. 09/04/14  Yes Frazier Richards, MD  Prenatal Vit-Fe Fumarate-FA (PNV PRENATAL PLUS MULTIVITAMIN) 27-1 MG TABS Take 1 tablet by mouth daily. 07/22/14  Yes Historical Provider, MD   BP 121/75  Pulse 76  Temp(Src) 98.5 F (36.9 C) (Oral)  Resp 12  SpO2 98% Physical Exam  Nursing note and vitals reviewed. Constitutional: She is oriented to person, place, and time. She appears well-developed and well-nourished.  HENT:  Head: Normocephalic and atraumatic.  Eyes: EOM are normal. Pupils are equal, round, and reactive to light.  Neck: Neck supple. No JVD present.  No bruit  Cardiovascular: Normal rate, regular rhythm and normal heart sounds.   No murmur heard. Pulmonary/Chest: Effort normal. No respiratory distress.  Abdominal: Soft. She exhibits no distension. There is no tenderness. There is no rebound and no guarding.  Neurological: She is alert and oriented to person, place, and time. No cranial nerve deficit. Coordination normal.  Skin: Skin is warm and dry.    ED Course  Procedures (including critical care time) Labs Review Labs Reviewed  CBC WITH DIFFERENTIAL  BASIC METABOLIC  PANEL    Imaging Review No results found.   EKG Interpretation None      MDM   Final diagnoses:  None    Angiocath insertion Performed by: Varney Biles  Consent: Verbal consent obtained. Risks and benefits: risks, benefits and alternatives were discussed Time out: Immediately prior to procedure a "time out" was called to verify the correct patient, procedure, equipment, support staff and site/side marked as required.  Preparation: Patient was prepped and draped in the usual sterile fashion.  Vein Location: right antecubital fossa  Ultrasound Guided  Gauge: 20  Normal blood return and flush without difficulty Patient tolerance: Patient tolerated the procedure well with no immediate complications.   DDX includes: Primary headaches - including migrainous headaches, cluster headaches, tension  headaches. ICH Carotid dissection Cavernous sinus thrombosis Meningitis Encephalitis Sinusitis Tumor Vascular headaches AV malformation Brain aneurysm Muscular headaches  A/P: Pt comes in with cc of headaches. Hx of migraines, but states that the current headaches are different as far as location is concerned, severity is concerned. Also the current headaches not responding to meds as they typically do. Pt has fam hx of brain AN, uncle and grand mother. With the neck pain, we are concerned that there might be Brain AN. CTA ordered. Pt has no risk factors for SAH, and we deem SAH unlikely. There is no fevers, and infectious cause considered unlikely. If CT-A neg, will treat this as a complex migraine. No neuro deficits is reassuring.   Varney Biles, MD 09/18/14 0406  8:31 AM Pain improved upon reassessment to moderate. Patient wanted to wait few more minutes to ensure pain continued to improve. Discussion for LP was done at that time. Upon reassessment now, patient states that her pain is much better. We will now work on discharge. Return precautions have been  discussed. Pt aware that there is a small chance of SAH currently.   Varney Biles, MD 09/18/14 [phone]

## 2014-10-05 IMAGING — US US PELVIS COMPLETE
1 series · 13 of 25 positions shown · non-contrast
Comparison: 06/18/2013

CLINICAL DATA: Pelvic pain.  Fibroids.  On Depo-Provera.

EXAM:
TRANSABDOMINAL AND TRANSVAGINAL ULTRASOUND OF PELVIS
TECHNIQUE: Both transabdominal and transvaginal ultrasound examinations of the
pelvis were performed. Transabdominal technique was performed for
global imaging of the pelvis including uterus, ovaries, adnexal
regions, and pelvic cul-de-sac. It was necessary to proceed with
endovaginal exam following the transabdominal exam to visualize the
endometrium and ovaries.

[Series 1: us pelvis complete · 52 acquisitions, 13 frames shown]
[im 1/52]
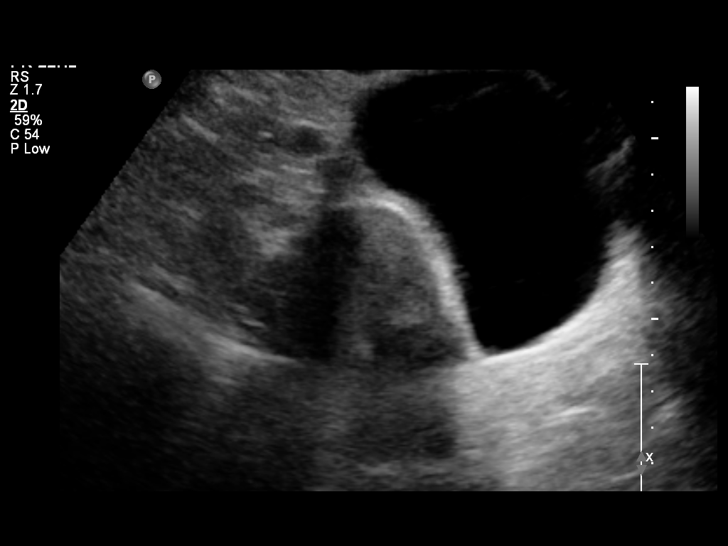
[im 5/52]
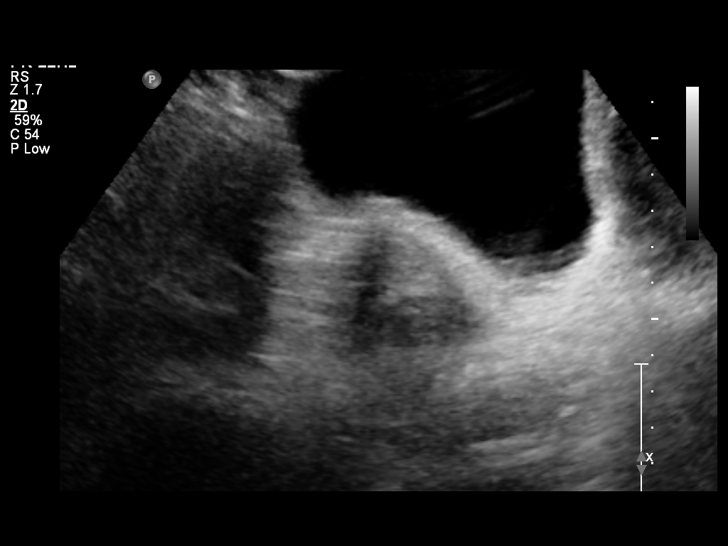
[im 9/52]
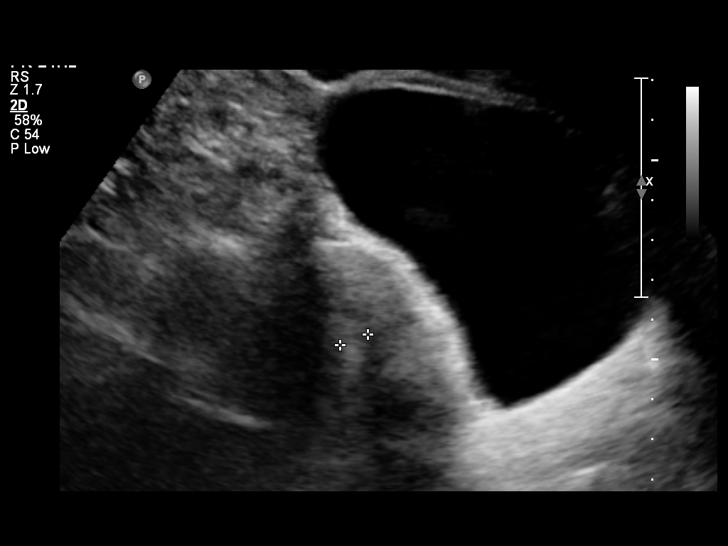
[im 13/52]
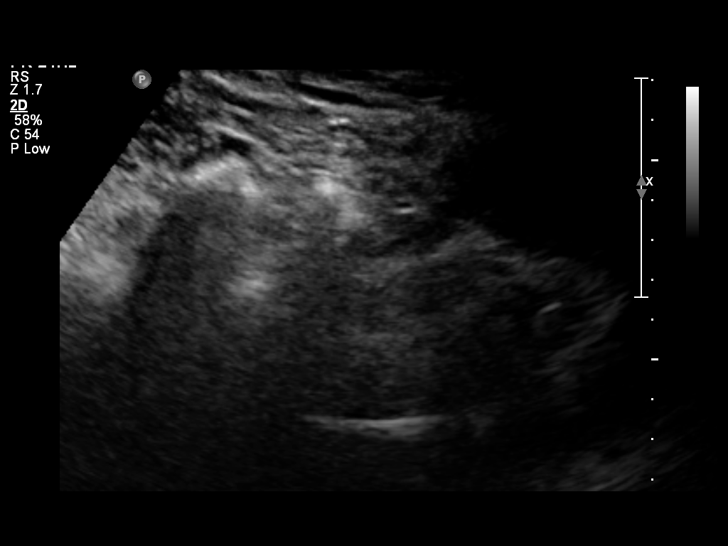
[im 18/52]
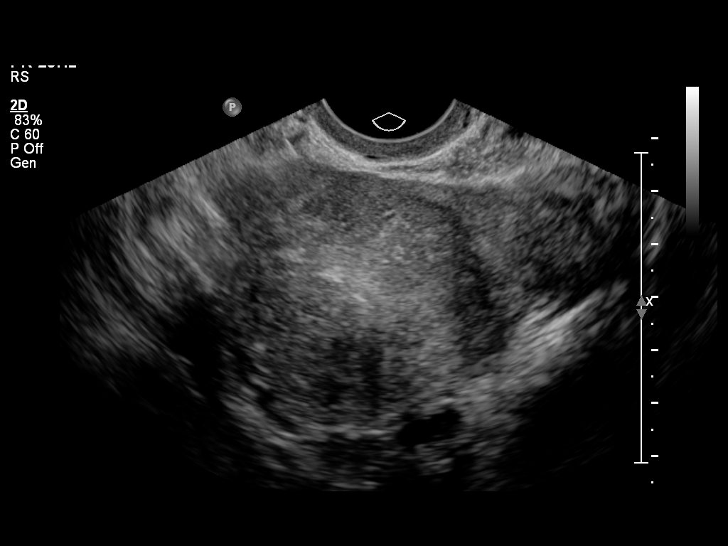
[im 22/52]
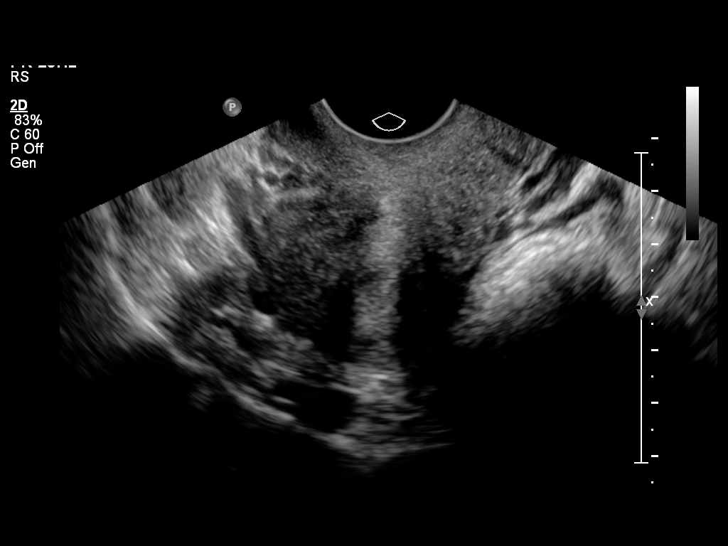
[im 26/52]
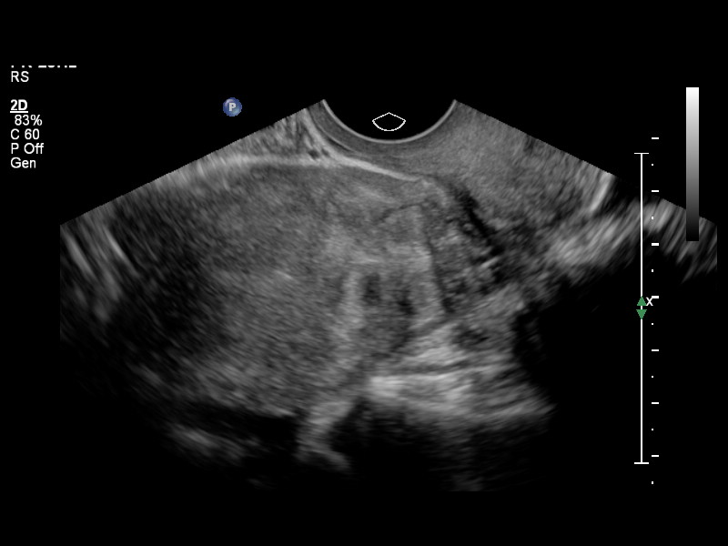
[im 30/52]
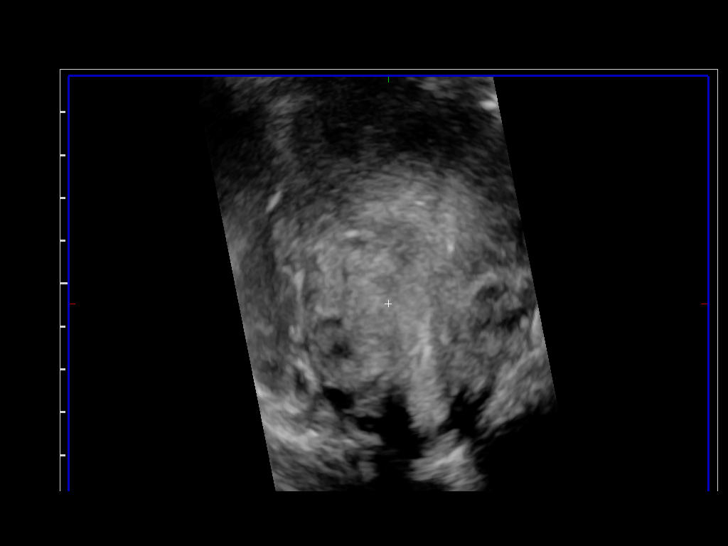
[im 35/52]
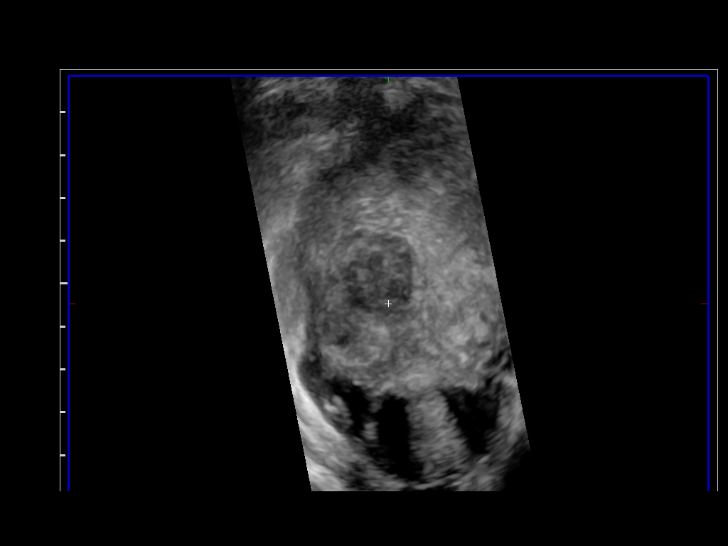
[im 39/52]
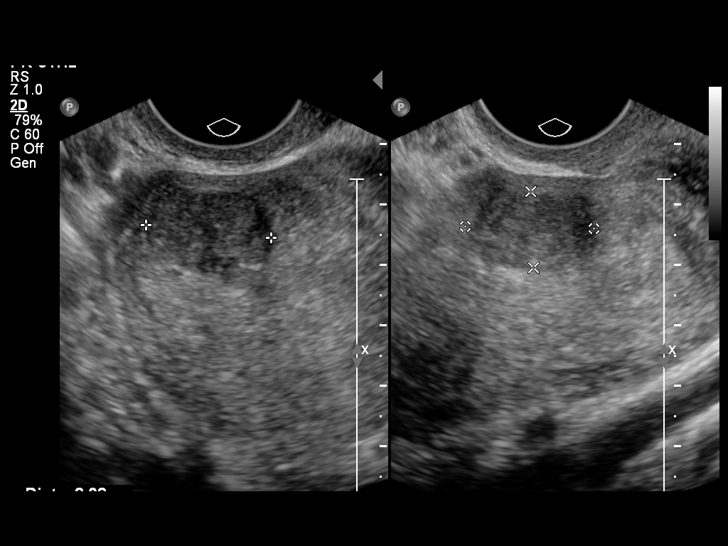
[im 43/52]
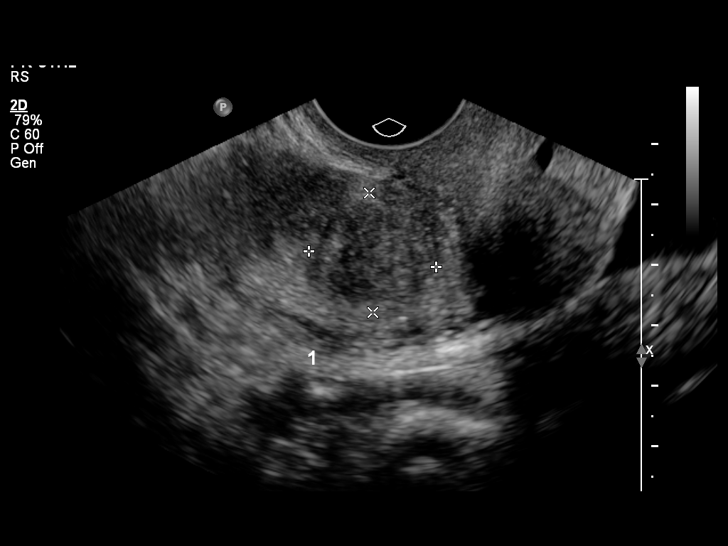
[im 47/52]
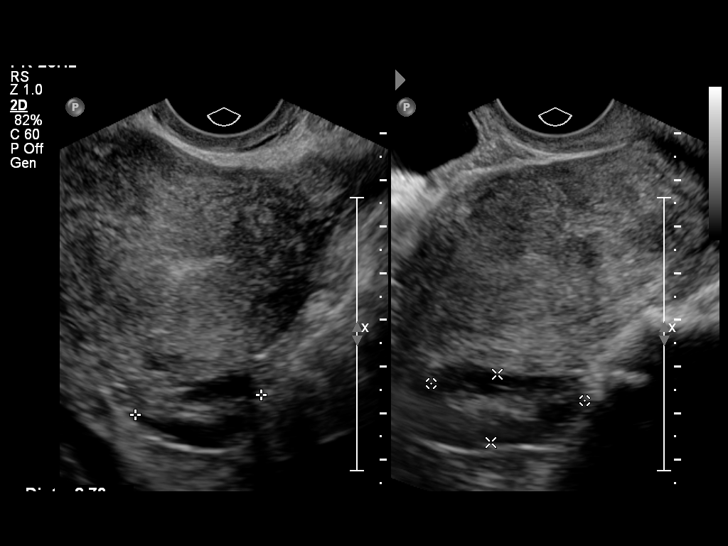
[im 52/52]
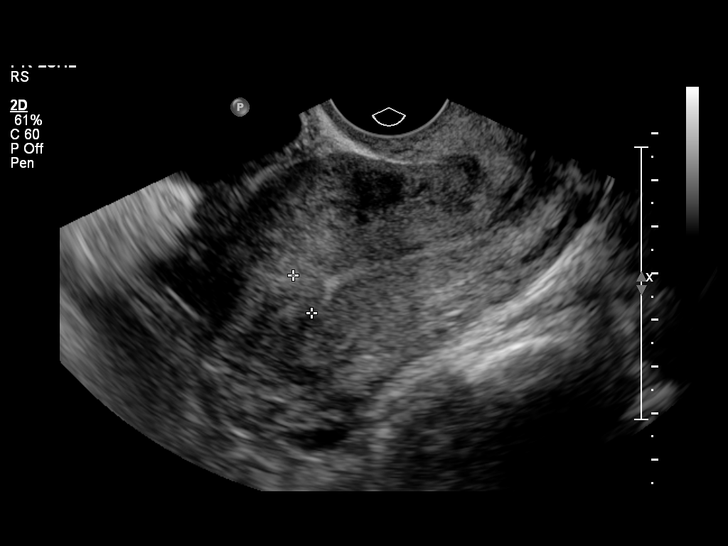

[13 of 25 positions shown; findings below may reference images not displayed]

FINDINGS: Uterus

Measurements: 8.6 x 4.9 x 5.8 cm. Several small fibroids are seen.
The largest is a subserosal fibroid in the left fundal region which
measures 3.1 cm. At least 3 other smaller fibroids are seen which
measure 2.1 cm, 2.1 cm and 1.4 cm in maximum diameter. . The 2.1 cm
fibroid located in the right lateral corpus has a submucosal
component indenting the endometrial cavity.

Endometrium

Thickness: 9 mm. No focal abnormality visualized except for
submucosal fibroid noted above.

Right ovary

Measurements: 2.7 x 1.5 x 2.3 cm. Normal appearance/no adnexal mass.

Left ovary

Measurements: 3.5 x 1.7 x 3.5 cm. Normal appearance/no adnexal mass.

Other findings

Tiny amount of free fluid in cul-de-sac.
IMPRESSION: No significant change in several small uterine fibroids measuring up
to 3.1 cm. A 2 cm fibroid in the right lateral corpus has a
submucosal component.

Normal appearance of both ovaries.  No adnexal mass identified.

## 2014-10-07 IMAGING — CR DG SHOULDER 2+V*R*
3 series · 3 of 3 positions shown · non-contrast
Comparison: 07/02/2007

CLINICAL DATA: Right superior shoulder pain

EXAM:
RIGHT SHOULDER - 2+ VIEW

[w shoulder external right]
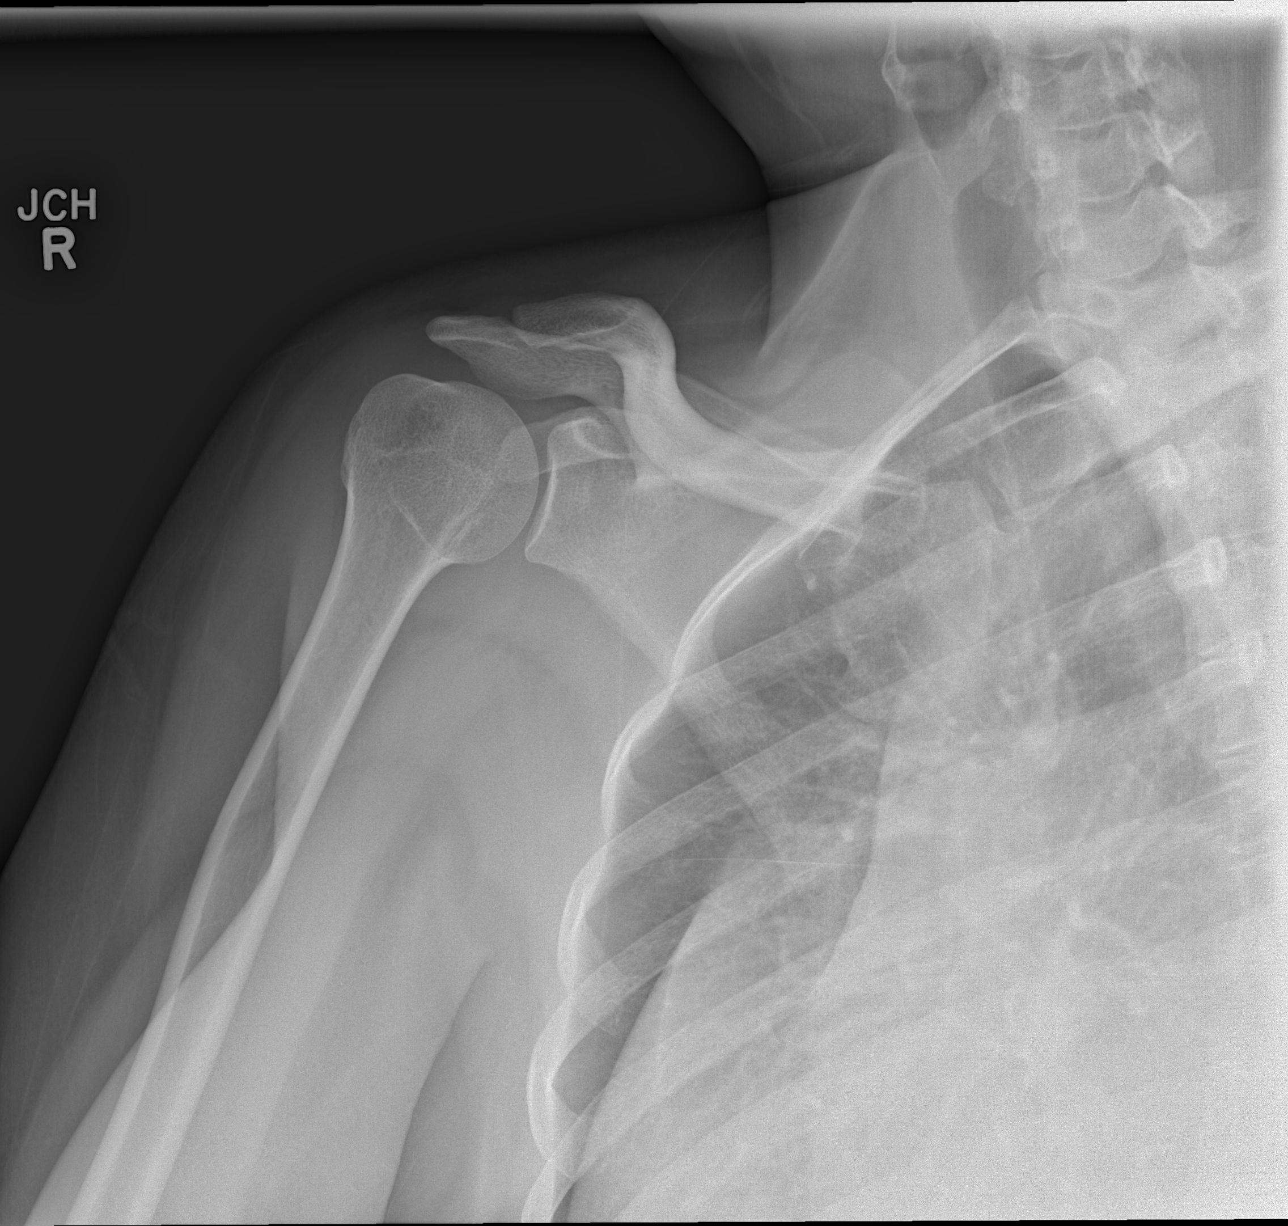

[w shoulder y-view right]
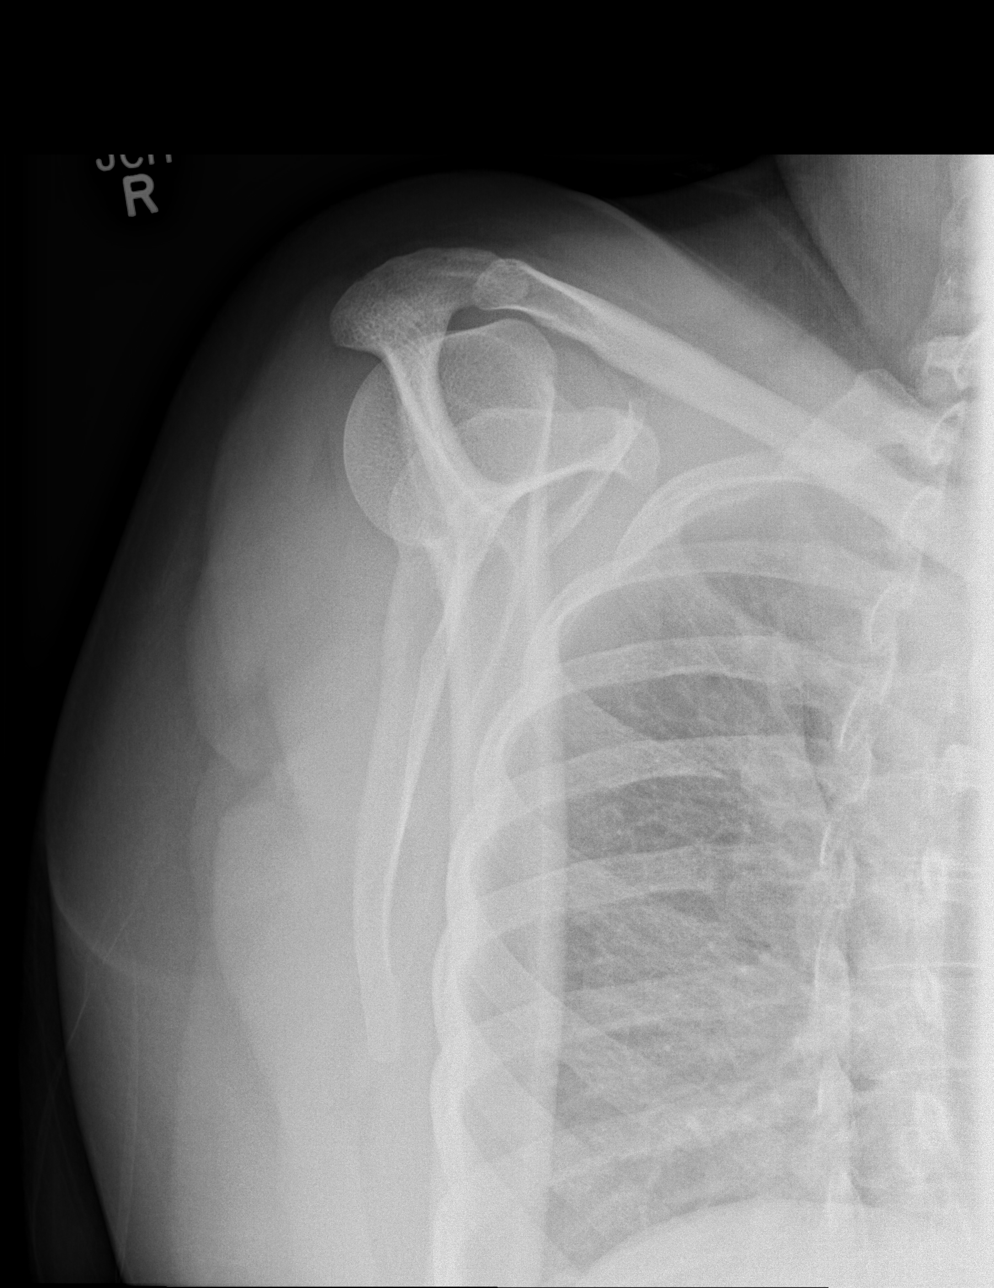

[x shoulder axillary right]
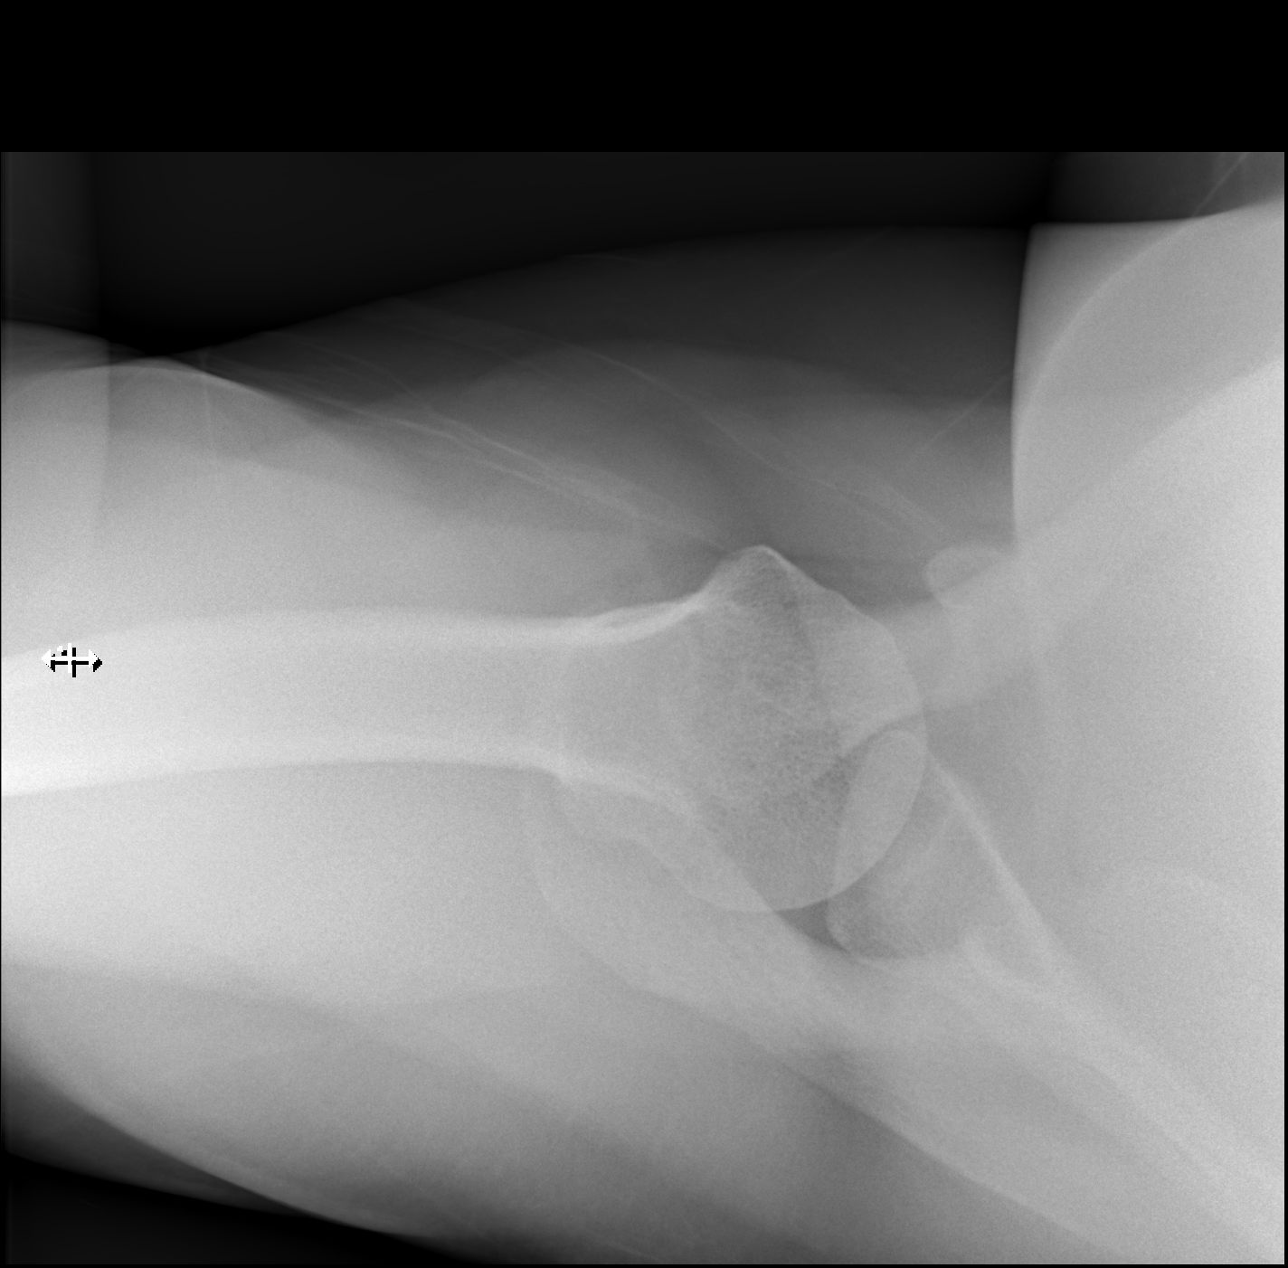

[3 of 3 positions shown; findings below may reference images not displayed]

FINDINGS: No fracture or dislocation is seen.

The joint spaces are preserved.

The visualized soft tissues are unremarkable.

Visualized right lung is clear.
IMPRESSION: No fracture or dislocation is seen.

## 2014-10-08 ENCOUNTER — Telehealth: Payer: Self-pay | Admitting: Family Medicine

## 2014-10-08 NOTE — Telephone Encounter (Signed)
[name] Phone Call  Patient calls stating she is aching, has cough, rhinorrhea, sneezing, congestion, nausea, and chills. She denies fever and shortness of breath. This started 2 days ago. She has been drinking lots of water and gatorade. Some nausea. She tried tylenol sinus and this was not beneficial. She has been taking allegra as well. Discussed that this is likely a URI. Advised to stay well hydrated. To continue OTC sinus medication. Scheduled f/u with Dr Wendi Snipes for same day appointment 10/30 at 11:00 am. Discussed reasons to seek medical attention tonight.   Tommi Rumps, MD

## 2014-10-09 ENCOUNTER — Encounter: Payer: Self-pay | Admitting: Family Medicine

## 2014-10-09 ENCOUNTER — Ambulatory Visit (INDEPENDENT_AMBULATORY_CARE_PROVIDER_SITE_OTHER): Payer: No Typology Code available for payment source | Admitting: Family Medicine

## 2014-10-09 VITALS — BP 119/82 | HR 87 | Temp 98.2°F | Wt 244.0 lb

## 2014-10-09 DIAGNOSIS — J069 Acute upper respiratory infection, unspecified: Secondary | ICD-10-CM

## 2014-10-09 DIAGNOSIS — J019 Acute sinusitis, unspecified: Secondary | ICD-10-CM | POA: Insufficient documentation

## 2014-10-09 MED ORDER — HYDROCODONE-HOMATROPINE 5-1.5 MG/5ML PO SYRP: 5.0000 mL | ORAL_SOLUTION | Freq: Three times a day (TID) | ORAL | Status: DC | PRN

## 2014-10-09 NOTE — Progress Notes (Signed)
Patient ID: PATTE WINKEL, female   DOB: Jun 18, 1976, 38 y.o.   MRN: [id number]   HPI  Patient presents today for cold symptoms  Over the past 4 days she has had worsening productive cough, sneezing, congestion, fatigue, malaise, and body aches. She has decreased appetite and some intermittent nausea but is drinking fluids easily.   She has mid sternal chest pain with coughing  She has not had a fever but has had chills one day ago.   Smoking status noted ROS: Per HPI  Objective: BP 119/82  Pulse 87  Temp(Src) 98.2 F (36.8 C) (Oral)  Wt 244 lb (110.678 kg) Gen: NAD, alert, cooperative with exam HEENT: NCAT, Turbinates swollen and boggy/bluish discoloration L>R CV: RRR, good S1/S2, no murmur Resp: CTABL, no wheezes, non-labored Ext: No edema, warm Neuro: Alert and oriented, No gross deficits  Assessment and plan:  URI (upper respiratory infection) Multiple symptoms consistent with viral etiology.  Body aches and cough, will rx hycodan Schedule tylenol Lung exam benign and non toxic appearing.  Discussed red flags and reasons to return for care.       Meds ordered this encounter  Medications  . HYDROcodone-homatropine (HYCODAN) 5-1.5 MG/5ML syrup    Sig: Take 5 mLs by mouth every 8 (eight) hours as needed for cough.    Dispense:  120 mL    Refill:  0

## 2014-10-09 NOTE — Patient Instructions (Signed)
Great to meet you!   I think you have a bad virus or the flu.   Try the cough medicine for your aches and coughs Take 2 tylenol three times per day.   Cough, Adult  A cough is a reflex that helps clear your throat and airways. It can help heal the body or may be a reaction to an irritated airway. A cough may only last 2 or 3 weeks (acute) or may last more than 8 weeks (chronic).  CAUSES Acute cough:  Viral or bacterial infections. Chronic cough:  Infections.  Allergies.  Asthma.  Post-nasal drip.  Smoking.  Heartburn or acid reflux.  Some medicines.  Chronic lung problems (COPD).  Cancer. SYMPTOMS   Cough.  Fever.  Chest pain.  Increased breathing rate.  High-pitched whistling sound when breathing (wheezing).  Colored mucus that you cough up (sputum). TREATMENT   A bacterial cough may be treated with antibiotic medicine.  A viral cough must run its course and will not respond to antibiotics.  Your caregiver may recommend other treatments if you have a chronic cough. HOME CARE INSTRUCTIONS   Only take over-the-counter or prescription medicines for pain, discomfort, or fever as directed by your caregiver. Use cough suppressants only as directed by your caregiver.  Use a cold steam vaporizer or humidifier in your bedroom or home to help loosen secretions.  Sleep in a semi-upright position if your cough is worse at night.  Rest as needed.  Stop smoking if you smoke. SEEK IMMEDIATE MEDICAL CARE IF:   Your cough starts to worsen.  You cannot control your cough with suppressants and are losing sleep.  You begin coughing up blood.  You have difficulty breathing.  You develop pain which is getting worse or is uncontrolled with medicine.  You have a fever. MAKE SURE YOU:   Understand these instructions.  Will watch your condition.  Will get help right away if you are not doing well or get worse. Document Released: 05/26/2011 Document Revised:  02/19/2012 Document Reviewed: 05/26/2011 [facility] Patient Information 2015 [place], [place]. This information is not intended to replace advice given to you by your health care provider. Make sure you discuss any questions you have with your health care provider.

## 2014-10-09 NOTE — Assessment & Plan Note (Signed)
Multiple symptoms consistent with viral etiology.  Body aches and cough, will rx hycodan Schedule tylenol Lung exam benign and non toxic appearing.  Discussed red flags and reasons to return for care.

## 2014-10-12 ENCOUNTER — Encounter: Payer: Self-pay | Admitting: Family Medicine

## 2014-10-21 ENCOUNTER — Ambulatory Visit (INDEPENDENT_AMBULATORY_CARE_PROVIDER_SITE_OTHER): Payer: No Typology Code available for payment source | Admitting: Family Medicine

## 2014-10-21 ENCOUNTER — Encounter: Payer: Self-pay | Admitting: Family Medicine

## 2014-10-21 VITALS — BP 122/86 | HR 86 | Temp 98.2°F | Ht 65.0 in | Wt 246.8 lb

## 2014-10-21 DIAGNOSIS — G4733 Obstructive sleep apnea (adult) (pediatric): Secondary | ICD-10-CM

## 2014-10-21 DIAGNOSIS — J019 Acute sinusitis, unspecified: Secondary | ICD-10-CM

## 2014-10-21 MED ORDER — AZITHROMYCIN 250 MG PO TABS: ORAL_TABLET | ORAL | Status: DC

## 2014-10-21 NOTE — Assessment & Plan Note (Signed)
Clinically consistent with chronic OSA with reported multiple apnea events nightly x 6 months, now worsening excessive day-time fatigue, dyspnea - No prior sleep study. Obesity and exam suggestive - recent work-up with bilateral duplex (negative for DVT, 08/2014), no tachycardia unlikely PE - Followed by Cardiology, Dr. Meda Coffee - had ECHO and Stress MRI performed 2015  Plan: 1. Referral for Sleep Study placed, anticipate will need to repeat with CPAP titration in future pending results 2. RTC 1-2 months to discuss results and further testing

## 2014-10-21 NOTE — Patient Instructions (Addendum)
Dear Nicole [name], Thank you for coming in to clinic today.  1. I will go ahead and treat you with antibiotics for a possible Sinus Infection - take Azithromycin 2 tablets on Day 1, then 1 tablet every day for 4 days. 2. Continue other cold medicines for relief. Continue nasal spray. 3. Most importantly - will send referral for a Sleep Study - I think that you may have Obstructive Sleep Apnea 4. You will get called with appointment for this, you may need a CPAP breathing machine at night in the future  Please schedule a follow-up appointment with Dr. Lamar Benes within 2 to 4 weeks for follow-up if not improving on antibiotics Or to discuss Sleep Study.  If you have any other questions or concerns, please feel free to call the clinic to contact me. You may also schedule an earlier appointment if necessary.  However, if your symptoms get significantly worse, please go to the Emergency Department to seek immediate medical attention.  Nobie Putnam, DO  Family Medicine   Sleep Apnea  Sleep apnea is a sleep disorder characterized by abnormal pauses in breathing while you sleep. When your breathing pauses, the level of oxygen in your blood decreases. This causes you to move out of deep sleep and into light sleep. As a result, your quality of sleep is poor, and the system that carries your blood throughout your body (cardiovascular system) experiences stress. If sleep apnea remains untreated, the following conditions can develop:  High blood pressure (hypertension).  Coronary artery disease.  Inability to achieve or maintain an erection (impotence).  Impairment of your thought process (cognitive dysfunction). There are three types of sleep apnea: 1. Obstructive sleep apnea--Pauses in breathing during sleep because of a blocked airway. 2. Central sleep apnea--Pauses in breathing during sleep because the area of the brain that controls your breathing does not send the correct signals  to the muscles that control breathing. 3. Mixed sleep apnea--A combination of both obstructive and central sleep apnea. RISK FACTORS The following risk factors can increase your risk of developing sleep apnea:  Being overweight.  Smoking.  Having narrow passages in your nose and throat.  Being of older age.  Being female.  Alcohol use.  Sedative and tranquilizer use.  Ethnicity. Among individuals younger than 35 years, African Americans are at increased risk of sleep apnea. SYMPTOMS   Difficulty staying asleep.  Daytime sleepiness and fatigue.  Loss of energy.  Irritability.  Loud, heavy snoring.  Morning headaches.  Trouble concentrating.  Forgetfulness.  Decreased interest in sex. DIAGNOSIS  In order to diagnose sleep apnea, your caregiver will perform a physical examination. Your caregiver may suggest that you take a home sleep test. Your caregiver may also recommend that you spend the night in a sleep lab. In the sleep lab, several monitors record information about your heart, lungs, and brain while you sleep. Your leg and arm movements and blood oxygen level are also recorded. TREATMENT The following actions may help to resolve mild sleep apnea:  Sleeping on your side.   Using a decongestant if you have nasal congestion.   Avoiding the use of depressants, including alcohol, sedatives, and narcotics.   Losing weight and modifying your diet if you are overweight. There also are devices and treatments to help open your airway:  Oral appliances. These are custom-made mouthpieces that shift your lower jaw forward and slightly open your bite. This opens your airway.  Devices that create positive airway pressure. This positive pressure "  splints" your airway open to help you breathe better during sleep. The following devices create positive airway pressure:  Continuous positive airway pressure (CPAP) device. The CPAP device creates a continuous level of air  pressure with an air pump. The air is delivered to your airway through a mask while you sleep. This continuous pressure keeps your airway open.  Nasal expiratory positive airway pressure (EPAP) device. The EPAP device creates positive air pressure as you exhale. The device consists of single-use valves, which are inserted into each nostril and held in place by adhesive. The valves create very little resistance when you inhale but create much more resistance when you exhale. That increased resistance creates the positive airway pressure. This positive pressure while you exhale keeps your airway open, making it easier to breath when you inhale again.  Bilevel positive airway pressure (BPAP) device. The BPAP device is used mainly in patients with central sleep apnea. This device is similar to the CPAP device because it also uses an air pump to deliver continuous air pressure through a mask. However, with the BPAP machine, the pressure is set at two different levels. The pressure when you exhale is lower than the pressure when you inhale.  Surgery. Typically, surgery is only done if you cannot comply with less invasive treatments or if the less invasive treatments do not improve your condition. Surgery involves removing excess tissue in your airway to create a wider passage way. Document Released: 11/17/2002 Document Revised: 03/24/2013 Document Reviewed: 04/04/2012 [facility] Patient Information 2015 [place], [place]. This information is not intended to replace advice given to you by your health care provider. Make sure you discuss any questions you have with your health care provider.

## 2014-10-21 NOTE — Assessment & Plan Note (Signed)
Seems to have some increased sinus pain, pressure, and congestion, initially improved following URI now persistent symptoms >2 weeks, consistent with acute-subacute rhino- sinusitis - No focal lung findings, afebrile, stable vitals  Plan: 1. Treat with Azithromycin Z-pak x 5 days, given PCN allergy 2. RTC 1-2 weeks if no improvement

## 2014-10-21 NOTE — Progress Notes (Signed)
   Subjective:    Patient ID: Nicole [name], female    DOB: December 26, 1975, 38 y.o.   MRN: [id number]  Patient presents for a same day appointment.  HPI  DYSPNEA / FATIGUE: - Reports overall "feels so tired", not feeling rested after waking up from sleep, chronic symptoms x past 6 months, worsening in past 2 weeks. Husband states wakes her up about 4-5. Has fallen asleep sometimes during day recently, starting to affect her at work. - No prior sleep study, never seen a Pulmonologist. - Also admits to generalized body aches for past 2 weeks, recently associated with URI, which seems to have initially improved, now slightly worse with persistent HA, congestion. - Currently taking allegra, ibuprofen 600mg  twice daily, cough syrup - Admits to some shortness of breath on significant exertion (none at rest), nasal congestion, headaches and sinus pressure, resolved cough - Denies chest pain or pressure, lower ext swelling or pain, productive cough, n/v  I have reviewed and updated the following as appropriate: allergies and current medications  Social Hx: - Former smoker  Review of Systems  See above HPI    Objective:   Physical Exam  BP 122/86 mmHg  Pulse 86  Temp(Src) 98.2 F (36.8 C) (Oral)  Ht 5\' 5"  (1.651 m)  Wt 246 lb 12.8 oz (111.948 kg)  BMI 41.07 kg/m2  Gen - obese, tired but well-appearing, NAD HEENT - NCAT, bilateral frontal and maxillary (R>L) sinuses mildly tender to palpation, PERRL, EOMI, nasal turbinates edematous w/o congestion, oropharynx clear, Mallampati Grade III, MMM Neck - supple, mild tenderness without LAD Heart - RRR, no murmurs heard Lungs - CTAB, no wheezing, crackles, or rhonchi. Normal work of breathing. Speaks full sentences. Ext - non-tender calves, no erythema or asymmetry, no significant pitting edema, peripheral pulses intact +2 b/l Skin - warm, dry, no rashes Neuro - awake, alert       Assessment & Plan:   See specific A&P problem list for  details.

## 2014-10-25 ENCOUNTER — Telehealth: Payer: Self-pay | Admitting: Family Medicine

## 2014-10-25 MED ORDER — IPRATROPIUM BROMIDE 0.06 % NA SOLN: 2.0000 | Freq: Four times a day (QID) | NASAL | Status: DC

## 2014-10-25 NOTE — Telephone Encounter (Signed)
Patient calls due to symptoms not improving since office visit 11/11. She was prescribed azithromycin and still has nasal congestion making it hard to sleep and body aches. Nasal spray (saline) is not helping. Today she felt lightheaded though BP was 119/85 and though she is eating /drinking okay. Denies nausea/emesis. Denies dyspnea.   Recommended that if she has trouble breathing or other concerns overnight, that would be appropriate to be seen in the ED. Stated without exam I would not be able to rx antibiotics or sleep aid which she was requesting. Suggested ibuprofen and hot bath for pain and nasal saline along with afrin 2-3 days max for nasal congestion. Also sent in atrovent nasal spray as another option for patient. If she decided not to be seen in the ED, suggested she call and make sameday appointment in the AM.  Pt voiced understanding.  Nicole Sinclair, MD PGY-3, [place]

## 2014-10-27 ENCOUNTER — Emergency Department (HOSPITAL_COMMUNITY)
Admission: EM | Admit: 2014-10-27 | Discharge: 2014-10-28 | Disposition: A | Discharge status: Home / Self Care | Payer: No Typology Code available for payment source | Attending: Emergency Medicine | Admitting: Emergency Medicine

## 2014-10-27 ENCOUNTER — Telehealth: Payer: Self-pay | Admitting: Family Medicine

## 2014-10-27 ENCOUNTER — Encounter (HOSPITAL_COMMUNITY): Payer: Self-pay | Admitting: Emergency Medicine

## 2014-10-27 DIAGNOSIS — Z87891 Personal history of nicotine dependence: Secondary | ICD-10-CM | POA: Diagnosis not present

## 2014-10-27 DIAGNOSIS — D649 Anemia, unspecified: Secondary | ICD-10-CM | POA: Diagnosis not present

## 2014-10-27 DIAGNOSIS — M791 Myalgia, unspecified site: Secondary | ICD-10-CM

## 2014-10-27 DIAGNOSIS — Z791 Long term (current) use of non-steroidal anti-inflammatories (NSAID): Secondary | ICD-10-CM | POA: Diagnosis not present

## 2014-10-27 DIAGNOSIS — Z8742 Personal history of other diseases of the female genital tract: Secondary | ICD-10-CM | POA: Diagnosis not present

## 2014-10-27 DIAGNOSIS — Z792 Long term (current) use of antibiotics: Secondary | ICD-10-CM | POA: Insufficient documentation

## 2014-10-27 DIAGNOSIS — R059 Cough, unspecified: Secondary | ICD-10-CM

## 2014-10-27 DIAGNOSIS — R05 Cough: Secondary | ICD-10-CM

## 2014-10-27 DIAGNOSIS — Z880 Allergy status to penicillin: Secondary | ICD-10-CM | POA: Diagnosis not present

## 2014-10-27 DIAGNOSIS — J45909 Unspecified asthma, uncomplicated: Secondary | ICD-10-CM | POA: Diagnosis not present

## 2014-10-27 DIAGNOSIS — J069 Acute upper respiratory infection, unspecified: Secondary | ICD-10-CM | POA: Diagnosis not present

## 2014-10-27 DIAGNOSIS — Z79899 Other long term (current) drug therapy: Secondary | ICD-10-CM | POA: Insufficient documentation

## 2014-10-27 DIAGNOSIS — Z8679 Personal history of other diseases of the circulatory system: Secondary | ICD-10-CM | POA: Diagnosis not present

## 2014-10-27 DIAGNOSIS — Z3202 Encounter for pregnancy test, result negative: Secondary | ICD-10-CM | POA: Diagnosis not present

## 2014-10-27 DIAGNOSIS — G40909 Epilepsy, unspecified, not intractable, without status epilepticus: Secondary | ICD-10-CM | POA: Insufficient documentation

## 2014-10-27 DIAGNOSIS — Z8719 Personal history of other diseases of the digestive system: Secondary | ICD-10-CM | POA: Insufficient documentation

## 2014-10-27 NOTE — Telephone Encounter (Signed)
After hours line  Pt calling in stating that she has been sick for several days. She started antibiotics several days ago but seems to be feeling worse. She has sinus pressure, weakness, and has developed weakness, L sided shaking, and abd pain over the last day or so. She states  That she is supposed to go to work tonight in 1 hour but that she clearly cant b/c she is so sick and she may be contagious.   She denies dyspnea and PO intolerance. I advised her that she sounds stable to be seen in the am in the clinic but that if she feel she needs to be seen tonight it would have to be the ED. The patient sounds desperate for help and states that she will seek help in the ED.   Laroy Apple, MD [place] Resident, PGY-3 10/27/2014, 10:11 PM

## 2014-10-27 NOTE — ED Notes (Signed)
Pt states that she has had an URI with abdominal pain, generalized bady aches, and n/v since 11/11. Pt states she was seen at her PCP on 11/11 and was given a z-pac. Pt states she has been using phenergan to control her nausea.

## 2014-10-28 ENCOUNTER — Emergency Department (HOSPITAL_COMMUNITY): Payer: No Typology Code available for payment source

## 2014-10-28 LAB — COMPREHENSIVE METABOLIC PANEL
ALT: 22 U/L (ref 0–35)
ANION GAP: 14 (ref 5–15)
AST: 22 U/L (ref 0–37)
Albumin: 3.6 g/dL (ref 3.5–5.2)
Alkaline Phosphatase: 82 U/L (ref 39–117)
BUN: 8 mg/dL (ref 6–23)
CHLORIDE: 104 meq/L (ref 96–112)
CO2: 21 meq/L (ref 19–32)
CREATININE: 0.73 mg/dL (ref 0.50–1.10)
Calcium: 9.1 mg/dL (ref 8.4–10.5)
GFR calc Af Amer: >90 mL/min (ref >90)
Glucose, Bld: 98 mg/dL (ref 70–99)
Potassium: 3.8 mEq/L (ref 3.7–5.3)
Sodium: 139 mEq/L (ref 137–147)
Total Protein: 7.1 g/dL (ref 6.0–8.3)

## 2014-10-28 LAB — CBC WITH DIFFERENTIAL/PLATELET
BASOS PCT: 1 % (ref 0–1)
Basophils Absolute: 0.1 10*3/uL (ref 0.0–0.1)
EOS ABS: 0.5 10*3/uL (ref 0.0–0.7)
EOS PCT: 7 % — AB (ref 0–5)
HCT: 35.5 % — ABNORMAL LOW (ref 36.0–46.0)
HEMOGLOBIN: 12.2 g/dL (ref 12.0–15.0)
LYMPHS ABS: 3.2 10*3/uL (ref 0.7–4.0)
Lymphocytes Relative: 48 % — ABNORMAL HIGH (ref 12–46)
MCH: 29.5 pg (ref 26.0–34.0)
MCHC: 34.4 g/dL (ref 30.0–36.0)
MCV: 85.7 fL (ref 78.0–100.0)
MONO ABS: 0.4 10*3/uL (ref 0.1–1.0)
MONOS PCT: 7 % (ref 3–12)
Neutro Abs: 2.5 10*3/uL (ref 1.7–7.7)
Neutrophils Relative %: 37 % — ABNORMAL LOW (ref 43–77)
Platelets: 249 10*3/uL (ref 150–400)
RBC: 4.14 MIL/uL (ref 3.87–5.11)
RDW: 13.1 % (ref 11.5–15.5)
WBC: 6.7 10*3/uL (ref 4.0–10.5)

## 2014-10-28 LAB — URINALYSIS, ROUTINE W REFLEX MICROSCOPIC
BILIRUBIN URINE: NEGATIVE
GLUCOSE, UA: NEGATIVE mg/dL
HGB URINE DIPSTICK: NEGATIVE
KETONES UR: NEGATIVE mg/dL
Leukocytes, UA: NEGATIVE
Nitrite: NEGATIVE
PROTEIN: NEGATIVE mg/dL
Specific Gravity, Urine: 1.01 (ref 1.005–1.030)
UROBILINOGEN UA: 0.2 mg/dL (ref 0.0–1.0)
pH: 6.5 (ref 5.0–8.0)

## 2014-10-28 LAB — PREGNANCY, URINE: Preg Test, Ur: NEGATIVE

## 2014-10-28 LAB — LIPASE, BLOOD: LIPASE: 73 U/L — AB (ref 11–59)

## 2014-10-28 MED ORDER — MORPHINE SULFATE 4 MG/ML IJ SOLN
4.0000 mg | Freq: Once | INTRAMUSCULAR | Status: AC
  Administered 2014-10-28: 4 mg via INTRAVENOUS
  Filled 2014-10-28: qty 1

## 2014-10-28 MED ORDER — ONDANSETRON HCL 4 MG/2ML IJ SOLN
4.0000 mg | Freq: Once | INTRAMUSCULAR | Status: AC
  Administered 2014-10-28: 4 mg via INTRAVENOUS
  Filled 2014-10-28: qty 2

## 2014-10-28 MED ORDER — SODIUM CHLORIDE 0.9 % IV SOLN
1000.0000 mL | INTRAVENOUS | Status: DC
  Administered 2014-10-28: 1000 mL via INTRAVENOUS

## 2014-10-28 MED ORDER — SODIUM CHLORIDE 0.9 % IV SOLN
1000.0000 mL | Freq: Once | INTRAVENOUS | Status: AC
  Administered 2014-10-28: 1000 mL via INTRAVENOUS

## 2014-10-28 MED ORDER — ONDANSETRON 8 MG PO TBDP: 8.0000 mg | ORAL_TABLET | Freq: Three times a day (TID) | ORAL | Status: DC | PRN

## 2014-10-28 NOTE — ED Provider Notes (Signed)
CSN: [id number]     Arrival date & time 10/27/14  2318 History   First MD Initiated Contact with Patient 10/28/14 0033     Chief Complaint  Patient presents with  . Generalized Body Aches  . URI      HPI Patient presents with ongoing nasal congestion upper respiratory symptoms as well as ongoing cough over the past week.  She's also developed nausea vomiting without diarrhea.  She denies significant abdominal pain.  She was given azithromycin without improvement in her symptoms when seen in the clinic with her primary care physician.  She been using the Phenergan to help control her nausea.  No urinary complaints.  No back pain or flank pain.  No chest pain.   Past Medical History  Diagnosis Date  . Anemia   . Asthma   . Blood transfusion 2011    r/t anemia  . Migraine   . Sickle cell trait   . Seizures     last sz Jan 2012  . Seizures   . Fibroid   . Tooth abscess   . Sickle cell trait   . Migraine    Past Surgical History  Procedure Laterality Date  . Tubal ligation Bilateral 2005   Family History  Problem Relation Age of Onset  . Anesthesia problems Neg Hx   . Hypertension Mother   . Diabetes Mother   . Cancer Mother     Colon, brain, 2 other  . Asthma Mother   . Clotting disorder Mother   . Heart disease Mother     28, stents  . Stroke Mother     2012  . Diabetes Father   . Asthma Father   . Clotting disorder Father   . Sickle cell anemia Father   . Heart disease Father     2000, stents  . Hypertension Father   . Stroke Father     2007  . Diabetes Maternal Grandmother   . Asthma Sister   . Asthma Brother   . Asthma Paternal Grandmother    History  Substance Use Topics  . Smoking status: Former Smoker    Quit date: 03/27/2014  . Smokeless tobacco: Never Used  . Alcohol Use: No   OB History    Gravida Para Term Preterm AB TAB SAB Ectopic Multiple Living   3 2 1 1 1  0 0 1 0 2     Review of Systems  All other systems reviewed and are  negative.     Allergies  Penicillins and Sulfa antibiotics  Home Medications   Prior to Admission medications   Medication Sig Start Date End Date Taking? Authorizing Provider  colchicine 0.6 MG tablet Take 0.6 mg by mouth 2 (two) times daily. 01/20/14  Yes Dorothy Spark, MD  diphenhydrAMINE (BENADRYL) 25 mg capsule Take 25 mg by mouth every 4 (four) hours as needed for allergies.   Yes Historical Provider, MD  fexofenadine (ALLEGRA) 180 MG tablet Take 1 tablet (180 mg total) by mouth daily. 08/13/14  Yes Leone Brand, MD  ibuprofen (ADVIL,MOTRIN) 200 MG tablet Take 600 mg by mouth every 6 (six) hours as needed for moderate pain.    Yes Historical Provider, MD  indomethacin (INDOCIN) 50 MG capsule Take 1 capsule (50 mg total) by mouth 3 (three) times daily with meals. 01/20/14  Yes Dorothy Spark, MD  ipratropium (ATROVENT) 0.06 % nasal spray Place 2 sprays into both nostrils 4 (four) times daily. Patient taking differently: Place 2  sprays into both nostrils 4 (four) times daily as needed for rhinitis.  10/25/14  Yes Hilton Sinclair, MD  levETIRAcetam (KEPPRA) 500 MG tablet Take 1 tablet (500 mg total) by mouth 2 (two) times daily. 06/04/14  Yes Kalman Drape, MD  medroxyPROGESTERone (DEPO-PROVERA) 150 MG/ML injection Inject 150 mg into the muscle every 3 (three) months. Inject on  the 22nd of each month 02/11/14  Yes Shelly Bombard, MD  omeprazole (PRILOSEC) 20 MG capsule Take 1 capsule (20 mg total) by mouth 2 (two) times daily before a meal. 01/28/14  Yes Shelly Bombard, MD  Prenatal Vit-Fe Fumarate-FA (PNV PRENATAL PLUS MULTIVITAMIN) 27-1 MG TABS Take 1 tablet by mouth daily. 07/22/14  Yes Historical Provider, MD  azithromycin (ZITHROMAX Z-PAK) 250 MG tablet Take 2 tablets on Day 1. Take 1 tablet daily for 4 days. 10/21/14   Nobie Putnam, DO  HYDROcodone-homatropine (HYCODAN) 5-1.5 MG/5ML syrup Take 5 mLs by mouth every 8 (eight) hours as needed for cough. Patient not  taking: Reported on 10/27/2014 10/09/14   Timmothy Euler, MD  ibuprofen (ADVIL,MOTRIN) 600 MG tablet Take 1 tablet (600 mg total) by mouth every 6 (six) hours as needed. Patient not taking: Reported on 10/27/2014 09/18/14   Varney Biles, MD  indomethacin (INDOCIN) 25 MG capsule Take 1 capsule (25 mg total) by mouth 3 (three) times daily with meals. May take up to 50mg  three times a day if no improvement with 25mg . Patient not taking: Reported on 10/27/2014 08/02/14   Alvina Chou, PA-C  oxyCODONE-acetaminophen (PERCOCET/ROXICET) 5-325 MG per tablet Take 1 tablet by mouth every 8 (eight) hours as needed for severe pain. 09/04/14   Frazier Richards, MD   BP 91/66 mmHg  Pulse 62  Temp(Src) 97.6 F (36.4 C) (Oral)  Resp 18  SpO2 98% Physical Exam  Constitutional: She is oriented to person, place, and time. She appears well-developed and well-nourished. No distress.  HENT:  Head: Normocephalic and atraumatic.  Eyes: EOM are normal.  Neck: Normal range of motion.  Cardiovascular: Normal rate, regular rhythm and normal heart sounds.   Pulmonary/Chest: Effort normal and breath sounds normal.  Abdominal: Soft. She exhibits no distension. There is no tenderness. There is no rebound and no guarding.  Musculoskeletal: Normal range of motion.  Neurological: She is alert and oriented to person, place, and time.  Skin: Skin is warm and dry.  Psychiatric: She has a normal mood and affect. Judgment normal.  Nursing note and vitals reviewed.   ED Course  Procedures (including critical care time) Labs Review Labs Reviewed  CBC WITH DIFFERENTIAL - Abnormal; Notable for the following:    HCT 35.5 (*)    Neutrophils Relative % 37 (*)    Lymphocytes Relative 48 (*)    Eosinophils Relative 7 (*)    All other components within normal limits  COMPREHENSIVE METABOLIC PANEL - Abnormal; Notable for the following:    Total Bilirubin <0.2 (*)    All other components within normal limits  LIPASE, BLOOD  - Abnormal; Notable for the following:    Lipase 73 (*)    All other components within normal limits  URINALYSIS, ROUTINE W REFLEX MICROSCOPIC  PREGNANCY, URINE    Imaging Review Dg Chest 2 View  10/28/2014   CLINICAL DATA:  Cough. Shortness of breath. Mid chest pain for 1 week, worsening over the past few days. History of asthma. Previous smoker.  EXAM: CHEST  2 VIEW  COMPARISON:  08/02/2014  FINDINGS: The  heart size and mediastinal contours are within normal limits. Both lungs are clear. The visualized skeletal structures are unremarkable.  IMPRESSION: No active cardiopulmonary disease.   Electronically Signed   By: Lucienne Capers M.D.   On: 10/28/2014 01:18  I personally reviewed the imaging tests through PACS system I reviewed available ER/hospitalization records through the EMR    EKG Interpretation None      MDM   Final diagnoses:  Cough  Viral upper respiratory illness  Myalgia    Mild volume depletion.  Improved after IV fluids.  Myalgias improved.  Suspect viral process.  No indication for advanced imaging.  Discharge home in good condition.    Hoy Morn, MD 10/28/14 [phone]

## 2014-10-28 NOTE — Discharge Instructions (Signed)

## 2014-11-09 ENCOUNTER — Emergency Department (HOSPITAL_COMMUNITY): Payer: No Typology Code available for payment source

## 2014-11-09 ENCOUNTER — Encounter (HOSPITAL_COMMUNITY): Payer: Self-pay | Admitting: *Deleted

## 2014-11-09 DIAGNOSIS — J018 Other acute sinusitis: Secondary | ICD-10-CM | POA: Insufficient documentation

## 2014-11-09 DIAGNOSIS — J45901 Unspecified asthma with (acute) exacerbation: Secondary | ICD-10-CM | POA: Diagnosis not present

## 2014-11-09 DIAGNOSIS — Z8742 Personal history of other diseases of the female genital tract: Secondary | ICD-10-CM | POA: Insufficient documentation

## 2014-11-09 DIAGNOSIS — Z791 Long term (current) use of non-steroidal anti-inflammatories (NSAID): Secondary | ICD-10-CM | POA: Insufficient documentation

## 2014-11-09 DIAGNOSIS — Z79899 Other long term (current) drug therapy: Secondary | ICD-10-CM | POA: Diagnosis not present

## 2014-11-09 DIAGNOSIS — Z8719 Personal history of other diseases of the digestive system: Secondary | ICD-10-CM | POA: Diagnosis not present

## 2014-11-09 DIAGNOSIS — G40909 Epilepsy, unspecified, not intractable, without status epilepticus: Secondary | ICD-10-CM | POA: Diagnosis not present

## 2014-11-09 DIAGNOSIS — Z87891 Personal history of nicotine dependence: Secondary | ICD-10-CM | POA: Diagnosis not present

## 2014-11-09 DIAGNOSIS — Z792 Long term (current) use of antibiotics: Secondary | ICD-10-CM | POA: Diagnosis not present

## 2014-11-09 DIAGNOSIS — R05 Cough: Secondary | ICD-10-CM | POA: Diagnosis present

## 2014-11-09 DIAGNOSIS — Z8679 Personal history of other diseases of the circulatory system: Secondary | ICD-10-CM | POA: Insufficient documentation

## 2014-11-09 DIAGNOSIS — D649 Anemia, unspecified: Secondary | ICD-10-CM | POA: Insufficient documentation

## 2014-11-09 LAB — I-STAT TROPONIN, ED: TROPONIN I, POC: 0 ng/mL (ref 0.00–0.08)

## 2014-11-09 NOTE — ED Notes (Signed)
Pt in c/o cough and congestion, chest pain, chills, abd pain, n/v, seen for same and dx with a virus, states symptoms have continued

## 2014-11-10 ENCOUNTER — Emergency Department (HOSPITAL_COMMUNITY)
Admission: EM | Admit: 2014-11-10 | Discharge: 2014-11-10 | Disposition: A | Discharge status: Home / Self Care | Payer: No Typology Code available for payment source | Attending: Emergency Medicine | Admitting: Emergency Medicine

## 2014-11-10 DIAGNOSIS — R059 Cough, unspecified: Secondary | ICD-10-CM

## 2014-11-10 DIAGNOSIS — R05 Cough: Secondary | ICD-10-CM

## 2014-11-10 DIAGNOSIS — J018 Other acute sinusitis: Secondary | ICD-10-CM

## 2014-11-10 LAB — BASIC METABOLIC PANEL
Anion gap: 16 — ABNORMAL HIGH (ref 5–15)
BUN: 8 mg/dL (ref 6–23)
CO2: 19 mEq/L (ref 19–32)
Calcium: 8.9 mg/dL (ref 8.4–10.5)
Chloride: 104 mEq/L (ref 96–112)
Creatinine, Ser: 0.72 mg/dL (ref 0.50–1.10)
GFR calc Af Amer: >90 mL/min (ref >90)
Glucose, Bld: 117 mg/dL — ABNORMAL HIGH (ref 70–99)
POTASSIUM: 3.6 meq/L — AB (ref 3.7–5.3)
SODIUM: 139 meq/L (ref 137–147)

## 2014-11-10 LAB — CBC
HCT: 33.6 % — ABNORMAL LOW (ref 36.0–46.0)
Hemoglobin: 11.4 g/dL — ABNORMAL LOW (ref 12.0–15.0)
MCH: 28.5 pg (ref 26.0–34.0)
MCHC: 33.9 g/dL (ref 30.0–36.0)
MCV: 84 fL (ref 78.0–100.0)
Platelets: 255 10*3/uL (ref 150–400)
RBC: 4 MIL/uL (ref 3.87–5.11)
RDW: 13 % (ref 11.5–15.5)
WBC: 6 10*3/uL (ref 4.0–10.5)

## 2014-11-10 MED ORDER — LEVOFLOXACIN 500 MG PO TABS: 500.0000 mg | ORAL_TABLET | Freq: Every day | ORAL | Status: DC

## 2014-11-10 MED ORDER — PREDNISONE 50 MG PO TABS: 50.0000 mg | ORAL_TABLET | Freq: Every day | ORAL | Status: DC

## 2014-11-10 MED ORDER — LEVOFLOXACIN 500 MG PO TABS
500.0000 mg | ORAL_TABLET | Freq: Once | ORAL | Status: AC
  Administered 2014-11-10: 500 mg via ORAL
  Filled 2014-11-10: qty 1

## 2014-11-10 MED ORDER — ACETAMINOPHEN 325 MG PO TABS
650.0000 mg | ORAL_TABLET | Freq: Once | ORAL | Status: AC
  Administered 2014-11-10: 650 mg via ORAL
  Filled 2014-11-10: qty 2

## 2014-11-10 MED ORDER — PREDNISONE 20 MG PO TABS
60.0000 mg | ORAL_TABLET | Freq: Once | ORAL | Status: AC
  Administered 2014-11-10: 60 mg via ORAL
  Filled 2014-11-10: qty 3

## 2014-11-10 MED ORDER — ALBUTEROL SULFATE HFA 108 (90 BASE) MCG/ACT IN AERS: 2.0000 | INHALATION_SPRAY | RESPIRATORY_TRACT | Status: DC | PRN

## 2014-11-10 MED ORDER — IPRATROPIUM-ALBUTEROL 0.5-2.5 (3) MG/3ML IN SOLN
3.0000 mL | Freq: Once | RESPIRATORY_TRACT | Status: AC
  Administered 2014-11-10: 3 mL via RESPIRATORY_TRACT
  Filled 2014-11-10: qty 3

## 2014-11-10 NOTE — Discharge Instructions (Signed)
Cough, Adult  A cough is a reflex that helps clear your throat and airways. It can help heal the body or may be a reaction to an irritated airway. A cough may only last 2 or 3 weeks (acute) or may last more than 8 weeks (chronic).  CAUSES Acute cough:  Viral or bacterial infections. Chronic cough:  Infections.  Allergies.  Asthma.  Post-nasal drip.  Smoking.  Heartburn or acid reflux.  Some medicines.  Chronic lung problems (COPD).  Cancer. SYMPTOMS   Cough.  Fever.  Chest pain.  Increased breathing rate.  High-pitched whistling sound when breathing (wheezing).  Colored mucus that you cough up (sputum). TREATMENT   A bacterial cough may be treated with antibiotic medicine.  A viral cough must run its course and will not respond to antibiotics.  Your caregiver may recommend other treatments if you have a chronic cough. HOME CARE INSTRUCTIONS   Only take over-the-counter or prescription medicines for pain, discomfort, or fever as directed by your caregiver. Use cough suppressants only as directed by your caregiver.  Use a cold steam vaporizer or humidifier in your bedroom or home to help loosen secretions.  Sleep in a semi-upright position if your cough is worse at night.  Rest as needed.  Stop smoking if you smoke. SEEK IMMEDIATE MEDICAL CARE IF:   You have pus in your sputum.  Your cough starts to worsen.  You cannot control your cough with suppressants and are losing sleep.  You begin coughing up blood.  You have difficulty breathing.  You develop pain which is getting worse or is uncontrolled with medicine.  You have a fever. MAKE SURE YOU:   Understand these instructions.  Will watch your condition.  Will get help right away if you are not doing well or get worse. Document Released: 05/26/2011 Document Revised: 02/19/2012 Document Reviewed: 05/26/2011 [employer] Patient Information 2015 [employer]. This information is not intended  to replace advice given to you by your health care provider. Make sure you discuss any questions you have with your health care provider. Sinusitis Sinusitis is redness, soreness, and inflammation of the paranasal sinuses. Paranasal sinuses are air pockets within the bones of your face (beneath the eyes, the middle of the forehead, or above the eyes). In healthy paranasal sinuses, mucus is able to drain out, and air is able to circulate through them by way of your nose. However, when your paranasal sinuses are inflamed, mucus and air can become trapped. This can allow bacteria and other germs to grow and cause infection. Sinusitis can develop quickly and last only a short time (acute) or continue over a long period (chronic). Sinusitis that lasts for more than 12 weeks is considered chronic.  CAUSES  Causes of sinusitis include:  Allergies.  Structural abnormalities, such as displacement of the cartilage that separates your nostrils (deviated septum), which can decrease the air flow through your nose and sinuses and affect sinus drainage.  Functional abnormalities, such as when the small hairs (cilia) that line your sinuses and help remove mucus do not work properly or are not present. SIGNS AND SYMPTOMS  Symptoms of acute and chronic sinusitis are the same. The primary symptoms are pain and pressure around the affected sinuses. Other symptoms include:  Upper toothache.  Earache.  Headache.  Bad breath.  Decreased sense of smell and taste.  A cough, which worsens when you are lying flat.  Fatigue.  Fever.  Thick drainage from your nose, which often is green and   and may contain pus (purulent).  Swelling and warmth over the affected sinuses. DIAGNOSIS  Your health care provider will perform a physical exam. During the exam, your health care provider may:  Look in your nose for signs of abnormal growths in your nostrils (nasal polyps).  Tap over the affected sinus to check for signs  of infection.  View the inside of your sinuses (endoscopy) using an imaging device that has a light attached (endoscope). If your health care provider suspects that you have chronic sinusitis, one or more of the following tests may be recommended:  Allergy tests.  Nasal culture. A sample of mucus is taken from your nose, sent to a lab, and screened for bacteria.  Nasal cytology. A sample of mucus is taken from your nose and examined by your health care provider to determine if your sinusitis is related to an allergy. TREATMENT  Most cases of acute sinusitis are related to a viral infection and will resolve on their own within 10 days. Sometimes medicines are prescribed to help relieve symptoms (pain medicine, decongestants, nasal steroid sprays, or saline sprays).  However, for sinusitis related to a bacterial infection, your health care provider will prescribe antibiotic medicines. These are medicines that will help kill the bacteria causing the infection.  Rarely, sinusitis is caused by a fungal infection. In theses cases, your health care provider will prescribe antifungal medicine. For some cases of chronic sinusitis, surgery is needed. Generally, these are cases in which sinusitis recurs more than 3 times per year, despite other treatments. HOME CARE INSTRUCTIONS   Drink plenty of water. Water helps thin the mucus so your sinuses can drain more easily.  Use a humidifier.  Inhale steam 3 to 4 times a day (for example, sit in the bathroom with the shower running).  Apply a warm, moist washcloth to your face 3 to 4 times a day, or as directed by your health care provider.  Use saline nasal sprays to help moisten and clean your sinuses.  Take medicines only as directed by your health care provider.  If you were prescribed either an antibiotic or antifungal medicine, finish it all even if you start to feel better. SEEK IMMEDIATE MEDICAL CARE IF:  You have increasing pain or severe  headaches.  You have nausea, vomiting, or drowsiness.  You have swelling around your face.  You have vision problems.  You have a stiff neck.  You have difficulty breathing. MAKE SURE YOU:   Understand these instructions.  Will watch your condition.  Will get help right away if you are not doing well or get worse. Document Released: 11/27/2005 Document Revised: 04/13/2014 Document Reviewed: 12/12/2011 [facility] Patient Information 2015 [place], [place]. This information is not intended to replace advice given to you by your health care provider. Make sure you discuss any questions you have with your health care provider.  Levofloxacin tablets What is this medicine? LEVOFLOXACIN (lee voe FLOX a sin) is a quinolone antibiotic. It is used to treat certain kinds of bacterial infections. It will not work for colds, flu, or other viral infections. This medicine may be used for other purposes; ask your health care provider or pharmacist if you have questions. COMMON BRAND NAME(S): Levaquin, Levaquin Leva-Pak What should I tell my health care provider before I take this medicine? They need to know if you have any of these conditions: -cerebral disease -irregular heartbeat -kidney disease -seizure disorder -an unusual or allergic reaction to levofloxacin, other antibiotics or medicines, foods, dyes,  or preservatives -pregnant or trying to get pregnant -breast-feeding How should I use this medicine? Take this medicine by mouth with a full glass of water. Follow the directions on the prescription label. This medicine can be taken with or without food. Take your medicine at regular intervals. Do not take your medicine more often than directed. Do not skip doses or stop your medicine early even if you feel better. Do not stop taking except on your doctor's advice. A special MedGuide will be given to you by the pharmacist with each prescription and refill. Be sure to read this information  carefully each time. Talk to your pediatrician regarding the use of this medicine in children. While this drug may be prescribed for children as young as 6 months for selected conditions, precautions do apply. Overdosage: If you think you have taken too much of this medicine contact a poison control center or emergency room at once. NOTE: This medicine is only for you. Do not share this medicine with others. What if I miss a dose? If you miss a dose, take it as soon as you remember. If it is almost time for your next dose, take only that dose. Do not take double or extra doses. What may interact with this medicine? Do not take this medicine with any of the following medications: -arsenic trioxide -chloroquine -droperidol -medicines for irregular heart rhythm like amiodarone, disopyramide, dofetilide, flecainide, quinidine, procainamide, sotalol -some medicines for depression or mental problems like phenothiazines, pimozide, and ziprasidone This medicine may also interact with the following medications: -amoxapine -antacids -birth control pills -cisapride -dairy products -didanosine (ddI) buffered tablets or powder -haloperidol -multivitamins -NSAIDS, medicines for pain and inflammation, like ibuprofen or naproxen -retinoid products like tretinoin or isotretinoin -risperidone -some other antibiotics like clarithromycin or erythromycin -sucralfate -theophylline -warfarin This list may not describe all possible interactions. Give your health care provider a list of all the medicines, herbs, non-prescription drugs, or dietary supplements you use. Also tell them if you smoke, drink alcohol, or use illegal drugs. Some items may interact with your medicine. What should I watch for while using this medicine? Tell your doctor or health care professional if your symptoms do not improve or if they get worse. Drink several glasses of water a day and cut down on drinks that contain caffeine. You  must not get dehydrated while taking this medicine. You may get drowsy or dizzy. Do not drive, use machinery, or do anything that needs mental alertness until you know how this medicine affects you. Do not sit or stand up quickly, especially if you are an older patient. This reduces the risk of dizzy or fainting spells. This medicine can make you more sensitive to the sun. Keep out of the sun. If you cannot avoid being in the sun, wear protective clothing and use a sunscreen. Do not use sun lamps or tanning beds/booths. Contact your doctor if you get a sunburn. If you are a diabetic monitor your blood glucose carefully. If you get an unusual reading stop taking this medicine and call your doctor right away. Do not treat diarrhea with over-the-counter products. Contact your doctor if you have diarrhea that lasts more than 2 days or if the diarrhea is severe and watery. Avoid antacids, calcium, iron, and zinc products for 2 hours before and 2 hours after taking a dose of this medicine. What side effects may I notice from receiving this medicine? Side effects that you should report to your doctor or health care professional  as soon as possible: -allergic reactions like skin rash or hives, swelling of the face, lips, or tongue -changes in vision -confusion, nightmares or hallucinations -difficulty breathing -irregular heartbeat, chest pain -joint, muscle or tendon pain -pain or difficulty passing urine -persistent headache with or without blurred vision -redness, blistering, peeling or loosening of the skin, including inside the mouth -seizures -unusual pain, numbness, tingling, or weakness -vaginal irritation, discharge Side effects that usually do not require medical attention (report to your doctor or health care professional if they continue or are bothersome): -diarrhea -dry mouth -headache -stomach upset, nausea -trouble sleeping This list may not describe all possible side effects. Call  your doctor for medical advice about side effects. You may report side effects to FDA at 1-800-FDA-1088. Where should I keep my medicine? Keep out of the reach of children. Store at room temperature between 15 and 30 degrees C (59 and 86 degrees F). Keep in a tightly closed container. Throw away any unused medicine after the expiration date. NOTE: This sheet is a summary. It may not cover all possible information. If you have questions about this medicine, talk to your doctor, pharmacist, or health care provider.  2015, [employer]. (2013-07-04 07:45:07)  Albuterol inhalation aerosol What is this medicine? ALBUTEROL (al [place]) is a bronchodilator. It helps open up the airways in your lungs to make it easier to breathe. This medicine is used to treat and to prevent bronchospasm. This medicine may be used for other purposes; ask your health care provider or pharmacist if you have questions. COMMON BRAND NAME(S): Proair HFA, Proventil, Proventil HFA, Respirol, Ventolin, Ventolin HFA What should I tell my health care provider before I take this medicine? They need to know if you have any of the following conditions: -diabetes -heart disease or irregular heartbeat -high blood pressure -pheochromocytoma -seizures -thyroid disease -an unusual or allergic reaction to albuterol, levalbuterol, sulfites, other medicines, foods, dyes, or preservatives -pregnant or trying to get pregnant -breast-feeding How should I use this medicine? This medicine is for inhalation through the mouth. Follow the directions on your prescription label. Take your medicine at regular intervals. Do not use more often than directed. Make sure that you are using your inhaler correctly. Ask you doctor or health care provider if you have any questions. Talk to your pediatrician regarding the use of this medicine in children. Special care may be needed. Overdosage: If you think you have taken too much of this  medicine contact a poison control center or emergency room at once. NOTE: This medicine is only for you. Do not share this medicine with others. What if I miss a dose? If you miss a dose, use it as soon as you can. If it is almost time for your next dose, use only that dose. Do not use double or extra doses. What may interact with this medicine? -anti-infectives like chloroquine and pentamidine -caffeine -cisapride -diuretics -medicines for colds -medicines for depression or for emotional or psychotic conditions -medicines for weight loss including some herbal products -methadone -some antibiotics like clarithromycin, erythromycin, levofloxacin, and linezolid -some heart medicines -steroid hormones like dexamethasone, cortisone, hydrocortisone -theophylline -thyroid hormones This list may not describe all possible interactions. Give your health care provider a list of all the medicines, herbs, non-prescription drugs, or dietary supplements you use. Also tell them if you smoke, drink alcohol, or use illegal drugs. Some items may interact with your medicine. What should I watch for while using this medicine? Tell your doctor  or health care professional if your symptoms do not improve. Do not use extra albuterol. If your asthma or bronchitis gets worse while you are using this medicine, call your doctor right away. If your mouth gets dry try chewing sugarless gum or sucking hard candy. Drink water as directed. What side effects may I notice from receiving this medicine? Side effects that you should report to your doctor or health care professional as soon as possible: -allergic reactions like skin rash, itching or hives, swelling of the face, lips, or tongue -breathing problems -chest pain -feeling faint or lightheaded, falls -high blood pressure -irregular heartbeat -fever -muscle cramps or weakness -pain, tingling, numbness in the hands or feet -vomiting Side effects that usually do  not require medical attention (report to your doctor or health care professional if they continue or are bothersome): -cough -difficulty sleeping -headache -nervousness or trembling -stomach upset -stuffy or runny nose -throat irritation -unusual taste This list may not describe all possible side effects. Call your doctor for medical advice about side effects. You may report side effects to FDA at 1-800-FDA-1088. Where should I keep my medicine? Keep out of the reach of children. Store at room temperature between 15 and 30 degrees C (59 and 86 degrees F). The contents are under pressure and may burst when exposed to heat or flame. Do not freeze. This medicine does not work as well if it is too cold. Throw away any unused medicine after the expiration date. Inhalers need to be thrown away after the labeled number of puffs have been used or by the expiration date; whichever comes first. Ventolin HFA should be thrown away 12 months after removing from foil pouch. Check the instructions that come with your medicine. NOTE: This sheet is a summary. It may not cover all possible information. If you have questions about this medicine, talk to your doctor, pharmacist, or health care provider.  2015, [employer]. (2013-05-15 10:57:17)  Prednisone tablets What is this medicine? PREDNISONE (PRED ni sone) is a corticosteroid. It is commonly used to treat inflammation of the skin, joints, lungs, and other organs. Common conditions treated include asthma, allergies, and arthritis. It is also used for other conditions, such as blood disorders and diseases of the adrenal glands. This medicine may be used for other purposes; ask your health care provider or pharmacist if you have questions. COMMON BRAND NAME(S): Deltasone, Predone, Sterapred, Sterapred DS What should I tell my health care provider before I take this medicine? They need to know if you have any of these conditions: -Cushing's  syndrome -diabetes -glaucoma -heart disease -high blood pressure -infection (especially a virus infection such as chickenpox, cold sores, or herpes) -kidney disease -liver disease -mental illness -myasthenia gravis -osteoporosis -seizures -stomach or intestine problems -thyroid disease -an unusual or allergic reaction to lactose, prednisone, other medicines, foods, dyes, or preservatives -pregnant or trying to get pregnant -breast-feeding How should I use this medicine? Take this medicine by mouth with a glass of water. Follow the directions on the prescription label. Take this medicine with food. If you are taking this medicine once a day, take it in the morning. Do not take more medicine than you are told to take. Do not suddenly stop taking your medicine because you may develop a severe reaction. Your doctor will tell you how much medicine to take. If your doctor wants you to stop the medicine, the dose may be slowly lowered over time to avoid any side effects. Talk to your pediatrician  regarding the use of this medicine in children. Special care may be needed. Overdosage: If you think you have taken too much of this medicine contact a poison control center or emergency room at once. NOTE: This medicine is only for you. Do not share this medicine with others. What if I miss a dose? If you miss a dose, take it as soon as you can. If it is almost time for your next dose, talk to your doctor or health care professional. You may need to miss a dose or take an extra dose. Do not take double or extra doses without advice. What may interact with this medicine? Do not take this medicine with any of the following medications: -metyrapone -mifepristone This medicine may also interact with the following medications: -aminoglutethimide -amphotericin B -aspirin and aspirin-like medicines -barbiturates -certain medicines for diabetes, like glipizide or  glyburide -cholestyramine -cholinesterase inhibitors -cyclosporine -digoxin -diuretics -ephedrine -female hormones, like estrogens and birth control pills -isoniazid -ketoconazole -NSAIDS, medicines for pain and inflammation, like ibuprofen or naproxen -phenytoin -rifampin -toxoids -vaccines -warfarin This list may not describe all possible interactions. Give your health care provider a list of all the medicines, herbs, non-prescription drugs, or dietary supplements you use. Also tell them if you smoke, drink alcohol, or use illegal drugs. Some items may interact with your medicine. What should I watch for while using this medicine? Visit your doctor or health care professional for regular checks on your progress. If you are taking this medicine over a prolonged period, carry an identification card with your name and address, the type and dose of your medicine, and your doctor's name and address. This medicine may increase your risk of getting an infection. Tell your doctor or health care professional if you are around anyone with measles or chickenpox, or if you develop sores or blisters that do not heal properly. If you are going to have surgery, tell your doctor or health care professional that you have taken this medicine within the last twelve months. Ask your doctor or health care professional about your diet. You may need to lower the amount of salt you eat. This medicine may affect blood sugar levels. If you have diabetes, check with your doctor or health care professional before you change your diet or the dose of your diabetic medicine. What side effects may I notice from receiving this medicine? Side effects that you should report to your doctor or health care professional as soon as possible: -allergic reactions like skin rash, itching or hives, swelling of the face, lips, or tongue -changes in emotions or moods -changes in vision -depressed mood -eye pain -fever or chills,  cough, sore throat, pain or difficulty passing urine -increased thirst -swelling of ankles, feet Side effects that usually do not require medical attention (report to your doctor or health care professional if they continue or are bothersome): -confusion, excitement, restlessness -headache -nausea, vomiting -skin problems, acne, thin and shiny skin -trouble sleeping -weight gain This list may not describe all possible side effects. Call your doctor for medical advice about side effects. You may report side effects to FDA at 1-800-FDA-1088. Where should I keep my medicine? Keep out of the reach of children. Store at room temperature between 15 and 30 degrees C (59 and 86 degrees F). Protect from light. Keep container tightly closed. Throw away any unused medicine after the expiration date. NOTE: This sheet is a summary. It may not cover all possible information. If you have questions about this medicine,  talk to your doctor, pharmacist, or health care provider.  2015, [employer]. (2011-07-13 10:57:14)

## 2014-11-10 NOTE — ED Provider Notes (Signed)
CSN: [id number]     Arrival date & time 11/09/14  2245 History  This chart was scribed for Delora Fuel, MD by Starleen Arms, ED Scribe. This patient was seen in room A11C/A11C and the patient's care was started at 1:37 AM.   Chief Complaint  Patient presents with  . Cough  . Abdominal Pain   HPI HPI Comments: Nicole [name] is a 38 y.o. female who presents to the Emergency Department complaining of a worsening cough productive of greenish/yellow mucous and congestion consisting of yellow phlegm onset 1 month ago with new associated epigastric abdominal pain and a sore throat aggravated by swallowing.  She also reports SOB after walking distances of 20 yards or more and intermittent chills.  She reports using azithromycin, hycodan, nasal spray, Mucinex DM without relief.  She has been seen twice previously for these complaints.   Past Medical History  Diagnosis Date  . Anemia   . Asthma   . Blood transfusion 2011    r/t anemia  . Migraine   . Sickle cell trait   . Seizures     last sz Jan 2012  . Seizures   . Fibroid   . Tooth abscess   . Sickle cell trait   . Migraine    Past Surgical History  Procedure Laterality Date  . Tubal ligation Bilateral 2005   Family History  Problem Relation Age of Onset  . Anesthesia problems Neg Hx   . Hypertension Mother   . Diabetes Mother   . Cancer Mother     Colon, brain, 2 other  . Asthma Mother   . Clotting disorder Mother   . Heart disease Mother     54, stents  . Stroke Mother     2012  . Diabetes Father   . Asthma Father   . Clotting disorder Father   . Sickle cell anemia Father   . Heart disease Father     2000, stents  . Hypertension Father   . Stroke Father     2007  . Diabetes Maternal Grandmother   . Asthma Sister   . Asthma Brother   . Asthma Paternal Grandmother    History  Substance Use Topics  . Smoking status: Former Smoker    Quit date: 03/27/2014  . Smokeless tobacco: Never Used  . Alcohol Use: No    OB History    Gravida Para Term Preterm AB TAB SAB Ectopic Multiple Living   3 2 1 1 1  0 0 1 0 2     Review of Systems  Constitutional: Positive for chills. Negative for fever.  HENT: Positive for congestion and sore throat.   Respiratory: Positive for cough and shortness of breath.   All other systems reviewed and are negative.     Allergies  Penicillins and Sulfa antibiotics  Home Medications   Prior to Admission medications   Medication Sig Start Date End Date Taking? Authorizing Provider  azithromycin (ZITHROMAX Z-PAK) 250 MG tablet Take 2 tablets on Day 1. Take 1 tablet daily for 4 days. 10/21/14   Nobie Putnam, DO  colchicine 0.6 MG tablet Take 0.6 mg by mouth 2 (two) times daily. 01/20/14   Dorothy Spark, MD  diphenhydrAMINE (BENADRYL) 25 mg capsule Take 25 mg by mouth every 4 (four) hours as needed for allergies.    Historical Provider, MD  fexofenadine (ALLEGRA) 180 MG tablet Take 1 tablet (180 mg total) by mouth daily. 08/13/14   Leone Brand, MD  HYDROcodone-homatropine (HYCODAN) 5-1.5 MG/5ML syrup Take 5 mLs by mouth every 8 (eight) hours as needed for cough. Patient not taking: Reported on 10/27/2014 10/09/14   Timmothy Euler, MD  ibuprofen (ADVIL,MOTRIN) 200 MG tablet Take 600 mg by mouth every 6 (six) hours as needed for moderate pain.     Historical Provider, MD  ibuprofen (ADVIL,MOTRIN) 600 MG tablet Take 1 tablet (600 mg total) by mouth every 6 (six) hours as needed. Patient not taking: Reported on 10/27/2014 09/18/14   Varney Biles, MD  indomethacin (INDOCIN) 25 MG capsule Take 1 capsule (25 mg total) by mouth 3 (three) times daily with meals. May take up to 50mg  three times a day if no improvement with 25mg . Patient not taking: Reported on 10/27/2014 08/02/14   Alvina Chou, PA-C  indomethacin (INDOCIN) 50 MG capsule Take 1 capsule (50 mg total) by mouth 3 (three) times daily with meals. 01/20/14   Dorothy Spark, MD  ipratropium  (ATROVENT) 0.06 % nasal spray Place 2 sprays into both nostrils 4 (four) times daily. Patient taking differently: Place 2 sprays into both nostrils 4 (four) times daily as needed for rhinitis.  10/25/14   Hilton Sinclair, MD  levETIRAcetam (KEPPRA) 500 MG tablet Take 1 tablet (500 mg total) by mouth 2 (two) times daily. 06/04/14   Kalman Drape, MD  medroxyPROGESTERone (DEPO-PROVERA) 150 MG/ML injection Inject 150 mg into the muscle every 3 (three) months. Inject on  the 22nd of each month 02/11/14   Shelly Bombard, MD  omeprazole (PRILOSEC) 20 MG capsule Take 1 capsule (20 mg total) by mouth 2 (two) times daily before a meal. 01/28/14   Shelly Bombard, MD  ondansetron (ZOFRAN ODT) 8 MG disintegrating tablet Take 1 tablet (8 mg total) by mouth every 8 (eight) hours as needed for nausea or vomiting. 10/28/14   Hoy Morn, MD  oxyCODONE-acetaminophen (PERCOCET/ROXICET) 5-325 MG per tablet Take 1 tablet by mouth every 8 (eight) hours as needed for severe pain. 09/04/14   Frazier Richards, MD  Prenatal Vit-Fe Fumarate-FA (PNV PRENATAL PLUS MULTIVITAMIN) 27-1 MG TABS Take 1 tablet by mouth daily. 07/22/14   Historical Provider, MD   BP 129/92 mmHg  Pulse 77  Temp(Src) 98.3 F (36.8 C) (Oral)  Resp 22  SpO2 98% Physical Exam  Constitutional: She is oriented to person, place, and time. She appears well-developed and well-nourished. No distress.  HENT:  Head: Normocephalic and atraumatic.  Tender over maxillary and frontal sinuses.   Eyes: Conjunctivae and EOM are normal. Pupils are equal, round, and reactive to light.  Neck: Normal range of motion. Neck supple. No JVD present. No tracheal deviation present.  Cardiovascular: Normal rate, regular rhythm and normal heart sounds.   Pulmonary/Chest: Effort normal. No respiratory distress. She has no wheezes. She has no rales.  Slightly prolonged exhalation phase.   Abdominal: Soft. Bowel sounds are normal. She exhibits no distension and no mass.  There is no tenderness.  Musculoskeletal: Normal range of motion. She exhibits no edema.  Lymphadenopathy:    She has no cervical adenopathy.  Neurological: She is alert and oriented to person, place, and time. She has normal reflexes. No cranial nerve deficit. Coordination normal.  Skin: Skin is warm and dry.  Psychiatric: She has a normal mood and affect. Her behavior is normal. Thought content normal.  Nursing note and vitals reviewed.   ED Course  Procedures (including critical care time)  DIAGNOSTIC STUDIES: Oxygen Saturation is 98% on RA,  normal by my interpretation.    COORDINATION OF CARE:  1:46 AM Discussed treatment plan with patient at bedside.  Patient acknowledges and agrees with plan.    Labs Review Results for orders placed or performed during the hospital encounter of 11/10/14  CBC  Result Value Ref Range   WBC 6.0 4.0 - 10.5 K/uL   RBC 4.00 3.87 - 5.11 MIL/uL   Hemoglobin 11.4 (L) 12.0 - 15.0 g/dL   HCT 33.6 (L) 36.0 - 46.0 %   MCV 84.0 78.0 - 100.0 fL   MCH 28.5 26.0 - 34.0 pg   MCHC 33.9 30.0 - 36.0 g/dL   RDW 13.0 11.5 - 15.5 %   Platelets 255 150 - 400 K/uL  Basic metabolic panel  Result Value Ref Range   Sodium 139 137 - 147 mEq/L   Potassium 3.6 (L) 3.7 - 5.3 mEq/L   Chloride 104 96 - 112 mEq/L   CO2 19 19 - 32 mEq/L   Glucose, Bld 117 (H) 70 - 99 mg/dL   BUN 8 6 - 23 mg/dL   Creatinine, Ser 0.72 0.50 - 1.10 mg/dL   Calcium 8.9 8.4 - 10.5 mg/dL   GFR calc non Af Amer >90 >90 mL/min   GFR calc Af Amer >90 >90 mL/min   Anion gap 16 (H) 5 - 15  I-stat troponin, ED (not at [facility])  Result Value Ref Range   Troponin i, poc 0.00 0.00 - 0.08 ng/mL   Comment 3            Imaging Review Dg Chest 2 View  11/09/2014   CLINICAL DATA:  Cough, shortness of breath and fever. Initial encounter.  EXAM: CHEST  2 VIEW  COMPARISON:  10/28/2014  FINDINGS: The cardiomediastinal silhouette is unremarkable.  There is no evidence of focal airspace disease, pulmonary  edema, suspicious pulmonary nodule/mass, pleural effusion, or pneumothorax. No acute bony abnormalities are identified.  IMPRESSION: No active cardiopulmonary disease.   Electronically Signed   By: Hassan Rowan M.D.   On: 11/09/2014 23:24     EKG Interpretation   Date/Time:  Monday November 09 2014 22:50:40 EST Ventricular Rate:  86 PR Interval:  158 QRS Duration: 82 QT Interval:  366 QTC Calculation: 437 R Axis:   57 Text Interpretation:  Normal sinus rhythm Normal ECG When compared with  ECG of 08/02/2014, No significant change was found Confirmed by [facility]  MD,  Celise Bazar ([id number]) on 11/10/2014 1:34:43 AM      MDM   Final diagnoses:  Other subacute sinusitis    Cough which is felt to respond to 2 courses of azithromycin and has not responded to hydrocodone cough syrup or dextromethorphan. I suspect there is some component of bronchospasm and will give a trial of albuterol neb by nebulizer. She has tenderness in her sinus areas suggestive of sinusitis. Old records are reviewed and she has 2 office visits and one ED visit for her ongoing cough with 2 courses of azithromycin having been prescribed.  Following albuterol, cough is somewhat improved. She is given a dose of prednisone and is discharged with prescription for an albuterol inhaler and for a 5 day course of prednisone. She will also be placed on antibiotics. She has felt a macrolide and is allergic to penicillin. She is given a prescription for levofloxacin.  I personally performed the services described in this documentation, which was scribed in my presence. The recorded information has been reviewed and is accurate.     Shanon Brow  Roxanne Mins, MD 80/32/12 [id number]

## 2014-11-17 ENCOUNTER — Telehealth: Payer: Self-pay | Admitting: Family Medicine

## 2014-11-17 NOTE — Telephone Encounter (Signed)
Legs are weak and barely able to work. Has been vomiting. Migraine is not going away. Please advise

## 2014-11-17 NOTE — Telephone Encounter (Signed)
Attempted to call, no answer and no machine. Fleeger, Jessica Dawn  

## 2014-11-18 ENCOUNTER — Inpatient Hospital Stay (HOSPITAL_COMMUNITY)
Admission: EM | Admit: 2014-11-18 | Discharge: 2014-11-20 | DRG: 101 | Disposition: A | Discharge status: Home / Self Care | Payer: No Typology Code available for payment source | Attending: Family Medicine | Admitting: Family Medicine

## 2014-11-18 ENCOUNTER — Ambulatory Visit: Payer: No Typology Code available for payment source | Admitting: Family Medicine

## 2014-11-18 ENCOUNTER — Encounter (HOSPITAL_COMMUNITY): Payer: Self-pay

## 2014-11-18 DIAGNOSIS — D649 Anemia, unspecified: Secondary | ICD-10-CM | POA: Diagnosis present

## 2014-11-18 DIAGNOSIS — I309 Acute pericarditis, unspecified: Secondary | ICD-10-CM

## 2014-11-18 DIAGNOSIS — D573 Sickle-cell trait: Secondary | ICD-10-CM | POA: Diagnosis present

## 2014-11-18 DIAGNOSIS — K210 Gastro-esophageal reflux disease with esophagitis: Secondary | ICD-10-CM

## 2014-11-18 DIAGNOSIS — R531 Weakness: Secondary | ICD-10-CM | POA: Diagnosis not present

## 2014-11-18 DIAGNOSIS — G40409 Other generalized epilepsy and epileptic syndromes, not intractable, without status epilepticus: Principal | ICD-10-CM | POA: Diagnosis present

## 2014-11-18 DIAGNOSIS — R569 Unspecified convulsions: Secondary | ICD-10-CM

## 2014-11-18 DIAGNOSIS — I319 Disease of pericardium, unspecified: Secondary | ICD-10-CM | POA: Diagnosis present

## 2014-11-18 DIAGNOSIS — K219 Gastro-esophageal reflux disease without esophagitis: Secondary | ICD-10-CM | POA: Diagnosis present

## 2014-11-18 DIAGNOSIS — J45909 Unspecified asthma, uncomplicated: Secondary | ICD-10-CM | POA: Diagnosis present

## 2014-11-18 DIAGNOSIS — G4733 Obstructive sleep apnea (adult) (pediatric): Secondary | ICD-10-CM | POA: Diagnosis present

## 2014-11-18 DIAGNOSIS — G43909 Migraine, unspecified, not intractable, without status migrainosus: Secondary | ICD-10-CM | POA: Diagnosis present

## 2014-11-18 DIAGNOSIS — Z87891 Personal history of nicotine dependence: Secondary | ICD-10-CM

## 2014-11-18 LAB — CBC WITH DIFFERENTIAL/PLATELET
Basophils Absolute: 0.1 10*3/uL (ref 0.0–0.1)
Basophils Relative: 1 % (ref 0–1)
Eosinophils Absolute: 0.2 10*3/uL (ref 0.0–0.7)
Eosinophils Relative: 4 % (ref 0–5)
HCT: 37.4 % (ref 36.0–46.0)
Hemoglobin: 12.7 g/dL (ref 12.0–15.0)
Lymphocytes Relative: 45 % (ref 12–46)
Lymphs Abs: 2.8 10*3/uL (ref 0.7–4.0)
MCH: 29.1 pg (ref 26.0–34.0)
MCHC: 34 g/dL (ref 30.0–36.0)
MCV: 85.6 fL (ref 78.0–100.0)
Monocytes Absolute: 0.3 10*3/uL (ref 0.1–1.0)
Monocytes Relative: 5 % (ref 3–12)
Neutro Abs: 2.8 10*3/uL (ref 1.7–7.7)
Neutrophils Relative %: 45 % (ref 43–77)
Platelets: 229 10*3/uL (ref 150–400)
RBC: 4.37 MIL/uL (ref 3.87–5.11)
RDW: 12.9 % (ref 11.5–15.5)
WBC: 6.3 10*3/uL (ref 4.0–10.5)

## 2014-11-18 LAB — URINE MICROSCOPIC-ADD ON

## 2014-11-18 LAB — URINALYSIS, ROUTINE W REFLEX MICROSCOPIC
Bilirubin Urine: NEGATIVE
GLUCOSE, UA: NEGATIVE mg/dL
Hgb urine dipstick: NEGATIVE
KETONES UR: NEGATIVE mg/dL
NITRITE: NEGATIVE
PH: 6 (ref 5.0–8.0)
Protein, ur: NEGATIVE mg/dL
SPECIFIC GRAVITY, URINE: 1.025 (ref 1.005–1.030)
Urobilinogen, UA: 0.2 mg/dL (ref 0.0–1.0)

## 2014-11-18 LAB — COMPREHENSIVE METABOLIC PANEL
ALT: 18 U/L (ref 0–35)
AST: 17 U/L (ref 0–37)
Albumin: 3.9 g/dL (ref 3.5–5.2)
Alkaline Phosphatase: 77 U/L (ref 39–117)
Anion gap: 14 (ref 5–15)
BUN: 7 mg/dL (ref 6–23)
CO2: 20 mEq/L (ref 19–32)
Calcium: 9.1 mg/dL (ref 8.4–10.5)
Chloride: 106 mEq/L (ref 96–112)
Creatinine, Ser: 0.74 mg/dL (ref 0.50–1.10)
GFR calc Af Amer: >90 mL/min (ref >90)
GFR calc non Af Amer: >90 mL/min (ref >90)
Glucose, Bld: 91 mg/dL (ref 70–99)
Potassium: 4 mEq/L (ref 3.7–5.3)
Sodium: 140 mEq/L (ref 137–147)
Total Bilirubin: 0.3 mg/dL (ref 0.3–1.2)
Total Protein: 7 g/dL (ref 6.0–8.3)

## 2014-11-18 LAB — D-DIMER, QUANTITATIVE (NOT AT ARMC)

## 2014-11-18 LAB — TROPONIN I
Troponin I: <0.3 ng/mL (ref <0.30)
Troponin I: <0.3 ng/mL (ref <0.30)
Troponin I: <0.3 ng/mL (ref <0.30)

## 2014-11-18 LAB — MRSA PCR SCREENING: MRSA by PCR: NEGATIVE

## 2014-11-18 LAB — PREGNANCY, URINE: Preg Test, Ur: NEGATIVE

## 2014-11-18 MED ORDER — PRENATAL MULTIVITAMIN CH
1.0000 | ORAL_TABLET | Freq: Every day | ORAL | Status: DC
  Administered 2014-11-18 – 2014-11-20 (×3): 1 via ORAL
  Filled 2014-11-18 (×3): qty 1

## 2014-11-18 MED ORDER — COLCHICINE 0.6 MG PO TABS
0.6000 mg | ORAL_TABLET | Freq: Two times a day (BID) | ORAL | Status: DC
  Administered 2014-11-18: 0.6 mg via ORAL
  Filled 2014-11-18 (×2): qty 1

## 2014-11-18 MED ORDER — HEPARIN SODIUM (PORCINE) 5000 UNIT/ML IJ SOLN
5000.0000 [IU] | Freq: Three times a day (TID) | INTRAMUSCULAR | Status: DC
  Administered 2014-11-18 – 2014-11-20 (×6): 5000 [IU] via SUBCUTANEOUS
  Filled 2014-11-18 (×8): qty 1

## 2014-11-18 MED ORDER — SODIUM CHLORIDE 0.9 % IV SOLN
INTRAVENOUS | Status: DC
  Administered 2014-11-18: 125 mL/h via INTRAVENOUS
  Administered 2014-11-18 – 2014-11-20 (×5): via INTRAVENOUS

## 2014-11-18 MED ORDER — ONDANSETRON HCL 4 MG PO TABS: 8.0000 mg | ORAL_TABLET | Freq: Four times a day (QID) | ORAL | Status: DC | PRN

## 2014-11-18 MED ORDER — ALUM & MAG HYDROXIDE-SIMETH 200-200-20 MG/5ML PO SUSP: 30.0000 mL | Freq: Four times a day (QID) | ORAL | Status: DC | PRN

## 2014-11-18 MED ORDER — LEVETIRACETAM 500 MG PO TABS
500.0000 mg | ORAL_TABLET | Freq: Two times a day (BID) | ORAL | Status: DC
  Administered 2014-11-18: 500 mg via ORAL
  Filled 2014-11-18 (×2): qty 1

## 2014-11-18 MED ORDER — ACETAMINOPHEN 325 MG PO TABS
650.0000 mg | ORAL_TABLET | Freq: Four times a day (QID) | ORAL | Status: DC | PRN
  Administered 2014-11-18 – 2014-11-19 (×3): 650 mg via ORAL
  Filled 2014-11-18 (×5): qty 2

## 2014-11-18 MED ORDER — ALBUTEROL SULFATE (2.5 MG/3ML) 0.083% IN NEBU
2.0000 mL | INHALATION_SOLUTION | RESPIRATORY_TRACT | Status: DC | PRN
Start: 2014-11-18 — End: 2014-11-20

## 2014-11-18 MED ORDER — OXYCODONE HCL 5 MG PO TABS
5.0000 mg | ORAL_TABLET | Freq: Once | ORAL | Status: AC
  Administered 2014-11-18: 5 mg via ORAL
  Filled 2014-11-18: qty 1

## 2014-11-18 MED ORDER — PANTOPRAZOLE SODIUM 40 MG PO TBEC
40.0000 mg | DELAYED_RELEASE_TABLET | Freq: Every day | ORAL | Status: DC
  Administered 2014-11-18 – 2014-11-20 (×3): 40 mg via ORAL
  Filled 2014-11-18 (×3): qty 1

## 2014-11-18 MED ORDER — LEVETIRACETAM 750 MG PO TABS
750.0000 mg | ORAL_TABLET | Freq: Two times a day (BID) | ORAL | Status: DC
  Administered 2014-11-18 – 2014-11-20 (×4): 750 mg via ORAL
  Filled 2014-11-18 (×6): qty 1

## 2014-11-18 MED ORDER — ONDANSETRON 8 MG/NS 50 ML IVPB
8.0000 mg | Freq: Four times a day (QID) | INTRAVENOUS | Status: DC | PRN
  Filled 2014-11-18: qty 8

## 2014-11-18 MED ORDER — PNEUMOCOCCAL VAC POLYVALENT 25 MCG/0.5ML IJ INJ
0.5000 mL | INJECTION | INTRAMUSCULAR | Status: AC
  Administered 2014-11-19: 0.5 mL via INTRAMUSCULAR
  Filled 2014-11-18: qty 0.5

## 2014-11-18 NOTE — ED Notes (Signed)
Per EMS: pt with history of seizures, daily Keppra. Pt became weak this AM, lasting throughout the day. First seizure occurred ~ 2000 for 48mins. Two subsequent seizures at 2230 & 1330 lasting 2 mins each. Pt A/Ox4, slightly slurred speech.

## 2014-11-18 NOTE — ED Provider Notes (Signed)
CSN: [id number]     Arrival date & time 11/18/14  0102 History  This chart was scribe for Andrena Mews, MD by Judithann Sauger, ED Scribe. The patient was seen in room A13C/A13C and the patient's care was started at 1:12 AM.    Chief Complaint  Patient presents with  . Seizures   The history is provided by the patient. No language interpreter was used.   HPI Comments: Nicole [name] is a 38 y.o. female brought in by ambulance, who presents to the Emergency Department for suspected witnessed seizure. Per EMS patient had seizure that occurred roughly at 8 PM lasting 10 minutes. Patient then had 2 other seizures lasting 2 minutes each. No incontinence or tongue biting. Patient was drowsy on presentation but answering questions and following commands. She admits to compliance with her Keppra. She denies CP. She reports nausea earlier today which she states that she took medication for. She reports working 2 jobs and adds that she has been working a lot lately.   Past Medical History  Diagnosis Date  . Anemia   . Asthma   . Blood transfusion 2011    r/t anemia  . Migraine   . Sickle cell trait   . Seizures     last sz Jan 2012  . Seizures   . Fibroid   . Tooth abscess   . Sickle cell trait   . Migraine    Past Surgical History  Procedure Laterality Date  . Tubal ligation Bilateral 2005   Family History  Problem Relation Age of Onset  . Anesthesia problems Neg Hx   . Hypertension Mother   . Diabetes Mother   . Cancer Mother     Colon, brain, 2 other  . Asthma Mother   . Clotting disorder Mother   . Heart disease Mother     20, stents  . Stroke Mother     2012  . Diabetes Father   . Asthma Father   . Clotting disorder Father   . Sickle cell anemia Father   . Heart disease Father     2000, stents  . Hypertension Father   . Stroke Father     2007  . Diabetes Maternal Grandmother   . Asthma Sister   . Asthma Brother   . Asthma Paternal Grandmother    History   Substance Use Topics  . Smoking status: Former Smoker    Quit date: 03/27/2014  . Smokeless tobacco: Never Used  . Alcohol Use: No   OB History    Gravida Para Term Preterm AB TAB SAB Ectopic Multiple Living   3 2 1 1 1  0 0 1 0 2     Review of Systems  Constitutional: Negative for fever and chills.  Respiratory: Negative for cough and shortness of breath.   Cardiovascular: Negative for chest pain.  Gastrointestinal: Positive for nausea, vomiting and abdominal pain. Negative for diarrhea and constipation.  Musculoskeletal: Positive for arthralgias (left calf). Negative for myalgias, back pain, neck pain and neck stiffness.  Skin: Negative for rash and wound.  Neurological: Positive for seizures. Negative for dizziness, syncope, weakness, light-headedness, numbness and headaches.  All other systems reviewed and are negative.     Allergies  Penicillins and Sulfa antibiotics  Home Medications   Prior to Admission medications   Medication Sig Start Date End Date Taking? Authorizing Provider  albuterol (PROVENTIL HFA;VENTOLIN HFA) 108 (90 BASE) MCG/ACT inhaler Inhale 2 puffs into the lungs every 4 (four) hours as  needed for wheezing or shortness of breath (or coughing). 92/4/26  Yes Delora Fuel, MD  colchicine 0.6 MG tablet Take 0.6 mg by mouth 2 (two) times daily. 01/20/14  Yes Dorothy Spark, MD  diphenhydrAMINE (BENADRYL) 25 mg capsule Take 25 mg by mouth every 4 (four) hours as needed for allergies.   Yes Historical Provider, MD  fexofenadine (ALLEGRA) 180 MG tablet Take 1 tablet (180 mg total) by mouth daily. 08/13/14  Yes Leone Brand, MD  ibuprofen (ADVIL,MOTRIN) 200 MG tablet Take 600 mg by mouth every 6 (six) hours as needed for moderate pain.    Yes Historical Provider, MD  indomethacin (INDOCIN) 50 MG capsule Take 1 capsule (50 mg total) by mouth 3 (three) times daily with meals. 01/20/14  Yes Dorothy Spark, MD  ipratropium (ATROVENT) 0.06 % nasal spray Place 2 sprays  into both nostrils 4 (four) times daily. Patient taking differently: Place 2 sprays into both nostrils 4 (four) times daily as needed for rhinitis.  10/25/14  Yes Hilton Sinclair, MD  levETIRAcetam (KEPPRA) 500 MG tablet Take 1 tablet (500 mg total) by mouth 2 (two) times daily. 06/04/14  Yes Kalman Drape, MD  medroxyPROGESTERone (DEPO-PROVERA) 150 MG/ML injection Inject 150 mg into the muscle every 3 (three) months. Inject on  the 22nd of each month 02/11/14  Yes Shelly Bombard, MD  omeprazole (PRILOSEC) 20 MG capsule Take 1 capsule (20 mg total) by mouth 2 (two) times daily before a meal. 01/28/14  Yes Shelly Bombard, MD  ondansetron (ZOFRAN ODT) 8 MG disintegrating tablet Take 1 tablet (8 mg total) by mouth every 8 (eight) hours as needed for nausea or vomiting. 10/28/14  Yes Hoy Morn, MD  oxyCODONE-acetaminophen (PERCOCET/ROXICET) 5-325 MG per tablet Take 1 tablet by mouth every 8 (eight) hours as needed for severe pain. 09/04/14  Yes Frazier Richards, MD  Prenatal Vit-Fe Fumarate-FA (PNV PRENATAL PLUS MULTIVITAMIN) 27-1 MG TABS Take 1 tablet by mouth daily. 07/22/14  Yes Historical Provider, MD  azithromycin (ZITHROMAX Z-PAK) 250 MG tablet Take 2 tablets on Day 1. Take 1 tablet daily for 4 days. Patient not taking: Reported on 11/10/2014 10/21/14   Nobie Putnam, DO  HYDROcodone-homatropine [facility]) 5-1.5 MG/5ML syrup Take 5 mLs by mouth every 8 (eight) hours as needed for cough. Patient not taking: Reported on 10/27/2014 10/09/14   Timmothy Euler, MD  ibuprofen (ADVIL,MOTRIN) 600 MG tablet Take 1 tablet (600 mg total) by mouth every 6 (six) hours as needed. Patient not taking: Reported on 10/27/2014 09/18/14   Varney Biles, MD  indomethacin (INDOCIN) 25 MG capsule Take 1 capsule (25 mg total) by mouth 3 (three) times daily with meals. May take up to 50mg  three times a day if no improvement with 25mg . Patient not taking: Reported on 10/27/2014 08/02/14   Alvina Chou, PA-C   levofloxacin (LEVAQUIN) 500 MG tablet Take 1 tablet (500 mg total) by mouth daily. Patient not taking: Reported on 11/18/2014 83/4/19   Delora Fuel, MD  predniSONE (DELTASONE) 50 MG tablet Take 1 tablet (50 mg total) by mouth daily. Patient not taking: Reported on 11/18/2014 62/2/29   Delora Fuel, MD   BP 798/92 mmHg  Pulse 65  Resp 19  SpO2 98% Physical Exam  Constitutional: She is oriented to person, place, and time. She appears well-developed and well-nourished. No distress.  Patient is mildly drowsy appearing. Clear speech.  HENT:  Head: Normocephalic and atraumatic.  Mouth/Throat: Oropharynx is clear and moist.  No intraoral trauma.  Eyes: EOM are normal. Pupils are equal, round, and reactive to light.  Neck: Normal range of motion. Neck supple.  No posterior midline cervical tenderness with palpation. No meningismus.  Cardiovascular: Normal rate and regular rhythm.   Pulmonary/Chest: Effort normal and breath sounds normal. No respiratory distress. She has no wheezes. She has no rales.  Abdominal: Soft. Bowel sounds are normal. She exhibits no distension and no mass. There is tenderness (mild left upper quadrant tenderness with palpation. No rebound or guarding.). There is no rebound and no guarding.  Musculoskeletal: Normal range of motion. She exhibits no edema or tenderness.  No CVA tenderness.  Neurological: She is alert and oriented to person, place, and time.  Patient hesitantly following commands but does so with encouragement. No focal deficits. Sensation is grossly intact.  Skin: Skin is warm and dry. No rash noted. No erythema.  Psychiatric: She has a normal mood and affect. Her behavior is normal.  Nursing note and vitals reviewed.   ED Course  Procedures (including critical care time) DIAGNOSTIC STUDIES: Oxygen Saturation is 96% on RA, normal by my interpretation.    COORDINATION OF CARE: 1:18 AM- Pt advised of plan for treatment and pt agrees.    Labs  Review Labs Reviewed  URINALYSIS, ROUTINE W REFLEX MICROSCOPIC - Abnormal; Notable for the following:    APPearance CLOUDY (*)    Leukocytes, UA MODERATE (*)    All other components within normal limits  URINE MICROSCOPIC-ADD ON - Abnormal; Notable for the following:    Squamous Epithelial / LPF MANY (*)    Bacteria, UA MANY (*)    All other components within normal limits  CBC WITH DIFFERENTIAL  COMPREHENSIVE METABOLIC PANEL  PREGNANCY, URINE    Imaging Review No results found.   EKG Interpretation None      Date: 11/18/2014  Rate: 77  Rhythm: normal sinus rhythm  QRS Axis: normal  Intervals: normal  ST/T Wave abnormalities: normal  Conduction Disutrbances:none  Narrative Interpretation:   Old EKG Reviewed: none available   MDM   Final diagnoses:  None      I personally performed the services described in this documentation, which was scribed in my presence. The recorded information has been reviewed and is accurate.   No further seizure-like activity in the emergency department. Patient states she still feels fatigued. Discussed with family medicine resident. Will evaluate in the emergency department.  Julianne Rice, MD 11/18/14 [id number]

## 2014-11-18 NOTE — Consult Note (Signed)
NEURO HOSPITALIST CONSULT NOTE    Reason for Consult: seizure   HPI:                                                                                                                                          Nicole [name] is an 38 y.o. female brought in to ED for possible seizure. "Per EMS patient had seizure that occurred roughly at 8 PM lasting 10 minutes. Patient then had 2 other seizures lasting 2 minutes each. No incontinence or tongue biting. Patient was drowsy on presentation but answering questions and following commands."  Patient had no further seizure activity while in hospital. Prior to hospitalization she has had 4+ days of N/V and states she has been vomiting 4+ times a day. Pateint states she has been compliant with her medications however she has been vomiting every time she smells food and has noticed she had vomited up a couple pills.    She used to see Dr. Doy Mince of [facility] but since he has moved away she has been having her AED prescribed by her PCP.  She states she has been on Dilantin and tegretol in past but was changed to [place] this June after presenting to ED with seizure. She has not followed up with neurology due to insurance issues.    Past Medical History  Diagnosis Date  . Anemia   . Asthma   . Blood transfusion 2011    r/t anemia  . Migraine   . Sickle cell trait   . Seizures     last sz Jan 2012  . Seizures   . Fibroid   . Tooth abscess   . Sickle cell trait   . Migraine     Past Surgical History  Procedure Laterality Date  . Tubal ligation Bilateral 2005    Family History  Problem Relation Age of Onset  . Anesthesia problems Neg Hx   . Hypertension Mother   . Diabetes Mother   . Cancer Mother     Colon, brain, 2 other  . Asthma Mother   . Clotting disorder Mother   . Heart disease Mother     51, stents  . Stroke Mother     2012  . Diabetes Father   . Asthma Father   . Clotting disorder Father   . Sickle cell anemia  Father   . Heart disease Father     2000, stents  . Hypertension Father   . Stroke Father     2007  . Diabetes Maternal Grandmother   . Asthma Sister   . Asthma Brother   . Asthma Paternal Grandmother      Social History:  reports that she quit smoking about 7 months ago. She has never used smokeless tobacco. She  reports that she does not drink alcohol or use illicit drugs.  Allergies  Allergen Reactions  . Penicillins Swelling  . Sulfa Antibiotics Rash    MEDICATIONS:                                                                                                                     Prior to Admission:  Prescriptions prior to admission  Medication Sig Dispense Refill Last Dose  . albuterol (PROVENTIL HFA;VENTOLIN HFA) 108 (90 BASE) MCG/ACT inhaler Inhale 2 puffs into the lungs every 4 (four) hours as needed for wheezing or shortness of breath (or coughing). 1 Inhaler 0 11/17/2014 at Unknown time  . colchicine 0.6 MG tablet Take 0.6 mg by mouth 2 (two) times daily.   11/17/2014 at Unknown time  . diphenhydrAMINE (BENADRYL) 25 mg capsule Take 25 mg by mouth every 4 (four) hours as needed for allergies.   11/17/2014 at Unknown time  . fexofenadine (ALLEGRA) 180 MG tablet Take 1 tablet (180 mg total) by mouth daily. 30 tablet 3 11/17/2014 at Unknown time  . ibuprofen (ADVIL,MOTRIN) 200 MG tablet Take 600 mg by mouth every 6 (six) hours as needed for moderate pain.    11/17/2014 at Unknown time  . indomethacin (INDOCIN) 50 MG capsule Take 1 capsule (50 mg total) by mouth 3 (three) times daily with meals. 90 capsule 3 11/17/2014 at Unknown time  . ipratropium (ATROVENT) 0.06 % nasal spray Place 2 sprays into both nostrils 4 (four) times daily. (Patient taking differently: Place 2 sprays into both nostrils 4 (four) times daily as needed for rhinitis. ) 15 mL 0 11/17/2014 at Unknown time  . levETIRAcetam (KEPPRA) 500 MG tablet Take 1 tablet (500 mg total) by mouth 2 (two) times daily. 60 tablet 0  11/17/2014 at Unknown time  . medroxyPROGESTERone (DEPO-PROVERA) 150 MG/ML injection Inject 150 mg into the muscle every 3 (three) months. Inject on  the 22nd of each month   10/01/2014  . omeprazole (PRILOSEC) 20 MG capsule Take 1 capsule (20 mg total) by mouth 2 (two) times daily before a meal. 60 capsule 5 11/17/2014 at Unknown time  . ondansetron (ZOFRAN ODT) 8 MG disintegrating tablet Take 1 tablet (8 mg total) by mouth every 8 (eight) hours as needed for nausea or vomiting. 10 tablet 0 11/17/2014 at Unknown time  . oxyCODONE-acetaminophen (PERCOCET/ROXICET) 5-325 MG per tablet Take 1 tablet by mouth every 8 (eight) hours as needed for severe pain.   11/17/2014 at Unknown time  . Prenatal Vit-Fe Fumarate-FA (PNV PRENATAL PLUS MULTIVITAMIN) 27-1 MG TABS Take 1 tablet by mouth daily.   11/17/2014 at Unknown time  . azithromycin (ZITHROMAX Z-PAK) 250 MG tablet Take 2 tablets on Day 1. Take 1 tablet daily for 4 days. (Patient not taking: Reported on 11/10/2014) 6 tablet 0 Not Taking at Unknown time  . HYDROcodone-homatropine (HYCODAN) 5-1.5 MG/5ML syrup Take 5 mLs by mouth every 8 (eight) hours as needed for cough. (Patient not taking: Reported on 10/27/2014) 120 mL  0 Completed Course at Unknown time  . ibuprofen (ADVIL,MOTRIN) 600 MG tablet Take 1 tablet (600 mg total) by mouth every 6 (six) hours as needed. (Patient not taking: Reported on 10/27/2014) 30 tablet 0 Not Taking  . indomethacin (INDOCIN) 25 MG capsule Take 1 capsule (25 mg total) by mouth 3 (three) times daily with meals. May take up to 50mg  three times a day if no improvement with 25mg . (Patient not taking: Reported on 10/27/2014) 30 capsule 0 Not Taking at Unknown time  . levofloxacin (LEVAQUIN) 500 MG tablet Take 1 tablet (500 mg total) by mouth daily. (Patient not taking: Reported on 11/18/2014) 7 tablet 0 Completed Course at Unknown time  . predniSONE (DELTASONE) 50 MG tablet Take 1 tablet (50 mg total) by mouth daily. (Patient not taking:  Reported on 11/18/2014) 5 tablet 0 Completed Course at Unknown time   Scheduled: . colchicine  0.6 mg Oral BID  . levETIRAcetam  500 mg Oral BID  . pantoprazole  40 mg Oral Daily  . [START ON 11/19/2014] pneumococcal 23 valent vaccine  0.5 mL Intramuscular Tomorrow-1000  . prenatal multivitamin  1 tablet Oral Daily     ROS:                                                                                                                                       History obtained from the patient  General ROS: negative for - chills, fatigue, fever, night sweats, weight gain or weight loss Psychological ROS: negative for - behavioral disorder, hallucinations, memory difficulties, mood swings or suicidal ideation Ophthalmic ROS: negative for - blurry vision, double vision, eye pain or loss of vision ENT ROS: negative for - epistaxis, nasal discharge, oral lesions, sore throat, tinnitus or vertigo Allergy and Immunology ROS: negative for - hives or itchy/watery eyes Hematological and Lymphatic ROS: negative for - bleeding problems, bruising or swollen lymph nodes Endocrine ROS: negative for - galactorrhea, hair pattern changes, polydipsia/polyuria or temperature intolerance Respiratory ROS: negative for - cough, hemoptysis, shortness of breath or wheezing Cardiovascular ROS: negative for - chest pain, dyspnea on exertion, edema or irregular heartbeat Gastrointestinal ROS: negative for - abdominal pain, diarrhea, hematemesis, nausea/vomiting or stool incontinence Genito-Urinary ROS: negative for - dysuria, hematuria, incontinence or urinary frequency/urgency Musculoskeletal ROS: negative for - joint swelling or muscular weakness Neurological ROS: as noted in HPI Dermatological ROS: negative for rash and skin lesion changes   Blood pressure 119/79, pulse 74, temperature 98.6 F (37 C), temperature source Oral, resp. rate 16, height 5\' 5"  (1.651 m), weight 110.224 kg (243 lb), SpO2 99  %.   Neurologic Examination:  Physical Exam  Constitutional: He appears well-developed and well-nourished.  Psych: Affect appropriate to situation Eyes: No scleral injection HENT: No OP obstrucion Head: Normocephalic.  Cardiovascular: Normal rate and regular rhythm.  Respiratory: Effort normal and breath sounds normal.  GI: Soft. Bowel sounds are normal. No distension. There is no tenderness.  Skin: WDI  Neuro Exam: Mental Status: Alert, oriented, thought content appropriate.  Speech fluent without evidence of aphasia.  Able to follow 3 step commands without difficulty. Cranial Nerves: II: Discs flat bilaterally; Visual fields grossly normal, pupils equal, round, reactive to light and accommodation III,IV, VI: ptosis not present, extra-ocular motions intact bilaterally V,VII: smile symmetric, facial light touch sensation normal bilaterally VIII: hearing normal bilaterally IX,X: gag reflex present XI: bilateral shoulder shrug XII: midline tongue extension without atrophy or fasciculations  Motor: Right : Upper extremity   5/5    Left:     Upper extremity   5/5  Lower extremity   5/5     Lower extremity   0/5 --refusing to move left LE due to "sever pain in th thigh and ankle".  When distracted will move the leg spontaneously antigravity.  Tone and bulk:normal tone throughout; no atrophy noted Sensory: Pinprick and light touch intact throughout, bilaterally Deep Tendon Reflexes:  Right: Upper Extremity   Left: Upper extremity   biceps (C-5 to C-6) 2/4   biceps (C-5 to C-6) 2/4 tricep (C7) 2/4    triceps (C7) 2/4 Brachioradialis (C6) 2/4  Brachioradialis (C6) 2/4  Lower Extremity Lower Extremity  quadriceps (L-2 to L-4) 1/4   quadriceps (L-2 to L-4) 1/4 Achilles (S1) 1/4   Achilles (S1) 1/4  Plantars: Right: downgoing   Left: downgoing Cerebellar: normal finger-to-nose,    Gait: not tested due to left leg pain.  CV: pulses palpable throughout    Lab Results: Basic Metabolic Panel:  Recent Labs Lab 11/18/14 0139  NA 140  K 4.0  CL 106  CO2 20  GLUCOSE 91  BUN 7  CREATININE 0.74  CALCIUM 9.1    Liver Function Tests:  Recent Labs Lab 11/18/14 0139  AST 17  ALT 18  ALKPHOS 77  BILITOT 0.3  PROT 7.0  ALBUMIN 3.9   No results for input(s): LIPASE, AMYLASE in the last 168 hours. No results for input(s): AMMONIA in the last 168 hours.  CBC:  Recent Labs Lab 11/18/14 0139  WBC 6.3  NEUTROABS 2.8  HGB 12.7  HCT 37.4  MCV 85.6  PLT 229    Cardiac Enzymes:  Recent Labs Lab 11/18/14 1000  TROPONINI <0.30    Lipid Panel: No results for input(s): CHOL, TRIG, HDL, CHOLHDL, VLDL, LDLCALC in the last 168 hours.  CBG: No results for input(s): GLUCAP in the last 168 hours.  Microbiology: Results for orders placed or performed during the hospital encounter of 11/18/14  MRSA PCR Screening     Status: None   Collection Time: 11/18/14 10:00 AM  Result Value Ref Range Status   MRSA by PCR NEGATIVE NEGATIVE Final    Comment:        The GeneXpert MRSA Assay (FDA approved for NASAL specimens only), is one component of a comprehensive MRSA colonization surveillance program. It is not intended to diagnose MRSA infection nor to guide or monitor treatment for MRSA infections.     Coagulation Studies: No results for input(s): LABPROT, INR in the last 72 hours.  Imaging: No results found.   Assessment and plan per attending neurologist  Etta Quill PA-C  [facility] [phone]  11/18/2014, 1:05 PM   Assessment/Plan:  38 YO female with break through seizure.  Seizure likely secondary to inability to keep medication in system due to multiple episodes of emesis.   Recommend: 1) Increase Keppra dose to 750 mg BID--if continued N/V would give IV and then switch to PO when able to keep medication down.  2)  follow up with out patient neurology or PCP  I personally participated in this patient's evaluation and management, including formulating the above clinical impression and management recommendations.  [name] M.D. [facility] [phone]

## 2014-11-18 NOTE — Consult Note (Addendum)
CARDIOLOGY CONSULT NOTE   Patient ID: Nicole [name] MRN: [id number], DOB/AGE: June 07, 1976   Admit date: 11/18/2014 Date of Consult: 11/18/2014  Primary Physician: Tawanna Sat, MD Primary Cardiologist: Dorothy Spark, MD   Reason for consult:  Epigastric pain, nausea/vomiting, fever, GI bleeding  Problem List  Past Medical History  Diagnosis Date  . Anemia   . Asthma   . Blood transfusion 2011    r/t anemia  . Migraine   . Sickle cell trait   . Seizures     last sz Jan 2012  . Seizures   . Fibroid   . Tooth abscess   . Sickle cell trait   . Migraine     Past Surgical History  Procedure Laterality Date  . Tubal ligation Bilateral 2005     Allergies  Allergies  Allergen Reactions  . Penicillins Swelling  . Sulfa Antibiotics Rash    HPI   38 Y/O F with PMX of GERD, Seizure disorder, OSA,Migraine headache presented with 3 days hx of N/V and diarrhea. She vomites at least 4 times a day, denies blood in her vomitus but there was blood in her stool yesterday, there is associated epigastric pain and lower abdominal pain, denies urinary symptoms. Had fever at home. For the last 3 days she has generalized weakness mostly of her UL and LL B/L, she fell at home few times, she is unable to stand due to weakness. She developed LL pain few days ago especially around her calf area, denies trauma to her LL. She stated she had seizure yesterday, last seizure prior to then was months ago, she is compliant with her medications. She was seen in cardiology clinic and treated empirically for an episode of acute pericarditis. She was scheduled for a cardiac MRI to confirm, however due to technical difficulties (inability to get iv), it wasn't done. Indomethacine was stopped after 4 weeks, has been on colchicine since March 2015.  She states that her symptoms of chest pain have improved.    Inpatient Medications  . colchicine  0.6 mg Oral BID  . levETIRAcetam  500 mg Oral BID  .  pantoprazole  40 mg Oral Daily  . [START ON 11/19/2014] pneumococcal 23 valent vaccine  0.5 mL Intramuscular Tomorrow-1000  . prenatal multivitamin  1 tablet Oral Daily    Family History Family History  Problem Relation Age of Onset  . Anesthesia problems Neg Hx   . Hypertension Mother   . Diabetes Mother   . Cancer Mother     Colon, brain, 2 other  . Asthma Mother   . Clotting disorder Mother   . Heart disease Mother     30, stents  . Stroke Mother     2012  . Diabetes Father   . Asthma Father   . Clotting disorder Father   . Sickle cell anemia Father   . Heart disease Father     2000, stents  . Hypertension Father   . Stroke Father     2007  . Diabetes Maternal Grandmother   . Asthma Sister   . Asthma Brother   . Asthma Paternal Grandmother      Social History History   Social History  . Marital Status: Married    Spouse Name: N/A    Number of Children: N/A  . Years of Education: N/A   Occupational History  . Not on file.   Social History Main Topics  . Smoking status: Former Smoker  Quit date: 03/27/2014  . Smokeless tobacco: Never Used  . Alcohol Use: No  . Drug Use: No  . Sexual Activity:    Partners: Male    Birth Control/ Protection: Condom, Injection, Surgical   Other Topics Concern  . Not on file   Social History Narrative     Review of Systems  General:  No chills, fever, night sweats or weight changes.  Cardiovascular:  No chest pain, dyspnea on exertion, edema, orthopnea, palpitations, paroxysmal nocturnal dyspnea. Dermatological: No rash, lesions/masses Respiratory: No cough, dyspnea Urologic: No hematuria, dysuria Abdominal:   No nausea, vomiting, diarrhea, bright red blood per rectum, melena, or hematemesis Neurologic:  No visual changes, wkns, changes in mental status. All other systems reviewed and are otherwise negative except as noted above.  Physical Exam  Blood pressure 119/79, pulse 74, temperature 98.6 F (37 C),  temperature source Oral, resp. rate 16, height 5\' 5"  (1.651 m), weight 243 lb (110.224 kg), SpO2 99 %.  General: Pleasant, NAD Psych: Normal affect. Neuro: Alert and oriented X 3. Moves all extremities spontaneously. HEENT: Normal  Neck: Supple without bruits or JVD. Lungs:  Resp regular and unlabored, CTA. Heart: RRR no s3, s4, or murmurs. Abdomen: Soft, non-tender, non-distended, BS + x 4.  Extremities: No clubbing, cyanosis or edema. DP/PT/Radials 2+ and equal bilaterally.  Labs   Recent Labs  11/18/14 1000  TROPONINI <0.30   Lab Results  Component Value Date   WBC 6.3 11/18/2014   HGB 12.7 11/18/2014   HCT 37.4 11/18/2014   MCV 85.6 11/18/2014   PLT 229 11/18/2014    Recent Labs Lab 11/18/14 0139  NA 140  K 4.0  CL 106  CO2 20  BUN 7  CREATININE 0.74  CALCIUM 9.1  PROT 7.0  BILITOT 0.3  ALKPHOS 77  ALT 18  AST 17  GLUCOSE 91   Lab Results  Component Value Date   LDLCALC 111* 01/20/2014   TRIG 91 01/20/2014   Lab Results  Component Value Date   DDIMER <0.27 11/18/2014   Invalid input(s): POCBNP  Radiology/Studies  Dg Chest 2 View  11/09/2014   CLINICAL DATA:  Cough, shortness of breath and fever. Initial encounter.  EXAM: CHEST  2 VIEW  COMPARISON:  10/28/2014  FINDINGS: The cardiomediastinal silhouette is unremarkable.  There is no evidence of focal airspace disease, pulmonary edema, suspicious pulmonary nodule/mass, pleural effusion, or pneumothorax. No acute bony abnormalities are identified.  IMPRESSION: No active cardiopulmonary disease.   Electronically Signed   By: Hassan Rowan M.D.   On: 11/09/2014 23:24   Dg Chest 2 View  10/28/2014   CLINICAL DATA:  Cough. Shortness of breath. Mid chest pain for 1 week, worsening over the past few days. History of asthma. Previous smoker.  EXAM: CHEST  2 VIEW  COMPARISON:  08/02/2014  FINDINGS: The heart size and mediastinal contours are within normal limits. Both lungs are clear. The visualized skeletal  structures are unremarkable.  IMPRESSION: No active cardiopulmonary disease.   Electronically Signed   By: Lucienne Capers M.D.   On: 10/28/2014 01:18    Echocardiogram  -02/11/2014 Left ventricle: The cavity size was normal. Wall thickness was normal. Systolic function was normal. The estimated ejection fraction was in the range of 60% to 65%. Wall motion was normal; there were no regional wall motion abnormalities. Left ventricular diastolic function parameters were normal. - Inferior vena cava: The vessel was normal in size; the respirophasic diameter changes were in the normal range (=  50%); findings are consistent with normal central venous pressure. - Pericardium, extracardiac: The pericardium was normal in appearance. There was no pericardial effusion.  ECG: NSR, normal ECG     ASSESSMENT AND PLAN  38 year old female with seizure disorder, asthma, obesity, h/o pericarditis in 02/2014 admitted with N/V, epigastric pain and GI bleed.  She has no evidence of ongoing pericarditis, colchicine should be discontinued. Avoid NSAIDS. BP controlled. LVEF normal. No need for further work up from cardiology standpoint.   Signed, Dorothy Spark, MD, [facility] 11/18/2014, 12:34 PM

## 2014-11-18 NOTE — H&P (Signed)
[facility] Admission History and Physical Service Pager: [phone]  Patient name: CLEMIE GENERAL Medical record number: [id number] Date of birth: [birth year] Age: 38 y.o. Gender: female  Primary Care Provider: Tawanna Sat, MD Consultants: none Code Status: FULL (discussed on admission)  Chief Complaint: nausea, vomiting, weakness, SOB  Assessment and Plan: SUZETTA TIMKO is a 38 y.o. female presenting with nausea, vomiting, subjective blood in stool, and LLE TTP . PMH is significant for seizures, migraine, acute pericarditis  GI: Patient with N/V/ possible blood in stool/possible bloody vomitus.  Afebrile.  Gastritis vs hemorrhoid vs flu -Admit to FMTS for observation under Dr Gwendlyn Deutscher, med-surg  -Vitals per floor protocol  -FOBT, gastrooccult   -Consider CT abdomen -Zofran PRN N/V -NPO - No need to repeat labs at this point, as basically no abnormalities found initially several hours ago.  May ADAT pending how she feels and her vitals are - NS @ 125 cc/hr until improving PO intake  MSK: Weakness/LLE pain.  TTP out of proportion to exam.  Weakness vs non-motivated patient.  Possible secondary gain, as disability was mentioned during exam.  No evidence of deconditioning.  Things to consider MS vs fibromyalgia vs psychosomatic d/o vs flu (not febrile).  Possible DVT with unilateral TTP.  Prior studies (08/2014) in RLE without evidence of DVT. -Venous doppler LLE -May need to start some type of anticoagulant for DVT prophylaxis, pending results of duplex above and length of stay.  However, in the setting of possible rectal bleeding and red tinged vomit will hold off.  No SCDs at this time d/t possible DVT. -PT evaluation  Pulm: SOB on ambulation.  Patient stable on RA on exam.  Lungs CTAB. No h/o of lung disease. -Albuterol PRN SOB  Neuro: h/o migraine. h/o seizures.  Patient with seizure activity witnessed by husband last night x2-83min episodes.   -Continue  home [place] home Percocet, patient not having headache currently. -Patient needs to establish with neurologist outpatient  Cardio: h/o Pericarditis.  Seen by Dr Meda Coffee.   -Continue colchicine -hold indomethacin for now d/t bleed risk  FEN/GI: MIVF NS, NPO, Protonix Prophylaxis: none for now SCDs vs anticoag  Disposition: Discharge pending improvement in symptoms  History of Present Illness: VIVIANN BROYLES is a 38 y.o. female presenting with nausea, vomiting, weakness, SOB on exertion, subjective bloody stools and vomitus and LLE tenderness.  PMH includes seizures, migraines, pericarditis, OSA.    Patient reports that nausea and vomiting began about 2 days ago.  She states that it was initially food in her vomit but that over the last day it has become red tinged and her throat is scratchy feeling.  She denies drinking any products that are red in color, undercooked foods, fevers, diarrhea or sick contacts.  In addition, she notes that she thinks she may also have rectal bleeding as she observed blood on toilet paper after stooling this morning.  Denies h/o rectal bleeding, hemorrhoids, constipation.  Patient also reports that she has had about a 2-3 month h/o of increasing weakness with falls about 2 days ago.  She reports not being able to ambulate down stairs well.  She states that she is not able to stand for prolonged periods of time and has been having to sit down during 8 hour shifts at work.  She also endorses SOB with ambulation over the last 2-3 months.  She has never had symptoms like these before.  She does report that she sees Dr Meda Coffee,  cardiologist, for pericarditis and is on some pills that she cannot recall the name of.  She reports several URI's over the last several months.  Denies trauma, CP.  Admits to swelling and pain in her L calf.  Admits to difficulty sleeping at night and sleep apnea.  Denies trouble laying flat.  Patient endorses 2 seizures that were witnessed by  husband last night around 10:30pm.  He states that she had mild tonic clonic activity x27mins.  Then, he states he witnessed another episode of more pronounced tonic clonic seizure activity around 11:30pm.  Patient and husband deny loss of bowel or bladder function or hitting of her head.  Patient denies dysuria, fevers, hematuria.  Seizure activity resolved spontaneously.  Patient is on [place] for seizure prophylaxis at home.  She reports being compliant with medication.  She is not established with a neurologist currently.  Last seizure reported to be 05/2014.  In ED, CBC nml, BMET nml, UA with leukocytes & bacteria.  Upreg negative.  Review Of Systems: Per HPI with the following additions: none Otherwise 12 point review of systems was performed and was unremarkable.  Patient Active Problem List   Diagnosis Date Noted  . Weakness 11/18/2014  . OSA (obstructive sleep apnea) 10/21/2014  . Acute rhinosinusitis 10/09/2014  . Leg pain, lateral 09/04/2014  . Migraines 08/13/2014  . Seizure 06/04/2014  . Leiomyoma of uterus, unspecified 02/11/2014  . GERD (gastroesophageal reflux disease) 01/28/2014  . Candidiasis of vulva and vagina 01/28/2014  . Unspecified symptom associated with female genital organs 01/28/2014  . Chest pain 01/20/2014  . Acute pericarditis, unspecified 01/20/2014   Past Medical History: Past Medical History  Diagnosis Date  . Anemia   . Asthma   . Blood transfusion 2011    r/t anemia  . Migraine   . Sickle cell trait   . Seizures     last sz Jan 2012  . Seizures   . Fibroid   . Tooth abscess   . Sickle cell trait   . Migraine    Past Surgical History: Past Surgical History  Procedure Laterality Date  . Tubal ligation Bilateral 2005   Social History: History  Substance Use Topics  . Smoking status: Former Smoker    Quit date: 03/27/2014  . Smokeless tobacco: Never Used  . Alcohol Use: No   Additional social history: former smoker, no ETOH, no illicit  drug use  Please also refer to relevant sections of EMR.  Family History: Family History  Problem Relation Age of Onset  . Anesthesia problems Neg Hx   . Hypertension Mother   . Diabetes Mother   . Cancer Mother     Colon, brain, 2 other  . Asthma Mother   . Clotting disorder Mother   . Heart disease Mother     37, stents  . Stroke Mother     2012  . Diabetes Father   . Asthma Father   . Clotting disorder Father   . Sickle cell anemia Father   . Heart disease Father     2000, stents  . Hypertension Father   . Stroke Father     2007  . Diabetes Maternal Grandmother   . Asthma Sister   . Asthma Brother   . Asthma Paternal Grandmother    Allergies and Medications: Allergies  Allergen Reactions  . Penicillins Swelling  . Sulfa Antibiotics Rash   No current facility-administered medications on file prior to encounter.   Current Outpatient Prescriptions on File  Prior to Encounter  Medication Sig Dispense Refill  . albuterol (PROVENTIL HFA;VENTOLIN HFA) 108 (90 BASE) MCG/ACT inhaler Inhale 2 puffs into the lungs every 4 (four) hours as needed for wheezing or shortness of breath (or coughing). 1 Inhaler 0  . colchicine 0.6 MG tablet Take 0.6 mg by mouth 2 (two) times daily.    . diphenhydrAMINE (BENADRYL) 25 mg capsule Take 25 mg by mouth every 4 (four) hours as needed for allergies.    . fexofenadine (ALLEGRA) 180 MG tablet Take 1 tablet (180 mg total) by mouth daily. 30 tablet 3  . ibuprofen (ADVIL,MOTRIN) 200 MG tablet Take 600 mg by mouth every 6 (six) hours as needed for moderate pain.     . indomethacin (INDOCIN) 50 MG capsule Take 1 capsule (50 mg total) by mouth 3 (three) times daily with meals. 90 capsule 3  . ipratropium (ATROVENT) 0.06 % nasal spray Place 2 sprays into both nostrils 4 (four) times daily. (Patient taking differently: Place 2 sprays into both nostrils 4 (four) times daily as needed for rhinitis. ) 15 mL 0  . levETIRAcetam (KEPPRA) 500 MG tablet Take  1 tablet (500 mg total) by mouth 2 (two) times daily. 60 tablet 0  . medroxyPROGESTERone (DEPO-PROVERA) 150 MG/ML injection Inject 150 mg into the muscle every 3 (three) months. Inject on  the 22nd of each month    . omeprazole (PRILOSEC) 20 MG capsule Take 1 capsule (20 mg total) by mouth 2 (two) times daily before a meal. 60 capsule 5  . ondansetron (ZOFRAN ODT) 8 MG disintegrating tablet Take 1 tablet (8 mg total) by mouth every 8 (eight) hours as needed for nausea or vomiting. 10 tablet 0  . oxyCODONE-acetaminophen (PERCOCET/ROXICET) 5-325 MG per tablet Take 1 tablet by mouth every 8 (eight) hours as needed for severe pain.    . Prenatal Vit-Fe Fumarate-FA (PNV PRENATAL PLUS MULTIVITAMIN) 27-1 MG TABS Take 1 tablet by mouth daily.    Marland Kitchen azithromycin (ZITHROMAX Z-PAK) 250 MG tablet Take 2 tablets on Day 1. Take 1 tablet daily for 4 days. (Patient not taking: Reported on 11/10/2014) 6 tablet 0  . HYDROcodone-homatropine (HYCODAN) 5-1.5 MG/5ML syrup Take 5 mLs by mouth every 8 (eight) hours as needed for cough. (Patient not taking: Reported on 10/27/2014) 120 mL 0  . ibuprofen (ADVIL,MOTRIN) 600 MG tablet Take 1 tablet (600 mg total) by mouth every 6 (six) hours as needed. (Patient not taking: Reported on 10/27/2014) 30 tablet 0  . indomethacin (INDOCIN) 25 MG capsule Take 1 capsule (25 mg total) by mouth 3 (three) times daily with meals. May take up to 50mg  three times a day if no improvement with 25mg . (Patient not taking: Reported on 10/27/2014) 30 capsule 0  . levofloxacin (LEVAQUIN) 500 MG tablet Take 1 tablet (500 mg total) by mouth daily. (Patient not taking: Reported on 11/18/2014) 7 tablet 0  . predniSONE (DELTASONE) 50 MG tablet Take 1 tablet (50 mg total) by mouth daily. (Patient not taking: Reported on 11/18/2014) 5 tablet 0    Objective: BP 101/65 mmHg  Pulse 65  Resp 19  SpO2 98% Exam: General: patient drowsy appearing, lying in bed, NAD, family at bedside HEENT: [place]/AT, EOMI, PERRLA,  o/p clear Cardiovascular: RRR, cap refill brisk Respiratory: CTAB, no increased WOB, no wheeze Abdomen: obese, soft, ND, +TTP RLQ & LLQ but upon distraction, no TTP, no guarding or rebound, no peritoneal signs Extremities: WWP, no cyanosis, clubbing or edema  LLE: skin extremely sensitive. Pain questionably  out of proportion to exam.  Was not able to perform homan's d/t sensitivity.  LE's appeared equal in size, with no obvious edema of either extremity.  No erythema appreciated. Skin: no rashes or lesions Neuro: AAOx3, CN2-12 grossly intact, patient able to wiggle toes but unable to move against gravity. 2/5 LE strength.  Light touch sensation intact.  Speech normal.  Labs and Imaging: CBC BMET   Recent Labs Lab 11/18/14 0139  WBC 6.3  HGB 12.7  HCT 37.4  PLT 229    Recent Labs Lab 11/18/14 0139  NA 140  K 4.0  CL 106  CO2 20  BUN 7  CREATININE 0.74  GLUCOSE 91  CALCIUM 9.1     Urinalysis    Component Value Date/Time   COLORURINE YELLOW 11/18/2014 0348   APPEARANCEUR CLOUDY* 11/18/2014 0348   LABSPEC 1.025 11/18/2014 0348   PHURINE 6.0 11/18/2014 0348   GLUCOSEU NEGATIVE 11/18/2014 0348   HGBUR NEGATIVE 11/18/2014 0348   BILIRUBINUR NEGATIVE 11/18/2014 0348   KETONESUR NEGATIVE 11/18/2014 0348   PROTEINUR NEGATIVE 11/18/2014 0348   UROBILINOGEN 0.2 11/18/2014 0348   NITRITE NEGATIVE 11/18/2014 0348   LEUKOCYTESUR MODERATE* 11/18/2014 0348    [name], DO 11/18/2014, 5:55 AM PGY-1, [facility] pager: [phone], text pages welcome  I have seen and examined the pt with Dr. Lajuana Ripple.  Please refer to her excellent H&P above for further information.  My additions are in red.  Tamela Oddi Awanda Mink, DO of [facility] 11/18/2014, 7:16 AM

## 2014-11-18 NOTE — Evaluation (Signed)
Physical Therapy Evaluation Patient Details Name: Nicole [name] MRN: [id number] DOB: 09-10-1976 Today's Date: 11/18/2014   History of Present Illness  38 Y/O F with PMX of GERD, Seizure disorder, OSA,Migraine headache presented with 3 days hx of N/V and diarrhea. She vomites at least 4 times a day, denies blood in her vomitus but there was blood in her stool yesterday, there is associated epigastric pain and lower abdominal pain, denies urinary symptoms. Had fever at home. For the last 3 days she has generalized weakness mostly of her UL and LL B/L, she fell at home few times, she is unable to stand due to weakness. She developed LL pain few days ago especially around her calf area, denies trauma to her LL. She stated she had seizure yesterday, last seizure prior to then was months ago, she is compliant with her medications.  Clinical Impression  Pt admitted with LLE weakness, decreased trunk control, inability to mobilize OOB without assistance. Pt currently with functional limitations due to the deficits listed below (see PT Problem List). Pt transferred bed to chair with +2 mod A, unable to safely ambulate today. Frustrated by intense pain in LLE, to touch and with mvmt. Pt will benefit from skilled PT to increase their independence and safety with mobility to allow discharge to the venue listed below. PT will continue to follow.       Follow Up Recommendations CIR;Supervision/Assistance - 24 hour    Equipment Recommendations  Other (comment) (TBD)    Recommendations for Other Services Rehab consult;OT consult     Precautions / Restrictions Precautions Precautions: Fall Precaution Comments: fell today in hospital room when tried to get up to go to the bathroom alone Restrictions Weight Bearing Restrictions: No      Mobility  Bed Mobility Overal bed mobility: Needs Assistance Bed Mobility: Supine to Sit     Supine to sit: Mod assist     General bed mobility comments: pt had  great difficulty getting left leg off bed but did not want it to be touched by therapist or herself, eventually able to adduct it enough to slide it off bed. Mod A to support pt as she scooted to EOB due to posterior LOB  Transfers Overall transfer level: Needs assistance Equipment used: Rolling walker (2 wheeled);2 person hand held assist Transfers: Sit to/from [facility] Sit to Stand: +2 physical assistance;Mod assist Stand pivot transfers: +2 physical assistance;Mod assist       General transfer comment: transferred to right 2x, bed to [facility] and BSC to recliner. First transfer was squat pivot with +2 mod A for support as pt able to take wt through RLE but significant wt and increased pain on left LE. RW used for pt to stand to wipe self after using BSC and then sued to pivot to char, full stand achieved with second transfer. Being able to use stength of UE's on RW helped but pt still very painful LLE  Ambulation/Gait             General Gait Details: unable to safely ambulate today  Stairs            Wheelchair Mobility    Modified Rankin (Stroke Patients Only)       Balance Overall balance assessment: Needs assistance Sitting-balance support: Bilateral upper extremity supported;Feet supported Sitting balance-Leahy Scale: Poor Sitting balance - Comments: sudden posterior lean, decreased trunk control and spatial awareness Postural control: Posterior lean Standing balance support: Bilateral upper extremity supported;During functional activity  Standing balance-Leahy Scale: Zero                               Pertinent Vitals/Pain Pain Assessment: Faces Faces Pain Scale: Hurts worst Pain Location: left leg from toes to hip, extreme pain to touch or when pt moves Pain Descriptors / Indicators: Burning Pain Intervention(s): Monitored during session;Repositioned;Limited activity within patient's tolerance    Home Living Family/patient  expects to be discharged to:: Private residence Living Arrangements: Spouse/significant other;Children Available Help at Discharge: Family;Available PRN/intermittently Type of Home: House Home Access: Level entry     Home Layout: One level Home Equipment: None Additional Comments: pt's 40 yo niece lives with her, pt works at [employer], standing and walking most of day and reports that she had begun having difficulty doing this due to fatigue. She also reports that in the past few days she began having LLE pain and mental status changes, ie forgetfulness    Prior Function Level of Independence: Independent               Hand Dominance        Extremity/Trunk Assessment   Upper Extremity Assessment: Overall WFL for tasks assessed           Lower Extremity Assessment: RLE deficits/detail;LLE deficits/detail RLE Deficits / Details: hip flex 2/5, knee ext 3/5, ankle 3/5 LLE Deficits / Details: hip flex 1/5, knee ext 2-/5, ankle 1/5  Cervical / Trunk Assessment: Normal  Communication   Communication: No difficulties  Cognition Arousal/Alertness: Awake/alert Behavior During Therapy: Anxious Overall Cognitive Status: Impaired/Different from baseline Area of Impairment: Safety/judgement         Safety/Judgement: Decreased awareness of deficits;Decreased awareness of safety     General Comments: decreased proprioception and spatial awareness, fell over seveal times sitting EOB and didn't realize she was losing her balance until it was too late and therapist caught her    General Comments      Exercises        Assessment/Plan    PT Assessment Patient needs continued PT services  PT Diagnosis Difficulty walking;Acute pain;Hemiplegia non-dominant side   PT Problem List Decreased strength;Decreased range of motion;Decreased activity tolerance;Decreased balance;Decreased mobility;Decreased coordination;Decreased cognition;Decreased safety awareness;Decreased  knowledge of use of DME;Decreased knowledge of precautions;Pain  PT Treatment Interventions DME instruction;Gait training;Functional mobility training;Therapeutic activities;Therapeutic exercise;Balance training;Neuromuscular re-education;Cognitive remediation;Patient/family education;Wheelchair mobility training   PT Goals (Current goals can be found in the Care Plan section) Acute Rehab PT Goals Patient Stated Goal: return to home and work PT Goal Formulation: With patient Time For Goal Achievement: 12/02/14 Potential to Achieve Goals: Good    Frequency Min 3X/week   Barriers to discharge Decreased caregiver support      Co-evaluation               End of Session Equipment Utilized During Treatment: Gait belt Activity Tolerance: Patient limited by pain Patient left: in chair;with chair alarm set;with call bell/phone within reach Nurse Communication: Mobility status (transfer to right)    Functional Assessment Tool Used: clinical judgement Functional Limitation: Mobility: Walking and moving around Mobility: Walking and Moving Around Current Status (Z6109): At least 80 percent but less than 100 percent impaired, limited or restricted Mobility: Walking and Moving Around Goal Status [phone]): At least 20 percent but less than 40 percent impaired, limited or restricted    Time: 1429-1456 PT Time Calculation (min) (ACUTE ONLY): 27 min   Charges:  PT Evaluation $Initial PT Evaluation Tier I: 1 Procedure PT Treatments $Therapeutic Activity: 23-37 mins   PT G Codes:   Functional Assessment Tool Used: clinical judgement Functional Limitation: Mobility: Walking and moving around  [facility], PT  Acute Rehab Services  [place], [place] 11/18/2014, 4:54 PM

## 2014-11-18 NOTE — Progress Notes (Signed)
Nicole [name] [id number] Admission Data: 11/18/2014 9:05 AM Attending Provider: Andrena Mews, MD  EVO:[id number], Mitzi Hansen, MD Consults/ Treatment Team:    [name] is a 38 y.o. female patient admitted from ED awake, alert  & orientated  X 3,  Full Code, VSS - Blood pressure 95/62, pulse 61, temperature 98.3 F (36.8 C), temperature source Oral, resp. rate 18, height 5\' 5"  (1.651 m), weight 110.224 kg (243 lb), SpO2 98 %.,no c/o chest pain, no distress noted.     Allergies:   Allergies  Allergen Reactions  . Penicillins Swelling  . Sulfa Antibiotics Rash     Past Medical History  Diagnosis Date  . Anemia   . Asthma   . Blood transfusion 2011    r/t anemia  . Migraine   . Sickle cell trait   . Seizures     last sz Jan 2012  . Seizures   . Fibroid   . Tooth abscess   . Sickle cell trait   . Migraine      Pt orientation to unit, room and routine. Information packet given to patient/family and safety video watched.  Admission INP armband ID verified with patient/family, and in place. SR up x 2, fall risk assessment complete with Patient and family verbalizing understanding of risks associated with falls. Pt verbalizes an understanding of how to use the call bell and to call for help before getting out of bed.  Skin, clean-dry- intact without evidence of bruising, or skin tears.   No evidence of skin break down noted on exam.     Will cont to monitor and assist as needed.  Dayle Points, RN 11/18/2014 9:05 AM

## 2014-11-18 NOTE — Progress Notes (Signed)
@   12:09 Called by nursing staff reporting pt fell from bed. I came to assess pt @12 :20. Patient laying in bed in NAD. She states she called for nursing multiple times. When no response came she decided to get up out of bed. She fell upon getting OOB. Denies any dizziness, LOC. Remembers entire event. Did not strike head or neck. Continues to c/o pain in LLE pain and weakness. Patient denies any change in status since this morning.  General -- oriented x3, NAD, possible slight delayed affect HEENT -- Head is normocephalic. Eyes, pupils equal and round and react to light and accommodation. Extraocular motions are intact. Bilateral mild ptosis present Neck -- supple Integument -- intact. No rash, erythema, or ecchymoses.  Chest -- good expansion. Lungs clear to auscultation. Cardiac -- RRR. No murmurs noted.  CNS -- cranial nerves II through XII grossly intact. Musculoskeletal - exquisite tenderness present over LLE below knee (out of proportion). No effusions noted. ROM good. 3/5 bilateral LE strength (hip flexor and knee extensors). Dorsalis pedis pulses present and symmetrical.   Discussed with nursing. We agree that patient will need regular f/u and may need added attention to prevent noncompliance w/ OOB orders.  Up w/ assistance only.

## 2014-11-18 NOTE — ED Notes (Signed)
Pt is aware urine is needed for testing, pt is unable to urinate at this time.  

## 2014-11-18 NOTE — Progress Notes (Addendum)
Was the fall witnessed: No  Patient condition before and after the fall: WDL, pt was last checked on at 1100am. Pt. Was sleeping at the time of rounds, awakened and given medicine (May refer to [facility]).  Patient's reaction to the fall: Pt. Is ok, no signs of distress noted at this time. No skin tears or bruising noted at this time.    Name of the doctor that was notified including date and time:Paged [phone] at 1141/[place] Any interventions and vital signs:pt VS stable and patient denies hitting anything.  She stated her left leg was weak and gave out and that's why she fell.  Patient found on here bottom.  Pt. Had rang her call bell prior to fall. Per Network engineer pt. Stated prior to fall "I have to use the bathroom, and I'm not waiting for help, if I fall then I fall". Nurse tech had responded promptly to call bell, and when nurse tech arrived, pt. Was found sitting on the floor with the bed behind her.

## 2014-11-18 NOTE — Progress Notes (Signed)
**  Interval note**  C/s to cardiology and neurology placed.  Patient with softening pressures on floor to 95/62.  Patient is being hydrated with MIVF presently.  Consider bolus if pressures continue to drop.  Also, keppra level ordered.  Yaretzy Olazabal M. Lajuana Ripple, DO PGY-1, [facility] 11/18/14, 11:10am

## 2014-11-19 DIAGNOSIS — D573 Sickle-cell trait: Secondary | ICD-10-CM | POA: Diagnosis present

## 2014-11-19 DIAGNOSIS — J45909 Unspecified asthma, uncomplicated: Secondary | ICD-10-CM | POA: Diagnosis present

## 2014-11-19 DIAGNOSIS — D649 Anemia, unspecified: Secondary | ICD-10-CM | POA: Diagnosis present

## 2014-11-19 DIAGNOSIS — R531 Weakness: Secondary | ICD-10-CM | POA: Diagnosis present

## 2014-11-19 DIAGNOSIS — G40409 Other generalized epilepsy and epileptic syndromes, not intractable, without status epilepticus: Secondary | ICD-10-CM | POA: Diagnosis present

## 2014-11-19 DIAGNOSIS — M79609 Pain in unspecified limb: Secondary | ICD-10-CM

## 2014-11-19 DIAGNOSIS — K219 Gastro-esophageal reflux disease without esophagitis: Secondary | ICD-10-CM | POA: Diagnosis present

## 2014-11-19 DIAGNOSIS — G43909 Migraine, unspecified, not intractable, without status migrainosus: Secondary | ICD-10-CM | POA: Diagnosis present

## 2014-11-19 DIAGNOSIS — Z87891 Personal history of nicotine dependence: Secondary | ICD-10-CM | POA: Diagnosis not present

## 2014-11-19 DIAGNOSIS — G4733 Obstructive sleep apnea (adult) (pediatric): Secondary | ICD-10-CM | POA: Diagnosis present

## 2014-11-19 DIAGNOSIS — I319 Disease of pericardium, unspecified: Secondary | ICD-10-CM | POA: Diagnosis present

## 2014-11-19 LAB — TSH: TSH: 1.67 u[IU]/mL (ref 0.350–4.500)

## 2014-11-19 MED ORDER — TRAMADOL HCL 50 MG PO TABS
50.0000 mg | ORAL_TABLET | Freq: Once | ORAL | Status: AC
  Administered 2014-11-19: 50 mg via ORAL
  Filled 2014-11-19: qty 1

## 2014-11-19 MED ORDER — OXYCODONE HCL 5 MG PO TABS
5.0000 mg | ORAL_TABLET | Freq: Once | ORAL | Status: AC
  Administered 2014-11-19: 5 mg via ORAL
  Filled 2014-11-19: qty 1

## 2014-11-19 MED ORDER — TRAMADOL HCL 50 MG PO TABS
50.0000 mg | ORAL_TABLET | Freq: Four times a day (QID) | ORAL | Status: DC | PRN
  Administered 2014-11-19 – 2014-11-20 (×3): 50 mg via ORAL
  Filled 2014-11-19 (×4): qty 1

## 2014-11-19 MED ORDER — DOCUSATE SODIUM 100 MG PO CAPS
100.0000 mg | ORAL_CAPSULE | Freq: Every day | ORAL | Status: DC
  Administered 2014-11-19 – 2014-11-20 (×2): 100 mg via ORAL
  Filled 2014-11-19 (×2): qty 1

## 2014-11-19 MED ORDER — HYDROXYZINE HCL 10 MG PO TABS
10.0000 mg | ORAL_TABLET | Freq: Once | ORAL | Status: AC
  Administered 2014-11-19: 10 mg via ORAL
  Filled 2014-11-19: qty 1

## 2014-11-19 NOTE — Progress Notes (Signed)
Family Medicine Teaching Service Daily Progress Note Intern Pager: [phone]  Patient name: Nicole [name] Medical record number: [id number] Date of birth: Mar 06, 1976 Age: 38 y.o. Gender: female  Primary Care Provider: Tawanna Sat, MD Consultants: Neurology, Cardiology, CIR Code Status: FULL  Pt Overview and Major Events to Date:  none  Assessment and Plan: Nicole PFAHLER is a 38 y.o. female presenting with nausea, vomiting, subjective blood in stool, and LLE TTP . PMH is significant for seizures, migraine, acute pericarditis  GI: Patient with N/V/ possible blood in stool/possible bloody vomitus. Afebrile. Patient without vomitus this morning.  Tolerating breakfast. -Vitals per floor protocol  -FOBT, gastroccultnot obtained yet.  -Zofran PRN N/V -NS @ 125 cc/hr until improving PO intake -colace daily ordered  MSK: Weakness/LLE pain. TTP out of proportion to exam.Things to consider MS vs fibromyalgia vs psychosomatic d/o vs flu (not febrile). Possible DVT with unilateral TTP. Prior studies (08/2014) in RLE without evidence of DVT. -Venous doppler LLE still not performed -May need to start some type of anticoagulant for DVT prophylaxis, pending results of duplex above and length of stay. However, in the setting of possible rectal bleeding and red tinged vomit will hold off. No SCDs at this time d/t possible DVT. -PT evaluation: recommends CIR -c/s CIR this am -Obtain TSH -oxy 5 IR x1 for pain. ?neuropathic pain> neurontin   Pulm: SOB on ambulation. Patient stable on RA on exam. Lungs CTAB. No h/o of lung disease. -Albuterol PRN SOB  Neuro: h/o migraine. h/o seizures. Patient with seizure activity witnessed by husband last night x2-52min episodes.  -Neuro evaluated yesterday.  Increased Keppra to 750mg  BID -Hold home Percocet, patient not having headache currently. -Patient needs to establish with neurologist outpatient  Cardio: h/o Pericarditis. Seen by Dr  Meda Coffee.BP stable overnight  -Cardiology evaluated yesterday.  D/c colchicine 12/09 & avoid NSAIDs -pericarditis resolved.  FEN/GI: MIVF NS, Regular diet, Protonix Prophylaxis: Heparin sub-q  Disposition: Discharge to CIR pending evaluation and continued stabilization  Subjective:  Patient reports that she is feeling better this am.  Denies vomiting.  Admits to mild nausea with some foods but able to tolerate breakfast well this am.  She denies any stools.  States that she has these maybe once monthly.  Denies abdominal pain.  Patient reports weakness is improving minimally.  She endorses burning pain that goes down her Left leg.  Endorses continued weakness in that leg as well.  Objective: Temp:  [98.5 F (36.9 C)-98.6 F (37 C)] 98.5 F (36.9 C) (12/10 0617) Pulse Rate:  [74-85] 74 (12/10 0617) Resp:  [16-18] 18 (12/10 0617) BP: (105-119)/(67-79) 105/67 mmHg (12/10 0617) SpO2:  [97 %-99 %] 97 % (12/10 0617) Physical Exam: General: patient drowsy appearing, lying in bed, NAD, family at bedside HEENT: [place]/AT, EOMI, PERRLA, o/p clear Cardiovascular: RRR, cap refill brisk Respiratory: CTAB, no increased WOB, no wheeze Abdomen: obese, soft, ND, + mild TTP RLQ & LLQ (improving), no guarding or rebound, no peritoneal signs Extremities: WWP, no cyanosis, clubbing or edema LLE: skin extremely sensitive (improved from yesterday). pain with dorsiflexion of L foot.  LE's appeared equal in size, with no obvious edema of either extremity. No erythema appreciated. Skin: no rashes or lesions Neuro: AAOx3, CN2-12 grossly intact, patient able to wiggle toes but unable to move against gravity. 3/5 LE strength (R>L). Light touch sensation intact. Speech normal.  Laboratory:  Recent Labs Lab 11/18/14 0139  WBC 6.3  HGB 12.7  HCT 37.4  PLT 229  Recent Labs Lab 11/18/14 0139  NA 140  K 4.0  CL 106  CO2 20  BUN 7  CREATININE 0.74  CALCIUM 9.1  PROT 7.0  BILITOT 0.3   ALKPHOS 77  ALT 18  AST 17  GLUCOSE 91    No results found.   Imaging/Diagnostic Tests: none  Nicole Norlander, DO 11/19/2014, 8:47 AM PGY-1, [place] Intern pager: [phone], text pages welcome

## 2014-11-19 NOTE — Progress Notes (Signed)
*  Preliminary Results* Left lower extremity venous duplex completed. Study was very technically limited due to poor patient cooperation and pain level. There is no obvious evidence of deep vein thrombosis involving the left popliteal, posterior tibial, and peroneal veins. Unable to visualize the left thigh veins due to technical limitations. There is no evidence of left Baker's cyst.  11/19/2014 11:35 AM  Maudry Mayhew, RVT, RDCS, RDMS

## 2014-11-19 NOTE — Progress Notes (Signed)
UR completed 

## 2014-11-19 NOTE — Progress Notes (Signed)
Physical Therapy Treatment Patient Details Name: Nicole [name] MRN: [id number] DOB: Nov 08, 1976 Today's Date: 11/19/2014    History of Present Illness 38 Y/O F with PMX of GERD, Seizure disorder, OSA,Migraine headache presented with 3 days hx of N/V and diarrhea. She vomites at least 4 times a day, denies blood in her vomitus but there was blood in her stool yesterday, there is associated epigastric pain and lower abdominal pain, denies urinary symptoms. Had fever at home. For the last 3 days she has generalized weakness mostly of her UL and LL B/L, she fell at home few times, she is unable to stand due to weakness. She developed LL pain few days ago especially around her calf area, denies trauma to her LL. She stated she had seizure yesterday, last seizure prior to then was months ago, she is compliant with her medications.    PT Comments    Pt admitted with above. Pt currently with functional limitations due to severe pain in LLE limiting ability for pt to mobilize.  Pt reports severe pain to LT with pt jumping if touched on LLE.  Pt unable to move LLE either.  Pt did perform exercises with right LE and bil UEs for PT today to incr strength.  Felt it unsafe to move pt as her pain and pins and needles feeling in her LLE were too great to get pt OOB.    Pt will benefit from continued skilled PT to increase their independence and safety with mobility to allow discharge to the venue listed below.    Follow Up Recommendations  CIR;Supervision/Assistance - 24 hour     Equipment Recommendations  Other (comment) (TBA)    Recommendations for Other Services Rehab consult;OT consult     Precautions / Restrictions Precautions Precautions: Fall Restrictions Weight Bearing Restrictions: No    Mobility  Bed Mobility               General bed mobility comments: Pt too painful to move to EOB today.  Exercises in bed only.    Transfers                    Ambulation/Gait                  Stairs            Wheelchair Mobility    Modified Rankin (Stroke Patients Only)       Balance                                    Cognition Arousal/Alertness: Awake/alert Behavior During Therapy: Anxious Overall Cognitive Status: Impaired/Different from baseline Area of Impairment: Safety/judgement         Safety/Judgement: Decreased awareness of deficits;Decreased awareness of safety          Exercises General Exercises - Upper Extremity Shoulder Flexion: AROM;Both;10 reps;Supine (with HOB elevated) Shoulder ABduction: AROM;Both;5 reps;Supine Elbow Flexion: AROM;Both;10 reps;Supine Elbow Extension: AROM;Both;10 reps;Supine Wrist Flexion: AROM;Both;5 reps;Supine Wrist Extension: AROM;Both;5 reps;Supine General Exercises - Lower Extremity Ankle Circles/Pumps: AROM;Both;5 reps;Supine Quad Sets: AROM;Right;10 reps;Supine Heel Slides: AROM;Right;10 reps;Supine Hip ABduction/ADduction: AROM;Right;10 reps;Supine    General Comments        Pertinent Vitals/Pain Pain Assessment: Faces Faces Pain Scale: Hurts worst Pain Location: left leg toes to hip, extreme pain touch or move Pain Descriptors / Indicators: Burning;Pins and needles;Numbness Pain Intervention(s): Monitored during session;Limited activity within patient's tolerance;Repositioned;Patient  requesting pain meds-RN notified  VSS    Home Living                      Prior Function            PT Goals (current goals can now be found in the care plan section) Progress towards PT goals: Not progressing toward goals - comment (LLE pain incr today.)    Frequency  Min 3X/week    PT Plan Current plan remains appropriate    Co-evaluation             End of Session   Activity Tolerance: Patient limited by pain Patient left: in bed;with call bell/phone within reach;with bed alarm set     Time: 6270-3500 PT Time Calculation (min) (ACUTE ONLY): 18  min  Charges:  $Therapeutic Exercise: 8-22 mins                    G CodesIrwin Brakeman F 2014-12-08, 11:25 AM [facility] [phone] (pager)

## 2014-11-19 NOTE — Progress Notes (Signed)
Physical medicine and rehabilitation consult request received  Chart reviewed. Patient with known seizure disorder admitted for recurrent seizure as well as gastrointestinal illness yesterday. Based on diagnosis and timeframe, anticipate patient will not require inpatient rehabilitation. Please reconsult if patient does not progress with functional mobilities over the next 2-3 days.

## 2014-11-19 NOTE — Progress Notes (Signed)
Rehab Admissions Coordinator Note:  Patient was screened by Cleatrice Burke for appropriateness for an Inpatient Acute Rehab Consult per PT recommendation. At this time, we are recommending Inpatient Rehab consult. I wil place order.  Cleatrice Burke 11/19/2014, 9:40 AM  I can be reached at [phone].

## 2014-11-19 NOTE — Clinical Social Work Psychosocial (Signed)
Clinical Social Work Department BRIEF PSYCHOSOCIAL ASSESSMENT 11/19/2014  Patient:  Nicole [name], Nicole [name]     Account Number:  [id number]     Admit date:  11/18/2014  Clinical Social Worker:  Lovey Newcomer  Date/Time:  11/19/2014 03:38 PM  Referred by:  Physician  Date Referred:  11/19/2014 Referred for  SNF Placement   Other Referral:   NA   Interview type:  Patient Other interview type:   Patient alert and oriented at time of assessment.    PSYCHOSOCIAL DATA Living Status:  FAMILY Admitted from facility:   Level of care:   Primary support name:  Gwyndolyn Saxon Primary support relationship to patient:  SPOUSE Degree of support available:   Support is strong.    CURRENT CONCERNS Current Concerns  Post-Acute Placement   Other Concerns:   NA    SOCIAL WORK ASSESSMENT / PLAN CSW met with patient at bedside to complete assessment as PT has recommended CIR. CSW introduced self and explained role. Patient states that she is agreeable to SNF placement as backup to CIR but would prefer CIR. Patient reports that she lives at home with her husband and three children. Patient states that her children are grown and would be able to stay with her at home if necessary when discharged from SNF or CIR. CSW explained SNF search/placement process and answered patient's questions. Patient appeared to have flat affect throughout assessment, but was engaged. CSW will follow up with any available bed offers.   Assessment/plan status:  Psychosocial Support/Ongoing Assessment of Needs Other assessment/ plan:   Complete Fl2, Fax, PASRR   Information/referral to community resources:   CSW contact information and SNF list given.    PATIENT'S/FAMILY'S RESPONSE TO PLAN OF CARE: Patient states that she plans to DC to CIR is accepted for admission, but will consider going to SNF as a backup option. CSW will assist as appropriate.       Liz Beach MSW, [place], [place], [phone]

## 2014-11-20 DIAGNOSIS — K219 Gastro-esophageal reflux disease without esophagitis: Secondary | ICD-10-CM

## 2014-11-20 MED ORDER — SIMETHICONE 80 MG PO CHEW
80.0000 mg | CHEWABLE_TABLET | Freq: Once | ORAL | Status: AC
  Administered 2014-11-20: 80 mg via ORAL
  Filled 2014-11-20 (×2): qty 1

## 2014-11-20 MED ORDER — LEVETIRACETAM 750 MG PO TABS: 750.0000 mg | ORAL_TABLET | Freq: Two times a day (BID) | ORAL | Status: DC

## 2014-11-20 MED ORDER — GABAPENTIN 300 MG PO CAPS
300.0000 mg | ORAL_CAPSULE | Freq: Every day | ORAL | Status: DC
  Administered 2014-11-20: 300 mg via ORAL
  Filled 2014-11-20: qty 1

## 2014-11-20 MED ORDER — DSS 100 MG PO CAPS: 100.0000 mg | ORAL_CAPSULE | Freq: Every day | ORAL | Status: DC

## 2014-11-20 MED ORDER — GABAPENTIN 300 MG PO CAPS: ORAL_CAPSULE | ORAL | Status: DC

## 2014-11-20 MED ORDER — HYDROXYZINE HCL 10 MG PO TABS
10.0000 mg | ORAL_TABLET | Freq: Once | ORAL | Status: AC
  Administered 2014-11-20: 10 mg via ORAL
  Filled 2014-11-20: qty 1

## 2014-11-20 MED ORDER — GABAPENTIN 300 MG PO CAPS: 300.0000 mg | ORAL_CAPSULE | Freq: Every day | ORAL | Status: DC

## 2014-11-20 NOTE — Clinical Social Work Note (Signed)
CSW met with patient to present bed offers. Patient states that she has decided to return home at discharge with her husband and children. Patient states she will have needed assistance at home when discharged and reports that she will be transported by her husband. CSW has made RNCM aware.  CSW signing off.    Liz Beach MSW, [place], [place], [phone]

## 2014-11-20 NOTE — Progress Notes (Signed)
Family Medicine Teaching Service Daily Progress Note Intern Pager: [phone]  Patient name: Nicole [name] Medical record number: [id number] Date of birth: 08/22/1976 Age: 38 y.o. Gender: female  Primary Care Provider: Tawanna Sat, MD Consultants: Neurology, Cardiology, CIR Code Status: FULL  Pt Overview and Major Events to Date:  none  Assessment and Plan: Nicole [name] is a 38 y.o. female presenting with nausea, vomiting, subjective blood in stool, and LLE TTP. PMH is significant for seizures, migraine, acute pericarditis  GI: Patient with N/V/ possible blood in stool/possible bloody vomitus. Afebrile. Patient without vomitus this morning.  Tolerating breakfast. -Vitals per floor protocol  -FOBT, gastroccultnot obtained yet.  -Zofran PRN N/V -NS @ 75 cc/hr, PO intake improving.  Decreased MIVF to encourage patient to drink more PO. -colace daily  -gas-x ordered  MSK: Weakness/LLE pain. TTP out of proportion to exam.Things to consider MS vs fibromyalgia vs psychosomatic d/o vs flu (not febrile). Possible DVT with unilateral TTP. Prior studies (08/2014) in RLE without evidence of DVT. TSH normal -Venous doppler LLE still not performed -May need to start some type of anticoagulant for DVT prophylaxis, pending results of duplex above and length of stay. However, in the setting of possible rectal bleeding and red tinged vomit will hold off. No SCDs at this time d/t possible DVT. -PT evaluation: recommends CIR, CIR Provider does not think she needs IP rehab at this time. SNF discussed with patient.  Awaiting possible placement -Pain likely neuropathic pain in LLE. Start Neurontin 300mg  po qd x7, then bid x7, then tid. Discussed this with patient.  Patient amenable.   Pulm: SOB on ambulation. Patient stable on RA on exam. Lungs CTAB. No h/o of lung disease. -Albuterol PRN SOB  Neuro: h/o migraine. h/o seizures. Patient with seizure activity witnessed by husband the night prior  to admission x2-14min episodes.  -Continue Keppra to 750mg  BID -Hold home Percocet, patient not having headache currently. -Patient needs to establish with neurologist outpatient  Cardio: h/o Pericarditis. Seen by Dr Meda Coffee.BP stable overnight  -D/c colchicine & indomethacin 12/09 & avoid NSAIDs -pericarditis resolved per cards.  FEN/GI: MIVF NS, Regular diet, Protonix Prophylaxis: Heparin sub-q  Disposition: Discharge to SNF pending continued stabilization  Subjective:  Patient reports that weakness and pain is improving.  Oxy IR helped a great deal yesterday.  She reports that she has been performing the exercises given to her by PT.  She admits to some nausea with certain foods but denies vomiting.  Patient was able to tolerate breakfast this morning.  Denies any BM.  Endorses flatus and "feeling gassy" in general.  Patient also states she is a little itchy this morning.  Hydroxyzine helped last evening.  Objective: Temp:  [98.2 F (36.8 C)-98.6 F (37 C)] 98.2 F (36.8 C) (12/11 0529) Pulse Rate:  [79-80] 79 (12/11 0529) Resp:  [16-20] 16 (12/11 0529) BP: (100-113)/(58-79) 109/79 mmHg (12/11 0529) SpO2:  [99 %] 99 % (12/11 0529) Physical Exam: General: patient awake, alert, sitting up in bed, NAD, family at bedside HEENT: [place]/AT, EOMI, PERRLA, o/p clear Cardiovascular: RRR, cap refill brisk Respiratory: CTAB, no increased WOB, no wheeze Abdomen: obese, soft, ND, + mild TTP RLQ & LLQ (improving), no guarding or rebound, no peritoneal signs Extremities: WWP, no cyanosis, clubbing or edema LLE: skin sensitive (greatly improved from yesterday). pain with dorsiflexion of L foot (improving). FROM RLE, LLE ROM limited by pain but still improved compared to yesterday.  LE's appeared equal in size, with no obvious edema  of either extremity. No erythema appreciated. Skin: no rashes or lesions Neuro: AAOx3, CN2-12 grossly intact, no cerebellar deficits on UE and LE  testing. 4/5 LE strength (R>L). Light touch sensation intact. Speech normal.  Laboratory:  Recent Labs Lab 11/18/14 0139  WBC 6.3  HGB 12.7  HCT 37.4  PLT 229    Recent Labs Lab 11/18/14 0139  NA 140  K 4.0  CL 106  CO2 20  BUN 7  CREATININE 0.74  CALCIUM 9.1  PROT 7.0  BILITOT 0.3  ALKPHOS 77  ALT 18  AST 17  GLUCOSE 91   D-Dimer: <0.27 TSH: 1.670 Troponins negative x3 Keppra level pending  Imaging/Diagnostic Tests: Doppler LLE: negative for DVT  [name], DO 11/20/2014, 9:22 AM PGY-1, [place] Intern pager: [phone], text pages welcome

## 2014-11-20 NOTE — Plan of Care (Signed)
Problem: Phase II Progression Outcomes Goal: Vital signs remain stable Outcome: Progressing     

## 2014-11-20 NOTE — Discharge Summary (Signed)
[facility] Discharge Summary  Patient name: Nicole [name] Medical record number: [id number] Date of birth: Apr 11, 1976 Age: 38 y.o. Gender: female Date of Admission: 11/18/2014  Date of Discharge: 11/20/14 Admitting Physician: Andrena Mews, MD  Primary Care Provider: Tawanna Sat, MD Consultants: Neurology, Cardiology  Indication for Hospitalization: generalized weakness, nausea, vomiting  Discharge Diagnoses/Problem List:  Generalize Weakness GERD Seizure OSA  Disposition: Discharge to SNF  Discharge Condition: Stable  Discharge Exam:  BP 90/55 mmHg  Pulse 79  Temp(Src) 97.4 F (36.3 C) (Oral)  Resp 16  Ht 5\' 5"  (1.651 m)  Wt 243 lb (110.224 kg)  BMI 40.44 kg/m2  SpO2 97% General: patient awake, alert, sitting up in bed, NAD, family at bedside HEENT: [place]/AT, EOMI, PERRLA, o/p clear Cardiovascular: RRR, cap refill brisk Respiratory: CTAB, no increased WOB, no wheeze Abdomen: obese, soft, ND, + mild TTP RLQ & LLQ (improving), no guarding or rebound, no peritoneal signs Extremities: WWP, no cyanosis, clubbing or edema LLE: skin sensitive (greatly improved from yesterday). pain with dorsiflexion of L foot (improving). FROM RLE, LLE ROM limited by pain but still improved compared to yesterday. LE's appeared equal in size, with no obvious edema of either extremity. No erythema appreciated. Skin: no rashes or lesions Neuro: AAOx3, CN2-12 grossly intact, no cerebellar deficits on UE and LE testing. 4/5 LE strength (R>L). Light touch sensation intact. Speech normal.  Brief Hospital Course:  38 Y/O F with PMX of GERD, Seizure disorder, OSA, Migraine headache that presented with 3 days hx of N/V and diarrhea. She developed LL pain few days prior to admission especially around her calf area.  She also reported having a seizure the evening prior to admission.  In ED, CBC nml, BMET nml, UA with leukocytes & bacteria. Upreg negative.  She  was admitted to [facility] for further management and evaluation.  On exam, she was TTP on abdominal exam.  She was destractable.  Generalized weakness was observed.  There was questionable secondary gain, as patient mentioned disability during evaluation.  Her skin was extremely sensitive to touch.  She was otherwise neurologically intact.  Patient was evaluated for possible DVT.  D-dimer was negative, LLE doppler negative.  TSH was also obtained and was normal.  LLE pain was very consistent with neuropathic pain and patient was started on Neurontin 300mg  during hospitalization.  She is to titrate this as directed.  She was evaluated by physical therapy who advised for SNF. She decided to return home instead. She reported she would have 24 hour supervision by family. Patient's weakness gradually improved during hospitalization and she was discharged in stable back home with home health physical therapy for continued physical rehabilitation.    As patient reported seizure activity the night prior, neurology was consulted.  Neurology increased patient's Keppra from 500mg  BID to 750mg  BID and advised her to establish outpatient for follow up.    On admission, patient had pressures that were lowered into the 90's/50's.  She was hydrated with MIVF.  Cardiology was also consulted, as patient had been seen in March for pericarditis and was started on Colchicine and Indomethacin.  Cardiology deemed pericarditis no longer present and advised to discontinue Colchicine and Indomethacin.  Of note, Indomethacin was held on admission given report of GI bleed by patient.  Patient exhibited no cardiac abnormalities on EKG.  During hospitalization patient exhibited no bloody BM and no recurrence of emesis.  She was hydrated with IVFs until she was able to tolerate PO.  Her abdominal pain greatly improved and her PO intake was adequate by day of discharge.  Issues for Follow Up:   Patient started on Neurontin for neuropathic  pain  Patient needs to establish with outpatient neurologist  Pericarditis no longer present.  Colchicine and Indomethacin d/c'd  Keppra level obtained but not resulted.  Significant Procedures: none  Significant Labs and Imaging:   Recent Labs Lab 11/18/14 0139  WBC 6.3  HGB 12.7  HCT 37.4  PLT 229    Recent Labs Lab 11/18/14 0139  NA 140  K 4.0  CL 106  CO2 20  GLUCOSE 91  BUN 7  CREATININE 0.74  CALCIUM 9.1  ALKPHOS 77  AST 17  ALT 18  ALBUMIN 3.9   No results found.  Results/Tests Pending at Time of Discharge: [place] level  Discharge Medications:    Medication List    STOP taking these medications        azithromycin 250 MG tablet  Commonly known as:  ZITHROMAX Z-PAK     colchicine 0.6 MG tablet     diphenhydrAMINE 25 mg capsule  Commonly known as:  BENADRYL     HYDROcodone-homatropine 5-1.5 MG/5ML syrup  Commonly known as:  HYCODAN     ibuprofen 200 MG tablet  Commonly known as:  ADVIL,MOTRIN     ibuprofen 600 MG tablet  Commonly known as:  ADVIL,MOTRIN     indomethacin 25 MG capsule  Commonly known as:  INDOCIN     indomethacin 50 MG capsule  Commonly known as:  INDOCIN     levofloxacin 500 MG tablet  Commonly known as:  LEVAQUIN     predniSONE 50 MG tablet  Commonly known as:  DELTASONE      TAKE these medications        albuterol 108 (90 BASE) MCG/ACT inhaler  Commonly known as:  PROVENTIL HFA;VENTOLIN HFA  Inhale 2 puffs into the lungs every 4 (four) hours as needed for wheezing or shortness of breath (or coughing).     DSS 100 MG Caps  Take 100 mg by mouth daily.     fexofenadine 180 MG tablet  Commonly known as:  ALLEGRA  Take 1 tablet (180 mg total) by mouth daily.     gabapentin 300 MG capsule  Commonly known as:  NEURONTIN  Take 1 capsule (300mg ) every night at bedtime x1 week, then take 1 capsule twice daily x1 week, then take 1 capsule 3 times daily.     ipratropium 0.06 % nasal spray  Commonly known as:   ATROVENT  Place 2 sprays into both nostrils 4 (four) times daily.     levETIRAcetam 750 MG tablet  Commonly known as:  KEPPRA  Take 1 tablet (750 mg total) by mouth 2 (two) times daily.     medroxyPROGESTERone 150 MG/ML injection  Commonly known as:  DEPO-PROVERA  Inject 150 mg into the muscle every 3 (three) months. Inject on  the 22nd of each month     omeprazole 20 MG capsule  Commonly known as:  PRILOSEC  Take 1 capsule (20 mg total) by mouth 2 (two) times daily before a meal.     ondansetron 8 MG disintegrating tablet  Commonly known as:  ZOFRAN ODT  Take 1 tablet (8 mg total) by mouth every 8 (eight) hours as needed for nausea or vomiting.     oxyCODONE-acetaminophen 5-325 MG per tablet  Commonly known as:  PERCOCET/ROXICET  Take 1 tablet by mouth every 8 (eight) hours  as needed for severe pain.     PNV PRENATAL PLUS MULTIVITAMIN 27-1 MG Tabs  Take 1 tablet by mouth daily.        Discharge Instructions: Please refer to Patient Instructions section of EMR for full details.  Patient was counseled important signs and symptoms that should prompt return to medical care, changes in medications, dietary instructions, activity restrictions, and follow up appointments.   Follow-Up Appointments: Follow-up Information    Follow up with Tawanna Sat, MD.   Specialty:  Family Medicine   Why:  hospital follow up within 1 week of being discharged from skilled nursing facility   Contact information:   [place] [address] [phone]       Follow up with [facility].   Why:  HHPT, RN, OT, aide and Social Radio producer information:   [place] [address] [phone]       Olam Idler, MD 11/20/2014, 3:18 PM PGY-2, [place]

## 2014-11-20 NOTE — Care Management Note (Signed)
    Page 1 of 2   11/20/2014     3:10:16 PM CARE MANAGEMENT NOTE 11/20/2014  Patient:  ZOIEY, CHRISTY   Account Number:  [id number]  Date Initiated:  11/20/2014  Documentation initiated by:  Tomi Bamberger  Subjective/Objective Assessment:   dx n/v weakness  admit- lives with spouse.     Action/Plan:   pt eval- rec snf- pt refuses   Anticipated DC Date:  11/20/2014   Anticipated DC Plan:  [place]  In-house referral  Clinical Social Worker      DC Forensic scientist  CM consult      [facility] Choice  HOME HEALTH   Choice offered to / List presented to:  C-1 Patient        Cash arranged  HH-1 RN  [employer].   Status of service:  Completed, signed off Medicare Important Message given?  NO (If response is "NO", the following Medicare IM given date fields will be blank) Date Medicare IM given:   Medicare IM given by:   Date Additional Medicare IM given:   Additional Medicare IM given by:    Discharge Disposition:  [place]  Per UR Regulation:  Reviewed for med. necessity/level of care/duration of stay  If discussed at [place] of Stay Meetings, dates discussed:    Comments:  11/20/14 [place], BSN [phone] patient is for dc today, she refuses snf per CSW, NCM offered choice for hh services, she chose [facility], referral made to [facility] for HHRN,PT, OT, aide and Education officer, museum.  Soc will begin 24-48 hrs post dc.  Pateint states her spouse will be transporting her home today.

## 2014-11-20 NOTE — Discharge Instructions (Signed)
Ms Nicole [name] you were hospitalized for generalized weakness. You were evaluated by Neurology for you seizures and your Keppra was increased; You need to establish care with a neurologist for ongoing management. You were evaluated by Cardiology for you previous pericarditis that has resolved and you Colchicine and indomethacin was stopped. You should continue following up with your primary care doctor for your weakness. It was recommended you go to skilled nursing facility for physical therapy; However since you chose to go home home health physical therapy was ordered.

## 2014-11-21 LAB — LEVETIRACETAM LEVEL: LEVETIRACETAM: 10.2 ug/mL

## 2014-11-27 ENCOUNTER — Telehealth: Payer: Self-pay | Admitting: Family Medicine

## 2014-11-27 NOTE — Telephone Encounter (Signed)
Pt called and said that her legs are still hurting. She is taking tylenol and motrin but this doesn't help. jw

## 2014-11-27 NOTE — Telephone Encounter (Signed)
Will forward to MD. Jazmin Hartsell,CMA  

## 2014-11-30 ENCOUNTER — Telehealth: Payer: Self-pay | Admitting: Family Medicine

## 2014-11-30 DIAGNOSIS — R569 Unspecified convulsions: Secondary | ICD-10-CM

## 2014-11-30 NOTE — Telephone Encounter (Signed)
Pt called and would like a referral to [facility] and also would like to speak to a nurse about her pain level. jw

## 2014-12-01 NOTE — Telephone Encounter (Signed)
Pt called and documented under another encounter.

## 2014-12-01 NOTE — Telephone Encounter (Signed)
Pt states that she is in a lot of pain -10- and was told by her home PT to call and ask PCP for something more for pain.  Informed patient that would need to be evaluated for her worsening pain but states that her legs are too weak to come and would have to use the walker. Nicole [name],CMA

## 2014-12-01 NOTE — Telephone Encounter (Signed)
Returned call to patient. She has been having "weakness" and "burning" type pain on inside of both legs, left worse than right, since before she was admitted to the hospital. Reviewed discharge summary and note that patient was given gabapentin for this pain, she had been taking it as instructed one time a day and recently increased to twice a day. Counseled patient that she could increase to 3 times a day now, but be aware of sedating effects (pt reports she doesn't drive), otherwise I can't prescribe any additional medications for her pain without being evaluated. Also discussed that she contacted [facility] and would like a referral there for neurology, told her I would be able to do this since at my last clinic visit I made a referral at that time so that she could get re-established with her history of seizures and taking keppra. Advised if she could not get in to be seen by them next week to make an appointment with [facility] next week with another provider. -Dr. Lamar Benes

## 2014-12-01 NOTE — Telephone Encounter (Signed)
Has been taking Motrin and Tylenol for pain.  Has not had any sleep due to "burning pain in legs (L > R).  Unable to drive due pain and walking with walker.  Has been weak since leaving hospital.  Husband drives trucks and will not be back home until Friday.  Wants to know what can she take for pain until she can be seen.  States she does not have $150 for ED visit and will try to make an appt here on Monday.  Will route note to Dr. Lamar Benes and send message in [facility] for advice.  Burna Forts, BSN, RN-BC

## 2014-12-02 ENCOUNTER — Telehealth: Payer: Self-pay | Admitting: Family Medicine

## 2014-12-02 ENCOUNTER — Telehealth: Payer: Self-pay | Admitting: *Deleted

## 2014-12-02 NOTE — Telephone Encounter (Signed)
Attempted to call patient to inform her of neurology appointment set up. Per patient's request I have set up an appointment at [facility] for Wednesday 01/06/2015 @ 1:00pm, patient to arrive 15 minutes early. [address] [place], [place] [address]. Phone number there is [phone]

## 2014-12-02 NOTE — Telephone Encounter (Signed)
Appointment card mailed to patient. Jazmin Hartsell,CMA

## 2014-12-02 NOTE — Telephone Encounter (Signed)
Pt called and was informed about her appointment on 01/06/15. She also stated that she thought that the nurse wanted to talk to the patients therapist. Blima Rich

## 2014-12-02 NOTE — Telephone Encounter (Signed)
Pt states that she has upped her gabapentin per provider's advice and therapist said she is doing much better today.  Advised patient to seek care at ED or UC if necessary while we are closed for christmas.  She voiced understanding. Daijah Scrivens,CMA

## 2014-12-14 ENCOUNTER — Other Ambulatory Visit: Payer: Self-pay | Admitting: Family Medicine

## 2014-12-17 ENCOUNTER — Telehealth: Payer: Self-pay | Admitting: Family Medicine

## 2014-12-17 NOTE — Telephone Encounter (Signed)
Emergency Line / After Hours Call  Patient called the emergency line due to low blood pressures. She states she took her blood pressure and got "68/57". She says she feels funny. Repeat blood pressure was similar. She denies chest pain or shortness of breath. I recommended that she have someone bring her to the emergency room for evaluation tonight. If she is unable to get a ride to the ER, recommended she call 911 for evaluation. Patient understood these instructions and was appreciative.  Chrisandra Netters, MD Family Medicine PGY-3

## 2014-12-18 ENCOUNTER — Ambulatory Visit: Payer: No Typology Code available for payment source | Admitting: Family Medicine

## 2014-12-25 ENCOUNTER — Other Ambulatory Visit: Payer: Self-pay | Admitting: Obstetrics

## 2015-01-03 ENCOUNTER — Telehealth: Payer: Self-pay | Admitting: Family Medicine

## 2015-01-03 NOTE — Telephone Encounter (Signed)
Pt called after hours line because she "feels weird".  States her Bp is elevated and normally isn't.  When asked what it was she states somewhere around 120 over 96 or so.  Denied weakness, CP, SOB, dysarthria, dysphagia, or any other red flags.  Recommended eval with SDA if continues in AM.   Gaspar Bidding R. Awanda Mink, DO of [facility] 01/03/2015, 9:37 PM

## 2015-01-24 ENCOUNTER — Emergency Department (HOSPITAL_COMMUNITY): Payer: No Typology Code available for payment source

## 2015-01-24 ENCOUNTER — Emergency Department (HOSPITAL_COMMUNITY)
Admission: EM | Admit: 2015-01-24 | Discharge: 2015-01-25 | Disposition: A | Discharge status: Home / Self Care | Payer: No Typology Code available for payment source | Attending: Emergency Medicine | Admitting: Emergency Medicine

## 2015-01-24 ENCOUNTER — Encounter (HOSPITAL_COMMUNITY): Payer: Self-pay | Admitting: Emergency Medicine

## 2015-01-24 DIAGNOSIS — R42 Dizziness and giddiness: Secondary | ICD-10-CM | POA: Diagnosis not present

## 2015-01-24 DIAGNOSIS — Z87891 Personal history of nicotine dependence: Secondary | ICD-10-CM | POA: Diagnosis not present

## 2015-01-24 DIAGNOSIS — G43909 Migraine, unspecified, not intractable, without status migrainosus: Secondary | ICD-10-CM | POA: Insufficient documentation

## 2015-01-24 DIAGNOSIS — Z3202 Encounter for pregnancy test, result negative: Secondary | ICD-10-CM | POA: Insufficient documentation

## 2015-01-24 DIAGNOSIS — R51 Headache: Secondary | ICD-10-CM

## 2015-01-24 DIAGNOSIS — R569 Unspecified convulsions: Secondary | ICD-10-CM

## 2015-01-24 DIAGNOSIS — D573 Sickle-cell trait: Secondary | ICD-10-CM | POA: Insufficient documentation

## 2015-01-24 DIAGNOSIS — J3489 Other specified disorders of nose and nasal sinuses: Secondary | ICD-10-CM | POA: Insufficient documentation

## 2015-01-24 DIAGNOSIS — Z880 Allergy status to penicillin: Secondary | ICD-10-CM | POA: Diagnosis not present

## 2015-01-24 DIAGNOSIS — Z8742 Personal history of other diseases of the female genital tract: Secondary | ICD-10-CM | POA: Diagnosis not present

## 2015-01-24 DIAGNOSIS — D649 Anemia, unspecified: Secondary | ICD-10-CM | POA: Insufficient documentation

## 2015-01-24 DIAGNOSIS — Z8719 Personal history of other diseases of the digestive system: Secondary | ICD-10-CM | POA: Insufficient documentation

## 2015-01-24 DIAGNOSIS — J45909 Unspecified asthma, uncomplicated: Secondary | ICD-10-CM | POA: Diagnosis not present

## 2015-01-24 DIAGNOSIS — R519 Headache, unspecified: Secondary | ICD-10-CM

## 2015-01-24 DIAGNOSIS — G40909 Epilepsy, unspecified, not intractable, without status epilepticus: Secondary | ICD-10-CM | POA: Insufficient documentation

## 2015-01-24 DIAGNOSIS — Z79899 Other long term (current) drug therapy: Secondary | ICD-10-CM | POA: Diagnosis not present

## 2015-01-24 LAB — BASIC METABOLIC PANEL
Anion gap: 8 (ref 5–15)
BUN: 12 mg/dL (ref 6–23)
CALCIUM: 8.8 mg/dL (ref 8.4–10.5)
CO2: 23 mmol/L (ref 19–32)
Chloride: 105 mmol/L (ref 96–112)
Creatinine, Ser: 0.81 mg/dL (ref 0.50–1.10)
GFR calc Af Amer: >90 mL/min (ref >90)
GFR calc non Af Amer: >90 mL/min (ref >90)
GLUCOSE: 90 mg/dL (ref 70–99)
Potassium: 3.5 mmol/L (ref 3.5–5.1)
SODIUM: 136 mmol/L (ref 135–145)

## 2015-01-24 LAB — CBC WITH DIFFERENTIAL/PLATELET
BASOS ABS: 0.1 10*3/uL (ref 0.0–0.1)
BASOS PCT: 1 % (ref 0–1)
Eosinophils Absolute: 0.4 10*3/uL (ref 0.0–0.7)
Eosinophils Relative: 5 % (ref 0–5)
HEMATOCRIT: 35.1 % — AB (ref 36.0–46.0)
Hemoglobin: 11.9 g/dL — ABNORMAL LOW (ref 12.0–15.0)
LYMPHS ABS: 2.9 10*3/uL (ref 0.7–4.0)
Lymphocytes Relative: 35 % (ref 12–46)
MCH: 29.3 pg (ref 26.0–34.0)
MCHC: 33.9 g/dL (ref 30.0–36.0)
MCV: 86.5 fL (ref 78.0–100.0)
MONO ABS: 0.7 10*3/uL (ref 0.1–1.0)
MONOS PCT: 9 % (ref 3–12)
NEUTROS ABS: 4.2 10*3/uL (ref 1.7–7.7)
NEUTROS PCT: 51 % (ref 43–77)
Platelets: 265 10*3/uL (ref 150–400)
RBC: 4.06 MIL/uL (ref 3.87–5.11)
RDW: 13 % (ref 11.5–15.5)
WBC: 8.3 10*3/uL (ref 4.0–10.5)

## 2015-01-24 LAB — PREGNANCY, URINE: PREG TEST UR: NEGATIVE

## 2015-01-24 LAB — URINALYSIS, ROUTINE W REFLEX MICROSCOPIC
Bilirubin Urine: NEGATIVE
Glucose, UA: NEGATIVE mg/dL
HGB URINE DIPSTICK: NEGATIVE
KETONES UR: NEGATIVE mg/dL
Nitrite: NEGATIVE
PH: 6 (ref 5.0–8.0)
PROTEIN: NEGATIVE mg/dL
Specific Gravity, Urine: 1.028 (ref 1.005–1.030)
Urobilinogen, UA: 1 mg/dL (ref 0.0–1.0)

## 2015-01-24 LAB — URINE MICROSCOPIC-ADD ON

## 2015-01-24 MED ORDER — METOCLOPRAMIDE HCL 5 MG/ML IJ SOLN
10.0000 mg | Freq: Once | INTRAMUSCULAR | Status: AC
Start: 2015-01-24 — End: 2015-01-24
  Administered 2015-01-24: 10 mg via INTRAVENOUS
  Filled 2015-01-24: qty 2

## 2015-01-24 MED ORDER — DIPHENHYDRAMINE HCL 50 MG/ML IJ SOLN
25.0000 mg | Freq: Once | INTRAMUSCULAR | Status: AC
  Administered 2015-01-24: 25 mg via INTRAVENOUS
  Filled 2015-01-24: qty 1

## 2015-01-24 MED ORDER — LEVETIRACETAM IN NACL 1000 MG/100ML IV SOLN
1000.0000 mg | Freq: Once | INTRAVENOUS | Status: AC
  Administered 2015-01-24: 1000 mg via INTRAVENOUS
  Filled 2015-01-24: qty 100

## 2015-01-24 MED ORDER — MECLIZINE HCL 25 MG PO TABS
25.0000 mg | ORAL_TABLET | Freq: Once | ORAL | Status: AC
  Administered 2015-01-24: 25 mg via ORAL
  Filled 2015-01-24: qty 1

## 2015-01-24 MED ORDER — DIAZEPAM 5 MG/ML IJ SOLN
5.0000 mg | Freq: Once | INTRAMUSCULAR | Status: AC
  Administered 2015-01-24: 5 mg via INTRAVENOUS
  Filled 2015-01-24: qty 2

## 2015-01-24 MED ORDER — MECLIZINE HCL 25 MG PO TABS: 25.0000 mg | ORAL_TABLET | Freq: Three times a day (TID) | ORAL | Status: DC | PRN

## 2015-01-24 MED ORDER — DIAZEPAM 5 MG PO TABS: 5.0000 mg | ORAL_TABLET | Freq: Three times a day (TID) | ORAL | Status: DC | PRN

## 2015-01-24 MED ORDER — KETOROLAC TROMETHAMINE 30 MG/ML IJ SOLN
30.0000 mg | Freq: Once | INTRAMUSCULAR | Status: AC
  Administered 2015-01-24: 30 mg via INTRAVENOUS
  Filled 2015-01-24: qty 1

## 2015-01-24 MED ORDER — ONDANSETRON 4 MG PO TBDP: 4.0000 mg | ORAL_TABLET | Freq: Three times a day (TID) | ORAL | Status: DC | PRN

## 2015-01-24 NOTE — Discharge Instructions (Signed)
Seizure, Adult A seizure means there is unusual activity in the brain. A seizure can cause changes in attention or behavior. Seizures often cause shaking (convulsions). Seizures often last from 30 seconds to 2 minutes. HOME CARE   If you are given medicines, take them exactly as told by your doctor.  Keep all doctor visits as told.  Do not swim or drive until your doctor says it is okay.  Teach others what to do if you have a seizure. They should:  Lay you on the ground.  Put a cushion under your head.  Loosen any tight clothing around your neck.  Turn you on your side.  Stay with you until you get better. GET HELP RIGHT AWAY IF:   The seizure lasts longer than 2 to 5 minutes.  The seizure is very bad.  The person does not wake up after the seizure.  The person's attention or behavior changes. Drive the person to the emergency room or call your local emergency services (911 in [place] MAKE SURE YOU:   Understand these instructions.  Will watch your condition.  Will get help right away if you are not doing well or get worse. Document Released: 05/15/2008 Document Revised: 02/19/2012 Document Reviewed: 11/15/2011 [facility] Patient Information 2015 [place], [place]. This information is not intended to replace advice given to you by your health care provider. Make sure you discuss any questions you have with your health care provider.  Vertigo Vertigo means you feel like you or your surroundings are moving when they are not. Vertigo can be dangerous if it occurs when you are at work, driving, or performing difficult activities.  CAUSES  Vertigo occurs when there is a conflict of signals sent to your brain from the visual and sensory systems in your body. There are many different causes of vertigo, including:  Infections, especially in the inner ear.  A bad reaction to a drug or misuse of alcohol and medicines.  Withdrawal from drugs or alcohol.  Rapidly changing  positions, such as lying down or rolling over in bed.  A migraine headache.  Decreased blood flow to the brain.  Increased pressure in the brain from a head injury, infection, tumor, or bleeding. SYMPTOMS  You may feel as though the world is spinning around or you are falling to the ground. Because your balance is upset, vertigo can cause nausea and vomiting. You may have involuntary eye movements (nystagmus). DIAGNOSIS  Vertigo is usually diagnosed by physical exam. If the cause of your vertigo is unknown, your caregiver may perform imaging tests, such as an MRI scan (magnetic resonance imaging). TREATMENT  Most cases of vertigo resolve on their own, without treatment. Depending on the cause, your caregiver may prescribe certain medicines. If your vertigo is related to body position issues, your caregiver may recommend movements or procedures to correct the problem. In rare cases, if your vertigo is caused by certain inner ear problems, you may need surgery. HOME CARE INSTRUCTIONS   Follow your caregiver's instructions.  Avoid driving.  Avoid operating heavy machinery.  Avoid performing any tasks that would be dangerous to you or others during a vertigo episode.  Tell your caregiver if you notice that certain medicines seem to be causing your vertigo. Some of the medicines used to treat vertigo episodes can actually make them worse in some people. SEEK IMMEDIATE MEDICAL CARE IF:   Your medicines do not relieve your vertigo or are making it worse.  You develop problems with talking, walking, weakness,  or using your arms, hands, or legs.  You develop severe headaches.  Your nausea or vomiting continues or gets worse.  You develop visual changes.  A family member notices behavioral changes.  Your condition gets worse. MAKE SURE YOU:  Understand these instructions.  Will watch your condition.  Will get help right away if you are not doing well or get worse. Document  Released: 09/06/2005 Document Revised: 02/19/2012 Document Reviewed: 06/15/2011 [facility] Patient Information 2015 [place], [place]. This information is not intended to replace advice given to you by your health care provider. Make sure you discuss any questions you have with your health care provider.

## 2015-01-24 NOTE — ED Provider Notes (Signed)
CSN: [id number]     Arrival date & time 01/24/15  2150 History   First MD Initiated Contact with Patient 01/24/15 2203     Chief Complaint  Patient presents with  . Seizures  . Headache      HPI  Patient presents for evaluation of headache and a seizure at home. Has history of both migraine headaches and seizure. States she's had a headache for the last 3 days. Is off of her Opinion for about 2 weeks. States she was waiting for her doctor's appointment next week to refill it. Continues on the rest her regular medications including her Keppra which she takes for her seizure disorder.  She states that she's had dizziness since this morning. States when she moves her head she feels like things are spinning around. She's had sinus pain and pressure to nasal congestion for the last 4-5 days. She thinks might be triggering her headache. Describes a vertigo. His never had this sensation before.  She went to stand up at her mother's house and when she stood up she said she felt dizzy and Went to the ground. Did not lose consciousness or have seizure at this point her family drove her home.  When getting out of her car home proximal half an hour later mother describes a generalized seizure. Mother states that she has "known her for her whole life" and states this looks typical for the seizures she has had in the past.  Postictal period for about 10 minutes and his normal mental status since. Past Medical History  Diagnosis Date  . Anemia   . Asthma   . Blood transfusion 2011    r/t anemia  . Migraine   . Sickle cell trait   . Seizures     last sz Jan 2012  . Seizures   . Fibroid   . Tooth abscess   . Sickle cell trait   . Migraine    Past Surgical History  Procedure Laterality Date  . Tubal ligation Bilateral 2005   Family History  Problem Relation Age of Onset  . Anesthesia problems Neg Hx   . Hypertension Mother   . Diabetes Mother   . Cancer Mother     Colon, brain, 2 other   . Asthma Mother   . Clotting disorder Mother   . Heart disease Mother     5, stents  . Stroke Mother     2012  . Diabetes Father   . Asthma Father   . Clotting disorder Father   . Sickle cell anemia Father   . Heart disease Father     2000, stents  . Hypertension Father   . Stroke Father     2007  . Diabetes Maternal Grandmother   . Asthma Sister   . Asthma Brother   . Asthma Paternal Grandmother    History  Substance Use Topics  . Smoking status: Former Smoker    Quit date: 03/27/2014  . Smokeless tobacco: Never Used  . Alcohol Use: No   OB History    Gravida Para Term Preterm AB TAB SAB Ectopic Multiple Living   3 2 1 1 1  0 0 1 0 2     Review of Systems  Constitutional: Negative for fever, chills, diaphoresis, appetite change and fatigue.  HENT: Positive for postnasal drip, rhinorrhea and sinus pressure. Negative for mouth sores, sore throat and trouble swallowing.   Eyes: Negative for visual disturbance.  Respiratory: Negative for cough, chest tightness, shortness of  breath and wheezing.   Cardiovascular: Negative for chest pain.  Gastrointestinal: Negative for nausea, vomiting, abdominal pain, diarrhea and abdominal distention.  Endocrine: Negative for polydipsia, polyphagia and polyuria.  Genitourinary: Negative for dysuria, frequency and hematuria.  Musculoskeletal: Negative for gait problem.  Skin: Negative for color change, pallor and rash.  Neurological: Positive for dizziness, seizures and headaches. Negative for syncope and light-headedness.  Hematological: Does not bruise/bleed easily.  Psychiatric/Behavioral: Negative for behavioral problems and confusion.      Allergies  Penicillins and Sulfa antibiotics  Home Medications   Prior to Admission medications   Medication Sig Start Date End Date Taking? Authorizing Provider  albuterol (PROVENTIL HFA;VENTOLIN HFA) 108 (90 BASE) MCG/ACT inhaler Inhale 2 puffs into the lungs every 4 (four) hours as  needed for wheezing or shortness of breath (or coughing). 08/19/82  Yes Delora Fuel, MD  docusate sodium 100 MG CAPS Take 100 mg by mouth daily. 11/20/14  Yes Ashly M Gottschalk, DO  fexofenadine (ALLEGRA) 180 MG tablet Take 1 tablet (180 mg total) by mouth daily. 08/13/14  Yes Leone Brand, MD  ipratropium (ATROVENT) 0.06 % nasal spray Place 2 sprays into both nostrils 4 (four) times daily. Patient taking differently: Place 2 sprays into both nostrils 4 (four) times daily as needed for rhinitis.  10/25/14  Yes Hilton Sinclair, MD  levETIRAcetam (KEPPRA) 750 MG tablet TAKE 1 TABLET (750 MG TOTAL) BY MOUTH 2 (TWO) TIMES DAILY. 12/14/14  Yes Leone Brand, MD  medroxyPROGESTERone (DEPO-PROVERA) 150 MG/ML injection Inject 150 mg into the muscle every 3 (three) months. Inject on  the 22nd of each month 02/11/14  Yes Shelly Bombard, MD  naproxen sodium (ANAPROX) 220 MG tablet Take 440 mg by mouth 2 (two) times daily as needed (pain).   Yes Historical Provider, MD  omeprazole (PRILOSEC) 20 MG capsule Take 1 capsule (20 mg total) by mouth 2 (two) times daily before a meal. Patient taking differently: Take 20 mg by mouth daily.  01/28/14  Yes Shelly Bombard, MD  Prenatal Vit-Fe Fumarate-FA (PNV PRENATAL PLUS MULTIVITAMIN) 27-1 MG TABS Take 1 tablet by mouth daily. 07/22/14  Yes Historical Provider, MD  diazepam (VALIUM) 5 MG tablet Take 1 tablet (5 mg total) by mouth every 8 (eight) hours as needed (vertigo). 01/24/15   Tanna Furry, MD  gabapentin (NEURONTIN) 300 MG capsule Take 1 capsule (300 mg total) by mouth 3 (three) times daily. 01/25/15   Tanna Furry, MD  meclizine (ANTIVERT) 25 MG tablet Take 1 tablet (25 mg total) by mouth 3 (three) times daily as needed. 01/24/15   Tanna Furry, MD  ondansetron (ZOFRAN ODT) 4 MG disintegrating tablet Take 1 tablet (4 mg total) by mouth every 8 (eight) hours as needed for nausea. 01/24/15   Tanna Furry, MD   BP 131/76 mmHg  Pulse 80  Temp(Src) 98 F (36.7 C) (Oral)   Resp 18  SpO2 97% Physical Exam  Constitutional: She is oriented to person, place, and time. She appears well-developed and well-nourished. No distress.  HENT:  Head: Normocephalic.  Nasal congestion. Pharynx benign. TMs normal. Nystagmus to lateral gaze.  Eyes: Conjunctivae are normal. Pupils are equal, round, and reactive to light. No scleral icterus.  Neck: Normal range of motion. Neck supple. No thyromegaly present.  Cardiovascular: Normal rate and regular rhythm.  Exam reveals no gallop and no friction rub.   No murmur heard. Pulmonary/Chest: Effort normal and breath sounds normal. No respiratory distress. She has no wheezes. She  has no rales.  Abdominal: Soft. Bowel sounds are normal. She exhibits no distension. There is no tenderness. There is no rebound.  Musculoskeletal: Normal range of motion.  Neurological: She is alert and oriented to person, place, and time.  Normal neurological exam with exception of the horizontal nystagmus. Normal vision. Normal cranial nerves other than the nystagmus. Normal strength and sensation 4 extremities.  Skin: Skin is warm and dry. No rash noted.  Psychiatric: She has a normal mood and affect. Her behavior is normal.    ED Course  Procedures (including critical care time) Labs Review Labs Reviewed  CBC WITH DIFFERENTIAL/PLATELET - Abnormal; Notable for the following:    Hemoglobin 11.9 (*)    HCT 35.1 (*)    All other components within normal limits  URINALYSIS, ROUTINE W REFLEX MICROSCOPIC - Abnormal; Notable for the following:    Leukocytes, UA SMALL (*)    All other components within normal limits  BASIC METABOLIC PANEL  PREGNANCY, URINE  URINE MICROSCOPIC-ADD ON    Imaging Review No results found.   EKG Interpretation None      MDM   Final diagnoses:  Seizure  Headache  Vertigo    Patient feeling much improved. Resolution of vertigo. Headache is down to a 1/10. Taking by mouth liquids. Think she appropriate for  outpatient treatment. Given to take daily at bedtime as needed for vertigo. Antivert during the day. Keppra here. Continue her Keppra. Refilled her prescription for her gabapentin.    Tanna Furry, MD 01/27/15 [phone]

## 2015-01-24 NOTE — ED Notes (Signed)
Patient transported to CT 

## 2015-01-24 NOTE — ED Notes (Signed)
Pt complaining of headache all over with dizziness.

## 2015-01-24 NOTE — ED Notes (Signed)
Family states pt has been c/o headache today and about an hour ago pt fell and pt had seizure like activity witnessed by family  Pt has hx of seizures  Pt is c/o abd pain and headache

## 2015-01-25 MED ORDER — GABAPENTIN 300 MG PO CAPS: 300.0000 mg | ORAL_CAPSULE | Freq: Three times a day (TID) | ORAL | Status: DC

## 2015-01-25 NOTE — ED Notes (Signed)
Awake. Verbally responsive. A/O x4. Resp even and unlabored. No audible adventitious breath sounds noted. ABC's intact.  

## 2015-01-31 IMAGING — CT CT HEAD W/O CM
1 of 2 series · 16 of 30 positions shown, 20 images · non-contrast
Comparison: 02/16/2013

CLINICAL DATA: Weakness.  Nausea and vomiting.

EXAM:
CT HEAD WITHOUT CONTRAST
TECHNIQUE: Contiguous axial images were obtained from the base of the skull
through the vertex without intravenous contrast.

[Series 3: head 2.0 h70h · axial · 0.41mm/px · z∈[-95,+19]mm · 16 of 65 slices shown, 20 images]
[im 4/65  brain]
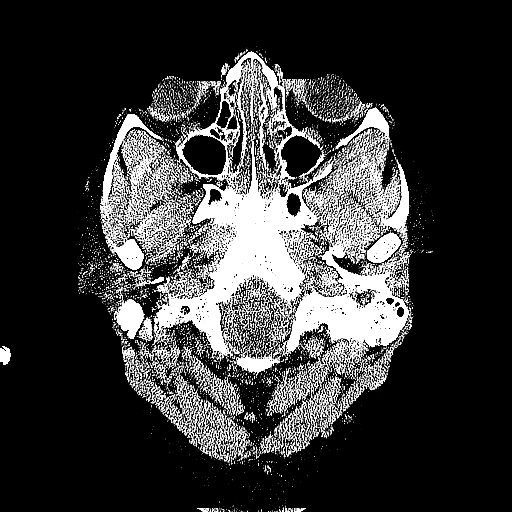
[im 4/65  bone]
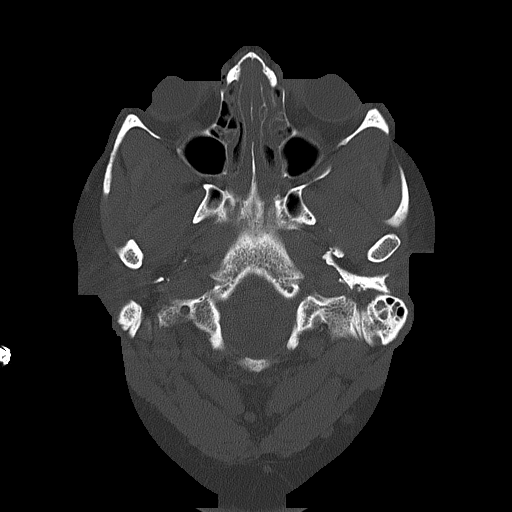
[im 7/65  brain]
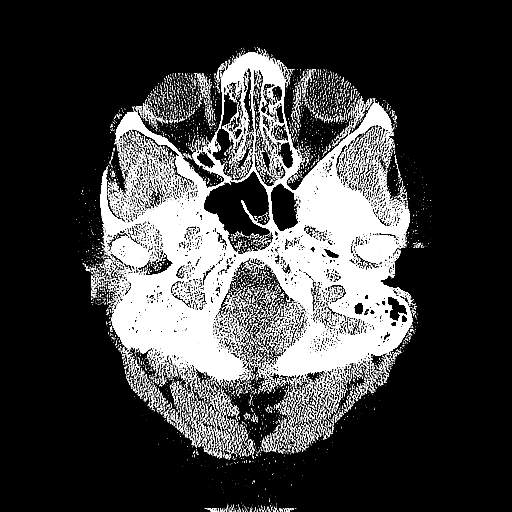
[im 10/65  brain]
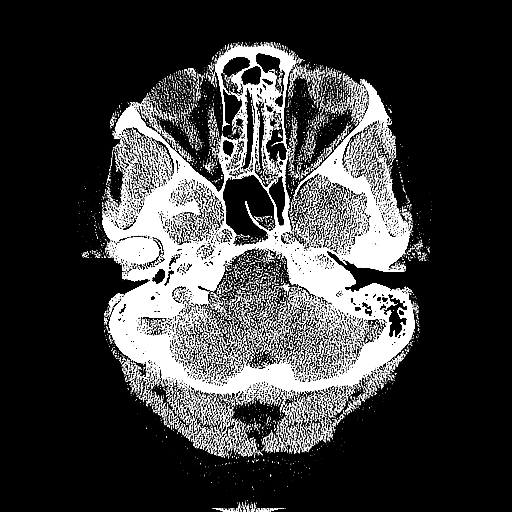
[im 17/65  brain]
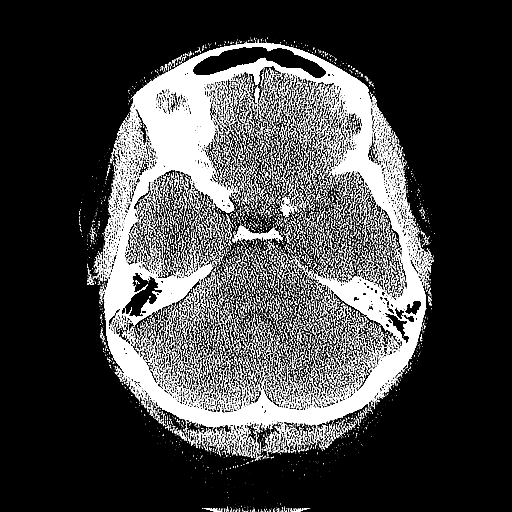
[im 20/65  brain]
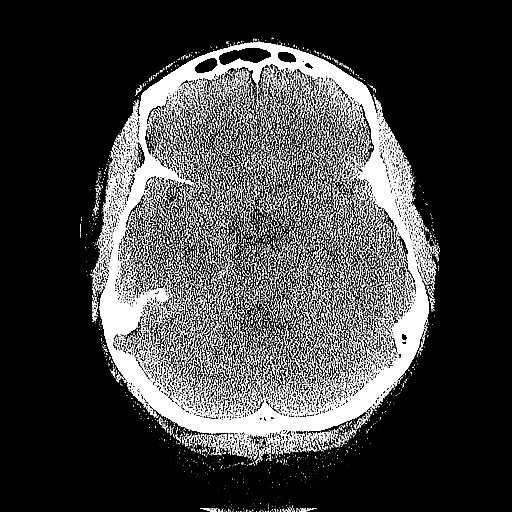
[im 20/65  bone]
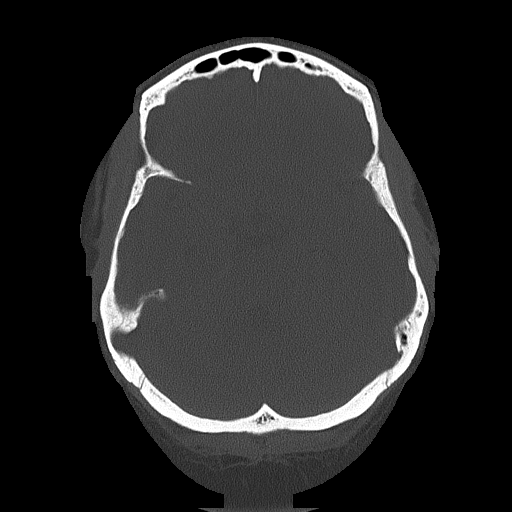
[im 23/65  brain]
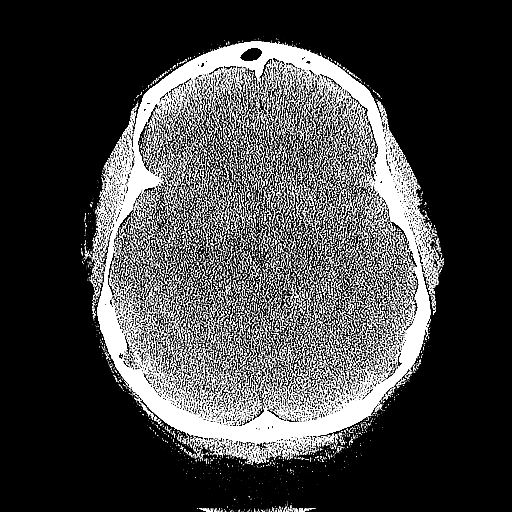
[im 26/65  brain]
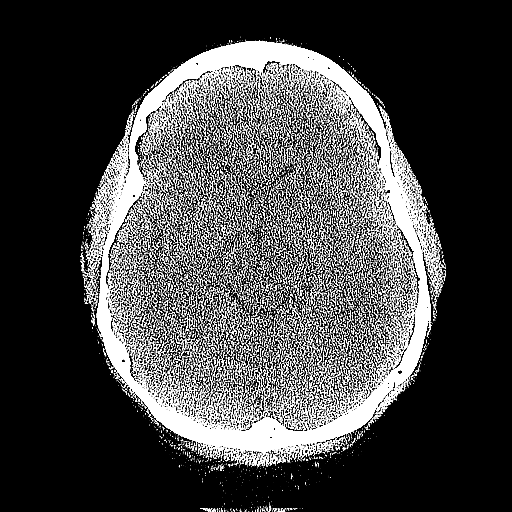
[im 29/65  brain]
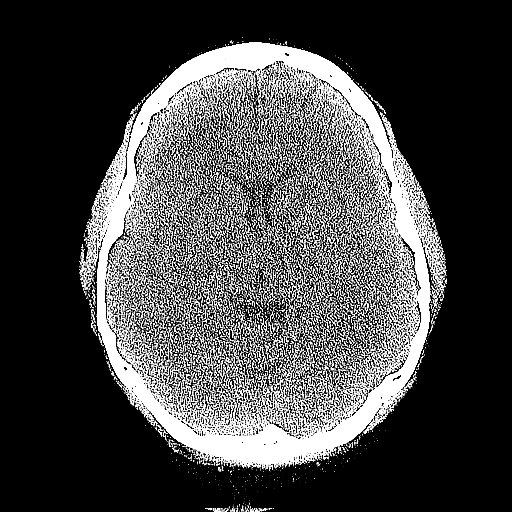
[im 36/65  brain]
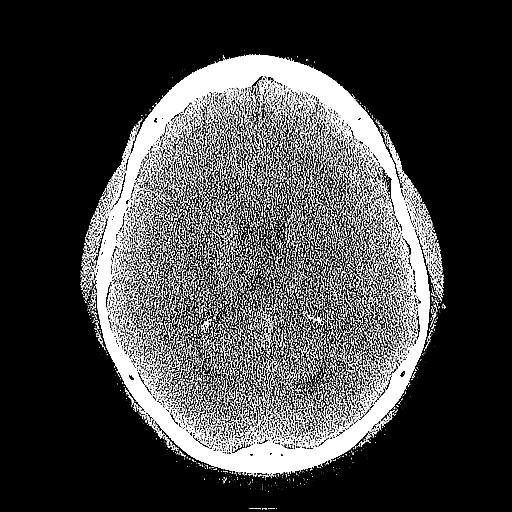
[im 36/65  bone]
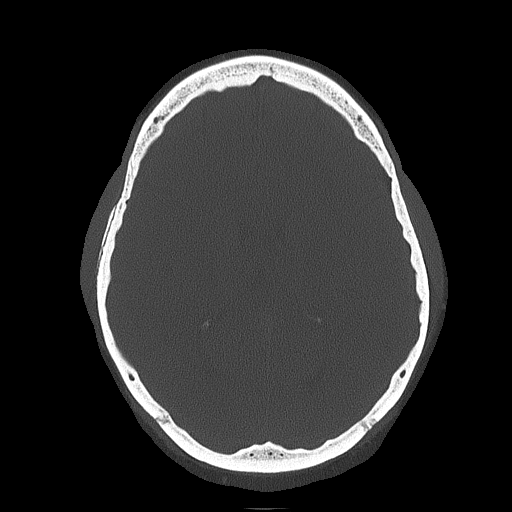
[im 39/65  brain]
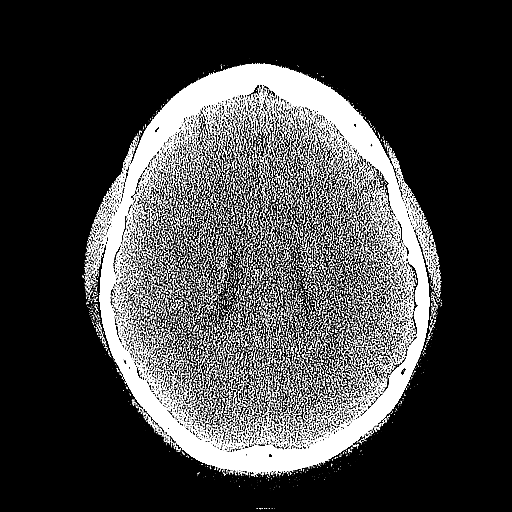
[im 42/65  brain]
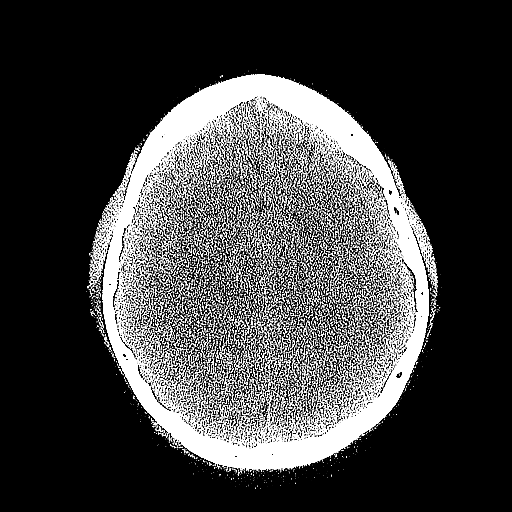
[im 45/65  brain]
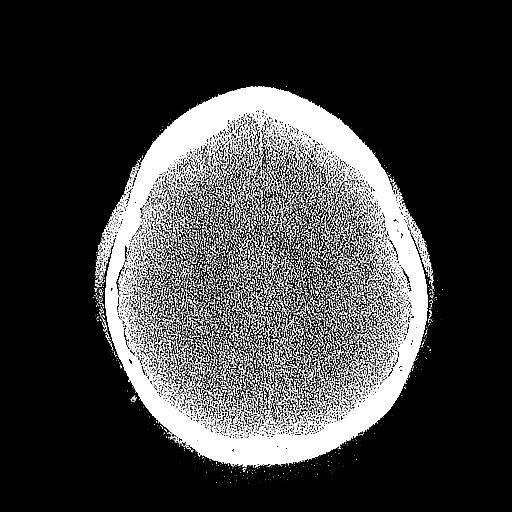
[im 49/65  brain]
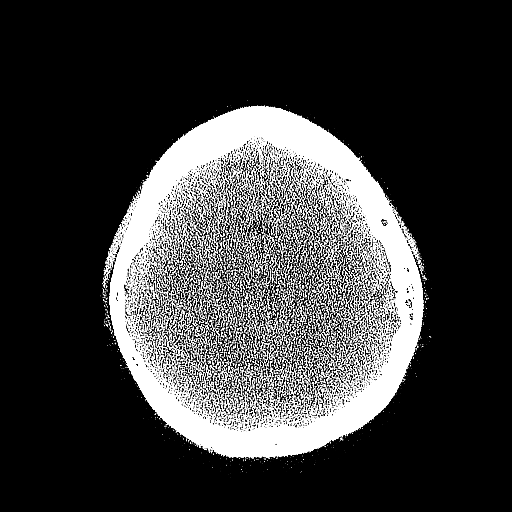
[im 49/65  bone]
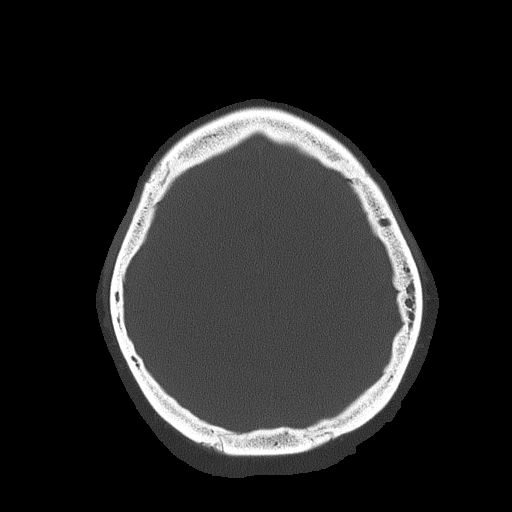
[im 55/65  brain]
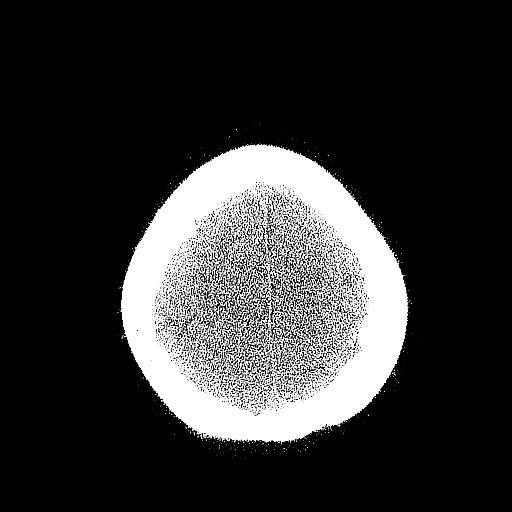
[im 58/65  brain]
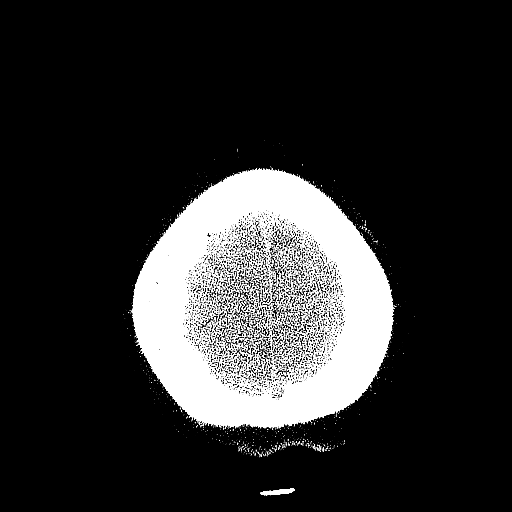
[im 61/65  brain]
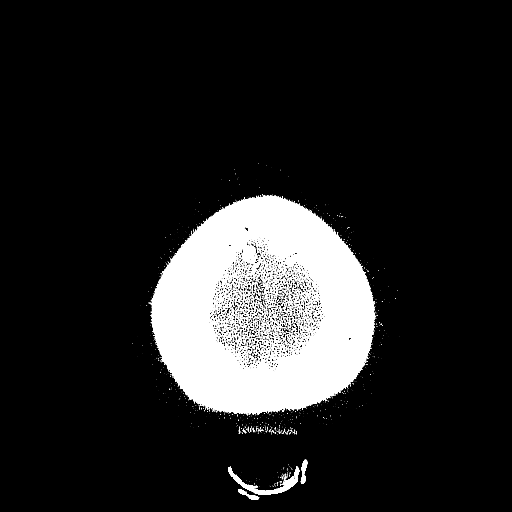

[16 of 30 positions shown; findings below may reference images not displayed]

FINDINGS: The brainstem, cerebellum, cerebral peduncles, thalamus, basal
ganglia, basilar cisterns, and ventricular system appear within
normal limits. No intracranial hemorrhage, mass lesion, or acute
CVA.

Prominent mucosal thickening in the nasal cavity with worsened
chronic ethmoid sinusitis. Chronic frontal sinusitis is present and
there is frothy fluid in the right sphenoid sinus compatible with
acute sphenoid sinusitis.
IMPRESSION: 1. Worsened sinusitis, with acute sphenoid sinusitis, more prominent
ethmoid sinusitis, and prominent nasal mucosal swelling.
2. Intracranial structures appear unremarkable.

## 2015-02-09 ENCOUNTER — Ambulatory Visit: Payer: No Typology Code available for payment source | Admitting: Obstetrics

## 2015-03-05 ENCOUNTER — Emergency Department (HOSPITAL_COMMUNITY): Payer: No Typology Code available for payment source

## 2015-03-05 ENCOUNTER — Emergency Department (HOSPITAL_COMMUNITY)
Admission: EM | Admit: 2015-03-05 | Discharge: 2015-03-05 | Disposition: A | Discharge status: Home / Self Care | Payer: No Typology Code available for payment source | Attending: Emergency Medicine | Admitting: Emergency Medicine

## 2015-03-05 ENCOUNTER — Encounter (HOSPITAL_COMMUNITY): Payer: Self-pay | Admitting: Emergency Medicine

## 2015-03-05 DIAGNOSIS — G40909 Epilepsy, unspecified, not intractable, without status epilepticus: Secondary | ICD-10-CM | POA: Insufficient documentation

## 2015-03-05 DIAGNOSIS — Z87891 Personal history of nicotine dependence: Secondary | ICD-10-CM | POA: Diagnosis not present

## 2015-03-05 DIAGNOSIS — R1030 Lower abdominal pain, unspecified: Secondary | ICD-10-CM

## 2015-03-05 DIAGNOSIS — D649 Anemia, unspecified: Secondary | ICD-10-CM | POA: Diagnosis not present

## 2015-03-05 DIAGNOSIS — N760 Acute vaginitis: Secondary | ICD-10-CM | POA: Diagnosis not present

## 2015-03-05 DIAGNOSIS — B9689 Other specified bacterial agents as the cause of diseases classified elsewhere: Secondary | ICD-10-CM

## 2015-03-05 DIAGNOSIS — G43909 Migraine, unspecified, not intractable, without status migrainosus: Secondary | ICD-10-CM | POA: Diagnosis not present

## 2015-03-05 DIAGNOSIS — Z880 Allergy status to penicillin: Secondary | ICD-10-CM | POA: Diagnosis not present

## 2015-03-05 DIAGNOSIS — J45909 Unspecified asthma, uncomplicated: Secondary | ICD-10-CM | POA: Insufficient documentation

## 2015-03-05 DIAGNOSIS — Z8719 Personal history of other diseases of the digestive system: Secondary | ICD-10-CM | POA: Diagnosis not present

## 2015-03-05 DIAGNOSIS — Z3202 Encounter for pregnancy test, result negative: Secondary | ICD-10-CM | POA: Diagnosis not present

## 2015-03-05 DIAGNOSIS — R112 Nausea with vomiting, unspecified: Secondary | ICD-10-CM | POA: Diagnosis not present

## 2015-03-05 DIAGNOSIS — R509 Fever, unspecified: Secondary | ICD-10-CM | POA: Insufficient documentation

## 2015-03-05 DIAGNOSIS — R109 Unspecified abdominal pain: Secondary | ICD-10-CM

## 2015-03-05 DIAGNOSIS — R1084 Generalized abdominal pain: Secondary | ICD-10-CM | POA: Diagnosis present

## 2015-03-05 DIAGNOSIS — Z79899 Other long term (current) drug therapy: Secondary | ICD-10-CM | POA: Insufficient documentation

## 2015-03-05 LAB — URINALYSIS, ROUTINE W REFLEX MICROSCOPIC
Bilirubin Urine: NEGATIVE
Glucose, UA: NEGATIVE mg/dL
Hgb urine dipstick: NEGATIVE
Ketones, ur: NEGATIVE mg/dL
LEUKOCYTES UA: NEGATIVE
NITRITE: NEGATIVE
PROTEIN: NEGATIVE mg/dL
Specific Gravity, Urine: 1.007 (ref 1.005–1.030)
UROBILINOGEN UA: 0.2 mg/dL (ref 0.0–1.0)
pH: 6.5 (ref 5.0–8.0)

## 2015-03-05 LAB — COMPREHENSIVE METABOLIC PANEL
ALT: 22 U/L (ref 0–35)
ANION GAP: 8 (ref 5–15)
AST: 27 U/L (ref 0–37)
Albumin: 3.9 g/dL (ref 3.5–5.2)
Alkaline Phosphatase: 73 U/L (ref 39–117)
BUN: 9 mg/dL (ref 6–23)
CALCIUM: 8.7 mg/dL (ref 8.4–10.5)
CO2: 19 mmol/L (ref 19–32)
Chloride: 110 mmol/L (ref 96–112)
Creatinine, Ser: 0.76 mg/dL (ref 0.50–1.10)
GFR calc Af Amer: >90 mL/min (ref >90)
GFR calc non Af Amer: >90 mL/min (ref >90)
GLUCOSE: 113 mg/dL — AB (ref 70–99)
Potassium: 3.5 mmol/L (ref 3.5–5.1)
Sodium: 137 mmol/L (ref 135–145)
TOTAL PROTEIN: 7 g/dL (ref 6.0–8.3)
Total Bilirubin: 0.3 mg/dL (ref 0.3–1.2)

## 2015-03-05 LAB — CBC WITH DIFFERENTIAL/PLATELET
Basophils Absolute: 0 10*3/uL (ref 0.0–0.1)
Basophils Relative: 1 % (ref 0–1)
Eosinophils Absolute: 0.3 10*3/uL (ref 0.0–0.7)
Eosinophils Relative: 5 % (ref 0–5)
HEMATOCRIT: 38.5 % (ref 36.0–46.0)
HEMOGLOBIN: 13.1 g/dL (ref 12.0–15.0)
LYMPHS ABS: 2.1 10*3/uL (ref 0.7–4.0)
LYMPHS PCT: 34 % (ref 12–46)
MCH: 29.7 pg (ref 26.0–34.0)
MCHC: 34 g/dL (ref 30.0–36.0)
MCV: 87.3 fL (ref 78.0–100.0)
MONOS PCT: 8 % (ref 3–12)
Monocytes Absolute: 0.5 10*3/uL (ref 0.1–1.0)
NEUTROS ABS: 3.1 10*3/uL (ref 1.7–7.7)
Neutrophils Relative %: 52 % (ref 43–77)
Platelets: 213 10*3/uL (ref 150–400)
RBC: 4.41 MIL/uL (ref 3.87–5.11)
RDW: 13.1 % (ref 11.5–15.5)
WBC: 6 10*3/uL (ref 4.0–10.5)

## 2015-03-05 LAB — PREGNANCY, URINE: PREG TEST UR: NEGATIVE

## 2015-03-05 LAB — LIPASE, BLOOD: Lipase: 34 U/L (ref 11–59)

## 2015-03-05 LAB — WET PREP, GENITAL
Trich, Wet Prep: NONE SEEN
Yeast Wet Prep HPF POC: NONE SEEN

## 2015-03-05 MED ORDER — ONDANSETRON HCL 4 MG/2ML IJ SOLN
4.0000 mg | Freq: Once | INTRAMUSCULAR | Status: AC
  Administered 2015-03-05: 4 mg via INTRAVENOUS
  Filled 2015-03-05: qty 2

## 2015-03-05 MED ORDER — IOHEXOL 300 MG/ML  SOLN
100.0000 mL | Freq: Once | INTRAMUSCULAR | Status: AC | PRN
  Administered 2015-03-05: 100 mL via INTRAVENOUS

## 2015-03-05 MED ORDER — HYDROMORPHONE HCL 1 MG/ML IJ SOLN
1.0000 mg | Freq: Once | INTRAMUSCULAR | Status: AC
  Administered 2015-03-05: 1 mg via INTRAVENOUS
  Filled 2015-03-05: qty 1

## 2015-03-05 MED ORDER — METRONIDAZOLE IN NACL 5-0.79 MG/ML-% IV SOLN
500.0000 mg | Freq: Once | INTRAVENOUS | Status: AC
  Administered 2015-03-05: 500 mg via INTRAVENOUS
  Filled 2015-03-05: qty 100

## 2015-03-05 MED ORDER — DOXYCYCLINE HYCLATE 100 MG PO CAPS: 100.0000 mg | ORAL_CAPSULE | Freq: Two times a day (BID) | ORAL | Status: DC

## 2015-03-05 MED ORDER — METRONIDAZOLE 500 MG PO TABS
500.0000 mg | ORAL_TABLET | Freq: Once | ORAL | Status: DC
Start: 2015-03-05 — End: 2015-03-05

## 2015-03-05 MED ORDER — OXYCODONE-ACETAMINOPHEN 5-325 MG PO TABS
1.0000 | ORAL_TABLET | Freq: Once | ORAL | Status: AC
  Administered 2015-03-05: 1 via ORAL
  Filled 2015-03-05: qty 1

## 2015-03-05 MED ORDER — ONDANSETRON 4 MG PO TBDP: 4.0000 mg | ORAL_TABLET | Freq: Once | ORAL | Status: DC

## 2015-03-05 MED ORDER — SODIUM CHLORIDE 0.9 % IV BOLUS (SEPSIS): 1000.0000 mL | Freq: Once | INTRAVENOUS | Status: DC

## 2015-03-05 MED ORDER — IOHEXOL 300 MG/ML  SOLN
50.0000 mL | Freq: Once | INTRAMUSCULAR | Status: AC | PRN
  Administered 2015-03-05: 50 mL via ORAL

## 2015-03-05 MED ORDER — HYDROCODONE-ACETAMINOPHEN 5-325 MG PO TABS: 1.0000 | ORAL_TABLET | Freq: Once | ORAL | Status: DC

## 2015-03-05 NOTE — ED Notes (Signed)
Contrast completed for Ct scan.

## 2015-03-05 NOTE — ED Notes (Signed)
MD notified pt request something for pain. Ralene Bathe reports will be in to talk to pt shortly.

## 2015-03-05 NOTE — ED Provider Notes (Signed)
CSN: [id number]     Arrival date & time 03/05/15  1029 History   First MD Initiated Contact with Patient 03/05/15 1106     Chief Complaint  Patient presents with  . Abdominal Pain     (Consider location/radiation/quality/duration/timing/severity/associated sxs/prior Treatment) Patient is a 39 y.o. female presenting with abdominal pain. The history is provided by the patient. No language interpreter was used.  Abdominal Pain Pain location:  Generalized Pain quality: cramping   Pain radiates to:  Does not radiate Pain severity:  Moderate Onset quality:  Gradual Duration:  1 week Timing:  Constant Progression:  Worsening Chronicity:  New Relieved by:  Nothing Worsened by:  Movement Ineffective treatments: monistat and pH balance. Associated symptoms: fever, nausea and vomiting   Associated symptoms: no chest pain, no diarrhea and no hematuria   Associated symptoms comment:  Vinegar odor, vaginal discharge   Past Medical History  Diagnosis Date  . Anemia   . Asthma   . Blood transfusion 2011    r/t anemia  . Migraine   . Sickle cell trait   . Seizures     last sz Jan 2012  . Seizures   . Fibroid   . Tooth abscess   . Sickle cell trait   . Migraine    Past Surgical History  Procedure Laterality Date  . Tubal ligation Bilateral 2005   Family History  Problem Relation Age of Onset  . Anesthesia problems Neg Hx   . Hypertension Mother   . Diabetes Mother   . Cancer Mother     Colon, brain, 2 other  . Asthma Mother   . Clotting disorder Mother   . Heart disease Mother     22, stents  . Stroke Mother     2012  . Diabetes Father   . Asthma Father   . Clotting disorder Father   . Sickle cell anemia Father   . Heart disease Father     2000, stents  . Hypertension Father   . Stroke Father     2007  . Diabetes Maternal Grandmother   . Asthma Sister   . Asthma Brother   . Asthma Paternal Grandmother    History  Substance Use Topics  . Smoking status:  Former Smoker    Quit date: 03/27/2014  . Smokeless tobacco: Never Used  . Alcohol Use: No   OB History    Gravida Para Term Preterm AB TAB SAB Ectopic Multiple Living   3 2 1 1 1  0 0 1 0 2     Review of Systems  Constitutional: Positive for fever.  Cardiovascular: Negative for chest pain.  Gastrointestinal: Positive for nausea, vomiting and abdominal pain. Negative for diarrhea.  Genitourinary: Negative for hematuria.      Allergies  Penicillins and Sulfa antibiotics  Home Medications   Prior to Admission medications   Medication Sig Start Date End Date Taking? Authorizing Provider  albuterol (PROVENTIL HFA;VENTOLIN HFA) 108 (90 BASE) MCG/ACT inhaler Inhale 2 puffs into the lungs every 4 (four) hours as needed for wheezing or shortness of breath (or coughing). 54/0/08  Yes Delora Fuel, MD  clonazePAM (KLONOPIN) 1 MG tablet Take 1-2 mg by mouth at bedtime.   Yes Historical Provider, MD  diazepam (VALIUM) 5 MG tablet Take 1 tablet (5 mg total) by mouth every 8 (eight) hours as needed (vertigo). 01/24/15  Yes Tanna Furry, MD  docusate sodium 100 MG CAPS Take 100 mg by mouth daily. 11/20/14  Yes Koleen Distance  Gottschalk, DO  fexofenadine (ALLEGRA) 180 MG tablet Take 1 tablet (180 mg total) by mouth daily. 08/13/14  Yes Leone Brand, MD  gabapentin (NEURONTIN) 300 MG capsule Take 1 capsule (300 mg total) by mouth 3 (three) times daily. 01/25/15  Yes Tanna Furry, MD  ipratropium (ATROVENT) 0.06 % nasal spray Place 2 sprays into both nostrils 4 (four) times daily. Patient taking differently: Place 2 sprays into both nostrils 4 (four) times daily as needed for rhinitis.  10/25/14  Yes Hilton Sinclair, MD  levETIRAcetam (KEPPRA) 1000 MG tablet Take 1,000 mg by mouth 2 (two) times daily.   Yes Historical Provider, MD  meclizine (ANTIVERT) 25 MG tablet Take 1 tablet (25 mg total) by mouth 3 (three) times daily as needed. Patient taking differently: Take 25 mg by mouth 3 (three) times daily as  needed for dizziness.  01/24/15  Yes Tanna Furry, MD  medroxyPROGESTERone (DEPO-PROVERA) 150 MG/ML injection Inject 150 mg into the muscle every 3 (three) months. Inject on  the 22nd of each month 02/11/14  Yes Shelly Bombard, MD  naproxen sodium (ANAPROX) 220 MG tablet Take 440 mg by mouth 2 (two) times daily as needed (pain).   Yes Historical Provider, MD  omeprazole (PRILOSEC) 20 MG capsule Take 1 capsule (20 mg total) by mouth 2 (two) times daily before a meal. Patient taking differently: Take 20 mg by mouth daily.  01/28/14  Yes Shelly Bombard, MD  ondansetron (ZOFRAN ODT) 4 MG disintegrating tablet Take 1 tablet (4 mg total) by mouth every 8 (eight) hours as needed for nausea. 01/24/15  Yes Tanna Furry, MD  Prenatal Vit-Fe Fumarate-FA (PNV PRENATAL PLUS MULTIVITAMIN) 27-1 MG TABS Take 1 tablet by mouth daily. 07/22/14  Yes Historical Provider, MD  topiramate (TOPAMAX) 25 MG tablet Take 25 mg by mouth 2 (two) times daily.   Yes Historical Provider, MD  levETIRAcetam (KEPPRA) 750 MG tablet TAKE 1 TABLET (750 MG TOTAL) BY MOUTH 2 (TWO) TIMES DAILY. Patient not taking: Reported on 03/05/2015 12/14/14   Leone Brand, MD   BP 117/54 mmHg  Pulse 90  Temp(Src) 97.9 F (36.6 C) (Oral)  Resp 18  SpO2 100% Physical Exam  Constitutional: She is oriented to person, place, and time. She appears well-developed and well-nourished.  HENT:  Head: Normocephalic and atraumatic.  Cardiovascular: Normal rate and regular rhythm.   No murmur heard. Pulmonary/Chest: Effort normal and breath sounds normal. No respiratory distress.  Abdominal: Soft. There is no rebound and no guarding.  Mild to moderate diffuse abdominal tenderness  Genitourinary:  White vaginal discharge with CMT  Musculoskeletal: She exhibits no edema or tenderness.  Neurological: She is alert and oriented to person, place, and time.  Skin: Skin is warm and dry.  Psychiatric: She has a normal mood and affect. Her behavior is normal.  Nursing  note and vitals reviewed.   ED Course  Procedures (including critical care time) Labs Review Labs Reviewed  WET PREP, GENITAL - Abnormal; Notable for the following:    Clue Cells Wet Prep HPF POC TOO NUMEROUS TO COUNT (*)    WBC, Wet Prep HPF POC FEW (*)    All other components within normal limits  URINALYSIS, ROUTINE W REFLEX MICROSCOPIC - Abnormal; Notable for the following:    APPearance CLOUDY (*)    All other components within normal limits  COMPREHENSIVE METABOLIC PANEL - Abnormal; Notable for the following:    Glucose, Bld 113 (*)    All other components  within normal limits  PREGNANCY, URINE  CBC WITH DIFFERENTIAL/PLATELET  LIPASE, BLOOD  RPR  HIV ANTIBODY (ROUTINE TESTING)  GC/CHLAMYDIA PROBE AMP ([facility])    Imaging Review US Transvaginal Non-ob  03/05/2015   CLINICAL DATA:  Abdominal pain  EXAM: TRANSABDOMINAL AND TRANSVAGINAL ULTRASOUND OF PELVIS  TECHNIQUE: Both transabdominal and transvaginal ultrasound examinations of the pelvis were performed. Transabdominal technique was performed for global imaging of the pelvis including uterus, ovaries, adnexal regions, and pelvic cul-de-sac. It was necessary to proceed with endovaginal exam following the transabdominal exam to visualize the endometrium and LEFT ovary.  COMPARISON:  02/06/2014  FINDINGS: Uterus  Measurements: 12.5 x 4.8 x 6.7 cm. Heterogeneous myometrial appearance. Anterior nodule likely a leiomyoma 2.7 x 1.9 x 2.4 cm. Additional LEFT lateral nodule likely an additional leiomyoma 2.6 x 2.3 x 2.3 cm.  Endometrium  Thickness: 8 mm thick, poorly delineated.  No endometrial fluid  Right ovary  Measurements: 3.8 x 1.8 x 2.6 cm. Grossly normal morphology without mass. Less well visualized than LEFT ovary due to position.  Left ovary  Measurements: 3.9 x 2.7 x 3.4 cm. Simple appearing cyst 2.9 x 2.0 x 2.3 cm. No additional masses.  Other findings  Trace free pelvic fluid.  No additional adnexal masses identified.   IMPRESSION: Two probable uterine leiomyomas largest 2.7 cm diameter.  Simple appearing LEFT ovarian cyst 2.9 x 2.0 x 2.3 cm.   Electronically Signed   By: Lavonia Dana M.D.   On: 03/05/2015 15:20   US Pelvis Complete  03/05/2015   CLINICAL DATA:  Abdominal pain  EXAM: TRANSABDOMINAL AND TRANSVAGINAL ULTRASOUND OF PELVIS  TECHNIQUE: Both transabdominal and transvaginal ultrasound examinations of the pelvis were performed. Transabdominal technique was performed for global imaging of the pelvis including uterus, ovaries, adnexal regions, and pelvic cul-de-sac. It was necessary to proceed with endovaginal exam following the transabdominal exam to visualize the endometrium and LEFT ovary.  COMPARISON:  02/06/2014  FINDINGS: Uterus  Measurements: 12.5 x 4.8 x 6.7 cm. Heterogeneous myometrial appearance. Anterior nodule likely a leiomyoma 2.7 x 1.9 x 2.4 cm. Additional LEFT lateral nodule likely an additional leiomyoma 2.6 x 2.3 x 2.3 cm.  Endometrium  Thickness: 8 mm thick, poorly delineated.  No endometrial fluid  Right ovary  Measurements: 3.8 x 1.8 x 2.6 cm. Grossly normal morphology without mass. Less well visualized than LEFT ovary due to position.  Left ovary  Measurements: 3.9 x 2.7 x 3.4 cm. Simple appearing cyst 2.9 x 2.0 x 2.3 cm. No additional masses.  Other findings  Trace free pelvic fluid.  No additional adnexal masses identified.  IMPRESSION: Two probable uterine leiomyomas largest 2.7 cm diameter.  Simple appearing LEFT ovarian cyst 2.9 x 2.0 x 2.3 cm.   Electronically Signed   By: Lavonia Dana M.D.   On: 03/05/2015 15:20     EKG Interpretation None      MDM   Final diagnoses:  None    Patient here for evaluation of abdominal pain, abdominal exam initially with mild diffuse abdominal tenderness. Pelvic exam with discharge exam is not consistent with acute PID. Pelvic ultrasound obtained which demonstrates uterine leiomyoma and ovarian cyst. Patient states that the cyst was present  previously. On repeat abdominal exam patient has increased pain in the right lower quadrant. Checking CT abdomen to rule out appendicitis. Patient care transferred pending CT abdomen. Discussed with patient findings a pelvic exam and will treat for BV.    Quintella Reichert, MD 03/05/15 [phone]

## 2015-03-05 NOTE — ED Notes (Signed)
Dr. Gentry at bedside. 

## 2015-03-05 NOTE — ED Notes (Signed)
Flagyl connected to RAC at present time; infusion not complete.

## 2015-03-05 NOTE — ED Provider Notes (Signed)
Assumed care of pt from Dr. Ralene Bathe.  Pt presented initially with abd pain and dc, has BV.  On repeat exam, pt states she has had a fever at home.  On assumption of care, awaiting CT scan.  Had a negative pelvic US.  IV blew, replaced as below.  Emergency Ultrasound Study:   Angiocath insertion Performed by: Debby Freiberg  Consent: Verbal consent obtained. Risks and benefits: risks, benefits and alternatives were discussed Immediately prior to procedure the correct patient, procedure, equipment, support staff and site/side marked as needed.  Indication: difficult IV access Preparation: Patient was prepped and draped in the usual sterile fashion. Vein Location: R brachial vein was visualized during assessment for potential access sites and was found to be patent/ easily compressed with linear ultrasound.  The needle was visualized with real-time ultrasound and guided into the vein. Gauge: 20  Image saved and stored.  Normal blood return.  Patient tolerance: Patient tolerated the procedure well with no immediate complications.  CT scan without appendicitis, no new findings.  DC home in stable condition.  Lower abdominal pain  Abdominal pain - Plan: US Transvaginal Non-OB, US Transvaginal Non-OB  Bacterial vaginosis         Debby Freiberg, MD 03/05/15 [phone]

## 2015-03-05 NOTE — ED Notes (Signed)
Pt in [place] at present time.

## 2015-03-05 NOTE — ED Notes (Signed)
Patient transported to CT 

## 2015-03-05 NOTE — ED Notes (Signed)
Matt verbalized he will attempt IV ultrasound.

## 2015-03-05 NOTE — Discharge Instructions (Signed)
Abdominal Pain, Women °Abdominal (stomach, pelvic, or belly) pain can be caused by many things. It is important to tell your doctor: °· The location of the pain. °· Does it come and go or is it present all the time? °· Are there things that start the pain (eating certain foods, exercise)? °· Are there other symptoms associated with the pain (fever, nausea, vomiting, diarrhea)? °All of this is helpful to know when trying to find the cause of the pain. °CAUSES  °· Stomach: virus or bacteria infection, or ulcer. °· Intestine: appendicitis (inflamed appendix), regional ileitis (Crohn's disease), ulcerative colitis (inflamed colon), irritable bowel syndrome, diverticulitis (inflamed diverticulum of the colon), or cancer of the stomach or intestine. °· Gallbladder disease or stones in the gallbladder. °· Kidney disease, kidney stones, or infection. °· Pancreas infection or cancer. °· Fibromyalgia (pain disorder). °· Diseases of the female organs: °¨ Uterus: fibroid (non-cancerous) tumors or infection. °¨ Fallopian tubes: infection or tubal pregnancy. °¨ Ovary: cysts or tumors. °¨ Pelvic adhesions (scar tissue). °¨ Endometriosis (uterus lining tissue growing in the pelvis and on the pelvic organs). °¨ Pelvic congestion syndrome (female organs filling up with blood just before the menstrual period). °¨ Pain with the menstrual period. °¨ Pain with ovulation (producing an egg). °¨ Pain with an IUD (intrauterine device, birth control) in the uterus. °¨ Cancer of the female organs. °· Functional pain (pain not caused by a disease, may improve without treatment). °· Psychological pain. °· Depression. °DIAGNOSIS  °Your doctor will decide the seriousness of your pain by doing an examination. °· Blood tests. °· X-rays. °· Ultrasound. °· CT scan (computed tomography, special type of X-ray). °· MRI (magnetic resonance imaging). °· Cultures, for infection. °· Barium enema (dye inserted in the large intestine, to better view it with  X-rays). °· Colonoscopy (looking in intestine with a lighted tube). °· Laparoscopy (minor surgery, looking in abdomen with a lighted tube). °· Major abdominal exploratory surgery (looking in abdomen with a large incision). °TREATMENT  °The treatment will depend on the cause of the pain.  °· Many cases can be observed and treated at home. °· Over-the-counter medicines recommended by your caregiver. °· Prescription medicine. °· Antibiotics, for infection. °· Birth control pills, for painful periods or for ovulation pain. °· Hormone treatment, for endometriosis. °· Nerve blocking injections. °· Physical therapy. °· Antidepressants. °· Counseling with a psychologist or psychiatrist. °· Minor or major surgery. °HOME CARE INSTRUCTIONS  °· Do not take laxatives, unless directed by your caregiver. °· Take over-the-counter pain medicine only if ordered by your caregiver. Do not take aspirin because it can cause an upset stomach or bleeding. °· Try a clear liquid diet (broth or water) as ordered by your caregiver. Slowly move to a bland diet, as tolerated, if the pain is related to the stomach or intestine. °· Have a thermometer and take your temperature several times a day, and record it. °· Bed rest and sleep, if it helps the pain. °· Avoid sexual intercourse, if it causes pain. °· Avoid stressful situations. °· Keep your follow-up appointments and tests, as your caregiver orders. °· If the pain does not go away with medicine or surgery, you may try: °¨ Acupuncture. °¨ Relaxation exercises (yoga, meditation). °¨ Group therapy. °¨ Counseling. °SEEK MEDICAL CARE IF:  °· You notice certain foods cause stomach pain. °· Your home care treatment is not helping your pain. °· You need stronger pain medicine. °· You want your IUD removed. °· You feel faint or   lightheaded. °· You develop nausea and vomiting. °· You develop a rash. °· You are having side effects or an allergy to your medicine. °SEEK IMMEDIATE MEDICAL CARE IF:  °· Your  pain does not go away or gets worse. °· You have a fever. °· Your pain is felt only in portions of the abdomen. The right side could possibly be appendicitis. The left lower portion of the abdomen could be colitis or diverticulitis. °· You are passing blood in your stools (bright red or black tarry stools, with or without vomiting). °· You have blood in your urine. °· You develop chills, with or without a fever. °· You pass out. °MAKE SURE YOU:  °· Understand these instructions. °· Will watch your condition. °· Will get help right away if you are not doing well or get worse. °Document Released: 09/24/2007 Document Revised: 04/13/2014 Document Reviewed: 10/14/2009 °[employer]® Patient Information ©2015 [employer]. This information is not intended to replace advice given to you by your health care provider. Make sure you discuss any questions you have with your health care provider. ° °

## 2015-03-05 NOTE — ED Notes (Signed)
IV with ultrasound being attempted.

## 2015-03-05 NOTE — ED Notes (Signed)
Unable to obtain IV access. Ralene Bathe MD notified and reports hold post pelvic exam.

## 2015-03-05 NOTE — ED Notes (Signed)
Pt from home c/o lower abdominal pain x 1 week. HX of fibroids. Pt also c/o of getting week and fall ing onto left side.

## 2015-03-06 LAB — HIV ANTIBODY (ROUTINE TESTING W REFLEX): HIV SCREEN 4TH GENERATION: NONREACTIVE

## 2015-03-06 LAB — RPR: RPR Ser Ql: NONREACTIVE

## 2015-03-08 ENCOUNTER — Inpatient Hospital Stay (HOSPITAL_COMMUNITY)
Admission: AD | Admit: 2015-03-08 | Discharge: 2015-03-08 | Disposition: A | Discharge status: Home / Self Care | Payer: No Typology Code available for payment source | Source: Ambulatory Visit | Attending: Obstetrics | Admitting: Obstetrics

## 2015-03-08 ENCOUNTER — Encounter (HOSPITAL_COMMUNITY): Payer: Self-pay | Admitting: *Deleted

## 2015-03-08 DIAGNOSIS — R102 Pelvic and perineal pain: Secondary | ICD-10-CM | POA: Diagnosis present

## 2015-03-08 DIAGNOSIS — Z880 Allergy status to penicillin: Secondary | ICD-10-CM | POA: Insufficient documentation

## 2015-03-08 DIAGNOSIS — N83202 Unspecified ovarian cyst, left side: Secondary | ICD-10-CM

## 2015-03-08 DIAGNOSIS — Z882 Allergy status to sulfonamides status: Secondary | ICD-10-CM | POA: Insufficient documentation

## 2015-03-08 DIAGNOSIS — D573 Sickle-cell trait: Secondary | ICD-10-CM | POA: Diagnosis not present

## 2015-03-08 DIAGNOSIS — N8320 Unspecified ovarian cysts: Secondary | ICD-10-CM | POA: Insufficient documentation

## 2015-03-08 DIAGNOSIS — Z87891 Personal history of nicotine dependence: Secondary | ICD-10-CM | POA: Diagnosis not present

## 2015-03-08 DIAGNOSIS — D259 Leiomyoma of uterus, unspecified: Secondary | ICD-10-CM | POA: Diagnosis not present

## 2015-03-08 LAB — CBC WITH DIFFERENTIAL/PLATELET
Basophils Absolute: 0.1 10*3/uL (ref 0.0–0.1)
Basophils Relative: 1 % (ref 0–1)
EOS PCT: 3 % (ref 0–5)
Eosinophils Absolute: 0.2 10*3/uL (ref 0.0–0.7)
HCT: 36.1 % (ref 36.0–46.0)
HEMOGLOBIN: 12.5 g/dL (ref 12.0–15.0)
LYMPHS ABS: 2.4 10*3/uL (ref 0.7–4.0)
LYMPHS PCT: 35 % (ref 12–46)
MCH: 30.2 pg (ref 26.0–34.0)
MCHC: 34.6 g/dL (ref 30.0–36.0)
MCV: 87.2 fL (ref 78.0–100.0)
Monocytes Absolute: 0.5 10*3/uL (ref 0.1–1.0)
Monocytes Relative: 7 % (ref 3–12)
Neutro Abs: 3.6 10*3/uL (ref 1.7–7.7)
Neutrophils Relative %: 54 % (ref 43–77)
Platelets: 214 10*3/uL (ref 150–400)
RBC: 4.14 MIL/uL (ref 3.87–5.11)
RDW: 13.1 % (ref 11.5–15.5)
WBC: 6.7 10*3/uL (ref 4.0–10.5)

## 2015-03-08 LAB — GC/CHLAMYDIA PROBE AMP (~~LOC~~) NOT AT ARMC
CHLAMYDIA, DNA PROBE: NEGATIVE
NEISSERIA GONORRHEA: NEGATIVE

## 2015-03-08 MED ORDER — OXYCODONE-ACETAMINOPHEN 5-325 MG PO TABS: 1.0000 | ORAL_TABLET | Freq: Four times a day (QID) | ORAL | Status: DC | PRN

## 2015-03-08 MED ORDER — HYDROMORPHONE HCL 1 MG/ML IJ SOLN
1.0000 mg | Freq: Once | INTRAMUSCULAR | Status: AC
  Administered 2015-03-08: 1 mg via INTRAMUSCULAR
  Filled 2015-03-08: qty 1

## 2015-03-08 NOTE — MAU Note (Signed)
Urine in lab 

## 2015-03-08 NOTE — MAU Note (Signed)
PT  SAYS SHE HAS ABD PAIN - X2 WEEKS.    WENT  TO  [facility] ON Friday-     TOLD  HAD  OVARIAN CYST ON LEFT-  AND   FIBROIDS  X5 .   TOLD  TO FOLLOW-UP    WITH  DR   HARPER.    TOOK TYLENOL 2 XS  AT 0300- NO RELIEF  AND  MOTRIN  800 MG  AT  0600- NO RELIEF.     BTL-  2005   LAST SEX-   2015

## 2015-03-08 NOTE — MAU Note (Addendum)
Dr Jacelyn Grip nurse told her to come here, dr's office is closed.  Was at [place] on Fri, dx with a cyst on rt side.  Still having pain. Is nauseated.  Was put on Doxycycline.

## 2015-03-08 NOTE — MAU Provider Note (Signed)
History     CSN: [id number]  Arrival date and time: 03/08/15 1129   First Provider Initiated Contact with Patient 03/08/15 1211      Chief Complaint  Patient presents with  . Abdominal Pain   HPI  [name] is a 39 y.o. [id number] who presents to MAU today with complaint of LLQ pain. The patient was seen at [facility] on 03/05/15 and had complete work-up including infection testing, pelvic US and CT scan of the abdomen and pelvis. Patient has two 3 cm fibroids and a 2 cm simple left ovarian cyst. She states continued pain slightly worse than last week. She took Tylenol at 0300 and Motrin at 0600 without relief. She denies vaginal bleeding, diarrhea, constipation, UTI symptoms or fever. She was diagnosed with BV and put on Doxycycline. She states N/V occasionally related to food and smells, this is not a new problem.  OB History    Gravida Para Term Preterm AB TAB SAB Ectopic Multiple Living   3 2 1 1 1  0 0 1 0 2      Past Medical History  Diagnosis Date  . Anemia   . Asthma   . Blood transfusion 2011    r/t anemia  . Migraine   . Sickle cell trait   . Seizures     last sz Jan 2012  . Seizures   . Fibroid   . Tooth abscess   . Sickle cell trait   . Migraine     Past Surgical History  Procedure Laterality Date  . Tubal ligation Bilateral 2005    Family History  Problem Relation Age of Onset  . Anesthesia problems Neg Hx   . Hypertension Mother   . Diabetes Mother   . Cancer Mother     Colon, brain, 2 other  . Asthma Mother   . Clotting disorder Mother   . Heart disease Mother     73, stents  . Stroke Mother     2012  . Diabetes Father   . Asthma Father   . Clotting disorder Father   . Sickle cell anemia Father   . Heart disease Father     2000, stents  . Hypertension Father   . Stroke Father     2007  . Diabetes Maternal Grandmother   . Asthma Sister   . Asthma Brother   . Asthma Paternal Grandmother     History  Substance Use Topics  . Smoking  status: Former Smoker    Quit date: 03/27/2014  . Smokeless tobacco: Never Used  . Alcohol Use: No    Allergies:  Allergies  Allergen Reactions  . Penicillins Swelling  . Sulfa Antibiotics Rash    No prescriptions prior to admission    Review of Systems  Constitutional: Negative for fever and malaise/fatigue.  Gastrointestinal: Positive for nausea, vomiting and abdominal pain. Negative for diarrhea and constipation.  Genitourinary: Negative for dysuria, urgency and frequency.       Neg - vaginal bleeding + vaginal discharge   Physical Exam   Blood pressure 109/75, pulse 71, temperature 98.8 F (37.1 C), temperature source Oral, resp. rate 18, weight 255 lb (115.667 kg), last menstrual period 02/22/2015.  Physical Exam  Constitutional: She is oriented to person, place, and time. She appears well-developed and well-nourished. No distress.  HENT:  Head: Normocephalic.  Cardiovascular: Normal rate.   Respiratory: Effort normal.  GI: Soft. She exhibits no distension and no mass. There is tenderness (mild tenderness to  palpation of the LLQ). There is no rebound and no guarding.  Neurological: She is alert and oriented to person, place, and time.  Skin: Skin is warm and dry. No erythema.  Psychiatric: She has a normal mood and affect.   Results for orders placed or performed during the hospital encounter of 03/08/15 (from the past 24 hour(s))  CBC with Differential/Platelet     Status: None   Collection Time: 03/08/15 12:45 PM  Result Value Ref Range   WBC 6.7 4.0 - 10.5 K/uL   RBC 4.14 3.87 - 5.11 MIL/uL   Hemoglobin 12.5 12.0 - 15.0 g/dL   HCT 36.1 36.0 - 46.0 %   MCV 87.2 78.0 - 100.0 fL   MCH 30.2 26.0 - 34.0 pg   MCHC 34.6 30.0 - 36.0 g/dL   RDW 13.1 11.5 - 15.5 %   Platelets 214 150 - 400 K/uL   Neutrophils Relative % 54 43 - 77 %   Neutro Abs 3.6 1.7 - 7.7 K/uL   Lymphocytes Relative 35 12 - 46 %   Lymphs Abs 2.4 0.7 - 4.0 K/uL   Monocytes Relative 7 3 - 12 %    Monocytes Absolute 0.5 0.1 - 1.0 K/uL   Eosinophils Relative 3 0 - 5 %   Eosinophils Absolute 0.2 0.0 - 0.7 K/uL   Basophils Relative 1 0 - 1 %   Basophils Absolute 0.1 0.0 - 0.1 K/uL     MAU Course  Procedures None  MDM CBC today 1 mg IM Dilaudid given - patient reports significant improvement in pain  Assessment and Plan  A: Uterine fibroids Ovarian cyst Pelvic pain  P: Discharge home Rx for Percocet given to patient Patient advised to follow-up with Dr. Jodi Mourning ASAP for further management Patient may return to MAU as needed or if her condition were to change or worsen   Luvenia Redden, PA-C  03/08/2015, 2:11 PM

## 2015-03-08 NOTE — Discharge Instructions (Signed)
Fibroids Fibroids are lumps (tumors) that can occur any place in a woman's body. These lumps are not cancerous. Fibroids vary in size, weight, and where they grow. HOME CARE  Do not take aspirin.  Write down the number of pads or tampons you use during your period. Tell your doctor. This can help determine the best treatment for you. GET HELP RIGHT AWAY IF:  You have pain in your lower belly (abdomen) that is not helped with medicine.  You have cramps that are not helped with medicine.  You have more bleeding between or during your period.  You feel lightheaded or pass out (faint).  Your lower belly pain gets worse. MAKE SURE YOU:  Understand these instructions.  Will watch your condition.  Will get help right away if you are not doing well or get worse. Document Released: 12/30/2010 Document Revised: 02/19/2012 Document Reviewed: 12/30/2010 [facility] Patient Information 2015 [place], [place]. This information is not intended to replace advice given to you by your health care provider. Make sure you discuss any questions you have with your health care provider. Ovarian Cyst An ovarian cyst is a sac filled with fluid or blood. This sac is attached to the ovary. Some cysts go away on their own. Other cysts need treatment.  HOME CARE   Only take medicine as told by your doctor.  Follow up with your doctor as told.  Get regular pelvic exams and Pap tests. GET HELP IF:  Your periods are late, not regular, or painful.  You stop having periods.  Your belly (abdominal) or pelvic pain does not go away.  Your belly becomes large or puffy (swollen).  You have a hard time peeing (totally emptying your bladder).  You have pressure on your bladder.  You have pain during sex.  You feel fullness, pressure, or discomfort in your belly.  You lose weight for no reason.  You feel sick most of the time.  You have a hard time pooping (constipation).  You do not feel like  eating.  You develop pimples (acne).  You have an increase in hair on your body and face.  You are gaining weight for no reason.  You think you are pregnant. GET HELP RIGHT AWAY IF:   Your belly pain gets worse.  You feel sick to your stomach (nauseous), and you throw up (vomit).  You have a fever that comes on fast.  You have belly pain while pooping (bowel movement).  Your periods are heavier than usual. MAKE SURE YOU:   Understand these instructions.  Will watch your condition.  Will get help right away if you are not doing well or get worse. Document Released: 05/15/2008 Document Revised: 09/17/2013 Document Reviewed: 08/04/2013 [facility] Patient Information 2015 [place], [place]. This information is not intended to replace advice given to you by your health care provider. Make sure you discuss any questions you have with your health care provider.

## 2015-03-11 ENCOUNTER — Encounter: Payer: Self-pay | Admitting: Obstetrics

## 2015-03-11 ENCOUNTER — Ambulatory Visit (INDEPENDENT_AMBULATORY_CARE_PROVIDER_SITE_OTHER): Payer: No Typology Code available for payment source | Admitting: Obstetrics

## 2015-03-11 VITALS — BP 112/82 | HR 81 | Temp 97.9°F | Wt 257.0 lb

## 2015-03-11 DIAGNOSIS — Z3042 Encounter for surveillance of injectable contraceptive: Secondary | ICD-10-CM | POA: Diagnosis not present

## 2015-03-11 DIAGNOSIS — A499 Bacterial infection, unspecified: Secondary | ICD-10-CM

## 2015-03-11 DIAGNOSIS — N83202 Unspecified ovarian cyst, left side: Secondary | ICD-10-CM

## 2015-03-11 DIAGNOSIS — B9689 Other specified bacterial agents as the cause of diseases classified elsewhere: Secondary | ICD-10-CM

## 2015-03-11 DIAGNOSIS — J4520 Mild intermittent asthma, uncomplicated: Secondary | ICD-10-CM | POA: Diagnosis not present

## 2015-03-11 DIAGNOSIS — N8320 Unspecified ovarian cysts: Secondary | ICD-10-CM | POA: Diagnosis not present

## 2015-03-11 DIAGNOSIS — Z889 Allergy status to unspecified drugs, medicaments and biological substances status: Secondary | ICD-10-CM | POA: Diagnosis not present

## 2015-03-11 DIAGNOSIS — N760 Acute vaginitis: Secondary | ICD-10-CM

## 2015-03-11 DIAGNOSIS — N946 Dysmenorrhea, unspecified: Secondary | ICD-10-CM

## 2015-03-11 MED ORDER — MEDROXYPROGESTERONE ACETATE 150 MG/ML IM SUSP: 150.0000 mg | INTRAMUSCULAR | Status: DC

## 2015-03-11 MED ORDER — NAPROXEN SODIUM 550 MG PO TABS: 550.0000 mg | ORAL_TABLET | Freq: Two times a day (BID) | ORAL | Status: DC

## 2015-03-11 MED ORDER — MEDROXYPROGESTERONE ACETATE 150 MG/ML IM SUSP
150.0000 mg | Freq: Once | INTRAMUSCULAR | Status: AC
  Administered 2015-03-11: 150 mg via INTRAMUSCULAR

## 2015-03-11 MED ORDER — LORATADINE 10 MG PO TABS: 10.0000 mg | ORAL_TABLET | Freq: Every day | ORAL | Status: DC

## 2015-03-11 MED ORDER — ALBUTEROL SULFATE HFA 108 (90 BASE) MCG/ACT IN AERS
2.0000 | INHALATION_SPRAY | RESPIRATORY_TRACT | Status: DC | PRN
Start: 2015-03-11 — End: 2017-06-09

## 2015-03-11 MED ORDER — TINIDAZOLE 500 MG PO TABS: 1000.0000 mg | ORAL_TABLET | Freq: Every day | ORAL | Status: DC

## 2015-03-11 MED ORDER — OXYCODONE-ACETAMINOPHEN 10-325 MG PO TABS: 1.0000 | ORAL_TABLET | Freq: Four times a day (QID) | ORAL | Status: DC | PRN

## 2015-03-11 NOTE — Progress Notes (Signed)
Pt was given depo injection at today's visit.  Pt tolerated well.  Pt advised to RTO on June 22,2016 for next injection. Depo policy was reviewed with pt today.   Pt states understanding.    Administrations This Visit    medroxyPROGESTERone (DEPO-PROVERA) injection 150 mg    Admin Date Action Dose Route Administered By         03/11/2015 Given 150 mg Intramuscular Valene Bors, CMA

## 2015-03-11 NOTE — Progress Notes (Signed)
Patient ID: Nicole [name], female   DOB: [birth year], 39 y.o.   MRN: [id number]  Chief Complaint  Patient presents with  . Follow-up    recent hospital visit    HPI Nicole [name] is a 39 y.o. female.  H/O ovarian cyst, small, simple.  Also has uterine fibroids, small, intramural.  Presents for F/U.  Wants Depo Provera for contraception. HPI  Past Medical History  Diagnosis Date  . Anemia   . Asthma   . Blood transfusion 2011    r/t anemia  . Migraine   . Sickle cell trait   . Seizures     last sz Jan 2012  . Seizures   . Fibroid   . Tooth abscess   . Sickle cell trait   . Migraine     Past Surgical History  Procedure Laterality Date  . Tubal ligation Bilateral 2005    Family History  Problem Relation Age of Onset  . Anesthesia problems Neg Hx   . Hypertension Mother   . Diabetes Mother   . Cancer Mother     Colon, brain, 2 other  . Asthma Mother   . Clotting disorder Mother   . Heart disease Mother     25, stents  . Stroke Mother     2012  . Diabetes Father   . Asthma Father   . Clotting disorder Father   . Sickle cell anemia Father   . Heart disease Father     2000, stents  . Hypertension Father   . Stroke Father     2007  . Diabetes Maternal Grandmother   . Asthma Sister   . Asthma Brother   . Asthma Paternal Grandmother     Social History History  Substance Use Topics  . Smoking status: Former Smoker    Quit date: 03/27/2014  . Smokeless tobacco: Never Used  . Alcohol Use: No    Allergies  Allergen Reactions  . Penicillins Swelling  . Sulfa Antibiotics Rash    Current Outpatient Prescriptions  Medication Sig Dispense Refill  . albuterol (PROVENTIL HFA;VENTOLIN HFA) 108 (90 BASE) MCG/ACT inhaler Inhale 2 puffs into the lungs every 4 (four) hours as needed for wheezing or shortness of breath (or coughing). 1 Inhaler prn  . docusate sodium 100 MG CAPS Take 100 mg by mouth daily. 10 capsule 0  . doxycycline (VIBRAMYCIN) 100 MG capsule  Take 1 capsule (100 mg total) by mouth 2 (two) times daily. 28 capsule 0  . fexofenadine (ALLEGRA) 180 MG tablet Take 1 tablet (180 mg total) by mouth daily. 30 tablet 3  . gabapentin (NEURONTIN) 300 MG capsule Take 1 capsule (300 mg total) by mouth 3 (three) times daily. (Patient taking differently: Take 500 mg by mouth 3 (three) times daily. ) 90 capsule 0  . ipratropium (ATROVENT) 0.06 % nasal spray Place 2 sprays into both nostrils 4 (four) times daily. 15 mL 0  . levETIRAcetam (KEPPRA) 1000 MG tablet Take 1,000 mg by mouth 2 (two) times daily.    . meclizine (ANTIVERT) 25 MG tablet Take 1 tablet (25 mg total) by mouth 3 (three) times daily as needed. (Patient taking differently: Take 25 mg by mouth 3 (three) times daily as needed for dizziness. ) 20 tablet 0  . omeprazole (PRILOSEC) 20 MG capsule Take 1 capsule (20 mg total) by mouth 2 (two) times daily before a meal. (Patient taking differently: Take 20 mg by mouth daily. ) 60 capsule 5  . ondansetron (ZOFRAN  ODT) 4 MG disintegrating tablet Take 1 tablet (4 mg total) by mouth every 8 (eight) hours as needed for nausea. 6 tablet 0  . oxyCODONE-acetaminophen (PERCOCET/ROXICET) 5-325 MG per tablet Take 1-2 tablets by mouth every 6 (six) hours as needed for severe pain. 15 tablet 0  . Prenatal Vit-Fe Fumarate-FA (PNV PRENATAL PLUS MULTIVITAMIN) 27-1 MG TABS Take 1 tablet by mouth daily.    Marland Kitchen topiramate (TOPAMAX) 25 MG tablet Take 25 mg by mouth 2 (two) times daily.    . diazepam (VALIUM) 5 MG tablet Take 1 tablet (5 mg total) by mouth every 8 (eight) hours as needed (vertigo). (Patient not taking: Reported on 03/11/2015) 20 tablet 0  . levETIRAcetam (KEPPRA) 750 MG tablet TAKE 1 TABLET (750 MG TOTAL) BY MOUTH 2 (TWO) TIMES DAILY. (Patient not taking: Reported on 03/08/2015) 60 tablet 2  . loratadine (CLARITIN) 10 MG tablet Take 1 tablet (10 mg total) by mouth daily. 30 tablet 11  . medroxyPROGESTERone (DEPO-PROVERA) 150 MG/ML injection Inject 1 mL (150  mg total) into the muscle every 3 (three) months. Inject on  the 22nd of each month 1 mL 3  . naproxen sodium (ANAPROX DS) 550 MG tablet Take 1 tablet (550 mg total) by mouth 2 (two) times daily with a meal. 60 tablet 11  . oxyCODONE-acetaminophen (PERCOCET) 10-325 MG per tablet Take 1 tablet by mouth every 6 (six) hours as needed for pain. 40 tablet 0  . tinidazole (TINDAMAX) 500 MG tablet Take 2 tablets (1,000 mg total) by mouth daily with breakfast. 10 tablet 2   No current facility-administered medications for this visit.    Review of Systems Review of Systems Constitutional: negative for fatigue and weight loss Respiratory: negative for cough and wheezing Cardiovascular: negative for chest pain, fatigue and palpitations Gastrointestinal: negative for abdominal pain and change in bowel habits Genitourinary: Dysmenorrhea Integument/breast: negative for nipple discharge Musculoskeletal:negative for myalgias Neurological: negative for gait problems and tremors Behavioral/Psych: negative for abusive relationship, depression Endocrine: negative for temperature intolerance     Blood pressure 112/82, pulse 81, temperature 97.9 F (36.6 C), weight 257 lb (116.574 kg), last menstrual period 02/22/2015.  Physical Exam Physical Exam General:   alert  Skin:   no rash or abnormalities  Lungs:   clear to auscultation bilaterally  Heart:   regular rate and rhythm, S1, S2 normal, no murmur, click, rub or gallop  Breasts:   normal without suspicious masses, skin or nipple changes or axillary nodes  Abdomen:  normal findings: no organomegaly, soft, non-tender and no hernia  Pelvis:  External genitalia: normal general appearance Urinary system: urethral meatus normal and bladder without fullness, nontender Vaginal: normal without tenderness, induration or masses Cervix: normal appearance Adnexa: normal bimanual exam Uterus: enlarged, ~ 10-12 weeks.      Data  Reviewed Ultrasound  Assessment     Ovarian Cyst, small, physiologic. Uterine Fibroids, small Dysmenorrhea BV Contraceptive management     Plan    Will follow ovarian cyst conservatively. Ibuprofen / Oxycodone Rx for fibroids and dysmenorrhea Tindamax for BV Depo Provera Rx F/U in 6 weeks  No orders of the defined types were placed in this encounter.   Meds ordered this encounter  Medications  . oxyCODONE-acetaminophen (PERCOCET) 10-325 MG per tablet    Sig: Take 1 tablet by mouth every 6 (six) hours as needed for pain.    Dispense:  40 tablet    Refill:  0  . medroxyPROGESTERone (DEPO-PROVERA) 150 MG/ML injection  Sig: Inject 1 mL (150 mg total) into the muscle every 3 (three) months. Inject on  the 22nd of each month    Dispense:  1 mL    Refill:  3  . albuterol (PROVENTIL HFA;VENTOLIN HFA) 108 (90 BASE) MCG/ACT inhaler    Sig: Inhale 2 puffs into the lungs every 4 (four) hours as needed for wheezing or shortness of breath (or coughing).    Dispense:  1 Inhaler    Refill:  prn  . naproxen sodium (ANAPROX DS) 550 MG tablet    Sig: Take 1 tablet (550 mg total) by mouth 2 (two) times daily with a meal.    Dispense:  60 tablet    Refill:  11  . tinidazole (TINDAMAX) 500 MG tablet    Sig: Take 2 tablets (1,000 mg total) by mouth daily with breakfast.    Dispense:  10 tablet    Refill:  2  . loratadine (CLARITIN) 10 MG tablet    Sig: Take 1 tablet (10 mg total) by mouth daily.    Dispense:  30 tablet    Refill:  11  . medroxyPROGESTERone (DEPO-PROVERA) injection 150 mg    Sig:

## 2015-03-31 IMAGING — CR DG CHEST 2V
2 series · 2 of 2 positions shown · non-contrast
Comparison: 01/05/2014

CLINICAL DATA: Chest pain

EXAM:
CHEST  2 VIEW

[w chest pa]
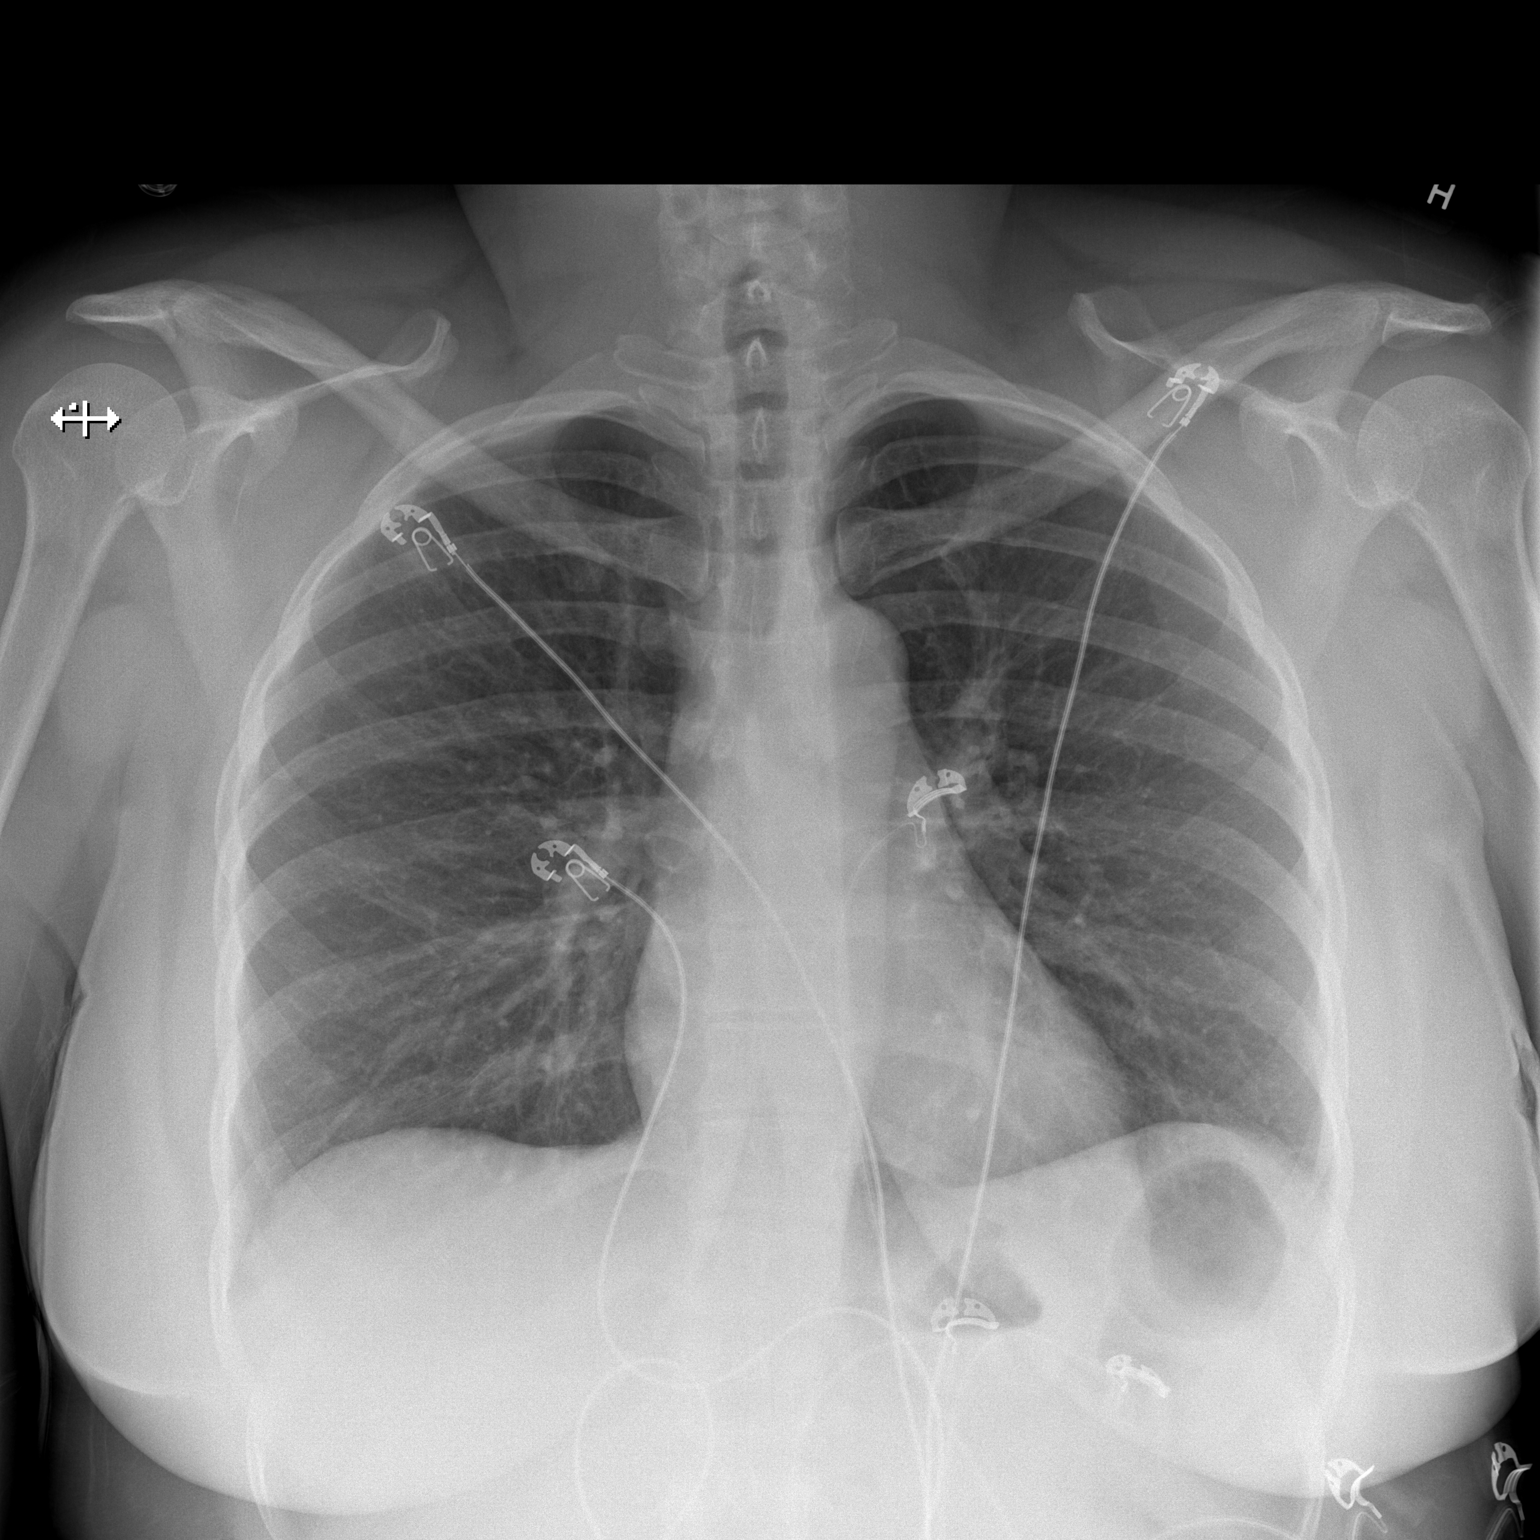

[w chest lat]
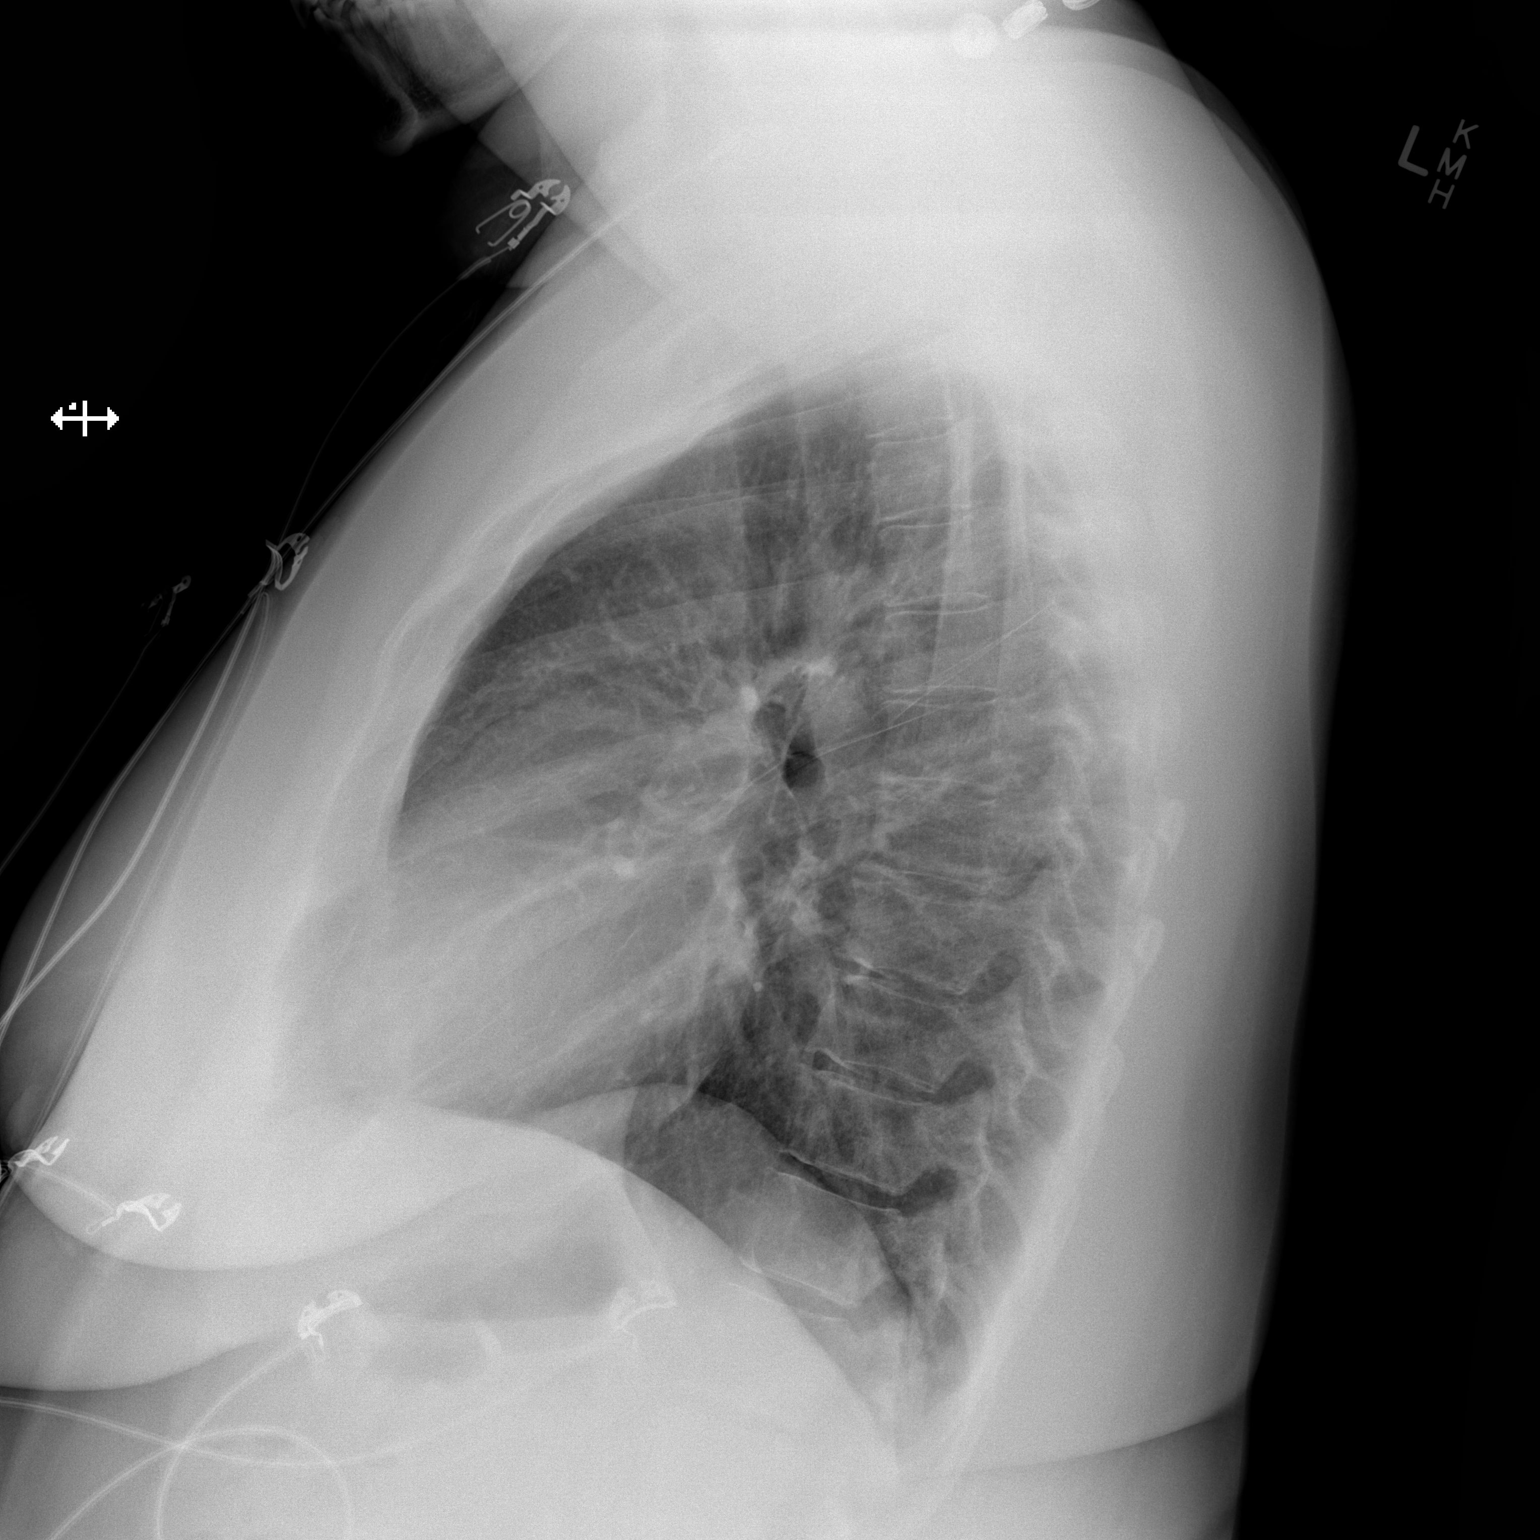

[2 of 2 positions shown; findings below may reference images not displayed]

FINDINGS: Normal heart size and mediastinal contours. No acute infiltrate or
edema. No effusion or pneumothorax. No acute osseous findings.
IMPRESSION: No active cardiopulmonary disease.

## 2015-04-22 ENCOUNTER — Ambulatory Visit (INDEPENDENT_AMBULATORY_CARE_PROVIDER_SITE_OTHER): Payer: No Typology Code available for payment source | Admitting: Obstetrics

## 2015-04-22 ENCOUNTER — Ambulatory Visit: Payer: No Typology Code available for payment source | Admitting: Obstetrics

## 2015-04-22 ENCOUNTER — Encounter: Payer: Self-pay | Admitting: Obstetrics

## 2015-04-22 VITALS — BP 112/77 | HR 78 | Temp 98.6°F | Wt 251.8 lb

## 2015-04-22 DIAGNOSIS — N8320 Unspecified ovarian cysts: Secondary | ICD-10-CM | POA: Diagnosis not present

## 2015-04-22 DIAGNOSIS — N946 Dysmenorrhea, unspecified: Secondary | ICD-10-CM

## 2015-04-22 DIAGNOSIS — N83202 Unspecified ovarian cyst, left side: Secondary | ICD-10-CM

## 2015-04-22 DIAGNOSIS — D251 Intramural leiomyoma of uterus: Secondary | ICD-10-CM | POA: Diagnosis not present

## 2015-04-22 LAB — POCT URINALYSIS DIPSTICK
Bilirubin, UA: NEGATIVE
Blood, UA: 50
Glucose, UA: NEGATIVE
Ketones, UA: NEGATIVE
LEUKOCYTES UA: NEGATIVE
Nitrite, UA: NEGATIVE
Protein, UA: NEGATIVE
Spec Grav, UA: 1.015
UROBILINOGEN UA: NEGATIVE
pH, UA: 5

## 2015-04-22 NOTE — Progress Notes (Signed)
Patient ID: Nicole [name], female   DOB: Jul 16, 1976, 39 y.o.   MRN: [id number]  Chief Complaint  Patient presents with  . Follow-up    HPI Nicole [name] is a 39 y.o. female.  Painful periods.  Has a few small intramural fibroids and a small simple left ovarian cyst on last ultrasound.  Has had tubal ligation done for contraception but was started on Depo Provera for dysmenorrhea.  Less pain since starting Depo that is managed well with Naprosyn.   HPI  Past Medical History  Diagnosis Date  . Anemia   . Asthma   . Blood transfusion 2011    r/t anemia  . Migraine   . Sickle cell trait   . Seizures     last sz Jan 2012  . Seizures   . Fibroid   . Tooth abscess   . Sickle cell trait   . Migraine     Past Surgical History  Procedure Laterality Date  . Tubal ligation Bilateral 2005    Family History  Problem Relation Age of Onset  . Anesthesia problems Neg Hx   . Hypertension Mother   . Diabetes Mother   . Cancer Mother     Colon, brain, 2 other  . Asthma Mother   . Clotting disorder Mother   . Heart disease Mother     64, stents  . Stroke Mother     2012  . Diabetes Father   . Asthma Father   . Clotting disorder Father   . Sickle cell anemia Father   . Heart disease Father     2000, stents  . Hypertension Father   . Stroke Father     2007  . Diabetes Maternal Grandmother   . Asthma Sister   . Asthma Brother   . Asthma Paternal Grandmother     Social History History  Substance Use Topics  . Smoking status: Former Smoker    Quit date: 03/27/2014  . Smokeless tobacco: Never Used  . Alcohol Use: No    Allergies  Allergen Reactions  . Penicillins Swelling  . Sulfa Antibiotics Rash    Current Outpatient Prescriptions  Medication Sig Dispense Refill  . albuterol (PROVENTIL HFA;VENTOLIN HFA) 108 (90 BASE) MCG/ACT inhaler Inhale 2 puffs into the lungs every 4 (four) hours as needed for wheezing or shortness of breath (or coughing). 1 Inhaler prn  .  diazepam (VALIUM) 5 MG tablet Take 1 tablet (5 mg total) by mouth every 8 (eight) hours as needed (vertigo). (Patient not taking: Reported on 03/11/2015) 20 tablet 0  . docusate sodium 100 MG CAPS Take 100 mg by mouth daily. 10 capsule 0  . doxycycline (VIBRAMYCIN) 100 MG capsule Take 1 capsule (100 mg total) by mouth 2 (two) times daily. 28 capsule 0  . fexofenadine (ALLEGRA) 180 MG tablet Take 1 tablet (180 mg total) by mouth daily. 30 tablet 3  . gabapentin (NEURONTIN) 300 MG capsule Take 1 capsule (300 mg total) by mouth 3 (three) times daily. (Patient taking differently: Take 500 mg by mouth 3 (three) times daily. ) 90 capsule 0  . ipratropium (ATROVENT) 0.06 % nasal spray Place 2 sprays into both nostrils 4 (four) times daily. 15 mL 0  . levETIRAcetam (KEPPRA) 1000 MG tablet Take 1,000 mg by mouth 2 (two) times daily.    Marland Kitchen levETIRAcetam (KEPPRA) 750 MG tablet TAKE 1 TABLET (750 MG TOTAL) BY MOUTH 2 (TWO) TIMES DAILY. (Patient not taking: Reported on 03/08/2015) 60 tablet 2  .  loratadine (CLARITIN) 10 MG tablet Take 1 tablet (10 mg total) by mouth daily. 30 tablet 11  . meclizine (ANTIVERT) 25 MG tablet Take 1 tablet (25 mg total) by mouth 3 (three) times daily as needed. (Patient taking differently: Take 25 mg by mouth 3 (three) times daily as needed for dizziness. ) 20 tablet 0  . medroxyPROGESTERone (DEPO-PROVERA) 150 MG/ML injection Inject 1 mL (150 mg total) into the muscle every 3 (three) months. Inject on  the 22nd of each month 1 mL 3  . naproxen sodium (ANAPROX DS) 550 MG tablet Take 1 tablet (550 mg total) by mouth 2 (two) times daily with a meal. 60 tablet 11  . omeprazole (PRILOSEC) 20 MG capsule Take 1 capsule (20 mg total) by mouth 2 (two) times daily before a meal. (Patient taking differently: Take 20 mg by mouth daily. ) 60 capsule 5  . ondansetron (ZOFRAN ODT) 4 MG disintegrating tablet Take 1 tablet (4 mg total) by mouth every 8 (eight) hours as needed for nausea. 6 tablet 0  .  oxyCODONE-acetaminophen (PERCOCET) 10-325 MG per tablet Take 1 tablet by mouth every 6 (six) hours as needed for pain. 40 tablet 0  . oxyCODONE-acetaminophen (PERCOCET/ROXICET) 5-325 MG per tablet Take 1-2 tablets by mouth every 6 (six) hours as needed for severe pain. 15 tablet 0  . Prenatal Vit-Fe Fumarate-FA (PNV PRENATAL PLUS MULTIVITAMIN) 27-1 MG TABS Take 1 tablet by mouth daily.    Marland Kitchen tinidazole (TINDAMAX) 500 MG tablet Take 2 tablets (1,000 mg total) by mouth daily with breakfast. 10 tablet 2  . topiramate (TOPAMAX) 25 MG tablet Take 25 mg by mouth 2 (two) times daily.     No current facility-administered medications for this visit.    Review of Systems Review of Systems Constitutional: negative for fatigue and weight loss Respiratory: negative for cough and wheezing Cardiovascular: negative for chest pain, fatigue and palpitations Gastrointestinal: negative for abdominal pain and change in bowel habits Genitourinary:negative Integument/breast: negative for nipple discharge Musculoskeletal:negative for myalgias Neurological: negative for gait problems and tremors Behavioral/Psych: negative for abusive relationship, depression Endocrine: negative for temperature intolerance     Blood pressure 112/77, pulse 78, temperature 98.6 F (37 C), weight 251 lb 12.8 oz (114.216 kg).  Physical Exam Physical Exam:      Abdomen:  Soft, non tender      Pelvis:  NEFG.  Uterus, ovaries and tubes normal size, and non tender.  Data Reviewed Ultrasound Labs  Assessment     Dysmenorrhea.  Improved.     Plan    Continue Depo Provera Continue Naprosyn F/U 6 months  Orders Placed This Encounter  Procedures  . POCT urinalysis dipstick   No orders of the defined types were placed in this encounter.

## 2015-04-27 ENCOUNTER — Telehealth: Payer: Self-pay | Admitting: *Deleted

## 2015-04-27 NOTE — Telephone Encounter (Signed)
Patient states she is bleeding clots again 4:25 LM on VM to CB

## 2015-05-03 IMAGING — CR DG ANKLE COMPLETE 3+V*R*
3 series · 3 of 3 positions shown · non-contrast
Comparison: None.

CLINICAL DATA: Right ankle pain and tenderness no known injury

EXAM:
RIGHT ANKLE - COMPLETE 3+ VIEW

[t ankle joint ap right]
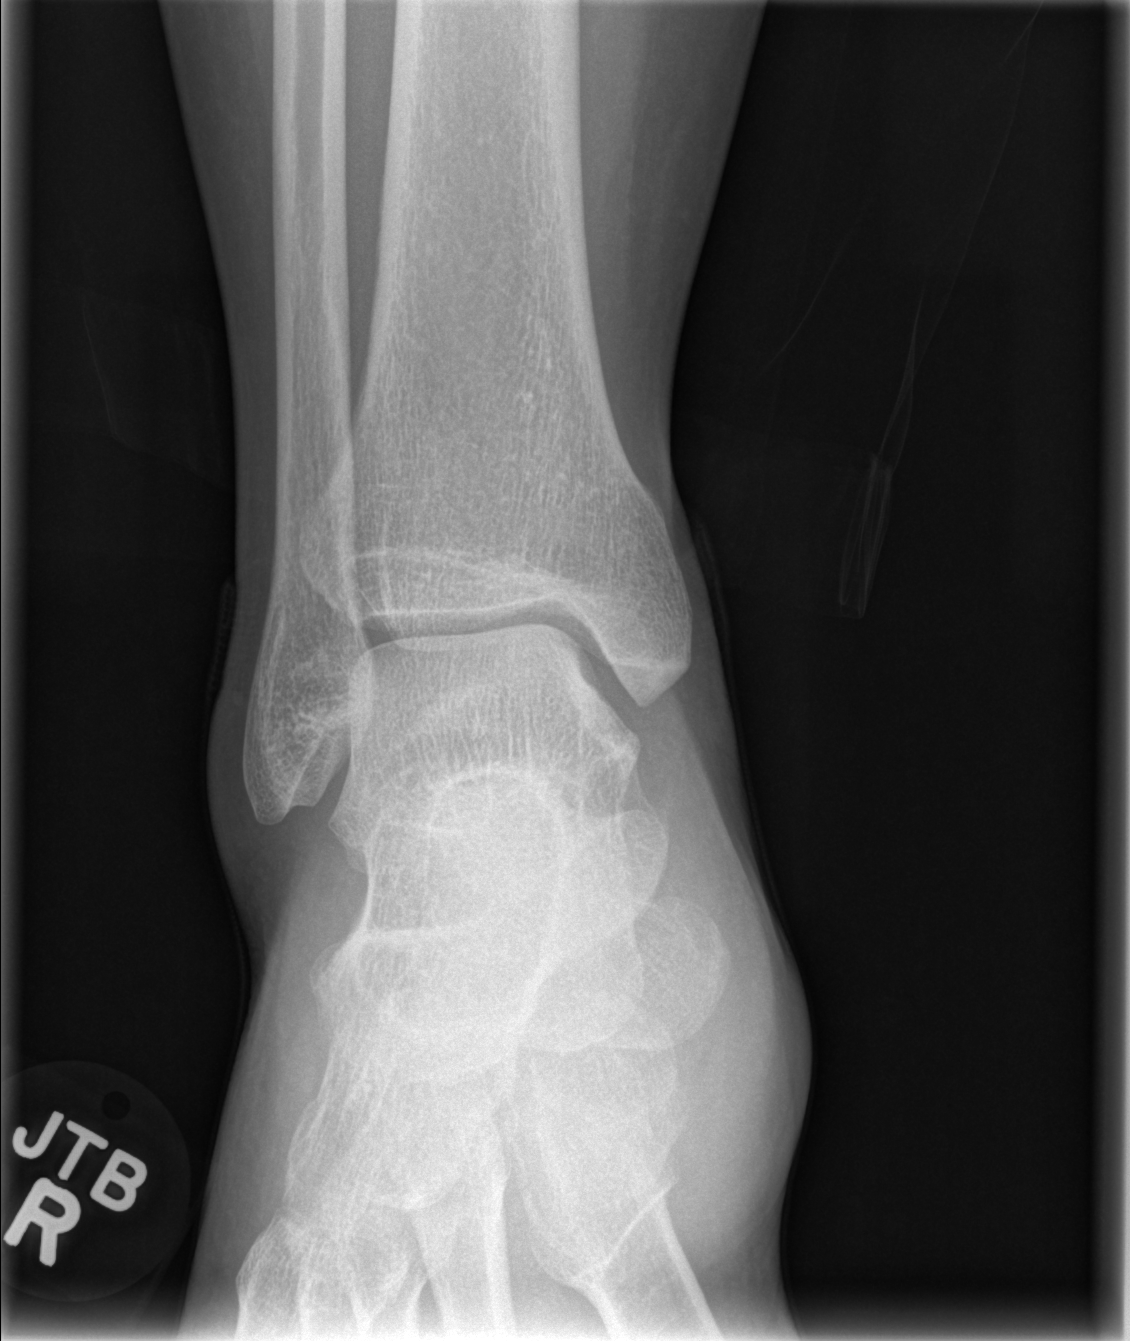

[t ankle joint oblique right]
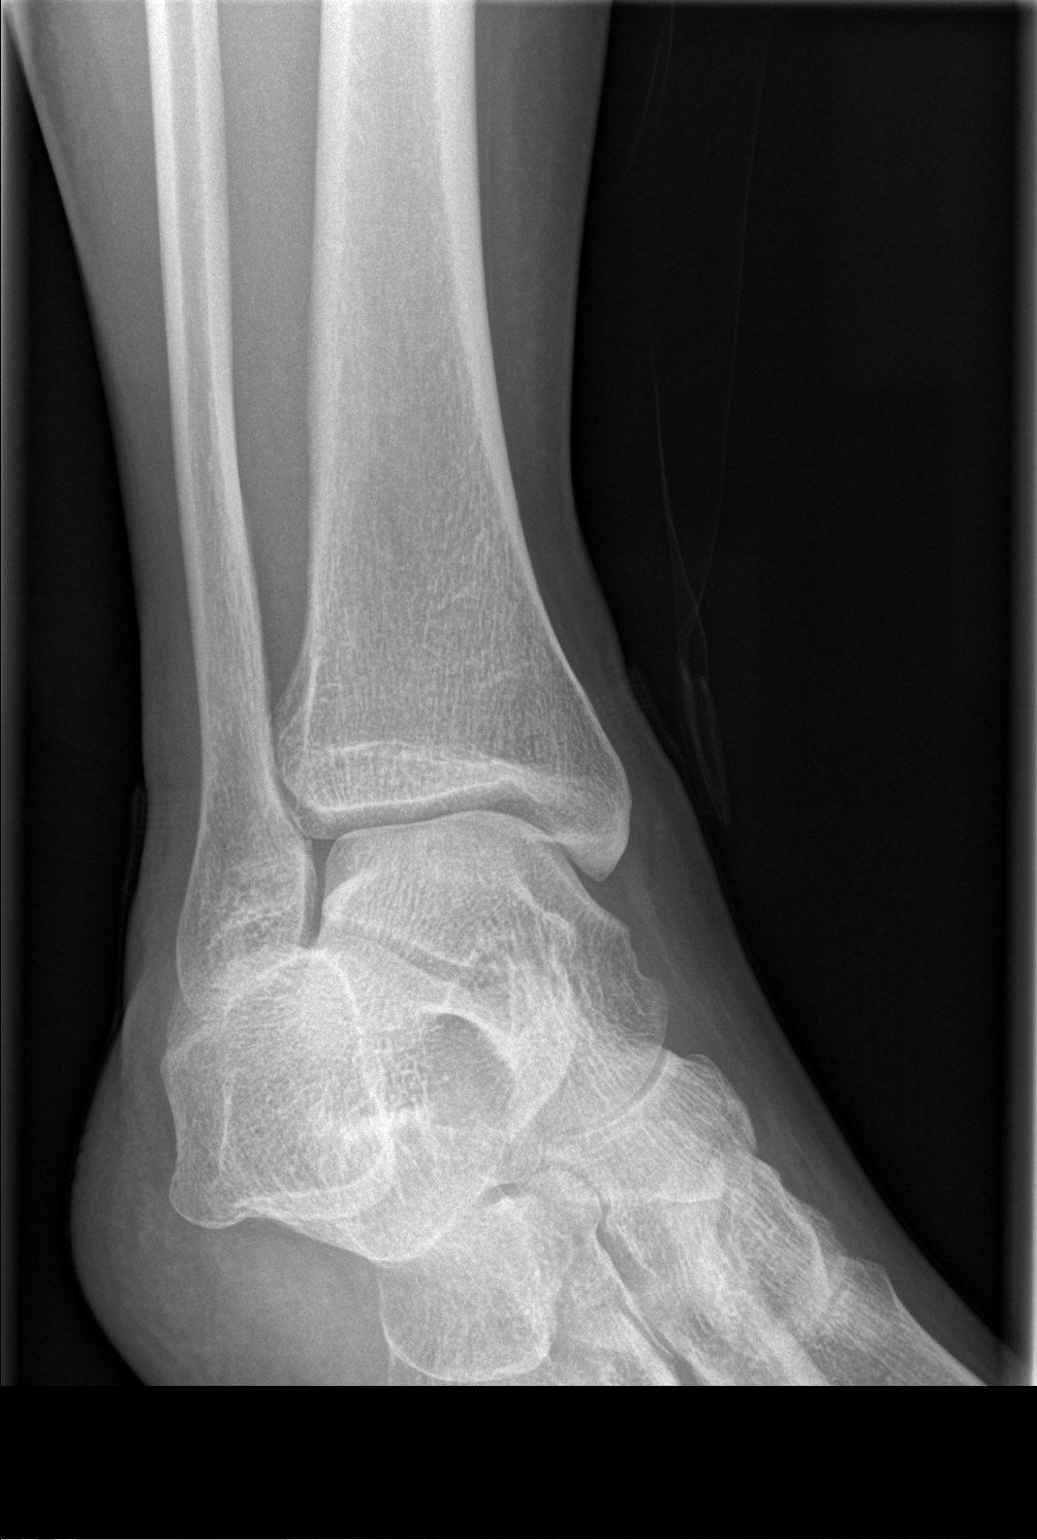

[t ankle joint lat right]
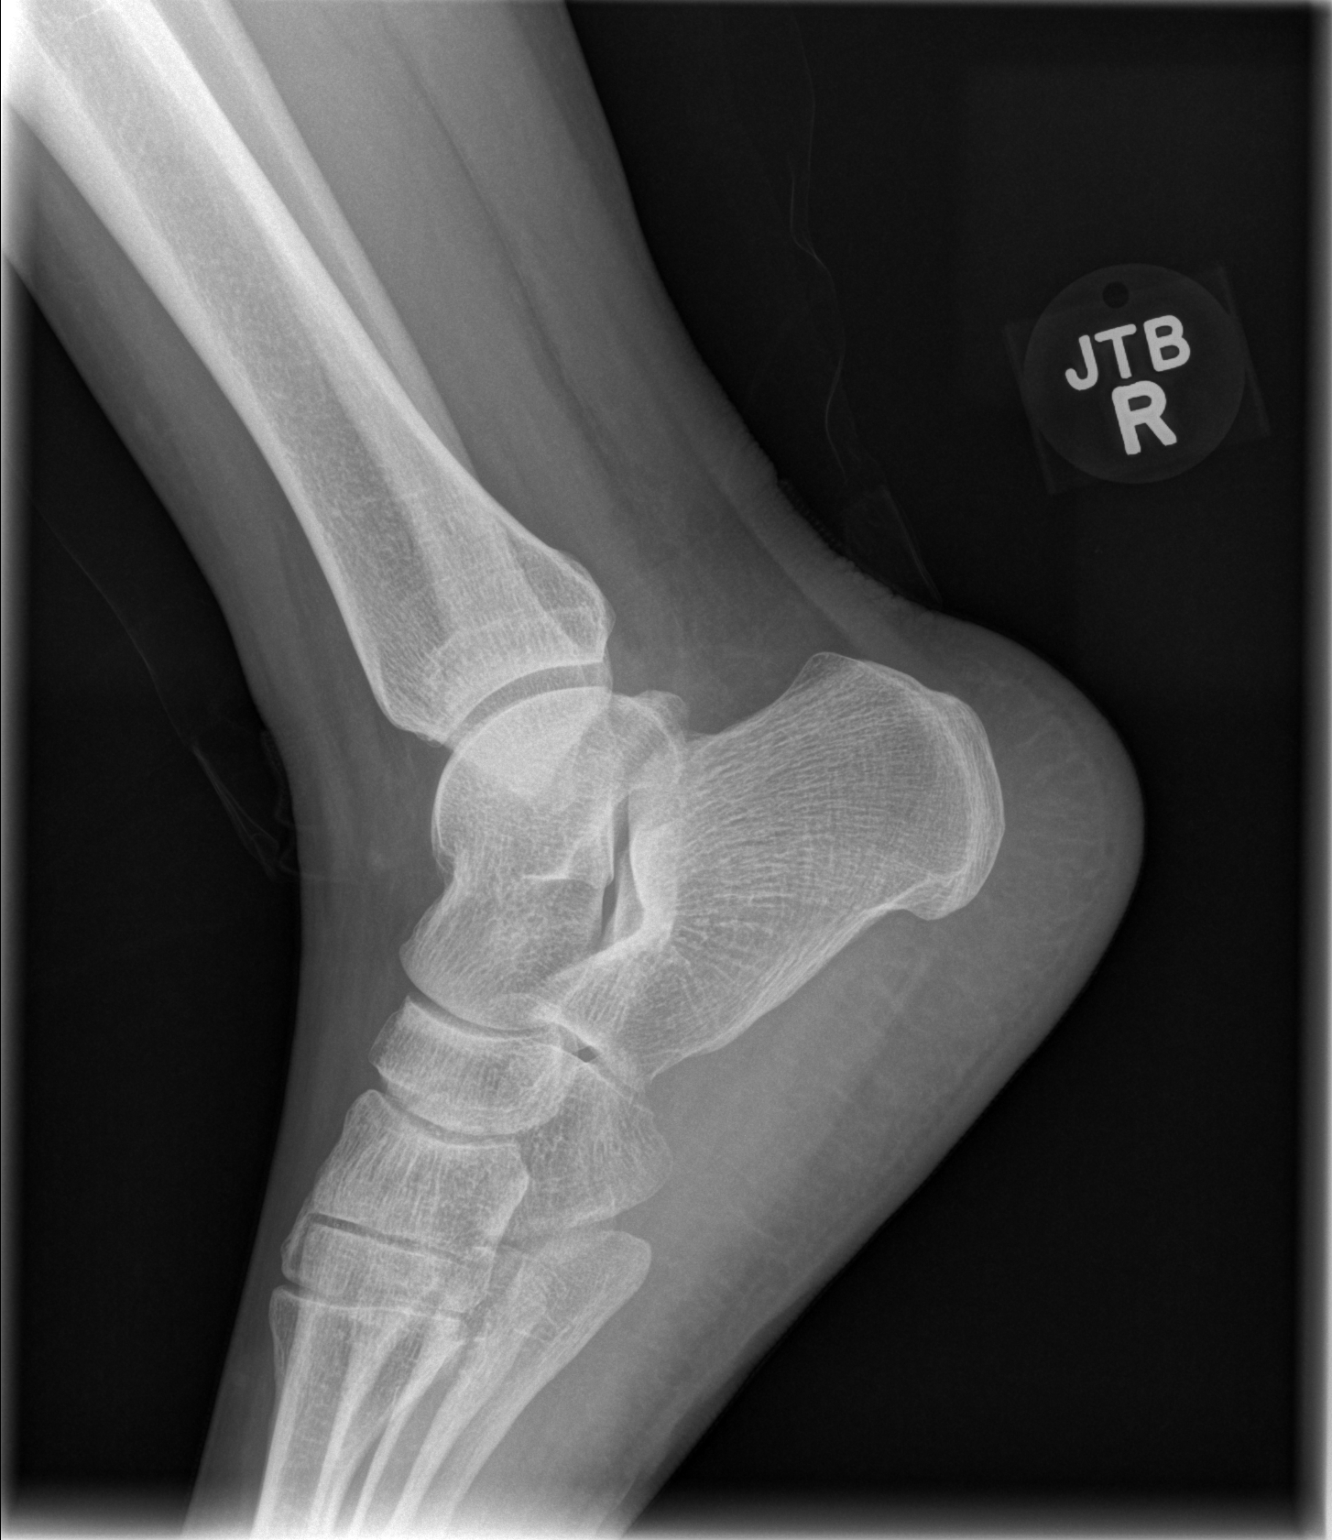

[3 of 3 positions shown; findings below may reference images not displayed]

FINDINGS: Three views of the right ankle submitted. No acute fracture or
subluxation. No radiopaque foreign body. Ankle mortise is preserved.
IMPRESSION: Negative.

## 2015-05-03 IMAGING — CR DG TIBIA/FIBULA 2V*R*
4 series · 4 of 4 positions shown · non-contrast
Comparison: None.

CLINICAL DATA: Right lower leg pain. Unable to bear weight. No
known injury.

EXAM:
RIGHT TIBIA AND FIBULA - 2 VIEW

[t tib/fib ap right (1 of 2)]
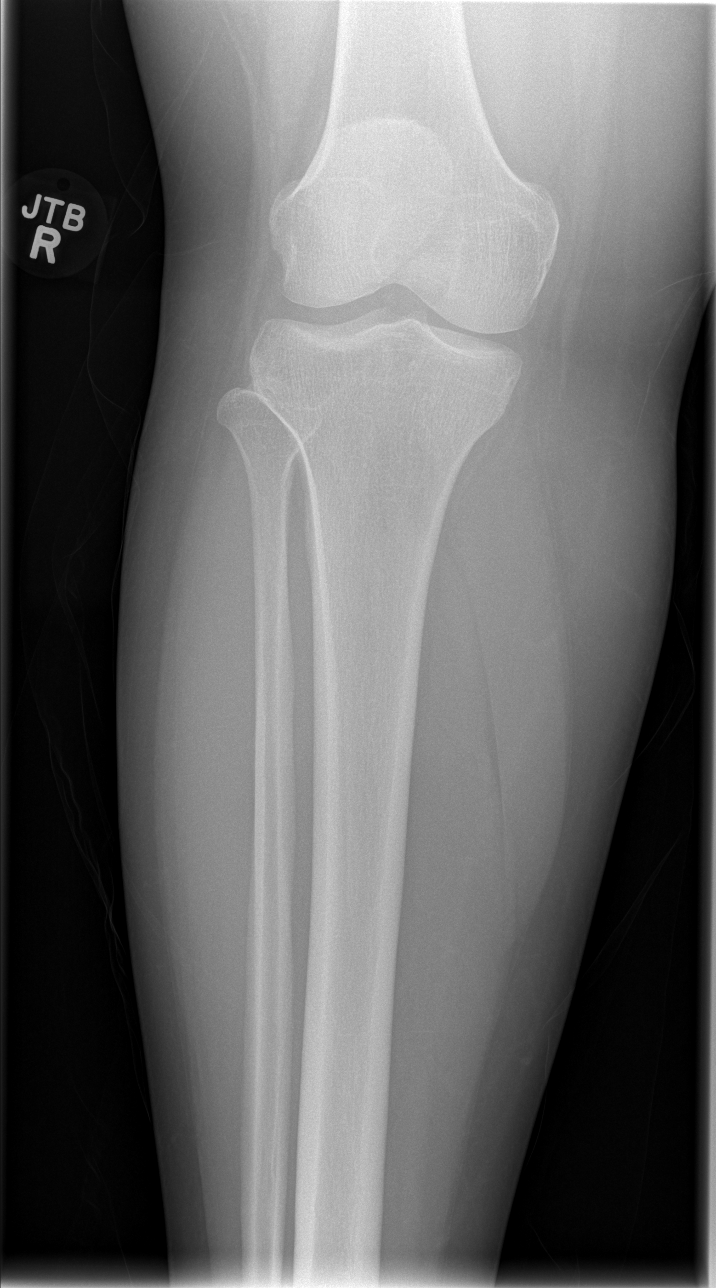

[t tib/fib ap right (2 of 2)]
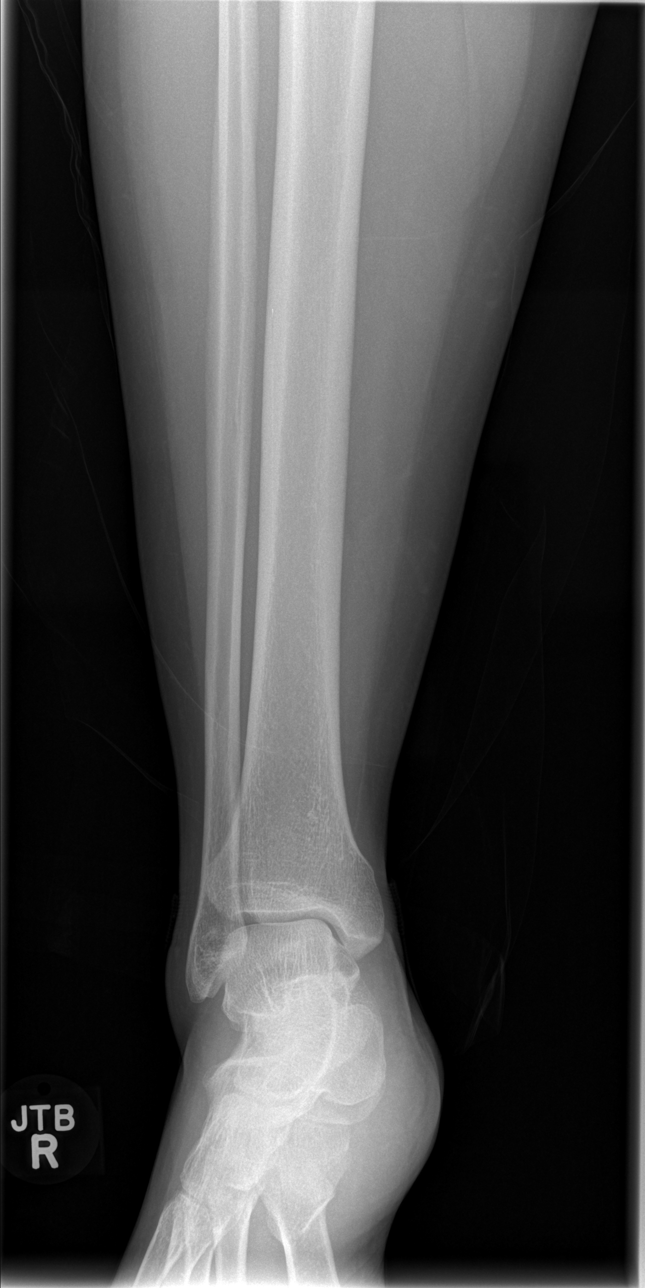

[t tib/fib lat right (1 of 2)]
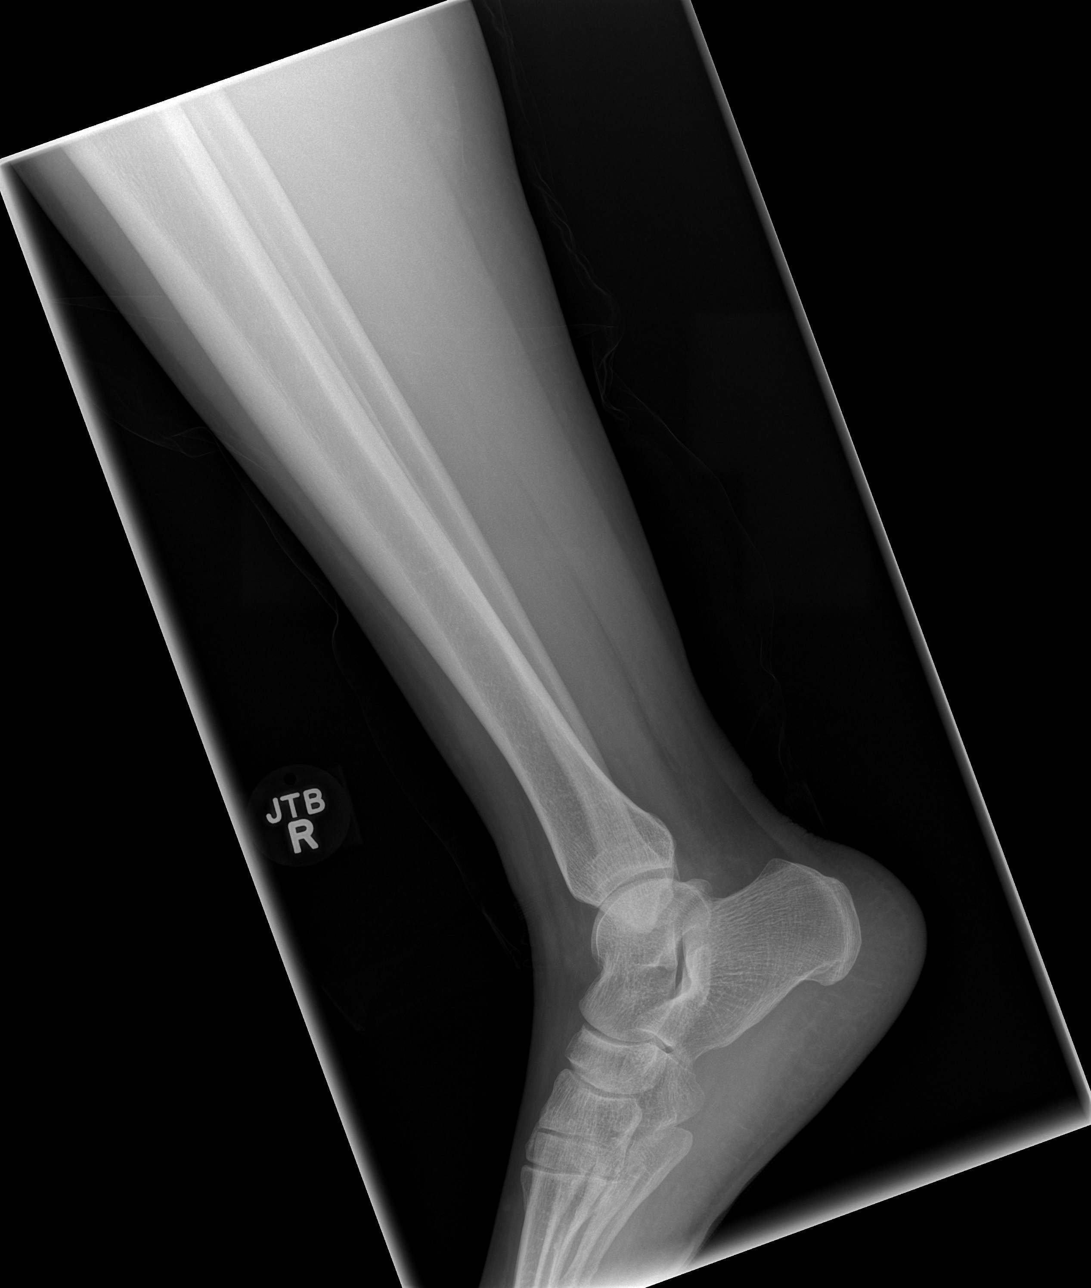

[t tib/fib lat right (2 of 2)]
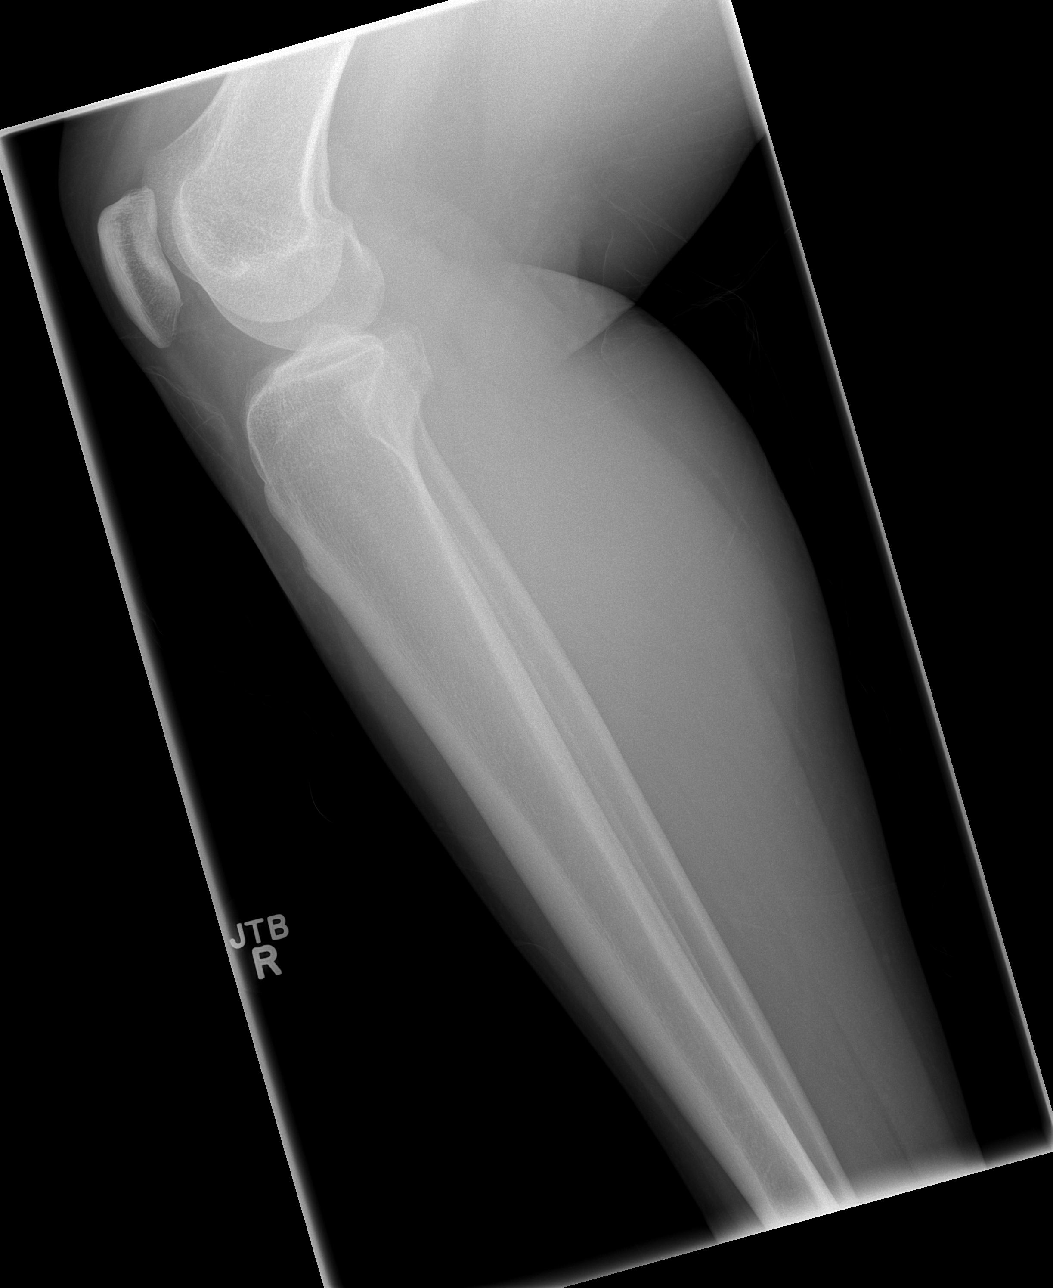

[4 of 4 positions shown; findings below may reference images not displayed]

FINDINGS: There is no evidence of fracture or other focal bone lesions. Soft
tissues are unremarkable.
IMPRESSION: Negative.

## 2015-05-10 ENCOUNTER — Telehealth: Payer: Self-pay | Admitting: Family Medicine

## 2015-05-10 NOTE — Telephone Encounter (Signed)
Family Medicine After hours phone call  Pt calls this morning complaining of "whole right hip pain". She states she has been followed by the neurologist recently and is on the "right" seizure medicines current, had a head scan done and says she is mostly "done" with this evaluation. About 2 days ago she started to have pain in her right hip. She denies having any trauma to the area or issue before. She has been able to walk on it. She tried taking ibuprofen 800mg  (of note took her almost 2 minutes to recall what medicine this was) which has not relieved the pain. She also reports a fever of 101F yesterday but it has not persisted. She currently can still walk on it but has to favor it. I discussed with her that I could not diagnose nor treat over the phone and that she should be evaluated by a doctor if the pain is getting worse, she could go to an urgent care or the ED. She asked if she absolutely had to go now, discussed that it is ultimately her decision but if she has pain that does not get better/some relief with OTC pain medicines should go be seen. Told her she could also take tylenol to try and help with the pain, or cold/warm compresses. If pt does not go to UC or ED, I told her she should call the clinic tomorrow morning and get an appointment to be seen. Pt appreciative and no further questions.  Tawanna Sat, MD 05/10/2015, 8:04 AM PGY-2, [place]

## 2015-05-12 ENCOUNTER — Ambulatory Visit: Payer: No Typology Code available for payment source | Admitting: Family Medicine

## 2015-05-31 ENCOUNTER — Ambulatory Visit: Payer: No Typology Code available for payment source

## 2015-06-03 ENCOUNTER — Ambulatory Visit: Payer: Self-pay | Admitting: Obstetrics

## 2015-06-21 ENCOUNTER — Encounter (HOSPITAL_COMMUNITY): Payer: Self-pay | Admitting: Emergency Medicine

## 2015-06-21 ENCOUNTER — Emergency Department (HOSPITAL_COMMUNITY)
Admission: EM | Admit: 2015-06-21 | Discharge: 2015-06-21 | Disposition: A | Discharge status: Home / Self Care | Payer: No Typology Code available for payment source | Attending: Emergency Medicine | Admitting: Emergency Medicine

## 2015-06-21 DIAGNOSIS — Z86018 Personal history of other benign neoplasm: Secondary | ICD-10-CM | POA: Insufficient documentation

## 2015-06-21 DIAGNOSIS — Z87891 Personal history of nicotine dependence: Secondary | ICD-10-CM | POA: Diagnosis not present

## 2015-06-21 DIAGNOSIS — D649 Anemia, unspecified: Secondary | ICD-10-CM | POA: Insufficient documentation

## 2015-06-21 DIAGNOSIS — R569 Unspecified convulsions: Secondary | ICD-10-CM | POA: Diagnosis present

## 2015-06-21 DIAGNOSIS — Z79899 Other long term (current) drug therapy: Secondary | ICD-10-CM | POA: Diagnosis not present

## 2015-06-21 DIAGNOSIS — Z8719 Personal history of other diseases of the digestive system: Secondary | ICD-10-CM | POA: Diagnosis not present

## 2015-06-21 DIAGNOSIS — G40909 Epilepsy, unspecified, not intractable, without status epilepticus: Secondary | ICD-10-CM | POA: Diagnosis not present

## 2015-06-21 DIAGNOSIS — Z792 Long term (current) use of antibiotics: Secondary | ICD-10-CM | POA: Insufficient documentation

## 2015-06-21 DIAGNOSIS — J45909 Unspecified asthma, uncomplicated: Secondary | ICD-10-CM | POA: Insufficient documentation

## 2015-06-21 DIAGNOSIS — Z880 Allergy status to penicillin: Secondary | ICD-10-CM | POA: Insufficient documentation

## 2015-06-21 DIAGNOSIS — G43909 Migraine, unspecified, not intractable, without status migrainosus: Secondary | ICD-10-CM | POA: Diagnosis not present

## 2015-06-21 LAB — I-STAT CHEM 8, ED
BUN: 4 mg/dL — ABNORMAL LOW (ref 6–20)
Calcium, Ion: 1.22 mmol/L (ref 1.12–1.23)
Chloride: 112 mmol/L — ABNORMAL HIGH (ref 101–111)
Creatinine, Ser: 0.8 mg/dL (ref 0.44–1.00)
Glucose, Bld: 88 mg/dL (ref 65–99)
HCT: 41 % (ref 36.0–46.0)
Hemoglobin: 13.9 g/dL (ref 12.0–15.0)
Potassium: 3.9 mmol/L (ref 3.5–5.1)
Sodium: 142 mmol/L (ref 135–145)
TCO2: 17 mmol/L (ref 0–100)

## 2015-06-21 LAB — BASIC METABOLIC PANEL
Anion gap: 8 (ref 5–15)
BUN: 6 mg/dL (ref 6–20)
CO2: 19 mmol/L — ABNORMAL LOW (ref 22–32)
Calcium: 9 mg/dL (ref 8.9–10.3)
Chloride: 111 mmol/L (ref 101–111)
Creatinine, Ser: 0.87 mg/dL (ref 0.44–1.00)
GFR calc Af Amer: >60 mL/min (ref >60)
GFR calc non Af Amer: >60 mL/min (ref >60)
Glucose, Bld: 91 mg/dL (ref 65–99)
Potassium: 3.9 mmol/L (ref 3.5–5.1)
Sodium: 138 mmol/L (ref 135–145)

## 2015-06-21 LAB — CBG MONITORING, ED: Glucose-Capillary: 100 mg/dL — ABNORMAL HIGH (ref 65–99)

## 2015-06-21 NOTE — ED Notes (Signed)
Per EMS. Pt's mother reports pt had back to back seizures this am. Pt slightly post-ictal on arrival. Pt had some soreness on her tongue. Pt on Keppra for seizures, has taken as prescribed. Feels she has been having seizures more often recently. Pt alert and oriented, but feels her memory has not been as good has normal recently.

## 2015-06-21 NOTE — ED Notes (Signed)
Attempted to draw labs. Unable to obtain enough for testing. RN  Rachael notified. She called main Phlebotomy to come and look.

## 2015-06-21 NOTE — ED Notes (Addendum)
MD at bedside. 

## 2015-06-21 NOTE — ED Notes (Addendum)
Phlebotomy at bedside.

## 2015-06-21 NOTE — Discharge Instructions (Signed)
Seizure, Adult °A seizure is abnormal electrical activity in the brain. Seizures usually last from 30 seconds to 2 minutes. There are various types of seizures. °Before a seizure, you may have a warning sensation (aura) that a seizure is about to occur. An aura may include the following symptoms:  °· Fear or anxiety. °· Nausea. °· Feeling like the room is spinning (vertigo). °· Vision changes, such as seeing flashing lights or spots. °Common symptoms during a seizure include: °· A change in attention or behavior (altered mental status). °· Convulsions with rhythmic jerking movements. °· Drooling. °· Rapid eye movements. °· Grunting. °· Loss of bladder and bowel control. °· Bitter taste in the mouth. °· Tongue biting. °After a seizure, you may feel confused and sleepy. You may also have an injury resulting from convulsions during the seizure. °HOME CARE INSTRUCTIONS  °· If you are given medicines, take them exactly as prescribed by your health care provider. °· Keep all follow-up appointments as directed by your health care provider. °· Do not swim or drive or engage in risky activity during which a seizure could cause further injury to you or others until your health care provider says it is OK. °· Get adequate rest. °· Teach friends and family what to do if you have a seizure. They should: °· Lay you on the ground to prevent a fall. °· Put a cushion under your head. °· Loosen any tight clothing around your neck. °· Turn you on your side. If vomiting occurs, this helps keep your airway clear. °· Stay with you until you recover. °· Know whether or not you need emergency care. °SEEK IMMEDIATE MEDICAL CARE IF: °· The seizure lasts longer than 5 minutes. °· The seizure is severe or you do not wake up immediately after the seizure. °· You have an altered mental status after the seizure. °· You are having more frequent or worsening seizures. °Someone should drive you to the emergency department or call local emergency  services (911 in [place] °MAKE SURE YOU: °· Understand these instructions. °· Will watch your condition. °· Will get help right away if you are not doing well or get worse. °Document Released: 11/24/2000 Document Revised: 09/17/2013 Document Reviewed: 07/09/2013 °[employer]® Patient Information ©2015 [employer]. This information is not intended to replace advice given to you by your health care provider. Make sure you discuss any questions you have with your health care provider. ° °Driving and Equipment Restrictions °Some medical problems make it dangerous to drive, ride a bike, or use machines. Some of these problems are: °· A hard blow to the head (concussion). °· Passing out (fainting). °· Twitching and shaking (seizures). °· Low blood sugar. °· Taking medicine to help you relax (sedatives). °· Taking pain medicines. °· Wearing an eye patch. °· Wearing splints. This can make it hard to use parts of your body that you need to drive safely. °HOME CARE  °· Do not drive until your doctor says it is okay. °· Do not use machines until your doctor says it is okay. °You may need a form signed by your doctor (medical release) before you can drive again. You may also need this form before you do other tasks where you need to be fully alert. °MAKE SURE YOU: °· Understand these instructions. °· Will watch your condition. °· Will get help right away if you are not doing well or get worse. °Document Released: 01/04/2005 Document Revised: 02/19/2012 Document Reviewed: 04/06/2010 °[employer]® Patient Information ©2015 [employer].   This information is not intended to replace advice given to you by your health care provider. Make sure you discuss any questions you have with your health care provider. ° °

## 2015-06-26 IMAGING — CR DG CHEST 2V
2 series · 2 of 2 positions shown · non-contrast
Comparison: 08/02/2014

CLINICAL DATA: Cough. Shortness of breath. Mid chest pain for 1
week, worsening over the past few days. History of asthma. Previous
smoker.

EXAM:
CHEST  2 VIEW

[w chest pa]
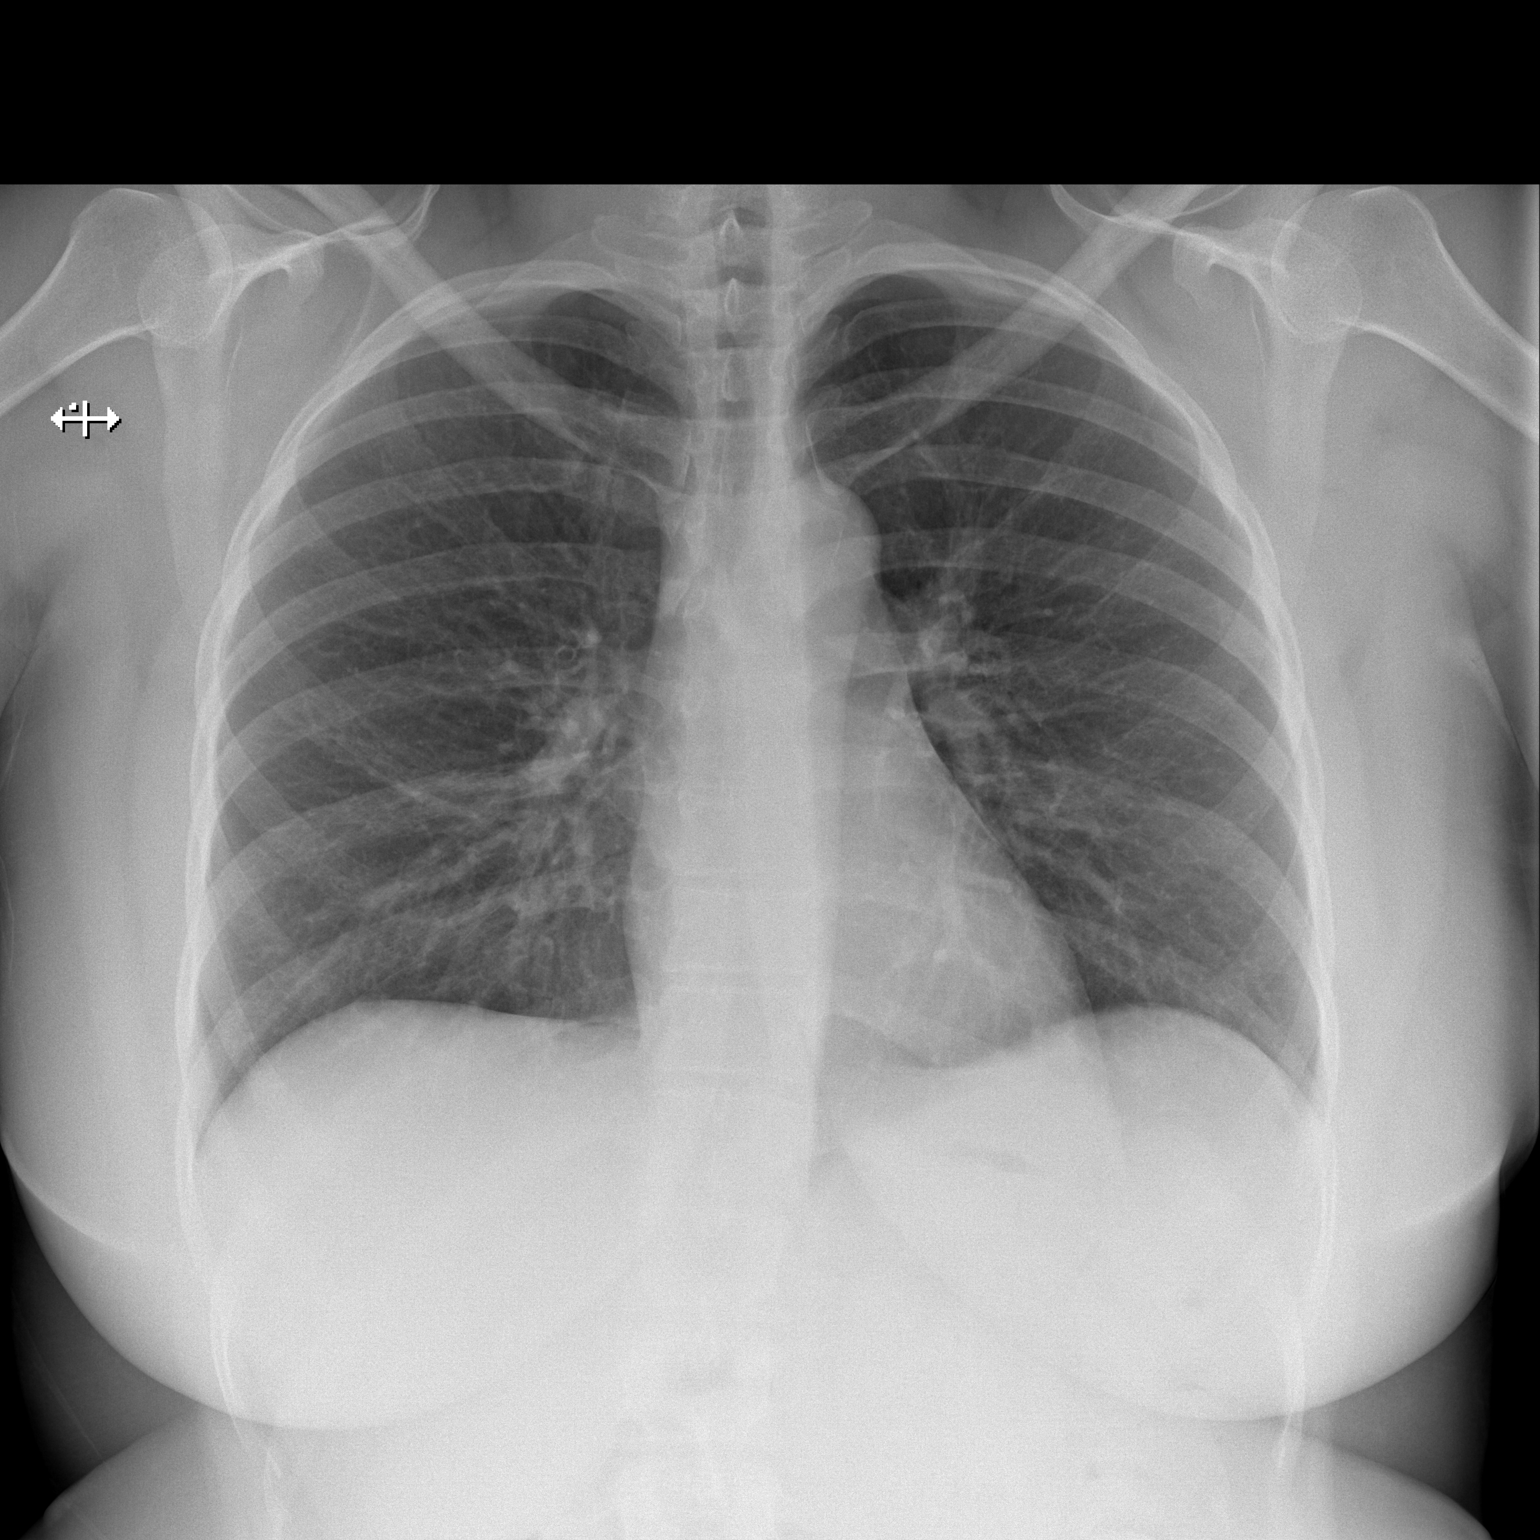

[w chest lat]
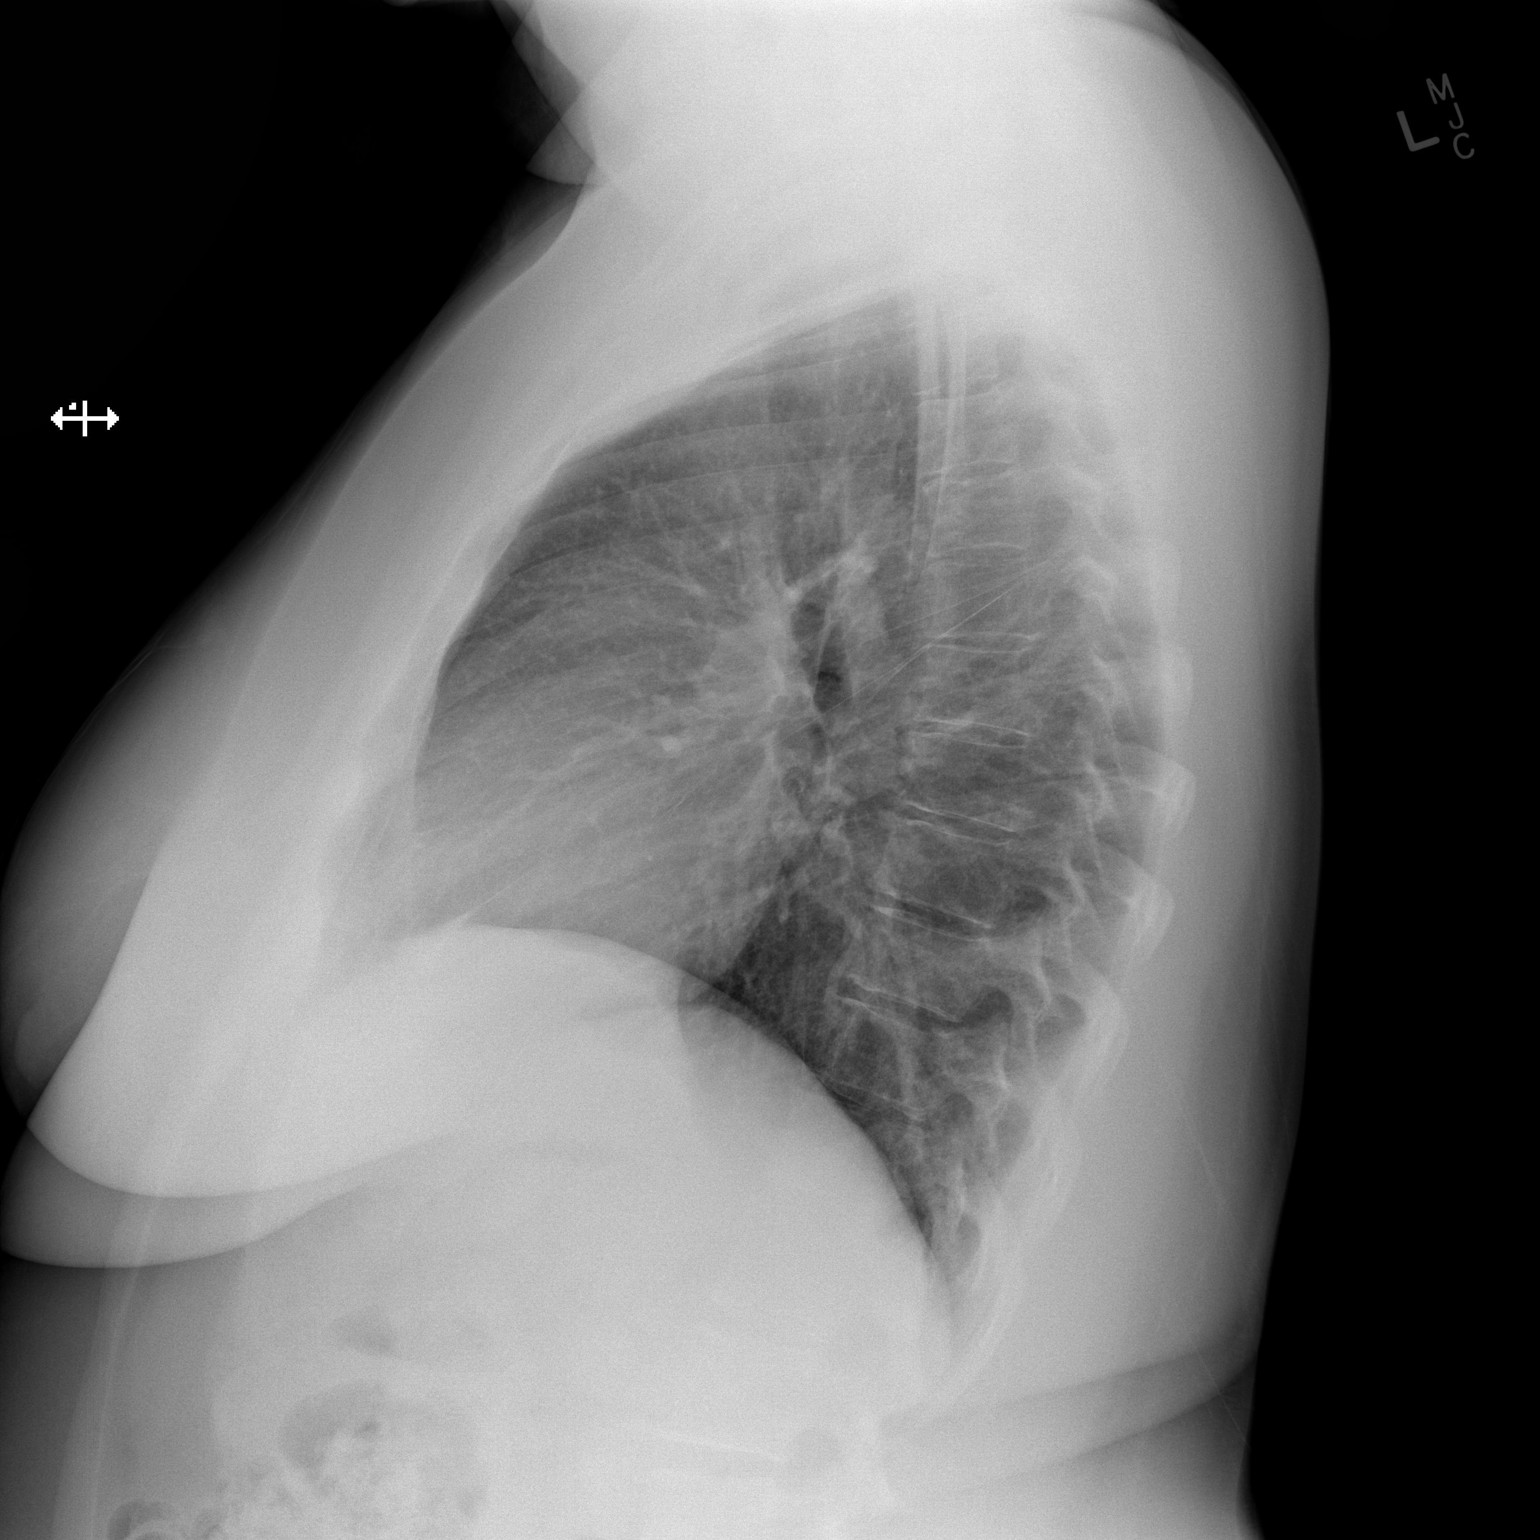

[2 of 2 positions shown; findings below may reference images not displayed]

FINDINGS: The heart size and mediastinal contours are within normal limits.
Both lungs are clear. The visualized skeletal structures are
unremarkable.
IMPRESSION: No active cardiopulmonary disease.

## 2015-06-26 NOTE — ED Provider Notes (Signed)
CSN: [id number]     Arrival date & time 06/21/15  0725 History   First MD Initiated Contact with Patient 06/21/15 0730     Chief Complaint  Patient presents with  . Seizures     (Consider location/radiation/quality/duration/timing/severity/associated sxs/prior Treatment) HPI   39 year old female with seizure. Patient had a witnessed seizure-like activity earlier this morning. Witnessed by family. Patient has a past history of seizures. He seemed to be pretty well controlled but she feels like she's having them more frequently over the past couple months. Reports compliance with her medications. No fever or chills. Denies any ingestion. Her tongue feels sore she has a mild headache. Feels somewhat groggy. No other acute complaints.  Past Medical History  Diagnosis Date  . Anemia   . Asthma   . Blood transfusion 2011    r/t anemia  . Migraine   . Sickle cell trait   . Seizures     last sz Jan 2012  . Seizures   . Fibroid   . Tooth abscess   . Sickle cell trait   . Migraine    Past Surgical History  Procedure Laterality Date  . Tubal ligation Bilateral 2005   Family History  Problem Relation Age of Onset  . Anesthesia problems Neg Hx   . Hypertension Mother   . Diabetes Mother   . Cancer Mother     Colon, brain, 2 other  . Asthma Mother   . Clotting disorder Mother   . Heart disease Mother     37, stents  . Stroke Mother     2012  . Diabetes Father   . Asthma Father   . Clotting disorder Father   . Sickle cell anemia Father   . Heart disease Father     2000, stents  . Hypertension Father   . Stroke Father     2007  . Diabetes Maternal Grandmother   . Asthma Sister   . Asthma Brother   . Asthma Paternal Grandmother    History  Substance Use Topics  . Smoking status: Former Smoker    Quit date: 03/27/2014  . Smokeless tobacco: Never Used  . Alcohol Use: No   OB History    Gravida Para Term Preterm AB TAB SAB Ectopic Multiple Living   3 2 1 1 1  0 0 1  0 2     Review of Systems  All systems reviewed and negative, other than as noted in HPI.   Allergies  Shrimp; Tea; Penicillins; and Sulfa antibiotics  Home Medications   Prior to Admission medications   Medication Sig Start Date End Date Taking? Authorizing Provider  albuterol (PROVENTIL HFA;VENTOLIN HFA) 108 (90 BASE) MCG/ACT inhaler Inhale 2 puffs into the lungs every 4 (four) hours as needed for wheezing or shortness of breath (or coughing). 03/11/15  Yes Shelly Bombard, MD  clonazePAM (KLONOPIN) 1 MG tablet Take 1 mg by mouth 2 (two) times daily.   Yes Historical Provider, MD  estradiol cypionate (DEPO-ESTRADIOL) 5 MG/ML injection Inject 2 mg into the muscle every 28 (twenty-eight) days.   Yes Historical Provider, MD  fexofenadine (ALLEGRA) 180 MG tablet Take 1 tablet (180 mg total) by mouth daily. 08/13/14  Yes Leone Brand, MD  gabapentin (NEURONTIN) 300 MG capsule Take 1 capsule (300 mg total) by mouth 3 (three) times daily. Patient taking differently: Take 500 mg by mouth 3 (three) times daily.  01/25/15  Yes Tanna Furry, MD  levETIRAcetam (KEPPRA) 1000 MG tablet Take  1,000-2,000 mg by mouth 2 (two) times daily. TAKE ONE TABLET IN THE AM WITH BREAKFAST AND TWO AT NIGHT   Yes Historical Provider, MD  loratadine (CLARITIN) 10 MG tablet Take 1 tablet (10 mg total) by mouth daily. 03/11/15  Yes Shelly Bombard, MD  medroxyPROGESTERone (DEPO-PROVERA) 150 MG/ML injection Inject 1 mL (150 mg total) into the muscle every 3 (three) months. Inject on  the 22nd of each month 03/11/15  Yes Shelly Bombard, MD  naproxen sodium (ANAPROX DS) 550 MG tablet Take 1 tablet (550 mg total) by mouth 2 (two) times daily with a meal. Patient taking differently: Take 550 mg by mouth as needed.  03/11/15  Yes Shelly Bombard, MD  omeprazole (PRILOSEC) 20 MG capsule Take 1 capsule (20 mg total) by mouth 2 (two) times daily before a meal. Patient taking differently: Take 20 mg by mouth daily.  01/28/14  Yes  Shelly Bombard, MD  oxyCODONE-acetaminophen (PERCOCET) 10-325 MG per tablet Take 1 tablet by mouth every 6 (six) hours as needed for pain. 03/11/15  Yes Shelly Bombard, MD  Prenatal Vit-Fe Fumarate-FA (PNV PRENATAL PLUS MULTIVITAMIN) 27-1 MG TABS Take 1 tablet by mouth daily. 07/22/14  Yes Historical Provider, MD  topiramate (TOPAMAX) 25 MG tablet Take 25 mg by mouth 2 (two) times daily.   Yes Historical Provider, MD  diazepam (VALIUM) 5 MG tablet Take 1 tablet (5 mg total) by mouth every 8 (eight) hours as needed (vertigo). Patient not taking: Reported on 03/11/2015 01/24/15   Tanna Furry, MD  docusate sodium 100 MG CAPS Take 100 mg by mouth daily. Patient not taking: Reported on 06/21/2015 11/20/14   [name], DO  doxycycline (VIBRAMYCIN) 100 MG capsule Take 1 capsule (100 mg total) by mouth 2 (two) times daily. Patient not taking: Reported on 06/21/2015 03/05/15   Debby Freiberg, MD  ipratropium (ATROVENT) 0.06 % nasal spray Place 2 sprays into both nostrils 4 (four) times daily. Patient not taking: Reported on 06/21/2015 10/25/14   Hilton Sinclair, MD  levETIRAcetam (KEPPRA) 750 MG tablet TAKE 1 TABLET (750 MG TOTAL) BY MOUTH 2 (TWO) TIMES DAILY. Patient not taking: Reported on 03/08/2015 12/14/14   Leone Brand, MD  meclizine (ANTIVERT) 25 MG tablet Take 1 tablet (25 mg total) by mouth 3 (three) times daily as needed. Patient not taking: Reported on 06/21/2015 01/24/15   Tanna Furry, MD  ondansetron (ZOFRAN ODT) 4 MG disintegrating tablet Take 1 tablet (4 mg total) by mouth every 8 (eight) hours as needed for nausea. Patient not taking: Reported on 06/21/2015 01/24/15   Tanna Furry, MD  oxyCODONE-acetaminophen (PERCOCET/ROXICET) 5-325 MG per tablet Take 1-2 tablets by mouth every 6 (six) hours as needed for severe pain. Patient not taking: Reported on 06/21/2015 03/08/15   Luvenia Redden, PA-C  tinidazole (TINDAMAX) 500 MG tablet Take 2 tablets (1,000 mg total) by mouth daily with  breakfast. Patient not taking: Reported on 06/21/2015 03/11/15   Shelly Bombard, MD   BP 106/66 mmHg  Pulse 74  Temp(Src) 98.1 F (36.7 C) (Oral)  Resp 16  SpO2 98% Physical Exam  Constitutional: She is oriented to person, place, and time. She appears well-developed and well-nourished. No distress.  HENT:  Head: Normocephalic and atraumatic.  No tongue or other oral trauma noted.  Eyes: Conjunctivae and EOM are normal. Pupils are equal, round, and reactive to light. Right eye exhibits no discharge. Left eye exhibits no discharge.  Neck: Neck supple.  No nuchal rigidity  Cardiovascular: Normal rate, regular rhythm and normal heart sounds.  Exam reveals no gallop and no friction rub.   No murmur heard. Pulmonary/Chest: Effort normal and breath sounds normal. No respiratory distress.  Abdominal: Soft. She exhibits no distension. There is no tenderness.  Musculoskeletal: She exhibits no edema or tenderness.  Neurological: She is alert and oriented to person, place, and time. No cranial nerve deficit. She exhibits normal muscle tone. Coordination normal.  Speech clear. Content appropriate. Follows commands. Finger-to-nose testing bilaterally. Gait is steady.  Skin: Skin is warm and dry.  Psychiatric: She has a normal mood and affect. Her behavior is normal. Thought content normal.  Nursing note and vitals reviewed.   ED Course  Procedures (including critical care time) Labs Review Labs Reviewed  BASIC METABOLIC PANEL - Abnormal; Notable for the following:    CO2 19 (*)    All other components within normal limits  CBG MONITORING, ED - Abnormal; Notable for the following:    Glucose-Capillary 100 (*)    All other components within normal limits  I-STAT CHEM 8, ED - Abnormal; Notable for the following:    Chloride 112 (*)    BUN 4 (*)    All other components within normal limits    Imaging Review No results found.   EKG Interpretation None      MDM   Final diagnoses:   Seizure    39yF with seizure Hx of the same. Back to her baseline. No further complaints. Afebrile. Nonfocal neuro exam. It has been determined that no acute conditions requiring further emergency intervention are present at this time. The patient has been advised of the diagnosis and plan. I reviewed any labs and imaging including any potential incidental findings. We have discussed signs and symptoms that warrant return to the ED and they are listed in the discharge instructions.    Virgel Manifold, MD 06/26/15 [phone]

## 2015-07-01 NOTE — Telephone Encounter (Signed)
No response- refile call

## 2015-07-08 IMAGING — DX DG CHEST 2V
2 series · 2 of 2 positions shown · non-contrast
Comparison: 10/28/2014

CLINICAL DATA: Cough, shortness of breath and fever. Initial
encounter.

EXAM:
CHEST  2 VIEW

[chest pa]
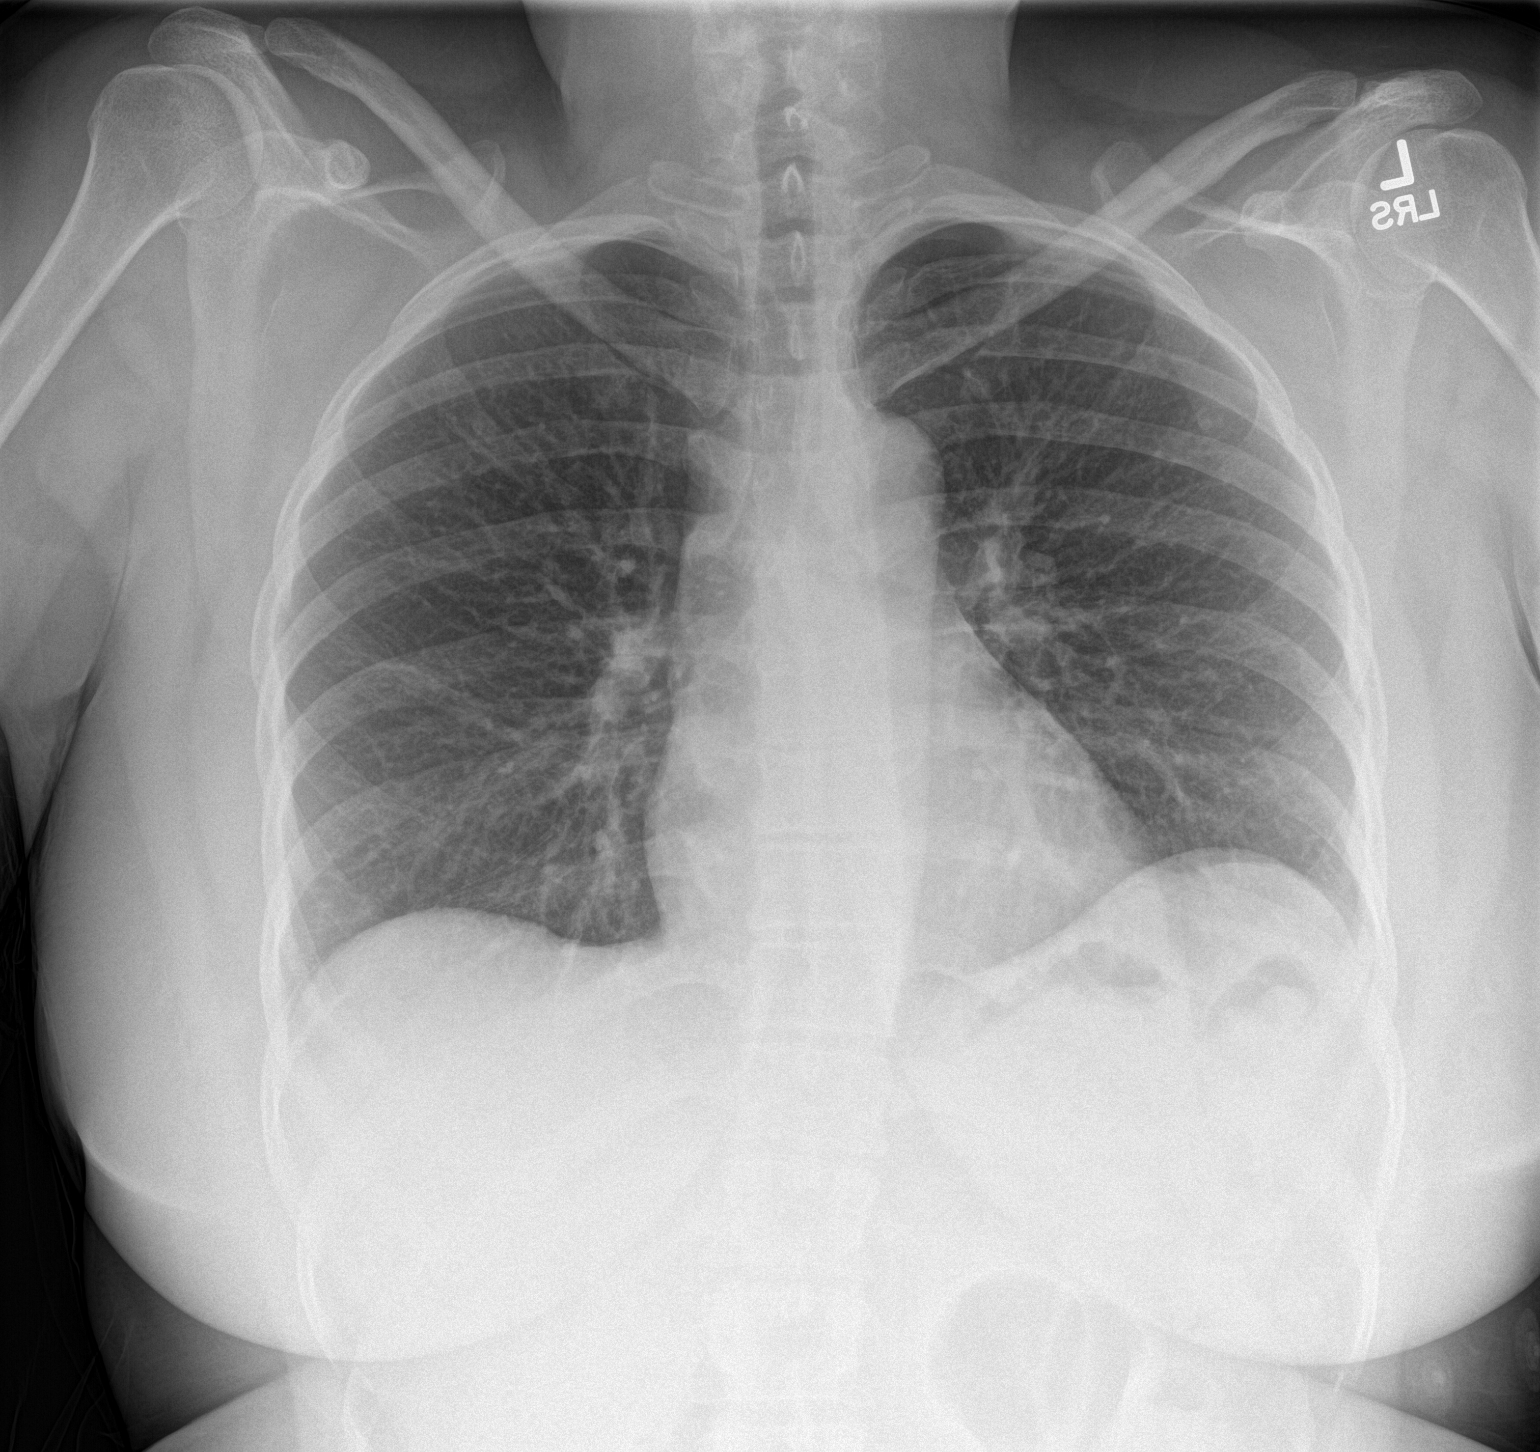

[chest lat]
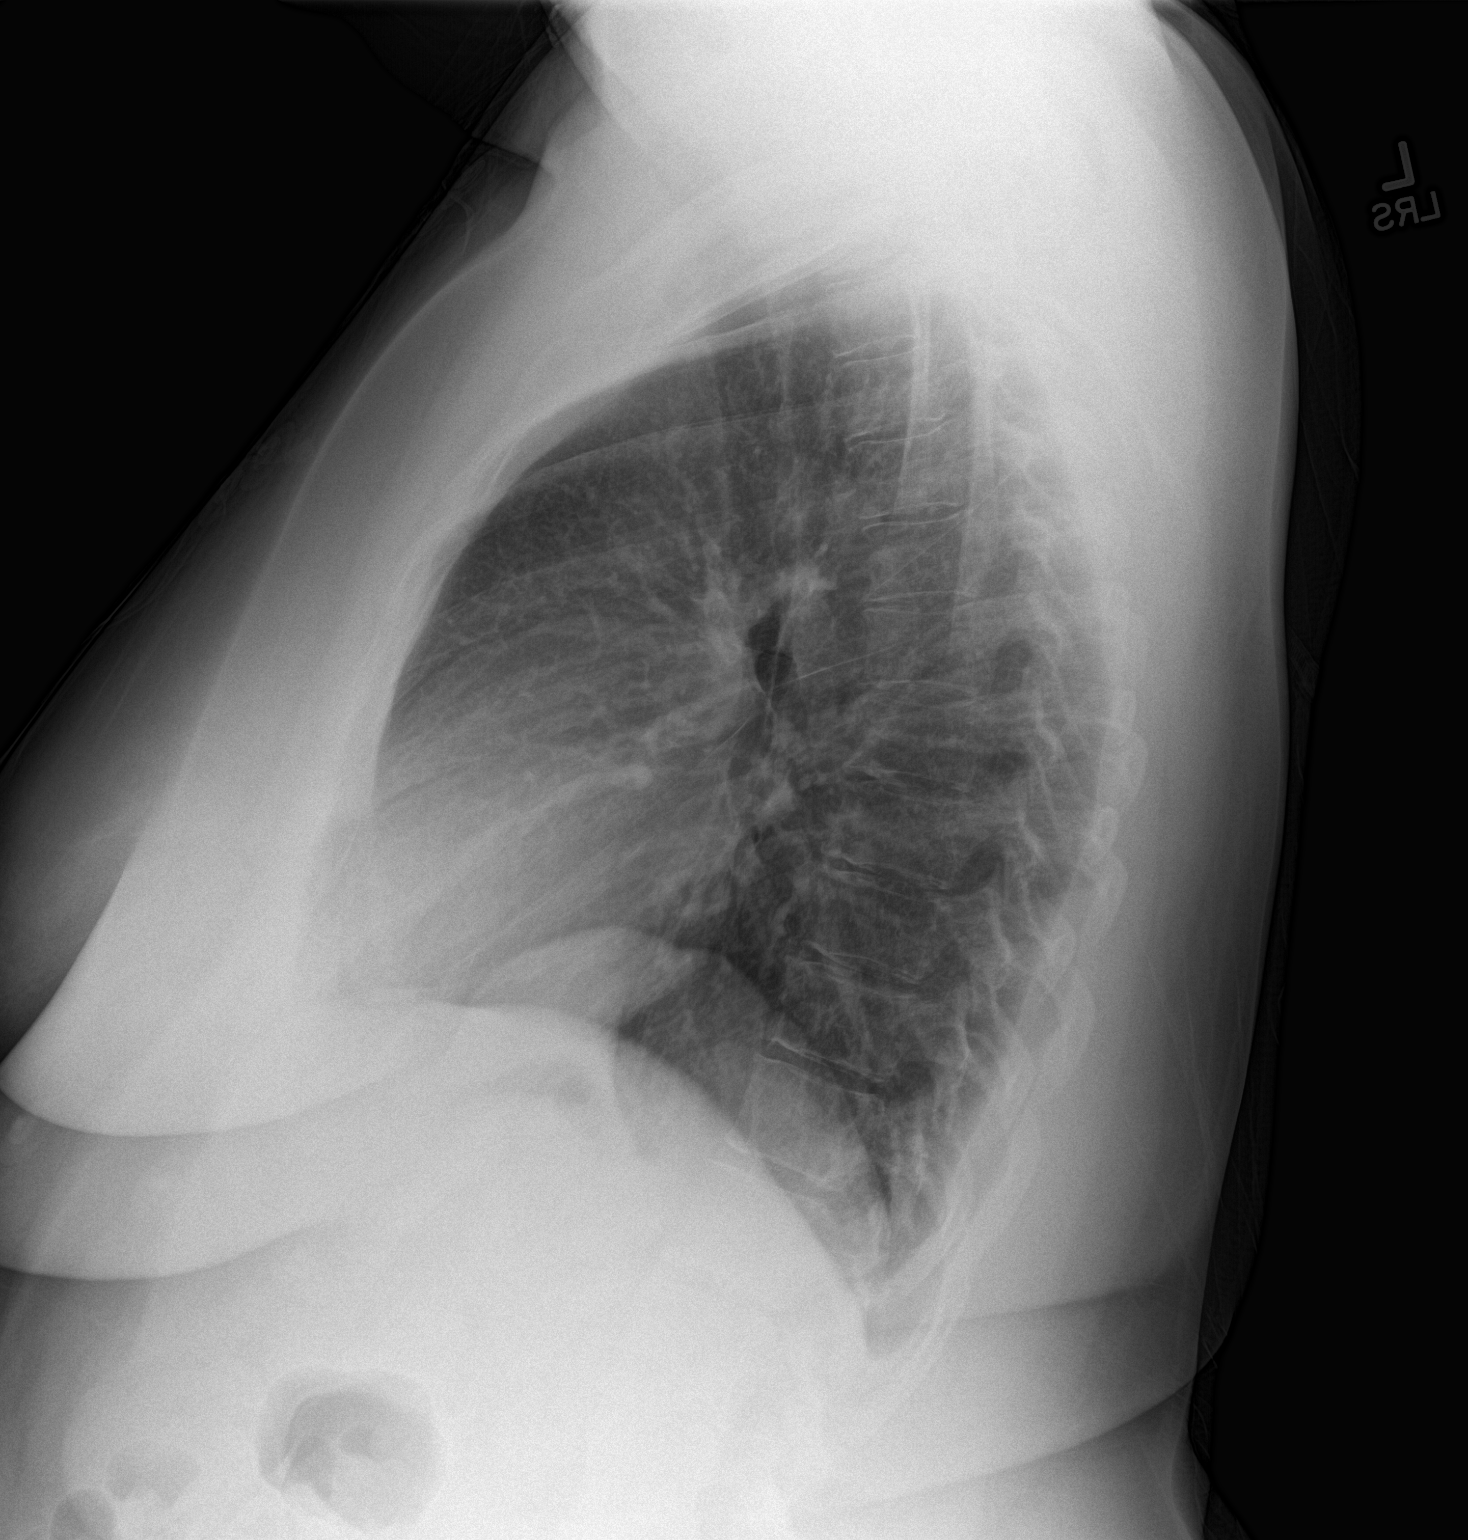

[2 of 2 positions shown; findings below may reference images not displayed]

FINDINGS: The cardiomediastinal silhouette is unremarkable.

There is no evidence of focal airspace disease, pulmonary edema,
suspicious pulmonary nodule/mass, pleural effusion, or pneumothorax.
No acute bony abnormalities are identified.
IMPRESSION: No active cardiopulmonary disease.

## 2015-07-26 ENCOUNTER — Emergency Department (HOSPITAL_COMMUNITY): Payer: No Typology Code available for payment source

## 2015-07-26 ENCOUNTER — Emergency Department (HOSPITAL_COMMUNITY)
Admission: EM | Admit: 2015-07-26 | Discharge: 2015-07-26 | Disposition: A | Discharge status: Home / Self Care | Payer: No Typology Code available for payment source | Attending: Emergency Medicine | Admitting: Emergency Medicine

## 2015-07-26 ENCOUNTER — Encounter (HOSPITAL_COMMUNITY): Payer: Self-pay

## 2015-07-26 DIAGNOSIS — Z862 Personal history of diseases of the blood and blood-forming organs and certain disorders involving the immune mechanism: Secondary | ICD-10-CM | POA: Insufficient documentation

## 2015-07-26 DIAGNOSIS — Z86018 Personal history of other benign neoplasm: Secondary | ICD-10-CM | POA: Insufficient documentation

## 2015-07-26 DIAGNOSIS — Z9851 Tubal ligation status: Secondary | ICD-10-CM | POA: Diagnosis not present

## 2015-07-26 DIAGNOSIS — Z79899 Other long term (current) drug therapy: Secondary | ICD-10-CM | POA: Insufficient documentation

## 2015-07-26 DIAGNOSIS — G40909 Epilepsy, unspecified, not intractable, without status epilepticus: Secondary | ICD-10-CM | POA: Diagnosis not present

## 2015-07-26 DIAGNOSIS — Z87891 Personal history of nicotine dependence: Secondary | ICD-10-CM | POA: Diagnosis not present

## 2015-07-26 DIAGNOSIS — K219 Gastro-esophageal reflux disease without esophagitis: Secondary | ICD-10-CM | POA: Diagnosis not present

## 2015-07-26 DIAGNOSIS — R079 Chest pain, unspecified: Secondary | ICD-10-CM

## 2015-07-26 DIAGNOSIS — Z880 Allergy status to penicillin: Secondary | ICD-10-CM | POA: Diagnosis not present

## 2015-07-26 DIAGNOSIS — J45909 Unspecified asthma, uncomplicated: Secondary | ICD-10-CM | POA: Diagnosis not present

## 2015-07-26 LAB — I-STAT TROPONIN, ED: Troponin i, poc: 0 ng/mL (ref 0.00–0.08)

## 2015-07-26 LAB — CBC
HEMATOCRIT: 36 % (ref 36.0–46.0)
Hemoglobin: 12.2 g/dL (ref 12.0–15.0)
MCH: 29.6 pg (ref 26.0–34.0)
MCHC: 33.9 g/dL (ref 30.0–36.0)
MCV: 87.4 fL (ref 78.0–100.0)
PLATELETS: 238 10*3/uL (ref 150–400)
RBC: 4.12 MIL/uL (ref 3.87–5.11)
RDW: 13 % (ref 11.5–15.5)
WBC: 4.8 10*3/uL (ref 4.0–10.5)

## 2015-07-26 LAB — BASIC METABOLIC PANEL
Anion gap: 9 (ref 5–15)
BUN: 9 mg/dL (ref 6–20)
CHLORIDE: 112 mmol/L — AB (ref 101–111)
CO2: 18 mmol/L — AB (ref 22–32)
CREATININE: 0.85 mg/dL (ref 0.44–1.00)
Calcium: 8.9 mg/dL (ref 8.9–10.3)
GFR calc Af Amer: >60 mL/min (ref >60)
GFR calc non Af Amer: >60 mL/min (ref >60)
Glucose, Bld: 137 mg/dL — ABNORMAL HIGH (ref 65–99)
Potassium: 3.5 mmol/L (ref 3.5–5.1)
Sodium: 139 mmol/L (ref 135–145)

## 2015-07-26 MED ORDER — OMEPRAZOLE 20 MG PO CPDR: 20.0000 mg | DELAYED_RELEASE_CAPSULE | Freq: Every day | ORAL | Status: DC

## 2015-07-26 MED ORDER — GI COCKTAIL ~~LOC~~
30.0000 mL | Freq: Once | ORAL | Status: AC
  Administered 2015-07-26: 30 mL via ORAL
  Filled 2015-07-26: qty 30

## 2015-07-26 NOTE — Discharge Instructions (Signed)
Chest Pain (Nonspecific) °It is often hard to give a specific diagnosis for the cause of chest pain. There is always a chance that your pain could be related to something serious, such as a heart attack or a blood clot in the lungs. You need to follow up with your health care provider for further evaluation. °CAUSES  °· Heartburn. °· Pneumonia or bronchitis. °· Anxiety or stress. °· Inflammation around your heart (pericarditis) or lung (pleuritis or pleurisy). °· A blood clot in the lung. °· A collapsed lung (pneumothorax). It can develop suddenly on its own (spontaneous pneumothorax) or from trauma to the chest. °· Shingles infection (herpes zoster virus). °The chest wall is composed of bones, muscles, and cartilage. Any of these can be the source of the pain. °· The bones can be bruised by injury. °· The muscles or cartilage can be strained by coughing or overwork. °· The cartilage can be affected by inflammation and become sore (costochondritis). °DIAGNOSIS  °Lab tests or other studies may be needed to find the cause of your pain. Your health care provider may have you take a test called an ambulatory electrocardiogram (ECG). An ECG records your heartbeat patterns over a 24-hour period. You may also have other tests, such as: °· Transthoracic echocardiogram (TTE). During echocardiography, sound waves are used to evaluate how blood flows through your heart. °· Transesophageal echocardiogram (TEE). °· Cardiac monitoring. This allows your health care provider to monitor your heart rate and rhythm in real time. °· Holter monitor. This is a portable device that records your heartbeat and can help diagnose heart arrhythmias. It allows your health care provider to track your heart activity for several days, if needed. °· Stress tests by exercise or by giving medicine that makes the heart beat faster. °TREATMENT  °· Treatment depends on what may be causing your chest pain. Treatment may include: °· Acid blockers for  heartburn. °· Anti-inflammatory medicine. °· Pain medicine for inflammatory conditions. °· Antibiotics if an infection is present. °· You may be advised to change lifestyle habits. This includes stopping smoking and avoiding alcohol, caffeine, and chocolate. °· You may be advised to keep your head raised (elevated) when sleeping. This reduces the chance of acid going backward from your stomach into your esophagus. °Most of the time, nonspecific chest pain will improve within 2-3 days with rest and mild pain medicine.  °HOME CARE INSTRUCTIONS  °· If antibiotics were prescribed, take them as directed. Finish them even if you start to feel better. °· For the next few days, avoid physical activities that bring on chest pain. Continue physical activities as directed. °· Do not use any tobacco products, including cigarettes, chewing tobacco, or electronic cigarettes. °· Avoid drinking alcohol. °· Only take medicine as directed by your health care provider. °· Follow your health care provider's suggestions for further testing if your chest pain does not go away. °· Keep any follow-up appointments you made. If you do not go to an appointment, you could develop lasting (chronic) problems with pain. If there is any problem keeping an appointment, call to reschedule. °SEEK MEDICAL CARE IF:  °· Your chest pain does not go away, even after treatment. °· You have a rash with blisters on your chest. °· You have a fever. °SEEK IMMEDIATE MEDICAL CARE IF:  °· You have increased chest pain or pain that spreads to your arm, neck, jaw, back, or abdomen. °· You have shortness of breath. °· You have an increasing cough, or you cough   up blood. °· You have severe back or abdominal pain. °· You feel nauseous or vomit. °· You have severe weakness. °· You faint. °· You have chills. °This is an emergency. Do not wait to see if the pain will go away. Get medical help at once. Call your local emergency services (911 in [place] Do not drive  yourself to the hospital. °MAKE SURE YOU:  °· Understand these instructions. °· Will watch your condition. °· Will get help right away if you are not doing well or get worse. °Document Released: 09/06/2005 Document Revised: 12/02/2013 Document Reviewed: 07/02/2008 °[employer]® Patient Information ©2015 [employer]. This information is not intended to replace advice given to you by your health care provider. Make sure you discuss any questions you have with your health care provider. °Gastroesophageal Reflux Disease, Adult °Gastroesophageal reflux disease (GERD) happens when acid from your stomach flows up into the esophagus. When acid comes in contact with the esophagus, the acid causes soreness (inflammation) in the esophagus. Over time, GERD may create small holes (ulcers) in the lining of the esophagus. °CAUSES  °· Increased body weight. This puts pressure on the stomach, making acid rise from the stomach into the esophagus. °· Smoking. This increases acid production in the stomach. °· Drinking alcohol. This causes decreased pressure in the lower esophageal sphincter (valve or ring of muscle between the esophagus and stomach), allowing acid from the stomach into the esophagus. °· Late evening meals and a full stomach. This increases pressure and acid production in the stomach. °· A malformed lower esophageal sphincter. °Sometimes, no cause is found. °SYMPTOMS  °· Burning pain in the lower part of the mid-chest behind the breastbone and in the mid-stomach area. This may occur twice a week or more often. °· Trouble swallowing. °· Sore throat. °· Dry cough. °· Asthma-like symptoms including chest tightness, shortness of breath, or wheezing. °DIAGNOSIS  °Your caregiver may be able to diagnose GERD based on your symptoms. In some cases, X-rays and other tests may be done to check for complications or to check the condition of your stomach and esophagus. °TREATMENT  °Your caregiver may recommend over-the-counter or  prescription medicines to help decrease acid production. Ask your caregiver before starting or adding any new medicines.  °HOME CARE INSTRUCTIONS  °· Change the factors that you can control. Ask your caregiver for guidance concerning weight loss, quitting smoking, and alcohol consumption. °· Avoid foods and drinks that make your symptoms worse, such as: °¨ Caffeine or alcoholic drinks. °¨ Chocolate. °¨ Peppermint or mint flavorings. °¨ Garlic and onions. °¨ Spicy foods. °¨ Citrus fruits, such as oranges, lemons, or limes. °¨ Tomato-based foods such as sauce, chili, salsa, and pizza. °¨ Fried and fatty foods. °· Avoid lying down for the 3 hours prior to your bedtime or prior to taking a nap. °· Eat small, frequent meals instead of large meals. °· Wear loose-fitting clothing. Do not wear anything tight around your waist that causes pressure on your stomach. °· Raise the head of your bed 6 to 8 inches with wood blocks to help you sleep. Extra pillows will not help. °· Only take over-the-counter or prescription medicines for pain, discomfort, or fever as directed by your caregiver. °· Do not take aspirin, ibuprofen, or other nonsteroidal anti-inflammatory drugs (NSAIDs). °SEEK IMMEDIATE MEDICAL CARE IF:  °· You have pain in your arms, neck, jaw, teeth, or back. °· Your pain increases or changes in intensity or duration. °· You develop nausea, vomiting, or sweating (diaphoresis). °·   You develop shortness of breath, or you faint. °· Your vomit is green, yellow, black, or looks like coffee grounds or blood. °· Your stool is red, bloody, or black. °These symptoms could be signs of other problems, such as heart disease, gastric bleeding, or esophageal bleeding. °MAKE SURE YOU:  °· Understand these instructions. °· Will watch your condition. °· Will get help right away if you are not doing well or get worse. °Document Released: 09/06/2005 Document Revised: 02/19/2012 Document Reviewed: 06/16/2011 °[employer]® Patient  Information ©2015 [employer]. This information is not intended to replace advice given to you by your health care provider. Make sure you discuss any questions you have with your health care provider. ° °

## 2015-07-26 NOTE — ED Notes (Addendum)
Patient presents with chest pain and states that it feels like a dull sharp pain. Patient states the pain is on the right side of her chest.

## 2015-07-26 NOTE — ED Provider Notes (Signed)
CSN: [id number]     Arrival date & time 07/26/15  0768 History   First MD Initiated Contact with Patient 07/26/15 [id number]     Chief Complaint  Patient presents with  . Chest Pain    Nicole [name] is a 39 y.o. female with a history of anemia, seizures, and asthma who presents to the ED complaining of substernal, right and left sided chest pain since yesterday around 1330. She reports bending over to pick up trash and developing chest pain that has been constant since. She reports her pain becomes sharp when she lays down and has dull pain with certain movements. She reports worse pain with walking as well. She currently complains of 7 out of 10 sharp and dull pain. Patient denies any shortness of breath, cough or wheezing. Patient also reports a hoarse voice for the past 4 months which she attributes to her allergies. The patient reports a family history with her mother having an MI at age 29 and her father having an MI at age 13. She denies personal history of MI. She denies personal or close family history of PEs or DVTs. She denies any recent long travel or endogenous estrogen use. The patient denies fevers, chills, recent illness, shortness of breath, cough, wheezing, abdominal pain, nausea, vomiting, palpitations, leg swelling, numbness, weakness, or rashes. The patient denies any heavy lifting or new exercise.  (Consider location/radiation/quality/duration/timing/severity/associated sxs/prior Treatment) HPI  Past Medical History  Diagnosis Date  . Anemia   . Asthma   . Blood transfusion 2011    r/t anemia  . Migraine   . Sickle cell trait   . Seizures     last sz Jan 2012  . Seizures   . Fibroid   . Tooth abscess   . Sickle cell trait   . Migraine    Past Surgical History  Procedure Laterality Date  . Tubal ligation Bilateral 2005   Family History  Problem Relation Age of Onset  . Anesthesia problems Neg Hx   . Hypertension Mother   . Diabetes Mother   . Cancer Mother    Colon, brain, 2 other  . Asthma Mother   . Clotting disorder Mother   . Heart disease Mother     23, stents  . Stroke Mother     2012  . Diabetes Father   . Asthma Father   . Clotting disorder Father   . Sickle cell anemia Father   . Heart disease Father     2000, stents  . Hypertension Father   . Stroke Father     2007  . Diabetes Maternal Grandmother   . Asthma Sister   . Asthma Brother   . Asthma Paternal Grandmother    Social History  Substance Use Topics  . Smoking status: Former Smoker    Quit date: 03/27/2014  . Smokeless tobacco: Never Used  . Alcohol Use: No   OB History    Gravida Para Term Preterm AB TAB SAB Ectopic Multiple Living   3 2 1 1 1  0 0 1 0 2     Review of Systems  Constitutional: Negative for fever, chills and fatigue.  HENT: Negative for congestion, postnasal drip, sore throat and trouble swallowing.   Eyes: Negative for visual disturbance.  Respiratory: Negative for cough, shortness of breath and wheezing.   Cardiovascular: Positive for chest pain. Negative for palpitations and leg swelling.  Gastrointestinal: Negative for nausea, vomiting, abdominal pain and diarrhea.  Genitourinary: Negative for dysuria.  Musculoskeletal: Negative for back pain and neck pain.  Skin: Negative for rash.  Neurological: Negative for dizziness, syncope, weakness, light-headedness, numbness and headaches.      Allergies  Shrimp; Tea; Penicillins; and Sulfa antibiotics  Home Medications   Prior to Admission medications   Medication Sig Start Date End Date Taking? Authorizing Provider  albuterol (PROVENTIL HFA;VENTOLIN HFA) 108 (90 BASE) MCG/ACT inhaler Inhale 2 puffs into the lungs every 4 (four) hours as needed for wheezing or shortness of breath (or coughing). 03/11/15  Yes Shelly Bombard, MD  clonazePAM (KLONOPIN) 1 MG tablet Take 1 mg by mouth 2 (two) times daily.   Yes Historical Provider, MD  fexofenadine (ALLEGRA) 180 MG tablet Take 1 tablet (180  mg total) by mouth daily. 08/13/14  Yes Leone Brand, MD  gabapentin (NEURONTIN) 300 MG capsule Take 1 capsule (300 mg total) by mouth 3 (three) times daily. Patient taking differently: Take 300-1,200 mg by mouth 3 (three) times daily. Takes one capsule twice daily and then takes four capsules at bedtime. 01/25/15  Yes Tanna Furry, MD  levETIRAcetam (KEPPRA) 1000 MG tablet Take 1,000-2,000 mg by mouth 3 (three) times daily. Takes one tablet twice daily and two tablets at bedtime.   Yes Historical Provider, MD  loratadine (CLARITIN) 10 MG tablet Take 1 tablet (10 mg total) by mouth daily. 03/11/15  Yes Shelly Bombard, MD  medroxyPROGESTERone (DEPO-PROVERA) 150 MG/ML injection Inject 1 mL (150 mg total) into the muscle every 3 (three) months. Inject on  the 22nd of each month 03/11/15  Yes Shelly Bombard, MD  naproxen sodium (ANAPROX DS) 550 MG tablet Take 1 tablet (550 mg total) by mouth 2 (two) times daily with a meal. Patient taking differently: Take 550 mg by mouth 2 (two) times daily.  03/11/15  Yes Shelly Bombard, MD  Prenatal Vit-Fe Fumarate-FA (PNV PRENATAL PLUS MULTIVITAMIN) 27-1 MG TABS Take 1 tablet by mouth daily. 07/22/14  Yes Historical Provider, MD  topiramate (TOPAMAX) 25 MG tablet Take 25 mg by mouth 2 (two) times daily.   Yes Historical Provider, MD  diazepam (VALIUM) 5 MG tablet Take 1 tablet (5 mg total) by mouth every 8 (eight) hours as needed (vertigo). Patient not taking: Reported on 03/11/2015 01/24/15   Tanna Furry, MD  docusate sodium 100 MG CAPS Take 100 mg by mouth daily. Patient not taking: Reported on 06/21/2015 11/20/14   Ashly M Gottschalk, DO  levETIRAcetam (KEPPRA) 750 MG tablet TAKE 1 TABLET (750 MG TOTAL) BY MOUTH 2 (TWO) TIMES DAILY. Patient not taking: Reported on 03/08/2015 12/14/14   Leone Brand, MD  omeprazole (PRILOSEC) 20 MG capsule Take 1 capsule (20 mg total) by mouth daily. 07/26/15   Waynetta Pean, PA-C   BP 112/80 mmHg  Pulse 58  Temp(Src) 98.7 F (37.1  C) (Oral)  Resp 16  Ht 5\' 5"  (1.651 m)  Wt 278 lb (126.1 kg)  BMI 46.26 kg/m2  SpO2 100% Physical Exam  Constitutional: She is oriented to person, place, and time. She appears well-developed and well-nourished. No distress.  Nontoxic appearing.  HENT:  Head: Normocephalic and atraumatic.  Mouth/Throat: Oropharynx is clear and moist. No oropharyngeal exudate.  Eyes: Conjunctivae are normal. Pupils are equal, round, and reactive to light. Right eye exhibits no discharge. Left eye exhibits no discharge.  Neck: Normal range of motion. Neck supple. No JVD present. No tracheal deviation present.  Cardiovascular: Normal rate, regular rhythm, normal heart sounds and intact distal pulses.  Exam  reveals no gallop and no friction rub.   No murmur heard. Bilateral radial, posterior tibialis and dorsalis pedis pulses are intact.    Pulmonary/Chest: Effort normal and breath sounds normal. No respiratory distress. She has no wheezes. She has no rales. She exhibits tenderness.  Lungs are clear to auscultation bilaterally. Patient has anterior chest wall tenderness that does not reproduce her chest pain completely.  Abdominal: Soft. She exhibits no distension. There is no tenderness. There is no guarding.  Abdomen is soft and nontender to palpation.  Musculoskeletal: She exhibits no edema or tenderness.  No lower extremity edema. No calf tenderness.  Lymphadenopathy:    She has no cervical adenopathy.  Neurological: She is alert and oriented to person, place, and time. Coordination normal.  Skin: Skin is warm and dry. No rash noted. She is not diaphoretic. No erythema. No pallor.  Psychiatric: She has a normal mood and affect. Her behavior is normal.  Nursing note and vitals reviewed.   ED Course  Procedures (including critical care time) Labs Review Labs Reviewed  BASIC METABOLIC PANEL - Abnormal; Notable for the following:    Chloride 112 (*)    CO2 18 (*)    Glucose, Bld 137 (*)    All  other components within normal limits  CBC  I-STAT TROPOININ, ED    Imaging Review Dg Chest 2 View  07/26/2015   CLINICAL DATA:  Chest pain  EXAM: CHEST  2 VIEW  COMPARISON:  11/09/2014  FINDINGS: Normal heart size and mediastinal contours. No acute infiltrate or edema. No effusion or pneumothorax. No acute osseous findings.  IMPRESSION: No active cardiopulmonary disease.   Electronically Signed   By: Monte Fantasia M.D.   On: 07/26/2015 06:23   I, Hanley Hays, personally reviewed and evaluated these images and lab results as part of my medical decision-making.   EKG Interpretation   Date/Time:  Monday July 26 2015 05:27:25 EDT Ventricular Rate:  74 PR Interval:  174 QRS Duration: 85 QT Interval:  379 QTC Calculation: 420 R Axis:   53 Text Interpretation:  Sinus rhythm Baseline wander Otherwise within normal  limits No significant change since last tracing 18 Nov 2014 Confirmed by  KNAPP  MD-I, IVA ([id number]) on 07/26/2015 5:39:55 AM      Filed Vitals:   07/26/15 0521 07/26/15 0628 07/26/15 0800 07/26/15 0933  BP: 111/86 102/65 109/78 112/80  Pulse: 73 76 63 58  Temp: 97.5 F (36.4 C)  98.7 F (37.1 C)   TempSrc: Oral  Oral   Resp:  24 11 16   Height:  5\' 5"  (1.651 m)    Weight:  278 lb (126.1 kg)    SpO2: 99% 99% 100%      MDM   Meds given in ED:  Medications  gi cocktail (Maalox,Lidocaine,Donnatal) (30 mLs Oral Given 07/26/15 0810)    New Prescriptions   OMEPRAZOLE (PRILOSEC) 20 MG CAPSULE    Take 1 capsule (20 mg total) by mouth daily.    Final diagnoses:  Chest pain, unspecified chest pain type  Gastroesophageal reflux disease, esophagitis presence not specified   Patient presented with chest pain to the ED. Patient is to be discharged with recommendation to follow up with PCP in regards to today's hospital visit. Chest pain is not likely of cardiac or pulmonary etiology due to presentation, perc negative, VSS, no tracheal deviation, no JVD or new  murmur, RRR, breath sounds equal bilaterally, EKG without acute abnormalities, negative troponin, and negative CXR. HEART score  is 2. PERC negative. Patient has been advised to return to the ED if chest pain becomes exertional, associated with diaphoresis or nausea, radiates to left jaw/arm, worsens or becomes concerning in any way. Patient reports improvement of her chest pain after GI cocktail. Will start patient on omeprazole. Patient appears reliable for follow up and is agreeable to discharge. I advised the patient to follow-up with their primary care provider this week. I advised the patient to return to the emergency department with new or worsening symptoms or new concerns. The patient verbalized understanding and agreement with plan.    This patient was discussed with Dr. Betsey Holiday who agrees with assessment and plan.      Waynetta Pean, PA-C 07/26/15 [place], MD 07/26/15 [phone]

## 2015-08-24 ENCOUNTER — Telehealth: Payer: Self-pay | Admitting: Family Medicine

## 2015-08-24 NOTE — Telephone Encounter (Signed)
Family Medicine Emergency Line Telephone Note  Patient reports aching, sharp pain in chest that has been constant for last 2-3 hours.  She thinks it is chest wall pain (she has had that before and it felt similar). Pain came on at rest and not associated with SOB or nausea.  She has not been doing any extra activity or lifting recently.  Repositioning does not change pain.  Advised patient that this is unlikely cardiac in origin.  She can try tylenol and/or naproxen for pain and be evaluated at [facility] in AM.  If Naproxen does not help, ED would have to evaluate. Red flags reviewed.  Virginia Crews, MD, MPH PGY-2,  [facility] 08/24/2015 11:31 PM

## 2015-09-22 IMAGING — CT CT HEAD W/O CM
2 series · 16 of 30 positions shown, 19 images · non-contrast
Comparison: CT of the head September 18, 2014

CLINICAL DATA: Headache today, fell 1 hour ago with seizure like
activity witnessed by family. History of seizures.

EXAM:
CT HEAD WITHOUT CONTRAST
TECHNIQUE: Contiguous axial images were obtained from the base of the skull
through the vertex without intravenous contrast.

[Series 2: head w/o · axial · non-contrast · 0.45mm/px · z∈[+1289,+1409]mm · 9 of 30 slices shown, 12 images]
[im 3/30  brain]
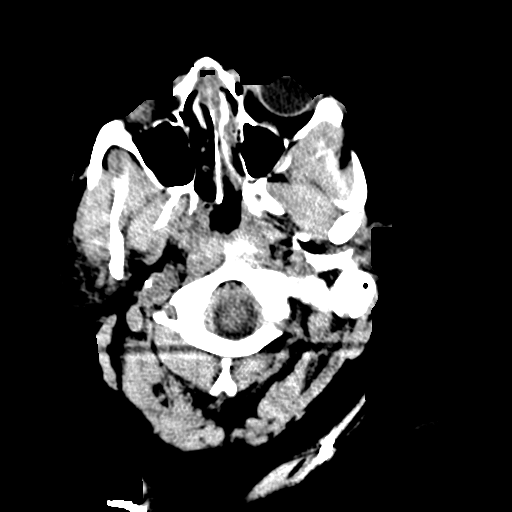
[im 3/30  bone]
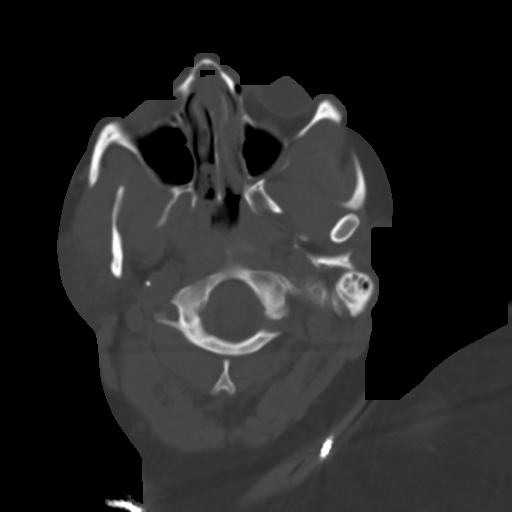
[im 6/30  brain]
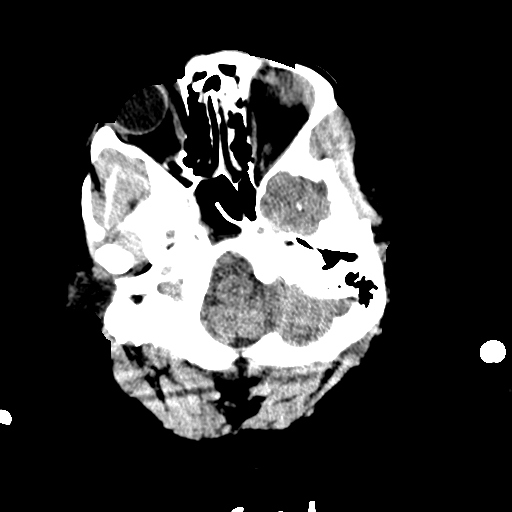
[im 9/30  brain]
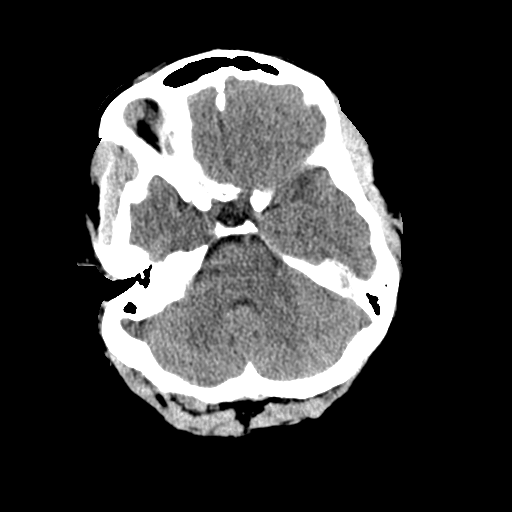
[im 12/30  brain]
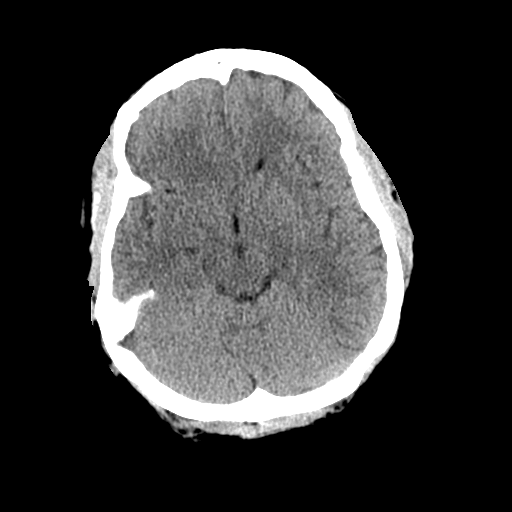
[im 15/30  brain]
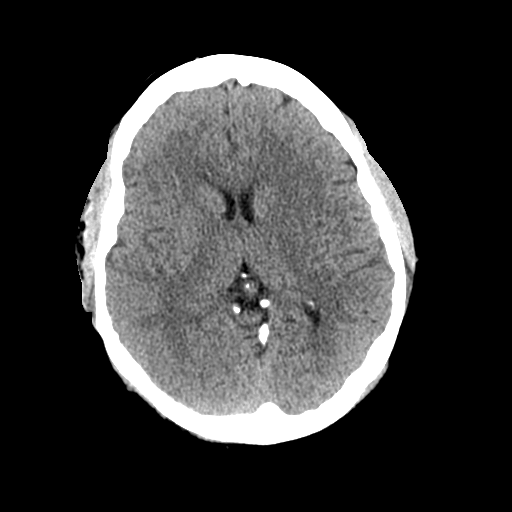
[im 15/30  bone]
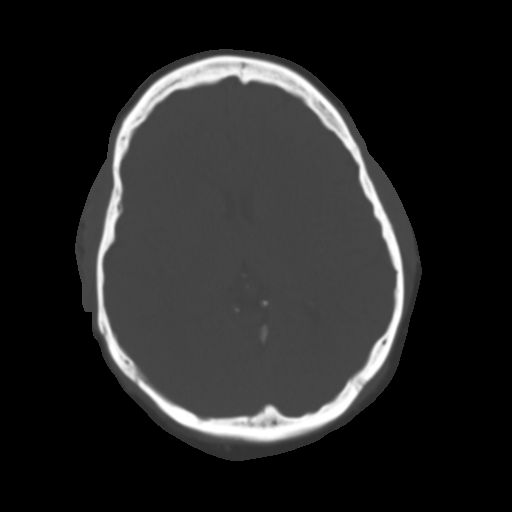
[im 18/30  brain]
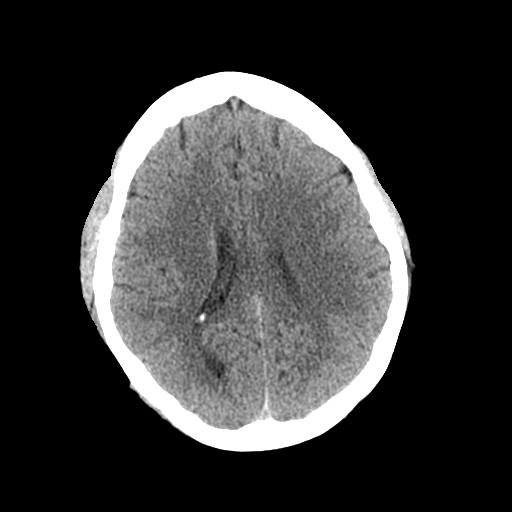
[im 21/30  brain]
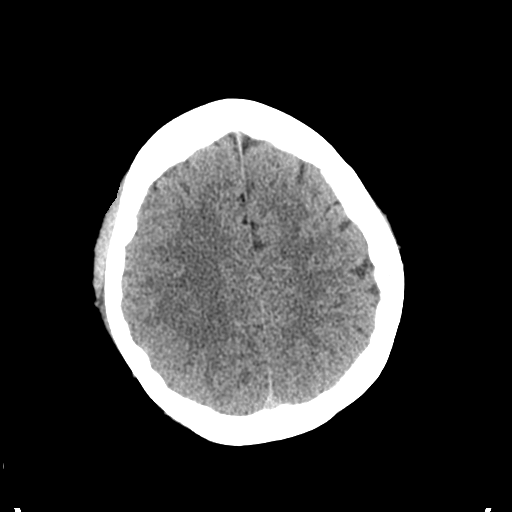
[im 24/30  brain]
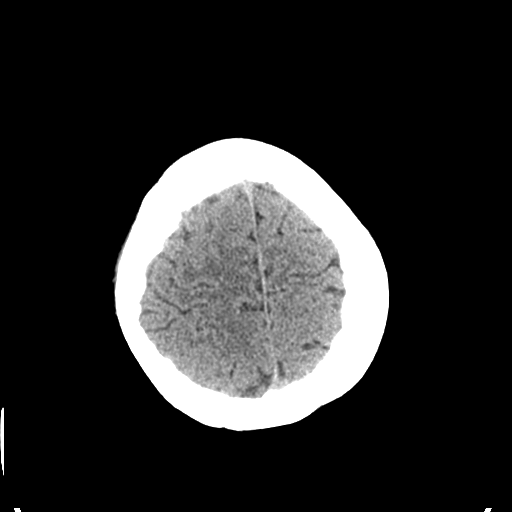
[im 27/30  brain]
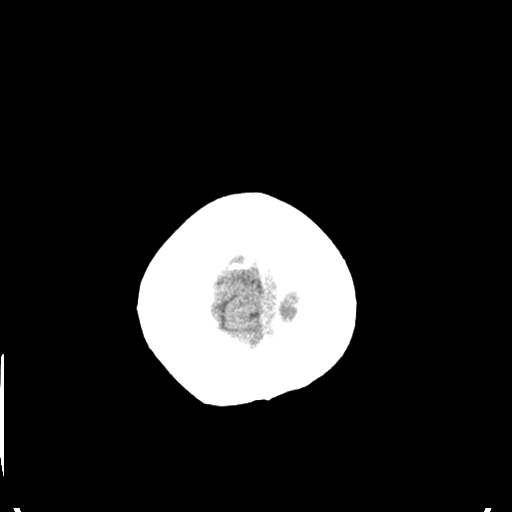
[im 27/30  bone]
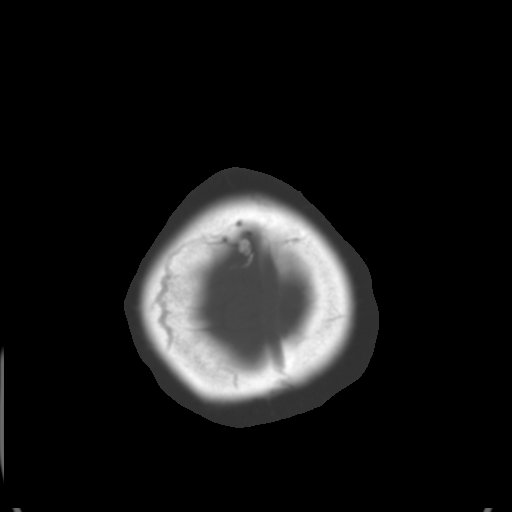

[Series 3: bone windows · axial · 0.45mm/px · z∈[+1294,+1390]mm · 7 of 49 slices shown]
[im 6/49  bone]
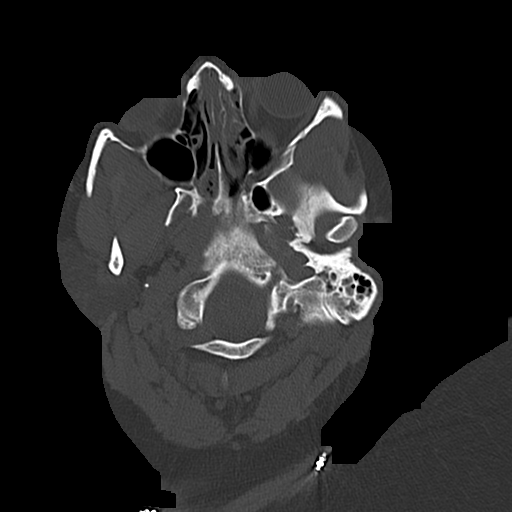
[im 11/49  bone]
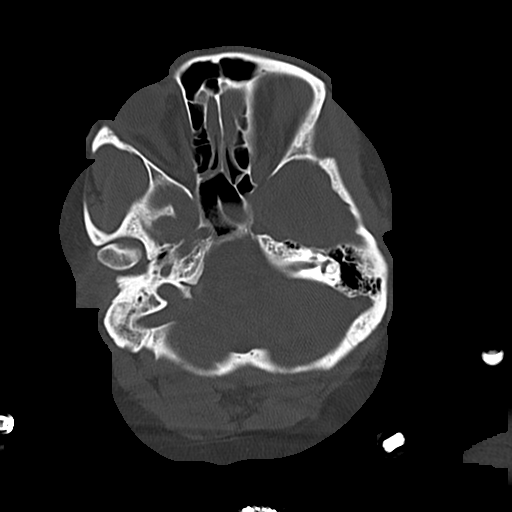
[im 17/49  bone]
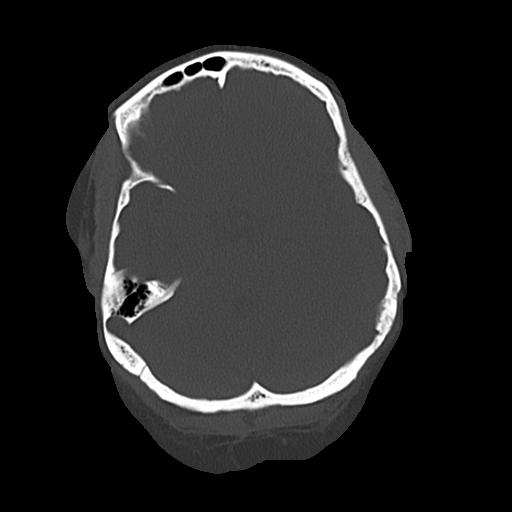
[im 22/49  bone]
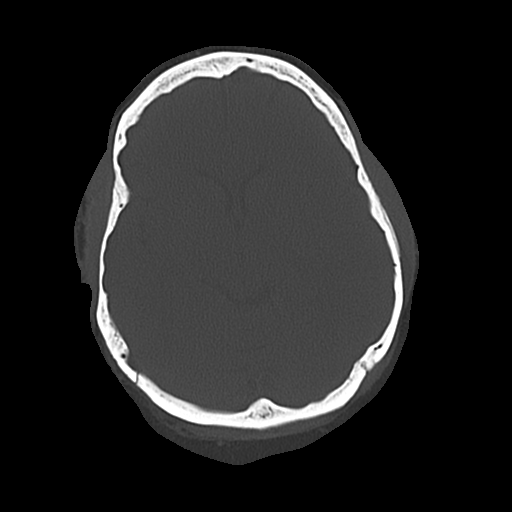
[im 27/49  bone]
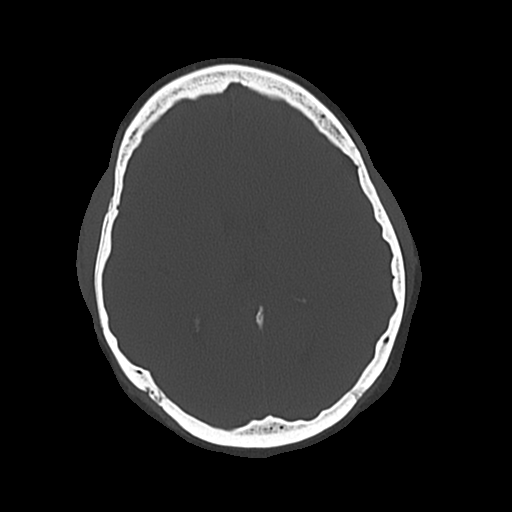
[im 33/49  bone]
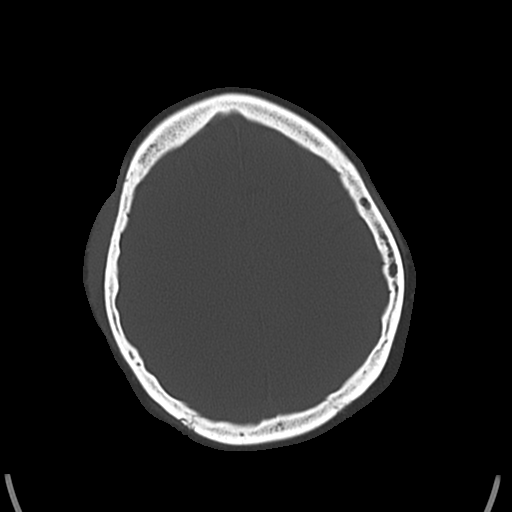
[im 38/49  bone]
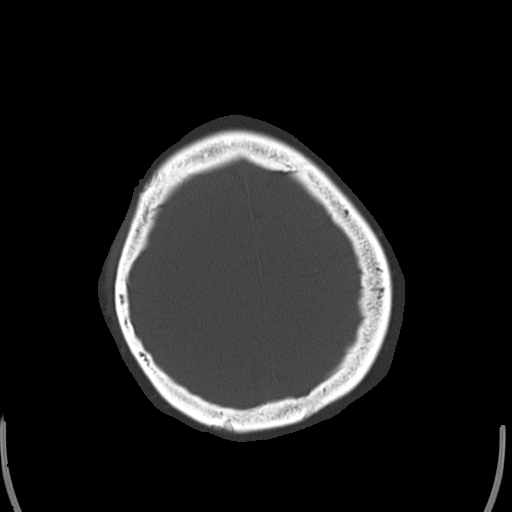

[16 of 30 positions shown; findings below may reference images not displayed]

FINDINGS: The ventricles and sulci are normal for age. No intraparenchymal
hemorrhage, mass effect nor midline shift. Patchy supratentorial
white matter hypodensities are within normal range for patient's age
and though non-specific suggest sequelae of chronic small vessel
ischemic disease. No acute large vascular territory infarcts. Fluid
filled mildly expanded sella.

No abnormal extra-axial fluid collections. Basal cisterns are
patent. Moderate calcific atherosclerosis of the carotid siphons.

No skull fracture. The included ocular globes and orbital contents
are non-suspicious. Circumferential RIGHT sphenoid sinus mucosal
thickening with frothy secretions. Ethmoid and RIGHT maxillary sinus
mucosal thickening. The mastoid air cells are well aerated.
IMPRESSION: No acute intracranial process.

Probable empty sella, incompletely characterized.

Acute on chronic moderate paranasal sinusitis.

  By: Frost Durocher

## 2015-09-26 ENCOUNTER — Emergency Department (HOSPITAL_COMMUNITY)
Admission: EM | Admit: 2015-09-26 | Discharge: 2015-09-27 | Disposition: A | Discharge status: Home / Self Care | Payer: Self-pay | Attending: Emergency Medicine | Admitting: Emergency Medicine

## 2015-09-26 ENCOUNTER — Emergency Department (HOSPITAL_COMMUNITY): Payer: Self-pay

## 2015-09-26 ENCOUNTER — Emergency Department (HOSPITAL_COMMUNITY): Payer: No Typology Code available for payment source

## 2015-09-26 ENCOUNTER — Encounter (HOSPITAL_COMMUNITY): Payer: Self-pay | Admitting: Emergency Medicine

## 2015-09-26 DIAGNOSIS — R102 Pelvic and perineal pain unspecified side: Secondary | ICD-10-CM

## 2015-09-26 DIAGNOSIS — Z3202 Encounter for pregnancy test, result negative: Secondary | ICD-10-CM | POA: Insufficient documentation

## 2015-09-26 DIAGNOSIS — Z9851 Tubal ligation status: Secondary | ICD-10-CM | POA: Insufficient documentation

## 2015-09-26 DIAGNOSIS — Z79899 Other long term (current) drug therapy: Secondary | ICD-10-CM | POA: Insufficient documentation

## 2015-09-26 DIAGNOSIS — Z8719 Personal history of other diseases of the digestive system: Secondary | ICD-10-CM | POA: Insufficient documentation

## 2015-09-26 DIAGNOSIS — G43909 Migraine, unspecified, not intractable, without status migrainosus: Secondary | ICD-10-CM | POA: Insufficient documentation

## 2015-09-26 DIAGNOSIS — Z880 Allergy status to penicillin: Secondary | ICD-10-CM | POA: Insufficient documentation

## 2015-09-26 DIAGNOSIS — G40909 Epilepsy, unspecified, not intractable, without status epilepticus: Secondary | ICD-10-CM | POA: Insufficient documentation

## 2015-09-26 DIAGNOSIS — Z791 Long term (current) use of non-steroidal anti-inflammatories (NSAID): Secondary | ICD-10-CM | POA: Insufficient documentation

## 2015-09-26 DIAGNOSIS — D649 Anemia, unspecified: Secondary | ICD-10-CM | POA: Insufficient documentation

## 2015-09-26 DIAGNOSIS — N739 Female pelvic inflammatory disease, unspecified: Secondary | ICD-10-CM

## 2015-09-26 DIAGNOSIS — J45909 Unspecified asthma, uncomplicated: Secondary | ICD-10-CM | POA: Insufficient documentation

## 2015-09-26 DIAGNOSIS — Z87891 Personal history of nicotine dependence: Secondary | ICD-10-CM | POA: Insufficient documentation

## 2015-09-26 DIAGNOSIS — Z86018 Personal history of other benign neoplasm: Secondary | ICD-10-CM | POA: Insufficient documentation

## 2015-09-26 LAB — COMPREHENSIVE METABOLIC PANEL
ALBUMIN: 4.1 g/dL (ref 3.5–5.0)
ALT: 13 U/L — ABNORMAL LOW (ref 14–54)
ANION GAP: 7 (ref 5–15)
AST: 23 U/L (ref 15–41)
Alkaline Phosphatase: 79 U/L (ref 38–126)
BUN: 9 mg/dL (ref 6–20)
CHLORIDE: 111 mmol/L (ref 101–111)
CO2: 19 mmol/L — AB (ref 22–32)
Calcium: 9 mg/dL (ref 8.9–10.3)
Creatinine, Ser: 0.89 mg/dL (ref 0.44–1.00)
GFR calc Af Amer: >60 mL/min (ref >60)
GFR calc non Af Amer: >60 mL/min (ref >60)
GLUCOSE: 119 mg/dL — AB (ref 65–99)
POTASSIUM: 3.6 mmol/L (ref 3.5–5.1)
SODIUM: 137 mmol/L (ref 135–145)
TOTAL PROTEIN: 7.3 g/dL (ref 6.5–8.1)
Total Bilirubin: 0.6 mg/dL (ref 0.3–1.2)

## 2015-09-26 LAB — URINALYSIS, ROUTINE W REFLEX MICROSCOPIC
Bilirubin Urine: NEGATIVE
Glucose, UA: NEGATIVE mg/dL
HGB URINE DIPSTICK: NEGATIVE
Ketones, ur: NEGATIVE mg/dL
Nitrite: NEGATIVE
Protein, ur: NEGATIVE mg/dL
SPECIFIC GRAVITY, URINE: 1.014 (ref 1.005–1.030)
UROBILINOGEN UA: 1 mg/dL (ref 0.0–1.0)
pH: 5.5 (ref 5.0–8.0)

## 2015-09-26 LAB — WET PREP, GENITAL
Clue Cells Wet Prep HPF POC: NONE SEEN
Trich, Wet Prep: NONE SEEN

## 2015-09-26 LAB — URINE MICROSCOPIC-ADD ON

## 2015-09-26 LAB — CBC
HEMATOCRIT: 37.8 % (ref 36.0–46.0)
HEMOGLOBIN: 12.9 g/dL (ref 12.0–15.0)
MCH: 29.5 pg (ref 26.0–34.0)
MCHC: 34.1 g/dL (ref 30.0–36.0)
MCV: 86.3 fL (ref 78.0–100.0)
Platelets: 253 10*3/uL (ref 150–400)
RBC: 4.38 MIL/uL (ref 3.87–5.11)
RDW: 12.5 % (ref 11.5–15.5)
WBC: 8.4 10*3/uL (ref 4.0–10.5)

## 2015-09-26 LAB — LIPASE, BLOOD: LIPASE: 35 U/L (ref 22–51)

## 2015-09-26 LAB — POC URINE PREG, ED: PREG TEST UR: NEGATIVE

## 2015-09-26 MED ORDER — ACETAMINOPHEN 325 MG PO TABS
650.0000 mg | ORAL_TABLET | Freq: Once | ORAL | Status: AC
  Administered 2015-09-26: 650 mg via ORAL
  Filled 2015-09-26: qty 2

## 2015-09-26 MED ORDER — IBUPROFEN 800 MG PO TABS
800.0000 mg | ORAL_TABLET | Freq: Once | ORAL | Status: AC
  Administered 2015-09-26: 800 mg via ORAL
  Filled 2015-09-26: qty 1

## 2015-09-26 MED ORDER — SODIUM CHLORIDE 0.9 % IV BOLUS (SEPSIS)
1000.0000 mL | Freq: Once | INTRAVENOUS | Status: AC
  Administered 2015-09-26: 1000 mL via INTRAVENOUS

## 2015-09-26 NOTE — ED Notes (Signed)
Pt states that she has been low abd pain radiating to back x 1 wk.  C/o NVD.  Also rt foot pain.

## 2015-09-26 NOTE — ED Provider Notes (Signed)
CSN: [id number]     Arrival date & time 09/26/15  1806 History   First MD Initiated Contact with Patient 09/26/15 1907     Chief Complaint  Patient presents with  . Abdominal Pain  . Fatigue  . Foot Pain     (Consider location/radiation/quality/duration/timing/severity/associated sxs/prior Treatment) HPI   Nicole [name] is a 39 y.o. female with PMH significant for fibroid, migraine, asthma who presents with constant, gradually worsening, throbbing/burning generalized abdominal pain x 2 days.  It is worse after she urinates.  Nothing makes it better.  She has tried pamprin and it has not helped her pain.  Endorses N/V, generalized weakness, SOB, back pain (chronic), chills, and abnormal vaginal discharge.  Denies fevers, diarrhea, CP, hematuria, dysuria, or increased frequency.  Patient states she has not been sexually active due to the discharge.  When asked about STDs, patient expresses some concern.    Past Medical History  Diagnosis Date  . Anemia   . Asthma   . Blood transfusion 2011    r/t anemia  . Migraine   . Sickle cell trait ([place])   . Seizures [facility])     last sz Jan 2012  . Seizures ([place])   . Fibroid   . Tooth abscess   . Sickle cell trait ([place])   . Migraine    Past Surgical History  Procedure Laterality Date  . Tubal ligation Bilateral 2005   Family History  Problem Relation Age of Onset  . Anesthesia problems Neg Hx   . Hypertension Mother   . Diabetes Mother   . Cancer Mother     Colon, brain, 2 other  . Asthma Mother   . Clotting disorder Mother   . Heart disease Mother     30, stents  . Stroke Mother     2012  . Diabetes Father   . Asthma Father   . Clotting disorder Father   . Sickle cell anemia Father   . Heart disease Father     2000, stents  . Hypertension Father   . Stroke Father     2007  . Diabetes Maternal Grandmother   . Asthma Sister   . Asthma Brother   . Asthma Paternal Grandmother    Social History  Substance Use Topics   . Smoking status: Former Smoker    Quit date: 03/27/2014  . Smokeless tobacco: Never Used  . Alcohol Use: No   OB History    Gravida Para Term Preterm AB TAB SAB Ectopic Multiple Living   3 2 1 1 1  0 0 1 0 2     Review of Systems All other systems negative unless otherwise stated in HPI   Allergies  Shrimp; Tea; Penicillins; and Sulfa antibiotics  Home Medications   Prior to Admission medications   Medication Sig Start Date End Date Taking? Authorizing Provider  albuterol (PROVENTIL HFA;VENTOLIN HFA) 108 (90 BASE) MCG/ACT inhaler Inhale 2 puffs into the lungs every 4 (four) hours as needed for wheezing or shortness of breath (or coughing). 03/11/15  Yes Shelly Bombard, MD  diazepam (VALIUM) 5 MG tablet Take 1 tablet (5 mg total) by mouth every 8 (eight) hours as needed (vertigo). 01/24/15  Yes Tanna Furry, MD  docusate sodium 100 MG CAPS Take 100 mg by mouth daily. 11/20/14  Yes Ashly M Gottschalk, DO  fexofenadine (ALLEGRA) 180 MG tablet Take 1 tablet (180 mg total) by mouth daily. 08/13/14  Yes Leone Brand, MD  gabapentin (NEURONTIN) 300  MG capsule Take 1 capsule (300 mg total) by mouth 3 (three) times daily. Patient taking differently: Take 300-1,200 mg by mouth 3 (three) times daily. Takes one capsule twice daily and then takes four capsules at bedtime. 01/25/15  Yes Tanna Furry, MD  levETIRAcetam (KEPPRA) 1000 MG tablet Take 1,000-2,000 mg by mouth 3 (three) times daily. Takes one tablet twice daily and two tablets at bedtime.   Yes Historical Provider, MD  loratadine (CLARITIN) 10 MG tablet Take 1 tablet (10 mg total) by mouth daily. 03/11/15  Yes Shelly Bombard, MD  medroxyPROGESTERone (DEPO-PROVERA) 150 MG/ML injection Inject 1 mL (150 mg total) into the muscle every 3 (three) months. Inject on  the 22nd of each month 03/11/15  Yes Shelly Bombard, MD  naproxen sodium (ANAPROX DS) 550 MG tablet Take 1 tablet (550 mg total) by mouth 2 (two) times daily with a meal. Patient  taking differently: Take 550 mg by mouth 2 (two) times daily.  03/11/15  Yes Shelly Bombard, MD  omeprazole (PRILOSEC) 20 MG capsule Take 1 capsule (20 mg total) by mouth daily. 07/26/15  Yes Waynetta Pean, PA-C  Prenatal Vit-Fe Fumarate-FA (PNV PRENATAL PLUS MULTIVITAMIN) 27-1 MG TABS Take 1 tablet by mouth daily. 07/22/14  Yes Historical Provider, MD  topiramate (TOPAMAX) 25 MG tablet Take 75 mg by mouth 2 (two) times daily.    Yes Historical Provider, MD  acetaminophen (TYLENOL) 500 MG tablet Take 1 tablet (500 mg total) by mouth every 6 (six) hours as needed. 09/27/15   Gloriann Loan, PA-C  clonazePAM (KLONOPIN) 1 MG tablet Take 1 mg by mouth 2 (two) times daily.    Historical Provider, MD  clotrimazole (GYNE-LOTRIMIN) 1 % vaginal cream Place 1 Applicatorful vaginally at bedtime. Place 1 applicatorful vaginally at bedtime for 7 days. 09/27/15   Gloriann Loan, PA-C  doxycycline (VIBRAMYCIN) 100 MG capsule Take 1 capsule (100 mg total) by mouth 2 (two) times daily. 09/27/15   Gloriann Loan, PA-C  ibuprofen (ADVIL,MOTRIN) 800 MG tablet Take 1 tablet (800 mg total) by mouth 3 (three) times daily. 09/27/15   Brenton Joines, PA-C  levETIRAcetam (KEPPRA) 750 MG tablet TAKE 1 TABLET (750 MG TOTAL) BY MOUTH 2 (TWO) TIMES DAILY. Patient not taking: Reported on 03/08/2015 12/14/14   Leone Brand, MD  metroNIDAZOLE (FLAGYL) 500 MG tablet Take 1 tablet (500 mg total) by mouth 2 (two) times daily. 09/27/15   Eaton Folmar, PA-C   BP 114/80 mmHg  Pulse 71  Temp(Src) 98.4 F (36.9 C) (Oral)  Resp 16  SpO2 99% Physical Exam  Constitutional: She is oriented to person, place, and time. She appears well-developed and well-nourished.  HENT:  Head: Normocephalic and atraumatic.  Mouth/Throat: Oropharynx is clear and moist.  Eyes: Conjunctivae are normal. Pupils are equal, round, and reactive to light.  Neck: Normal range of motion. Neck supple.  Cardiovascular: Normal rate, regular rhythm and normal heart sounds.   No  murmur heard. Pulmonary/Chest: Effort normal and breath sounds normal. No accessory muscle usage or stridor. No respiratory distress. She has no wheezes. She has no rhonchi. She has no rales.  Abdominal: Soft. Bowel sounds are normal. She exhibits no distension. There is tenderness in the right lower quadrant and suprapubic area. There is no rigidity, no rebound and no guarding.  Genitourinary: Uterus normal. There is no rash, tenderness or lesion on the right labia. There is no rash, tenderness or lesion on the left labia. Cervix exhibits motion tenderness and discharge. Right  adnexum displays tenderness. Left adnexum displays tenderness. No tenderness or bleeding in the vagina. Vaginal discharge found.  White chunky discharge seen in vagina and cervix.  Malodorous discharge.    Musculoskeletal: Normal range of motion.  Right foot without swelling, erythema, or deformity.  FROM in plantar and dorsiflexion.   Lymphadenopathy:    She has no cervical adenopathy.  Neurological: She is alert and oriented to person, place, and time.  Speech clear without dysarthria.  Cranial nerves grossly intact.  Strength and sensation intact bilaterally throughout upper and lower extremities.   Skin: Skin is warm and dry.  Psychiatric: She has a normal mood and affect. Her behavior is normal.    ED Course  Procedures (including critical care time) Labs Review Labs Reviewed  WET PREP, GENITAL - Abnormal; Notable for the following:    Yeast Wet Prep HPF POC FEW (*)    WBC, Wet Prep HPF POC TOO NUMEROUS TO COUNT (*)    All other components within normal limits  COMPREHENSIVE METABOLIC PANEL - Abnormal; Notable for the following:    CO2 19 (*)    Glucose, Bld 119 (*)    ALT 13 (*)    All other components within normal limits  URINALYSIS, ROUTINE W REFLEX MICROSCOPIC (NOT AT [facility]) - Abnormal; Notable for the following:    Leukocytes, UA MODERATE (*)    All other components within normal limits  LIPASE, BLOOD   CBC  URINE MICROSCOPIC-ADD ON  POC URINE PREG, ED  GC/CHLAMYDIA PROBE AMP ([place]) NOT AT [facility]    Imaging Review Dg Chest 2 View  09/26/2015  CLINICAL DATA:  Shortness of breath for 1 week, cough. EXAM: CHEST  2 VIEW COMPARISON:  07/26/2015 FINDINGS: The cardiomediastinal contours are normal. The lungs are clear. Pulmonary vasculature is normal. No consolidation, pleural effusion, or pneumothorax. No acute osseous abnormalities are seen. IMPRESSION: No acute pulmonary process. Electronically Signed   By: Jeb Levering M.D.   On: 09/26/2015 20:03   US Transvaginal Non-ob  09/27/2015  CLINICAL DATA:  Acute onset of lower abdominal pain for 1 week, right greater than left. Initial encounter. EXAM: TRANSABDOMINAL AND TRANSVAGINAL ULTRASOUND OF PELVIS TECHNIQUE: Both transabdominal and transvaginal ultrasound examinations of the pelvis were performed. Transabdominal technique was performed for global imaging of the pelvis including uterus, ovaries, adnexal regions, and pelvic cul-de-sac. It was necessary to proceed with endovaginal exam following the transabdominal exam to visualize the uterus and ovaries in greater detail. COMPARISON:  CT of the abdomen and pelvis, and pelvic ultrasound, performed 03/05/2015 FINDINGS: Uterus Measurements: 12.0 x 5.0 x 5.7 cm. A 1.7 x 1.6 x 1.2 cm intramural fibroid is noted along the anterior aspect of the uterus. Endometrium Thickness: 0.3 cm.  No focal abnormality visualized. Right ovary Measurements: 3.5 x 2.8 x 3.1 cm. Normal appearance/no adnexal mass. Left ovary Measurements: 3.1 x 2.5 x 2.1 cm. Normal appearance/no adnexal mass. Other findings No free fluid is seen within the pelvic cul-de-sac. IMPRESSION: 1. No evidence for ovarian torsion. 2. 1.7 cm intramural fibroid along the anterior aspect of the uterus. Uterus otherwise unremarkable in appearance. Electronically Signed   By: Garald Balding M.D.   On: 09/27/2015 00:00   US Pelvis  Complete  09/27/2015  CLINICAL DATA:  Acute onset of lower abdominal pain for 1 week, right greater than left. Initial encounter. EXAM: TRANSABDOMINAL AND TRANSVAGINAL ULTRASOUND OF PELVIS TECHNIQUE: Both transabdominal and transvaginal ultrasound examinations of the pelvis were performed. Transabdominal technique was performed for  global imaging of the pelvis including uterus, ovaries, adnexal regions, and pelvic cul-de-sac. It was necessary to proceed with endovaginal exam following the transabdominal exam to visualize the uterus and ovaries in greater detail. COMPARISON:  CT of the abdomen and pelvis, and pelvic ultrasound, performed 03/05/2015 FINDINGS: Uterus Measurements: 12.0 x 5.0 x 5.7 cm. A 1.7 x 1.6 x 1.2 cm intramural fibroid is noted along the anterior aspect of the uterus. Endometrium Thickness: 0.3 cm.  No focal abnormality visualized. Right ovary Measurements: 3.5 x 2.8 x 3.1 cm. Normal appearance/no adnexal mass. Left ovary Measurements: 3.1 x 2.5 x 2.1 cm. Normal appearance/no adnexal mass. Other findings No free fluid is seen within the pelvic cul-de-sac. IMPRESSION: 1. No evidence for ovarian torsion. 2. 1.7 cm intramural fibroid along the anterior aspect of the uterus. Uterus otherwise unremarkable in appearance. Electronically Signed   By: Garald Balding M.D.   On: 09/27/2015 00:00   I have personally reviewed and evaluated these images and lab results as part of my medical decision-making.   EKG Interpretation None      MDM   Final diagnoses:  Pelvic inflammatory disease    Patient presents with 2 day history of gradually worsening abdominal pain.  VSS, patient appears nontoxic, NAD.  On exam, suprapubic tenderness and RLQ, no rebound, guarding, or rigidity.  On GU exam, there is CMT and adnexal tenderness.  Labs include urine pregnancy, lipase, CMP, CBC, UA.  UA shows evidence of possible UTI.  Wet prep shows TNTC WBCs and few yeast.  CBC, CMP, and lipase unremarkable.   Given adnexal tenderness and moderate CMT, will obtain pelvic ultrasound to evaluate for tubo-ovarian abscess.  If ultrasound negative will treat for PID and Candida.  Patient sign out to Junius Creamer, NP at shift change.  Patient receiving IV doxycycline per pharmacy consult due to PCN allergy.  Patient to be discharged once received abx.           Gloriann Loan, PA-C 09/27/15 0050  Lacretia Leigh, MD 09/29/15 [phone]

## 2015-09-27 LAB — GC/CHLAMYDIA PROBE AMP (~~LOC~~) NOT AT ARMC
CHLAMYDIA, DNA PROBE: NEGATIVE
NEISSERIA GONORRHEA: NEGATIVE

## 2015-09-27 MED ORDER — ACETAMINOPHEN 500 MG PO TABS: 500.0000 mg | ORAL_TABLET | Freq: Four times a day (QID) | ORAL | Status: DC | PRN

## 2015-09-27 MED ORDER — IBUPROFEN 800 MG PO TABS: 800.0000 mg | ORAL_TABLET | Freq: Three times a day (TID) | ORAL | Status: DC

## 2015-09-27 MED ORDER — ACETAMINOPHEN 325 MG PO TABS
650.0000 mg | ORAL_TABLET | Freq: Once | ORAL | Status: AC
  Administered 2015-09-27: 650 mg via ORAL
  Filled 2015-09-27: qty 2

## 2015-09-27 MED ORDER — DOXYCYCLINE HYCLATE 100 MG PO CAPS: 100.0000 mg | ORAL_CAPSULE | Freq: Two times a day (BID) | ORAL | Status: DC

## 2015-09-27 MED ORDER — METRONIDAZOLE 500 MG PO TABS: 500.0000 mg | ORAL_TABLET | Freq: Two times a day (BID) | ORAL | Status: DC

## 2015-09-27 MED ORDER — DOXYCYCLINE HYCLATE 100 MG IV SOLR
100.0000 mg | Freq: Once | INTRAVENOUS | Status: AC
  Administered 2015-09-27: 100 mg via INTRAVENOUS
  Filled 2015-09-27: qty 100

## 2015-09-27 MED ORDER — CLOTRIMAZOLE 1 % VA CREA: 1.0000 | TOPICAL_CREAM | Freq: Every day | VAGINAL | Status: DC

## 2015-09-27 MED ORDER — IBUPROFEN 800 MG PO TABS
800.0000 mg | ORAL_TABLET | Freq: Once | ORAL | Status: AC
  Administered 2015-09-27: 800 mg via ORAL
  Filled 2015-09-27: qty 1

## 2015-09-27 NOTE — Discharge Instructions (Signed)
Pelvic Inflammatory Disease Pelvic inflammatory disease (PID) is an infection in some or all of the female organs. PID can be in the uterus, ovaries, fallopian tubes, or the surrounding tissues that are inside the lower belly area (pelvis). PID can lead to lasting problems if it is not treated. To check for this disease, your doctor may:  Do a physical exam.  Do blood tests, urine tests, or a pregnancy test.  Look at your vaginal discharge.  Do tests to look inside the pelvis.  Test you for other infections. HOME CARE  Take over-the-counter and prescription medicines only as told by your doctor.  If you were prescribed an antibiotic medicine, take it as told by your doctor. Do not stop taking it even if you start to feel better.  Do not have sex until treatment is done or as told by your doctor.  Tell your sex partner if you have PID. Your partner may need to be treated.  Keep all follow-up visits as told by your doctor. This is important.  Your doctor may test you for infection again 3 months after you are treated. GET HELP IF:  You have more fluid (discharge) coming from your vagina or fluid that is not normal.  Your pain does not improve.  You throw up (vomit).  You have a fever.  You cannot take your medicines.  Your partner has a sexually transmitted disease (STD).  You have pain when you pee (urinate). GET HELP RIGHT AWAY IF:  You have more belly (abdominal) or lower belly pain.  You have chills.  You are not better after 72 hours.   This information is not intended to replace advice given to you by your health care provider. Make sure you discuss any questions you have with your health care provider.   Document Released: 02/23/2009 Document Revised: 08/18/2015 Document Reviewed: 01/04/2015 Elsevier Interactive Patient Education [employer].

## 2015-10-19 ENCOUNTER — Observation Stay (HOSPITAL_COMMUNITY)
Admission: EM | Admit: 2015-10-19 | Discharge: 2015-10-23 | Disposition: A | Discharge status: Home Health | Payer: Self-pay | Attending: Family Medicine | Admitting: Family Medicine

## 2015-10-19 ENCOUNTER — Encounter (HOSPITAL_COMMUNITY): Payer: Self-pay | Admitting: Vascular Surgery

## 2015-10-19 ENCOUNTER — Emergency Department (HOSPITAL_COMMUNITY): Payer: Self-pay

## 2015-10-19 DIAGNOSIS — R55 Syncope and collapse: Secondary | ICD-10-CM | POA: Insufficient documentation

## 2015-10-19 DIAGNOSIS — Z9114 Patient's other noncompliance with medication regimen: Secondary | ICD-10-CM | POA: Insufficient documentation

## 2015-10-19 DIAGNOSIS — G4733 Obstructive sleep apnea (adult) (pediatric): Secondary | ICD-10-CM | POA: Insufficient documentation

## 2015-10-19 DIAGNOSIS — Z79899 Other long term (current) drug therapy: Secondary | ICD-10-CM | POA: Insufficient documentation

## 2015-10-19 DIAGNOSIS — R262 Difficulty in walking, not elsewhere classified: Secondary | ICD-10-CM | POA: Insufficient documentation

## 2015-10-19 DIAGNOSIS — G40909 Epilepsy, unspecified, not intractable, without status epilepticus: Secondary | ICD-10-CM | POA: Insufficient documentation

## 2015-10-19 DIAGNOSIS — K219 Gastro-esophageal reflux disease without esophagitis: Secondary | ICD-10-CM | POA: Diagnosis present

## 2015-10-19 DIAGNOSIS — Z87891 Personal history of nicotine dependence: Secondary | ICD-10-CM | POA: Insufficient documentation

## 2015-10-19 DIAGNOSIS — J45909 Unspecified asthma, uncomplicated: Secondary | ICD-10-CM | POA: Insufficient documentation

## 2015-10-19 DIAGNOSIS — F329 Major depressive disorder, single episode, unspecified: Secondary | ICD-10-CM | POA: Insufficient documentation

## 2015-10-19 DIAGNOSIS — R569 Unspecified convulsions: Secondary | ICD-10-CM

## 2015-10-19 DIAGNOSIS — Z880 Allergy status to penicillin: Secondary | ICD-10-CM | POA: Insufficient documentation

## 2015-10-19 DIAGNOSIS — Z23 Encounter for immunization: Secondary | ICD-10-CM | POA: Insufficient documentation

## 2015-10-19 DIAGNOSIS — R531 Weakness: Principal | ICD-10-CM

## 2015-10-19 DIAGNOSIS — D573 Sickle-cell trait: Secondary | ICD-10-CM | POA: Insufficient documentation

## 2015-10-19 DIAGNOSIS — R42 Dizziness and giddiness: Secondary | ICD-10-CM | POA: Insufficient documentation

## 2015-10-19 DIAGNOSIS — F4321 Adjustment disorder with depressed mood: Secondary | ICD-10-CM | POA: Diagnosis present

## 2015-10-19 DIAGNOSIS — R51 Headache: Secondary | ICD-10-CM | POA: Insufficient documentation

## 2015-10-19 LAB — CBC WITH DIFFERENTIAL/PLATELET
BASOS ABS: 0.1 10*3/uL (ref 0.0–0.1)
BASOS PCT: 1 %
EOS ABS: 0.4 10*3/uL (ref 0.0–0.7)
EOS PCT: 6 %
HCT: 39.6 % (ref 36.0–46.0)
Hemoglobin: 13.4 g/dL (ref 12.0–15.0)
Lymphocytes Relative: 42 %
Lymphs Abs: 2.8 10*3/uL (ref 0.7–4.0)
MCH: 29.7 pg (ref 26.0–34.0)
MCHC: 33.8 g/dL (ref 30.0–36.0)
MCV: 87.8 fL (ref 78.0–100.0)
MONO ABS: 0.4 10*3/uL (ref 0.1–1.0)
MONOS PCT: 6 %
Neutro Abs: 3.1 10*3/uL (ref 1.7–7.7)
Neutrophils Relative %: 45 %
PLATELETS: 261 10*3/uL (ref 150–400)
RBC: 4.51 MIL/uL (ref 3.87–5.11)
RDW: 13 % (ref 11.5–15.5)
WBC: 6.8 10*3/uL (ref 4.0–10.5)

## 2015-10-19 LAB — COMPREHENSIVE METABOLIC PANEL
ALBUMIN: 3.7 g/dL (ref 3.5–5.0)
ALT: 14 U/L (ref 14–54)
AST: 15 U/L (ref 15–41)
Alkaline Phosphatase: 79 U/L (ref 38–126)
Anion gap: 9 (ref 5–15)
BILIRUBIN TOTAL: 0.6 mg/dL (ref 0.3–1.2)
BUN: 12 mg/dL (ref 6–20)
CHLORIDE: 111 mmol/L (ref 101–111)
CO2: 18 mmol/L — AB (ref 22–32)
Calcium: 9.3 mg/dL (ref 8.9–10.3)
Creatinine, Ser: 0.84 mg/dL (ref 0.44–1.00)
GFR calc Af Amer: >60 mL/min (ref >60)
GFR calc non Af Amer: >60 mL/min (ref >60)
GLUCOSE: 97 mg/dL (ref 65–99)
POTASSIUM: 3.9 mmol/L (ref 3.5–5.1)
Sodium: 138 mmol/L (ref 135–145)
TOTAL PROTEIN: 6.5 g/dL (ref 6.5–8.1)

## 2015-10-19 LAB — I-STAT CHEM 8, ED
BUN: 13 mg/dL (ref 6–20)
CALCIUM ION: 1.26 mmol/L — AB (ref 1.12–1.23)
CHLORIDE: 109 mmol/L (ref 101–111)
Creatinine, Ser: 0.8 mg/dL (ref 0.44–1.00)
Glucose, Bld: 91 mg/dL (ref 65–99)
HEMATOCRIT: 43 % (ref 36.0–46.0)
Hemoglobin: 14.6 g/dL (ref 12.0–15.0)
Potassium: 3.9 mmol/L (ref 3.5–5.1)
SODIUM: 141 mmol/L (ref 135–145)
TCO2: 18 mmol/L (ref 0–100)

## 2015-10-19 LAB — I-STAT TROPONIN, ED: TROPONIN I, POC: 0 ng/mL (ref 0.00–0.08)

## 2015-10-19 LAB — TYPE AND SCREEN
ABO/RH(D): AB POS
Antibody Screen: NEGATIVE

## 2015-10-19 LAB — CBG MONITORING, ED: GLUCOSE-CAPILLARY: 106 mg/dL — AB (ref 65–99)

## 2015-10-19 LAB — I-STAT BETA HCG BLOOD, ED (MC, WL, AP ONLY)

## 2015-10-19 MED ORDER — ACETAMINOPHEN 325 MG PO TABS
650.0000 mg | ORAL_TABLET | Freq: Four times a day (QID) | ORAL | Status: DC | PRN
  Administered 2015-10-19 – 2015-10-22 (×7): 650 mg via ORAL
  Filled 2015-10-19 (×7): qty 2

## 2015-10-19 MED ORDER — SODIUM CHLORIDE 0.9 % IV BOLUS (SEPSIS)
1000.0000 mL | Freq: Once | INTRAVENOUS | Status: AC
Start: 2015-10-19 — End: 2015-10-20
  Administered 2015-10-19: 1000 mL via INTRAVENOUS

## 2015-10-19 NOTE — ED Provider Notes (Signed)
CSN: [id number]     Arrival date & time 10/19/15  1842 History   First MD Initiated Contact with Patient 10/19/15 2037     Chief Complaint  Patient presents with  . Seizures     (Consider location/radiation/quality/duration/timing/severity/associated sxs/prior Treatment) HPI Comments: The patient is a 39 year old female, she has a known history of seizure disorder, she also has a history of fibroids and blood loss as well as needing a blood transfusion because of anemia in 2011.  She takes medications including Keppra 3000 mg a day, Neurontin, and Topamax for her headaches. She has been on these medications for 6 months, 3 days ago she started to have frequent falling spells, feels like she is blacking out, she notes that when she stands up she feels as though she is going to pass out. She did notice 1 stool with blood in it, she denies any other symptoms including fevers, chills, nausea, vomiting, shortness of breath. Symptoms are intermittent, they always seem to occur when she is standing, she has been unable to do much walking today because of this  Patient is a 39 y.o. female presenting with seizures. The history is provided by the patient.  Seizures   Past Medical History  Diagnosis Date  . Anemia   . Asthma   . Blood transfusion 2011    r/t anemia  . Migraine   . Sickle cell trait ([place])   . Seizures [facility])     last sz Jan 2012  . Seizures ([place])   . Fibroid   . Tooth abscess   . Sickle cell trait ([place])   . Migraine    Past Surgical History  Procedure Laterality Date  . Tubal ligation Bilateral 2005   Family History  Problem Relation Age of Onset  . Anesthesia problems Neg Hx   . Hypertension Mother   . Diabetes Mother   . Cancer Mother     Colon, brain, 2 other  . Asthma Mother   . Clotting disorder Mother   . Heart disease Mother     2, stents  . Stroke Mother     2012  . Diabetes Father   . Asthma Father   . Clotting disorder Father   . Sickle cell anemia  Father   . Heart disease Father     2000, stents  . Hypertension Father   . Stroke Father     2007  . Diabetes Maternal Grandmother   . Asthma Sister   . Asthma Brother   . Asthma Paternal Grandmother    Social History  Substance Use Topics  . Smoking status: Former Smoker    Quit date: 03/27/2014  . Smokeless tobacco: Never Used  . Alcohol Use: No   OB History    Gravida Para Term Preterm AB TAB SAB Ectopic Multiple Living   3 2 1 1 1  0 0 1 0 2     Review of Systems  Neurological: Positive for seizures.  All other systems reviewed and are negative.     Allergies  Shrimp; Tea; Penicillins; and Sulfa antibiotics  Home Medications   Prior to Admission medications   Medication Sig Start Date End Date Taking? Authorizing Provider  acetaminophen (TYLENOL) 500 MG tablet Take 1 tablet (500 mg total) by mouth every 6 (six) hours as needed. 09/27/15  Yes Gloriann Loan, PA-C  albuterol (PROVENTIL HFA;VENTOLIN HFA) 108 (90 BASE) MCG/ACT inhaler Inhale 2 puffs into the lungs every 4 (four) hours as needed for wheezing or shortness  of breath (or coughing). 03/11/15  Yes Shelly Bombard, MD  clonazePAM (KLONOPIN) 1 MG tablet Take 1 mg by mouth 2 (two) times daily.   Yes Historical Provider, MD  clotrimazole (GYNE-LOTRIMIN) 1 % vaginal cream Place 1 Applicatorful vaginally at bedtime. Place 1 applicatorful vaginally at bedtime for 7 days. 09/27/15  Yes Kayla Rose, PA-C  diazepam (VALIUM) 5 MG tablet Take 1 tablet (5 mg total) by mouth every 8 (eight) hours as needed (vertigo). 01/24/15  Yes Tanna Furry, MD  docusate sodium 100 MG CAPS Take 100 mg by mouth daily. 11/20/14  Yes Ashly M Gottschalk, DO  fexofenadine (ALLEGRA) 180 MG tablet Take 1 tablet (180 mg total) by mouth daily. 08/13/14  Yes Leone Brand, MD  gabapentin (NEURONTIN) 300 MG capsule Take 1 capsule (300 mg total) by mouth 3 (three) times daily. Patient taking differently: Take 300-1,200 mg by mouth 3 (three) times daily. Takes  one capsule twice daily and then takes four capsules at bedtime. 01/25/15  Yes Tanna Furry, MD  levETIRAcetam (KEPPRA) 1000 MG tablet Take 1,000-2,000 mg by mouth 3 (three) times daily. Takes one tablet twice daily and two tablets at bedtime.   Yes Historical Provider, MD  loratadine (CLARITIN) 10 MG tablet Take 1 tablet (10 mg total) by mouth daily. 03/11/15  Yes Shelly Bombard, MD  medroxyPROGESTERone (DEPO-PROVERA) 150 MG/ML injection Inject 1 mL (150 mg total) into the muscle every 3 (three) months. Inject on  the 22nd of each month 03/11/15  Yes Shelly Bombard, MD  naproxen sodium (ANAPROX DS) 550 MG tablet Take 1 tablet (550 mg total) by mouth 2 (two) times daily with a meal. Patient taking differently: Take 550 mg by mouth 2 (two) times daily.  03/11/15  Yes Shelly Bombard, MD  omeprazole (PRILOSEC) 20 MG capsule Take 1 capsule (20 mg total) by mouth daily. 07/26/15  Yes Waynetta Pean, PA-C  Prenatal Vit-Fe Fumarate-FA (PNV PRENATAL PLUS MULTIVITAMIN) 27-1 MG TABS Take 1 tablet by mouth daily. 07/22/14  Yes Historical Provider, MD  topiramate (TOPAMAX) 25 MG tablet Take 75 mg by mouth 2 (two) times daily.    Yes Historical Provider, MD  ibuprofen (ADVIL,MOTRIN) 800 MG tablet Take 1 tablet (800 mg total) by mouth 3 (three) times daily. 09/27/15   Kayla Rose, PA-C   BP 92/60 mmHg  Pulse 56  Temp(Src) 97.9 F (36.6 C) (Oral)  Resp 26  SpO2 96% Physical Exam  Constitutional: She appears well-developed and well-nourished. No distress.  HENT:  Head: Normocephalic and atraumatic.  Mouth/Throat: Oropharynx is clear and moist. No oropharyngeal exudate.  Eyes: Conjunctivae and EOM are normal. Pupils are equal, round, and reactive to light. Right eye exhibits no discharge. Left eye exhibits no discharge. No scleral icterus.  Neck: Normal range of motion. Neck supple. No JVD present. No thyromegaly present.  Cardiovascular: Normal rate, regular rhythm, normal heart sounds and intact distal pulses.   Exam reveals no gallop and no friction rub.   No murmur heard. Pulmonary/Chest: Effort normal and breath sounds normal. No respiratory distress. She has no wheezes. She has no rales.  Abdominal: Soft. Bowel sounds are normal. She exhibits no distension and no mass. There is no tenderness.  Musculoskeletal: Normal range of motion. She exhibits no edema or tenderness.  Lymphadenopathy:    She has no cervical adenopathy.  Neurological: She is alert. Coordination normal.   Generalized weakness diffusely, speaks but occasionally has some confusion and slow speech, follows commands, 4 out of  5 strength in all 4 extremities  Skin: Skin is warm and dry. No rash noted. No erythema.  Psychiatric: She has a normal mood and affect. Her behavior is normal.  Nursing note and vitals reviewed.   ED Course  Procedures (including critical care time) Labs Review Labs Reviewed  COMPREHENSIVE METABOLIC PANEL - Abnormal; Notable for the following:    CO2 18 (*)    All other components within normal limits  CBG MONITORING, ED - Abnormal; Notable for the following:    Glucose-Capillary 106 (*)    All other components within normal limits  I-STAT CHEM 8, ED - Abnormal; Notable for the following:    Calcium, Ion 1.26 (*)    All other components within normal limits  CBC WITH DIFFERENTIAL/PLATELET  URINALYSIS, ROUTINE W REFLEX MICROSCOPIC (NOT AT [facility])  URINE RAPID DRUG SCREEN, HOSP PERFORMED  LEVETIRACETAM LEVEL  POC URINE PREG, ED  I-STAT BETA HCG BLOOD, ED ([facility], [facility], [facility] ONLY)  I-STAT TROPOININ, ED  TYPE AND SCREEN    Imaging Review Ct Head Wo Contrast  10/19/2015  CLINICAL DATA:  Confusion and weakness tonight. EXAM: CT HEAD WITHOUT CONTRAST TECHNIQUE: Contiguous axial images were obtained from the base of the skull through the vertex without intravenous contrast. COMPARISON:  Multiple prior head CTs.  The most recent is 01/24/2015 FINDINGS: The ventricles are normal in size and configuration. No  extra-axial fluid collections are identified. The gray-white differentiation is normal. No CT findings for acute intracranial process such as hemorrhage or infarction. No mass lesions. Stable dural calcification posteriorly. The brainstem and cerebellum are grossly normal. The bony structures are intact. The paranasal sinuses and mastoid air cells are clear except for scattered ethmoid sinus disease. The globes are intact. IMPRESSION: No acute intracranial findings or mass lesion. No change since prior head CTs. Scattered ethmoid sinus disease. Electronically Signed   By: Marijo Sanes M.D.   On: 10/19/2015 23:00   I have personally reviewed and evaluated these images and lab results as part of my medical decision-making.   EKG Interpretation   Date/Time:  Tuesday October 19 2015 21:34:14 EST Ventricular Rate:  68 PR Interval:  168 QRS Duration: 84 QT Interval:  405 QTC Calculation: 431 R Axis:   57 Text Interpretation:  Sinus rhythm Abnormal R-wave progression, early  transition Since last tracing rate slower Confirmed by Zaira Iacovelli  MD, Mackenzy Eisenberg  ([id number]) on 10/20/2015 12:09:53 AM      MDM   Final diagnoses:  None    The patient has a fairly normal exam except for her mental status which appears very mildly somnolent and general weakness. Vital signs at this time show hypotension at 94/61. No other abnormal vitals.  Vital signs have shown mild hypotension, minimal orthostatic changes, labs overall very unremarkable, this was discussed with the patient, I discussed her care with the neurologist Dr. Nicole Kindred who recommends an EEG in the morning.  Discussed with the family practice residents who will admit. Holding orders requested  Noemi Chapel, MD 10/20/15 [phone]

## 2015-10-19 NOTE — ED Notes (Signed)
Patient transported to CT 

## 2015-10-19 NOTE — ED Notes (Signed)
Pt reports to the ED for eval of increased seizures. She has hx of same and is on several medication which she states she has been compliant with. She has been having more in the past few days. Last one 2 hours ago. She was incontinent of urine. No oral injury. Appears lethargic. Alert and oriented to person, situation, and place. Disoriented to times. Unsure if she hit her head.

## 2015-10-20 ENCOUNTER — Observation Stay (HOSPITAL_COMMUNITY): Payer: Self-pay

## 2015-10-20 ENCOUNTER — Observation Stay (HOSPITAL_COMMUNITY): Payer: No Typology Code available for payment source

## 2015-10-20 ENCOUNTER — Encounter (HOSPITAL_COMMUNITY): Payer: Self-pay | Admitting: *Deleted

## 2015-10-20 DIAGNOSIS — R531 Weakness: Secondary | ICD-10-CM

## 2015-10-20 DIAGNOSIS — G40909 Epilepsy, unspecified, not intractable, without status epilepticus: Secondary | ICD-10-CM

## 2015-10-20 DIAGNOSIS — H8110 Benign paroxysmal vertigo, unspecified ear: Secondary | ICD-10-CM

## 2015-10-20 DIAGNOSIS — K219 Gastro-esophageal reflux disease without esophagitis: Secondary | ICD-10-CM

## 2015-10-20 DIAGNOSIS — R569 Unspecified convulsions: Secondary | ICD-10-CM

## 2015-10-20 LAB — URINE MICROSCOPIC-ADD ON

## 2015-10-20 LAB — URINALYSIS, ROUTINE W REFLEX MICROSCOPIC
Bilirubin Urine: NEGATIVE
Glucose, UA: NEGATIVE mg/dL
Ketones, ur: NEGATIVE mg/dL
Nitrite: NEGATIVE
PROTEIN: NEGATIVE mg/dL
Specific Gravity, Urine: 1.029 (ref 1.005–1.030)
UROBILINOGEN UA: 1 mg/dL (ref 0.0–1.0)
pH: 5.5 (ref 5.0–8.0)

## 2015-10-20 LAB — RAPID URINE DRUG SCREEN, HOSP PERFORMED
AMPHETAMINES: NOT DETECTED
BENZODIAZEPINES: NOT DETECTED
Barbiturates: NOT DETECTED
COCAINE: NOT DETECTED
OPIATES: NOT DETECTED
Tetrahydrocannabinol: NOT DETECTED

## 2015-10-20 LAB — MRSA PCR SCREENING: MRSA by PCR: NEGATIVE

## 2015-10-20 LAB — POC URINE PREG, ED: Preg Test, Ur: NEGATIVE

## 2015-10-20 LAB — TSH: TSH: 1.98 u[IU]/mL (ref 0.350–4.500)

## 2015-10-20 LAB — SEDIMENTATION RATE: Sed Rate: 11 mm/hr (ref 0–22)

## 2015-10-20 MED ORDER — LACOSAMIDE 50 MG PO TABS
100.0000 mg | ORAL_TABLET | Freq: Two times a day (BID) | ORAL | Status: DC
  Administered 2015-10-20 – 2015-10-23 (×6): 100 mg via ORAL
  Filled 2015-10-20 (×6): qty 2

## 2015-10-20 MED ORDER — ALBUTEROL SULFATE (2.5 MG/3ML) 0.083% IN NEBU: 3.0000 mL | INHALATION_SOLUTION | RESPIRATORY_TRACT | Status: DC | PRN

## 2015-10-20 MED ORDER — INFLUENZA VAC SPLIT QUAD 0.5 ML IM SUSY
0.5000 mL | PREFILLED_SYRINGE | INTRAMUSCULAR | Status: AC
  Administered 2015-10-21: 0.5 mL via INTRAMUSCULAR
  Filled 2015-10-20: qty 0.5

## 2015-10-20 MED ORDER — TOPIRAMATE 25 MG PO TABS
75.0000 mg | ORAL_TABLET | Freq: Two times a day (BID) | ORAL | Status: DC
  Administered 2015-10-20 – 2015-10-21 (×3): 75 mg via ORAL
  Filled 2015-10-20 (×3): qty 3

## 2015-10-20 MED ORDER — ENOXAPARIN SODIUM 40 MG/0.4ML ~~LOC~~ SOLN
40.0000 mg | SUBCUTANEOUS | Status: DC
  Administered 2015-10-20 – 2015-10-22 (×3): 40 mg via SUBCUTANEOUS
  Filled 2015-10-20 (×3): qty 0.4

## 2015-10-20 MED ORDER — SODIUM CHLORIDE 0.9 % IV SOLN
INTRAVENOUS | Status: AC
  Administered 2015-10-20: 100 mL/h via INTRAVENOUS

## 2015-10-20 MED ORDER — SODIUM CHLORIDE 0.9 % IV SOLN
200.0000 mg | Freq: Once | INTRAVENOUS | Status: AC
  Administered 2015-10-20: 200 mg via INTRAVENOUS
  Filled 2015-10-20: qty 20

## 2015-10-20 MED ORDER — LEVETIRACETAM 500 MG PO TABS
1000.0000 mg | ORAL_TABLET | Freq: Two times a day (BID) | ORAL | Status: DC
  Administered 2015-10-20: 1000 mg via ORAL
  Filled 2015-10-20: qty 2

## 2015-10-20 MED ORDER — PANTOPRAZOLE SODIUM 40 MG PO TBEC
80.0000 mg | DELAYED_RELEASE_TABLET | Freq: Every day | ORAL | Status: DC
  Administered 2015-10-20 – 2015-10-23 (×4): 80 mg via ORAL
  Filled 2015-10-20 (×4): qty 2

## 2015-10-20 MED ORDER — ONDANSETRON HCL 4 MG/2ML IJ SOLN: 4.0000 mg | Freq: Three times a day (TID) | INTRAMUSCULAR | Status: AC | PRN

## 2015-10-20 MED ORDER — LEVETIRACETAM 750 MG PO TABS
1500.0000 mg | ORAL_TABLET | Freq: Two times a day (BID) | ORAL | Status: DC
  Administered 2015-10-20: 1500 mg via ORAL
  Filled 2015-10-20 (×2): qty 2

## 2015-10-20 MED ORDER — LORAZEPAM 2 MG/ML IJ SOLN: 1.0000 mg | INTRAMUSCULAR | Status: DC | PRN

## 2015-10-20 MED ORDER — LEVETIRACETAM 750 MG PO TABS: 2000.0000 mg | ORAL_TABLET | Freq: Every day | ORAL | Status: DC

## 2015-10-20 NOTE — Progress Notes (Addendum)
Spoke with MD Dallas Schimke making her aware that the patient is unable to stand up and has extreme fatigue, therefore we are unable to obtain orthostatic VS at this time.

## 2015-10-20 NOTE — Progress Notes (Signed)
Spoke with Patty in MRI, patient VS are stable and have been recorded in EPIC. Per MD Dallas Schimke patient is to proceed with MRI. This has been relayed to [facility] in MRI whom verbalized she will continue with MRI and update myself with any changes in patient condition.

## 2015-10-20 NOTE — Progress Notes (Signed)
PT Cancellation Note  Patient Details Name: Nicole [name] MRN: [id number] DOB: 25-Mar-1976   Cancelled Treatment:    Reason Eval/Treat Not Completed: Patient at procedure or test/unavailable (pt in MRI.  Will check back as able.  Thanks.)   Irwin Brakeman F 10/20/2015, 12:16 PM  [place] [name],PT Acute Rehabilitation [phone] (pager)

## 2015-10-20 NOTE — Progress Notes (Addendum)
FPTS Interim Progress Note  S:Patient states she has worsening weakness, she states that she remains disorientated . She denies any SOB or palpations   O: BP 105/60 mmHg  Pulse 60  Temp(Src) 98 F (36.7 C) (Oral)  Resp 18  SpO2 99%  Cardiac: RRR, no murmurs  Lungs: CTAB, (patient requiring help to sit up)  Neuro: Continues to left eye ptosis. 2/5 upper and lower extremity weakness. However patient was able to raise hand to scratch nose which was discordant with the rest of her exam. Per Dr. Alease Frame  Patient with one beat of clonus   A/P: Generalized Weakness  - Pending MRI  - Pending EEG  - Will get TSH and sed rate - Consider myasthenia gravis, discuss with neuro - Orthostatic BP  - HA1C  - Will keep fall precautions   Nicole Cletis Media, MD 10/20/2015, 9:38 AM PGY-1, [place] [facility] pager [phone]

## 2015-10-20 NOTE — Procedures (Signed)
ELECTROENCEPHALOGRAM REPORT   Patient: Nicole [name]       Room #: 2E49 EEG No. ID: [id number] Age: 39 y.o.        Sex: female Referring Physician: Hensel Report Date:  10/20/2015        Interpreting Physician: Alexis Goodell  History: [name] is an 39 y.o. female with a history of seizure disorder and migraine presenting with usteady gait, headache and nausea.    Medications:  Scheduled: . sodium chloride   Intravenous STAT  . enoxaparin (LOVENOX) injection  40 mg Subcutaneous Q24H  . [START ON 10/21/2015] Influenza vac split quadrivalent PF  0.5 mL Intramuscular Tomorrow-1000  . levETIRAcetam  1,000 mg Oral BID  . levETIRAcetam  2,000 mg Oral QHS  . pantoprazole  80 mg Oral Daily  . topiramate  75 mg Oral BID    Conditions of Recording:  This is a 16 channel EEG carried out with the patient in the awake, drowsy and asleep states.  Description:  The waking background activity consists of a low voltage, symmetrical, fairly well organized, 10 Hz alpha activity, seen from the parieto-occipital and posterior temporal regions.  Low voltage fast activity, poorly organized, is seen anteriorly and is at times superimposed on more posterior regions.  A mixture of theta and alpha rhythms are seen from the central and temporal regions. The patient drowses with slowing to irregular, low voltage theta and beta activity.   The patient goes in to a light sleep briefly with symmetrical sleep spindles, vertex central sharp transients and irregular slow activity. Hyperventilation was not performed.  Intermittent photic stimulation was performed but failed to illicit any change in the tracing.    IMPRESSION: Normal electroencephalogram, awake, asleep and with activation procedures. There are no focal lateralizing or epileptiform features.   Alexis Goodell, MD [facility] [phone] 10/20/2015, 11:46 AM

## 2015-10-20 NOTE — Care Management Note (Signed)
Case Management Note  Patient Details  Name: Nicole [name] MRN: [id number] Date of Birth: Nov 25, 1976  Subjective/Objective:                  Date-10-20-15 Initial Assessment Spoke with patient at the bedside.  Introduced self as Tourist information centre manager and explained role in discharge planning and how to be reached.  Verified patient lives at home with spouse..  Verified patient anticipates to go home with spouse, at time of discharge.  Patient has DME cane walker  Bedside commode. Expressed potential need for no other DME.  Patient denied   needing help with their medication. Patient states she goes to [facility] and they have "reasonable" costs for medications. Patient is driven by husband MD appointments.  Verified patient has PCP Dr. Lamar Benes, family practice.   Plan: CM will continue to follow for discharge planning and [facility] resources.   Carles Collet RN BSN CM [phone]   Action/Plan:   Expected Discharge Date:  10/22/15               Expected Discharge Plan:  Home/Self Care  In-House Referral:     Discharge planning Services  CM Consult  Post Acute Care Choice:    Choice offered to:     DME Arranged:    DME Agency:     HH Arranged:    HH Agency:     Status of Service:  In process, will continue to follow  Medicare Important Message Given:    Date Medicare IM Given:    Medicare IM give by:    Date Additional Medicare IM Given:    Additional Medicare Important Message give by:     If discussed at [place] of Stay Meetings, dates discussed:    Additional Comments:  Carles Collet, RN 10/20/2015, 3:19 PM
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
## 2015-10-20 NOTE — ED Notes (Signed)
MD at bedside. 

## 2015-10-20 NOTE — Progress Notes (Signed)
EEG completed; results pending.    

## 2015-10-20 NOTE — Progress Notes (Signed)
Notified by MRI tech that patient had seizure during her MRI, she is now post ictal per MRI tech, family medicine teaching service MD Dallas Schimke paged.

## 2015-10-20 NOTE — Consult Note (Signed)
Admission H&P    Chief Complaint: Dizziness and unstable gait as well as generalized weakness.  HPI: Nicole [name] is an 39 y.o. female with a history of sickle cell trait, seizure disorder and migraine headaches presenting with those of disequilibrium and unstable gait of the last few days as well as headache and nausea. Patient reportedly had a seizure yesterday. She's also complaining of confusion and feeling disoriented. She currently takes Keppra 1000 mg in the morning and 2000 at bedtime. She is also on gabapentin 300 mg 3 times a day and Topamax 75 mg twice a day. She describes the dizziness as a feeling of turning or spinning which is induced by standing, and to a lesser extent by turning over in bed. She has not experienced symptoms of sinus congestion nor discomfort in her ears. CT scan of her head tonight showed no acute intracranial abnormality. Routine laboratory studies were unremarkable. Keppra level is pending.  Past Medical History  Diagnosis Date  . Anemia   . Asthma   . Blood transfusion 2011    r/t anemia  . Migraine   . Sickle cell trait (HCC)   . Seizures (HCC)     last sz Jan 2012  . Seizures (HCC)   . Fibroid   . Tooth abscess   . Sickle cell trait (HCC)   . Migraine     Past Surgical History  Procedure Laterality Date  . Tubal ligation Bilateral 2005    Family History  Problem Relation Age of Onset  . Anesthesia problems Neg Hx   . Hypertension Mother   . Diabetes Mother   . Cancer Mother     Colon, brain, 2 other  . Asthma Mother   . Clotting disorder Mother   . Heart disease Mother     1995, stents  . Stroke Mother     2012  . Diabetes Father   . Asthma Father   . Clotting disorder Father   . Sickle cell anemia Father   . Heart disease Father     2000, stents  . Hypertension Father   . Stroke Father     2007  . Diabetes Maternal Grandmother   . Asthma Sister   . Asthma Brother   . Asthma Paternal Grandmother    Social History:  reports  that she quit smoking about 18 months ago. She has never used smokeless tobacco. She reports that she does not drink alcohol or use illicit drugs.  Allergies:  Allergies  Allergen Reactions  . Shrimp [Shellfish Allergy] Anaphylaxis  . Tea Anaphylaxis    All teas  . Penicillins Swelling    Has patient had a PCN reaction causing immediate rash, facial/tongue/throat swelling, SOB or lightheadedness with hypotension: Yes Has patient had a PCN reaction causing severe rash involving mucus membranes or skin necrosis: Yes Has patient had a PCN reaction that required hospitalization Yes- ed visit Has patient had a PCN reaction occurring within the last 10 years: No-childhood allergy If all of the above answers are "NO", then may proceed with Cephalosporin use.   . Sulfa Antibiotics Rash   Medications: Patient's preadmission medications were reviewed by me.  ROS: History obtained from the patient  General ROS: negative for - chills, fatigue, fever, night sweats, weight gain or weight loss Psychological ROS: negative for - behavioral disorder, hallucinations, memory difficulties, mood swings or suicidal ideation Ophthalmic ROS: negative for - blurry vision, double vision, eye pain or loss of vision ENT ROS: negative for -   epistaxis, nasal discharge, oral lesions, sore throat, tinnitus or vertigo Allergy and Immunology ROS: negative for - hives or itchy/watery eyes Hematological and Lymphatic ROS: negative for - bleeding problems, bruising or swollen lymph nodes Endocrine ROS: negative for - galactorrhea, hair pattern changes, polydipsia/polyuria or temperature intolerance Respiratory ROS: negative for - cough, hemoptysis, shortness of breath or wheezing Cardiovascular ROS: negative for - chest pain, dyspnea on exertion, edema or irregular heartbeat Gastrointestinal ROS: negative for - abdominal pain, diarrhea, hematemesis, nausea/vomiting or stool incontinence Genito-Urinary ROS: negative for -  dysuria, hematuria, incontinence or urinary frequency/urgency Musculoskeletal ROS: negative for - joint swelling or muscular weakness Neurological ROS: as noted in HPI Dermatological ROS: negative for rash and skin lesion changes  Physical Examination: Blood pressure 117/76, pulse 64, temperature 97.9 F (36.6 C), temperature source Oral, resp. rate 25, SpO2 98 %.  HEENT-  Normocephalic, no lesions, without obvious abnormality.  Normal external eye and conjunctiva.  Normal TM's bilaterally.  Normal auditory canals and external ears. Normal external nose, mucus membranes and septum.  Normal pharynx. Neck supple with no masses, nodes, nodules or enlargement. Cardiovascular - regular rate and rhythm, S1, S2 normal, no murmur, click, rub or gallop Lungs - chest clear, no wheezing, rales, normal symmetric air entry Abdomen - soft, non-tender; bowel sounds normal; no masses,  no organomegaly Extremities - no joint deformities, effusion, or inflammation and no edema  Neurologic Examination: Mental Status: Drowsy, oriented, thought content appropriate.  Speech fluent without evidence of aphasia. Able to follow commands without difficulty. Cranial Nerves: II-Visual fields were normal. III/IV/VI-Pupils were equal and reacted normally to light. Extraocular movements were full and conjugate.    V/VII-no facial numbness and no facial weakness. VIII-normal. X-normal speech and symmetrical palatal movement. XI: trapezius strength/neck flexion strength normal bilaterally XII-midline tongue extension with normal strength. Motor: 5/5 bilaterally with normal tone and bulk; normal strength throughout with genuine effort on manual muscle testing. Sensory: Normal throughout. Deep Tendon Reflexes: 1+ and symmetric. Plantars: Mute bilaterally Cerebellar: Normal finger-to-nose testing. Carotid auscultation: Normal  Results for orders placed or performed during the hospital encounter of 10/19/15 (from the  past 48 hour(s))  CBG monitoring, ED     Status: Abnormal   Collection Time: 10/19/15  7:12 PM  Result Value Ref Range   Glucose-Capillary 106 (H) 65 - 99 mg/dL  Type and screen     Status: None   Collection Time: 10/19/15  9:17 PM  Result Value Ref Range   ABO/RH(D) AB POS    Antibody Screen NEG    Sample Expiration 10/22/2015   CBC with Differential/Platelet     Status: None   Collection Time: 10/19/15  9:52 PM  Result Value Ref Range   WBC 6.8 4.0 - 10.5 K/uL   RBC 4.51 3.87 - 5.11 MIL/uL   Hemoglobin 13.4 12.0 - 15.0 g/dL   HCT 39.6 36.0 - 46.0 %   MCV 87.8 78.0 - 100.0 fL   MCH 29.7 26.0 - 34.0 pg   MCHC 33.8 30.0 - 36.0 g/dL   RDW 13.0 11.5 - 15.5 %   Platelets 261 150 - 400 K/uL   Neutrophils Relative % 45 %   Neutro Abs 3.1 1.7 - 7.7 K/uL   Lymphocytes Relative 42 %   Lymphs Abs 2.8 0.7 - 4.0 K/uL   Monocytes Relative 6 %   Monocytes Absolute 0.4 0.1 - 1.0 K/uL   Eosinophils Relative 6 %   Eosinophils Absolute 0.4 0.0 - 0.7 K/uL     Basophils Relative 1 %   Basophils Absolute 0.1 0.0 - 0.1 K/uL  Comprehensive metabolic panel     Status: Abnormal   Collection Time: 10/19/15  9:52 PM  Result Value Ref Range   Sodium 138 135 - 145 mmol/L   Potassium 3.9 3.5 - 5.1 mmol/L   Chloride 111 101 - 111 mmol/L   CO2 18 (L) 22 - 32 mmol/L   Glucose, Bld 97 65 - 99 mg/dL   BUN 12 6 - 20 mg/dL   Creatinine, Ser 0.84 0.44 - 1.00 mg/dL   Calcium 9.3 8.9 - 10.3 mg/dL   Total Protein 6.5 6.5 - 8.1 g/dL   Albumin 3.7 3.5 - 5.0 g/dL   AST 15 15 - 41 U/L   ALT 14 14 - 54 U/L   Alkaline Phosphatase 79 38 - 126 U/L   Total Bilirubin 0.6 0.3 - 1.2 mg/dL   GFR calc non Af Amer >60 >60 mL/min   GFR calc Af Amer >60 >60 mL/min    Comment: (NOTE) The eGFR has been calculated using the CKD EPI equation. This calculation has not been validated in all clinical situations. eGFR's persistently <60 mL/min signify possible Chronic Kidney Disease.    Anion gap 9 5 - 15  I-Stat Beta hCG  blood, ED ([facility], [facility], [facility] only)     Status: None   Collection Time: 10/19/15  9:55 PM  Result Value Ref Range   I-stat hCG, quantitative <5.0 <5 mIU/mL   Comment 3            Comment:   GEST. AGE      CONC.  (mIU/mL)   <=1 WEEK        5 - 50     2 WEEKS       50 - 500     3 WEEKS       100 - 10,000     4 WEEKS     1,000 - 30,000        FEMALE AND NON-PREGNANT FEMALE:     LESS THAN 5 mIU/mL   I-stat troponin, ED     Status: None   Collection Time: 10/19/15  9:55 PM  Result Value Ref Range   Troponin i, poc 0.00 0.00 - 0.08 ng/mL   Comment 3            Comment: Due to the release kinetics of cTnI, a negative result within the first hours of the onset of symptoms does not rule out myocardial infarction with certainty. If myocardial infarction is still suspected, repeat the test at appropriate intervals.   I-stat chem 8, ed     Status: Abnormal   Collection Time: 10/19/15  9:57 PM  Result Value Ref Range   Sodium 141 135 - 145 mmol/L   Potassium 3.9 3.5 - 5.1 mmol/L   Chloride 109 101 - 111 mmol/L   BUN 13 6 - 20 mg/dL   Creatinine, Ser 0.80 0.44 - 1.00 mg/dL   Glucose, Bld 91 65 - 99 mg/dL   Calcium, Ion 1.26 (H) 1.12 - 1.23 mmol/L   TCO2 18 0 - 100 mmol/L   Hemoglobin 14.6 12.0 - 15.0 g/dL   HCT 43.0 36.0 - 46.0 %  Urinalysis, Routine w reflex microscopic (not at [facility])     Status: Abnormal   Collection Time: 10/19/15 11:59 PM  Result Value Ref Range   Color, Urine YELLOW YELLOW   APPearance CLOUDY (A) CLEAR   Specific Gravity, Urine   1.029 1.005 - 1.030   pH 5.5 5.0 - 8.0   Glucose, UA NEGATIVE NEGATIVE mg/dL   Hgb urine dipstick MODERATE (A) NEGATIVE   Bilirubin Urine NEGATIVE NEGATIVE   Ketones, ur NEGATIVE NEGATIVE mg/dL   Protein, ur NEGATIVE NEGATIVE mg/dL   Urobilinogen, UA 1.0 0.0 - 1.0 mg/dL   Nitrite NEGATIVE NEGATIVE   Leukocytes, UA SMALL (A) NEGATIVE  Urine rapid drug screen (hosp performed)     Status: None   Collection Time: 10/19/15 11:59 PM  Result Value  Ref Range   Opiates NONE DETECTED NONE DETECTED   Cocaine NONE DETECTED NONE DETECTED   Benzodiazepines NONE DETECTED NONE DETECTED   Amphetamines NONE DETECTED NONE DETECTED   Tetrahydrocannabinol NONE DETECTED NONE DETECTED   Barbiturates NONE DETECTED NONE DETECTED    Comment:        DRUG SCREEN FOR MEDICAL PURPOSES ONLY.  IF CONFIRMATION IS NEEDED FOR ANY PURPOSE, NOTIFY LAB WITHIN 5 DAYS.        LOWEST DETECTABLE LIMITS FOR URINE DRUG SCREEN Drug Class       Cutoff (ng/mL) Amphetamine      1000 Barbiturate      200 Benzodiazepine   200 Tricyclics       300 Opiates          300 Cocaine          300 THC              50   Urine microscopic-add on     Status: Abnormal   Collection Time: 10/19/15 11:59 PM  Result Value Ref Range   Squamous Epithelial / LPF MANY (A) RARE   WBC, UA 3-6 <3 WBC/hpf   RBC / HPF 3-6 <3 RBC/hpf   Bacteria, UA MANY (A) RARE   Crystals CA OXALATE CRYSTALS (A) NEGATIVE  POC urine preg, ED (not at [facility])     Status: None   Collection Time: 10/20/15 12:10 AM  Result Value Ref Range   Preg Test, Ur NEGATIVE NEGATIVE    Comment:        THE SENSITIVITY OF THIS METHODOLOGY IS >24 mIU/mL    Ct Head Wo Contrast  10/19/2015  CLINICAL DATA:  Confusion and weakness tonight. EXAM: CT HEAD WITHOUT CONTRAST TECHNIQUE: Contiguous axial images were obtained from the base of the skull through the vertex without intravenous contrast. COMPARISON:  Multiple prior head CTs.  The most recent is 01/24/2015 FINDINGS: The ventricles are normal in size and configuration. No extra-axial fluid collections are identified. The gray-white differentiation is normal. No CT findings for acute intracranial process such as hemorrhage or infarction. No mass lesions. Stable dural calcification posteriorly. The brainstem and cerebellum are grossly normal. The bony structures are intact. The paranasal sinuses and mastoid air cells are clear except for scattered ethmoid sinus disease. The  globes are intact. IMPRESSION: No acute intracranial findings or mass lesion. No change since prior head CTs. Scattered ethmoid sinus disease. Electronically Signed   By: P.  Gallerani M.D.   On: 10/19/2015 23:00    Assessment/Plan 39-year-old lady is entering with symptoms consistent with vertigo, most likely manifestations of benign positional vertigo of labyrinth origin. Patient has no objective focal neurological deficits, including no demonstrable muscle weakness.  Recommendations: 1. No changes in current medications for seizure management (Keppra, Topamax and Neurontin). 2. EEG in a.m., routine study 3. MRI of the brain to rule out possible acute brainstem or cerebellar lesion as etiology for patient's vertigo 4. Physical therapy consultation   for vestibular disorder and unstable gait  We will continue to follow this patient with you.  C.R. Nicole Kindred, [place] [facility] [phone]  10/20/2015, 1:36 AM
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
## 2015-10-21 DIAGNOSIS — R42 Dizziness and giddiness: Secondary | ICD-10-CM | POA: Insufficient documentation

## 2015-10-21 DIAGNOSIS — F4321 Adjustment disorder with depressed mood: Secondary | ICD-10-CM | POA: Diagnosis present

## 2015-10-21 LAB — BASIC METABOLIC PANEL
Anion gap: 8 (ref 5–15)
BUN: 10 mg/dL (ref 6–20)
CHLORIDE: 111 mmol/L (ref 101–111)
CO2: 18 mmol/L — AB (ref 22–32)
CREATININE: 0.88 mg/dL (ref 0.44–1.00)
Calcium: 8.8 mg/dL — ABNORMAL LOW (ref 8.9–10.3)
GFR calc Af Amer: >60 mL/min (ref >60)
GFR calc non Af Amer: >60 mL/min (ref >60)
GLUCOSE: 85 mg/dL (ref 65–99)
POTASSIUM: 3.7 mmol/L (ref 3.5–5.1)
SODIUM: 137 mmol/L (ref 135–145)

## 2015-10-21 LAB — URINALYSIS W MICROSCOPIC (NOT AT ARMC)
BILIRUBIN URINE: NEGATIVE
GLUCOSE, UA: NEGATIVE mg/dL
Ketones, ur: NEGATIVE mg/dL
Leukocytes, UA: NEGATIVE
Nitrite: NEGATIVE
PH: 7 (ref 5.0–8.0)
Protein, ur: NEGATIVE mg/dL
SPECIFIC GRAVITY, URINE: 1.013 (ref 1.005–1.030)
UROBILINOGEN UA: 1 mg/dL (ref 0.0–1.0)

## 2015-10-21 LAB — HEMOGLOBIN A1C
Hgb A1c MFr Bld: 5.6 % (ref 4.8–5.6)
MEAN PLASMA GLUCOSE: 114 mg/dL

## 2015-10-21 MED ORDER — LEVETIRACETAM 500 MG PO TABS
1000.0000 mg | ORAL_TABLET | Freq: Two times a day (BID) | ORAL | Status: DC
  Administered 2015-10-21 – 2015-10-23 (×5): 1000 mg via ORAL
  Filled 2015-10-21 (×5): qty 2

## 2015-10-21 MED ORDER — LORATADINE 10 MG PO TABS
10.0000 mg | ORAL_TABLET | Freq: Every day | ORAL | Status: DC
  Administered 2015-10-21 – 2015-10-23 (×3): 10 mg via ORAL
  Filled 2015-10-21 (×3): qty 1

## 2015-10-21 MED ORDER — TOPIRAMATE 25 MG PO TABS
75.0000 mg | ORAL_TABLET | Freq: Every day | ORAL | Status: DC
  Administered 2015-10-22 – 2015-10-23 (×2): 75 mg via ORAL
  Filled 2015-10-21 (×2): qty 3

## 2015-10-21 MED ORDER — IBUPROFEN 400 MG PO TABS
400.0000 mg | ORAL_TABLET | Freq: Four times a day (QID) | ORAL | Status: DC | PRN
  Administered 2015-10-21 – 2015-10-22 (×3): 400 mg via ORAL
  Filled 2015-10-21 (×3): qty 1

## 2015-10-21 NOTE — Evaluation (Signed)
Physical Therapy Evaluation Patient Details Name: Nicole [name] MRN: [id number] DOB: Sep 23, 1976 Today's Date: 10/21/2015   History of Present Illness  Pt is a 39 yo female admitted for seizure.  Pt with seizure d/o, anemia, asthma and sickle cell trait.  Clinical Impression  Pt admitted with above diagnosis and presents to PT with functional limitations due to deficits listed below (See PT problem list). Pt needs skilled PT to maximize independence and safety to allow discharge to home with family. Question if there is a psych component to pt's decrease in functional mobility. Pt didn't c/o any dizziness throughout assessment. When asked if she was still having any dizziness she stated she was having some. When asked when this bothered her most she said it was when she sat down. Had pt perform head turns and pt c/o incr dizziness. Will have another PT perform more extensive vestibular assessment.     Follow Up Recommendations Home health PT;Supervision for mobility/OOB    Equipment Recommendations  None recommended by PT    Recommendations for Other Services       Precautions / Restrictions Precautions Precautions: Fall Restrictions Weight Bearing Restrictions: No      Mobility  Bed Mobility Overal bed mobility: Needs Assistance Bed Mobility: Supine to Sit     Supine to sit: Mod assist     General bed mobility comments: Pt would not slide legs over in bed to initiate getting up. After therapist began to assist pt did help some but required a great amount of assist to get to the side of the ed.  Transfers Overall transfer level: Needs assistance Equipment used: Rolling walker (2 wheeled) Transfers: Sit to/from Stand Sit to Stand: Min assist;+2 physical assistance Stand pivot transfers: Min assist       General transfer comment: assist to bring hips up from low recliner  Ambulation/Gait Ambulation/Gait assistance: Min guard;+2 safety/equipment Ambulation Distance  (Feet): 50 Feet Assistive device: Rolling walker (2 wheeled) Gait Pattern/deviations: Step-through pattern;Decreased step length - right;Decreased step length - left Gait velocity: decr Gait velocity interpretation: Below normal speed for age/gender General Gait Details: Pt trembling but able to perform. No loss of balance.  Stairs            Wheelchair Mobility    Modified Rankin (Stroke Patients Only)       Balance Overall balance assessment: Needs assistance Sitting-balance support: No upper extremity supported;Feet supported Sitting balance-Leahy Scale: Fair Sitting balance - Comments: Pt self limiting   Standing balance support: Bilateral upper extremity supported Standing balance-Leahy Scale: Poor Standing balance comment: Pt requiring support of walker                             Pertinent Vitals/Pain Pain Assessment: 0-10 Pain Score: 8  Pain Location: head Pain Descriptors / Indicators: Aching Pain Intervention(s): Limited activity within patient's tolerance;Monitored during session;Premedicated before session    Home Living Family/patient expects to be discharged to:: Private residence Living Arrangements: Spouse/significant other;Children Available Help at Discharge: Family;Available PRN/intermittently Type of Home: House Home Access: Stairs to enter Entrance Stairs-Rails: None Entrance Stairs-Number of Steps: 1 small step Home Layout: One level Home Equipment: Bedside commode;Walker - 2 wheels;Cane - single point Additional Comments: Pt used to work at [employer] but has not been able to work for some time now.    Prior Function Level of Independence: Independent with assistive device(s)         Comments:  She stated on bad days she would use walker or cane and will sit in shower and on good days she is fairly independent.  Husband is a truck driver so not home a lot.     Hand Dominance   Dominant Hand: Right     Extremity/Trunk Assessment   Upper Extremity Assessment: Defer to OT evaluation RUE Deficits / Details: During manual muscle testing Shoulder 2/5, elbow 3-/5, hand 3/5.  During funtional activity pt demonstrated sigificantly more strength...WFL. general weakness.     LUE Deficits / Details: With manaual muscle test:  Shoulder 2+/5, elbow 3+5, grip 3+/5.  During functional activity, pt demonstrated more strength.l   Lower Extremity Assessment: RLE deficits/detail;LLE deficits/detail RLE Deficits / Details: No formal manual muscle test performed due to questionable effort with formal testing. Functionally pt able to stand and walk without buckling of knees and is clearing foot during swing phase. LLE Deficits / Details: No formal manual muscle test performed due to questionable effort with formal testing. Functionally pt able to stand and walk without buckling of knees and is clearing foot during swing phase.  Cervical / Trunk Assessment: Normal  Communication   Communication: No difficulties  Cognition Arousal/Alertness: Awake/alert Behavior During Therapy: Flat affect Overall Cognitive Status: Impaired/Different from baseline Area of Impairment: Orientation;Problem solving;Safety/judgement;Attention;Memory Orientation Level: Disoriented to;Time;Place Current Attention Level: Sustained     Safety/Judgement: Decreased awareness of safety   Problem Solving: Slow processing;Decreased initiation General Comments: Did not ask specific orientation questions but pt talking about the election earlier this week and about her son being out of school tomorrow.    General Comments General comments (skin integrity, edema, etc.): Pt has significant limitations with adls..  Difficult to assess how much is self limiting vs. true weakness.  Feel pt will need assist to go home at this level.    Exercises        Assessment/Plan    PT Assessment Patient needs continued PT services  PT Diagnosis  Difficulty walking;Generalized weakness   PT Problem List Decreased strength;Decreased activity tolerance;Decreased balance;Decreased mobility  PT Treatment Interventions DME instruction;Gait training;Functional mobility training;Therapeutic activities;Therapeutic exercise;Balance training;Patient/family education   PT Goals (Current goals can be found in the Care Plan section) Acute Rehab PT Goals Patient Stated Goal: to get stonger and walk better. PT Goal Formulation: With patient Time For Goal Achievement: 10/28/15 Potential to Achieve Goals: Good    Frequency Min 3X/week   Barriers to discharge        Co-evaluation               End of Session Equipment Utilized During Treatment: Gait belt Activity Tolerance: Patient limited by fatigue Patient left: in chair;with call bell/phone within reach;with family/visitor present Nurse Communication: Mobility status    Functional Assessment Tool Used: clinical judgement Functional Limitation: Mobility: Walking and moving around Mobility: Walking and Moving Around Current Status [id number]): At least 20 percent but less than 40 percent impaired, limited or restricted Mobility: Walking and Moving Around Goal Status [phone]): At least 1 percent but less than 20 percent impaired, limited or restricted    Time: [id number] PT Time Calculation (min) (ACUTE ONLY): 23 min   Charges:   PT Evaluation $Initial PT Evaluation Tier I: 1 Procedure PT Treatments $Gait Training: 8-22 mins   PT G Codes:   PT G-Codes **NOT FOR INPATIENT CLASS** Functional Assessment Tool Used: clinical judgement Functional Limitation: Mobility: Walking and moving around Mobility: Walking and Moving Around Current Status [id number]): At least 20  percent but less than 40 percent impaired, limited or restricted Mobility: Walking and Moving Around Goal Status [phone]): At least 1 percent but less than 20 percent impaired, limited or restricted    [facility] 10/21/2015,  12:56 PM [facility] PT [phone]

## 2015-10-21 NOTE — Progress Notes (Signed)
Subjective: patient is VERY drowsy and having a hard time keeping eyes open.  She notes she is talking and thinking much slower than usual. No further seizures. Tells [place] she often stops breathing at night and husband will wake her up in the night to get there to breath. She is on Topamax for her migraines and this has recently been increased.  She states on lower doses she has horrible HA.  She was on 4000 mg of Keppra but unsure why such a high dose. Previous hospitalization she was on 750 mg BID.  It is unclear when or why this was increased.   Objective: Current vital signs: BP 113/78 mmHg  Pulse 73  Temp(Src) 97.8 F (36.6 C) (Oral)  Resp 18  SpO2 95% Vital signs in last 24 hours: Temp:  [97.8 F (36.6 C)] 97.8 F (36.6 C) (11/10 0700) Pulse Rate:  [73-76] 73 (11/10 0700) Resp:  [18] 18 (11/10 0700) BP: (113-120)/(63-78) 113/78 mmHg (11/10 0700) SpO2:  [95 %] 95 % (11/10 0700)  Intake/Output from previous day: 11/09 0701 - 11/10 0700 In: 0  Out: 1000 [Urine:1000] Intake/Output this shift:   Nutritional status: Diet regular Room service appropriate?: Yes; Fluid consistency:: Thin  Neurologic Exam: General: NAD Mental Status: lethargic, oriented to hospital but thinks it is 2015 and october, thought content appropriate.  Speech fluent without evidence of aphasia.  Able to follow 3 step commands without difficulty. Cranial Nerves: II:  Visual fields grossly normal, pupils equal, round, reactive to light and accommodation III,IV, VI: ptosis not present, extra-ocular motions intact bilaterally V,VII: smile symmetric, facial light touch sensation normal bilaterally VIII: hearing normal bilaterally IX,X: uvula rises symmetrically XI: bilateral shoulder shrug XII: midline tongue extension without atrophy or fasciculations  Motor: Right : Upper extremity   5/5    Left:     Upper extremity   5/5  Lower extremity   5/5     Lower extremity   5/5 Tone and bulk:normal tone  throughout; no atrophy noted Sensory: Pinprick and light touch intact throughout, bilaterally Deep Tendon Reflexes:  Right: Upper Extremity   Left: Upper extremity   biceps (C-5 to C-6) 2/4   biceps (C-5 to C-6) 2/4 tricep (C7) 2/4    triceps (C7) 2/4 Brachioradialis (C6) 2/4  Brachioradialis (C6) 2/4  Lower Extremity Lower Extremity  quadriceps (L-2 to L-4) 2/4   quadriceps (L-2 to L-4) 2/4 Achilles (S1) 2/4   Achilles (S1) 2/4  Plantars: Right: downgoing   Left: downgoing    Lab Results: Basic Metabolic Panel:  Recent Labs Lab 10/19/15 2152 10/19/15 2157 10/21/15 0622  NA 138 141 137  K 3.9 3.9 3.7  CL 111 109 111  CO2 18*  --  18*  GLUCOSE 97 91 85  BUN 12 13 10   CREATININE 0.84 0.80 0.88  CALCIUM 9.3  --  8.8*    Liver Function Tests:  Recent Labs Lab 10/19/15 2152  AST 15  ALT 14  ALKPHOS 79  BILITOT 0.6  PROT 6.5  ALBUMIN 3.7   No results for input(s): LIPASE, AMYLASE in the last 168 hours. No results for input(s): AMMONIA in the last 168 hours.  CBC:  Recent Labs Lab 10/19/15 2152 10/19/15 2157  WBC 6.8  --   NEUTROABS 3.1  --   HGB 13.4 14.6  HCT 39.6 43.0  MCV 87.8  --   PLT 261  --     Cardiac Enzymes: No results for input(s): CKTOTAL, CKMB, CKMBINDEX, TROPONINI in  the last 168 hours.  Lipid Panel: No results for input(s): CHOL, TRIG, HDL, CHOLHDL, VLDL, LDLCALC in the last 168 hours.  CBG:  Recent Labs Lab 10/19/15 1912  GLUCAP 106*    Microbiology: Results for orders placed or performed during the hospital encounter of 10/19/15  MRSA PCR Screening     Status: None   Collection Time: 10/20/15  4:23 AM  Result Value Ref Range Status   MRSA by PCR NEGATIVE NEGATIVE Final    Comment:        The GeneXpert MRSA Assay (FDA approved for NASAL specimens only), is one component of a comprehensive MRSA colonization surveillance program. It is not intended to diagnose MRSA infection nor to guide or monitor treatment  for MRSA infections.     Coagulation Studies: No results for input(s): LABPROT, INR in the last 72 hours.  Imaging: Ct Head Wo Contrast  10/19/2015  CLINICAL DATA:  Confusion and weakness tonight. EXAM: CT HEAD WITHOUT CONTRAST TECHNIQUE: Contiguous axial images were obtained from the base of the skull through the vertex without intravenous contrast. COMPARISON:  Multiple prior head CTs.  The most recent is 01/24/2015 FINDINGS: The ventricles are normal in size and configuration. No extra-axial fluid collections are identified. The gray-white differentiation is normal. No CT findings for acute intracranial process such as hemorrhage or infarction. No mass lesions. Stable dural calcification posteriorly. The brainstem and cerebellum are grossly normal. The bony structures are intact. The paranasal sinuses and mastoid air cells are clear except for scattered ethmoid sinus disease. The globes are intact. IMPRESSION: No acute intracranial findings or mass lesion. No change since prior head CTs. Scattered ethmoid sinus disease. Electronically Signed   By: Marijo Sanes M.D.   On: 10/19/2015 23:00   Mr Brain Wo Contrast  10/20/2015  CLINICAL DATA:  Seizure disorder migraine headaches. Unsteady gait. Headache and nausea. Per the technologist, the patient had a seizure while in the MRI. The patient's nurse was notified. EXAM: MRI HEAD WITHOUT CONTRAST TECHNIQUE: Multiplanar, multiecho pulse sequences of the brain and surrounding structures were obtained without intravenous contrast. COMPARISON:  CT head without contrast 10/19/2015 FINDINGS: No acute infarct, hemorrhage, or mass lesion is present. The ventricles are of normal size. No significant extraaxial fluid collection is present. Dedicated imaging of the temporal lobes demonstrate symmetric size and signal of the hippocampal structures bilaterally. Flow is present in the major intracranial arteries. The globes and orbits are intact. Mild mucosal thickening  is present within the maxillary sinuses, mild mucosal thickening is present in the anterior paranasal sinuses, left greater than right. The sphenoid sinuses are clear. There is fluid in the mastoid air cells bilaterally. No obstructing nasopharyngeal lesion is present. The skullbase is within normal limits. Midline structures are unremarkable. IMPRESSION: 1. Normal MRI the brain. 2. No acute or focal lesion to explain seizures. 3. Mild diffuse sinus disease, left greater than right. 4. Bilateral mastoid effusions. Electronically Signed   By: San Morelle M.D.   On: 10/20/2015 13:12    Medications:  Scheduled: . enoxaparin (LOVENOX) injection  40 mg Subcutaneous Q24H  . lacosamide  100 mg Oral BID  . levETIRAcetam  1,000 mg Oral BID  . pantoprazole  80 mg Oral Daily  . topiramate  75 mg Oral BID    Assessment/Plan: 39 YO female with sensation of vertigo. Currently no vertigo but very lethargic--much like over medicated. MRI and EEG were normal. At this time the symptoms could very well be SE of AED medications.  Topamax can make one very lethargic and slow but since she has HA on lower doses would defer changing dose to her primary MD. Will change AED Keppra to 1 Gram BID and keep Vimpat 100 mg BID.  High doses of Keppra can also cause lethargy.   Recommend: 1) Maintain current vimpat dose and lower to 1000 mg BID for now 2) Sleep study as out patient 3) primary MD to make changes in Topamax.  4) I do not see any out patient neurology notes.  She will need out patient neurology F/U in 2 weeks.  Please make appoint for her.    Neurology S/O   Etta Quill PA-C [facility] [phone]  10/21/2015, 10:28 AM Patient seen and examined together with physician assistant and I concur with the assessment and plan.  Dorian Pod, MD

## 2015-10-21 NOTE — Consult Note (Signed)
[facility] Consult   Reason for Consult:  Evaluation for depression Referring Physician:  [place] Patient Identification: Nicole [name] MRN:  [id number] Principal Diagnosis: Seizure [facility]) with Adjustment disorder with depressed mood Diagnosis:   Patient Active Problem List   Diagnosis Date Noted  . Dizziness [R42]   . Weakness [R53.1] 11/18/2014  . OSA (obstructive sleep apnea) [G47.33] 10/21/2014  . Acute rhinosinusitis [J01.90] 10/09/2014  . Leg pain, lateral [M79.606] 09/04/2014  . Migraines [G43.909] 08/13/2014  . Seizure ([place]) [R56.9] 06/04/2014  . Leiomyoma of uterus, unspecified [D25.9] 02/11/2014  . GERD (gastroesophageal reflux disease) [K21.9] 01/28/2014  . Candidiasis of vulva and vagina [B37.3] 01/28/2014  . Unspecified symptom associated with female genital organs [N94.9] 01/28/2014  . Chest pain [R07.9] 01/20/2014  . Acute pericarditis, unspecified [I30.9] 01/20/2014    Total Time spent with patient: 20 minutes  Subjective:   Nicole [name] is a 39 y.o. female patient admitted with reports of longstanding seizure disorders, poorly managed. Pt is alert/oriented x4, calm, cooperative, and appropriate, although speaks softly. Denies suicidal/homicidal ideation and psychosis and does not appear to be responding to internal stimuli. Pt denies any hx of self-injurious behavior. Reports longstanding depression secondary to concerns about her seizure disorder. Pt reports that she is sometimes forgetful and that this has worsened concomitantly with the progression of her epilepsy. Pt states that she feels her illness has prevented her from being a good parent and interfered with her ability to provider for her family members. Pt would like outpatient psychiatric counseling and has information for [facility], but [facility] does not provide depressive psychotherapy. SW consult put in (CM unavailable) to assist pt with this referral. Regarding medications, pt is reluctant to add  more medication but will consider it outpatient.   HPI:  I have reviewed Attending provider's H&P and concur with basic elements, modified as below:  "ANIYLAH AVANS is a 39 y.o. female presenting with possible seizure. PMH is significant for Anemia, Asthma, Seizures, Sickle Cell trait .Patient with hx of seizures (last in 2012), currently on Keppra, has been compliant with this medication. Atypical migraine also a consideration given her history of migraines. BPPV/labrynthitis also possible given severe paroxysmal dizziness but no N/V. Doubt central causes of vertigo given time course. On presentation included increased sedation and disorientation as well as 4/5 diffuse weakness throughout on physical exam. Per patient neighbor witnessed patient's seizure, however do not have any details of the event from patient or her husband. CT head was negative for any acute process. UDS was negative for any drugs. UA positive small leukocytes, however many squamous epithelial cells seen as well. Electrolytes were WNL"   Hospitalist group and Neurology have been following the pt thus far and requested psychiatric consult regarding depressive symptoms. Pt's presentation is more consistent with adjustment disorder with depressed mood with contributory factors being illness and financial strain. See above consult.   Past Psychiatric History: Denies any  Risk to Self: Is patient at risk for suicide?: No Risk to Others:   Prior Inpatient Therapy:   Prior Outpatient Therapy:    Past Medical History:  Past Medical History  Diagnosis Date  . Anemia   . Asthma   . Blood transfusion 2011    r/t anemia  . Migraine   . Sickle cell trait ([place])   . Seizures [facility])     last sz Jan 2012  . Seizures ([place])   . Fibroid   . Tooth abscess   . Sickle cell  trait ([place])   . Migraine     Past Surgical History  Procedure Laterality Date  . Tubal ligation Bilateral 2005   Family History:  Family History  Problem  Relation Age of Onset  . Anesthesia problems Neg Hx   . Hypertension Mother   . Diabetes Mother   . Cancer Mother     Colon, brain, 2 other  . Asthma Mother   . Clotting disorder Mother   . Heart disease Mother     66, stents  . Stroke Mother     2012  . Diabetes Father   . Asthma Father   . Clotting disorder Father   . Sickle cell anemia Father   . Heart disease Father     2000, stents  . Hypertension Father   . Stroke Father     2007  . Diabetes Maternal Grandmother   . Asthma Sister   . Asthma Brother   . Asthma Paternal Grandmother    Family Psychiatric  History: Family hx of MDD per pt Social History:  History  Alcohol Use No     History  Drug Use No    Social History   Social History  . Marital Status: Married    Spouse Name: N/A  . Number of Children: N/A  . Years of Education: N/A   Social History Main Topics  . Smoking status: Former Smoker    Quit date: 03/27/2014  . Smokeless tobacco: Never Used  . Alcohol Use: No  . Drug Use: No  . Sexual Activity:    Partners: Male    Birth Control/ Protection: Condom, Injection, Surgical   Other Topics Concern  . None   Social History Narrative   Additional Social History:                          Allergies:   Allergies  Allergen Reactions  . Shrimp [Shellfish Allergy] Anaphylaxis  . Tea Anaphylaxis    All teas  . Penicillins Swelling    Has patient had a PCN reaction causing immediate rash, facial/tongue/throat swelling, SOB or lightheadedness with hypotension: Yes Has patient had a PCN reaction causing severe rash involving mucus membranes or skin necrosis: Yes Has patient had a PCN reaction that required hospitalization Yes- ed visit Has patient had a PCN reaction occurring within the last 10 years: No-childhood allergy If all of the above answers are "NO", then may proceed with Cephalosporin use.   . Sulfa Antibiotics Rash    Labs:  Results for orders placed or performed during  the hospital encounter of 10/19/15 (from the past 48 hour(s))  CBG monitoring, ED     Status: Abnormal   Collection Time: 10/19/15  7:12 PM  Result Value Ref Range   Glucose-Capillary 106 (H) 65 - 99 mg/dL  Type and screen     Status: None   Collection Time: 10/19/15  9:17 PM  Result Value Ref Range   ABO/RH(D) AB POS    Antibody Screen NEG    Sample Expiration 10/22/2015   CBC with Differential/Platelet     Status: None   Collection Time: 10/19/15  9:52 PM  Result Value Ref Range   WBC 6.8 4.0 - 10.5 K/uL   RBC 4.51 3.87 - 5.11 MIL/uL   Hemoglobin 13.4 12.0 - 15.0 g/dL   HCT 39.6 36.0 - 46.0 %   MCV 87.8 78.0 - 100.0 fL   MCH 29.7 26.0 - 34.0 pg  MCHC 33.8 30.0 - 36.0 g/dL   RDW 13.0 11.5 - 15.5 %   Platelets 261 150 - 400 K/uL   Neutrophils Relative % 45 %   Neutro Abs 3.1 1.7 - 7.7 K/uL   Lymphocytes Relative 42 %   Lymphs Abs 2.8 0.7 - 4.0 K/uL   Monocytes Relative 6 %   Monocytes Absolute 0.4 0.1 - 1.0 K/uL   Eosinophils Relative 6 %   Eosinophils Absolute 0.4 0.0 - 0.7 K/uL   Basophils Relative 1 %   Basophils Absolute 0.1 0.0 - 0.1 K/uL  Comprehensive metabolic panel     Status: Abnormal   Collection Time: 10/19/15  9:52 PM  Result Value Ref Range   Sodium 138 135 - 145 mmol/L   Potassium 3.9 3.5 - 5.1 mmol/L   Chloride 111 101 - 111 mmol/L   CO2 18 (L) 22 - 32 mmol/L   Glucose, Bld 97 65 - 99 mg/dL   BUN 12 6 - 20 mg/dL   Creatinine, Ser 0.84 0.44 - 1.00 mg/dL   Calcium 9.3 8.9 - 10.3 mg/dL   Total Protein 6.5 6.5 - 8.1 g/dL   Albumin 3.7 3.5 - 5.0 g/dL   AST 15 15 - 41 U/L   ALT 14 14 - 54 U/L   Alkaline Phosphatase 79 38 - 126 U/L   Total Bilirubin 0.6 0.3 - 1.2 mg/dL   GFR calc non Af Amer >60 >60 mL/min   GFR calc Af Amer >60 >60 mL/min    Comment: (NOTE) The eGFR has been calculated using the CKD EPI equation. This calculation has not been validated in all clinical situations. eGFR's persistently <60 mL/min signify possible Chronic  Kidney Disease.    Anion gap 9 5 - 15  I-Stat Beta hCG blood, ED ([facility], [facility], [facility] only)     Status: None   Collection Time: 10/19/15  9:55 PM  Result Value Ref Range   I-stat hCG, quantitative <5.0 <5 mIU/mL   Comment 3            Comment:   GEST. AGE      CONC.  (mIU/mL)   <=1 WEEK        5 - 50     2 WEEKS       50 - 500     3 WEEKS       100 - 10,000     4 WEEKS     1,000 - 30,000        FEMALE AND NON-PREGNANT FEMALE:     LESS THAN 5 mIU/mL   I-stat troponin, ED     Status: None   Collection Time: 10/19/15  9:55 PM  Result Value Ref Range   Troponin i, poc 0.00 0.00 - 0.08 ng/mL   Comment 3            Comment: Due to the release kinetics of cTnI, a negative result within the first hours of the onset of symptoms does not rule out myocardial infarction with certainty. If myocardial infarction is still suspected, repeat the test at appropriate intervals.   I-stat chem 8, ed     Status: Abnormal   Collection Time: 10/19/15  9:57 PM  Result Value Ref Range   Sodium 141 135 - 145 mmol/L   Potassium 3.9 3.5 - 5.1 mmol/L   Chloride 109 101 - 111 mmol/L   BUN 13 6 - 20 mg/dL   Creatinine, Ser 0.80 0.44 - 1.00 mg/dL  Glucose, Bld 91 65 - 99 mg/dL   Calcium, Ion 1.26 (H) 1.12 - 1.23 mmol/L   TCO2 18 0 - 100 mmol/L   Hemoglobin 14.6 12.0 - 15.0 g/dL   HCT 43.0 36.0 - 46.0 %  Urinalysis, Routine w reflex microscopic (not at [facility])     Status: Abnormal   Collection Time: 10/19/15 11:59 PM  Result Value Ref Range   Color, Urine YELLOW YELLOW   APPearance CLOUDY (A) CLEAR   Specific Gravity, Urine 1.029 1.005 - 1.030   pH 5.5 5.0 - 8.0   Glucose, UA NEGATIVE NEGATIVE mg/dL   Hgb urine dipstick MODERATE (A) NEGATIVE   Bilirubin Urine NEGATIVE NEGATIVE   Ketones, ur NEGATIVE NEGATIVE mg/dL   Protein, ur NEGATIVE NEGATIVE mg/dL   Urobilinogen, UA 1.0 0.0 - 1.0 mg/dL   Nitrite NEGATIVE NEGATIVE   Leukocytes, UA SMALL (A) NEGATIVE  Urine rapid drug screen (hosp performed)      Status: None   Collection Time: 10/19/15 11:59 PM  Result Value Ref Range   Opiates NONE DETECTED NONE DETECTED   Cocaine NONE DETECTED NONE DETECTED   Benzodiazepines NONE DETECTED NONE DETECTED   Amphetamines NONE DETECTED NONE DETECTED   Tetrahydrocannabinol NONE DETECTED NONE DETECTED   Barbiturates NONE DETECTED NONE DETECTED    Comment:        DRUG SCREEN FOR MEDICAL PURPOSES ONLY.  IF CONFIRMATION IS NEEDED FOR ANY PURPOSE, NOTIFY LAB WITHIN 5 DAYS.        LOWEST DETECTABLE LIMITS FOR URINE DRUG SCREEN Drug Class       Cutoff (ng/mL) Amphetamine      1000 Barbiturate      200 Benzodiazepine   846 Tricyclics       962 Opiates          300 Cocaine          300 THC              50   Urine microscopic-add on     Status: Abnormal   Collection Time: 10/19/15 11:59 PM  Result Value Ref Range   Squamous Epithelial / LPF MANY (A) RARE   WBC, UA 3-6 <3 WBC/hpf   RBC / HPF 3-6 <3 RBC/hpf   Bacteria, UA MANY (A) RARE   Crystals CA OXALATE CRYSTALS (A) NEGATIVE  POC urine preg, ED (not at [facility])     Status: None   Collection Time: 10/20/15 12:10 AM  Result Value Ref Range   Preg Test, Ur NEGATIVE NEGATIVE    Comment:        THE SENSITIVITY OF THIS METHODOLOGY IS >24 mIU/mL   Hemoglobin A1c     Status: None   Collection Time: 10/20/15  2:30 AM  Result Value Ref Range   Hgb A1c MFr Bld 5.6 4.8 - 5.6 %    Comment: (NOTE)         Pre-diabetes: 5.7 - 6.4         Diabetes: >6.4         Glycemic control for adults with diabetes: <7.0    Mean Plasma Glucose 114 mg/dL    Comment: (NOTE) Performed At: [facility] [place], [place] [id number] Lindon Romp MD [id number]   MRSA PCR Screening     Status: None   Collection Time: 10/20/15  4:23 AM  Result Value Ref Range   MRSA by PCR NEGATIVE NEGATIVE    Comment:        The GeneXpert  MRSA Assay (FDA approved for NASAL specimens only), is one component of a comprehensive MRSA  colonization surveillance program. It is not intended to diagnose MRSA infection nor to guide or monitor treatment for MRSA infections.   TSH     Status: None   Collection Time: 10/20/15  2:46 PM  Result Value Ref Range   TSH 1.980 0.350 - 4.500 uIU/mL  Sedimentation rate     Status: None   Collection Time: 10/20/15  2:46 PM  Result Value Ref Range   Sed Rate 11 0 - 22 mm/hr  Urinalysis with microscopic     Status: Abnormal   Collection Time: 10/21/15  4:18 AM  Result Value Ref Range   Color, Urine YELLOW YELLOW   APPearance CLOUDY (A) CLEAR   Specific Gravity, Urine 1.013 1.005 - 1.030   pH 7.0 5.0 - 8.0   Glucose, UA NEGATIVE NEGATIVE mg/dL   Hgb urine dipstick LARGE (A) NEGATIVE   Bilirubin Urine NEGATIVE NEGATIVE   Ketones, ur NEGATIVE NEGATIVE mg/dL   Protein, ur NEGATIVE NEGATIVE mg/dL   Urobilinogen, UA 1.0 0.0 - 1.0 mg/dL   Nitrite NEGATIVE NEGATIVE   Leukocytes, UA NEGATIVE NEGATIVE   RBC / HPF 7-10 <3 RBC/hpf   Bacteria, UA RARE RARE   Squamous Epithelial / LPF FEW (A) RARE  Basic metabolic panel     Status: Abnormal   Collection Time: 10/21/15  6:22 AM  Result Value Ref Range   Sodium 137 135 - 145 mmol/L   Potassium 3.7 3.5 - 5.1 mmol/L   Chloride 111 101 - 111 mmol/L   CO2 18 (L) 22 - 32 mmol/L   Glucose, Bld 85 65 - 99 mg/dL   BUN 10 6 - 20 mg/dL   Creatinine, Ser 0.88 0.44 - 1.00 mg/dL   Calcium 8.8 (L) 8.9 - 10.3 mg/dL   GFR calc non Af Amer >60 >60 mL/min   GFR calc Af Amer >60 >60 mL/min    Comment: (NOTE) The eGFR has been calculated using the CKD EPI equation. This calculation has not been validated in all clinical situations. eGFR's persistently <60 mL/min signify possible Chronic Kidney Disease.    Anion gap 8 5 - 15    Current Facility-Administered Medications  Medication Dose Route Frequency Provider Last Rate Last Dose  . acetaminophen (TYLENOL) tablet 650 mg  650 mg Oral Q6H PRN Noemi Chapel, MD   650 mg at 10/21/15 0851  . albuterol  (PROVENTIL) (2.5 MG/3ML) 0.083% nebulizer solution 3 mL  3 mL Inhalation Q4H PRN Patrecia Pour, MD      . enoxaparin (LOVENOX) injection 40 mg  40 mg Subcutaneous Q24H Patrecia Pour, MD   40 mg at 10/21/15 1014  . ibuprofen (ADVIL,MOTRIN) tablet 400 mg  400 mg Oral Q6H PRN Sela Hua, MD   400 mg at 10/21/15 1426  . lacosamide (VIMPAT) tablet 100 mg  100 mg Oral BID Elberta Leatherwood, MD   100 mg at 10/21/15 1014  . levETIRAcetam (KEPPRA) tablet 1,000 mg  1,000 mg Oral BID Marliss Coots, PA-C   1,000 mg at 10/21/15 1202  . LORazepam (ATIVAN) injection 1-2 mg  1-2 mg Intravenous Q2H PRN Patrecia Pour, MD      . pantoprazole (PROTONIX) EC tablet 80 mg  80 mg Oral Daily Patrecia Pour, MD   80 mg at 10/21/15 1013  . [START ON 10/22/2015] topiramate (TOPAMAX) tablet 75 mg  75 mg Oral Daily Marylynn Pearson McKeag,  MD        Musculoskeletal: Strength & Muscle Tone: decreased Gait & Station: Did not observe; pt lying in bed Patient leans: Backward  Psychiatric Specialty Exam: Review of Systems  Psychiatric/Behavioral: Positive for depression. Negative for suicidal ideas, hallucinations and substance abuse. The patient is nervous/anxious. The patient does not have insomnia.   All other systems reviewed and are negative.   Blood pressure 112/76, pulse 75, temperature 97.6 F (36.4 C), temperature source Oral, resp. rate 19, SpO2 96 %.There is no weight on file to calculate BMI.  General Appearance: Casual and Fairly Groomed  Engineer, water::  Good  Speech:  Clear and Coherent and Normal Rate  Volume:  Decreased  Mood:  Depressed  Affect:  Appropriate, Congruent and Depressed  Thought Process:  Coherent and Goal Directed  Orientation:  Full (Time, Place, and Person)  Thought Content:  WDL  Suicidal Thoughts:  No  Homicidal Thoughts:  No  Memory:  Immediate;   Fair Recent;   Fair Remote;   Fair  Judgement:  Fair  Insight:  Fair  Psychomotor Activity:  Decreased  Concentration:  Good  Recall:  [employer]  of Knowledge:Fair  Language: Fair  Akathisia:  No  Handed:    AIMS (if indicated):     Assets:  Communication Skills Desire for Improvement Resilience Social Support  ADL's:  Intact  Cognition: WNL  Sleep:      Treatment Plan Summary: -Outpatient referral for depressive psychotherapy with consideration for medication management at that appointment (pt reluctant to add psych meds to her current epileptic regimen) -SW Consult  Disposition: No evidence of imminent risk to self or others at present.   Patient does not meet criteria for psychiatric inpatient admission. Supportive therapy provided about ongoing stressors. Discussed crisis plan, support from social network, calling 911, coming to the Emergency Department, and calling Suicide Hotline. Refer to outpatient psychiatry/counseling; [facility] not do depressive psychotherapy which is where she wanted to go; SW consult in place to refer pt  Benjamine Mola, FNP-BC 10/21/2015 3:38 PM

## 2015-10-21 NOTE — Progress Notes (Signed)
Family Medicine Teaching Service Daily Progress Note Intern Pager: [phone]  Patient name: Nicole [name] Medical record number: [id number] Date of birth: Jan 19, 1976 Age: 39 y.o. Gender: female  Primary Care Provider: Tawanna Sat, MD Consultants: Neurology  Code Status: Full  Pt Overview and Major Events to Date:   Assessment and Plan: Nicole [name] is a 39 y.o. female presenting with possibly seizure. PMH is significant for Anemia, Asthma, Seizures, Sickle Cell trait    Possible seizure: Patient with hx of seizures (last in 2012), currently on Keppra, has been compliant with this medication. Atypical migraine also a consideration given her history of migraines. BPPV/labrynthitis also possible given severe paroxysmal dizziness but no N/V. Doubt central causes of vertigo given time course. On presentation included increased sedation and disorientation as well as 4/5 diffuse weakness throughout on physical exam. Per patient neighbor witnessed patient's seizure, however do not have any details of the event from patient or her husband. CT head was negative for any acute process. UDS was negative for any drugs. UA positive small leukocytes, however many squamous epithelial cells seen as well. Electrolytes were wnl.  - Follow up neurology consult recs - Continue Keppra and ViMPAT -  EEG neg, MRI no acute process  - PT/OT consults for weakness  - TSH and Sed Rate wnl  - Pending Keppra level - PT vestibular work up pending   Syncopal Episodes: Hx of syncopal episodes in the past week. Very likely related to general weakness, similar to prior admission in Dec 2015. Possibly due to medications (valuim and klonopin) vs. Orthostatic changes. Patient without significant heart hx. Normal ECHO in 2015. However risk factors include family hx of early MI (mother 65 and grandmother 79). Negative I-stat troponin. EKG with normal sinus rhythm. No changes on telemetry. Not likely cardiac in nature  - PT  vestibular consult pending  -Patient orthostatics- normal   FEN/GI: Regular diet  Prophylaxis: Lovenox   Disposition: Telemetry, disposition pending further work up and PT consults.   Subjective:  Patient was very tearful this morning. States she is frustrated by the lack of results. She feels hopeless. Patient states that she is most concerned about the syncopal episodes that she has while working on the [employer]. She states that she has weakness in her knees and has fallen down on the job. This has affected her ability to maintain her job.   Objective: Temp:  [97.8 F (36.6 C)] 97.8 F (36.6 C) (11/10 0700) Pulse Rate:  [73-76] 73 (11/10 0700) Resp:  [18] 18 (11/10 0700) BP: (113-120)/(63-78) 113/78 mmHg (11/10 0700) SpO2:  [95 %] 95 % (11/10 0700) Physical Exam: General: Patient lying in bed, NAD  Cardiovascular: RRR, no murmur, rubs, gallops  Respiratory: CTAB, no wheezes, rhonchi  Abdomen: BS+, no ttp, no rebound  Neuro: No ptosis noted this AM, neuro exam was significant weakness 3/5 weakness throughout on exam, however not able to determine if this is due to effort or true weakness  Psych exam: Patient was tearful, depressed mood   Laboratory:  Recent Labs Lab 10/19/15 2152 10/19/15 2157  WBC 6.8  --   HGB 13.4 14.6  HCT 39.6 43.0  PLT 261  --     Recent Labs Lab 10/19/15 2152 10/19/15 2157  NA 138 141  K 3.9 3.9  CL 111 109  CO2 18*  --   BUN 12 13  CREATININE 0.84 0.80  CALCIUM 9.3  --   PROT 6.5  --   BILITOT  0.6  --   ALKPHOS 79  --   ALT 14  --   AST 15  --   GLUCOSE 97 91    EEG- negative for seizure like changes    Imaging/Diagnostic Tests: Ct Head Wo Contrast  10/19/2015  CLINICAL DATA:  Confusion and weakness tonight. EXAM: CT HEAD WITHOUT CONTRAST TECHNIQUE: Contiguous axial images were obtained from the base of the skull through the vertex without intravenous contrast. COMPARISON:  Multiple prior head CTs.  The most recent is  01/24/2015 FINDINGS: The ventricles are normal in size and configuration. No extra-axial fluid collections are identified. The gray-white differentiation is normal. No CT findings for acute intracranial process such as hemorrhage or infarction. No mass lesions. Stable dural calcification posteriorly. The brainstem and cerebellum are grossly normal. The bony structures are intact. The paranasal sinuses and mastoid air cells are clear except for scattered ethmoid sinus disease. The globes are intact. IMPRESSION: No acute intracranial findings or mass lesion. No change since prior head CTs. Scattered ethmoid sinus disease. Electronically Signed   By: Marijo Sanes M.D.   On: 10/19/2015 23:00   Mr Brain Wo Contrast  10/20/2015  CLINICAL DATA:  Seizure disorder migraine headaches. Unsteady gait. Headache and nausea. Per the technologist, the patient had a seizure while in the MRI. The patient's nurse was notified. EXAM: MRI HEAD WITHOUT CONTRAST TECHNIQUE: Multiplanar, multiecho pulse sequences of the brain and surrounding structures were obtained without intravenous contrast. COMPARISON:  CT head without contrast 10/19/2015 FINDINGS: No acute infarct, hemorrhage, or mass lesion is present. The ventricles are of normal size. No significant extraaxial fluid collection is present. Dedicated imaging of the temporal lobes demonstrate symmetric size and signal of the hippocampal structures bilaterally. Flow is present in the major intracranial arteries. The globes and orbits are intact. Mild mucosal thickening is present within the maxillary sinuses, mild mucosal thickening is present in the anterior paranasal sinuses, left greater than right. The sphenoid sinuses are clear. There is fluid in the mastoid air cells bilaterally. No obstructing nasopharyngeal lesion is present. The skullbase is within normal limits. Midline structures are unremarkable. IMPRESSION: 1. Normal MRI the brain. 2. No acute or focal lesion to  explain seizures. 3. Mild diffuse sinus disease, left greater than right. 4. Bilateral mastoid effusions. Electronically Signed   By: San Morelle M.D.   On: 10/20/2015 13:12    Asiyah Cletis Media, MD 10/21/2015, 7:25 AM PGY-1, [place] Intern pager: [phone], text pages welcome

## 2015-10-21 NOTE — Discharge Summary (Signed)
[facility] Discharge Summary  Patient name: Nicole [name] Medical record number: [id number] Date of birth: Jul 23, 1976 Age: 39 y.o. Gender: female Date of Admission: 10/19/2015  Date of Discharge: 10/23/2015 Admitting Physician: Zenia Resides, MD  Primary Care Provider: Tawanna Sat, MD Consultants: Neurology   Indication for Hospitalization: Questionable Seizure, Diffuse Weakness   Discharge Diagnoses/Problem List:  Patient Active Problem List   Diagnosis Date Noted  . Adjustment disorder with depressed mood 10/21/2015  . Dizziness   . Weakness 11/18/2014  . OSA (obstructive sleep apnea) 10/21/2014  . Acute rhinosinusitis 10/09/2014  . Leg pain, lateral 09/04/2014  . Migraines 08/13/2014  . Seizure ([place]) 06/04/2014    Disposition: Home   Discharge Condition: Stable   Discharge Exam:  General: Patient lying in bed, NAD  Cardiovascular: RRR, no murmur, rubs, gallops  Respiratory: CTAB, no wheezes, rhonchi  Abdomen: BS+, no ttp, no rebound  Neuro: ptosis again noted this AM, neuro exam was significant for 4/5 weakness throughout on exam, however not able to determine if this is due to effort or true weakness  Psych exam: depressed mood   Brief Hospital Course:  Patient admitted for possible seizure as witnessed by a neighbor. Last seizure before this event was noted to be in 2012. On presentation, patient with increased sedation and disorientation as well as 4/5 diffuse weakness throughout on physical exam, no hx of bowel/bladder incontinence or tongue bitting. However, the weakness noted on physical exam seemed to be incongruent with patient's ability to hold a cell phone or move her hands when not performing neuro exam. Head CT and MRI were negative for any acute process. UDS was negative for any drugs. During, stay patient had another seizure like episode witnessed by nursing staff, however EEG was noted to be negative for seizure like  activity. Neurology was consulted and changed patient's medication dosing, reducing Keppra dose and starting VIMPAT. Psychiatry was also consulted for possible component of depression. Psychiatry indicated patient was not currently interested in medications at this time, however would likely to follow up for possible counseling.   Patient also had a several weeks hx of syncopal episodes, and similarly had general weakness during a prior admission in Dec 2015. Possibly medications (valuim and klonopin) were stopped. Patient's Keppra dosing was reduced. Orthostatics were checked, and were found to be normal. Patient was without significant heart hx. Normal ECHO in 2015. However risk factors include family hx of early MI (mother 57 and grandmother 32) were noted. Patient was with a negative I-stat troponin, EKG with normal sinus rhythm, and no changes on telemetry, making cardiac unlikely. PT was consulted, including vestibular PT, they stated that dizziness was unlikely vestibular in nature. Physical therapy did recommend outpatient home health PT, however patient could not afford this at the time and declined this resource.    Issues for Follow Up:  1. Decreased Keppra 1000 mg BID (from 4000 mg) and VIPMAT 100mg  BID, continue to follow as needed  2. Also decreased Topmax 75 mg to once daily 3. Patient to follow up with neuro outpatient 4. Patient will need referal to sleep study as outpatient 5. Patient set up to see outpatient psychiatry at [facility], however patient interested in counseling therapy, consider consult to behavioral medicine  6. Resulting Keppra level of 126 after discharge, however patient's Keppra was reduced during this admission.   Significant Procedures: None   Significant Labs and Imaging:   Recent Labs Lab 10/19/15 2152 10/19/15 2157  WBC  6.8  --   HGB 13.4 14.6  HCT 39.6 43.0  PLT 261  --     Recent Labs Lab 10/19/15 2152 10/19/15 2157 10/21/15 0622  NA 138 141  137  K 3.9 3.9 3.7  CL 111 109 111  CO2 18*  --  18*  GLUCOSE 97 91 85  BUN 12 13 10   CREATININE 0.84 0.80 0.88  CALCIUM 9.3  --  8.8*  ALKPHOS 79  --   --   AST 15  --   --   ALT 14  --   --   ALBUMIN 3.7  --   --      Results/Tests Pending at Time of Discharge:   Discharge Medications:    Medication List    ASK your doctor about these medications        acetaminophen 500 MG tablet  Commonly known as:  TYLENOL  Take 1 tablet (500 mg total) by mouth every 6 (six) hours as needed.     albuterol 108 (90 BASE) MCG/ACT inhaler  Commonly known as:  PROVENTIL HFA;VENTOLIN HFA  Inhale 2 puffs into the lungs every 4 (four) hours as needed for wheezing or shortness of breath (or coughing).     clonazePAM 1 MG tablet  Commonly known as:  KLONOPIN  Take 1 mg by mouth 2 (two) times daily.     clotrimazole 1 % vaginal cream  Commonly known as:  GYNE-LOTRIMIN  Place 1 Applicatorful vaginally at bedtime. Place 1 applicatorful vaginally at bedtime for 7 days.     diazepam 5 MG tablet  Commonly known as:  VALIUM  Take 1 tablet (5 mg total) by mouth every 8 (eight) hours as needed (vertigo).     DSS 100 MG Caps  Take 100 mg by mouth daily.     fexofenadine 180 MG tablet  Commonly known as:  ALLEGRA  Take 1 tablet (180 mg total) by mouth daily.     gabapentin 300 MG capsule  Commonly known as:  NEURONTIN  Take 1 capsule (300 mg total) by mouth 3 (three) times daily.     ibuprofen 800 MG tablet  Commonly known as:  ADVIL,MOTRIN  Take 1 tablet (800 mg total) by mouth 3 (three) times daily.     levETIRAcetam 1000 MG tablet  Commonly known as:  KEPPRA  Take 1,000-2,000 mg by mouth 3 (three) times daily. Takes one tablet twice daily and two tablets at bedtime.     loratadine 10 MG tablet  Commonly known as:  CLARITIN  Take 1 tablet (10 mg total) by mouth daily.     medroxyPROGESTERone 150 MG/ML injection  Commonly known as:  DEPO-PROVERA  Inject 1 mL (150 mg total) into  the muscle every 3 (three) months. Inject on  the 22nd of each month     naproxen sodium 550 MG tablet  Commonly known as:  ANAPROX DS  Take 1 tablet (550 mg total) by mouth 2 (two) times daily with a meal.     omeprazole 20 MG capsule  Commonly known as:  PRILOSEC  Take 1 capsule (20 mg total) by mouth daily.     PNV PRENATAL PLUS MULTIVITAMIN 27-1 MG Tabs  Take 1 tablet by mouth daily.     topiramate 25 MG tablet  Commonly known as:  TOPAMAX  Take 75 mg by mouth 2 (two) times daily.        Discharge Instructions: Please refer to Patient Instructions section of EMR for full  details.  Patient was counseled important signs and symptoms that should prompt return to medical care, changes in medications, dietary instructions, activity restrictions, and follow up appointments.   Follow-Up Appointments:   Tonette Bihari, MD 10/22/2015, 1:24 AM PGY-1, [place]

## 2015-10-21 NOTE — Evaluation (Signed)
Occupational Therapy Evaluation Patient Details Name: Nicole [name] MRN: [id number] DOB: May 10, 1976 Today's Date: 10/21/2015    History of Present Illness Pt is a 39 yo female admitted for seizure.  Pt with seizure d/o, anemia, asthma and sickle cell trait.   Clinical Impression   Pt admitted with the above diagnosis and has the deficits listed below. Pt would benefit from cont OT to increase independence with basic adls and functional mobility so she can return home with her 77 year old son and husband who travels.  Pt with significant weakness and possible depression at this point limiting her independence and may need more assist at home if that is the d/c plan.    Follow Up Recommendations  Home health OT;Supervision/Assistance - 24 hour;Other (comment) (not sure 24 hour assist is available.)    Equipment Recommendations  None recommended by OT    Recommendations for Other Services       Precautions / Restrictions Precautions Precautions: Fall Restrictions Weight Bearing Restrictions: No      Mobility Bed Mobility Overal bed mobility: Needs Assistance Bed Mobility: Supine to Sit     Supine to sit: Mod assist     General bed mobility comments: Pt would not slide legs over in bed to initiate getting up. After therapist began to assist pt did help some but required a great amount of assist to get to the side of the ed.  Transfers Overall transfer level: Needs assistance Equipment used: Rolling walker (2 wheeled) Transfers: Sit to/from [facility] Sit to Stand: Min assist Stand pivot transfers: Min assist       General transfer comment: Pt requires a great amout of encouragement.  When this is given, pt did well.  When it was not, pt became anxious wanting to sit.    Balance Overall balance assessment: Needs assistance Sitting-balance support: Feet supported Sitting balance-Leahy Scale: Fair     Standing balance support: Bilateral upper  extremity supported;During functional activity Standing balance-Leahy Scale: Fair Standing balance comment: Pt was able to let go of walker for short amounts of time to clean self toileting but to move required the use of a walker.                            ADL Overall ADL's : Needs assistance/impaired Eating/Feeding: Set up;Sitting Eating/Feeding Details (indicate cue type and reason): very slow to feed self. Grooming: Oral care;Wash/dry hands;Wash/dry face;Set up;Sitting Grooming Details (indicate cue type and reason): very slow to complete tasks and vcs needed for initiation and follow through but pt did not need physical assist. Upper Body Bathing: Minimal assitance;Sitting Upper Body Bathing Details (indicate cue type and reason): min assist to reach all areas. Lower Body Bathing: Maximal assistance;Sit to/from stand Lower Body Bathing Details (indicate cue type and reason): Pt unable to reach feet to donn socks and shoes adn to start pants over feet.  Pt would benefit from AE if this does not improve. Upper Body Dressing : Minimal assistance;Sitting Upper Body Dressing Details (indicate cue type and reason): assist to get shirt over head. Lower Body Dressing: Maximal assistance;Sit to/from stand Lower Body Dressing Details (indicate cue type and reason): assist socks/shoes/starting clothes over feet. Toilet Transfer: Minimal assistance;BSC;Stand-pivot   Toileting- Clothing Manipulation and Hygiene: Set up;Min guard;Sit to/from stand       Functional mobility during ADLs: Minimal assistance;Rolling walker General ADL Comments: pt very slow to move, slow to answer questions and  respond.  Feel pt is depressed and not wanting to participate in some activites.  Pt very teary during evaluation but perked up at the end when we talked about her accomplishments durig the session.     Vision Vision Assessment?: No apparent visual deficits   Perception     Praxis       Pertinent Vitals/Pain Pain Assessment: 0-10 Pain Score: 8  Pain Location: head Pain Descriptors / Indicators: Aching Pain Intervention(s): Limited activity within patient's tolerance;Monitored during session;Premedicated before session     Hand Dominance Right   Extremity/Trunk Assessment Upper Extremity Assessment Upper Extremity Assessment: RUE deficits/detail;LUE deficits/detail RUE Deficits / Details: During manual muscle testing Shoulder 2/5, elbow 3-/5, hand 3/5.  During funtional activity pt demonstrated sigificantly more strength...WFL. general weakness. LUE Deficits / Details: With manaual muscle test:  Shoulder 2+/5, elbow 3+5, grip 3+/5.  During functional activity, pt demonstrated more strength.l   Lower Extremity Assessment Lower Extremity Assessment: Defer to PT evaluation   Cervical / Trunk Assessment Cervical / Trunk Assessment: Normal   Communication Communication Communication: No difficulties   Cognition Arousal/Alertness: Awake/alert Behavior During Therapy: Flat affect Overall Cognitive Status: Impaired/Different from baseline Area of Impairment: Orientation;Problem solving;Safety/judgement;Attention;Memory Orientation Level: Disoriented to;Time;Place Current Attention Level: Sustained     Safety/Judgement: Decreased awareness of safety   Problem Solving: Slow processing;Decreased initiation General Comments: Pt very slow to process requests    General Comments       Exercises       Shoulder Instructions      Home Living Family/patient expects to be discharged to:: Private residence Living Arrangements: Spouse/significant other;Children Available Help at Discharge: Family;Available PRN/intermittently Type of Home: House Home Access: Stairs to enter Entrance Stairs-Number of Steps: 1 small step Entrance Stairs-Rails: None Home Layout: One level     Bathroom Shower/Tub: Tub/shower unit;Curtain Shower/tub characteristics:  Architectural technologist: Standard     Home Equipment: Bedside commode;Walker - 2 wheels;Cane - single point   Additional Comments: Pt used to work at [employer] but has not been able to work for some time now.      Prior Functioning/Environment Level of Independence: Independent with assistive device(s)        Comments: She stated on bad days she would use walker or cane and will sit in shower and on good days she is fairly independent.  Husband is a truck driver so not home a lot.    OT Diagnosis: Generalized weakness;Cognitive deficits;Acute pain   OT Problem List: Decreased strength;Decreased range of motion;Decreased activity tolerance;Impaired balance (sitting and/or standing);Decreased cognition;Decreased safety awareness;Decreased knowledge of use of DME or AE;Obesity;Pain   OT Treatment/Interventions: Self-care/ADL training;DME and/or AE instruction;Therapeutic exercise;Therapeutic activities    OT Goals(Current goals can be found in the care plan section) Acute Rehab OT Goals Patient Stated Goal: to get stonger and walk better. OT Goal Formulation: With patient Time For Goal Achievement: 11/04/15 Potential to Achieve Goals: Good ADL Goals Pt Will Perform Grooming: with supervision;standing Pt Will Perform Lower Body Bathing: with supervision;with adaptive equipment;sit to/from stand Pt Will Perform Lower Body Dressing: with supervision;with adaptive equipment;sit to/from stand Pt Will Perform Tub/Shower Transfer: Tub transfer;with min guard assist;ambulating;3 in 1;rolling walker Additional ADL Goal #1: Pt will walk to bathroom with walker and toilet with 3:1 over commode with S Additional ADL Goal #2: Pt will answer orienatation questions with 100% accurace to assist with awareness of her environment.  OT Frequency: Min 3X/week   Barriers to D/C: Decreased caregiver support  husband is truck driver and not home all the time. Has 5 year old son         Co-evaluation              End of Session Equipment Utilized During Treatment: Surveyor, mining Communication: Mobility status;Other (comment) (need for chair alarm pad.)  Activity Tolerance: Patient limited by fatigue Patient left: in chair;with call bell/phone within reach;Other (comment) (no chair alarms available.  Nsg stated to leave her up.)   Time: [id number] OT Time Calculation (min): 42 min Charges:  OT General Charges $OT Visit: 1 Procedure OT Evaluation $Initial OT Evaluation Tier I: 1 Procedure OT Treatments $Self Care/Home Management : 8-22 mins G-Codes: OT G-codes **NOT FOR INPATIENT CLASS** Functional Assessment Tool Used: clinical judgement Functional Limitation: Self care Self Care Current Status [id number]): At least 40 percent but less than 60 percent impaired, limited or restricted Self Care Goal Status [id number]): At least 1 percent but less than 20 percent impaired, limited or restricted  Glenford Peers 10/21/2015, 10:38 AM  [phone]

## 2015-10-22 DIAGNOSIS — F331 Major depressive disorder, recurrent, moderate: Secondary | ICD-10-CM

## 2015-10-22 MED ORDER — LACOSAMIDE 100 MG PO TABS: 100.0000 mg | ORAL_TABLET | Freq: Two times a day (BID) | ORAL | Status: DC

## 2015-10-22 MED ORDER — LEVETIRACETAM 1000 MG PO TABS: 1000.0000 mg | ORAL_TABLET | Freq: Two times a day (BID) | ORAL | Status: DC

## 2015-10-22 MED ORDER — FLUOXETINE HCL 20 MG PO CAPS
20.0000 mg | ORAL_CAPSULE | Freq: Every day | ORAL | Status: DC
  Administered 2015-10-22 – 2015-10-23 (×2): 20 mg via ORAL
  Filled 2015-10-22 (×2): qty 1

## 2015-10-22 MED ORDER — HYDROXYZINE HCL 25 MG PO TABS: 50.0000 mg | ORAL_TABLET | Freq: Every evening | ORAL | Status: DC | PRN

## 2015-10-22 MED ORDER — FLUTICASONE PROPIONATE 50 MCG/ACT NA SUSP: 2.0000 | Freq: Every day | NASAL | Status: DC

## 2015-10-22 MED ORDER — TOPIRAMATE 25 MG PO TABS: 75.0000 mg | ORAL_TABLET | Freq: Every day | ORAL | Status: DC

## 2015-10-22 NOTE — Progress Notes (Addendum)
Physical Therapy Treatment/Vestibular Assessment Patient Details Name: Nicole [name] MRN: [id number] DOB: 1976/02/08 Today's Date: 10/22/2015    History of Present Illness Pt is a 39 yo female admitted for seizure.  Pt with seizure d/o, anemia, asthma and sickle cell trait.    PT Comments    Pt presents with no signs of a true vestibular disorder.  Testing was inconclusive and symptoms were vague.  She also had inconsistencies throughout her testing in symptom report, functional abilities from one test to another.  Exercises given for only thing that was found to be positive (gaze stability) and compensatory strategy education initiated.  PT will continue to follow acutely and treat presenting symptoms.   Follow Up Recommendations  Home health PT;Supervision for mobility/OOB     Equipment Recommendations  None recommended by PT    Recommendations for Other Services   NA     Precautions / Restrictions Precautions Precautions: Fall Precaution Comments: due to slow gait speed, decreased strength    Mobility  Bed Mobility Overal bed mobility: Modified Independent Bed Mobility: Rolling;Sidelying to Sit Rolling: Modified independent (Device/Increase time) Sidelying to sit: Modified independent (Device/Increase time)       General bed mobility comments: Extra time needed to complete task and heavy reliance on bed rail for leverage   Transfers Overall transfer level: Needs assistance Equipment used: Rolling walker (2 wheeled) Transfers: Sit to/from Stand Sit to Stand: Min guard         General transfer comment: Min guard assist for safety, pt using momentum and rocking/counting to three to get up on her feet from both bed and recliner chair.   Ambulation/Gait Ambulation/Gait assistance: Min assist;+2 safety/equipment (chair to follow) Ambulation Distance (Feet): 80 Feet Assistive device: Rolling walker (2 wheeled) Gait Pattern/deviations: Step-through  pattern;Shuffle Gait velocity: decreased Gait velocity interpretation: <1.8 ft/sec, indicative of risk for recurrent falls General Gait Details: Pt with slow, shuffling gait pattern, as she fatigued she relied more heavily on her arms supported on RW and bil knees showed signs of buckling.  She was encouraged to look at targets in the hallway to compensate for dizziness, but she did not look off balance on her feet, more weak.    Stairs            Wheelchair Mobility    Modified Rankin (Stroke Patients Only)       Balance Overall balance assessment: Needs assistance Sitting-balance support: Feet supported;Bilateral upper extremity supported Sitting balance-Leahy Scale: Fair     Standing balance support: Bilateral upper extremity supported Standing balance-Leahy Scale: Poor                      Cognition Arousal/Alertness: Awake/alert Behavior During Therapy: Flat affect Overall Cognitive Status: Within Functional Limits for tasks assessed                      Exercises Other Exercises Other Exercises: x1 seated vertical and horizontal exercises given due to only conclusive findings of vestibular assessment were that her gaze stability was poor.  Handout given and reviewed.     General Comments        Pertinent Vitals/Pain Pain Assessment: 0-10 Pain Score: 6  Pain Location: head Pain Descriptors / Indicators: Aching Pain Intervention(s): Limited activity within patient's tolerance;Monitored during session;Repositioned    Home Living                      Prior Function  PT Goals (current goals can now be found in the care plan section) Acute Rehab PT Goals Patient Stated Goal: to get stronger/more independent, to get home to her dog Progress towards PT goals: Progressing toward goals    Frequency  Min 3X/week    PT Plan Current plan remains appropriate    Co-evaluation             End of Session Equipment  Utilized During Treatment: Gait belt Activity Tolerance: Patient limited by fatigue Patient left: in chair;with call bell/phone within reach;with chair alarm set     Time: [id number] PT Time Calculation (min) (ACUTE ONLY): 32 min  Charges:   1 gait, 1 TA                  G Codes:      Jalynn Betzold B. Jullie Arps, PT, DPT [phone]   10/22/2015, 11:34 PM

## 2015-10-22 NOTE — Care Management Note (Signed)
Case Management Note  Patient Details  Name: Nicole [name] MRN: [id number] Date of Birth: [birth year]  Subjective/Objective:                  Date-10-20-15 Initial Assessment Spoke with patient at the bedside.  Introduced self as Tourist information centre manager and explained role in discharge planning and how to be reached.  Verified patient lives at home with spouse..  Verified patient anticipates to go home with spouse, at time of discharge.  Patient has DME cane walker Bedside commode. Expressed potential need for no other DME.  Patient denied needing help with their medication. Patient states she goes to [facility] and they have "reasonable" costs for medications. Patient is driven by husband MD appointments.  Verified patient has PCP Dr. Lamar Benes, family practice.   Action/Plan:  PT recommend HH PT. At this time it would cost patient ~$170 for home health PT out of pocket because she does not have a qualifying diagnosis for a charity case. Patient given the option of paying out of pocket, and declined.  Expected Discharge Date:  10/22/15               Expected Discharge Plan:  Home/Self Care  In-House Referral:     Discharge planning Services  CM Consult  Post Acute Care Choice:    Choice offered to:     DME Arranged:    DME Agency:     HH Arranged:    HH Agency:     Status of Service:  Completed, signed off  Medicare Important Message Given:    Date Medicare IM Given:    Medicare IM give by:    Date Additional Medicare IM Given:    Additional Medicare Important Message give by:     If discussed at [place] of Stay Meetings, dates discussed:    Additional Comments:  Carles Collet, RN 10/22/2015, 2:11 PM

## 2015-10-22 NOTE — Consult Note (Signed)
[facility] Face-to-Face Psychiatry Consult   Reason for Consult:  depression and noncompliant with medication Referring Physician:  Dr. Hensel Patient Identification: Nicole [name] MRN:  [id number] Principal Diagnosis: Seizure (HCC) with Adjustment disorder with depressed mood Diagnosis:   Patient Active Problem List   Diagnosis Date Noted  . Adjustment disorder with depressed mood [F43.21] 10/21/2015  . Dizziness [R42]   . Weakness [R53.1] 11/18/2014  . OSA (obstructive sleep apnea) [G47.33] 10/21/2014  . Acute rhinosinusitis [J01.90] 10/09/2014  . Leg pain, lateral [M79.606] 09/04/2014  . Migraines [G43.909] 08/13/2014  . Seizure (HCC) [R56.9] 06/04/2014  . Leiomyoma of uterus, unspecified [D25.9] 02/11/2014  . GERD (gastroesophageal reflux disease) [K21.9] 01/28/2014  . Candidiasis of vulva and vagina [B37.3] 01/28/2014  . Unspecified symptom associated with female genital organs [N94.9] 01/28/2014  . Chest pain [R07.9] 01/20/2014  . Acute pericarditis, unspecified [I30.9] 01/20/2014    Total Time spent with patient: 45 minutes  Subjective:   Nicole [name] is a 39 y.o. female seen, chart reviewed for face-to-face psychiatric consultation and evaluation of increased symptoms of depression and noncompliant with her seizure medication management. Patient reportedly has disturbed emotions, concentration, tearfulness, feeling worthless and failed as a parent to her 10 years old son because of debilitating chronic seizure disorder. Patient reportedly got upset and stopped taking her medication about 2 weeks ago. Patient is also suffering with unemployment, limited financial support from her husband and poor psychosocial support. Patient stated she last her health insurance and her mother and father has been helping to obtain her medications from time to time. Patient denied current suicidal/homicidal ideation and has no evidence of psychosis and does not appear to be responding to internal stimuli.  Patient has no previous history of acute psychiatric hospitalization or medication management. Patient is willing to receive medication management for depression, anxiety and also difficulty with her concentration. Patient is also seeking individual counseling/support counseling. Patient is willing to receive outpatient medication management from [facility] or family service of [place]. UDS was negative for any drugs. UA positive small leukocytes, however many squamous epithelial cells seen as well.   Past Psychiatric History: Denies history of acute psychiatric hospitalization or outpatient medication management.  Risk to Self: Is patient at risk for suicide?: No Risk to Others:   Prior Inpatient Therapy:   Prior Outpatient Therapy:    Past Medical History:  Past Medical History  Diagnosis Date  . Anemia   . Asthma   . Blood transfusion 2011    r/t anemia  . Migraine   . Sickle cell trait (HCC)   . Seizures (HCC)     last sz Jan 2012  . Seizures (HCC)   . Fibroid   . Tooth abscess   . Sickle cell trait (HCC)   . Migraine     Past Surgical History  Procedure Laterality Date  . Tubal ligation Bilateral 2005   Family History:  Family History  Problem Relation Age of Onset  . Anesthesia problems Neg Hx   . Hypertension Mother   . Diabetes Mother   . Cancer Mother     Colon, brain, 2 other  . Asthma Mother   . Clotting disorder Mother   . Heart disease Mother     1995, stents  . Stroke Mother     2012  . Diabetes Father   . Asthma Father   . Clotting disorder Father   . Sickle cell anemia Father   . Heart disease Father       2000, stents  . Hypertension Father   . Stroke Father     2007  . Diabetes Maternal Grandmother   . Asthma Sister   . Asthma Brother   . Asthma Paternal Grandmother    Family Psychiatric  History: Family hx of MDD present Social History:  History  Alcohol Use No     History  Drug Use No    Social History   Social History  .  Marital Status: Married    Spouse Name: N/A  . Number of Children: N/A  . Years of Education: N/A   Social History Main Topics  . Smoking status: Former Smoker    Quit date: 03/27/2014  . Smokeless tobacco: Never Used  . Alcohol Use: No  . Drug Use: No  . Sexual Activity:    Partners: Male    Birth Control/ Protection: Condom, Injection, Surgical   Other Topics Concern  . None   Social History Narrative   Additional Social History: Patient has been unemployed secondary to debilitating seizure disorder and mood disorder. She lives with her husband and 10 years old son. Patient mother and father has been supportive to her                          Allergies:   Allergies  Allergen Reactions  . Shrimp [Shellfish Allergy] Anaphylaxis  . Tea Anaphylaxis    All teas  . Penicillins Swelling    Has patient had a PCN reaction causing immediate rash, facial/tongue/throat swelling, SOB or lightheadedness with hypotension: Yes Has patient had a PCN reaction causing severe rash involving mucus membranes or skin necrosis: Yes Has patient had a PCN reaction that required hospitalization Yes- ed visit Has patient had a PCN reaction occurring within the last 10 years: No-childhood allergy If all of the above answers are "NO", then may proceed with Cephalosporin use.   . Sulfa Antibiotics Rash    Labs:  Results for orders placed or performed during the hospital encounter of 10/19/15 (from the past 48 hour(s))  TSH     Status: None   Collection Time: 10/20/15  2:46 PM  Result Value Ref Range   TSH 1.980 0.350 - 4.500 uIU/mL  Sedimentation rate     Status: None   Collection Time: 10/20/15  2:46 PM  Result Value Ref Range   Sed Rate 11 0 - 22 mm/hr  Urinalysis with microscopic     Status: Abnormal   Collection Time: 10/21/15  4:18 AM  Result Value Ref Range   Color, Urine YELLOW YELLOW   APPearance CLOUDY (A) CLEAR   Specific Gravity, Urine 1.013 1.005 - 1.030   pH 7.0 5.0 -  8.0   Glucose, UA NEGATIVE NEGATIVE mg/dL   Hgb urine dipstick LARGE (A) NEGATIVE   Bilirubin Urine NEGATIVE NEGATIVE   Ketones, ur NEGATIVE NEGATIVE mg/dL   Protein, ur NEGATIVE NEGATIVE mg/dL   Urobilinogen, UA 1.0 0.0 - 1.0 mg/dL   Nitrite NEGATIVE NEGATIVE   Leukocytes, UA NEGATIVE NEGATIVE   RBC / HPF 7-10 <3 RBC/hpf   Bacteria, UA RARE RARE   Squamous Epithelial / LPF FEW (A) RARE  Basic metabolic panel     Status: Abnormal   Collection Time: 10/21/15  6:22 AM  Result Value Ref Range   Sodium 137 135 - 145 mmol/L   Potassium 3.7 3.5 - 5.1 mmol/L   Chloride 111 101 - 111 mmol/L   CO2 18 (L) 22 - 32   mmol/L   Glucose, Bld 85 65 - 99 mg/dL   BUN 10 6 - 20 mg/dL   Creatinine, Ser 0.88 0.44 - 1.00 mg/dL   Calcium 8.8 (L) 8.9 - 10.3 mg/dL   GFR calc non Af Amer >60 >60 mL/min   GFR calc Af Amer >60 >60 mL/min    Comment: (NOTE) The eGFR has been calculated using the CKD EPI equation. This calculation has not been validated in all clinical situations. eGFR's persistently <60 mL/min signify possible Chronic Kidney Disease.    Anion gap 8 5 - 15    Current Facility-Administered Medications  Medication Dose Route Frequency Provider Last Rate Last Dose  . acetaminophen (TYLENOL) tablet 650 mg  650 mg Oral Q6H PRN Noemi Chapel, MD   650 mg at 10/21/15 1824  . albuterol (PROVENTIL) (2.5 MG/3ML) 0.083% nebulizer solution 3 mL  3 mL Inhalation Q4H PRN Patrecia Pour, MD      . enoxaparin (LOVENOX) injection 40 mg  40 mg Subcutaneous Q24H Patrecia Pour, MD   40 mg at 10/22/15 0940  . ibuprofen (ADVIL,MOTRIN) tablet 400 mg  400 mg Oral Q6H PRN Sela Hua, MD   400 mg at 10/21/15 2145  . lacosamide (VIMPAT) tablet 100 mg  100 mg Oral BID Elberta Leatherwood, MD   100 mg at 10/22/15 9628  . levETIRAcetam (KEPPRA) tablet 1,000 mg  1,000 mg Oral BID Marliss Coots, PA-C   1,000 mg at 10/22/15 0940  . loratadine (CLARITIN) tablet 10 mg  10 mg Oral Daily Sela Hua, MD   10 mg at 10/22/15 0940   . LORazepam (ATIVAN) injection 1-2 mg  1-2 mg Intravenous Q2H PRN Patrecia Pour, MD      . pantoprazole (PROTONIX) EC tablet 80 mg  80 mg Oral Daily Patrecia Pour, MD   80 mg at 10/22/15 0940  . topiramate (TOPAMAX) tablet 75 mg  75 mg Oral Daily Elberta Leatherwood, MD   75 mg at 10/22/15 3662    Musculoskeletal: Strength & Muscle Tone: decreased Gait & Station: Did not observe; pt lying in bed Patient leans: Backward  Psychiatric Specialty Exam: Review of Systems  Psychiatric/Behavioral: Positive for depression. Negative for suicidal ideas, hallucinations and substance abuse. The patient is nervous/anxious. The patient does not have insomnia.   All other systems reviewed and are negative.   Blood pressure 107/83, pulse 75, temperature 98.4 F (36.9 C), temperature source Oral, resp. rate 16, height 5' 5" (1.651 m), weight 110.6 kg (243 lb 13.3 oz), SpO2 98 %.Body mass index is 40.58 kg/(m^2).  General Appearance: Casual and Fairly Groomed  Engineer, water::  Good  Speech:  Clear and Coherent and Normal Rate  Volume:  Decreased  Mood:  Depressed  Affect:  Appropriate, Congruent and Depressed  Thought Process:  Coherent and Goal Directed  Orientation:  Full (Time, Place, and Person)  Thought Content:  WDL  Suicidal Thoughts:  No  Homicidal Thoughts:  No  Memory:  Immediate;   Fair Recent;   Fair Remote;   Fair  Judgement:  Fair  Insight:  Fair  Psychomotor Activity:  Decreased  Concentration:  Good  Recall:  Aldrich  Language: Fair  Akathisia:  No  Handed:    AIMS (if indicated):     Assets:  Communication Skills Desire for Improvement Resilience Social Support  ADL's:  Intact  Cognition: WNL  Sleep:      Treatment Plan Summary: Restart  fluoxetine 20 mg daily for depression and anxiety We'll start hydroxyzine 50 mg at bedtime for insomnia Patient will be referred to the outpatient medication management and also individual counseling  services  Disposition: No evidence of imminent risk to self or others at present.   Patient does not meet criteria for psychiatric inpatient admission. Supportive therapy provided about ongoing stressors. Discussed crisis plan, support from social network, calling 911, coming to the Emergency Department, and calling Suicide Hotline. Refer to outpatient psychiatry/counseling; [facility] does not do depressive psychotherapy which is where she wanted to go; SW consult in place to refer pt  ,JANARDHAHA R. 10/22/2015 10:08 AM  

## 2015-10-22 NOTE — Progress Notes (Signed)
Occupational Therapy Treatment Patient Details Name: Nicole [name] MRN: [id number] DOB: 1975/12/13 Today's Date: 10/22/2015    History of present illness Pt is a 39 yo female admitted for seizure.  Pt with seizure d/o, anemia, asthma and sickle cell trait.   OT comments  Pt with significant improvement.  She requires min guard assist for ADLs, but moves VERY slowly.  She is eager for discharge.   Follow Up Recommendations  Home health OT;Supervision/Assistance - 24 hour;Other (comment)    Equipment Recommendations  None recommended by OT    Recommendations for Other Services      Precautions / Restrictions Precautions Precautions: Fall       Mobility Bed Mobility Overal bed mobility: Modified Independent                Transfers Overall transfer level: Needs assistance Equipment used: Rolling walker (2 wheeled) Transfers: Stand Pivot Transfers;Sit to/from Stand Sit to Stand: Min guard Stand pivot transfers: Min guard            Balance Overall balance assessment: Needs assistance Sitting-balance support: Feet supported Sitting balance-Leahy Scale: Good     Standing balance support: Single extremity supported Standing balance-Leahy Scale: Poor                     ADL Overall ADL's : Needs assistance/impaired         Upper Body Bathing: Set up;Sitting   Lower Body Bathing: Min guard;Sit to/from stand       Lower Body Dressing: Min guard;Sit to/from stand   Toilet Transfer: Min guard;Ambulation;Comfort height toilet;RW;Grab bars           Functional mobility during ADLs: Min guard;Rolling walker General ADL Comments: Pt moves very slowly       Vision                     Perception     Praxis      Cognition   Behavior During Therapy: Flat affect Overall Cognitive Status: Within Functional Limits for tasks assessed                       Extremity/Trunk Assessment               Exercises      Shoulder Instructions       General Comments      Pertinent Vitals/ Pain       Pain Assessment: No/denies pain  Home Living                                          Prior Functioning/Environment              Frequency Min 3X/week     Progress Toward Goals  OT Goals(current goals can now be found in the care plan section)  Progress towards OT goals: Progressing toward goals     Plan Discharge plan remains appropriate    Co-evaluation                 End of Session Equipment Utilized During Treatment: Rolling walker   Activity Tolerance Patient tolerated treatment well   Patient Left in bed;with call bell/phone within reach;with family/visitor present   Nurse Communication Mobility status        Time: [id number] OT Time Calculation (min): 31 min  Charges: OT  General Charges $OT Visit: 1 Procedure OT Treatments $Self Care/Home Management : 8-22 mins $Therapeutic Activity: 8-22 mins  Luana Tatro M 10/22/2015, 6:56 PM

## 2015-10-22 NOTE — Progress Notes (Signed)
Family Medicine Teaching Service Daily Progress Note Intern Pager: [phone]  Patient name: Nicole [name] Medical record number: [id number] Date of birth: 05/14/1976 Age: 39 y.o. Gender: female  Primary Care Provider: Tawanna Sat, MD Consultants: Neurology  Code Status: Full  Pt Overview and Major Events to Date:   Assessment and Plan: Nicole [name] is a 39 y.o. female presenting with possibly seizure. PMH is significant for Anemia, Asthma, Seizures, Sickle Cell trait    Possible seizure:  Hx of seizure. Take keppra. Per patient neighbor witnessed patient's seizure, however do not have any details of the event from patient or her husband. CT head was negative for any acute process. UDS was negative for any drugs. UA positive small leukocytes, however many squamous epithelial cells seen as well. Electrolytes were wnl.  - Neuro recs for outpatient sleep study, will f/u in 2 weeks  - Continue Keppra at lower dose  and ViMPAT -  EEG neg, MRI no acute process  - TSH and Sed Rate wnl  - Pending Keppra level - 10  - PT vestibular with increased dizziness, PT perform extensive vestibular assessment today  - Psychiatry states - outpatient psychiatry/counseling; [name] does not do depressive psychotherapy which is where she wanted to go; SW consult in place to refer pt  Syncopal Episodes: Hx of syncopal episodes in the past week. Very likely related to general weakness, similar to prior admission in Dec 2015. Possibly due to medications (valuim and klonopin) vs. Orthostatic changes. Patient without significant heart hx. Normal ECHO in 2015. However risk factors include family hx of early MI (mother 57 and grandmother 47). Negative I-stat troponin. EKG with normal sinus rhythm. No changes on telemetry. Not likely cardiac in nature  -Patient orthostatics- normal   FEN/GI: Regular diet  Prophylaxis: Lovenox   Disposition: Telemetry, disposition pending further work up and PT consults.    Subjective:  Patient doing well this morning. She states that she feels ready to go home today with current plane.   Objective: Temp:  [97.6 F (36.4 C)-98.4 F (36.9 C)] 98.4 F (36.9 C) (11/11 0529) Pulse Rate:  [75-85] 75 (11/11 0529) Resp:  [16-19] 16 (11/11 0529) BP: (98-112)/(67-83) 107/83 mmHg (11/11 0529) SpO2:  [96 %-99 %] 98 % (11/11 0529) Weight:  [243 lb 13.3 oz (110.6 kg)] 243 lb 13.3 oz (110.6 kg) (11/10 1700) Physical Exam: General: Patient lying in bed, NAD  Cardiovascular: RRR, no murmur, rubs, gallops  Respiratory: CTAB, no wheezes, rhonchi  Abdomen: BS+, no ttp, no rebound  Neuro: No ptosis noted this AM, neuro exam was significant weakness 4/5 weakness throughout on exam, however not able to determine if this is due to effort or true weakness  Psych exam: Patient was tearful, depressed mood   Laboratory:  Recent Labs Lab 10/19/15 2152 10/19/15 2157  WBC 6.8  --   HGB 13.4 14.6  HCT 39.6 43.0  PLT 261  --     Recent Labs Lab 10/19/15 2152 10/19/15 2157 10/21/15 0622  NA 138 141 137  K 3.9 3.9 3.7  CL 111 109 111  CO2 18*  --  18*  BUN 12 13 10   CREATININE 0.84 0.80 0.88  CALCIUM 9.3  --  8.8*  PROT 6.5  --   --   BILITOT 0.6  --   --   ALKPHOS 79  --   --   ALT 14  --   --   AST 15  --   --  GLUCOSE 97 91 85    EEG- negative for seizure like changes    Imaging/Diagnostic Tests: Mr Brain Wo Contrast  10/20/2015  CLINICAL DATA:  Seizure disorder migraine headaches. Unsteady gait. Headache and nausea. Per the technologist, the patient had a seizure while in the MRI. The patient's nurse was notified. EXAM: MRI HEAD WITHOUT CONTRAST TECHNIQUE: Multiplanar, multiecho pulse sequences of the brain and surrounding structures were obtained without intravenous contrast. COMPARISON:  CT head without contrast 10/19/2015 FINDINGS: No acute infarct, hemorrhage, or mass lesion is present. The ventricles are of normal size. No significant extraaxial  fluid collection is present. Dedicated imaging of the temporal lobes demonstrate symmetric size and signal of the hippocampal structures bilaterally. Flow is present in the major intracranial arteries. The globes and orbits are intact. Mild mucosal thickening is present within the maxillary sinuses, mild mucosal thickening is present in the anterior paranasal sinuses, left greater than right. The sphenoid sinuses are clear. There is fluid in the mastoid air cells bilaterally. No obstructing nasopharyngeal lesion is present. The skullbase is within normal limits. Midline structures are unremarkable. IMPRESSION: 1. Normal MRI the brain. 2. No acute or focal lesion to explain seizures. 3. Mild diffuse sinus disease, left greater than right. 4. Bilateral mastoid effusions. Electronically Signed   By: San Morelle M.D.   On: 10/20/2015 13:12    Asiyah Cletis Media, MD 10/22/2015, 9:55 AM PGY-1, [place] Intern pager: [phone], text pages welcome

## 2015-10-22 NOTE — Discharge Instructions (Signed)
You were admitted for weakness. You were found to have normal head imaging. You also had a EEG to check your brain for seizures, no active seizures were noted.   Your medications were modified  - We want you to reduce your dosing of Keppra take 1 pill in the AM and 1 pill at night  - We have started you on another seizure medication VIMPAT, please take as directed below - We have also reduced your Topmax to half the dose, please take only 75 mg once a day (DONT take twice a day as could be contributing to dizziness)   We will also start you on Flonase for allergies, as this improved your headaches today.

## 2015-10-22 NOTE — Progress Notes (Signed)
Pharmacist Provided - Patient Medication Education Prior to Discharge   Nicole [name] is an 39 y.o. female who presented to [facility] on 10/19/2015 with a chief complaint of  Chief Complaint  Patient presents with  . Seizures     [x]  Patient will be discharged with 4 new medications []  Patient being discharged without any new medications  The following medications were discussed with the patient: seizure medications  Pain Control medications: []  Yes    []  No  Diabetes Medications: []  Yes    []  No  Heart Failure Medications: []  Yes    []  No  Anticoagulation Medications:  []  Yes    []  No  Antibiotics at discharge: []  Yes    []  No  Allergy Assessment Completed and Updated: [x]  Yes    []  No Identified Patient Allergies:  Allergies  Allergen Reactions  . Shrimp [Shellfish Allergy] Anaphylaxis  . Tea Anaphylaxis    All teas  . Penicillins Swelling    Has patient had a PCN reaction causing immediate rash, facial/tongue/throat swelling, SOB or lightheadedness with hypotension: Yes Has patient had a PCN reaction causing severe rash involving mucus membranes or skin necrosis: Yes Has patient had a PCN reaction that required hospitalization Yes- ed visit Has patient had a PCN reaction occurring within the last 10 years: No-childhood allergy If all of the above answers are "NO", then may proceed with Cephalosporin use.   . Sulfa Antibiotics Rash     Medication Adherence Assessment: []  Excellent (no doses missed/week)      [x]  Good (1 dose missed/week)      []  Partial (2-3 doses missed/week)      []  Poor (>3 doses missed/week)  Barriers to Obtaining Medications: [x]  Yes []  No  Nicole [name]  expressed concern with obtaining medication, Keppra and Vimpat.  Pt states that has no insurance and uses pharmacy discount card.  Pt states case management has been working on [employer] Instructed pt to call Family Medicine if is unable to afford medications upon discharge.   Assessment: Pt states  case management has been working on [employer] Instructed pt to call Family Medicine if is unable to afford medications upon discharge.  Counseled pt on dose changes and new medications (Fluoxetine, Keppra, Vimpat, Loratadine, Topamax), possible SE, and importance of adherence to her regimen.  Pt demonstrated understanding and had no barriers to adherence.    Time spent preparing for discharge counseling: 15 min Time spent counseling patient: 15 min 10/22/2015, 4:38 PM Bennye Alm, PharmD Pharmacy Resident [phone]

## 2015-10-22 NOTE — Progress Notes (Signed)
Interim Progress Note: The plan for the day as discussed during rounds was to have Ms.  evaluated by vestibular PT before sending her home. This assessment was never done today, so I spoke with Nicole [name] about potentially sending her home today vs keeping her in the hospital until she has the vestibular PT assessment performed. Our team and Ms.  both feel that it is safer to keep her in the hospital until this assessment is done, so that we can decrease her risk of becoming dizzy at home and having a fall. Hopefully this assessment will be performed over the weekend. We are also awaiting social work to set her up with outpatient psych for therapy.  Hyman Bible, MD PGY-1

## 2015-10-23 DIAGNOSIS — F4321 Adjustment disorder with depressed mood: Secondary | ICD-10-CM

## 2015-10-23 MED ORDER — FLUOXETINE HCL 20 MG PO CAPS: 20.0000 mg | ORAL_CAPSULE | Freq: Every day | ORAL | Status: DC

## 2015-10-23 MED ORDER — LACOSAMIDE 100 MG PO TABS: 100.0000 mg | ORAL_TABLET | Freq: Two times a day (BID) | ORAL | Status: DC

## 2015-10-23 NOTE — Progress Notes (Signed)
Family Medicine Teaching Service Daily Progress Note Intern Pager: [phone]  Patient name: Nicole [name] Medical record number: [id number] Date of birth: [birth year] Age: 39 y.o. Gender: female  Primary Care Provider: Tawanna Sat, MD Consultants: Neurology  Code Status: Full  Pt Overview and Major Events to Date:   Assessment and Plan: Nicole [name] is a 39 y.o. female presenting with possibly seizure. PMH is significant for Anemia, Asthma, Seizures, Sickle Cell trait    Possible seizure:  Hx of seizure. Take keppra. Per patient neighbor witnessed patient's seizure, however do not have any details of the event from patient or her husband. CT head was negative for any acute process. UDS was negative for any drugs. Electrolytes were wnl.  - Neuro recs for outpatient sleep study, will f/u in 2 weeks  - Continue Keppra at lower dose and now Vimpat - EEG neg, MRI no acute process  - TSH and Sed Rate wnl  - Keppra level - 10  - PT says no true vestibular disorder, HHPT for strength/mobility - Psychiatry states - outpatient psychiatry/counseling; [facility] does not do depressive psychotherapy which is where she wanted to go; SW consult in place to refer pt  Syncopal Episodes: Hx of syncopal episodes in the past week. Very likely related to general weakness, similar to prior admission in Dec 2015. Possibly due to medications (valuim and klonopin) vs. Orthostatic changes. Patient without significant heart hx. Normal ECHO in 2015. However risk factors include family hx of early MI (mother 33 and grandmother 46). Negative I-stat troponin. EKG with normal sinus rhythm. No changes on telemetry. Not likely cardiac in nature  -Patient orthostatics- normal   FEN/GI: Regular diet  Prophylaxis: Lovenox   Disposition: Telemetry, disposition pending further work up and PT consults.   Subjective:  Patient doing well this morning. She states that she feels ready to go home today with current  plan.  Objective: Temp:  [98.2 F (36.8 C)-98.7 F (37.1 C)] 98.7 F (37.1 C) (11/12 0551) Pulse Rate:  [75-94] 75 (11/12 0551) Resp:  [15-19] 18 (11/12 0551) BP: (98-107)/(59-77) 98/61 mmHg (11/12 0551) SpO2:  [97 %-99 %] 97 % (11/12 0551) Physical Exam: General: Patient lying in bed, NAD  Cardiovascular: RRR, no murmur, rubs, gallops  Respiratory: CTAB, no wheezes, rhonchi  Abdomen: BS+, no ttp, no rebound  Neuro: ptosis again noted this AM, neuro exam was significant for 4/5 weakness throughout on exam, however not able to determine if this is due to effort or true weakness  Psych exam: depressed mood   Laboratory:  Recent Labs Lab 10/19/15 2152 10/19/15 2157  WBC 6.8  --   HGB 13.4 14.6  HCT 39.6 43.0  PLT 261  --     Recent Labs Lab 10/19/15 2152 10/19/15 2157 10/21/15 0622  NA 138 141 137  K 3.9 3.9 3.7  CL 111 109 111  CO2 18*  --  18*  BUN 12 13 10   CREATININE 0.84 0.80 0.88  CALCIUM 9.3  --  8.8*  PROT 6.5  --   --   BILITOT 0.6  --   --   ALKPHOS 79  --   --   ALT 14  --   --   AST 15  --   --   GLUCOSE 97 91 85   EEG- negative for seizure like changes   Imaging/Diagnostic Tests: No results found.  Frazier Richards, MD 10/23/2015, 8:44 AM PGY-3, [place] Intern pager: [phone], text  pages welcome

## 2015-10-23 NOTE — Progress Notes (Signed)
Pt given d/c instructions and prescriptions. Understands to f/u with scheduled appts. D/c home with family.

## 2015-10-24 LAB — LEVETIRACETAM LEVEL: LEVETIRACETAM: 126 ug/mL — AB (ref 10.0–40.0)

## 2015-10-25 ENCOUNTER — Telehealth: Payer: Self-pay | Admitting: Family Medicine

## 2015-10-25 NOTE — Telephone Encounter (Signed)
Attempted phone call to patient regarding elevated keppra level, unable to leave voicemail. This was decreased during hospital stay and discharge. It should likely be repeated on her hospital follow up to determine it is still not elevated (half life ~7 hours).  Hosp follow up is in 3 days, since I was unable to leave a VM will send a message to the provider she is seeing.

## 2015-10-25 NOTE — Telephone Encounter (Signed)
-----   Message from Zenia Resides, MD sent at 10/25/2015  4:02 PM EST ----- Mitzi Hansen, Note this very high lamictal level on patient in hospital.  I suspect it explains her symptoms.  Please contact her. ----- Message -----    From: Lab In [place]: 10/24/2015   1:35 AM      To: Zenia Resides, MD

## 2015-10-28 ENCOUNTER — Ambulatory Visit (INDEPENDENT_AMBULATORY_CARE_PROVIDER_SITE_OTHER): Payer: Self-pay | Admitting: Student

## 2015-10-28 ENCOUNTER — Encounter: Payer: Self-pay | Admitting: Student

## 2015-10-28 VITALS — BP 107/76 | HR 89 | Temp 98.1°F | Ht 65.0 in | Wt 239.0 lb

## 2015-10-28 DIAGNOSIS — F4321 Adjustment disorder with depressed mood: Secondary | ICD-10-CM

## 2015-10-28 DIAGNOSIS — R569 Unspecified convulsions: Secondary | ICD-10-CM

## 2015-10-28 DIAGNOSIS — R251 Tremor, unspecified: Secondary | ICD-10-CM

## 2015-10-28 DIAGNOSIS — R5383 Other fatigue: Secondary | ICD-10-CM | POA: Insufficient documentation

## 2015-10-28 MED ORDER — FLUOXETINE HCL 20 MG PO CAPS: 20.0000 mg | ORAL_CAPSULE | Freq: Every day | ORAL | Status: DC

## 2015-10-28 NOTE — Assessment & Plan Note (Signed)
Stable, however concern that her mood may have been contributing to her seizure like activity - referral to behavioral health today - script for prozac given as it can be filled at [facility] for $4

## 2015-10-28 NOTE — Patient Instructions (Signed)
Follow up in 1 month You were given a prescription for Fluoxetine. Please take it daily You were also given the contact information for Dr Gwenlyn Saran, [facility], please contact her as soon as possible Please also obtain your Vimpat as soon as your medicaid comes through. If you have any issues with this process, please call the office to talk about alternative programs to help with medications If you have any questions or concerns please call the office at [phone]

## 2015-10-28 NOTE — Progress Notes (Signed)
Subjective:    Patient ID: Nicole [name], female    DOB: [birth year], 39 y.o.   MRN: [id number]   CC:  Hospital follow up  HPI 39 y/o admitted on 11/8 for concern for seizure activity and found to have an elevated level of keppra  Seizure activity - has not had seizure activity since discharge - states she has continued to feel tired but exercise helps - She was started on Prozac in the hospital but has not been able to get it because she does not have insurance. - she was additionaly started on Vimpat but has not been able to get it either - She has applied for medicaid and expects it to go into effect in December - Has yet to make an appointment with neuro as her medicaid has yet to come through - Reports her mood has been " good" and has not had mood issues   Review of Systems   See HPI for ROS.   Past Medical History  Diagnosis Date  . Anemia   . Asthma   . Blood transfusion 2011    r/t anemia  . Migraine   . Sickle cell trait ([place])   . Seizures [facility])     last sz Jan 2012  . Seizures ([place])   . Fibroid   . Tooth abscess   . Sickle cell trait ([place])   . Migraine    Past Surgical History  Procedure Laterality Date  . Tubal ligation Bilateral 2005   OB History    Gravida Para Term Preterm AB TAB SAB Ectopic Multiple Living   3 2 1 1 1  0 0 1 0 2     Social History   Social History  . Marital Status: Married    Spouse Name: N/A  . Number of Children: N/A  . Years of Education: N/A   Occupational History  . Not on file.   Social History Main Topics  . Smoking status: Former Smoker    Quit date: 03/27/2014  . Smokeless tobacco: Never Used  . Alcohol Use: No  . Drug Use: No  . Sexual Activity:    Partners: Male    Birth Control/ Protection: Condom, Injection, Surgical   Other Topics Concern  . Not on file   Social History Narrative    Objective:  BP 107/76 mmHg  Pulse 89  Temp(Src) 98.1 F (36.7 C) (Oral)  Ht 5\' 5"  (1.651 m)  Wt 239 lb  (108.41 kg)  BMI 39.77 kg/m2 Vitals and nursing note reviewed  General: NAD Cardiac: RRR,  Respiratory: CTAB,  Abdomen: obese, soft, nontender, Neuro: alert and oriented, no focal deficits Psych: flat affect    Assessment & Plan:    Seizure ([place]) Stable - Keppra level ordered but pt left prior to it being obtained - She was called and asked to return for a lab visit as soon as possible and she stated she would - pt unable to afford Vimpat, spoke to SW who stated that if medicaid comes through would be the best option for her in terms of cost of the medication vs orange card - pt counseled to contact us if any further delay in getting medicaid - Pt has not yet made an appointment with neuro- She was encouraged to contact them for follow up  Adjustment disorder with depressed mood Stable, however concern that her mood may have been contributing to her seizure like activity - referral to behavioral health today - script for prozac given  as it can be filled at [facility] for $4   Fatigue Potentially secondary to depression but as she is overweight should rule out OSA, last TSH normal at1.9 on 11/9 - sleep study ordered today     Maleea Camilo A. Lincoln Brigham MD, [facility] Resident PGY-1 Pager [phone]

## 2015-10-28 NOTE — Assessment & Plan Note (Signed)
Potentially secondary to depression but as she is overweight should rule out OSA, last TSH normal at1.9 on 11/9 - sleep study ordered today

## 2015-10-28 NOTE — Assessment & Plan Note (Addendum)
Stable - Keppra level ordered but pt left prior to it being obtained - She was called and asked to return for a lab visit as soon as possible and she stated she would - pt unable to afford Vimpat, spoke to SW who stated that if medicaid comes through would be the best option for her in terms of cost of the medication vs orange card - pt counseled to contact us if any further delay in getting medicaid - Pt has not yet made an appointment with neuro- She was encouraged to contact them for follow up

## 2015-10-29 ENCOUNTER — Other Ambulatory Visit: Payer: Self-pay

## 2015-11-01 IMAGING — CT CT ABD-PELV W/ CM
1 of 2 series · 15 of 32 positions shown, 19 images · IV contrast (OMNIPAQUE 300)
Comparison: Pelvic ultrasound earlier today.

CLINICAL DATA: Right lower quadrant abdominal pain for 2 weeks.

EXAM:
CT ABDOMEN AND PELVIS WITH CONTRAST
TECHNIQUE: Multidetector CT imaging of the abdomen and pelvis was performed
using the standard protocol following bolus administration of
intravenous contrast.
CONTRAST:  100mL OMNIPAQUE IOHEXOL 300 MG/ML  SOLN

[Series 2: abd/pel with · axial · 0.74mm/px · z∈[-394,+16]mm · 15 of 90 slices shown, 19 images]
[im 4/90  soft-tissue]
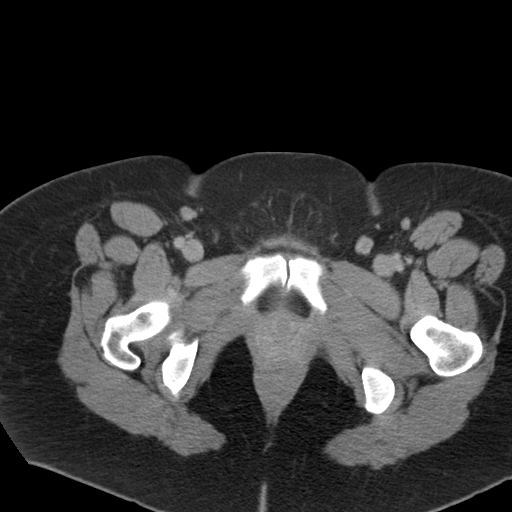
[im 4/90  bone]
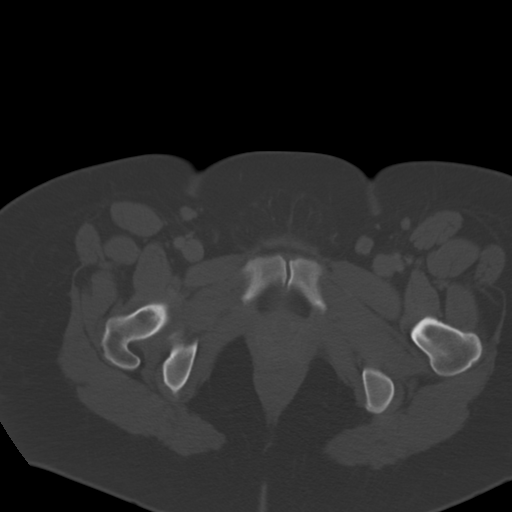
[im 11/90  soft-tissue]
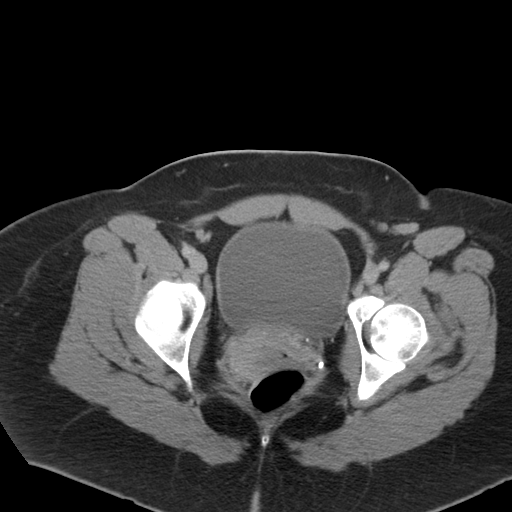
[im 18/90  soft-tissue]
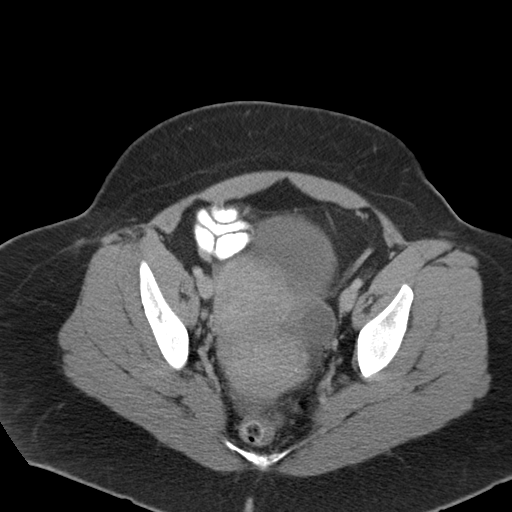
[im 25/90  soft-tissue]
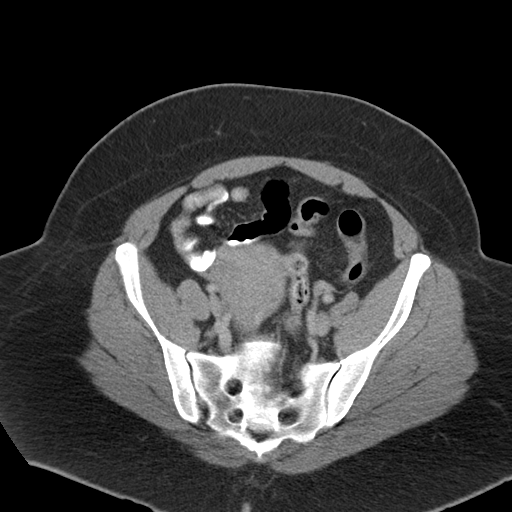
[im 33/90  soft-tissue]
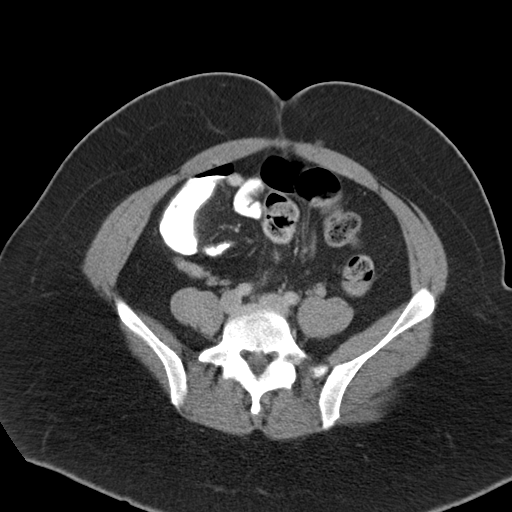
[im 40/90  soft-tissue]
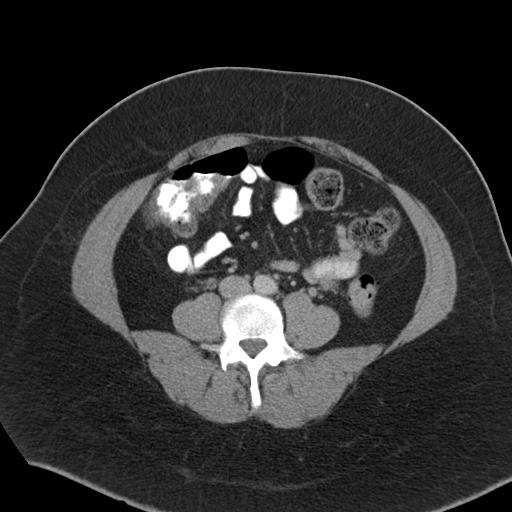
[im 47/90  soft-tissue]
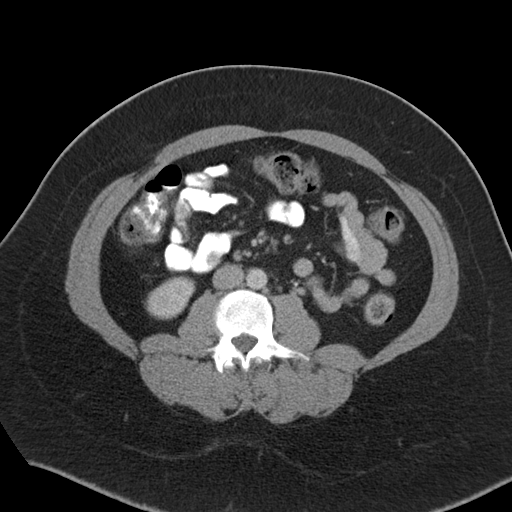
[im 50/90  soft-tissue]
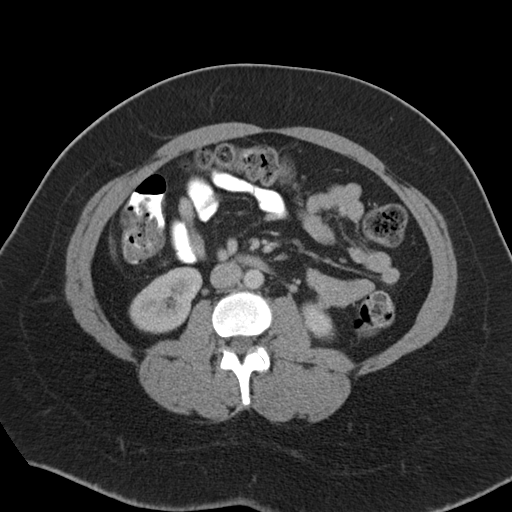
[im 57/90  soft-tissue]
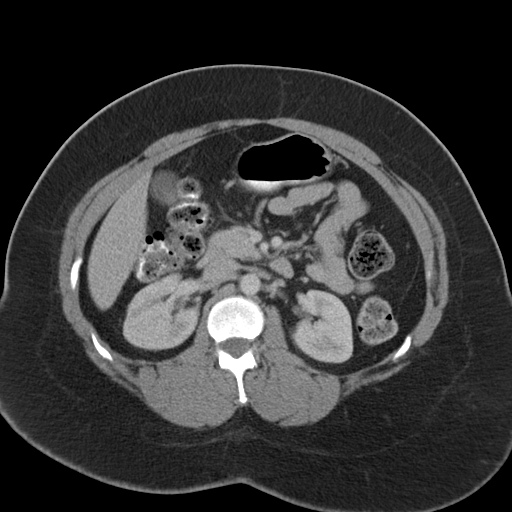
[im 57/90  bone]
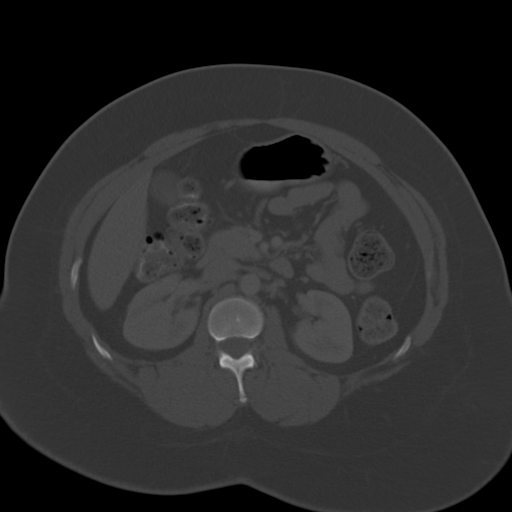
[im 65/90  soft-tissue]
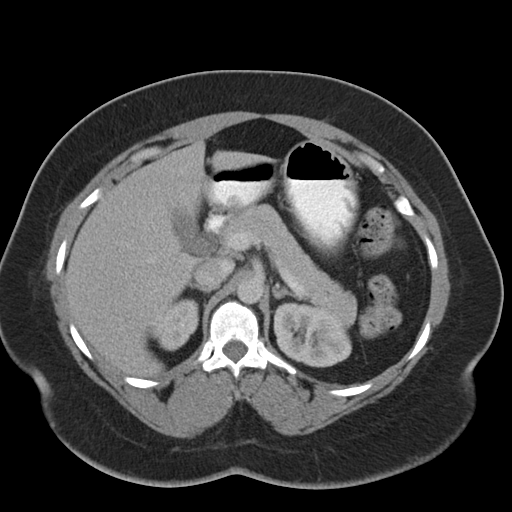
[im 72/90  soft-tissue]
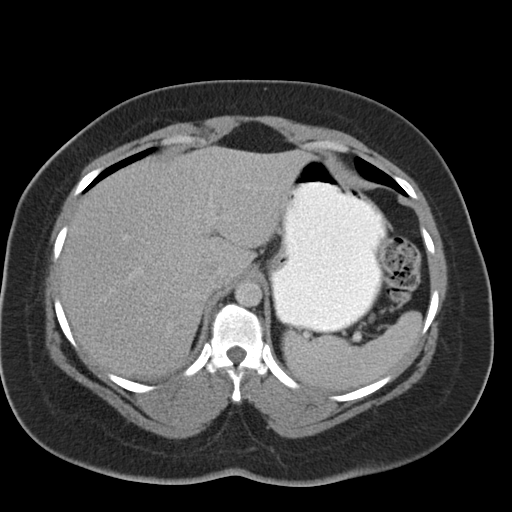
[im 75/90  lung]
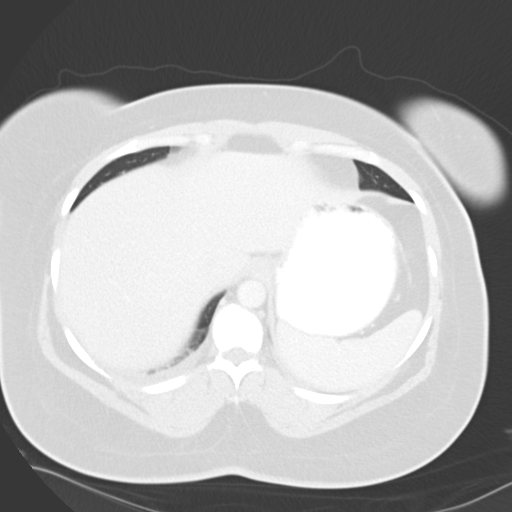
[im 79/90  soft-tissue]
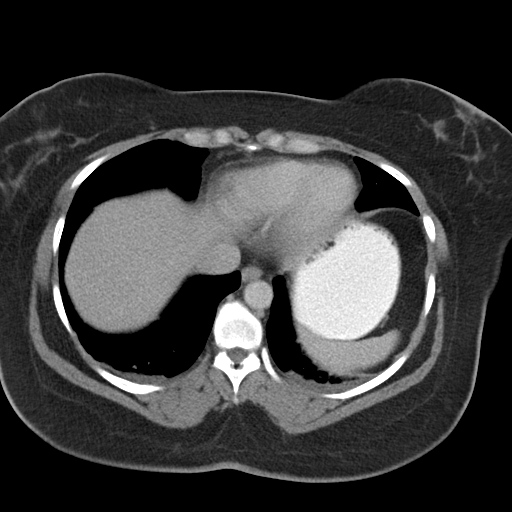
[im 79/90  lung]
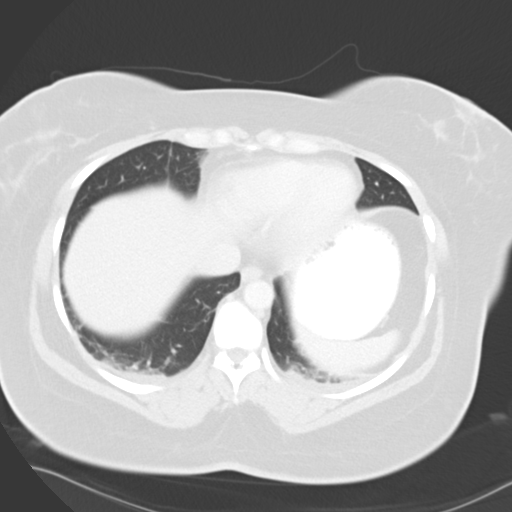
[im 82/90  lung]
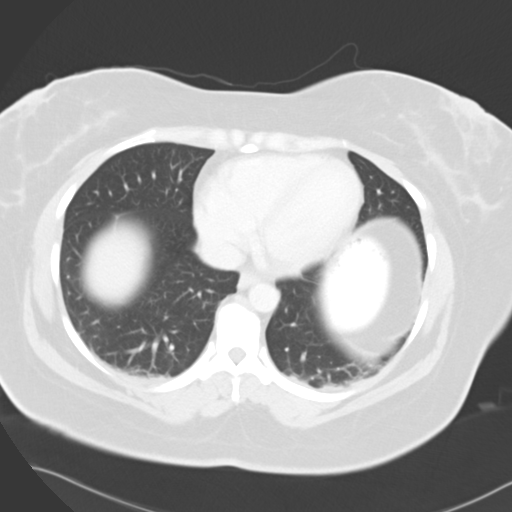
[im 86/90  soft-tissue]
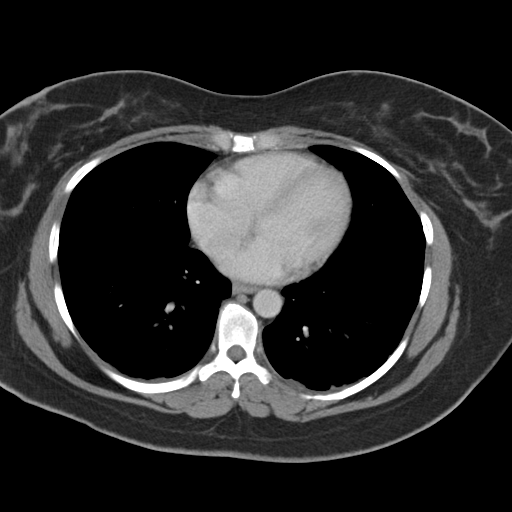
[im 86/90  lung]
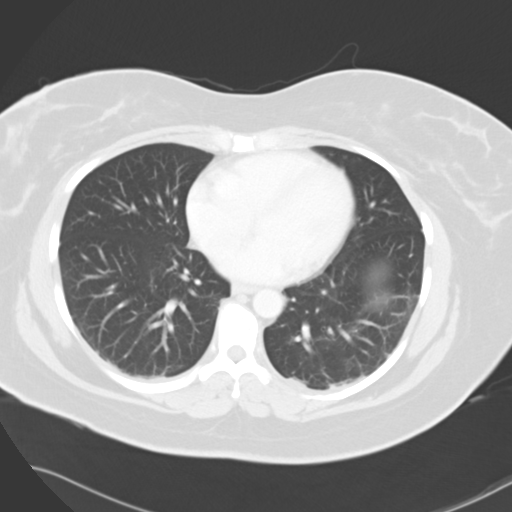

[15 of 32 positions shown; findings below may reference images not displayed]

FINDINGS: Dependent atelectasis in the lung bases. No pleural effusions. Heart
is normal size.

Liver, gallbladder, spleen, pancreas, adrenals and kidneys are
unremarkable.

Appendix is visualized and is normal. Stomach, large and small bowel
unremarkable. Exophytic/subserosal fibroid off the left anterior
uterine fundus. Small cyst or dominant follicle in the left ovary as
seen on ultrasound. This measures 2.6 cm by CT. Trace free fluid in
the cul-de-sac. No adnexal masses. No free air or adenopathy. Aorta
is normal caliber.

No acute bony abnormality or focal bone lesion.
IMPRESSION: Normal appendix.

Small left ovarian cyst or dominant follicle as seen on prior
ultrasound. Trace free fluid in the pelvis.

Dependent atelectasis in the lung bases.

## 2015-11-01 IMAGING — US US PELVIS COMPLETE
1 series · 13 of 25 positions shown · non-contrast
Comparison: 02/06/2014

CLINICAL DATA: Abdominal pain

EXAM:
TRANSABDOMINAL AND TRANSVAGINAL ULTRASOUND OF PELVIS
TECHNIQUE: Both transabdominal and transvaginal ultrasound examinations of the
pelvis were performed. Transabdominal technique was performed for
global imaging of the pelvis including uterus, ovaries, adnexal
regions, and pelvic cul-de-sac. It was necessary to proceed with
endovaginal exam following the transabdominal exam to visualize the
endometrium and LEFT ovary.

[Series 1: us pelvis complete · 0.24mm/px · 81 acquisitions, 13 frames shown]
[im 1/81]
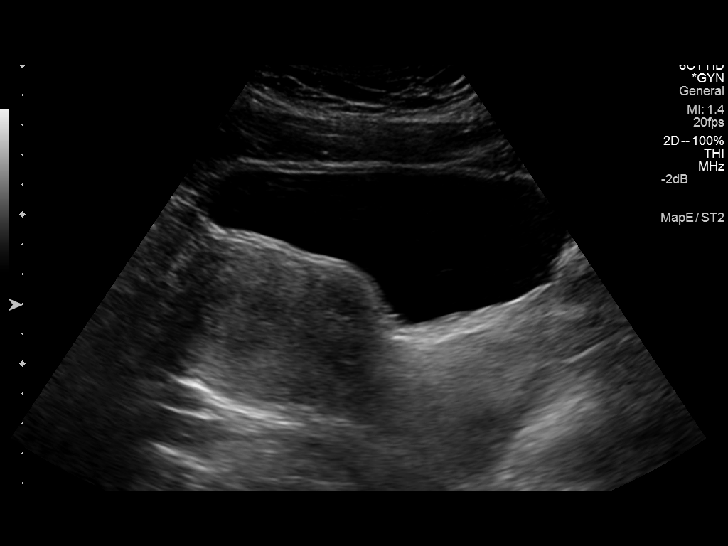
[im 7/81]
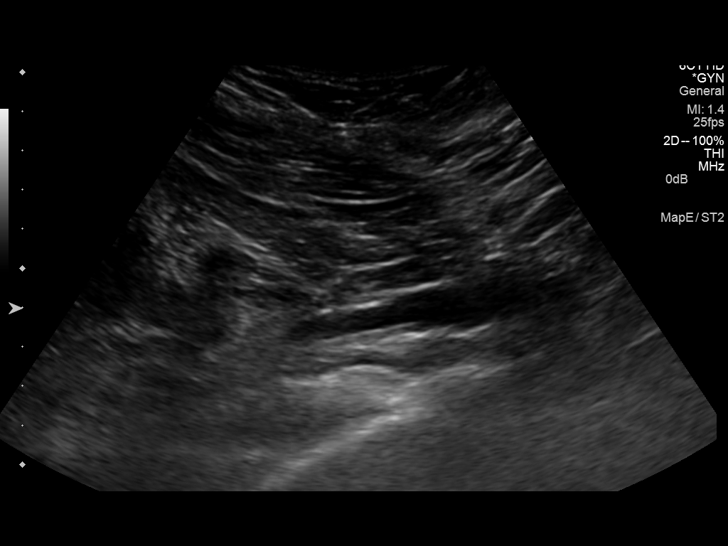
[im 14/81]
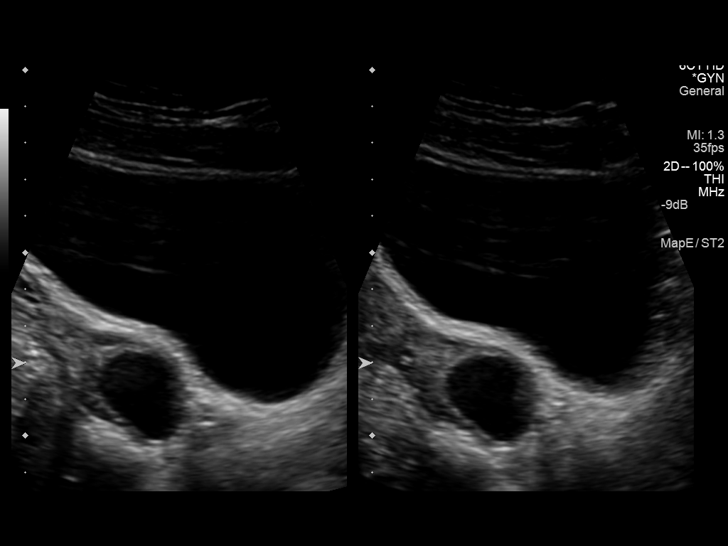
[im 21/81]
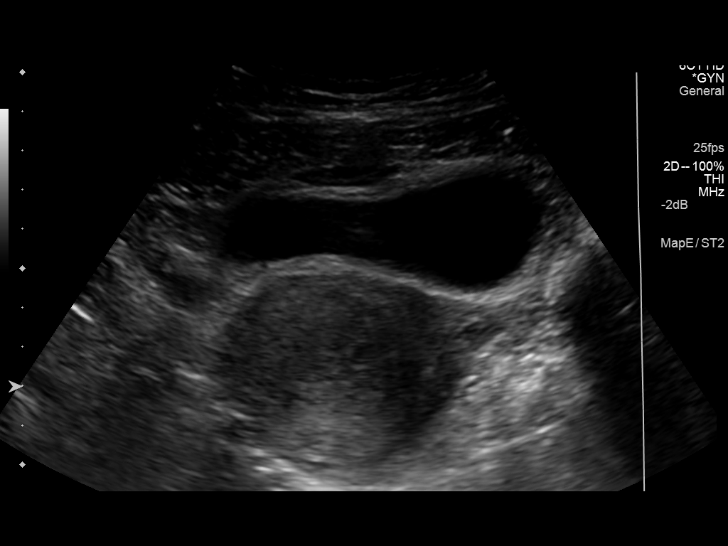
[im 27/81]
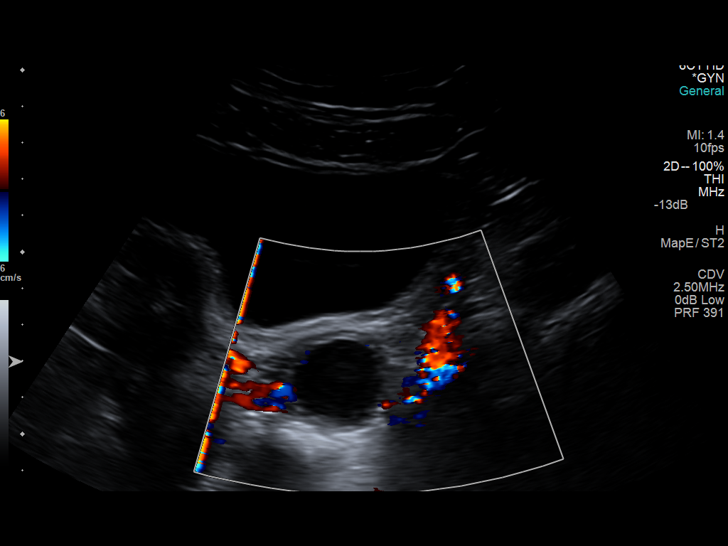
[im 34/81]
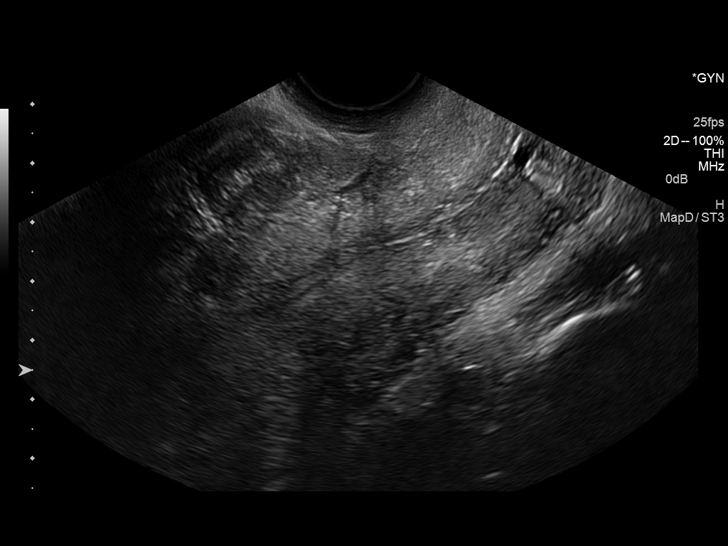
[im 41/81]
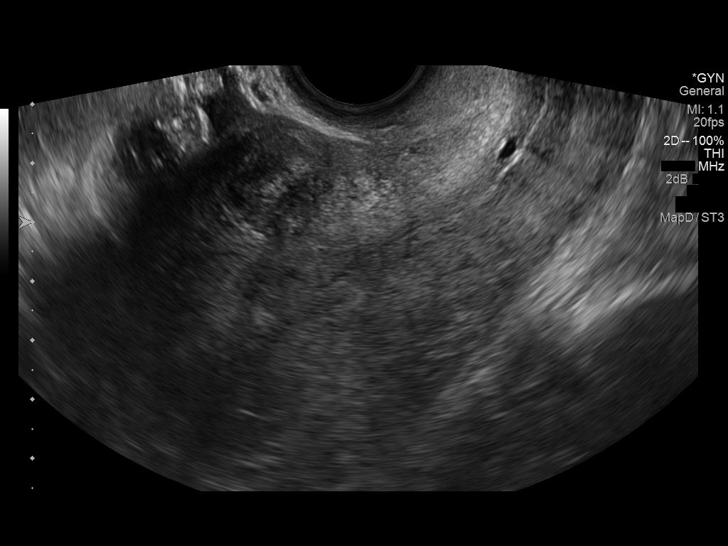
[im 47/81]
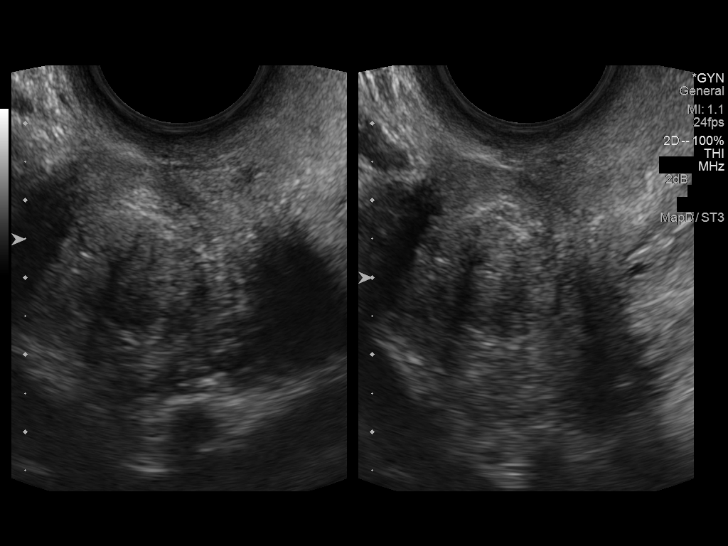
[im 54/81]
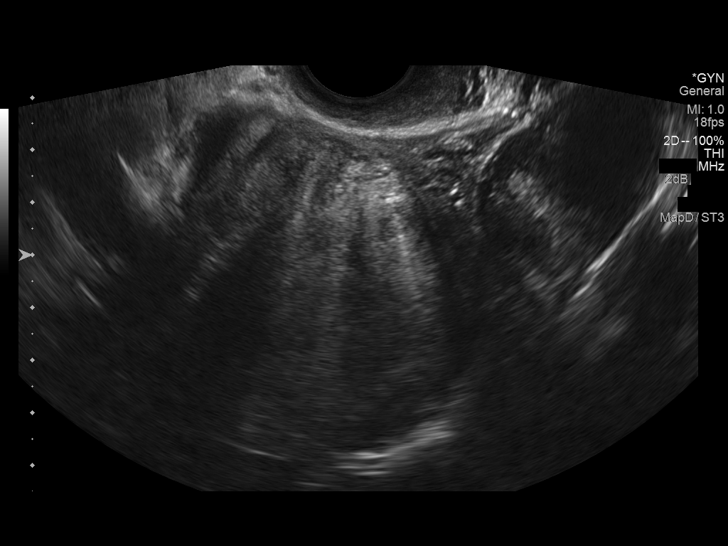
[im 61/81]
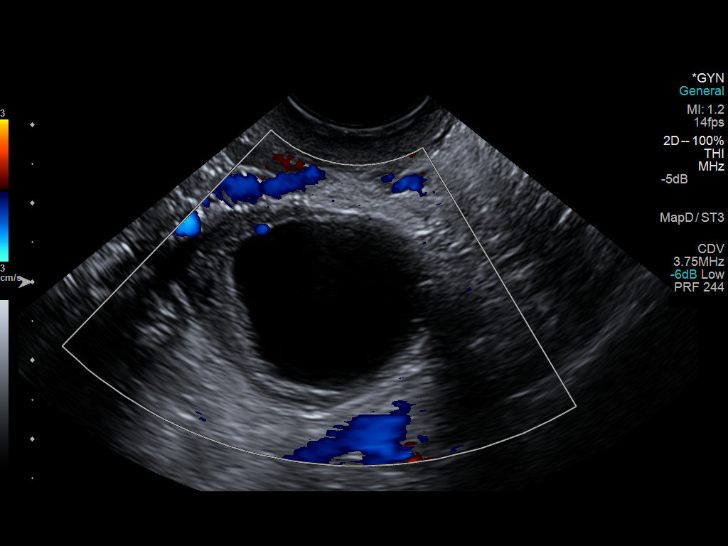
[im 67/81]
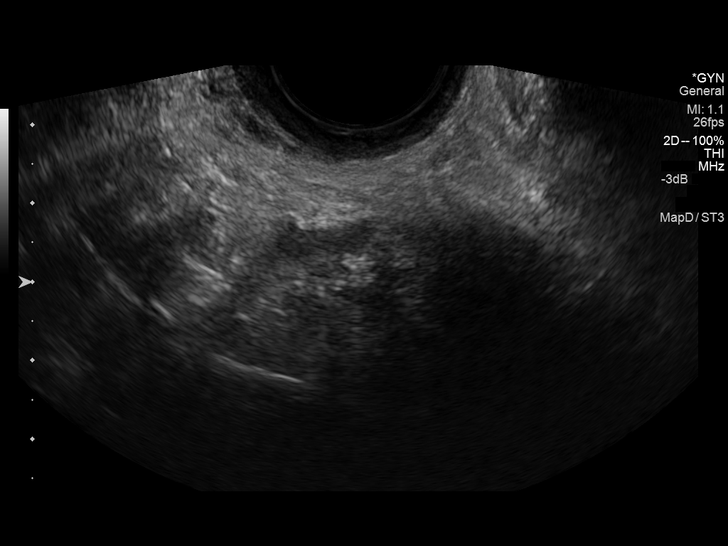
[im 74/81]
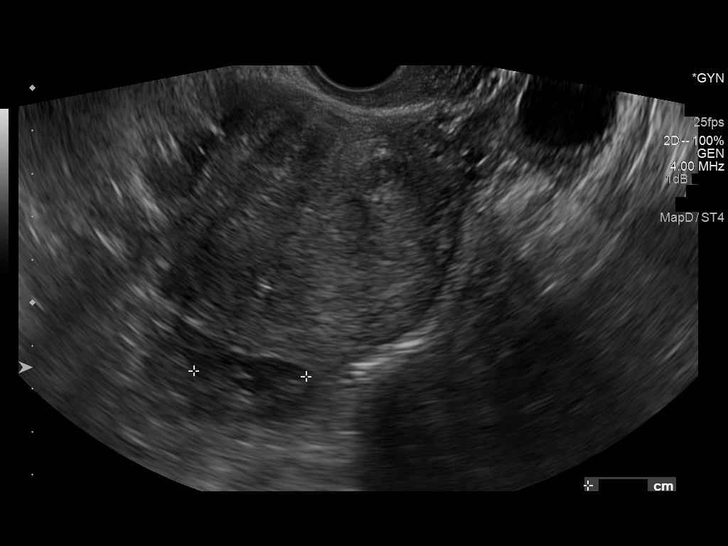
[im 81/81]
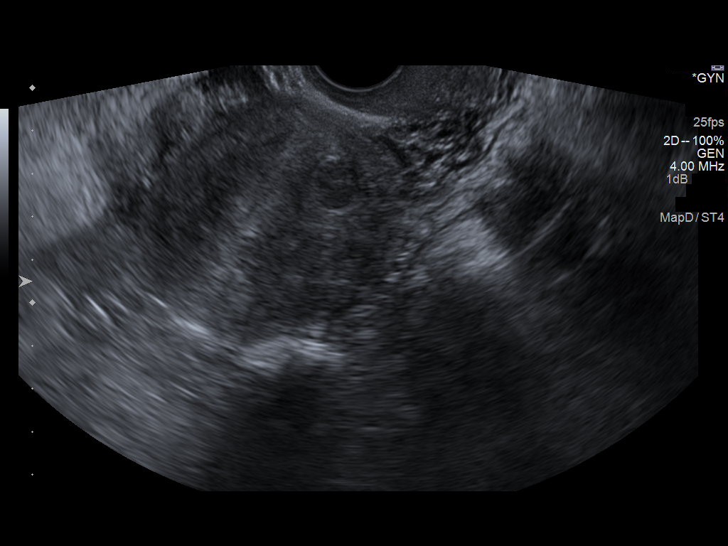

[13 of 25 positions shown; findings below may reference images not displayed]

FINDINGS: Uterus

Measurements: 12.5 x 4.8 x 6.7 cm. Heterogeneous myometrial
appearance. Anterior nodule likely a leiomyoma 2.7 x 1.9 x 2.4 cm.
Additional LEFT lateral nodule likely an additional leiomyoma 2.6 x
2.3 x 2.3 cm.

Endometrium

Thickness: 8 mm thick, poorly delineated.  No endometrial fluid

Right ovary

Measurements: 3.8 x 1.8 x 2.6 cm. Grossly normal morphology without
mass. Less well visualized than LEFT ovary due to position.

Left ovary

Measurements: 3.9 x 2.7 x 3.4 cm. Simple appearing cyst 2.9 x 2.0 x
2.3 cm. No additional masses.

Other findings

Trace free pelvic fluid.  No additional adnexal masses identified.
IMPRESSION: Two probable uterine leiomyomas largest 2.7 cm diameter.

Simple appearing LEFT ovarian cyst 2.9 x 2.0 x 2.3 cm.

## 2015-11-29 ENCOUNTER — Ambulatory Visit: Payer: Self-pay | Admitting: Family Medicine

## 2016-02-05 IMAGING — US US PELVIS COMPLETE
1 series · 13 of 25 positions shown · non-contrast
Comparison: CT of the abdomen and pelvis, and pelvic ultrasound,
performed 03/05/2015

CLINICAL DATA: Acute onset of lower abdominal pain for 1 week,
right greater than left. Initial encounter.

EXAM:
TRANSABDOMINAL AND TRANSVAGINAL ULTRASOUND OF PELVIS
TECHNIQUE: Both transabdominal and transvaginal ultrasound examinations of the
pelvis were performed. Transabdominal technique was performed for
global imaging of the pelvis including uterus, ovaries, adnexal
regions, and pelvic cul-de-sac. It was necessary to proceed with
endovaginal exam following the transabdominal exam to visualize the
uterus and ovaries in greater detail.

[Series 1: us pelvis complete · 0.27mm/px · 13 of 46 slices shown]
[im 1/46]
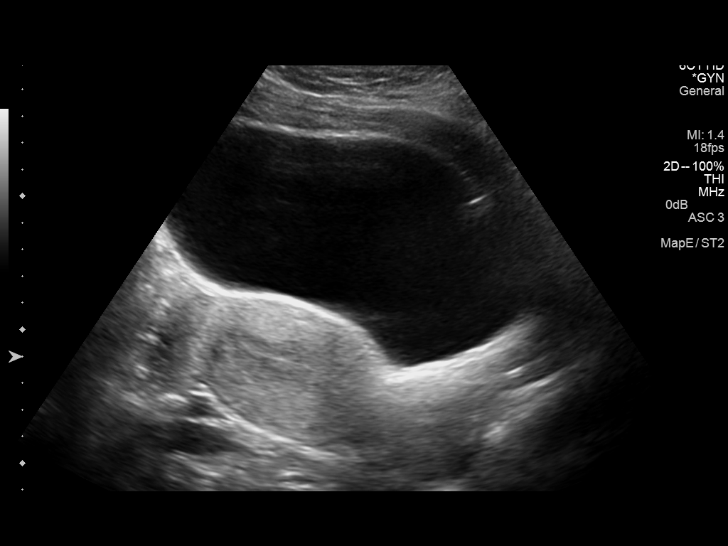
[im 4/46]
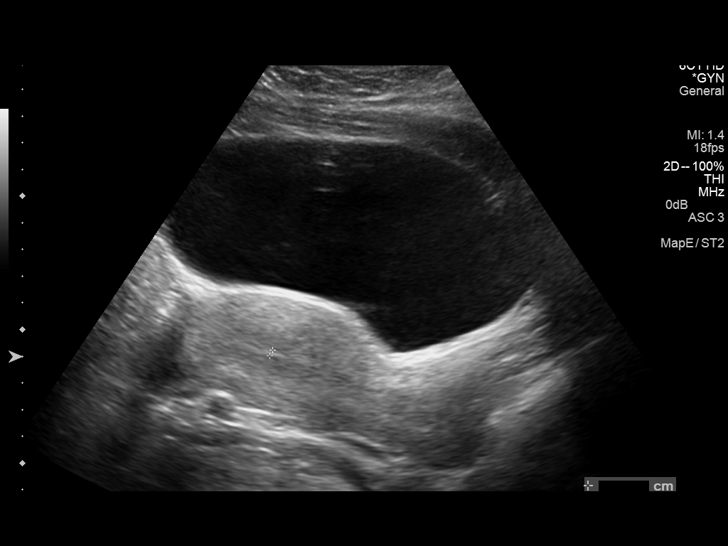
[im 8/46]
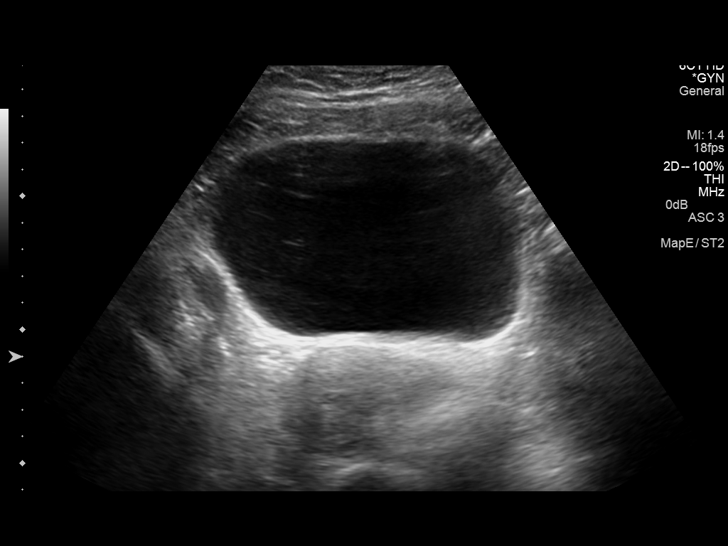
[im 12/46]
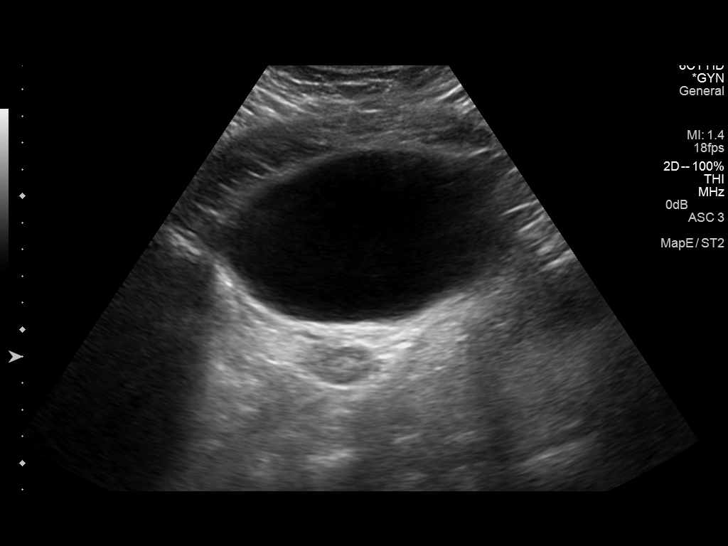
[im 16/46]
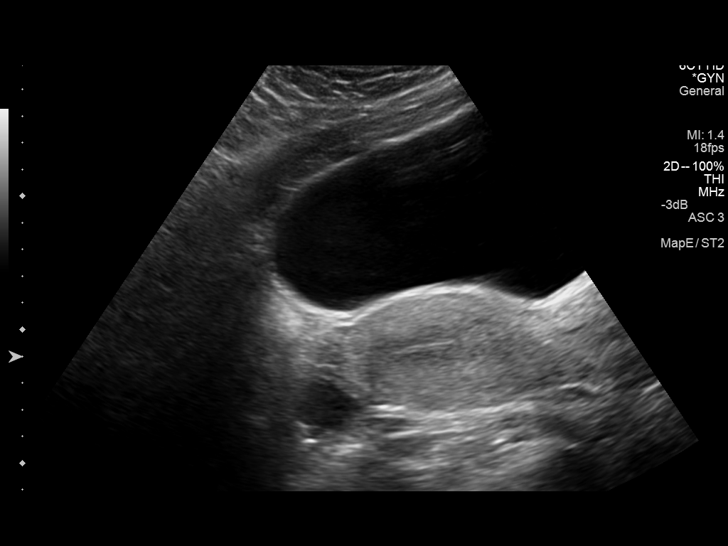
[im 19/46]
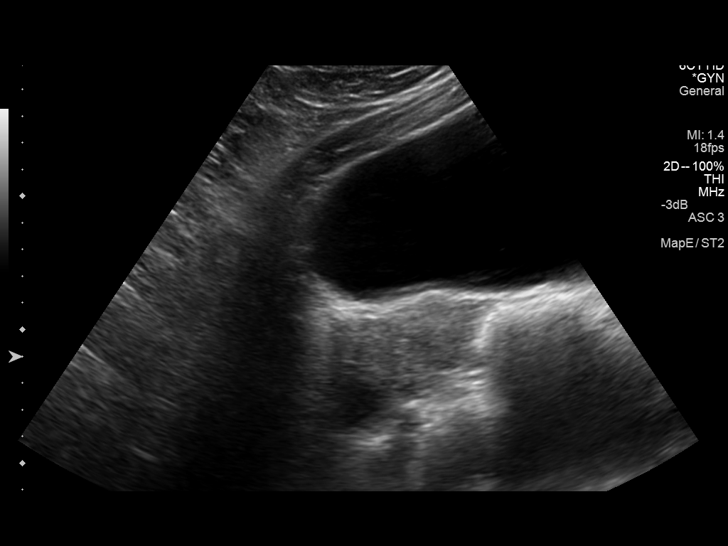
[im 23/46]
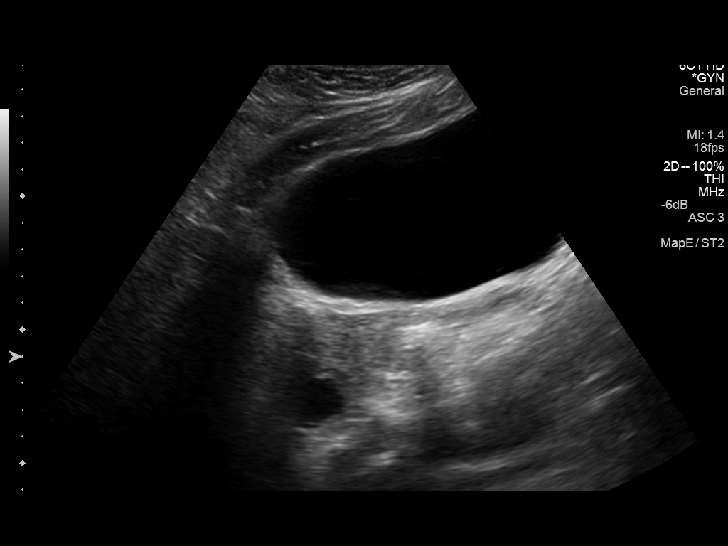
[im 27/46]
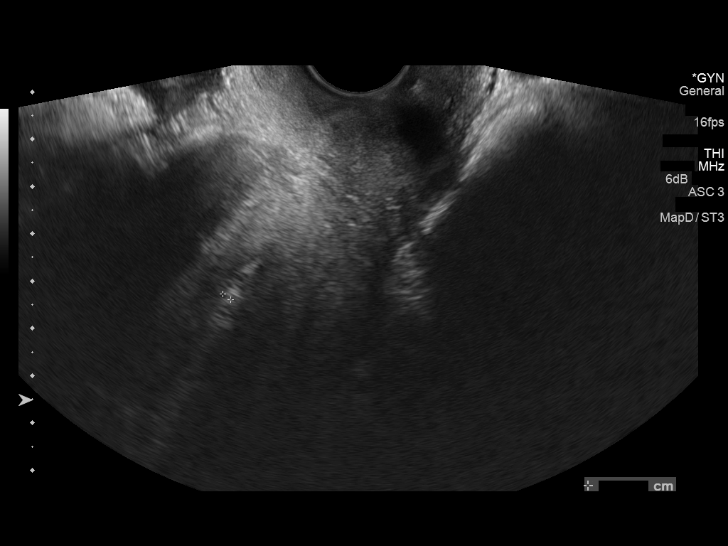
[im 31/46]
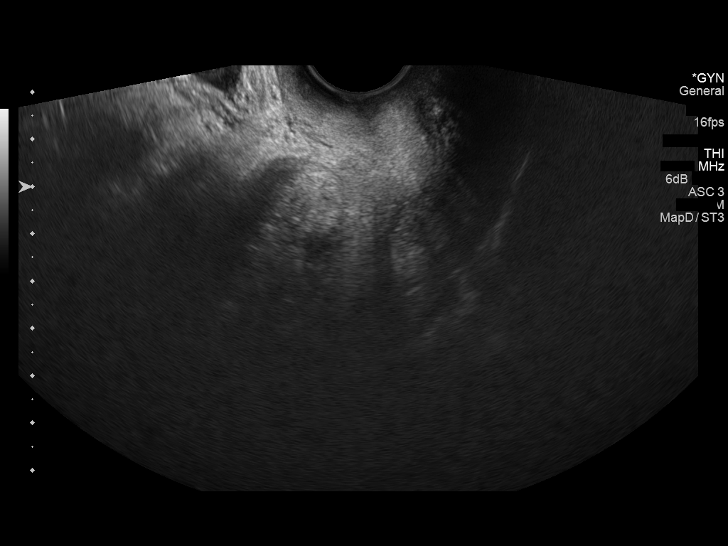
[im 34/46]
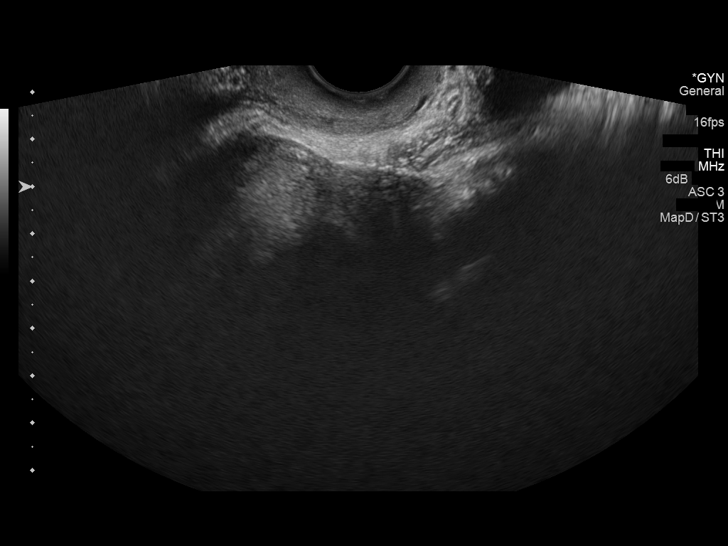
[im 38/46]
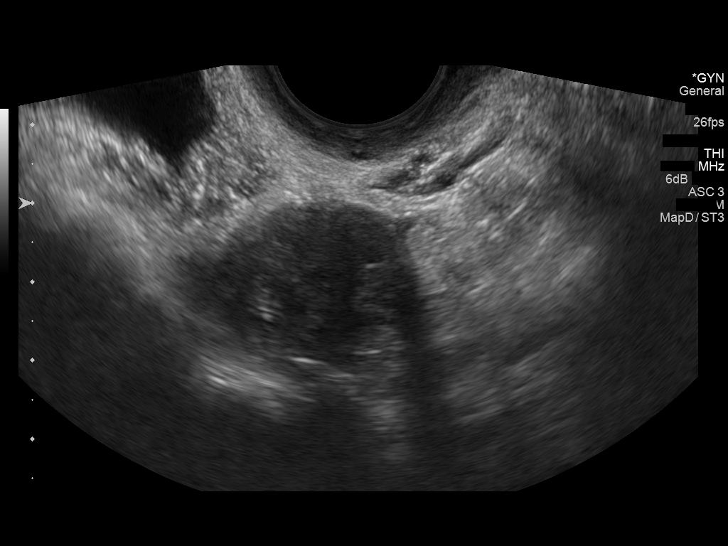
[im 42/46]
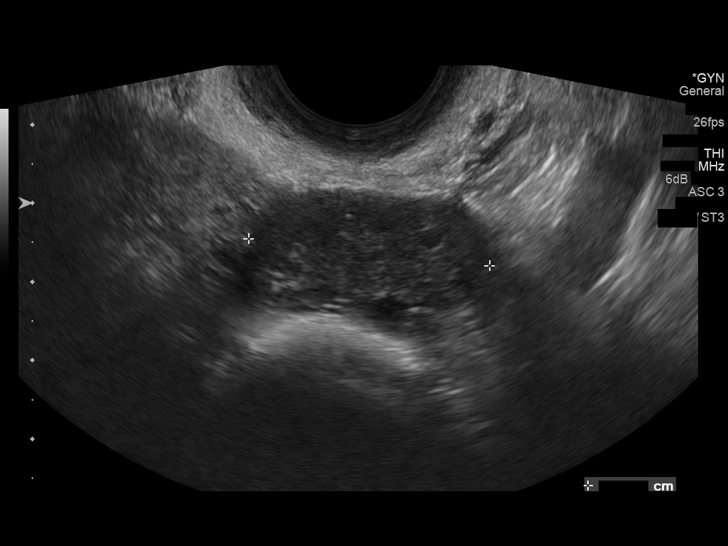
[im 46/46]
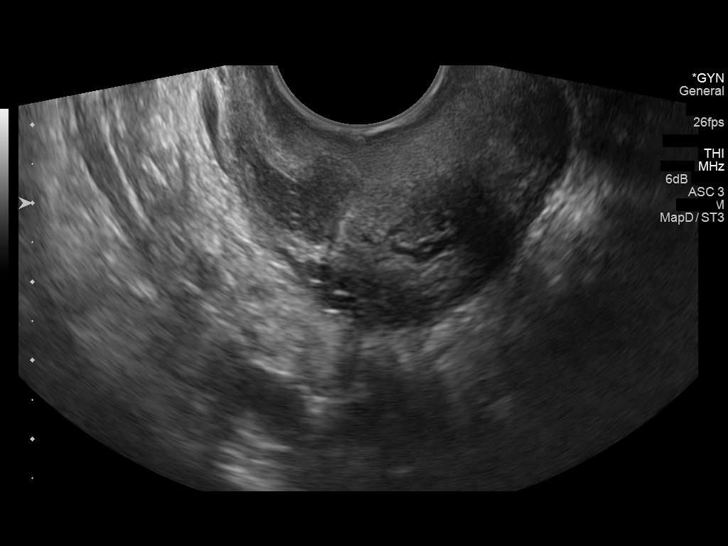

[13 of 25 positions shown; findings below may reference images not displayed]

FINDINGS: Uterus

Measurements: 12.0 x 5.0 x 5.7 cm. A 1.7 x 1.6 x 1.2 cm intramural
fibroid is noted along the anterior aspect of the uterus.

Endometrium

Thickness: 0.3 cm.  No focal abnormality visualized.

Right ovary

Measurements: 3.5 x 2.8 x 3.1 cm. Normal appearance/no adnexal mass.

Left ovary

Measurements: 3.1 x 2.5 x 2.1 cm. Normal appearance/no adnexal mass.

Other findings

No free fluid is seen within the pelvic cul-de-sac.
IMPRESSION: 1. No evidence for ovarian torsion.
2. 1.7 cm intramural fibroid along the anterior aspect of the
uterus. Uterus otherwise unremarkable in appearance.

## 2016-03-06 ENCOUNTER — Other Ambulatory Visit: Payer: Self-pay | Admitting: Internal Medicine

## 2016-03-23 IMAGING — CR DG CHEST 2V
2 series · 2 of 2 positions shown · non-contrast
Comparison: 11/09/2014

CLINICAL DATA: Chest pain

EXAM:
CHEST  2 VIEW

[w chest pa]
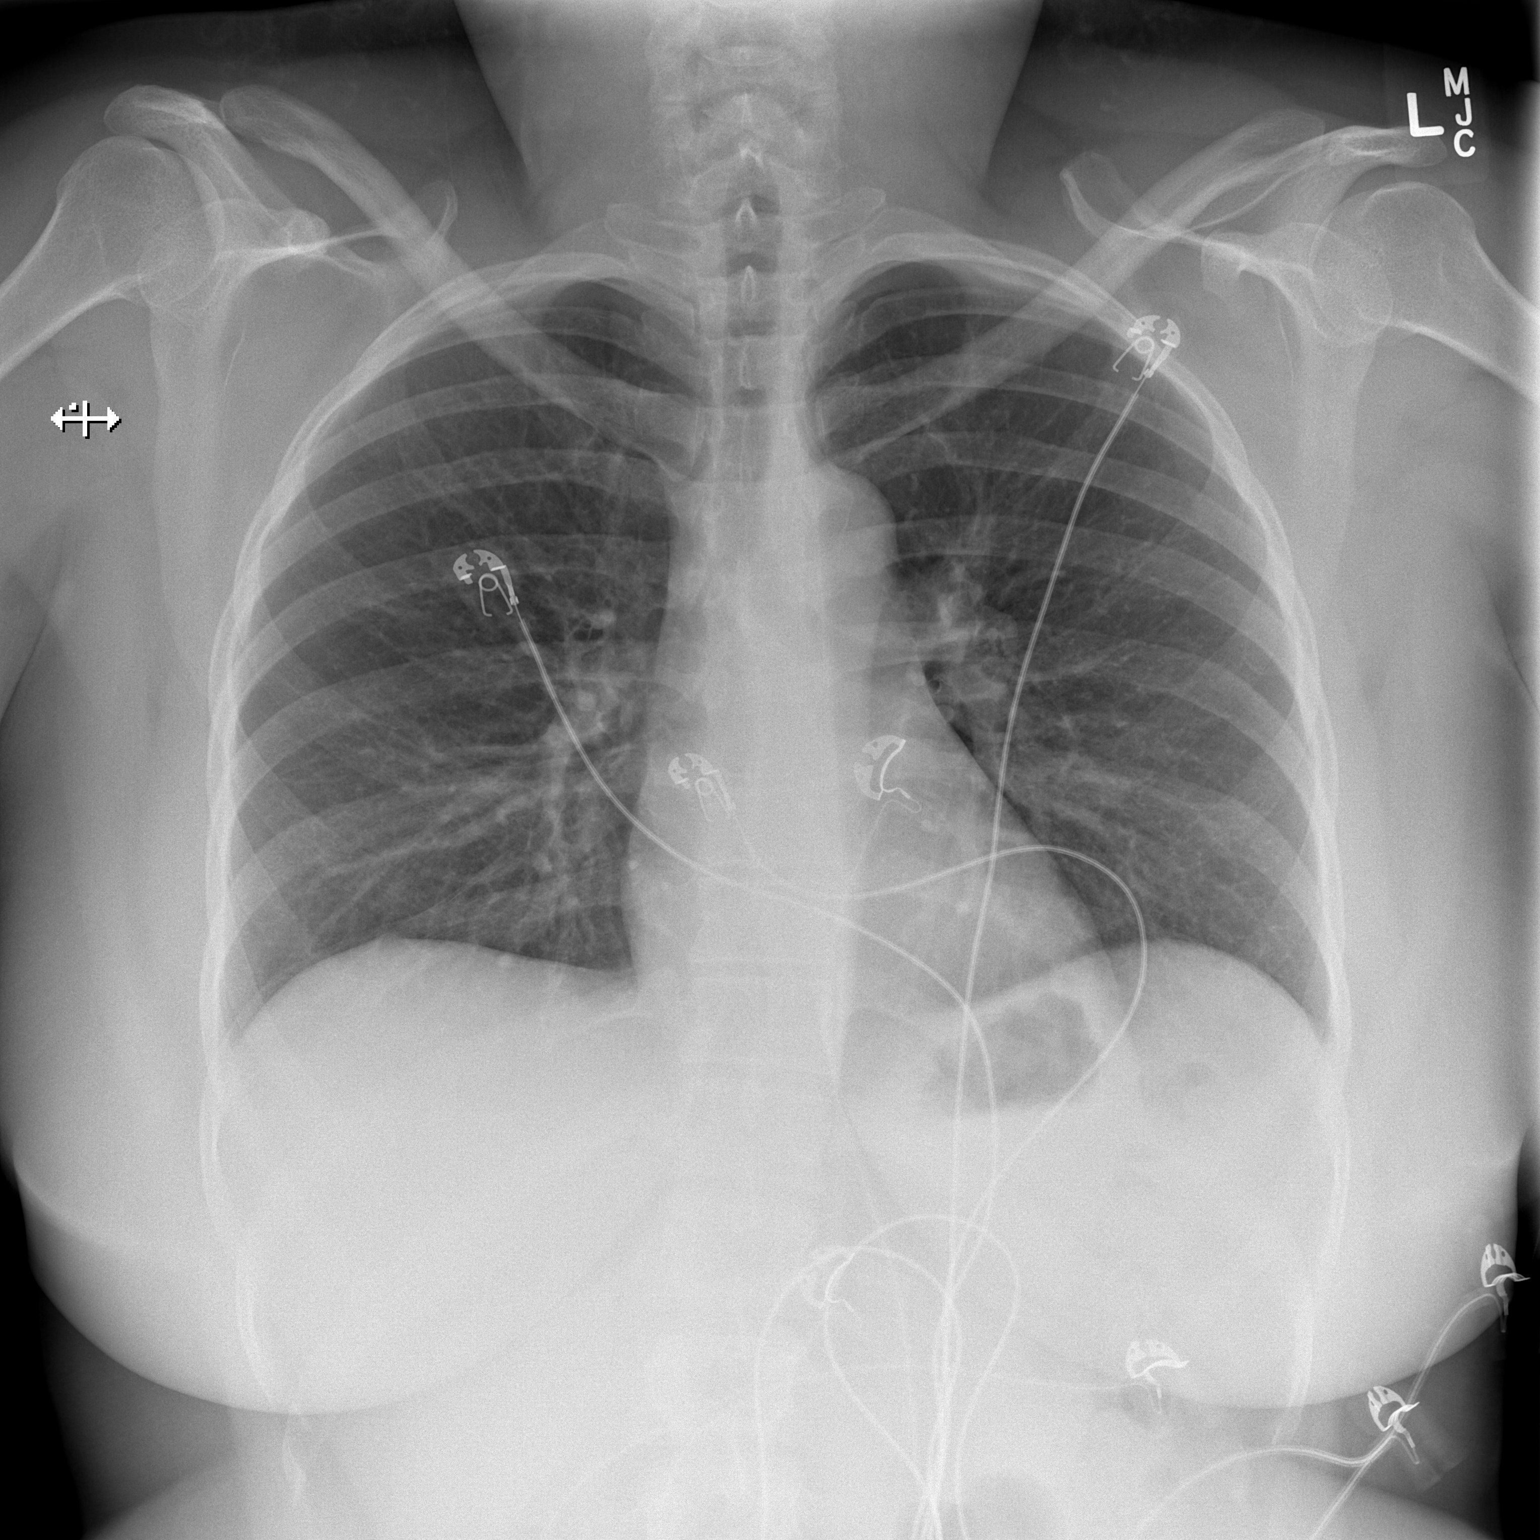

[w chest lat]
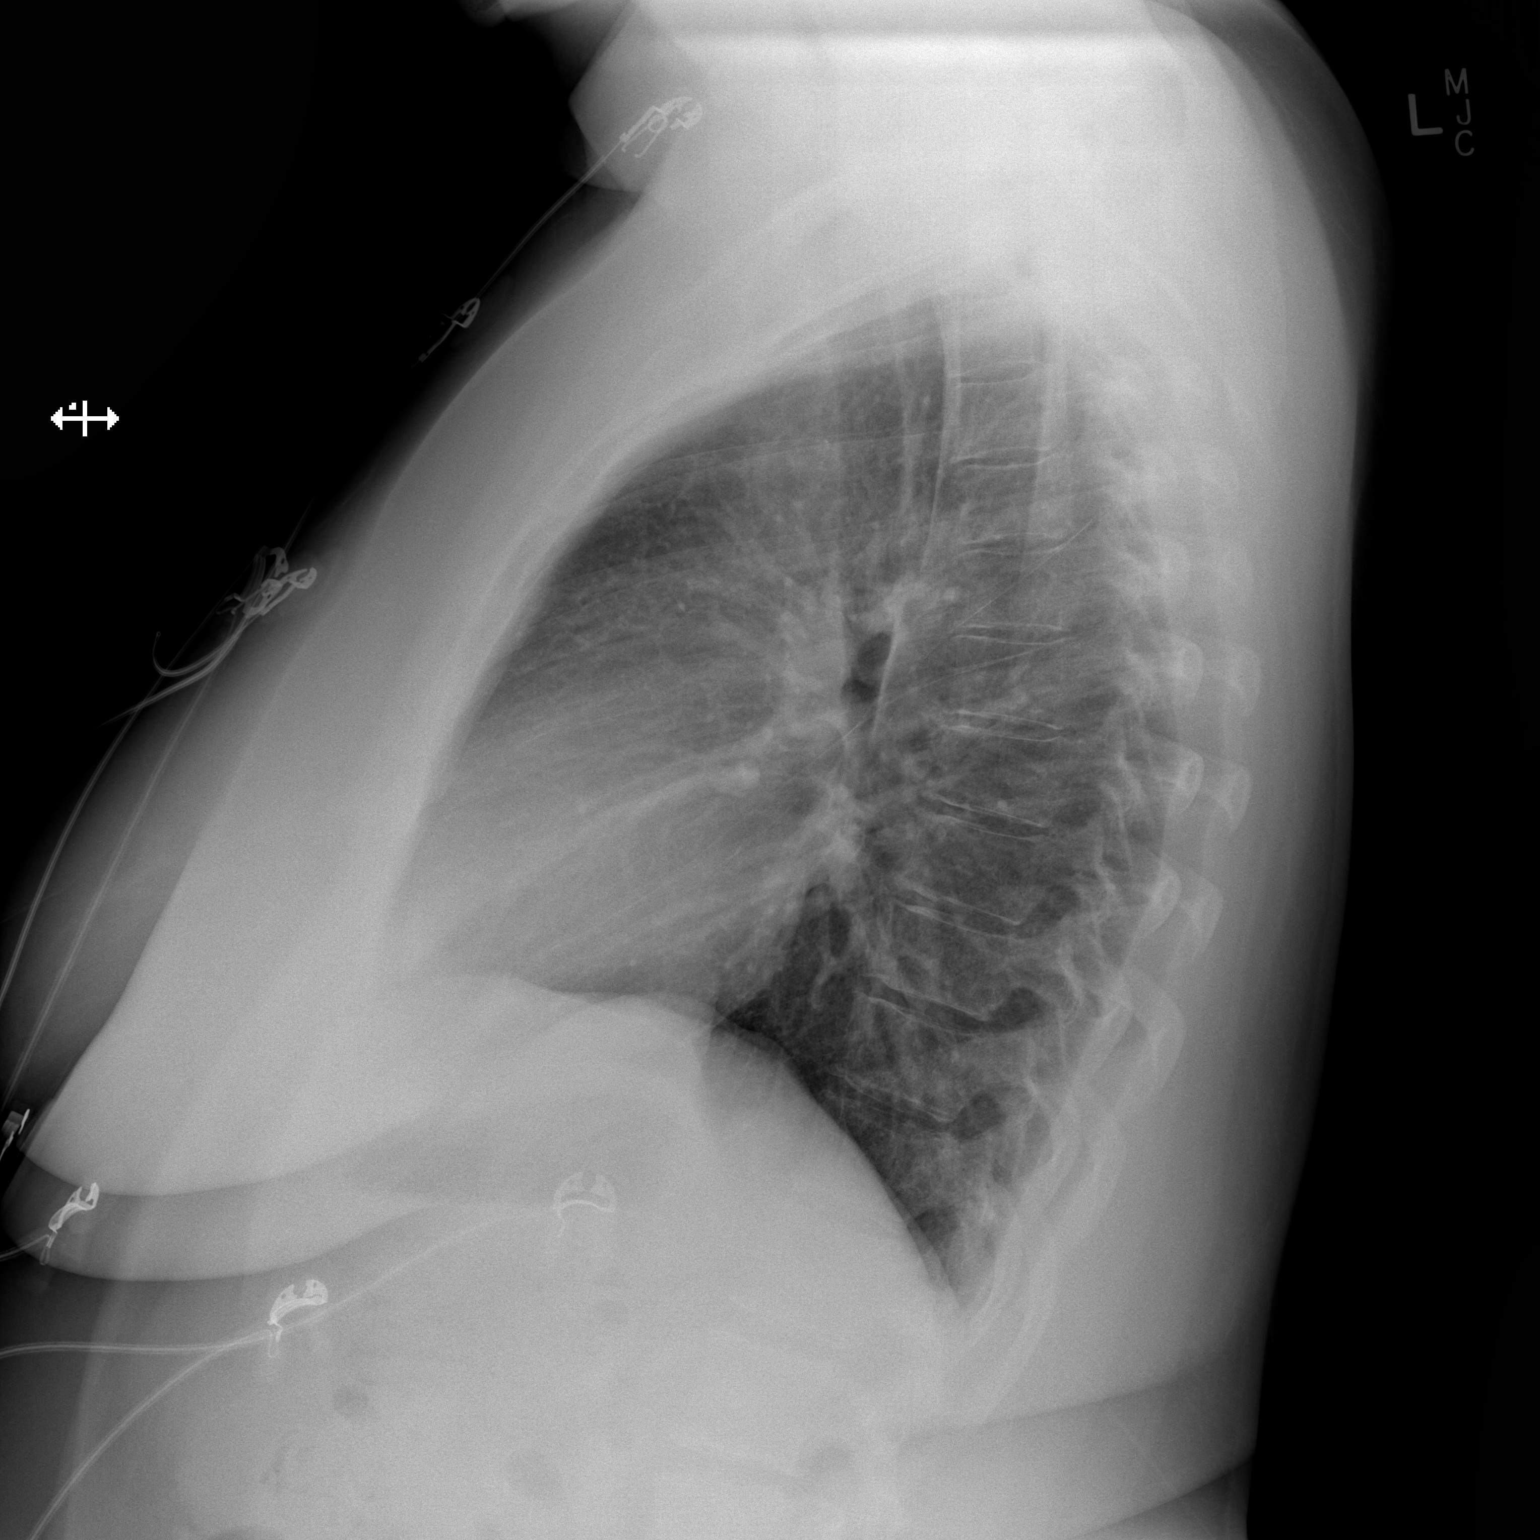

[2 of 2 positions shown; findings below may reference images not displayed]

FINDINGS: Normal heart size and mediastinal contours. No acute infiltrate or
edema. No effusion or pneumothorax. No acute osseous findings.
IMPRESSION: No active cardiopulmonary disease.

## 2016-03-30 ENCOUNTER — Telehealth: Payer: Self-pay | Admitting: *Deleted

## 2016-03-30 ENCOUNTER — Inpatient Hospital Stay (HOSPITAL_COMMUNITY)
Admission: AD | Admit: 2016-03-30 | Discharge: 2016-03-30 | Disposition: A | Discharge status: Home / Self Care | Payer: 59 | Source: Ambulatory Visit | Attending: Obstetrics | Admitting: Obstetrics

## 2016-03-30 ENCOUNTER — Ambulatory Visit (INDEPENDENT_AMBULATORY_CARE_PROVIDER_SITE_OTHER): Payer: 59 | Admitting: Obstetrics

## 2016-03-30 VITALS — BP 112/78 | HR 86 | Wt 238.0 lb

## 2016-03-30 DIAGNOSIS — N946 Dysmenorrhea, unspecified: Secondary | ICD-10-CM

## 2016-03-30 DIAGNOSIS — Z5321 Procedure and treatment not carried out due to patient leaving prior to being seen by health care provider: Secondary | ICD-10-CM | POA: Insufficient documentation

## 2016-03-30 DIAGNOSIS — N939 Abnormal uterine and vaginal bleeding, unspecified: Secondary | ICD-10-CM | POA: Diagnosis not present

## 2016-03-30 DIAGNOSIS — Z3042 Encounter for surveillance of injectable contraceptive: Secondary | ICD-10-CM | POA: Diagnosis not present

## 2016-03-30 DIAGNOSIS — D251 Intramural leiomyoma of uterus: Secondary | ICD-10-CM

## 2016-03-30 LAB — POCT URINALYSIS DIPSTICK
Bilirubin, UA: NEGATIVE
Blood, UA: 250
GLUCOSE UA: NEGATIVE
Ketones, UA: NEGATIVE
LEUKOCYTES UA: NEGATIVE
NITRITE UA: NEGATIVE
Spec Grav, UA: 1.01
UROBILINOGEN UA: 4
pH, UA: 7

## 2016-03-30 LAB — SAMPLE TO BLOOD BANK

## 2016-03-30 LAB — POCT URINE PREGNANCY: PREG TEST UR: NEGATIVE

## 2016-03-30 MED ORDER — MEDROXYPROGESTERONE ACETATE 400 MG/ML IM SUSP
400.0000 mg | Freq: Once | INTRAMUSCULAR | Status: AC
  Administered 2016-03-30: 400 mg via INTRAMUSCULAR
  Filled 2016-03-30: qty 1

## 2016-03-30 MED ORDER — IBUPROFEN 800 MG PO TABS: 800.0000 mg | ORAL_TABLET | Freq: Three times a day (TID) | ORAL | Status: DC | PRN

## 2016-03-30 MED ORDER — OXYCODONE-ACETAMINOPHEN 10-325 MG PO TABS: 1.0000 | ORAL_TABLET | Freq: Four times a day (QID) | ORAL | Status: DC | PRN

## 2016-03-30 MED ORDER — MEDROXYPROGESTERONE ACETATE 150 MG/ML IM SUSP: 150.0000 mg | INTRAMUSCULAR | Status: DC

## 2016-03-30 NOTE — MAU Note (Signed)
Pt called, not in lobby 

## 2016-03-30 NOTE — Telephone Encounter (Signed)
Patient called with heavy bleeding and stomach burning inside. 2:58 Patient scheduled and in office before I could respond to call.

## 2016-03-31 ENCOUNTER — Encounter: Payer: Self-pay | Admitting: Obstetrics

## 2016-03-31 NOTE — Progress Notes (Signed)
Patient ID: Nicole [name], female   DOB: 11-Apr-1976, 40 y.o.   MRN: [id number]  Chief Complaint  Patient presents with  . Menorrhagia    heavy bleeding x 1 week, abd pain, increase in gas    HPI Nicole [name] is a 39 y.o. female.  Heavy vaginal bleeding with clots, cramping and gas.  HPI  Past Medical History  Diagnosis Date  . Anemia   . Asthma   . Blood transfusion 2011    r/t anemia  . Migraine   . Sickle cell trait ([place])   . Seizures [facility]     last sz Jan 2012  . Seizures ([place])   . Fibroid   . Tooth abscess   . Sickle cell trait ([place])   . Migraine     Past Surgical History  Procedure Laterality Date  . Tubal ligation Bilateral 2005    Family History  Problem Relation Age of Onset  . Anesthesia problems Neg Hx   . Hypertension Mother   . Diabetes Mother   . Cancer Mother     Colon, brain, 2 other  . Asthma Mother   . Clotting disorder Mother   . Heart disease Mother     8, stents  . Stroke Mother     2012  . Diabetes Father   . Asthma Father   . Clotting disorder Father   . Sickle cell anemia Father   . Heart disease Father     2000, stents  . Hypertension Father   . Stroke Father     2007  . Diabetes Maternal Grandmother   . Asthma Sister   . Asthma Brother   . Asthma Paternal Grandmother     Social History Social History  Substance Use Topics  . Smoking status: Former Smoker    Quit date: 03/27/2014  . Smokeless tobacco: Never Used  . Alcohol Use: No    Allergies  Allergen Reactions  . Shrimp [Shellfish Allergy] Anaphylaxis  . Tea Anaphylaxis    All teas  . Penicillins Swelling    Has patient had a PCN reaction causing immediate rash, facial/tongue/throat swelling, SOB or lightheadedness with hypotension: Yes Has patient had a PCN reaction causing severe rash involving mucus membranes or skin necrosis: Yes Has patient had a PCN reaction that required hospitalization Yes- ed visit Has patient had a PCN reaction occurring within the  last 10 years: No-childhood allergy If all of the above answers are "NO", then may proceed with Cephalosporin use.   . Sulfa Antibiotics Rash    Current Outpatient Prescriptions  Medication Sig Dispense Refill  . docusate sodium 100 MG CAPS Take 100 mg by mouth daily. 10 capsule 0  . FLUoxetine (PROZAC) 20 MG capsule Take 1 capsule (20 mg total) by mouth daily. 30 capsule 3  . gabapentin (NEURONTIN) 300 MG capsule Take 1 capsule (300 mg total) by mouth 3 (three) times daily. (Patient taking differently: Take 300-1,200 mg by mouth 3 (three) times daily. Takes one capsule twice daily and then takes four capsules at bedtime.) 90 capsule 0  . Lacosamide 100 MG TABS Take 1 tablet (100 mg total) by mouth 2 (two) times daily. 60 tablet 0  . levETIRAcetam (KEPPRA) 1000 MG tablet TAKE 1 TABLET BY MOUTH TWICE A DAY 60 tablet 3  . Prenatal Vit-Fe Fumarate-FA (PNV PRENATAL PLUS MULTIVITAMIN) 27-1 MG TABS Take 1 tablet by mouth daily.    Marland Kitchen topiramate (TOPAMAX) 25 MG tablet TAKE 3 TABLETS BY MOUTH DAILY 90  tablet 3  . acetaminophen (TYLENOL) 500 MG tablet Take 1 tablet (500 mg total) by mouth every 6 (six) hours as needed. 14 tablet 0  . albuterol (PROVENTIL HFA;VENTOLIN HFA) 108 (90 BASE) MCG/ACT inhaler Inhale 2 puffs into the lungs every 4 (four) hours as needed for wheezing or shortness of breath (or coughing). 1 Inhaler prn  . fexofenadine (ALLEGRA) 180 MG tablet Take 1 tablet (180 mg total) by mouth daily. 30 tablet 3  . fluticasone (FLONASE) 50 MCG/ACT nasal spray Place 2 sprays into both nostrils daily. 16 g 2  . ibuprofen (ADVIL,MOTRIN) 800 MG tablet Take 1 tablet (800 mg total) by mouth every 8 (eight) hours as needed for mild pain or moderate pain. 30 tablet 5  . loratadine (CLARITIN) 10 MG tablet Take 1 tablet (10 mg total) by mouth daily. 30 tablet 11  . medroxyPROGESTERone (DEPO-PROVERA) 150 MG/ML injection Inject 1 mL (150 mg total) into the muscle every 3 (three) months. Inject on  the 22nd of  each month 1 mL 3  . omeprazole (PRILOSEC) 20 MG capsule Take 1 capsule (20 mg total) by mouth daily. (Patient not taking: Reported on 03/30/2016) 30 capsule 0  . oxyCODONE-acetaminophen (PERCOCET) 10-325 MG tablet Take 1 tablet by mouth every 6 (six) hours as needed for pain. 40 tablet 0  . [DISCONTINUED] ipratropium (ATROVENT) 0.06 % nasal spray Place 2 sprays into both nostrils 4 (four) times daily. (Patient not taking: Reported on 06/21/2015) 15 mL 0   No current facility-administered medications for this visit.    Review of Systems Review of Systems Constitutional: negative for fatigue and weight loss Respiratory: negative for cough and wheezing Cardiovascular: negative for chest pain, fatigue and palpitations Gastrointestinal: negative for abdominal pain and change in bowel habits Genitourinary: positive for heavy vaginal bleeding with cramping and gas Integument/breast: negative for nipple discharge Musculoskeletal:negative for myalgias Neurological: negative for gait problems and tremors Behavioral/Psych: negative for abusive relationship, depression Endocrine: negative for temperature intolerance     Blood pressure 112/78, pulse 86, weight 238 lb (107.956 kg), last menstrual period 03/20/2016.  Physical Exam Physical Exam            General:  Alert and no distress Abdomen:  normal findings: no organomegaly, soft, non-tender and no hernia  Pelvis:  External genitalia: normal general appearance Urinary system: urethral meatus normal and bladder without fullness, nontender Vaginal: normal without tenderness, induration or masses Cervix: normal appearance Adnexa: normal bimanual exam Uterus: anteverted and non-tender, normal size      Data Reviewed Labs Previous ultrasounds  Assessment     AUB - Hormonal Imbalance  Dysmenorrhea H/O uterine fibroids, small, intramural    Plan    Depo Provera 400mg  IM ordered and to be repeated in 2 weeks, then Depo 150mg  IM q 3  months  Ibuprofen / Percocet Rx F/U in 2 weeks  Orders Placed This Encounter  Procedures  . NuSwab Vaginitis Plus (VG+)  . POCT urinalysis dipstick  . POCT urine pregnancy   Meds ordered this encounter  Medications  . ibuprofen (ADVIL,MOTRIN) 800 MG tablet    Sig: Take 1 tablet (800 mg total) by mouth every 8 (eight) hours as needed for mild pain or moderate pain.    Dispense:  30 tablet    Refill:  5  . oxyCODONE-acetaminophen (PERCOCET) 10-325 MG tablet    Sig: Take 1 tablet by mouth every 6 (six) hours as needed for pain.    Dispense:  40 tablet  Refill:  0  . medroxyPROGESTERone (DEPO-PROVERA) 150 MG/ML injection    Sig: Inject 1 mL (150 mg total) into the muscle every 3 (three) months. Inject on  the 22nd of each month    Dispense:  1 mL    Refill:  3

## 2016-04-02 ENCOUNTER — Observation Stay (HOSPITAL_COMMUNITY)
Admission: EM | Admit: 2016-04-02 | Discharge: 2016-04-03 | Disposition: A | Discharge status: Home / Self Care | Payer: 59 | Attending: Family Medicine | Admitting: Family Medicine

## 2016-04-02 ENCOUNTER — Emergency Department (HOSPITAL_COMMUNITY): Payer: 59

## 2016-04-02 ENCOUNTER — Encounter (HOSPITAL_COMMUNITY): Payer: Self-pay | Admitting: Emergency Medicine

## 2016-04-02 DIAGNOSIS — R569 Unspecified convulsions: Secondary | ICD-10-CM | POA: Diagnosis not present

## 2016-04-02 DIAGNOSIS — N938 Other specified abnormal uterine and vaginal bleeding: Secondary | ICD-10-CM | POA: Insufficient documentation

## 2016-04-02 DIAGNOSIS — K219 Gastro-esophageal reflux disease without esophagitis: Secondary | ICD-10-CM | POA: Insufficient documentation

## 2016-04-02 DIAGNOSIS — D571 Sickle-cell disease without crisis: Secondary | ICD-10-CM | POA: Diagnosis not present

## 2016-04-02 DIAGNOSIS — Z9119 Patient's noncompliance with other medical treatment and regimen: Secondary | ICD-10-CM | POA: Insufficient documentation

## 2016-04-02 DIAGNOSIS — J45909 Unspecified asthma, uncomplicated: Secondary | ICD-10-CM | POA: Diagnosis not present

## 2016-04-02 DIAGNOSIS — R1084 Generalized abdominal pain: Secondary | ICD-10-CM | POA: Diagnosis not present

## 2016-04-02 DIAGNOSIS — Z880 Allergy status to penicillin: Secondary | ICD-10-CM | POA: Insufficient documentation

## 2016-04-02 DIAGNOSIS — D259 Leiomyoma of uterus, unspecified: Secondary | ICD-10-CM | POA: Insufficient documentation

## 2016-04-02 DIAGNOSIS — G4733 Obstructive sleep apnea (adult) (pediatric): Secondary | ICD-10-CM | POA: Diagnosis not present

## 2016-04-02 DIAGNOSIS — F4321 Adjustment disorder with depressed mood: Secondary | ICD-10-CM | POA: Diagnosis not present

## 2016-04-02 DIAGNOSIS — F329 Major depressive disorder, single episode, unspecified: Secondary | ICD-10-CM | POA: Diagnosis not present

## 2016-04-02 DIAGNOSIS — Z87891 Personal history of nicotine dependence: Secondary | ICD-10-CM | POA: Insufficient documentation

## 2016-04-02 DIAGNOSIS — M79604 Pain in right leg: Secondary | ICD-10-CM | POA: Diagnosis not present

## 2016-04-02 DIAGNOSIS — Z91013 Allergy to seafood: Secondary | ICD-10-CM | POA: Diagnosis not present

## 2016-04-02 DIAGNOSIS — N939 Abnormal uterine and vaginal bleeding, unspecified: Secondary | ICD-10-CM

## 2016-04-02 DIAGNOSIS — G43909 Migraine, unspecified, not intractable, without status migrainosus: Secondary | ICD-10-CM | POA: Diagnosis not present

## 2016-04-02 DIAGNOSIS — Z9114 Patient's other noncompliance with medication regimen: Secondary | ICD-10-CM | POA: Diagnosis not present

## 2016-04-02 DIAGNOSIS — D649 Anemia, unspecified: Secondary | ICD-10-CM | POA: Insufficient documentation

## 2016-04-02 DIAGNOSIS — R631 Polydipsia: Secondary | ICD-10-CM | POA: Insufficient documentation

## 2016-04-02 DIAGNOSIS — Z882 Allergy status to sulfonamides status: Secondary | ICD-10-CM | POA: Diagnosis not present

## 2016-04-02 DIAGNOSIS — G40909 Epilepsy, unspecified, not intractable, without status epilepticus: Secondary | ICD-10-CM | POA: Diagnosis not present

## 2016-04-02 DIAGNOSIS — F32A Depression, unspecified: Secondary | ICD-10-CM | POA: Insufficient documentation

## 2016-04-02 DIAGNOSIS — E538 Deficiency of other specified B group vitamins: Secondary | ICD-10-CM | POA: Diagnosis not present

## 2016-04-02 DIAGNOSIS — M79605 Pain in left leg: Secondary | ICD-10-CM | POA: Diagnosis not present

## 2016-04-02 LAB — NUSWAB VAGINITIS PLUS (VG+)
CANDIDA ALBICANS, NAA: POSITIVE — AB
CANDIDA GLABRATA, NAA: NEGATIVE
Chlamydia trachomatis, NAA: NEGATIVE
NEISSERIA GONORRHOEAE, NAA: NEGATIVE
Trich vag by NAA: NEGATIVE

## 2016-04-02 LAB — CBC WITH DIFFERENTIAL/PLATELET
BASOS ABS: 0.1 10*3/uL (ref 0.0–0.1)
Basophils Relative: 1 %
EOS ABS: 0.3 10*3/uL (ref 0.0–0.7)
Eosinophils Relative: 6 %
HEMATOCRIT: 31.5 % — AB (ref 36.0–46.0)
HEMOGLOBIN: 10.8 g/dL — AB (ref 12.0–15.0)
LYMPHS PCT: 46 %
Lymphs Abs: 2.3 10*3/uL (ref 0.7–4.0)
MCH: 29.2 pg (ref 26.0–34.0)
MCHC: 34.3 g/dL (ref 30.0–36.0)
MCV: 85.1 fL (ref 78.0–100.0)
MONOS PCT: 7 %
Monocytes Absolute: 0.4 10*3/uL (ref 0.1–1.0)
Neutro Abs: 2.1 10*3/uL (ref 1.7–7.7)
Neutrophils Relative %: 40 %
Platelets: 245 10*3/uL (ref 150–400)
RBC: 3.7 MIL/uL — AB (ref 3.87–5.11)
RDW: 12.6 % (ref 11.5–15.5)
WBC: 5.2 10*3/uL (ref 4.0–10.5)

## 2016-04-02 LAB — ETHANOL

## 2016-04-02 LAB — RAPID URINE DRUG SCREEN, HOSP PERFORMED
Amphetamines: NOT DETECTED
BARBITURATES: NOT DETECTED
BENZODIAZEPINES: NOT DETECTED
COCAINE: NOT DETECTED
OPIATES: NOT DETECTED
Tetrahydrocannabinol: NOT DETECTED

## 2016-04-02 LAB — BASIC METABOLIC PANEL
Anion gap: 9 (ref 5–15)
BUN: 7 mg/dL (ref 6–20)
CHLORIDE: 113 mmol/L — AB (ref 101–111)
CO2: 20 mmol/L — AB (ref 22–32)
Calcium: 8.8 mg/dL — ABNORMAL LOW (ref 8.9–10.3)
Creatinine, Ser: 0.73 mg/dL (ref 0.44–1.00)
GFR calc Af Amer: >60 mL/min (ref >60)
GFR calc non Af Amer: >60 mL/min (ref >60)
GLUCOSE: 86 mg/dL (ref 65–99)
POTASSIUM: 4.1 mmol/L (ref 3.5–5.1)
Sodium: 142 mmol/L (ref 135–145)

## 2016-04-02 LAB — TSH: TSH: 2.044 u[IU]/mL (ref 0.350–4.500)

## 2016-04-02 LAB — URINALYSIS, ROUTINE W REFLEX MICROSCOPIC
Bilirubin Urine: NEGATIVE
GLUCOSE, UA: NEGATIVE mg/dL
Ketones, ur: NEGATIVE mg/dL
Nitrite: NEGATIVE
PH: 7.5 (ref 5.0–8.0)
Protein, ur: NEGATIVE mg/dL
SPECIFIC GRAVITY, URINE: 1.007 (ref 1.005–1.030)

## 2016-04-02 LAB — FOLATE: FOLATE: 9.3 ng/mL (ref >5.9)

## 2016-04-02 LAB — URINE MICROSCOPIC-ADD ON: BACTERIA UA: NONE SEEN

## 2016-04-02 LAB — VITAMIN B12: VITAMIN B 12: 296 pg/mL (ref 180–914)

## 2016-04-02 LAB — MRSA PCR SCREENING: MRSA BY PCR: NEGATIVE

## 2016-04-02 MED ORDER — POLYETHYLENE GLYCOL 3350 17 G PO PACK: 17.0000 g | PACK | Freq: Every day | ORAL | Status: DC | PRN

## 2016-04-02 MED ORDER — LEVETIRACETAM 750 MG PO TABS
1500.0000 mg | ORAL_TABLET | Freq: Once | ORAL | Status: AC
  Administered 2016-04-02: 1500 mg via ORAL
  Filled 2016-04-02: qty 2

## 2016-04-02 MED ORDER — SODIUM CHLORIDE 0.9% FLUSH
3.0000 mL | Freq: Two times a day (BID) | INTRAVENOUS | Status: DC
  Administered 2016-04-02 – 2016-04-03 (×2): 3 mL via INTRAVENOUS

## 2016-04-02 MED ORDER — TOPIRAMATE 25 MG PO TABS
75.0000 mg | ORAL_TABLET | Freq: Every day | ORAL | Status: DC
  Administered 2016-04-03: 75 mg via ORAL
  Filled 2016-04-02: qty 3

## 2016-04-02 MED ORDER — LORATADINE 10 MG PO TABS: 10.0000 mg | ORAL_TABLET | Freq: Every day | ORAL | Status: DC | PRN

## 2016-04-02 MED ORDER — ACETAMINOPHEN 500 MG PO TABS: 500.0000 mg | ORAL_TABLET | Freq: Four times a day (QID) | ORAL | Status: DC | PRN

## 2016-04-02 MED ORDER — LACOSAMIDE 50 MG PO TABS
100.0000 mg | ORAL_TABLET | Freq: Two times a day (BID) | ORAL | Status: DC
  Administered 2016-04-03: 100 mg via ORAL
  Filled 2016-04-02: qty 2

## 2016-04-02 MED ORDER — FLUOXETINE HCL 20 MG PO CAPS
20.0000 mg | ORAL_CAPSULE | Freq: Every day | ORAL | Status: DC
  Administered 2016-04-03: 20 mg via ORAL
  Filled 2016-04-02 (×2): qty 1

## 2016-04-02 MED ORDER — ENOXAPARIN SODIUM 60 MG/0.6ML ~~LOC~~ SOLN
55.0000 mg | Freq: Every day | SUBCUTANEOUS | Status: DC
  Administered 2016-04-02: 55 mg via SUBCUTANEOUS
  Filled 2016-04-02: qty 0.6

## 2016-04-02 MED ORDER — IBUPROFEN 200 MG PO TABS
800.0000 mg | ORAL_TABLET | Freq: Three times a day (TID) | ORAL | Status: DC | PRN
  Administered 2016-04-03: 800 mg via ORAL
  Filled 2016-04-02 (×2): qty 4

## 2016-04-02 MED ORDER — LEVETIRACETAM 500 MG PO TABS
1000.0000 mg | ORAL_TABLET | Freq: Two times a day (BID) | ORAL | Status: DC
  Administered 2016-04-02 – 2016-04-03 (×2): 1000 mg via ORAL
  Filled 2016-04-02 (×2): qty 2

## 2016-04-02 MED ORDER — PANTOPRAZOLE SODIUM 40 MG PO TBEC
40.0000 mg | DELAYED_RELEASE_TABLET | Freq: Every day | ORAL | Status: DC
  Administered 2016-04-02 – 2016-04-03 (×2): 40 mg via ORAL
  Filled 2016-04-02 (×2): qty 1

## 2016-04-02 MED ORDER — GABAPENTIN 300 MG PO CAPS
300.0000 mg | ORAL_CAPSULE | Freq: Three times a day (TID) | ORAL | Status: DC
  Administered 2016-04-02 – 2016-04-03 (×2): 300 mg via ORAL
  Filled 2016-04-02 (×2): qty 1

## 2016-04-02 MED ORDER — FLUTICASONE PROPIONATE 50 MCG/ACT NA SUSP
2.0000 | Freq: Every day | NASAL | Status: DC
  Administered 2016-04-02 – 2016-04-03 (×2): 2 via NASAL
  Filled 2016-04-02: qty 16

## 2016-04-02 NOTE — H&P (Signed)
[facility] Admission History and Physical Service Pager: [phone]  Patient name: Nicole [name] Medical record number: [id number] Date of birth: Nov 29, 1976 Age: 40 y.o. Gender: female  Primary Care Provider: Tawanna Sat, MD Consultants: Neurology Code Status: FULL (discussed upon admission)   Chief Complaint: Seizures   Assessment and Plan: Nicole [name] is a 40 y.o. female presenting with seizures x3 today . PMH is significant for seizures, sickle cell trait, adjustment disorder with depressed mood and GERD.  Seizures: With known seizure disorder. Patient has been non-compliant with medications for approximately 6 weeks. Patient had 3 seizures on day of admission. Patient s/p 1500 mg Keppra load. CT head, UDS, and EtoH negative. Patient is sleepy, but oriented and responding to questions appropriately at time of admission.  -admit to SDU, vital signs per unit  -neuro checks q2h -neurology consulted, appreciate recs  -resume home Keppra 1000 mg BID and Topamax 75 mg daily  -patient reports she has never taken lacosamide 100 mg BID due to insurance reasons, will defer to neurology regarding starting this medication  -will not order EEG per neuro as known cause of seizures and patient with normal neuro exam  -NPO except sips for meds pending swallow screen    Abdominal Pain with DUB: Patient with h/o uterine fibroids. Patient describes pain as cramping and has been occuring with uterine bleeding. Abdominal pain likely related to severe menorrhagia. Patient seen by OB on 4/21 and given Depo Provera shot as well as Ibuprofen and Percocet prescriptions. Patient reports still actively bleeding.  -continue Ibuprofen 800 mg q8h  -hold Percocet due to potential for sedation  -continue to monitor  Anemia: Normocytic. Likely secondary to DUB. HgB 10.8 (bl 12-13).  -CBC in AM   Leg Pain: Seems consistent with neuropathic pain. Present bilaterally anterior and  posterior. Low suspicion for DVT given bilateral presentation, no palpable cords, and no LE edema. Per EMR, patient takes gabapentin 300 mg twice daily and 1200 mg at bedtime. -gabapentin 300 mg TID (holding higher dose at bedtime due to possible sedation), could consider increasing dose as tolerated  -will order TSH, folate, and B12   Adjustment Disorder with Depressed Mood: Stable at admission.  -continue home Prozac 20 mg daily   GERD:  -Protonix in place of home Prilosec   Allergic Rhinitis:  -continue home Claritin and Flonase    FEN/GI: NPO pending RN Swallow Screen, SLIV Prophylaxis: Lovenox   Disposition: Admit to FPTS; Attending [place]   History of Present Illness:  Nicole [name] is a 40 y.o. female presenting with 3 seizures today. Patient does not recall what occurred to cause her to need evaluation in the ED. 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  Husband reports that she seized 3 times this morning. The first two lasted about 15 minutes and she returned baseline after the first two seizures. The third seizure lasted longer and mental status did not return to baseline. EMS was called but is unsure of any medications given during transportation. Husband reports patient did not know what day it was and was very confused after third seizure. She was taken to [facility] ED where she was given a loading dose of Keppra.   Review Of Systems: Per HPI with the following additions:  Positive: coughing, feet swelling, dyspnea, abdominal cramping Negative: sneezing, rhinorrhea, dysuria, fevers, chills,  chest pain Otherwise the remainder of the systems were negative.  Patient Active Problem List   Diagnosis Date Noted  . Fatigue 10/28/2015  . Adjustment disorder with depressed mood 10/21/2015  . Dizziness   . Weakness 11/18/2014  . OSA (obstructive  sleep apnea) 10/21/2014  . Acute rhinosinusitis 10/09/2014  . Leg pain, lateral 09/04/2014  . Migraines 08/13/2014  . Seizure ([place]) 06/04/2014  . Leiomyoma of uterus, unspecified 02/11/2014  . GERD (gastroesophageal reflux disease) 01/28/2014  . Candidiasis of vulva and vagina 01/28/2014  . Unspecified symptom associated with female genital organs 01/28/2014  . Chest pain 01/20/2014  . Acute pericarditis, unspecified 01/20/2014    Past Medical History: Past Medical History  Diagnosis Date  . Anemia   . Asthma   . Blood transfusion 2011    r/t anemia  . Migraine   . Sickle cell trait ([place])   . Seizures [facility])     last sz Jan 2012  . Seizures ([place])   . Fibroid   . Tooth abscess   . Sickle cell trait ([place])   . Migraine     Past Surgical History: Past Surgical History  Procedure Laterality Date  . Tubal ligation Bilateral 2005    Social History: Social History  Substance Use Topics  . Smoking status: Former Smoker    Quit date: 03/27/2014  . Smokeless tobacco: Never Used  . Alcohol Use: No   Additional social history: Denies illicit drug use.   Please also refer to relevant sections of EMR.  Family History: Family History  Problem Relation Age of Onset  . Anesthesia problems Neg Hx   . Hypertension Mother   . Diabetes Mother   . Cancer Mother     Colon, brain, 2 other  . Asthma Mother   . Clotting disorder Mother   . Heart disease Mother     66, stents  . Stroke Mother     2012  . Diabetes Father   . Asthma Father   . Clotting disorder Father   . Sickle cell anemia Father   . Heart disease Father     2000, stents  . Hypertension Father   . Stroke Father     2007  . Diabetes Maternal Grandmother   . Asthma Sister   . Asthma Brother   . Asthma Paternal Grandmother     Allergies and Medications: Allergies  Allergen Reactions  . Shrimp [Shellfish Allergy] Anaphylaxis  . Tea Anaphylaxis    All teas  . Penicillins Swelling    Has patient had  a PCN reaction causing immediate rash, facial/tongue/throat swelling, SOB or lightheadedness with hypotension: Yes Has patient had a PCN reaction causing severe rash involving mucus membranes or skin necrosis: Yes Has patient had a PCN reaction that required hospitalization Yes- ed visit Has patient had a PCN reaction occurring within the last 10 years: No-childhood allergy If all of the above answers are "NO", then may proceed with Cephalosporin use.   . Sulfa Antibiotics Rash   No current facility-administered medications on file prior to encounter.   Current Outpatient Prescriptions on File Prior to Encounter  Medication Sig Dispense Refill  . acetaminophen (TYLENOL) 500 MG tablet Take 1 tablet (500 mg total) by mouth every 6 (six) hours as needed. 14 tablet 0  . albuterol (PROVENTIL HFA;VENTOLIN HFA) 108 (90 BASE) MCG/ACT inhaler Inhale 2 puffs into the lungs every 4 (four) hours as needed for wheezing or shortness of breath (or coughing). 1 Inhaler prn  .  fexofenadine (ALLEGRA) 180 MG tablet Take 1 tablet (180 mg total) by mouth daily. (Patient taking differently: Take 180 mg by mouth daily as needed for allergies. ) 30 tablet 3  . FLUoxetine (PROZAC) 20 MG capsule Take 1 capsule (20 mg total) by mouth daily. 30 capsule 3  . fluticasone (FLONASE) 50 MCG/ACT nasal spray Place 2 sprays into both nostrils daily. 16 g 2  . gabapentin (NEURONTIN) 300 MG capsule Take 1 capsule (300 mg total) by mouth 3 (three) times daily. (Patient taking differently: Take 300-1,200 mg by mouth 3 (three) times daily. Takes one capsule twice daily and then takes four capsules at bedtime.) 90 capsule 0  . ibuprofen (ADVIL,MOTRIN) 800 MG tablet Take 1 tablet (800 mg total) by mouth every 8 (eight) hours as needed for mild pain or moderate pain. 30 tablet 5  . Lacosamide 100 MG TABS Take 1 tablet (100 mg total) by mouth 2 (two) times daily. 60 tablet 0  . levETIRAcetam (KEPPRA) 1000 MG tablet TAKE 1 TABLET BY MOUTH  TWICE A DAY 60 tablet 3  . loratadine (CLARITIN) 10 MG tablet Take 1 tablet (10 mg total) by mouth daily. (Patient taking differently: Take 10 mg by mouth daily as needed for allergies. ) 30 tablet 11  . medroxyPROGESTERone (DEPO-PROVERA) 150 MG/ML injection Inject 1 mL (150 mg total) into the muscle every 3 (three) months. Inject on  the 22nd of each month 1 mL 3  . omeprazole (PRILOSEC) 20 MG capsule Take 1 capsule (20 mg total) by mouth daily. (Patient taking differently: Take 20 mg by mouth daily as needed (acid relfux). ) 30 capsule 0  . oxyCODONE-acetaminophen (PERCOCET) 10-325 MG tablet Take 1 tablet by mouth every 6 (six) hours as needed for pain. 40 tablet 0  . Prenatal Vit-Fe Fumarate-FA (PNV PRENATAL PLUS MULTIVITAMIN) 27-1 MG TABS Take 1 tablet by mouth daily.    Marland Kitchen topiramate (TOPAMAX) 25 MG tablet TAKE 3 TABLETS BY MOUTH DAILY 90 tablet 3  . [DISCONTINUED] ipratropium (ATROVENT) 0.06 % nasal spray Place 2 sprays into both nostrils 4 (four) times daily. (Patient not taking: Reported on 06/21/2015) 15 mL 0    Objective: BP 113/77 mmHg  Pulse 69  Temp(Src) 98.7 F (37.1 C) (Oral)  Resp 9  SpO2 99%  LMP 03/20/2016 Exam: General: female lying in bed, sleepy but arousable  Eyes: EOMI. PEERL.  ENTM: Oropharynx clear. MMM.  Neck: Full ROM.  Cardiovascular: RRR. No murmurs appreciated.  Respiratory: CTAB. Normal WOB.  Abdomen: +BS, soft, tender diffusely to palpation, ND  MSK: TTP bilaterally of feet, anterior/posterior LE. No LE edema. No palpable cords in calves.  Skin: Warm and dry.  Neuro: CN II-XII grossly intact. Strength 5/5 in UE and LE bilaterally. Speech is somewhat slowed but clear. Alert and oriented x3.  Psych: Appropriate mood and affect.   Labs and Imaging: CBC BMET   Recent Labs Lab 04/02/16 1740  WBC 5.2  HGB 10.8*  HCT 31.5*  PLT 245    Recent Labs Lab 04/02/16 1553  NA 142  K 4.1  CL 113*  CO2 20*  BUN 7  CREATININE 0.73  GLUCOSE 86  CALCIUM  8.8*     Ct Head Wo Contrast  04/02/2016  CLINICAL DATA:  Three seizures today. History of sickle cell anemia and seizure disorder. EXAM: CT HEAD WITHOUT CONTRAST TECHNIQUE: Contiguous axial images were obtained from the base of the skull through the vertex without intravenous contrast. COMPARISON:  MRI brain 10/20/2015.  Head  CT 10/19/2015. FINDINGS: Brain: There is no evidence of acute intracranial hemorrhage, mass lesion, brain edema or extra-axial fluid collection. The ventricles and subarachnoid spaces are appropriately sized for age. There is no CT evidence of acute cortical infarction. Empty sella turcica and chronic calcification along the posterior aspect of the falx are unchanged. Bones/sinuses/visualized face: There is mild bubbly opacity in the right division of the sphenoid sinus with interval improved aeration of ethmoid sinuses. There are no air-fluid levels. The calvarium is intact. IMPRESSION: 1. No acute intracranial findings or explanation for increased seizures. 2. New bubbly opacity in the right division of the sphenoid sinus suspicious for inflammation. Electronically Signed   By: Richardean Sale M.D.   On: 04/02/2016 16:31    Nicolette Bang, DO 04/02/2016, 10:22 PM PGY-1, [place] [facility] pager: [phone], text pages welcome  I have seen and examined the patient. I have read and agree with the above note. My changes are noted in blue.  Cordelia Poche, MD PGY-3, [facility] 04/02/2016, 10:49 PM

## 2016-04-02 NOTE — ED Notes (Signed)
Lab notified that need redraw on Pts CBC as it is clotted.

## 2016-04-02 NOTE — ED Notes (Signed)
Charge RN bedside for ultrasound IV placement

## 2016-04-02 NOTE — ED Notes (Signed)
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  Since that time she has not taken any of her own medications for reasons that are unclear at this time.  Pt is unable to explain to RN how long she has been off her meds but did state that she started taking them again yesterday or today-it was unclear.

## 2016-04-02 NOTE — ED Notes (Signed)
EEG order discussed with Agricultural consultant

## 2016-04-02 NOTE — ED Provider Notes (Signed)
CSN: [id number]     Arrival date & time 04/02/16  1312 History   First MD Initiated Contact with Patient 04/02/16 1330     Chief Complaint  Patient presents with  . Seizures     (Consider location/radiation/quality/duration/timing/severity/associated sxs/prior Treatment) Patient is a 40 y.o. female presenting with seizures. The history is provided by the patient, a relative and medical records. No language interpreter was used.  Seizures  DAMON RENNISON is a 40 y.o. female  with a PMH of seizures on Keppra and Topamax who presents to the Emergency Department for three seizures which occurred back to back witnessed by husband who is currently not at the bedside. She states that she has been stressed recently, taking care of her mother who had a stroke. She has not been taking her medications regularly because they make her drowsy and she is mother's primary caregiver. Patient was unable to give details about how often she is and is not taking her medication, however her daughter states that she has not been taking medication as directed for the last 6-7 months.  Level 5 caveat applies due to patient's condition.   Past Medical History  Diagnosis Date  . Anemia   . Asthma   . Blood transfusion 2011    r/t anemia  . Migraine   . Sickle cell trait ([place])   . Seizures [facility])     last sz Jan 2012  . Seizures ([place])   . Fibroid   . Tooth abscess   . Sickle cell trait ([place])   . Migraine    Past Surgical History  Procedure Laterality Date  . Tubal ligation Bilateral 2005   Family History  Problem Relation Age of Onset  . Anesthesia problems Neg Hx   . Hypertension Mother   . Diabetes Mother   . Cancer Mother     Colon, brain, 2 other  . Asthma Mother   . Clotting disorder Mother   . Heart disease Mother     61, stents  . Stroke Mother     2012  . Diabetes Father   . Asthma Father   . Clotting disorder Father   . Sickle cell anemia Father   . Heart disease Father     2000,  stents  . Hypertension Father   . Stroke Father     2007  . Diabetes Maternal Grandmother   . Asthma Sister   . Asthma Brother   . Asthma Paternal Grandmother    Social History  Substance Use Topics  . Smoking status: Former Smoker    Quit date: 03/27/2014  . Smokeless tobacco: Never Used  . Alcohol Use: No   OB History    Gravida Para Term Preterm AB TAB SAB Ectopic Multiple Living   3 2 1 1 1  0 0 1 0 2     Review of Systems  Unable to perform ROS: Acuity of condition  Neurological: Positive for seizures.      Allergies  Shrimp; Tea; Penicillins; and Sulfa antibiotics  Home Medications   Prior to Admission medications   Medication Sig Start Date End Date Taking? Authorizing Provider  acetaminophen (TYLENOL) 500 MG tablet Take 1 tablet (500 mg total) by mouth every 6 (six) hours as needed. 09/27/15  Yes Kayla Rose, PA-C  albuterol (PROVENTIL HFA;VENTOLIN HFA) 108 (90 BASE) MCG/ACT inhaler Inhale 2 puffs into the lungs every 4 (four) hours as needed for wheezing or shortness of breath (or coughing). 03/11/15  Yes Charles A  Jodi Mourning, MD  fexofenadine (ALLEGRA) 180 MG tablet Take 1 tablet (180 mg total) by mouth daily. Patient taking differently: Take 180 mg by mouth daily as needed for allergies.  08/13/14  Yes Leone Brand, MD  FLUoxetine (PROZAC) 20 MG capsule Take 1 capsule (20 mg total) by mouth daily. 10/28/15  Yes Alyssa A Haney, MD  fluticasone (FLONASE) 50 MCG/ACT nasal spray Place 2 sprays into both nostrils daily. 10/22/15  Yes Asiyah Cletis Media, MD  gabapentin (NEURONTIN) 300 MG capsule Take 1 capsule (300 mg total) by mouth 3 (three) times daily. Patient taking differently: Take 300-1,200 mg by mouth 3 (three) times daily. Takes one capsule twice daily and then takes four capsules at bedtime. 01/25/15  Yes Tanna Furry, MD  ibuprofen (ADVIL,MOTRIN) 800 MG tablet Take 1 tablet (800 mg total) by mouth every 8 (eight) hours as needed for mild pain or moderate pain.  03/30/16  Yes Shelly Bombard, MD  Lacosamide 100 MG TABS Take 1 tablet (100 mg total) by mouth 2 (two) times daily. 10/23/15  Yes Frazier Richards, MD  levETIRAcetam (KEPPRA) 1000 MG tablet TAKE 1 TABLET BY MOUTH TWICE A DAY 03/07/16  Yes Leone Brand, MD  loratadine (CLARITIN) 10 MG tablet Take 1 tablet (10 mg total) by mouth daily. Patient taking differently: Take 10 mg by mouth daily as needed for allergies.  03/11/15  Yes Shelly Bombard, MD  medroxyPROGESTERone (DEPO-PROVERA) 150 MG/ML injection Inject 1 mL (150 mg total) into the muscle every 3 (three) months. Inject on  the 22nd of each month 03/30/16  Yes Shelly Bombard, MD  omeprazole (PRILOSEC) 20 MG capsule Take 1 capsule (20 mg total) by mouth daily. Patient taking differently: Take 20 mg by mouth daily as needed (acid relfux).  07/26/15  Yes Waynetta Pean, PA-C  oxyCODONE-acetaminophen (PERCOCET) 10-325 MG tablet Take 1 tablet by mouth every 6 (six) hours as needed for pain. 03/30/16  Yes Shelly Bombard, MD  Prenatal Vit-Fe Fumarate-FA (PNV PRENATAL PLUS MULTIVITAMIN) 27-1 MG TABS Take 1 tablet by mouth daily. 07/22/14  Yes Historical Provider, MD  topiramate (TOPAMAX) 25 MG tablet TAKE 3 TABLETS BY MOUTH DAILY 03/07/16  Yes Leone Brand, MD   BP 111/80 mmHg  Pulse 59  Temp(Src) 97.9 F (36.6 C) (Oral)  Resp 18  SpO2 100%  LMP 03/20/2016 Physical Exam  Constitutional: She appears well-developed and well-nourished.  Alert and in no acute distress  HENT:  Head: Normocephalic and atraumatic.  Mouth/Throat: Oropharynx is clear and moist.  Airway patent, no evidence of tongue biting.  Cardiovascular: Normal rate, regular rhythm, normal heart sounds and intact distal pulses.  Exam reveals no gallop and no friction rub.   No murmur heard. Pulmonary/Chest: Effort normal and breath sounds normal. No respiratory distress. She has no wheezes. She has no rales. She exhibits no tenderness.  Abdominal: Soft. Bowel sounds are normal. She  exhibits no distension and no mass. There is no tenderness. There is no rebound and no guarding.  Musculoskeletal: She exhibits no edema.  Neurological:  Lethargic, postictal. Oriented to person and place. Able to follow commands.  Cranial Nerves: II:  Peripheral visual fields grossly normal, pupils equal, round, reactive to light III, IV, VI: EOM intact bilaterally, ptosis not present V,VII: smile symmetric, eyes kept closed tightly against resistance, facial light touch sensation equal VIII: hearing grossly normal IX, X: symmetric soft palate movement, uvula elevates symmetrically  XI: bilateral shoulder shrug symmetric and strong XII: midline  tongue extension Sensory to light touch normal in all four extremities.  Decreased strength to bilateral lower extremities.   Skin: Skin is warm and dry.  Nursing note and vitals reviewed.   ED Course  Procedures (including critical care time) Labs Review Labs Reviewed  URINALYSIS, ROUTINE W REFLEX MICROSCOPIC (NOT AT [facility]) - Abnormal; Notable for the following:    Hgb urine dipstick TRACE (*)    Leukocytes, UA SMALL (*)    All other components within normal limits  URINE MICROSCOPIC-ADD ON - Abnormal; Notable for the following:    Squamous Epithelial / LPF 0-5 (*)    All other components within normal limits  URINE CULTURE  BASIC METABOLIC PANEL  CBC WITH DIFFERENTIAL/PLATELET    Imaging Review No results found. I have personally reviewed and evaluated these images and lab results as part of my medical decision-making.   EKG Interpretation None      MDM   Final diagnoses:  None   Osvaldo Human  presents to ED via EMS for seizure-like activity at home witnessed by husband. Per nursing staff, patient had seizure at 10 AM, then at 11:30, and then another at 12:30. Husband is not at the bedside to confirm. Patient states that she has not been taking her seizure medication regularly because they make her drowsy and she is primary  caregiver for her mother who recently had a stroke. She is on Keppra 1000 mg twice a day and Topamax 25 mg three times a day. CBG by EMS was 117. On exam, patient is lethargic but arousable and will follow commands and answer questions with short responses appropriately. Pharmacist consulted for recommendation on keppra loading dose who recommends 1500mg  PO which was given.    3:46 PM - husband at bedside, stating that patient had 3 seizures as stated above. Patient still not back to baseline mental status. Husband states that the seizures were consistent with previous she has had in the past. He was unaware that the patient was not taking medication as directed.   Care assumed by oncoming provider PA Maybee, case discussed, plan agreed upon. CT head, follow up on pending labwork, re-evaluate mental status.   Patient discussed with Dr. Gilford Raid who agrees with treatment plan.   [facility], PA-C 04/02/16 1608  Isla Pence, MD 04/05/16 [phone]

## 2016-04-02 NOTE — ED Notes (Signed)
Main lab contacted @ this time to collect blood. RN and PA made aware.

## 2016-04-02 NOTE — ED Notes (Signed)
Unable to collect labs RN have been made aware

## 2016-04-02 NOTE — Progress Notes (Addendum)
Pt transferred from [facility] ED to [facility] 3s01 by [facility].  Pt is confused, lethargic, but has been oriented to surroundings with call bell within reach.  Awaiting MD orders.  Will continue to monitor.

## 2016-04-02 NOTE — ED Notes (Signed)
Patient transported to CT 

## 2016-04-02 NOTE — ED Provider Notes (Signed)
  Care assumed from [facility], [place].  Nicole [name] is a 40 y.o. female presents with 3 seizures today.  She is prescribed Keppra for seizures, but has intermittently compliant with medication in the last 6 months.  Pt's daughter reports that the medication makes the patient sleepy and the patient has been caring for her mother and does not want to be drowsy.  Pt has not returned to mental baseline.  The first seizure was at 10am, again at 11:30 and a third at 12:30pm.  Per husband, she returned to baseline between the first 2 but did not after the 3rd.  Her last seizure was July 2016.    LEVEL 5 Caveat for AMS   Physical Exam  BP 111/80 mmHg  Pulse 59  Temp(Src) 97.9 F (36.6 C) (Oral)  Resp 18  SpO2 100%  LMP 03/20/2016  Physical Exam   Face to face Exam:   General: Lethargic, alert to person and place, not oriented to time HEENT: Atraumatic  Resp: Normal effort  Abd: Nondistended, soft and nontender  Neuro: Decreased BLE and BUE strength to 4/5 with dorsiflexion and plantar flexion and grip strength pt following commands intermittently Lymph: No adenopathy    ED Course  Procedures  Postictal state [facility])  Seizure ([place])  MDM  Plan:  Labs pending.  Will obtain CT head and reassess.  Keppra given 1500mg  orally.   On my evaluation, pt continues to be very lethargic, intermittently following commands and has not returned to baseline in spite of Keppra load.  Concern for persistent subclinical seizures.  CT head without intercranial findings.  Labs reassuring. UDS negative. EtOH pending.  Pt family denies EtOH usage.   5:57 PM Discussed with Dr. Silverio Decamp and [facility] who will admit patient to stepdown bed for further evaluation and stat EEG.         Jarrett Soho Pandora Mccrackin, PA-C 04/02/16 1823  Harvel Quale, MD 04/05/16 [id number]

## 2016-04-02 NOTE — ED Notes (Signed)
Pts daughter states that Pt has not taken her meds for the last 7 months due to caring for her Grandmother.  The meds make Pt sleepy so she cannot take when caring for the Grandmother.

## 2016-04-02 NOTE — ED Notes (Signed)
RN unable to pull blood sample off of IV start.  Phlebotomy bedside to draw.

## 2016-04-02 NOTE — ED Notes (Signed)
Pt from home.  Husband reported to EMS that Pt has 2 seizures at 1000 today then another at 1130 and another at 84 prompting him to call EMS.  Pt takes Keppra with Hx of seizures that have been well controlled for several years.  CBG 117.  Vitals stable.  Pt arrives slightly lethargic.  Alert to self and situation only.  Pupils constricted.

## 2016-04-02 NOTE — Consult Note (Signed)
Neurology Consultation Reason for Consult: seizures Referring Physician: Dr Lonny Prude  CC: seizures  History is obtained from patient  HPI: Nicole [name] is a 40 y.o. female with hx of sz since the age of 8. She is very somnolent at the time of my exam but tells me that she stopped taking all her seizure medicines approx 6 weeks ago because she is taking care of her grandmother who had a stroke.  She is afraid that she will drop her grandmother because her seizure medicines make her very somnolent and this is the reason she is non compliant.  In this setting had three seizrues at home and in the ambulance today and was laoaded with keppra at [facility] propr to transfer.  Looking through the chart it looks like she had vaginal bleeding recently as well even though she did not volunteer this part of the history.  Denies recent fevers chills photophobia and nuchal rigidity.   ROS: A 14 point ROS was performed and is negative except as noted in the HPI.   Past Medical History  Diagnosis Date  . Anemia   . Asthma   . Blood transfusion 2011    r/t anemia  . Migraine   . Sickle cell trait ([place])   . Seizures [facility])     last sz Jan 2012  . Seizures ([place])   . Fibroid   . Tooth abscess   . Sickle cell trait ([place])   . Migraine     Family History  Problem Relation Age of Onset  . Anesthesia problems Neg Hx   . Hypertension Mother   . Diabetes Mother   . Cancer Mother     Colon, brain, 2 other  . Asthma Mother   . Clotting disorder Mother   . Heart disease Mother     27, stents  . Stroke Mother     2012  . Diabetes Father   . Asthma Father   . Clotting disorder Father   . Sickle cell anemia Father   . Heart disease Father     2000, stents  . Hypertension Father   . Stroke Father     2007  . Diabetes Maternal Grandmother   . Asthma Sister   . Asthma Brother   . Asthma Paternal Grandmother     Social History:  reports that she quit smoking about 2 years ago. She has never used  smokeless tobacco. She reports that she does not drink alcohol or use illicit drugs.  Exam: Current vital signs: BP 113/77 mmHg  Pulse 69  Temp(Src) 98.7 F (37.1 C) (Oral)  Resp 9  SpO2 99%  LMP 03/20/2016 Vital signs in last 24 hours: Temp:  [97.9 F (36.6 C)-98.7 F (37.1 C)] 98.7 F (37.1 C) (04/23 2025) Pulse Rate:  [54-69] 69 (04/23 2025) Resp:  [9-18] 9 (04/23 2025) BP: (100-113)/(74-89) 113/77 mmHg (04/23 2025) SpO2:  [97 %-100 %] 99 % (04/23 2025)   Physical Exam  Constitutional: Appears well-developed and well-nourished.  Psych: Affect appropriate to situation Eyes: No scleral injection HENT: No OP obstrucion Head: Normocephalic.  Cardiovascular: Normal rate and regular rhythm.  Respiratory: Effort normal and breath sounds normal to anterior ascultation GI: Soft.  No distension. There is no tenderness.  Skin: WDI  Neuro: Mental Status: Patient is somnolent but arousable and is oriented to person, place, month, year, and situation Patient is able to give a clear and coherent history No signs of aphasia or neglect Cranial Nerves: II: Visual Fields  are full. Pupils are equal, round, and reactive to light. III,IV, VI: EOMI without ptosis or diploplia.  V: Facial sensation is symmetric to temperature VII: Facial movement is symmetric.  VIII: hearing is intact to voice X: Uvula elevates symmetrically XI: Shoulder shrug is symmetric. XII: tongue is midline without atrophy or fasciculations.  Motor: Tone is normal. Bulk is normal. 5/5 strength was present in all four extremities Sensory: Sensation is symmetric to light touch and temperature in the arms and legs Deep Tendon Reflexes: 2+ and symmetric in the biceps and patellae Plantars: Toes are downgoing bilaterally. Cerebellar: FNF and HKS are intact bilaterally    I have reviewed labs in epic and the results pertinent to this consultation are: anemia and low b12   I have reviewed the images obtained:  CT head   Impression: seizures in the setting of medication non compliance.  Please restart all her seizure meds at home doses.  Replace b12.  Address anemia and vaginal bleeding.  If eeg negative the pt can be discharged with follow up from her neurologist once her medical issues are addressed.  Recommendations: 1) as above

## 2016-04-03 ENCOUNTER — Other Ambulatory Visit: Payer: Self-pay | Admitting: Obstetrics

## 2016-04-03 ENCOUNTER — Encounter: Payer: Self-pay | Admitting: *Deleted

## 2016-04-03 ENCOUNTER — Observation Stay (HOSPITAL_COMMUNITY): Payer: 59

## 2016-04-03 DIAGNOSIS — B9689 Other specified bacterial agents as the cause of diseases classified elsewhere: Secondary | ICD-10-CM

## 2016-04-03 DIAGNOSIS — R1084 Generalized abdominal pain: Secondary | ICD-10-CM | POA: Diagnosis not present

## 2016-04-03 DIAGNOSIS — F32A Depression, unspecified: Secondary | ICD-10-CM | POA: Insufficient documentation

## 2016-04-03 DIAGNOSIS — F329 Major depressive disorder, single episode, unspecified: Secondary | ICD-10-CM | POA: Diagnosis not present

## 2016-04-03 DIAGNOSIS — N760 Acute vaginitis: Principal | ICD-10-CM

## 2016-04-03 DIAGNOSIS — R569 Unspecified convulsions: Secondary | ICD-10-CM | POA: Diagnosis not present

## 2016-04-03 DIAGNOSIS — B373 Candidiasis of vulva and vagina: Secondary | ICD-10-CM

## 2016-04-03 DIAGNOSIS — B3731 Acute candidiasis of vulva and vagina: Secondary | ICD-10-CM

## 2016-04-03 LAB — BASIC METABOLIC PANEL
Anion gap: 10 (ref 5–15)
BUN: 7 mg/dL (ref 6–20)
CHLORIDE: 109 mmol/L (ref 101–111)
CO2: 22 mmol/L (ref 22–32)
CREATININE: 0.96 mg/dL (ref 0.44–1.00)
Calcium: 8.5 mg/dL — ABNORMAL LOW (ref 8.9–10.3)
GFR calc Af Amer: >60 mL/min (ref >60)
GFR calc non Af Amer: >60 mL/min (ref >60)
GLUCOSE: 95 mg/dL (ref 65–99)
Potassium: 3.3 mmol/L — ABNORMAL LOW (ref 3.5–5.1)
SODIUM: 141 mmol/L (ref 135–145)

## 2016-04-03 LAB — CBC
HCT: 31.9 % — ABNORMAL LOW (ref 36.0–46.0)
Hemoglobin: 10.5 g/dL — ABNORMAL LOW (ref 12.0–15.0)
MCH: 28.3 pg (ref 26.0–34.0)
MCHC: 32.9 g/dL (ref 30.0–36.0)
MCV: 86 fL (ref 78.0–100.0)
PLATELETS: 256 10*3/uL (ref 150–400)
RBC: 3.71 MIL/uL — ABNORMAL LOW (ref 3.87–5.11)
RDW: 12.8 % (ref 11.5–15.5)
WBC: 5.7 10*3/uL (ref 4.0–10.5)

## 2016-04-03 LAB — URINE CULTURE

## 2016-04-03 MED ORDER — FLUCONAZOLE 150 MG PO TABS: 150.0000 mg | ORAL_TABLET | Freq: Once | ORAL | Status: DC

## 2016-04-03 MED ORDER — METRONIDAZOLE 500 MG PO TABS: 500.0000 mg | ORAL_TABLET | Freq: Two times a day (BID) | ORAL | Status: DC

## 2016-04-03 NOTE — Progress Notes (Signed)
Patient had questions regarding the order for fluconazole and metronidazole. Called MD to clarify order. MD states patient was on med previously and the meds were just reordered for discharge. MD stated patient did not need to take med if not needed. Relayed info to patient and patients daughter.

## 2016-04-03 NOTE — Discharge Instructions (Signed)
Please re-start taking your Gabapentin, Keppra, and Topamax. You do not need to take the Lacosamide as you were seizure free in the past without it.  It is important that you take your medications to prevent another seizure from happening. Please schedule a hospital follow up with your neurologist.

## 2016-04-03 NOTE — Progress Notes (Signed)
Family Medicine Teaching Service Daily Progress Note Intern Pager: [phone]  Patient name: Nicole [name] Medical record number: [id number] Date of birth: [birth year] Age: 40 y.o. Gender: female  Primary Care Provider: Tawanna Sat, MD Consultants: Neruology Code Status: FULL   Pt Overview and Major Events to Date:  4.23: Admitted for seizures x3  Assessment and Plan: Nicole [name] is a 40 y.o. female presenting with seizures x3 today . PMH is significant for seizures, sickle cell trait, adjustment disorder with depressed mood and GERD.  Seizures: With known seizure disorder. Patient has been non-compliant with medications for approximately 6 weeks. Patient had 3 seizures on day of admission. Patient s/p 1500 mg Keppra load. CT head, UDS, and EtoH negative. Patient is much more awake and alert this AM.  -neuro checks q2h -neurology consulted, appreciate recs  -resume home Keppra 1000 mg BID and Topamax 75 mg daily -will not resume Lacosamide 100 mg BID as patient was not taking this and when previously compliant with other anti-epileptics she was seizure free  -EEG completed & read pending, per neuro if EEG negative can be discharged with f/u with neuro  -Keppra level pending   Abdominal Pain with DUB: Patient with h/o uterine fibroids. Patient describes pain as cramping and has been occuring with uterine bleeding. Abdominal pain likely related to severe menorrhagia. Patient seen by OB on 4/21 and given Depo Provera shot as well as Ibuprofen and Percocet prescriptions. Patient reports still actively bleeding.  -continue Ibuprofen 800 mg q8h  -hold Percocet due to potential for sedation  -continue to monitor  Anemia: Normocytic. Likely secondary to DUB. HgB 10.8 (bl 12-13).  -CBC in AM: HgB stable at 10.5  Leg Pain: Seems consistent with neuropathic pain. Present bilaterally anterior and posterior. Low suspicion for DVT given bilateral presentation, no palpable cords, and no LE  edema. Per EMR, patient takes gabapentin 300 mg twice daily and 1200 mg at bedtime. -gabapentin 300 mg TID (holding higher dose at bedtime due to possible sedation), could consider increasing dose as tolerated  -TSH, folate, and B12 all normal   Adjustment Disorder with Depressed Mood: Stable at admission.  -continue home Prozac 20 mg daily   GERD:  -Protonix in place of home Prilosec   Allergic Rhinitis:  -continue home Claritin and Flonase    FEN/GI: Regular Diet, SLIV Prophylaxis: Lovenox   Disposition: Pending EEG  Subjective:  Being set up for EEG this AM. Doing well. Still with some abdominal cramping and vaginal bleeding. Much more awake and alert this AM.   Objective: Temp:  [97.9 F (36.6 C)-98.8 F (37.1 C)] 98.8 F (37.1 C) (04/24 0339) Pulse Rate:  [54-76] 76 (04/24 0340) Resp:  [9-18] 9 (04/23 2025) BP: (94-120)/(63-89) 94/63 mmHg (04/24 0340) SpO2:  [97 %-100 %] 97 % (04/24 0340) Physical Exam: General: lying in bed, NAD, awake and alert, conversant  Cardiovascular: RRR. No murmurs appreciated.  Respiratory: CTAB. Normal WOB.  Abdomen: +BS, soft, diffusely TTP, ND, no rebound  Extremities: No LE edema.   Laboratory:  Recent Labs Lab 04/02/16 1740 04/03/16 0348  WBC 5.2 5.7  HGB 10.8* 10.5*  HCT 31.5* 31.9*  PLT 245 256    Recent Labs Lab 04/02/16 1553 04/03/16 0348  NA 142 141  K 4.1 3.3*  CL 113* 109  CO2 20* 22  BUN 7 7  CREATININE 0.73 0.96  CALCIUM 8.8* 8.5*  GLUCOSE 86 95    Imaging/Diagnostic Tests: Ct Head Wo Contrast  04/02/2016  CLINICAL  DATA:  Three seizures today. History of sickle cell anemia and seizure disorder. EXAM: CT HEAD WITHOUT CONTRAST TECHNIQUE: Contiguous axial images were obtained from the base of the skull through the vertex without intravenous contrast. COMPARISON:  MRI brain 10/20/2015.  Head CT 10/19/2015. FINDINGS: Brain: There is no evidence of acute intracranial hemorrhage, mass lesion, brain edema or  extra-axial fluid collection. The ventricles and subarachnoid spaces are appropriately sized for age. There is no CT evidence of acute cortical infarction. Empty sella turcica and chronic calcification along the posterior aspect of the falx are unchanged. Bones/sinuses/visualized face: There is mild bubbly opacity in the right division of the sphenoid sinus with interval improved aeration of ethmoid sinuses. There are no air-fluid levels. The calvarium is intact. IMPRESSION: 1. No acute intracranial findings or explanation for increased seizures. 2. New bubbly opacity in the right division of the sphenoid sinus suspicious for inflammation. Electronically Signed   By: Richardean Sale M.D.   On: 04/02/2016 16:31    Nicolette Bang, DO 04/03/2016, 6:54 AM PGY-1, [place] Intern pager: [phone], text pages welcome

## 2016-04-03 NOTE — Care Management Note (Signed)
Case Management Note  Patient Details  Name: ARLINGTON RILE MRN: [id number] Date of Birth: 12-Jul-1976  Subjective/Objective:  Patient is from home with spouse and daughter, her 40 yo daughter helps her at home if needed.  Patient uses a cane at home, otherwise indep.  She has insurance for medications and PCP.  NCM will cont to follow for dc needs.                 Action/Plan:   Expected Discharge Date:                  Expected Discharge Plan:  Home/Self Care  In-House Referral:     Discharge planning Services  CM Consult  Post Acute Care Choice:    Choice offered to:     DME Arranged:    DME Agency:     HH Arranged:    [place] Agency:     Status of Service:  Completed, signed off  Medicare Important Message Given:    Date Medicare IM Given:    Medicare IM give by:    Date Additional Medicare IM Given:    Additional Medicare Important Message give by:     If discussed at [place] of Stay Meetings, dates discussed:    Additional Comments:  Zenon Mayo, RN 04/03/2016, 11:11 AM

## 2016-04-03 NOTE — Progress Notes (Signed)
Subjective: No further seizures. Reports seizure freedom on keppra+topamax.   She reports that it is her "bipolar meds" that cause her drowsiness, not her seizure meds. But she did stop these as well.   Exam: Filed Vitals:   04/03/16 0340 04/03/16 0744  BP: 94/63 108/73  Pulse: 76 84  Temp:  98 F (36.7 C)  Resp:     Gen: In bed, NAD Resp: non-labored breathing, no acute distress Abd: soft, nt  Neuro: MS: awake, alert, CN:VFF, face symmetric Motor: symmetric grips.    Impression: 40 yo F with seizures in the setting of non-compliance.   Recommendations: 1)restart home keppra(1g BID) and topamax(75mg  BID) and gabapentin.  2) I would consider changing keppra to lamictal as this can be effective both for seizure control and bipolar disorder, but would defer to outpatient neurologist.  3) Given was seizure free when compliant with regimen previously, and was not taking lacosamide, would not restart this.  4) No further recommendations at this time, please call with any further questions.   Roland Rack, MD [facility] [phone]  If 7pm- 7am, please page neurology on call as listed in [place].

## 2016-04-03 NOTE — Discharge Summary (Signed)
[facility] Discharge Summary  Patient name: Nicole [name] Medical record number: [id number] Date of birth: 31-May-1976 Age: 40 y.o. Gender: female Date of Admission: 04/02/2016  Date of Discharge: 04/03/2016 Admitting Physician: Lupita Dawn, MD  Primary Care Provider: Tawanna Sat, MD Consultants: Neurology   Indication for Hospitalization: Seizures   Discharge Diagnoses/Problem List:  Patient Active Problem List   Diagnosis Date Noted  . 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 Depression   . Generalized abdominal pain   . Fatigue 10/28/2015  . Adjustment disorder with depressed mood 10/21/2015  . Dizziness   . Weakness 11/18/2014  . OSA (obstructive sleep apnea) 10/21/2014  . Acute rhinosinusitis 10/09/2014  . Leg pain, lateral 09/04/2014  . Migraines 08/13/2014  . Seizure ([place]) 06/04/2014  . Leiomyoma of uterus, unspecified 02/11/2014  . GERD (gastroesophageal reflux disease) 01/28/2014  . Candidiasis of vulva and vagina 01/28/2014  . Unspecified symptom associated with female genital organs 01/28/2014  . Chest pain 01/20/2014  . Acute pericarditis, unspecified 01/20/2014    Disposition: Home   Discharge Condition: Stable   Discharge Exam:  General: lying in bed, NAD, awake and alert, conversant  Cardiovascular: RRR. No murmurs appreciated.  Respiratory: CTAB. Normal WOB.  Abdomen: +BS, soft, mild diffuse TTP, ND, no rebound  Extremities: No LE edema.  Neuro: CN II-XII grossly intact. Alert and oriented x3. Strength 5/5 in all extremities.   Brief Hospital Course:  ANASTASHIA CUMBA is  40 y.o. female with PMH of seizure disorder, sickle cell trait, adjustment disorder with depressed mood, and DUB due to fibroids who presented with three episodes of seizure activity.   Seizures: Patient presented s/p three seizures. CT head, UDS, and EtOH negative. Reported non-compliance with home Keppra, Topamax, and Gabapentin. She had never started taking  Locasimide due to insurance reasons. She was given a loading dose of Keppra in the ED and mentation began to improve. Neurology was consulted who recommended re-starting home medications. EEG was performed and was normal. Neurology recommended not starting Lacosamide as patient was seizure free when compliant with previous regimen. Mentation had returned to baseline at time of discharge.   DUB:  With history of uterine fibroids. Patient presented with mild anemia to 10.8 in the setting of DUB. Was seen by outpatient Ob-GYN a couple days prior to admission and given a Depo-Provera shot. Patient reported abdominal cramping during hospitalization that was likely related to DUB. Pain improved with Ibuprofen 800 mg.   Issues for Follow Up:  1. Neurology considered changing Keppra to Lamictal as it is effective for both seizure control and bipolar disorder. Decided to defer to outpatient neurologist.  2. Patient reported polydipsia during hospitalization. HgB A1C obtained; result pending at time of discharge.  3. Needs outpatient neurology follow up. Assess for medication compliance.  4. Will defer to outpatient Ob-GYN for further work up/management of DUB.   Significant Procedures: None   Significant Labs and Imaging:   Recent Labs Lab 04/02/16 1740 04/03/16 0348  WBC 5.2 5.7  HGB 10.8* 10.5*  HCT 31.5* 31.9*  PLT 245 256    Recent Labs Lab 04/02/16 1553 04/03/16 0348  NA 142 141  K 4.1 3.3*  CL 113* 109  CO2 20* 22  GLUCOSE 86 95  BUN 7 7  CREATININE 0.73 0.96  CALCIUM 8.8* 8.5*    Ct Head Wo Contrast  04/02/2016  CLINICAL DATA:  Three seizures today. History of sickle cell anemia and seizure disorder. EXAM: CT HEAD  WITHOUT CONTRAST TECHNIQUE: Contiguous axial images were obtained from the base of the skull through the vertex without intravenous contrast. COMPARISON:  MRI brain 10/20/2015.  Head CT 10/19/2015. FINDINGS: Brain: There is no evidence of acute intracranial hemorrhage,  mass lesion, brain edema or extra-axial fluid collection. The ventricles and subarachnoid spaces are appropriately sized for age. There is no CT evidence of acute cortical infarction. Empty sella turcica and chronic calcification along the posterior aspect of the falx are unchanged. Bones/sinuses/visualized face: There is mild bubbly opacity in the right division of the sphenoid sinus with interval improved aeration of ethmoid sinuses. There are no air-fluid levels. The calvarium is intact. IMPRESSION: 1. No acute intracranial findings or explanation for increased seizures. 2. New bubbly opacity in the right division of the sphenoid sinus suspicious for inflammation. Electronically Signed   By: Richardean Sale M.D.   On: 04/02/2016 16:31    Results/Tests Pending at Time of Discharge: HgB A1C   Discharge Medications:    Medication List    STOP taking these medications        fexofenadine 180 MG tablet  Commonly known as:  ALLEGRA     Lacosamide 100 MG Tabs      TAKE these medications        acetaminophen 500 MG tablet  Commonly known as:  TYLENOL  Take 1 tablet (500 mg total) by mouth every 6 (six) hours as needed.     albuterol 108 (90 Base) MCG/ACT inhaler  Commonly known as:  PROVENTIL HFA;VENTOLIN HFA  Inhale 2 puffs into the lungs every 4 (four) hours as needed for wheezing or shortness of breath (or coughing).     fluconazole 150 MG tablet  Commonly known as:  DIFLUCAN  Take 1 tablet (150 mg total) by mouth once.     FLUoxetine 20 MG capsule  Commonly known as:  PROZAC  Take 1 capsule (20 mg total) by mouth daily.     fluticasone 50 MCG/ACT nasal spray  Commonly known as:  FLONASE  Place 2 sprays into both nostrils daily.     gabapentin 300 MG capsule  Commonly known as:  NEURONTIN  Take 1 capsule (300 mg total) by mouth 3 (three) times daily.     ibuprofen 800 MG tablet  Commonly known as:  ADVIL,MOTRIN  Take 1 tablet (800 mg total) by mouth every 8 (eight) hours as  needed for mild pain or moderate pain.     levETIRAcetam 1000 MG tablet  Commonly known as:  KEPPRA  TAKE 1 TABLET BY MOUTH TWICE A DAY     loratadine 10 MG tablet  Commonly known as:  CLARITIN  Take 1 tablet (10 mg total) by mouth daily.     medroxyPROGESTERone 150 MG/ML injection  Commonly known as:  DEPO-PROVERA  Inject 1 mL (150 mg total) into the muscle every 3 (three) months. Inject on  the 22nd of each month     metroNIDAZOLE 500 MG tablet  Commonly known as:  FLAGYL  Take 1 tablet (500 mg total) by mouth 2 (two) times daily.     omeprazole 20 MG capsule  Commonly known as:  PRILOSEC  Take 1 capsule (20 mg total) by mouth daily.     oxyCODONE-acetaminophen 10-325 MG tablet  Commonly known as:  PERCOCET  Take 1 tablet by mouth every 6 (six) hours as needed for pain.     PNV PRENATAL PLUS MULTIVITAMIN 27-1 MG Tabs  Take 1 tablet by mouth daily.  topiramate 25 MG tablet  Commonly known as:  TOPAMAX  TAKE 3 TABLETS BY MOUTH DAILY        Discharge Instructions: Please refer to Patient Instructions section of EMR for full details.  Patient was counseled important signs and symptoms that should prompt return to medical care, changes in medications, dietary instructions, activity restrictions, and follow up appointments.   Follow-Up Appointments: Follow-up Information    Follow up with [facility] on 04/07/2016.   Specialty:  Family Medicine   Why:  For Hospital Followup with Dr. Berkley Harvey at 2:45 pm   Contact information:   [address] [id number] [place] [id number] [place], DO 04/03/2016, 9:56 PM PGY-1, [place]

## 2016-04-03 NOTE — Progress Notes (Signed)
EEG completed; results pending.    

## 2016-04-03 NOTE — Progress Notes (Signed)
Discussed discharge instructions and medications with patient and patients daughter. Both verbalized understanding with all questions answered. VSS. Pt discharged home with daughter.  [facility]

## 2016-04-03 NOTE — Procedures (Signed)
HPI:  40 y/o with hx of seizure, non compliant with medication presents with seizure.  TECHNICAL SUMMARY:  A multichannel referential and bipolar montage EEG using the standard international 10-20 system was performed on the patient described as awake, drowsy and asleep.  The dominant background activity consists of 10 hertz activity seen most prominantly over the posterior head region.  The backgound activity is reactive to eye opening and closing procedures.  Low voltage fast (beta) activity is distributed symmetrically and maximally over the anterior head regions.  ACTIVATION:  Stepwise photic stimulation at 4-20 flashes per second was performed and did not elicit any abnormal waveforms.  Hyperventilation was performed for 3 minutes with fair patient effort and produced no changes in the background activity.  EPILEPTIFORM ACTIVITY:  There were no spikes, sharp waves or paroxysmal activity.  SLEEP:  Stage I and stage II sleep architecture were noted.  CARDIAC:  The EKG lead revealed a regular sinus rhythm.  IMPRESSION:  This is a normal EEG for the patients stated age.  There were no focal, hemispheric or lateralizing features.  No epileptiform activity was recorded.  A normal EEG does not exclude the diagnosis of a seizure disorder and if seizure remains high on the list of differential diagnosis, an ambulatory EEG may be of value.  Clinical correlation is required.

## 2016-04-04 LAB — HEMOGLOBIN A1C
Hgb A1c MFr Bld: 5.4 % (ref 4.8–5.6)
Mean Plasma Glucose: 108 mg/dL

## 2016-04-04 LAB — LEVETIRACETAM LEVEL: Levetiracetam Lvl: 45.6 ug/mL — ABNORMAL HIGH (ref 10.0–40.0)

## 2016-04-07 ENCOUNTER — Inpatient Hospital Stay: Payer: Self-pay | Admitting: Family Medicine

## 2016-05-24 IMAGING — CR DG CHEST 2V
2 series · 2 of 2 positions shown · non-contrast
Comparison: 07/26/2015

CLINICAL DATA: Shortness of breath for 1 week, cough.

EXAM:
CHEST  2 VIEW

[w chest pa]
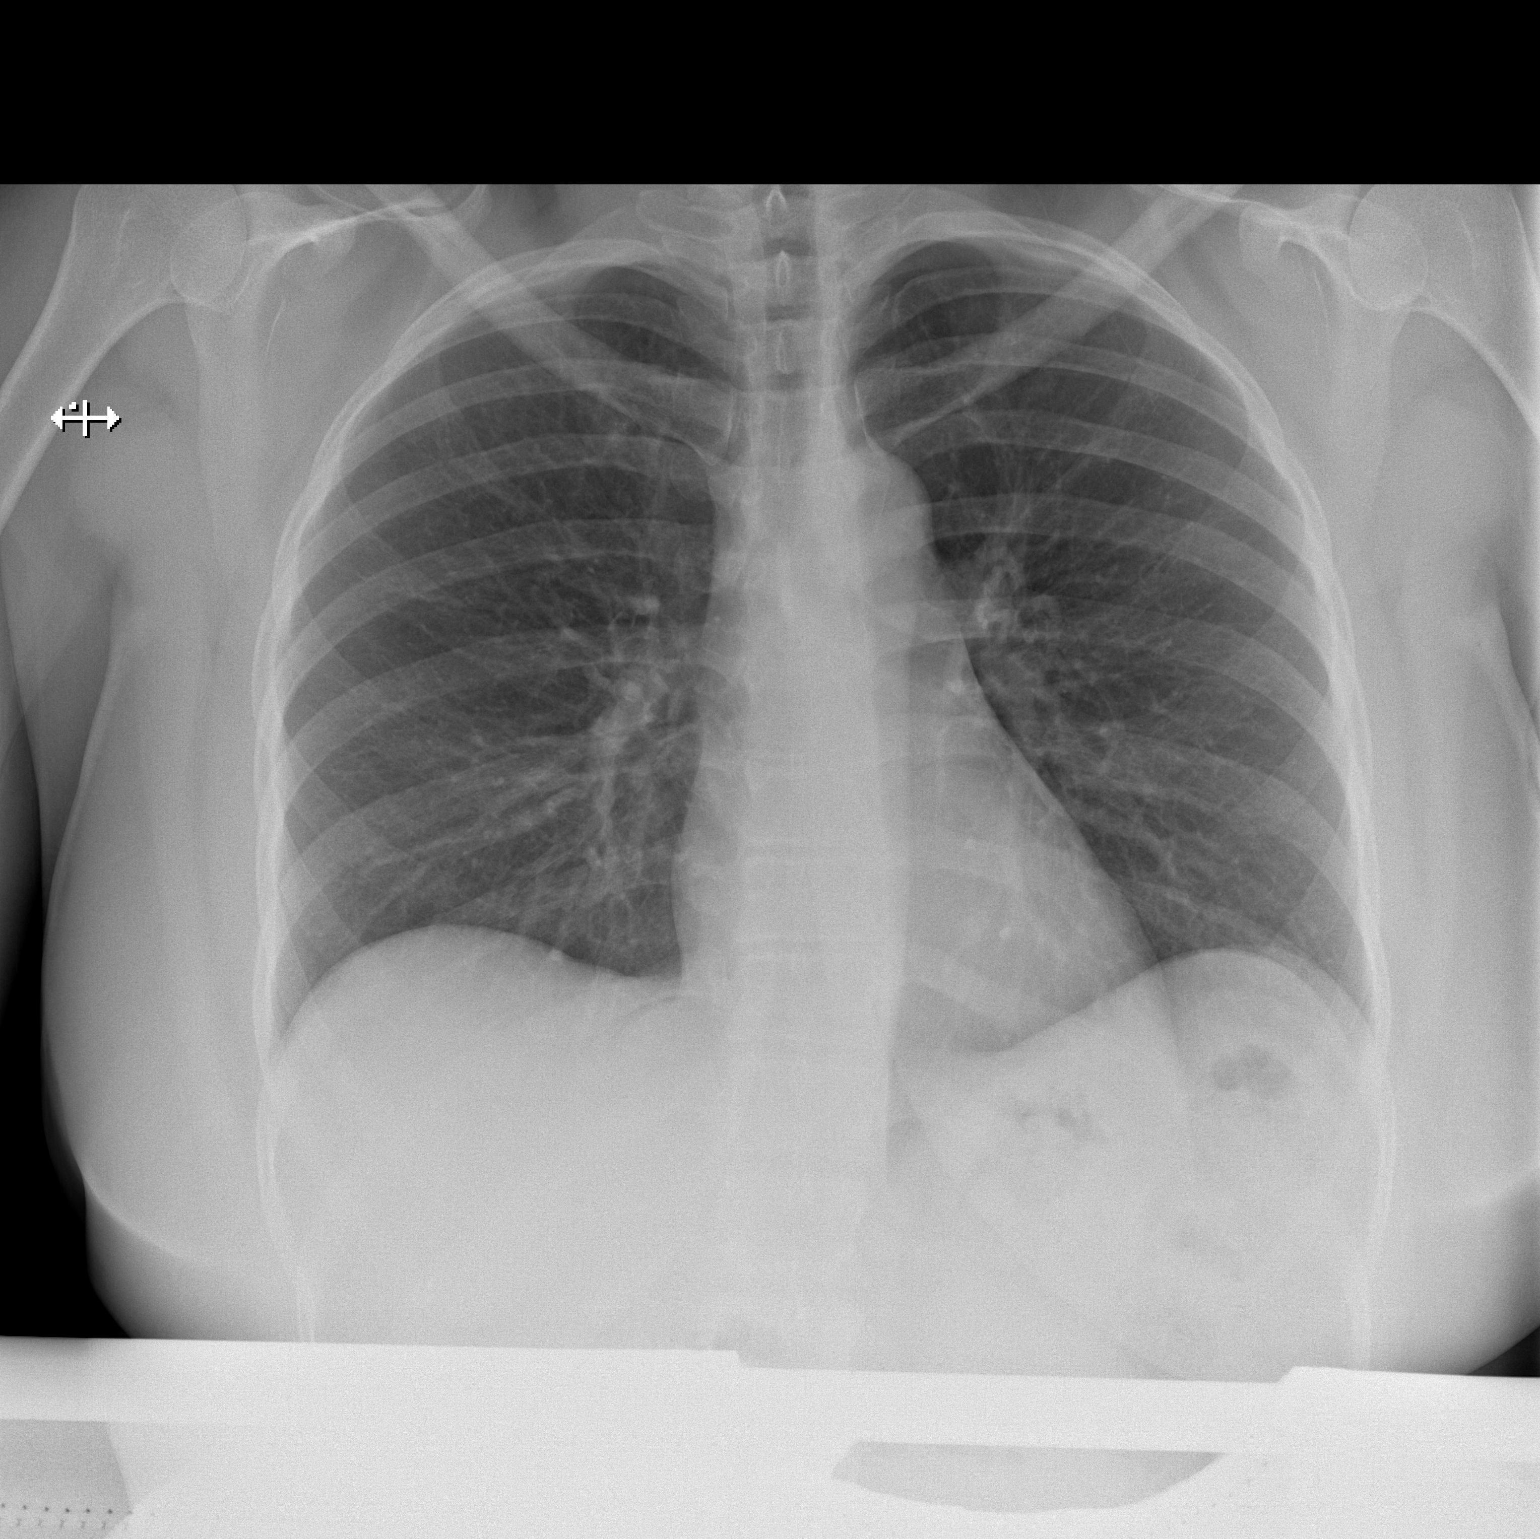

[w chest lat]
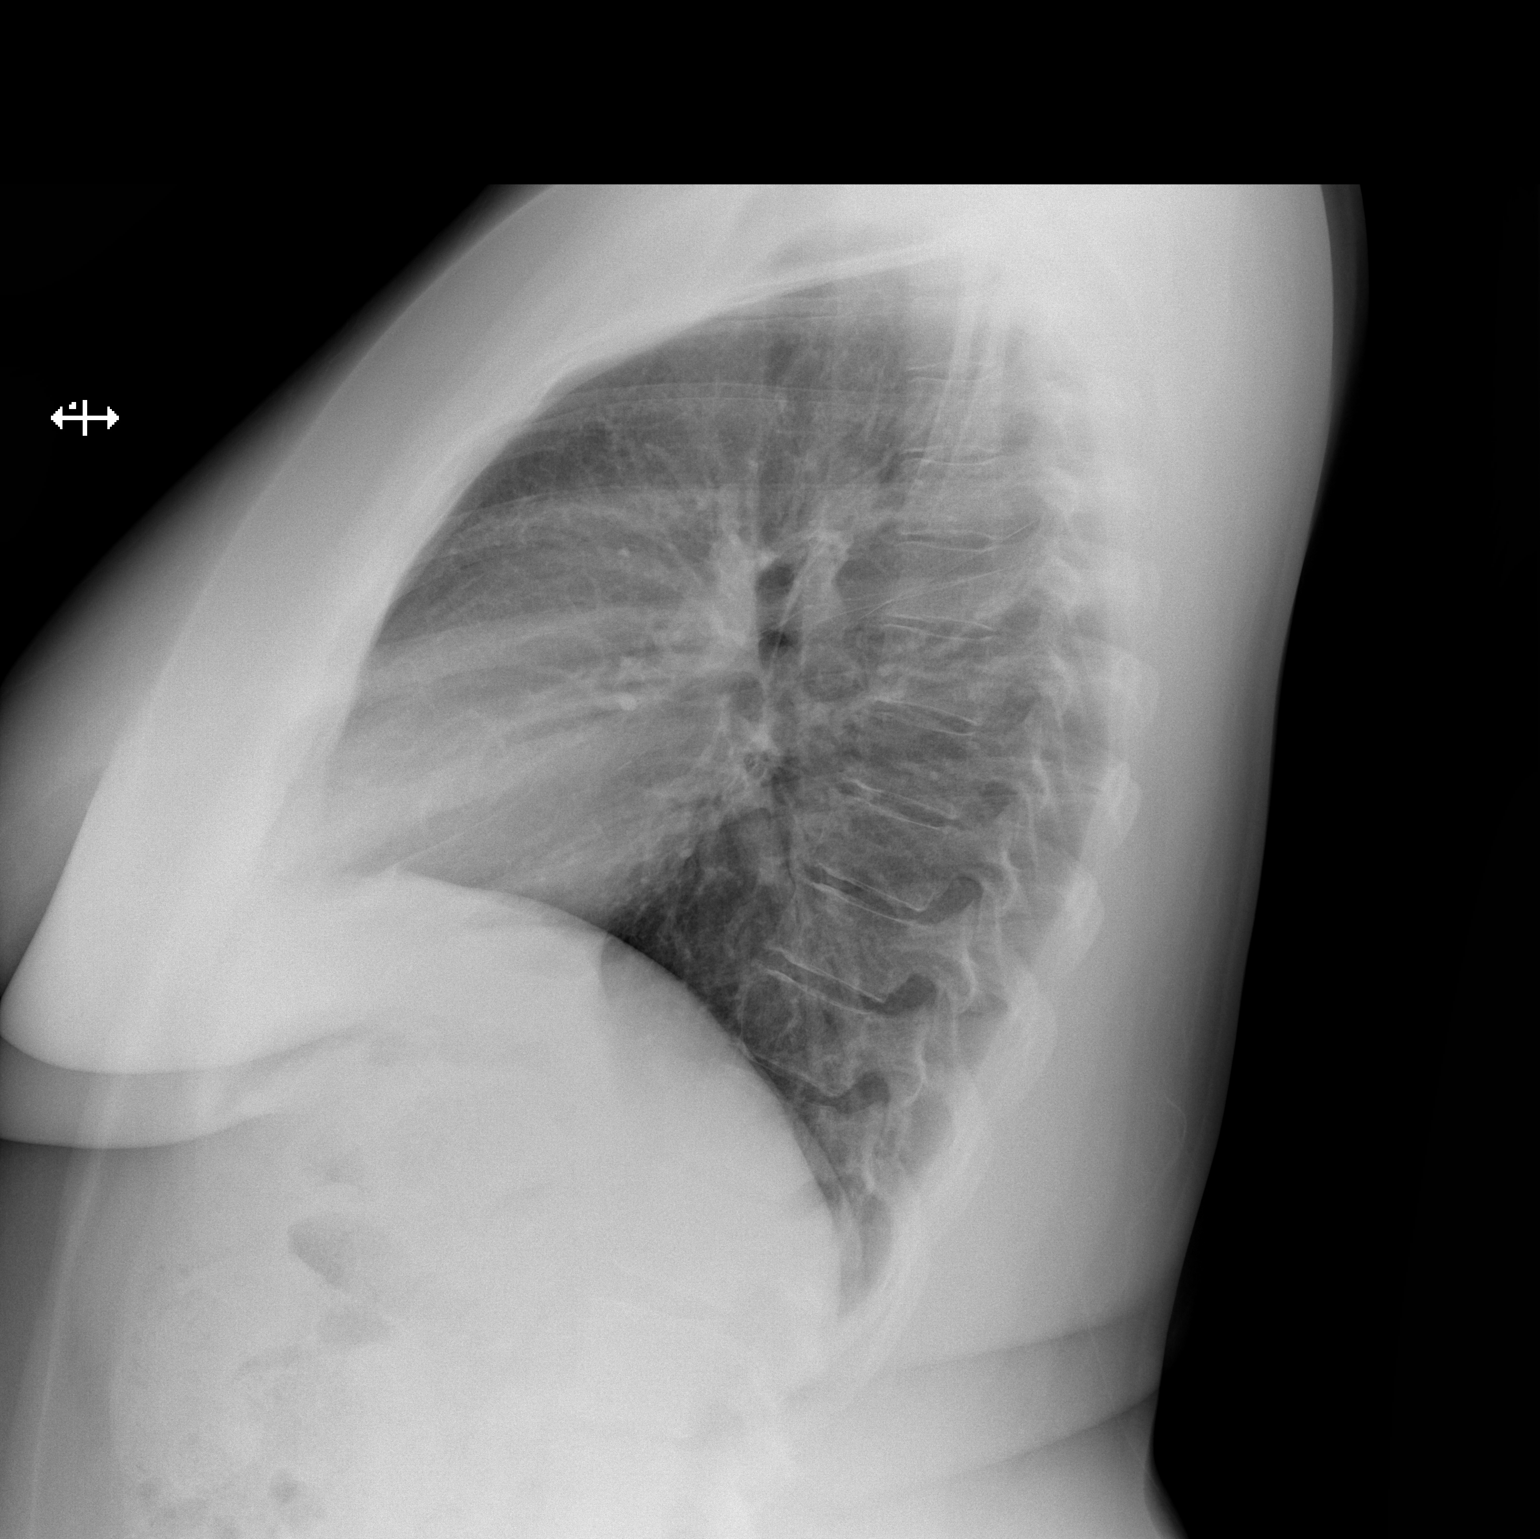

[2 of 2 positions shown; findings below may reference images not displayed]

FINDINGS: The cardiomediastinal contours are normal. The lungs are clear.
Pulmonary vasculature is normal. No consolidation, pleural effusion,
or pneumothorax. No acute osseous abnormalities are seen.
IMPRESSION: No acute pulmonary process.

## 2016-06-09 ENCOUNTER — Emergency Department (HOSPITAL_COMMUNITY)
Admission: EM | Admit: 2016-06-09 | Discharge: 2016-06-09 | Disposition: A | Discharge status: Home / Self Care | Payer: 59 | Attending: Emergency Medicine | Admitting: Emergency Medicine

## 2016-06-09 ENCOUNTER — Encounter (HOSPITAL_COMMUNITY): Payer: Self-pay | Admitting: Emergency Medicine

## 2016-06-09 ENCOUNTER — Emergency Department (HOSPITAL_COMMUNITY): Payer: 59

## 2016-06-09 DIAGNOSIS — J45909 Unspecified asthma, uncomplicated: Secondary | ICD-10-CM | POA: Insufficient documentation

## 2016-06-09 DIAGNOSIS — Z7951 Long term (current) use of inhaled steroids: Secondary | ICD-10-CM | POA: Diagnosis not present

## 2016-06-09 DIAGNOSIS — Z79899 Other long term (current) drug therapy: Secondary | ICD-10-CM | POA: Insufficient documentation

## 2016-06-09 DIAGNOSIS — M25552 Pain in left hip: Secondary | ICD-10-CM | POA: Insufficient documentation

## 2016-06-09 DIAGNOSIS — R42 Dizziness and giddiness: Secondary | ICD-10-CM | POA: Insufficient documentation

## 2016-06-09 DIAGNOSIS — M5442 Lumbago with sciatica, left side: Secondary | ICD-10-CM | POA: Insufficient documentation

## 2016-06-09 DIAGNOSIS — Z7952 Long term (current) use of systemic steroids: Secondary | ICD-10-CM | POA: Diagnosis not present

## 2016-06-09 DIAGNOSIS — Z8669 Personal history of other diseases of the nervous system and sense organs: Secondary | ICD-10-CM | POA: Diagnosis not present

## 2016-06-09 DIAGNOSIS — M545 Low back pain: Secondary | ICD-10-CM | POA: Diagnosis present

## 2016-06-09 DIAGNOSIS — W19XXXA Unspecified fall, initial encounter: Secondary | ICD-10-CM

## 2016-06-09 DIAGNOSIS — Z87891 Personal history of nicotine dependence: Secondary | ICD-10-CM | POA: Insufficient documentation

## 2016-06-09 DIAGNOSIS — M5432 Sciatica, left side: Secondary | ICD-10-CM

## 2016-06-09 LAB — BASIC METABOLIC PANEL
ANION GAP: 5 (ref 5–15)
BUN: 9 mg/dL (ref 6–20)
CHLORIDE: 115 mmol/L — AB (ref 101–111)
CO2: 20 mmol/L — AB (ref 22–32)
CREATININE: 0.94 mg/dL (ref 0.44–1.00)
Calcium: 8.8 mg/dL — ABNORMAL LOW (ref 8.9–10.3)
GFR calc Af Amer: >60 mL/min (ref >60)
Glucose, Bld: 121 mg/dL — ABNORMAL HIGH (ref 65–99)
POTASSIUM: 3.3 mmol/L — AB (ref 3.5–5.1)
Sodium: 140 mmol/L (ref 135–145)

## 2016-06-09 LAB — CBC
HCT: 32.8 % — ABNORMAL LOW (ref 36.0–46.0)
Hemoglobin: 11.4 g/dL — ABNORMAL LOW (ref 12.0–15.0)
MCH: 28.3 pg (ref 26.0–34.0)
MCHC: 34.8 g/dL (ref 30.0–36.0)
MCV: 81.4 fL (ref 78.0–100.0)
PLATELETS: 260 10*3/uL (ref 150–400)
RBC: 4.03 MIL/uL (ref 3.87–5.11)
RDW: 13.5 % (ref 11.5–15.5)
WBC: 6.6 10*3/uL (ref 4.0–10.5)

## 2016-06-09 LAB — I-STAT BETA HCG BLOOD, ED (MC, WL, AP ONLY): I-stat hCG, quantitative: <5 m[IU]/mL (ref <5)

## 2016-06-09 LAB — CBG MONITORING, ED: GLUCOSE-CAPILLARY: 120 mg/dL — AB (ref 65–99)

## 2016-06-09 MED ORDER — KETOROLAC TROMETHAMINE 60 MG/2ML IM SOLN
60.0000 mg | Freq: Once | INTRAMUSCULAR | Status: AC
  Administered 2016-06-09: 60 mg via INTRAMUSCULAR
  Filled 2016-06-09: qty 2

## 2016-06-09 MED ORDER — HYDROCODONE-ACETAMINOPHEN 5-325 MG PO TABS
1.0000 | ORAL_TABLET | Freq: Once | ORAL | Status: AC
Start: 2016-06-09 — End: 2016-06-09
  Administered 2016-06-09: 1 via ORAL
  Filled 2016-06-09: qty 1

## 2016-06-09 MED ORDER — CYCLOBENZAPRINE HCL 10 MG PO TABS: 10.0000 mg | ORAL_TABLET | Freq: Two times a day (BID) | ORAL | Status: DC | PRN

## 2016-06-09 MED ORDER — DEXAMETHASONE SODIUM PHOSPHATE 10 MG/ML IJ SOLN
10.0000 mg | Freq: Once | INTRAMUSCULAR | Status: AC
  Administered 2016-06-09: 10 mg via INTRAVENOUS
  Filled 2016-06-09: qty 1

## 2016-06-09 MED ORDER — NAPROXEN 500 MG PO TABS: 500.0000 mg | ORAL_TABLET | Freq: Three times a day (TID) | ORAL | Status: DC

## 2016-06-09 MED ORDER — CYCLOBENZAPRINE HCL 10 MG PO TABS
10.0000 mg | ORAL_TABLET | Freq: Once | ORAL | Status: AC
Start: 2016-06-09 — End: 2016-06-09
  Administered 2016-06-09: 10 mg via ORAL
  Filled 2016-06-09: qty 1

## 2016-06-09 MED ORDER — POTASSIUM CHLORIDE CRYS ER 20 MEQ PO TBCR
40.0000 meq | EXTENDED_RELEASE_TABLET | Freq: Once | ORAL | Status: AC
  Administered 2016-06-09: 40 meq via ORAL
  Filled 2016-06-09: qty 2

## 2016-06-09 NOTE — ED Notes (Signed)
Pt. Made aware for the need of urine. 

## 2016-06-09 NOTE — ED Provider Notes (Signed)
CSN: [id number]     Arrival date & time 06/09/16  1854 History   First MD Initiated Contact with Patient 06/09/16 1939     Chief Complaint  Patient presents with  . Hip Pain  . Dizziness     (Consider location/radiation/quality/duration/timing/severity/associated sxs/prior Treatment) HPI   Seizure 1 month ago and fell out of truck, was feeling weak on the way back. Hip was hurting but hadn't come to see anyone about it. Was taking 800mg  of ibuprofen, however it has worsened as time has gone on.  When bending over, lift leg. Does exercises every morning. Had to lift mother to put her in truck and pain was getting worse and worse. Hurts severely with lifting legs up.  Going down back of leg on left side-goes all the way down.  Puts heating pad on it, however this is not working. 10/10 pain.   lower part of back on left side Denies other injuries from fall, or other recent seizures. No head trauma, no neck pain.  Lightheaded today and yesterday. Nephew died on "Sunday, buried him today.  Taking it hard ,thinks that's why lightheaded. Headaches. Hx of migraines, these headaches feel like that.    Past Medical History  Diagnosis Date  . Anemia   . Asthma   . Blood transfusion 2011    r/t anemia  . Migraine   . Sickle cell trait (H[facility])   . Seizures ([facility])     last sz Jan 2012  . Seizures ([facility])   . Fibroid   . Tooth abscess   . Sickle cell trait (H[facility])   . Migraine    Past Surgical History  Procedure Laterality Date  . Tubal ligation Bilateral 2005   Family History  Problem Relation Age of Onset  . Anesthesia problems Neg Hx   . Hypertension Mother   . Diabetes Mother   . Cancer Mother     Colon, brain, 2 other  . Asthma Mother   . Clotting disorder Mother   . Heart disease Mother     1995, stents  . Stroke Mother     2012  . Diabetes Father   . Asthma Father   . Clotting disorder Father   . Sickle cell anemia Father   . Heart disease Father     2000, stents  .  Hypertension Father   . Stroke Father     20" 07  . Diabetes Maternal Grandmother   . Asthma Sister   . Asthma Brother   . Asthma Paternal Grandmother    Social History  Substance Use Topics  . Smoking status: Former Smoker    Quit date: 03/27/2014  . Smokeless tobacco: Never Used  . Alcohol Use: No   OB History    Gravida Para Term Preterm AB TAB SAB Ectopic Multiple Living   3 2 1 1 1  0 0 1 0 2     Review of Systems  Constitutional: Negative for fever.  HENT: Negative for sore throat.   Eyes: Negative for visual disturbance.  Respiratory: Negative for cough and shortness of breath.   Cardiovascular: Negative for chest pain.  Gastrointestinal: Negative for nausea, vomiting and abdominal pain.  Genitourinary: Negative for dysuria, hematuria and difficulty urinating (no difficulty with bowel/bladder).  Musculoskeletal: Positive for back pain and arthralgias. Negative for neck pain.  Skin: Negative for rash.  Neurological: Positive for light-headedness and headaches. Negative for syncope, weakness and numbness.      Allergies  Shrimp; Tea; Penicillins; and Sulfa  antibiotics  Home Medications   Prior to Admission medications   Medication Sig Start Date End Date Taking? Authorizing Provider  acetaminophen (TYLENOL) 500 MG tablet Take 1 tablet (500 mg total) by mouth every 6 (six) hours as needed. Patient taking differently: Take 500 mg by mouth every 6 (six) hours as needed for moderate pain or headache.  09/27/15  Yes Kayla Rose, PA-C  albuterol (PROVENTIL HFA;VENTOLIN HFA) 108 (90 BASE) MCG/ACT inhaler Inhale 2 puffs into the lungs every 4 (four) hours as needed for wheezing or shortness of breath (or coughing). 03/11/15  Yes Shelly Bombard, MD  FLUoxetine (PROZAC) 20 MG capsule Take 1 capsule (20 mg total) by mouth daily. 10/28/15  Yes Alyssa A Haney, MD  fluticasone (FLONASE) 50 MCG/ACT nasal spray Place 2 sprays into both nostrils daily. Patient taking differently:  Place 2 sprays into both nostrils daily as needed for allergies.  10/22/15  Yes Asiyah Cletis Media, MD  gabapentin (NEURONTIN) 300 MG capsule Take 1 capsule (300 mg total) by mouth 3 (three) times daily. Patient taking differently: Take 300-1,200 mg by mouth 3 (three) times daily. Takes one capsule twice daily and then takes four capsules at bedtime. 01/25/15  Yes Tanna Furry, MD  levETIRAcetam (KEPPRA) 1000 MG tablet TAKE 1 TABLET BY MOUTH TWICE A DAY 03/07/16  Yes Leone Brand, MD  loratadine (CLARITIN) 10 MG tablet Take 1 tablet (10 mg total) by mouth daily. Patient taking differently: Take 10 mg by mouth daily as needed for allergies.  03/11/15  Yes Shelly Bombard, MD  medroxyPROGESTERone (DEPO-PROVERA) 150 MG/ML injection Inject 1 mL (150 mg total) into the muscle every 3 (three) months. Inject on  the 22nd of each month 03/30/16  Yes Shelly Bombard, MD  omeprazole (PRILOSEC) 20 MG capsule Take 1 capsule (20 mg total) by mouth daily. Patient taking differently: Take 20 mg by mouth daily as needed (acid relfux).  07/26/15  Yes Waynetta Pean, PA-C  Prenatal Vit-Fe Fumarate-FA (PNV PRENATAL PLUS MULTIVITAMIN) 27-1 MG TABS Take 1 tablet by mouth daily. 07/22/14  Yes Historical Provider, MD  topiramate (TOPAMAX) 25 MG tablet TAKE 3 TABLETS BY MOUTH DAILY Patient taking differently: TAKE 75mg  BY MOUTH once DAILY 03/07/16  Yes Leone Brand, MD  cyclobenzaprine (FLEXERIL) 10 MG tablet Take 1 tablet (10 mg total) by mouth 2 (two) times daily as needed for muscle spasms. 06/09/16   Gareth Morgan, MD  fluconazole (DIFLUCAN) 150 MG tablet Take 1 tablet (150 mg total) by mouth once. Patient not taking: Reported on 06/09/2016 04/03/16   Shelly Bombard, MD  metroNIDAZOLE (FLAGYL) 500 MG tablet Take 1 tablet (500 mg total) by mouth 2 (two) times daily. Patient not taking: Reported on 06/09/2016 04/03/16   Shelly Bombard, MD  naproxen (NAPROSYN) 500 MG tablet Take 1 tablet (500 mg total) by mouth 3 (three)  times daily with meals. 06/09/16   Gareth Morgan, MD  oxyCODONE-acetaminophen (PERCOCET) 10-325 MG tablet Take 1 tablet by mouth every 6 (six) hours as needed for pain. Patient not taking: Reported on 06/09/2016 03/30/16   Shelly Bombard, MD   BP 99/67 mmHg  Pulse 65  Temp(Src) 97.7 F (36.5 C) (Oral)  Resp 16  SpO2 98% Physical Exam  Constitutional: She is oriented to person, place, and time. She appears well-developed and well-nourished. No distress.  HENT:  Head: Normocephalic and atraumatic.  Eyes: Conjunctivae and EOM are normal.  Neck: Normal range of motion.  Cardiovascular: Normal rate, regular  rhythm, normal heart sounds and intact distal pulses.  Exam reveals no gallop and no friction rub.   No murmur heard. Pulmonary/Chest: Effort normal and breath sounds normal. No respiratory distress. She has no wheezes. She has no rales.  Abdominal: Soft. She exhibits no distension. There is no tenderness. There is no guarding.  Musculoskeletal: She exhibits no edema.       Left hip: She exhibits decreased range of motion (reports pain but has full ROM) and tenderness (lateral hip). She exhibits no swelling and no laceration.       Lumbar back: She exhibits tenderness (severe left back/left iliac crest). She exhibits no bony tenderness, no swelling and no laceration.  Neurological: She is alert and oriented to person, place, and time. No sensory deficit. GCS eye subscore is 4. GCS verbal subscore is 5. GCS motor subscore is 6.  Reflex Scores:      Patellar reflexes are 2+ on the right side and 2+ on the left side. Hip flexion equal bilaterally Exam limited by pain, effort. Decreased dorsiflexion and plantar flexion of ankle on left however able to resist force. Able to resist force with knee flexion/extension briefly before giving way and crying with pain.   Skin: Skin is warm and dry. No rash noted. She is not diaphoretic. No erythema.  Nursing note and vitals reviewed.   ED Course   Procedures (including critical care time) Labs Review Labs Reviewed  BASIC METABOLIC PANEL - Abnormal; Notable for the following:    Potassium 3.3 (*)    Chloride 115 (*)    CO2 20 (*)    Glucose, Bld 121 (*)    Calcium 8.8 (*)    All other components within normal limits  CBC - Abnormal; Notable for the following:    Hemoglobin 11.4 (*)    HCT 32.8 (*)    All other components within normal limits  CBG MONITORING, ED - Abnormal; Notable for the following:    Glucose-Capillary 120 (*)    All other components within normal limits  I-STAT BETA HCG BLOOD, ED ([facility], [facility], [facility] ONLY)    Imaging Review Dg Lumbar Spine 2-3 Views  06/09/2016  CLINICAL DATA:  Fall 1 month ago. Persistent low back pain. Initial encounter. EXAM: LUMBAR SPINE - 2-3 VIEW COMPARISON:  08/09/2012 FINDINGS: There is no evidence of lumbar spine fracture. Alignment is normal. Intervertebral disc spaces are maintained. No focal lytic or sclerotic bone lesions identified. Mild dextroscoliosis again noted. IMPRESSION: No acute findings.  Stable mild lumbar dextroscoliosis. Electronically Signed   By: Earle Gell M.D.   On: 06/09/2016 21:53   Dg Hip Unilat With Pelvis 2-3 Views Left  06/09/2016  CLINICAL DATA:  Fall out of truck 1 month ago. Persistent left hip injury and pain. Initial encounter. EXAM: DG HIP (WITH OR WITHOUT PELVIS) 2-3V LEFT COMPARISON:  None. FINDINGS: There is no evidence of hip fracture or dislocation. There is no evidence of arthropathy or other focal bone abnormality. No pelvic fracture identified. Multiple pelvic phleboliths incidentally noted. IMPRESSION: Negative. Electronically Signed   By: Earle Gell M.D.   On: 06/09/2016 21:12   I have personally reviewed and evaluated these images and lab results as part of my medical decision-making.   EKG Interpretation   Date/Time:  Friday June 09 2016 19:12:29 EDT Ventricular Rate:  94 PR Interval:    QRS Duration: 81 QT Interval:  339 QTC Calculation:  424 R Axis:   77 Text Interpretation:  Sinus rhythm Probable  left atrial enlargement  Borderline T wave abnormalities No significant change since last tracing  Confirmed by [facility] MD, Osualdo Hansell ([id number]) on 06/09/2016 8:24:37 PM      MDM   Final diagnoses:  Fall  Left hip pain  Sciatica of left side   40 year-old female with a history of seizure disorder, sickle cell trait, migrain presents with concern for left hip pain for one month. Patient reports falling out of a truck one month ago, and has pain since the incident. X-rays of the left hip and back showed no sign of fracture. Patient with pain not specifically in hip joint,  And range of motion is adequate without fevers and doubt septic arthritis.She denies loss of control of bowel or bladder has symmetric reflexes, no bilateral weakness, and denied weakness on history.On exam, she does appear to have some weakness on the left,  However the exam is limited by pain and effort.  Have low suspicion for cauda equina, osteomyelitis, epidural abscess. Given radicular symptoms, suspect likely disc herniation recommend close outpatient follow-up and given duration of symptoms, outpatient MRI. Patient also with muscular tenderness, would consider muscularstrain as etiology of pain. Discussed reasons to return to the emergency department in detail. Provided patient with a dose of Decadron, Toradol, flexeril and Norco in the emergency department.  Gave rx for naproxen and flexeril for discharge.  Patient also reported lightheadedness for the last three days.EKG showed no significant findings.Labs show no significant anemia or electrolyte abnormalities. Pt reports this may be secondary to stress related to nephew's death.  Patient discharged in stable condition with understanding of reasons to return.    Gareth Morgan, MD 06/10/16 1253

## 2016-06-09 NOTE — Discharge Instructions (Signed)

## 2016-06-09 NOTE — ED Notes (Signed)
Pt given discharge instructions, verbalized understanding of need to follow up, medications to take at home for continued pain, and reasons to return to the ED. IV removed intact. Site clean and dry. Pain controlled 2/10 at this time. Pt and family member denied further questions or concerns at time of discharge. Pt escorted via wheelchair to exit, moving all extremities well.

## 2016-06-09 NOTE — ED Notes (Signed)
Pt c/o left hip pain that shoots into leg for a month and light headedness that started 3 days ago. Pt states she fell from a seizure and landed on the left hip and pain has increased since.

## 2016-06-16 IMAGING — CT CT HEAD W/O CM
2 series · 15 of 30 positions shown, 17 images · non-contrast
Comparison: Multiple prior head CTs.  The most recent is 01/24/2015

CLINICAL DATA: Confusion and weakness tonight.

EXAM:
CT HEAD WITHOUT CONTRAST
TECHNIQUE: Contiguous axial images were obtained from the base of the skull
through the vertex without intravenous contrast.

[Series 2: head without · axial · non-contrast · 0.43mm/px · z∈[-205,-100]mm · 7 of 29 slices shown, 9 images]
[im 4/29  brain]
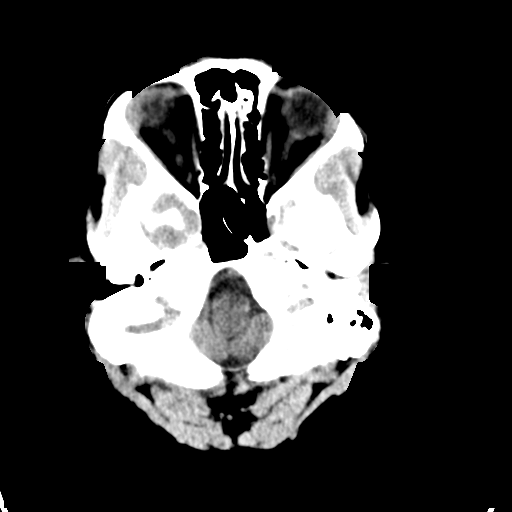
[im 4/29  bone]
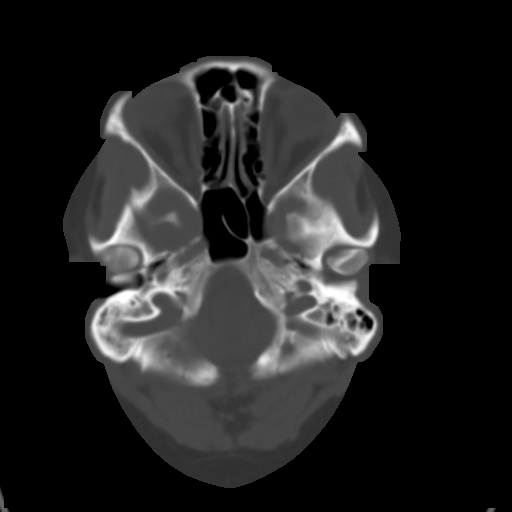
[im 8/29  brain]
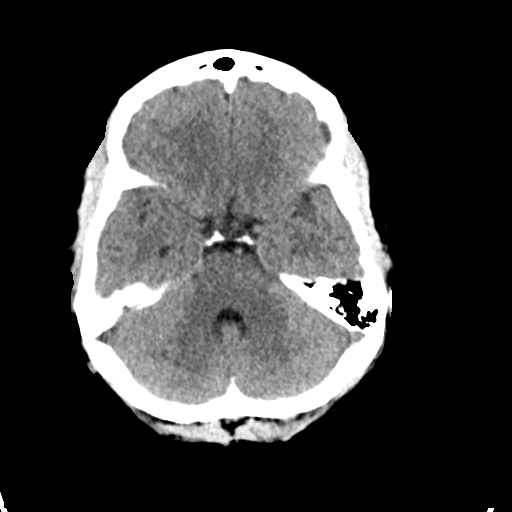
[im 11/29  brain]
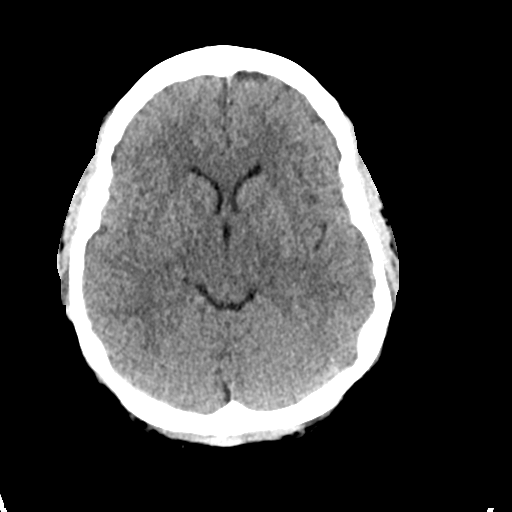
[im 15/29  brain]
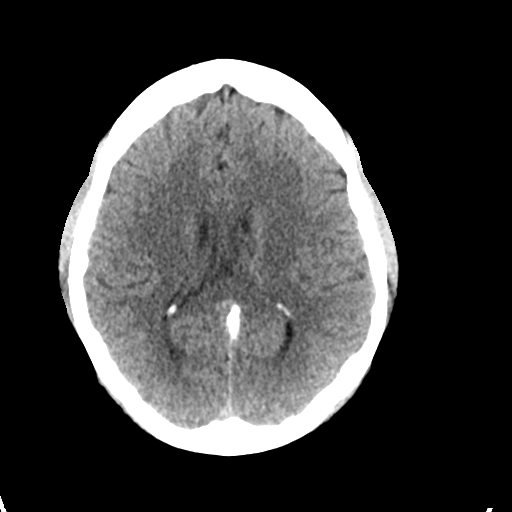
[im 18/29  brain]
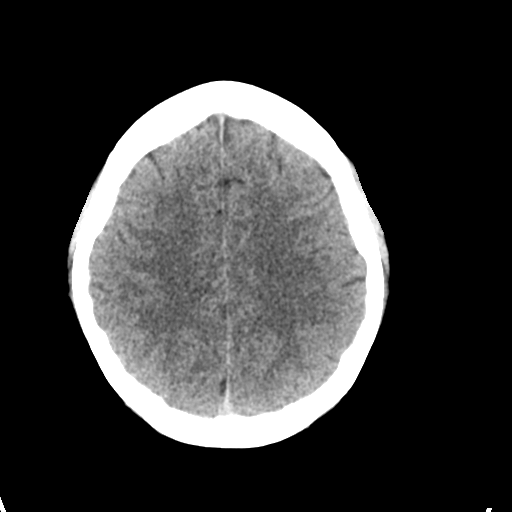
[im 18/29  bone]
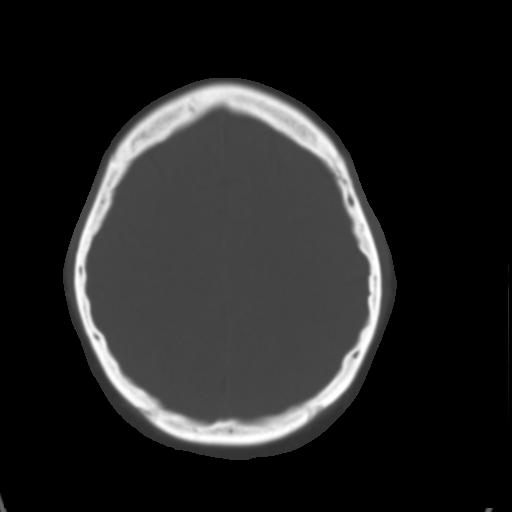
[im 22/29  brain]
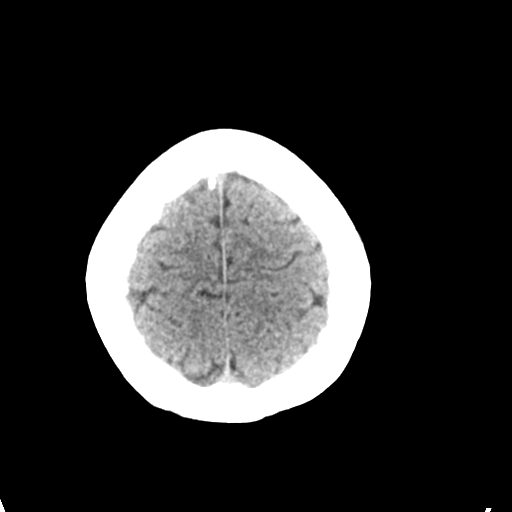
[im 25/29  brain]
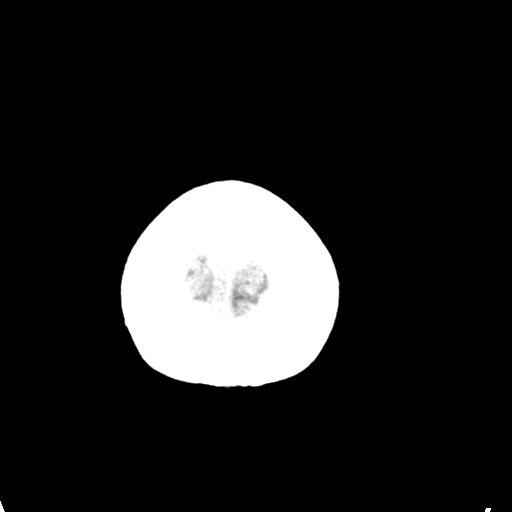

[Series 3: head bone · axial · 0.43mm/px · z∈[-206,-94]mm · 8 of 71 slices shown]
[im 8/71  bone]
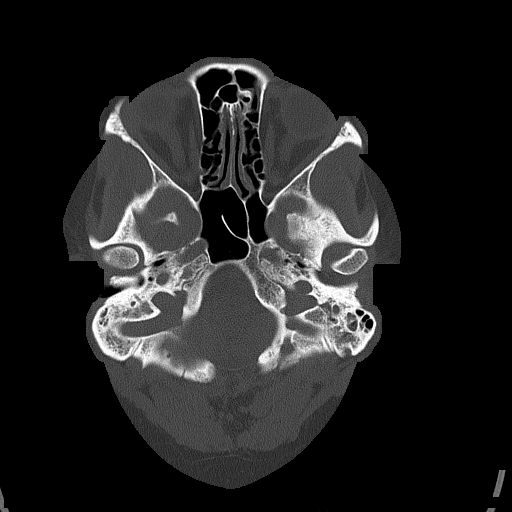
[im 15/71  bone]
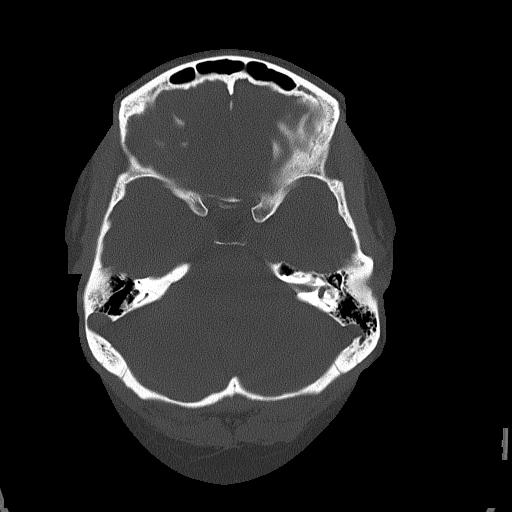
[im 22/71  bone]
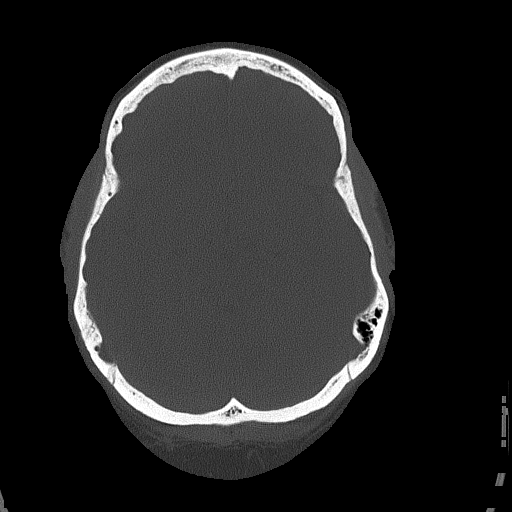
[im 32/71  bone]
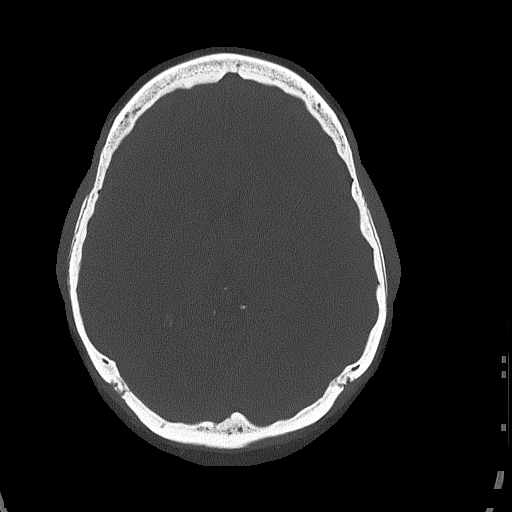
[im 39/71  bone]
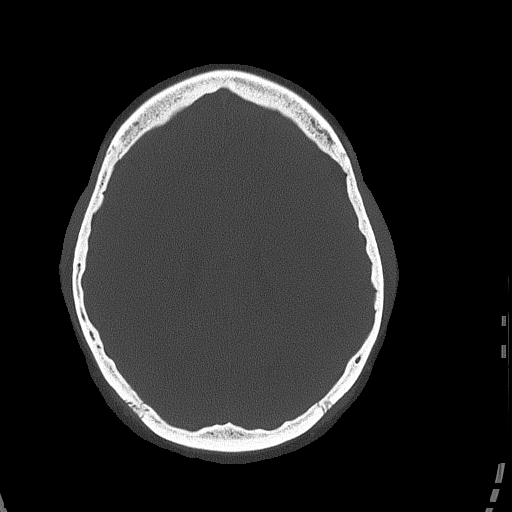
[im 50/71  bone]
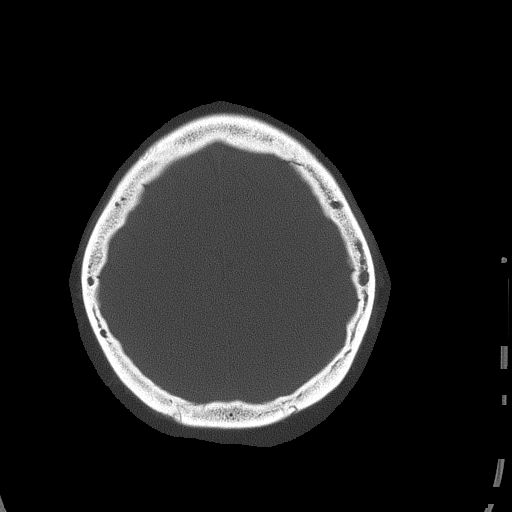
[im 57/71  bone]
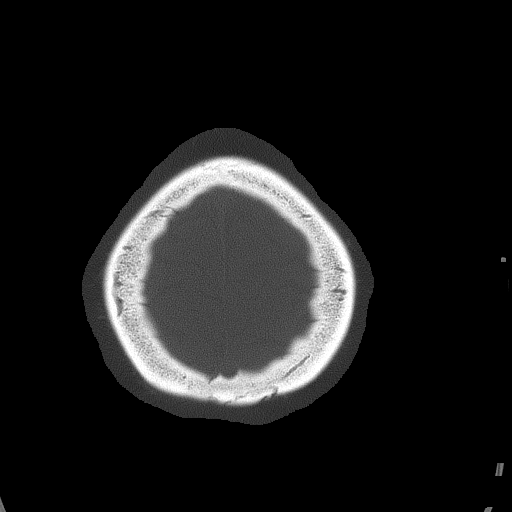
[im 64/71  bone]
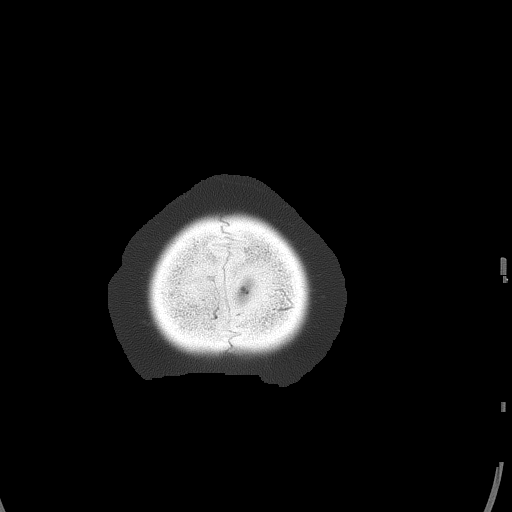

[15 of 30 positions shown; findings below may reference images not displayed]

FINDINGS: The ventricles are normal in size and configuration. No extra-axial
fluid collections are identified. The gray-white differentiation is
normal. No CT findings for acute intracranial process such as
hemorrhage or infarction. No mass lesions. Stable dural
calcification posteriorly. The brainstem and cerebellum are grossly
normal.

The bony structures are intact. The paranasal sinuses and mastoid
air cells are clear except for scattered ethmoid sinus disease. The
globes are intact.
IMPRESSION: No acute intracranial findings or mass lesion. No change since prior
head CTs.

Scattered ethmoid sinus disease.

## 2016-06-27 ENCOUNTER — Ambulatory Visit (INDEPENDENT_AMBULATORY_CARE_PROVIDER_SITE_OTHER): Payer: 59 | Admitting: Obstetrics

## 2016-06-27 ENCOUNTER — Encounter: Payer: Self-pay | Admitting: Obstetrics

## 2016-06-27 VITALS — BP 123/85 | HR 88 | Temp 98.7°F | Wt 230.8 lb

## 2016-06-27 DIAGNOSIS — N939 Abnormal uterine and vaginal bleeding, unspecified: Secondary | ICD-10-CM

## 2016-06-27 DIAGNOSIS — N946 Dysmenorrhea, unspecified: Secondary | ICD-10-CM | POA: Diagnosis not present

## 2016-06-27 MED ORDER — MEDROXYPROGESTERONE ACETATE 150 MG/ML IM SUSP
150.0000 mg | INTRAMUSCULAR | Status: AC
  Administered 2016-06-27: 150 mg via INTRAMUSCULAR

## 2016-06-27 NOTE — Progress Notes (Signed)
Patient ID: Nicole [name], female   DOB: [birth year], 40 y.o.   MRN: [id number]  Chief Complaint  Patient presents with  . Gynecologic Exam    Patient is in the office for follow up to her bleeding. Patient was sent over to the hospital and given high dose Depo which worked. She is here today for follow up and to get her next dose.    HPI Nicole [name] is a 40 y.o. female.  H/O heavy and painful periods.  High dose Depo Provera given.  Presents today for F/U and 2nd Depo injection.  No further AUB since Depo.  HPI  Past Medical History  Diagnosis Date  . Anemia   . Asthma   . Blood transfusion 2011    r/t anemia  . Migraine   . Sickle cell trait ([place])   . Seizures [facility])     last sz Jan 2012  . Seizures ([place])   . Fibroid   . Tooth abscess   . Sickle cell trait ([place])   . Migraine     Past Surgical History  Procedure Laterality Date  . Tubal ligation Bilateral 2005    Family History  Problem Relation Age of Onset  . Anesthesia problems Neg Hx   . Hypertension Mother   . Diabetes Mother   . Cancer Mother     Colon, brain, 2 other  . Asthma Mother   . Clotting disorder Mother   . Heart disease Mother     45, stents  . Stroke Mother     2012  . Diabetes Father   . Asthma Father   . Clotting disorder Father   . Sickle cell anemia Father   . Heart disease Father     2000, stents  . Hypertension Father   . Stroke Father     2007  . Diabetes Maternal Grandmother   . Asthma Sister   . Asthma Brother   . Asthma Paternal Grandmother     Social History Social History  Substance Use Topics  . Smoking status: Former Smoker    Quit date: 03/27/2014  . Smokeless tobacco: Never Used  . Alcohol Use: No    Allergies  Allergen Reactions  . Shrimp [Shellfish Allergy] Anaphylaxis  . Tea Anaphylaxis    All teas  . Penicillins Swelling    Has patient had a PCN reaction causing immediate rash, facial/tongue/throat swelling, SOB or lightheadedness with hypotension:  Yes Has patient had a PCN reaction causing severe rash involving mucus membranes or skin necrosis: Yes Has patient had a PCN reaction that required hospitalization Yes- ed visit Has patient had a PCN reaction occurring within the last 10 years: No-childhood allergy If all of the above answers are "NO", then may proceed with Cephalosporin use.   . Sulfa Antibiotics Rash    Current Outpatient Prescriptions  Medication Sig Dispense Refill  . acetaminophen (TYLENOL) 500 MG tablet Take 1 tablet (500 mg total) by mouth every 6 (six) hours as needed. (Patient taking differently: Take 500 mg by mouth every 6 (six) hours as needed for moderate pain or headache. ) 14 tablet 0  . albuterol (PROVENTIL HFA;VENTOLIN HFA) 108 (90 BASE) MCG/ACT inhaler Inhale 2 puffs into the lungs every 4 (four) hours as needed for wheezing or shortness of breath (or coughing). 1 Inhaler prn  . cyclobenzaprine (FLEXERIL) 10 MG tablet Take 1 tablet (10 mg total) by mouth 2 (two) times daily as needed for muscle spasms. 20 tablet 0  .  FLUoxetine (PROZAC) 20 MG capsule Take 1 capsule (20 mg total) by mouth daily. 30 capsule 3  . fluticasone (FLONASE) 50 MCG/ACT nasal spray Place 2 sprays into both nostrils daily. (Patient taking differently: Place 2 sprays into both nostrils daily as needed for allergies. ) 16 g 2  . gabapentin (NEURONTIN) 300 MG capsule Take 1 capsule (300 mg total) by mouth 3 (three) times daily. (Patient taking differently: Take 300-1,200 mg by mouth 3 (three) times daily. Takes one capsule twice daily and then takes four capsules at bedtime.) 90 capsule 0  . levETIRAcetam (KEPPRA) 1000 MG tablet TAKE 1 TABLET BY MOUTH TWICE A DAY 60 tablet 3  . loratadine (CLARITIN) 10 MG tablet Take 1 tablet (10 mg total) by mouth daily. (Patient taking differently: Take 10 mg by mouth daily as needed for allergies. ) 30 tablet 11  . medroxyPROGESTERone (DEPO-PROVERA) 150 MG/ML injection Inject 1 mL (150 mg total) into the  muscle every 3 (three) months. Inject on  the 22nd of each month 1 mL 3  . naproxen (NAPROSYN) 500 MG tablet Take 1 tablet (500 mg total) by mouth 3 (three) times daily with meals. 30 tablet 0  . omeprazole (PRILOSEC) 20 MG capsule Take 1 capsule (20 mg total) by mouth daily. (Patient taking differently: Take 20 mg by mouth daily as needed (acid relfux). ) 30 capsule 0  . Prenatal Vit-Fe Fumarate-FA (PNV PRENATAL PLUS MULTIVITAMIN) 27-1 MG TABS Take 1 tablet by mouth daily.    Marland Kitchen topiramate (TOPAMAX) 25 MG tablet TAKE 3 TABLETS BY MOUTH DAILY (Patient taking differently: TAKE 75mg  BY MOUTH once DAILY) 90 tablet 3  . [DISCONTINUED] ipratropium (ATROVENT) 0.06 % nasal spray Place 2 sprays into both nostrils 4 (four) times daily. (Patient not taking: Reported on 06/21/2015) 15 mL 0   Current Facility-Administered Medications  Medication Dose Route Frequency Provider Last Rate Last Dose  . medroxyPROGESTERone (DEPO-PROVERA) injection 150 mg  150 mg Intramuscular Q90 days Shelly Bombard, MD   150 mg at 06/27/16 1008    Review of Systems Review of Systems Constitutional: negative for fatigue and weight loss Respiratory: negative for cough and wheezing Cardiovascular: negative for chest pain, fatigue and palpitations Gastrointestinal: negative for abdominal pain and change in bowel habits Genitourinary:positive for heavy and painful periods Integument/breast: negative for nipple discharge Musculoskeletal:negative for myalgias Neurological: negative for gait problems and tremors Behavioral/Psych: negative for abusive relationship, depression Endocrine: negative for temperature intolerance     Blood pressure 123/85, pulse 88, temperature 98.7 F (37.1 C), weight 230 lb 12.8 oz (104.69 kg), last menstrual period 03/30/2016.  Physical Exam Physical Exam           General:  Alert and no distress Abdomen:  normal findings: no organomegaly, soft, non-tender and no hernia  Pelvis:  External  genitalia: normal general appearance Urinary system: urethral meatus normal and bladder without fullness, nontender Vaginal: normal without tenderness, induration or masses Cervix: normal appearance Adnexa: normal bimanual exam Uterus: anteverted and non-tender, normal size      Data Reviewed Labs Ultrasound  Assessment     AUB - Hormonal Imbalance.  Responded well to High Dose Depo.    Plan    Continue Depo 150 mg IM q 12 weeks   No orders of the defined types were placed in this encounter.   Meds ordered this encounter  Medications  . medroxyPROGESTERone (DEPO-PROVERA) injection 150 mg    Sig:

## 2016-06-30 ENCOUNTER — Other Ambulatory Visit: Payer: Self-pay | Admitting: Obstetrics

## 2016-06-30 DIAGNOSIS — B9689 Other specified bacterial agents as the cause of diseases classified elsewhere: Secondary | ICD-10-CM

## 2016-06-30 DIAGNOSIS — N760 Acute vaginitis: Principal | ICD-10-CM

## 2016-06-30 MED ORDER — METRONIDAZOLE 500 MG PO TABS: 500.0000 mg | ORAL_TABLET | Freq: Two times a day (BID) | ORAL | Status: DC

## 2016-07-01 ENCOUNTER — Other Ambulatory Visit: Payer: Self-pay | Admitting: Obstetrics

## 2016-07-01 DIAGNOSIS — B373 Candidiasis of vulva and vagina: Secondary | ICD-10-CM

## 2016-07-01 DIAGNOSIS — B3731 Acute candidiasis of vulva and vagina: Secondary | ICD-10-CM

## 2016-07-01 LAB — NUSWAB VG+, CANDIDA 6SP
ATOPOBIUM VAGINAE: HIGH {score} — AB
BVAB 2: HIGH {score} — AB
CANDIDA GLABRATA, NAA: NEGATIVE
CANDIDA KRUSEI, NAA: NEGATIVE
CANDIDA TROPICALIS, NAA: NEGATIVE
Candida albicans, NAA: POSITIVE — AB
Candida lusitaniae, NAA: NEGATIVE
Candida parapsilosis, NAA: NEGATIVE
Chlamydia trachomatis, NAA: NEGATIVE
NEISSERIA GONORRHOEAE, NAA: NEGATIVE
TRICH VAG BY NAA: NEGATIVE

## 2016-07-05 ENCOUNTER — Telehealth: Payer: Self-pay | Admitting: *Deleted

## 2016-07-18 ENCOUNTER — Telehealth: Payer: Self-pay

## 2016-07-18 NOTE — Telephone Encounter (Signed)
-----   Message from Shelly Bombard, MD sent at 06/30/2016  6:13 PM EDT ----- Flagyl Rx

## 2016-07-18 NOTE — Telephone Encounter (Signed)
Left message for patient to return call to the clinic for results. Kathrene Alu RNBSN

## 2016-07-19 ENCOUNTER — Emergency Department (HOSPITAL_COMMUNITY)
Admission: EM | Admit: 2016-07-19 | Discharge: 2016-07-20 | Disposition: A | Discharge status: Home / Self Care | Payer: 59 | Attending: Emergency Medicine | Admitting: Emergency Medicine

## 2016-07-19 ENCOUNTER — Encounter (HOSPITAL_COMMUNITY): Payer: Self-pay | Admitting: *Deleted

## 2016-07-19 DIAGNOSIS — M545 Low back pain: Secondary | ICD-10-CM | POA: Diagnosis present

## 2016-07-19 DIAGNOSIS — Z87891 Personal history of nicotine dependence: Secondary | ICD-10-CM | POA: Insufficient documentation

## 2016-07-19 DIAGNOSIS — M5441 Lumbago with sciatica, right side: Secondary | ICD-10-CM | POA: Diagnosis not present

## 2016-07-19 DIAGNOSIS — J45909 Unspecified asthma, uncomplicated: Secondary | ICD-10-CM | POA: Diagnosis not present

## 2016-07-19 MED ORDER — KETOROLAC TROMETHAMINE 60 MG/2ML IM SOLN
30.0000 mg | Freq: Once | INTRAMUSCULAR | Status: AC
  Administered 2016-07-19: 30 mg via INTRAMUSCULAR
  Filled 2016-07-19: qty 2

## 2016-07-19 MED ORDER — OXYCODONE-ACETAMINOPHEN 5-325 MG PO TABS
1.0000 | ORAL_TABLET | Freq: Once | ORAL | Status: AC
  Administered 2016-07-19: 1 via ORAL
  Filled 2016-07-19: qty 1

## 2016-07-19 MED ORDER — CYCLOBENZAPRINE HCL 10 MG PO TABS
10.0000 mg | ORAL_TABLET | Freq: Once | ORAL | Status: AC
  Administered 2016-07-19: 10 mg via ORAL
  Filled 2016-07-19: qty 1

## 2016-07-19 NOTE — ED Triage Notes (Signed)
Pt c/o lower back pain that radiates to right leg x3 weeks. States she fell awhile ago and was seen here for the same. States that she has been using ibuprofen, tylenol and lidocaine patch with no relief.

## 2016-07-19 NOTE — ED Notes (Addendum)
Pt made aware of need to put her in a more acute room due to weakness/numbness in legs, and intense pain.  Pt not able to stand for assessment, states pain and weakness are too intense.   NP to order pain medications and insert MSE, pt verbalized understanding of process.

## 2016-07-19 NOTE — ED Provider Notes (Signed)
MSE was initiated and I personally evaluated the patient and placed orders (if any) at  10:20 PM on July 19, 2016.    HPI Nicole [name] is a 40 y.o. female.  HPI Comments: [name] is a 40 y.o. female who presents to the Emergency Department complaining of    LOWER BACK PAIN THAT RADIATES TO HER RIGHT LEG PT [place] A[facility] PT HAD XRAY AT [facility] AND WAS SEEN A FEW DAYS AFTER  TAKEN IBUPROFEN AND TYLENOL WITH NO RELIEF LIDOCAINE PATCH NO RELIEF BACK AND HIP  STABBING PAIN PT REPORTS PAIN EXACERBATED UPON STNADING DENIES URINARY SYMPTOMS  Patient evaluated at [facility] 06/09/16 for hip and back pain. She had x-rays at that time. She reports that she had injured her back and right hip after she had a seizure and fell off the back of a truck. She states that the pain has been increasing to the point it is severe and she has difficulty walking due to the pain that radiates down her right leg and the leg giving way on her.    The history is provided by the patient. No language interpreter was used.   Patient brought to [facility] for MSE and pain management due to severe pain. Patient reports that at this time she can not stand due to the pain, numbness and tingling down her right leg. Limited exam.  Treated with  Toradol 30 mg IM, Percocet 5/325/mg po and flexeril 10 mg PO.   The patient appears stable so that the remainder of the MSE may be completed by another provider.   [address] [place], [place] 07/19/16 2224    Orlie Dakin, MD 07/20/16 [phone]

## 2016-07-20 ENCOUNTER — Emergency Department (HOSPITAL_COMMUNITY): Payer: 59

## 2016-07-20 MED ORDER — CYCLOBENZAPRINE HCL 10 MG PO TABS: 10.0000 mg | ORAL_TABLET | Freq: Three times a day (TID) | ORAL | 0 refills | Status: DC | PRN

## 2016-07-20 MED ORDER — IBUPROFEN 200 MG PO TABS
400.0000 mg | ORAL_TABLET | Freq: Once | ORAL | Status: AC
  Administered 2016-07-20: 400 mg via ORAL
  Filled 2016-07-20: qty 2

## 2016-07-20 MED ORDER — OXYCODONE-ACETAMINOPHEN 5-325 MG PO TABS: 1.0000 | ORAL_TABLET | Freq: Four times a day (QID) | ORAL | 0 refills | Status: DC | PRN

## 2016-07-20 MED ORDER — CYCLOBENZAPRINE HCL 10 MG PO TABS
10.0000 mg | ORAL_TABLET | Freq: Once | ORAL | Status: AC
  Administered 2016-07-20: 10 mg via ORAL
  Filled 2016-07-20: qty 1

## 2016-07-20 NOTE — Discharge Instructions (Signed)
Read the information below.   Your imaging shows some narrowing of your spinal canal at L3-L4. I encourage you to take motrin for pain relief. If pain is severe you can take percocet. I have also prescribed a muscle relaxer. Do not take the percocet and muscle relaxer together. Both can cause drowsiness. Also do not take percocet or muscle relaxer with gabapentin.  Use the prescribed medication as directed.  Please discuss all new medications with your pharmacist.   I have provided the contact information for Orthopedics. Please call to schedule a follow up appointment for further evaluation and management.  You may return to the Emergency Department at any time for worsening condition or any new symptoms that concern you. Return to ED if your symptoms worsen or you develop a fever, loss of bowel or bladder function, or inability to walk.

## 2016-07-20 NOTE — ED Notes (Signed)
Reassured patient in the lobby of itchiness being possible normal side effect of percocet.  Pt denies any SOB, throat swelling, etc.

## 2016-07-20 NOTE — ED Notes (Signed)
Patient sleeping. Repeatedly woke patient to ask questions for assessment. Patient irritable & stopped answering questions to return to sleep.

## 2016-07-20 NOTE — ED Provider Notes (Signed)
[facility] DEPT Provider Note   CSN: [id number] Arrival date & time: 07/19/16  2101  First Provider Contact:  First MD Initiated Contact with Patient 07/19/16 2201        History   Chief Complaint Chief Complaint  Patient presents with  . Back Pain    HPI Nicole [name] is a 40 y.o. female.  Nicole [name] is a 40 y.o. female with history of seizure, anemia, asthma, and sickle cell trait presents to ED with complaint of worsening low back pain with radiation into right lower extremity. Per review of records and patient, 06/09/16, patient was seen in ED for back pain with radiation into left lower extremity following a fall from a truck bed during a seizure one month prior to her ED visit. Plain films at the time were negative for fracture or dislocation. She was treated symptomatically. Patient returns today with worsening low back pain with intermittent numbness, paraesthesias, and weakness in right lower extremity.  She denies any new trauma. No fever, h/o cancer, h/o IVDU, loss of bowel or bladder function, or saddle anesthesia. She is able to ambulate; however, painful. She has tried ibuprofen, tylenol, aleeve, and lidocaine patches with minimal relief. She denies dysuria or hematuria. No abdominal complaints. No LOC. She has not had any follow up with PCP.       Past Medical History:  Diagnosis Date  . Anemia   . Asthma   . Blood transfusion 2011   r/t anemia  . Fibroid   . Migraine   . Migraine   . Seizures [facility])    last sz Jan 2012  . Seizures ([place])   . Sickle cell trait ([place])   . Sickle cell trait ([place])   . Tooth abscess     Patient Active Problem List   Diagnosis Date Noted  . 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 Depression   . Generalized abdominal pain   . Fatigue 10/28/2015  . Adjustment disorder with depressed mood 10/21/2015  . Dizziness   . Weakness 11/18/2014  . OSA (obstructive sleep apnea) 10/21/2014  . Acute rhinosinusitis 10/09/2014  . Leg pain, lateral  09/04/2014  . Migraines 08/13/2014  . Seizure ([place]) 06/04/2014  . Leiomyoma of uterus, unspecified 02/11/2014  . GERD (gastroesophageal reflux disease) 01/28/2014  . Candidiasis of vulva and vagina 01/28/2014  . Unspecified symptom associated with female genital organs 01/28/2014  . Chest pain 01/20/2014  . Acute pericarditis, unspecified 01/20/2014    Past Surgical History:  Procedure Laterality Date  . TUBAL LIGATION Bilateral 2005    OB History    Gravida Para Term Preterm AB Living   3 2 1 1 1 2    SAB TAB Ectopic Multiple Live Births   0 0 1 0 2       Home Medications    Prior to Admission medications   Medication Sig Start Date End Date Taking? Authorizing Provider  acetaminophen (TYLENOL) 500 MG tablet Take 1 tablet (500 mg total) by mouth every 6 (six) hours as needed. Patient taking differently: Take 500 mg by mouth every 6 (six) hours as needed for moderate pain or headache.  09/27/15  Yes Kayla Rose, PA-C  albuterol (PROVENTIL HFA;VENTOLIN HFA) 108 (90 BASE) MCG/ACT inhaler Inhale 2 puffs into the lungs every 4 (four) hours as needed for wheezing or shortness of breath (or coughing). 03/11/15  Yes Shelly Bombard, MD  FLUoxetine (PROZAC) 20 MG capsule Take 1 capsule (20 mg total) by mouth daily. 10/28/15  Yes Veatrice Bourbon, MD  gabapentin (NEURONTIN) 300 MG capsule Take 1 capsule (300 mg total) by mouth 3 (three) times daily. Patient taking differently: Take 300-1,200 mg by mouth 3 (three) times daily. Takes one capsule twice daily and then takes four capsules at bedtime. 01/25/15  Yes Tanna Furry, MD  levETIRAcetam (KEPPRA) 1000 MG tablet TAKE 1 TABLET BY MOUTH TWICE A DAY 03/07/16  Yes Leone Brand, MD  medroxyPROGESTERone (DEPO-PROVERA) 150 MG/ML injection Inject 1 mL (150 mg total) into the muscle every 3 (three) months. Inject on  the 22nd of each month 03/30/16  Yes Shelly Bombard, MD  topiramate (TOPAMAX) 25 MG tablet TAKE 3 TABLETS BY MOUTH DAILY Patient  taking differently: TAKE 75mg  BY MOUTH once DAILY 03/07/16  Yes Leone Brand, MD  cyclobenzaprine (FLEXERIL) 10 MG tablet Take 1 tablet (10 mg total) by mouth 3 (three) times daily as needed for muscle spasms. 07/20/16   Roxanna Mew, PA-C  oxyCODONE-acetaminophen (PERCOCET/ROXICET) 5-325 MG tablet Take 1 tablet by mouth every 6 (six) hours as needed for severe pain. 07/20/16   Roxanna Mew, PA-C    Family History Family History  Problem Relation Age of Onset  . Hypertension Mother   . Diabetes Mother   . Cancer Mother     Colon, brain, 2 other  . Asthma Mother   . Clotting disorder Mother   . Heart disease Mother     8, stents  . Stroke Mother     2012  . Diabetes Father   . Asthma Father   . Clotting disorder Father   . Sickle cell anemia Father   . Heart disease Father     2000, stents  . Hypertension Father   . Stroke Father     2007  . Diabetes Maternal Grandmother   . Asthma Sister   . Asthma Brother   . Asthma Paternal Grandmother   . Anesthesia problems Neg Hx     Social History Social History  Substance Use Topics  . Smoking status: Former Smoker    Quit date: 03/27/2014  . Smokeless tobacco: Never Used  . Alcohol use No     Allergies   Shrimp [shellfish allergy]; Tea; Penicillins; and Sulfa antibiotics   Review of Systems Review of Systems  Musculoskeletal: Positive for back pain.  Neurological: Positive for weakness ( right) and numbness ( right).  All other systems reviewed and are negative.    Physical Exam Updated Vital Signs BP 117/80   Pulse 62   Temp 98.6 F (37 C) (Oral)   Resp 20   Ht 5\' 5"  (1.651 m)   Wt 102.1 kg   LMP 03/30/2016 (Approximate)   SpO2 98%   BMI 37.44 kg/m   Physical Exam  Constitutional: She appears well-developed and well-nourished. She appears distressed.  HENT:  Head: Normocephalic and atraumatic.  Mouth/Throat: Oropharynx is clear and moist. No oropharyngeal exudate.  Eyes: Conjunctivae  and EOM are normal. Pupils are equal, round, and reactive to light. Right eye exhibits no discharge. Left eye exhibits no discharge. No scleral icterus.  Neck: Normal range of motion. Neck supple.  Cardiovascular: Normal rate, regular rhythm, normal heart sounds and intact distal pulses.   No murmur heard. Pulmonary/Chest: Effort normal and breath sounds normal. No respiratory distress.  Abdominal: Soft. Bowel sounds are normal. There is no tenderness. There is no rebound and no guarding.  Musculoskeletal: Normal range of motion.  TTP of lumbar spine and paravertebral muscles b/l. +  SLR on right.   Lymphadenopathy:    She has no cervical adenopathy.  Neurological: She is alert. She is not disoriented. Coordination normal. GCS eye subscore is 4. GCS verbal subscore is 5. GCS motor subscore is 6.  Mental Status:  Alert, thought content appropriate, able to give a coherent history. Speech fluent without evidence of aphasia. Able to follow 2 step commands without difficulty.  Cranial Nerves:  II:  Peripheral visual fields grossly normal, pupils equal, round, reactive to light III,IV, VI: ptosis not present, extra-ocular motions intact bilaterally  V,VII: smile symmetric, facial light touch sensation equal VIII: hearing grossly normal to voice  X: uvula elevates symmetrically  XI: bilateral shoulder shrug symmetric and strong XII: midline tongue extension without fassiculations Motor:  Normal tone. 5/5 strength in upper extremities b/l with strong equal grip. Patient able to lift lower extremities. Decrease dorsiflexion/plantar flexion on right; however, able to resist against force.  Sensory: decrease sensation to light touch in right lower extremity compared to left Cerebellar: normal finger-to-nose with bilateral upper extremities Gait: patient able to ambulate; however, favoring right side. CV: distal pulses palpable throughout   Skin: Skin is warm and dry. She is not diaphoretic.    Psychiatric: She has a normal mood and affect.     ED Treatments / Results  Labs (all labs ordered are listed, but only abnormal results are displayed) Labs Reviewed - No data to display  EKG  EKG Interpretation None       Radiology Mr Lumbar Spine Wo Contrast  Result Date: 07/20/2016 CLINICAL DATA:  Low back pain with right leg pain EXAM: MRI LUMBAR SPINE WITHOUT CONTRAST TECHNIQUE: Multiplanar, multisequence MR imaging of the lumbar spine was performed. No intravenous contrast was administered. COMPARISON:  Lumbar radiographs 06/09/2016 FINDINGS: Segmentation:  Normal segmentation.  Lowest disc space L5-S1. Alignment:  Normal alignment.  Mild dextroscoliosis. Vertebrae:  Negative for fracture or mass.  Normal bone marrow. Conus medullaris: Extends to the mid L1 Level and appears normal. Paraspinal and other soft tissues: Paraspinous muscles normal. No retroperitoneal mass or adenopathy. Disc levels: L1-2:  Negative. L2-3:  Negative L3-4: Small left foraminal and extra foraminal disc protrusion . Mild facet degeneration. Mild spinal stenosis. Right foramen is patent. L4-5: Negative L5-S1: Negative IMPRESSION: Small left-sided disc protrusion L3-4 extending lateral to the foramen. Mild spinal stenosis L3-4. Otherwise  negative Electronically Signed   By: Franchot Gallo M.D.   On: 07/20/2016 07:17    Procedures Procedures (including critical care time)  Medications Ordered in ED Medications  cyclobenzaprine (FLEXERIL) tablet 10 mg (10 mg Oral Given 07/19/16 2216)  ketorolac (TORADOL) injection 30 mg (30 mg Intramuscular Given 07/19/16 2216)  oxyCODONE-acetaminophen (PERCOCET/ROXICET) 5-325 MG per tablet 1 tablet (1 tablet Oral Given 07/19/16 2217)  cyclobenzaprine (FLEXERIL) tablet 10 mg (10 mg Oral Given 07/20/16 0601)  ibuprofen (ADVIL,MOTRIN) tablet 400 mg (400 mg Oral Given 07/20/16 0802)     Initial Impression / Assessment and Plan / ED Course  I have reviewed the triage vital signs  and the nursing notes.  Pertinent labs & imaging results that were available during my care of the patient were reviewed by me and considered in my medical decision making (see chart for details).  Clinical Course  Value Comment By Time  MR Lumbar Spine Wo Contrast Reviewed Roxanna Mew, PA-C 08/10 0730    Patient is afebrile and non-toxic appearing, appears younger than stated age and in obvious discomfort. Patient given toradol, flexeril, and percocet in  triage with mild improvement in pain. Physical exam remarkable for TTP of lumbar spine and paravertebral muscles b/l. She is positive SLR on right. Decrease light touch sensation on right side. Decrease plantar and dorsiflexion strength; however, able to resist against force. Patient is ambulatory; however, favors right side. Given duration of sxs and decrease sensation will MRI lumbar spine to r/o spinal pathology. Muscle relaxer in ED.   MRI lumbar spine remarkable for mild spinal stenosis at L3-L4. No evidence of conus medullaris, cauda equina, or epidural abscess. Patient endorses slight improvement in pain. Discussed results with patient. Discussed symptomatic management. Rx muscle relaxer and percocet for severe pain. Review of  controlled substance database shows last narcotic Rx 03/30/16 for percocet for 10 days. Discussed spacing out medications that can be sedating.  Encouraged patient to follow up with orthopedics for further evaluation and management. Discussed return precautions. Patient voiced understanding and is agreeable.   Final Clinical Impressions(s) / ED Diagnoses   Final diagnoses:  Right-sided low back pain with right-sided sciatica    New Prescriptions Discharge Medication List as of 07/20/2016  7:43 AM    START taking these medications   Details  oxyCODONE-acetaminophen (PERCOCET/ROXICET) 5-325 MG tablet Take 1 tablet by mouth every 6 (six) hours as needed for severe pain., Starting Thu 07/20/2016, Print          [place], PA-C 07/20/16 1459    Ripley Fraise, MD 07/20/16 [phone]

## 2016-08-02 ENCOUNTER — Telehealth: Payer: Self-pay | Admitting: *Deleted

## 2016-08-02 NOTE — Telephone Encounter (Signed)
Phone message hasd been left for this encounter.

## 2016-08-02 NOTE — Telephone Encounter (Signed)
Phone message was left for this encounter.

## 2016-08-02 NOTE — Telephone Encounter (Signed)
Talked with Nicole [name] this AM after several attempts.Patient did pick up her medications from the pharmacy when first prescribed and completed it.She states she now has a yeast infection and I told her some medication for it will be called in to [facility] on [address] [phone].Terazol 3 as directed and Diflucan 150 mg po x 1 with 1 refill.

## 2016-08-03 ENCOUNTER — Telehealth: Payer: Self-pay | Admitting: *Deleted

## 2016-08-03 NOTE — Telephone Encounter (Signed)
Called and left message that her medications have been called to her pharmacy.

## 2016-08-08 ENCOUNTER — Other Ambulatory Visit: Payer: Self-pay | Admitting: Orthopedic Surgery

## 2016-08-08 DIAGNOSIS — S335XXA Sprain of ligaments of lumbar spine, initial encounter: Secondary | ICD-10-CM

## 2016-09-04 ENCOUNTER — Encounter (HOSPITAL_COMMUNITY): Payer: Self-pay

## 2016-09-04 ENCOUNTER — Emergency Department (HOSPITAL_COMMUNITY)
Admission: EM | Admit: 2016-09-04 | Discharge: 2016-09-04 | Disposition: A | Discharge status: Home / Self Care | Payer: 59 | Attending: Emergency Medicine | Admitting: Emergency Medicine

## 2016-09-04 DIAGNOSIS — G8929 Other chronic pain: Secondary | ICD-10-CM | POA: Diagnosis not present

## 2016-09-04 DIAGNOSIS — M791 Myalgia: Secondary | ICD-10-CM | POA: Insufficient documentation

## 2016-09-04 DIAGNOSIS — J45909 Unspecified asthma, uncomplicated: Secondary | ICD-10-CM | POA: Diagnosis not present

## 2016-09-04 DIAGNOSIS — M25551 Pain in right hip: Secondary | ICD-10-CM | POA: Insufficient documentation

## 2016-09-04 DIAGNOSIS — Z87891 Personal history of nicotine dependence: Secondary | ICD-10-CM | POA: Diagnosis not present

## 2016-09-04 DIAGNOSIS — R6889 Other general symptoms and signs: Secondary | ICD-10-CM

## 2016-09-04 DIAGNOSIS — Z79899 Other long term (current) drug therapy: Secondary | ICD-10-CM | POA: Insufficient documentation

## 2016-09-04 DIAGNOSIS — R112 Nausea with vomiting, unspecified: Secondary | ICD-10-CM | POA: Diagnosis present

## 2016-09-04 LAB — URINALYSIS, ROUTINE W REFLEX MICROSCOPIC
Bilirubin Urine: NEGATIVE
GLUCOSE, UA: NEGATIVE mg/dL
Hgb urine dipstick: NEGATIVE
KETONES UR: NEGATIVE mg/dL
LEUKOCYTES UA: NEGATIVE
NITRITE: NEGATIVE
PROTEIN: NEGATIVE mg/dL
Specific Gravity, Urine: 1.004 — ABNORMAL LOW (ref 1.005–1.030)
pH: 6 (ref 5.0–8.0)

## 2016-09-04 LAB — COMPREHENSIVE METABOLIC PANEL
ALK PHOS: 69 U/L (ref 38–126)
ALT: 17 U/L (ref 14–54)
AST: 20 U/L (ref 15–41)
Albumin: 3.6 g/dL (ref 3.5–5.0)
Anion gap: 8 (ref 5–15)
BILIRUBIN TOTAL: 0.2 mg/dL — AB (ref 0.3–1.2)
BUN: 8 mg/dL (ref 6–20)
CALCIUM: 8.8 mg/dL — AB (ref 8.9–10.3)
CHLORIDE: 110 mmol/L (ref 101–111)
CO2: 22 mmol/L (ref 22–32)
CREATININE: 0.62 mg/dL (ref 0.44–1.00)
Glucose, Bld: 97 mg/dL (ref 65–99)
Potassium: 3.3 mmol/L — ABNORMAL LOW (ref 3.5–5.1)
Sodium: 140 mmol/L (ref 135–145)
TOTAL PROTEIN: 6.8 g/dL (ref 6.5–8.1)

## 2016-09-04 LAB — CBC
HCT: 33.5 % — ABNORMAL LOW (ref 36.0–46.0)
Hemoglobin: 11.2 g/dL — ABNORMAL LOW (ref 12.0–15.0)
MCH: 28.8 pg (ref 26.0–34.0)
MCHC: 33.4 g/dL (ref 30.0–36.0)
MCV: 86.1 fL (ref 78.0–100.0)
PLATELETS: 293 10*3/uL (ref 150–400)
RBC: 3.89 MIL/uL (ref 3.87–5.11)
RDW: 13.6 % (ref 11.5–15.5)
WBC: 7.1 10*3/uL (ref 4.0–10.5)

## 2016-09-04 LAB — LIPASE, BLOOD: Lipase: 41 U/L (ref 11–51)

## 2016-09-04 MED ORDER — KETOROLAC TROMETHAMINE 30 MG/ML IJ SOLN
30.0000 mg | Freq: Once | INTRAMUSCULAR | Status: AC
  Administered 2016-09-04: 30 mg via INTRAVENOUS
  Filled 2016-09-04: qty 1

## 2016-09-04 MED ORDER — SODIUM CHLORIDE 0.9 % IV BOLUS (SEPSIS)
1000.0000 mL | Freq: Once | INTRAVENOUS | Status: AC
  Administered 2016-09-04: 1000 mL via INTRAVENOUS

## 2016-09-04 MED ORDER — ONDANSETRON HCL 4 MG/2ML IJ SOLN: 4.0000 mg | Freq: Once | INTRAMUSCULAR | Status: DC | PRN

## 2016-09-04 NOTE — ED Notes (Signed)
Patient A & O x4.  Patient noted to be drowsy but, able to answer questions.  Breathing even and unlabored.  NAD at this time.

## 2016-09-04 NOTE — ED Notes (Signed)
Patient d/c'd self care.  F/U and medications reviewed.  Patient verbalized understanding. 

## 2016-09-04 NOTE — ED Notes (Signed)
Bed: [id number] Expected date:  Expected time:  Means of arrival:  Comments: EMS 40 yo female fever and body aches-Temp 101-vomiting/zofran

## 2016-09-04 NOTE — ED Notes (Signed)
0030 - patient ambulatory with assistance to restroom.

## 2016-09-04 NOTE — ED Triage Notes (Signed)
Patient bib [facility] from home.  Patient c/o N/V x2 today with reports of a fever of 101 @ home which she has been taking tylenol for.  Patient also c/o generalized body aches, denies recent falls.

## 2016-09-04 NOTE — ED Provider Notes (Signed)
[place] DEPT Provider Note   CSN: [id number] Arrival date & time: 09/04/16  0030  By signing my name below, I, Nicole [name], attest that this documentation has been prepared under the direction and in the presence of Charlann Lange, PA-C. Electronically Signed: Estanislado [name], Scribe. 09/04/2016. 1:09 AM.    History   Chief Complaint Chief Complaint  Patient presents with  . Nausea  . Emesis    The history is provided by the patient. No language interpreter was used.   HPI Comments:  Nicole [name] is a 40 y.o. female who presents to the Emergency Department complaining of 2 episodes of vomiting today. Pt also reports a fever of 101 and generalized body aches. Pt complains of associated headache, sore throat, and cough. Pt reports having a seizure 3 months ago which caused her to fall and injur her hip which is currently causing her pain. Pt has taken tylenol and benadryl with no relief.   Past Medical History:  Diagnosis Date  . Anemia   . Asthma   . Blood transfusion 2011   r/t anemia  . Fibroid   . Migraine   . Migraine   . Seizures [facility])    last sz Jan 2012  . Seizures ([place])   . Sickle cell trait ([place])   . Sickle cell trait ([place])   . Tooth abscess     Patient Active Problem List   Diagnosis Date Noted  . 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 Depression   . Generalized abdominal pain   . Fatigue 10/28/2015  . Adjustment disorder with depressed mood 10/21/2015  . Dizziness   . Weakness 11/18/2014  . OSA (obstructive sleep apnea) 10/21/2014  . Acute rhinosinusitis 10/09/2014  . Leg pain, lateral 09/04/2014  . Migraines 08/13/2014  . Seizure ([place]) 06/04/2014  . Leiomyoma of uterus, unspecified 02/11/2014  . GERD (gastroesophageal reflux disease) 01/28/2014  . Candidiasis of vulva and vagina 01/28/2014  . Unspecified symptom associated with female genital organs 01/28/2014  . Chest pain 01/20/2014  . Acute pericarditis, unspecified 01/20/2014    Past Surgical  History:  Procedure Laterality Date  . TUBAL LIGATION Bilateral 2005    OB History    Gravida Para Term Preterm AB Living   3 2 1 1 1 2    SAB TAB Ectopic Multiple Live Births   0 0 1 0 2       Home Medications    Prior to Admission medications   Medication Sig Start Date End Date Taking? Authorizing Provider  acetaminophen (TYLENOL) 500 MG tablet Take 1 tablet (500 mg total) by mouth every 6 (six) hours as needed. Patient taking differently: Take 500 mg by mouth every 6 (six) hours as needed for moderate pain or headache.  09/27/15   Gloriann Loan, PA-C  albuterol (PROVENTIL HFA;VENTOLIN HFA) 108 (90 BASE) MCG/ACT inhaler Inhale 2 puffs into the lungs every 4 (four) hours as needed for wheezing or shortness of breath (or coughing). 03/11/15   Shelly Bombard, MD  cyclobenzaprine (FLEXERIL) 10 MG tablet Take 1 tablet (10 mg total) by mouth 3 (three) times daily as needed for muscle spasms. 07/20/16   Roxanna Mew, PA-C  FLUoxetine (PROZAC) 20 MG capsule Take 1 capsule (20 mg total) by mouth daily. 10/28/15   Veatrice Bourbon, MD  gabapentin (NEURONTIN) 300 MG capsule Take 1 capsule (300 mg total) by mouth 3 (three) times daily. Patient taking differently: Take 300-1,200 mg by mouth 3 (three) times daily. Takes one capsule  twice daily and then takes four capsules at bedtime. 01/25/15   Tanna Furry, MD  levETIRAcetam (KEPPRA) 1000 MG tablet TAKE 1 TABLET BY MOUTH TWICE A DAY 03/07/16   Leone Brand, MD  medroxyPROGESTERone (DEPO-PROVERA) 150 MG/ML injection Inject 1 mL (150 mg total) into the muscle every 3 (three) months. Inject on  the 22nd of each month 03/30/16   Shelly Bombard, MD  oxyCODONE-acetaminophen (PERCOCET/ROXICET) 5-325 MG tablet Take 1 tablet by mouth every 6 (six) hours as needed for severe pain. 07/20/16   Roxanna Mew, PA-C  topiramate (TOPAMAX) 25 MG tablet TAKE 3 TABLETS BY MOUTH DAILY Patient taking differently: TAKE 75mg  BY MOUTH once DAILY 03/07/16   Leone Brand, MD    Family History Family History  Problem Relation Age of Onset  . Hypertension Mother   . Diabetes Mother   . Cancer Mother     Colon, brain, 2 other  . Asthma Mother   . Clotting disorder Mother   . Heart disease Mother     81, stents  . Stroke Mother     2012  . Diabetes Father   . Asthma Father   . Clotting disorder Father   . Sickle cell anemia Father   . Heart disease Father     2000, stents  . Hypertension Father   . Stroke Father     2007  . Diabetes Maternal Grandmother   . Asthma Sister   . Asthma Brother   . Asthma Paternal Grandmother   . Anesthesia problems Neg Hx     Social History Social History  Substance Use Topics  . Smoking status: Former Smoker    Quit date: 03/27/2014  . Smokeless tobacco: Never Used  . Alcohol use No     Allergies   Shrimp [shellfish allergy]; Tea; Penicillins; and Sulfa antibiotics   Review of Systems Review of Systems  Constitutional: Positive for fatigue and fever.  HENT: Positive for sore throat.   Gastrointestinal: Positive for nausea and vomiting.  Musculoskeletal: Positive for myalgias.  Neurological: Positive for headaches.     Physical Exam Updated Vital Signs BP 123/76 (BP Location: Left Arm)   Pulse 96   Temp 98.1 F (36.7 C) (Oral)   Resp 20   SpO2 99%   Physical Exam  Constitutional: She appears well-developed and well-nourished. No distress.  HENT:  Head: Normocephalic and atraumatic.  TMs clear, oropharynx benign, no erythema, tonsillar swelling or exudates. Uvular midline. No cervical adenopathy.   Eyes: Conjunctivae are normal.  Cardiovascular: Normal rate and regular rhythm.   Pulmonary/Chest: Effort normal and breath sounds normal.  Abdominal: Soft. She exhibits no distension. There is no tenderness.  Musculoskeletal: She exhibits no edema.  Right hip without swelling or deformity. Joint tender. FROM.  Neurological: She is alert.  Skin: Skin is warm and dry.  Psychiatric:  She has a normal mood and affect.  Nursing note and vitals reviewed.    ED Treatments / Results  DIAGNOSTIC STUDIES:  Oxygen Saturation is 99% on RA, normal by my interpretation.    COORDINATION OF CARE:  1:09 AM Discussed treatment plan with pt at bedside and pt agreed to plan.   Labs (all labs ordered are listed, but only abnormal results are displayed) Labs Reviewed  URINALYSIS, ROUTINE W REFLEX MICROSCOPIC (NOT AT [facility]) - Abnormal; Notable for the following:       Result Value   Specific Gravity, Urine 1.004 (*)    All other components within  normal limits  LIPASE, BLOOD  COMPREHENSIVE METABOLIC PANEL  CBC    EKG  EKG Interpretation None       Radiology No results found.  Procedures Procedures (including critical care time)  Medications Ordered in ED Medications  ondansetron (ZOFRAN) injection 4 mg (not administered)     Initial Impression / Assessment and Plan / ED Course  I have reviewed the triage vital signs and the nursing notes.  Pertinent labs & imaging results that were available during my care of the patient were reviewed by me and considered in my medical decision making (see chart for details).  Clinical Course    Patient with symptoms of nausea, vomiting, generalized body aches. No fever. Also complains of right hip pain x 3 months that is being followed by her PCP. VSS, afebrile here. She is well appearing, hydrated. Suspect flu like illness requiring supportive care.   Final Clinical Impressions(s) / ED Diagnoses   Final diagnoses:  None  1. Flu like symptoms 2. Myalgia 3. Right hip pain  New Prescriptions New Prescriptions   No medications on file  I personally performed the services described in this documentation, which was scribed in my presence. The recorded information has been reviewed and is accurate.       Charlann Lange, PA-C 09/13/16 0600    Ripley Fraise, MD 09/13/16 [phone]

## 2016-09-19 ENCOUNTER — Ambulatory Visit: Payer: Self-pay | Admitting: Obstetrics

## 2016-09-21 ENCOUNTER — Encounter (HOSPITAL_COMMUNITY): Payer: Self-pay | Admitting: Emergency Medicine

## 2016-09-21 ENCOUNTER — Emergency Department (HOSPITAL_COMMUNITY)
Admission: EM | Admit: 2016-09-21 | Discharge: 2016-09-21 | Disposition: A | Discharge status: Home / Self Care | Payer: 59 | Attending: Emergency Medicine | Admitting: Emergency Medicine

## 2016-09-21 DIAGNOSIS — Z87891 Personal history of nicotine dependence: Secondary | ICD-10-CM | POA: Diagnosis not present

## 2016-09-21 DIAGNOSIS — R569 Unspecified convulsions: Secondary | ICD-10-CM | POA: Diagnosis present

## 2016-09-21 DIAGNOSIS — Z79899 Other long term (current) drug therapy: Secondary | ICD-10-CM | POA: Diagnosis not present

## 2016-09-21 DIAGNOSIS — J45909 Unspecified asthma, uncomplicated: Secondary | ICD-10-CM | POA: Insufficient documentation

## 2016-09-21 LAB — BASIC METABOLIC PANEL
ANION GAP: 8 (ref 5–15)
BUN: 7 mg/dL (ref 6–20)
CALCIUM: 8.9 mg/dL (ref 8.9–10.3)
CO2: 22 mmol/L (ref 22–32)
Chloride: 110 mmol/L (ref 101–111)
Creatinine, Ser: 0.76 mg/dL (ref 0.44–1.00)
GFR calc Af Amer: >60 mL/min (ref >60)
GLUCOSE: 73 mg/dL (ref 65–99)
POTASSIUM: 3.8 mmol/L (ref 3.5–5.1)
SODIUM: 140 mmol/L (ref 135–145)

## 2016-09-21 LAB — CBC
HEMATOCRIT: 35.5 % — AB (ref 36.0–46.0)
HEMOGLOBIN: 11.9 g/dL — AB (ref 12.0–15.0)
MCH: 28.5 pg (ref 26.0–34.0)
MCHC: 33.5 g/dL (ref 30.0–36.0)
MCV: 85.1 fL (ref 78.0–100.0)
Platelets: 270 10*3/uL (ref 150–400)
RBC: 4.17 MIL/uL (ref 3.87–5.11)
RDW: 12.9 % (ref 11.5–15.5)
WBC: 6.2 10*3/uL (ref 4.0–10.5)

## 2016-09-21 LAB — I-STAT BETA HCG BLOOD, ED (MC, WL, AP ONLY): I-stat hCG, quantitative: <5 m[IU]/mL

## 2016-09-21 MED ORDER — DIPHENHYDRAMINE HCL 25 MG PO CAPS: 25.0000 mg | ORAL_CAPSULE | Freq: Once | ORAL | Status: DC

## 2016-09-21 MED ORDER — ACETAMINOPHEN 325 MG PO TABS
650.0000 mg | ORAL_TABLET | Freq: Once | ORAL | Status: AC
Start: 2016-09-21 — End: 2016-09-21
  Administered 2016-09-21: 650 mg via ORAL
  Filled 2016-09-21: qty 2

## 2016-09-21 MED ORDER — KETOROLAC TROMETHAMINE 15 MG/ML IJ SOLN: 15.0000 mg | Freq: Once | INTRAMUSCULAR | Status: DC

## 2016-09-21 MED ORDER — METOCLOPRAMIDE HCL 5 MG/ML IJ SOLN: 10.0000 mg | Freq: Once | INTRAMUSCULAR | Status: DC

## 2016-09-21 MED ORDER — KETOROLAC TROMETHAMINE 60 MG/2ML IM SOLN
60.0000 mg | Freq: Once | INTRAMUSCULAR | Status: AC
  Administered 2016-09-21: 60 mg via INTRAMUSCULAR
  Filled 2016-09-21: qty 2

## 2016-09-21 NOTE — ED Notes (Signed)
Pt provided with d/c instructions at this time. Pt verbalizes understanding of d/c instructions as well as follow up procedure with her Neurologist.  No new RX at time of d/c.  Pt in no apparent distress at this time. Pt ambulatory at time of d/c.

## 2016-09-21 NOTE — ED Provider Notes (Signed)
[place] DEPT Provider Note   CSN: [id number] Arrival date & time: 09/21/16  1440     History   Chief Complaint Chief Complaint  Patient presents with  . Seizures    HPI Nicole [name] is a 40 y.o. female.  HPI  Nicole [name] is a 40 y.o. female with PMH significant for seizure, migraine, sickle cell trait who presents with witnessed seizure last night.  No head injury.  Currently on keppra and topamax.  States she has missed multiple doses recently.  She now complains of a global headache that is consistent with her previous migraines.  No neck pain, visual disturbances, CP, SOB, numbness, weakness, gait abnormalities, N/V.  She took her Topamax, but not other medications.  No fevers or chills. She also complains of right hip pain, but this is a chronic problem.  Ambulatory. LMP unknown, on depo.   Past Medical History:  Diagnosis Date  . Anemia   . Asthma   . Blood transfusion 2011   r/t anemia  . Fibroid   . Migraine   . Migraine   . Seizures [facility])    last sz Jan 2012  . Seizures ([place])   . Sickle cell trait ([place])   . Sickle cell trait ([place])   . Tooth abscess     Patient Active Problem List   Diagnosis Date Noted  . 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 Depression   . Generalized abdominal pain   . Fatigue 10/28/2015  . Adjustment disorder with depressed mood 10/21/2015  . Dizziness   . Weakness 11/18/2014  . OSA (obstructive sleep apnea) 10/21/2014  . Acute rhinosinusitis 10/09/2014  . Leg pain, lateral 09/04/2014  . Migraines 08/13/2014  . Seizure ([place]) 06/04/2014  . Leiomyoma of uterus, unspecified 02/11/2014  . GERD (gastroesophageal reflux disease) 01/28/2014  . Candidiasis of vulva and vagina 01/28/2014  . Unspecified symptom associated with female genital organs 01/28/2014  . Chest pain 01/20/2014  . Acute pericarditis, unspecified 01/20/2014    Past Surgical History:  Procedure Laterality Date  . TUBAL LIGATION Bilateral 2005    OB History    Gravida Para Term Preterm AB Living   3 2 1 1 1 2    SAB TAB Ectopic Multiple Live Births   0 0 1 0 2       Home Medications    Prior to Admission medications   Medication Sig Start Date End Date Taking? Authorizing Provider  acetaminophen (TYLENOL) 500 MG tablet Take 1 tablet (500 mg total) by mouth every 6 (six) hours as needed. Patient taking differently: Take 500 mg by mouth every 6 (six) hours as needed for moderate pain or headache.  09/27/15   Gloriann Loan, PA-C  albuterol (PROVENTIL HFA;VENTOLIN HFA) 108 (90 BASE) MCG/ACT inhaler Inhale 2 puffs into the lungs every 4 (four) hours as needed for wheezing or shortness of breath (or coughing). 03/11/15   Shelly Bombard, MD  cyclobenzaprine (FLEXERIL) 10 MG tablet Take 1 tablet (10 mg total) by mouth 3 (three) times daily as needed for muscle spasms. 07/20/16   Roxanna Mew, PA-C  FLUoxetine (PROZAC) 20 MG capsule Take 1 capsule (20 mg total) by mouth daily. 10/28/15   Veatrice Bourbon, MD  gabapentin (NEURONTIN) 300 MG capsule Take 1 capsule (300 mg total) by mouth 3 (three) times daily. Patient taking differently: Take 300-1,200 mg by mouth 3 (three) times daily. Takes one capsule twice daily and then takes four capsules at bedtime. 01/25/15   Tanna Furry,  MD  levETIRAcetam (KEPPRA) 1000 MG tablet TAKE 1 TABLET BY MOUTH TWICE A DAY 03/07/16   Leone Brand, MD  medroxyPROGESTERone (DEPO-PROVERA) 150 MG/ML injection Inject 1 mL (150 mg total) into the muscle every 3 (three) months. Inject on  the 22nd of each month 03/30/16   Shelly Bombard, MD  oxyCODONE-acetaminophen (PERCOCET/ROXICET) 5-325 MG tablet Take 1 tablet by mouth every 6 (six) hours as needed for severe pain. 07/20/16   Roxanna Mew, PA-C  topiramate (TOPAMAX) 25 MG tablet TAKE 3 TABLETS BY MOUTH DAILY Patient taking differently: TAKE 75mg  BY MOUTH once DAILY 03/07/16   Leone Brand, MD    Family History Family History  Problem Relation Age of Onset  .  Hypertension Mother   . Diabetes Mother   . Cancer Mother     Colon, brain, 2 other  . Asthma Mother   . Clotting disorder Mother   . Heart disease Mother     59, stents  . Stroke Mother     2012  . Diabetes Father   . Asthma Father   . Clotting disorder Father   . Sickle cell anemia Father   . Heart disease Father     2000, stents  . Hypertension Father   . Stroke Father     2007  . Diabetes Maternal Grandmother   . Asthma Sister   . Asthma Brother   . Asthma Paternal Grandmother   . Anesthesia problems Neg Hx     Social History Social History  Substance Use Topics  . Smoking status: Former Smoker    Quit date: 03/27/2014  . Smokeless tobacco: Never Used  . Alcohol use No     Allergies   Shrimp [shellfish allergy]; Tea; Penicillins; and Sulfa antibiotics   Review of Systems Review of Systems All other systems negative unless otherwise stated in HPI   Physical Exam Updated Vital Signs BP 124/96   Pulse 75   Temp 98.1 F (36.7 C) (Oral)   Resp 18   Ht 5\' 6"  (1.676 m)   Wt 102.1 kg   SpO2 100%   BMI 36.32 kg/m   Physical Exam  Constitutional: She is oriented to person, place, and time. She appears well-developed and well-nourished.  Non-toxic appearance. She does not have a sickly appearance. She does not appear ill.  HENT:  Head: Normocephalic and atraumatic.  Mouth/Throat: Oropharynx is clear and moist.  Eyes: Conjunctivae are normal. Pupils are equal, round, and reactive to light.  Neck: Normal range of motion. Neck supple.  No nuchal rigidity.   Cardiovascular: Normal rate and regular rhythm.   Pulmonary/Chest: Effort normal and breath sounds normal. No accessory muscle usage or stridor. No respiratory distress. She has no wheezes. She has no rhonchi. She has no rales.  Abdominal: Soft. Bowel sounds are normal. She exhibits no distension. There is no tenderness.  Musculoskeletal: Normal range of motion.  Lymphadenopathy:    She has no cervical  adenopathy.  Neurological: She is alert and oriented to person, place, and time.  Mental Status:   AOx3.  Speech clear without dysarthria. Cranial Nerves:  I-not tested  II-PERRLA  III, IV, VI-EOMs intact  V-temporal and masseter strength intact  VII-symmetrical facial movements intact, no facial droop  VIII-hearing grossly intact bilaterally  IX, X-gag intact  XI-strength of sternomastoid and trapezius muscles 5/5  XII-tongue midline Motor:   Good muscle bulk and tone  Strength 5/5 bilaterally in upper and lower extremities   Cerebellar--intact RAMs,  finger to nose intact bilaterally.    No pronator drift Sensory:  Intact in upper and lower extremities   Skin: Skin is warm and dry.  Psychiatric: She has a normal mood and affect. Her behavior is normal.     ED Treatments / Results  Labs (all labs ordered are listed, but only abnormal results are displayed) Labs Reviewed  CBC - Abnormal; Notable for the following:       Result Value   Hemoglobin 11.9 (*)    HCT 35.5 (*)    All other components within normal limits  BASIC METABOLIC PANEL  LEVETIRACETAM LEVEL  I-STAT BETA HCG BLOOD, ED ([facility], [facility], [facility] ONLY)    EKG  EKG Interpretation None       Radiology No results found.  Procedures Procedures (including critical care time)  Medications Ordered in ED Medications  ketorolac (TORADOL) injection 60 mg (60 mg Intramuscular Given 09/21/16 1859)  acetaminophen (TYLENOL) tablet 650 mg (650 mg Oral Given 09/21/16 2035)     Initial Impression / Assessment and Plan / ED Course  I have reviewed the triage vital signs and the nursing notes.  Pertinent labs & imaging results that were available during my care of the patient were reviewed by me and considered in my medical decision making (see chart for details).  Clinical Course   Patient with known history of seizure presents with seizure and headache c/w prior migraines.  Normal neuro exam.  No fever.  No indication  for imaging.  Doubt NPH, meningitis, intracranial mass.  No metabolic derangements.  She's not pregnant.  Feels improved after Toradol and Tylenol.  Follow up with neurologist.  Return precautions discussed.  Stable for discharge.  Final Clinical Impressions(s) / ED Diagnoses   Final diagnoses:  Seizure [facility])    New Prescriptions New Prescriptions   No medications on file     Gloriann Loan, Hershal Coria 09/21/16 2039    Gloriann Loan, PA-C 09/21/16 2040    Jola Schmidt, MD 09/21/16 2229

## 2016-09-21 NOTE — ED Triage Notes (Signed)
Pt states she had a witnessed seizure last night by her husband. Pt has history of and is on [place] but states lately, "has been taking off and on because they make her sleepy." Pt is alert and ox4. Pt states she is having right hip pain from her seizure she thinks she was laying on right side. No oral or head trauma noted.

## 2016-09-23 LAB — LEVETIRACETAM LEVEL: LEVETIRACETAM: 15.8 ug/mL (ref 10.0–40.0)

## 2016-09-29 ENCOUNTER — Encounter: Payer: Self-pay | Admitting: Obstetrics

## 2016-09-29 ENCOUNTER — Ambulatory Visit (INDEPENDENT_AMBULATORY_CARE_PROVIDER_SITE_OTHER): Payer: 59 | Admitting: Obstetrics

## 2016-09-29 VITALS — BP 144/95 | HR 76 | Temp 99.0°F | Wt 244.5 lb

## 2016-09-29 DIAGNOSIS — N760 Acute vaginitis: Secondary | ICD-10-CM | POA: Diagnosis not present

## 2016-09-29 DIAGNOSIS — R102 Pelvic and perineal pain: Secondary | ICD-10-CM | POA: Diagnosis not present

## 2016-09-29 DIAGNOSIS — Z124 Encounter for screening for malignant neoplasm of cervix: Secondary | ICD-10-CM

## 2016-09-29 DIAGNOSIS — B9689 Other specified bacterial agents as the cause of diseases classified elsewhere: Secondary | ICD-10-CM

## 2016-09-29 DIAGNOSIS — Z3042 Encounter for surveillance of injectable contraceptive: Secondary | ICD-10-CM

## 2016-09-29 DIAGNOSIS — Z113 Encounter for screening for infections with a predominantly sexual mode of transmission: Secondary | ICD-10-CM | POA: Diagnosis not present

## 2016-09-29 DIAGNOSIS — Z1151 Encounter for screening for human papillomavirus (HPV): Secondary | ICD-10-CM

## 2016-09-29 DIAGNOSIS — Z01419 Encounter for gynecological examination (general) (routine) without abnormal findings: Secondary | ICD-10-CM | POA: Diagnosis not present

## 2016-09-29 MED ORDER — OXYCODONE-ACETAMINOPHEN 10-325 MG PO TABS: 1.0000 | ORAL_TABLET | ORAL | 0 refills | Status: DC | PRN

## 2016-09-29 MED ORDER — MEDROXYPROGESTERONE ACETATE 150 MG/ML IM SUSP
150.0000 mg | Freq: Once | INTRAMUSCULAR | Status: AC
  Administered 2016-09-29: 150 mg via INTRAMUSCULAR

## 2016-09-29 NOTE — Patient Instructions (Addendum)

## 2016-09-29 NOTE — Progress Notes (Signed)
Patient in office today for AEX and Depo injection. Pt tolerated well, given in left deltoid. RTO between [place]. 5-19th for routine injection.

## 2016-09-29 NOTE — Progress Notes (Signed)
Subjective:        Nicole [name] is a 40 y.o. female here for a routine exam.  Current complaints: Recent back injury.  Had MRI done which showed small uterine fibroid and was suggestive of adenomyosis.  On Depo Provera for contraception and is pleased with no periods.  Personal health questionnaire:  Is patient [name], have a family history of breast and/or ovarian cancer: no Is there a family history of uterine cancer diagnosed at age < 24, gastrointestinal cancer, urinary tract cancer, family member who is a Field seismologist syndrome-associated carrier: no Is the patient overweight and hypertensive, family history of diabetes, personal history of gestational diabetes, preeclampsia or PCOS: no Is patient over 65, have PCOS,  family history of premature CHD under age 73, diabetes, smoke, have hypertension or peripheral artery disease:  no At any time, has a partner hit, kicked or otherwise hurt or frightened you?: no Over the past 2 weeks, have you felt down, depressed or hopeless?: no Over the past 2 weeks, have you felt little interest or pleasure in doing things?:no   Gynecologic History No LMP recorded. Patient has had an injection. Contraception: Depo-Provera injections Last Pap: unknown. Results were: normal Last mammogram: n/a. Results were: n/a  Obstetric History OB History  Gravida Para Term Preterm AB Living  3 2 1 1 1 2   SAB TAB Ectopic Multiple Live Births  0 0 1 0 2    # Outcome Date GA Lbr Len/2nd Weight Sex Delivery Anes PTL Lv  3 Preterm 12/26/03    M Vag-Spont   LIV     Birth Comments: 64 wks, IOL for fetal distress  2 Term 05/17/95    F Vag-Spont  Y LIV  1 Ectopic               Past Medical History:  Diagnosis Date  . Anemia   . Asthma   . Blood transfusion 2011   r/t anemia  . Fibroid   . Migraine   . Migraine   . Seizures [facility])    last sz Jan 2012  . Seizures ([place])   . Sickle cell trait ([place])   . Sickle cell trait ([place])   . Tooth abscess     Past Surgical History:  Procedure Laterality Date  . TUBAL LIGATION Bilateral 2005     Current Outpatient Prescriptions:  .  acetaminophen (TYLENOL) 500 MG tablet, Take 1 tablet (500 mg total) by mouth every 6 (six) hours as needed. (Patient taking differently: Take 500 mg by mouth every 6 (six) hours as needed for moderate pain or headache. ), Disp: 14 tablet, Rfl: 0 .  albuterol (PROVENTIL HFA;VENTOLIN HFA) 108 (90 BASE) MCG/ACT inhaler, Inhale 2 puffs into the lungs every 4 (four) hours as needed for wheezing or shortness of breath (or coughing)., Disp: 1 Inhaler, Rfl: prn .  cyclobenzaprine (FLEXERIL) 10 MG tablet, Take 1 tablet (10 mg total) by mouth 3 (three) times daily as needed for muscle spasms., Disp: 20 tablet, Rfl: 0 .  FLUoxetine (PROZAC) 20 MG capsule, Take 1 capsule (20 mg total) by mouth daily., Disp: 30 capsule, Rfl: 3 .  gabapentin (NEURONTIN) 300 MG capsule, Take 1 capsule (300 mg total) by mouth 3 (three) times daily. (Patient taking differently: Take 300-1,200 mg by mouth 3 (three) times daily. Takes one capsule twice daily and then takes four capsules at bedtime.), Disp: 90 capsule, Rfl: 0 .  levETIRAcetam (KEPPRA) 1000 MG tablet, TAKE 1 TABLET BY MOUTH TWICE  A DAY, Disp: 60 tablet, Rfl: 3 .  medroxyPROGESTERone (DEPO-PROVERA) 150 MG/ML injection, Inject 1 mL (150 mg total) into the muscle every 3 (three) months. Inject on  the 22nd of each month, Disp: 1 mL, Rfl: 3 .  oxyCODONE-acetaminophen (PERCOCET/ROXICET) 5-325 MG tablet, Take 1 tablet by mouth every 6 (six) hours as needed for severe pain., Disp: 12 tablet, Rfl: 0 .  topiramate (TOPAMAX) 25 MG tablet, TAKE 3 TABLETS BY MOUTH DAILY (Patient taking differently: TAKE 75mg  BY MOUTH once DAILY), Disp: 90 tablet, Rfl: 3 .  oxyCODONE-acetaminophen (PERCOCET) 10-325 MG tablet, Take 1 tablet by mouth every 4 (four) hours as needed for pain., Disp: 30 tablet, Rfl: 0  Current Facility-Administered Medications:  .   medroxyPROGESTERone (DEPO-PROVERA) injection 150 mg, 150 mg, Intramuscular, Q90 days, Shelly Bombard, MD, 150 mg at 06/27/16 1008 Allergies  Allergen Reactions  . Shrimp [Shellfish Allergy] Anaphylaxis  . Tea Anaphylaxis    All teas  . Penicillins Swelling    Has patient had a PCN reaction causing immediate rash, facial/tongue/throat swelling, SOB or lightheadedness with hypotension: Yes Has patient had a PCN reaction causing severe rash involving mucus membranes or skin necrosis: Yes Has patient had a PCN reaction that required hospitalization Yes- ed visit Has patient had a PCN reaction occurring within the last 10 years: No-childhood allergy If all of the above answers are "NO", then may proceed with Cephalosporin use.   . Sulfa Antibiotics Rash    Social History  Substance Use Topics  . Smoking status: Former Smoker    Quit date: 03/27/2014  . Smokeless tobacco: Never Used  . Alcohol use No    Family History  Problem Relation Age of Onset  . Hypertension Mother   . Diabetes Mother   . Cancer Mother     Colon, brain, 2 other  . Asthma Mother   . Clotting disorder Mother   . Heart disease Mother     65, stents  . Stroke Mother     2012  . Diabetes Father   . Asthma Father   . Clotting disorder Father   . Sickle cell anemia Father   . Heart disease Father     2000, stents  . Hypertension Father   . Stroke Father     2007  . Diabetes Maternal Grandmother   . Asthma Sister   . Asthma Brother   . Asthma Paternal Grandmother   . Anesthesia problems Neg Hx       Review of Systems  Constitutional: negative for fatigue and weight loss Respiratory: negative for cough and wheezing Cardiovascular: negative for chest pain, fatigue and palpitations Gastrointestinal: negative for abdominal pain and change in bowel habits Musculoskeletal:positive for myalgias Neurological: negative for gait problems and tremors Behavioral/Psych: negative for abusive relationship,  depression Endocrine: negative for temperature intolerance   Genitourinary:negative for abnormal menstrual periods, genital lesions, hot flashes, sexual problems and vaginal discharge Integument/breast: negative for breast lump, breast tenderness, nipple discharge and skin lesion(s)    Objective:       BP (!) 144/95   Pulse 76   Temp 99 F (37.2 C) (Oral)   Wt 244 lb 8 oz (110.9 kg)   BMI 39.46 kg/m  General:   alert  Skin:   no rash or abnormalities  Lungs:   clear to auscultation bilaterally  Heart:   regular rate and rhythm, S1, S2 normal, no murmur, click, rub or gallop  Breasts:   normal without suspicious masses, skin or  nipple changes or axillary nodes  Abdomen:  normal findings: no organomegaly, soft, non-tender and no hernia  Pelvis:  External genitalia: normal general appearance Urinary system: urethral meatus normal and bladder without fullness, nontender Vaginal: normal without tenderness, induration or masses Cervix: normal appearance Adnexa: normal bimanual exam Uterus: anteverted and non-tender, normal size   Lab Review Urine pregnancy test Labs reviewed yes Radiologic studies reviewed yes  50% of 20 min visit spent on counseling and coordination of care.   Assessment:    Healthy female exam.    Backache, related to contusion from seizure and fall  Pelvic pain, may be related to adenomyosis.     Plan:    Will follow pain clinically.  Oxycodone Rx.  Education reviewed: depression evaluation, low fat, low cholesterol diet, safe sex/STD prevention, self breast exams and weight bearing exercise. Contraception: Depo-Provera injections. Follow up in: 1 year.   Meds ordered this encounter  Medications  . medroxyPROGESTERone (DEPO-PROVERA) injection 150 mg  . oxyCODONE-acetaminophen (PERCOCET) 10-325 MG tablet    Sig: Take 1 tablet by mouth every 4 (four) hours as needed for pain.    Dispense:  30 tablet    Refill:  0   No orders of the defined types  were placed in this encounter.

## 2016-10-02 LAB — CERVICOVAGINAL ANCILLARY ONLY
Chlamydia: NEGATIVE
Neisseria Gonorrhea: NEGATIVE
TRICH (WINDOWPATH): NEGATIVE

## 2016-10-04 ENCOUNTER — Other Ambulatory Visit: Payer: Self-pay | Admitting: Obstetrics

## 2016-10-04 DIAGNOSIS — B373 Candidiasis of vulva and vagina: Secondary | ICD-10-CM

## 2016-10-04 DIAGNOSIS — N760 Acute vaginitis: Principal | ICD-10-CM

## 2016-10-04 DIAGNOSIS — B9689 Other specified bacterial agents as the cause of diseases classified elsewhere: Secondary | ICD-10-CM

## 2016-10-04 DIAGNOSIS — B3731 Acute candidiasis of vulva and vagina: Secondary | ICD-10-CM

## 2016-10-04 LAB — CYTOLOGY - PAP
Diagnosis: NEGATIVE
HPV (WINDOPATH): NOT DETECTED

## 2016-10-04 LAB — CERVICOVAGINAL ANCILLARY ONLY
Bacterial vaginitis: POSITIVE — AB
CANDIDA VAGINITIS: POSITIVE — AB

## 2016-10-04 MED ORDER — FLUCONAZOLE 150 MG PO TABS: 150.0000 mg | ORAL_TABLET | Freq: Once | ORAL | 2 refills | Status: AC

## 2016-10-04 MED ORDER — METRONIDAZOLE 500 MG PO TABS: 500.0000 mg | ORAL_TABLET | Freq: Two times a day (BID) | ORAL | 2 refills | Status: DC

## 2016-10-15 ENCOUNTER — Emergency Department (HOSPITAL_COMMUNITY)
Admission: EM | Admit: 2016-10-15 | Discharge: 2016-10-15 | Disposition: A | Discharge status: Home / Self Care | Payer: 59 | Attending: Physician Assistant | Admitting: Physician Assistant

## 2016-10-15 ENCOUNTER — Encounter (HOSPITAL_COMMUNITY): Payer: Self-pay | Admitting: Emergency Medicine

## 2016-10-15 DIAGNOSIS — J45909 Unspecified asthma, uncomplicated: Secondary | ICD-10-CM | POA: Diagnosis not present

## 2016-10-15 DIAGNOSIS — Z87891 Personal history of nicotine dependence: Secondary | ICD-10-CM | POA: Diagnosis not present

## 2016-10-15 DIAGNOSIS — G40909 Epilepsy, unspecified, not intractable, without status epilepticus: Secondary | ICD-10-CM | POA: Diagnosis not present

## 2016-10-15 DIAGNOSIS — R569 Unspecified convulsions: Secondary | ICD-10-CM

## 2016-10-15 DIAGNOSIS — Z79899 Other long term (current) drug therapy: Secondary | ICD-10-CM | POA: Insufficient documentation

## 2016-10-15 LAB — CBC WITH DIFFERENTIAL/PLATELET
Basophils Absolute: 0 10*3/uL (ref 0.0–0.1)
Basophils Relative: 1 %
Eosinophils Absolute: 0.3 10*3/uL (ref 0.0–0.7)
Eosinophils Relative: 7 %
HEMATOCRIT: 35.4 % — AB (ref 36.0–46.0)
HEMOGLOBIN: 12.2 g/dL (ref 12.0–15.0)
LYMPHS ABS: 2.2 10*3/uL (ref 0.7–4.0)
LYMPHS PCT: 49 %
MCH: 29 pg (ref 26.0–34.0)
MCHC: 34.5 g/dL (ref 30.0–36.0)
MCV: 84.3 fL (ref 78.0–100.0)
MONOS PCT: 6 %
Monocytes Absolute: 0.3 10*3/uL (ref 0.1–1.0)
NEUTROS ABS: 1.7 10*3/uL (ref 1.7–7.7)
NEUTROS PCT: 37 %
Platelets: 281 10*3/uL (ref 150–400)
RBC: 4.2 MIL/uL (ref 3.87–5.11)
RDW: 13.6 % (ref 11.5–15.5)
WBC: 4.5 10*3/uL (ref 4.0–10.5)

## 2016-10-15 LAB — COMPREHENSIVE METABOLIC PANEL
ALBUMIN: 4 g/dL (ref 3.5–5.0)
ALK PHOS: 65 U/L (ref 38–126)
ALT: 13 U/L — ABNORMAL LOW (ref 14–54)
ANION GAP: 3 — AB (ref 5–15)
AST: 19 U/L (ref 15–41)
BILIRUBIN TOTAL: 0.5 mg/dL (ref 0.3–1.2)
BUN: 11 mg/dL (ref 6–20)
CALCIUM: 8.7 mg/dL — AB (ref 8.9–10.3)
CO2: 18 mmol/L — ABNORMAL LOW (ref 22–32)
Chloride: 119 mmol/L — ABNORMAL HIGH (ref 101–111)
Creatinine, Ser: 0.95 mg/dL (ref 0.44–1.00)
GLUCOSE: 96 mg/dL (ref 65–99)
Potassium: 3.6 mmol/L (ref 3.5–5.1)
Sodium: 140 mmol/L (ref 135–145)
TOTAL PROTEIN: 7.3 g/dL (ref 6.5–8.1)

## 2016-10-15 LAB — POC URINE PREG, ED: Preg Test, Ur: NEGATIVE

## 2016-10-15 LAB — CBG MONITORING, ED: Glucose-Capillary: 111 mg/dL — ABNORMAL HIGH (ref 65–99)

## 2016-10-15 MED ORDER — SODIUM CHLORIDE 0.9 % IV BOLUS (SEPSIS)
1000.0000 mL | Freq: Once | INTRAVENOUS | Status: AC
  Administered 2016-10-15: 1000 mL via INTRAVENOUS

## 2016-10-15 MED ORDER — SODIUM CHLORIDE 0.9 % IV SOLN
1000.0000 mg | Freq: Once | INTRAVENOUS | Status: AC
  Administered 2016-10-15: 1000 mg via INTRAVENOUS
  Filled 2016-10-15: qty 10

## 2016-10-15 NOTE — Discharge Instructions (Signed)
You were seen today for seizure. Please continue to take Keppra and Topamax.  It may have been stress induced.  Please return as needed for concerns.

## 2016-10-15 NOTE — ED Notes (Signed)
Bed: [id number] Expected date:  Expected time:  Means of arrival:  Comments: 40 yo seizures x3 w/ hx of the same

## 2016-10-15 NOTE — ED Triage Notes (Signed)
Pt from home via EMS -Per EMS, pt had 3 witnessed seizures this am. Pt was post-ictal upon arrival. Pt stopped taking meds last month but has just restarted 2 weeks ago. Pt is now A&O x4

## 2016-10-15 NOTE — ED Notes (Signed)
Family at bedside. Ambulated pt with assistance to hall, pt sleepy, gait improves as she walks more.

## 2016-10-15 NOTE — ED Provider Notes (Signed)
[facility] DEPT Provider Note   CSN: [id number] Arrival date & time: 10/15/16  0908     History   Chief Complaint Chief Complaint  Patient presents with  . Seizures    HPI Nicole [name] is a 40 y.o. female.  HPI   Patient is a 40 year old female presenting with seizure. Patient has known seizure disorder and known noncompliance. Patient says that she thinks she's been taking it  for the last 2 weeks. Patient had a witnessed seizure by husband. Patient had stressful day yesterday including her grandmother's funeral. She is tearful on exam about her loss. Patient's was given Keppra and Topamax. Patient had no other symptoms except sadness was related to her grandmother's passing. No SI.  Past Medical History:  Diagnosis Date  . Anemia   . Asthma   . Blood transfusion 2011   r/t anemia  . Fibroid   . Migraine   . Migraine   . Seizures [facility])    last sz Jan 2012  . Seizures ([place])   . Sickle cell trait ([place])   . Sickle cell trait ([place])   . Tooth abscess     Patient Active Problem List   Diagnosis Date Noted  . 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 Depression   . Generalized abdominal pain   . Fatigue 10/28/2015  . Adjustment disorder with depressed mood 10/21/2015  . Dizziness   . Weakness 11/18/2014  . OSA (obstructive sleep apnea) 10/21/2014  . Acute rhinosinusitis 10/09/2014  . Leg pain, lateral 09/04/2014  . Migraines 08/13/2014  . Seizure ([place]) 06/04/2014  . Leiomyoma of uterus, unspecified 02/11/2014  . GERD (gastroesophageal reflux disease) 01/28/2014  . Candidiasis of vulva and vagina 01/28/2014  . Unspecified symptom associated with female genital organs 01/28/2014  . Chest pain 01/20/2014  . Acute pericarditis, unspecified 01/20/2014    Past Surgical History:  Procedure Laterality Date  . TUBAL LIGATION Bilateral 2005    OB History    Gravida Para Term Preterm AB Living   3 2 1 1 1 2    SAB TAB Ectopic Multiple Live Births   0 0 1 0 2       Home  Medications    Prior to Admission medications   Medication Sig Start Date End Date Taking? Authorizing Provider  albuterol (PROVENTIL HFA;VENTOLIN HFA) 108 (90 BASE) MCG/ACT inhaler Inhale 2 puffs into the lungs every 4 (four) hours as needed for wheezing or shortness of breath (or coughing). 03/11/15  Yes Shelly Bombard, MD  cyclobenzaprine (FLEXERIL) 10 MG tablet Take 1 tablet (10 mg total) by mouth 3 (three) times daily as needed for muscle spasms. 07/20/16  Yes Roxanna Mew, PA-C  FLUoxetine (PROZAC) 20 MG capsule Take 1 capsule (20 mg total) by mouth daily. 10/28/15  Yes Alyssa A Lincoln Brigham, MD  gabapentin (NEURONTIN) 300 MG capsule Take 1 capsule (300 mg total) by mouth 3 (three) times daily. Patient taking differently: Take 300-1,200 mg by mouth 3 (three) times daily. Takes one capsule twice daily and then takes four capsules at bedtime. 01/25/15  Yes Tanna Furry, MD  ibuprofen (ADVIL,MOTRIN) 800 MG tablet TAKE 1 TABLET BY MOUTH EVERY 8 HOURS AS NEEDED FOR MILD TO MODERATE PAIN 10/03/16  Yes Historical Provider, MD  levETIRAcetam (KEPPRA) 1000 MG tablet TAKE 1 TABLET BY MOUTH TWICE A DAY Patient taking differently: TAKE 1000 MG IN THE MORNING AND 2000 MG AT BEDTIME 03/07/16  Yes Leone Brand, MD  medroxyPROGESTERone (DEPO-PROVERA) 150 MG/ML injection Inject 1  mL (150 mg total) into the muscle every 3 (three) months. Inject on  the 22nd of each month 03/30/16  Yes Shelly Bombard, MD  oxyCODONE-acetaminophen (PERCOCET) 10-325 MG tablet Take 1 tablet by mouth every 4 (four) hours as needed for pain. 09/29/16  Yes Shelly Bombard, MD  topiramate (TOPAMAX) 25 MG tablet TAKE 3 TABLETS BY MOUTH DAILY Patient taking differently: TAKE 75mg  BY MOUTH TWICE DAILY 03/07/16  Yes Leone Brand, MD  acetaminophen (TYLENOL) 500 MG tablet Take 1 tablet (500 mg total) by mouth every 6 (six) hours as needed. Patient not taking: Reported on 10/15/2016 09/27/15   Gloriann Loan, PA-C  metroNIDAZOLE (FLAGYL) 500 MG  tablet Take 1 tablet (500 mg total) by mouth 2 (two) times daily. Patient not taking: Reported on 10/15/2016 10/04/16   Shelly Bombard, MD    Family History Family History  Problem Relation Age of Onset  . Hypertension Mother   . Diabetes Mother   . Cancer Mother     Colon, brain, 2 other  . Asthma Mother   . Clotting disorder Mother   . Heart disease Mother     58, stents  . Stroke Mother     2012  . Diabetes Father   . Asthma Father   . Clotting disorder Father   . Sickle cell anemia Father   . Heart disease Father     2000, stents  . Hypertension Father   . Stroke Father     2007  . Diabetes Maternal Grandmother   . Asthma Sister   . Asthma Brother   . Asthma Paternal Grandmother   . Anesthesia problems Neg Hx     Social History Social History  Substance Use Topics  . Smoking status: Former Smoker    Quit date: 03/27/2014  . Smokeless tobacco: Never Used  . Alcohol use No     Allergies   Shrimp [shellfish allergy]; Tea; Penicillins; and Sulfa antibiotics   Review of Systems Review of Systems  Constitutional: Negative for activity change.  Respiratory: Negative for shortness of breath.   Cardiovascular: Negative for chest pain.  Gastrointestinal: Negative for abdominal pain.  Neurological: Positive for seizures.  All other systems reviewed and are negative.    Physical Exam Updated Vital Signs BP 115/87 (BP Location: Right Arm)   Pulse 88   Temp 98.2 F (36.8 C) (Oral)   Resp 16   Ht 5\' 5"  (1.651 m)   Wt 236 lb (107 kg)   SpO2 97%   BMI 39.27 kg/m   Physical Exam  Constitutional: She is oriented to person, place, and time. She appears well-developed and well-nourished.  HENT:  Head: Normocephalic and atraumatic.  Eyes: Right eye exhibits no discharge.  Cardiovascular: Normal rate, regular rhythm and normal heart sounds.   No murmur heard. Pulmonary/Chest: Effort normal and breath sounds normal. She has no wheezes. She has no rales.    Abdominal: Soft. She exhibits no distension. There is no tenderness.  Neurological: She is oriented to person, place, and time.  Skin: Skin is warm and dry. She is not diaphoretic.  Psychiatric: She has a normal mood and affect.  Tearful about grandmother's funeral.  Nursing note and vitals reviewed.    ED Treatments / Results  Labs (all labs ordered are listed, but only abnormal results are displayed) Labs Reviewed  CBC WITH DIFFERENTIAL/PLATELET - Abnormal; Notable for the following:       Result Value   HCT 35.4 (*)  All other components within normal limits  COMPREHENSIVE METABOLIC PANEL - Abnormal; Notable for the following:    Chloride 119 (*)    CO2 18 (*)    Calcium 8.7 (*)    ALT 13 (*)    Anion gap 3 (*)    All other components within normal limits  CBG MONITORING, ED - Abnormal; Notable for the following:    Glucose-Capillary 111 (*)    All other components within normal limits  LEVETIRACETAM LEVEL  POC URINE PREG, ED    EKG  EKG Interpretation None       Radiology No results found.  Procedures Procedures (including critical care time)  Medications Ordered in ED Medications  sodium chloride 0.9 % bolus 1,000 mL (0 mLs Intravenous Stopped 10/15/16 1058)  levETIRAcetam (KEPPRA) 1,000 mg in sodium chloride 0.9 % 100 mL IVPB (0 mg Intravenous Stopped 10/15/16 1057)     Initial Impression / Assessment and Plan / ED Course  I have reviewed the triage vital signs and the nursing notes.  Pertinent labs & imaging results that were available during my care of the patient were reviewed by me and considered in my medical decision making (see chart for details).  Clinical Course     patietn is a 40 year old female with known seizure disorder on Keppra and Topamax. Patient had been noncompliant in the past. She reports she thinks she's been compliant recently however she's had a major stressor with her grandmother's funeral. Patient is back to baseline now.  We will give her fluids, get labs pregnancy test. Plan to likely discharge home after Keppra loading patient.  1:05 PM Patietn feels back to baseline.  Patient is comfortable, ambulatory, and taking PO at time of discharge.  Patient expressed understanding about return precautions.    Final Clinical Impressions(s) / ED Diagnoses   Final diagnoses:  None    New Prescriptions New Prescriptions   No medications on file     Shawnee Higham Julio Alm, MD 10/15/16 1305

## 2016-10-18 LAB — LEVETIRACETAM LEVEL: Levetiracetam Lvl: 31.6 ug/mL (ref 10.0–40.0)

## 2016-11-03 ENCOUNTER — Emergency Department (HOSPITAL_COMMUNITY)
Admission: EM | Admit: 2016-11-03 | Discharge: 2016-11-03 | Disposition: A | Discharge status: Home / Self Care | Payer: 59 | Attending: Emergency Medicine | Admitting: Emergency Medicine

## 2016-11-03 ENCOUNTER — Encounter (HOSPITAL_COMMUNITY): Payer: Self-pay | Admitting: Emergency Medicine

## 2016-11-03 DIAGNOSIS — R569 Unspecified convulsions: Secondary | ICD-10-CM

## 2016-11-03 DIAGNOSIS — Z87891 Personal history of nicotine dependence: Secondary | ICD-10-CM | POA: Insufficient documentation

## 2016-11-03 DIAGNOSIS — J45909 Unspecified asthma, uncomplicated: Secondary | ICD-10-CM | POA: Diagnosis not present

## 2016-11-03 DIAGNOSIS — H6692 Otitis media, unspecified, left ear: Secondary | ICD-10-CM | POA: Insufficient documentation

## 2016-11-03 DIAGNOSIS — G40909 Epilepsy, unspecified, not intractable, without status epilepticus: Secondary | ICD-10-CM | POA: Diagnosis present

## 2016-11-03 LAB — BASIC METABOLIC PANEL
ANION GAP: 6 (ref 5–15)
BUN: 13 mg/dL (ref 6–20)
CALCIUM: 8.9 mg/dL (ref 8.9–10.3)
CO2: 23 mmol/L (ref 22–32)
Chloride: 113 mmol/L — ABNORMAL HIGH (ref 101–111)
Creatinine, Ser: 1.05 mg/dL — ABNORMAL HIGH (ref 0.44–1.00)
GFR calc Af Amer: >60 mL/min (ref >60)
GFR calc non Af Amer: >60 mL/min (ref >60)
GLUCOSE: 92 mg/dL (ref 65–99)
POTASSIUM: 3.4 mmol/L — AB (ref 3.5–5.1)
Sodium: 142 mmol/L (ref 135–145)

## 2016-11-03 LAB — CBC WITH DIFFERENTIAL/PLATELET
BASOS ABS: 0.1 10*3/uL (ref 0.0–0.1)
BASOS PCT: 1 %
EOS ABS: 0.4 10*3/uL (ref 0.0–0.7)
EOS PCT: 7 %
HEMATOCRIT: 34 % — AB (ref 36.0–46.0)
Hemoglobin: 11.5 g/dL — ABNORMAL LOW (ref 12.0–15.0)
Lymphocytes Relative: 47 %
Lymphs Abs: 2.5 10*3/uL (ref 0.7–4.0)
MCH: 29.1 pg (ref 26.0–34.0)
MCHC: 33.8 g/dL (ref 30.0–36.0)
MCV: 86.1 fL (ref 78.0–100.0)
Monocytes Absolute: 0.3 10*3/uL (ref 0.1–1.0)
Monocytes Relative: 5 %
NEUTROS PCT: 40 %
Neutro Abs: 2.1 10*3/uL (ref 1.7–7.7)
Platelets: 261 10*3/uL (ref 150–400)
RBC: 3.95 MIL/uL (ref 3.87–5.11)
RDW: 13.6 % (ref 11.5–15.5)
WBC: 5.2 10*3/uL (ref 4.0–10.5)

## 2016-11-03 LAB — CBG MONITORING, ED: Glucose-Capillary: 84 mg/dL (ref 65–99)

## 2016-11-03 LAB — I-STAT BETA HCG BLOOD, ED (MC, WL, AP ONLY): I-stat hCG, quantitative: <5 m[IU]/mL (ref <5)

## 2016-11-03 MED ORDER — CEFDINIR 300 MG PO CAPS: 300.0000 mg | ORAL_CAPSULE | Freq: Two times a day (BID) | ORAL | 0 refills | Status: DC

## 2016-11-03 MED ORDER — SODIUM CHLORIDE 0.9 % IV SOLN
1000.0000 mg | Freq: Once | INTRAVENOUS | Status: AC
  Administered 2016-11-03: 1000 mg via INTRAVENOUS
  Filled 2016-11-03: qty 10

## 2016-11-03 MED ORDER — ACETAMINOPHEN 325 MG PO TABS
650.0000 mg | ORAL_TABLET | Freq: Once | ORAL | Status: AC
  Administered 2016-11-03: 650 mg via ORAL
  Filled 2016-11-03: qty 2

## 2016-11-03 NOTE — ED Provider Notes (Signed)
[facility] DEPT Provider Note   CSN: [id number] Arrival date & time: 11/03/16  1231     History   Chief Complaint Chief Complaint  Patient presents with  . Seizures    HPI Nicole [name] is a 40 y.o. female.  The history is provided by the patient and medical records.  Seizures      40 year old female with history of anemia, asthma, migraine headaches, seizure disorder on Keppra, presenting to the ED following a seizure. Patient had a witnessed tonic-clonic seizure that lasted about 2 minutes. She was postictal immediately upon EMS arrival, however has awoken more since arrival here. She reports she has been compliant with her Keppra, although she does have a history of noncompliance in the past. States she has overall been well recently. She does have some nasal congestion and left ear pain. She denies any fever or chill. No cough, chest pain, shortness of breath. She's not had any recent sick contacts. She does report increased stress over the past month due to the passing of her grandmother. States it has been difficult this time of year with the holidays. She is followed by neurology at [facility].  Past Medical History:  Diagnosis Date  . Anemia   . Asthma   . Blood transfusion 2011   r/t anemia  . Fibroid   . Migraine   . Migraine   . Seizures [facility])    last sz Jan 2012  . Seizures ([place])   . Sickle cell trait ([place])   . Sickle cell trait ([place])   . Tooth abscess     Patient Active Problem List   Diagnosis Date Noted  . 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 Depression   . Generalized abdominal pain   . Fatigue 10/28/2015  . Adjustment disorder with depressed mood 10/21/2015  . Dizziness   . Weakness 11/18/2014  . OSA (obstructive sleep apnea) 10/21/2014  . Acute rhinosinusitis 10/09/2014  . Leg pain, lateral 09/04/2014  . Migraines 08/13/2014  . Seizure ([place]) 06/04/2014  . Leiomyoma of uterus, unspecified 02/11/2014  . GERD (gastroesophageal reflux disease)  01/28/2014  . Candidiasis of vulva and vagina 01/28/2014  . Unspecified symptom associated with female genital organs 01/28/2014  . Chest pain 01/20/2014  . Acute pericarditis, unspecified 01/20/2014    Past Surgical History:  Procedure Laterality Date  . TUBAL LIGATION Bilateral 2005    OB History    Gravida Para Term Preterm AB Living   3 2 1 1 1 2    SAB TAB Ectopic Multiple Live Births   0 0 1 0 2       Home Medications    Prior to Admission medications   Medication Sig Start Date End Date Taking? Authorizing Provider  acetaminophen (TYLENOL) 500 MG tablet Take 1 tablet (500 mg total) by mouth every 6 (six) hours as needed. Patient not taking: Reported on 10/15/2016 09/27/15   Gloriann Loan, PA-C  albuterol (PROVENTIL HFA;VENTOLIN HFA) 108 (90 BASE) MCG/ACT inhaler Inhale 2 puffs into the lungs every 4 (four) hours as needed for wheezing or shortness of breath (or coughing). 03/11/15   Shelly Bombard, MD  cyclobenzaprine (FLEXERIL) 10 MG tablet Take 1 tablet (10 mg total) by mouth 3 (three) times daily as needed for muscle spasms. 07/20/16   Roxanna Mew, PA-C  FLUoxetine (PROZAC) 20 MG capsule Take 1 capsule (20 mg total) by mouth daily. 10/28/15   Veatrice Bourbon, MD  gabapentin (NEURONTIN) 300 MG capsule Take 1 capsule (300 mg  total) by mouth 3 (three) times daily. Patient taking differently: Take 300-1,200 mg by mouth 3 (three) times daily. Takes one capsule twice daily and then takes four capsules at bedtime. 01/25/15   Tanna Furry, MD  ibuprofen (ADVIL,MOTRIN) 800 MG tablet TAKE 1 TABLET BY MOUTH EVERY 8 HOURS AS NEEDED FOR MILD TO MODERATE PAIN 10/03/16   Historical Provider, MD  levETIRAcetam (KEPPRA) 1000 MG tablet TAKE 1 TABLET BY MOUTH TWICE A DAY Patient taking differently: TAKE 1000 MG IN THE MORNING AND 2000 MG AT BEDTIME 03/07/16   Leone Brand, MD  medroxyPROGESTERone (DEPO-PROVERA) 150 MG/ML injection Inject 1 mL (150 mg total) into the muscle every 3 (three)  months. Inject on  the 22nd of each month 03/30/16   Shelly Bombard, MD  metroNIDAZOLE (FLAGYL) 500 MG tablet Take 1 tablet (500 mg total) by mouth 2 (two) times daily. Patient not taking: Reported on 10/15/2016 10/04/16   Shelly Bombard, MD  oxyCODONE-acetaminophen (PERCOCET) 10-325 MG tablet Take 1 tablet by mouth every 4 (four) hours as needed for pain. 09/29/16   Shelly Bombard, MD  topiramate (TOPAMAX) 25 MG tablet TAKE 3 TABLETS BY MOUTH DAILY Patient taking differently: TAKE 75mg  BY MOUTH TWICE DAILY 03/07/16   Leone Brand, MD    Family History Family History  Problem Relation Age of Onset  . Hypertension Mother   . Diabetes Mother   . Cancer Mother     Colon, brain, 2 other  . Asthma Mother   . Clotting disorder Mother   . Heart disease Mother     62, stents  . Stroke Mother     2012  . Diabetes Father   . Asthma Father   . Clotting disorder Father   . Sickle cell anemia Father   . Heart disease Father     2000, stents  . Hypertension Father   . Stroke Father     2007  . Diabetes Maternal Grandmother   . Asthma Sister   . Asthma Brother   . Asthma Paternal Grandmother   . Anesthesia problems Neg Hx     Social History Social History  Substance Use Topics  . Smoking status: Former Smoker    Quit date: 03/27/2014  . Smokeless tobacco: Never Used  . Alcohol use No     Allergies   Shrimp [shellfish allergy]; Tea; Penicillins; and Sulfa antibiotics   Review of Systems Review of Systems  HENT: Positive for congestion and ear pain.   Neurological: Positive for seizures.  All other systems reviewed and are negative.    Physical Exam Updated Vital Signs BP 144/90 (BP Location: Left Arm)   Pulse 68   Temp 98.6 F (37 C)   Resp 16   SpO2 100%   Physical Exam  Constitutional: She is oriented to person, place, and time. She appears well-developed and well-nourished.  HENT:  Head: Normocephalic and atraumatic.  Right Ear: Tympanic membrane is  erythematous. A middle ear effusion is present.  Left Ear: Tympanic membrane normal.  Mouth/Throat: Oropharynx is clear and moist.  Left upper central incisor is missing (old injury); no acute tongue laceration noted, no acute dental injuries; no signs of head trauma Left EAC erythematous, middle ear effusion present Right ear normal No mastoid tenderness + nasal congestion  Eyes: Conjunctivae and EOM are normal. Pupils are equal, round, and reactive to light.  Neck: Normal range of motion.  Cardiovascular: Normal rate, regular rhythm and normal heart sounds.   Pulmonary/Chest:  Effort normal and breath sounds normal.  Abdominal: Soft. Bowel sounds are normal.  Musculoskeletal: Normal range of motion.  Neurological: She is alert and oriented to person, place, and time.  AAOx3, answering questions and following commands appropriately; equal strength UE and LE bilaterally; CN grossly intact; moves all extremities appropriately without ataxia; no focal neuro deficits or facial asymmetry appreciated  Skin: Skin is warm and dry.  Psychiatric: She has a normal mood and affect.  Nursing note and vitals reviewed.    ED Treatments / Results  Labs (all labs ordered are listed, but only abnormal results are displayed) Labs Reviewed  CBC WITH DIFFERENTIAL/PLATELET - Abnormal; Notable for the following:       Result Value   Hemoglobin 11.5 (*)    HCT 34.0 (*)    All other components within normal limits  BASIC METABOLIC PANEL - Abnormal; Notable for the following:    Potassium 3.4 (*)    Chloride 113 (*)    Creatinine, Ser 1.05 (*)    All other components within normal limits  CBG MONITORING, ED  I-STAT BETA HCG BLOOD, ED ([facility], [facility], [facility] ONLY)    EKG  EKG Interpretation None       Radiology No results found.  Procedures Procedures (including critical care time)  Medications Ordered in ED Medications  levETIRAcetam (KEPPRA) 1,000 mg in sodium chloride 0.9 % 100 mL IVPB (0 mg  Intravenous Stopped 11/03/16 1411)     Initial Impression / Assessment and Plan / ED Course  I have reviewed the triage vital signs and the nursing notes.  Pertinent labs & imaging results that were available during my care of the patient were reviewed by me and considered in my medical decision making (see chart for details).  Clinical Course    40 year old female here with seizure. History of the same. She has a history of medication noncompliance, reports she has been taking her medications as directed. On arrival to ED she is awake, alert, appropriately oriented. Her exam is overall atraumatic. She does have evidence of left middle ear effusion with likely developing otitis media. She's not had any fever or chills.  Labwork was obtained and is reassuring. Patient was loaded with IV keppra.  She remains at her baseline here. She is tolerating oral fluids well. Will discharge home with [facility] given her exam findings. Recommended that she continue her Keppra and follow-up with her neurologist as well as her primary care doctor.  Discussed plan with patient, she acknowledged understanding and agreed with plan of care.  Return precautions given for new or worsening symptoms.  Final Clinical Impressions(s) / ED Diagnoses   Final diagnoses:  Seizure ([place])  Acute ear infection, left    New Prescriptions New Prescriptions   CEFDINIR (OMNICEF) 300 MG CAPSULE    Take 1 capsule (300 mg total) by mouth 2 (two) times daily.     Larene Pickett, PA-C 11/03/16 [place], MD 11/03/16 [phone]

## 2016-11-03 NOTE — ED Triage Notes (Signed)
Per EMS pt c/o witnessed tonic-clonic seizure x 2-3 minutes. Brief post-ictal state on scene. C/o headache and nausea. CBG on seen was 55.  Hx of seizure, takes Keppra as prescribed. No hx of DM, but pt reports hx of hypoglycemia leading to seizures. Seizures have been occurring with increasing frequency x few weeks, possibly due to increased stress. EMS administered 4 mg IV Zofran en route.

## 2016-11-03 NOTE — Discharge Instructions (Signed)
Make sure to continue your keppra. Take the omnicef as directed for your ear infection. Follow-up with your primary care doctor.  Recommend to follow-up with your neurologist as well. Return here for new concerns.

## 2016-11-05 ENCOUNTER — Emergency Department (HOSPITAL_COMMUNITY): Payer: 59

## 2016-11-05 ENCOUNTER — Emergency Department (HOSPITAL_COMMUNITY)
Admission: EM | Admit: 2016-11-05 | Discharge: 2016-11-05 | Disposition: A | Discharge status: Home / Self Care | Payer: 59 | Attending: Emergency Medicine | Admitting: Emergency Medicine

## 2016-11-05 ENCOUNTER — Encounter (HOSPITAL_COMMUNITY): Payer: Self-pay

## 2016-11-05 DIAGNOSIS — R531 Weakness: Secondary | ICD-10-CM | POA: Diagnosis not present

## 2016-11-05 DIAGNOSIS — J45909 Unspecified asthma, uncomplicated: Secondary | ICD-10-CM | POA: Insufficient documentation

## 2016-11-05 DIAGNOSIS — Z87891 Personal history of nicotine dependence: Secondary | ICD-10-CM | POA: Insufficient documentation

## 2016-11-05 DIAGNOSIS — R51 Headache: Secondary | ICD-10-CM | POA: Insufficient documentation

## 2016-11-05 LAB — CBC WITH DIFFERENTIAL/PLATELET
Basophils Absolute: 0.1 10*3/uL (ref 0.0–0.1)
Basophils Relative: 1 %
EOS ABS: 0.3 10*3/uL (ref 0.0–0.7)
Eosinophils Relative: 5 %
HCT: 40.6 % (ref 36.0–46.0)
HEMOGLOBIN: 14 g/dL (ref 12.0–15.0)
LYMPHS PCT: 52 %
Lymphs Abs: 2.9 10*3/uL (ref 0.7–4.0)
MCH: 29.8 pg (ref 26.0–34.0)
MCHC: 34.5 g/dL (ref 30.0–36.0)
MCV: 86.4 fL (ref 78.0–100.0)
MONO ABS: 0.2 10*3/uL (ref 0.1–1.0)
Monocytes Relative: 4 %
NEUTROS PCT: 38 %
Neutro Abs: 2.2 10*3/uL (ref 1.7–7.7)
PLATELETS: UNDETERMINED 10*3/uL (ref 150–400)
RBC: 4.7 MIL/uL (ref 3.87–5.11)
RDW: 13.4 % (ref 11.5–15.5)
WBC: 5.7 10*3/uL (ref 4.0–10.5)

## 2016-11-05 LAB — BASIC METABOLIC PANEL
ANION GAP: 10 (ref 5–15)
BUN: 10 mg/dL (ref 6–20)
CALCIUM: 8.8 mg/dL — AB (ref 8.9–10.3)
CHLORIDE: 115 mmol/L — AB (ref 101–111)
CO2: 16 mmol/L — ABNORMAL LOW (ref 22–32)
CREATININE: 0.87 mg/dL (ref 0.44–1.00)
GFR calc non Af Amer: >60 mL/min (ref >60)
Glucose, Bld: 78 mg/dL (ref 65–99)
Potassium: 3.9 mmol/L (ref 3.5–5.1)
SODIUM: 141 mmol/L (ref 135–145)

## 2016-11-05 LAB — CBG MONITORING, ED: GLUCOSE-CAPILLARY: 67 mg/dL (ref 65–99)

## 2016-11-05 MED ORDER — CLONAZEPAM 0.5 MG PO TABS: 0.5000 mg | ORAL_TABLET | Freq: Two times a day (BID) | ORAL | 0 refills | Status: DC

## 2016-11-05 MED ORDER — PSEUDOEPHEDRINE HCL 30 MG PO TABS: 30.0000 mg | ORAL_TABLET | ORAL | 0 refills | Status: DC | PRN

## 2016-11-05 NOTE — ED Notes (Signed)
Bed: WA23 Expected date:  Expected time:  Means of arrival:  Comments: EMS 

## 2016-11-05 NOTE — ED Triage Notes (Signed)
She states she feels "real weak" with moderate, generalized h/a.  She states for reasons which are not clear she "has been having a lot of seizures lately". She also tells me she was seen here a couple of days ago and was given IV Keppra and dx with a sinus infection."

## 2016-11-05 NOTE — ED Provider Notes (Signed)
[facility] DEPT Provider Note   CSN: [id number] Arrival date & time: 11/05/16  1649     History   Chief Complaint Chief Complaint  Patient presents with  . Weakness    HPI Nicole [name] is a 40 y.o. female.  Patient with past medical history remarkable for seizures presents to the emergency department with chief complaint of weakness, and syncope. She states that she was at home today and noticed that she is feeling very weak. She states that a family member came over to help her, and then she passed out. No witnessed seizure-like activity. Family member reports that the patient has had several seizures over the past several days. She was seen here 2 days ago for seizure, loaded with Keppra, and discharged to home. She had an additional seizure at next day. She states that she has had increasingly frequent migraines. She states that she will follow-up with her neurologist this week, but has been unable to so far because of the holiday weekend. There are no other associated symptoms. There are no modifying factors.   The history is provided by the patient. No language interpreter was used.    Past Medical History:  Diagnosis Date  . Anemia   . Asthma   . Blood transfusion 2011   r/t anemia  . Fibroid   . Migraine   . Migraine   . Seizures [facility])    last sz Jan 2012  . Seizures ([place])   . Sickle cell trait ([place])   . Sickle cell trait ([place])   . Tooth abscess     Patient Active Problem List   Diagnosis Date Noted  . 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 Depression   . Generalized abdominal pain   . Fatigue 10/28/2015  . Adjustment disorder with depressed mood 10/21/2015  . Dizziness   . Weakness 11/18/2014  . OSA (obstructive sleep apnea) 10/21/2014  . Acute rhinosinusitis 10/09/2014  . Leg pain, lateral 09/04/2014  . Migraines 08/13/2014  . Seizure ([place]) 06/04/2014  . Leiomyoma of uterus, unspecified 02/11/2014  . GERD (gastroesophageal reflux disease) 01/28/2014  .  Candidiasis of vulva and vagina 01/28/2014  . Unspecified symptom associated with female genital organs 01/28/2014  . Chest pain 01/20/2014  . Acute pericarditis, unspecified 01/20/2014    Past Surgical History:  Procedure Laterality Date  . TUBAL LIGATION Bilateral 2005    OB History    Gravida Para Term Preterm AB Living   3 2 1 1 1 2    SAB TAB Ectopic Multiple Live Births   0 0 1 0 2       Home Medications    Prior to Admission medications   Medication Sig Start Date End Date Taking? Authorizing Provider  acetaminophen (TYLENOL) 500 MG tablet Take 1 tablet (500 mg total) by mouth every 6 (six) hours as needed. 09/27/15   Gloriann Loan, PA-C  albuterol (PROVENTIL HFA;VENTOLIN HFA) 108 (90 BASE) MCG/ACT inhaler Inhale 2 puffs into the lungs every 4 (four) hours as needed for wheezing or shortness of breath (or coughing). 03/11/15   Shelly Bombard, MD  cefdinir (OMNICEF) 300 MG capsule Take 1 capsule (300 mg total) by mouth 2 (two) times daily. 11/03/16   Larene Pickett, PA-C  cyclobenzaprine (FLEXERIL) 10 MG tablet Take 1 tablet (10 mg total) by mouth 3 (three) times daily as needed for muscle spasms. 07/20/16   Roxanna Mew, PA-C  FLUoxetine (PROZAC) 20 MG capsule Take 1 capsule (20 mg total) by mouth  daily. 10/28/15   Veatrice Bourbon, MD  gabapentin (NEURONTIN) 300 MG capsule Take 1 capsule (300 mg total) by mouth 3 (three) times daily. Patient taking differently: Take 300-1,200 mg by mouth 3 (three) times daily. Takes one capsule twice daily and then takes four capsules at bedtime. 01/25/15   Tanna Furry, MD  ibuprofen (ADVIL,MOTRIN) 800 MG tablet TAKE 1 TABLET BY MOUTH EVERY 8 HOURS AS NEEDED FOR MILD TO MODERATE PAIN 10/03/16   Historical Provider, MD  levETIRAcetam (KEPPRA) 1000 MG tablet TAKE 1 TABLET BY MOUTH TWICE A DAY Patient taking differently: TAKE 1000 MG IN THE MORNING AND 2000 MG AT BEDTIME 03/07/16   Leone Brand, MD  medroxyPROGESTERone (DEPO-PROVERA) 150 MG/ML  injection Inject 1 mL (150 mg total) into the muscle every 3 (three) months. Inject on  the 22nd of each month 03/30/16   Shelly Bombard, MD  metroNIDAZOLE (FLAGYL) 500 MG tablet Take 1 tablet (500 mg total) by mouth 2 (two) times daily. Patient not taking: Reported on 11/03/2016 10/04/16   Shelly Bombard, MD  oxyCODONE-acetaminophen (PERCOCET) 10-325 MG tablet Take 1 tablet by mouth every 4 (four) hours as needed for pain. 09/29/16   Shelly Bombard, MD  SUMAtriptan Succinate (IMITREX PO) Take 1 tablet by mouth as needed (headaches). Pt does not know dosage, and her pharmacy said if she had it filled at their store, it has been so long it has fallen off their record.    Historical Provider, MD  topiramate (TOPAMAX) 25 MG tablet TAKE 3 TABLETS BY MOUTH DAILY Patient taking differently: TAKE 75mg  BY MOUTH TWICE DAILY 03/07/16   Leone Brand, MD    Family History Family History  Problem Relation Age of Onset  . Hypertension Mother   . Diabetes Mother   . Cancer Mother     Colon, brain, 2 other  . Asthma Mother   . Clotting disorder Mother   . Heart disease Mother     69, stents  . Stroke Mother     2012  . Diabetes Father   . Asthma Father   . Clotting disorder Father   . Sickle cell anemia Father   . Heart disease Father     2000, stents  . Hypertension Father   . Stroke Father     2007  . Diabetes Maternal Grandmother   . Asthma Sister   . Asthma Brother   . Asthma Paternal Grandmother   . Anesthesia problems Neg Hx     Social History Social History  Substance Use Topics  . Smoking status: Former Smoker    Quit date: 03/27/2014  . Smokeless tobacco: Never Used  . Alcohol use No     Allergies   Shrimp [shellfish allergy]; Tea; Penicillins; and Sulfa antibiotics   Review of Systems Review of Systems  Neurological: Positive for seizures, syncope and headaches.  All other systems reviewed and are negative.    Physical Exam Updated Vital Signs BP 105/77  (BP Location: Left Arm)   Pulse 83   Temp 98 F (36.7 C) (Oral)   Resp 16   SpO2 98%   Physical Exam  Constitutional: She is oriented to person, place, and time. She appears well-developed and well-nourished.  HENT:  Head: Normocephalic and atraumatic.  Eyes: Conjunctivae and EOM are normal. Pupils are equal, round, and reactive to light.  Neck: Normal range of motion. Neck supple.  Cardiovascular: Normal rate and regular rhythm.  Exam reveals no gallop and no friction  rub.   No murmur heard. Pulmonary/Chest: Effort normal and breath sounds normal. No respiratory distress. She has no wheezes. She has no rales. She exhibits no tenderness.  Abdominal: Soft. Bowel sounds are normal. She exhibits no distension and no mass. There is no tenderness. There is no rebound and no guarding.  Musculoskeletal: Normal range of motion. She exhibits no edema or tenderness.  Neurological: She is alert and oriented to person, place, and time.  Speech is clear, movements are goal oriented, CN 3-12 intact  Skin: Skin is warm and dry.  Psychiatric: She has a normal mood and affect. Her behavior is normal. Judgment and thought content normal.  Nursing note and vitals reviewed.    ED Treatments / Results  Labs (all labs ordered are listed, but only abnormal results are displayed) Labs Reviewed  CBC WITH DIFFERENTIAL/PLATELET  BASIC METABOLIC PANEL  CBG MONITORING, ED    EKG  EKG Interpretation None       Radiology No results found.  Procedures Procedures (including critical care time)  Medications Ordered in ED Medications - No data to display   Initial Impression / Assessment and Plan / ED Course  I have reviewed the triage vital signs and the nursing notes.  Pertinent labs & imaging results that were available during my care of the patient were reviewed by me and considered in my medical decision making (see chart for details).  Clinical Course     Patient with multiple seizures  in the past 2 days.  Seen 2 days ago for seizures, but went home and had an additional seizure the next day.  She had a questionable syncopal episode.  Will check CT head give worsening and increasingly frequent headaches.  Will check labs.  CT and labs are reassuring. Patient discussed with Dr. Ralene Bathe, who recommends neurology consult.  I discussed patient with Dr. Leonel Ramsay, who suspects that the patient seizures and weakness are secondary to her sinus infection. Believe this to be lowering the seizure threshold. Recommends prescribing Klonopin 3 days. Close follow-up with outpatient neurology. Patient advised that she may not drive for the next 6 months per [facility].  Final Clinical Impressions(s) / ED Diagnoses   Final diagnoses:  Weakness    New Prescriptions Discharge Medication List as of 11/05/2016  9:25 PM    START taking these medications   Details  clonazePAM (KLONOPIN) 0.5 MG tablet Take 1 tablet (0.5 mg total) by mouth 2 (two) times daily., Starting Sun 11/05/2016, Print    pseudoephedrine (SUDAFED) 30 MG tablet Take 1 tablet (30 mg total) by mouth every 4 (four) hours as needed for congestion., Starting Sun 11/05/2016, Print         Montine Circle, PA-C 11/05/16 2202    Quintella Reichert, MD 11/06/16 [phone]

## 2016-11-05 NOTE — Discharge Instructions (Signed)
You have been evaluated tonight for weakness.  It is thought that your weakness and recent seizures are due to your sinus infection and antibiotics.  You will need to continue taking the antibiotics, but will need to take Clonazepam as well to prevent seizures for the next 3 days.  YOU MAY NOT DRIVE.  It is against  law to drive with 6 months of having a seizure.  Please follow-up with your neurologist.

## 2016-11-05 NOTE — ED Notes (Signed)
Was sent to room by NT because pt will only allow a phlebotomist to stick her.  Upon entering the room, pt was not too nice.  Was advised by pt to stick her hand because "they are better in the hand."  Was only able to get what was ordered and very little amount.  Told pt that I would like to get the ultrasound machine and do a better stick and get extra blood in case they add on labs.  Pt said no " what you get is going to have to work and if its not then too bad."  Did tell pt that I am the only phlebotomist on shift and there is no one else that comes in after I get off in 20 min.  Pt understood and still said too bad.  RN and EDP aware.

## 2016-11-05 NOTE — ED Notes (Signed)
Pt had 240 ml of soda.  Pt drank without any complaints of nausea or vomiting.

## 2016-11-23 ENCOUNTER — Emergency Department (HOSPITAL_COMMUNITY)
Admission: EM | Admit: 2016-11-23 | Discharge: 2016-11-23 | Disposition: A | Discharge status: Home / Self Care | Payer: 59 | Attending: Physician Assistant | Admitting: Physician Assistant

## 2016-11-23 DIAGNOSIS — Z87891 Personal history of nicotine dependence: Secondary | ICD-10-CM | POA: Insufficient documentation

## 2016-11-23 DIAGNOSIS — Z79899 Other long term (current) drug therapy: Secondary | ICD-10-CM | POA: Diagnosis not present

## 2016-11-23 DIAGNOSIS — K0889 Other specified disorders of teeth and supporting structures: Secondary | ICD-10-CM | POA: Insufficient documentation

## 2016-11-23 DIAGNOSIS — J45909 Unspecified asthma, uncomplicated: Secondary | ICD-10-CM | POA: Diagnosis not present

## 2016-11-23 MED ORDER — CLINDAMYCIN HCL 150 MG PO CAPS: 450.0000 mg | ORAL_CAPSULE | Freq: Three times a day (TID) | ORAL | 0 refills | Status: DC

## 2016-11-23 MED ORDER — CLINDAMYCIN HCL 150 MG PO CAPS: 450.0000 mg | ORAL_CAPSULE | Freq: Three times a day (TID) | ORAL | 0 refills | Status: AC

## 2016-11-23 NOTE — ED Provider Notes (Signed)
[facility] DEPT Provider Note   CSN: [id number] Arrival date & time: 11/23/16  1259  By signing my name below, I, Soijett Blue, attest that this documentation has been prepared under the direction and in the presence of Avie Echevaria, PA-C Electronically Signed: Soijett Blue, ED Scribe. 11/23/16. 2:27 PM.  History   Chief Complaint Chief Complaint  Patient presents with  . Dental Pain    HPI Nicole [name] is a 39 y.o. female who presents to the Emergency Department complaining of throbbing left upper central dental pain onset 2 weeks ago. Pt notes that she broke her left upper central tooth during a seizure that occurred 2 weeks ago. Pt states that her left upper central dental pain is worsened with temperature change. Pt denies any alleviating factors for her left upper central dental pain. Pt states that she attempted to be seen by a dentist at [facility] this morning, but she missed the time frame for walk-in patients due to being at work. Pt notes that she was informed by [facility] that she would possibly be able to get an appointment next week. She states that she has tried extra strength tylenol, 800 mg ibuprofen, and warm saltwater gargles with no relief for her symptoms. She denies fever, chills, nausea, vomiting, CP, SOB, sore throat, and any other symptoms.    The history is provided by the patient. No language interpreter was used.    Past Medical History:  Diagnosis Date  . Anemia   . Asthma   . Blood transfusion 2011   r/t anemia  . Fibroid   . Migraine   . Migraine   . Seizures [facility])    last sz Jan 2012  . Seizures ([place])   . Sickle cell trait ([place])   . Sickle cell trait ([place])   . Tooth abscess     Patient Active Problem List   Diagnosis Date Noted  . 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 Depression   . Generalized abdominal pain   . Fatigue 10/28/2015  . Adjustment disorder with depressed mood 10/21/2015  . Dizziness   . Weakness 11/18/2014  . OSA  (obstructive sleep apnea) 10/21/2014  . Acute rhinosinusitis 10/09/2014  . Leg pain, lateral 09/04/2014  . Migraines 08/13/2014  . Seizure ([place]) 06/04/2014  . Leiomyoma of uterus, unspecified 02/11/2014  . GERD (gastroesophageal reflux disease) 01/28/2014  . Candidiasis of vulva and vagina 01/28/2014  . Unspecified symptom associated with female genital organs 01/28/2014  . Chest pain 01/20/2014  . Acute pericarditis, unspecified 01/20/2014    Past Surgical History:  Procedure Laterality Date  . TUBAL LIGATION Bilateral 2005    OB History    Gravida Para Term Preterm AB Living   3 2 1 1 1 2    SAB TAB Ectopic Multiple Live Births   0 0 1 0 2       Home Medications    Prior to Admission medications   Medication Sig Start Date End Date Taking? Authorizing Provider  acetaminophen (TYLENOL) 500 MG tablet Take 1 tablet (500 mg total) by mouth every 6 (six) hours as needed. 09/27/15   Gloriann Loan, PA-C  albuterol (PROVENTIL HFA;VENTOLIN HFA) 108 (90 BASE) MCG/ACT inhaler Inhale 2 puffs into the lungs every 4 (four) hours as needed for wheezing or shortness of breath (or coughing). 03/11/15   Shelly Bombard, MD  cefdinir (OMNICEF) 300 MG capsule Take 1 capsule (300 mg total) by mouth 2 (two) times daily. 11/03/16   Larene Pickett,  PA-C  clindamycin (CLEOCIN) 150 MG capsule Take 3 capsules (450 mg total) by mouth 3 (three) times daily. 11/23/16 11/30/16  Emeline General, PA-C  clonazePAM (KLONOPIN) 0.5 MG tablet Take 1 tablet (0.5 mg total) by mouth 2 (two) times daily. 11/05/16   Montine Circle, PA-C  cyclobenzaprine (FLEXERIL) 10 MG tablet Take 1 tablet (10 mg total) by mouth 3 (three) times daily as needed for muscle spasms. 07/20/16   Roxanna Mew, PA-C  FLUoxetine (PROZAC) 20 MG capsule Take 1 capsule (20 mg total) by mouth daily. 10/28/15   Veatrice Bourbon, MD  gabapentin (NEURONTIN) 300 MG capsule Take 1 capsule (300 mg total) by mouth 3 (three) times daily. Patient  taking differently: Take 300-1,200 mg by mouth 3 (three) times daily. Takes one capsule twice daily and then takes four capsules at bedtime. 01/25/15   Tanna Furry, MD  ibuprofen (ADVIL,MOTRIN) 800 MG tablet TAKE 1 TABLET BY MOUTH EVERY 8 HOURS AS NEEDED FOR MILD TO MODERATE PAIN 10/03/16   Historical Provider, MD  levETIRAcetam (KEPPRA) 1000 MG tablet TAKE 1 TABLET BY MOUTH TWICE A DAY Patient taking differently: TAKE 1000 MG IN THE MORNING AND 2000 MG AT BEDTIME 03/07/16   Leone Brand, MD  medroxyPROGESTERone (DEPO-PROVERA) 150 MG/ML injection Inject 1 mL (150 mg total) into the muscle every 3 (three) months. Inject on  the 22nd of each month 03/30/16   Shelly Bombard, MD  metroNIDAZOLE (FLAGYL) 500 MG tablet Take 1 tablet (500 mg total) by mouth 2 (two) times daily. Patient not taking: Reported on 11/03/2016 10/04/16   Shelly Bombard, MD  oxyCODONE-acetaminophen (PERCOCET) 10-325 MG tablet Take 1 tablet by mouth every 4 (four) hours as needed for pain. 09/29/16   Shelly Bombard, MD  pseudoephedrine (SUDAFED) 30 MG tablet Take 1 tablet (30 mg total) by mouth every 4 (four) hours as needed for congestion. 11/05/16   Montine Circle, PA-C  SUMAtriptan Succinate (IMITREX PO) Take 1 tablet by mouth as needed (headaches). Pt does not know dosage, and her pharmacy said if she had it filled at their store, it has been so long it has fallen off their record.    Historical Provider, MD  topiramate (TOPAMAX) 25 MG tablet TAKE 3 TABLETS BY MOUTH DAILY Patient taking differently: TAKE 75mg  BY MOUTH TWICE DAILY 03/07/16   Leone Brand, MD    Family History Family History  Problem Relation Age of Onset  . Hypertension Mother   . Diabetes Mother   . Cancer Mother     Colon, brain, 2 other  . Asthma Mother   . Clotting disorder Mother   . Heart disease Mother     20, stents  . Stroke Mother     2012  . Diabetes Father   . Asthma Father   . Clotting disorder Father   . Sickle cell anemia  Father   . Heart disease Father     2000, stents  . Hypertension Father   . Stroke Father     2007  . Diabetes Maternal Grandmother   . Asthma Sister   . Asthma Brother   . Asthma Paternal Grandmother   . Anesthesia problems Neg Hx     Social History Social History  Substance Use Topics  . Smoking status: Former Smoker    Quit date: 03/27/2014  . Smokeless tobacco: Never Used  . Alcohol use No     Allergies   Shrimp [shellfish allergy]; Tea; Penicillins; and Sulfa antibiotics  Review of Systems Review of Systems  Constitutional: Negative for chills and fever.  HENT: Positive for dental problem (left upper central). Negative for drooling, facial swelling, sore throat and trouble swallowing.   Respiratory: Negative for cough, choking, chest tightness, shortness of breath and wheezing.   Cardiovascular: Negative for chest pain and palpitations.  Gastrointestinal: Negative for abdominal pain, nausea and vomiting.  Musculoskeletal: Negative for neck pain and neck stiffness.  Skin: Negative for color change, pallor and rash.  Neurological: Negative for dizziness.    Physical Exam Updated Vital Signs BP 114/59   Pulse 70   Temp 97.8 F (36.6 C) (Oral)   Resp 18   SpO2 100%   Physical Exam  Constitutional: She appears well-developed and well-nourished. No distress.  Patient is afebrile, non-toxic appearing, sitting in chair in mild discomfort, no acute distress  HENT:  Head: Normocephalic and atraumatic.  Mouth/Throat: Uvula is midline, oropharynx is clear and moist and mucous membranes are normal. No trismus in the jaw. Dental caries present. No dental abscesses or uvula swelling. No oropharyngeal exudate, posterior oropharyngeal edema or posterior oropharyngeal erythema.    Sublingual space non-tender to palpation. No edema. Or oral abscess.   Eyes: EOM are normal. Right eye exhibits no discharge. Left eye exhibits no discharge.  Neck: Normal range of motion. Neck  supple.  Cardiovascular: Normal rate, regular rhythm and normal heart sounds.  Exam reveals no gallop and no friction rub.   No murmur heard. Pulmonary/Chest: Effort normal and breath sounds normal. No respiratory distress. She has no wheezes. She has no rales.  Abdominal: She exhibits no distension.  Musculoskeletal: Normal range of motion.  Neurological: She is alert.  Skin: Skin is warm and dry. No rash noted. She is not diaphoretic. No erythema. No pallor.  Psychiatric: She has a normal mood and affect. Her behavior is normal.  Nursing note and vitals reviewed.     ED Treatments / Results  DIAGNOSTIC STUDIES: Oxygen Saturation is 100% on RA, nl by my interpretation.    COORDINATION OF CARE: 2:27 PM Discussed treatment plan with pt at bedside which includes follow up with dentist, clindamycin Rx, and continue 800 mg ibuprofen and pt agreed to plan.  Procedures Procedures (including critical care time)  Medications Ordered in ED Medications - No data to display   Initial Impression / Assessment and Plan / ED Course  I have reviewed the triage vital signs and the nursing notes.   Clinical Course   MDM Number of Diagnoses or Management Options Pain, dental:  **disregard above yellow highlighted text, error insertion**  Patient's left front upper tooth significantly decayed with no obvious abscess. Tenderness over left front upper tooth and gum. Soft and non-tender sublingual tissues, uvula midline, no airway compromise or edema. Patient is tolerating oral secretions well. Exam not concerning for Lugwig's angina.  Discharge home with symptomatic relief. Patient stating that she already has ibuprofen 800 mg and mouthwash and doesn't need anymore today. Clindamycin for 7 days since patient is allergic to Penicillin and close follow up with dentist. Patient was urged to obtain appointment with dentist.Discussed at length the importance of following up with a dentist for management  of dental pain and decay. Patient's accompanying family member was on the phone with the dental office during ED visit.  Discussed strict return precautions. Patient was advised to return to the emergency department if experiencing any worsening of symptoms including fever, chills, SOB, swelling, difficulty swallowing, nausea/vomiting or any other concerning symptoms. Patient  understood instructions and agrees with discharge plan.   Patient was also seen by supervising PA Montine Circle who agrees with assessment and plan.  Final Clinical Impressions(s) / ED Diagnoses   Final diagnoses:  Pain, dental    New Prescriptions Discharge Medication List as of 11/23/2016  2:51 PM    START taking these medications   Details  clindamycin (CLEOCIN) 150 MG capsule Take 3 capsules (450 mg total) by mouth 3 (three) times daily., Starting Thu 11/23/2016, Print       I personally performed the services described in this documentation, which was scribed in my presence. The recorded information has been reviewed and is accurate.     Emeline General, PA-C 11/24/16 0018    Courteney Julio Alm, MD 11/26/16 1958

## 2016-11-23 NOTE — Discharge Instructions (Signed)
Continue to use your previously prescribed mouth wash. Ibuprofen or tylenol for pain and follow up with dentist as soon as possible.

## 2016-11-23 NOTE — ED Triage Notes (Signed)
Pt reports lower left toothache x 34 days, no relief despite OTC meds.  sts unable to see dentist at this time. sts broken tooth due to seizure had  2 weeks ago.

## 2016-11-29 IMAGING — CT CT HEAD W/O CM
2 series · 16 of 30 positions shown, 19 images · non-contrast
Comparison: MRI brain 10/20/2015.  Head CT 10/19/2015.

CLINICAL DATA: Three seizures today. History of sickle cell anemia
and seizure disorder.

EXAM:
CT HEAD WITHOUT CONTRAST
TECHNIQUE: Contiguous axial images were obtained from the base of the skull
through the vertex without intravenous contrast.

[Series 2: head w/o · axial · non-contrast · 0.45mm/px · z∈[-159,-39]mm · 9 of 30 slices shown, 12 images]
[im 3/30  brain]
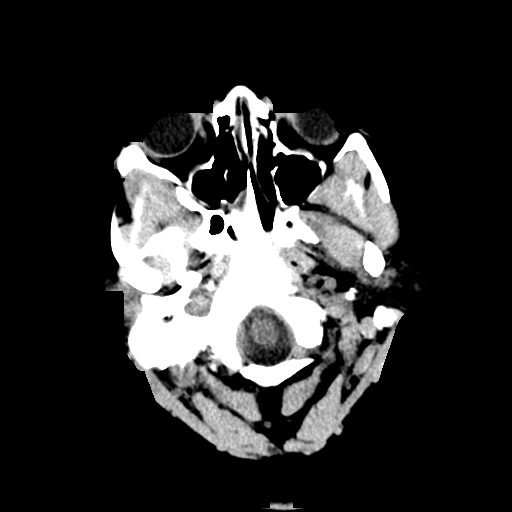
[im 3/30  bone]
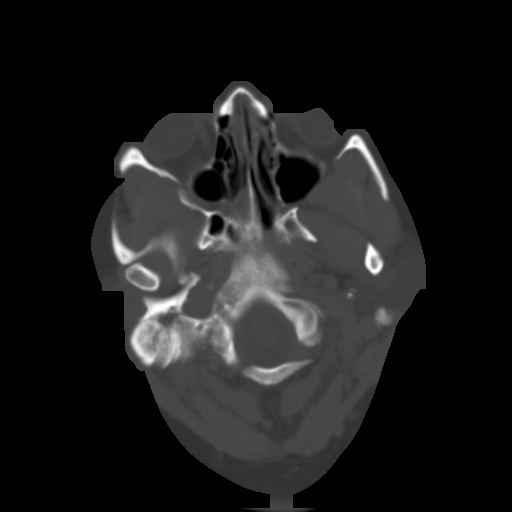
[im 6/30  brain]
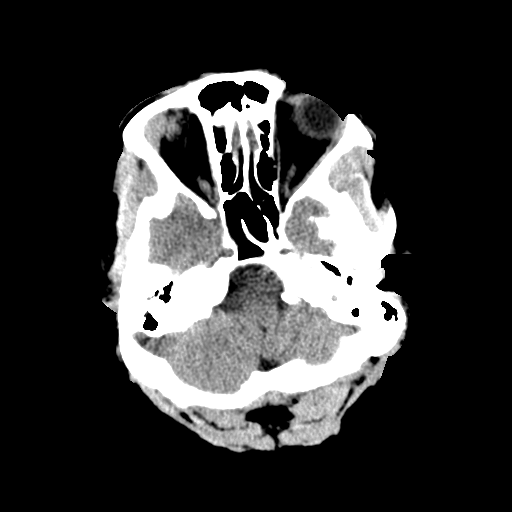
[im 9/30  brain]
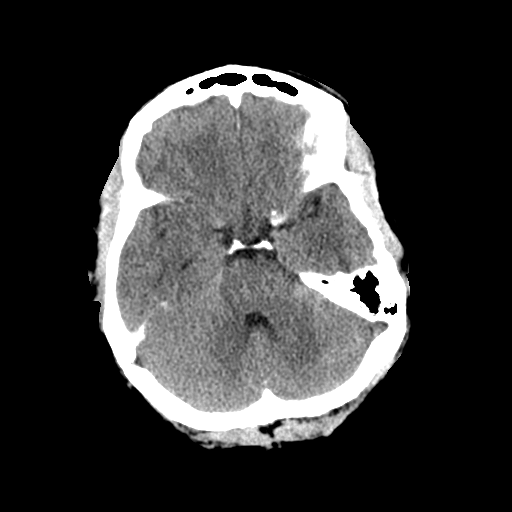
[im 12/30  brain]
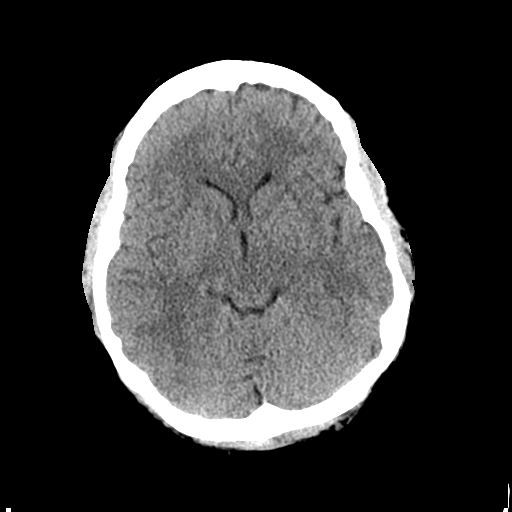
[im 15/30  brain]
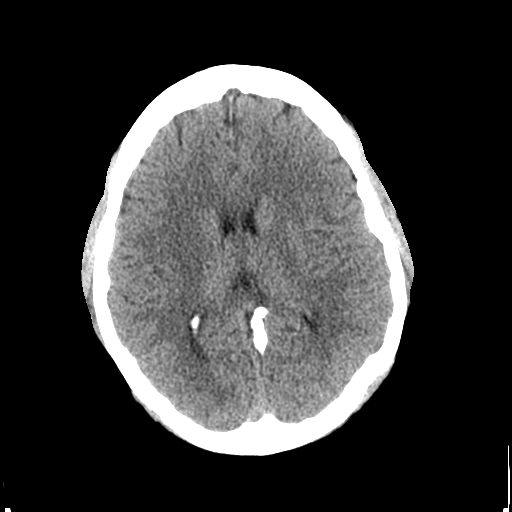
[im 15/30  bone]
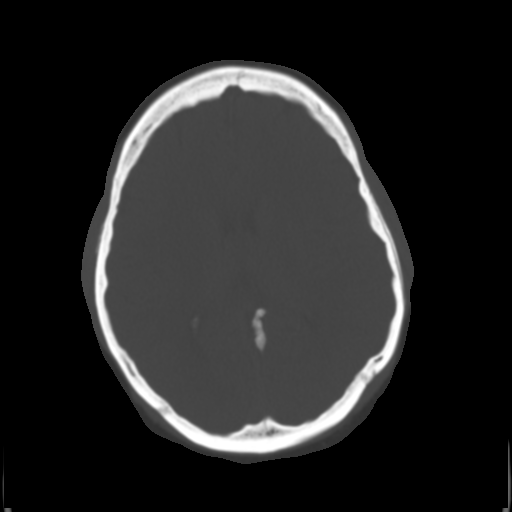
[im 18/30  brain]
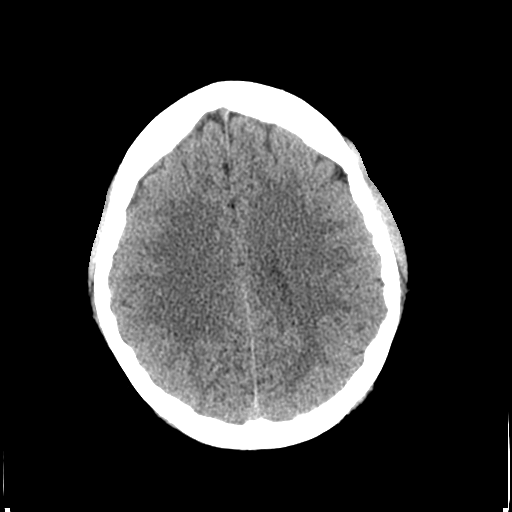
[im 21/30  brain]
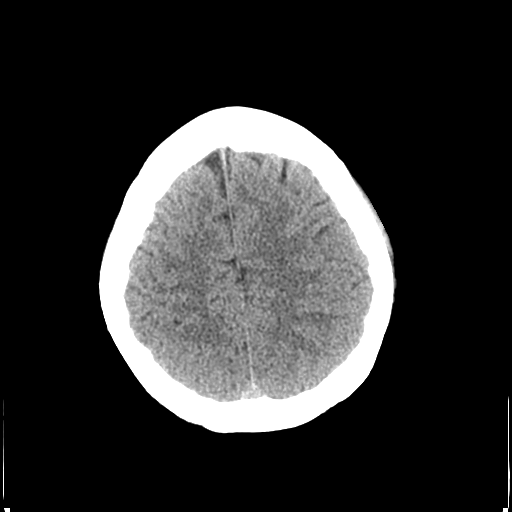
[im 24/30  brain]
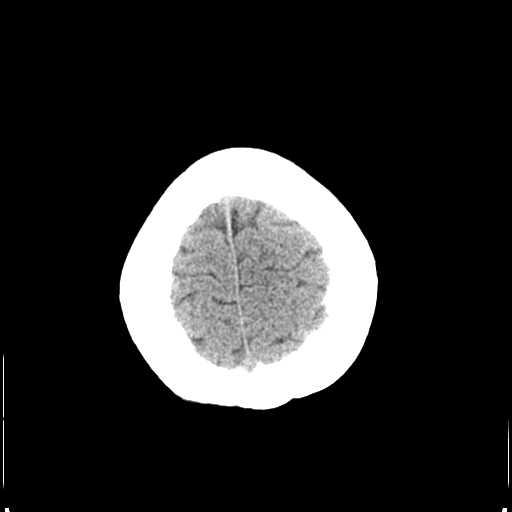
[im 27/30  brain]
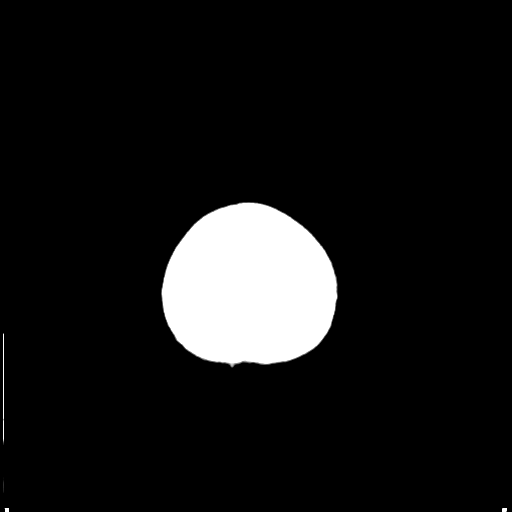
[im 27/30  bone]
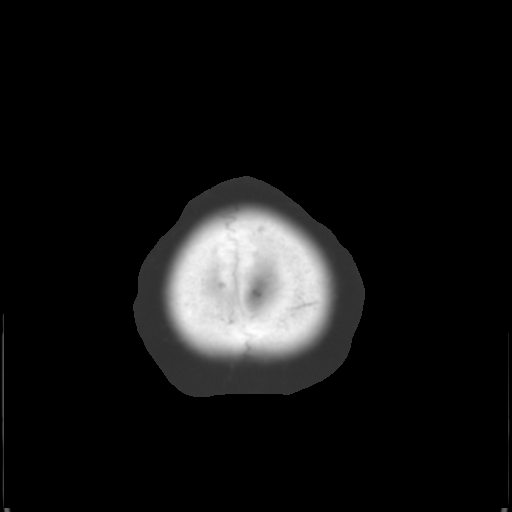

[Series 3: bone windows · axial · 0.45mm/px · z∈[-154,-58]mm · 7 of 49 slices shown]
[im 6/49  bone]
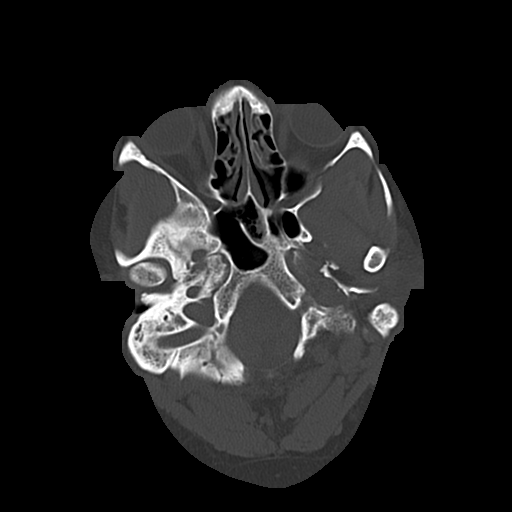
[im 11/49  bone]
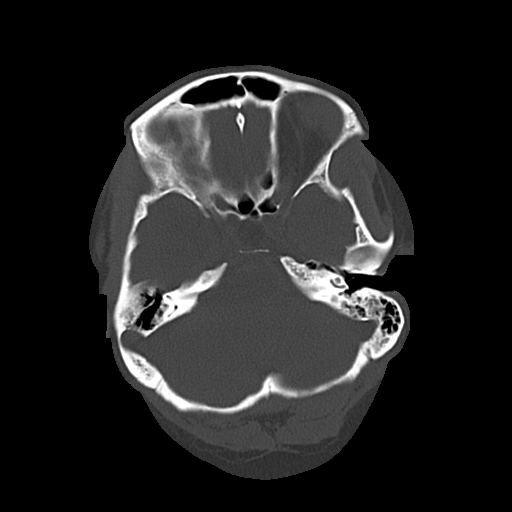
[im 17/49  bone]
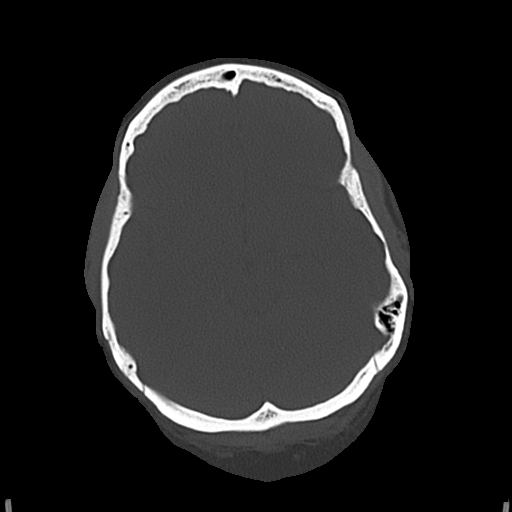
[im 22/49  bone]
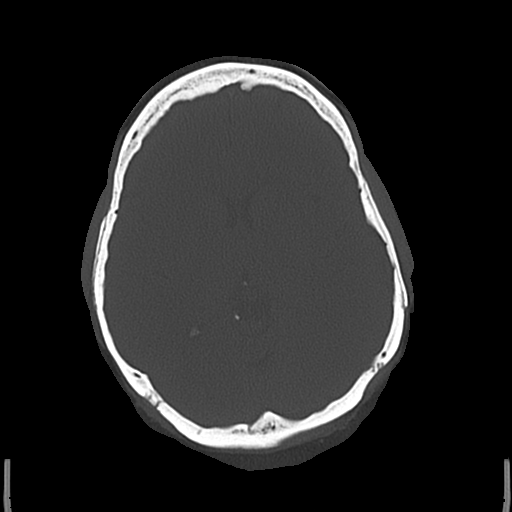
[im 27/49  bone]
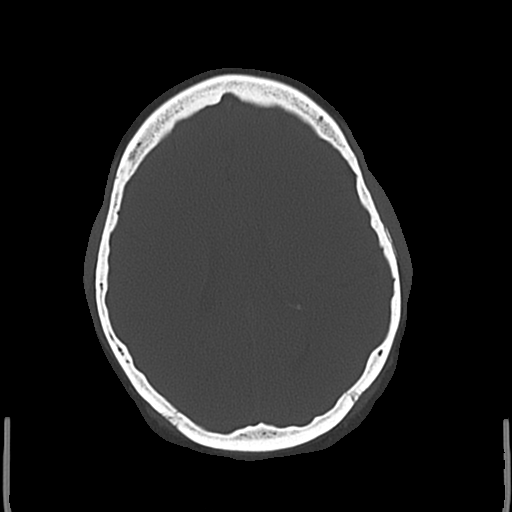
[im 33/49  bone]
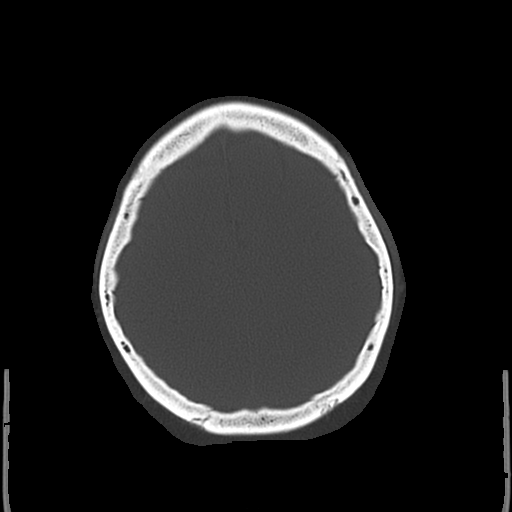
[im 38/49  bone]
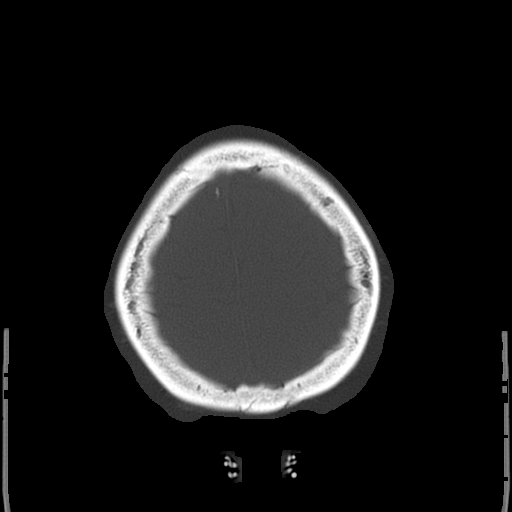

[16 of 30 positions shown; findings below may reference images not displayed]

FINDINGS: Brain: There is no evidence of acute intracranial hemorrhage, mass
lesion, brain edema or extra-axial fluid collection. The ventricles
and subarachnoid spaces are appropriately sized for age. There is no
CT evidence of acute cortical infarction. Empty sella turcica and
chronic calcification along the posterior aspect of the falx are
unchanged.

Bones/sinuses/visualized face: There is mild bubbly opacity in the
right division of the sphenoid sinus with interval improved aeration
of ethmoid sinuses. There are no air-fluid levels. The calvarium is
intact.
IMPRESSION: 1. No acute intracranial findings or explanation for increased
seizures.
2. New bubbly opacity in the right division of the sphenoid sinus
suspicious for inflammation.

## 2017-01-10 ENCOUNTER — Ambulatory Visit (INDEPENDENT_AMBULATORY_CARE_PROVIDER_SITE_OTHER): Payer: 59 | Admitting: *Deleted

## 2017-01-10 VITALS — BP 104/74 | HR 68 | Wt 235.7 lb

## 2017-01-10 DIAGNOSIS — N939 Abnormal uterine and vaginal bleeding, unspecified: Secondary | ICD-10-CM

## 2017-01-10 DIAGNOSIS — Z3042 Encounter for surveillance of injectable contraceptive: Secondary | ICD-10-CM

## 2017-01-10 MED ORDER — MEDROXYPROGESTERONE ACETATE 150 MG/ML IM SUSP
150.0000 mg | Freq: Once | INTRAMUSCULAR | Status: AC
  Administered 2017-01-10: 150 mg via INTRAMUSCULAR

## 2017-01-10 NOTE — Progress Notes (Signed)
Patient is doing well on depo. It has controled her bleeding.

## 2017-02-06 ENCOUNTER — Encounter (HOSPITAL_COMMUNITY): Payer: Self-pay | Admitting: Emergency Medicine

## 2017-02-06 ENCOUNTER — Emergency Department (HOSPITAL_COMMUNITY)
Admission: EM | Admit: 2017-02-06 | Discharge: 2017-02-06 | Disposition: A | Discharge status: Home / Self Care | Payer: 59 | Attending: Physician Assistant | Admitting: Physician Assistant

## 2017-02-06 DIAGNOSIS — S39012A Strain of muscle, fascia and tendon of lower back, initial encounter: Secondary | ICD-10-CM | POA: Diagnosis not present

## 2017-02-06 DIAGNOSIS — Y9389 Activity, other specified: Secondary | ICD-10-CM | POA: Insufficient documentation

## 2017-02-06 DIAGNOSIS — Z87891 Personal history of nicotine dependence: Secondary | ICD-10-CM | POA: Insufficient documentation

## 2017-02-06 DIAGNOSIS — X500XXA Overexertion from strenuous movement or load, initial encounter: Secondary | ICD-10-CM | POA: Insufficient documentation

## 2017-02-06 DIAGNOSIS — Y990 Civilian activity done for income or pay: Secondary | ICD-10-CM | POA: Insufficient documentation

## 2017-02-06 DIAGNOSIS — Y929 Unspecified place or not applicable: Secondary | ICD-10-CM | POA: Insufficient documentation

## 2017-02-06 DIAGNOSIS — J45909 Unspecified asthma, uncomplicated: Secondary | ICD-10-CM | POA: Diagnosis not present

## 2017-02-06 DIAGNOSIS — S3992XA Unspecified injury of lower back, initial encounter: Secondary | ICD-10-CM | POA: Diagnosis present

## 2017-02-06 IMAGING — CR DG LUMBAR SPINE 2-3V
3 series · 3 of 3 positions shown · non-contrast
Comparison: 08/09/2012

CLINICAL DATA: Fall 1 month ago. Persistent low back pain. Initial
encounter.

EXAM:
LUMBAR SPINE - 2-3 VIEW

[t lumbar spine ap]
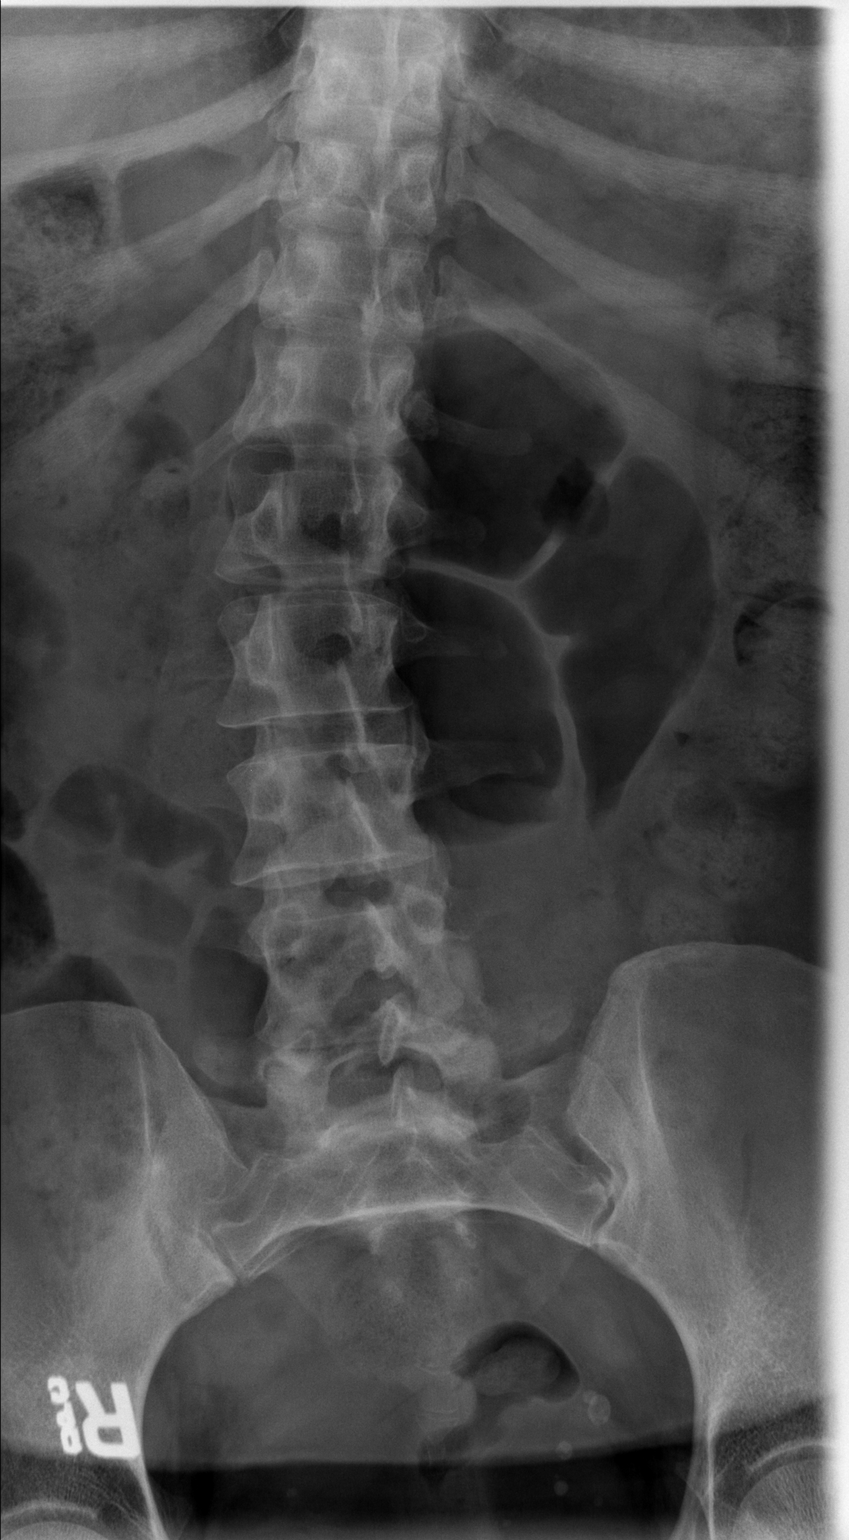

[t lumbar spine lat]
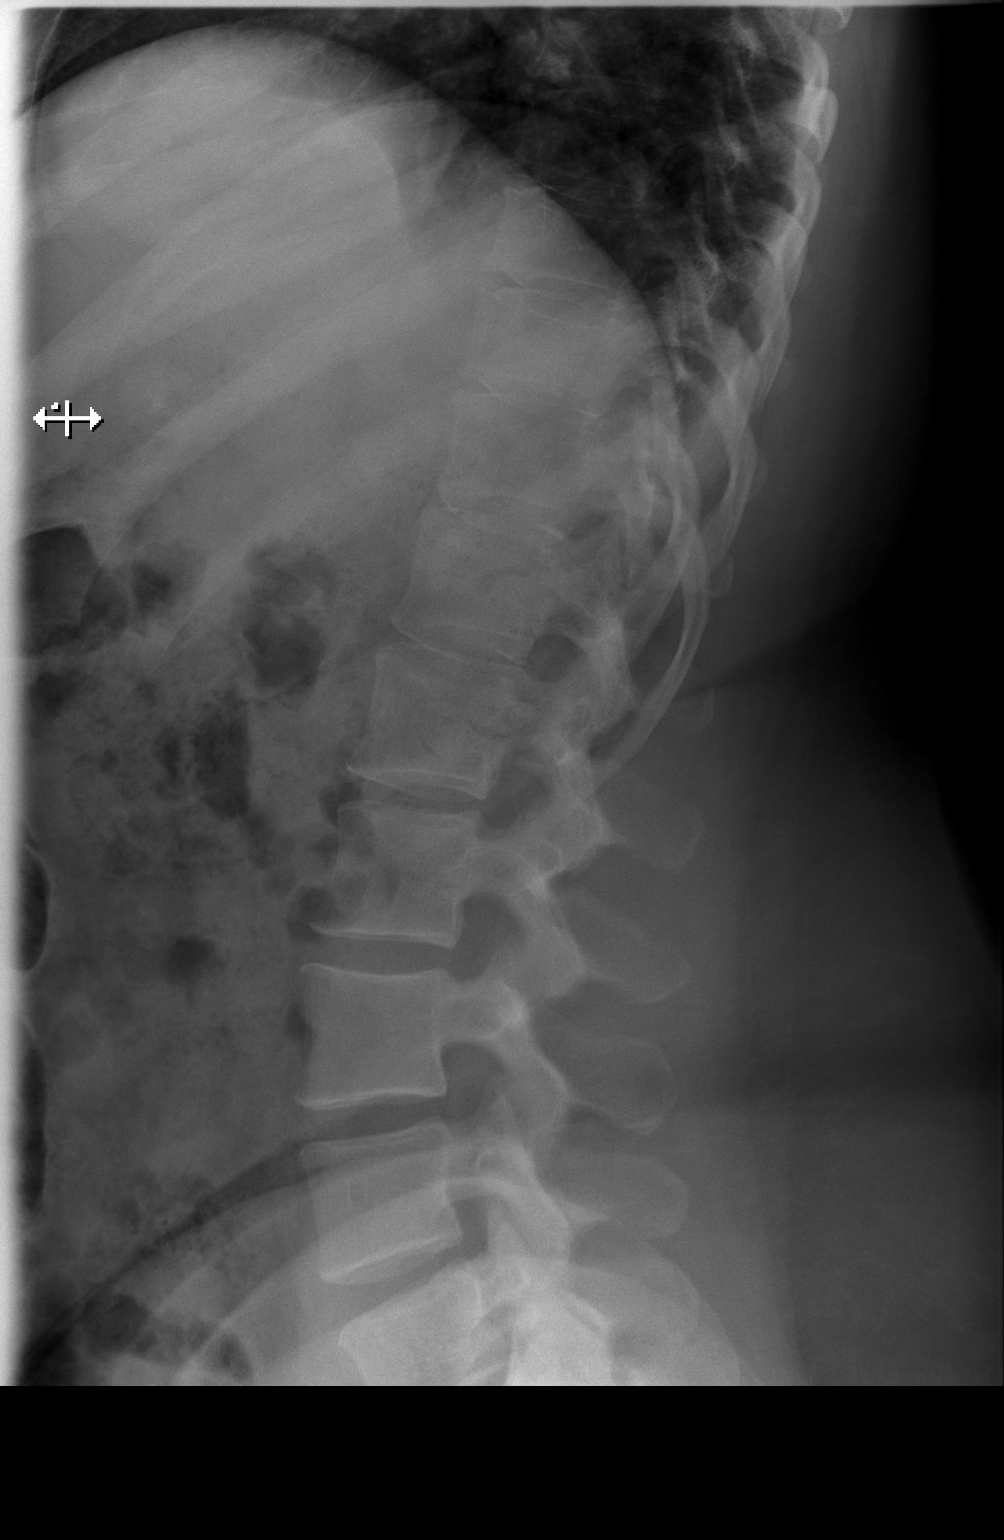

[t lumbar l-5 s-1 spot]
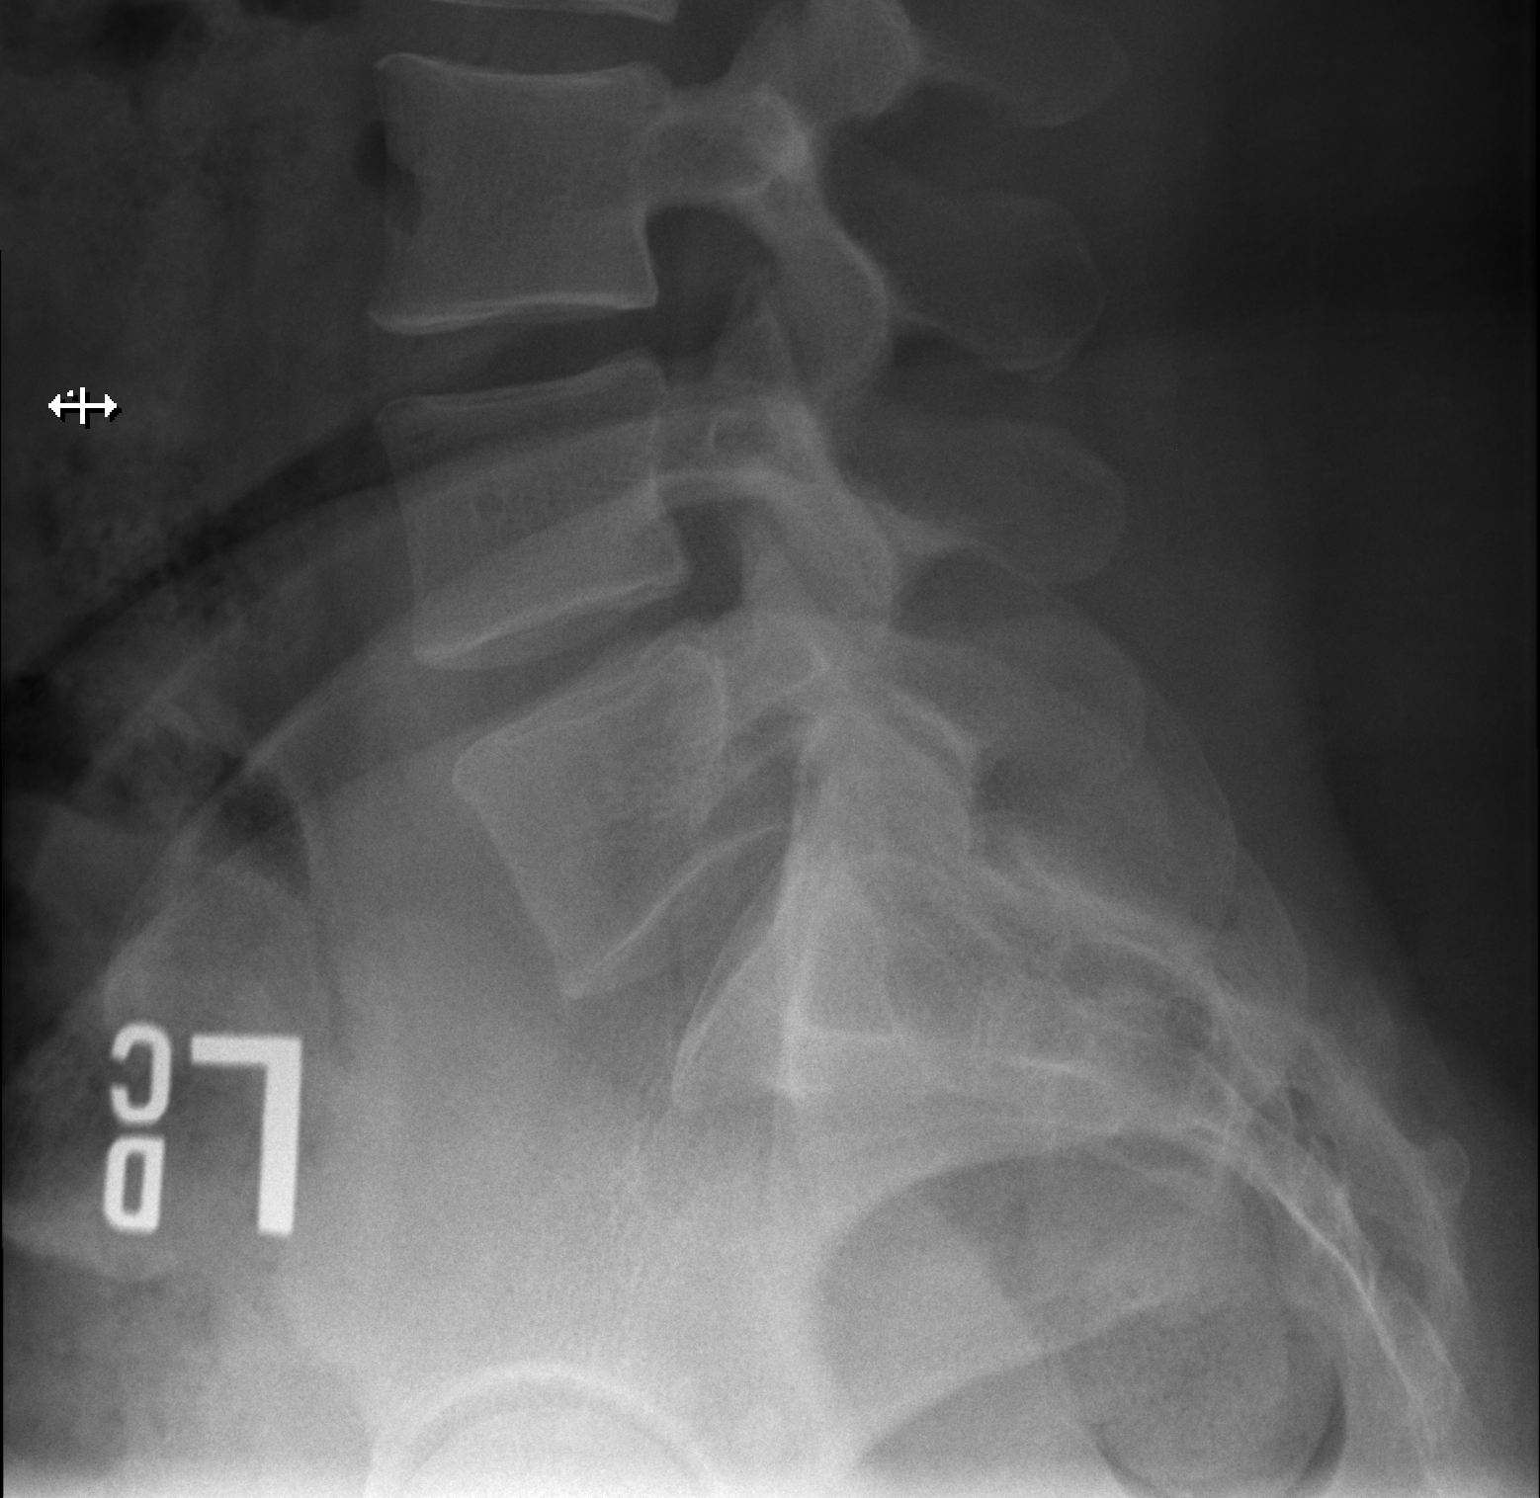

[3 of 3 positions shown; findings below may reference images not displayed]

FINDINGS: There is no evidence of lumbar spine fracture. Alignment is normal.
Intervertebral disc spaces are maintained. No focal lytic or
sclerotic bone lesions identified. Mild dextroscoliosis again noted.
IMPRESSION: No acute findings.  Stable mild lumbar dextroscoliosis.

## 2017-02-06 MED ORDER — HYDROCODONE-ACETAMINOPHEN 5-325 MG PO TABS: 1.0000 | ORAL_TABLET | Freq: Four times a day (QID) | ORAL | 0 refills | Status: DC | PRN

## 2017-02-06 MED ORDER — METHOCARBAMOL 500 MG PO TABS
1000.0000 mg | ORAL_TABLET | Freq: Once | ORAL | Status: AC
  Administered 2017-02-06: 1000 mg via ORAL
  Filled 2017-02-06: qty 2

## 2017-02-06 MED ORDER — LIDOCAINE 5 % EX PTCH: 1.0000 | MEDICATED_PATCH | CUTANEOUS | 0 refills | Status: DC

## 2017-02-06 MED ORDER — IBUPROFEN 800 MG PO TABS: 800.0000 mg | ORAL_TABLET | Freq: Three times a day (TID) | ORAL | 0 refills | Status: DC

## 2017-02-06 MED ORDER — METHOCARBAMOL 500 MG PO TABS: 500.0000 mg | ORAL_TABLET | Freq: Two times a day (BID) | ORAL | 0 refills | Status: DC

## 2017-02-06 MED ORDER — KETOROLAC TROMETHAMINE 60 MG/2ML IM SOLN
60.0000 mg | Freq: Once | INTRAMUSCULAR | Status: AC
  Administered 2017-02-06: 60 mg via INTRAMUSCULAR
  Filled 2017-02-06: qty 2

## 2017-02-06 MED ORDER — LIDOCAINE 5 % EX PTCH
1.0000 | MEDICATED_PATCH | Freq: Once | CUTANEOUS | Status: DC
  Administered 2017-02-06: 1 via TRANSDERMAL
  Filled 2017-02-06: qty 1

## 2017-02-06 NOTE — Discharge Instructions (Signed)
Expect your soreness to increase over the next 2-3 days. Take it easy, but do not lay around too much as this may make any stiffness worse.  Antiinflammatory medications: Take 500 mg of naproxen every 12 hours or 800 mg of ibuprofen every 8 hours for the next 3 days. Take these medications with food to avoid upset stomach. Choose only one of these medications, do not take them together.  Muscle relaxer: Robaxin is a muscle relaxer and may help loosen stiff muscles. Do not take the Robaxin while driving or performing other dangerous activities.   Vicodin: Use Vicodin for severe pain. Do not drive or perform other dangerous activities while taking the Vicodin.  Lidocaine patches: These are available via either prescription or over-the-counter. The over-the-counter option may be more economical one and are likely just as effective. There are multiple over-the-counter brands, such as Salonpas.  Exercises: Be sure to perform the attached exercises starting with three times a week and working up to performing them daily. This is an essential part of preventing long term problems.   Follow up with a primary care provider for any future management of these complaints.

## 2017-02-06 NOTE — ED Provider Notes (Signed)
[facility] DEPT Provider Note   CSN: [id number] Arrival date & time: 02/06/17  1713  By signing my name below, I, [name], attest that this documentation has been prepared under the direction and in the presence of Shawn Joy, PA-C. Electronically Signed: [name], ED Scribe. 02/06/2017. 6:06 PM.    History   Chief Complaint Chief Complaint  Patient presents with  . Back Pain    The history is provided by the patient. No language interpreter was used.   HPI Comments:  Nicole [name] is a 41 y.o. female who presents to the Emergency Department complaining of acute on chronic constant lower back pain beginning three days ago. Pain is described as both sharp and a tightness, moderate to severe, radiates to her right hip. 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 She has tried exercises at the gym with no improvement. She has also tried one dose of Motrin without relief. She denies neuro deficits, changes in bowel or bladder function, nausea/vomiting, or any other complaints.    Past Medical History:  Diagnosis Date  . Anemia   . Asthma   . Blood transfusion 2011   r/t anemia  . Fibroid   . Migraine   . Migraine   . Seizures [facility])    last sz Jan 2012  . Seizures ([place])   . Sickle cell trait ([place])   . Sickle cell trait ([place])   . Tooth abscess     Patient Active Problem List   Diagnosis Date Noted  . 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 Depression   . Generalized abdominal pain   . Fatigue 10/28/2015  . Adjustment disorder with depressed mood 10/21/2015  . Dizziness   . Weakness 11/18/2014  . OSA (obstructive sleep apnea) 10/21/2014  . Acute rhinosinusitis 10/09/2014  . Leg pain, lateral 09/04/2014  . Migraines 08/13/2014  . Seizure ([place]) 06/04/2014  . Leiomyoma of uterus, unspecified 02/11/2014  . GERD (gastroesophageal reflux disease) 01/28/2014  . Candidiasis of vulva and vagina 01/28/2014   . Unspecified symptom associated with female genital organs 01/28/2014  . Chest pain 01/20/2014  . Acute pericarditis, unspecified 01/20/2014    Past Surgical History:  Procedure Laterality Date  . TUBAL LIGATION Bilateral 2005    OB History    Gravida Para Term Preterm AB Living   3 2 1 1 1 2    SAB TAB Ectopic Multiple Live Births   0 0 1 0 2       Home Medications    Prior to Admission medications   Medication Sig Start Date End Date Taking? Authorizing Provider  acetaminophen (TYLENOL) 500 MG tablet Take 1 tablet (500 mg total) by mouth every 6 (six) hours as needed. 09/27/15   Gloriann Loan, PA-C  albuterol (PROVENTIL HFA;VENTOLIN HFA) 108 (90 BASE) MCG/ACT inhaler Inhale 2 puffs into the lungs every 4 (four) hours as needed for wheezing or shortness of breath (or coughing). 03/11/15   Shelly Bombard, MD  cefdinir (OMNICEF) 300 MG capsule Take 1 capsule (300 mg total) by mouth 2 (two) times daily. 11/03/16   Larene Pickett, PA-C  clonazePAM (KLONOPIN) 0.5 MG tablet Take 1 tablet (0.5 mg total) by mouth 2 (two) times daily. 11/05/16   Montine Circle, PA-C  cyclobenzaprine (FLEXERIL) 10 MG tablet Take 1 tablet (10 mg total) by mouth 3 (three) times daily as needed for muscle spasms. 07/20/16   Caryl Pina  Angelina Pih, PA-C  FLUoxetine (PROZAC) 20 MG capsule Take 1 capsule (20 mg total) by mouth daily. 10/28/15   Veatrice Bourbon, MD  gabapentin (NEURONTIN) 300 MG capsule Take 1 capsule (300 mg total) by mouth 3 (three) times daily. Patient taking differently: Take 300-1,200 mg by mouth 3 (three) times daily. Takes one capsule twice daily and then takes four capsules at bedtime. 01/25/15   Tanna Furry, MD  HYDROcodone-acetaminophen (NORCO/VICODIN) 5-325 MG tablet Take 1 tablet by mouth every 6 (six) hours as needed. 02/06/17   Shawn C Joy, PA-C  ibuprofen (ADVIL,MOTRIN) 800 MG tablet TAKE 1 TABLET BY MOUTH EVERY 8 HOURS AS NEEDED FOR MILD TO MODERATE PAIN 10/03/16   Historical Provider, MD    ibuprofen (ADVIL,MOTRIN) 800 MG tablet Take 1 tablet (800 mg total) by mouth 3 (three) times daily. 02/06/17   Shawn C Joy, PA-C  levETIRAcetam (KEPPRA) 1000 MG tablet TAKE 1 TABLET BY MOUTH TWICE A DAY Patient taking differently: TAKE 1000 MG IN THE MORNING AND 2000 MG AT BEDTIME 03/07/16   Leone Brand, MD  lidocaine (LIDODERM) 5 % Place 1 patch onto the skin daily. Remove & Discard patch within 12 hours or as directed by MD 02/06/17   Lorayne Bender, PA-C  medroxyPROGESTERone (DEPO-PROVERA) 150 MG/ML injection Inject 1 mL (150 mg total) into the muscle every 3 (three) months. Inject on  the 22nd of each month 03/30/16   Shelly Bombard, MD  methocarbamol (ROBAXIN) 500 MG tablet Take 1 tablet (500 mg total) by mouth 2 (two) times daily. 02/06/17   Shawn C Joy, PA-C  SUMAtriptan Succinate (IMITREX PO) Take 1 tablet by mouth as needed (headaches). Pt does not know dosage, and her pharmacy said if she had it filled at their store, it has been so long it has fallen off their record.    Historical Provider, MD  topiramate (TOPAMAX) 25 MG tablet TAKE 3 TABLETS BY MOUTH DAILY Patient taking differently: TAKE 75mg  BY MOUTH TWICE DAILY 03/07/16   Leone Brand, MD    Family History Family History  Problem Relation Age of Onset  . Hypertension Mother   . Diabetes Mother   . Cancer Mother     Colon, brain, 2 other  . Asthma Mother   . Clotting disorder Mother   . Heart disease Mother     64, stents  . Stroke Mother     2012  . Diabetes Father   . Asthma Father   . Clotting disorder Father   . Sickle cell anemia Father   . Heart disease Father     2000, stents  . Hypertension Father   . Stroke Father     2007  . Diabetes Maternal Grandmother   . Asthma Sister   . Asthma Brother   . Asthma Paternal Grandmother   . Anesthesia problems Neg Hx     Social History Social History  Substance Use Topics  . Smoking status: Former Smoker    Quit date: 03/27/2014  . Smokeless tobacco: Never Used   . Alcohol use No     Allergies   Shrimp [shellfish allergy]; Tea; Penicillins; and Sulfa antibiotics   Review of Systems Review of Systems  Gastrointestinal: Negative for abdominal pain, diarrhea, nausea and vomiting.  Genitourinary: Negative for dysuria.  Musculoskeletal: Positive for back pain. Negative for neck pain.  Neurological: Negative for syncope, weakness and numbness.     Physical Exam Updated Vital Signs BP 114/83 (BP Location: Left Arm)  Pulse 85   Temp 98.2 F (36.8 C) (Oral)   Resp 16   Ht 5\' 5"  (1.651 m)   Wt 109.5 kg   SpO2 100%   BMI 40.18 kg/m   Physical Exam  Constitutional: She appears well-developed and well-nourished. No distress.  HENT:  Head: Normocephalic and atraumatic.  Eyes: Conjunctivae are normal.  Neck: Neck supple.  Cardiovascular: Normal rate, regular rhythm and intact distal pulses.   Pulmonary/Chest: Effort normal.  Abdominal: Soft. She exhibits no distension. There is no tenderness.  Musculoskeletal: She exhibits tenderness.  Tenderness to right lumbar musculature extending to right buttocks. Normal motor function in bilateral lower extremities. No midline spinal tenderness.  Following pain management, patient has full range of motion in each cardinal direction of the lower extremities, especially at the hips.  Neurological: She is alert.  No sensory deficits. Strength 5/5 bilaterally in the lower extremities. Antalgic gait, improved with ED pain management.   Skin: Skin is warm and dry. She is not diaphoretic.  Psychiatric: She has a normal mood and affect. Her behavior is normal.  Nursing note and vitals reviewed.    ED Treatments / Results  DIAGNOSTIC STUDIES:  Oxygen Saturation is 100% on RA, normal by my interpretation.    COORDINATION OF CARE:  6:02 PM Discussed treatment plan with pt at bedside and pt agreed to plan.   Labs (all labs ordered are listed, but only abnormal results are displayed) Labs Reviewed -  No data to display  EKG  EKG Interpretation None       Radiology No results found.  Procedures Procedures (including critical care time)  Medications Ordered in ED Medications  ketorolac (TORADOL) injection 60 mg (60 mg Intramuscular Given 02/06/17 1832)  methocarbamol (ROBAXIN) tablet 1,000 mg (1,000 mg Oral Given 02/06/17 1831)     Initial Impression / Assessment and Plan / ED Course  I have reviewed the triage vital signs and the nursing notes.  Pertinent labs & imaging results that were available during my care of the patient were reviewed by me and considered in my medical decision making (see chart for details).     Patient presents with acute on chronic lower back pain. She has no neuro or functional deficits. PCP follow-up and continued management recommended. Home care and return precautions discussed. Patient voices understanding of all instructions and is comfortable discharge.     Final Clinical Impressions(s) / ED Diagnoses   Final diagnoses:  Strain of lumbar region, initial encounter    New Prescriptions Discharge Medication List as of 02/06/2017  6:20 PM    START taking these medications   Details  HYDROcodone-acetaminophen (NORCO/VICODIN) 5-325 MG tablet Take 1 tablet by mouth every 6 (six) hours as needed., Starting Tue 02/06/2017, Print    !! ibuprofen (ADVIL,MOTRIN) 800 MG tablet Take 1 tablet (800 mg total) by mouth 3 (three) times daily., Starting Tue 02/06/2017, Print    lidocaine (LIDODERM) 5 % Place 1 patch onto the skin daily. Remove & Discard patch within 12 hours or as directed by MD, Starting Tue 02/06/2017, Print    methocarbamol (ROBAXIN) 500 MG tablet Take 1 tablet (500 mg total) by mouth 2 (two) times daily., Starting Tue 02/06/2017, Print     !! - Potential duplicate medications found. Please discuss with provider.    I personally performed the services described in this documentation, which was scribed in my presence. The recorded  information has been reviewed and is accurate.    Lorayne Bender, PA-C  02/09/17 [place], MD 02/09/17 1014

## 2017-02-06 NOTE — ED Triage Notes (Signed)
Patient c/o lower back pain that has been constant over 3 days. patient lifts and moves patient's so does heavy lifting at work. Patient denies any n/v/d or urinary problems,

## 2017-03-04 ENCOUNTER — Emergency Department (HOSPITAL_COMMUNITY)
Admission: EM | Admit: 2017-03-04 | Discharge: 2017-03-04 | Disposition: A | Discharge status: Home / Self Care | Payer: 59 | Attending: Emergency Medicine | Admitting: Emergency Medicine

## 2017-03-04 ENCOUNTER — Other Ambulatory Visit: Payer: Self-pay

## 2017-03-04 ENCOUNTER — Emergency Department (HOSPITAL_COMMUNITY): Payer: 59

## 2017-03-04 ENCOUNTER — Encounter (HOSPITAL_COMMUNITY): Payer: Self-pay

## 2017-03-04 DIAGNOSIS — N3000 Acute cystitis without hematuria: Secondary | ICD-10-CM | POA: Insufficient documentation

## 2017-03-04 DIAGNOSIS — R569 Unspecified convulsions: Secondary | ICD-10-CM | POA: Insufficient documentation

## 2017-03-04 DIAGNOSIS — Z87891 Personal history of nicotine dependence: Secondary | ICD-10-CM | POA: Insufficient documentation

## 2017-03-04 DIAGNOSIS — J45909 Unspecified asthma, uncomplicated: Secondary | ICD-10-CM | POA: Insufficient documentation

## 2017-03-04 DIAGNOSIS — M25551 Pain in right hip: Secondary | ICD-10-CM

## 2017-03-04 LAB — URINALYSIS, ROUTINE W REFLEX MICROSCOPIC
Bilirubin Urine: NEGATIVE
GLUCOSE, UA: NEGATIVE mg/dL
HGB URINE DIPSTICK: NEGATIVE
Ketones, ur: NEGATIVE mg/dL
NITRITE: NEGATIVE
PH: 7 (ref 5.0–8.0)
Protein, ur: NEGATIVE mg/dL
SPECIFIC GRAVITY, URINE: 1.009 (ref 1.005–1.030)

## 2017-03-04 LAB — CBC WITH DIFFERENTIAL/PLATELET
BASOS PCT: 1 %
Basophils Absolute: 0 10*3/uL (ref 0.0–0.1)
EOS ABS: 0.3 10*3/uL (ref 0.0–0.7)
EOS PCT: 6 %
HCT: 36.7 % (ref 36.0–46.0)
Hemoglobin: 12.2 g/dL (ref 12.0–15.0)
Lymphocytes Relative: 51 %
Lymphs Abs: 2.5 10*3/uL (ref 0.7–4.0)
MCH: 28.8 pg (ref 26.0–34.0)
MCHC: 33.2 g/dL (ref 30.0–36.0)
MCV: 86.8 fL (ref 78.0–100.0)
MONO ABS: 0.3 10*3/uL (ref 0.1–1.0)
MONOS PCT: 5 %
NEUTROS PCT: 37 %
Neutro Abs: 1.8 10*3/uL (ref 1.7–7.7)
PLATELETS: 276 10*3/uL (ref 150–400)
RBC: 4.23 MIL/uL (ref 3.87–5.11)
RDW: 13 % (ref 11.5–15.5)
WBC: 4.8 10*3/uL (ref 4.0–10.5)

## 2017-03-04 LAB — COMPREHENSIVE METABOLIC PANEL
ALK PHOS: 62 U/L (ref 38–126)
ALT: 14 U/L (ref 14–54)
AST: 16 U/L (ref 15–41)
Albumin: 3.6 g/dL (ref 3.5–5.0)
Anion gap: 9 (ref 5–15)
BUN: 5 mg/dL — AB (ref 6–20)
CALCIUM: 9.1 mg/dL (ref 8.9–10.3)
CO2: 20 mmol/L — ABNORMAL LOW (ref 22–32)
CREATININE: 0.75 mg/dL (ref 0.44–1.00)
Chloride: 111 mmol/L (ref 101–111)
GFR calc non Af Amer: >60 mL/min (ref >60)
Glucose, Bld: 80 mg/dL (ref 65–99)
Potassium: 3.9 mmol/L (ref 3.5–5.1)
Sodium: 140 mmol/L (ref 135–145)
Total Bilirubin: 0.6 mg/dL (ref 0.3–1.2)
Total Protein: 6.7 g/dL (ref 6.5–8.1)

## 2017-03-04 LAB — CBG MONITORING, ED: Glucose-Capillary: 78 mg/dL (ref 65–99)

## 2017-03-04 LAB — I-STAT TROPONIN, ED: Troponin i, poc: 0 ng/mL (ref 0.00–0.08)

## 2017-03-04 MED ORDER — KETOROLAC TROMETHAMINE 30 MG/ML IJ SOLN
30.0000 mg | Freq: Once | INTRAMUSCULAR | Status: AC
  Administered 2017-03-04: 30 mg via INTRAVENOUS
  Filled 2017-03-04: qty 1

## 2017-03-04 MED ORDER — NITROFURANTOIN MONOHYD MACRO 100 MG PO CAPS: 100.0000 mg | ORAL_CAPSULE | Freq: Two times a day (BID) | ORAL | 0 refills | Status: DC

## 2017-03-04 MED ORDER — SODIUM CHLORIDE 0.9 % IV SOLN
1000.0000 mg | Freq: Once | INTRAVENOUS | Status: AC
  Administered 2017-03-04: 1000 mg via INTRAVENOUS
  Filled 2017-03-04: qty 10

## 2017-03-04 NOTE — ED Triage Notes (Signed)
Pt arrives EMS after seizures x 3 with c/o pain at right hip. Previous hx of hip dislocation. Fentanyl 181mcg PTA.

## 2017-03-04 NOTE — ED Notes (Signed)
ED Provider at bedside. 

## 2017-03-04 NOTE — ED Notes (Signed)
Pt states she understands instructions. Home stable with steady gait and family.

## 2017-03-04 NOTE — ED Notes (Signed)
Patient transported to X-ray 

## 2017-03-04 NOTE — ED Provider Notes (Signed)
Emergency Department Provider Note   I have reviewed the triage vital signs and the nursing notes.   HISTORY  Chief Complaint Seizures and Hip Pain   HPI Nicole [name] is a 41 y.o. female with PMH of seizure disorder, migraine HA, and asthma presents to the [facility] for evaluation of right hip pain and breakthrough seizure. Patient states that her husband told her she had 3 breakthrough seizures today. Her last seizure was late last year. She reports being compliant with her Keppra, Topamax, gabapentin. She has had upper respiratory tract infection symptoms over the last week but no significant fever or chills. She has had some body aches and mild sore throat. The patient was having hip pain prior to her seizure. She states that she's had intermittent pain in the right hip before at this time seem more severe. No back pain. No weakness or numbness. No head injury or trauma.   Past Medical History:  Diagnosis Date  . Anemia   . Asthma   . Blood transfusion 2011   r/t anemia  . Fibroid   . Migraine   . Migraine   . Seizures [facility])    last sz Jan 2012  . Seizures ([place])   . Sickle cell trait ([place])   . Sickle cell trait ([place])   . Tooth abscess     Patient Active Problem List   Diagnosis Date Noted  . 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 Depression   . Generalized abdominal pain   . Fatigue 10/28/2015  . Adjustment disorder with depressed mood 10/21/2015  . Dizziness   . Weakness 11/18/2014  . OSA (obstructive sleep apnea) 10/21/2014  . Acute rhinosinusitis 10/09/2014  . Leg pain, lateral 09/04/2014  . Migraines 08/13/2014  . Seizure ([place]) 06/04/2014  . Leiomyoma of uterus, unspecified 02/11/2014  . GERD (gastroesophageal reflux disease) 01/28/2014  . Candidiasis of vulva and vagina 01/28/2014  . Unspecified symptom associated with female genital organs 01/28/2014  . Chest pain 01/20/2014  . Acute pericarditis, unspecified 01/20/2014    Past Surgical History:    Procedure Laterality Date  . TUBAL LIGATION Bilateral 2005    Current Outpatient Rx  . Order #: [id number] Class: Print  . Order #: [id number] Class: Normal  . Order #: [id number] Class: Print  . Order #: [id number] Class: Print  . Order #: [id number] Class: Print  . Order #: [id number] Class: Print  . Order #: [id number] Class: Print  . Order #: [id number] Class: Historical Med  . Order #: [id number] Class: Print  . Order #: [id number] Class: Normal  . Order #: [id number] Class: Print  . Order #: [id number] Class: Normal  . Order #: [id number] Class: Print  . Order #: [id number] Class: Normal  . Order #: [id number] Class: Print  . Order #: [id number] Class: Print  . Order #: [id number] Class: Historical Med    Allergies Shrimp [shellfish allergy]; Tea; Penicillins; and Sulfa antibiotics  Family History  Problem Relation Age of Onset  . Hypertension Mother   . Diabetes Mother   . Cancer Mother     Colon, brain, 2 other  . Asthma Mother   . Clotting disorder Mother   . Heart disease Mother     10, stents  . Stroke Mother     2012  . Diabetes Father   . Asthma Father   . Clotting disorder Father   . Sickle cell anemia Father   . Heart disease Father     2000, stents  . Hypertension Father   . Stroke Father     2007  .  Diabetes Maternal Grandmother   . Asthma Sister   . Asthma Brother   . Asthma Paternal Grandmother   . Anesthesia problems Neg Hx     Social History Social History  Substance Use Topics  . Smoking status: Former Smoker    Quit date: 03/27/2014  . Smokeless tobacco: Never Used  . Alcohol use No    Review of Systems  Constitutional: No fever/chills Eyes: No visual changes. ENT: No sore throat. Cardiovascular: Denies chest pain. Respiratory: Denies shortness of breath. Gastrointestinal: No abdominal pain.  No nausea, no vomiting.  No diarrhea.  No constipation. Genitourinary: Negative for dysuria. Musculoskeletal: Negative for back pain. Positive right hip pain and  knee pain.  Skin: Negative for rash. Neurological: Negative for headaches, focal weakness or numbness. Positive breakthrough seizure.   10-point ROS otherwise negative.  ____________________________________________   PHYSICAL EXAM:  VITAL SIGNS: ED Triage Vitals  Enc Vitals Group     BP 03/04/17 1300 108/83     Pulse Rate 03/04/17 1245 71     Resp 03/04/17 1245 11     Temp --      Temp src --      SpO2 03/04/17 1245 95 %     Weight 03/04/17 1229 240 lb (108.9 kg)     Height 03/04/17 1229 5\' 5"  (1.651 m)     Pain Score 03/04/17 1229 2   Constitutional: Alert and oriented. Well appearing and in no acute distress. Eyes: Conjunctivae are normal. PERRL. Head: Atraumatic. Nose: No congestion/rhinnorhea. Mouth/Throat: Mucous membranes are moist.   Neck: No stridor.   Cardiovascular: Normal rate, regular rhythm. Good peripheral circulation. Grossly normal heart sounds.   Respiratory: Normal respiratory effort.  No retractions. Lungs CTAB. Gastrointestinal: Soft and nontender. No distention.  Musculoskeletal: Mild right hip pain with ROM. Mild right knee pain. No deformity. No gross deformities of extremities. Neurologic:  Normal speech and language. No gross focal neurologic deficits are appreciated.  Skin:  Skin is warm, dry and intact. No rash noted.  ____________________________________________   LABS (all labs ordered are listed, but only abnormal results are displayed)  Labs Reviewed  COMPREHENSIVE METABOLIC PANEL - Abnormal; Notable for the following:       Result Value   CO2 20 (*)    BUN 5 (*)    All other components within normal limits  URINALYSIS, ROUTINE W REFLEX MICROSCOPIC - Abnormal; Notable for the following:    APPearance HAZY (*)    Leukocytes, UA LARGE (*)    Bacteria, UA RARE (*)    Squamous Epithelial / LPF 0-5 (*)    All other components within normal limits  CBC WITH DIFFERENTIAL/PLATELET  CBG MONITORING, ED  I-STAT BETA HCG BLOOD, ED ([place], [facility], [facility]  ONLY)  I-STAT TROPOININ, ED   ____________________________________________  EKG   EKG Interpretation  Date/Time:  Sunday March 04 2017 12:34:58 EDT Ventricular Rate:  76 PR Interval:    QRS Duration: 84 QT Interval:  383 QTC Calculation: 431 R Axis:   57 Text Interpretation:  Sinus rhythm No STEMI.  Confirmed by Erma Joubert MD, Bindi Klomp [phone]) on 03/04/2017 1:20:31 PM       ____________________________________________  RADIOLOGY  Dg Knee 2 Views Right  Result Date: 03/04/2017 CLINICAL DATA:  Patient status post seizure. Knee pain. Initial encounter. EXAM: RIGHT KNEE - 1-2 VIEW COMPARISON:  None. FINDINGS: Normal anatomic alignment. No evidence for acute fracture or dislocation. Regional soft tissues are unremarkable. IMPRESSION: No acute osseus abnormality. Electronically Signed  By: Lovey Newcomer M.D.   On: 03/04/2017 14:15   Dg Hip Unilat W Or Wo Pelvis 2-3 Views Right  Result Date: 03/04/2017 CLINICAL DATA:  Seizures and complains of pain in the right hip and knee. EXAM: DG HIP (WITH OR WITHOUT PELVIS) 2-3V RIGHT COMPARISON:  None. FINDINGS: Multiple phleboliths in the pelvis. The pelvic bony ring is intact. SI joints are within normal limits. Right hip is located without a fracture. No significant joint space narrowing in either hip. Mild sclerosis and degenerative changes at the pubic symphysis. IMPRESSION: No acute abnormality to the pelvis or right hip. Electronically Signed   By: Markus Daft M.D.   On: 03/04/2017 14:17    ____________________________________________   PROCEDURES  Procedure(s) performed:   Procedures  None ____________________________________________   INITIAL IMPRESSION / ASSESSMENT AND PLAN / ED COURSE  Pertinent labs & imaging results that were available during my care of the patient were reviewed by me and considered in my medical decision making (see chart for details).  Patient presents to the emergency department for evaluation of breakthrough  seizure today with right hip pain. Patient has full range of motion of the right hip with some mild discomfort. She has some tenderness of the right knee as well. No fevers or chills. The patient is somewhat drowsy and seems postictal but is able to provide a full history. Plan for plain films of the right hip and right knee. We'll obtain baseline labs and reassess. Patient had a viral illness this past week which may have lowered seizure threshold.  Labs are remarkable for UTI. Plan to treat with Macrobid. Loaded with IV Keppra with history of non-compliance. Patient is ambulatory at discharge.   At this time, I do not feel there is any life-threatening condition present. I have reviewed and discussed all results (EKG, imaging, lab, urine as appropriate), exam findings with patient. I have reviewed nursing notes and appropriate previous records.  I feel the patient is safe to be discharged home without further emergent workup. Discussed usual and customary return precautions. Patient and family (if present) verbalize understanding and are comfortable with this plan.  Patient will follow-up with their primary care provider. If they do not have a primary care provider, information for follow-up has been provided to them. All questions have been answered.  ____________________________________________  FINAL CLINICAL IMPRESSION(S) / ED DIAGNOSES  Final diagnoses:  Seizure ([place])  Acute cystitis without hematuria  Right hip pain     MEDICATIONS GIVEN DURING THIS VISIT:  Medications  ketorolac (TORADOL) 30 MG/ML injection 30 mg (30 mg Intravenous Given 03/04/17 1309)  levETIRAcetam (KEPPRA) 1,000 mg in sodium chloride 0.9 % 100 mL IVPB (0 mg Intravenous Stopped 03/04/17 1614)     NEW OUTPATIENT MEDICATIONS STARTED DURING THIS VISIT:  Discharge Medication List as of 03/04/2017  3:38 PM    START taking these medications   Details  nitrofurantoin, macrocrystal-monohydrate, (MACROBID) 100 MG  capsule Take 1 capsule (100 mg total) by mouth 2 (two) times daily., Starting Sun 03/04/2017, Print        Note:  This document was prepared using Dragon voice recognition software and may include unintentional dictation errors.  Nanda Quinton, MD Emergency Medicine   Margette Fast, MD 03/04/17 [phone]

## 2017-03-04 NOTE — Discharge Instructions (Signed)

## 2017-03-18 IMAGING — MR MR LUMBAR SPINE WO CONTRAST
4 of 5 series · 19 of 48 positions shown · non-contrast
Comparison: Lumbar radiographs 06/09/2016

CLINICAL DATA: Low back pain with right leg pain

EXAM:
MRI LUMBAR SPINE WITHOUT CONTRAST
TECHNIQUE: Multiplanar, multisequence MR imaging of the lumbar spine was
performed. No intravenous contrast was administered.

[Series 4: T2 · sagittal · 4.0mm · 0.55mm/px · 6 of 12 slices shown (1 of 2)]
[im 1/12]
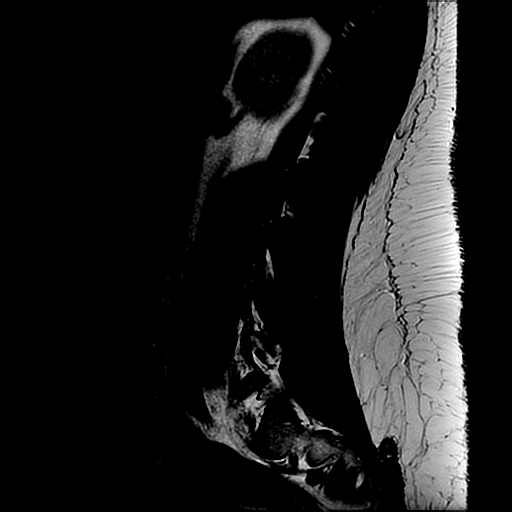
[im 3/12]
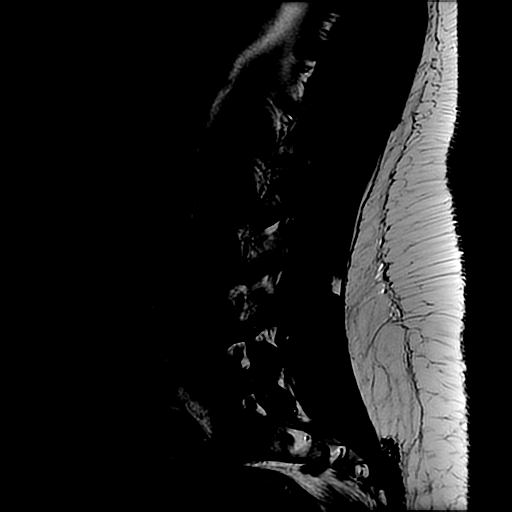
[im 5/12]
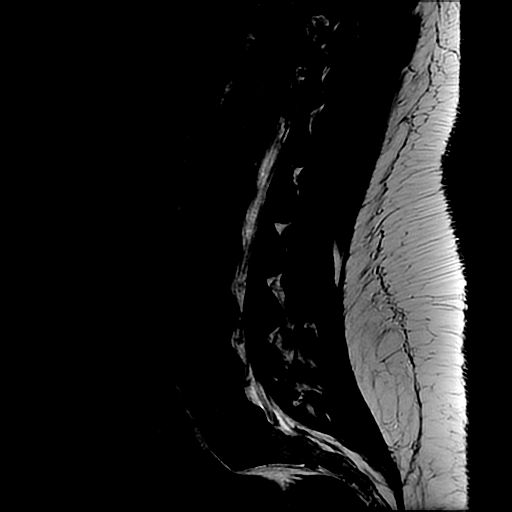
[im 7/12]
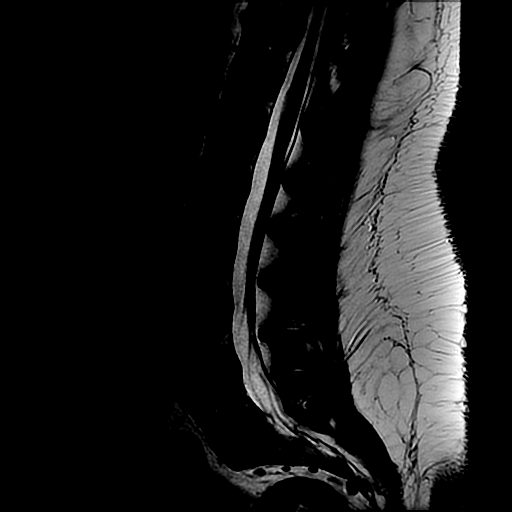
[im 9/12]
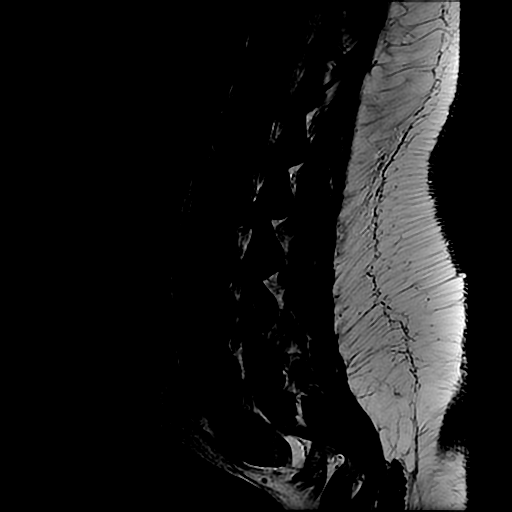
[im 12/12]
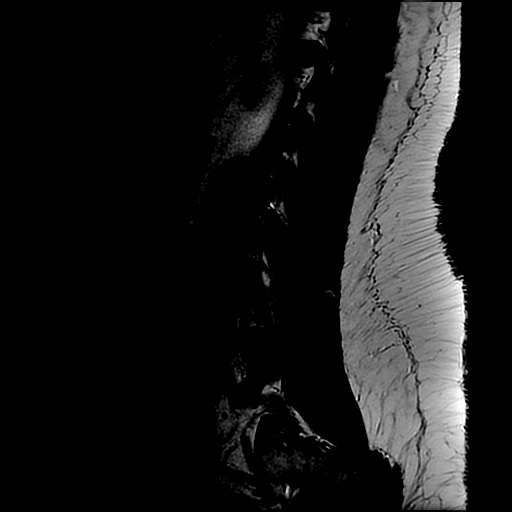

[Series 5: T1 · sagittal · 4.0mm · 0.55mm/px · 3 of 12 slices shown (1 of 2)]
[im 3/12]
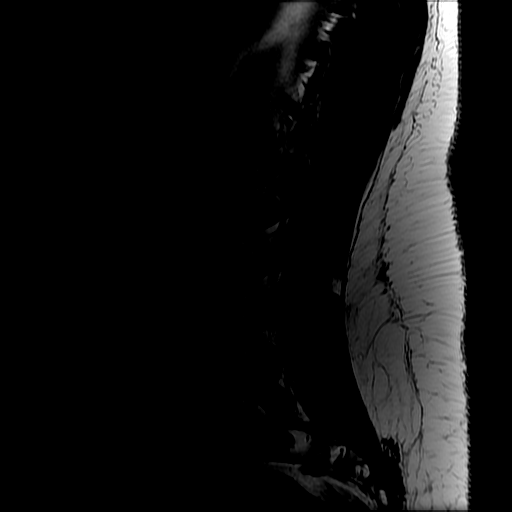
[im 7/12]
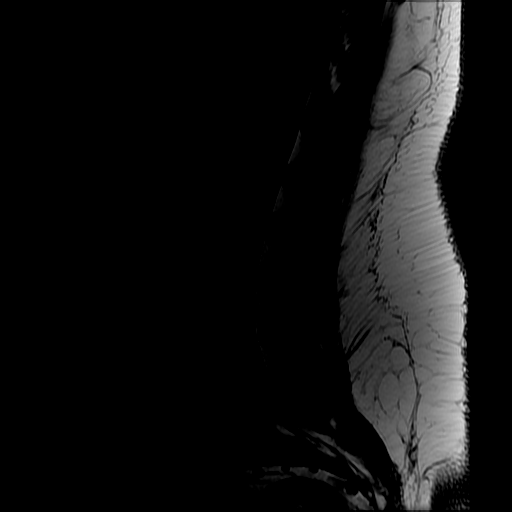
[im 12/12]
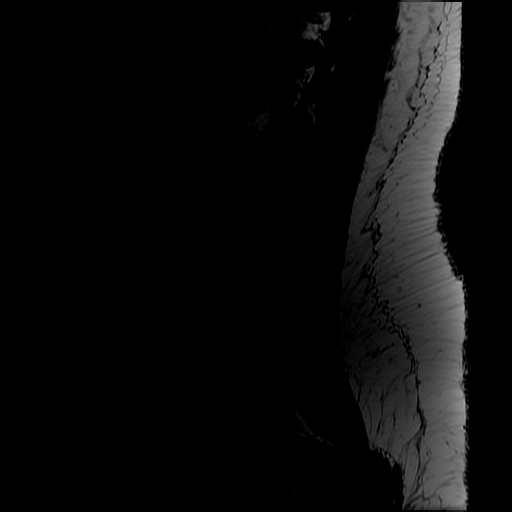

[Series 7: T2 · axial · 4.0mm · 0.39mm/px · z∈[-31,+136]mm · 7 of 32 slices shown (2 of 2)]
[im 1/32]
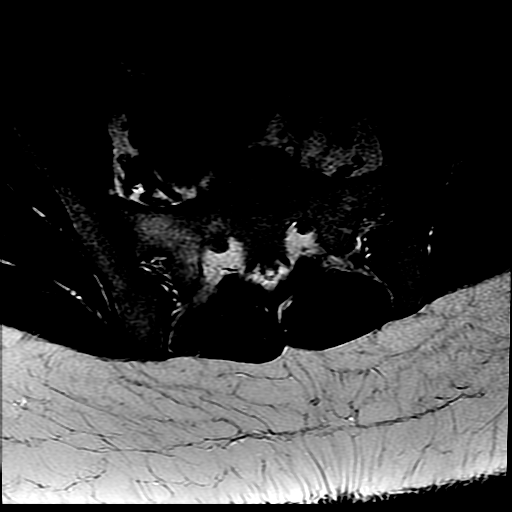
[im 5/32]
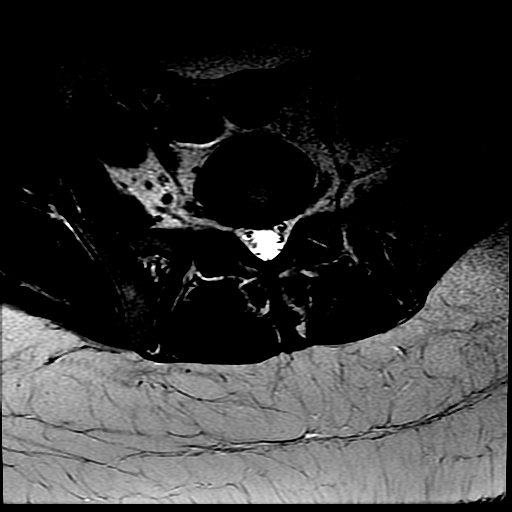
[im 9/32]
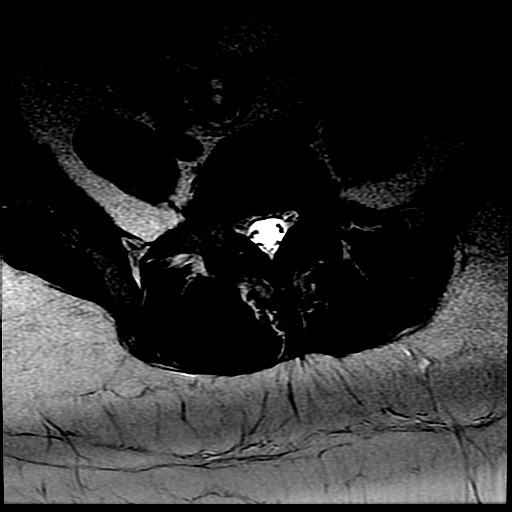
[im 14/32]
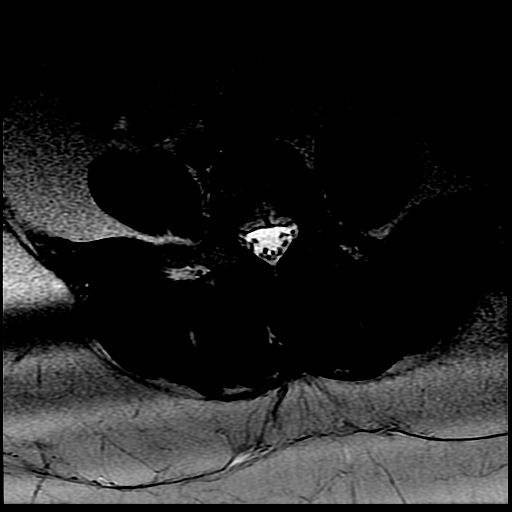
[im 16/32]
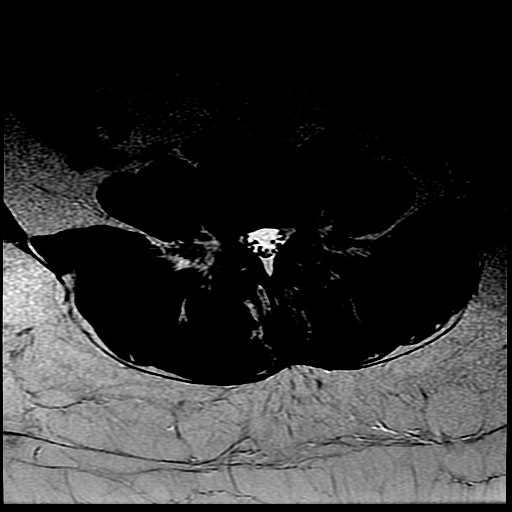
[im 18/32]
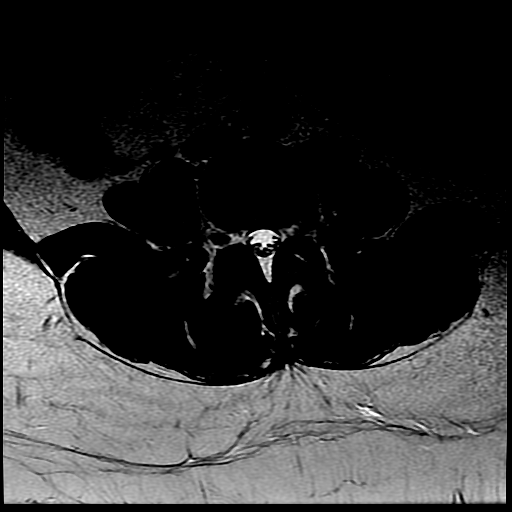
[im 27/32]
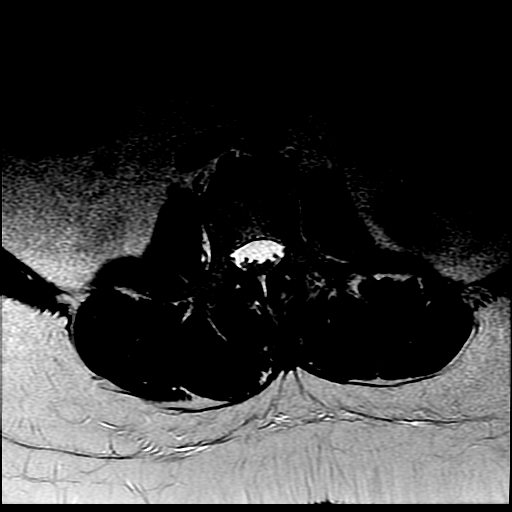

[Series 8: T1 · axial · 4.0mm · 0.39mm/px · z∈[-12,+136]mm · 3 of 32 slices shown (2 of 2)]
[im 5/32]
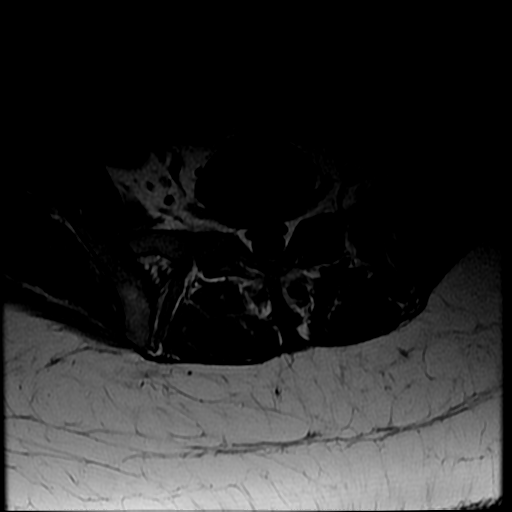
[im 16/32]
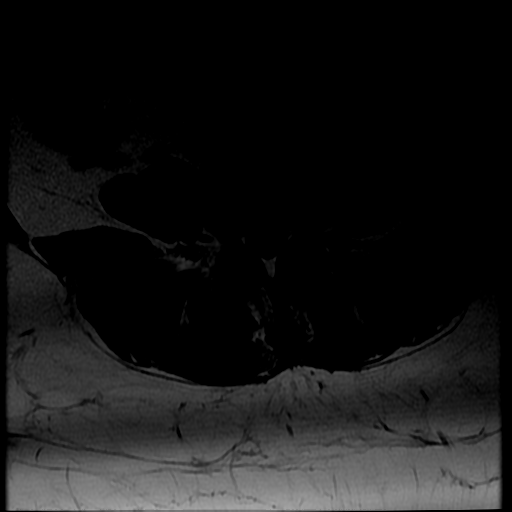
[im 27/32]
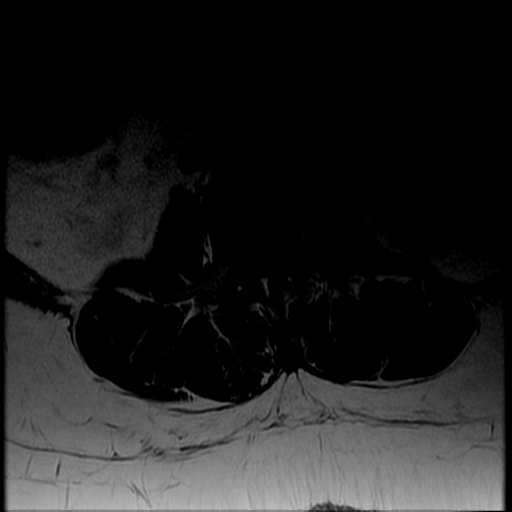

[19 of 48 positions shown; findings below may reference images not displayed]

FINDINGS: Segmentation:  Normal segmentation.  Lowest disc space L5-S1.

Alignment:  Normal alignment.  Mild dextroscoliosis.

Vertebrae:  Negative for fracture or mass.  Normal bone marrow.

Conus medullaris: Extends to the mid L1 Level and appears normal.

Paraspinal and other soft tissues: Paraspinous muscles normal. No
retroperitoneal mass or adenopathy.

Disc levels:

L1-2:  Negative.

L2-3:  Negative

L3-4: Small left foraminal and extra foraminal disc protrusion .
Mild facet degeneration. Mild spinal stenosis. Right foramen is
patent.

L4-5: Negative

L5-S1: Negative
IMPRESSION: Small left-sided disc protrusion L3-4 extending lateral to the
foramen. Mild spinal stenosis L3-4.

Otherwise  negative

## 2017-05-11 ENCOUNTER — Emergency Department (HOSPITAL_COMMUNITY)
Admission: EM | Admit: 2017-05-11 | Discharge: 2017-05-11 | Disposition: A | Discharge status: Home / Self Care | Payer: 59 | Attending: Emergency Medicine | Admitting: Emergency Medicine

## 2017-05-11 ENCOUNTER — Emergency Department (HOSPITAL_COMMUNITY): Payer: 59

## 2017-05-11 ENCOUNTER — Encounter (HOSPITAL_COMMUNITY): Payer: Self-pay | Admitting: Emergency Medicine

## 2017-05-11 DIAGNOSIS — R0602 Shortness of breath: Secondary | ICD-10-CM | POA: Insufficient documentation

## 2017-05-11 DIAGNOSIS — Z87891 Personal history of nicotine dependence: Secondary | ICD-10-CM | POA: Insufficient documentation

## 2017-05-11 DIAGNOSIS — Z79899 Other long term (current) drug therapy: Secondary | ICD-10-CM | POA: Insufficient documentation

## 2017-05-11 DIAGNOSIS — R110 Nausea: Secondary | ICD-10-CM | POA: Insufficient documentation

## 2017-05-11 DIAGNOSIS — R0789 Other chest pain: Secondary | ICD-10-CM | POA: Insufficient documentation

## 2017-05-11 DIAGNOSIS — J45909 Unspecified asthma, uncomplicated: Secondary | ICD-10-CM | POA: Insufficient documentation

## 2017-05-11 DIAGNOSIS — R51 Headache: Secondary | ICD-10-CM | POA: Insufficient documentation

## 2017-05-11 MED ORDER — MELOXICAM 7.5 MG PO TABS: 7.5000 mg | ORAL_TABLET | Freq: Two times a day (BID) | ORAL | 0 refills | Status: DC

## 2017-05-11 MED ORDER — CYCLOBENZAPRINE HCL 10 MG PO TABS: 10.0000 mg | ORAL_TABLET | Freq: Every day | ORAL | 0 refills | Status: DC

## 2017-05-11 NOTE — ED Provider Notes (Signed)
[facility] DEPT Provider Note   CSN: [id number] Arrival date & time: 05/11/17  1042  By signing my name below, I, Mayer Masker, attest that this documentation has been prepared under the direction and in the presence of [facility], PA-C. Electronically Signed: Mayer Masker, Scribe. 05/11/17. 11:34 AM.  History   Chief Complaint Chief Complaint  Patient presents with  . Chest Pain  . Back Pain    mid to lower  . Nasal Congestion   The history is provided by the patient. No language interpreter was used.    HPI Comments: Nicole [name] is a 41 y.o. female with PMHx of pericarditis  who presents to the Emergency Department complaining of constant, gradually worsening CP for 3 days. She notes the pain wraps around and radiates to her thoracic back. She describes the pain as a throbbing/burning. She has associated SOB,frontal HA, and nausea. She states her symptoms worsen when she breathes deeply or lies down on her back or on her left side. She states she was working out doing chest presses outside when the symptoms began shortly therafter. She denies fever, chills, vomiting, cough, abdominal pain, diarrhea, bloody stools, hematuria, sinus pressure/pain, heart palpitations, leg swelling, and sore throat. Pt also denies smoking, prior cardiac stress test, and recent travels or surgeries.  She does note her mother has had an MI before. Pt is on depo progesterone.  She notes she has chronic and unchanged nasal congestion and environmental allergies, but this is not of concern for her today. She has tried zyrtec, allegra, and nasal sprays for these issues with no relief. This issue is managed by her primary care.  Past Medical History:  Diagnosis Date  . Anemia   . Asthma   . Blood transfusion 2011   r/t anemia  . Fibroid   . Migraine   . Migraine   . Seizures [facility])    last sz Jan 2012  . Seizures ([place])   . Sickle cell trait ([place])   . Sickle cell trait ([place])   . Tooth abscess      Patient Active Problem List   Diagnosis Date Noted  . 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 Depression   . Generalized abdominal pain   . Fatigue 10/28/2015  . Adjustment disorder with depressed mood 10/21/2015  . Dizziness   . Weakness 11/18/2014  . OSA (obstructive sleep apnea) 10/21/2014  . Acute rhinosinusitis 10/09/2014  . Leg pain, lateral 09/04/2014  . Migraines 08/13/2014  . Seizure ([place]) 06/04/2014  . Leiomyoma of uterus, unspecified 02/11/2014  . GERD (gastroesophageal reflux disease) 01/28/2014  . Candidiasis of vulva and vagina 01/28/2014  . Unspecified symptom associated with female genital organs 01/28/2014  . Chest pain 01/20/2014  . Acute pericarditis, unspecified 01/20/2014    Past Surgical History:  Procedure Laterality Date  . TUBAL LIGATION Bilateral 2005    OB History    Gravida Para Term Preterm AB Living   3 2 1 1 1 2    SAB TAB Ectopic Multiple Live Births   0 0 1 0 2       Home Medications    Prior to Admission medications   Medication Sig Start Date End Date Taking? Authorizing Provider  acetaminophen (TYLENOL) 500 MG tablet Take 1 tablet (500 mg total) by mouth every 6 (six) hours as needed. 09/27/15   Gloriann Loan, PA-C  albuterol (PROVENTIL HFA;VENTOLIN HFA) 108 (90 BASE) MCG/ACT inhaler Inhale 2 puffs into the lungs every 4 (four) hours as needed for  wheezing or shortness of breath (or coughing). 03/11/15   Shelly Bombard, MD  cefdinir (OMNICEF) 300 MG capsule Take 1 capsule (300 mg total) by mouth 2 (two) times daily. Patient not taking: Reported on 03/04/2017 11/03/16   Larene Pickett, PA-C  clonazePAM (KLONOPIN) 0.5 MG tablet Take 1 tablet (0.5 mg total) by mouth 2 (two) times daily. 11/05/16   Montine Circle, PA-C  cyclobenzaprine (FLEXERIL) 10 MG tablet Take 1 tablet (10 mg total) by mouth at bedtime. 05/11/17   Chontel Warning A, PA-C  FLUoxetine (PROZAC) 20 MG capsule Take 1 capsule (20 mg total) by mouth daily. 10/28/15   Haney, Amedeo Plenty, MD   gabapentin (NEURONTIN) 300 MG capsule Take 1 capsule (300 mg total) by mouth 3 (three) times daily. Patient taking differently: Take 300-1,200 mg by mouth 3 (three) times daily. Takes one capsule twice daily and then takes four capsules at bedtime. 01/25/15   Tanna Furry, MD  HYDROcodone-acetaminophen (NORCO/VICODIN) 5-325 MG tablet Take 1 tablet by mouth every 6 (six) hours as needed. 02/06/17   Joy, Shawn C, PA-C  ibuprofen (ADVIL,MOTRIN) 800 MG tablet TAKE 1 TABLET BY MOUTH EVERY 8 HOURS AS NEEDED FOR MILD TO MODERATE PAIN 10/03/16   [provider]  ibuprofen (ADVIL,MOTRIN) 800 MG tablet Take 1 tablet (800 mg total) by mouth 3 (three) times daily. 02/06/17   Joy, Shawn C, PA-C  levETIRAcetam (KEPPRA) 1000 MG tablet TAKE 1 TABLET BY MOUTH TWICE A DAY Patient taking differently: TAKE 1000 MG IN THE MORNING AND 2000 MG AT BEDTIME 03/07/16   Leone Brand, MD  lidocaine (LIDODERM) 5 % Place 1 patch onto the skin daily. Remove & Discard patch within 12 hours or as directed by MD 02/06/17   Joy, Shawn C, PA-C  medroxyPROGESTERone (DEPO-PROVERA) 150 MG/ML injection Inject 1 mL (150 mg total) into the muscle every 3 (three) months. Inject on  the 22nd of each month 03/30/16   Shelly Bombard, MD  meloxicam (MOBIC) 7.5 MG tablet Take 1 tablet (7.5 mg total) by mouth 2 (two) times daily. 05/11/17   Nkosi Cortright A, PA-C  methocarbamol (ROBAXIN) 500 MG tablet Take 1 tablet (500 mg total) by mouth 2 (two) times daily. 02/06/17   Joy, Shawn C, PA-C  nitrofurantoin, macrocrystal-monohydrate, (MACROBID) 100 MG capsule Take 1 capsule (100 mg total) by mouth 2 (two) times daily. 03/04/17   Long, Wonda Olds, MD  SUMAtriptan Succinate (IMITREX PO) Take 1 tablet by mouth as needed (headaches). Pt does not know dosage, and her pharmacy said if she had it filled at their store, it has been so long it has fallen off their record.    [provider]  topiramate (TOPAMAX) 25 MG tablet TAKE 3 TABLETS BY MOUTH  DAILY Patient taking differently: TAKE 75mg  BY MOUTH TWICE DAILY 03/07/16   Leone Brand, MD    Family History Family History  Problem Relation Age of Onset  . Hypertension Mother   . Diabetes Mother   . Cancer Mother        Colon, brain, 2 other  . Asthma Mother   . Clotting disorder Mother   . Heart disease Mother        81, stents  . Stroke Mother        2012  . Diabetes Father   . Asthma Father   . Clotting disorder Father   . Sickle cell anemia Father   . Heart disease Father  2000, stents  . Hypertension Father   . Stroke Father        2007  . Diabetes Maternal Grandmother   . Asthma Sister   . Asthma Brother   . Asthma Paternal Grandmother   . Anesthesia problems Neg Hx     Social History Social History  Substance Use Topics  . Smoking status: Former Smoker    Quit date: 03/27/2014  . Smokeless tobacco: Never Used  . Alcohol use No     Allergies   Shrimp [shellfish allergy]; Tea; Penicillins; and Sulfa antibiotics   Review of Systems Review of Systems  Constitutional: Negative for chills and fever.  HENT: Positive for congestion. Negative for sinus pain, sinus pressure and sore throat.   Respiratory: Positive for shortness of breath. Negative for cough.   Cardiovascular: Positive for chest pain. Negative for palpitations and leg swelling.  Gastrointestinal: Positive for nausea. Negative for abdominal pain, blood in stool, diarrhea and vomiting.  Genitourinary: Negative for hematuria.  Allergic/Immunologic: Positive for environmental allergies.  Neurological: Positive for headaches.  All other systems reviewed and are negative.    Physical Exam Updated Vital Signs BP 120/82 (BP Location: Left Arm)   Pulse 80   Temp 98.4 F (36.9 C) (Oral)   Resp 16   Ht 5\' 5"  (1.651 m)   SpO2 100%   Physical Exam  Constitutional: She is oriented to person, place, and time. She appears well-developed and well-nourished.  HENT:  Head:  Normocephalic and atraumatic.  TM's with mid ear effusion bilaterally No maxillary or frontal sinus TTP Nasal septum is midline with pale, pink boggy mucosa and clear drainage with nasal congestion noted Posterior oropharynx clear with postnasal drip  Eyes: Conjunctivae are normal.  Neck: Normal range of motion. Neck supple. No JVD present. No tracheal deviation present.  Cardiovascular: Normal rate, regular rhythm, normal heart sounds and intact distal pulses.   2+ radial and DP/PT pulses, negative Homan's bilaterally   Pulmonary/Chest: Effort normal and breath sounds normal. No respiratory distress. She has no wheezes. She exhibits tenderness.  Diffuse anterior and posterior chest wall TTP No deformity or crepitus or paradoxical motion of the chest wall  Equal rise and fall of chest bilaterally    Musculoskeletal: Normal range of motion. She exhibits tenderness.  No CSP or LSP midline TTP or paraspinal muscle tenderness. There is midline TSP TTP with bilateral paraspinal tenderness. No deformity, crepitus, or step off noted. Normal ROM of BUE, with chest wall pain on upward motion bilaterally. 5/5 strength of BUE with good grip strength.   Lymphadenopathy:    She has no cervical adenopathy.  Neurological: She is alert and oriented to person, place, and time.  Skin: Skin is warm and dry.  Psychiatric: She has a normal mood and affect.  Nursing note and vitals reviewed.    ED Treatments / Results  DIAGNOSTIC STUDIES: Oxygen Saturation is 100% on RA, normal by my interpretation.    COORDINATION OF CARE: 11:20 AM Discussed treatment plan with pt at bedside and pt agreed to plan.  Labs (all labs ordered are listed, but only abnormal results are displayed) Labs Reviewed - No data to display  EKG  EKG Interpretation None       Radiology Dg Chest 2 View  Result Date: 05/11/2017 CLINICAL DATA:  Mid chest pain through to back for 3 days, some shortness of breath, history asthma,  sickle trait EXAM: CHEST  2 VIEW COMPARISON:  09/26/2015 FINDINGS: Normal heart size, mediastinal contours,  and pulmonary vascularity. Lungs clear. No pleural effusion or pneumothorax. Bones unremarkable. IMPRESSION: No acute abnormalities. Electronically Signed   By: Lavonia Dana M.D.   On: 05/11/2017 11:54    Procedures Procedures (including critical care time)  Medications Ordered in ED Medications - No data to display   Initial Impression / Assessment and Plan / ED Course  I have reviewed the triage vital signs and the nursing notes.  Pertinent labs & imaging results that were available during my care of the patient were reviewed by me and considered in my medical decision making (see chart for details).     Patient with chest pain and thoracic pain reproducible on palpation. Afebrile, vital signs stable. EKG shows normal sinus rhythm. Low suspicion of ACS/MI or. Carditis. Chest x-ray shows No acute cardiopulmonary abnormality. Low suspicion of PE, pneumonia, bronchitis. Pain is likely muscular skeletal nature, possibly costochondritis secondary to exercise. No focal neurologic deficits. Discussed treatment with anti-inflammatories, Tylenol, ice, heat and gentle stretching. She takes Mobic due to her reflux. Given Rx for Flexeril to take at night for sleep, discussed proper use of this medication and advised patient not to drive, drink alcohol, or operate heavy machinery if this medication makes her drowsy. Discussed indications for return to the ED. She will follow-up with primary care in 1 week for reevaluation and management of her nasal congestion and seasonal allergies. Pt verbalized understanding of and agreement with plan and is safe for discharge home at this time.   Final Clinical Impressions(s) / ED Diagnoses   Final diagnoses:  Chest wall pain    New Prescriptions New Prescriptions   CYCLOBENZAPRINE (FLEXERIL) 10 MG TABLET    Take 1 tablet (10 mg total) by mouth at bedtime.    MELOXICAM (MOBIC) 7.5 MG TABLET    Take 1 tablet (7.5 mg total) by mouth 2 (two) times daily.  *I personally performed the services described in this documentation, which was scribed in my presence. The recorded information has been reviewed and is accurate.     Renita Papa, PA-C 05/11/17 1210    Veryl Speak, MD 05/11/17 [phone]

## 2017-05-11 NOTE — ED Notes (Signed)
Bed: WTR7 Expected date:  Expected time:  Means of arrival:  Comments: 

## 2017-05-11 NOTE — ED Notes (Signed)
Patient transported to X-ray 

## 2017-05-11 NOTE — ED Triage Notes (Signed)
Patient c/o chest and mid to lower back pain x 3 days after working out. Patient reports pain is worse with movement and when lying down at night. Patient also has congestion

## 2017-05-11 NOTE — Discharge Instructions (Signed)
Take Mobic twice daily and take Tylenol in between. Apply ice or heat to the affected areas for comfort. Do some gentle stretching exercises in the shower. Follow-up with your primary care for reevaluation of your nasal congestion and chest wall pain. Return to the ED if any concerning symptoms develop.

## 2017-06-09 ENCOUNTER — Emergency Department (HOSPITAL_COMMUNITY)
Admission: EM | Admit: 2017-06-09 | Discharge: 2017-06-09 | Disposition: A | Discharge status: Home / Self Care | Payer: 59 | Attending: Emergency Medicine | Admitting: Emergency Medicine

## 2017-06-09 ENCOUNTER — Emergency Department (HOSPITAL_COMMUNITY): Payer: 59

## 2017-06-09 DIAGNOSIS — I300 Acute nonspecific idiopathic pericarditis: Secondary | ICD-10-CM | POA: Insufficient documentation

## 2017-06-09 DIAGNOSIS — J45909 Unspecified asthma, uncomplicated: Secondary | ICD-10-CM | POA: Insufficient documentation

## 2017-06-09 DIAGNOSIS — Z79899 Other long term (current) drug therapy: Secondary | ICD-10-CM | POA: Insufficient documentation

## 2017-06-09 DIAGNOSIS — Z87891 Personal history of nicotine dependence: Secondary | ICD-10-CM | POA: Insufficient documentation

## 2017-06-09 LAB — BASIC METABOLIC PANEL
Anion gap: 6 (ref 5–15)
BUN: 8 mg/dL (ref 6–20)
CHLORIDE: 111 mmol/L (ref 101–111)
CO2: 21 mmol/L — ABNORMAL LOW (ref 22–32)
Calcium: 8.7 mg/dL — ABNORMAL LOW (ref 8.9–10.3)
Creatinine, Ser: 0.8 mg/dL (ref 0.44–1.00)
GFR calc Af Amer: >60 mL/min (ref >60)
Glucose, Bld: 95 mg/dL (ref 65–99)
POTASSIUM: 3.8 mmol/L (ref 3.5–5.1)
SODIUM: 138 mmol/L (ref 135–145)

## 2017-06-09 LAB — CBC
HEMATOCRIT: 36.1 % (ref 36.0–46.0)
HEMOGLOBIN: 12.6 g/dL (ref 12.0–15.0)
MCH: 29.4 pg (ref 26.0–34.0)
MCHC: 34.9 g/dL (ref 30.0–36.0)
MCV: 84.1 fL (ref 78.0–100.0)
Platelets: 273 10*3/uL (ref 150–400)
RBC: 4.29 MIL/uL (ref 3.87–5.11)
RDW: 13.4 % (ref 11.5–15.5)
WBC: 6.7 10*3/uL (ref 4.0–10.5)

## 2017-06-09 LAB — POCT I-STAT TROPONIN I
Troponin i, poc: 0 ng/mL (ref 0.00–0.08)
Troponin i, poc: 0 ng/mL (ref 0.00–0.08)

## 2017-06-09 MED ORDER — NITROGLYCERIN 0.4 MG SL SUBL
0.4000 mg | SUBLINGUAL_TABLET | SUBLINGUAL | Status: DC | PRN
  Administered 2017-06-09: 0.4 mg via SUBLINGUAL
  Filled 2017-06-09: qty 1

## 2017-06-09 MED ORDER — KETOROLAC TROMETHAMINE 15 MG/ML IJ SOLN
15.0000 mg | Freq: Once | INTRAMUSCULAR | Status: AC
  Administered 2017-06-09: 15 mg via INTRAVENOUS
  Filled 2017-06-09: qty 1

## 2017-06-09 MED ORDER — HYDROCODONE-ACETAMINOPHEN 5-325 MG PO TABS: 1.0000 | ORAL_TABLET | Freq: Two times a day (BID) | ORAL | 0 refills | Status: DC | PRN

## 2017-06-09 MED ORDER — ASPIRIN 81 MG PO CHEW
324.0000 mg | CHEWABLE_TABLET | Freq: Once | ORAL | Status: AC
  Administered 2017-06-09: 324 mg via ORAL
  Filled 2017-06-09: qty 4

## 2017-06-09 MED ORDER — IBUPROFEN 600 MG PO TABS: 600.0000 mg | ORAL_TABLET | Freq: Four times a day (QID) | ORAL | 0 refills | Status: DC | PRN

## 2017-06-09 MED ORDER — HYDROCODONE-ACETAMINOPHEN 5-325 MG PO TABS
1.0000 | ORAL_TABLET | Freq: Once | ORAL | Status: AC
  Administered 2017-06-09: 1 via ORAL
  Filled 2017-06-09: qty 1

## 2017-06-09 NOTE — ED Notes (Signed)
She states the first ntg did not help; also b/p precludes the giving of any more ntg.

## 2017-06-09 NOTE — Discharge Instructions (Signed)
We saw Nicole [name] in the ER for the chest pain/shortness of breath. All of our cardiac workup is normal, including labs, EKG and chest X-RAY are normal. We suspect that Nicole [name] have pericarditis, but we also recognize that Nicole [name] have family history of cardiac disease - and thus it is important for Nicole [name] to be seen by Cardiologist.  Please return to the ER if Nicole [name] have worsening chest pain, shortness of breath, pain radiating to your jaw, shoulder, or back, sweats or fainting. Otherwise see the Cardiologist or your primary care doctor as requested.

## 2017-06-09 NOTE — ED Provider Notes (Signed)
[facility] DEPT Provider Note   CSN: [id number] Arrival date & time: 06/09/17  5465     History   Chief Complaint Chief Complaint  Patient presents with  . Chest Pain    HPI Nicole [name] is a 41 y.o. female.  HPI  Pt comes in with cc of chest pain. PMX of GERD, Seizure disorder, OSA, Migraine headache, ? Pericarditis in 2015. Pt's chest pain started yday, unprovoked and has been getting worse since then. Chest pain is sharp and it radiates down her L arm. Pt's pain is worse when she is laying down and she has associated dib. With exertion she also has dib. Pt also feels like she is having difficulty swallowing and some sweats. She has family hx of premature CAD - mother and grand mother.   Pt has no hx of PE, DVT and denies any exogenous hormone (testosterone / estrogen) use, long distance travels or surgery in the past 6 weeks, active cancer, recent immobilization.    Past Medical History:  Diagnosis Date  . Anemia   . Asthma   . Blood transfusion 2011   r/t anemia  . Fibroid   . Migraine   . Migraine   . Seizures [facility])    last sz Jan 2012  . Seizures ([place])   . Sickle cell trait ([employer])   . Sickle cell trait ([place])   . Tooth abscess     Patient Active Problem List   Diagnosis Date Noted  . 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 Depression   . Generalized abdominal pain   . Fatigue 10/28/2015  . Adjustment disorder with depressed mood 10/21/2015  . Dizziness   . Weakness 11/18/2014  . OSA (obstructive sleep apnea) 10/21/2014  . Acute rhinosinusitis 10/09/2014  . Leg pain, lateral 09/04/2014  . Migraines 08/13/2014  . Seizure ([place]) 06/04/2014  . Leiomyoma of uterus, unspecified 02/11/2014  . GERD (gastroesophageal reflux disease) 01/28/2014  . Candidiasis of vulva and vagina 01/28/2014  . Unspecified symptom associated with female genital organs 01/28/2014  . Chest pain 01/20/2014  . Acute pericarditis, unspecified 01/20/2014    Past Surgical History:  Procedure  Laterality Date  . TUBAL LIGATION Bilateral 2005    OB History    Gravida Para Term Preterm AB Living   3 2 1 1 1 2    SAB TAB Ectopic Multiple Live Births   0 0 1 0 2       Home Medications    Prior to Admission medications   Medication Sig Start Date End Date Taking? Authorizing Provider  acetaminophen (TYLENOL) 500 MG tablet Take 1 tablet (500 mg total) by mouth every 6 (six) hours as needed. Patient taking differently: Take 1,000 mg by mouth every 6 (six) hours as needed.  09/27/15  Yes Gloriann Loan, PA-C  clonazePAM (KLONOPIN) 0.5 MG tablet Take 1 tablet (0.5 mg total) by mouth 2 (two) times daily. 11/05/16  Yes Montine Circle, PA-C  cyclobenzaprine (FLEXERIL) 10 MG tablet Take 1 tablet (10 mg total) by mouth at bedtime. 05/11/17  Yes Fawze, Mina A, PA-C  FLUoxetine (PROZAC) 20 MG capsule Take 1 capsule (20 mg total) by mouth daily. 10/28/15  Yes Haney, Alyssa A, MD  gabapentin (NEURONTIN) 300 MG capsule Take 1 capsule (300 mg total) by mouth 3 (three) times daily. Patient taking differently: Take 300-1,200 mg by mouth 3 (three) times daily. Takes one capsule twice daily and then takes four capsules at bedtime. 01/25/15  Yes Tanna Furry, MD  levETIRAcetam [facility])  1000 MG tablet TAKE 1 TABLET BY MOUTH TWICE A DAY Patient taking differently: TAKE 1000 MG IN THE MORNING AND 2000 MG AT BEDTIME 03/07/16  Yes Leone Brand, MD  medroxyPROGESTERone (DEPO-PROVERA) 150 MG/ML injection Inject 1 mL (150 mg total) into the muscle every 3 (three) months. Inject on  the 22nd of each month 03/30/16  Yes Shelly Bombard, MD  meloxicam (MOBIC) 7.5 MG tablet Take 1 tablet (7.5 mg total) by mouth 2 (two) times daily. 05/11/17  Yes Fawze, Mina A, PA-C  methocarbamol (ROBAXIN) 500 MG tablet Take 1 tablet (500 mg total) by mouth 2 (two) times daily. 02/06/17  Yes Joy, Shawn C, PA-C  SUMAtriptan Succinate (IMITREX PO) Take 1 tablet by mouth as needed (headaches). Pt does not know dosage, and her pharmacy  said if she had it filled at their store, it has been so long it has fallen off their record.   Yes [provider]  topiramate (TOPAMAX) 25 MG tablet TAKE 3 TABLETS BY MOUTH DAILY Patient taking differently: TAKE 75mg  BY MOUTH TWICE DAILY 03/07/16  Yes Leone Brand, MD  HYDROcodone-acetaminophen (NORCO/VICODIN) 5-325 MG tablet Take 1 tablet by mouth every 12 (twelve) hours as needed for severe pain. 06/09/17   Varney Biles, MD  ibuprofen (ADVIL,MOTRIN) 600 MG tablet Take 1 tablet (600 mg total) by mouth every 6 (six) hours as needed. 06/09/17   Varney Biles, MD    Family History Family History  Problem Relation Age of Onset  . Hypertension Mother   . Diabetes Mother   . Cancer Mother        Colon, brain, 2 other  . Asthma Mother   . Clotting disorder Mother   . Heart disease Mother        25, stents  . Stroke Mother        2012  . Diabetes Father   . Asthma Father   . Clotting disorder Father   . Sickle cell anemia Father   . Heart disease Father        2000, stents  . Hypertension Father   . Stroke Father        2007  . Diabetes Maternal Grandmother   . Asthma Sister   . Asthma Brother   . Asthma Paternal Grandmother   . Anesthesia problems Neg Hx     Social History Social History  Substance Use Topics  . Smoking status: Former Smoker    Quit date: 03/27/2014  . Smokeless tobacco: Never Used  . Alcohol use No     Allergies   Shrimp [shellfish allergy]; Tea; Penicillins; and Sulfa antibiotics   Review of Systems Review of Systems  Respiratory: Positive for shortness of breath.   Cardiovascular: Positive for chest pain.  All other systems reviewed and are negative.    Physical Exam Updated Vital Signs BP 97/81 (BP Location: Right Arm)   Pulse (!) 54   Temp 98.2 F (36.8 C) (Oral)   Resp 18   LMP 06/07/2017   SpO2 100%   Physical Exam  Constitutional: She is oriented to person, place, and time. She appears well-developed.  HENT:    Head: Normocephalic and atraumatic.  Eyes: EOM are normal.  Neck: Normal range of motion. Neck supple. No JVD present.  Cardiovascular: Normal rate and intact distal pulses.  Exam reveals no friction rub.   No murmur heard. Pulmonary/Chest: Effort normal. She has no wheezes. She has no rales.  Abdominal: Bowel sounds are normal.  Musculoskeletal:  She exhibits no edema or tenderness.  Neurological: She is alert and oriented to person, place, and time.  Skin: Skin is warm and dry.  Nursing note and vitals reviewed.    ED Treatments / Results  Labs (all labs ordered are listed, but only abnormal results are displayed) Labs Reviewed  BASIC METABOLIC PANEL - Abnormal; Notable for the following:       Result Value   CO2 21 (*)    Calcium 8.7 (*)    All other components within normal limits  CBC  I-STAT TROPOININ, ED  POCT I-STAT TROPONIN I  I-STAT TROPOININ, ED  POCT I-STAT TROPONIN I    EKG  EKG Interpretation  Date/Time:  Saturday June 09 2017 06:57:20 EDT Ventricular Rate:  73 PR Interval:    QRS Duration: 82 QT Interval:  380 QTC Calculation: 419 R Axis:   52 Text Interpretation:  Sinus rhythm Low voltage, precordial leads No significant change since last tracing Confirmed by Merrily Pew [phone]) on 06/09/2017 7:15:42 AM        Radiology Dg Chest 2 View  Result Date: 06/09/2017 CLINICAL DATA:  Chest pain beginning yesterday. Nonproductive cough. Shortness of breath. EXAM: CHEST  2 VIEW COMPARISON:  05/11/2017 FINDINGS: The heart size and mediastinal contours are within normal limits. Both lungs are clear. The visualized skeletal structures are unremarkable. IMPRESSION: Negative.  No active cardiopulmonary disease. Electronically Signed   By: Earle Gell M.D.   On: 06/09/2017 07:26    Procedures Procedures (including critical care time)  Medications Ordered in ED Medications  nitroGLYCERIN (NITROSTAT) SL tablet 0.4 mg (0.4 mg Sublingual Given 06/09/17 1303)   HYDROcodone-acetaminophen (NORCO/VICODIN) 5-325 MG per tablet 1 tablet (not administered)  aspirin chewable tablet 324 mg (324 mg Oral Given 06/09/17 0930)  ketorolac (TORADOL) 15 MG/ML injection 15 mg (15 mg Intravenous Given 06/09/17 1026)     Initial Impression / Assessment and Plan / ED Course  I have reviewed the triage vital signs and the nursing notes.  Pertinent labs & imaging results that were available during my care of the patient were reviewed by me and considered in my medical decision making (see chart for details).  Clinical Course as of Jun 10 1343  Sat Jun 09, 2017  1343 Results from the ER workup discussed with the patient face to face and all questions answered to the best of my ability.  I spoke with Dr. Debara Pickett, Cardiology as well -and we discussed the presentation, the famly hx , the normal ekg and trops and the symptoms having some pericarditis and some ACS symptoms. Dr. Debara Pickett will ensure pt gets close f/u.  [AN]    Clinical Course User Index [AN] Varney Biles, MD    Pt comes in with cc of chest pain. Pt is sitting upright when I walked in, as the pain improves with her sitting up. She however does indicate that her chest pain is radiating down her L arm, there is associated dib and there is family hx of premature CAD.  DDX: Pericarditis ACS Myocarditis CHF PE  Pt is PERC neg. EKG shows no STEMI. Pain has been present for several hours now - so if there was underlying ischemia, we would expect trops to go up. Still we will get serial trops and ekg. Clinically, pericarditis is highest in the ddx, but the ekg shows no clear diffuse ST elevation. Her 2015 echo was also inconclusive.  Plan - prompt Cardio f/u if the workup is neg here.  Final Clinical Impressions(s) / ED Diagnoses   Final diagnoses:  Acute idiopathic pericarditis    New Prescriptions New Prescriptions   HYDROCODONE-ACETAMINOPHEN (NORCO/VICODIN) 5-325 MG TABLET    Take 1 tablet by  mouth every 12 (twelve) hours as needed for severe pain.   IBUPROFEN (ADVIL,MOTRIN) 600 MG TABLET    Take 1 tablet (600 mg total) by mouth every 6 (six) hours as needed.     Varney Biles, MD 06/09/17 1344

## 2017-06-09 NOTE — ED Notes (Signed)
After some difficulty extablishing IV access, my colleague has just started an u/s-guided IV.

## 2017-06-09 NOTE — ED Triage Notes (Signed)
Pt c/o chest pain that has worsened since yesterday. Pt feels as if food is stuck in their throat. Ft awakened to SOB and increased chest pain from yesterday.

## 2017-07-03 ENCOUNTER — Ambulatory Visit: Payer: Self-pay | Admitting: Physician Assistant

## 2017-07-04 IMAGING — CT CT HEAD W/O CM
3 of 4 series · 14 of 47 positions shown, 16 images · non-contrast
Comparison: 04/02/2016, MRI 10/20/2015

CLINICAL DATA: Generalized weakness headache

EXAM:
CT HEAD WITHOUT CONTRAST
TECHNIQUE: Contiguous axial images were obtained from the base of the skull
through the vertex without intravenous contrast.

[Series 2: head w/o · axial · non-contrast · 0.45mm/px · z∈[-178,-58]mm · 8 of 30 slices shown, 10 images]
[im 3/30  brain]
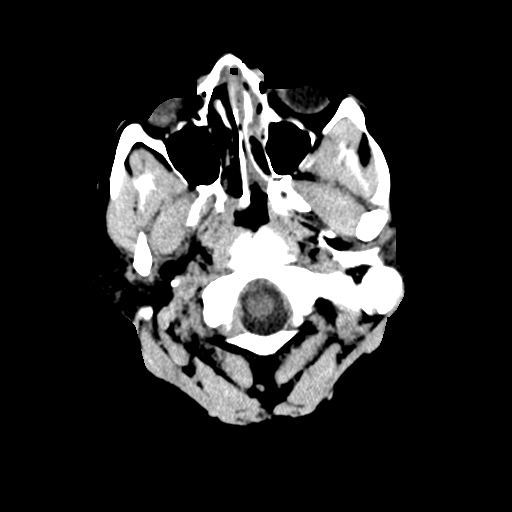
[im 3/30  bone]
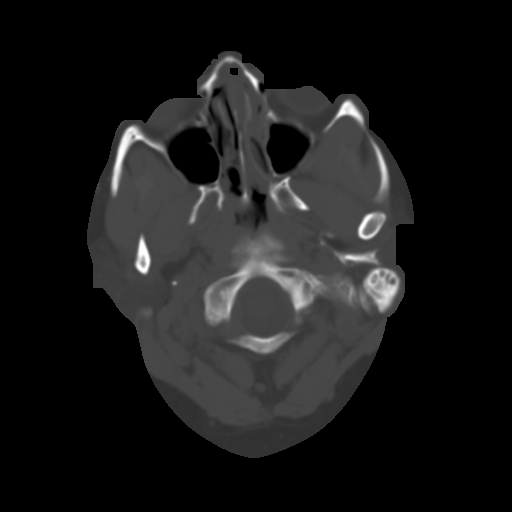
[im 7/30  brain]
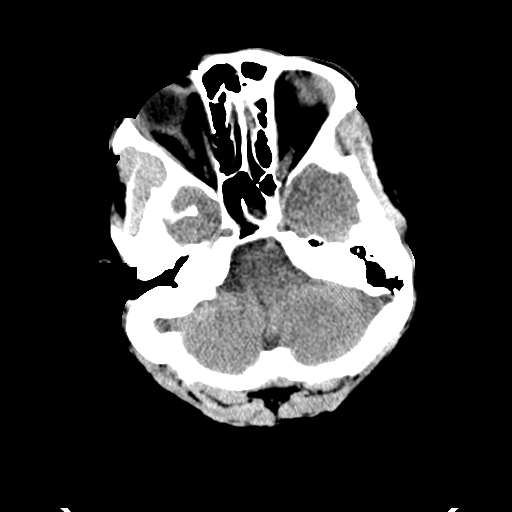
[im 11/30  brain]
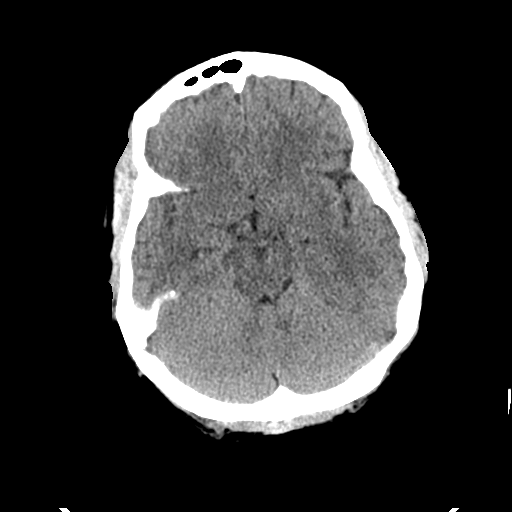
[im 13/30  brain]
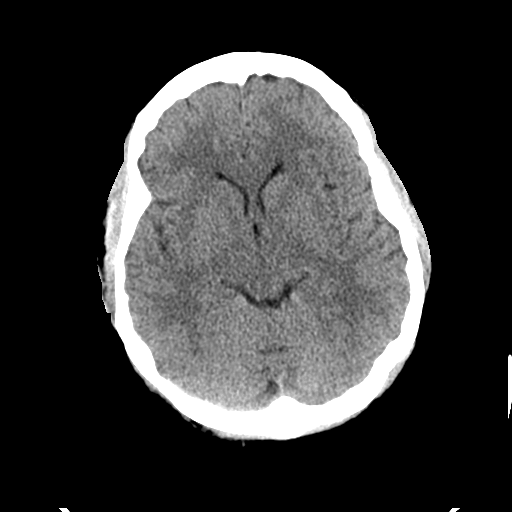
[im 17/30  brain]
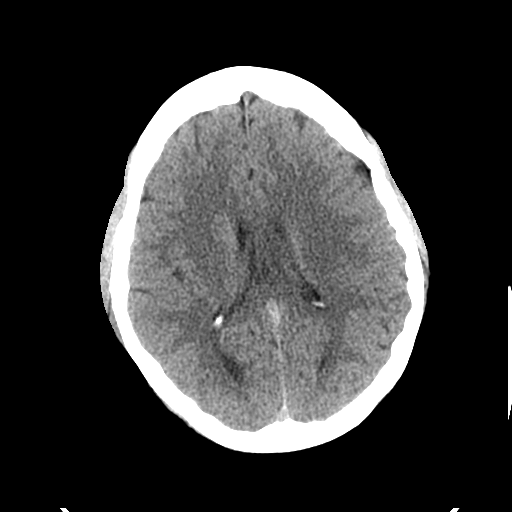
[im 17/30  bone]
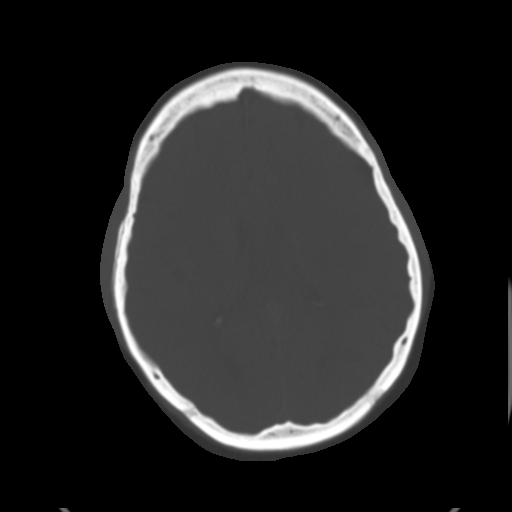
[im 19/30  brain]
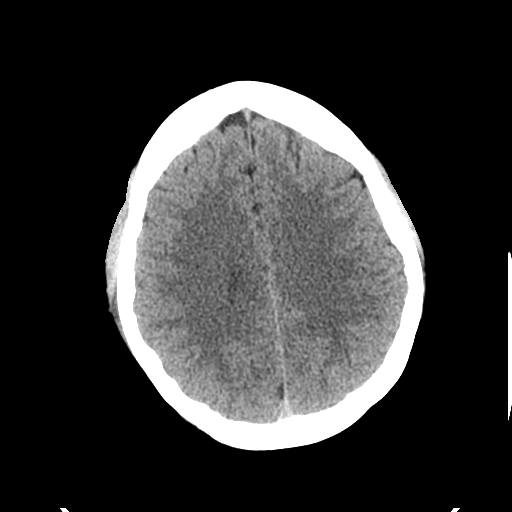
[im 23/30  brain]
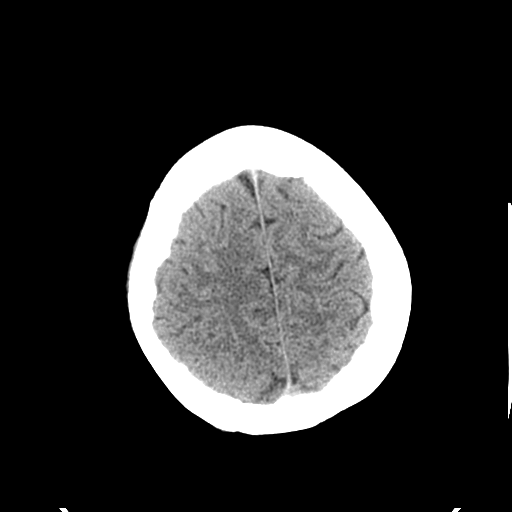
[im 27/30  brain]
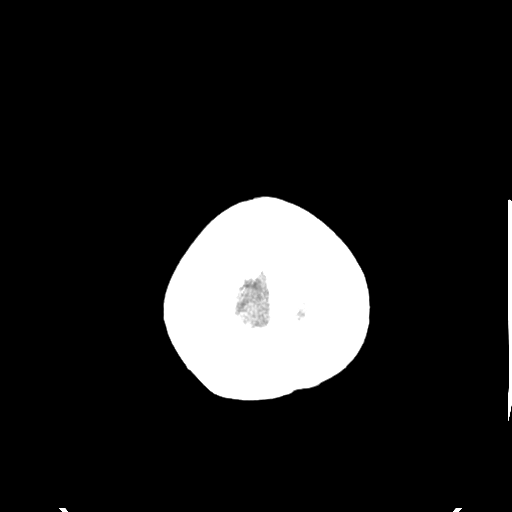

[Series 4: coronal · coronal · 0.29mm/px · 3 of 77 slices shown]
[im 26/77  brain]
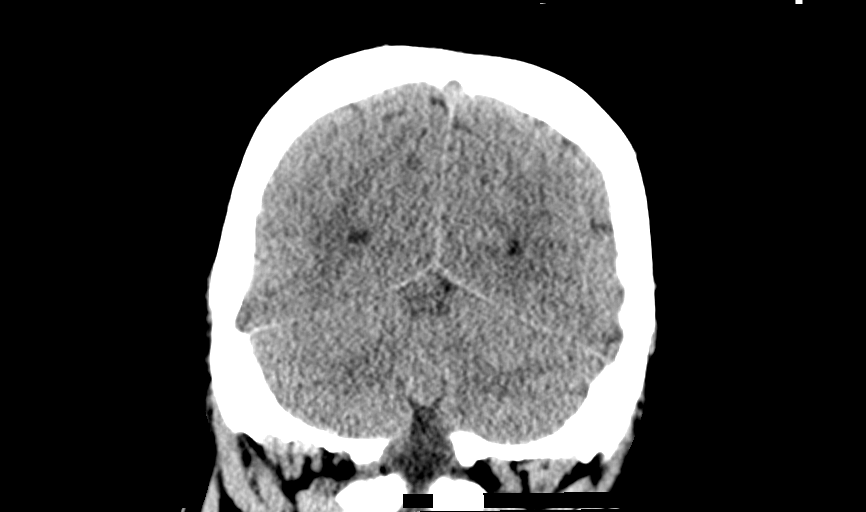
[im 34/77  brain]
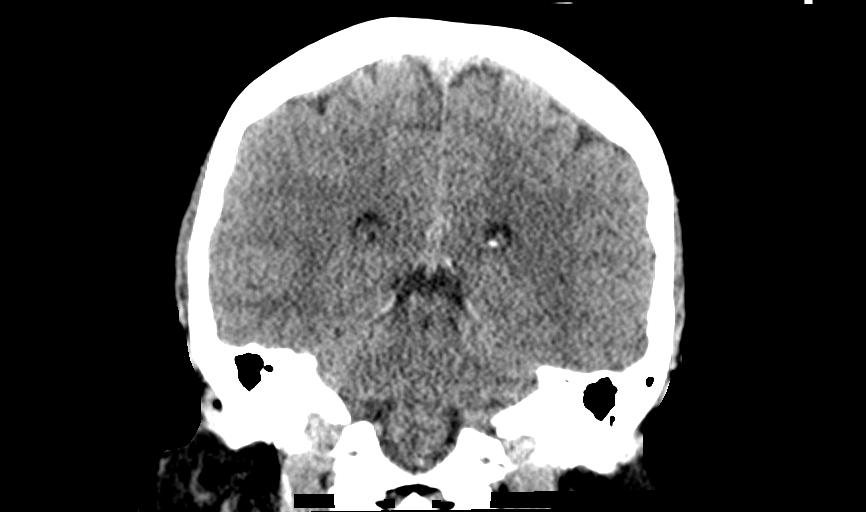
[im 43/77  brain]
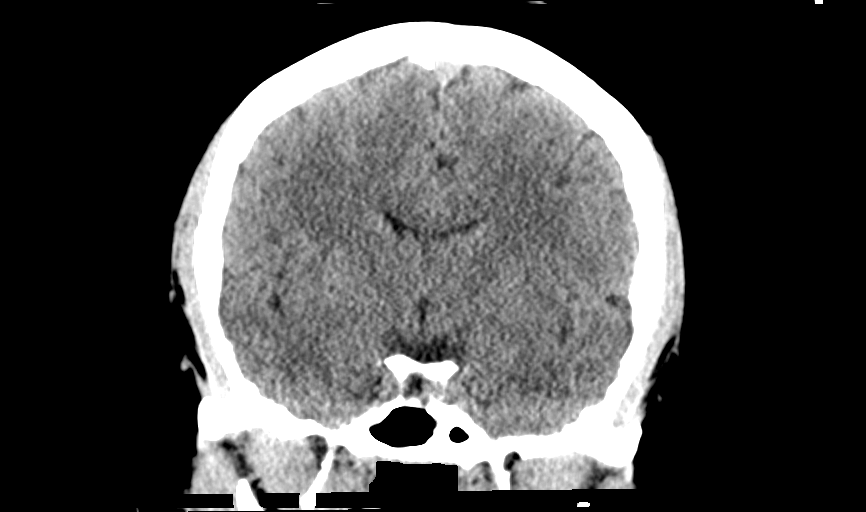

[Series 5: sagittal · sagittal · 0.29mm/px · 3 of 77 slices shown]
[im 26/77  brain]
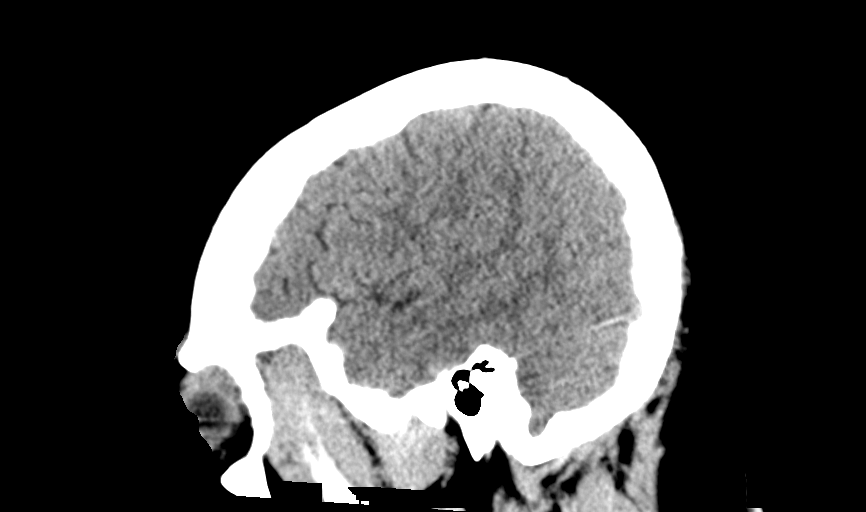
[im 39/77  brain]
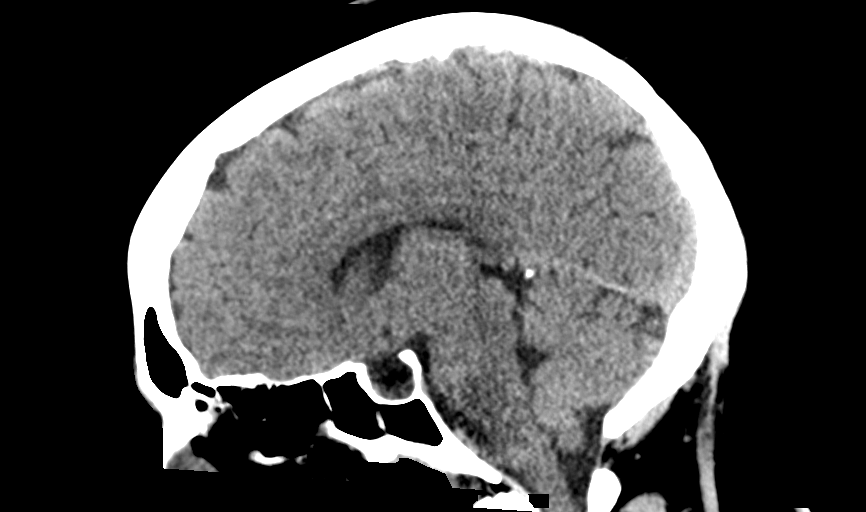
[im 51/77  brain]
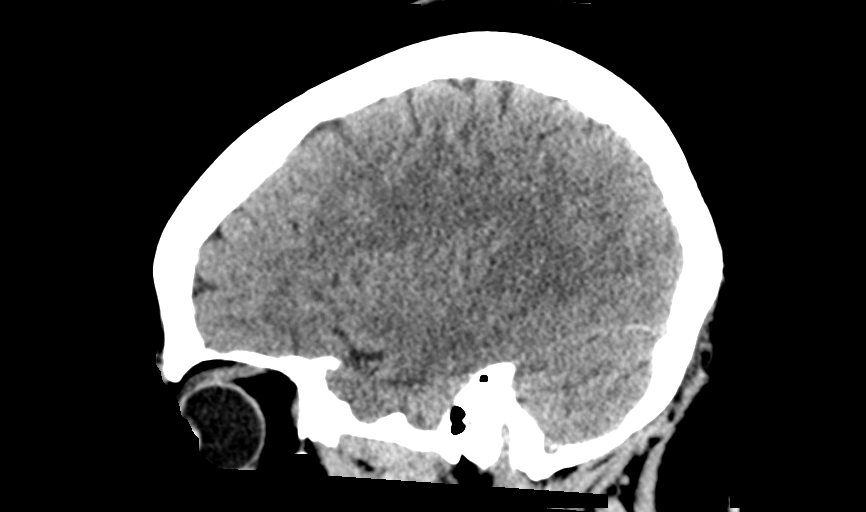

[14 of 47 positions shown; findings below may reference images not displayed]

FINDINGS: Brain: No evidence of acute infarction, hemorrhage, hydrocephalus,
extra-axial collection or mass lesion/mass effect. Partially empty
sella as before.

Vascular: No hyperdense vessel or unexpected calcification.

Skull: Mild right mastoid sclerosis.  No acute skull fracture.

Sinuses/Orbits: Mucosal thickening in the maxillary and ethmoid
sinuses. No acute orbital abnormality.

Other: None
IMPRESSION: 1. No CT evidence for acute intracranial abnormality.
2. Paranasal sinus disease

## 2017-07-26 ENCOUNTER — Ambulatory Visit: Payer: Self-pay | Admitting: Physician Assistant

## 2017-08-15 ENCOUNTER — Encounter: Payer: Self-pay | Admitting: Physician Assistant

## 2017-08-15 NOTE — Progress Notes (Deleted)
Cardiology Office Note    Date:  08/15/2017  ID:  Nicole [name], DOB 01/14/1976, MRN [id number] PCP:  Smiley Houseman, MD  Cardiologist:  Dr. Meda Coffee   Chief Complaint: ***  History of Present Illness:  Nicole [name] is a 41 y.o. female with history of anemia, sickle cell trait, asthma, migraines, seizures, fibroids, possible pericarditis, family history of CAD who returns for follow-up of pericarditis. She was evaluated by Dr. Meda Coffee 02/2014 for episode of chest pain for the past month with ER visit ruling out for ACS. She was felt to have possible pericarditis. She had not had any improvement after 1 month of colchicine and NSAIDS. 2D echo was unremarkable without pericardial effusion, EF 60-65%. CRP was negative and ESR was borderline at 23 (normal 22). Stress MRI was planned but due to technical difficulty (inability to get IV), it wasn't done. Indomethacin was discontinued after 4 weeks and she was continued on colchicine. She was seen the hospital 11/2014 when she was admitted with n/v and epigastric pain at which time Dr. Meda Coffee recommended to discontinue colchicine and avoid NSAIDS. There was also patient-reported GI bleeding. CBC wnl. During that admission, IM discharge summary states " On exam, she was TTP on abdominal exam.  She was destractable.  Generalized weakness was observed.  There was questionable secondary gain, as patient mentioned disability during evaluation.  Her skin was extremely sensitive to touch." She's since had admission for seizures in setting of Keppra noncompliance in 2016 and 2017. In June 2018 she was seen in the ED twice for chest pain. Troponins were negative on each encounter along with CXR, with otherwise labs showing normal CBC,  Cr 0.80, K 3.8. This pain was in the context of other symptoms including difficulty swallowing, associated headache, and chronic nasal congestion.  lipids 2015  Chest pain History of possible pericarditis History of anemia Family  history of CAD    Past Medical History:  Diagnosis Date  . Anemia   . Asthma   . Blood transfusion 2011   r/t anemia  . Fibroid   . Migraine   . Migraine   . Seizures [facility])    last sz Jan 2012  . Seizures ([place])   . Sickle cell trait ([place])   . Sickle cell trait ([place])   . Tooth abscess     Past Surgical History:  Procedure Laterality Date  . TUBAL LIGATION Bilateral 2005    Current Medications: No outpatient prescriptions have been marked as taking for the 08/16/17 encounter (Appointment) with Charlie Pitter, PA-C.     Allergies:   Shrimp [shellfish allergy]; Tea; Penicillins; and Sulfa antibiotics   Social History   Social History  . Marital status: Married    Spouse name: N/A  . Number of children: N/A  . Years of education: N/A   Social History Main Topics  . Smoking status: Former Smoker    Quit date: 03/27/2014  . Smokeless tobacco: Never Used  . Alcohol use No  . Drug use: No  . Sexual activity: Yes    Partners: Male    Birth control/ protection: Condom, Injection, Surgical   Other Topics Concern  . Not on file   Social History Narrative  . No narrative on file     Family History:  Family History  Problem Relation Age of Onset  . Hypertension Mother   . Diabetes Mother   . Cancer Mother        Colon, brain, 2 other  .  Asthma Mother   . Clotting disorder Mother   . Heart disease Mother        50, stents  . Stroke Mother        2012  . Diabetes Father   . Asthma Father   . Clotting disorder Father   . Sickle cell anemia Father   . Heart disease Father        2000, stents  . Hypertension Father   . Stroke Father        2007  . Diabetes Maternal Grandmother   . Asthma Sister   . Asthma Brother   . Asthma Paternal Grandmother   . Anesthesia problems Neg Hx    ***  ROS:   Please see the history of present illness. Otherwise, review of systems is positive for ***.  All other systems are reviewed and otherwise negative.    PHYSICAL  EXAM:   VS:  There were no vitals taken for this visit.  BMI: There is no height or weight on file to calculate BMI. GEN: Well nourished, well developed, in no acute distress  HEENT: normocephalic, atraumatic Neck: no JVD, carotid bruits, or masses Cardiac: ***RRR; no murmurs, rubs, or gallops, no edema  Respiratory:  clear to auscultation bilaterally, normal work of breathing GI: soft, nontender, nondistended, + BS MS: no deformity or atrophy  Skin: warm and dry, no rash Neuro:  Alert and Oriented x 3, Strength and sensation are intact, follows commands Psych: euthymic mood, full affect  Wt Readings from Last 3 Encounters:  03/04/17 240 lb (108.9 kg)  02/06/17 241 lb 7 oz (109.5 kg)  01/10/17 235 lb 11.2 oz (106.9 kg)      Studies/Labs Reviewed:   EKG:  EKG was ordered today and personally reviewed by me and demonstrates *** EKG was not ordered today.***  Recent Labs: 03/04/2017: ALT 14 06/09/2017: BUN 8; Creatinine, Ser 0.80; Hemoglobin 12.6; Platelets 273; Potassium 3.8; Sodium 138   Lipid Panel    Component Value Date/Time   CHOL 170 01/20/2014 1553   TRIG 91 01/20/2014 1553   HDL 41 01/20/2014 1553   LDLCALC 111 (H) 01/20/2014 1553    Additional studies/ records that were reviewed today include: Summarized above.***    ASSESSMENT & PLAN:   1. ***  Disposition: F/u with ***   Medication Adjustments/Labs and Tests Ordered: Current medicines are reviewed at length with the patient today.  Concerns regarding medicines are outlined above. Medication changes, Labs and Tests ordered today are summarized above and listed in the Patient Instructions accessible in Encounters.   Signed, Charlie Pitter, PA-C  08/15/2017 6:26 PM    [facility] [address], [place], [place]  [address] Phone: [phone]; Fax: [phone]

## 2017-08-16 ENCOUNTER — Ambulatory Visit: Payer: Self-pay | Admitting: Physician Assistant

## 2017-08-17 ENCOUNTER — Encounter: Payer: Self-pay | Admitting: Physician Assistant

## 2017-08-18 ENCOUNTER — Encounter (HOSPITAL_COMMUNITY): Payer: Self-pay

## 2017-08-18 ENCOUNTER — Emergency Department (HOSPITAL_COMMUNITY)
Admission: EM | Admit: 2017-08-18 | Discharge: 2017-08-19 | Disposition: A | Discharge status: Home / Self Care | Payer: Self-pay | Attending: Emergency Medicine | Admitting: Emergency Medicine

## 2017-08-18 DIAGNOSIS — J45909 Unspecified asthma, uncomplicated: Secondary | ICD-10-CM | POA: Insufficient documentation

## 2017-08-18 DIAGNOSIS — G43909 Migraine, unspecified, not intractable, without status migrainosus: Secondary | ICD-10-CM

## 2017-08-18 DIAGNOSIS — Z87891 Personal history of nicotine dependence: Secondary | ICD-10-CM | POA: Insufficient documentation

## 2017-08-18 DIAGNOSIS — Z79899 Other long term (current) drug therapy: Secondary | ICD-10-CM | POA: Insufficient documentation

## 2017-08-18 NOTE — ED Notes (Signed)
Bed: WA03 Expected date:  Expected time:  Means of arrival:  Comments: 

## 2017-08-18 NOTE — ED Triage Notes (Signed)
Pt complains of a migraine with no relief from her topamax

## 2017-08-19 LAB — I-STAT CHEM 8, ED
BUN: 7 mg/dL (ref 6–20)
CREATININE: 0.7 mg/dL (ref 0.44–1.00)
Calcium, Ion: 1.13 mmol/L — ABNORMAL LOW (ref 1.15–1.40)
Chloride: 104 mmol/L (ref 101–111)
GLUCOSE: 93 mg/dL (ref 65–99)
HEMATOCRIT: 36 % (ref 36.0–46.0)
HEMOGLOBIN: 12.2 g/dL (ref 12.0–15.0)
Potassium: 3.6 mmol/L (ref 3.5–5.1)
Sodium: 140 mmol/L (ref 135–145)
TCO2: 25 mmol/L (ref 22–32)

## 2017-08-19 MED ORDER — LEVETIRACETAM 1000 MG PO TABS: 1000.0000 mg | ORAL_TABLET | Freq: Two times a day (BID) | ORAL | 3 refills | Status: DC

## 2017-08-19 MED ORDER — PROCHLORPERAZINE EDISYLATE 5 MG/ML IJ SOLN
10.0000 mg | Freq: Once | INTRAMUSCULAR | Status: AC
  Administered 2017-08-19: 10 mg via INTRAVENOUS
  Filled 2017-08-19: qty 2

## 2017-08-19 MED ORDER — DIPHENHYDRAMINE HCL 50 MG/ML IJ SOLN
25.0000 mg | Freq: Once | INTRAMUSCULAR | Status: AC
  Administered 2017-08-19: 25 mg via INTRAVENOUS
  Filled 2017-08-19: qty 1

## 2017-08-19 MED ORDER — SODIUM CHLORIDE 0.9 % IV SOLN
1000.0000 mg | Freq: Once | INTRAVENOUS | Status: AC
  Administered 2017-08-19: 1000 mg via INTRAVENOUS
  Filled 2017-08-19: qty 10

## 2017-08-19 NOTE — ED Provider Notes (Signed)
[facility] DEPT Provider Note   CSN: [id number] Arrival date & time: 08/18/17  2208     History   Chief Complaint Chief Complaint  Patient presents with  . Migraine    HPI Nicole [name] is a 41 y.o. female.  41 year old female with a history of asthma, migraine headaches, seizures, and sickle cell trait presents to the emergency department for complaints of headache consistent with prior migraines. She states that she has had a migraine headache intermittently over the past 2 weeks. Pain has been constant over the last 2 days.She states that pain localizes behind her left eye. It has been unrelieved with Topamax. She has also tried Tylenol for pain without relief. Symptoms associated with photophobia. No extremity numbness, paresthesias, weakness, seizure activity. She notes being out of her Imitrex as well as her Keppra over the past 4 weeks.      Past Medical History:  Diagnosis Date  . Anemia   . Asthma   . Atypical chest pain    a. ?pericarditis 2015.  Marland Kitchen Blood transfusion 2011   r/t anemia  . Fibroid   . Migraine   . Seizures ([place])   . Sickle cell trait ([place])   . Tooth abscess     Patient Active Problem List   Diagnosis Date Noted  . 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 Depression   . Generalized abdominal pain   . Fatigue 10/28/2015  . Adjustment disorder with depressed mood 10/21/2015  . Dizziness   . Weakness 11/18/2014  . OSA (obstructive sleep apnea) 10/21/2014  . Acute rhinosinusitis 10/09/2014  . Leg pain, lateral 09/04/2014  . Migraines 08/13/2014  . Seizure ([place]) 06/04/2014  . Leiomyoma of uterus, unspecified 02/11/2014  . GERD (gastroesophageal reflux disease) 01/28/2014  . Candidiasis of vulva and vagina 01/28/2014  . Unspecified symptom associated with female genital organs 01/28/2014  . Chest pain 01/20/2014  . Acute pericarditis, unspecified 01/20/2014    Past Surgical History:  Procedure Laterality Date  . TUBAL LIGATION Bilateral 2005     OB History    Gravida Para Term Preterm AB Living   3 2 1 1 1 2    SAB TAB Ectopic Multiple Live Births   0 0 1 0 2       Home Medications    Prior to Admission medications   Medication Sig Start Date End Date Taking? Authorizing Provider  acetaminophen (TYLENOL) 500 MG tablet Take 1 tablet (500 mg total) by mouth every 6 (six) hours as needed. Patient taking differently: Take 1,000 mg by mouth every 6 (six) hours as needed for moderate pain.  09/27/15  Yes Gloriann Loan, PA-C  cetirizine (ZYRTEC) 10 MG tablet Take 10 mg by mouth daily.   Yes [provider]  clonazePAM (KLONOPIN) 0.5 MG tablet Take 1 tablet (0.5 mg total) by mouth 2 (two) times daily. 11/05/16  Yes Montine Circle, PA-C  cyclobenzaprine (FLEXERIL) 10 MG tablet Take 1 tablet (10 mg total) by mouth at bedtime. 05/11/17  Yes Fawze, Mina A, PA-C  FLUoxetine (PROZAC) 20 MG capsule Take 1 capsule (20 mg total) by mouth daily. 10/28/15  Yes Haney, Alyssa A, MD  gabapentin (NEURONTIN) 300 MG capsule Take 1 capsule (300 mg total) by mouth 3 (three) times daily. Patient taking differently: Take 300-1,200 mg by mouth 3 (three) times daily. Takes one capsule twice daily and then takes four capsules at bedtime. 01/25/15  Yes Tanna Furry, MD  HYDROcodone-acetaminophen (NORCO/VICODIN) 5-325 MG tablet Take 1 tablet by mouth every  12 (twelve) hours as needed for severe pain. 06/09/17  Yes Varney Biles, MD  ibuprofen (ADVIL,MOTRIN) 600 MG tablet Take 1 tablet (600 mg total) by mouth every 6 (six) hours as needed. Patient taking differently: Take 600 mg by mouth every 6 (six) hours as needed for moderate pain.  06/09/17  Yes Varney Biles, MD  medroxyPROGESTERone (DEPO-PROVERA) 150 MG/ML injection Inject 1 mL (150 mg total) into the muscle every 3 (three) months. Inject on  the 22nd of each month 03/30/16  Yes Shelly Bombard, MD  meloxicam (MOBIC) 7.5 MG tablet Take 1 tablet (7.5 mg total) by mouth 2 (two) times daily. 05/11/17   Yes Fawze, Mina A, PA-C  methocarbamol (ROBAXIN) 500 MG tablet Take 1 tablet (500 mg total) by mouth 2 (two) times daily. 02/06/17  Yes Joy, Shawn C, PA-C  topiramate (TOPAMAX) 25 MG tablet TAKE 3 TABLETS BY MOUTH DAILY Patient taking differently: TAKE 75mg  BY MOUTH TWICE DAILY 03/07/16  Yes Leone Brand, MD  levETIRAcetam (KEPPRA) 1000 MG tablet Take 1 tablet (1,000 mg total) by mouth 2 (two) times daily. 08/19/17   Antonietta Breach, PA-C    Family History Family History  Problem Relation Age of Onset  . Hypertension Mother   . Diabetes Mother   . Cancer Mother        Colon, brain, 2 other  . Asthma Mother   . Clotting disorder Mother   . Heart disease Mother        27, stents  . Stroke Mother        2012  . Diabetes Father   . Asthma Father   . Clotting disorder Father   . Sickle cell anemia Father   . Heart disease Father        2000, stents  . Hypertension Father   . Stroke Father        2007  . Diabetes Maternal Grandmother   . Asthma Sister   . Asthma Brother   . Asthma Paternal Grandmother   . Anesthesia problems Neg Hx     Social History Social History  Substance Use Topics  . Smoking status: Former Smoker    Quit date: 03/27/2014  . Smokeless tobacco: Never Used  . Alcohol use No     Allergies   Penicillins; Shrimp [shellfish allergy]; Tea; and Sulfa antibiotics   Review of Systems Review of Systems Ten systems reviewed and are negative for acute change, except as noted in the HPI.    Physical Exam Updated Vital Signs BP 118/65 (BP Location: Right Arm)   Pulse 72   Temp 97.7 F (36.5 C)   Resp 16   SpO2 97%   Physical Exam  Constitutional: She is oriented to person, place, and time. She appears well-developed and well-nourished. No distress.  HENT:  Head: Normocephalic and atraumatic.  Mouth/Throat: Oropharynx is clear and moist.  Symmetric rise of the uvula with phonation. Tongue midline.  Eyes: Pupils are equal, round, and reactive to  light. Conjunctivae and EOM are normal. No scleral icterus.  Neck: Normal range of motion.  No meningismus  Cardiovascular: Normal rate, regular rhythm and intact distal pulses.   Pulmonary/Chest: Effort normal. No respiratory distress.  Respirations even and unlabored  Musculoskeletal: Normal range of motion.  Neurological: She is alert and oriented to person, place, and time. No cranial nerve deficit. She exhibits normal muscle tone. Coordination normal.  GCS 15. Speech is goal oriented. No focal deficits noted.  Skin: Skin is warm and  dry. No rash noted. She is not diaphoretic. No erythema. No pallor.  Psychiatric: She has a normal mood and affect. Her behavior is normal.  Nursing note and vitals reviewed.    ED Treatments / Results  Labs (all labs ordered are listed, but only abnormal results are displayed) Labs Reviewed  I-STAT CHEM 8, ED - Abnormal; Notable for the following:       Result Value   Calcium, Ion 1.13 (*)    All other components within normal limits  LEVETIRACETAM LEVEL    EKG  EKG Interpretation None       Radiology No results found.  Procedures Procedures (including critical care time)  Medications Ordered in ED Medications  levETIRAcetam (KEPPRA) 1,000 mg in sodium chloride 0.9 % 100 mL IVPB (0 mg Intravenous Stopped 08/19/17 0240)  prochlorperazine (COMPAZINE) injection 10 mg (10 mg Intravenous Given 08/19/17 0141)  diphenhydrAMINE (BENADRYL) injection 25 mg (25 mg Intravenous Given 08/19/17 0140)    3:00 AM Patient reassessed; sleeping. Upon waking she reports significant improvement in her headache. She further expresses comfort with additional management on an outpatient basis. Plan for d/c and PCP follow up.   Initial Impression / Assessment and Plan / ED Course  I have reviewed the triage vital signs and the nursing notes.  Pertinent labs & imaging results that were available during my care of the patient were reviewed by me and considered in  my medical decision making (see chart for details).     41 year old female presents to the emergency department for headache. She reports that her headache feels similar to prior migraines. This headache has persisted longer than usual. It has localized behind her left eye. Patient is afebrile today without nuchal rigidity or meningismus. She has a nonfocal neurologic exam. No history of head injury or trauma.  Symptoms have improved with supportive management. Doubt emergent intracranial etiology today. Patient also given IV load of Keppra as she reports noncompliance with medication over the past month. She does have a known history of seizures. She denies any recent seizure-like activity. Patient expresses comfort with additional management on an outpatient basis given improvement in her headache over ED course. Patient given Rx for Keppra and instruction for PCP follow up. Return precautions discussed and provided. Patient discharged in stable condition with no unaddressed concerns.  Vitals:   08/18/17 2240 08/19/17 0059 08/19/17 0317  BP: 123/88 94/60 118/65  Pulse: 93 73 72  Resp: 18 18 16   Temp: 97.9 F (36.6 C)  97.7 F (36.5 C)  TempSrc: Oral    SpO2: 98% 100% 97%    Final Clinical Impressions(s) / ED Diagnoses   Final diagnoses:  Migraine without status migrainosus, not intractable, unspecified migraine type    New Prescriptions Current Discharge Medication List       Antonietta Breach, PA-C 08/19/17 [place], [place], DO 08/19/17 [phone]

## 2017-08-19 NOTE — ED Notes (Signed)
I-stat chem 8 resulted, given to PA [facility]

## 2017-08-19 NOTE — ED Notes (Signed)
Bed: WA03 Expected date:  Expected time:  Means of arrival:  Comments: 

## 2017-08-21 LAB — LEVETIRACETAM LEVEL: Levetiracetam Lvl: NOT DETECTED ug/mL (ref 10.0–40.0)

## 2017-09-12 ENCOUNTER — Encounter (HOSPITAL_COMMUNITY): Payer: Self-pay

## 2017-09-12 ENCOUNTER — Emergency Department (HOSPITAL_COMMUNITY): Payer: Self-pay

## 2017-09-12 ENCOUNTER — Emergency Department (HOSPITAL_COMMUNITY)
Admission: EM | Admit: 2017-09-12 | Discharge: 2017-09-13 | Disposition: A | Discharge status: Home / Self Care | Payer: Self-pay | Attending: Emergency Medicine | Admitting: Emergency Medicine

## 2017-09-12 DIAGNOSIS — Z7982 Long term (current) use of aspirin: Secondary | ICD-10-CM | POA: Insufficient documentation

## 2017-09-12 DIAGNOSIS — R079 Chest pain, unspecified: Secondary | ICD-10-CM

## 2017-09-12 DIAGNOSIS — J45909 Unspecified asthma, uncomplicated: Secondary | ICD-10-CM | POA: Insufficient documentation

## 2017-09-12 DIAGNOSIS — R0789 Other chest pain: Secondary | ICD-10-CM | POA: Insufficient documentation

## 2017-09-12 DIAGNOSIS — Z87891 Personal history of nicotine dependence: Secondary | ICD-10-CM | POA: Insufficient documentation

## 2017-09-12 DIAGNOSIS — D573 Sickle-cell trait: Secondary | ICD-10-CM | POA: Insufficient documentation

## 2017-09-12 DIAGNOSIS — R072 Precordial pain: Secondary | ICD-10-CM

## 2017-09-12 DIAGNOSIS — Z79899 Other long term (current) drug therapy: Secondary | ICD-10-CM | POA: Insufficient documentation

## 2017-09-12 LAB — BASIC METABOLIC PANEL
ANION GAP: 10 (ref 5–15)
BUN: 12 mg/dL (ref 6–20)
CALCIUM: 8.7 mg/dL — AB (ref 8.9–10.3)
CO2: 20 mmol/L — ABNORMAL LOW (ref 22–32)
Chloride: 107 mmol/L (ref 101–111)
Creatinine, Ser: 0.72 mg/dL (ref 0.44–1.00)
GFR calc non Af Amer: >60 mL/min (ref >60)
Glucose, Bld: 83 mg/dL (ref 65–99)
Potassium: 3.6 mmol/L (ref 3.5–5.1)
SODIUM: 137 mmol/L (ref 135–145)

## 2017-09-12 LAB — CBC
HCT: 33.8 % — ABNORMAL LOW (ref 36.0–46.0)
HEMOGLOBIN: 11.6 g/dL — AB (ref 12.0–15.0)
MCH: 29 pg (ref 26.0–34.0)
MCHC: 34.3 g/dL (ref 30.0–36.0)
MCV: 84.5 fL (ref 78.0–100.0)
PLATELETS: 258 10*3/uL (ref 150–400)
RBC: 4 MIL/uL (ref 3.87–5.11)
RDW: 13.3 % (ref 11.5–15.5)
WBC: 9.6 10*3/uL (ref 4.0–10.5)

## 2017-09-12 LAB — POCT I-STAT TROPONIN I: TROPONIN I, POC: 0 ng/mL (ref 0.00–0.08)

## 2017-09-12 NOTE — ED Triage Notes (Signed)
Pt complains of throbbing pain that radiates from her chest down her L arm that started earlier today. She reports taking aspirin about 45 mins ago with no relief. She reports a history of pericarditis. A&Ox4. Ambulatory.

## 2017-09-13 LAB — POCT I-STAT TROPONIN I: TROPONIN I, POC: 0 ng/mL (ref 0.00–0.08)

## 2017-09-13 NOTE — ED Provider Notes (Signed)
[facility] DEPT Provider Note   CSN: [id number] Arrival date & time: 09/12/17  2123     History   Chief Complaint Chief Complaint  Patient presents with  . Chest Pain    HPI Nicole [name] is a 41 y.o. female.  HPI 41 year old female with history of seizures, sickle cell trait, asthma, pericarditis comes in with chief complaint of chest pain. Patient reports that she started having chest pain 2 days ago. Chest pain is intermittent, left-sided and most on the left shoulder. Patient describes the pain as dull. He should present for pain last for about 2 or 3 minutes. Patient has no specific evoking factors, aggravating or relieving factors. Patient specifically reports no worsening of pain with inspiration, position, exertion. Patient denies any smoking, drug abuse. Patient does have family history of coronary artery disease, mother allegedly had coronary artery disease in her 72s.  Past Medical History:  Diagnosis Date  . Anemia   . Asthma   . Atypical chest pain    a. ?pericarditis 2015.  Marland Kitchen Blood transfusion 2011   r/t anemia  . Fibroid   . Migraine   . Seizures ([place])   . Sickle cell trait ([place])   . Tooth abscess     Patient Active Problem List   Diagnosis Date Noted  . 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 Depression   . Generalized abdominal pain   . Fatigue 10/28/2015  . Adjustment disorder with depressed mood 10/21/2015  . Dizziness   . Weakness 11/18/2014  . OSA (obstructive sleep apnea) 10/21/2014  . Acute rhinosinusitis 10/09/2014  . Leg pain, lateral 09/04/2014  . Migraines 08/13/2014  . Seizure ([place]) 06/04/2014  . Leiomyoma of uterus, unspecified 02/11/2014  . GERD (gastroesophageal reflux disease) 01/28/2014  . Candidiasis of vulva and vagina 01/28/2014  . Unspecified symptom associated with female genital organs 01/28/2014  . Chest pain 01/20/2014  . Acute pericarditis, unspecified 01/20/2014    Past Surgical History:  Procedure Laterality Date  . TUBAL  LIGATION Bilateral 2005    OB History    Gravida Para Term Preterm AB Living   3 2 1 1 1 2    SAB TAB Ectopic Multiple Live Births   0 0 1 0 2       Home Medications    Prior to Admission medications   Medication Sig Start Date End Date Taking? Authorizing Provider  acetaminophen (TYLENOL) 500 MG tablet Take 1 tablet (500 mg total) by mouth every 6 (six) hours as needed. Patient taking differently: Take 1,000 mg by mouth every 6 (six) hours as needed for moderate pain.  09/27/15  Yes Gloriann Loan, PA-C  aspirin 81 MG chewable tablet Chew 81 mg by mouth once.   Yes [provider]  cetirizine (ZYRTEC) 10 MG tablet Take 10 mg by mouth daily.   Yes [provider]  clonazePAM (KLONOPIN) 0.5 MG tablet Take 1 tablet (0.5 mg total) by mouth 2 (two) times daily. 11/05/16  Yes Montine Circle, PA-C  cyclobenzaprine (FLEXERIL) 10 MG tablet Take 1 tablet (10 mg total) by mouth at bedtime. 05/11/17  Yes Fawze, Mina A, PA-C  FLUoxetine (PROZAC) 20 MG capsule Take 1 capsule (20 mg total) by mouth daily. 10/28/15  Yes Haney, Alyssa A, MD  gabapentin (NEURONTIN) 300 MG capsule Take 1 capsule (300 mg total) by mouth 3 (three) times daily. Patient taking differently: Take 300-1,200 mg by mouth 3 (three) times daily. Takes one capsule twice daily and then takes four capsules at bedtime.  01/25/15  Yes Tanna Furry, MD  ibuprofen (ADVIL,MOTRIN) 600 MG tablet Take 1 tablet (600 mg total) by mouth every 6 (six) hours as needed. Patient taking differently: Take 600 mg by mouth every 6 (six) hours as needed for moderate pain.  06/09/17  Yes Varney Biles, MD  levETIRAcetam (KEPPRA) 1000 MG tablet Take 1 tablet (1,000 mg total) by mouth 2 (two) times daily. 08/19/17  Yes Antonietta Breach, PA-C  medroxyPROGESTERone (DEPO-PROVERA) 150 MG/ML injection Inject 1 mL (150 mg total) into the muscle every 3 (three) months. Inject on  the 22nd of each month 03/30/16  Yes Shelly Bombard, MD  meloxicam (MOBIC) 7.5  MG tablet Take 1 tablet (7.5 mg total) by mouth 2 (two) times daily. 05/11/17  Yes Fawze, Mina A, PA-C  methocarbamol (ROBAXIN) 500 MG tablet Take 1 tablet (500 mg total) by mouth 2 (two) times daily. 02/06/17  Yes Joy, Shawn C, PA-C  topiramate (TOPAMAX) 25 MG tablet TAKE 3 TABLETS BY MOUTH DAILY Patient taking differently: TAKE 75mg  BY MOUTH TWICE DAILY 03/07/16  Yes Leone Brand, MD    Family History Family History  Problem Relation Age of Onset  . Hypertension Mother   . Diabetes Mother   . Cancer Mother        Colon, brain, 2 other  . Asthma Mother   . Clotting disorder Mother   . Heart disease Mother        62, stents  . Stroke Mother        2012  . Diabetes Father   . Asthma Father   . Clotting disorder Father   . Sickle cell anemia Father   . Heart disease Father        2000, stents  . Hypertension Father   . Stroke Father        2007  . Diabetes Maternal Grandmother   . Asthma Sister   . Asthma Brother   . Asthma Paternal Grandmother   . Anesthesia problems Neg Hx     Social History Social History  Substance Use Topics  . Smoking status: Former Smoker    Quit date: 03/27/2014  . Smokeless tobacco: Never Used  . Alcohol use No     Allergies   Penicillins; Shrimp [shellfish allergy]; Tea; and Sulfa antibiotics   Review of Systems Review of Systems  Constitutional: Negative for activity change and diaphoresis.  Respiratory: Negative for shortness of breath.   Cardiovascular: Positive for chest pain.  Gastrointestinal: Negative for nausea and vomiting.  All other systems reviewed and are negative.    Physical Exam Updated Vital Signs BP 113/80 (BP Location: Right Arm)   Pulse 78   Temp 98.2 F (36.8 C) (Oral)   Resp 18   LMP 09/12/2017   SpO2 95%   Physical Exam  Constitutional: She is oriented to person, place, and time. She appears well-developed.  HENT:  Head: Normocephalic and atraumatic.  Eyes: EOM are normal.  Neck: Normal range of  motion. Neck supple.  Cardiovascular: Normal rate and intact distal pulses.   No murmur heard. Pulmonary/Chest: Effort normal.  Abdominal: Bowel sounds are normal. There is no tenderness.  Musculoskeletal: She exhibits no edema or tenderness.  Neurological: She is alert and oriented to person, place, and time.  Skin: Skin is warm and dry.  Nursing note and vitals reviewed.    ED Treatments / Results  Labs (all labs ordered are listed, but only abnormal results are displayed) Labs Reviewed  BASIC METABOLIC PANEL -  Abnormal; Notable for the following:       Result Value   CO2 20 (*)    Calcium 8.7 (*)    All other components within normal limits  CBC - Abnormal; Notable for the following:    Hemoglobin 11.6 (*)    HCT 33.8 (*)    All other components within normal limits  I-STAT TROPONIN, ED  POCT I-STAT TROPONIN I  I-STAT TROPONIN, ED  POCT I-STAT TROPONIN I    EKG  EKG Interpretation  Date/Time:  Wednesday September 12 2017 21:33:04 EDT Ventricular Rate:  89 PR Interval:    QRS Duration: 87 QT Interval:  359 QTC Calculation: 437 R Axis:   52 Text Interpretation:  Sinus rhythm No acute changes No significant change since last tracing Confirmed by Varney Biles [phone]) on 09/13/2017 1:25:41 AM       Radiology Dg Chest 2 View  Result Date: 09/12/2017 CLINICAL DATA:  Mid chest pain radiating to left arm beginning earlier today. EXAM: CHEST  2 VIEW COMPARISON:  06/09/2017 FINDINGS: Lungs are adequately inflated without consolidation or effusion. Cardiomediastinal silhouette is within normal. Bony structures are normal. There are prominent overlying soft tissues. IMPRESSION: No active cardiopulmonary disease. Electronically Signed   By: Marin Olp M.D.   On: 09/12/2017 22:08    Procedures Procedures (including critical care time)  Medications Ordered in ED Medications - No data to display   Initial Impression / Assessment and Plan / ED Course  I have reviewed  the triage vital signs and the nursing notes.  Pertinent labs & imaging results that were available during my care of the patient were reviewed by me and considered in my medical decision making (see chart for details).     Differential diagnosis includes: ACS syndrome Myocarditis Pericarditis PE Musculoskeletal pain PUD / Gastritis / Esophagitis Esophageal spasm  Patient comes in with chief complaint of chest pain. Chest pain does have some typical feature, most notably pain radiating to the left side down the arm. Mixed with that are several features not typical for ACS, like intermittent pain with no walking factors that is not exertional. Patient also has family history of premature coronary artery disease. HEAR score is 2 (for history and risk factors).  We will get serial troponins. EKG is reassuring. At the time of evaluation patient was chest pain-free. Given the premature coronary artery disease in the family, but some typical features of chest pain we will still advised patient to follow up with cardiologist.Strict return precautions for chest pain have been discussed with patient.  Final Clinical Impressions(s) / ED Diagnoses   Final diagnoses:  Nonspecific chest pain  Precordial chest pain    New Prescriptions Discharge Medication List as of 09/13/2017  4:18 AM       Varney Biles, MD 09/13/17 [phone]

## 2017-09-13 NOTE — Discharge Instructions (Signed)
All of our cardiac workup is normal, including labs, EKG and chest X-RAY are normal. We are not sure what is causing your discomfort, but we feel comfortable sending you home at this time. The workup in the ER is not complete, and you should follow up with your primary care doctor for further evaluation.  Please return to the ER if you have worsening chest pain, shortness of breath, pain radiating to your jaw, shoulder, or back, sweats or fainting. Otherwise see the Cardiologist or your primary care doctor as requested.

## 2017-09-14 ENCOUNTER — Inpatient Hospital Stay (HOSPITAL_COMMUNITY)
Admission: AD | Admit: 2017-09-14 | Discharge: 2017-09-15 | Disposition: A | Discharge status: Home / Self Care | Payer: Self-pay | Source: Ambulatory Visit | Attending: Obstetrics and Gynecology | Admitting: Obstetrics and Gynecology

## 2017-09-14 ENCOUNTER — Encounter (HOSPITAL_COMMUNITY): Payer: Self-pay | Admitting: *Deleted

## 2017-09-14 DIAGNOSIS — Z7982 Long term (current) use of aspirin: Secondary | ICD-10-CM | POA: Insufficient documentation

## 2017-09-14 DIAGNOSIS — Z87891 Personal history of nicotine dependence: Secondary | ICD-10-CM | POA: Insufficient documentation

## 2017-09-14 DIAGNOSIS — Z880 Allergy status to penicillin: Secondary | ICD-10-CM | POA: Insufficient documentation

## 2017-09-14 DIAGNOSIS — Z3202 Encounter for pregnancy test, result negative: Secondary | ICD-10-CM | POA: Insufficient documentation

## 2017-09-14 DIAGNOSIS — N939 Abnormal uterine and vaginal bleeding, unspecified: Secondary | ICD-10-CM | POA: Insufficient documentation

## 2017-09-14 LAB — POCT PREGNANCY, URINE: PREG TEST UR: NEGATIVE

## 2017-09-14 NOTE — MAU Provider Note (Signed)
History     CSN: [id number]  Arrival date and time: 09/14/17 2310   First Provider Initiated Contact with Patient 09/14/17 2343      Chief Complaint  Patient presents with  . Vaginal Bleeding   Nicole [name] is 41 y.o. [id number] who presents today with vaginal bleeding. She has had a BTL and she was on depo. She reports that her last depo was in March. She only stopped that due to insurance reasons, and she would like to continue being on depo.    Vaginal Bleeding  The patient's primary symptoms include vaginal bleeding. The patient's pertinent negatives include no pelvic pain or vaginal discharge. This is a new problem. Episode onset: sometime during the week of 07/23/17  The problem occurs constantly. The problem has been unchanged. Pain severity now: 7/10. The problem affects both sides. She is not pregnant. Pertinent negatives include no chills, dysuria, fever, frequency, nausea, urgency or vomiting. The vaginal discharge was normal. The vaginal bleeding is typical of menses. She has been passing clots (about the size of a grape ). She has not been passing tissue. Nothing aggravates the symptoms. She has tried nothing for the symptoms. She uses tubal ligation (Last depo shot was in March. ) for contraception. Her menstrual history has been irregular (after stopping depo periods have always been irregular. ).    Past Medical History:  Diagnosis Date  . Anemia   . Asthma   . Atypical chest pain    a. ?pericarditis 2015.  Marland Kitchen Blood transfusion 2011   r/t anemia  . Fibroid   . Migraine   . Seizures ([place])   . Sickle cell trait ([place])   . Tooth abscess     Past Surgical History:  Procedure Laterality Date  . TUBAL LIGATION Bilateral 2005    Family History  Problem Relation Age of Onset  . Hypertension Mother   . Diabetes Mother   . Cancer Mother        Colon, brain, 2 other  . Asthma Mother   . Clotting disorder Mother   . Heart disease Mother        59, stents  . Stroke  Mother        2012  . Diabetes Father   . Asthma Father   . Clotting disorder Father   . Sickle cell anemia Father   . Heart disease Father        2000, stents  . Hypertension Father   . Stroke Father        2007  . Diabetes Maternal Grandmother   . Asthma Sister   . Asthma Brother   . Asthma Paternal Grandmother   . Anesthesia problems Neg Hx     Social History  Substance Use Topics  . Smoking status: Former Smoker    Quit date: 03/27/2014  . Smokeless tobacco: Never Used  . Alcohol use No    Allergies:  Allergies  Allergen Reactions  . Penicillins Anaphylaxis and Swelling    Has patient had a PCN reaction causing immediate rash, facial/tongue/throat swelling, SOB or lightheadedness with hypotension: Yes Has patient had a PCN reaction causing severe rash involving mucus membranes or skin necrosis: Yes Has patient had a PCN reaction that required hospitalization Yes- ed visit Has patient had a PCN reaction occurring within the last 10 years: No-childhood allergy If all of the above answers are "NO", then may proceed with Cephalosporin use.   . Shrimp [Shellfish Allergy] Anaphylaxis  . Tea Anaphylaxis  All teas  . Sulfa Antibiotics Rash    Prescriptions Prior to Admission  Medication Sig Dispense Refill Last Dose  . acetaminophen (TYLENOL) 500 MG tablet Take 1 tablet (500 mg total) by mouth every 6 (six) hours as needed. (Patient taking differently: Take 1,000 mg by mouth every 6 (six) hours as needed for moderate pain. ) 14 tablet 0 Past Week at Unknown time  . aspirin 81 MG chewable tablet Chew 81 mg by mouth once.   09/13/2017 at Unknown time  . cetirizine (ZYRTEC) 10 MG tablet Take 10 mg by mouth daily.   09/14/2017 at Unknown time  . clonazePAM (KLONOPIN) 0.5 MG tablet Take 1 tablet (0.5 mg total) by mouth 2 (two) times daily. 6 tablet 0 09/14/2017 at Unknown time  . cyclobenzaprine (FLEXERIL) 10 MG tablet Take 1 tablet (10 mg total) by mouth at bedtime. 10 tablet 0  09/14/2017 at Unknown time  . FLUoxetine (PROZAC) 20 MG capsule Take 1 capsule (20 mg total) by mouth daily. 30 capsule 3 09/14/2017 at Unknown time  . gabapentin (NEURONTIN) 300 MG capsule Take 1 capsule (300 mg total) by mouth 3 (three) times daily. (Patient taking differently: Take 300-1,200 mg by mouth 3 (three) times daily. Takes one capsule twice daily and then takes four capsules at bedtime.) 90 capsule 0 09/14/2017 at Unknown time  . ibuprofen (ADVIL,MOTRIN) 600 MG tablet Take 1 tablet (600 mg total) by mouth every 6 (six) hours as needed. (Patient taking differently: Take 600 mg by mouth every 6 (six) hours as needed for moderate pain. ) 30 tablet 0 09/14/2017 at Unknown time  . levETIRAcetam (KEPPRA) 1000 MG tablet Take 1 tablet (1,000 mg total) by mouth 2 (two) times daily. 60 tablet 3 09/14/2017 at Unknown time  . meloxicam (MOBIC) 7.5 MG tablet Take 1 tablet (7.5 mg total) by mouth 2 (two) times daily. 30 tablet 0 09/14/2017 at Unknown time  . methocarbamol (ROBAXIN) 500 MG tablet Take 1 tablet (500 mg total) by mouth 2 (two) times daily. 20 tablet 0 09/14/2017 at Unknown time  . topiramate (TOPAMAX) 25 MG tablet TAKE 3 TABLETS BY MOUTH DAILY (Patient taking differently: TAKE 75mg  BY MOUTH TWICE DAILY) 90 tablet 3 09/14/2017 at Unknown time  . medroxyPROGESTERone (DEPO-PROVERA) 150 MG/ML injection Inject 1 mL (150 mg total) into the muscle every 3 (three) months. Inject on  the 22nd of each month 1 mL 3 Past Month at Unknown time    Review of Systems  Constitutional: Negative for chills and fever.  Gastrointestinal: Negative for nausea and vomiting.  Genitourinary: Positive for menstrual problem and vaginal bleeding. Negative for dysuria, frequency, pelvic pain, urgency and vaginal discharge.   Physical Exam   Blood pressure 114/79, pulse 76, temperature 98 F (36.7 C), resp. rate 16, last menstrual period 09/12/2017.  Physical Exam  Nursing note and vitals reviewed. Constitutional: She  is oriented to person, place, and time. She appears well-developed and well-nourished. No distress.  HENT:  Head: Normocephalic.  Cardiovascular: Normal rate.   Respiratory: Effort normal.  GI: Soft. There is no tenderness. There is no rebound.  Neurological: She is alert and oriented to person, place, and time.  Skin: Skin is warm and dry.  Psychiatric: She has a normal mood and affect.   Results for orders placed or performed during the hospital encounter of 09/14/17 (from the past 24 hour(s))  Urinalysis, Routine w reflex microscopic     Status: Abnormal   Collection Time: 09/14/17 11:15 PM  Result  Value Ref Range   Color, Urine YELLOW YELLOW   APPearance CLEAR CLEAR   Specific Gravity, Urine 1.020 1.005 - 1.030   pH 5.0 5.0 - 8.0   Glucose, UA NEGATIVE NEGATIVE mg/dL   Hgb urine dipstick MODERATE (A) NEGATIVE   Bilirubin Urine NEGATIVE NEGATIVE   Ketones, ur NEGATIVE NEGATIVE mg/dL   Protein, ur NEGATIVE NEGATIVE mg/dL   Nitrite NEGATIVE NEGATIVE   Leukocytes, UA TRACE (A) NEGATIVE   RBC / HPF 0-5 0 - 5 RBC/hpf   WBC, UA 0-5 0 - 5 WBC/hpf   Bacteria, UA NONE SEEN NONE SEEN   Squamous Epithelial / LPF 0-5 (A) NONE SEEN   Mucus PRESENT   Pregnancy, urine POC     Status: None   Collection Time: 09/14/17 11:51 PM  Result Value Ref Range   Preg Test, Ur NEGATIVE NEGATIVE  CBC     Status: Abnormal   Collection Time: 09/15/17 12:10 AM  Result Value Ref Range   WBC 7.8 4.0 - 10.5 K/uL   RBC 3.96 3.87 - 5.11 MIL/uL   Hemoglobin 11.4 (L) 12.0 - 15.0 g/dL   HCT 33.2 (L) 36.0 - 46.0 %   MCV 83.8 78.0 - 100.0 fL   MCH 28.8 26.0 - 34.0 pg   MCHC 34.3 30.0 - 36.0 g/dL   RDW 13.5 11.5 - 15.5 %   Platelets 237 150 - 400 K/uL    MAU Course  Procedures  MDM Depo given today per patient request.   Assessment and Plan   1. Abnormal uterine and vaginal bleeding, unspecified    DC home Comfort measures reviewed  Bleeding precautions RX: none  Return to MAU as needed FU  with OB as planned  Follow-up Information    Department, [facility]. Schedule an appointment as soon as possible for a visit.   Why:  NEXT DEPO DUE 12/01/17-12/15/17 Contact information: [place] [address] [place] 09/14/2017, 11:44 PM

## 2017-09-14 NOTE — MAU Note (Signed)
Pt reports bleeding for 1 months and 2 weeks. Pt rates cramping pain 7/10. Pt took 800 mg Motrin this morning with no relief. Pt reports that she bleeds through 3 pads per day along with grape sized clots. Pt was diagnosed with small fibroids in April. She was put on Depo and her last dose was march. Then her husband switched jobs and she didn't have any insurance and it is still pending.

## 2017-09-14 NOTE — Discharge Instructions (Signed)
In late 2019, the [facility] will be moving to the [place] campus. At that time, the MAU will no longer serve non-pregnant patients. We encourage you to establish care with a provider before that time, so that you can be seen with any GYN concerns, like vaginal discharge, urinary tract infection, etc.. in a timely manner. In order to make the office visit more convenient, the [facility] will be offering evening hours from 4pm-8pm on Mondays starting 08/20/17. There will be same-day appointments, walk-in appointments and scheduled appointments available during this time.   ° °[facility] - [phone] ° °For urgent needs, [facility] is also available for management of urgent GYN complaints such as vaginal discharge.  ° °[employer] extends eligibility for family planning services to reduce unintended pregnancies and improve the well-being of children and families. ° ° Eligible individuals whose income is at or below 195% of the federal poverty level and who are:  °- [place] citizens, documented immigrants or qualified aliens;  °- Residents of [place];  °- Not incarcerated; and  °- Not pregnant.  ° °[employer] Contact Information:  °Medical Assistance Clinical Section °Phone: [phone] °Email: dma.besmart@[facility].gov  ° ° ° °

## 2017-09-15 DIAGNOSIS — N939 Abnormal uterine and vaginal bleeding, unspecified: Secondary | ICD-10-CM

## 2017-09-15 LAB — CBC
HCT: 33.2 % — ABNORMAL LOW (ref 36.0–46.0)
HEMOGLOBIN: 11.4 g/dL — AB (ref 12.0–15.0)
MCH: 28.8 pg (ref 26.0–34.0)
MCHC: 34.3 g/dL (ref 30.0–36.0)
MCV: 83.8 fL (ref 78.0–100.0)
Platelets: 237 10*3/uL (ref 150–400)
RBC: 3.96 MIL/uL (ref 3.87–5.11)
RDW: 13.5 % (ref 11.5–15.5)
WBC: 7.8 10*3/uL (ref 4.0–10.5)

## 2017-09-15 LAB — URINALYSIS, ROUTINE W REFLEX MICROSCOPIC
Bacteria, UA: NONE SEEN
Bilirubin Urine: NEGATIVE
Glucose, UA: NEGATIVE mg/dL
KETONES UR: NEGATIVE mg/dL
Nitrite: NEGATIVE
PROTEIN: NEGATIVE mg/dL
Specific Gravity, Urine: 1.02 (ref 1.005–1.030)
pH: 5 (ref 5.0–8.0)

## 2017-09-15 MED ORDER — MEDROXYPROGESTERONE ACETATE 150 MG/ML IM SUSP
150.0000 mg | Freq: Once | INTRAMUSCULAR | Status: AC
  Administered 2017-09-15: 150 mg via INTRAMUSCULAR
  Filled 2017-09-15: qty 1

## 2017-10-31 IMAGING — DX DG HIP (WITH OR WITHOUT PELVIS) 2-3V*R*
3 series · 3 of 3 positions shown · non-contrast
Comparison: None.

CLINICAL DATA: Seizures and complains of pain in the right hip and
knee.

EXAM:
DG HIP (WITH OR WITHOUT PELVIS) 2-3V RIGHT

[t pelvis ap]
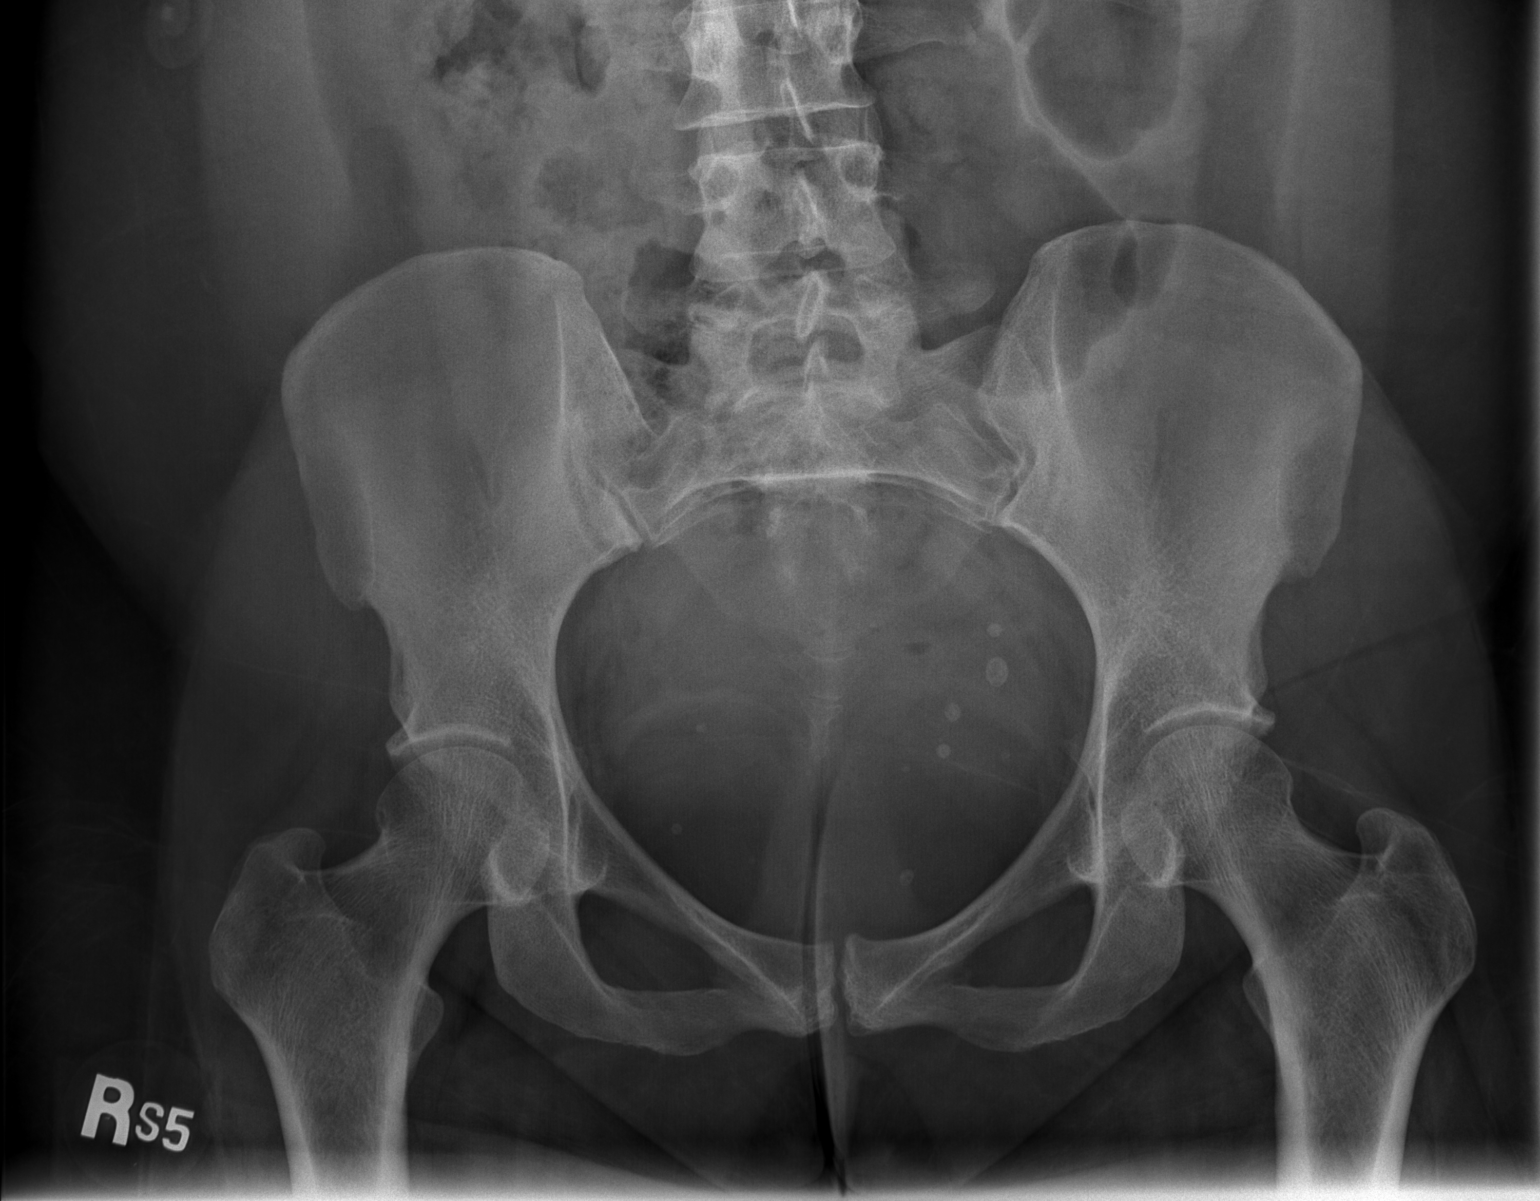

[t hip ap right]
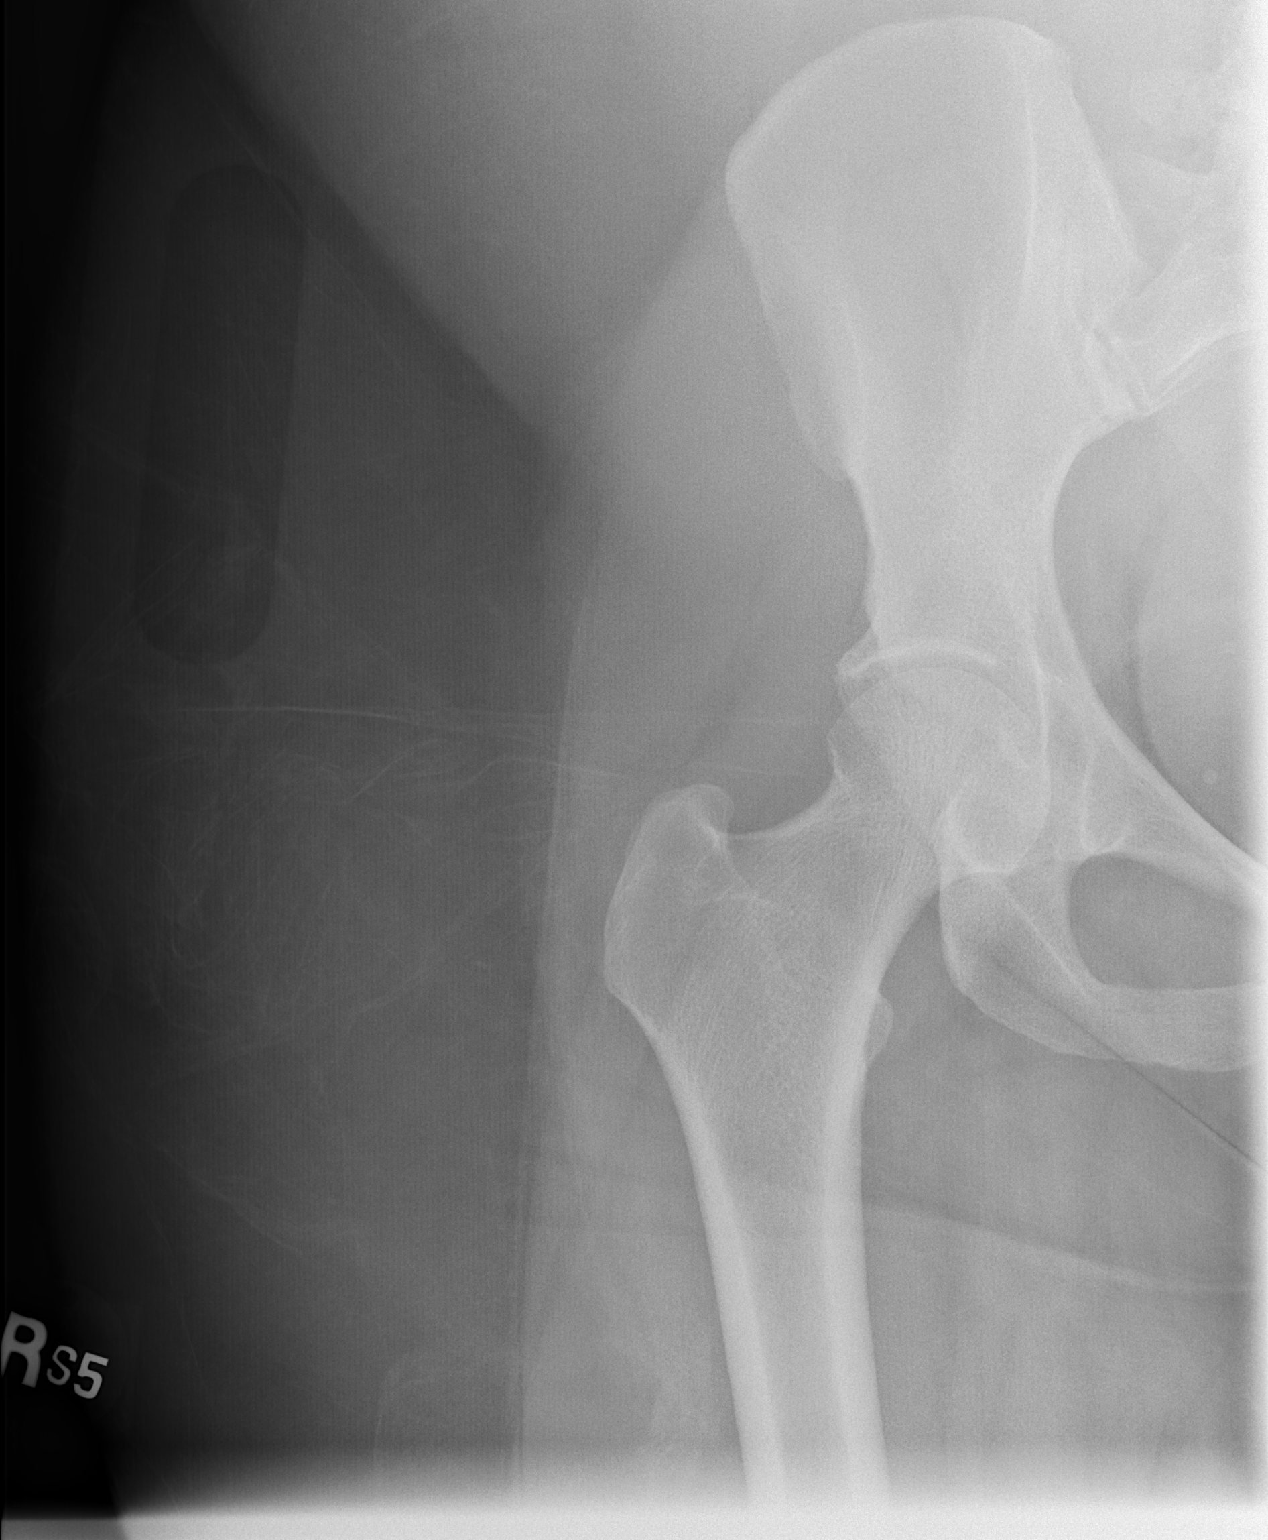

[t hip frog leg right]
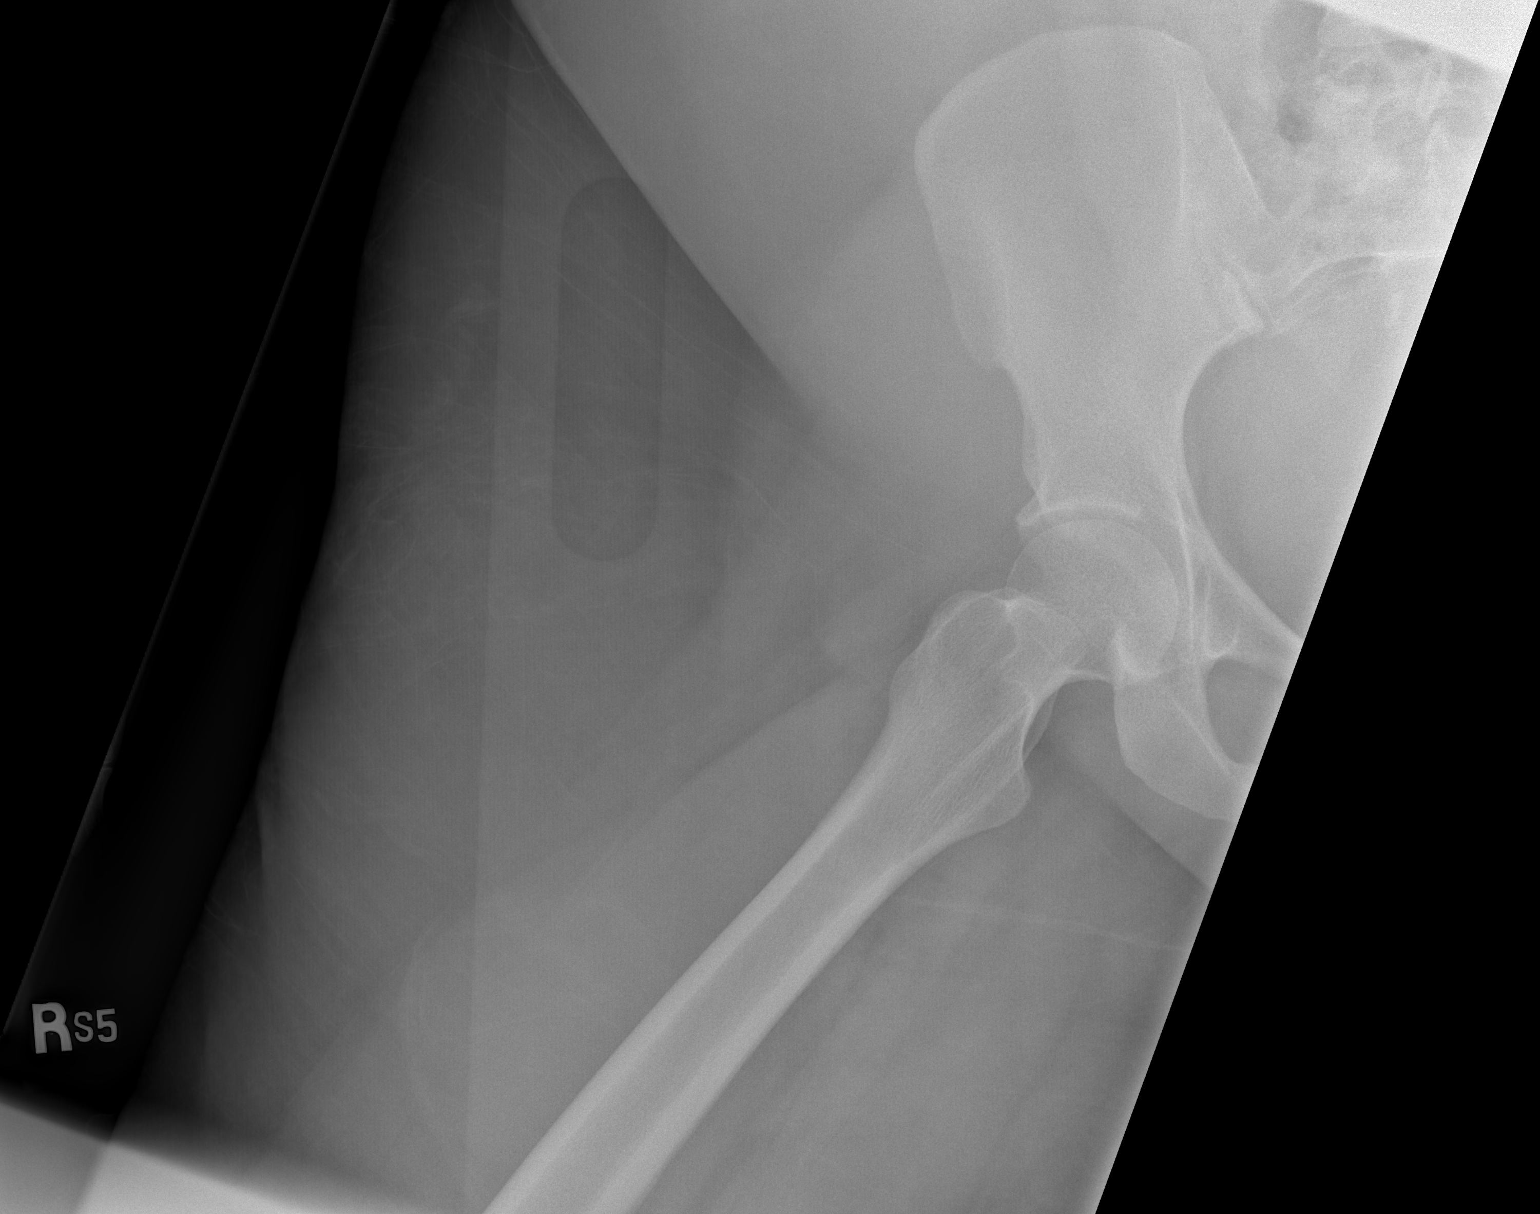

[3 of 3 positions shown; findings below may reference images not displayed]

FINDINGS: Multiple phleboliths in the pelvis. The pelvic bony ring is intact.
SI joints are within normal limits. Right hip is located without a
fracture. No significant joint space narrowing in either hip. Mild
sclerosis and degenerative changes at the pubic symphysis.
IMPRESSION: No acute abnormality to the pelvis or right hip.

## 2017-10-31 IMAGING — DX DG KNEE 1-2V*R*
2 series · 2 of 2 positions shown · non-contrast
Comparison: None.

CLINICAL DATA: Patient status post seizure. Knee pain. Initial
encounter.

EXAM:
RIGHT KNEE - 1-2 VIEW

[t knee lat right]
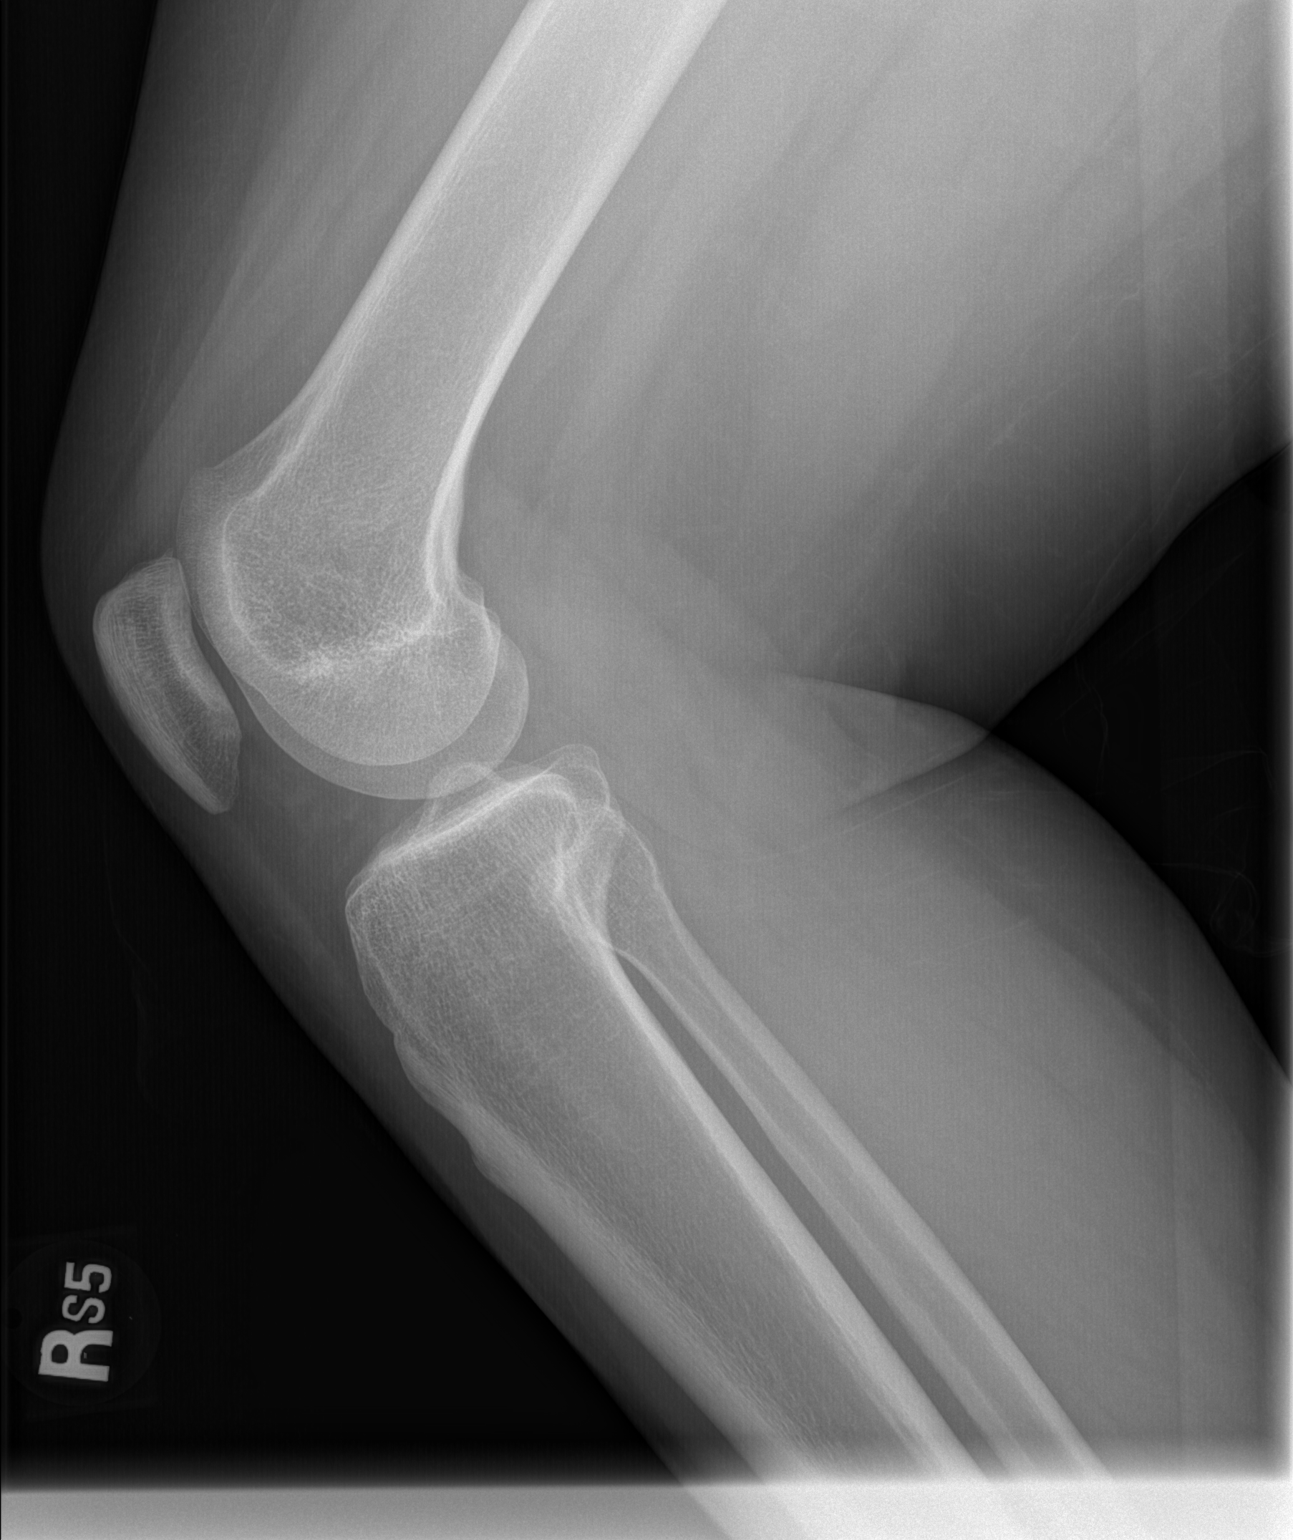

[t knee ap right]
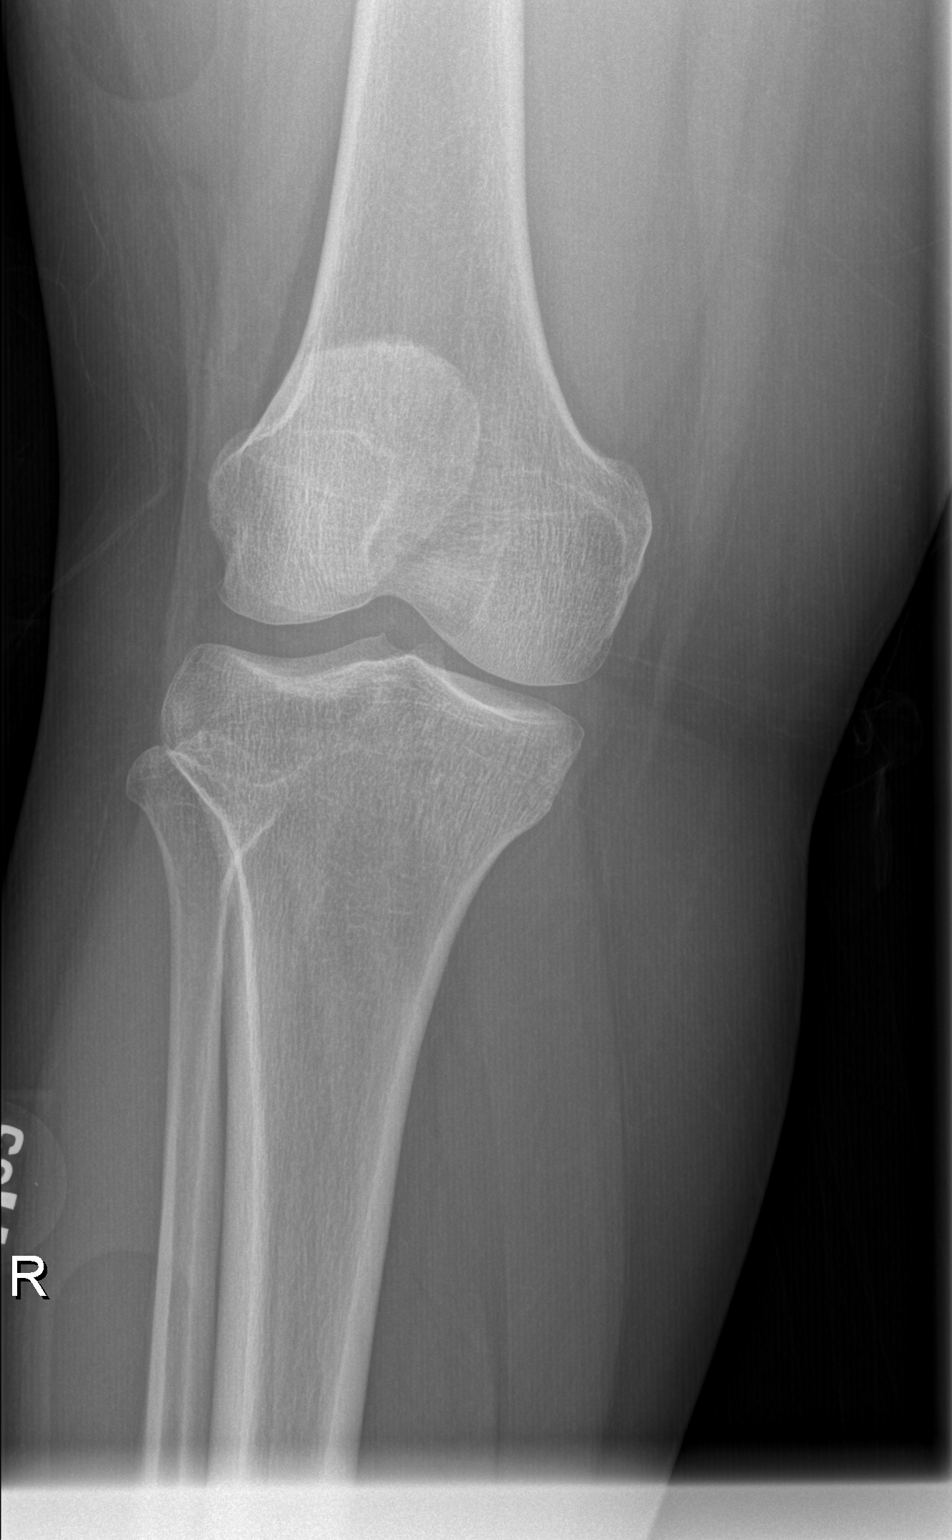

[2 of 2 positions shown; findings below may reference images not displayed]

FINDINGS: Normal anatomic alignment. No evidence for acute fracture or
dislocation. Regional soft tissues are unremarkable.
IMPRESSION: No acute osseus abnormality.

## 2017-11-28 ENCOUNTER — Other Ambulatory Visit: Payer: Self-pay | Admitting: Obstetrics

## 2017-11-28 ENCOUNTER — Telehealth: Payer: Self-pay | Admitting: Pediatrics

## 2017-11-28 DIAGNOSIS — Z3042 Encounter for surveillance of injectable contraceptive: Secondary | ICD-10-CM

## 2017-11-28 MED ORDER — MEDROXYPROGESTERONE ACETATE 150 MG/ML IM SUSP
150.0000 mg | INTRAMUSCULAR | 3 refills | Status: DC
Start: 2017-11-28 — End: 2018-02-15

## 2017-11-28 NOTE — Telephone Encounter (Signed)
Depo Provera Rx Need to schedule Annual.

## 2017-11-28 NOTE — Telephone Encounter (Signed)
OK to give depo early? Last inj was 09/15/17.

## 2017-11-28 NOTE — Telephone Encounter (Signed)
Pt called office.  She c/o heavy vaginal bleeding.  She is requesting a refill on Depo Provera inj.  She states depo usually controls her bleeding well.  She states she was seen at [facility] 09/15/17 for bleeding, was given Depo inj there.  She reports clots and abdominal cramping as well.  Pt states ibuprofen helps cramping, is out of rx. I advised to take 3-4 OTC ibuprofen every 6-8 hours as needed for pain, with food. I also advised since last depo injection was <12 weeks ago (09/15/17) I was not sure that Dr. Jodi Mourning would refill.  Pt needs AEX as well-last 09/29/16.  Please advise.

## 2017-11-29 ENCOUNTER — Ambulatory Visit (INDEPENDENT_AMBULATORY_CARE_PROVIDER_SITE_OTHER): Payer: BLUE CROSS/BLUE SHIELD

## 2017-11-29 ENCOUNTER — Ambulatory Visit: Payer: BLUE CROSS/BLUE SHIELD | Admitting: Certified Nurse Midwife

## 2017-11-29 ENCOUNTER — Encounter: Payer: Self-pay | Admitting: Certified Nurse Midwife

## 2017-11-29 VITALS — BP 119/86 | HR 71 | Ht 65.0 in | Wt 240.4 lb

## 2017-11-29 DIAGNOSIS — Z3042 Encounter for surveillance of injectable contraceptive: Secondary | ICD-10-CM | POA: Diagnosis not present

## 2017-11-29 MED ORDER — MEDROXYPROGESTERONE ACETATE 150 MG/ML IM SUSP
150.0000 mg | INTRAMUSCULAR | Status: DC
  Administered 2017-11-29: 150 mg via INTRAMUSCULAR

## 2017-11-29 NOTE — Progress Notes (Signed)
Patient is in the office, complains of abdominal pain and spotting that started a few days ago. Pt is currently on depo, last inj 09-15-17. Pt reports that she normally has to get injection a little bit early so that she does not have pain and bleeding.

## 2017-11-29 NOTE — Telephone Encounter (Signed)
OK to give Depo early.

## 2017-11-29 NOTE — Progress Notes (Signed)
Administered depo injection and pt tolerated well .Marland Kitchen Administrations This Visit    medroxyPROGESTERone (DEPO-PROVERA) injection 150 mg    Admin Date 11/29/2017 Action Given Dose 150 mg Route Intramuscular Administered By Hinton Lovely, RN

## 2017-11-29 NOTE — Telephone Encounter (Signed)
Pt on your schedule today. FYI

## 2017-11-29 NOTE — Telephone Encounter (Signed)
LMTCB

## 2017-11-30 NOTE — Progress Notes (Signed)
Agree with nursing staff's documentation of this patient's clinic encounter.  Morene Crocker, CNM 11/30/2017 8:00 AM

## 2017-12-20 ENCOUNTER — Ambulatory Visit: Payer: BLUE CROSS/BLUE SHIELD | Admitting: Obstetrics

## 2018-01-01 ENCOUNTER — Encounter (HOSPITAL_COMMUNITY): Payer: Self-pay

## 2018-01-01 ENCOUNTER — Emergency Department (HOSPITAL_COMMUNITY)
Admission: EM | Admit: 2018-01-01 | Discharge: 2018-01-01 | Disposition: A | Discharge status: Home / Self Care | Payer: Self-pay | Attending: Emergency Medicine | Admitting: Emergency Medicine

## 2018-01-01 ENCOUNTER — Emergency Department (HOSPITAL_COMMUNITY): Payer: Self-pay

## 2018-01-01 ENCOUNTER — Other Ambulatory Visit: Payer: Self-pay

## 2018-01-01 DIAGNOSIS — Z79899 Other long term (current) drug therapy: Secondary | ICD-10-CM | POA: Insufficient documentation

## 2018-01-01 DIAGNOSIS — J0100 Acute maxillary sinusitis, unspecified: Secondary | ICD-10-CM

## 2018-01-01 DIAGNOSIS — Z87891 Personal history of nicotine dependence: Secondary | ICD-10-CM | POA: Insufficient documentation

## 2018-01-01 DIAGNOSIS — J45909 Unspecified asthma, uncomplicated: Secondary | ICD-10-CM | POA: Insufficient documentation

## 2018-01-01 DIAGNOSIS — Z7982 Long term (current) use of aspirin: Secondary | ICD-10-CM | POA: Insufficient documentation

## 2018-01-01 MED ORDER — TRIAMCINOLONE ACETONIDE 55 MCG/ACT NA AERO: 2.0000 | INHALATION_SPRAY | Freq: Every day | NASAL | 12 refills | Status: DC

## 2018-01-01 MED ORDER — DOXYCYCLINE HYCLATE 100 MG PO CAPS: 100.0000 mg | ORAL_CAPSULE | Freq: Two times a day (BID) | ORAL | 0 refills | Status: DC

## 2018-01-01 NOTE — ED Notes (Signed)
Patient transported to X-ray 

## 2018-01-01 NOTE — ED Provider Notes (Signed)
[facility] DEPT Provider Note   CSN: [id number] Arrival date & time: 01/01/18  6644     History   Chief Complaint Chief Complaint  Patient presents with  . sinus congestion  . Cough    HPI Nicole [name] is a 42 y.o. female.  42 year old female presents with 2 weeks of sinus congestion and discomfort.  Symptoms have been localized to her frontal as well as maxillary sinuses.  Has noted drainage in her posterior pharynx.  No fever or chills.  Mild headache without visual changes.  No vomiting or diarrhea.  No neck pain or photophobia.  Has used over-the-counter medications without relief nothing makes her symptoms better.      Past Medical History:  Diagnosis Date  . Anemia   . Asthma   . Atypical chest pain    a. ?pericarditis 2015.  Marland Kitchen Blood transfusion 2011   r/t anemia  . Fibroid   . Migraine   . Seizures ([place])   . Sickle cell trait ([place])   . Tooth abscess     Patient Active Problem List   Diagnosis Date Noted  . 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 Depression   . Generalized abdominal pain   . Fatigue 10/28/2015  . Adjustment disorder with depressed mood 10/21/2015  . Dizziness   . Weakness 11/18/2014  . OSA (obstructive sleep apnea) 10/21/2014  . Acute rhinosinusitis 10/09/2014  . Leg pain, lateral 09/04/2014  . Migraines 08/13/2014  . Seizure ([place]) 06/04/2014  . Leiomyoma of uterus, unspecified 02/11/2014  . GERD (gastroesophageal reflux disease) 01/28/2014  . Candidiasis of vulva and vagina 01/28/2014  . Unspecified symptom associated with female genital organs 01/28/2014  . Chest pain 01/20/2014  . Acute pericarditis, unspecified 01/20/2014    Past Surgical History:  Procedure Laterality Date  . TUBAL LIGATION Bilateral 2005    OB History    Gravida Para Term Preterm AB Living   3 2 1 1 1 2    SAB TAB Ectopic Multiple Live Births   0 0 1 0 2       Home Medications    Prior to Admission medications   Medication  Sig Start Date End Date Taking? Authorizing Provider  acetaminophen (TYLENOL) 500 MG tablet Take 1 tablet (500 mg total) by mouth every 6 (six) hours as needed. 09/27/15   Gloriann Loan, PA-C  aspirin 81 MG chewable tablet Chew 81 mg by mouth once.    [provider]  cetirizine (ZYRTEC) 10 MG tablet Take 10 mg by mouth daily.    [provider]  clonazePAM (KLONOPIN) 0.5 MG tablet Take 1 tablet (0.5 mg total) by mouth 2 (two) times daily. 11/05/16   Montine Circle, PA-C  cyclobenzaprine (FLEXERIL) 10 MG tablet Take 1 tablet (10 mg total) by mouth at bedtime. 05/11/17   Fawze, Mina A, PA-C  FLUoxetine (PROZAC) 20 MG capsule Take 1 capsule (20 mg total) by mouth daily. 10/28/15   Haney, Amedeo Plenty, MD  gabapentin (NEURONTIN) 300 MG capsule Take 1 capsule (300 mg total) by mouth 3 (three) times daily. Patient taking differently: Take 300-1,200 mg by mouth 3 (three) times daily. Takes one capsule twice daily and then takes four capsules at bedtime. 01/25/15   Tanna Furry, MD  ibuprofen (ADVIL,MOTRIN) 600 MG tablet Take 1 tablet (600 mg total) by mouth every 6 (six) hours as needed. Patient not taking: Reported on 11/29/2017 06/09/17   Varney Biles, MD  levETIRAcetam (KEPPRA) 1000 MG tablet Take 1 tablet (  1,000 mg total) by mouth 2 (two) times daily. 08/19/17   Antonietta Breach, PA-C  medroxyPROGESTERone (DEPO-PROVERA) 150 MG/ML injection Inject 1 mL (150 mg total) into the muscle every 3 (three) months. Inject on  the 22nd of each month 11/28/17   Shelly Bombard, MD  meloxicam (MOBIC) 7.5 MG tablet Take 1 tablet (7.5 mg total) by mouth 2 (two) times daily. 05/11/17   Fawze, Mina A, PA-C  methocarbamol (ROBAXIN) 500 MG tablet Take 1 tablet (500 mg total) by mouth 2 (two) times daily. 02/06/17   Joy, Shawn C, PA-C  topiramate (TOPAMAX) 25 MG tablet TAKE 3 TABLETS BY MOUTH DAILY Patient taking differently: TAKE 75mg  BY MOUTH TWICE DAILY 03/07/16   Leone Brand, MD    Family History Family  History  Problem Relation Age of Onset  . Hypertension Mother   . Diabetes Mother   . Cancer Mother        Colon, brain, 2 other  . Asthma Mother   . Clotting disorder Mother   . Heart disease Mother        41, stents  . Stroke Mother        2012  . Diabetes Father   . Asthma Father   . Clotting disorder Father   . Sickle cell anemia Father   . Heart disease Father        2000, stents  . Hypertension Father   . Stroke Father        2007  . Diabetes Maternal Grandmother   . Asthma Sister   . Asthma Brother   . Asthma Paternal Grandmother   . Anesthesia problems Neg Hx     Social History Social History   Tobacco Use  . Smoking status: Former Smoker    Last attempt to quit: 03/27/2014    Years since quitting: 3.7  . Smokeless tobacco: Never Used  Substance Use Topics  . Alcohol use: No  . Drug use: No     Allergies   Penicillins; Shrimp [shellfish allergy]; Tea; and Sulfa antibiotics   Review of Systems Review of Systems  All other systems reviewed and are negative.    Physical Exam Updated Vital Signs BP (!) 131/101 (BP Location: Right Arm)   Pulse 82   Temp 98.3 F (36.8 C) (Oral)   Resp 16   Ht 1.651 m (5\' 5" )   Wt 112.9 kg (249 lb)   LMP 12/18/2017 (Exact Date)   SpO2 99%   BMI 41.44 kg/m   Physical Exam  Constitutional: She is oriented to person, place, and time. She appears well-developed and well-nourished.  Non-toxic appearance. No distress.  HENT:  Head: Normocephalic and atraumatic.  Nose: Right sinus exhibits maxillary sinus tenderness and frontal sinus tenderness. Left sinus exhibits maxillary sinus tenderness and frontal sinus tenderness.  Mouth/Throat: Uvula is midline, oropharynx is clear and moist and mucous membranes are normal.  Eyes: Conjunctivae, EOM and lids are normal. Pupils are equal, round, and reactive to light.  Neck: Normal range of motion. Neck supple. No tracheal deviation present. No thyroid mass present.    Cardiovascular: Normal rate, regular rhythm and normal heart sounds. Exam reveals no gallop.  No murmur heard. Pulmonary/Chest: Effort normal and breath sounds normal. No stridor. No respiratory distress. She has no decreased breath sounds. She has no wheezes. She has no rhonchi. She has no rales.  Abdominal: Soft. Normal appearance and bowel sounds are normal. She exhibits no distension. There is no tenderness. There is no  rebound and no CVA tenderness.  Musculoskeletal: Normal range of motion. She exhibits no edema or tenderness.  Neurological: She is alert and oriented to person, place, and time. She has normal strength. No cranial nerve deficit or sensory deficit. GCS eye subscore is 4. GCS verbal subscore is 5. GCS motor subscore is 6.  Skin: Skin is warm and dry. No abrasion and no rash noted.  Psychiatric: She has a normal mood and affect. Her speech is normal and behavior is normal.  Nursing note and vitals reviewed.    ED Treatments / Results  Labs (all labs ordered are listed, but only abnormal results are displayed) Labs Reviewed - No data to display  EKG  EKG Interpretation None       Radiology Dg Chest 2 View  Result Date: 01/01/2018 CLINICAL DATA:  Cough, congestion EXAM: CHEST  2 VIEW COMPARISON:  09/12/2017 FINDINGS: Heart and mediastinal contours are within normal limits. No focal opacities or effusions. No acute bony abnormality. IMPRESSION: No active cardiopulmonary disease. Electronically Signed   By: Rolm Baptise M.D.   On: 01/01/2018 10:29    Procedures Procedures (including critical care time)  Medications Ordered in ED Medications - No data to display   Initial Impression / Assessment and Plan / ED Course  I have reviewed the triage vital signs and the nursing notes.  Pertinent labs & imaging results that were available during my care of the patient were reviewed by me and considered in my medical decision making (see chart for details).      Patient to be treated for likely bacterial sinusitis with steroids as well as doxycycline  Final Clinical Impressions(s) / ED Diagnoses   Final diagnoses:  None    ED Discharge Orders    None       Lacretia Leigh, MD 01/01/18 1042

## 2018-01-01 NOTE — ED Triage Notes (Signed)
Patient states she has had sinus congestion x 3 weeks and worse overnight. Patient stated she did not want a nebulizer treatment. Patient has a productive cough with yellow sputum.

## 2018-01-07 IMAGING — CR DG CHEST 2V
2 series · 2 of 2 positions shown · non-contrast
Comparison: 09/26/2015

CLINICAL DATA: Mid chest pain through to back for 3 days, some
shortness of breath, history asthma, sickle trait

EXAM:
CHEST  2 VIEW

[w chest pa]
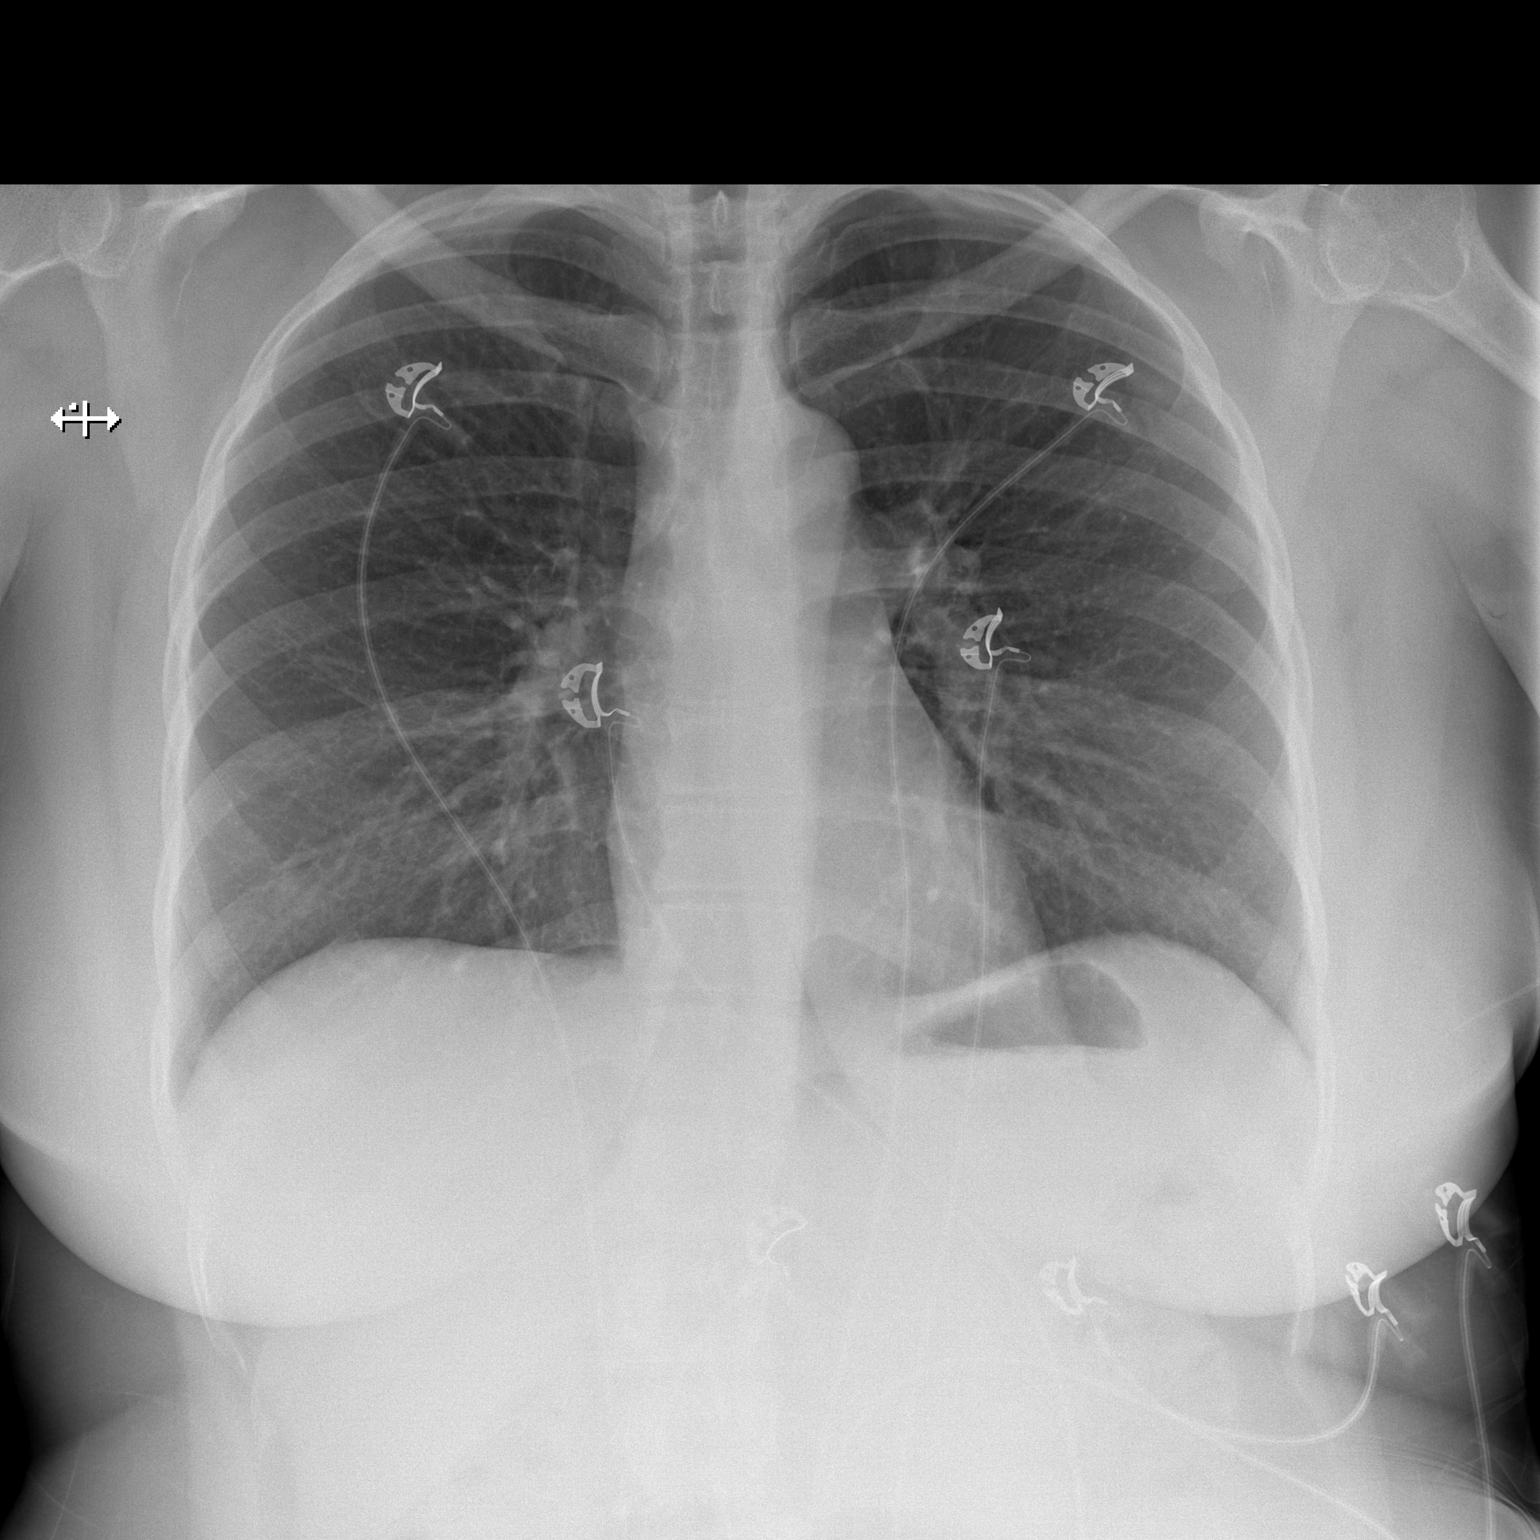

[w chest lat]
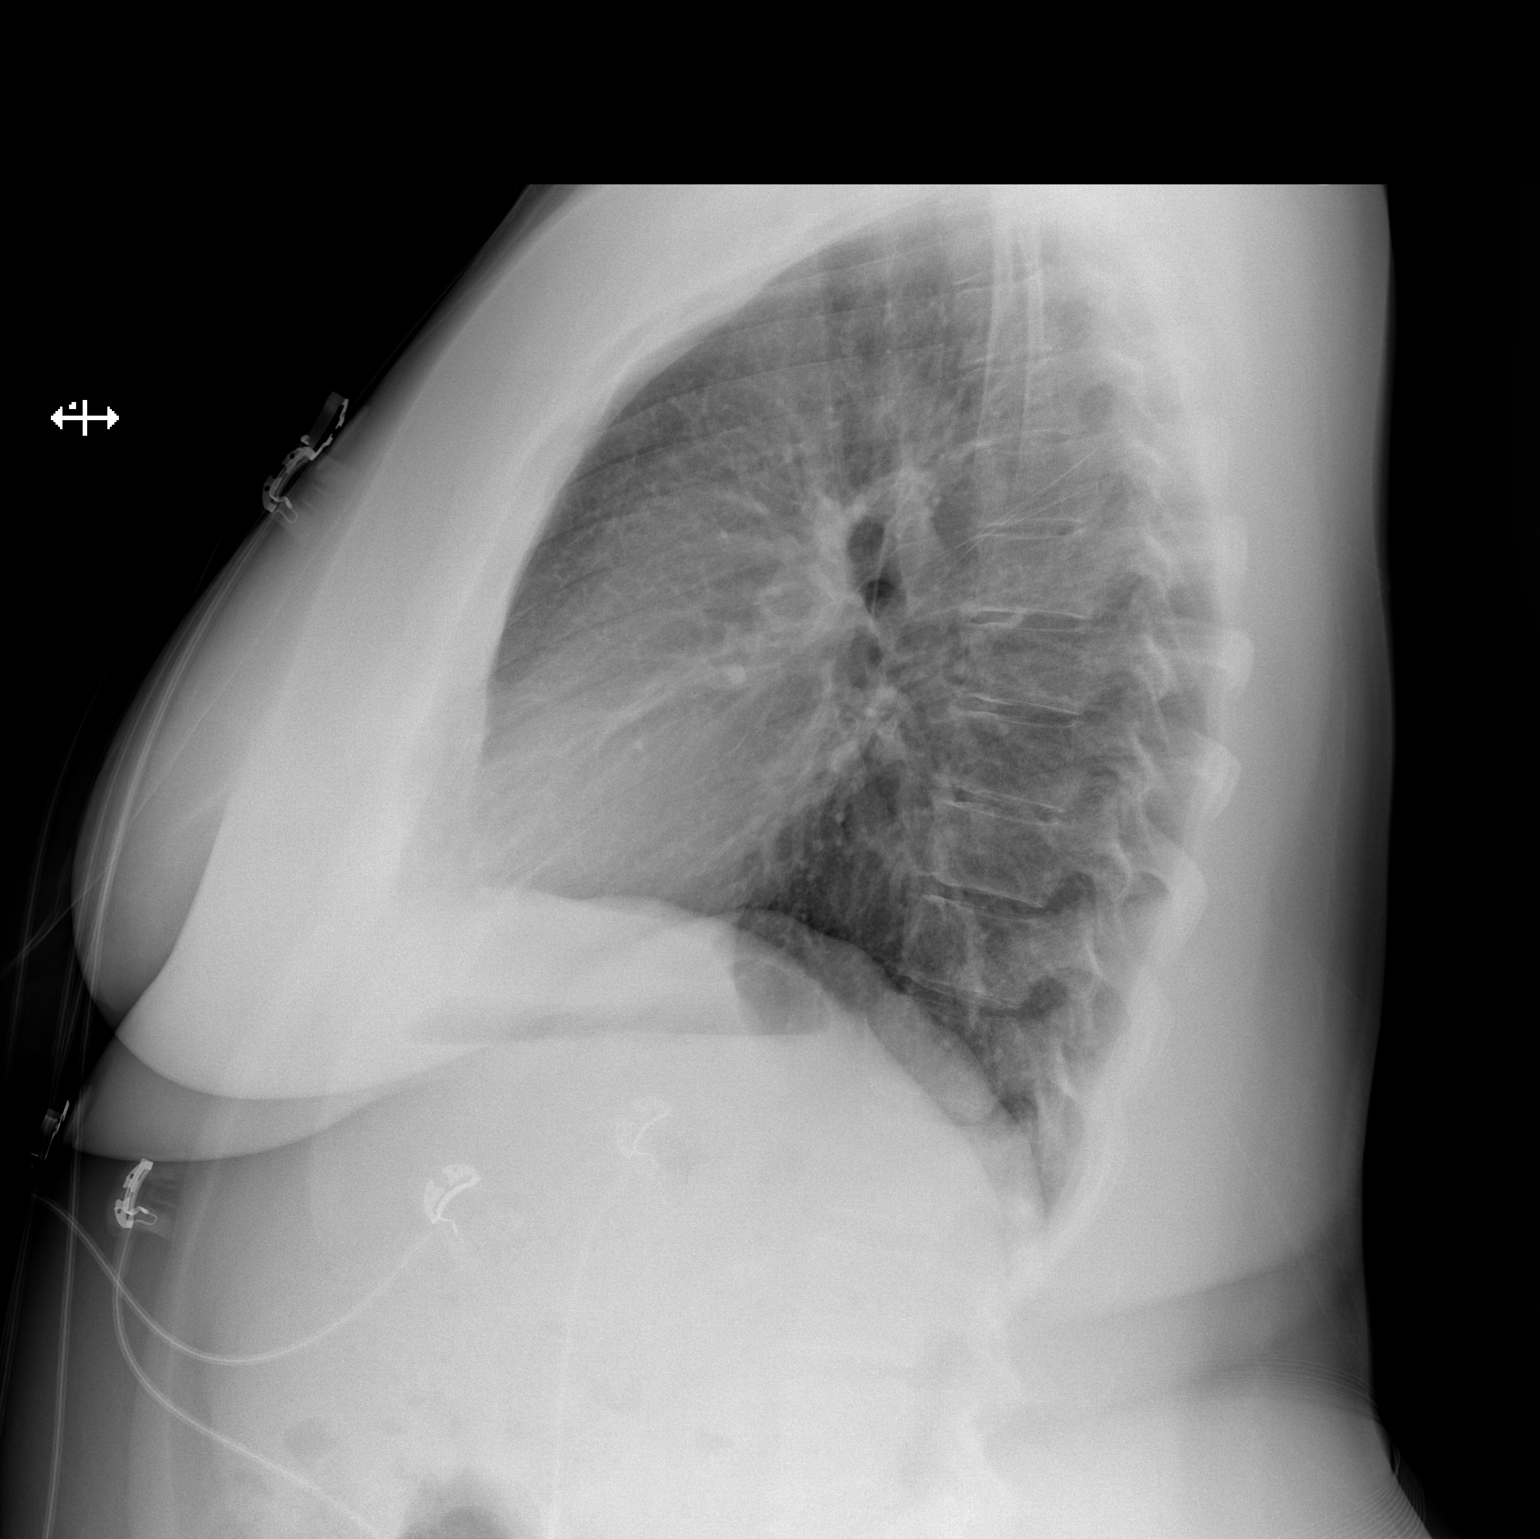

[2 of 2 positions shown; findings below may reference images not displayed]

FINDINGS: Normal heart size, mediastinal contours, and pulmonary vascularity.

Lungs clear.

No pleural effusion or pneumothorax.

Bones unremarkable.
IMPRESSION: No acute abnormalities.

## 2018-01-24 ENCOUNTER — Other Ambulatory Visit: Payer: Self-pay | Admitting: Obstetrics

## 2018-01-24 ENCOUNTER — Encounter: Payer: Self-pay | Admitting: Obstetrics

## 2018-01-24 ENCOUNTER — Ambulatory Visit (INDEPENDENT_AMBULATORY_CARE_PROVIDER_SITE_OTHER): Payer: PRIVATE HEALTH INSURANCE | Admitting: Obstetrics

## 2018-01-24 VITALS — BP 128/90 | HR 67 | Wt 251.6 lb

## 2018-01-24 DIAGNOSIS — Z01419 Encounter for gynecological examination (general) (routine) without abnormal findings: Secondary | ICD-10-CM

## 2018-01-24 DIAGNOSIS — Z6841 Body Mass Index (BMI) 40.0 and over, adult: Secondary | ICD-10-CM | POA: Diagnosis not present

## 2018-01-24 DIAGNOSIS — Z1239 Encounter for other screening for malignant neoplasm of breast: Secondary | ICD-10-CM

## 2018-01-24 DIAGNOSIS — Z01411 Encounter for gynecological examination (general) (routine) with abnormal findings: Secondary | ICD-10-CM | POA: Diagnosis not present

## 2018-01-24 DIAGNOSIS — Z1151 Encounter for screening for human papillomavirus (HPV): Secondary | ICD-10-CM

## 2018-01-24 DIAGNOSIS — Z113 Encounter for screening for infections with a predominantly sexual mode of transmission: Secondary | ICD-10-CM

## 2018-01-24 DIAGNOSIS — D251 Intramural leiomyoma of uterus: Secondary | ICD-10-CM

## 2018-01-24 DIAGNOSIS — E66813 Obesity, class 3: Secondary | ICD-10-CM

## 2018-01-24 DIAGNOSIS — N944 Primary dysmenorrhea: Secondary | ICD-10-CM

## 2018-01-24 DIAGNOSIS — Z124 Encounter for screening for malignant neoplasm of cervix: Secondary | ICD-10-CM | POA: Diagnosis not present

## 2018-01-24 DIAGNOSIS — Z3042 Encounter for surveillance of injectable contraceptive: Secondary | ICD-10-CM

## 2018-01-24 MED ORDER — IBUPROFEN 800 MG PO TABS: 800.0000 mg | ORAL_TABLET | Freq: Three times a day (TID) | ORAL | 5 refills | Status: DC | PRN

## 2018-01-24 NOTE — Progress Notes (Signed)
LMP:01/16/2018 Mammogram: N/A  Last Pap:09/29/2016 WNL  Colonoscopy: N/A  Contraception: Depo PCP: Dr. Waldon Reining   STD Screening: Full Panel Desired  C/O: pelvic pain w/ fibroids.

## 2018-01-24 NOTE — Progress Notes (Signed)
Patient ID: Nicole [name], female   DOB: 10/01/1976, 42 y.o.   MRN: [id number]  Chief Complaint  Patient presents with  . Gynecologic Exam    Annual Exam     HPI Nicole [name] is a 42 y.o. female.  Complains of painful and heavy periods. HPI  Past Medical History:  Diagnosis Date  . Anemia   . Asthma   . Atypical chest pain    a. ?pericarditis 2015.  Marland Kitchen Blood transfusion 2011   r/t anemia  . Fibroid   . Migraine   . Seizures ([place])   . Sickle cell trait ([place])   . Tooth abscess     Past Surgical History:  Procedure Laterality Date  . TUBAL LIGATION Bilateral 2005    Family History  Problem Relation Age of Onset  . Hypertension Mother   . Diabetes Mother   . Cancer Mother        Colon, brain, 2 other  . Asthma Mother   . Clotting disorder Mother   . Heart disease Mother        47, stents  . Stroke Mother        2012  . Diabetes Father   . Asthma Father   . Clotting disorder Father   . Sickle cell anemia Father   . Heart disease Father        2000, stents  . Hypertension Father   . Stroke Father        2007  . Diabetes Maternal Grandmother   . Asthma Sister   . Asthma Brother   . Asthma Paternal Grandmother   . Anesthesia problems Neg Hx     Social History Social History   Tobacco Use  . Smoking status: Former Smoker    Last attempt to quit: 03/27/2014    Years since quitting: 3.8  . Smokeless tobacco: Never Used  Substance Use Topics  . Alcohol use: No  . Drug use: No    Allergies  Allergen Reactions  . Penicillins Anaphylaxis and Swelling    Has patient had a PCN reaction causing immediate rash, facial/tongue/throat swelling, SOB or lightheadedness with hypotension: Yes Has patient had a PCN reaction causing severe rash involving mucus membranes or skin necrosis: Yes Has patient had a PCN reaction that required hospitalization Yes- ed visit Has patient had a PCN reaction occurring within the last 10 years: No-childhood allergy If all of the  above answers are "NO", then may proceed with Cephalosporin use.   . Shrimp [Shellfish Allergy] Anaphylaxis  . Tea Anaphylaxis    All teas  . Sulfa Antibiotics Rash    Current Outpatient Medications  Medication Sig Dispense Refill  . acetaminophen (TYLENOL) 500 MG tablet Take 1 tablet (500 mg total) by mouth every 6 (six) hours as needed. 14 tablet 0  . aspirin 81 MG chewable tablet Chew 81 mg by mouth once.    . cetirizine (ZYRTEC) 10 MG tablet Take 10 mg by mouth daily.    . clonazePAM (KLONOPIN) 0.5 MG tablet Take 1 tablet (0.5 mg total) by mouth 2 (two) times daily. 6 tablet 0  . cyclobenzaprine (FLEXERIL) 10 MG tablet Take 1 tablet (10 mg total) by mouth at bedtime. 10 tablet 0  . doxycycline (VIBRAMYCIN) 100 MG capsule Take 1 capsule (100 mg total) by mouth 2 (two) times daily. 28 capsule 0  . FLUoxetine (PROZAC) 20 MG capsule Take 1 capsule (20 mg total) by mouth daily. 30 capsule 3  . gabapentin (NEURONTIN) 300 MG  capsule Take 1 capsule (300 mg total) by mouth 3 (three) times daily. (Patient taking differently: Take 300-1,200 mg by mouth 3 (three) times daily. Takes one capsule twice daily and then takes four capsules at bedtime.) 90 capsule 0  . levETIRAcetam (KEPPRA) 1000 MG tablet Take 1 tablet (1,000 mg total) by mouth 2 (two) times daily. 60 tablet 3  . medroxyPROGESTERone (DEPO-PROVERA) 150 MG/ML injection Inject 1 mL (150 mg total) into the muscle every 3 (three) months. Inject on  the 22nd of each month 1 mL 3  . meloxicam (MOBIC) 7.5 MG tablet Take 1 tablet (7.5 mg total) by mouth 2 (two) times daily. 30 tablet 0  . methocarbamol (ROBAXIN) 500 MG tablet Take 1 tablet (500 mg total) by mouth 2 (two) times daily. 20 tablet 0  . topiramate (TOPAMAX) 25 MG tablet TAKE 3 TABLETS BY MOUTH DAILY (Patient taking differently: TAKE 75mg  BY MOUTH TWICE DAILY) 90 tablet 3  . triamcinolone (NASACORT) 55 MCG/ACT AERO nasal inhaler Place 2 sprays into the nose daily. 1 Inhaler 12  .  ibuprofen (ADVIL,MOTRIN) 600 MG tablet Take 1 tablet (600 mg total) by mouth every 6 (six) hours as needed. (Patient not taking: Reported on 11/29/2017) 30 tablet 0   Current Facility-Administered Medications  Medication Dose Route Frequency Provider Last Rate Last Dose  . medroxyPROGESTERone (DEPO-PROVERA) injection 150 mg  150 mg Intramuscular Q90 days Kandis Cocking A, CNM   150 mg at 11/29/17 1547    Review of Systems Review of Systems Constitutional: negative for fatigue and weight loss Respiratory: negative for cough and wheezing Cardiovascular: negative for chest pain, fatigue and palpitations Gastrointestinal: negative for abdominal pain and change in bowel habits Genitourinary:negative Integument/breast: negative for nipple discharge Musculoskeletal:negative for myalgias Neurological: negative for gait problems and tremors Behavioral/Psych: negative for abusive relationship, depression Endocrine: negative for temperature intolerance      Blood pressure 128/90, pulse 67, weight 251 lb 9.6 oz (114.1 kg), last menstrual period 01/16/2018.  Physical Exam Physical Exam General:   alert  Skin:   no rash or abnormalities  Lungs:   clear to auscultation bilaterally  Heart:   regular rate and rhythm, S1, S2 normal, no murmur, click, rub or gallop  Breasts:   normal without suspicious masses, skin or nipple changes or axillary nodes  Abdomen:  normal findings: no organomegaly, soft, non-tender and no hernia  Pelvis:  External genitalia: normal general appearance Urinary system: urethral meatus normal and bladder without fullness, nontender Vaginal: normal without tenderness, induration or masses Cervix: normal appearance Adnexa: normal bimanual exam Uterus: anteverted and non-tender, normal size    50% of 20 min visit spent on counseling and coordination of care.   Data Reviewed Labs  Assessment     1. Encounter for routine gynecological examination with Papanicolaou  smear of cervix  2. Screening for cervical cancer Rx: - Cytology - PAP  3. Screening examination for STD (sexually transmitted disease) Rx: - Cervicovaginal ancillary only - Hepatitis B surface antigen - Hepatitis C antibody - HIV antibody - RPR  4. Fibroids, intramural Rx- US PELVIC COMPLETE WITH TRANSVAGINAL; Future - CBC  5. Encounter for surveillance of injectable contraceptive  6. Screening breast examination Rx: - MM DIGITAL SCREENING BILATERAL; Future  7. Primary dysmenorrhea Rx: - ibuprofen (ADVIL,MOTRIN) 800 MG tablet; Take 1 tablet (800 mg total) by mouth every 8 (eight) hours as needed.  Dispense: 30 tablet; Refill: 5  8. Class 3 severe obesity due to excess calories without serious comorbidity  with body mass index (BMI) of 40.0 to 44.9 in adult [facility]) - recommended a program of caloric reduction, exercise and behavioral modification    Plan    Follow up in 2 weeks for results  Orders Placed This Encounter  Procedures  . US PELVIC COMPLETE WITH TRANSVAGINAL    Standing Status:   Future    Standing Expiration Date:   03/25/2019    Order Specific Question:   Reason for Exam (SYMPTOM  OR DIAGNOSIS REQUIRED)    Answer:   Uterine Fibroids    Order Specific Question:   Preferred imaging location?    Answer:   [facility]  . MM Digital Screening    Standing Status:   Future    Standing Expiration Date:   03/25/2019    Order Specific Question:   Reason for Exam (SYMPTOM  OR DIAGNOSIS REQUIRED)    Answer:   Screening    Order Specific Question:   Is the patient pregnant?    Answer:   No    Order Specific Question:   Preferred imaging location?    Answer:   [facility]  . Hepatitis B surface antigen  . Hepatitis C antibody  . HIV antibody  . RPR     Shelly Bombard MD

## 2018-01-25 ENCOUNTER — Other Ambulatory Visit: Payer: Self-pay | Admitting: Obstetrics

## 2018-01-25 ENCOUNTER — Other Ambulatory Visit: Payer: PRIVATE HEALTH INSURANCE

## 2018-01-25 DIAGNOSIS — N760 Acute vaginitis: Principal | ICD-10-CM

## 2018-01-25 DIAGNOSIS — B9689 Other specified bacterial agents as the cause of diseases classified elsewhere: Secondary | ICD-10-CM

## 2018-01-25 LAB — CERVICOVAGINAL ANCILLARY ONLY
Bacterial vaginitis: POSITIVE — AB
CANDIDA VAGINITIS: NEGATIVE
Chlamydia: NEGATIVE
Neisseria Gonorrhea: NEGATIVE
TRICH (WINDOWPATH): NEGATIVE

## 2018-01-25 LAB — CYTOLOGY - PAP
DIAGNOSIS: NEGATIVE
HPV: NOT DETECTED

## 2018-01-25 MED ORDER — METRONIDAZOLE 500 MG PO TABS: 500.0000 mg | ORAL_TABLET | Freq: Two times a day (BID) | ORAL | 2 refills | Status: DC

## 2018-01-26 ENCOUNTER — Other Ambulatory Visit: Payer: Self-pay | Admitting: Obstetrics

## 2018-01-26 LAB — CBC
HEMATOCRIT: 35.2 % (ref 34.0–46.6)
Hemoglobin: 12.1 g/dL (ref 11.1–15.9)
MCH: 29.4 pg (ref 26.6–33.0)
MCHC: 34.4 g/dL (ref 31.5–35.7)
MCV: 85 fL (ref 79–97)
PLATELETS: 294 10*3/uL (ref 150–379)
RBC: 4.12 x10E6/uL (ref 3.77–5.28)
RDW: 14.4 % (ref 12.3–15.4)
WBC: 6.6 10*3/uL (ref 3.4–10.8)

## 2018-01-26 LAB — RPR: RPR Ser Ql: NONREACTIVE

## 2018-01-26 LAB — HEPATITIS C ANTIBODY

## 2018-01-26 LAB — HEPATITIS B SURFACE ANTIGEN: Hepatitis B Surface Ag: NEGATIVE

## 2018-01-26 LAB — HIV ANTIBODY (ROUTINE TESTING W REFLEX): HIV Screen 4th Generation wRfx: NONREACTIVE

## 2018-01-30 ENCOUNTER — Other Ambulatory Visit: Payer: Self-pay

## 2018-02-04 ENCOUNTER — Emergency Department (HOSPITAL_COMMUNITY)
Admission: EM | Admit: 2018-02-04 | Discharge: 2018-02-04 | Discharge status: Against Medical Advice | Payer: PRIVATE HEALTH INSURANCE

## 2018-02-04 ENCOUNTER — Other Ambulatory Visit: Payer: Self-pay

## 2018-02-04 ENCOUNTER — Encounter (HOSPITAL_COMMUNITY): Payer: Self-pay | Admitting: Emergency Medicine

## 2018-02-04 LAB — CBC WITH DIFFERENTIAL/PLATELET
BASOS ABS: 0.1 10*3/uL (ref 0.0–0.1)
Basophils Relative: 1 %
EOS PCT: 5 %
Eosinophils Absolute: 0.4 10*3/uL (ref 0.0–0.7)
HCT: 37.4 % (ref 36.0–46.0)
Hemoglobin: 12.6 g/dL (ref 12.0–15.0)
Lymphocytes Relative: 44 %
Lymphs Abs: 3.4 10*3/uL (ref 0.7–4.0)
MCH: 29.6 pg (ref 26.0–34.0)
MCHC: 33.7 g/dL (ref 30.0–36.0)
MCV: 88 fL (ref 78.0–100.0)
Monocytes Absolute: 0.3 10*3/uL (ref 0.1–1.0)
Monocytes Relative: 5 %
Neutro Abs: 3.3 10*3/uL (ref 1.7–7.7)
Neutrophils Relative %: 45 %
PLATELETS: 294 10*3/uL (ref 150–400)
RBC: 4.25 MIL/uL (ref 3.87–5.11)
RDW: 13.4 % (ref 11.5–15.5)
WBC: 7.5 10*3/uL (ref 4.0–10.5)

## 2018-02-04 LAB — BASIC METABOLIC PANEL
Anion gap: 10 (ref 5–15)
BUN: 6 mg/dL (ref 6–20)
CO2: 20 mmol/L — ABNORMAL LOW (ref 22–32)
Calcium: 8.9 mg/dL (ref 8.9–10.3)
Chloride: 108 mmol/L (ref 101–111)
Creatinine, Ser: 0.71 mg/dL (ref 0.44–1.00)
GFR calc Af Amer: >60 mL/min (ref >60)
Glucose, Bld: 121 mg/dL — ABNORMAL HIGH (ref 65–99)
POTASSIUM: 3.3 mmol/L — AB (ref 3.5–5.1)
SODIUM: 138 mmol/L (ref 135–145)

## 2018-02-04 LAB — I-STAT TROPONIN, ED: Troponin i, poc: 0 ng/mL (ref 0.00–0.08)

## 2018-02-04 NOTE — ED Notes (Signed)
Pt sts she is in too much pain to wait in the lobby.  Pt handed writer her labels and sts she is leaving.  RN notified.

## 2018-02-04 NOTE — ED Triage Notes (Signed)
Pt arriving with chest pain that began earlier today. Pt states the pain has been "off and on" for the last couple weeks. Pt states the pain is more present when she exhales. Pt reports the pain is on the right side and radiates down her right arm. Pt also having a headache. Pt reports history of pericarditis.

## 2018-02-05 ENCOUNTER — Encounter (HOSPITAL_COMMUNITY): Payer: Self-pay | Admitting: *Deleted

## 2018-02-05 ENCOUNTER — Emergency Department (HOSPITAL_COMMUNITY): Payer: No Typology Code available for payment source

## 2018-02-05 ENCOUNTER — Emergency Department (HOSPITAL_COMMUNITY)
Admission: EM | Admit: 2018-02-05 | Discharge: 2018-02-05 | Disposition: A | Discharge status: Home / Self Care | Payer: No Typology Code available for payment source | Attending: Emergency Medicine | Admitting: Emergency Medicine

## 2018-02-05 ENCOUNTER — Other Ambulatory Visit: Payer: Self-pay

## 2018-02-05 DIAGNOSIS — R079 Chest pain, unspecified: Secondary | ICD-10-CM | POA: Diagnosis not present

## 2018-02-05 DIAGNOSIS — Z5321 Procedure and treatment not carried out due to patient leaving prior to being seen by health care provider: Secondary | ICD-10-CM | POA: Diagnosis not present

## 2018-02-05 LAB — BASIC METABOLIC PANEL
ANION GAP: 10 (ref 5–15)
CHLORIDE: 109 mmol/L (ref 101–111)
CO2: 20 mmol/L — ABNORMAL LOW (ref 22–32)
Calcium: 8.7 mg/dL — ABNORMAL LOW (ref 8.9–10.3)
Creatinine, Ser: 0.67 mg/dL (ref 0.44–1.00)
Glucose, Bld: 93 mg/dL (ref 65–99)
Potassium: 3.6 mmol/L (ref 3.5–5.1)
SODIUM: 139 mmol/L (ref 135–145)

## 2018-02-05 LAB — CBC
HEMATOCRIT: 35.4 % — AB (ref 36.0–46.0)
Hemoglobin: 12.1 g/dL (ref 12.0–15.0)
MCH: 29.7 pg (ref 26.0–34.0)
MCHC: 34.2 g/dL (ref 30.0–36.0)
MCV: 86.8 fL (ref 78.0–100.0)
Platelets: 283 10*3/uL (ref 150–400)
RBC: 4.08 MIL/uL (ref 3.87–5.11)
RDW: 13.1 % (ref 11.5–15.5)
WBC: 7.8 10*3/uL (ref 4.0–10.5)

## 2018-02-05 LAB — I-STAT TROPONIN, ED: Troponin i, poc: 0 ng/mL (ref 0.00–0.08)

## 2018-02-05 LAB — HCG, QUANTITATIVE, PREGNANCY

## 2018-02-05 IMAGING — CR DG CHEST 2V
2 series · 2 of 2 positions shown · non-contrast
Comparison: 05/11/2017

CLINICAL DATA: Chest pain beginning yesterday. Nonproductive cough.
Shortness of breath.

EXAM:
CHEST  2 VIEW

[w chest pa]
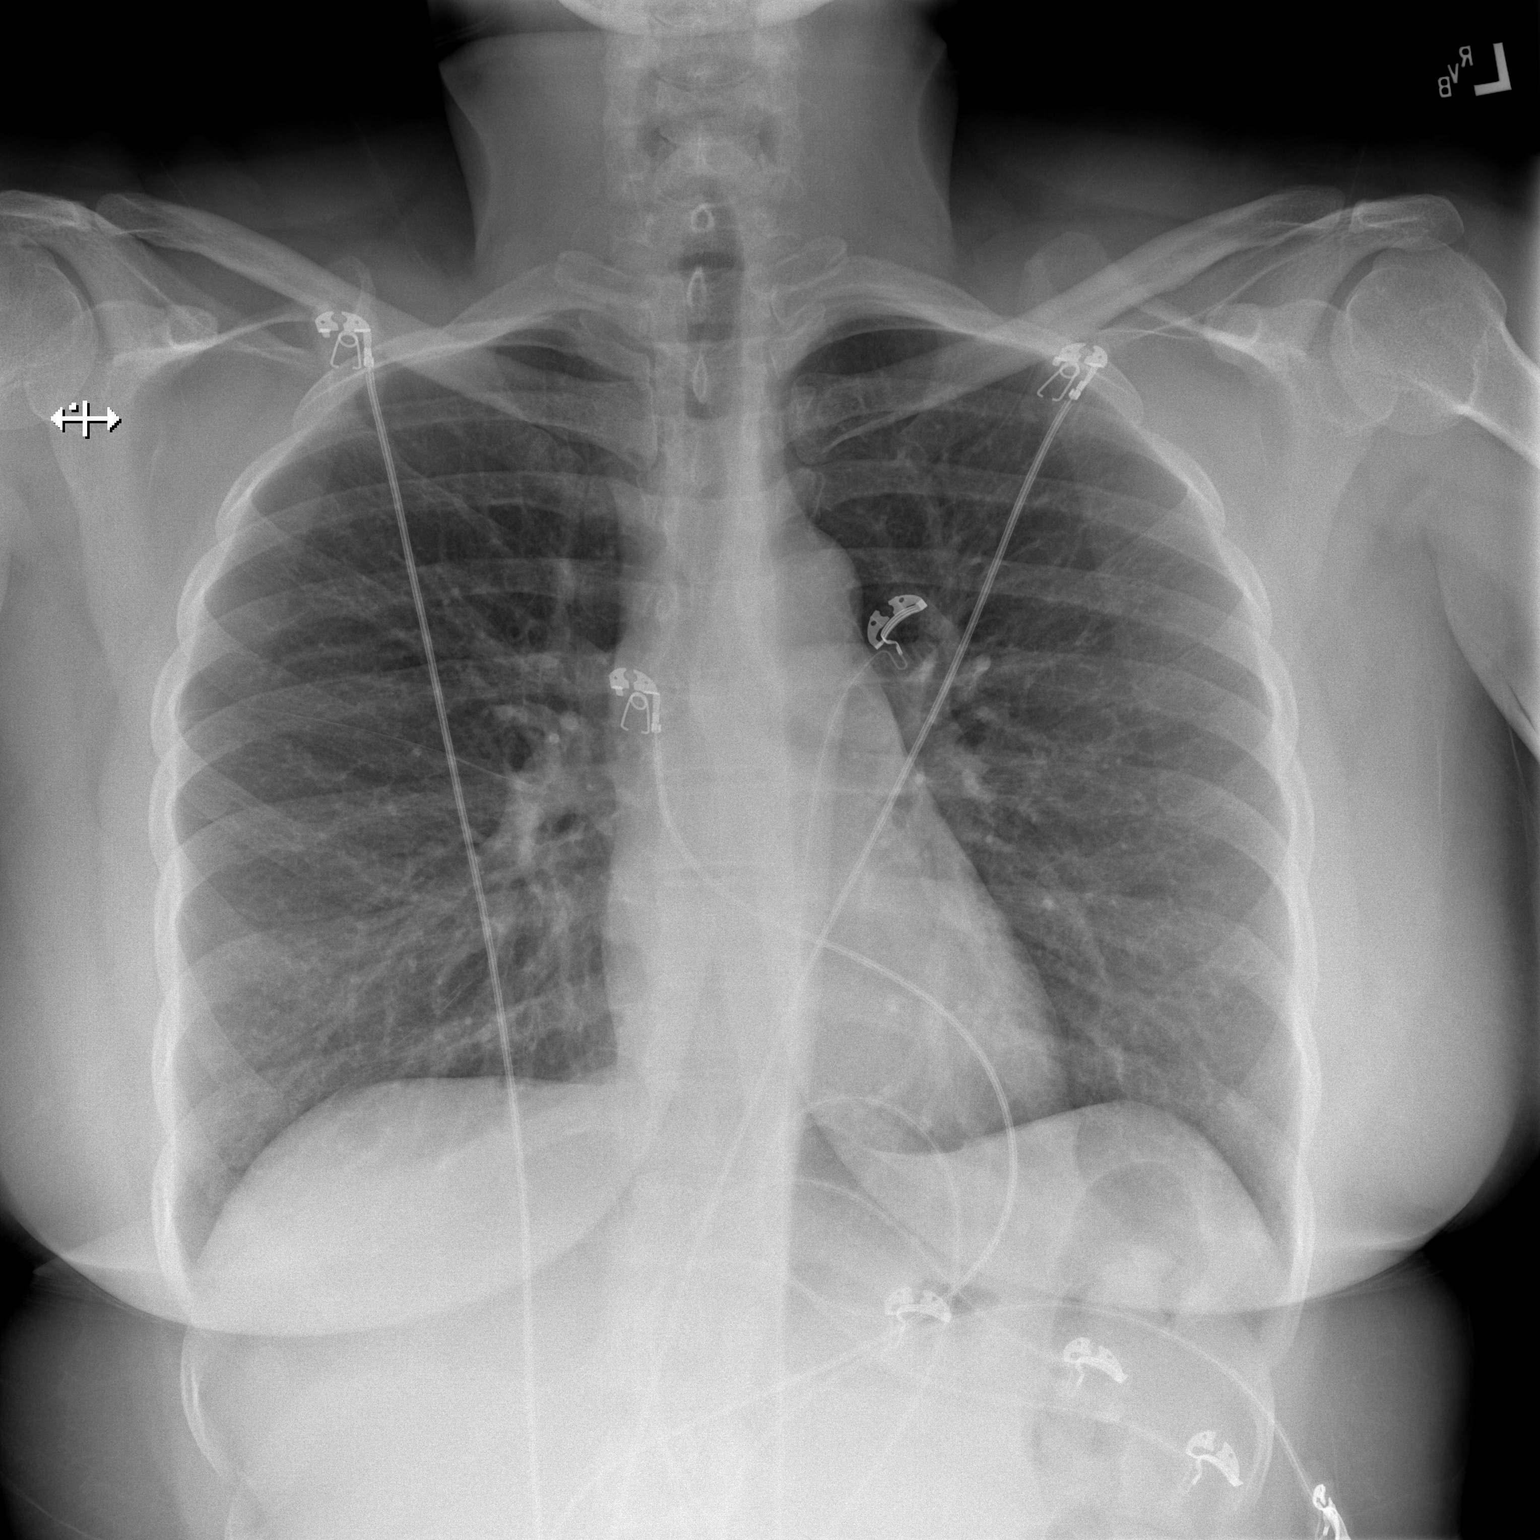

[w chest lat]
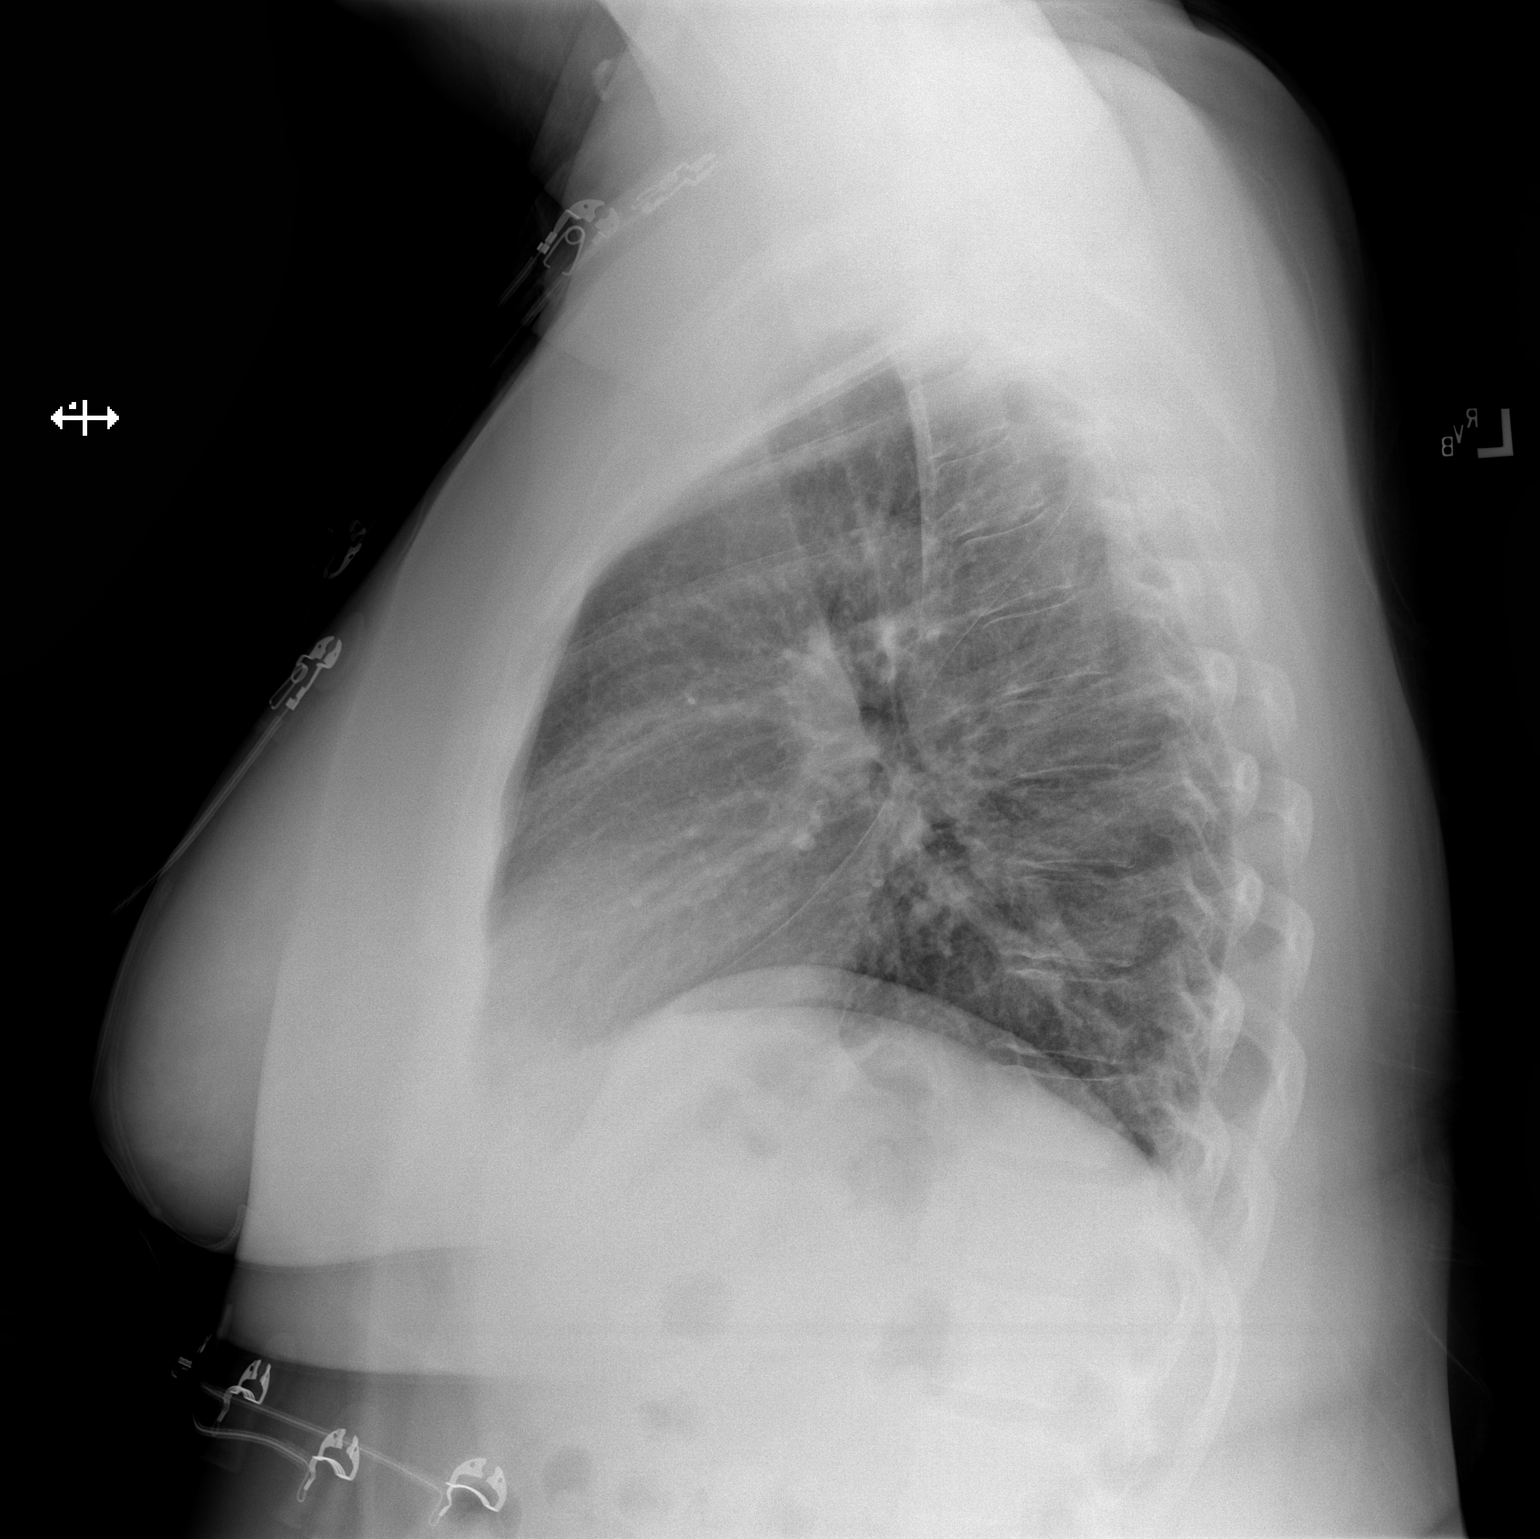

[2 of 2 positions shown; findings below may reference images not displayed]

FINDINGS: The heart size and mediastinal contours are within normal limits.
Both lungs are clear. The visualized skeletal structures are
unremarkable.
IMPRESSION: Negative.  No active cardiopulmonary disease.

## 2018-02-05 NOTE — ED Notes (Signed)
Pt stated she is not waiting any longer. Pt informed to stay but refused. Pt left at this time.

## 2018-02-05 NOTE — ED Triage Notes (Signed)
Pt has been having chest pain for the past two weeks. Pain is centralized and radiates to R arm with numbness and tingling at times.   EMS VS 123/81; pulse 59; CBG 91

## 2018-02-05 NOTE — ED Notes (Signed)
Spoke to pt in length about staying. Pt reports she has hx of pericarditis and can take motrin at home. Asked pt if she could at least have her chest xray, pt refused. Initial labwork at [facility] was negative. Pt reports she will follow up with her PCP.

## 2018-02-07 ENCOUNTER — Encounter (HOSPITAL_COMMUNITY): Payer: Self-pay | Admitting: Family Medicine

## 2018-02-07 ENCOUNTER — Ambulatory Visit (HOSPITAL_COMMUNITY)
Admission: EM | Admit: 2018-02-07 | Discharge: 2018-02-07 | Disposition: A | Discharge status: Home / Self Care | Payer: PRIVATE HEALTH INSURANCE | Attending: Emergency Medicine | Admitting: Emergency Medicine

## 2018-02-07 DIAGNOSIS — T7840XA Allergy, unspecified, initial encounter: Secondary | ICD-10-CM | POA: Diagnosis not present

## 2018-02-07 MED ORDER — METHYLPREDNISOLONE SODIUM SUCC 125 MG IJ SOLR
125.0000 mg | Freq: Once | INTRAMUSCULAR | Status: AC
  Administered 2018-02-07: 125 mg via INTRAMUSCULAR

## 2018-02-07 MED ORDER — PREDNISONE 10 MG (21) PO TBPK: ORAL_TABLET | ORAL | 0 refills | Status: DC

## 2018-02-07 MED ORDER — EPINEPHRINE 0.3 MG/0.3ML IJ SOAJ: 0.3000 mg | Freq: Once | INTRAMUSCULAR | 1 refills | Status: AC

## 2018-02-07 MED ORDER — METHYLPREDNISOLONE SODIUM SUCC 125 MG IJ SOLR
INTRAMUSCULAR | Status: AC
  Filled 2018-02-07: qty 2

## 2018-02-07 NOTE — Discharge Instructions (Signed)
We gave you a shot of steroids today.  Please begin prednisone taper tomorrow.  I have provided a refill of EpiPen.  Please return if you develop difficulty breathing or shortness of breath.

## 2018-02-07 NOTE — ED Triage Notes (Signed)
Pt here for allergic reaction to tea. Reports that she used her epi pen. Pt in no acute distress. Speaking in full sentences. Has sensation of something in her throat. Hard to swallow.

## 2018-02-08 NOTE — ED Provider Notes (Signed)
[place]    CSN: [id number] Arrival date & time: 02/07/18  1220     History   Chief Complaint Chief Complaint  Patient presents with  . Allergic Reaction    HPI Nicole [name] is a 42 y.o. female no contributing past medical history presenting today with allergic reaction.  She has delayed anaphylaxis when drinking Tea.  She states that last night she ordered coke and they accidentally brought her to you.  This morning she was having difficulty breathing and had to use her EpiPen.  She is breathing comfortably now, and does not feel short of breath.  She does feel like she has a knot stuck in her throat.  She states that this is normal for her after her allergic reactions.  It typically improves with steroids.  She states that she is gotten both the injection and oral before.  HPI  Past Medical History:  Diagnosis Date  . Anemia   . Asthma   . Atypical chest pain    a. ?pericarditis 2015.  Marland Kitchen Blood transfusion 2011   r/t anemia  . Fibroid   . Migraine   . Seizures ([place])   . Sickle cell trait ([place])   . Tooth abscess     Patient Active Problem List   Diagnosis Date Noted  . 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 Depression   . Generalized abdominal pain   . Fatigue 10/28/2015  . Adjustment disorder with depressed mood 10/21/2015  . Dizziness   . Weakness 11/18/2014  . OSA (obstructive sleep apnea) 10/21/2014  . Acute rhinosinusitis 10/09/2014  . Leg pain, lateral 09/04/2014  . Migraines 08/13/2014  . Seizure ([place]) 06/04/2014  . Leiomyoma of uterus, unspecified 02/11/2014  . GERD (gastroesophageal reflux disease) 01/28/2014  . Candidiasis of vulva and vagina 01/28/2014  . Unspecified symptom associated with female genital organs 01/28/2014  . Chest pain 01/20/2014  . Acute pericarditis, unspecified 01/20/2014    Past Surgical History:  Procedure Laterality Date  . TUBAL LIGATION Bilateral 2005    OB History    Gravida Para Term Preterm AB Living   3 2 1 1 1  2    SAB TAB Ectopic Multiple Live Births   0 0 1 0 2       Home Medications    Prior to Admission medications   Medication Sig Start Date End Date Taking? Authorizing Provider  acetaminophen (TYLENOL) 500 MG tablet Take 1 tablet (500 mg total) by mouth every 6 (six) hours as needed. 09/27/15   Gloriann Loan, PA-C  aspirin 81 MG chewable tablet Chew 81 mg by mouth once.    [provider]  cetirizine (ZYRTEC) 10 MG tablet Take 10 mg by mouth daily.    [provider]  clonazePAM (KLONOPIN) 0.5 MG tablet Take 1 tablet (0.5 mg total) by mouth 2 (two) times daily. 11/05/16   Montine Circle, PA-C  cyclobenzaprine (FLEXERIL) 10 MG tablet Take 1 tablet (10 mg total) by mouth at bedtime. 05/11/17   Fawze, Mina A, PA-C  doxycycline (VIBRAMYCIN) 100 MG capsule Take 1 capsule (100 mg total) by mouth 2 (two) times daily. 01/01/18   Lacretia Leigh, MD  FLUoxetine (PROZAC) 20 MG capsule Take 1 capsule (20 mg total) by mouth daily. 10/28/15   Haney, Amedeo Plenty, MD  gabapentin (NEURONTIN) 300 MG capsule Take 1 capsule (300 mg total) by mouth 3 (three) times daily. Patient taking differently: Take 300-1,200 mg by mouth 3 (three) times daily. Takes one capsule twice  daily and then takes four capsules at bedtime. 01/25/15   Tanna Furry, MD  ibuprofen (ADVIL,MOTRIN) 600 MG tablet Take 1 tablet (600 mg total) by mouth every 6 (six) hours as needed. Patient not taking: Reported on 11/29/2017 06/09/17   Varney Biles, MD  ibuprofen (ADVIL,MOTRIN) 800 MG tablet Take 1 tablet (800 mg total) by mouth every 8 (eight) hours as needed. 01/24/18   Shelly Bombard, MD  levETIRAcetam (KEPPRA) 1000 MG tablet Take 1 tablet (1,000 mg total) by mouth 2 (two) times daily. 08/19/17   Antonietta Breach, PA-C  medroxyPROGESTERone (DEPO-PROVERA) 150 MG/ML injection Inject 1 mL (150 mg total) into the muscle every 3 (three) months. Inject on  the 22nd of each month 11/28/17   Shelly Bombard, MD  meloxicam (MOBIC) 7.5 MG  tablet Take 1 tablet (7.5 mg total) by mouth 2 (two) times daily. 05/11/17   Fawze, Mina A, PA-C  methocarbamol (ROBAXIN) 500 MG tablet Take 1 tablet (500 mg total) by mouth 2 (two) times daily. 02/06/17   Joy, Shawn C, PA-C  metroNIDAZOLE (FLAGYL) 500 MG tablet Take 1 tablet (500 mg total) by mouth 2 (two) times daily. 01/25/18   Shelly Bombard, MD  predniSONE (STERAPRED UNI-PAK 21 TAB) 10 MG (21) TBPK tablet Please take 6 tablets on day 1, then decrease by 1 tablet for the following 5 days. 02/07/18   Sharryn Belding C, PA-C  topiramate (TOPAMAX) 25 MG tablet TAKE 3 TABLETS BY MOUTH DAILY Patient taking differently: TAKE 75mg  BY MOUTH TWICE DAILY 03/07/16   Leone Brand, MD  triamcinolone (NASACORT) 55 MCG/ACT AERO nasal inhaler Place 2 sprays into the nose daily. 01/01/18   Lacretia Leigh, MD    Family History Family History  Problem Relation Age of Onset  . Hypertension Mother   . Diabetes Mother   . Cancer Mother        Colon, brain, 2 other  . Asthma Mother   . Clotting disorder Mother   . Heart disease Mother        48, stents  . Stroke Mother        2012  . Diabetes Father   . Asthma Father   . Clotting disorder Father   . Sickle cell anemia Father   . Heart disease Father        2000, stents  . Hypertension Father   . Stroke Father        2007  . Diabetes Maternal Grandmother   . Asthma Sister   . Asthma Brother   . Asthma Paternal Grandmother   . Anesthesia problems Neg Hx     Social History Social History   Tobacco Use  . Smoking status: Former Smoker    Last attempt to quit: 03/27/2014    Years since quitting: 3.8  . Smokeless tobacco: Never Used  Substance Use Topics  . Alcohol use: No  . Drug use: No     Allergies   Penicillins; Shrimp [shellfish allergy]; Tea; and Sulfa antibiotics   Review of Systems Review of Systems  Constitutional: Negative for fatigue and fever.  HENT: Negative for sore throat and trouble swallowing.        Knot in  throat  Eyes: Negative for visual disturbance.  Respiratory: Negative for cough, chest tightness and shortness of breath.   Cardiovascular: Negative for chest pain.  Gastrointestinal: Negative for nausea and vomiting.  Neurological: Negative for dizziness, weakness, light-headedness and headaches.     Physical Exam Triage Vital Signs  ED Triage Vitals [02/07/18 1330]  Enc Vitals Group     BP (!) 130/91     Pulse Rate 71     Resp 18     Temp 98.4 F (36.9 C)     Temp src      SpO2 100 %     Weight      Height      Head Circumference      Peak Flow      Pain Score      Pain Loc      Pain Edu?      Excl. in [place]?    No data found.  Updated Vital Signs BP (!) 130/91   Pulse 71   Temp 98.4 F (36.9 C)   Resp 18   LMP 01/16/2018   SpO2 100%   Visual Acuity Right Eye Distance:   Left Eye Distance:   Bilateral Distance:    Right Eye Near:   Left Eye Near:    Bilateral Near:     Physical Exam  Constitutional: She appears well-developed and well-nourished. No distress.  HENT:  Head: Normocephalic and atraumatic.  Mouth/Throat: Oropharynx is clear and moist.  Normal elevation of the tracheal cartilage with swallowing, patient able to swallow water without issue.  Eyes: Conjunctivae are normal.  Neck: Neck supple.  Cardiovascular: Normal rate and regular rhythm.  No murmur heard. Pulmonary/Chest: Effort normal and breath sounds normal. No respiratory distress.  Breathing comfortably at rest, patient is not tachypneic, CTA BL  Abdominal: Soft. There is no tenderness.  Musculoskeletal: She exhibits no edema.  Neurological: She is alert.  Skin: Skin is warm and dry.  Psychiatric: She has a normal mood and affect.  Nursing note and vitals reviewed.    UC Treatments / Results  Labs (all labs ordered are listed, but only abnormal results are displayed) Labs Reviewed - No data to display  EKG  EKG Interpretation None       Radiology No results  found.  Procedures Procedures (including critical care time)  Medications Ordered in UC Medications  methylPREDNISolone sodium succinate (SOLU-MEDROL) 125 mg/2 mL injection 125 mg (125 mg Intramuscular Given 02/07/18 1522)     Initial Impression / Assessment and Plan / UC Course  I have reviewed the triage vital signs and the nursing notes.  Pertinent labs & imaging results that were available during my care of the patient were reviewed by me and considered in my medical decision making (see chart for details).     Patient is in no acute distress, breathing comfortably.  We will provide Solu-Medrol injection and prednisone taper over the next week.  Refilled EpiPen. Discussed strict return precautions. Patient verbalized understanding and is agreeable with plan.   Final Clinical Impressions(s) / UC Diagnoses   Final diagnoses:  Allergic reaction, initial encounter    ED Discharge Orders        Ordered    predniSONE (STERAPRED UNI-PAK 21 TAB) 10 MG (21) TBPK tablet     02/07/18 1514    EPINEPHrine 0.3 mg/0.3 mL IJ SOAJ injection   Once     02/07/18 1514       Controlled Substance Prescriptions [place] Controlled Substance Registry consulted? Not Applicable   Janith Lima, [place] 02/08/18 [phone]

## 2018-02-11 ENCOUNTER — Ambulatory Visit: Payer: Self-pay | Admitting: Obstetrics

## 2018-02-15 ENCOUNTER — Encounter (HOSPITAL_COMMUNITY): Payer: Self-pay | Admitting: Emergency Medicine

## 2018-02-15 ENCOUNTER — Telehealth (HOSPITAL_COMMUNITY): Payer: Self-pay | Admitting: Internal Medicine

## 2018-02-15 ENCOUNTER — Ambulatory Visit (HOSPITAL_COMMUNITY)
Admission: EM | Admit: 2018-02-15 | Discharge: 2018-02-15 | Disposition: A | Discharge status: Home / Self Care | Payer: No Typology Code available for payment source | Attending: Internal Medicine | Admitting: Internal Medicine

## 2018-02-15 ENCOUNTER — Other Ambulatory Visit: Payer: Self-pay

## 2018-02-15 DIAGNOSIS — R569 Unspecified convulsions: Secondary | ICD-10-CM | POA: Insufficient documentation

## 2018-02-15 DIAGNOSIS — Z79899 Other long term (current) drug therapy: Secondary | ICD-10-CM | POA: Insufficient documentation

## 2018-02-15 DIAGNOSIS — R197 Diarrhea, unspecified: Secondary | ICD-10-CM | POA: Insufficient documentation

## 2018-02-15 DIAGNOSIS — D573 Sickle-cell trait: Secondary | ICD-10-CM | POA: Diagnosis not present

## 2018-02-15 DIAGNOSIS — Z880 Allergy status to penicillin: Secondary | ICD-10-CM | POA: Diagnosis not present

## 2018-02-15 DIAGNOSIS — F4321 Adjustment disorder with depressed mood: Secondary | ICD-10-CM | POA: Insufficient documentation

## 2018-02-15 DIAGNOSIS — Z832 Family history of diseases of the blood and blood-forming organs and certain disorders involving the immune mechanism: Secondary | ICD-10-CM | POA: Insufficient documentation

## 2018-02-15 DIAGNOSIS — Z87891 Personal history of nicotine dependence: Secondary | ICD-10-CM | POA: Diagnosis not present

## 2018-02-15 DIAGNOSIS — Z882 Allergy status to sulfonamides status: Secondary | ICD-10-CM | POA: Insufficient documentation

## 2018-02-15 DIAGNOSIS — G4733 Obstructive sleep apnea (adult) (pediatric): Secondary | ICD-10-CM | POA: Insufficient documentation

## 2018-02-15 DIAGNOSIS — Z91013 Allergy to seafood: Secondary | ICD-10-CM | POA: Diagnosis not present

## 2018-02-15 DIAGNOSIS — Z823 Family history of stroke: Secondary | ICD-10-CM | POA: Diagnosis not present

## 2018-02-15 DIAGNOSIS — Z8249 Family history of ischemic heart disease and other diseases of the circulatory system: Secondary | ICD-10-CM | POA: Insufficient documentation

## 2018-02-15 DIAGNOSIS — J029 Acute pharyngitis, unspecified: Secondary | ICD-10-CM | POA: Diagnosis not present

## 2018-02-15 DIAGNOSIS — Z833 Family history of diabetes mellitus: Secondary | ICD-10-CM | POA: Diagnosis not present

## 2018-02-15 DIAGNOSIS — Z825 Family history of asthma and other chronic lower respiratory diseases: Secondary | ICD-10-CM | POA: Diagnosis not present

## 2018-02-15 DIAGNOSIS — B349 Viral infection, unspecified: Secondary | ICD-10-CM

## 2018-02-15 LAB — POCT RAPID STREP A: STREPTOCOCCUS, GROUP A SCREEN (DIRECT): NEGATIVE

## 2018-02-15 MED ORDER — NAPROXEN 500 MG PO TABS: 500.0000 mg | ORAL_TABLET | Freq: Two times a day (BID) | ORAL | 0 refills | Status: DC

## 2018-02-15 NOTE — ED Triage Notes (Signed)
The patient presented to the [facility] with a complaint of a sore throat and general body aches x 1 week.

## 2018-02-15 NOTE — Telephone Encounter (Signed)
rx naproxen sent to pharmacy for symptomatic relief pending throat culture result.  LM

## 2018-02-15 NOTE — ED Provider Notes (Signed)
[place]    CSN: [id number] Arrival date & time: 02/15/18  3557     History   Chief Complaint Chief Complaint  Patient presents with  . Sore Throat    HPI Nicole [name] is a 42 y.o. female.   She presents today with one-week history of tactile temperature, achiness, just feels sore to touch all over.  Has some discomfort in her anterior neck, central chest, especially with cough.  Little bit of sore throat, little bit of cough.  Runny nose/congestion.  Having some diarrhea.  Her son has had similar symptoms.   HPI  Past Medical History:  Diagnosis Date  . Anemia   . Asthma   . Atypical chest pain    a. ?pericarditis 2015.  Marland Kitchen Blood transfusion 2011   r/t anemia  . Fibroid   . Migraine   . Seizures ([place])   . Sickle cell trait ([place])   . Tooth abscess     Patient Active Problem List   Diagnosis Date Noted  . 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 Depression   . Generalized abdominal pain   . Fatigue 10/28/2015  . Adjustment disorder with depressed mood 10/21/2015  . Dizziness   . Weakness 11/18/2014  . OSA (obstructive sleep apnea) 10/21/2014  . Acute rhinosinusitis 10/09/2014  . Leg pain, lateral 09/04/2014  . Migraines 08/13/2014  . Seizure ([place]) 06/04/2014  . Leiomyoma of uterus, unspecified 02/11/2014  . GERD (gastroesophageal reflux disease) 01/28/2014  . Candidiasis of vulva and vagina 01/28/2014  . Unspecified symptom associated with female genital organs 01/28/2014  . Chest pain 01/20/2014  . Acute pericarditis, unspecified 01/20/2014    Past Surgical History:  Procedure Laterality Date  . TUBAL LIGATION Bilateral 2005    OB History    Gravida Para Term Preterm AB Living   3 2 1 1 1 2    SAB TAB Ectopic Multiple Live Births   0 0 1 0 2       Home Medications    Prior to Admission medications   Medication Sig Start Date End Date Taking? Authorizing Provider  clonazePAM (KLONOPIN) 0.5 MG tablet Take 1 tablet (0.5 mg total) by mouth 2 (two)  times daily. 11/05/16  Yes Montine Circle, PA-C  levETIRAcetam (KEPPRA) 1000 MG tablet Take 1 tablet (1,000 mg total) by mouth 2 (two) times daily. 08/19/17  Yes Antonietta Breach, PA-C  naproxen (NAPROSYN) 500 MG tablet Take 1 tablet (500 mg total) by mouth 2 (two) times daily. 02/15/18   Wynona Luna, MD    Family History Family History  Problem Relation Age of Onset  . Hypertension Mother   . Diabetes Mother   . Cancer Mother        Colon, brain, 2 other  . Asthma Mother   . Clotting disorder Mother   . Heart disease Mother        31, stents  . Stroke Mother        2012  . Diabetes Father   . Asthma Father   . Clotting disorder Father   . Sickle cell anemia Father   . Heart disease Father        2000, stents  . Hypertension Father   . Stroke Father        2007  . Diabetes Maternal Grandmother   . Asthma Sister   . Asthma Brother   . Asthma Paternal Grandmother   . Anesthesia problems Neg Hx     Social History Social History  Tobacco Use  . Smoking status: Former Smoker    Last attempt to quit: 03/27/2014    Years since quitting: 3.9  . Smokeless tobacco: Never Used  Substance Use Topics  . Alcohol use: No  . Drug use: No     Allergies   Penicillins; Shrimp [shellfish allergy]; Tea; and Sulfa antibiotics   Review of Systems Review of Systems  All other systems reviewed and are negative.    Physical Exam  Updated Vital Signs BP 119/86 (BP Location: Left Arm)   Pulse 85   Temp 98.1 F (36.7 C) (Oral)   Resp 18   LMP 01/16/2018   SpO2 97%  Physical Exam  Constitutional: She is oriented to person, place, and time. No distress.  Voice sounds quite congested  HENT:  Head: Atraumatic.  Bilateral TMs are moderately dull, no erythema Moderate nasal congestion with mucousy discharge evident Throat is slightly injected  Eyes:  Conjugate gaze observed, no eye redness/discharge  Neck: Neck supple.  Cardiovascular: Normal rate and regular rhythm.    Pulmonary/Chest: No respiratory distress. She has no wheezes. She has no rales.  Coarse but symmetric breath sounds throughout  Abdominal: She exhibits no distension.  Musculoskeletal: Normal range of motion.  Neurological: She is alert and oriented to person, place, and time.  Skin: Skin is warm and dry.  Nursing note and vitals reviewed.    UC Treatments / Results  Labs Results for orders placed or performed during the hospital encounter of 02/15/18  Culture, group A strep  Result Value Ref Range   Specimen Description THROAT    Special Requests NONE    Culture      NO GROUP A STREP (S.PYOGENES) ISOLATED Performed at [facility], [place] [address], [place], [place] [address]    Report Status 02/17/2018 FINAL   POCT rapid strep A [facility])  Result Value Ref Range   Streptococcus, Group A Screen (Direct) NEGATIVE NEGATIVE    Procedures Procedures (including critical care time) None today  Final Clinical Impressions(s) / UC Diagnoses   Final diagnoses:  Viral syndrome   Strep test at the urgent care was negative today; a throat culture is pending.  Anticipate gradual improvement in achiness, well-being, over the next several days.  Rest and push fluids.  Prescription for naproxen sent to pharmacy, instead of motrin, to help with feverish feeling and achiness.  Note for work, return to work 3/10. Recheck for new fever >100.5, increasing phlegm production/nasal discharge, or if not starting to improve in a few days.      Wynona Luna, MD 02/18/18 [phone]

## 2018-02-15 NOTE — Discharge Instructions (Addendum)
Strep test at the urgent care was negative today; a throat culture is pending.  Anticipate gradual improvement in achiness, well-being, over the next several days.  Rest and push fluids.  Prescription for naproxen sent to pharmacy, instead of motrin, to help with feverish feeling and achiness.  Note for work, return to work 3/10. Recheck for new fever >100.5, increasing phlegm production/nasal discharge, or if not starting to improve in a few days.

## 2018-02-17 LAB — CULTURE, GROUP A STREP (THRC)

## 2018-03-10 ENCOUNTER — Ambulatory Visit (HOSPITAL_COMMUNITY)
Admission: EM | Admit: 2018-03-10 | Discharge: 2018-03-10 | Disposition: A | Discharge status: Home / Self Care | Payer: PRIVATE HEALTH INSURANCE | Attending: Physician Assistant | Admitting: Physician Assistant

## 2018-03-10 ENCOUNTER — Encounter (HOSPITAL_COMMUNITY): Payer: Self-pay | Admitting: Family Medicine

## 2018-03-10 DIAGNOSIS — K219 Gastro-esophageal reflux disease without esophagitis: Secondary | ICD-10-CM | POA: Diagnosis not present

## 2018-03-10 MED ORDER — GI COCKTAIL ~~LOC~~
30.0000 mL | Freq: Once | ORAL | Status: AC
  Administered 2018-03-10: 30 mL via ORAL

## 2018-03-10 MED ORDER — PANTOPRAZOLE SODIUM 40 MG PO TBEC: 40.0000 mg | DELAYED_RELEASE_TABLET | Freq: Every day | ORAL | 0 refills | Status: DC

## 2018-03-10 MED ORDER — FAMOTIDINE 20 MG PO TABS: 20.0000 mg | ORAL_TABLET | Freq: Every day | ORAL | 0 refills | Status: DC

## 2018-03-10 MED ORDER — GI COCKTAIL ~~LOC~~
ORAL | Status: AC
Start: 2018-03-10 — End: ?
  Filled 2018-03-10: qty 30

## 2018-03-10 NOTE — ED Provider Notes (Signed)
03/10/2018 12:35 PM   DOB: October 06, 1976 / MRN: [id number]  SUBJECTIVE:  Nicole [name] is a 41 y.o. female presenting for throat burning.  Patient reports that this morning was drinking a cup of hot coffee and felt her throat was burning.  She has been having "heartburn" over the last week and this is a new symptom for her.  She denies chest pain shortness of breath as well as leg swelling at this time.  She did take Prilosec for 2 doses starting 3 days ago.  Tells me this is not really helping her symptoms.  She is allergic to penicillins; shrimp [shellfish allergy]; tea; and sulfa antibiotics.   She  has a past medical history of Anemia, Asthma, Atypical chest pain, Blood transfusion (2011), Fibroid, Migraine, Seizures ([place]), Sickle cell trait (Nicole [name]), and Tooth abscess.    She  reports that she quit smoking about 3 years ago. She has never used smokeless tobacco. She reports that she does not drink alcohol or use drugs. She  reports that she currently engages in sexual activity and has had partners who are Female. She reports using the following methods of birth control/protection: Condom, Injection, and Surgical. The patient  has a past surgical history that includes Tubal ligation (Bilateral, 2005).  Her family history includes Asthma in her brother, father, mother, paternal grandmother, and sister; Cancer in her mother; Clotting disorder in her father and mother; Diabetes in her father, maternal grandmother, and mother; Heart disease in her father and mother; Hypertension in her father and mother; Sickle cell anemia in her father; Stroke in her father and mother.  Review of Systems  Constitutional: Negative for chills, diaphoresis and fever.  Respiratory: Negative for cough, hemoptysis, sputum production, shortness of breath and wheezing.   Cardiovascular: Negative for chest pain, orthopnea and leg swelling.  Gastrointestinal: Positive for heartburn. Negative for abdominal pain, blood in stool,  constipation, diarrhea, melena, nausea and vomiting.  Skin: Negative for rash.  Neurological: Negative for dizziness.    OBJECTIVE:  BP 122/90   Pulse 87   Temp 98.3 F (36.8 C)   Resp 20   SpO2 97%   Physical Exam  Constitutional: She is active.  Non-toxic appearance.  HENT:  Right Ear: Hearing, tympanic membrane, external ear and ear canal normal.  Left Ear: Hearing, tympanic membrane, external ear and ear canal normal.  Nose: Nose normal. Right sinus exhibits no maxillary sinus tenderness and no frontal sinus tenderness. Left sinus exhibits no maxillary sinus tenderness and no frontal sinus tenderness.  Mouth/Throat: Uvula is midline, oropharynx is clear and moist and mucous membranes are normal. Mucous membranes are not dry. No oropharyngeal exudate, posterior oropharyngeal edema or tonsillar abscesses.  Cardiovascular: Normal rate, regular rhythm, S1 normal, S2 normal, normal heart sounds and intact distal pulses. Exam reveals no gallop, no friction rub and no decreased pulses.  No murmur heard. Pulmonary/Chest: Effort normal. No stridor. No tachypnea. No respiratory distress. She has no wheezes. She has no rales.  Abdominal: She exhibits no distension.  Musculoskeletal: She exhibits no edema.  Lymphadenopathy:       Head (right side): No submandibular and no tonsillar adenopathy present.       Head (left side): No submandibular and no tonsillar adenopathy present.    She has no cervical adenopathy.  Neurological: She is alert.  Skin: Skin is warm and dry. She is not diaphoretic. No pallor.    No results found for this or any previous visit (from the past 72 hour(s)).  No results found.  ASSESSMENT AND PLAN:  No orders of the defined types were placed in this encounter.    Gastroesophageal reflux disease, esophagitis presence not specified - New diagnosis for her.  There are no signs of anaphylaxis today.  I am advising that she follow-up with her primary care in the  next 4-5 days.      The patient is advised to call or return to clinic if she does not see an improvement in symptoms, or to seek the care of the closest emergency department if she worsens with the above plan.   Philis Fendt, MHS, PA-C 03/10/2018 12:35 PM    Tereasa Coop, PA-C 03/10/18 1236

## 2018-03-10 NOTE — ED Triage Notes (Signed)
Pt here for trouble swallowing after she drank some OJ this am. Reports sensations of something that is stuck in her throat. No allergy to OJ that she knows of. No rash noted or itching. Marland Kitchen

## 2018-03-10 NOTE — Discharge Instructions (Signed)
Please call Dr. Orest Dikes office and try to get an appointment for the next 3-4 days.  Please take the medication is exactly as prescribed.  I would avoid juices, acidic fruits, high fat meals.

## 2018-04-18 ENCOUNTER — Other Ambulatory Visit: Payer: Self-pay | Admitting: Physician Assistant

## 2018-04-22 ENCOUNTER — Other Ambulatory Visit: Payer: Self-pay | Admitting: Physician Assistant

## 2018-04-26 ENCOUNTER — Emergency Department (HOSPITAL_COMMUNITY): Payer: No Typology Code available for payment source

## 2018-04-26 ENCOUNTER — Emergency Department (HOSPITAL_COMMUNITY)
Admission: EM | Admit: 2018-04-26 | Discharge: 2018-04-26 | Disposition: A | Discharge status: Home / Self Care | Payer: No Typology Code available for payment source | Attending: Emergency Medicine | Admitting: Emergency Medicine

## 2018-04-26 DIAGNOSIS — Z87891 Personal history of nicotine dependence: Secondary | ICD-10-CM | POA: Insufficient documentation

## 2018-04-26 DIAGNOSIS — Z79899 Other long term (current) drug therapy: Secondary | ICD-10-CM | POA: Insufficient documentation

## 2018-04-26 DIAGNOSIS — R0789 Other chest pain: Secondary | ICD-10-CM | POA: Insufficient documentation

## 2018-04-26 DIAGNOSIS — J45909 Unspecified asthma, uncomplicated: Secondary | ICD-10-CM | POA: Insufficient documentation

## 2018-04-26 LAB — CBC
HCT: 35.9 % — ABNORMAL LOW (ref 36.0–46.0)
Hemoglobin: 12 g/dL (ref 12.0–15.0)
MCH: 28.8 pg (ref 26.0–34.0)
MCHC: 33.4 g/dL (ref 30.0–36.0)
MCV: 86.1 fL (ref 78.0–100.0)
PLATELETS: 283 10*3/uL (ref 150–400)
RBC: 4.17 MIL/uL (ref 3.87–5.11)
RDW: 12.6 % (ref 11.5–15.5)
WBC: 6.8 10*3/uL (ref 4.0–10.5)

## 2018-04-26 LAB — BASIC METABOLIC PANEL
ANION GAP: 9 (ref 5–15)
BUN: <5 mg/dL — ABNORMAL LOW (ref 6–20)
CALCIUM: 8.4 mg/dL — AB (ref 8.9–10.3)
CO2: 20 mmol/L — AB (ref 22–32)
CREATININE: 0.84 mg/dL (ref 0.44–1.00)
Chloride: 110 mmol/L (ref 101–111)
Glucose, Bld: 96 mg/dL (ref 65–99)
Potassium: 3.6 mmol/L (ref 3.5–5.1)
SODIUM: 139 mmol/L (ref 135–145)

## 2018-04-26 LAB — I-STAT BETA HCG BLOOD, ED (MC, WL, AP ONLY): I-stat hCG, quantitative: <5 m[IU]/mL (ref <5)

## 2018-04-26 LAB — I-STAT TROPONIN, ED
TROPONIN I, POC: 0 ng/mL (ref 0.00–0.08)
Troponin i, poc: 0 ng/mL (ref 0.00–0.08)

## 2018-04-26 MED ORDER — PREDNISONE 20 MG PO TABS: 40.0000 mg | ORAL_TABLET | Freq: Every day | ORAL | 0 refills | Status: DC

## 2018-04-26 MED ORDER — KETOROLAC TROMETHAMINE 30 MG/ML IJ SOLN
30.0000 mg | Freq: Once | INTRAMUSCULAR | Status: AC
  Administered 2018-04-26: 30 mg via INTRAVENOUS
  Filled 2018-04-26: qty 1

## 2018-04-26 MED ORDER — TRAMADOL HCL 50 MG PO TABS
50.0000 mg | ORAL_TABLET | Freq: Once | ORAL | Status: AC
  Administered 2018-04-26: 50 mg via ORAL
  Filled 2018-04-26: qty 1

## 2018-04-26 NOTE — ED Provider Notes (Signed)
[facility] EMERGENCY DEPARTMENT Provider Note   CSN: [id number] Arrival date & time: 04/26/18  0116     History   Chief Complaint Chief Complaint  Patient presents with  . Chest Pain    HPI Nicole [name] is a 42 y.o. female.  HPI Patient presents with concern of chest pain. Pain is focally in the left upper chest, with pain in the shoulder posteriorly, and some radiation down the arm. Patient has a history of pericarditis, but no history of obstructive disease. This episode began about a week ago, and over the past day has become more severe, more persistent, 8/10. No associated dyspnea, nor syncope. There has been vomiting, earlier today. She has taken aspirin, no other medication for pain relief, and the aspirin did not change her pain appreciably.  Past Medical History:  Diagnosis Date  . Anemia   . Asthma   . Atypical chest pain    a. ?pericarditis 2015.  Marland Kitchen Blood transfusion 2011   r/t anemia  . Fibroid   . Migraine   . Seizures ([place])   . Sickle cell trait ([place])   . Tooth abscess     Patient Active Problem List   Diagnosis Date Noted  . 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 Depression   . Generalized abdominal pain   . Fatigue 10/28/2015  . Adjustment disorder with depressed mood 10/21/2015  . Dizziness   . Weakness 11/18/2014  . OSA (obstructive sleep apnea) 10/21/2014  . Acute rhinosinusitis 10/09/2014  . Leg pain, lateral 09/04/2014  . Migraines 08/13/2014  . Seizure ([place]) 06/04/2014  . Leiomyoma of uterus, unspecified 02/11/2014  . GERD (gastroesophageal reflux disease) 01/28/2014  . Candidiasis of vulva and vagina 01/28/2014  . Unspecified symptom associated with female genital organs 01/28/2014  . Chest pain 01/20/2014  . Acute pericarditis, unspecified 01/20/2014    Past Surgical History:  Procedure Laterality Date  . TUBAL LIGATION Bilateral 2005     OB History    Gravida  3   Para  2   Term  1   Preterm  1   AB  1   Living  2     SAB  0   TAB  0   Ectopic  1   Multiple  0   Live Births  2            Home Medications    Prior to Admission medications   Medication Sig Start Date End Date Taking? Authorizing Provider  EPINEPHrine (EPIPEN 2-PAK) 0.3 mg/0.3 mL IJ SOAJ injection Inject 0.3 mg into the muscle as needed (for allergic reaction).   Yes [provider]  levETIRAcetam (KEPPRA) 1000 MG tablet Take 1 tablet (1,000 mg total) by mouth 2 (two) times daily. 08/19/17  Yes Antonietta Breach, PA-C  topiramate (TOPAMAX) 25 MG tablet Take 75 mg by mouth 2 (two) times daily.   Yes [provider]  clonazePAM (KLONOPIN) 0.5 MG tablet Take 1 tablet (0.5 mg total) by mouth 2 (two) times daily. Patient not taking: Reported on 04/26/2018 11/05/16   Montine Circle, PA-C  famotidine (PEPCID) 20 MG tablet Take 1 tablet (20 mg total) by mouth at bedtime. Patient not taking: Reported on 04/26/2018 03/10/18   Tereasa Coop, PA-C  naproxen (NAPROSYN) 500 MG tablet Take 1 tablet (500 mg total) by mouth 2 (two) times daily. Patient not taking: Reported on 04/26/2018 02/15/18   Wynona Luna, MD  pantoprazole (PROTONIX) 40 MG tablet Take 1 tablet (  40 mg total) by mouth daily. Take on an empty stomach, 30 minutes before the first meal of the day. Patient not taking: Reported on 04/26/2018 03/10/18   Tereasa Coop, PA-C    Family History Family History  Problem Relation Age of Onset  . Hypertension Mother   . Diabetes Mother   . Cancer Mother        Colon, brain, 2 other  . Asthma Mother   . Clotting disorder Mother   . Heart disease Mother        20, stents  . Stroke Mother        2012  . Diabetes Father   . Asthma Father   . Clotting disorder Father   . Sickle cell anemia Father   . Heart disease Father        2000, stents  . Hypertension Father   . Stroke Father        2007  . Diabetes Maternal Grandmother   . Asthma Sister   . Asthma Brother   . Asthma Paternal  Grandmother   . Anesthesia problems Neg Hx     Social History Social History   Tobacco Use  . Smoking status: Former Smoker    Last attempt to quit: 03/27/2014    Years since quitting: 4.0  . Smokeless tobacco: Never Used  Substance Use Topics  . Alcohol use: No  . Drug use: No     Allergies   Penicillins; Shrimp [shellfish allergy]; Tea; and Sulfa antibiotics   Review of Systems Review of Systems  Constitutional:       Per HPI, otherwise negative  HENT:       Per HPI, otherwise negative  Respiratory:       Per HPI, otherwise negative  Cardiovascular:       Per HPI, otherwise negative  Gastrointestinal: Negative for vomiting.  Endocrine:       Negative aside from HPI  Genitourinary:       Neg aside from HPI   Musculoskeletal:       Per HPI, otherwise negative  Skin: Negative.   Neurological: Negative for syncope.     Physical Exam Updated Vital Signs BP 122/81   Pulse (!) 59   Temp 98.7 F (37.1 C) (Oral)   Resp (!) 21   Ht 5\' 5"  (1.651 m)   Wt 120.2 kg (265 lb)   SpO2 99%   BMI 44.10 kg/m   Physical Exam  Constitutional: She is oriented to person, place, and time. She appears well-developed and well-nourished. No distress.  HENT:  Head: Normocephalic and atraumatic.  Eyes: Conjunctivae and EOM are normal.  Cardiovascular: Normal rate and regular rhythm.  Pulmonary/Chest: Effort normal and breath sounds normal. No stridor. No respiratory distress.  No tenderness to palpation of the chest wall  Abdominal: She exhibits no distension.  Musculoskeletal: She exhibits no edema.  Neurological: She is alert and oriented to person, place, and time. No cranial nerve deficit.  Skin: Skin is warm and dry.  Psychiatric: She has a normal mood and affect.  Nursing note and vitals reviewed.    ED Treatments / Results  Labs (all labs ordered are listed, but only abnormal results are displayed) Labs Reviewed  BASIC METABOLIC PANEL - Abnormal; Notable for the  following components:      Result Value   CO2 20 (*)    BUN <5 (*)    Calcium 8.4 (*)    All other components within normal limits  CBC - Abnormal; Notable for the following components:   HCT 35.9 (*)    All other components within normal limits  I-STAT TROPONIN, ED  I-STAT BETA HCG BLOOD, ED ([facility], [facility], [facility] ONLY)  I-STAT TROPONIN, ED    EKG EKG Interpretation  Date/Time:  Friday Apr 26 2018 01:16:51 EDT Ventricular Rate:  71 PR Interval:    QRS Duration: 89 QT Interval:  408 QTC Calculation: 444 R Axis:   56 Text Interpretation:  Sinus rhythm Normal ECG Confirmed by Carmin Muskrat [id number]) on 04/26/2018 1:38:24 AM   Radiology Dg Chest 2 View  Result Date: 04/26/2018 CLINICAL DATA:  Acute onset of generalized fatigue and left-sided chest pain. Upper back and arm pain. EXAM: CHEST - 2 VIEW COMPARISON:  None. FINDINGS: The lungs are well-aerated and clear. There is no evidence of focal opacification, pleural effusion or pneumothorax. The heart is normal in size; the mediastinal contour is within normal limits. No acute osseous abnormalities are seen. IMPRESSION: No acute cardiopulmonary process seen. Electronically Signed   By: Garald Balding M.D.   On: 04/26/2018 01:58    Procedures Procedures (including critical care time)  Medications Ordered in ED Medications  ketorolac (TORADOL) 30 MG/ML injection 30 mg (30 mg Intravenous Given 04/26/18 0253)     Initial Impression / Assessment and Plan / ED Course  I have reviewed the triage vital signs and the nursing notes.  Pertinent labs & imaging results that were available during my care of the patient were reviewed by me and considered in my medical decision making (see chart for details).     4:31 AM On repeat exam the patient is in no distress. Patient has had 2 normal troponin, has a nonischemic EKG, unremarkable chest x-ray. Patient appears calm, and remains hemodynamically stable after hours of monitoring here in the  emergency department. Though the patient does have some left-sided chest pain, she has a reassuring risk profile, and given the aforementioned reassuring results, there is low suspicion for ongoing coronary ischemia, no evidence for other acute new pulmonary disease Patient discharged in stable condition.  Final Clinical Impressions(s) / ED Diagnoses  Atypical chest pain   Carmin Muskrat, MD 04/26/18 [phone]

## 2018-04-26 NOTE — Discharge Instructions (Signed)
As discussed, your evaluation today has been largely reassuring.  But, it is important that you monitor your condition carefully, and do not hesitate to return to the ED if you develop new, or concerning changes in your condition. ? ?Otherwise, please follow-up with your physician for appropriate ongoing care. ? ?

## 2018-04-26 NOTE — ED Notes (Signed)
Patient transported to X-ray 

## 2018-05-09 ENCOUNTER — Other Ambulatory Visit: Payer: Self-pay | Admitting: Physician Assistant

## 2018-05-11 IMAGING — CR DG CHEST 2V
2 series · 2 of 2 positions shown · non-contrast
Comparison: 06/09/2017

CLINICAL DATA: Mid chest pain radiating to left arm beginning
earlier today.

EXAM:
CHEST  2 VIEW

[w chest pa]
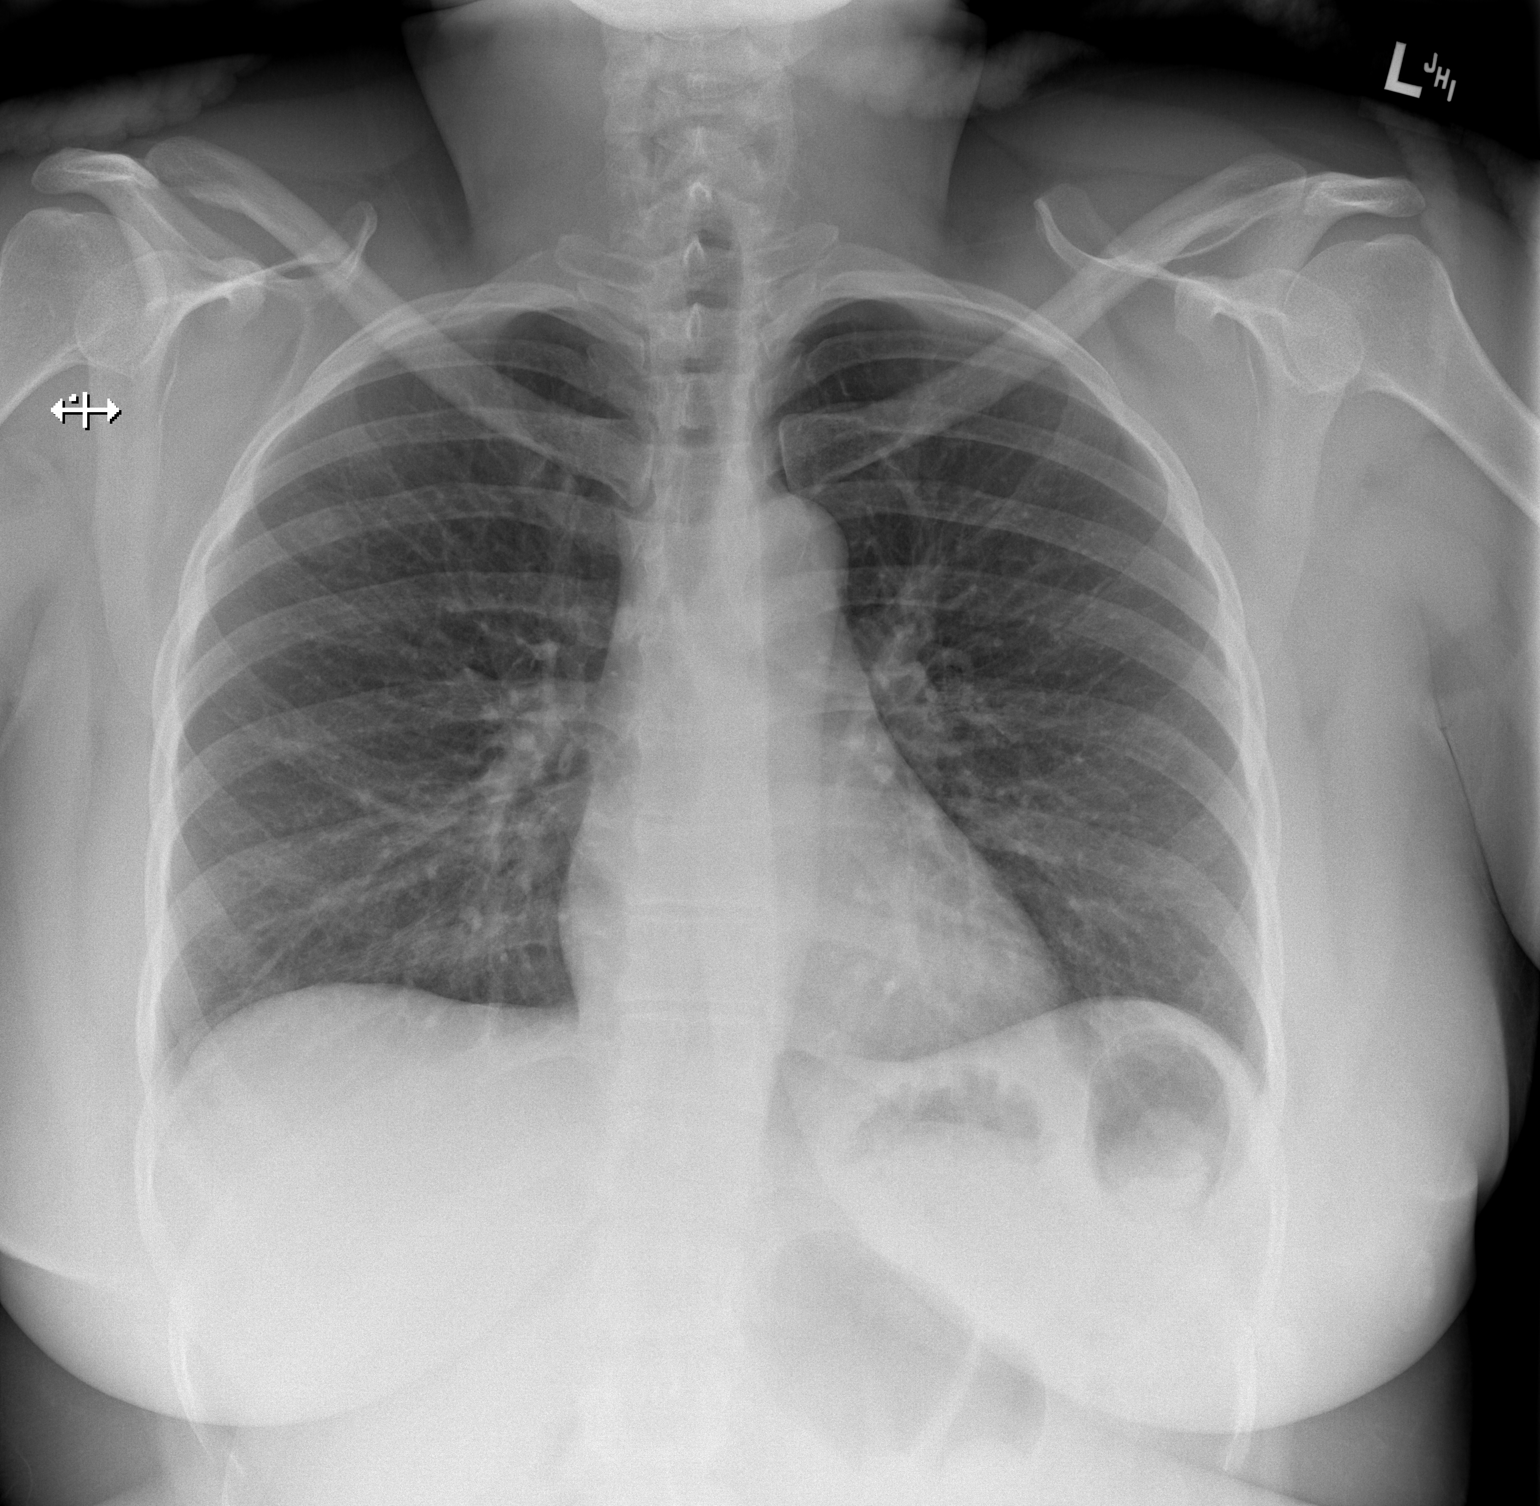

[w chest lat]
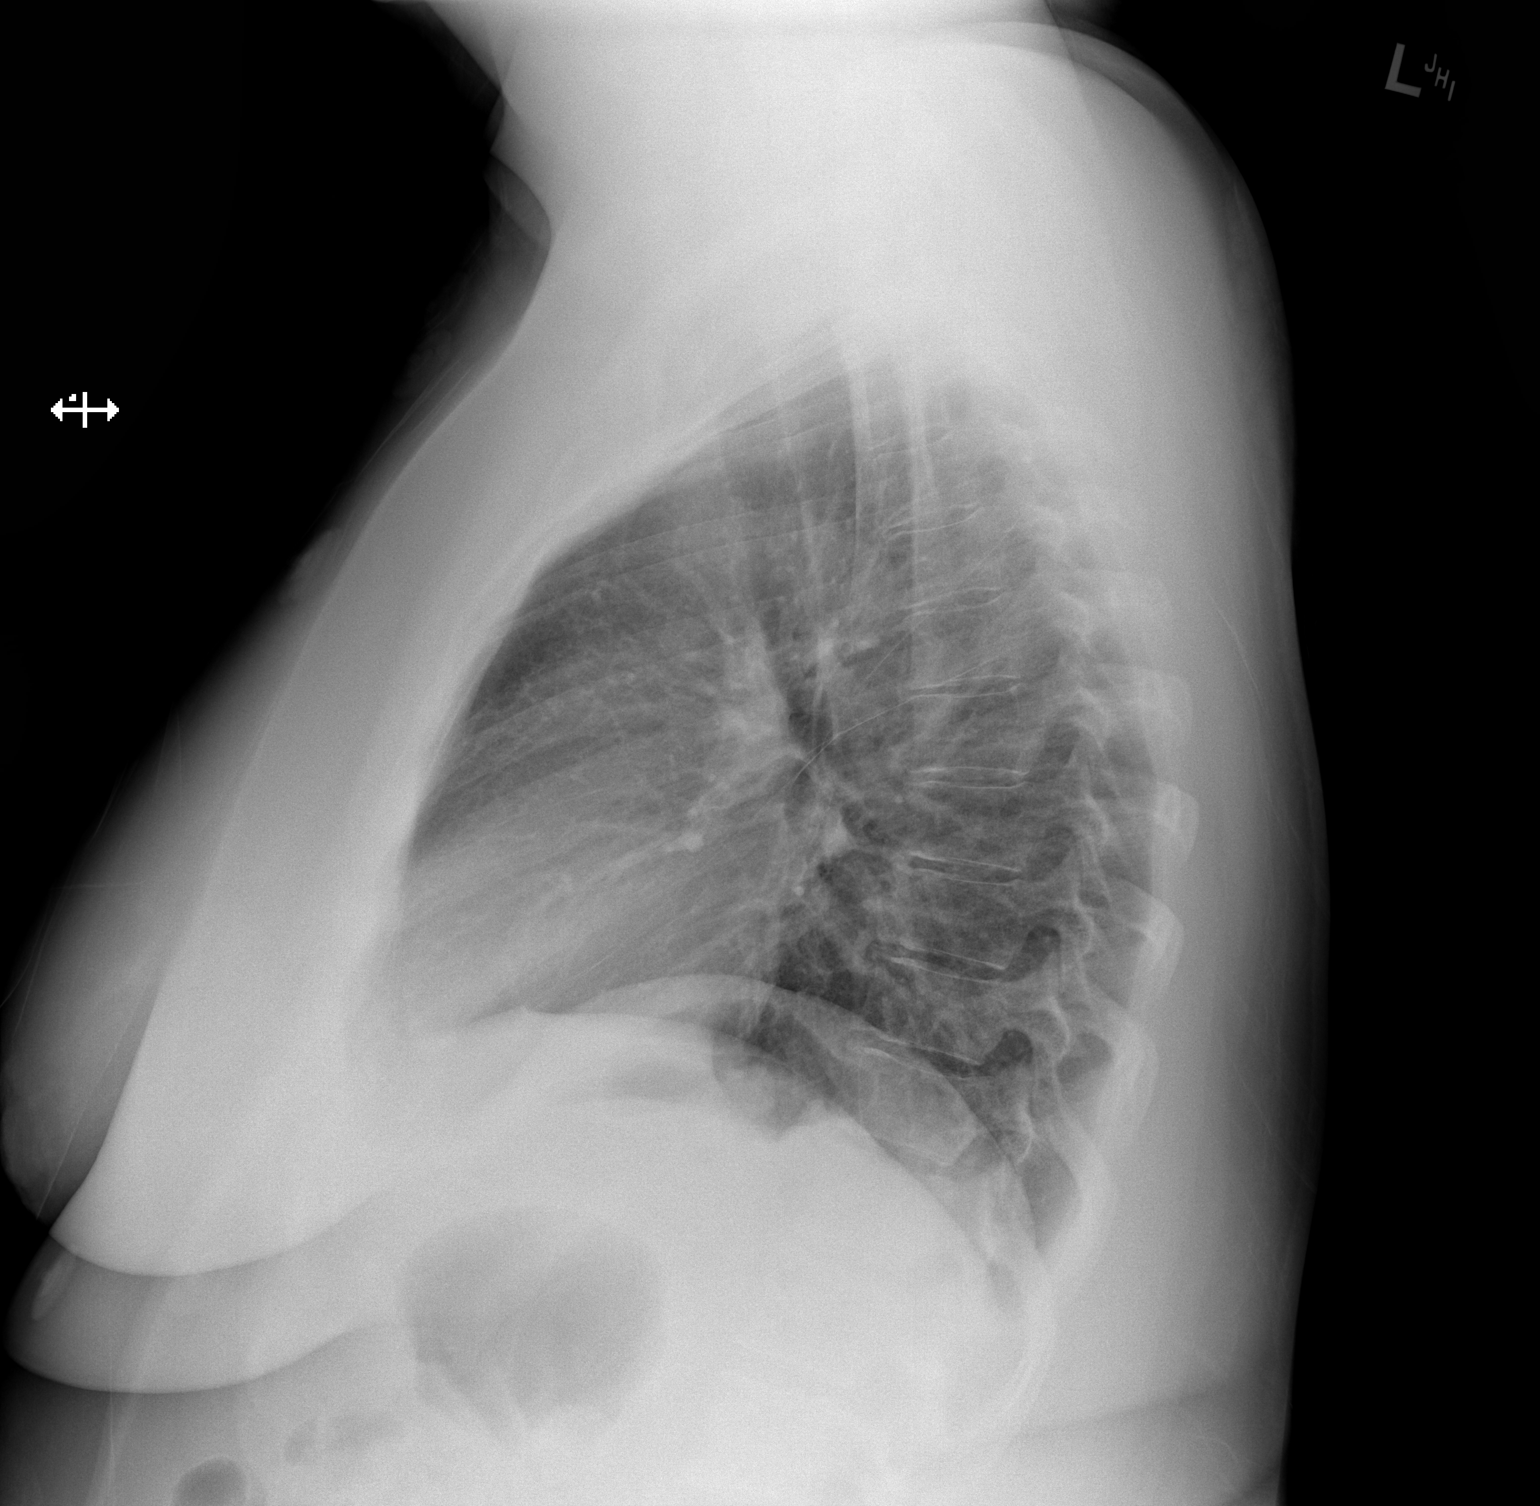

[2 of 2 positions shown; findings below may reference images not displayed]

FINDINGS: Lungs are adequately inflated without consolidation or effusion.
Cardiomediastinal silhouette is within normal. Bony structures are
normal. There are prominent overlying soft tissues.
IMPRESSION: No active cardiopulmonary disease.

## 2018-07-06 ENCOUNTER — Encounter (HOSPITAL_COMMUNITY): Payer: Self-pay | Admitting: Emergency Medicine

## 2018-07-06 ENCOUNTER — Emergency Department (HOSPITAL_COMMUNITY)
Admission: EM | Admit: 2018-07-06 | Discharge: 2018-07-07 | Disposition: A | Discharge status: Home / Self Care | Payer: Self-pay | Attending: Emergency Medicine | Admitting: Emergency Medicine

## 2018-07-06 ENCOUNTER — Other Ambulatory Visit: Payer: Self-pay

## 2018-07-06 DIAGNOSIS — Z79899 Other long term (current) drug therapy: Secondary | ICD-10-CM

## 2018-07-06 DIAGNOSIS — R569 Unspecified convulsions: Secondary | ICD-10-CM | POA: Insufficient documentation

## 2018-07-06 DIAGNOSIS — R51 Headache: Secondary | ICD-10-CM | POA: Insufficient documentation

## 2018-07-06 DIAGNOSIS — T4276XA Underdosing of unspecified antiepileptic and sedative-hypnotic drugs, initial encounter: Secondary | ICD-10-CM | POA: Insufficient documentation

## 2018-07-06 DIAGNOSIS — Z87891 Personal history of nicotine dependence: Secondary | ICD-10-CM | POA: Insufficient documentation

## 2018-07-06 MED ORDER — LORAZEPAM 2 MG/ML IJ SOLN
2.0000 mg | Freq: Once | INTRAMUSCULAR | Status: AC
  Administered 2018-07-07: 2 mg via INTRAVENOUS
  Filled 2018-07-06: qty 1

## 2018-07-06 NOTE — ED Notes (Signed)
Bed: HB71 Expected date:  Expected time:  Means of arrival:  Comments: Seizures

## 2018-07-06 NOTE — ED Provider Notes (Addendum)
[facility] DEPT Provider Note: Georgena Spurling, MD, FACEP  CSN: [id number] MRN: [id number] ARRIVAL: 07/06/18 at 2318 ROOM: WA24/WA24   CHIEF COMPLAINT  Seizure   HISTORY OF PRESENT ILLNESS  07/06/18 11:25 PM Nicole [name] is a 42 y.o. female with a seizure disorder.  She is supposed to be taking Keppra but has reportedly been out of it for 3 weeks.  She was brought here by EMS after witnessed seizure.  She reportedly got into an argument with her husband and went outside.  She fell backwards due to generalized tonic-clonic activity.  She reportedly hit the back of her head.  EMS reports postictal confusion and she was placed in a cervical collar as a precaution.  She is complaining of headache in the occipital region.  She denies neck pain.  EMS reports she believes she is in [place] and the year is 2017.   Past Medical History:  Diagnosis Date  . Anemia   . Asthma   . Atypical chest pain    a. ?pericarditis 2015.  Marland Kitchen Blood transfusion 2011   r/t anemia  . Fibroid   . Migraine   . Seizures ([place])   . Sickle cell trait ([place])   . Tooth abscess     Past Surgical History:  Procedure Laterality Date  . TUBAL LIGATION Bilateral 2005    Family History  Problem Relation Age of Onset  . Hypertension Mother   . Diabetes Mother   . Cancer Mother        Colon, brain, 2 other  . Asthma Mother   . Clotting disorder Mother   . Heart disease Mother        60, stents  . Stroke Mother        2012  . Diabetes Father   . Asthma Father   . Clotting disorder Father   . Sickle cell anemia Father   . Heart disease Father        2000, stents  . Hypertension Father   . Stroke Father        2007  . Diabetes Maternal Grandmother   . Asthma Sister   . Asthma Brother   . Asthma Paternal Grandmother   . Anesthesia problems Neg Hx     Social History   Tobacco Use  . Smoking status: Former Smoker    Last attempt to quit: 03/27/2014    Years since quitting: 4.2  . Smokeless  tobacco: Never Used  Substance Use Topics  . Alcohol use: No  . Drug use: No    Prior to Admission medications   Medication Sig Start Date End Date Taking? Authorizing Provider  acetaminophen-codeine (TYLENOL #3) 300-30 MG tablet Take 1 tablet by mouth every 4 (four) hours as needed for pain. 06/27/18  Yes [provider]  EPINEPHrine (EPIPEN 2-PAK) 0.3 mg/0.3 mL IJ SOAJ injection Inject 0.3 mg into the muscle as needed (for allergic reaction).   Yes [provider]  levETIRAcetam (KEPPRA) 1000 MG tablet Take 1 tablet (1,000 mg total) by mouth 2 (two) times daily. 08/19/17  Yes Antonietta Breach, PA-C  predniSONE (DELTASONE) 20 MG tablet Take 2 tablets (40 mg total) by mouth daily with breakfast. For the next four days 04/26/18  Yes Carmin Muskrat, MD  topiramate (TOPAMAX) 25 MG tablet Take 75 mg by mouth 2 (two) times daily.   Yes [provider]  clindamycin (CLEOCIN) 300 MG capsule Take 300 mg by mouth 3 (three) times daily. 06/27/18   [provider]    Allergies Penicillins; Shrimp [shellfish allergy]; Tea; and Sulfa antibiotics   REVIEW OF SYSTEMS     PHYSICAL EXAMINATION  Initial Vital Signs Blood pressure 127/79, pulse 62, temperature 98.1 F (36.7 C), temperature source Oral, resp. rate 16, SpO2 100 %.  Examination General: Well-developed, well-nourished female in no acute distress; appearance consistent with age of record HENT: normocephalic; occipital tenderness without palpable hematoma or crepitus; no hemotympanum Eyes: pupils equal, round and reactive to light; laterally tremulous eye movements Neck: Immobilized in cervical collar; nontender with no pain on passive range of motion Heart: regular rate and rhythm Lungs: clear to auscultation bilaterally Abdomen: soft; nondistended; nontender; bowel sounds present Extremities: No deformity; full range of motion; pulses normal Neurologic: Somnolent but arousable, oriented to person, believes  she is in [place] and the year is 2017; noted to move all extremities Skin: Warm and dry   RESULTS  Summary of this visit's results, reviewed by myself:   EKG Interpretation  Date/Time:    Ventricular Rate:    PR Interval:    QRS Duration:   QT Interval:    QTC Calculation:   R Axis:     Text Interpretation:        Laboratory Studies: No results found for this or any previous visit (from the past 24 hour(s)). Imaging Studies: Ct Head Wo Contrast  Result Date: 07/07/2018 CLINICAL DATA:  Head trauma, subacute, neural/cognitive deficit. Witnessed seizure. EXAM: CT HEAD WITHOUT CONTRAST TECHNIQUE: Contiguous axial images were obtained from the base of the skull through the vertex without intravenous contrast. COMPARISON:  None. FINDINGS: Brain: No intracranial hemorrhage, mass effect, or midline shift. No hydrocephalus. The basilar cisterns are patent. No evidence of territorial infarct or acute ischemia. Incidental tentorial calcifications. No extra-axial or intracranial fluid collection. Vascular: No hyperdense vessel or unexpected calcification. Skull: No fracture or focal lesion. Sinuses/Orbits: Scattered mucosal thickening of the ethmoid air cells and sphenoid sinuses. Minimal opacification of lower left mastoid air cells. Other: None. IMPRESSION: 1.  No acute intracranial abnormality. 2. Mild paranasal sinus inflammatory change, of uncertain chronicity. Electronically Signed   By: Jeb Levering M.D.   On: 07/07/2018 05:49    ED COURSE and MDM  Nursing notes and initial vitals signs, including pulse oximetry, reviewed.  Vitals:   07/07/18 0400 07/07/18 0500 07/07/18 0630 07/07/18 0700  BP: 128/90 120/78 125/82 (!) 149/89  Pulse: 61 65 67 65  Resp: 19 16 17 19   Temp:      TempSrc:      SpO2: 100% 98% 100% 100%  Weight:      Height:       4:40 AM Patient has been sleeping, seizure-free, after Ativan 2 mg IV.  She is readily awakened but still believes it is 2017.   She still insists she has been out of her Keppra and is almost out of her Topamax.   7:15 AM Patient states she feels better.  She now knows that it is 2019.  She is requesting refills of her Keppra and Topamax.  She was loaded with Keppra IV in the ED.  PROCEDURES   CRITICAL CARE Performed by: Shanon Rosser L Total critical care time: 30 minutes Critical care time was exclusive of separately billable procedures and treating other patients. Critical care was necessary to treat or prevent imminent or life-threatening deterioration. Critical care was time spent personally by me on the following activities: development of treatment plan with patient and/or surrogate as well as nursing, discussions with  consultants, evaluation of patient's response to treatment, examination of patient, obtaining history from patient or surrogate, ordering and performing treatments and interventions, ordering and review of laboratory studies, ordering and review of radiographic studies, pulse oximetry and re-evaluation of patient's condition.   ED DIAGNOSES     ICD-10-CM   1. Seizure ([place]) R56.9   2. Seizure secondary to subtherapeutic anticonvulsant medication ([place]) R56.9    Z79.899        Shanon Rosser, MD 07/07/18 1798    Shanon Rosser, MD 07/23/18 0111

## 2018-07-06 NOTE — ED Triage Notes (Signed)
Pt BIB EMS from home s/p witnessed seizure. Patient got into an argument with her husband, went outside to cool down. Patient's husband witnessed patient fall backwards with seizure like activity and hit the back of her head. Patient is currently post-ictal.

## 2018-07-07 ENCOUNTER — Emergency Department (HOSPITAL_COMMUNITY): Payer: Self-pay

## 2018-07-07 MED ORDER — LEVETIRACETAM 1000 MG PO TABS: 1000.0000 mg | ORAL_TABLET | Freq: Two times a day (BID) | ORAL | 0 refills | Status: DC

## 2018-07-07 MED ORDER — TOPIRAMATE 25 MG PO TABS: 75.0000 mg | ORAL_TABLET | Freq: Two times a day (BID) | ORAL | 0 refills | Status: DC

## 2018-07-07 MED ORDER — LEVETIRACETAM IN NACL 1000 MG/100ML IV SOLN
1000.0000 mg | Freq: Once | INTRAVENOUS | Status: AC
  Administered 2018-07-07: 1000 mg via INTRAVENOUS
  Filled 2018-07-07: qty 100

## 2018-07-07 NOTE — ED Notes (Signed)
Patient transported to CT 

## 2018-07-08 ENCOUNTER — Other Ambulatory Visit: Payer: Self-pay | Admitting: Physician Assistant

## 2018-08-30 IMAGING — CR DG CHEST 2V
2 series · 2 of 2 positions shown · non-contrast
Comparison: 09/12/2017

CLINICAL DATA: Cough, congestion

EXAM:
CHEST  2 VIEW

[w chest pa]
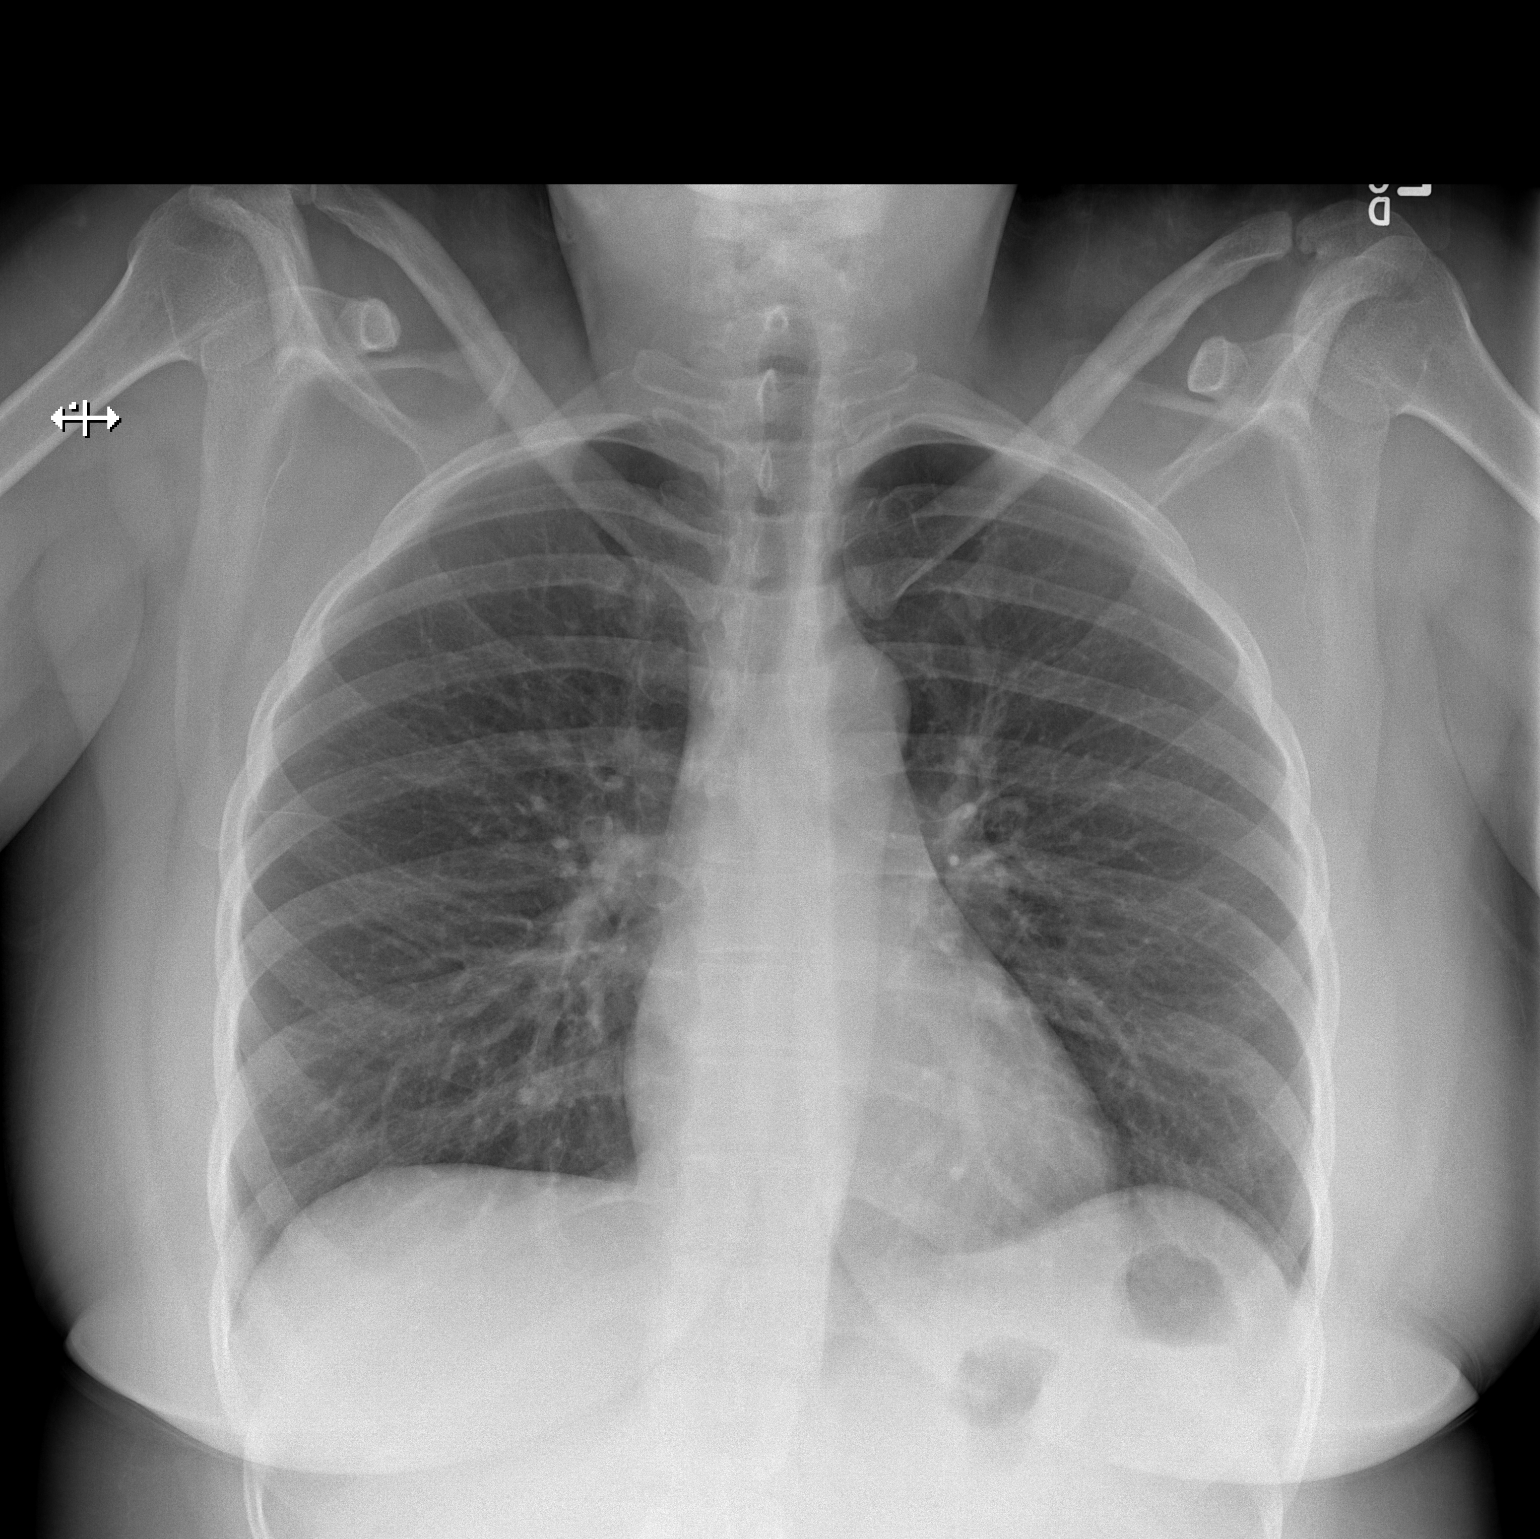

[w chest lat]
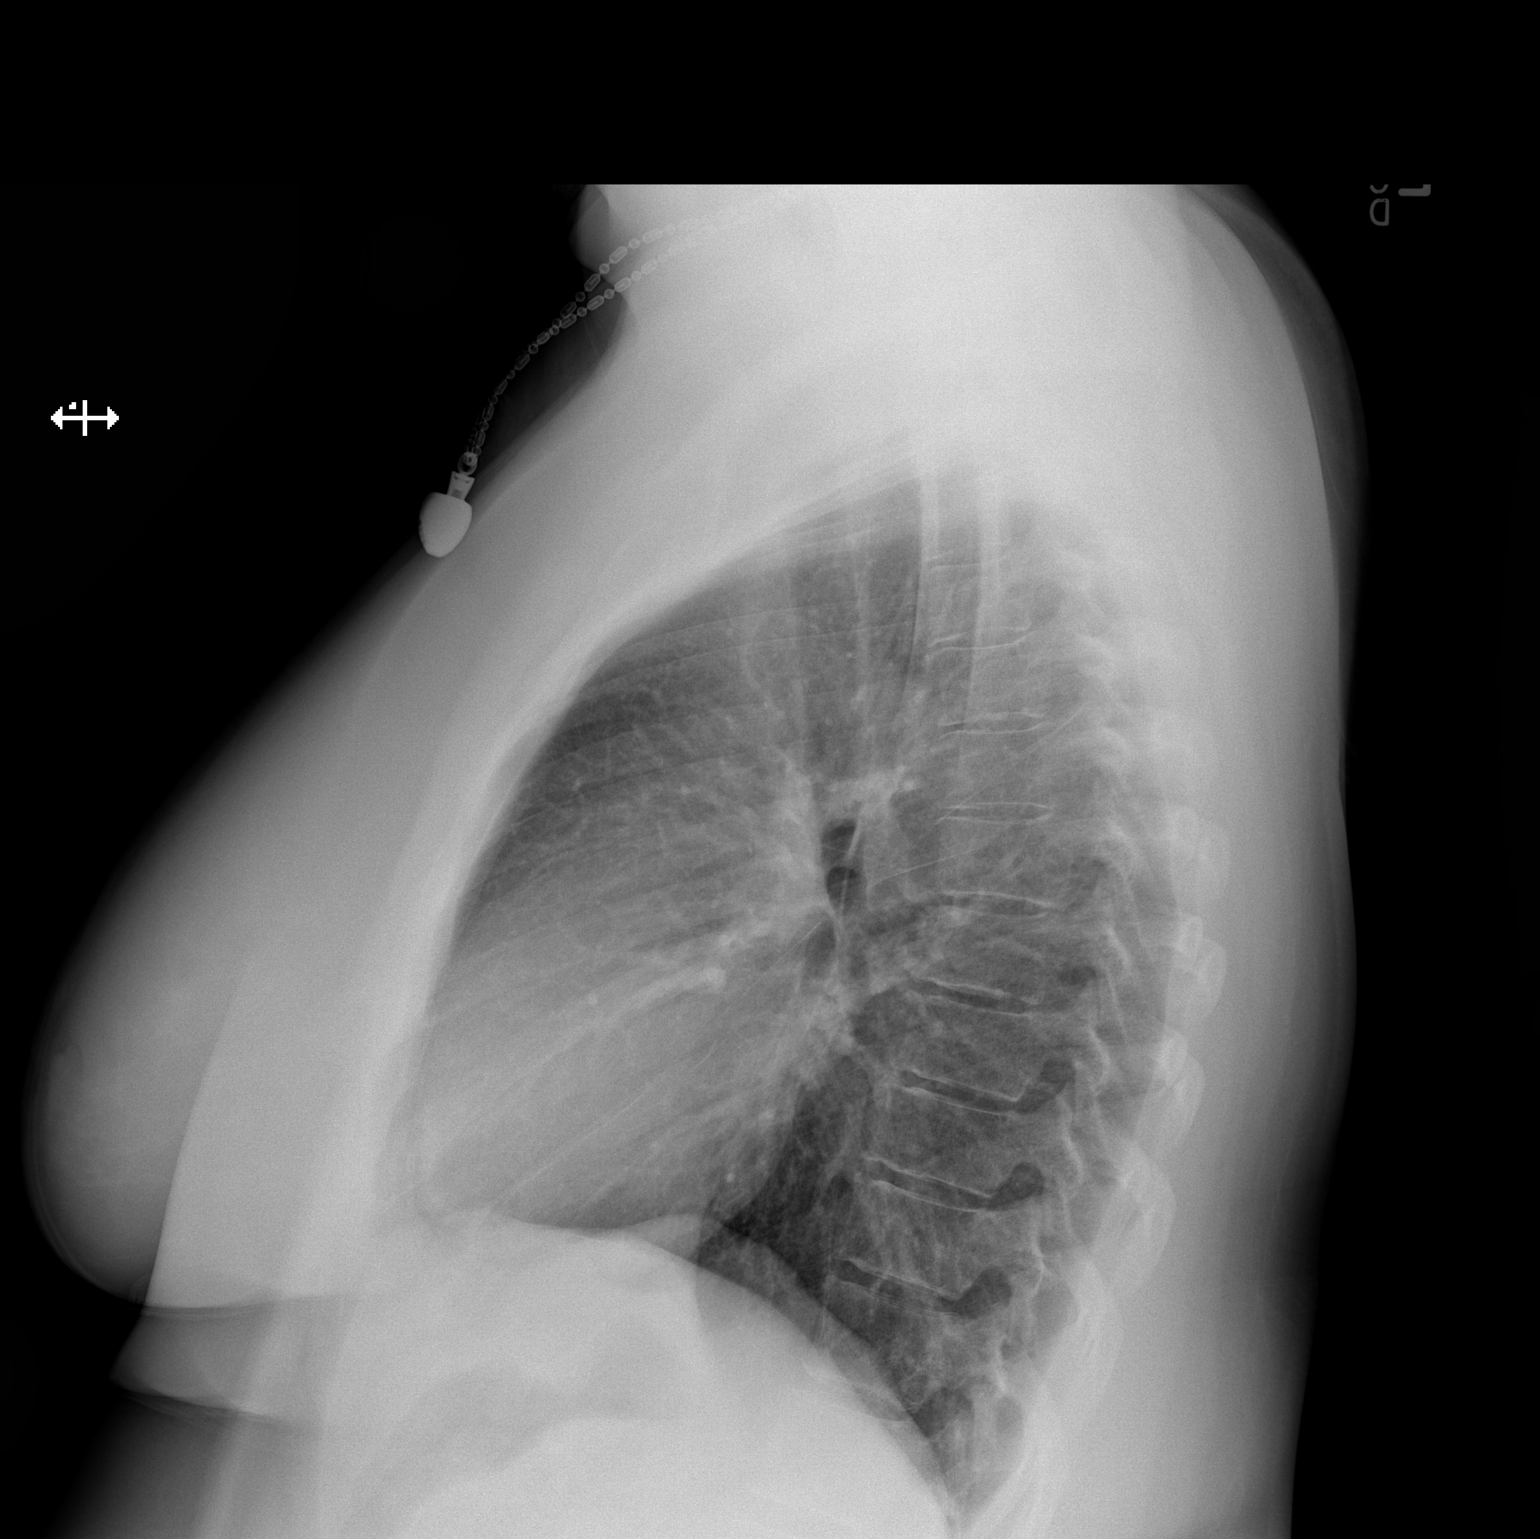

[2 of 2 positions shown; findings below may reference images not displayed]

FINDINGS: Heart and mediastinal contours are within normal limits. No focal
opacities or effusions. No acute bony abnormality.
IMPRESSION: No active cardiopulmonary disease.

## 2018-10-04 IMAGING — DX DG CHEST 2V
2 series · 2 of 2 positions shown · non-contrast
Comparison: 01/01/2018

CLINICAL DATA: Chest pain

EXAM:
CHEST  2 VIEW

[chest pa]
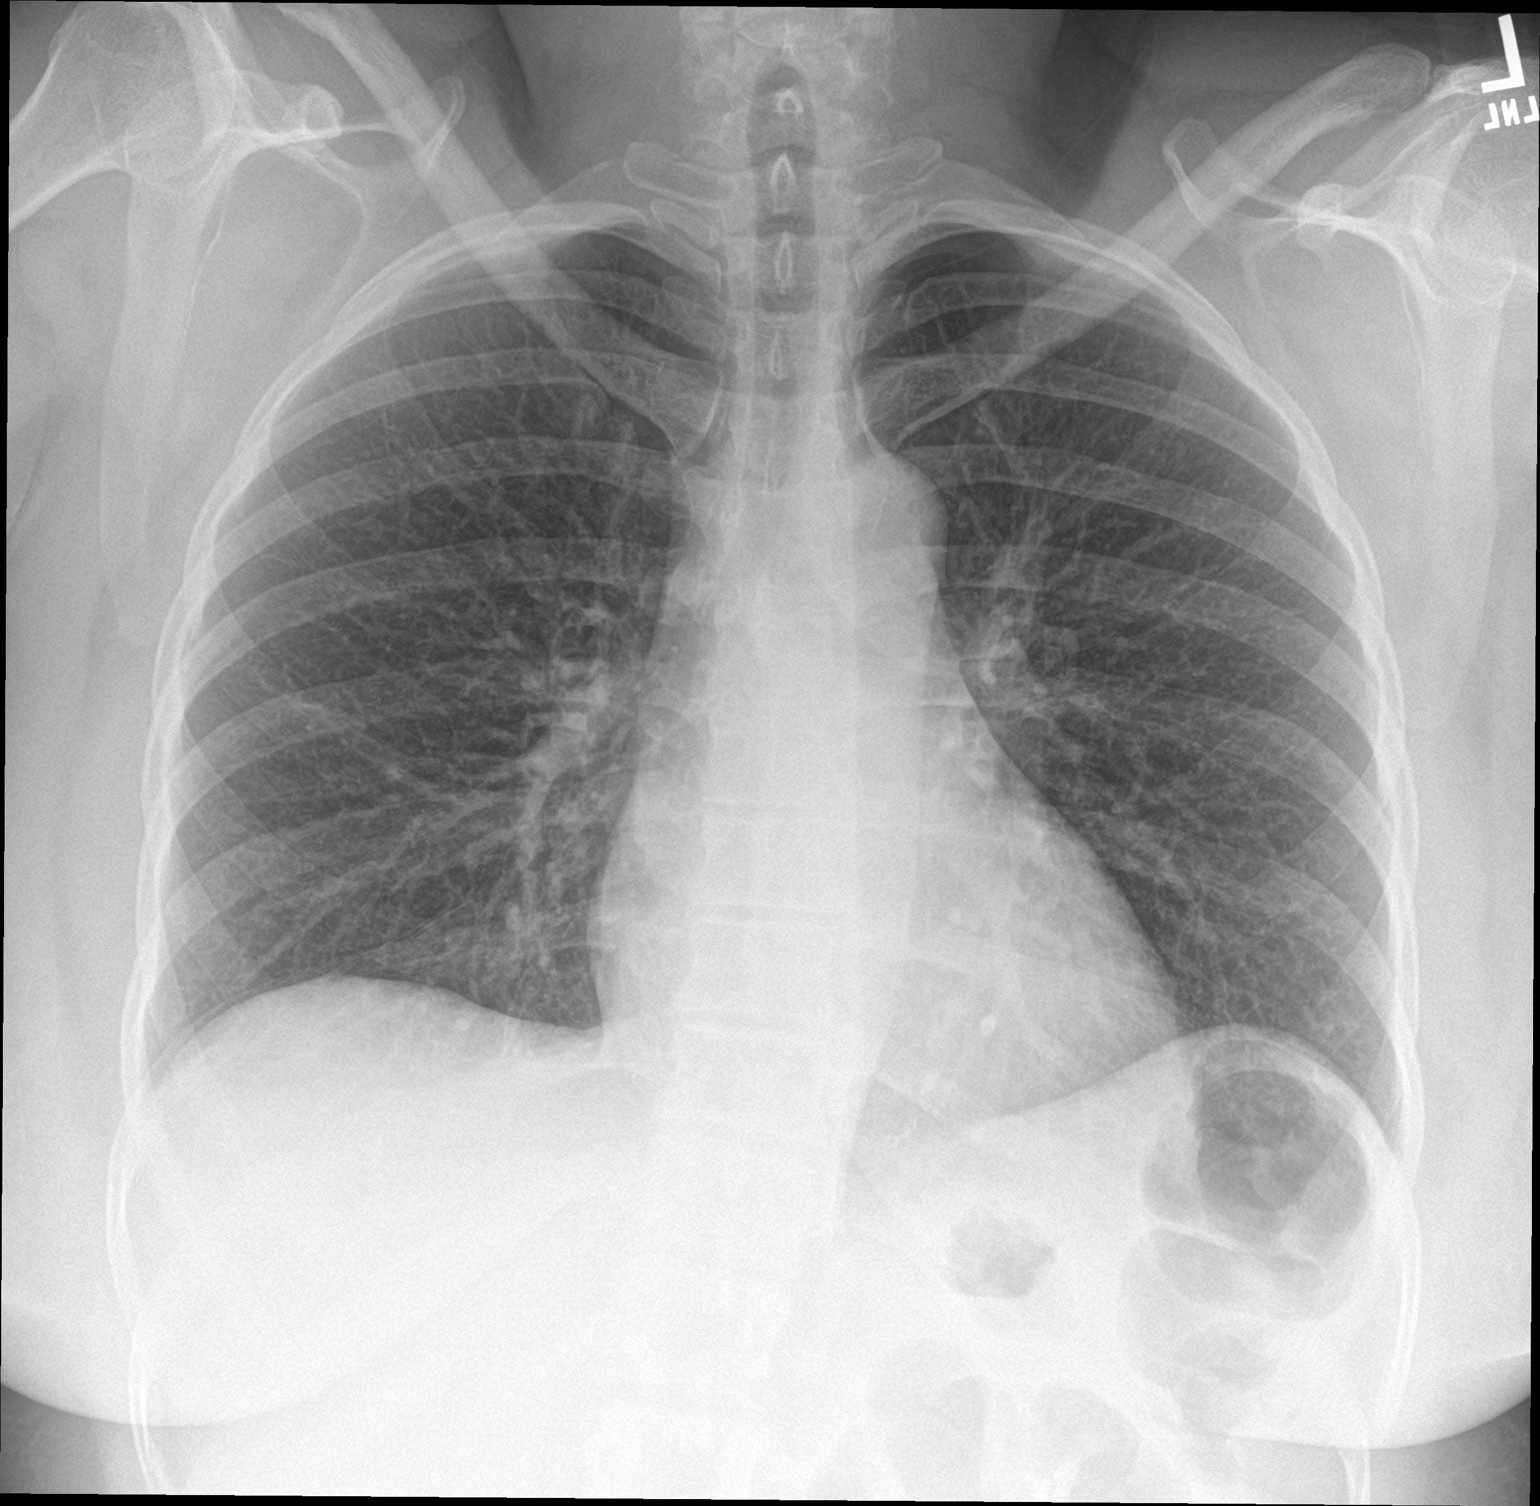

[chest lat]
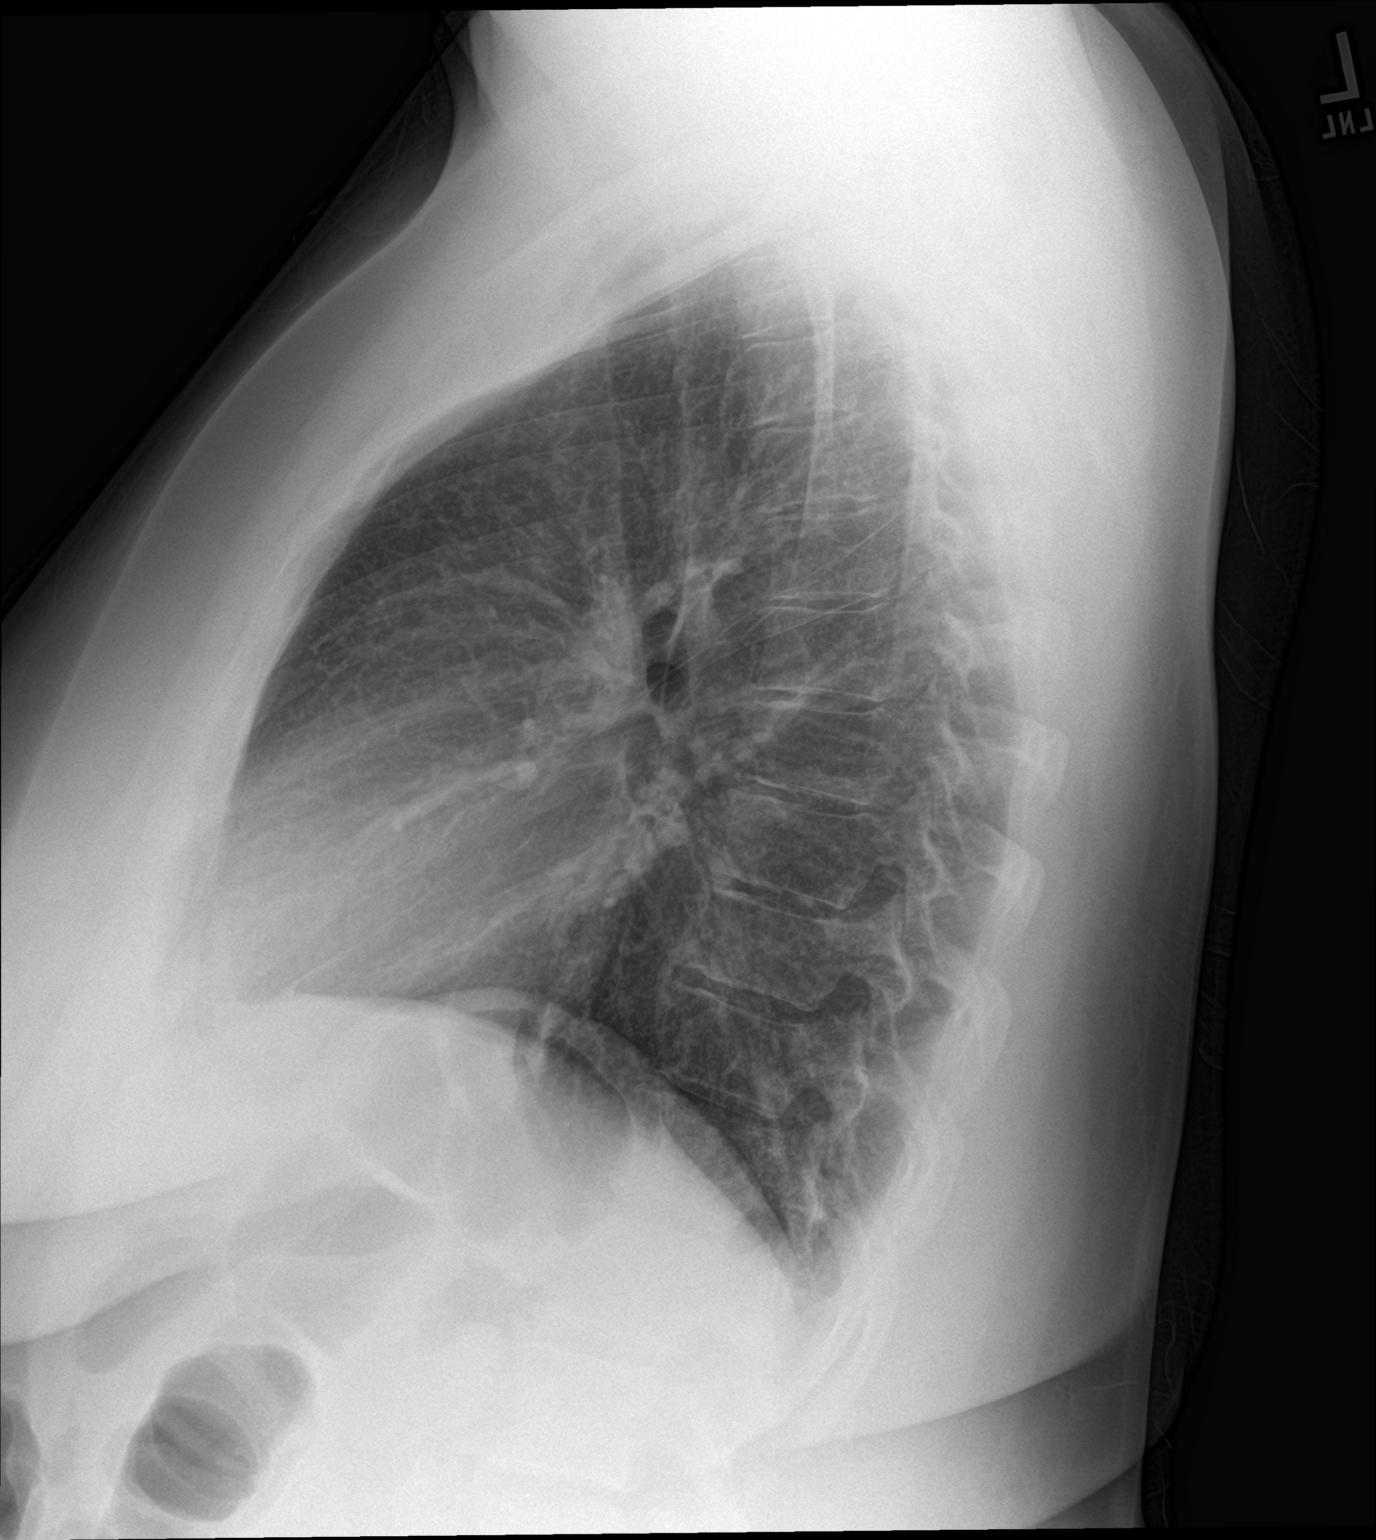

[2 of 2 positions shown; findings below may reference images not displayed]

FINDINGS: Heart and mediastinal contours are within normal limits. No focal
opacities or effusions. No acute bony abnormality.
IMPRESSION: No active cardiopulmonary disease.

## 2018-10-09 ENCOUNTER — Encounter (HOSPITAL_COMMUNITY): Payer: Self-pay

## 2018-10-09 ENCOUNTER — Emergency Department (HOSPITAL_COMMUNITY): Payer: Self-pay

## 2018-10-09 ENCOUNTER — Emergency Department (HOSPITAL_COMMUNITY)
Admission: EM | Admit: 2018-10-09 | Discharge: 2018-10-09 | Disposition: A | Discharge status: Home / Self Care | Payer: Self-pay | Attending: Emergency Medicine | Admitting: Emergency Medicine

## 2018-10-09 ENCOUNTER — Other Ambulatory Visit: Payer: Self-pay

## 2018-10-09 DIAGNOSIS — R079 Chest pain, unspecified: Secondary | ICD-10-CM | POA: Insufficient documentation

## 2018-10-09 DIAGNOSIS — Z87891 Personal history of nicotine dependence: Secondary | ICD-10-CM | POA: Insufficient documentation

## 2018-10-09 DIAGNOSIS — J45909 Unspecified asthma, uncomplicated: Secondary | ICD-10-CM | POA: Insufficient documentation

## 2018-10-09 DIAGNOSIS — R509 Fever, unspecified: Secondary | ICD-10-CM | POA: Insufficient documentation

## 2018-10-09 LAB — CBC WITH DIFFERENTIAL/PLATELET
ABS IMMATURE GRANULOCYTES: 0.02 10*3/uL (ref 0.00–0.07)
BASOS PCT: 1 %
Basophils Absolute: 0.1 10*3/uL (ref 0.0–0.1)
EOS ABS: 0.4 10*3/uL (ref 0.0–0.5)
Eosinophils Relative: 3 %
HCT: 37.6 % (ref 36.0–46.0)
Hemoglobin: 12.1 g/dL (ref 12.0–15.0)
Immature Granulocytes: 0 %
Lymphocytes Relative: 32 %
Lymphs Abs: 3.5 10*3/uL (ref 0.7–4.0)
MCH: 28.9 pg (ref 26.0–34.0)
MCHC: 32.2 g/dL (ref 30.0–36.0)
MCV: 90 fL (ref 80.0–100.0)
MONO ABS: 0.7 10*3/uL (ref 0.1–1.0)
MONOS PCT: 6 %
NEUTROS ABS: 6.3 10*3/uL (ref 1.7–7.7)
Neutrophils Relative %: 58 %
PLATELETS: 293 10*3/uL (ref 150–400)
RBC: 4.18 MIL/uL (ref 3.87–5.11)
RDW: 12.7 % (ref 11.5–15.5)
WBC: 11 10*3/uL — AB (ref 4.0–10.5)
nRBC: 0 % (ref 0.0–0.2)

## 2018-10-09 LAB — URINALYSIS, ROUTINE W REFLEX MICROSCOPIC
Bilirubin Urine: NEGATIVE
GLUCOSE, UA: NEGATIVE mg/dL
KETONES UR: NEGATIVE mg/dL
NITRITE: NEGATIVE
PROTEIN: NEGATIVE mg/dL
Specific Gravity, Urine: 1.009 (ref 1.005–1.030)
pH: 7 (ref 5.0–8.0)

## 2018-10-09 LAB — COMPREHENSIVE METABOLIC PANEL
ALT: 14 U/L (ref 0–44)
AST: 20 U/L (ref 15–41)
Albumin: 3.8 g/dL (ref 3.5–5.0)
Alkaline Phosphatase: 63 U/L (ref 38–126)
Anion gap: 6 (ref 5–15)
BUN: 5 mg/dL — AB (ref 6–20)
CHLORIDE: 107 mmol/L (ref 98–111)
CO2: 26 mmol/L (ref 22–32)
Calcium: 8.7 mg/dL — ABNORMAL LOW (ref 8.9–10.3)
Creatinine, Ser: 0.74 mg/dL (ref 0.44–1.00)
GFR calc Af Amer: >60 mL/min (ref >60)
Glucose, Bld: 80 mg/dL (ref 70–99)
Potassium: 4 mmol/L (ref 3.5–5.1)
SODIUM: 139 mmol/L (ref 135–145)
Total Bilirubin: 0.5 mg/dL (ref 0.3–1.2)
Total Protein: 6.9 g/dL (ref 6.5–8.1)

## 2018-10-09 LAB — D-DIMER, QUANTITATIVE: D-Dimer, Quant: 0.36 ug/mL-FEU (ref 0.00–0.50)

## 2018-10-09 MED ORDER — SODIUM CHLORIDE 0.9 % IV SOLN
500.0000 mg | Freq: Once | INTRAVENOUS | Status: AC
  Administered 2018-10-09: 500 mg via INTRAVENOUS
  Filled 2018-10-09: qty 500

## 2018-10-09 MED ORDER — IPRATROPIUM-ALBUTEROL 0.5-2.5 (3) MG/3ML IN SOLN
3.0000 mL | Freq: Once | RESPIRATORY_TRACT | Status: AC
  Administered 2018-10-09: 3 mL via RESPIRATORY_TRACT
  Filled 2018-10-09: qty 3

## 2018-10-09 MED ORDER — IBUPROFEN 800 MG PO TABS: 800.0000 mg | ORAL_TABLET | Freq: Three times a day (TID) | ORAL | 0 refills | Status: DC

## 2018-10-09 MED ORDER — ACETAMINOPHEN 500 MG PO TABS
1000.0000 mg | ORAL_TABLET | Freq: Once | ORAL | Status: AC
  Administered 2018-10-09: 1000 mg via ORAL
  Filled 2018-10-09: qty 2

## 2018-10-09 MED ORDER — AZITHROMYCIN 250 MG PO TABS: 250.0000 mg | ORAL_TABLET | Freq: Every day | ORAL | 0 refills | Status: DC

## 2018-10-09 MED ORDER — OXYCODONE-ACETAMINOPHEN 5-325 MG PO TABS
2.0000 | ORAL_TABLET | Freq: Once | ORAL | Status: AC
  Administered 2018-10-09: 2 via ORAL
  Filled 2018-10-09: qty 2

## 2018-10-09 MED ORDER — LACTATED RINGERS IV BOLUS
1000.0000 mL | Freq: Once | INTRAVENOUS | Status: AC
  Administered 2018-10-09: 1000 mL via INTRAVENOUS

## 2018-10-09 NOTE — ED Notes (Signed)
ED Provider at bedside. 

## 2018-10-09 NOTE — ED Triage Notes (Signed)
Pt states she has had a fever at home for several days, going up to 102.3, and she last took Tylenol today at 0530. Pt states chest pain, radiating down right arm, for approximately 2 days.

## 2018-10-09 NOTE — ED Notes (Signed)
RX X 1 AND PICK UP AT PHARMACY

## 2018-10-09 NOTE — ED Notes (Signed)
PT REQUESTED IV RATE HURTING ARM. IV NOT INFILTRATED. DECREASED RATE. PT TOLERATING INFUSION. NO S/S OF REACTION TO MEDICATION. PT CONTINUES WITHOUT COMPLAINT. PT REQUESTING 2ND BREATHING TREATMENT BEFORE DISCHARGE

## 2018-10-09 NOTE — ED Notes (Signed)
Bed: WA20 Expected date:  Expected time:  Means of arrival:  Comments: H[place]

## 2018-10-09 NOTE — ED Notes (Signed)
ED Provider at bedside. EDP MESNER ATTEMPTING TO PLACE [place] IV WITH BLOOD DRAW

## 2018-10-09 NOTE — ED Notes (Signed)
ATTEMPTED [place] IV WITH BLOOD DRAW. NOT SUCCESSFUL. REQUESTED ROYCE RN TO ASSISTED. HE STATES IN ROUTE AFTER ATTENDED TO ANOTHER PT. PT STABLE

## 2018-10-09 NOTE — ED Notes (Signed)
Patient is resting comfortably. 

## 2018-10-09 NOTE — ED Provider Notes (Signed)
Emergency Department Provider Note   I have reviewed the triage vital signs and the nursing notes.   HISTORY  Chief Complaint Fever and Chest Pain   HPI Nicole [name] is a 42 y.o. female with multiple medical problems document below the presents emergency department today with 2 to 3 days of fever, right-sided chest pain that radiates down her right arm, pleuritic component and some intermittent shortness of breath.  Patient says that she is no longer on any birth control.  No trauma.  Has temperature 102.3 at home but no cough.  Has tried her inhaler without relief.  Has not tried any other medications.  No rashes.  No history of the same.  States the discomfort keeps her up at night. No other associated or modifying symptoms.    Past Medical History:  Diagnosis Date  . Anemia   . Asthma   . Atypical chest pain    a. ?pericarditis 2015.  Marland Kitchen Blood transfusion 2011   r/t anemia  . Fibroid   . Migraine   . Seizures ([place])   . Sickle cell trait ([place])   . Tooth abscess     Patient Active Problem List   Diagnosis Date Noted  . 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 Depression   . Generalized abdominal pain   . Fatigue 10/28/2015  . Adjustment disorder with depressed mood 10/21/2015  . Dizziness   . Weakness 11/18/2014  . OSA (obstructive sleep apnea) 10/21/2014  . Acute rhinosinusitis 10/09/2014  . Leg pain, lateral 09/04/2014  . Migraines 08/13/2014  . Seizure ([place]) 06/04/2014  . Leiomyoma of uterus, unspecified 02/11/2014  . GERD (gastroesophageal reflux disease) 01/28/2014  . Candidiasis of vulva and vagina 01/28/2014  . Unspecified symptom associated with female genital organs 01/28/2014  . Chest pain 01/20/2014  . Acute pericarditis, unspecified 01/20/2014    Past Surgical History:  Procedure Laterality Date  . TUBAL LIGATION Bilateral 2005    Current Outpatient Rx  . Order #: [id number] Class: Historical Med  . Order #: [id number] Class: Historical Med  . Order #:  [id number] Class: Historical Med  . Order #: [id number] Class: Normal  . Order #: [id number] Class: Normal  . Order #: [id number] Class: Print  . Order #: [id number] Class: Normal    Allergies Penicillins; Shrimp [shellfish allergy]; Tea; and Sulfa antibiotics  Family History  Problem Relation Age of Onset  . Hypertension Mother   . Diabetes Mother   . Cancer Mother        Colon, brain, 2 other  . Asthma Mother   . Clotting disorder Mother   . Heart disease Mother        75, stents  . Stroke Mother        2012  . Diabetes Father   . Asthma Father   . Clotting disorder Father   . Sickle cell anemia Father   . Heart disease Father        2000, stents  . Hypertension Father   . Stroke Father        2007  . Diabetes Maternal Grandmother   . Asthma Sister   . Asthma Brother   . Asthma Paternal Grandmother   . Anesthesia problems Neg Hx     Social History Social History   Tobacco Use  . Smoking status: Former Smoker    Last attempt to quit: 03/27/2014    Years since quitting: 4.5  . Smokeless tobacco: Never Used  Substance Use Topics  . Alcohol use: No  . Drug use:  No    Review of Systems  All other systems negative except as documented in the HPI. All pertinent positives and negatives as reviewed in the HPI. ____________________________________________   PHYSICAL EXAM:  VITAL SIGNS: ED Triage Vitals  Enc Vitals Group     BP 10/09/18 0855 136/85     Pulse Rate 10/09/18 0855 90     Resp 10/09/18 0855 17     Temp 10/09/18 0855 98.2 F (36.8 C)     Temp Source 10/09/18 0855 Oral     SpO2 10/09/18 0855 100 %     Weight 10/09/18 0859 238 lb (108 kg)     Height 10/09/18 0859 5\' 5"  (1.651 m)     Head Circumference --      Peak Flow --      Pain Score 10/09/18 0859 8     Pain Loc --      Pain Edu? --      Excl. in [place]? --     Constitutional: Alert and oriented. Well appearing and in no acute distress. Eyes: Conjunctivae are normal. PERRL. EOMI. Head:  Atraumatic. Nose: No congestion/rhinnorhea. Mouth/Throat: Mucous membranes are moist.  Oropharynx non-erythematous. Neck: No stridor.  No meningeal signs.   Cardiovascular: Normal rate, regular rhythm. Good peripheral circulation. Grossly normal heart sounds.   Respiratory: Normal respiratory effort.  No retractions. Lungs CTAB. Gastrointestinal: Soft and nontender. No distention.  Musculoskeletal: No lower extremity tenderness nor edema. No gross deformities of extremities. Neurologic:  Normal speech and language. No gross focal neurologic deficits are appreciated.  Skin:  Skin is warm, dry and intact. No rash noted.   ____________________________________________   LABS (all labs ordered are listed, but only abnormal results are displayed)  Labs Reviewed  CBC WITH DIFFERENTIAL/PLATELET - Abnormal; Notable for the following components:      Result Value   WBC 11.0 (*)    All other components within normal limits  COMPREHENSIVE METABOLIC PANEL - Abnormal; Notable for the following components:   BUN 5 (*)    Calcium 8.7 (*)    All other components within normal limits  URINALYSIS, ROUTINE W REFLEX MICROSCOPIC - Abnormal; Notable for the following components:   Hgb urine dipstick MODERATE (*)    Leukocytes, UA MODERATE (*)    Bacteria, UA FEW (*)    All other components within normal limits  D-DIMER, QUANTITATIVE (NOT AT [facility])   ____________________________________________  EKG   EKG Interpretation  Date/Time:  Wednesday October 09 2018 08:53:53 EDT Ventricular Rate:  89 PR Interval:    QRS Duration: 82 QT Interval:  358 QTC Calculation: 436 R Axis:   73 Text Interpretation:  Sinus rhythm No significant change since last tracing Confirmed by Merrily Pew [phone]) on 10/09/2018 9:22:31 AM Also confirmed by Merrily Pew [phone]), editor Oswaldo Milian, [place] ([id number])  on 10/09/2018 12:24:17 PM       ____________________________________________  RADIOLOGY  Dg Chest 2  View  Result Date: 10/09/2018 CLINICAL DATA:  Chest pain and fever EXAM: CHEST - 2 VIEW COMPARISON:  None. FINDINGS: Lungs are clear. Heart size and pulmonary vascularity are normal. No adenopathy. No pneumothorax. No bone lesions. IMPRESSION: No edema or consolidation. Electronically Signed   By: Lowella Grip III M.D.   On: 10/09/2018 10:16    ____________________________________________   PROCEDURES  Procedure(s) performed:   Procedures  Angiocath insertion Performed by: Merrily Pew  Consent: Verbal consent obtained. Risks and benefits: risks, benefits and alternatives were discussed Time out: Immediately prior to procedure  a "time out" was called to verify the correct patient, procedure, equipment, support staff and site/side marked as required.  Preparation: Patient was prepped and draped in the usual sterile fashion.  Vein Location: L basilic  Ultrasound Guided  Gauge: 20  Normal blood return and flush without difficulty Patient tolerance: Patient tolerated the procedure well with no immediate complications.    ____________________________________________   INITIAL IMPRESSION / ASSESSMENT AND PLAN / ED COURSE  Pneumonia versus PE versus musculoskeletal.  Will evaluate appropriately.  Workup as above. Suspect community acquired pneumonia at this time. Will treat for same.   Pertinent labs & imaging results that were available during my care of the patient were reviewed by me and considered in my medical decision making (see chart for details).  ____________________________________________  FINAL CLINICAL IMPRESSION(S) / ED DIAGNOSES  Final diagnoses:  Fever, unspecified fever cause  Chest pain, unspecified type     MEDICATIONS GIVEN DURING THIS VISIT:  Medications  ipratropium-albuterol (DUONEB) 0.5-2.5 (3) MG/3ML nebulizer solution 3 mL (3 mLs Nebulization Given 10/09/18 1101)  lactated ringers bolus 1,000 mL (1,000 mLs Intravenous New Bag/Given  10/09/18 1120)  azithromycin (ZITHROMAX) 500 mg in sodium chloride 0.9 % 250 mL IVPB ( Intravenous Rate/Dose Verify 10/09/18 1504)  acetaminophen (TYLENOL) tablet 1,000 mg (1,000 mg Oral Given 10/09/18 1427)  ipratropium-albuterol (DUONEB) 0.5-2.5 (3) MG/3ML nebulizer solution 3 mL (3 mLs Nebulization Given 10/09/18 1512)     NEW OUTPATIENT MEDICATIONS STARTED DURING THIS VISIT:  New Prescriptions   AZITHROMYCIN (ZITHROMAX) 250 MG TABLET    Take 1 tablet (250 mg total) by mouth daily. Take 1 every day until finished.   IBUPROFEN (ADVIL,MOTRIN) 800 MG TABLET    Take 1 tablet (800 mg total) by mouth 3 (three) times daily.    Note:  This note was prepared with assistance of Dragon voice recognition software. Occasional wrong-word or sound-a-like substitutions may have occurred due to the inherent limitations of voice recognition software.   Merrily Pew, MD 10/09/18 1537

## 2018-10-14 ENCOUNTER — Emergency Department (HOSPITAL_COMMUNITY): Payer: Self-pay

## 2018-10-14 ENCOUNTER — Emergency Department (HOSPITAL_COMMUNITY)
Admission: EM | Admit: 2018-10-14 | Discharge: 2018-10-14 | Disposition: A | Discharge status: Home / Self Care | Payer: Self-pay | Attending: Emergency Medicine | Admitting: Emergency Medicine

## 2018-10-14 ENCOUNTER — Encounter (HOSPITAL_COMMUNITY): Payer: Self-pay | Admitting: Emergency Medicine

## 2018-10-14 DIAGNOSIS — R1084 Generalized abdominal pain: Secondary | ICD-10-CM

## 2018-10-14 DIAGNOSIS — J45909 Unspecified asthma, uncomplicated: Secondary | ICD-10-CM | POA: Insufficient documentation

## 2018-10-14 DIAGNOSIS — Z79899 Other long term (current) drug therapy: Secondary | ICD-10-CM | POA: Insufficient documentation

## 2018-10-14 DIAGNOSIS — R51 Headache: Secondary | ICD-10-CM | POA: Insufficient documentation

## 2018-10-14 DIAGNOSIS — R519 Headache, unspecified: Secondary | ICD-10-CM

## 2018-10-14 DIAGNOSIS — N920 Excessive and frequent menstruation with regular cycle: Secondary | ICD-10-CM

## 2018-10-14 DIAGNOSIS — R55 Syncope and collapse: Secondary | ICD-10-CM

## 2018-10-14 DIAGNOSIS — R05 Cough: Secondary | ICD-10-CM | POA: Insufficient documentation

## 2018-10-14 DIAGNOSIS — Z87891 Personal history of nicotine dependence: Secondary | ICD-10-CM | POA: Insufficient documentation

## 2018-10-14 DIAGNOSIS — R059 Cough, unspecified: Secondary | ICD-10-CM

## 2018-10-14 DIAGNOSIS — E860 Dehydration: Secondary | ICD-10-CM

## 2018-10-14 LAB — D-DIMER, QUANTITATIVE: D-Dimer, Quant: 0.32 ug/mL-FEU (ref 0.00–0.50)

## 2018-10-14 LAB — CBC WITH DIFFERENTIAL/PLATELET
ABS IMMATURE GRANULOCYTES: 0.02 10*3/uL (ref 0.00–0.07)
BASOS PCT: 1 %
Basophils Absolute: 0.1 10*3/uL (ref 0.0–0.1)
Eosinophils Absolute: 0.3 10*3/uL (ref 0.0–0.5)
Eosinophils Relative: 4 %
HCT: 35.8 % — ABNORMAL LOW (ref 36.0–46.0)
HEMOGLOBIN: 11.6 g/dL — AB (ref 12.0–15.0)
IMMATURE GRANULOCYTES: 0 %
LYMPHS PCT: 38 %
Lymphs Abs: 2.4 10*3/uL (ref 0.7–4.0)
MCH: 29.1 pg (ref 26.0–34.0)
MCHC: 32.4 g/dL (ref 30.0–36.0)
MCV: 89.7 fL (ref 80.0–100.0)
Monocytes Absolute: 0.5 10*3/uL (ref 0.1–1.0)
Monocytes Relative: 7 %
NEUTROS ABS: 3.1 10*3/uL (ref 1.7–7.7)
NEUTROS PCT: 50 %
PLATELETS: 264 10*3/uL (ref 150–400)
RBC: 3.99 MIL/uL (ref 3.87–5.11)
RDW: 12.9 % (ref 11.5–15.5)
WBC: 6.3 10*3/uL (ref 4.0–10.5)
nRBC: 0 % (ref 0.0–0.2)

## 2018-10-14 LAB — COMPREHENSIVE METABOLIC PANEL
ALK PHOS: 64 U/L (ref 38–126)
ALT: 39 U/L (ref 0–44)
AST: 41 U/L (ref 15–41)
Albumin: 3.9 g/dL (ref 3.5–5.0)
Anion gap: 6 (ref 5–15)
BUN: 6 mg/dL (ref 6–20)
CO2: 27 mmol/L (ref 22–32)
CREATININE: 0.79 mg/dL (ref 0.44–1.00)
Calcium: 8.6 mg/dL — ABNORMAL LOW (ref 8.9–10.3)
Chloride: 106 mmol/L (ref 98–111)
GFR calc Af Amer: >60 mL/min (ref >60)
GFR calc non Af Amer: >60 mL/min (ref >60)
GLUCOSE: 94 mg/dL (ref 70–99)
Potassium: 3.8 mmol/L (ref 3.5–5.1)
SODIUM: 139 mmol/L (ref 135–145)
Total Bilirubin: 0.4 mg/dL (ref 0.3–1.2)
Total Protein: 7.5 g/dL (ref 6.5–8.1)

## 2018-10-14 LAB — I-STAT BETA HCG BLOOD, ED (MC, WL, AP ONLY): I-stat hCG, quantitative: <5 m[IU]/mL (ref <5)

## 2018-10-14 LAB — I-STAT TROPONIN, ED: Troponin i, poc: 0 ng/mL (ref 0.00–0.08)

## 2018-10-14 MED ORDER — DIPHENHYDRAMINE HCL 50 MG/ML IJ SOLN
25.0000 mg | Freq: Once | INTRAMUSCULAR | Status: AC
  Administered 2018-10-14: 25 mg via INTRAVENOUS
  Filled 2018-10-14: qty 1

## 2018-10-14 MED ORDER — MAGNESIUM SULFATE 2 GM/50ML IV SOLN
2.0000 g | Freq: Once | INTRAVENOUS | Status: AC
  Administered 2018-10-14: 2 g via INTRAVENOUS
  Filled 2018-10-14: qty 50

## 2018-10-14 MED ORDER — HALOPERIDOL LACTATE 5 MG/ML IJ SOLN
2.0000 mg | Freq: Once | INTRAMUSCULAR | Status: AC
  Administered 2018-10-14: 2 mg via INTRAVENOUS
  Filled 2018-10-14: qty 1

## 2018-10-14 MED ORDER — PROCHLORPERAZINE EDISYLATE 10 MG/2ML IJ SOLN
10.0000 mg | Freq: Once | INTRAMUSCULAR | Status: AC
  Administered 2018-10-14: 10 mg via INTRAVENOUS
  Filled 2018-10-14: qty 2

## 2018-10-14 MED ORDER — KETOROLAC TROMETHAMINE 15 MG/ML IJ SOLN
15.0000 mg | Freq: Once | INTRAMUSCULAR | Status: AC
  Administered 2018-10-14: 15 mg via INTRAVENOUS
  Filled 2018-10-14: qty 1

## 2018-10-14 MED ORDER — SODIUM CHLORIDE 0.9 % IV BOLUS
1000.0000 mL | Freq: Once | INTRAVENOUS | Status: AC
  Administered 2018-10-14: 1000 mL via INTRAVENOUS

## 2018-10-14 NOTE — ED Notes (Signed)
NEED RECOLLECT ON LAV LT GREEN BLUE

## 2018-10-14 NOTE — ED Provider Notes (Signed)
[facility] DEPT Provider Note   CSN: [id number] Arrival date & time: 10/14/18  0800     History   Chief Complaint Chief Complaint  Patient presents with  . Dizziness  . Abdominal Pain  . Vaginal Bleeding    HPI Nicole [name] is a 42 y.o. female.  HPI   Presents with heavy menstrual bleeding, syncope, headaches, LUQ pain with coughing, burning abdominal pain after eating. Syncopal episodes-has had about 6 episodes in 2 days. Lightheaded ongoing throughout day.  Reports worsening lightheadedness prior to episodes of passing out.  Has hx of passing out before.  Does report a little bit of upper chest pain with it, had work up done last week and given abx for pneumonia, completed that.  Feels like has palpitations as well.  Reports will feel lightheaded with walking then pass out.  No family hx of early death, mom and dad have hx of MI in their 33s, also mom with CHF.  Husband thinks saw seizures in sleep, has hx of same, not sure if headache is from that, also hx of migraines.  Has had one week of menstrual bleeding, was not heavy at first, then Tuesday worsened, now wearing pull ups because of large clots,  Was changing pads every 30 min prior to that, now changing pull ups every 4 hours.  Had fevers last week, yest was 100.2 temporal thermometer. Still coughing, green sputum.  Green nasal congestion. No sore throat.  No nausea or vomiting, decreased appetite. No diarrhea.  No shortness of breath.  No longer on birth control, no leg pain or swelling.  Headaches for four days, 8/10, started slowly and worsened.  Taking tylenol and motrin, excedrin migraine.  Abdominal pain began with menses last week, only burning with eating.  Pain with coughing in lower chest.   Past Medical History:  Diagnosis Date  . Anemia   . Asthma   . Atypical chest pain    a. ?pericarditis 2015.  Marland Kitchen Blood transfusion 2011   r/t anemia  . Fibroid   . Migraine   . Seizures ([place])    . Sickle cell trait ([place])   . Tooth abscess     Patient Active Problem List   Diagnosis Date Noted  . 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 Depression   . Generalized abdominal pain   . Fatigue 10/28/2015  . Adjustment disorder with depressed mood 10/21/2015  . Dizziness   . Weakness 11/18/2014  . OSA (obstructive sleep apnea) 10/21/2014  . Acute rhinosinusitis 10/09/2014  . Leg pain, lateral 09/04/2014  . Migraines 08/13/2014  . Seizure ([place]) 06/04/2014  . Leiomyoma of uterus, unspecified 02/11/2014  . GERD (gastroesophageal reflux disease) 01/28/2014  . Candidiasis of vulva and vagina 01/28/2014  . Unspecified symptom associated with female genital organs 01/28/2014  . Chest pain 01/20/2014  . Acute pericarditis, unspecified 01/20/2014    Past Surgical History:  Procedure Laterality Date  . TUBAL LIGATION Bilateral 2005     OB History    Gravida  3   Para  2   Term  1   Preterm  1   AB  1   Living  2     SAB  0   TAB  0   Ectopic  1   Multiple  0   Live Births  2            Home Medications    Prior to Admission medications   Medication Sig Start Date  End Date Taking? Authorizing Provider  acetaminophen (TYLENOL) 325 MG tablet Take 650 mg by mouth every 6 (six) hours as needed for moderate pain or headache.   Yes [provider]  azithromycin (ZITHROMAX) 250 MG tablet Take 1 tablet (250 mg total) by mouth daily. Take 1 every day until finished. 10/09/18  Yes Mesner, Corene Cornea, MD  cetirizine (ZYRTEC) 10 MG tablet Take 10 mg by mouth daily.   Yes [provider]  EPINEPHrine (EPIPEN 2-PAK) 0.3 mg/0.3 mL IJ SOAJ injection Inject 0.3 mg into the muscle as needed (for allergic reaction).   Yes [provider]  ibuprofen (ADVIL,MOTRIN) 800 MG tablet Take 1 tablet (800 mg total) by mouth 3 (three) times daily. 10/09/18  Yes Mesner, Corene Cornea, MD  levETIRAcetam (KEPPRA) 1000 MG tablet Take 1 tablet (1,000 mg total) by mouth 2 (two) times  daily. 07/07/18  Yes Molpus, John, MD  topiramate (TOPAMAX) 25 MG tablet Take 3 tablets (75 mg total) by mouth 2 (two) times daily. 07/07/18  Yes Molpus, John, MD    Family History Family History  Problem Relation Age of Onset  . Hypertension Mother   . Diabetes Mother   . Cancer Mother        Colon, brain, 2 other  . Asthma Mother   . Clotting disorder Mother   . Heart disease Mother        61, stents  . Stroke Mother        2012  . Diabetes Father   . Asthma Father   . Clotting disorder Father   . Sickle cell anemia Father   . Heart disease Father        2000, stents  . Hypertension Father   . Stroke Father        2007  . Diabetes Maternal Grandmother   . Asthma Sister   . Asthma Brother   . Asthma Paternal Grandmother   . Anesthesia problems Neg Hx     Social History Social History   Tobacco Use  . Smoking status: Former Smoker    Last attempt to quit: 03/27/2014    Years since quitting: 4.5  . Smokeless tobacco: Never Used  Substance Use Topics  . Alcohol use: No  . Drug use: No     Allergies   Penicillins; Shrimp [shellfish allergy]; Tea; and Sulfa antibiotics   Review of Systems Review of Systems  Constitutional: Positive for fatigue. Negative for fever.  HENT: Positive for congestion. Negative for sore throat.   Eyes: Negative for visual disturbance.  Respiratory: Positive for cough. Negative for shortness of breath.   Cardiovascular: Positive for chest pain. Negative for leg swelling.  Gastrointestinal: Positive for abdominal pain. Negative for diarrhea, nausea and vomiting.  Genitourinary: Negative for difficulty urinating.  Musculoskeletal: Negative for back pain and neck pain.  Skin: Negative for rash.  Neurological: Positive for syncope, light-headedness and headaches. Negative for weakness and numbness.     Physical Exam Updated Vital Signs BP 127/86   Pulse 62   Temp 98.4 F (36.9 C) (Oral)   Resp (!) 29   SpO2 100%   Physical  Exam  Constitutional: She is oriented to person, place, and time. She appears well-developed and well-nourished. No distress.  HENT:  Head: Normocephalic and atraumatic.  Eyes: Conjunctivae and EOM are normal.  Neck: Normal range of motion.  Cardiovascular: Normal rate, regular rhythm, normal heart sounds and intact distal pulses. Exam reveals no gallop and no friction rub.  No murmur heard.  Pulmonary/Chest: Effort normal and breath sounds normal. No respiratory distress. She has no wheezes. She has no rales.  Abdominal: Soft. She exhibits no distension. There is no tenderness. There is no guarding.  Musculoskeletal: She exhibits no edema or tenderness.  Neurological: She is alert and oriented to person, place, and time.  Skin: Skin is warm and dry. No rash noted. She is not diaphoretic. No erythema.  Nursing note and vitals reviewed.    ED Treatments / Results  Labs (all labs ordered are listed, but only abnormal results are displayed) Labs Reviewed  CBC WITH DIFFERENTIAL/PLATELET - Abnormal; Notable for the following components:      Result Value   Hemoglobin 11.6 (*)    HCT 35.8 (*)    All other components within normal limits  COMPREHENSIVE METABOLIC PANEL - Abnormal; Notable for the following components:   Calcium 8.6 (*)    All other components within normal limits  D-DIMER, QUANTITATIVE (NOT AT [facility])  CBC WITH DIFFERENTIAL/PLATELET  I-STAT BETA HCG BLOOD, ED ([facility], [facility], [facility] ONLY)  I-STAT TROPONIN, ED    EKG EKG Interpretation  Date/Time:  Monday October 14 2018 09:03:27 EST Ventricular Rate:  70 PR Interval:    QRS Duration: 88 QT Interval:  404 QTC Calculation: 436 R Axis:   50 Text Interpretation:  Sinus rhythm No significant change since last tracing Confirmed by Gareth Morgan [phone]) on 10/14/2018 9:57:08 AM   Radiology Dg Chest 2 View  Result Date: 10/14/2018 CLINICAL DATA:  Cough, fever, and dizziness.  Asthma. EXAM: CHEST - 2 VIEW COMPARISON:   10/09/2018 FINDINGS: The heart size and mediastinal contours are within normal limits. Both lungs are clear. The visualized skeletal structures are unremarkable. IMPRESSION: Negative.  No active cardiopulmonary disease. Electronically Signed   By: Earle Gell M.D.   On: 10/14/2018 08:59    Procedures Procedures (including critical care time)  Medications Ordered in ED Medications  sodium chloride 0.9 % bolus 1,000 mL (0 mLs Intravenous Stopped 10/14/18 1052)  prochlorperazine (COMPAZINE) injection 10 mg (10 mg Intravenous Given 10/14/18 0940)  diphenhydrAMINE (BENADRYL) injection 25 mg (25 mg Intravenous Given 10/14/18 0941)  ketorolac (TORADOL) 15 MG/ML injection 15 mg (15 mg Intravenous Given 10/14/18 1052)  haloperidol lactate (HALDOL) injection 2 mg (2 mg Intravenous Given 10/14/18 1051)  magnesium sulfate IVPB 2 g 50 mL (0 g Intravenous Stopped 10/14/18 1154)     Initial Impression / Assessment and Plan / ED Course  I have reviewed the triage vital signs and the nursing notes.  Pertinent labs & imaging results that were available during my care of the patient were reviewed by me and considered in my medical decision making (see chart for details).     42 year old female with history of fibroids, seizures, who presents with multiple concerns, including menorrhagia, lightheadedness and syncope, continuing cough after diagnosis of pneumonia last week, headache, abdominal pain.   EKG evaluated by me and shows sinus rhythm with no sign of prolonged QTc, no brugada, no delta waves, no sign of HOCM, no ST abnormalities.  Pregnancy test negative, no ectopic. Describes significant menorrhagia over the last week, however hgb checked and 11.6, do not suspect anemia as etiology of syncope.  No significant electrolyte abnormalities.  Abdominal exam benign, have low suspicion for acute intra-abdominal process.  Troponin negative.  Chest x-ray shows no sign of pneumonia, pneumothorax, or pulmonary edema.   She is PERC negative,low risk [place] with a negative ddimer no dyspnea, and doubt PE. No history to  suggest subarachnoid hemorrhage, meningitis, or intracranial bleed.  Headache likely secondary to dehydration and migraine.  No sign of arrythmia on telemetry. No family history of sudden death, ECG changes, or risk factors, and feel outpatient Cardiology follow up and holter monitoring appropriate.  Overall given multiple symptoms, feel lightheadedness likely in setting of URI, dehydration, leading to vasovagal syncope.  Given IV fluids, headache cocktail (compazine benadryl followed by haldol, toradol and Mg) with improvement.   Recommend continued supportive care, hydration, follow up with PCP. OBGYN for menorrhagia and Cardiology for possible holter monitoring.  Final Clinical Impressions(s) / ED Diagnoses   Final diagnoses:  Menorrhagia with regular cycle  Syncope, unspecified syncope type  Dehydration  Generalized abdominal pain  Cough  Acute nonintractable headache, unspecified headache type    ED Discharge Orders    None       Gareth Morgan, MD 10/14/18 2104

## 2018-10-14 NOTE — ED Notes (Signed)
Patient assisted to restroom via wheelchair with the assistance of daughter. Returned to room, placed back in bed. Pain 0/10.

## 2018-10-14 NOTE — ED Triage Notes (Signed)
Pt reports that she started her period last mOnday and having lots of clots and abd pains. Believes that is what is causing her to be dizzy and having headaches.

## 2018-12-16 ENCOUNTER — Emergency Department (HOSPITAL_COMMUNITY)
Admission: EM | Admit: 2018-12-16 | Discharge: 2018-12-16 | Disposition: A | Discharge status: Home / Self Care | Payer: BLUE CROSS/BLUE SHIELD | Attending: Emergency Medicine | Admitting: Emergency Medicine

## 2018-12-16 ENCOUNTER — Emergency Department (HOSPITAL_COMMUNITY): Payer: BLUE CROSS/BLUE SHIELD

## 2018-12-16 ENCOUNTER — Other Ambulatory Visit: Payer: Self-pay

## 2018-12-16 ENCOUNTER — Encounter (HOSPITAL_COMMUNITY): Payer: Self-pay

## 2018-12-16 DIAGNOSIS — Z79899 Other long term (current) drug therapy: Secondary | ICD-10-CM | POA: Diagnosis not present

## 2018-12-16 DIAGNOSIS — Z87891 Personal history of nicotine dependence: Secondary | ICD-10-CM | POA: Diagnosis not present

## 2018-12-16 DIAGNOSIS — M25561 Pain in right knee: Secondary | ICD-10-CM

## 2018-12-16 DIAGNOSIS — J45909 Unspecified asthma, uncomplicated: Secondary | ICD-10-CM | POA: Diagnosis not present

## 2018-12-16 DIAGNOSIS — M25551 Pain in right hip: Secondary | ICD-10-CM

## 2018-12-16 MED ORDER — HYDROCODONE-ACETAMINOPHEN 5-325 MG PO TABS
1.0000 | ORAL_TABLET | Freq: Once | ORAL | Status: AC
  Administered 2018-12-16: 1 via ORAL
  Filled 2018-12-16: qty 1

## 2018-12-16 MED ORDER — PREDNISONE 20 MG PO TABS: 40.0000 mg | ORAL_TABLET | Freq: Every day | ORAL | 0 refills | Status: AC

## 2018-12-16 MED ORDER — METHOCARBAMOL 500 MG PO TABS: 500.0000 mg | ORAL_TABLET | Freq: Two times a day (BID) | ORAL | 0 refills | Status: AC

## 2018-12-16 NOTE — ED Notes (Signed)
Patient transported to X-ray 

## 2018-12-16 NOTE — Discharge Instructions (Addendum)
I have prescribed muscle relaxers for your pain, please do not drink or drive while taking this medications as they can make you drowsy.  I have also prescribed steroids, be advised this medication can cause insomnia, appetite changes.  These follow-up with PCP in 1 week for reevaluation of your symptoms.  You experience any bowel or bladder incontinence, fever, worsening in your symptoms please return to the ED.

## 2018-12-16 NOTE — ED Triage Notes (Addendum)
Patient c/o right hip and right knee pain x 1 week and states progressively worse. Patient stated, "It's causing me not to do activities that I want to do." patieant added that she tripped and fell approx 3-4 days ago.

## 2018-12-16 NOTE — ED Provider Notes (Signed)
[facility] DEPT Provider Note   CSN: [id number] Arrival date & time: 12/16/18  1023     History   Chief Complaint Chief Complaint  Patient presents with  . Knee Pain  . Hip Pain  . Fall    HPI Nicole [name] is a 43 y.o. female.  43 y.o female with a PMH of Sickle cell trait is to the ED with a chief complaint of right knee, right hip pain times few weeks.  Patient reports 3 days ago she felt her right knee gave out seeing her to fall to the ground.  She reports she is unable to weight-bear and this has caused but it daily activity.  She reports the pain feels like a throbbing sensation located on her right knee and right hip but does not radiate to the rest of her leg.  Reports this pain is worst at night along with walking.  Taken some Motrin 800 mg prior to arrival.  She denies any fever, chest pain, shortness of breath, leg swelling or abdominal pain.     Past Medical History:  Diagnosis Date  . Anemia   . Asthma   . Atypical chest pain    a. ?pericarditis 2015.  Marland Kitchen Blood transfusion 2011   r/t anemia  . Fibroid   . Migraine   . Seizures ([place])   . Sickle cell trait ([place])   . Tooth abscess     Patient Active Problem List   Diagnosis Date Noted  . 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 Depression   . Generalized abdominal pain   . Fatigue 10/28/2015  . Adjustment disorder with depressed mood 10/21/2015  . Dizziness   . Weakness 11/18/2014  . OSA (obstructive sleep apnea) 10/21/2014  . Acute rhinosinusitis 10/09/2014  . Leg pain, lateral 09/04/2014  . Migraines 08/13/2014  . Seizure ([place]) 06/04/2014  . Leiomyoma of uterus, unspecified 02/11/2014  . GERD (gastroesophageal reflux disease) 01/28/2014  . Candidiasis of vulva and vagina 01/28/2014  . Unspecified symptom associated with female genital organs 01/28/2014  . Chest pain 01/20/2014  . Acute pericarditis, unspecified 01/20/2014    Past Surgical History:  Procedure Laterality Date  .  TUBAL LIGATION Bilateral 2005     OB History    Gravida  3   Para  2   Term  1   Preterm  1   AB  1   Living  2     SAB  0   TAB  0   Ectopic  1   Multiple  0   Live Births  2            Home Medications    Prior to Admission medications   Medication Sig Start Date End Date Taking? Authorizing Provider  acetaminophen (TYLENOL) 325 MG tablet Take 650 mg by mouth every 6 (six) hours as needed for moderate pain or headache.    [provider]  azithromycin (ZITHROMAX) 250 MG tablet Take 1 tablet (250 mg total) by mouth daily. Take 1 every day until finished. 10/09/18   Mesner, Corene Cornea, MD  cetirizine (ZYRTEC) 10 MG tablet Take 10 mg by mouth daily.    [provider]  EPINEPHrine (EPIPEN 2-PAK) 0.3 mg/0.3 mL IJ SOAJ injection Inject 0.3 mg into the muscle as needed (for allergic reaction).    [provider]  ibuprofen (ADVIL,MOTRIN) 800 MG tablet Take 1 tablet (800 mg total) by mouth 3 (three) times daily. 10/09/18   Mesner, Corene Cornea, MD  levETIRAcetam (KEPPRA) 1000 MG tablet Take 1 tablet (1,000 mg total) by mouth 2 (two) times daily. 07/07/18   Molpus, Jenny Reichmann, MD  methocarbamol (ROBAXIN) 500 MG tablet Take 1 tablet (500 mg total) by mouth 2 (two) times daily for 7 days. 12/16/18 12/23/18  Janeece Fitting, PA-C  predniSONE (DELTASONE) 20 MG tablet Take 2 tablets (40 mg total) by mouth daily for 5 days. 12/16/18 12/21/18  Janeece Fitting, PA-C  topiramate (TOPAMAX) 25 MG tablet Take 3 tablets (75 mg total) by mouth 2 (two) times daily. 07/07/18   Molpus, John, MD    Family History Family History  Problem Relation Age of Onset  . Hypertension Mother   . Diabetes Mother   . Cancer Mother        Colon, brain, 2 other  . Asthma Mother   . Clotting disorder Mother   . Heart disease Mother        48, stents  . Stroke Mother        2012  . Diabetes Father   . Asthma Father   . Clotting disorder Father   . Sickle cell anemia Father   . Heart disease  Father        2000, stents  . Hypertension Father   . Stroke Father        2007  . Diabetes Maternal Grandmother   . Asthma Sister   . Asthma Brother   . Asthma Paternal Grandmother   . Anesthesia problems Neg Hx     Social History Social History   Tobacco Use  . Smoking status: Former Smoker    Last attempt to quit: 03/27/2014    Years since quitting: 4.7  . Smokeless tobacco: Never Used  Substance Use Topics  . Alcohol use: No  . Drug use: No     Allergies   Penicillins; Shrimp [shellfish allergy]; Tea; and Sulfa antibiotics   Review of Systems Review of Systems  Constitutional: Negative for chills and fever.  Musculoskeletal: Positive for arthralgias, gait problem and myalgias. Negative for joint swelling.  Skin: Negative for pallor and wound.     Physical Exam Updated Vital Signs BP 116/83 (BP Location: Left Arm)   Pulse 95   Temp 98.7 F (37.1 C) (Oral)   Resp 18   Ht 5\' 5"  (1.651 m)   Wt 112.5 kg   LMP 11/29/2018   SpO2 98%   BMI 41.27 kg/m   Physical Exam Vitals signs and nursing note reviewed.  Constitutional:      Appearance: Normal appearance. She is normal weight.  HENT:     Head: Normocephalic and atraumatic.     Nose: Congestion present.  Neck:     Musculoskeletal: Normal range of motion and neck supple.  Cardiovascular:     Rate and Rhythm: Normal rate.     Pulses: Normal pulses.     Heart sounds: Normal heart sounds.  Pulmonary:     Effort: Pulmonary effort is normal.     Breath sounds: Normal breath sounds. No wheezing.  Abdominal:     General: Abdomen is flat. Bowel sounds are normal. There is no distension.  Musculoskeletal:        General: Tenderness present.     Right hip: She exhibits decreased strength and tenderness. She exhibits normal range of motion, no swelling, no deformity and no laceration.     Right knee: She exhibits normal range of motion, no swelling, no effusion and no erythema. Tenderness found.     Lumbar  back: She exhibits pain and spasm.       Back:     Right lower leg: No edema.     Left lower leg: No edema.       Legs:     Comments: No erythema, edema, she is afebrile.  Pulses present.  No breakage in the skin or swelling.  Tenderness to palpation along the right hip and right knee. Strength 5/5 with dorsiflexion and plantarflexion.   Neurological:     General: No focal deficit present.     Mental Status: She is alert and oriented to person, place, and time.      ED Treatments / Results  Labs (all labs ordered are listed, but only abnormal results are displayed) Labs Reviewed - No data to display  EKG None  Radiology Dg Knee Complete 4 Views Right  Result Date: 12/16/2018 CLINICAL DATA:  Right knee pain, fall EXAM: RIGHT KNEE - COMPLETE 4+ VIEW COMPARISON:  None. FINDINGS: Early joint space narrowing and spurring in the patellofemoral compartment. No significant joint effusion. No acute bony abnormality. Specifically, no fracture, subluxation, or dislocation. 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 No acute bony abnormality. Electronically Signed   By: Rolm Baptise M.D.   On: 12/16/2018 12:00   Dg Hip Unilat  With Pelvis 2-3 Views Right  Result Date: 12/16/2018 CLINICAL DATA:  Right hip pain status post fall EXAM: DG HIP (WITH OR WITHOUT PELVIS) 2-3V RIGHT COMPARISON:  None. FINDINGS: There is no evidence of hip fracture or dislocation. There is no evidence of arthropathy or other focal bone abnormality. IMPRESSION: Negative. Electronically Signed   By: Kathreen Devoid   On: 12/16/2018 12:01    Procedures Procedures (including critical care time)  Medications Ordered in ED Medications  HYDROcodone-acetaminophen (NORCO/VICODIN) 5-325 MG per tablet 1 tablet (1 tablet Oral Given 12/16/18 1222)     Initial Impression / Assessment and Plan / ED Course  I have reviewed the triage vital signs and the nursing notes.  Pertinent labs & imaging results that  were available during my care of the patient were reviewed by me and considered in my medical decision making (see chart for details).    Patient presents with right hip, right knee pain which began a few weeks ago. She reports no relieve with OTC medication and having her pain limit her daily activities. During evaluation patient reports pain with palpation along the lower back and right hip area also along the right knee, posterior and anterior region. 5/5 strength. She is afebrile, low suspicion for septic joint. No erythema or edema to joint region.   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 No acute  bony abnormality.   At this time will write patient with pain medication.  I will give patient a referral for orthopedist follow-up.She will also be sent home on muscle relaxers along with steroid burst, some suspicion for sciatica. No further emergent workup at this time. Patient understand and agrees with plan. Return precautions provided.   Final Clinical Impressions(s) / ED Diagnoses   Final diagnoses:  Acute pain of right knee  Right hip pain    ED Discharge Orders         Ordered    methocarbamol (ROBAXIN) 500 MG tablet  2 times daily     12/16/18 1217    predniSONE (DELTASONE) 20 MG tablet  Daily     12/16/18 1217           [place], [place], PA-C  12/16/18 [place], [place], DO 12/16/18 1327

## 2018-12-23 IMAGING — CR DG CHEST 2V
2 series · 2 of 2 positions shown · non-contrast
Comparison: None.

CLINICAL DATA: Acute onset of generalized fatigue and left-sided
chest pain. Upper back and arm pain.

EXAM:
CHEST - 2 VIEW

[chest pa]
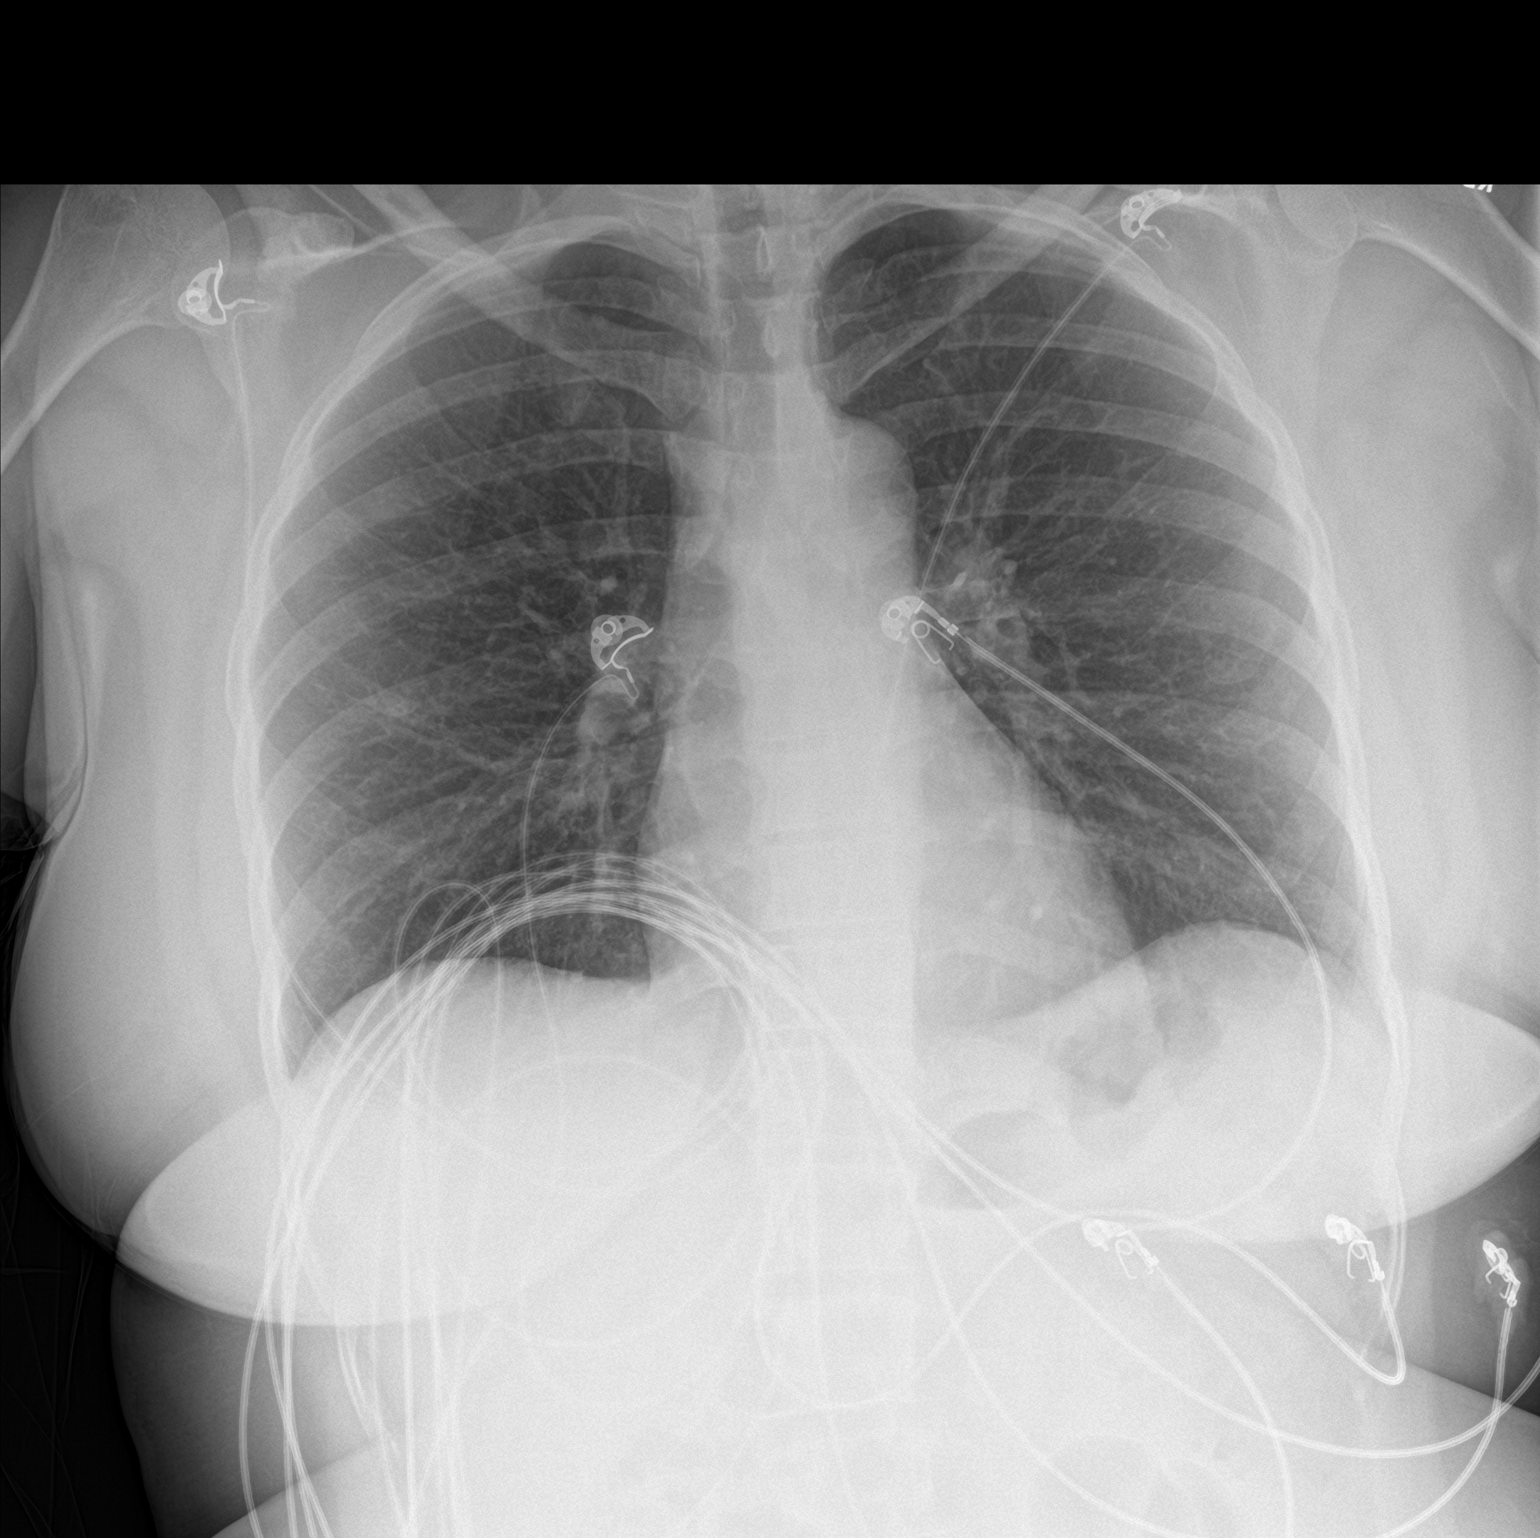

[chest lat]
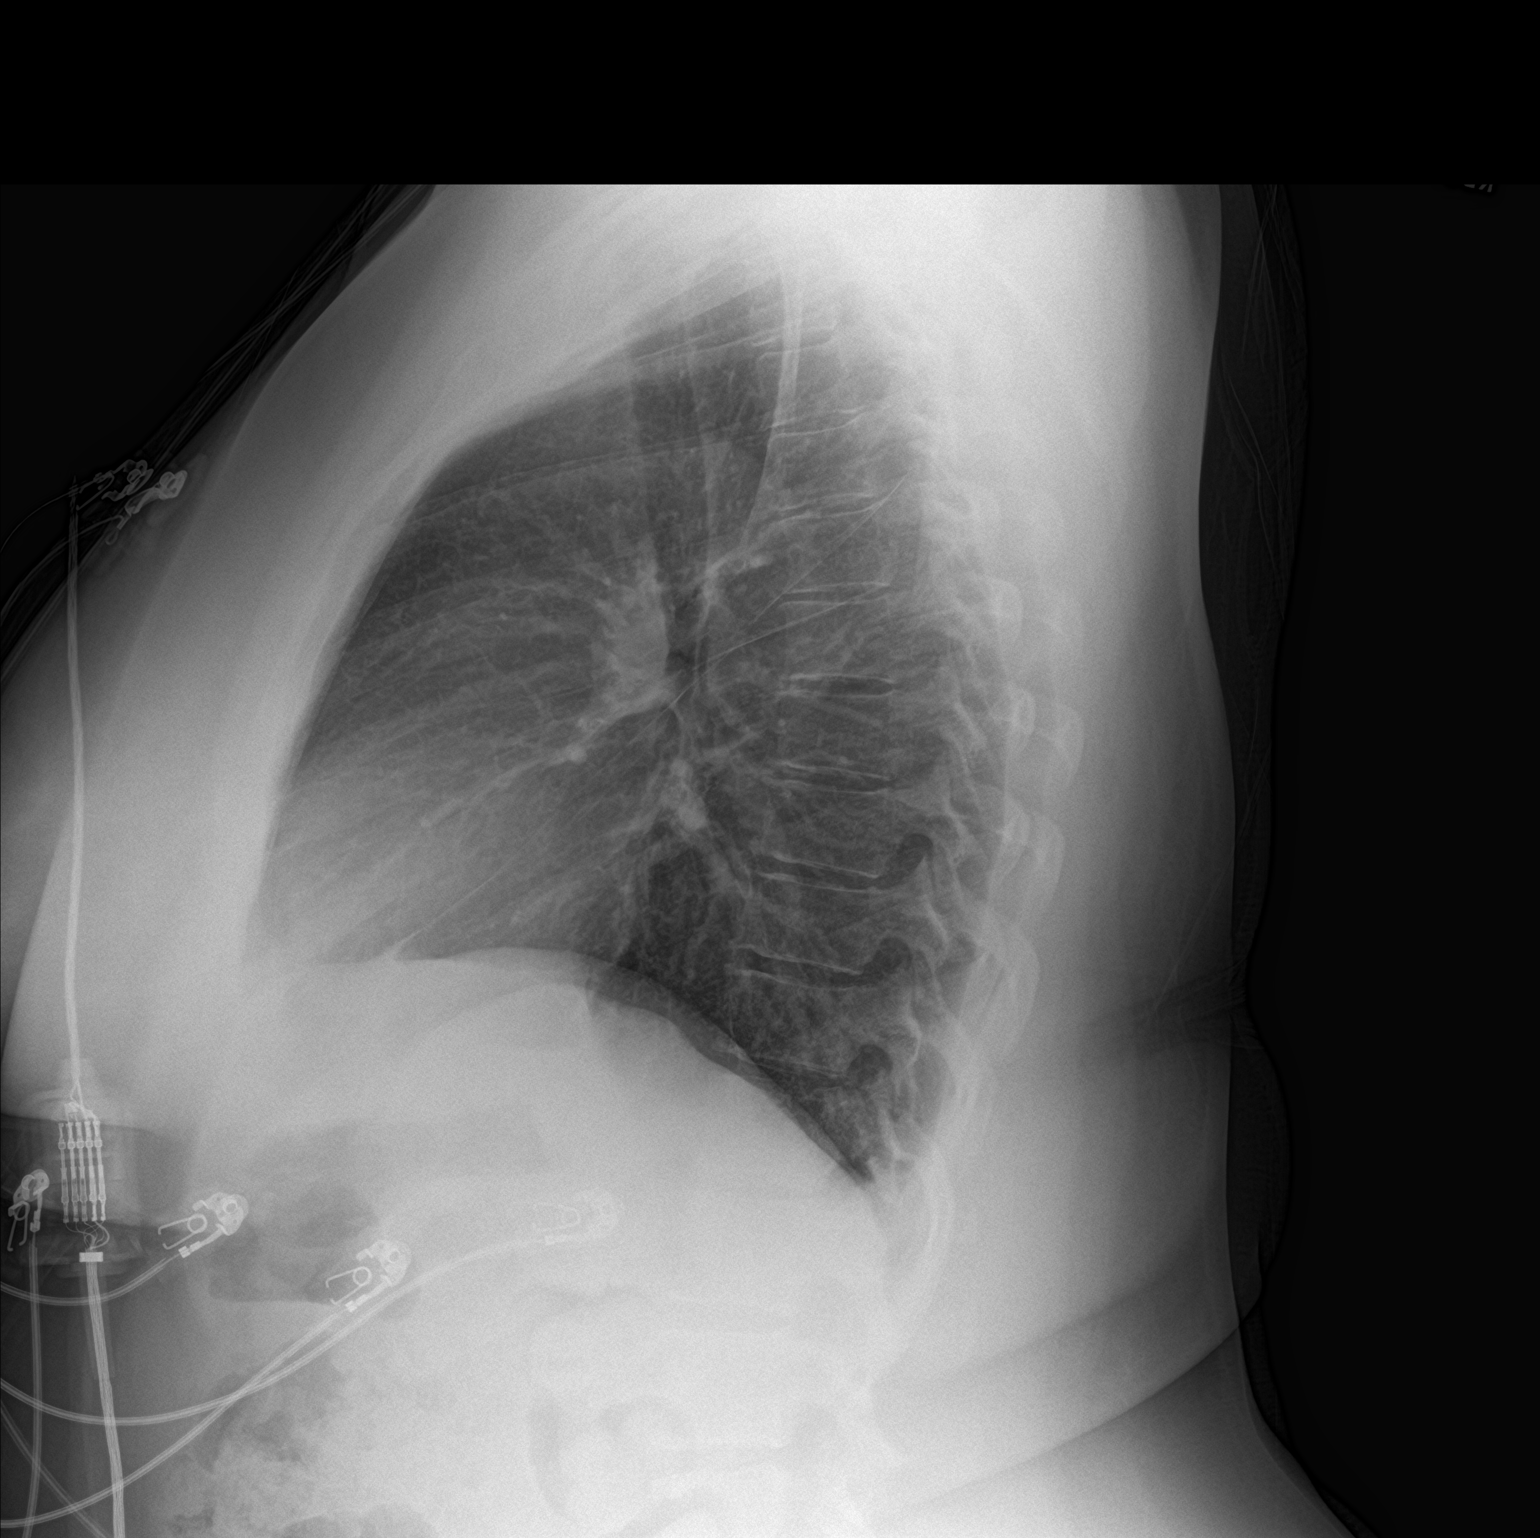

[2 of 2 positions shown; findings below may reference images not displayed]

FINDINGS: The lungs are well-aerated and clear. There is no evidence of focal
opacification, pleural effusion or pneumothorax.

The heart is normal in size; the mediastinal contour is within
normal limits. No acute osseous abnormalities are seen.
IMPRESSION: No acute cardiopulmonary process seen.

## 2018-12-29 ENCOUNTER — Encounter (HOSPITAL_COMMUNITY): Payer: Self-pay

## 2018-12-29 ENCOUNTER — Emergency Department (HOSPITAL_COMMUNITY)
Admission: EM | Admit: 2018-12-29 | Discharge: 2018-12-29 | Disposition: A | Discharge status: Home / Self Care | Payer: BLUE CROSS/BLUE SHIELD | Attending: Emergency Medicine | Admitting: Emergency Medicine

## 2018-12-29 ENCOUNTER — Other Ambulatory Visit: Payer: Self-pay

## 2018-12-29 DIAGNOSIS — J45909 Unspecified asthma, uncomplicated: Secondary | ICD-10-CM | POA: Insufficient documentation

## 2018-12-29 DIAGNOSIS — D649 Anemia, unspecified: Secondary | ICD-10-CM | POA: Insufficient documentation

## 2018-12-29 DIAGNOSIS — Z87891 Personal history of nicotine dependence: Secondary | ICD-10-CM | POA: Insufficient documentation

## 2018-12-29 DIAGNOSIS — N939 Abnormal uterine and vaginal bleeding, unspecified: Secondary | ICD-10-CM | POA: Insufficient documentation

## 2018-12-29 DIAGNOSIS — Z79899 Other long term (current) drug therapy: Secondary | ICD-10-CM | POA: Insufficient documentation

## 2018-12-29 LAB — CBC WITH DIFFERENTIAL/PLATELET
Abs Immature Granulocytes: 0.02 10*3/uL (ref 0.00–0.07)
Basophils Absolute: 0.1 10*3/uL (ref 0.0–0.1)
Basophils Relative: 1 %
Eosinophils Absolute: 0.4 10*3/uL (ref 0.0–0.5)
Eosinophils Relative: 5 %
HCT: 34 % — ABNORMAL LOW (ref 36.0–46.0)
Hemoglobin: 10.9 g/dL — ABNORMAL LOW (ref 12.0–15.0)
IMMATURE GRANULOCYTES: 0 %
Lymphocytes Relative: 43 %
Lymphs Abs: 2.9 10*3/uL (ref 0.7–4.0)
MCH: 29.1 pg (ref 26.0–34.0)
MCHC: 32.1 g/dL (ref 30.0–36.0)
MCV: 90.7 fL (ref 80.0–100.0)
MONOS PCT: 6 %
Monocytes Absolute: 0.4 10*3/uL (ref 0.1–1.0)
NEUTROS PCT: 45 %
Neutro Abs: 2.9 10*3/uL (ref 1.7–7.7)
Platelets: 302 10*3/uL (ref 150–400)
RBC: 3.75 MIL/uL — ABNORMAL LOW (ref 3.87–5.11)
RDW: 12.9 % (ref 11.5–15.5)
WBC: 6.7 10*3/uL (ref 4.0–10.5)
nRBC: 0 % (ref 0.0–0.2)

## 2018-12-29 LAB — URINALYSIS, ROUTINE W REFLEX MICROSCOPIC
Bilirubin Urine: NEGATIVE
Glucose, UA: NEGATIVE mg/dL
Ketones, ur: NEGATIVE mg/dL
Leukocytes, UA: NEGATIVE
Nitrite: NEGATIVE
Protein, ur: NEGATIVE mg/dL
Specific Gravity, Urine: 1.003 — ABNORMAL LOW (ref 1.005–1.030)
pH: 8 (ref 5.0–8.0)

## 2018-12-29 LAB — BASIC METABOLIC PANEL
Anion gap: 5 (ref 5–15)
BUN: 9 mg/dL (ref 6–20)
CO2: 24 mmol/L (ref 22–32)
Calcium: 8.9 mg/dL (ref 8.9–10.3)
Chloride: 112 mmol/L — ABNORMAL HIGH (ref 98–111)
Creatinine, Ser: 0.86 mg/dL (ref 0.44–1.00)
GFR calc Af Amer: >60 mL/min (ref >60)
GFR calc non Af Amer: >60 mL/min (ref >60)
GLUCOSE: 78 mg/dL (ref 70–99)
Potassium: 3.6 mmol/L (ref 3.5–5.1)
Sodium: 141 mmol/L (ref 135–145)

## 2018-12-29 LAB — WET PREP, GENITAL
SPERM: NONE SEEN
Trich, Wet Prep: NONE SEEN
Yeast Wet Prep HPF POC: NONE SEEN

## 2018-12-29 LAB — I-STAT BETA HCG BLOOD, ED (MC, WL, AP ONLY): I-stat hCG, quantitative: <5 m[IU]/mL (ref <5)

## 2018-12-29 MED ORDER — SODIUM CHLORIDE 0.9 % IV BOLUS
1000.0000 mL | Freq: Once | INTRAVENOUS | Status: AC
  Administered 2018-12-29: 1000 mL via INTRAVENOUS

## 2018-12-29 MED ORDER — FERROUS SULFATE 325 (65 FE) MG PO TABS: 325.0000 mg | ORAL_TABLET | Freq: Every day | ORAL | 0 refills | Status: DC

## 2018-12-29 MED ORDER — KETOROLAC TROMETHAMINE 15 MG/ML IJ SOLN
15.0000 mg | Freq: Once | INTRAMUSCULAR | Status: AC
  Administered 2018-12-29: 15 mg via INTRAVENOUS
  Filled 2018-12-29: qty 1

## 2018-12-29 MED ORDER — NAPROXEN 500 MG PO TABS: 500.0000 mg | ORAL_TABLET | Freq: Two times a day (BID) | ORAL | 0 refills | Status: DC

## 2018-12-29 NOTE — ED Notes (Signed)
Patient given discharge teaching and verbalized understanding. Patient ambulated out of ED with a steady gait. 

## 2018-12-29 NOTE — Discharge Instructions (Signed)
You were seen in the ER today for vaginal bleeding.  Your work-up was overall reassuring

## 2018-12-29 NOTE — ED Triage Notes (Signed)
Pt here with increased vaginal bleeding.  Started bleeding 3 days ago.  Extremely heavy with abdominal pain.  Had tubes tied in 2005.

## 2018-12-29 NOTE — ED Notes (Signed)
Upon patient request, house phlebotomy was contacted to retrieve blood samples.

## 2018-12-29 NOTE — ED Provider Notes (Signed)
[facility] DEPT Provider Note   CSN: [id number] Arrival date & time: 12/29/18  1342     History   Chief Complaint Chief Complaint  Patient presents with  . Vaginal Bleeding    HPI Nicole [name] is a 43 y.o. female with a hx of anemia requiring transfusions, asthma, migraines, seizures, sickle cell trait, and uterine fibroids who presents to the ED with complaints of vaginal bleeding x 3 days. Patient states that her period started 3 days prior and she has had vaginal bleeding that his heavier than her typical periods. She states she is wearing pull-ups/diapers and is changing these 5 x per day, no pad/tampon use with this. She state with the bleeding she has had her typical period cramps in the pelvic area, current discomfort is an 8/10 in severity without specific alleviating/aggravating factors. She states her LMP prior to onset of this period was around the same time in December. She is here today because the flow has been as heavy as it has. Denies fever, chills, nausea, vomiting, diarrhea, dysuria, vaginal discharge, or concern for STD. She is sexually active in a monogamous relationship w/ 1 partner. She states she has had similar heavy bleeding in the past which was attributed to fibroids. She denies lightheadedness, dizziness, syncope, chest pain, or dyspnea.   HPI  Past Medical History:  Diagnosis Date  . Anemia   . Asthma   . Atypical chest pain    a. ?pericarditis 2015.  Marland Kitchen Blood transfusion 2011   r/t anemia  . Fibroid   . Migraine   . Seizures ([place])   . Sickle cell trait ([place])   . Tooth abscess     Patient Active Problem List   Diagnosis Date Noted  . 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 Depression   . Generalized abdominal pain   . Fatigue 10/28/2015  . Adjustment disorder with depressed mood 10/21/2015  . Dizziness   . Weakness 11/18/2014  . OSA (obstructive sleep apnea) 10/21/2014  . Acute rhinosinusitis 10/09/2014  . Leg pain, lateral  09/04/2014  . Migraines 08/13/2014  . Seizure ([place]) 06/04/2014  . Leiomyoma of uterus, unspecified 02/11/2014  . GERD (gastroesophageal reflux disease) 01/28/2014  . Candidiasis of vulva and vagina 01/28/2014  . Unspecified symptom associated with female genital organs 01/28/2014  . Chest pain 01/20/2014  . Acute pericarditis, unspecified 01/20/2014    Past Surgical History:  Procedure Laterality Date  . TUBAL LIGATION Bilateral 2005     OB History    Gravida  3   Para  2   Term  1   Preterm  1   AB  1   Living  2     SAB  0   TAB  0   Ectopic  1   Multiple  0   Live Births  2            Home Medications    Prior to Admission medications   Medication Sig Start Date End Date Taking? Authorizing Provider  acetaminophen (TYLENOL) 325 MG tablet Take 650 mg by mouth every 6 (six) hours as needed for moderate pain or headache.   Yes [provider]  cetirizine (ZYRTEC) 10 MG tablet Take 10 mg by mouth daily.   Yes [provider]  EPINEPHrine (EPIPEN 2-PAK) 0.3 mg/0.3 mL IJ SOAJ injection Inject 0.3 mg into the muscle as needed (for allergic reaction).   Yes [provider]  levETIRAcetam (KEPPRA) 1000 MG tablet Take 1  tablet (1,000 mg total) by mouth 2 (two) times daily. 07/07/18  Yes Molpus, John, MD  topiramate (TOPAMAX) 25 MG tablet Take 3 tablets (75 mg total) by mouth 2 (two) times daily. 07/07/18  Yes Molpus, John, MD  azithromycin (ZITHROMAX) 250 MG tablet Take 1 tablet (250 mg total) by mouth daily. Take 1 every day until finished. Patient not taking: Reported on 12/29/2018 10/09/18   Mesner, Corene Cornea, MD  ibuprofen (ADVIL,MOTRIN) 800 MG tablet Take 1 tablet (800 mg total) by mouth 3 (three) times daily. Patient not taking: Reported on 12/29/2018 10/09/18   Mesner, Corene Cornea, MD    Family History Family History  Problem Relation Age of Onset  . Hypertension Mother   . Diabetes Mother   . Cancer Mother        Colon, brain, 2 other    . Asthma Mother   . Clotting disorder Mother   . Heart disease Mother        78, stents  . Stroke Mother        2012  . Diabetes Father   . Asthma Father   . Clotting disorder Father   . Sickle cell anemia Father   . Heart disease Father        2000, stents  . Hypertension Father   . Stroke Father        2007  . Diabetes Maternal Grandmother   . Asthma Sister   . Asthma Brother   . Asthma Paternal Grandmother   . Anesthesia problems Neg Hx     Social History Social History   Tobacco Use  . Smoking status: Former Smoker    Last attempt to quit: 03/27/2014    Years since quitting: 4.7  . Smokeless tobacco: Never Used  Substance Use Topics  . Alcohol use: No  . Drug use: No     Allergies   Penicillins; Shrimp [shellfish allergy]; Tea; and Sulfa antibiotics   Review of Systems Review of Systems  Constitutional: Negative for chills and fever.  Respiratory: Negative for shortness of breath.   Cardiovascular: Negative for chest pain.  Gastrointestinal: Negative for blood in stool, constipation, diarrhea, nausea and vomiting.  Genitourinary: Positive for pelvic pain and vaginal bleeding. Negative for dysuria, frequency and vaginal discharge.  Neurological: Negative for dizziness, syncope, weakness, light-headedness and numbness.  All other systems reviewed and are negative.    Physical Exam Updated Vital Signs BP 102/68   Pulse 84   Temp 97.9 F (36.6 C) (Oral)   Resp 17   Ht 5\' 5"  (1.651 m)   Wt 108.9 kg   LMP 11/29/2018   SpO2 97%   BMI 39.94 kg/m   Physical Exam Vitals signs and nursing note reviewed. Exam conducted with a chaperone present.  Constitutional:      General: She is not in acute distress.    Appearance: She is well-developed. She is not toxic-appearing.  HENT:     Head: Normocephalic and atraumatic.  Eyes:     General:        Right eye: No discharge.        Left eye: No discharge.     Conjunctiva/sclera: Conjunctivae normal.      Comments: No conjunctival pallor.   Neck:     Musculoskeletal: Neck supple.  Cardiovascular:     Rate and Rhythm: Normal rate and regular rhythm.  Pulmonary:     Effort: Pulmonary effort is normal. No respiratory distress.     Breath sounds: Normal breath sounds. No  wheezing, rhonchi or rales.  Abdominal:     General: There is no distension.     Palpations: Abdomen is soft.     Tenderness: There is abdominal tenderness in the suprapubic area. There is no guarding or rebound.  Genitourinary:    Exam position: Supine.     Labia:        Right: No lesion.        Left: No lesion.      Vagina: Bleeding present.     Cervix: No discharge, friability or lesion.     Adnexa:        Right: No mass.         Left: No mass.       Comments: Patient diffusely uncomfortable throughout pelvic. No focal tenderness.  Skin:    General: Skin is warm and dry.     Findings: No rash.  Neurological:     Mental Status: She is alert.     Comments: Clear speech.   Psychiatric:        Behavior: Behavior normal.      ED Treatments / Results  Labs (all labs ordered are listed, but only abnormal results are displayed) Labs Reviewed  BASIC METABOLIC PANEL  CBC WITH DIFFERENTIAL/PLATELET  I-STAT BETA HCG BLOOD, ED ([facility], [facility], [facility] ONLY)    EKG None  Radiology No results found.  Procedures Procedures (including critical care time)  Medications Ordered in ED Medications - No data to display   Initial Impression / Assessment and Plan / ED Course  I have reviewed the triage vital signs and the nursing notes.  Pertinent labs & imaging results that were available during my care of the patient were reviewed by me and considered in my medical decision making (see chart for details).   Patient presents to the ED with heavy vaginal bleeding at time of menses w/ what she states are her usual period cramps. Nontoxic appearing, in no apparent distress, initial vitals WNL. Exam with some mild suprapubic  tenderness & diffuse discomfort w/ bimanual as well as vaginal bleeding noted in the vaginal canal. No discharge or exam findings to raise concern for PID. No peritoneal signs on abdominal exam, do not suspect ovarian torsion, diverticulitis, appendicitis, bowel obstruction/perforation, pancreatitis, or diverticulitis. Evaluation with basic labs. Fluids & toradol.   Work-up reviewed:  CBC: Mild decreased in hgb/hct consistent with anemia. 09/18/33.0 today, prior on record 11.6/35.8. No leukocytosis BMP: No significant electrolyte disturbance, mild hyperchloremia at 112, renal function preserved.  UA: Consistent with vaginal bleeding, no UTI Wet prep: BV findings, no vaginal discharge or odor- discussed with patient, do not feel tx is necessary & she is in agreement.  I-stat beta hCG: negative. Doubt ectopic.   Patient feeling improved following toradol & fluids. Appears hemodynamically stable with normal vital signs. No symptomatic anemia. Likely menses. Will start iron supplement and provide naproxen for pain with women's follow up. I discussed results, treatment plan, need for follow-up, and return precautions with the patient. Provided opportunity for questions, patient confirmed understanding and is in agreement with plan.   Vitals:   12/29/18 1600 12/29/18 1738  BP: 114/80 105/72  Pulse: 72 74  Resp:  14  Temp:    SpO2: 100% 100%    Final Clinical Impressions(s) / ED Diagnoses   Final diagnoses:  Vaginal bleeding  Anemia, unspecified type    ED Discharge Orders         Ordered    naproxen (NAPROSYN) 500 MG tablet  2 times daily     12/29/18 1747    ferrous sulfate 325 (65 FE) MG tablet  Daily     12/29/18 1747           Amaryllis Dyke, PA-C 12/29/18 1753    Margette Fast, MD 12/29/18 2334

## 2018-12-30 LAB — GC/CHLAMYDIA PROBE AMP (~~LOC~~) NOT AT ARMC
Chlamydia: NEGATIVE
Neisseria Gonorrhea: NEGATIVE

## 2019-01-02 ENCOUNTER — Telehealth: Payer: Self-pay

## 2019-01-02 MED ORDER — METRONIDAZOLE 500 MG PO TABS: 500.0000 mg | ORAL_TABLET | Freq: Two times a day (BID) | ORAL | 0 refills | Status: DC

## 2019-01-02 NOTE — Telephone Encounter (Signed)
Pt stated that she was previously at the hospital and was told that she had BV but no symptoms. Pt is now complaining of vaginal itching/irritation and would like rx. Sent per protocol.

## 2019-01-08 ENCOUNTER — Ambulatory Visit (INDEPENDENT_AMBULATORY_CARE_PROVIDER_SITE_OTHER): Payer: BLUE CROSS/BLUE SHIELD | Admitting: Obstetrics

## 2019-01-08 ENCOUNTER — Encounter: Payer: Self-pay | Admitting: Obstetrics

## 2019-01-08 VITALS — BP 114/80 | HR 87 | Wt 256.0 lb

## 2019-01-08 DIAGNOSIS — Z0000 Encounter for general adult medical examination without abnormal findings: Secondary | ICD-10-CM

## 2019-01-08 DIAGNOSIS — R635 Abnormal weight gain: Secondary | ICD-10-CM | POA: Diagnosis not present

## 2019-01-08 DIAGNOSIS — Z3042 Encounter for surveillance of injectable contraceptive: Secondary | ICD-10-CM | POA: Diagnosis not present

## 2019-01-08 DIAGNOSIS — N898 Other specified noninflammatory disorders of vagina: Secondary | ICD-10-CM | POA: Diagnosis not present

## 2019-01-08 DIAGNOSIS — Z6841 Body Mass Index (BMI) 40.0 and over, adult: Secondary | ICD-10-CM

## 2019-01-08 DIAGNOSIS — N939 Abnormal uterine and vaginal bleeding, unspecified: Secondary | ICD-10-CM

## 2019-01-08 DIAGNOSIS — N946 Dysmenorrhea, unspecified: Secondary | ICD-10-CM | POA: Diagnosis not present

## 2019-01-08 MED ORDER — PRENATAL PLUS 27-1 MG PO TABS: 1.0000 | ORAL_TABLET | Freq: Every day | ORAL | 11 refills | Status: DC

## 2019-01-08 MED ORDER — MEDROXYPROGESTERONE ACETATE 150 MG/ML IM SUSP: 150.0000 mg | INTRAMUSCULAR | 4 refills | Status: DC

## 2019-01-08 MED ORDER — NAPROXEN 500 MG PO TBEC: 500.0000 mg | DELAYED_RELEASE_TABLET | Freq: Two times a day (BID) | ORAL | 6 refills | Status: DC

## 2019-01-08 MED ORDER — MEDROXYPROGESTERONE ACETATE 150 MG/ML IM SUSP
150.0000 mg | Freq: Once | INTRAMUSCULAR | Status: AC
  Administered 2019-01-08: 150 mg via INTRAMUSCULAR

## 2019-01-08 MED ORDER — FERRALET 90 90-1 MG PO TABS: 1.0000 | ORAL_TABLET | Freq: Every day | ORAL | 11 refills | Status: DC

## 2019-01-08 NOTE — Progress Notes (Signed)
Patient ID: Nicole [name], female   DOB: 11/06/1976, 43 y.o.   MRN: [id number]  Chief Complaint  Patient presents with  . Routine Prenatal Visit    HPI Nicole [name] is a 43 y.o. female.  Heavy 8 day period with clots this past month.  Periods normal prior to that episode.  Evaluated in the ER at [facility] a week ago and her work-up was only significant for mild anemia and BV.  Last pelvic ultrasound was in 2016, and showed only a small 1.5 cm anterior intramural fibroid.  She has been on Depo Provera injections in the past for AUB, but she discontinued due t undesired weight gain.  She was amenorrheic on Depo.  She has had a tubal ligation for contraception. HPI  Past Medical History:  Diagnosis Date  . Anemia   . Asthma   . Atypical chest pain    a. ?pericarditis 2015.  Marland Kitchen Blood transfusion 2011   r/t anemia  . Fibroid   . Migraine   . Seizures ([place])   . Sickle cell trait ([place])   . Tooth abscess     Past Surgical History:  Procedure Laterality Date  . TUBAL LIGATION Bilateral 2005    Family History  Problem Relation Age of Onset  . Hypertension Mother   . Diabetes Mother   . Cancer Mother        Colon, brain, 2 other  . Asthma Mother   . Clotting disorder Mother   . Heart disease Mother        73, stents  . Stroke Mother        2012  . Diabetes Father   . Asthma Father   . Clotting disorder Father   . Sickle cell anemia Father   . Heart disease Father        2000, stents  . Hypertension Father   . Stroke Father        2007  . Diabetes Maternal Grandmother   . Asthma Sister   . Asthma Brother   . Asthma Paternal Grandmother   . Anesthesia problems Neg Hx     Social History Social History   Tobacco Use  . Smoking status: Former Smoker    Last attempt to quit: 03/27/2014    Years since quitting: 4.7  . Smokeless tobacco: Never Used  Substance Use Topics  . Alcohol use: No  . Drug use: No    Allergies  Allergen Reactions  . Penicillins Anaphylaxis  and Swelling    Has patient had a PCN reaction causing immediate rash, facial/tongue/throat swelling, SOB or lightheadedness with hypotension: Yes Has patient had a PCN reaction causing severe rash involving mucus membranes or skin necrosis: Yes Has patient had a PCN reaction that required hospitalization Yes- ed visit Has patient had a PCN reaction occurring within the last 10 years: No-childhood allergy If all of the above answers are "NO", then may proceed with Cephalosporin use.   . Shrimp [Shellfish Allergy] Anaphylaxis  . Sulfa Antibiotics Anaphylaxis, Swelling and Rash  . Tea Anaphylaxis    All teas    Current Outpatient Medications  Medication Sig Dispense Refill  . acetaminophen (TYLENOL) 325 MG tablet Take 650 mg by mouth every 6 (six) hours as needed for moderate pain or headache.    . cetirizine (ZYRTEC) 10 MG tablet Take 10 mg by mouth daily.    Marland Kitchen EPINEPHrine (EPIPEN 2-PAK) 0.3 mg/0.3 mL IJ SOAJ injection Inject 0.3 mg into the muscle as needed (for  allergic reaction).    . ferrous sulfate 325 (65 FE) MG tablet Take 1 tablet (325 mg total) by mouth daily. 30 tablet 0  . levETIRAcetam (KEPPRA) 1000 MG tablet Take 1 tablet (1,000 mg total) by mouth 2 (two) times daily. 60 tablet 0  . metroNIDAZOLE (FLAGYL) 500 MG tablet Take 1 tablet (500 mg total) by mouth 2 (two) times daily. 14 tablet 0  . naproxen (NAPROSYN) 500 MG tablet Take 1 tablet (500 mg total) by mouth 2 (two) times daily. 10 tablet 0  . topiramate (TOPAMAX) 25 MG tablet Take 3 tablets (75 mg total) by mouth 2 (two) times daily. 180 tablet 0  . Fe Cbn-Fe Gluc-FA-B12-C-DSS (FERRALET 90) 90-1 MG TABS Take 1 tablet by mouth daily before breakfast. 30 each 11  . medroxyPROGESTERone (DEPO-PROVERA) 150 MG/ML injection Inject 1 mL (150 mg total) into the muscle every 3 (three) months. 1 mL 4  . naproxen (EC-NAPROSYN) 500 MG EC tablet Take 1 tablet (500 mg total) by mouth 2 (two) times daily with a meal. 30 tablet 6  .  prenatal vitamin w/FE, FA (PRENATAL 1 + 1) 27-1 MG TABS tablet Take 1 tablet by mouth daily before breakfast. 30 each 11   No current facility-administered medications for this visit.     Review of Systems Review of Systems Constitutional: negative for fatigue and weight loss Respiratory: negative for cough and wheezing Cardiovascular: negative for chest pain, fatigue and palpitations Gastrointestinal: negative for abdominal pain and change in bowel habits Genitourinary:positive for heavy and prolonged period Integument/breast: negative for nipple discharge Musculoskeletal:negative for myalgias Neurological: negative for gait problems and tremors Behavioral/Psych: negative for abusive relationship, depression Endocrine: negative for temperature intolerance      Blood pressure 114/80, pulse 87, weight 256 lb (116.1 kg), last menstrual period 12/23/2018.  Physical Exam Physical Exam :  Deferred         50% of 15 min visit spent on counseling and coordination of care.   Data Reviewed Labs  Assessment and Plan:  1. Abnormal uterine bleeding (AUB) Rx: - US PELVIC COMPLETE WITH TRANSVAGINAL; Future - Fe Cbn-Fe Gluc-FA-B12-C-DSS (FERRALET 90) 90-1 MG TABS; Take 1 tablet by mouth daily before breakfast.  Dispense: 30 each; Refill: 11 - Ferritin - medroxyPROGESTERone (DEPO-PROVERA) 150 MG/ML injection; Inject 1 mL (150 mg total) into the muscle every 3 (three) months.  Dispense: 1 mL; Refill: 4 - medroxyPROGESTERone (DEPO-PROVERA) injection 150 mg  2. Vaginal discharge - taking Flagyl for BV  3. Excessive body weight gain Rx: - TSH  4. Routine adult health maintenance Rx: - prenatal vitamin w/FE, FA (PRENATAL 1 + 1) 27-1 MG TABS tablet; Take 1 tablet by mouth daily before breakfast.  Dispense: 30 each; Refill: 11  5. Dysmenorrhea Rx: - naproxen (EC-NAPROSYN) 500 MG EC tablet; Take 1 tablet (500 mg total) by mouth 2 (two) times daily with a meal.  Dispense: 30 tablet; Refill:  6    Plan    Follow up in 3 weeks for discussion of results, and Annual  Orders Placed This Encounter  Procedures  . US PELVIC COMPLETE WITH TRANSVAGINAL    BCBS 256 lbs No needs Epic/appt made w/OFC Mr/w Junious Silk to adv pt 32 oz 1 prior and no voiding &301    Standing Status:   Future    Standing Expiration Date:   03/08/2020    Order Specific Question:   Reason for Exam (SYMPTOM  OR DIAGNOSIS REQUIRED)    Answer:   AUB  Order Specific Question:   Preferred imaging location?    Answer:   [facility]  . TSH  . Ferritin   Meds ordered this encounter  Medications  . Fe Cbn-Fe Gluc-FA-B12-C-DSS (FERRALET 90) 90-1 MG TABS    Sig: Take 1 tablet by mouth daily before breakfast.    Dispense:  30 each    Refill:  11  . prenatal vitamin w/FE, FA (PRENATAL 1 + 1) 27-1 MG TABS tablet    Sig: Take 1 tablet by mouth daily before breakfast.    Dispense:  30 each    Refill:  11  . medroxyPROGESTERone (DEPO-PROVERA) 150 MG/ML injection    Sig: Inject 1 mL (150 mg total) into the muscle every 3 (three) months.    Dispense:  1 mL    Refill:  4  . naproxen (EC-NAPROSYN) 500 MG EC tablet    Sig: Take 1 tablet (500 mg total) by mouth 2 (two) times daily with a meal.    Dispense:  30 tablet    Refill:  6  . medroxyPROGESTERone (DEPO-PROVERA) injection 150 mg     Shelly Bombard MD 01-08-2019

## 2019-01-09 LAB — FERRITIN: Ferritin: 11 ng/mL — ABNORMAL LOW (ref 15–150)

## 2019-01-09 LAB — TSH: TSH: 0.956 u[IU]/mL (ref 0.450–4.500)

## 2019-01-10 ENCOUNTER — Ambulatory Visit: Payer: BLUE CROSS/BLUE SHIELD | Admitting: Family Medicine

## 2019-01-13 ENCOUNTER — Other Ambulatory Visit: Payer: Self-pay | Admitting: Obstetrics

## 2019-01-15 ENCOUNTER — Ambulatory Visit
Admission: RE | Admit: 2019-01-15 | Discharge: 2019-01-15 | Disposition: A | Discharge status: Home / Self Care | Payer: BLUE CROSS/BLUE SHIELD | Source: Ambulatory Visit | Attending: Obstetrics | Admitting: Obstetrics

## 2019-01-15 DIAGNOSIS — N939 Abnormal uterine and vaginal bleeding, unspecified: Secondary | ICD-10-CM

## 2019-01-22 ENCOUNTER — Ambulatory Visit (INDEPENDENT_AMBULATORY_CARE_PROVIDER_SITE_OTHER): Payer: BLUE CROSS/BLUE SHIELD | Admitting: Obstetrics

## 2019-01-22 ENCOUNTER — Encounter: Payer: Self-pay | Admitting: Obstetrics

## 2019-01-22 VITALS — BP 127/87 | HR 69 | Wt 255.8 lb

## 2019-01-22 DIAGNOSIS — Z124 Encounter for screening for malignant neoplasm of cervix: Secondary | ICD-10-CM | POA: Diagnosis not present

## 2019-01-22 DIAGNOSIS — N760 Acute vaginitis: Secondary | ICD-10-CM

## 2019-01-22 DIAGNOSIS — B373 Candidiasis of vulva and vagina: Secondary | ICD-10-CM

## 2019-01-22 DIAGNOSIS — N898 Other specified noninflammatory disorders of vagina: Secondary | ICD-10-CM

## 2019-01-22 DIAGNOSIS — E66813 Obesity, class 3: Secondary | ICD-10-CM

## 2019-01-22 DIAGNOSIS — Z1151 Encounter for screening for human papillomavirus (HPV): Secondary | ICD-10-CM | POA: Diagnosis not present

## 2019-01-22 DIAGNOSIS — Z1239 Encounter for other screening for malignant neoplasm of breast: Secondary | ICD-10-CM

## 2019-01-22 DIAGNOSIS — B9689 Other specified bacterial agents as the cause of diseases classified elsewhere: Secondary | ICD-10-CM

## 2019-01-22 DIAGNOSIS — Z01419 Encounter for gynecological examination (general) (routine) without abnormal findings: Secondary | ICD-10-CM | POA: Diagnosis not present

## 2019-01-22 DIAGNOSIS — Z113 Encounter for screening for infections with a predominantly sexual mode of transmission: Secondary | ICD-10-CM | POA: Diagnosis not present

## 2019-01-22 DIAGNOSIS — N939 Abnormal uterine and vaginal bleeding, unspecified: Secondary | ICD-10-CM

## 2019-01-22 DIAGNOSIS — Z6841 Body Mass Index (BMI) 40.0 and over, adult: Secondary | ICD-10-CM

## 2019-01-22 NOTE — Progress Notes (Signed)
Pt presents for f/u after u/s, annual, pap, and all STD testing.

## 2019-01-22 NOTE — Progress Notes (Signed)
Subjective:        Nicole [name] is a 43 y.o. female here for a routine exam.  Current complaints: None.    Personal health questionnaire:  Is patient [name], have a family history of breast and/or ovarian cancer: no Is there a family history of uterine cancer diagnosed at age < 36, gastrointestinal cancer, urinary tract cancer, family member who is a Field seismologist syndrome-associated carrier: no Is the patient overweight and hypertensive, family history of diabetes, personal history of gestational diabetes, preeclampsia or PCOS: no Is patient over 42, have PCOS,  family history of premature CHD under age 101, diabetes, smoke, have hypertension or peripheral artery disease:  no At any time, has a partner hit, kicked or otherwise hurt or frightened you?: no Over the past 2 weeks, have you felt down, depressed or hopeless?: no Over the past 2 weeks, have you felt little interest or pleasure in doing things?:no   Gynecologic History Patient's last menstrual period was 12/23/2018. Contraception: Tubal Sterilization Last Pap: 2019. Results were: normal Last mammogram: none. Results were: none  Obstetric History OB History  Gravida Para Term Preterm AB Living  3 2 1 1 1 2   SAB TAB Ectopic Multiple Live Births  0 0 1 0 2    # Outcome Date GA Lbr Len/2nd Weight Sex Delivery Anes PTL Lv  3 Preterm 12/26/03    M Vag-Spont   LIV     Birth Comments: 42 wks, IOL for fetal distress  2 Term 05/17/95    F Vag-Spont  Y LIV  1 Ectopic             Past Medical History:  Diagnosis Date  . Anemia   . Asthma   . Atypical chest pain    a. ?pericarditis 2015.  Marland Kitchen Blood transfusion 2011   r/t anemia  . Fibroid   . Migraine   . Seizures ([place])   . Sickle cell trait ([place])   . Tooth abscess     Past Surgical History:  Procedure Laterality Date  . TUBAL LIGATION Bilateral 2005     Current Outpatient Medications:  .  acetaminophen (TYLENOL) 325 MG tablet, Take 650 mg by mouth every 6  (six) hours as needed for moderate pain or headache., Disp: , Rfl:  .  cetirizine (ZYRTEC) 10 MG tablet, Take 10 mg by mouth daily., Disp: , Rfl:  .  Fe Cbn-Fe Gluc-FA-B12-C-DSS (FERRALET 90) 90-1 MG TABS, Take 1 tablet by mouth daily before breakfast., Disp: 30 each, Rfl: 11 .  ferrous sulfate 325 (65 FE) MG tablet, Take 1 tablet (325 mg total) by mouth daily., Disp: 30 tablet, Rfl: 0 .  levETIRAcetam (KEPPRA) 1000 MG tablet, Take 1 tablet (1,000 mg total) by mouth 2 (two) times daily., Disp: 60 tablet, Rfl: 0 .  medroxyPROGESTERone (DEPO-PROVERA) 150 MG/ML injection, Inject 1 mL (150 mg total) into the muscle every 3 (three) months., Disp: 1 mL, Rfl: 4 .  naproxen (EC-NAPROSYN) 500 MG EC tablet, Take 1 tablet (500 mg total) by mouth 2 (two) times daily with a meal., Disp: 30 tablet, Rfl: 6 .  prenatal vitamin w/FE, FA (PRENATAL 1 + 1) 27-1 MG TABS tablet, Take 1 tablet by mouth daily before breakfast., Disp: 30 each, Rfl: 11 .  topiramate (TOPAMAX) 25 MG tablet, Take 3 tablets (75 mg total) by mouth 2 (two) times daily., Disp: 180 tablet, Rfl: 0 .  EPINEPHrine (EPIPEN 2-PAK) 0.3 mg/0.3 mL IJ SOAJ injection, Inject 0.3 mg into  the muscle as needed (for allergic reaction)., Disp: , Rfl:  .  metroNIDAZOLE (FLAGYL) 500 MG tablet, Take 1 tablet (500 mg total) by mouth 2 (two) times daily. (Patient not taking: Reported on 01/22/2019), Disp: 14 tablet, Rfl: 0 .  naproxen (NAPROSYN) 500 MG tablet, Take 1 tablet (500 mg total) by mouth 2 (two) times daily. (Patient not taking: Reported on 01/22/2019), Disp: 10 tablet, Rfl: 0 Allergies  Allergen Reactions  . Penicillins Anaphylaxis and Swelling    Has patient had a PCN reaction causing immediate rash, facial/tongue/throat swelling, SOB or lightheadedness with hypotension: Yes Has patient had a PCN reaction causing severe rash involving mucus membranes or skin necrosis: Yes Has patient had a PCN reaction that required hospitalization Yes- ed visit Has patient  had a PCN reaction occurring within the last 10 years: No-childhood allergy If all of the above answers are "NO", then may proceed with Cephalosporin use.   . Shrimp [Shellfish Allergy] Anaphylaxis  . Sulfa Antibiotics Anaphylaxis, Swelling and Rash  . Tea Anaphylaxis    All teas    Social History   Tobacco Use  . Smoking status: Former Smoker    Last attempt to quit: 03/27/2014    Years since quitting: 4.8  . Smokeless tobacco: Never Used  Substance Use Topics  . Alcohol use: No    Family History  Problem Relation Age of Onset  . Hypertension Mother   . Diabetes Mother   . Cancer Mother        Colon, brain, 2 other  . Asthma Mother   . Clotting disorder Mother   . Heart disease Mother        67, stents  . Stroke Mother        2012  . Diabetes Father   . Asthma Father   . Clotting disorder Father   . Sickle cell anemia Father   . Heart disease Father        2000, stents  . Hypertension Father   . Stroke Father        2007  . Diabetes Maternal Grandmother   . Asthma Sister   . Asthma Brother   . Asthma Paternal Grandmother   . Anesthesia problems Neg Hx       Review of Systems  Constitutional: negative for fatigue and weight loss Respiratory: negative for cough and wheezing Cardiovascular: negative for chest pain, fatigue and palpitations Gastrointestinal: negative for abdominal pain and change in bowel habits Musculoskeletal:negative for myalgias Neurological: negative for gait problems and tremors Behavioral/Psych: negative for abusive relationship, depression Endocrine: negative for temperature intolerance    Genitourinary:negative for abnormal menstrual periods, genital lesions, hot flashes, sexual problems and vaginal discharge Integument/breast: negative for breast lump, breast tenderness, nipple discharge and skin lesion(s)    Objective:       BP 127/87   Pulse 69   Wt 255 lb 12.8 oz (116 kg)   LMP 12/23/2018   BMI 42.57 kg/m  General:    alert  Skin:   no rash or abnormalities  Lungs:   clear to auscultation bilaterally  Heart:   regular rate and rhythm, S1, S2 normal, no murmur, click, rub or gallop  Breasts:   normal without suspicious masses, skin or nipple changes or axillary nodes  Abdomen:  normal findings: no organomegaly, soft, non-tender and no hernia  Pelvis:  External genitalia: normal general appearance Urinary system: urethral meatus normal and bladder without fullness, nontender Vaginal: normal without tenderness, induration or masses Cervix: normal  appearance Adnexa: normal bimanual exam Uterus: anteverted and non-tender, normal size   Lab Review Urine pregnancy test Labs reviewed yes Radiologic stu dies reviewed yes  50% of 20 min visit spent on counseling and coordination of care.   Assessment:     1. Encounter for routine gynecological examination with Papanicolaou smear of cervix Rx: - Cytology - PAP  2. Abnormal uterine bleeding (AUB) - on Depo Provera Injections  3. Vaginal discharge Rx: - Cervicovaginal ancillary only( [place])  4. Screening for STD (sexually transmitted disease) Rx: - Hepatitis B surface antigen - Hepatitis C antibody - HIV Antibody (routine testing w rflx) - RPR  5. Screening breast examination - screening mammogram ordered  6. Class 3 severe obesity due to excess calories without serious comorbidity with body mass index (BMI) of 40.0 to 44.9 in adult [facility]) - program of caloric reduction, exercise and behavioral modification recommended    Plan:    Education reviewed: calcium supplements, depression evaluation, low fat, low cholesterol diet, safe sex/STD prevention, self breast exams and weight bearing exercise. Contraception: Depo-Provera injections. Mammogram ordered. Follow up in: 1 year.   No orders of the defined types were placed in this encounter.  Orders Placed This Encounter  Procedures  . Hepatitis B surface antigen  . Hepatitis C  antibody  . HIV Antibody (routine testing w rflx)  . RPR    Shelly Bombard MD 01-22-2019

## 2019-01-23 LAB — HIV ANTIBODY (ROUTINE TESTING W REFLEX): HIV Screen 4th Generation wRfx: NONREACTIVE

## 2019-01-23 LAB — RPR: RPR Ser Ql: NONREACTIVE

## 2019-01-23 LAB — HEPATITIS B SURFACE ANTIGEN: HEP B S AG: NEGATIVE

## 2019-01-23 LAB — HEPATITIS C ANTIBODY: Hep C Virus Ab: <0.1 s/co ratio (ref 0.0–0.9)

## 2019-01-23 LAB — CERVICOVAGINAL ANCILLARY ONLY
Bacterial vaginitis: POSITIVE — AB
Candida vaginitis: POSITIVE — AB
Chlamydia: NEGATIVE
Neisseria Gonorrhea: NEGATIVE
TRICH (WINDOWPATH): NEGATIVE

## 2019-01-24 ENCOUNTER — Other Ambulatory Visit: Payer: Self-pay | Admitting: Obstetrics

## 2019-01-24 DIAGNOSIS — B373 Candidiasis of vulva and vagina: Secondary | ICD-10-CM

## 2019-01-24 DIAGNOSIS — B3731 Acute candidiasis of vulva and vagina: Secondary | ICD-10-CM

## 2019-01-24 DIAGNOSIS — N760 Acute vaginitis: Principal | ICD-10-CM

## 2019-01-24 DIAGNOSIS — B9689 Other specified bacterial agents as the cause of diseases classified elsewhere: Secondary | ICD-10-CM

## 2019-01-24 LAB — CYTOLOGY - PAP
Diagnosis: NEGATIVE
HPV: NOT DETECTED

## 2019-01-24 MED ORDER — METRONIDAZOLE 500 MG PO TABS: 500.0000 mg | ORAL_TABLET | Freq: Two times a day (BID) | ORAL | 2 refills | Status: DC

## 2019-01-24 MED ORDER — FLUCONAZOLE 150 MG PO TABS: 150.0000 mg | ORAL_TABLET | Freq: Once | ORAL | 2 refills | Status: AC

## 2019-02-13 ENCOUNTER — Telehealth: Payer: Self-pay

## 2019-02-13 NOTE — Telephone Encounter (Signed)
TC from pt stating she was told by Dr.Harper to call if Bleeding returned Pt notes she is bleeding it was discussed per pt to maybe start birth control   Please advise.

## 2019-02-14 ENCOUNTER — Telehealth: Payer: Self-pay

## 2019-02-14 ENCOUNTER — Telehealth: Payer: Self-pay | Admitting: Obstetrics

## 2019-02-14 ENCOUNTER — Other Ambulatory Visit: Payer: Self-pay | Admitting: Obstetrics

## 2019-02-14 DIAGNOSIS — N939 Abnormal uterine and vaginal bleeding, unspecified: Secondary | ICD-10-CM

## 2019-02-14 MED ORDER — NORETHINDRONE-ETH ESTRADIOL 1-35 MG-MCG PO TABS: 1.0000 | ORAL_TABLET | Freq: Every day | ORAL | 2 refills | Status: DC

## 2019-02-14 NOTE — Telephone Encounter (Signed)
Patient called for BTB on Depo Provera Injections.  OCP's recommended for stabilization.  Ortho Novum 1/35 Rx.

## 2019-02-14 NOTE — Telephone Encounter (Signed)
Pt calling again regarding medication mangement for vaginal bleeding. Please Advise.

## 2019-02-17 ENCOUNTER — Telehealth: Payer: Self-pay | Admitting: *Deleted

## 2019-02-17 ENCOUNTER — Other Ambulatory Visit: Payer: Self-pay | Admitting: Obstetrics

## 2019-02-17 DIAGNOSIS — N946 Dysmenorrhea, unspecified: Secondary | ICD-10-CM

## 2019-02-17 MED ORDER — NAPROXEN 500 MG PO TABS: 500.0000 mg | ORAL_TABLET | Freq: Two times a day (BID) | ORAL | 5 refills | Status: DC

## 2019-02-17 NOTE — Telephone Encounter (Signed)
Naprosyn Rx

## 2019-02-17 NOTE — Telephone Encounter (Signed)
Fax from pharmacy requesting change in Rx. Naproxen DR is currently on backorder.  Pharmacy is would like new Rx for Naproxen 500mg  since DR is on backorder.   Please send new Rx if approved.

## 2019-02-25 ENCOUNTER — Ambulatory Visit: Payer: Self-pay

## 2019-02-26 ENCOUNTER — Other Ambulatory Visit: Payer: Self-pay | Admitting: Obstetrics

## 2019-02-26 DIAGNOSIS — Z1239 Encounter for other screening for malignant neoplasm of breast: Secondary | ICD-10-CM

## 2019-03-05 ENCOUNTER — Telehealth: Payer: Self-pay

## 2019-03-05 IMAGING — CT CT HEAD W/O CM
3 series · 16 of 47 positions shown, 19 images · non-contrast
Comparison: None.

CLINICAL DATA: Head trauma, subacute, neural/cognitive deficit.
Witnessed seizure.

EXAM:
CT HEAD WITHOUT CONTRAST
TECHNIQUE: Contiguous axial images were obtained from the base of the skull
through the vertex without intravenous contrast.

[Series 3: head wo · axial · 0.45mm/px · z∈[-399,-264]mm · 10 of 33 slices shown, 13 images]
[im 3/33  brain]
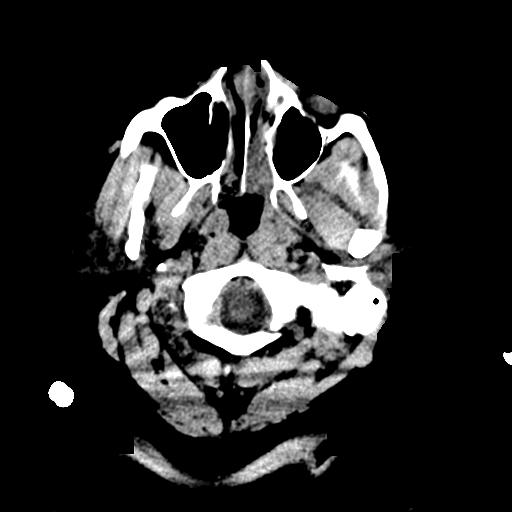
[im 3/33  bone]
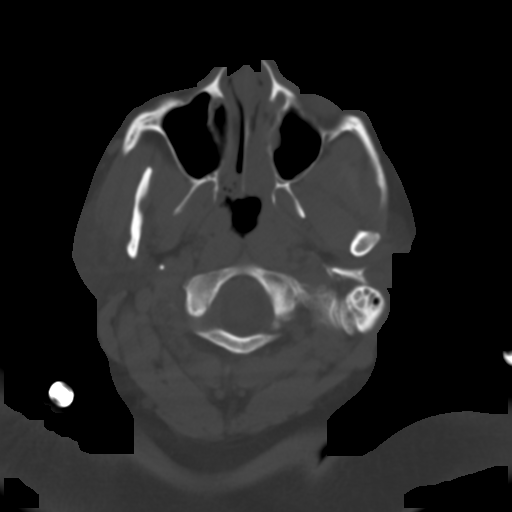
[im 6/33  brain]
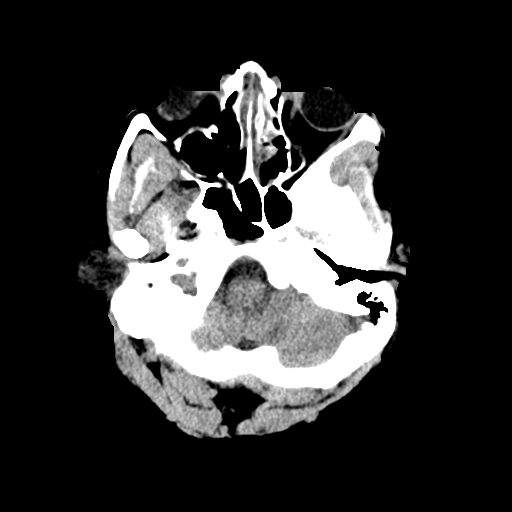
[im 9/33  brain]
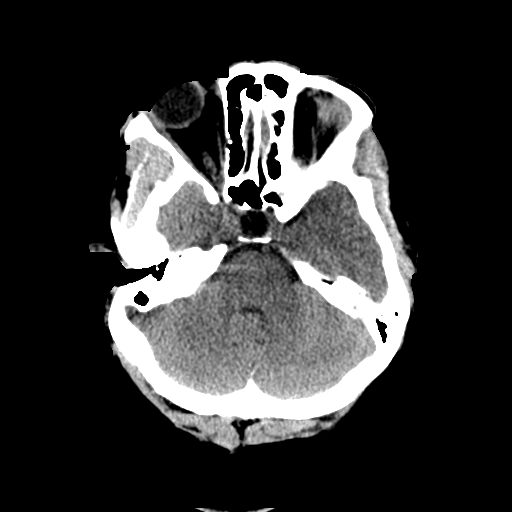
[im 12/33  brain]
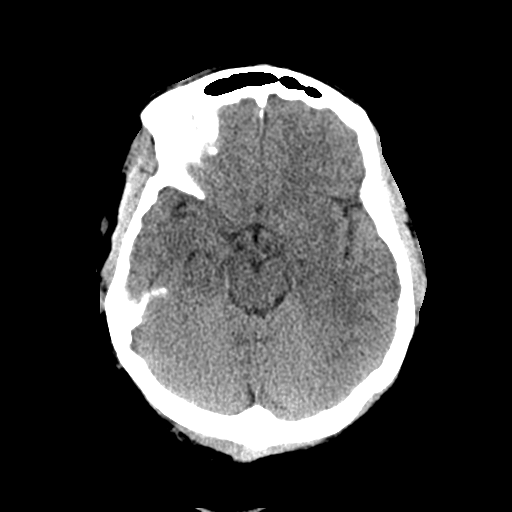
[im 15/33  brain]
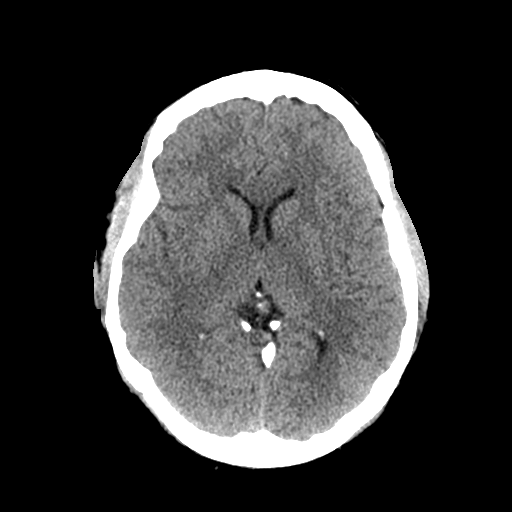
[im 15/33  bone]
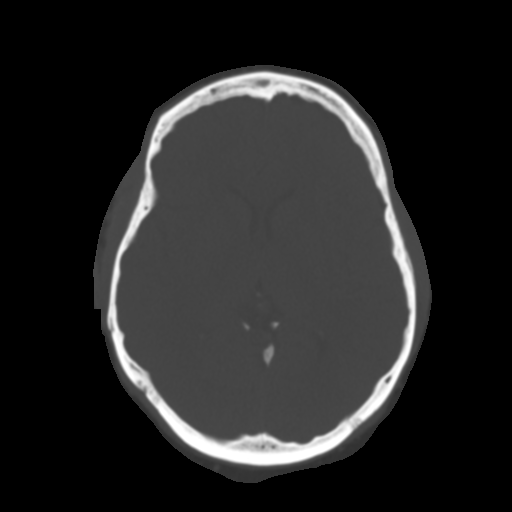
[im 18/33  brain]
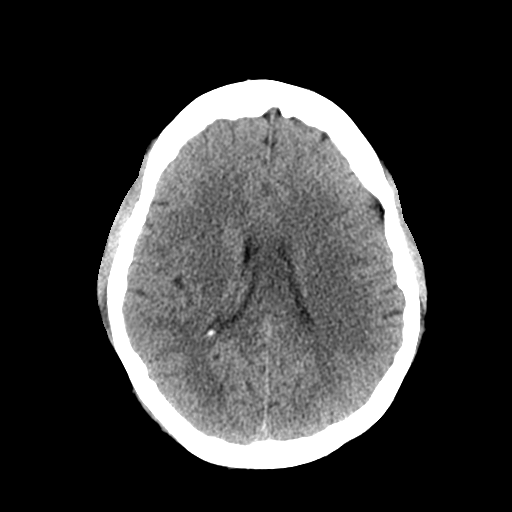
[im 21/33  brain]
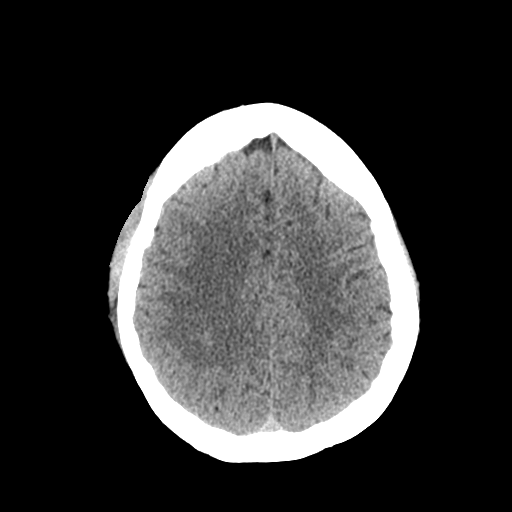
[im 25/33  brain]
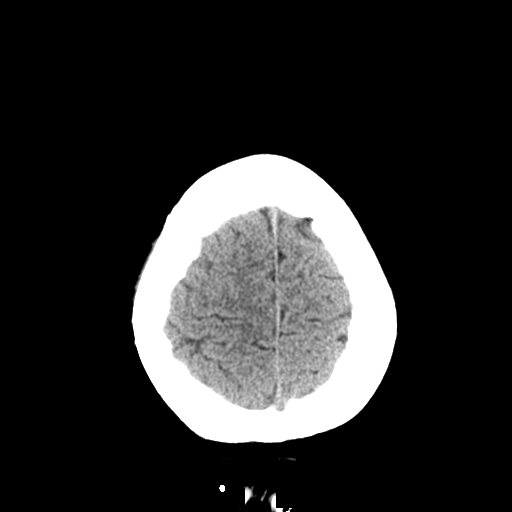
[im 27/33  brain]
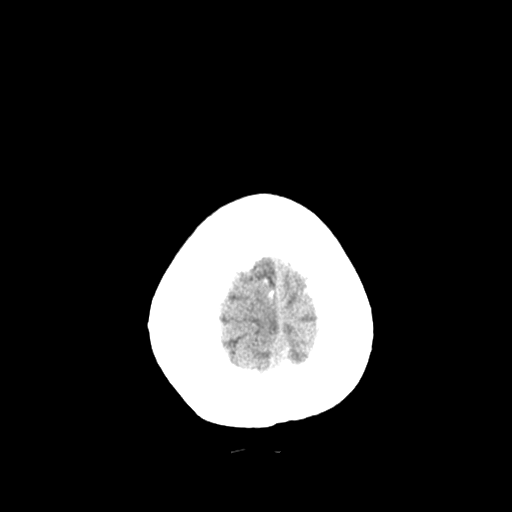
[im 27/33  bone]
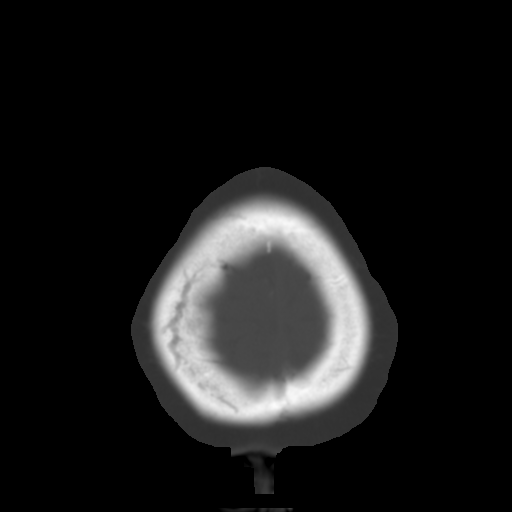
[im 30/33  brain]
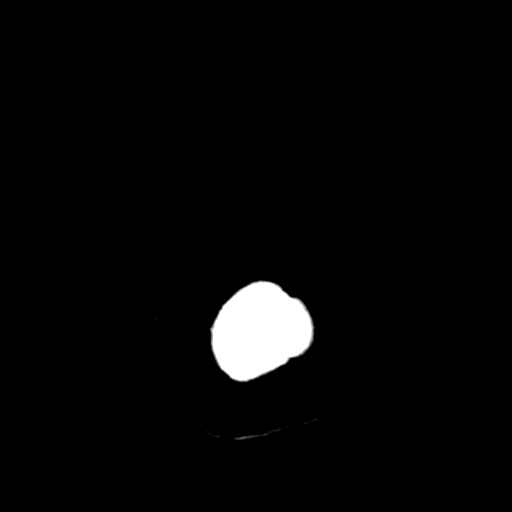

[Series 5: coronal soft tissue · coronal · 0.33mm/px · 3 of 71 slices shown]
[im 24/71  brain]
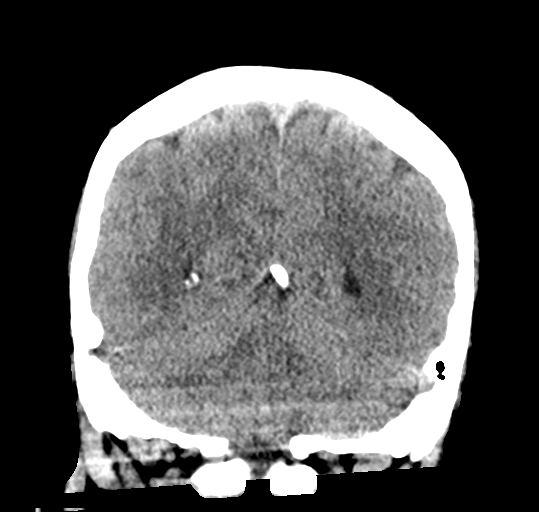
[im 32/71  brain]
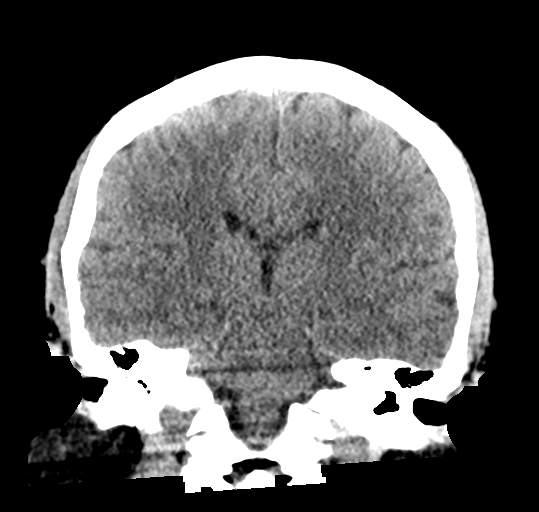
[im 39/71  brain]
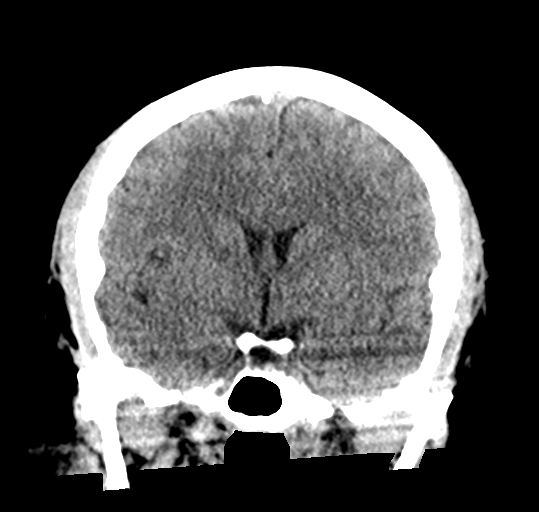

[Series 6: sagittal soft tissue · sagittal · 0.33mm/px · 3 of 61 slices shown]
[im 21/61  brain]
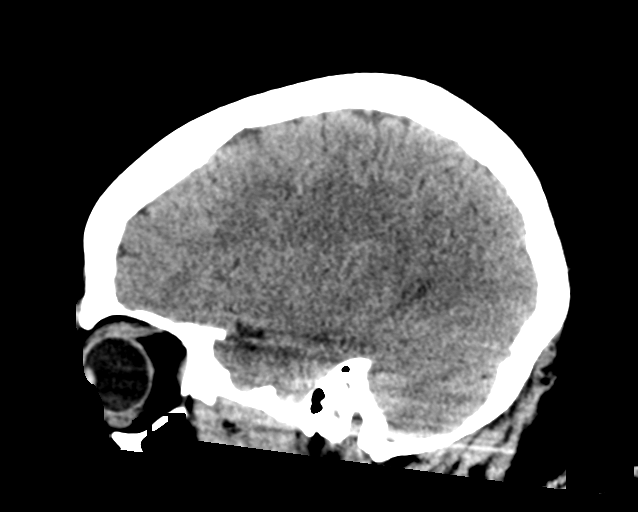
[im 31/61  brain]
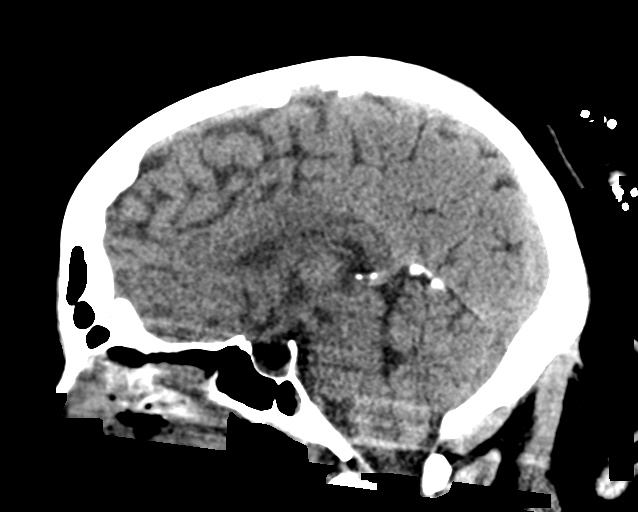
[im 41/61  brain]
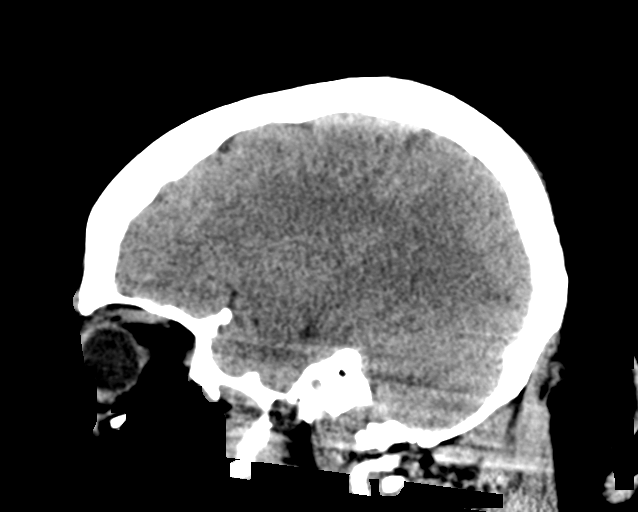

[16 of 47 positions shown; findings below may reference images not displayed]

FINDINGS: Brain: No intracranial hemorrhage, mass effect, or midline shift. No
hydrocephalus. The basilar cisterns are patent. No evidence of
territorial infarct or acute ischemia. Incidental tentorial
calcifications. No extra-axial or intracranial fluid collection.

Vascular: No hyperdense vessel or unexpected calcification.

Skull: No fracture or focal lesion.

Sinuses/Orbits: Scattered mucosal thickening of the ethmoid air
cells and sphenoid sinuses. Minimal opacification of lower left
mastoid air cells.

Other: None.
IMPRESSION: 1.  No acute intracranial abnormality.
2. Mild paranasal sinus inflammatory change, of uncertain
chronicity.

## 2019-03-05 NOTE — Telephone Encounter (Signed)
TC from pt stating she is still bleeding and does not understand why. Pt wants a call back with what to do for vaginal bleeding.

## 2019-03-07 ENCOUNTER — Telehealth: Payer: Self-pay | Admitting: Obstetrics

## 2019-03-07 NOTE — Telephone Encounter (Signed)
Returned call to patient for complaint of facial swelling this morning.  She had started a taper of OCP's for AUB yesterday and had taken 2 of the pills.  She denies any facial pain, numbness or weakness.  The swelling is unilateral..  She was advised to go to the [facility] ER for evaluation.

## 2019-03-26 ENCOUNTER — Other Ambulatory Visit: Payer: Self-pay | Admitting: Obstetrics

## 2019-03-28 ENCOUNTER — Ambulatory Visit (INDEPENDENT_AMBULATORY_CARE_PROVIDER_SITE_OTHER): Payer: BLUE CROSS/BLUE SHIELD

## 2019-03-28 ENCOUNTER — Other Ambulatory Visit: Payer: Self-pay

## 2019-03-28 DIAGNOSIS — Z3042 Encounter for surveillance of injectable contraceptive: Secondary | ICD-10-CM

## 2019-03-28 MED ORDER — MEDROXYPROGESTERONE ACETATE 150 MG/ML IM SUSP
150.0000 mg | Freq: Once | INTRAMUSCULAR | Status: AC
  Administered 2019-03-28: 150 mg via INTRAMUSCULAR

## 2019-03-28 NOTE — Progress Notes (Signed)
Nurse visit for Depo. Pt is within her window.  Injection given RUOQ w/o difficulty. Next Depo due Jul 3-17, pt agrees.

## 2019-03-28 NOTE — Progress Notes (Signed)
I have reviewed this chart and agree with the RN/CMA assessment and management.    Adriana Quinby C Carime Dinkel, MD, FACOG Attending Physician, [facility]  

## 2019-03-28 NOTE — Addendum Note (Signed)
Addended by: Maryruth Eve on: 03/28/2019 11:06 AM   Modules accepted: Orders

## 2019-04-17 ENCOUNTER — Telehealth: Payer: Self-pay | Admitting: *Deleted

## 2019-04-17 NOTE — Telephone Encounter (Signed)
Pt called to office to discuss bleeding.  Return call. Pt states she has been bleeding for the past 3-4 months.  Pt says that she has had depo shot and pills and neither have helped with bleeding. Pt states she is also having a lot of cramping and passing clots.  Pt made aware message to be sent to provider for recommendations.  Pt used [facility] on [address] in Rx is needed.     Please advise.

## 2019-04-18 NOTE — Telephone Encounter (Signed)
Patient needs appointment in office next week.

## 2019-04-23 ENCOUNTER — Ambulatory Visit: Payer: Self-pay

## 2019-04-25 NOTE — Telephone Encounter (Signed)
Spoke with pt about bleeding. Pt states she is taking another pack of pills as directed and bleeding is doing better, now just spotting. Pt made aware that Dr Jodi Mourning recommends appt if problem continues.  Pt made aware that he is not yet seeing pt's in office but she may see another provider.   Pt states she will monitor bleeding at home and advised she call office if bleeding returns to heavy or does not stop.

## 2019-05-07 ENCOUNTER — Ambulatory Visit: Payer: BLUE CROSS/BLUE SHIELD

## 2019-05-12 ENCOUNTER — Other Ambulatory Visit: Payer: Self-pay | Admitting: Obstetrics

## 2019-05-12 DIAGNOSIS — N939 Abnormal uterine and vaginal bleeding, unspecified: Secondary | ICD-10-CM

## 2019-05-17 ENCOUNTER — Emergency Department (HOSPITAL_COMMUNITY): Payer: BLUE CROSS/BLUE SHIELD

## 2019-05-17 ENCOUNTER — Emergency Department (HOSPITAL_COMMUNITY)
Admission: EM | Admit: 2019-05-17 | Discharge: 2019-05-17 | Disposition: A | Discharge status: Home / Self Care | Payer: BLUE CROSS/BLUE SHIELD | Attending: Emergency Medicine | Admitting: Emergency Medicine

## 2019-05-17 ENCOUNTER — Other Ambulatory Visit: Payer: Self-pay

## 2019-05-17 ENCOUNTER — Encounter (HOSPITAL_COMMUNITY): Payer: Self-pay

## 2019-05-17 DIAGNOSIS — R569 Unspecified convulsions: Secondary | ICD-10-CM | POA: Insufficient documentation

## 2019-05-17 DIAGNOSIS — Z9114 Patient's other noncompliance with medication regimen: Secondary | ICD-10-CM | POA: Diagnosis not present

## 2019-05-17 DIAGNOSIS — R4182 Altered mental status, unspecified: Secondary | ICD-10-CM | POA: Diagnosis present

## 2019-05-17 DIAGNOSIS — Z87891 Personal history of nicotine dependence: Secondary | ICD-10-CM | POA: Insufficient documentation

## 2019-05-17 DIAGNOSIS — Z79899 Other long term (current) drug therapy: Secondary | ICD-10-CM | POA: Insufficient documentation

## 2019-05-17 DIAGNOSIS — R410 Disorientation, unspecified: Secondary | ICD-10-CM

## 2019-05-17 DIAGNOSIS — J45909 Unspecified asthma, uncomplicated: Secondary | ICD-10-CM | POA: Insufficient documentation

## 2019-05-17 HISTORY — DX: Cerebral infarction, unspecified: I63.9

## 2019-05-17 LAB — CBC WITH DIFFERENTIAL/PLATELET
Abs Immature Granulocytes: 0.01 10*3/uL (ref 0.00–0.07)
Basophils Absolute: 0.1 10*3/uL (ref 0.0–0.1)
Basophils Relative: 1 %
Eosinophils Absolute: 0.4 10*3/uL (ref 0.0–0.5)
Eosinophils Relative: 8 %
HCT: 36.2 % (ref 36.0–46.0)
Hemoglobin: 12.2 g/dL (ref 12.0–15.0)
Immature Granulocytes: 0 %
Lymphocytes Relative: 43 %
Lymphs Abs: 2.4 10*3/uL (ref 0.7–4.0)
MCH: 29.3 pg (ref 26.0–34.0)
MCHC: 33.7 g/dL (ref 30.0–36.0)
MCV: 86.8 fL (ref 80.0–100.0)
Monocytes Absolute: 0.5 10*3/uL (ref 0.1–1.0)
Monocytes Relative: 10 %
Neutro Abs: 2.1 10*3/uL (ref 1.7–7.7)
Neutrophils Relative %: 38 %
Platelets: 285 10*3/uL (ref 150–400)
RBC: 4.17 MIL/uL (ref 3.87–5.11)
RDW: 12.2 % (ref 11.5–15.5)
WBC: 5.6 10*3/uL (ref 4.0–10.5)
nRBC: 0 % (ref 0.0–0.2)

## 2019-05-17 LAB — BASIC METABOLIC PANEL
Anion gap: 8 (ref 5–15)
BUN: 8 mg/dL (ref 6–20)
CO2: 20 mmol/L — ABNORMAL LOW (ref 22–32)
Calcium: 8.9 mg/dL (ref 8.9–10.3)
Chloride: 109 mmol/L (ref 98–111)
Creatinine, Ser: 0.7 mg/dL (ref 0.44–1.00)
GFR calc Af Amer: >60 mL/min (ref >60)
GFR calc non Af Amer: >60 mL/min (ref >60)
Glucose, Bld: 79 mg/dL (ref 70–99)
Potassium: 3.8 mmol/L (ref 3.5–5.1)
Sodium: 137 mmol/L (ref 135–145)

## 2019-05-17 LAB — URINALYSIS, ROUTINE W REFLEX MICROSCOPIC
Bilirubin Urine: NEGATIVE
Glucose, UA: NEGATIVE mg/dL
Hgb urine dipstick: NEGATIVE
Ketones, ur: NEGATIVE mg/dL
Leukocytes,Ua: NEGATIVE
Nitrite: NEGATIVE
Protein, ur: NEGATIVE mg/dL
Specific Gravity, Urine: 1.008 (ref 1.005–1.030)
pH: 6 (ref 5.0–8.0)

## 2019-05-17 LAB — CBG MONITORING, ED: Glucose-Capillary: 73 mg/dL (ref 70–99)

## 2019-05-17 LAB — RAPID URINE DRUG SCREEN, HOSP PERFORMED
Amphetamines: NOT DETECTED
Barbiturates: NOT DETECTED
Benzodiazepines: NOT DETECTED
Cocaine: NOT DETECTED
Opiates: POSITIVE — AB
Tetrahydrocannabinol: NOT DETECTED

## 2019-05-17 LAB — I-STAT BETA HCG BLOOD, ED (MC, WL, AP ONLY): I-stat hCG, quantitative: <5 m[IU]/mL (ref <5)

## 2019-05-17 LAB — ACETAMINOPHEN LEVEL: Acetaminophen (Tylenol), Serum: <10 ug/mL — ABNORMAL LOW (ref 10–30)

## 2019-05-17 LAB — ETHANOL: Alcohol, Ethyl (B): <10 mg/dL (ref <10)

## 2019-05-17 LAB — SALICYLATE LEVEL: Salicylate Lvl: <7 mg/dL (ref 2.8–30.0)

## 2019-05-17 MED ORDER — SODIUM CHLORIDE 0.9 % IV BOLUS
1000.0000 mL | Freq: Once | INTRAVENOUS | Status: AC
  Administered 2019-05-17: 15:00:00 1000 mL via INTRAVENOUS

## 2019-05-17 MED ORDER — LEVETIRACETAM IN NACL 1000 MG/100ML IV SOLN
1000.0000 mg | Freq: Once | INTRAVENOUS | Status: AC
  Administered 2019-05-17: 1000 mg via INTRAVENOUS
  Filled 2019-05-17: qty 100

## 2019-05-17 MED ORDER — LEVETIRACETAM 1000 MG PO TABS: 1000.0000 mg | ORAL_TABLET | Freq: Two times a day (BID) | ORAL | 1 refills | Status: DC

## 2019-05-17 NOTE — ED Triage Notes (Signed)
Pt from home with [facility], 7am unwitnessed seizure, pt called family after and told them she had a seizure. Pt was taking keppra but stopped taking it due to it making her sleepy. LSN 11pm last night. Pt alert, oriented to self. Bilateral leg weakness, no other neuro deficits.   CBG73 BP 124/85 P 70 95% room air.

## 2019-05-17 NOTE — ED Provider Notes (Signed)
[facility]ERG[facility] DEPARTMENT Provider Note   CSN: [id number] Arrival date & time: 05/17/19  1359    History   Chief Complaint Chief Complaint  Patient presents with   Altered Mental Status   Seizures    HPI Nicole [name] is a 43 y.o. female.     The history is provided by the patient.  Altered Mental Status  Presenting symptoms: lethargy   Severity:  Mild Most recent episode:  Today Timing:  Constant Progression:  Partially resolved Chronicity:  Recurrent Context comment:  Hx of seizures with breakthrough today, possible multiple siezures. Patient non-compliant with keppra as it makes her sleepy.  Associated symptoms: weakness (generalized)   Associated symptoms: no abdominal pain, normal movement, no decreased appetite, no depression, no fever, no headaches, no light-headedness, no palpitations, no rash, no seizures, no visual change and no vomiting     Past Medical History:  Diagnosis Date   Anemia    Asthma    Atypical chest pain    a. ?pericarditis 2015.   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[facility])    Depression    Generalized abdominal pain    Fatigue 10/28/2015   Adjustment disorder with depressed mood 10/21/2015   Dizziness    Weakness 11/18/2014   OSA (obstructive sleep apnea) 10/21/2014   Acute rhinosinusitis 10/09/2014   Leg pain, lateral 09/04/2014   Migraines 08/13/2014   Seizure ([place]) 06/04/2014   Leiomyoma of uterus, unspecified 02/11/2014   GERD (gastroesophageal reflux disease) 01/28/2014   Candidiasis of vulva and vagina 01/28/2014   Unspecified symptom associated with female genital organs 01/28/2014   Chest pain 01/20/2014   Acute pericarditis, unspecified 01/20/2014    Past Surgical History:  Procedure  Laterality Date   TUBAL LIGATION Bilateral 2005     OB History    Gravida  3   Para  2   Term  1   Preterm  1   AB  1   Living  2     SAB  0   TAB  0   Ectopic  1   Multiple  0   Live Births  2            Home Medications    Prior to Admission medications   Medication Sig Start Date End Date Taking? Authorizing Provider  acetaminophen (TYLENOL) 325 MG tablet Take 650 mg by mouth every 6 (six) hours as needed for moderate pain or headache.    [provider]  ALAYCEN 1/35 tablet TAKE 1 TABLET BY MOUTH EVERY DAY 05/12/19   Shelly Bombard, MD  cetirizine (ZYRTEC) 10 MG tablet Take 10 mg by mouth daily.    [provider]  EPINEPHrine (EPIPEN 2-PAK) 0.3 mg/0.3 mL IJ SOAJ injection Inject 0.3 mg into the muscle as needed (for allergic reaction).    [provider]  Fe Cbn-Fe Gluc-FA-B12-C-DSS (FERRALET 90) 90-1 MG TABS Take 1 tablet by mouth daily before breakfast. 01/08/19   Shelly Bombard, MD  ferrous sulfate 325 (65 FE) MG tablet Take 1 tablet (325 mg total) by mouth daily. 12/29/18   Petrucelli, Samantha R, PA-C  levETIRAcetam (KEPPRA) 1000 MG tablet Take 1 tablet (1,000 mg total) by  mouth 2 (two) times daily. 07/07/18   Molpus, John, MD  medroxyPROGESTERone (DEPO-PROVERA) 150 MG/ML injection Inject 1 mL (150 mg total) into the muscle every 3 (three) months. 01/08/19   Shelly Bombard, MD  metroNIDAZOLE (FLAGYL) 500 MG tablet Take 1 tablet (500 mg total) by mouth 2 (two) times daily. 01/24/19   Shelly Bombard, MD  naproxen (NAPROSYN) 500 MG tablet Take 1 tablet (500 mg total) by mouth 2 (two) times daily with a meal. 02/17/19   Shelly Bombard, MD  prenatal vitamin w/FE, FA (PRENATAL 1 + 1) 27-1 MG TABS tablet Take 1 tablet by mouth daily before breakfast. 01/08/19   Shelly Bombard, MD  topiramate (TOPAMAX) 25 MG tablet Take 3 tablets (75 mg total) by mouth 2 (two) times daily. 07/07/18   Molpus, John, MD    Family History Family  History  Problem Relation Age of Onset   Hypertension Mother    Diabetes Mother    Cancer Mother        Colon, brain, 2 other   Asthma Mother    Clotting disorder Mother    Heart disease Mother        17, stents   Stroke Mother        2012   Diabetes Father    Asthma Father    Clotting disorder Father    Sickle cell anemia Father    Heart disease Father        2000, stents   Hypertension Father    Stroke Father        2007   Diabetes Maternal Grandmother    Asthma Sister    Asthma Brother    Asthma Paternal Grandmother    Anesthesia problems Neg Hx     Social History Social History   Tobacco Use   Smoking status: Former Smoker    Last attempt to quit: 03/27/2014    Years since quitting: 5.1   Smokeless tobacco: Never Used  Substance Use Topics   Alcohol use: No   Drug use: No     Allergies   Penicillins; Shrimp [shellfish allergy]; Sulfa antibiotics; and Tea   Review of Systems Review of Systems  Constitutional: Negative for chills, decreased appetite and fever.  HENT: Negative for ear pain and sore throat.   Eyes: Negative for pain and visual disturbance.  Respiratory: Negative for cough and shortness of breath.   Cardiovascular: Negative for chest pain and palpitations.  Gastrointestinal: Negative for abdominal pain and vomiting.  Genitourinary: Negative for dysuria and hematuria.  Musculoskeletal: Negative for arthralgias and back pain.  Skin: Negative for color change and rash.  Neurological: Positive for weakness (generalized). Negative for dizziness, tremors, seizures, syncope, facial asymmetry, speech difficulty, light-headedness, numbness and headaches.  All other systems reviewed and are negative.    Physical Exam Updated Vital Signs  ED Triage Vitals  Enc Vitals Group     BP 05/17/19 1427 (!) 130/93     Pulse Rate 05/17/19 1422 66     Resp 05/17/19 1422 16     Temp 05/17/19 1422 99.5 F (37.5 C)     Temp Source  05/17/19 1422 Oral     SpO2 05/17/19 1422 97 %     Weight --      Height --      Head Circumference --      Peak Flow --      Pain Score 05/17/19 1426 0     Pain Loc --  Pain Edu? --      Excl. in [place]? --      Physical Exam Vitals signs and nursing note reviewed.  Constitutional:      General: She is not in acute distress.    Appearance: She is well-developed. She is obese. She is not ill-appearing.  HENT:     Head: Normocephalic and atraumatic.     Nose: Nose normal.     Mouth/Throat:     Mouth: Mucous membranes are moist.  Eyes:     Conjunctiva/sclera: Conjunctivae normal.  Neck:     Musculoskeletal: Normal range of motion and neck supple. No neck rigidity or muscular tenderness.  Cardiovascular:     Rate and Rhythm: Normal rate and regular rhythm.     Pulses: Normal pulses.     Heart sounds: Normal heart sounds. No murmur.  Pulmonary:     Effort: Pulmonary effort is normal. No respiratory distress.     Breath sounds: Normal breath sounds.  Abdominal:     Palpations: Abdomen is soft.     Tenderness: There is no abdominal tenderness.  Musculoskeletal: Normal range of motion.        General: No tenderness.  Skin:    General: Skin is warm and dry.     Capillary Refill: Capillary refill takes less than 2 seconds.  Neurological:     General: No focal deficit present.     Mental Status: She is alert.     Comments: Cranial nerves appear intact, normal speech, poor effort with moving her limbs but no obvious muscle weakness or sensation changes, no drift      ED Treatments / Results  Labs (all labs ordered are listed, but only abnormal results are displayed) Labs Reviewed  BASIC METABOLIC PANEL - Abnormal; Notable for the following components:      Result Value   CO2 20 (*)    All other components within normal limits  CBC WITH DIFFERENTIAL/PLATELET  URINALYSIS, ROUTINE W REFLEX MICROSCOPIC  ETHANOL  RAPID URINE DRUG SCREEN, HOSP PERFORMED  ACETAMINOPHEN  LEVEL  SALICYLATE LEVEL  CBG MONITORING, ED  I-STAT BETA HCG BLOOD, ED ([facility], [facility], [facility] ONLY)    EKG EKG Interpretation  Date/Time:  Saturday May 17 2019 14:04:35 EDT Ventricular Rate:  69 PR Interval:    QRS Duration: 88 QT Interval:  388 QTC Calculation: 416 R Axis:   60 Text Interpretation:  Sinus rhythm Confirmed by Lennice Sites [id number]) on 05/17/2019 2:12:47 PM   Radiology Dg Chest 2 View  Result Date: 05/17/2019 CLINICAL DATA:  Altered mental status. EXAM: CHEST - 2 VIEW COMPARISON:  None. FINDINGS: The heart size and mediastinal contours are within normal limits. Normal pulmonary vascularity. Low lung volumes with mild bibasilar atelectasis. No focal consolidation, pleural effusion, or pneumothorax. No acute osseous abnormality. IMPRESSION: 1. Low lung volumes with mild bibasilar atelectasis. Electronically Signed   By: Titus Dubin M.D.   On: 05/17/2019 15:27    Procedures Procedures (including critical care time)  Medications Ordered in ED Medications  sodium chloride 0.9 % bolus 1,000 mL (1,000 mLs Intravenous New Bag/Given 05/17/19 1430)  levETIRAcetam (KEPPRA) IVPB 1000 mg/100 mL premix (0 mg Intravenous Stopped 05/17/19 1531)     Initial Impression / Assessment and Plan / ED Course  I have reviewed the triage vital signs and the nursing notes.  Pertinent labs & imaging results that were available during my care of the patient were reviewed by me and considered in my medical decision making (see  chart for details).     Nicole [name] is a 43 year old female history of migraines, seizures who presents the ED after seizure-like activity today.  Patient with overall unremarkable vitals.  Blood sugar 73.  Patient not back to her baseline.  Unsure of how many seizures today.  Patient feels generally weak and states that it is difficult for her to move all of her extremities.  She denies any headache, neck pain.  She is able to answer questions and follow commands but she  appears sleepy.  Talked with her husband on the phone who states that she does not take her Keppra and patient also says this as well as it makes her too sleepy at baseline.  Patient had one seizure this morning according to her husband and then he left to go to work.  Family came over and checked her and she was confused and more weak than normal.  Overall neurological exam is nonfocal.  Patient has normal range of motion of the neck without any issues.  Has poor effort when trying to lift her legs or arm but sensation is intact.  Likely patient postictal due to noncompliance.  However given poor history as most of today's events have been unwitnessed we will get a CT scan of the head and neck.  We will get basic labs.  Will give patient normal saline bolus and load the patient with IV Keppra and reevaluate. Low concern for infectious process at this time.  Patient with no significant leukocytosis, anemia, electrolyte abnormality.  Pregnancy test is negative.  Patient was handed off to oncoming ED staff with her pending urinalysis, CT imaging.  She will need reevaluation to see if she has returned back to her baseline.  Husband states that she has been depressed and will further expand work-up with Tylenol level, salicylate level, ethanol level, UDS.  This chart was dictated using voice recognition software.  Despite best efforts to proofread,  errors can occur which can change the documentation meaning.    Final Clinical Impressions(s) / ED Diagnoses   Final diagnoses:  Seizure [facility])  Confusion    ED Discharge Orders    None       Lennice Sites, DO 05/17/19 1532

## 2019-05-17 NOTE — ED Notes (Signed)
Pt given cranberry juice, declined food

## 2019-05-17 NOTE — ED Notes (Signed)
Pt verbalized understanding of d/c instructions and has no further questions, VSS, NAD.  

## 2019-05-17 NOTE — ED Notes (Signed)
Pt to follow up with neuro and given prescriptions for keppra. States that she had been out of it for a year.

## 2019-05-17 NOTE — ED Provider Notes (Signed)
.    Patient's care signed out to follow-up CT scan results and reassess.  On reassessment patient feels close to normal mild fatigue.  Normal neurologic exam.  No fever.  Patient admits to not taking Keppra and understands she needs to take it to decrease the number seizure she has.  Stable for discharge.  Golda Acre, MD 05/17/19 [phone]

## 2019-05-17 NOTE — ED Notes (Signed)
Pt ambulated in room with 2 staff assist. Pt a little unsteady but feels better and is ready to go home

## 2019-05-17 NOTE — Discharge Instructions (Signed)
Please take your keppra. Return for new concerns. Please have someone with you the next 24 hrs incase you have another seizure.   Thank you

## 2019-05-17 NOTE — ED Notes (Signed)
Patient transported to X-ray 

## 2019-06-18 ENCOUNTER — Ambulatory Visit (INDEPENDENT_AMBULATORY_CARE_PROVIDER_SITE_OTHER): Payer: BLUE CROSS/BLUE SHIELD

## 2019-06-18 ENCOUNTER — Other Ambulatory Visit: Payer: Self-pay

## 2019-06-18 DIAGNOSIS — Z3042 Encounter for surveillance of injectable contraceptive: Secondary | ICD-10-CM

## 2019-06-18 MED ORDER — MEDROXYPROGESTERONE ACETATE 150 MG/ML IM SUSP
150.0000 mg | Freq: Once | INTRAMUSCULAR | Status: AC
  Administered 2019-06-18: 11:00:00 150 mg via INTRAMUSCULAR

## 2019-06-18 NOTE — Progress Notes (Signed)
Pt is here for depo injection. She is on time and within her window. Injection given in RUOQ without difficulty. Next injection due 9/23-10/7.

## 2019-06-23 ENCOUNTER — Ambulatory Visit: Payer: Self-pay

## 2019-06-23 NOTE — Telephone Encounter (Signed)
Pt called wondering if she should be tested for COVID-19.  She states that she has a stuffy nose and sneezes. She states that she suffers from allergies most all the time and these are the same as years past. She states her allergies start when the mowing starts. She denies cough, fever, SOB, sore throat, chest pain and body aches. Care advice for testing read to patient.  She states she has already been tested in April and her test was negative. Pt has no PCP. Call transferred to [facility] office for establish care visit.  Reason for Disposition . COVID-19 Testing, questions about  Answer Assessment - Initial Assessment Questions 1. COVID-19 DIAGNOSIS: "Who made your Coronavirus (COVID-19) diagnosis?" "Was it confirmed by a positive lab test?" If not diagnosed by a HCP, ask "Are there lots of cases (community spread) where you live?" (See public health department website, if unsure)     [place] 2. ONSET: "When did the COVID-19 symptoms start?"     Allergies sneezing  3. WORST SYMPTOM: "What is your worst symptom?" (e.g., cough, fever, shortness of breath, muscle aches)     Stuffy nose 4. COUGH: "Do you have a cough?" If so, ask: "How bad is the cough?"       no 5. FEVER: "Do you have a fever?" If so, ask: "What is your temperature, how was it measured, and when did it start?"     no 6. RESPIRATORY STATUS: "Describe your breathing?" (e.g., shortness of breath, wheezing, unable to speak)      no 7. BETTER-SAME-WORSE: "Are you getting better, staying the same or getting worse compared to yesterday?"  If getting worse, ask, "In what way?"     same 8. HIGH RISK DISEASE: "Do you have any chronic medical problems?" (e.g., asthma, heart or lung disease, weak immune system, etc.)     no 9. PREGNANCY: "Is there any chance you are pregnant?" "When was your last menstrual period?"     No  10. OTHER SYMPTOMS: "Do you have any other symptoms?"  (e.g., chills, fatigue, headache, loss of smell or  taste, muscle pain, sore throat)     no  Protocols used: CORONAVIRUS (COVID-19) DIAGNOSED OR SUSPECTED-A-AH

## 2019-06-24 NOTE — Telephone Encounter (Signed)
FYI, visit 7/15

## 2019-06-25 ENCOUNTER — Telehealth (INDEPENDENT_AMBULATORY_CARE_PROVIDER_SITE_OTHER): Payer: BLUE CROSS/BLUE SHIELD | Admitting: Family Medicine

## 2019-06-25 ENCOUNTER — Encounter: Payer: Self-pay | Admitting: Family Medicine

## 2019-06-25 VITALS — BP 98/85 | HR 65 | Ht 65.0 in | Wt 261.2 lb

## 2019-06-25 DIAGNOSIS — Z7689 Persons encountering health services in other specified circumstances: Secondary | ICD-10-CM | POA: Diagnosis not present

## 2019-06-25 DIAGNOSIS — J0100 Acute maxillary sinusitis, unspecified: Secondary | ICD-10-CM | POA: Diagnosis not present

## 2019-06-25 MED ORDER — METHYLPREDNISOLONE 4 MG PO TBPK: ORAL_TABLET | ORAL | 0 refills | Status: DC

## 2019-06-25 NOTE — Progress Notes (Signed)
Virtual Visit via Video Note  I connected with Nicole [name] on 06/25/19 at 10:30 AM EDT by a video enabled telemedicine application and verified that I am speaking with the correct person using two identifiers. Location patient: home Location provider: home office Persons participating in the virtual visit: patient, provider  I discussed the limitations of evaluation and management by telemedicine and the availability of in person appointments. The patient expressed understanding and agreed to proceed.  Chief Complaint  Patient presents with  . Establish Care    est care/ sneezing, drainage,runny nose(year around)/ wasn't sure if she should get tested for corona (pt been tested before negative)/pt takes care of mother/ 2 days/ nyquil and dayquil sinus     HPI: Nicole [name] is a 43 y.o. female to establish care with our office. Her previous PCP was Dr. Martinique Shirley.  Pt complains of a 2 day h/o sneezing and 1 week h/o runny nose, PND, nasal congestion and facial pressure.  She denies fever, chills, headache, sore throat, SOB, dry cough, GI symptoms, increased fatigue, no body aches. She has been taking dayquil and nyquil. She does take zyrtec daily x 3 years and uses flonase daily.  She has a h/o asthma but does not use any inhaler regularly.   She had  influenza B in 12/2018. She tested negative for COVID-19 about 2 mo ago.  Past Medical History:  Diagnosis Date  . Anemia   . Asthma   . Atypical chest pain    a. ?pericarditis 2015.  [place] Kitchen Blood transfusion 2011   r/t anemia  . Fibroid   . Migraine   . Seizures ([place])   . Sickle cell trait ([place])   . Stroke ([place])   . Tooth abscess     Past Surgical History:  Procedure Laterality Date  . TUBAL LIGATION Bilateral 2005    Family History  Problem Relation Age of Onset  . Hypertension Mother   . Diabetes Mother   . Cancer Mother        Colon, brain, 2 other  . Asthma Mother   . Clotting disorder Mother   . Heart disease  Mother        64, stents  . Stroke Mother        2012  . Diabetes Father   . Asthma Father   . Clotting disorder Father   . Sickle cell anemia Father   . Heart disease Father        2000, stents  . Hypertension Father   . Stroke Father        2007  . Diabetes Maternal Grandmother   . Asthma Sister   . Asthma Brother   . Asthma Paternal Grandmother   . Anesthesia problems Neg Hx     Social History   Tobacco Use  . Smoking status: Former Smoker    Quit date: 03/27/2014    Years since quitting: 5.2  . Smokeless tobacco: Never Used  Substance Use Topics  . Alcohol use: No  . Drug use: No     Current Outpatient Medications:  .  acetaminophen (TYLENOL) 325 MG tablet, Take 650 mg by mouth every 6 (six) hours as needed for moderate pain or headache., Disp: , Rfl:  .  ALAYCEN 1/35 tablet, TAKE 1 TABLET BY MOUTH EVERY DAY, Disp: 28 tablet, Rfl: 2 .  cetirizine (ZYRTEC) 10 MG tablet, Take 10 mg by mouth daily., Disp: , Rfl:  .  EPINEPHrine (EPIPEN 2-PAK) 0.3 mg/0.3 mL IJ SOAJ  injection, Inject 0.3 mg into the muscle as needed (for allergic reaction)., Disp: , Rfl:  .  Fe Cbn-Fe Gluc-FA-B12-C-DSS (FERRALET 90) 90-1 MG TABS, Take 1 tablet by mouth daily before breakfast., Disp: 30 each, Rfl: 11 .  ferrous sulfate 325 (65 FE) MG tablet, Take 1 tablet (325 mg total) by mouth daily., Disp: 30 tablet, Rfl: 0 .  levETIRAcetam (KEPPRA) 1000 MG tablet, Take 1 tablet (1,000 mg total) by mouth 2 (two) times daily., Disp: 60 tablet, Rfl: 0 .  medroxyPROGESTERone (DEPO-PROVERA) 150 MG/ML injection, Inject 1 mL (150 mg total) into the muscle every 3 (three) months., Disp: 1 mL, Rfl: 4 .  metroNIDAZOLE (FLAGYL) 500 MG tablet, Take 1 tablet (500 mg total) by mouth 2 (two) times daily., Disp: 14 tablet, Rfl: 2 .  naproxen (NAPROSYN) 500 MG tablet, Take 1 tablet (500 mg total) by mouth 2 (two) times daily with a meal., Disp: 60 tablet, Rfl: 5 .  prenatal vitamin w/FE, FA (PRENATAL 1 + 1) 27-1 MG TABS  tablet, Take 1 tablet by mouth daily before breakfast., Disp: 30 each, Rfl: 11 .  topiramate (TOPAMAX) 25 MG tablet, Take 3 tablets (75 mg total) by mouth 2 (two) times daily., Disp: 180 tablet, Rfl: 0 .  methylPREDNISolone (MEDROL DOSEPAK) 4 MG TBPK tablet, Take as directed, Disp: 21 tablet, Rfl: 0  Allergies  Allergen Reactions  . Penicillins Anaphylaxis and Swelling    Has patient had a PCN reaction causing immediate rash, facial/tongue/throat swelling, SOB or lightheadedness with hypotension: Yes Has patient had a PCN reaction causing severe rash involving mucus membranes or skin necrosis: Yes Has patient had a PCN reaction that required hospitalization Yes- ed visit Has patient had a PCN reaction occurring within the last 10 years: No-childhood allergy If all of the above answers are "NO", then may proceed with Cephalosporin use.   . Shrimp [Shellfish Allergy] Anaphylaxis  . Sulfa Antibiotics Anaphylaxis, Swelling and Rash  . Tea Anaphylaxis    All teas      ROS: See pertinent positives and negatives per HPI.   EXAM:  VITALS per patient if applicable: BP 16/10   Pulse 65   Ht 5\' 5"  (1.651 m)   Wt 261 lb 3.2 oz (118.5 kg)   BMI 43.47 kg/m    GENERAL: alert, oriented, no acute distress  HEENT: atraumatic, conjunctiva clear, no obvious abnormalities on inspection of external nose and ears; she sounds congested  NECK: normal movements of the head and neck  LUNGS: on inspection no signs of respiratory distress, breathing rate appears normal, no obvious gross SOB, gasping or wheezing, no conversational dyspnea  CV: no obvious cyanosis  MS: moves all visible extremities without noticeable abnormality  PSYCH/NEURO: pleasant and cooperative, speech and thought processing grossly intact   ASSESSMENT AND PLAN: 1. Encounter to establish care with new doctor  2. Acute non-recurrent maxillary sinusitis - symptoms x 6 days - switch from zyrtec to claritin or allegra, as pt  has been on zyrtec x years - nasal saline spray TID and PRN - can add mucinex BID Rx: - methylPREDNISolone (MEDROL DOSEPAK) 4 MG TBPK tablet; Take as directed  Dispense: 21 tablet; Refill: 0 - no indication for abx at this time - work note written - f/u if symptoms worsen or do not improve in about 7 days    I discussed the assessment and treatment plan with the patient. The patient was provided an opportunity to ask questions and all were answered. The patient agreed  with the plan and demonstrated an understanding of the instructions.   The patient was advised to call back or seek an in-person evaluation if the symptoms worsen or if the condition fails to improve as anticipated.   Letta Median, DO

## 2019-06-30 ENCOUNTER — Encounter: Payer: Self-pay | Admitting: Family Medicine

## 2019-07-03 MED ORDER — MONTELUKAST SODIUM 10 MG PO TABS: 10.0000 mg | ORAL_TABLET | Freq: Every day | ORAL | 3 refills | Status: AC

## 2019-07-03 NOTE — Progress Notes (Signed)
Patient ID: Nicole [name], female   DOB: 1976/09/26, 43 y.o.   MRN: [id number] Patient seen and assessed by nursing staff during this encounter. I have reviewed the chart and agree with the documentation and plan.  Emeterio Reeve, MD 07/03/2019 11:13 AM

## 2019-07-16 ENCOUNTER — Encounter: Payer: Self-pay | Admitting: Family Medicine

## 2019-07-16 DIAGNOSIS — J0100 Acute maxillary sinusitis, unspecified: Secondary | ICD-10-CM

## 2019-07-16 MED ORDER — AZITHROMYCIN 250 MG PO TABS: ORAL_TABLET | ORAL | 0 refills | Status: DC

## 2019-08-07 ENCOUNTER — Other Ambulatory Visit: Payer: Self-pay | Admitting: Obstetrics

## 2019-08-07 DIAGNOSIS — N939 Abnormal uterine and vaginal bleeding, unspecified: Secondary | ICD-10-CM

## 2019-09-09 ENCOUNTER — Other Ambulatory Visit: Payer: Self-pay

## 2019-09-09 ENCOUNTER — Ambulatory Visit (HOSPITAL_COMMUNITY)
Admission: EM | Admit: 2019-09-09 | Discharge: 2019-09-09 | Disposition: A | Discharge status: Home / Self Care | Payer: BLUE CROSS/BLUE SHIELD | Attending: Emergency Medicine | Admitting: Emergency Medicine

## 2019-09-09 ENCOUNTER — Ambulatory Visit (INDEPENDENT_AMBULATORY_CARE_PROVIDER_SITE_OTHER): Payer: Self-pay

## 2019-09-09 ENCOUNTER — Encounter (HOSPITAL_COMMUNITY): Payer: Self-pay

## 2019-09-09 VITALS — BP 120/85 | HR 99 | Ht 65.0 in | Wt 275.0 lb

## 2019-09-09 DIAGNOSIS — M25561 Pain in right knee: Secondary | ICD-10-CM

## 2019-09-09 DIAGNOSIS — S8991XA Unspecified injury of right lower leg, initial encounter: Secondary | ICD-10-CM

## 2019-09-09 DIAGNOSIS — W19XXXA Unspecified fall, initial encounter: Secondary | ICD-10-CM

## 2019-09-09 DIAGNOSIS — Z3042 Encounter for surveillance of injectable contraceptive: Secondary | ICD-10-CM

## 2019-09-09 MED ORDER — KETOROLAC TROMETHAMINE 30 MG/ML IJ SOLN
INTRAMUSCULAR | Status: AC
  Filled 2019-09-09: qty 1

## 2019-09-09 MED ORDER — KETOROLAC TROMETHAMINE 30 MG/ML IJ SOLN
30.0000 mg | Freq: Once | INTRAMUSCULAR | Status: AC
  Administered 2019-09-09: 30 mg via INTRAMUSCULAR

## 2019-09-09 MED ORDER — MEDROXYPROGESTERONE ACETATE 150 MG/ML IM SUSP
150.0000 mg | Freq: Once | INTRAMUSCULAR | Status: AC
  Administered 2019-09-09: 11:00:00 150 mg via INTRAMUSCULAR

## 2019-09-09 NOTE — Progress Notes (Signed)
Presents for DEPO Injection, given in LUOQ, tolerated well.  Next DEPO 12/15-29/2020  Administrations This Visit    medroxyPROGESTERone (DEPO-PROVERA) injection 150 mg    Admin Date 09/09/2019 Action Given Dose 150 mg Route Intramuscular Administered By Tamela Oddi, RMA

## 2019-09-09 NOTE — ED Provider Notes (Signed)
[facility]    CSN: [id number] Arrival date & time: 09/09/19  1508      History   Chief Complaint Chief Complaint  Patient presents with  . Fall    HPI Nicole [name] is a 43 y.o. female.   Patient presents with right knee pain since falling 3 weeks ago.  She denies numbness, paresthesias, weakness.  She refuses x-ray and states she just wants pain medication.  She has been taking ibuprofen and naproxen without relief.  She states the pain is worse with weightbearing and improves with rest.  LMP: Irregular, on Depo shots.  The history is provided by the patient.    Past Medical History:  Diagnosis Date  . Anemia   . Asthma   . Atypical chest pain    a. ?pericarditis 2015.  Marland Kitchen Blood transfusion 2011   r/t anemia  . Fibroid   . Migraine   . Seizures ([place])   . Sickle cell trait ([place])   . Stroke ([place])   . Tooth abscess     Patient Active Problem List   Diagnosis Date Noted  . 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 Depression   . Generalized abdominal pain   . Fatigue 10/28/2015  . Adjustment disorder with depressed mood 10/21/2015  . Dizziness   . Weakness 11/18/2014  . OSA (obstructive sleep apnea) 10/21/2014  . Acute rhinosinusitis 10/09/2014  . Leg pain, lateral 09/04/2014  . Migraines 08/13/2014  . Seizure ([place]) 06/04/2014  . Leiomyoma of uterus, unspecified 02/11/2014  . GERD (gastroesophageal reflux disease) 01/28/2014  . Candidiasis of vulva and vagina 01/28/2014  . Unspecified symptom associated with female genital organs 01/28/2014  . Chest pain 01/20/2014  . Acute pericarditis, unspecified 01/20/2014    Past Surgical History:  Procedure Laterality Date  . TUBAL LIGATION Bilateral 2005    OB History    Gravida  3   Para  2   Term  1   Preterm  1   AB  1   Living  2     SAB  0   TAB  0   Ectopic  1   Multiple  0   Live Births  2            Home Medications    Prior to Admission medications   Medication Sig Start Date End  Date Taking? Authorizing Provider  acetaminophen (TYLENOL) 325 MG tablet Take 650 mg by mouth every 6 (six) hours as needed for moderate pain or headache.    [provider]  ALAYCEN 1/35 tablet TAKE 1 TABLET BY MOUTH EVERY DAY 08/07/19   Shelly Bombard, MD  azithromycin (ZITHROMAX) 250 MG tablet 2 tabs po x 1 day then 1 tab po daily 07/16/19   Cirigliano, Mary K, DO  cetirizine (ZYRTEC) 10 MG tablet Take 10 mg by mouth daily.    [provider]  EPINEPHrine (EPIPEN 2-PAK) 0.3 mg/0.3 mL IJ SOAJ injection Inject 0.3 mg into the muscle as needed (for allergic reaction).    [provider]  Fe Cbn-Fe Gluc-FA-B12-C-DSS (FERRALET 90) 90-1 MG TABS Take 1 tablet by mouth daily before breakfast. 01/08/19   Shelly Bombard, MD  ferrous sulfate 325 (65 FE) MG tablet Take 1 tablet (325 mg total) by mouth daily. 12/29/18   Petrucelli, Samantha R, PA-C  levETIRAcetam (KEPPRA) 1000 MG tablet Take 1 tablet (1,000 mg total) by mouth 2 (two) times daily. 07/07/18   Molpus, Jenny Reichmann, MD  medroxyPROGESTERone (DEPO-PROVERA) 150  MG/ML injection Inject 1 mL (150 mg total) into the muscle every 3 (three) months. 01/08/19   Shelly Bombard, MD  methylPREDNISolone (MEDROL DOSEPAK) 4 MG TBPK tablet Take as directed Patient not taking: Reported on 09/09/2019 06/25/19   Ronnald Nian, DO  metroNIDAZOLE (FLAGYL) 500 MG tablet Take 1 tablet (500 mg total) by mouth 2 (two) times daily. 01/24/19   Shelly Bombard, MD  montelukast (SINGULAIR) 10 MG tablet Take 1 tablet (10 mg total) by mouth at bedtime. 07/03/19   Cirigliano, Garvin Fila, DO  naproxen (NAPROSYN) 500 MG tablet Take 1 tablet (500 mg total) by mouth 2 (two) times daily with a meal. 02/17/19   Shelly Bombard, MD  prenatal vitamin w/FE, FA (PRENATAL 1 + 1) 27-1 MG TABS tablet Take 1 tablet by mouth daily before breakfast. 01/08/19   Shelly Bombard, MD  topiramate (TOPAMAX) 25 MG tablet Take 3 tablets (75 mg total) by mouth 2 (two) times daily.  07/07/18   Molpus, John, MD    Family History Family History  Problem Relation Age of Onset  . Hypertension Mother   . Diabetes Mother   . Cancer Mother        Colon, brain, 2 other  . Asthma Mother   . Clotting disorder Mother   . Heart disease Mother        27, stents  . Stroke Mother        2012  . Diabetes Father   . Asthma Father   . Clotting disorder Father   . Sickle cell anemia Father   . Heart disease Father        2000, stents  . Hypertension Father   . Stroke Father        2007  . Diabetes Maternal Grandmother   . Asthma Sister   . Asthma Brother   . Asthma Paternal Grandmother   . Anesthesia problems Neg Hx     Social History Social History   Tobacco Use  . Smoking status: Former Smoker    Quit date: 03/27/2014    Years since quitting: 5.4  . Smokeless tobacco: Never Used  Substance Use Topics  . Alcohol use: No  . Drug use: No     Allergies   Penicillins, Shrimp [shellfish allergy], Sulfa antibiotics, and Tea   Review of Systems Review of Systems  Constitutional: Negative for chills and fever.  HENT: Negative for ear pain and sore throat.   Eyes: Negative for pain and visual disturbance.  Respiratory: Negative for cough and shortness of breath.   Cardiovascular: Negative for chest pain and palpitations.  Gastrointestinal: Negative for abdominal pain and vomiting.  Genitourinary: Negative for dysuria and hematuria.  Musculoskeletal: Positive for arthralgias. Negative for back pain.  Skin: Negative for color change and rash.  Neurological: Negative for seizures, syncope, weakness and numbness.  All other systems reviewed and are negative.    Physical Exam Triage Vital Signs ED Triage Vitals  Enc Vitals Group     BP 09/09/19 1524 116/85     Pulse Rate 09/09/19 1524 85     Resp 09/09/19 1524 18     Temp 09/09/19 1524 98.8 F (37.1 C)     Temp Source 09/09/19 1524 Oral     SpO2 09/09/19 1524 100 %     Weight 09/09/19 1522 271 lb  (122.9 kg)     Height --      Head Circumference --      Peak Flow --  Pain Score 09/09/19 1521 8     Pain Loc --      Pain Edu? --      Excl. in [place]? --    No data found.  Updated Vital Signs BP 116/85 (BP Location: Right Arm)   Pulse 85   Temp 98.8 F (37.1 C) (Oral)   Resp 18   Wt 271 lb (122.9 kg)   LMP 09/09/2019   SpO2 100%   BMI 45.10 kg/m   Visual Acuity Right Eye Distance:   Left Eye Distance:   Bilateral Distance:    Right Eye Near:   Left Eye Near:    Bilateral Near:     Physical Exam Vitals signs and nursing note reviewed.  Constitutional:      General: She is not in acute distress.    Appearance: She is well-developed.  HENT:     Head: Normocephalic and atraumatic.  Eyes:     Conjunctiva/sclera: Conjunctivae normal.  Neck:     Musculoskeletal: Neck supple.  Cardiovascular:     Rate and Rhythm: Normal rate and regular rhythm.     Heart sounds: No murmur.  Pulmonary:     Effort: Pulmonary effort is normal. No respiratory distress.     Breath sounds: Normal breath sounds.  Abdominal:     Palpations: Abdomen is soft.     Tenderness: There is no abdominal tenderness.  Musculoskeletal: Normal range of motion.        General: Tenderness present. No swelling or deformity.     Comments: Generalized tenderness to palpation of right knee.   Skin:    General: Skin is warm and dry.     Capillary Refill: Capillary refill takes less than 2 seconds.     Findings: No bruising, erythema, lesion or rash.  Neurological:     General: No focal deficit present.     Mental Status: She is alert and oriented to person, place, and time.     Sensory: No sensory deficit.     Motor: No weakness.     Gait: Gait normal.      UC Treatments / Results  Labs (all labs ordered are listed, but only abnormal results are displayed) Labs Reviewed - No data to display  EKG   Radiology No results found.  Procedures Procedures (including critical care time)   Medications Ordered in UC Medications  ketorolac (TORADOL) 30 MG/ML injection 30 mg (has no administration in time range)    Initial Impression / Assessment and Plan / UC Course  I have reviewed the triage vital signs and the nursing notes.  Pertinent labs & imaging results that were available during my care of the patient were reviewed by me and considered in my medical decision making (see chart for details).     Right knee pain.  Patient refuses x-ray today; states she wants pain medication only.  Treating with Toradol injection.  Instructed patient to follow-up with orthopedics if her pain persists.  Patient agrees to plan of care.     Final Clinical Impressions(s) / UC Diagnoses   Final diagnoses:  Acute pain of right knee     Discharge Instructions     You were given an injection of pain medication called Toradol today.    Follow-up with orthopedics if your pain continues.        ED Prescriptions    None     I have reviewed the PDMP during this encounter.   Sharion Balloon, NP 09/09/19 [phone]

## 2019-09-09 NOTE — Discharge Instructions (Addendum)
You were given an injection of pain medication called Toradol today.    Follow-up with orthopedics if your pain continues.

## 2019-09-09 NOTE — ED Triage Notes (Signed)
Pt states she fell and hurt her right knee. Pt states she has had pain for 3 weeks now.

## 2019-09-10 ENCOUNTER — Other Ambulatory Visit: Payer: Self-pay | Admitting: Obstetrics

## 2019-09-10 DIAGNOSIS — N946 Dysmenorrhea, unspecified: Secondary | ICD-10-CM

## 2019-09-24 ENCOUNTER — Encounter: Payer: Self-pay | Admitting: Family Medicine

## 2019-10-17 ENCOUNTER — Other Ambulatory Visit: Payer: Self-pay

## 2019-10-17 ENCOUNTER — Emergency Department (HOSPITAL_COMMUNITY): Payer: BLUE CROSS/BLUE SHIELD

## 2019-10-17 DIAGNOSIS — R0789 Other chest pain: Secondary | ICD-10-CM | POA: Insufficient documentation

## 2019-10-17 DIAGNOSIS — Z87891 Personal history of nicotine dependence: Secondary | ICD-10-CM | POA: Diagnosis not present

## 2019-10-17 DIAGNOSIS — R079 Chest pain, unspecified: Secondary | ICD-10-CM | POA: Diagnosis present

## 2019-10-17 DIAGNOSIS — Z79899 Other long term (current) drug therapy: Secondary | ICD-10-CM | POA: Insufficient documentation

## 2019-10-17 DIAGNOSIS — J0100 Acute maxillary sinusitis, unspecified: Secondary | ICD-10-CM | POA: Insufficient documentation

## 2019-10-17 LAB — BASIC METABOLIC PANEL
Anion gap: 11 (ref 5–15)
BUN: 8 mg/dL (ref 6–20)
CO2: 20 mmol/L — ABNORMAL LOW (ref 22–32)
Calcium: 9 mg/dL (ref 8.9–10.3)
Chloride: 109 mmol/L (ref 98–111)
Creatinine, Ser: 0.84 mg/dL (ref 0.44–1.00)
GFR calc Af Amer: >60 mL/min (ref >60)
GFR calc non Af Amer: >60 mL/min (ref >60)
Glucose, Bld: 123 mg/dL — ABNORMAL HIGH (ref 70–99)
Potassium: 3.3 mmol/L — ABNORMAL LOW (ref 3.5–5.1)
Sodium: 140 mmol/L (ref 135–145)

## 2019-10-17 LAB — CBC
HCT: 36.8 % (ref 36.0–46.0)
Hemoglobin: 12.1 g/dL (ref 12.0–15.0)
MCH: 29.2 pg (ref 26.0–34.0)
MCHC: 32.9 g/dL (ref 30.0–36.0)
MCV: 88.9 fL (ref 80.0–100.0)
Platelets: 298 10*3/uL (ref 150–400)
RBC: 4.14 MIL/uL (ref 3.87–5.11)
RDW: 12.4 % (ref 11.5–15.5)
WBC: 7.5 10*3/uL (ref 4.0–10.5)
nRBC: 0 % (ref 0.0–0.2)

## 2019-10-17 LAB — TROPONIN I (HIGH SENSITIVITY): Troponin I (High Sensitivity): <2 ng/L (ref <18)

## 2019-10-17 LAB — POCT PREGNANCY, URINE: Preg Test, Ur: NEGATIVE

## 2019-10-17 NOTE — ED Triage Notes (Signed)
Patient complains of chest pain in her right anterior chest that started 2 days ago that radiates down to her R arm, pain is getting worse and unrelieved by aspirin. Denies and SOB, N/V/D, but does say her chest hurts worse when she coughs. Pain does not get better when she leans forward. Hx of pericarditis.

## 2019-10-18 ENCOUNTER — Emergency Department (HOSPITAL_COMMUNITY)
Admission: EM | Admit: 2019-10-18 | Discharge: 2019-10-18 | Disposition: A | Discharge status: Home / Self Care | Payer: BLUE CROSS/BLUE SHIELD | Attending: Emergency Medicine | Admitting: Emergency Medicine

## 2019-10-18 DIAGNOSIS — E876 Hypokalemia: Secondary | ICD-10-CM

## 2019-10-18 DIAGNOSIS — J0100 Acute maxillary sinusitis, unspecified: Secondary | ICD-10-CM

## 2019-10-18 DIAGNOSIS — R0789 Other chest pain: Secondary | ICD-10-CM

## 2019-10-18 MED ORDER — POTASSIUM CHLORIDE CRYS ER 20 MEQ PO TBCR
40.0000 meq | EXTENDED_RELEASE_TABLET | Freq: Once | ORAL | Status: AC
  Administered 2019-10-18: 40 meq via ORAL
  Filled 2019-10-18: qty 2

## 2019-10-18 MED ORDER — DOXYCYCLINE HYCLATE 100 MG PO CAPS: 100.0000 mg | ORAL_CAPSULE | Freq: Two times a day (BID) | ORAL | 0 refills | Status: DC

## 2019-10-18 MED ORDER — ACETAMINOPHEN 325 MG PO TABS
650.0000 mg | ORAL_TABLET | Freq: Once | ORAL | Status: AC
  Administered 2019-10-18: 650 mg via ORAL
  Filled 2019-10-18: qty 2

## 2019-10-18 NOTE — ED Provider Notes (Signed)
[facility] DEPT Provider Note   CSN: [id number] Arrival date & time: 10/17/19  2202     History   Chief Complaint Chief Complaint  Patient presents with  . Chest Pain    HPI Nicole [name] is a 43 y.o. female.     Patient c/o right chest discomfort for past 2 days. Symptoms gradual onset, at rest, constant, mild, non radiating, worse w palpation. No associated sob, nv, diaphoresis, or unusual fatigue/doe. Patient denies cough. No sore throat. No chest wall injury or strain. No pleuritic pain. Denies leg pain or swelling. No fever or chills. No known covid+ exposure. Pt also c/o sinus congestion/maxillary sinus pressure for past several days - states has tried otc meds for 3-4 days without relief of symptoms - request tx for sinus/sinusitis. Denies headache, no neck pain or stiffness. Pt denies leg pain or swelling. No recent trauma, immobility or surgery. Non smoker.   The history is provided by the patient.  Chest Pain Associated symptoms: no abdominal pain, no back pain, no cough, no fever, no headache, no nausea, no palpitations, no shortness of breath and no vomiting     Past Medical History:  Diagnosis Date  . Anemia   . Asthma   . Atypical chest pain    a. ?pericarditis 2015.  Marland Kitchen Blood transfusion 2011   r/t anemia  . Fibroid   . Migraine   . Seizures ([place])   . Sickle cell trait ([place])   . Stroke ([place])   . Tooth abscess     Patient Active Problem List   Diagnosis Date Noted  . 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 Depression   . Generalized abdominal pain   . Fatigue 10/28/2015  . Adjustment disorder with depressed mood 10/21/2015  . Dizziness   . Weakness 11/18/2014  . OSA (obstructive sleep apnea) 10/21/2014  . Acute rhinosinusitis 10/09/2014  . Leg pain, lateral 09/04/2014  . Migraines 08/13/2014  . Seizure ([place]) 06/04/2014  . Leiomyoma of uterus, unspecified 02/11/2014  . GERD (gastroesophageal reflux disease) 01/28/2014  .  Candidiasis of vulva and vagina 01/28/2014  . Unspecified symptom associated with female genital organs 01/28/2014  . Chest pain 01/20/2014  . Acute pericarditis, unspecified 01/20/2014    Past Surgical History:  Procedure Laterality Date  . TUBAL LIGATION Bilateral 2005     OB History    Gravida  3   Para  2   Term  1   Preterm  1   AB  1   Living  2     SAB  0   TAB  0   Ectopic  1   Multiple  0   Live Births  2            Home Medications    Prior to Admission medications   Medication Sig Start Date End Date Taking? Authorizing Provider  acetaminophen (TYLENOL) 325 MG tablet Take 650 mg by mouth every 6 (six) hours as needed for moderate pain or headache.    [provider]  ALAYCEN 1/35 tablet TAKE 1 TABLET BY MOUTH EVERY DAY 08/07/19   Shelly Bombard, MD  azithromycin (ZITHROMAX) 250 MG tablet 2 tabs po x 1 day then 1 tab po daily 07/16/19   Cirigliano, Mary K, DO  cetirizine (ZYRTEC) 10 MG tablet Take 10 mg by mouth daily.    [provider]  EPINEPHrine (EPIPEN 2-PAK) 0.3 mg/0.3 mL IJ SOAJ injection Inject 0.3 mg into the muscle as  needed (for allergic reaction).    [provider]  Fe Cbn-Fe Gluc-FA-B12-C-DSS (FERRALET 90) 90-1 MG TABS Take 1 tablet by mouth daily before breakfast. 01/08/19   Shelly Bombard, MD  ferrous sulfate 325 (65 FE) MG tablet Take 1 tablet (325 mg total) by mouth daily. 12/29/18   Petrucelli, Samantha R, PA-C  levETIRAcetam (KEPPRA) 1000 MG tablet Take 1 tablet (1,000 mg total) by mouth 2 (two) times daily. 07/07/18   Molpus, John, MD  medroxyPROGESTERone (DEPO-PROVERA) 150 MG/ML injection Inject 1 mL (150 mg total) into the muscle every 3 (three) months. 01/08/19   Shelly Bombard, MD  methylPREDNISolone (MEDROL DOSEPAK) 4 MG TBPK tablet Take as directed Patient not taking: Reported on 09/09/2019 06/25/19   Ronnald Nian, DO  metroNIDAZOLE (FLAGYL) 500 MG tablet Take 1 tablet (500 mg total) by  mouth 2 (two) times daily. 01/24/19   Shelly Bombard, MD  montelukast (SINGULAIR) 10 MG tablet Take 1 tablet (10 mg total) by mouth at bedtime. 07/03/19   Cirigliano, Mary K, DO  naproxen (NAPROSYN) 500 MG tablet TAKE 1 TABLET (500 MG TOTAL) BY MOUTH 2 (TWO) TIMES DAILY WITH A MEAL. 09/10/19   Shelly Bombard, MD  prenatal vitamin w/FE, FA (PRENATAL 1 + 1) 27-1 MG TABS tablet Take 1 tablet by mouth daily before breakfast. 01/08/19   Shelly Bombard, MD  topiramate (TOPAMAX) 25 MG tablet Take 3 tablets (75 mg total) by mouth 2 (two) times daily. 07/07/18   Molpus, John, MD    Family History Family History  Problem Relation Age of Onset  . Hypertension Mother   . Diabetes Mother   . Cancer Mother        Colon, brain, 2 other  . Asthma Mother   . Clotting disorder Mother   . Heart disease Mother        90, stents  . Stroke Mother        2012  . Diabetes Father   . Asthma Father   . Clotting disorder Father   . Sickle cell anemia Father   . Heart disease Father        2000, stents  . Hypertension Father   . Stroke Father        2007  . Diabetes Maternal Grandmother   . Asthma Sister   . Asthma Brother   . Asthma Paternal Grandmother   . Anesthesia problems Neg Hx     Social History Social History   Tobacco Use  . Smoking status: Former Smoker    Quit date: 03/27/2014    Years since quitting: 5.5  . Smokeless tobacco: Never Used  Substance Use Topics  . Alcohol use: No  . Drug use: No     Allergies   Penicillins, Shrimp [shellfish allergy], Sulfa antibiotics, and Tea   Review of Systems Review of Systems  Constitutional: Negative for fever.  HENT: Positive for congestion, rhinorrhea and sinus pain. Negative for sore throat.   Eyes: Negative for pain and redness.  Respiratory: Negative for cough and shortness of breath.   Cardiovascular: Positive for chest pain. Negative for palpitations and leg swelling.  Gastrointestinal: Negative for abdominal pain, nausea  and vomiting.  Genitourinary: Negative for flank pain.  Musculoskeletal: Negative for back pain, neck pain and neck stiffness.  Skin: Negative for rash.  Neurological: Negative for headaches.  Hematological: Does not bruise/bleed easily.  Psychiatric/Behavioral: Negative for confusion.     Physical Exam Updated Vital Signs BP 125/65 (BP  Location: Left Arm)   Pulse 87   Temp 98 F (36.7 C) (Oral)   Resp 18   Ht 1.651 m (5\' 5" )   Wt 124.7 kg   SpO2 100%   BMI 45.76 kg/m   Physical Exam Vitals signs and nursing note reviewed.  Constitutional:      Appearance: Normal appearance. She is well-developed.  HENT:     Head: Atraumatic.     Nose:     Comments: Nasal congestion/rhinorrhea. +max sinus congestion.     Mouth/Throat:     Mouth: Mucous membranes are moist.     Pharynx: No oropharyngeal exudate or posterior oropharyngeal erythema.  Eyes:     General: No scleral icterus.    Conjunctiva/sclera: Conjunctivae normal.  Neck:     Musculoskeletal: Normal range of motion and neck supple. No neck rigidity or muscular tenderness.     Trachea: No tracheal deviation.     Comments: No stiffness or rigidity.  Cardiovascular:     Rate and Rhythm: Normal rate and regular rhythm.     Pulses: Normal pulses.     Heart sounds: Normal heart sounds. No murmur. No friction rub. No gallop.   Pulmonary:     Effort: Pulmonary effort is normal. No respiratory distress.     Breath sounds: Normal breath sounds.     Comments: +chest wall tenderness, reproducing symptoms, no crepitus, no sts, no skin changes/lesions.  Chest:     Chest wall: Tenderness present.  Abdominal:     General: Bowel sounds are normal. There is no distension.     Palpations: Abdomen is soft.     Tenderness: There is no abdominal tenderness.  Genitourinary:    Comments: No cva tenderness.  Musculoskeletal:        General: No swelling or tenderness.     Right lower leg: No edema.     Left lower leg: No edema.  Skin:     General: Skin is warm and dry.     Findings: No rash.  Neurological:     Mental Status: She is alert.     Comments: Alert, speech normal. Steady gait.   Psychiatric:        Mood and Affect: Mood normal.      ED Treatments / Results  Labs (all labs ordered are listed, but only abnormal results are displayed) Results for orders placed or performed during the hospital encounter of [id number]  Basic metabolic panel  Result Value Ref Range   Sodium 140 135 - 145 mmol/L   Potassium 3.3 (L) 3.5 - 5.1 mmol/L   Chloride 109 98 - 111 mmol/L   CO2 20 (L) 22 - 32 mmol/L   Glucose, Bld 123 (H) 70 - 99 mg/dL   BUN 8 6 - 20 mg/dL   Creatinine, Ser 0.84 0.44 - 1.00 mg/dL   Calcium 9.0 8.9 - 10.3 mg/dL   GFR calc non Af Amer >60 >60 mL/min   GFR calc Af Amer >60 >60 mL/min   Anion gap 11 5 - 15  CBC  Result Value Ref Range   WBC 7.5 4.0 - 10.5 K/uL   RBC 4.14 3.87 - 5.11 MIL/uL   Hemoglobin 12.1 12.0 - 15.0 g/dL   HCT 36.8 36.0 - 46.0 %   MCV 88.9 80.0 - 100.0 fL   MCH 29.2 26.0 - 34.0 pg   MCHC 32.9 30.0 - 36.0 g/dL   RDW 12.4 11.5 - 15.5 %   Platelets 298 150 - 400 K/uL  nRBC 0.0 0.0 - 0.2 %  Pregnancy, urine POC  Result Value Ref Range   Preg Test, Ur NEGATIVE NEGATIVE  Troponin I (High Sensitivity)  Result Value Ref Range   Troponin I (High Sensitivity) <2 <18 ng/L   Dg Chest 2 View  Result Date: 10/17/2019 CLINICAL DATA:  Chest pain EXAM: CHEST - 2 VIEW COMPARISON:  May 17, 2019 FINDINGS: The heart size and mediastinal contours are within normal limits. Both lungs are clear. The visualized skeletal structures are unremarkable. IMPRESSION: No active cardiopulmonary disease. Electronically Signed   By: Constance Holster M.D.   On: 10/17/2019 22:43    EKG EKG Interpretation  Date/Time:  Friday October 17 2019 22:11:59 EST Ventricular Rate:  82 PR Interval:    QRS Duration: 82 QT Interval:  361 QTC Calculation: 422 R Axis:   43 Text Interpretation: Sinus rhythm No  significant change since last tracing Confirmed by Dorie Rank [phone]) on 10/17/2019 10:31:03 PM   Radiology Dg Chest 2 View  Result Date: 10/17/2019 CLINICAL DATA:  Chest pain EXAM: CHEST - 2 VIEW COMPARISON:  May 17, 2019 FINDINGS: The heart size and mediastinal contours are within normal limits. Both lungs are clear. The visualized skeletal structures are unremarkable. IMPRESSION: No active cardiopulmonary disease. Electronically Signed   By: Constance Holster M.D.   On: 10/17/2019 22:43    Procedures Procedures (including critical care time)  Medications Ordered in ED Medications  acetaminophen (TYLENOL) tablet 650 mg (has no administration in time range)  potassium chloride SA (KLOR-CON) CR tablet 40 mEq (has no administration in time range)     Initial Impression / Assessment and Plan / ED Course  I have reviewed the triage vital signs and the nursing notes.  Pertinent labs & imaging results that were available during my care of the patient were reviewed by me and considered in my medical decision making (see chart for details).  Labs sent. Ecg. Cxr.   Reviewed nursing notes and prior charts for additional history.   Labs reviewed/interpreted by me - k sl low. kcl po. After symptoms present/constant for 2 days, trop is normal - felt not c/w acs. pts exam most c/w sinusitis and chest wall pain/tenderness.   CXR reviewed/interpreted by me - no pna.   Patient currently appears stable for d/c.   Rec pcp f/u.  Return precautions provided.     Final Clinical Impressions(s) / ED Diagnoses   Final diagnoses:  None    ED Discharge Orders    None       Lajean Saver, MD 10/18/19 [phone]

## 2019-10-18 NOTE — Discharge Instructions (Addendum)
It was our pleasure to provide your ER care today - we hope that you feel better.  Take antibiotic as prescribed. You may try zyrtec-d or claritin-d as need for congestion.   Take acetaminophen or ibuprofen as need.   From today's labs, your potassium is mildly low (3.3) - eat plenty of fruits and vegetables, and follow up with primary care doctor in 1-2 weeks.   Return to ER if worse, new symptoms, severe pain, increased trouble breathing, recurrent or persistent chest pain, or other concern.

## 2019-10-21 ENCOUNTER — Encounter: Payer: Self-pay | Admitting: Family Medicine

## 2019-10-22 ENCOUNTER — Encounter: Payer: Self-pay | Admitting: Family Medicine

## 2019-11-13 ENCOUNTER — Ambulatory Visit
Admission: EM | Admit: 2019-11-13 | Discharge: 2019-11-13 | Disposition: A | Discharge status: Home / Self Care | Payer: BLUE CROSS/BLUE SHIELD | Attending: Physician Assistant | Admitting: Physician Assistant

## 2019-11-13 ENCOUNTER — Other Ambulatory Visit: Payer: Self-pay

## 2019-11-13 DIAGNOSIS — R05 Cough: Secondary | ICD-10-CM

## 2019-11-13 DIAGNOSIS — Z20828 Contact with and (suspected) exposure to other viral communicable diseases: Secondary | ICD-10-CM

## 2019-11-13 DIAGNOSIS — J069 Acute upper respiratory infection, unspecified: Secondary | ICD-10-CM | POA: Diagnosis not present

## 2019-11-13 DIAGNOSIS — R1084 Generalized abdominal pain: Secondary | ICD-10-CM | POA: Diagnosis not present

## 2019-11-13 DIAGNOSIS — R059 Cough, unspecified: Secondary | ICD-10-CM

## 2019-11-13 MED ORDER — IPRATROPIUM BROMIDE 0.06 % NA SOLN: 2.0000 | Freq: Four times a day (QID) | NASAL | 0 refills | Status: DC

## 2019-11-13 MED ORDER — PREDNISONE 50 MG PO TABS: 50.0000 mg | ORAL_TABLET | Freq: Every day | ORAL | 0 refills | Status: DC

## 2019-11-13 MED ORDER — FAMOTIDINE 20 MG PO TABS: 20.0000 mg | ORAL_TABLET | Freq: Every day | ORAL | 0 refills | Status: DC

## 2019-11-13 NOTE — Discharge Instructions (Signed)
COVID testing ordered. I would like you to quarantine until testing results. Prednisone and atrovent nasal spray for sinus pressure/drainage. Pepcid for possible acid reflux causing symptoms. You can take over the counter flonase/nasacort to help with nasal congestion/drainage. If experiencing shortness of breath, trouble breathing, go to the emergency department for further evaluation needed.

## 2019-11-13 NOTE — ED Triage Notes (Signed)
Pt c/o sinus headache and congestion x3 days

## 2019-11-13 NOTE — ED Provider Notes (Signed)
[facility]    CSN: [id number] Arrival date & time: 11/13/19  1013      History   Chief Complaint Chief Complaint  Patient presents with  . Headache    HPI Nicole [name] is a 43 y.o. female.   43 year old female comes in for 3 days of URI symptoms. Has had sinus pressure, nasal congestion, sore throat, post nasal drip. Denies fever, chills, body aches. Denies shortness of breath, loss of taste/smell.  Denies nausea, vomiting, diarrhea.  She has had 2-week history of low abdominal pain that is burning in sensation, worse with eating.  Has had constipation, with hard small stools.  Denies urinary symptoms such as frequency, dysuria, hematuria.  Denies vaginal symptoms such as discharge, itching, pain.  History of tubal ligation.  Depot injections.     Past Medical History:  Diagnosis Date  . Anemia   . Asthma   . Atypical chest pain    a. ?pericarditis 2015.  Marland Kitchen Blood transfusion 2011   r/t anemia  . Fibroid   . Migraine   . Seizures ([place])   . Sickle cell trait ([place])   . Stroke ([place])   . Tooth abscess     Patient Active Problem List   Diagnosis Date Noted  . 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 Depression   . Generalized abdominal pain   . Fatigue 10/28/2015  . Adjustment disorder with depressed mood 10/21/2015  . Dizziness   . Weakness 11/18/2014  . OSA (obstructive sleep apnea) 10/21/2014  . Acute rhinosinusitis 10/09/2014  . Leg pain, lateral 09/04/2014  . Migraines 08/13/2014  . Seizure ([place]) 06/04/2014  . Leiomyoma of uterus, unspecified 02/11/2014  . GERD (gastroesophageal reflux disease) 01/28/2014  . Candidiasis of vulva and vagina 01/28/2014  . Unspecified symptom associated with female genital organs 01/28/2014  . Chest pain 01/20/2014  . Acute pericarditis, unspecified 01/20/2014    Past Surgical History:  Procedure Laterality Date  . TUBAL LIGATION Bilateral 2005    OB History    Gravida  3   Para  2   Term  1   Preterm  1   AB  1    Living  2     SAB  0   TAB  0   Ectopic  1   Multiple  0   Live Births  2            Home Medications    Prior to Admission medications   Medication Sig Start Date End Date Taking? Authorizing Provider  acetaminophen (TYLENOL) 325 MG tablet Take 650 mg by mouth every 6 (six) hours as needed for moderate pain or headache.    [provider]  ALAYCEN 1/35 tablet TAKE 1 TABLET BY MOUTH EVERY DAY 08/07/19   Shelly Bombard, MD  cetirizine (ZYRTEC) 10 MG tablet Take 10 mg by mouth daily.    [provider]  doxycycline (VIBRAMYCIN) 100 MG capsule Take 1 capsule (100 mg total) by mouth 2 (two) times daily. 10/18/19   Lajean Saver, MD  EPINEPHrine (EPIPEN 2-PAK) 0.3 mg/0.3 mL IJ SOAJ injection Inject 0.3 mg into the muscle as needed (for allergic reaction).    [provider]  famotidine (PEPCID) 20 MG tablet Take 1 tablet (20 mg total) by mouth daily. 11/13/19   Tasia Catchings, Rubby Barbary V, PA-C  Fe Cbn-Fe Gluc-FA-B12-C-DSS (FERRALET 90) 90-1 MG TABS Take 1 tablet by mouth daily before breakfast. 01/08/19   Shelly Bombard, MD  ferrous  sulfate 325 (65 FE) MG tablet Take 1 tablet (325 mg total) by mouth daily. 12/29/18   Petrucelli, Samantha R, PA-C  ipratropium (ATROVENT) 0.06 % nasal spray Place 2 sprays into both nostrils 4 (four) times daily. 11/13/19   Tasia Catchings, Krystena Reitter V, PA-C  levETIRAcetam (KEPPRA) 1000 MG tablet Take 1 tablet (1,000 mg total) by mouth 2 (two) times daily. 07/07/18   Molpus, John, MD  medroxyPROGESTERone (DEPO-PROVERA) 150 MG/ML injection Inject 1 mL (150 mg total) into the muscle every 3 (three) months. 01/08/19   Shelly Bombard, MD  montelukast (SINGULAIR) 10 MG tablet Take 1 tablet (10 mg total) by mouth at bedtime. 07/03/19   Cirigliano, Mary K, DO  naproxen (NAPROSYN) 500 MG tablet TAKE 1 TABLET (500 MG TOTAL) BY MOUTH 2 (TWO) TIMES DAILY WITH A MEAL. 09/10/19   Shelly Bombard, MD  predniSONE (DELTASONE) 50 MG tablet Take 1 tablet (50 mg total) by mouth  daily with breakfast. 11/13/19   Tasia Catchings, Kaisley Stiverson V, PA-C  prenatal vitamin w/FE, FA (PRENATAL 1 + 1) 27-1 MG TABS tablet Take 1 tablet by mouth daily before breakfast. 01/08/19   Shelly Bombard, MD  topiramate (TOPAMAX) 25 MG tablet Take 3 tablets (75 mg total) by mouth 2 (two) times daily. 07/07/18   Molpus, John, MD    Family History Family History  Problem Relation Age of Onset  . Hypertension Mother   . Diabetes Mother   . Cancer Mother        Colon, brain, 2 other  . Asthma Mother   . Clotting disorder Mother   . Heart disease Mother        4, stents  . Stroke Mother        2012  . Diabetes Father   . Asthma Father   . Clotting disorder Father   . Sickle cell anemia Father   . Heart disease Father        2000, stents  . Hypertension Father   . Stroke Father        2007  . Diabetes Maternal Grandmother   . Asthma Sister   . Asthma Brother   . Asthma Paternal Grandmother   . Anesthesia problems Neg Hx     Social History Social History   Tobacco Use  . Smoking status: Former Smoker    Quit date: 03/27/2014    Years since quitting: 5.6  . Smokeless tobacco: Never Used  Substance Use Topics  . Alcohol use: No  . Drug use: No     Allergies   Penicillins, Shrimp [shellfish allergy], Sulfa antibiotics, and Tea   Review of Systems Review of Systems  Reason unable to perform ROS: See HPI as above.     Physical Exam Triage Vital Signs ED Triage Vitals [11/13/19 1146]  Enc Vitals Group     BP 116/84     Pulse Rate 71     Resp 18     Temp 98.2 F (36.8 C)     Temp Source Oral     SpO2 96 %     Weight      Height      Head Circumference      Peak Flow      Pain Score 8     Pain Loc      Pain Edu?      Excl. in [place]?    No data found.  Updated Vital Signs BP 116/84 (BP Location: Right Arm)   Pulse 71  Temp 98.2 F (36.8 C) (Oral)   Resp 18   SpO2 96%   Visual Acuity Right Eye Distance:   Left Eye Distance:   Bilateral Distance:    Right Eye  Near:   Left Eye Near:    Bilateral Near:     Physical Exam Constitutional:      General: She is not in acute distress.    Appearance: Normal appearance. She is well-developed. She is not ill-appearing, toxic-appearing or diaphoretic.  HENT:     Head: Normocephalic and atraumatic.     Nose:     Right Sinus: Maxillary sinus tenderness and frontal sinus tenderness present.     Left Sinus: Maxillary sinus tenderness and frontal sinus tenderness present.     Mouth/Throat:     Mouth: Mucous membranes are moist.     Pharynx: Oropharynx is clear. Uvula midline.  Eyes:     Conjunctiva/sclera: Conjunctivae normal.     Pupils: Pupils are equal, round, and reactive to light.  Neck:     Musculoskeletal: Normal range of motion and neck supple.  Cardiovascular:     Rate and Rhythm: Normal rate and regular rhythm.     Heart sounds: Normal heart sounds. No murmur. No friction rub. No gallop.   Pulmonary:     Effort: Pulmonary effort is normal. No accessory muscle usage, prolonged expiration, respiratory distress or retractions.     Breath sounds: Normal breath sounds. No wheezing or rales.     Comments: Lungs clear to auscultation without adventitious lung sounds. Abdominal:     General: Bowel sounds are normal.     Palpations: Abdomen is soft.     Tenderness: There is generalized abdominal tenderness. There is no right CVA tenderness, left CVA tenderness, guarding or rebound.  Skin:    General: Skin is warm and dry.  Neurological:     General: No focal deficit present.     Mental Status: She is alert and oriented to person, place, and time.  Psychiatric:        Behavior: Behavior normal.        Judgment: Judgment normal.      UC Treatments / Results  Labs (all labs ordered are listed, but only abnormal results are displayed) Labs Reviewed  NOVEL CORONAVIRUS, NAA    EKG   Radiology No results found.  Procedures Procedures (including critical care time)  Medications Ordered  in UC Medications - No data to display  Initial Impression / Assessment and Plan / UC Course  I have reviewed the triage vital signs and the nursing notes.  Pertinent labs & imaging results that were available during my care of the patient were reviewed by me and considered in my medical decision making (see chart for details).    No alarming signs on exam.  Covid testing ordered.  Patient to quarantine until testing results return.  Prednisone, Atrovent for sinus pressure.  Discussed possible acid reflux/constipation causing abdominal pain.  To push fluids and start Pepcid/MiraLAX for symptomatic management.  Return precautions given.  Patient expresses understanding and agrees to plan.  Final Clinical Impressions(s) / UC Diagnoses   Final diagnoses:  Cough  Viral URI  Generalized abdominal pain   ED Prescriptions    Medication Sig Dispense Auth. Provider   predniSONE (DELTASONE) 50 MG tablet Take 1 tablet (50 mg total) by mouth daily with breakfast. 5 tablet Carlis Burnsworth V, PA-C   ipratropium (ATROVENT) 0.06 % nasal spray Place 2 sprays into both nostrils 4 (four)  times daily. 15 mL Chrystina Naff V, PA-C   famotidine (PEPCID) 20 MG tablet Take 1 tablet (20 mg total) by mouth daily. 15 tablet Ok Edwards, PA-C     PDMP not reviewed this encounter.   Ok Edwards, PA-C 11/13/19 1404

## 2019-11-17 LAB — NOVEL CORONAVIRUS, NAA: SARS-CoV-2, NAA: NOT DETECTED

## 2019-11-25 ENCOUNTER — Encounter (HOSPITAL_COMMUNITY): Payer: Self-pay

## 2019-11-25 ENCOUNTER — Telehealth (INDEPENDENT_AMBULATORY_CARE_PROVIDER_SITE_OTHER): Payer: BLUE CROSS/BLUE SHIELD | Admitting: Family Medicine

## 2019-11-25 ENCOUNTER — Encounter: Payer: Self-pay | Admitting: Family Medicine

## 2019-11-25 ENCOUNTER — Telehealth: Payer: BLUE CROSS/BLUE SHIELD

## 2019-11-25 DIAGNOSIS — J019 Acute sinusitis, unspecified: Secondary | ICD-10-CM

## 2019-11-25 MED ORDER — DOXYCYCLINE HYCLATE 100 MG PO TABS: 100.0000 mg | ORAL_TABLET | Freq: Two times a day (BID) | ORAL | 0 refills | Status: DC

## 2019-11-25 NOTE — Assessment & Plan Note (Signed)
URI symptoms initially, some impvrovement now with worsening.  COVID test negative on 12/2 when initial onset of symptoms.  Current symptoms consistent with sinusitis, will cover with course of doxycycline since PCN and sulfa allergic.  She should increase fluid intake and rest.  She may continue OTC mucinex prn.   She is instructed to call for any new or worsening symptoms. She expresses understanding.

## 2019-11-25 NOTE — Progress Notes (Signed)
Nicole [name] - 43 y.o. female MRN [id number]  Date of birth: 05/02/1976   This visit type was conducted due to national recommendations for restrictions regarding the COVID-19 Pandemic (e.g. social distancing).  This format is felt to be most appropriate for this patient at this time.  All issues noted in this document were discussed and addressed.  No physical exam was performed (except for noted visual exam findings with Video Visits).  I discussed the limitations of evaluation and management by telemedicine and the availability of in person appointments. The patient expressed understanding and agreed to proceed.  I connected with@ on 11/25/19 at 11:20 AM EST by a video enabled telemedicine application and verified that I am speaking with the correct person using two identifiers.  Present at visit: Luetta Nutting, DO [name]   Patient Location: [place] [address]   Provider location:   [place]  Chief Complaint  Patient presents with  . Sore Throat    Pt has a sore and dry throat. low grade fever and nose just started to run a little.    HPI  Nicole [name] is a 43 y.o. female who presents via audio/video conferencing for a telehealth visit today.  She has complaint of sore throat, sinus pain with thick, yellow mucus, mild headache and elevated temp to 99.75F.  She reports that she had runny nose and congestion starting around 2 weeks ago.  She found out she had been exposed to COVID-19 at work and had test completed which returned negative.  Symptoms were improving and then this week worsened with increased sinus pain.  She denies cough, shortness of breath, nausea, vomiting, diarrhea. She is using OTC mucinex-D with mild relief.     ROS:  A comprehensive ROS was completed and negative except as noted per HPI  Past Medical History:  Diagnosis Date  . Anemia   . Asthma   . Atypical chest pain    a. ?pericarditis 2015.  Marland Kitchen Blood transfusion  2011   r/t anemia  . Fibroid   . Migraine   . Seizures ([place])   . Sickle cell trait ([place])   . Stroke ([place])   . Tooth abscess     Past Surgical History:  Procedure Laterality Date  . TUBAL LIGATION Bilateral 2005    Family History  Problem Relation Age of Onset  . Hypertension Mother   . Diabetes Mother   . Cancer Mother        Colon, brain, 2 other  . Asthma Mother   . Clotting disorder Mother   . Heart disease Mother        58, stents  . Stroke Mother        2012  . Diabetes Father   . Asthma Father   . Clotting disorder Father   . Sickle cell anemia Father   . Heart disease Father        2000, stents  . Hypertension Father   . Stroke Father        2007  . Diabetes Maternal Grandmother   . Asthma Sister   . Asthma Brother   . Asthma Paternal Grandmother   . Anesthesia problems Neg Hx     Social History   Socioeconomic History  . Marital status: Legally Separated    Spouse name: Not on file  . Number of children: Not on file  . Years of education: Not on file  . Highest education level: Not on file  Occupational History  . Not on file  Tobacco Use  . Smoking status: Former Smoker    Quit date: 03/27/2014    Years since quitting: 5.6  . Smokeless tobacco: Never Used  Substance and Sexual Activity  . Alcohol use: No  . Drug use: No  . Sexual activity: Yes    Partners: Male    Birth control/protection: Condom, Injection, Surgical  Other Topics Concern  . Not on file  Social History Narrative  . Not on file   Social Determinants of Health   Financial Resource Strain:   . Difficulty of Paying Living Expenses: Not on file  Food Insecurity:   . Worried About Charity fundraiser in the Last Year: Not on file  . Ran Out of Food in the Last Year: Not on file  Transportation Needs:   . Lack of Transportation (Medical): Not on file  . Lack of Transportation (Non-Medical): Not on file  Physical Activity:   . Days of Exercise per Week: Not on file  .  Minutes of Exercise per Session: Not on file  Stress:   . Feeling of Stress : Not on file  Social Connections:   . Frequency of Communication with Friends and Family: Not on file  . Frequency of Social Gatherings with Friends and Family: Not on file  . Attends Religious Services: Not on file  . Active Member of Clubs or Organizations: Not on file  . Attends Archivist Meetings: Not on file  . Marital Status: Not on file  Intimate Partner Violence:   . Fear of Current or Ex-Partner: Not on file  . Emotionally Abused: Not on file  . Physically Abused: Not on file  . Sexually Abused: Not on file     Current Outpatient Medications:  .  acetaminophen (TYLENOL) 325 MG tablet, Take 650 mg by mouth every 6 (six) hours as needed for moderate pain or headache., Disp: , Rfl:  .  ALAYCEN 1/35 tablet, TAKE 1 TABLET BY MOUTH EVERY DAY, Disp: 28 tablet, Rfl: 2 .  cetirizine (ZYRTEC) 10 MG tablet, Take 10 mg by mouth daily., Disp: , Rfl:  .  EPINEPHrine (EPIPEN 2-PAK) 0.3 mg/0.3 mL IJ SOAJ injection, Inject 0.3 mg into the muscle as needed (for allergic reaction)., Disp: , Rfl:  .  famotidine (PEPCID) 20 MG tablet, Take 1 tablet (20 mg total) by mouth daily., Disp: 15 tablet, Rfl: 0 .  Fe Cbn-Fe Gluc-FA-B12-C-DSS (FERRALET 90) 90-1 MG TABS, Take 1 tablet by mouth daily before breakfast., Disp: 30 each, Rfl: 11 .  ferrous sulfate 325 (65 FE) MG tablet, Take 1 tablet (325 mg total) by mouth daily., Disp: 30 tablet, Rfl: 0 .  ipratropium (ATROVENT) 0.06 % nasal spray, Place 2 sprays into both nostrils 4 (four) times daily., Disp: 15 mL, Rfl: 0 .  levETIRAcetam (KEPPRA) 1000 MG tablet, Take 1 tablet (1,000 mg total) by mouth 2 (two) times daily., Disp: 60 tablet, Rfl: 0 .  medroxyPROGESTERone (DEPO-PROVERA) 150 MG/ML injection, Inject 1 mL (150 mg total) into the muscle every 3 (three) months., Disp: 1 mL, Rfl: 4 .  montelukast (SINGULAIR) 10 MG tablet, Take 1 tablet (10 mg total) by mouth at  bedtime., Disp: 90 tablet, Rfl: 3 .  naproxen (NAPROSYN) 500 MG tablet, TAKE 1 TABLET (500 MG TOTAL) BY MOUTH 2 (TWO) TIMES DAILY WITH A MEAL., Disp: 60 tablet, Rfl: 5 .  prenatal vitamin w/FE, FA (PRENATAL 1 + 1) 27-1 MG TABS tablet, Take 1  tablet by mouth daily before breakfast., Disp: 30 each, Rfl: 11 .  topiramate (TOPAMAX) 25 MG tablet, Take 3 tablets (75 mg total) by mouth 2 (two) times daily., Disp: 180 tablet, Rfl: 0 .  doxycycline (VIBRA-TABS) 100 MG tablet, Take 1 tablet (100 mg total) by mouth 2 (two) times daily., Disp: 20 tablet, Rfl: 0 .  predniSONE (DELTASONE) 50 MG tablet, Take 1 tablet (50 mg total) by mouth daily with breakfast. (Patient not taking: Reported on 11/25/2019), Disp: 5 tablet, Rfl: 0  EXAM:  VITALS per patient if applicable: Temp [id number] F (37.7 C) (Oral) Comment: per pt. Comment (Src): per pt  Ht 5\' 5"  (1.651 m)   BMI 45.76 kg/m   GENERAL: alert, oriented, appears well.  She sounds very congested.   HEENT: atraumatic, conjunttiva clear, no obvious abnormalities on inspection of external nose and ears  NECK: normal movements of the head and neck  LUNGS: on inspection no signs of respiratory distress, breathing rate appears normal, no obvious gross SOB, gasping or wheezing  CV: no obvious cyanosis  MS: moves all visible extremities without noticeable abnormality  PSYCH/NEURO: pleasant and cooperative, no obvious depression or anxiety, speech and thought processing grossly intact  ASSESSMENT AND PLAN:  Discussed the following assessment and plan:  Acute rhinosinusitis URI symptoms initially, some impvrovement now with worsening.  COVID test negative on 12/2 when initial onset of symptoms.  Current symptoms consistent with sinusitis, will cover with course of doxycycline since PCN and sulfa allergic.  She should increase fluid intake and rest.  She may continue OTC mucinex prn.   She is instructed to call for any new or worsening symptoms. She expresses  understanding.      I discussed the assessment and treatment plan with the patient. The patient was provided an opportunity to ask questions and all were answered. The patient agreed with the plan and demonstrated an understanding of the instructions.   The patient was advised to call back or seek an in-person evaluation if the symptoms worsen or if the condition fails to improve as anticipated.    Luetta Nutting, DO

## 2019-11-26 ENCOUNTER — Encounter: Payer: Self-pay | Admitting: Family Medicine

## 2019-12-02 ENCOUNTER — Ambulatory Visit: Payer: BLUE CROSS/BLUE SHIELD

## 2019-12-15 ENCOUNTER — Ambulatory Visit
Admission: EM | Admit: 2019-12-15 | Discharge: 2019-12-15 | Disposition: A | Discharge status: Home / Self Care | Payer: BLUE CROSS/BLUE SHIELD | Attending: Physician Assistant | Admitting: Physician Assistant

## 2019-12-15 DIAGNOSIS — J3489 Other specified disorders of nose and nasal sinuses: Secondary | ICD-10-CM | POA: Diagnosis not present

## 2019-12-15 DIAGNOSIS — R42 Dizziness and giddiness: Secondary | ICD-10-CM | POA: Diagnosis not present

## 2019-12-15 MED ORDER — LEVETIRACETAM 1000 MG PO TABS: 1000.0000 mg | ORAL_TABLET | Freq: Two times a day (BID) | ORAL | 0 refills | Status: DC

## 2019-12-15 MED ORDER — MECLIZINE HCL 25 MG PO TABS: 25.0000 mg | ORAL_TABLET | Freq: Three times a day (TID) | ORAL | 0 refills | Status: DC | PRN

## 2019-12-15 MED ORDER — DEXAMETHASONE SODIUM PHOSPHATE 10 MG/ML IJ SOLN
10.0000 mg | Freq: Once | INTRAMUSCULAR | Status: AC
  Administered 2019-12-15: 10 mg via INTRAMUSCULAR

## 2019-12-15 MED ORDER — KETOROLAC TROMETHAMINE 30 MG/ML IJ SOLN
30.0000 mg | Freq: Once | INTRAMUSCULAR | Status: AC
  Administered 2019-12-15: 17:00:00 30 mg via INTRAMUSCULAR

## 2019-12-15 NOTE — Discharge Instructions (Signed)
No alarming signs on exam.  Decadron, Toradol injection given in office today.  You can take meclizine as needed for dizziness.  I have refilled your Keppra for 10 days.  Follow-up with PCP/neurology for further refills. Keep hydrated, urine should be clear to pale yellow in color.  If worsening symptoms, passing out, having seizures, go to the emergency department for further evaluation.  Otherwise, follow-up with ENT/neurology for further evaluation and management needed.

## 2019-12-15 NOTE — ED Triage Notes (Signed)
Pt c/o lightheadedness and dizziness off and on since this am. State has been out of her keppra x2 days. States "my daughter said I had a seizure last night". States she gets like this before a seizure. Call her PCP for a refill and hasn't received it yet.

## 2019-12-15 NOTE — ED Provider Notes (Signed)
[facility]    CSN: [id number] Arrival date & time: 12/15/19  1545      History   Chief Complaint Chief Complaint  Patient presents with  . Dizziness    HPI Nicole [name] is a 44 y.o. female.   44 year old female with history of migraine, seizures, CVA, anemia comes in for intermittent lightheadedness and dizziness this morning.  States symptoms are induced by positional changes, specifically bending over.  Has been having photophobia, without vision changes.  She has had nasal congestion, sinus pressure for the past month with negative Covid test.  Was given antibiotics few weeks ago without much relief.  Denies any fever, chills, body aches.  Denies one-sided weakness, confusion, head injury, loss of consciousness.  Denies source of bleeding such as vaginal bleeding, melena, hematochezia, hematuria. States her daughter thinks she may have had a seizure last night while she was asleep, but states she usually wakes up with stiffness when she has seizures and did not experience this.  States she ran out of Keppra 2 days ago, and has received refills yet.     Past Medical History:  Diagnosis Date  . Anemia   . Asthma   . Atypical chest pain    a. ?pericarditis 2015.  Marland Kitchen Blood transfusion 2011   r/t anemia  . Fibroid   . Migraine   . Seizures ([place])   . Sickle cell trait ([place])   . Stroke ([place])   . Tooth abscess     Patient Active Problem List   Diagnosis Date Noted  . 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 Depression   . Generalized abdominal pain   . Fatigue 10/28/2015  . Adjustment disorder with depressed mood 10/21/2015  . Dizziness   . Weakness 11/18/2014  . OSA (obstructive sleep apnea) 10/21/2014  . Acute rhinosinusitis 10/09/2014  . Leg pain, lateral 09/04/2014  . Migraines 08/13/2014  . Seizure ([place]) 06/04/2014  . Leiomyoma of uterus, unspecified 02/11/2014  . GERD (gastroesophageal reflux disease) 01/28/2014  . Candidiasis of vulva and vagina 01/28/2014  .  Unspecified symptom associated with female genital organs 01/28/2014  . Chest pain 01/20/2014  . Acute pericarditis, unspecified 01/20/2014    Past Surgical History:  Procedure Laterality Date  . TUBAL LIGATION Bilateral 2005    OB History    Gravida  3   Para  2   Term  1   Preterm  1   AB  1   Living  2     SAB  0   TAB  0   Ectopic  1   Multiple  0   Live Births  2            Home Medications    Prior to Admission medications   Medication Sig Start Date End Date Taking? Authorizing Provider  acetaminophen (TYLENOL) 325 MG tablet Take 650 mg by mouth every 6 (six) hours as needed for moderate pain or headache.    [provider]  ALAYCEN 1/35 tablet TAKE 1 TABLET BY MOUTH EVERY DAY 08/07/19   Shelly Bombard, MD  cetirizine (ZYRTEC) 10 MG tablet Take 10 mg by mouth daily.    [provider]  doxycycline (VIBRA-TABS) 100 MG tablet Take 1 tablet (100 mg total) by mouth 2 (two) times daily. 11/25/19   Luetta Nutting, DO  EPINEPHrine (EPIPEN 2-PAK) 0.3 mg/0.3 mL IJ SOAJ injection Inject 0.3 mg into the muscle as needed (for allergic reaction).    [provider]  famotidine (PEPCID) 20 MG tablet Take 1 tablet (20 mg total) by mouth daily. 11/13/19   Tasia Catchings, Jaye Saal V, PA-C  Fe Cbn-Fe Gluc-FA-B12-C-DSS (FERRALET 90) 90-1 MG TABS Take 1 tablet by mouth daily before breakfast. 01/08/19   Shelly Bombard, MD  ferrous sulfate 325 (65 FE) MG tablet Take 1 tablet (325 mg total) by mouth daily. 12/29/18   Petrucelli, Samantha R, PA-C  ipratropium (ATROVENT) 0.06 % nasal spray Place 2 sprays into both nostrils 4 (four) times daily. 11/13/19   Tasia Catchings, Dymphna Wadley V, PA-C  levETIRAcetam (KEPPRA) 1000 MG tablet Take 1 tablet (1,000 mg total) by mouth 2 (two) times daily for 10 days. 12/15/19 12/25/19  Ok Edwards, PA-C  meclizine (ANTIVERT) 25 MG tablet Take 1 tablet (25 mg total) by mouth 3 (three) times daily as needed for dizziness. 12/15/19   Tasia Catchings, Hussein Macdougal V, PA-C   medroxyPROGESTERone (DEPO-PROVERA) 150 MG/ML injection Inject 1 mL (150 mg total) into the muscle every 3 (three) months. 01/08/19   Shelly Bombard, MD  montelukast (SINGULAIR) 10 MG tablet Take 1 tablet (10 mg total) by mouth at bedtime. 07/03/19   Cirigliano, Mary K, DO  naproxen (NAPROSYN) 500 MG tablet TAKE 1 TABLET (500 MG TOTAL) BY MOUTH 2 (TWO) TIMES DAILY WITH A MEAL. 09/10/19   Shelly Bombard, MD  prenatal vitamin w/FE, FA (PRENATAL 1 + 1) 27-1 MG TABS tablet Take 1 tablet by mouth daily before breakfast. 01/08/19   Shelly Bombard, MD  topiramate (TOPAMAX) 25 MG tablet Take 3 tablets (75 mg total) by mouth 2 (two) times daily. 07/07/18   Molpus, John, MD    Family History Family History  Problem Relation Age of Onset  . Hypertension Mother   . Diabetes Mother   . Cancer Mother        Colon, brain, 2 other  . Asthma Mother   . Clotting disorder Mother   . Heart disease Mother        29, stents  . Stroke Mother        2012  . Diabetes Father   . Asthma Father   . Clotting disorder Father   . Sickle cell anemia Father   . Heart disease Father        2000, stents  . Hypertension Father   . Stroke Father        2007  . Diabetes Maternal Grandmother   . Asthma Sister   . Asthma Brother   . Asthma Paternal Grandmother   . Anesthesia problems Neg Hx     Social History Social History   Tobacco Use  . Smoking status: Former Smoker    Quit date: 03/27/2014    Years since quitting: 5.7  . Smokeless tobacco: Never Used  Substance Use Topics  . Alcohol use: No  . Drug use: No     Allergies   Penicillins, Shrimp [shellfish allergy], Sulfa antibiotics, and Tea   Review of Systems Review of Systems  Reason unable to perform ROS: See HPI as above.     Physical Exam Triage Vital Signs ED Triage Vitals [12/15/19 1609]  Enc Vitals Group     BP (!) 144/88     Pulse Rate 85     Resp 18     Temp 98 F (36.7 C)     Temp Source Oral     SpO2 97 %      Weight      Height      Head  Circumference      Peak Flow      Pain Score 0     Pain Loc      Pain Edu?      Excl. in [place]?    No data found.  Updated Vital Signs BP (!) 144/88 (BP Location: Left Arm)   Pulse 85   Temp 98 F (36.7 C) (Oral)   Resp 18   SpO2 97%   Physical Exam Constitutional:      General: She is not in acute distress.    Appearance: She is well-developed. She is not ill-appearing, toxic-appearing or diaphoretic.  HENT:     Head: Normocephalic and atraumatic.     Right Ear: Tympanic membrane, ear canal and external ear normal. Tympanic membrane is not erythematous or bulging.     Left Ear: Tympanic membrane, ear canal and external ear normal. Tympanic membrane is not erythematous or bulging.     Nose:     Right Sinus: Maxillary sinus tenderness present. No frontal sinus tenderness.     Left Sinus: Maxillary sinus tenderness present. No frontal sinus tenderness.     Mouth/Throat:     Mouth: Mucous membranes are moist.     Pharynx: Oropharynx is clear. Uvula midline.  Eyes:     Extraocular Movements: Extraocular movements intact.     Conjunctiva/sclera: Conjunctivae normal.     Pupils: Pupils are equal, round, and reactive to light.     Comments: No photophobia on exam.   Cardiovascular:     Rate and Rhythm: Normal rate and regular rhythm.     Heart sounds: Normal heart sounds. No murmur. No friction rub. No gallop.   Pulmonary:     Effort: Pulmonary effort is normal. No accessory muscle usage, prolonged expiration, respiratory distress or retractions.     Breath sounds: Normal breath sounds. No stridor, decreased air movement or transmitted upper airway sounds. No decreased breath sounds, wheezing, rhonchi or rales.  Musculoskeletal:     Cervical back: Normal range of motion and neck supple.  Skin:    General: Skin is warm and dry.  Neurological:     Mental Status: She is alert and oriented to person, place, and time.     GCS: GCS eye subscore is 4. GCS  verbal subscore is 5. GCS motor subscore is 6.     Cranial Nerves: Cranial nerves are intact.     Sensory: Sensation is intact.     Motor: Motor function is intact.     Coordination: Coordination is intact.     Gait: Gait is intact.      UC Treatments / Results  Labs (all labs ordered are listed, but only abnormal results are displayed) Labs Reviewed - No data to display  EKG   Radiology No results found.  Procedures Procedures (including critical care time)  Medications Ordered in UC Medications  dexamethasone (DECADRON) injection 10 mg (has no administration in time range)  ketorolac (TORADOL) 30 MG/ML injection 30 mg (has no administration in time range)    Initial Impression / Assessment and Plan / UC Course  I have reviewed the triage vital signs and the nursing notes.  Pertinent labs & imaging results that were available during my care of the patient were reviewed by me and considered in my medical decision making (see chart for details).    Neurology exam grossly intact.  No acute blood loss, without tachycardia.?  Seizure last night, the patient was asleep at the time.  Out of  Keppra for the past 2 days.  Will refill Keppra for the next week, and follow-up with neurology for further evaluation.  Will provide Toradol and Decadron injection for for possible seizure/sinus pressure causing symptoms.  Meclizine for vertigo from position changes.  Push fluids.  Return precautions given.  Patient expresses understanding and agrees to plan.  Final Clinical Impressions(s) / UC Diagnoses   Final diagnoses:  Dizziness  Episodic lightheadedness  Sinus pressure   ED Prescriptions    Medication Sig Dispense Auth. Provider   levETIRAcetam (KEPPRA) 1000 MG tablet Take 1 tablet (1,000 mg total) by mouth 2 (two) times daily for 10 days. 20 tablet Kenon Delashmit V, PA-C   meclizine (ANTIVERT) 25 MG tablet Take 1 tablet (25 mg total) by mouth 3 (three) times daily as needed for  dizziness. 15 tablet Ok Edwards, PA-C     PDMP not reviewed this encounter.   Ok Edwards, PA-C 12/15/19 1642

## 2019-12-22 ENCOUNTER — Telehealth: Payer: Self-pay | Admitting: Family Medicine

## 2019-12-22 ENCOUNTER — Encounter: Payer: Self-pay | Admitting: Family Medicine

## 2019-12-22 ENCOUNTER — Telehealth: Payer: Self-pay

## 2019-12-22 NOTE — Telephone Encounter (Signed)
Questions for Screening COVID-19  Symptom onset: ongoing sinus  Travel or Contacts:no  During this illness, did/does the patient experience any of the following symptoms? Fever >100.80F []   Yes [x]   No []   Unknown Subjective fever (felt feverish) []   Yes [x]   No []   Unknown Chills []   Yes [x]   No []   Unknown Muscle aches (myalgia) []   Yes [x]   No []   Unknown Runny nose (rhinorrhea) [x]   Yes []   No []   Unknown Sore throat []   Yes [x]   No []   Unknown Cough (new onset or worsening of chronic cough) []   Yes [x]   No []   Unknown Shortness of breath (dyspnea) []   Yes [x]   No []   Unknown Nausea or vomiting []   Yes [x]   No []   Unknown Headache [x]   Yes []   No []   Unknown Abdominal pain  []   Yes [x]   No []   Unknown Diarrhea (?3 loose/looser than normal stools/24hr period) []   Yes [x]   No []   Unknown

## 2019-12-23 ENCOUNTER — Ambulatory Visit: Payer: Self-pay | Admitting: Family Medicine

## 2020-01-04 IMAGING — CR DG CHEST 2V
2 series · 2 of 2 positions shown · non-contrast
Comparison: None.

CLINICAL DATA: Chest pain and fever

EXAM:
CHEST - 2 VIEW

[w chest pa]
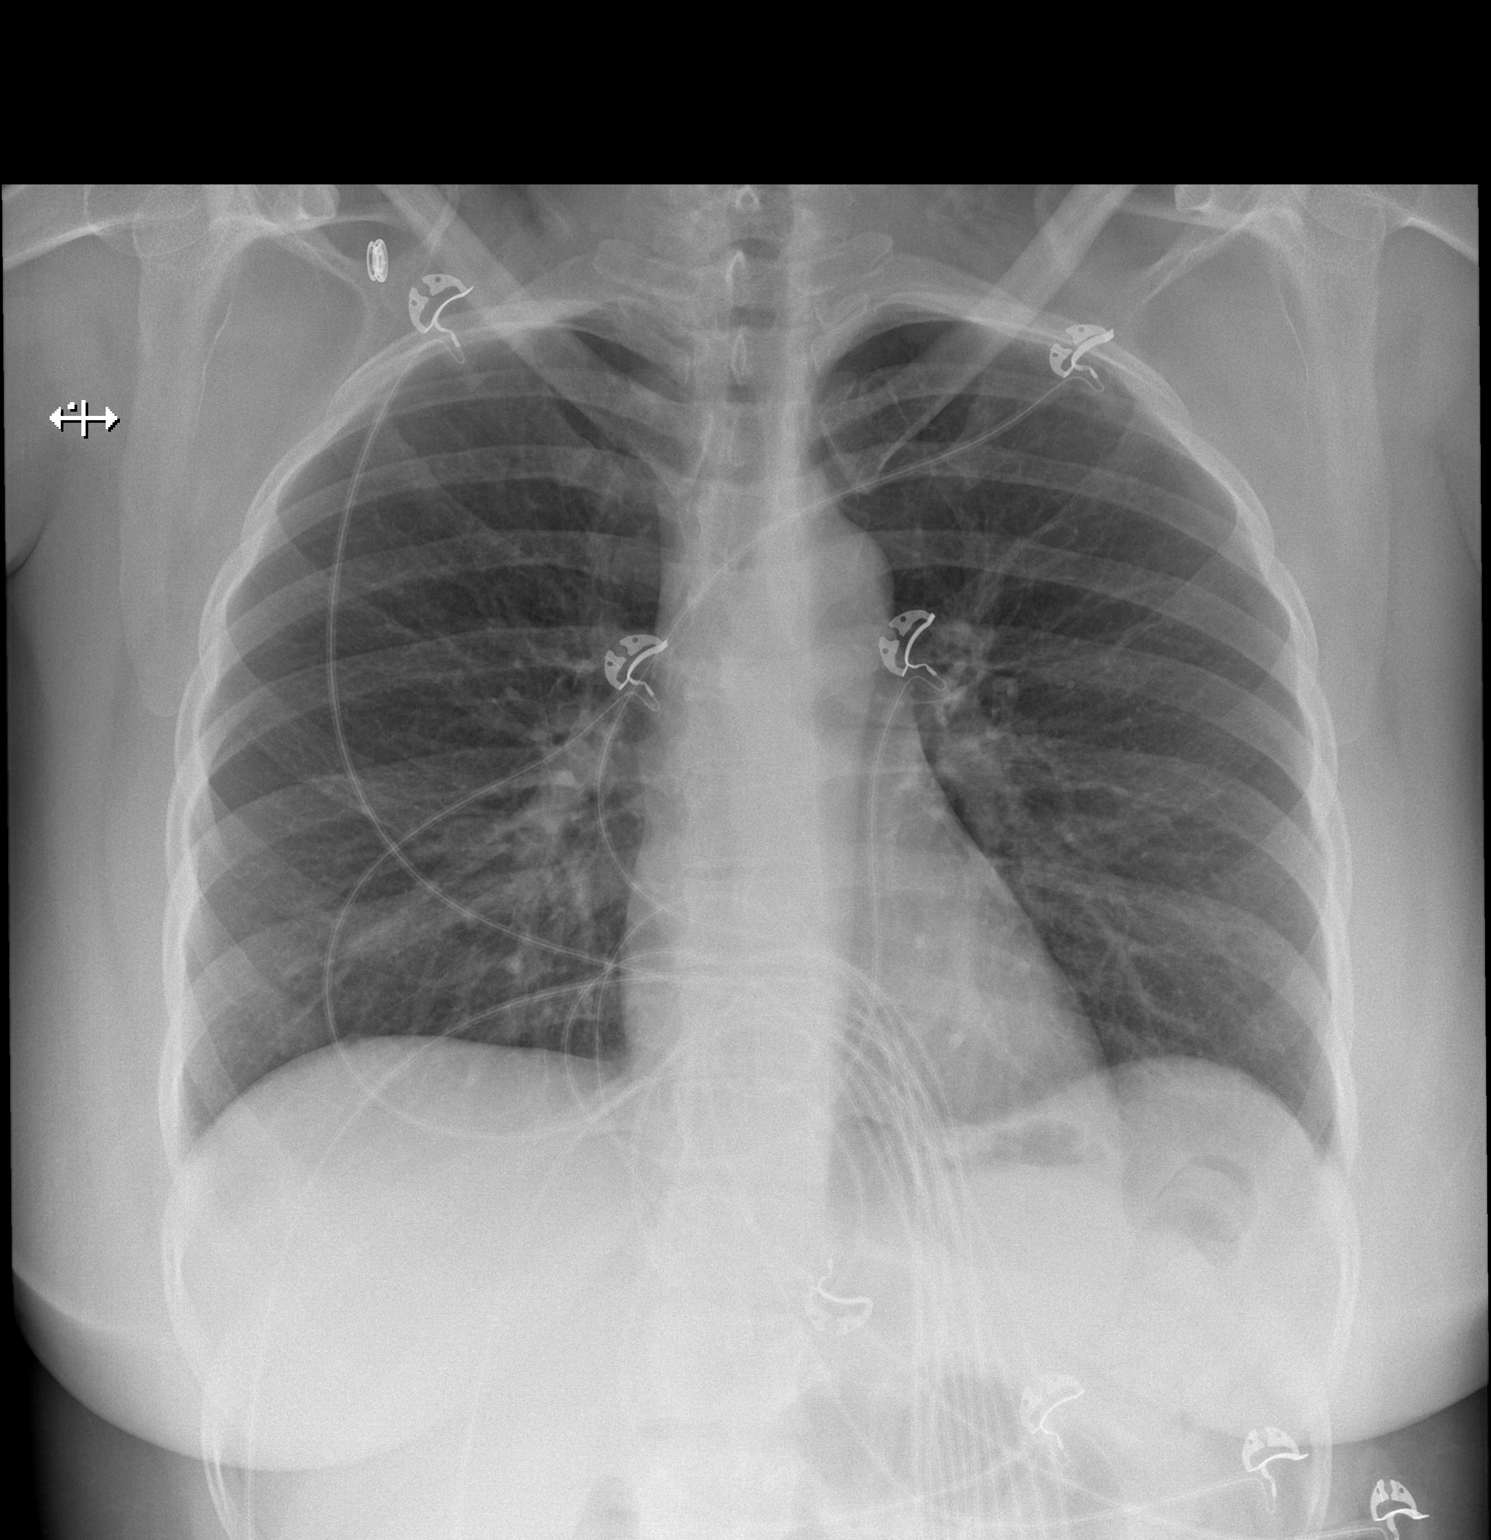

[w chest lat]
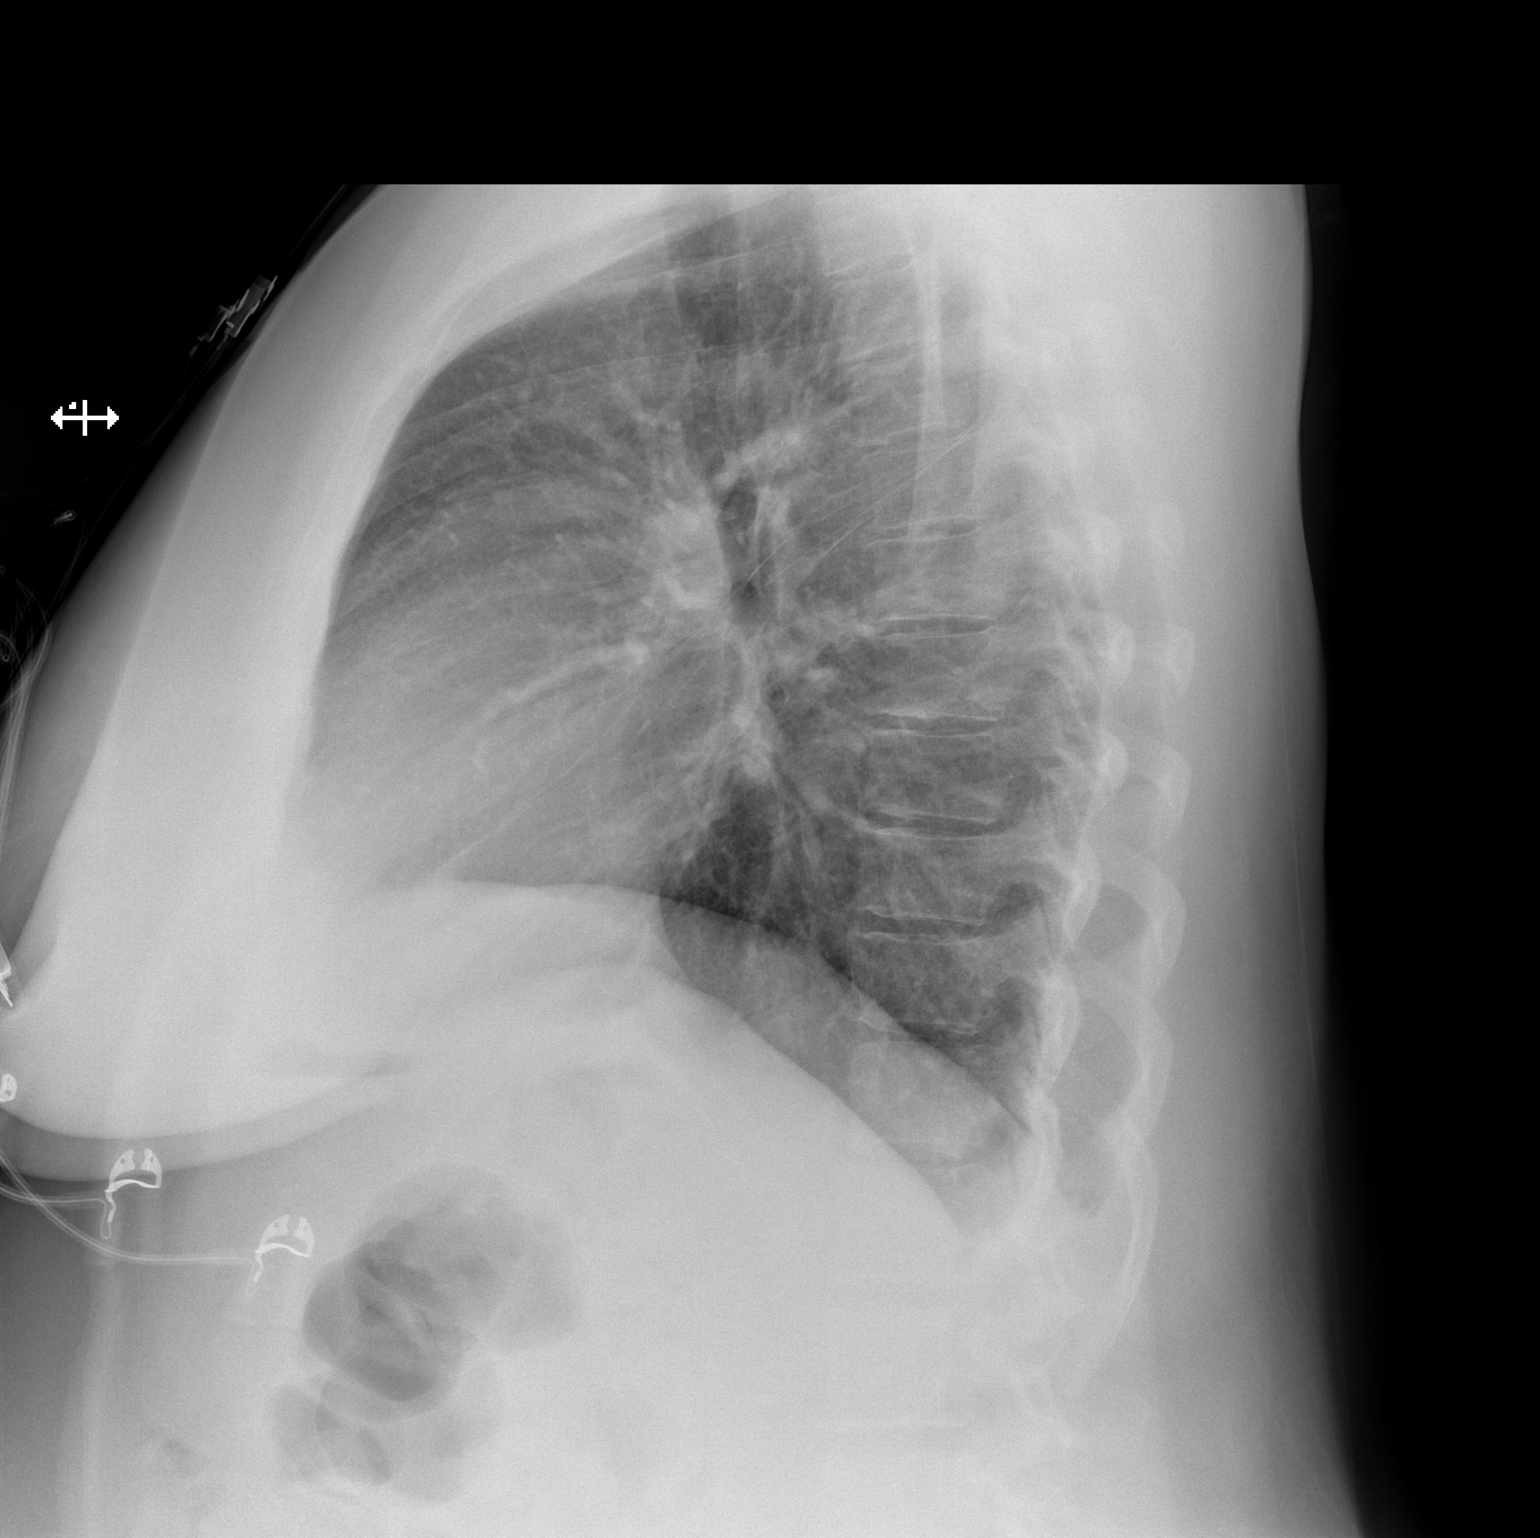

[2 of 2 positions shown; findings below may reference images not displayed]

FINDINGS: Lungs are clear. Heart size and pulmonary vascularity are normal. No
adenopathy. No pneumothorax. No bone lesions.
IMPRESSION: No edema or consolidation.

## 2020-01-08 IMAGING — US US PELVIS COMPLETE WITH TRANSVAGINAL
1 series · 13 of 25 positions shown · non-contrast
Comparison: None

CLINICAL DATA: Abnormal uterine bleeding, LMP 12/29/2018

EXAM:
TRANSABDOMINAL AND TRANSVAGINAL ULTRASOUND OF PELVIS
TECHNIQUE: Both transabdominal and transvaginal ultrasound examinations of the
pelvis were performed. Transabdominal technique was performed for
global imaging of the pelvis including uterus, ovaries, adnexal
regions, and pelvic cul-de-sac. It was necessary to proceed with
endovaginal exam following the transabdominal exam to visualize the
RIGHT ovary.

[Series 1: us pelvis complete with transvaginal · 0.21mm/px · 13 of 66 slices shown]
[im 1/66]
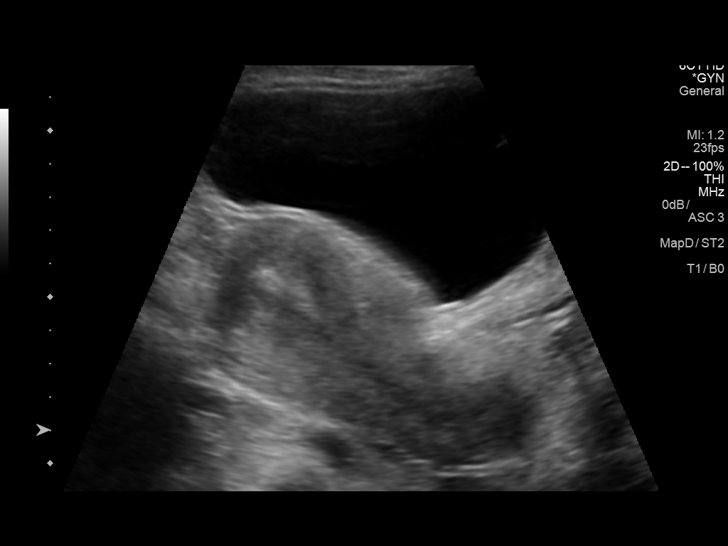
[im 6/66]
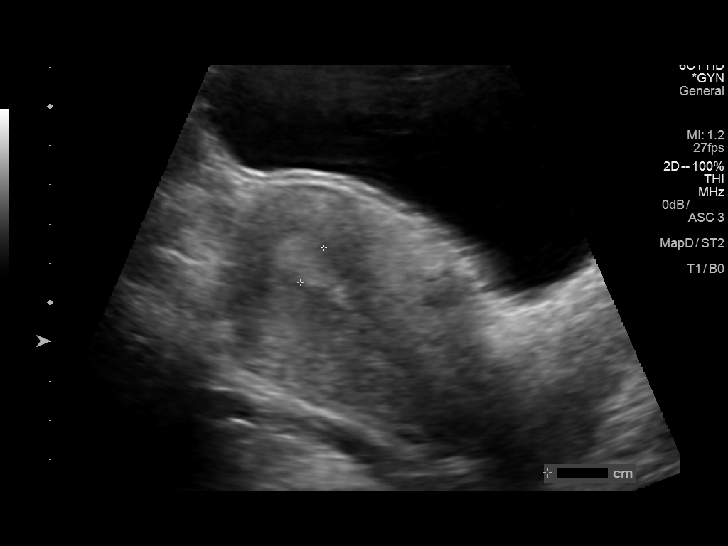
[im 11/66]
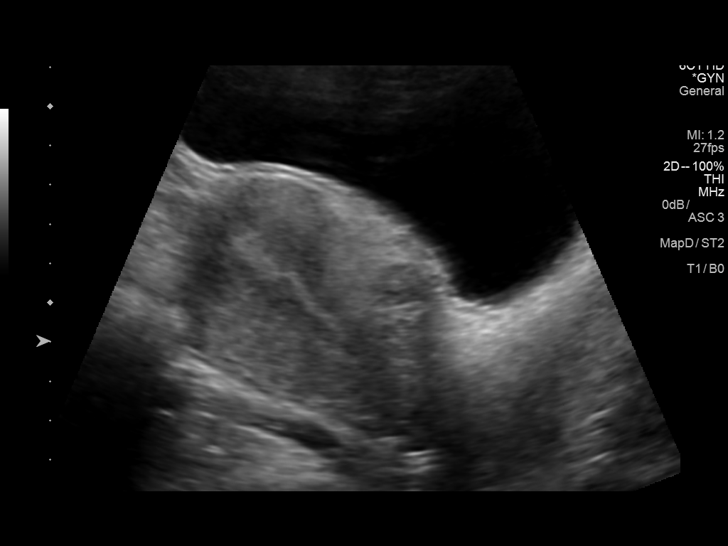
[im 17/66]
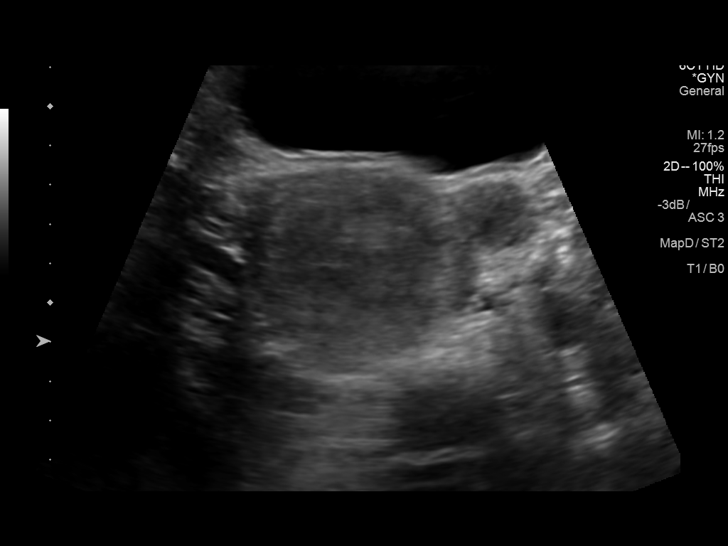
[im 22/66]
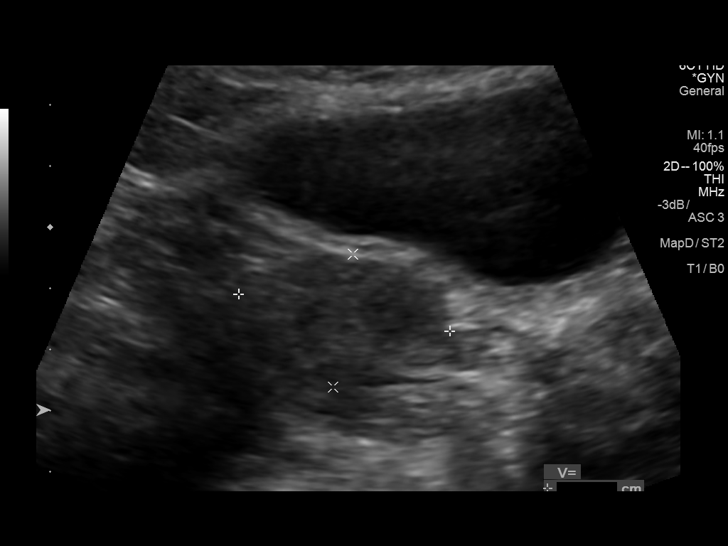
[im 28/66]
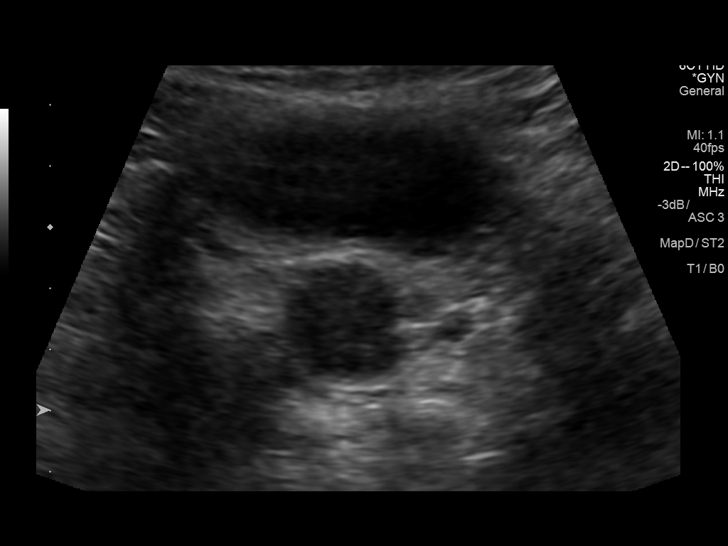
[im 33/66]
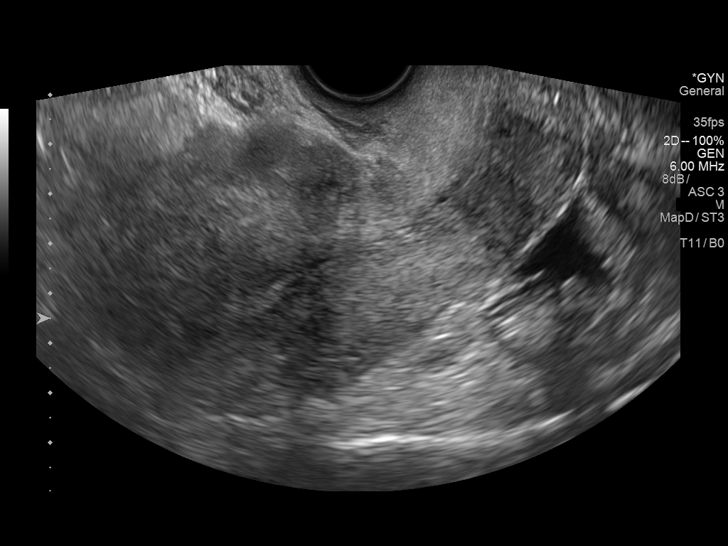
[im 38/66]
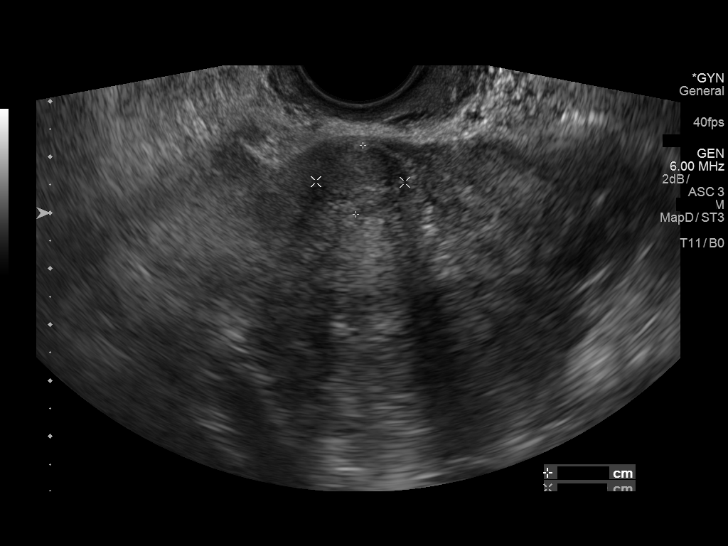
[im 44/66]
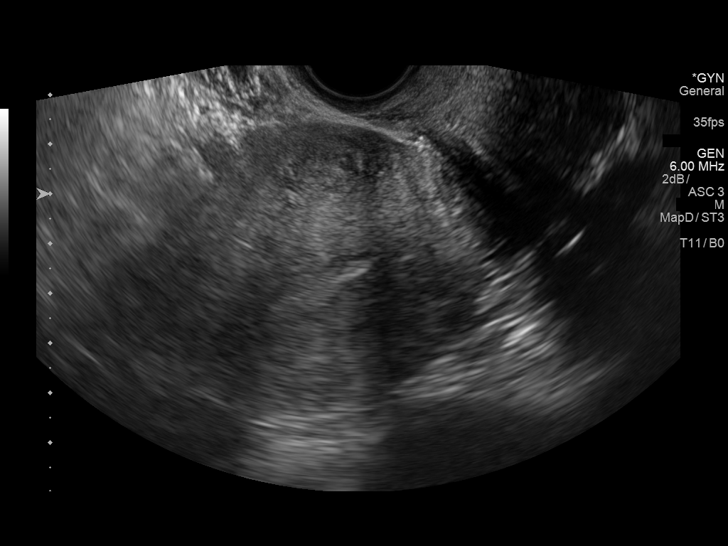
[im 49/66]
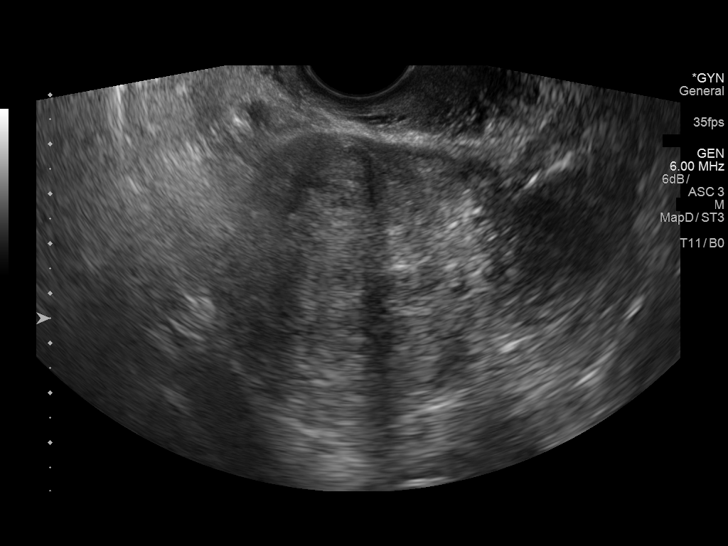
[im 55/66]
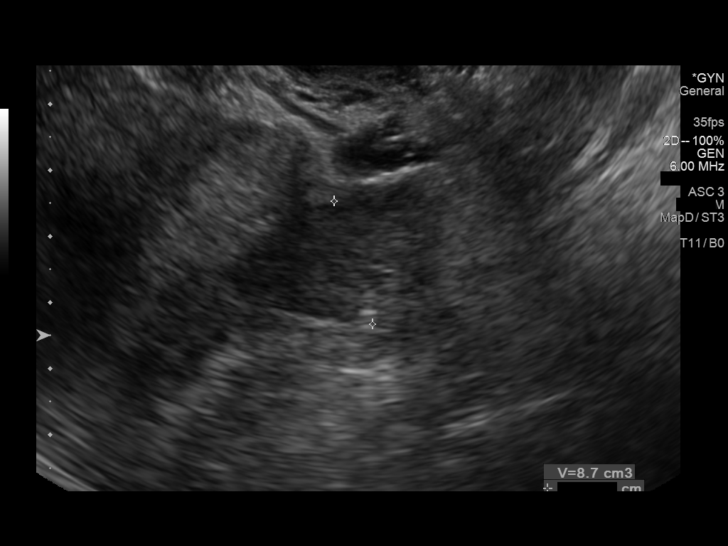
[im 60/66]
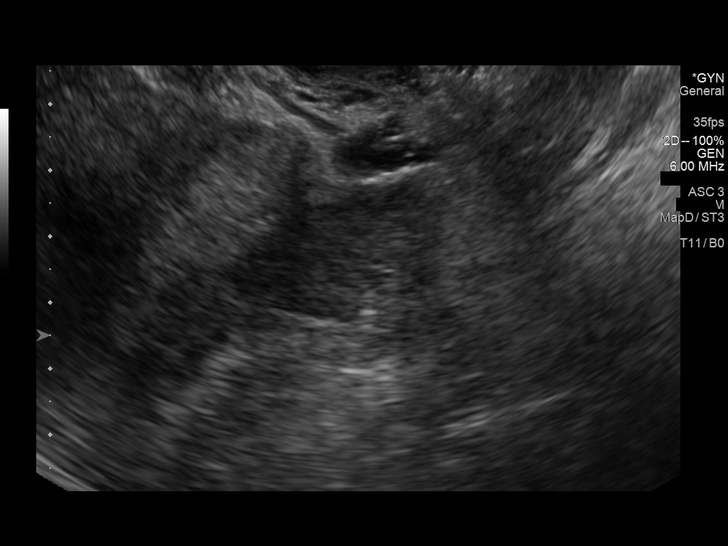
[im 66/66]
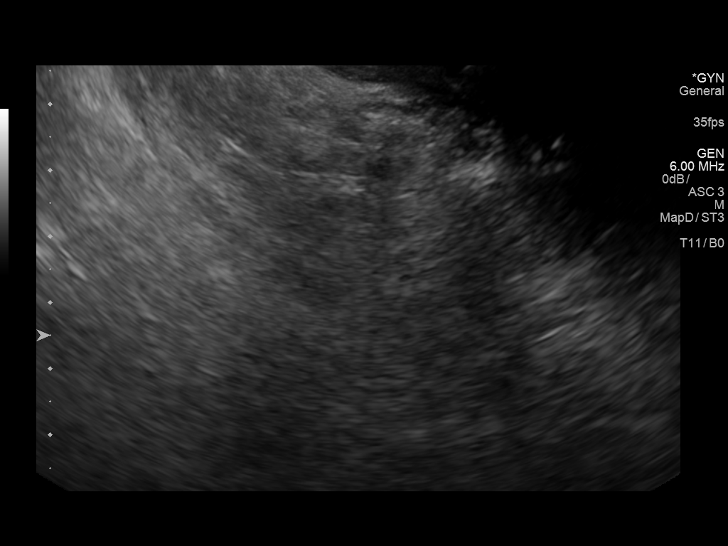

[13 of 25 positions shown; findings below may reference images not displayed]

FINDINGS: Uterus

Measurements: 10.7 x 5.1 x 6.4 cm = volume: 179 mL. Anterior wall
leiomyoma at the mid uterus, intramural, 19 x 16 x 12 mm. No
additional uterine masses.

Endometrium

Thickness: 9 mm.  No endometrial fluid or focal abnormalities.

Right ovary

Not visualized on either transabdominal or endovaginal imaging,
likely obscured by bowel

Left ovary

Measurements: 4.4 x 1.9 x 2.0 cm = volume: 8.7 mL. Normal morphology
without mass. Internal blood flow present on color Doppler imaging.

Other findings

No free pelvic fluid or adnexal masses.
IMPRESSION: Small anterior wall intramural uterine leiomyoma 1.9 cm greatest
diameter.

Nonvisualization of RIGHT ovary.

Remainder of exam unremarkable.

## 2020-01-09 IMAGING — CR DG CHEST 2V
2 series · 2 of 2 positions shown · non-contrast
Comparison: 10/09/2018

CLINICAL DATA: Cough, fever, and dizziness.  Asthma.

EXAM:
CHEST - 2 VIEW

[w chest pa]
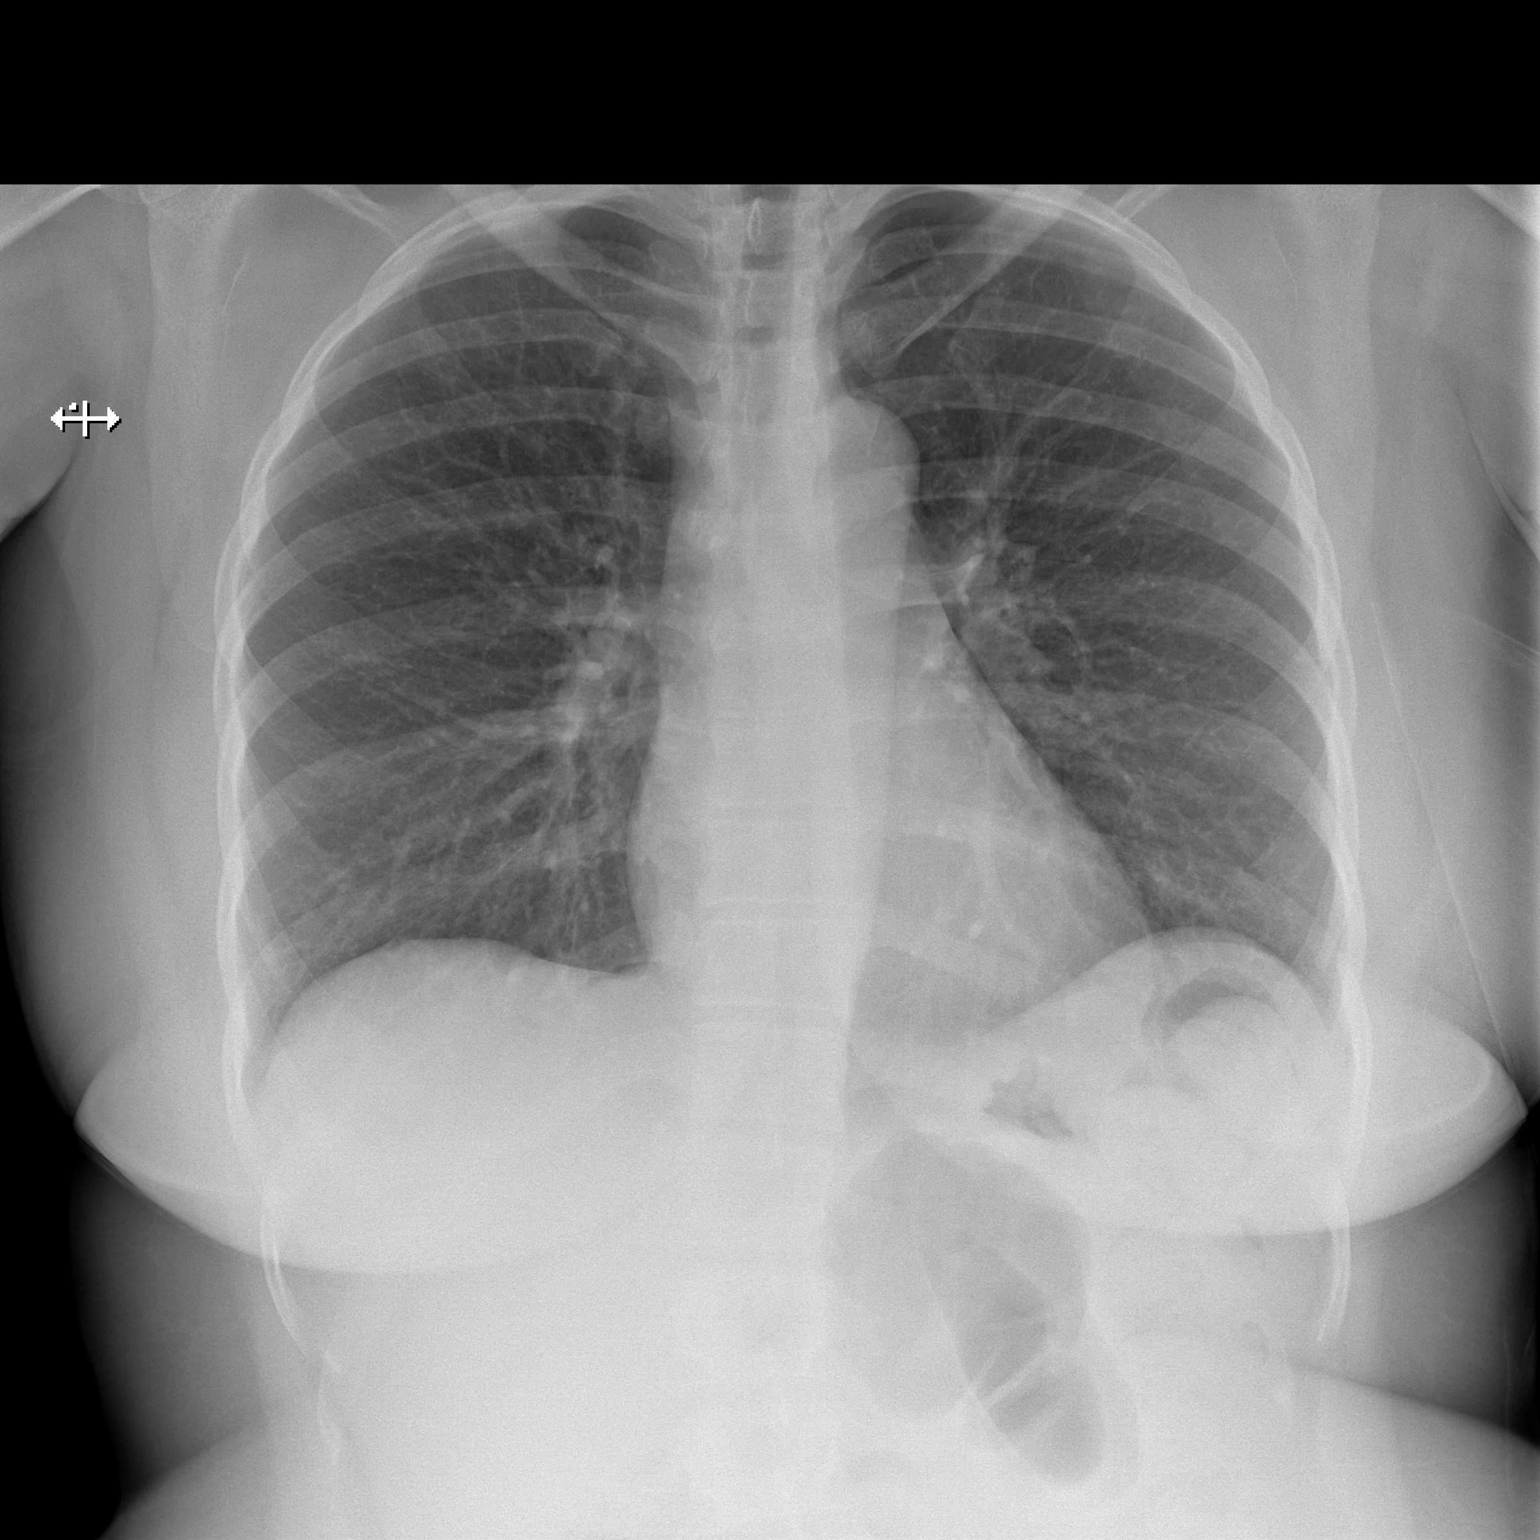

[w chest lat]
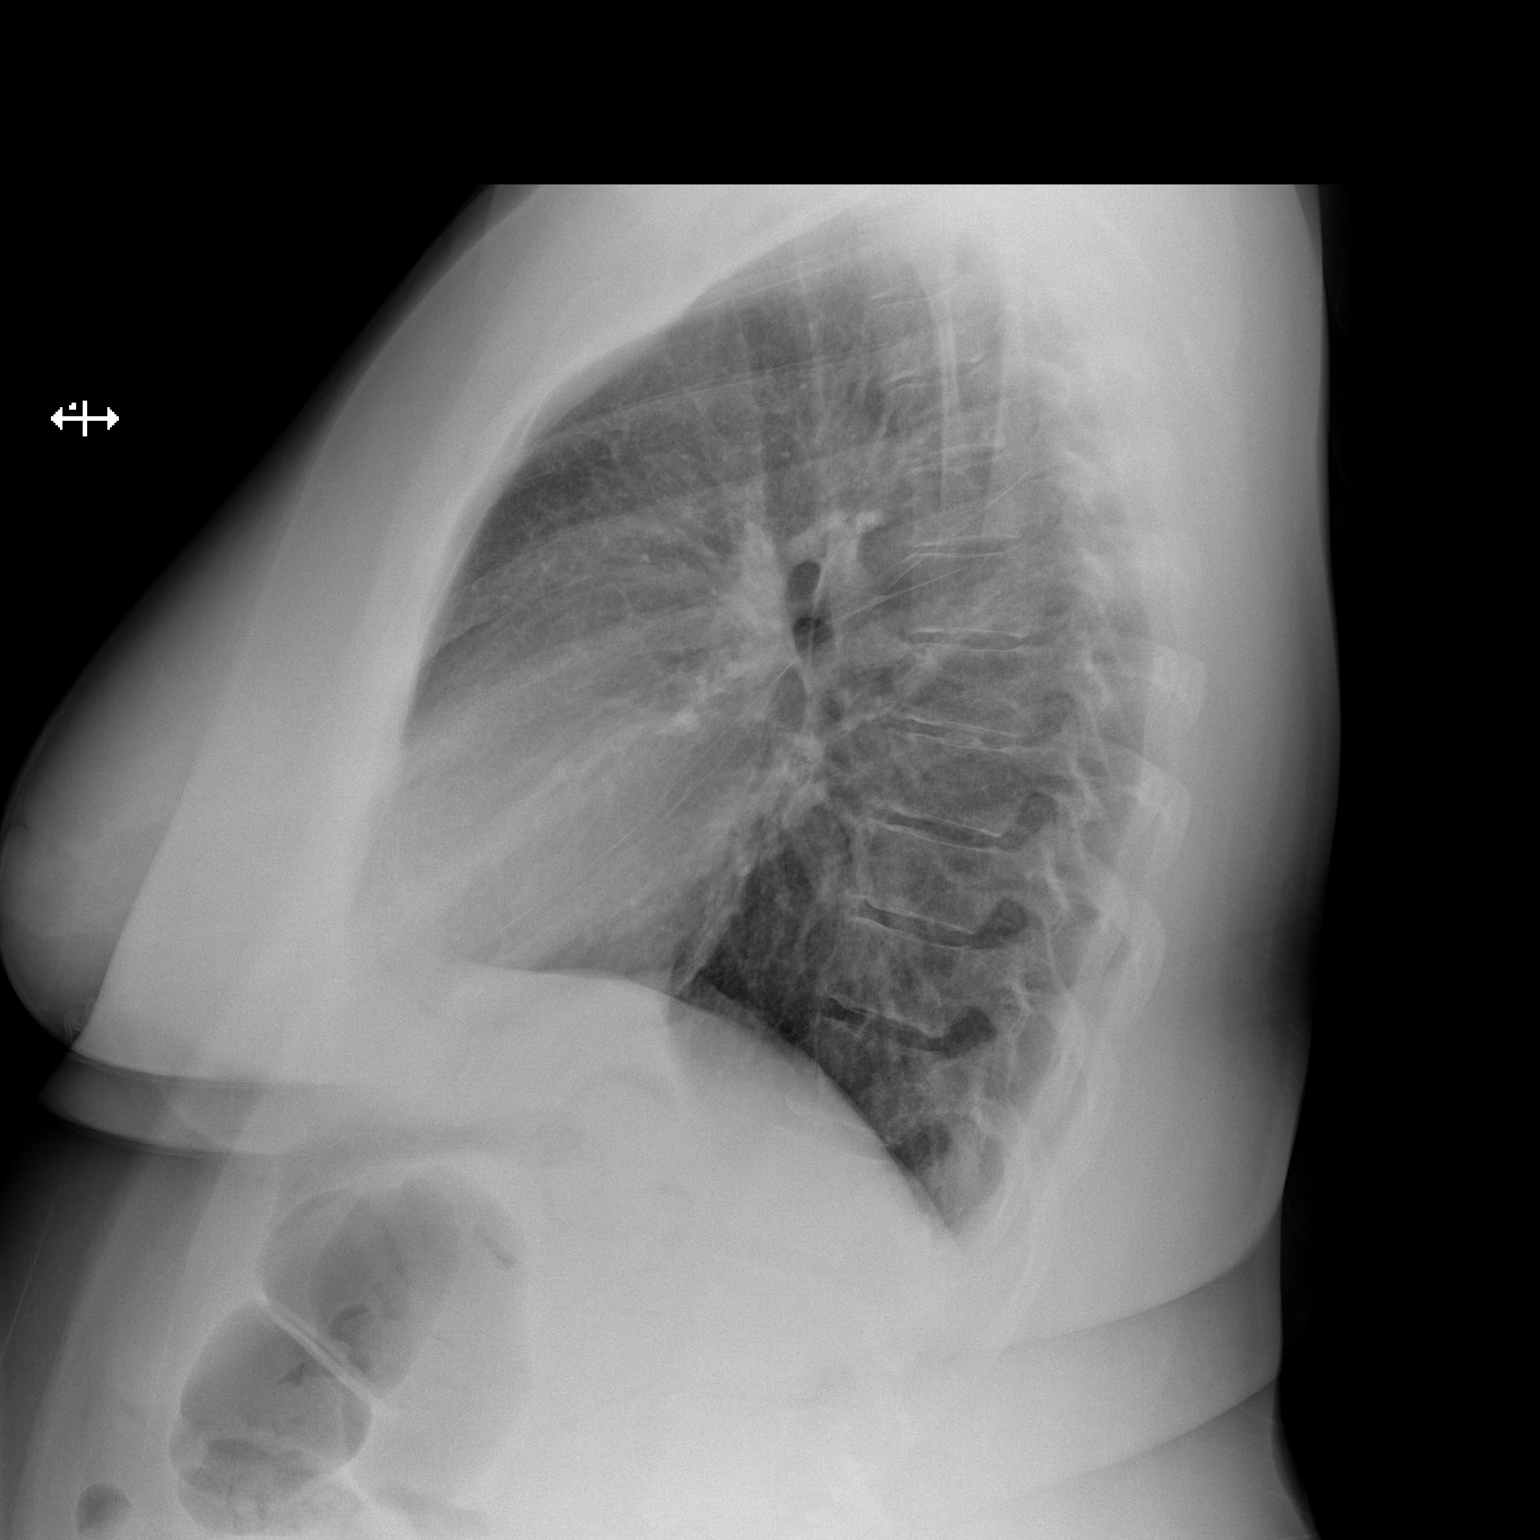

[2 of 2 positions shown; findings below may reference images not displayed]

FINDINGS: The heart size and mediastinal contours are within normal limits.
Both lungs are clear. The visualized skeletal structures are
unremarkable.
IMPRESSION: Negative.  No active cardiopulmonary disease.

## 2020-01-12 ENCOUNTER — Other Ambulatory Visit: Payer: BLUE CROSS/BLUE SHIELD | Attending: Internal Medicine

## 2020-01-12 DIAGNOSIS — Z20822 Contact with and (suspected) exposure to covid-19: Secondary | ICD-10-CM

## 2020-01-13 LAB — NOVEL CORONAVIRUS, NAA: SARS-CoV-2, NAA: NOT DETECTED

## 2020-01-14 ENCOUNTER — Ambulatory Visit (HOSPITAL_COMMUNITY)
Admission: EM | Admit: 2020-01-14 | Discharge: 2020-01-14 | Disposition: A | Discharge status: Home / Self Care | Payer: BLUE CROSS/BLUE SHIELD | Attending: Family Medicine | Admitting: Family Medicine

## 2020-01-14 ENCOUNTER — Other Ambulatory Visit: Payer: Self-pay

## 2020-01-14 ENCOUNTER — Encounter (HOSPITAL_COMMUNITY): Payer: Self-pay

## 2020-01-14 DIAGNOSIS — M5442 Lumbago with sciatica, left side: Secondary | ICD-10-CM | POA: Diagnosis not present

## 2020-01-14 MED ORDER — CYCLOBENZAPRINE HCL 10 MG PO TABS: 10.0000 mg | ORAL_TABLET | Freq: Two times a day (BID) | ORAL | 0 refills | Status: DC | PRN

## 2020-01-14 MED ORDER — KETOROLAC TROMETHAMINE 30 MG/ML IJ SOLN
30.0000 mg | Freq: Once | INTRAMUSCULAR | Status: AC
  Administered 2020-01-14: 30 mg via INTRAMUSCULAR

## 2020-01-14 MED ORDER — KETOROLAC TROMETHAMINE 30 MG/ML IJ SOLN
INTRAMUSCULAR | Status: AC
  Filled 2020-01-14: qty 1

## 2020-01-14 MED ORDER — PREDNISONE 10 MG (21) PO TBPK: ORAL_TABLET | ORAL | 0 refills | Status: DC

## 2020-01-14 MED ORDER — TRAMADOL HCL 50 MG PO TABS: 50.0000 mg | ORAL_TABLET | Freq: Two times a day (BID) | ORAL | 0 refills | Status: DC | PRN

## 2020-01-14 NOTE — ED Triage Notes (Addendum)
Pt state she has back pain and spasms x 3 weeks. 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

## 2020-01-14 NOTE — Discharge Instructions (Signed)
Take the medication as prescribed If your symptoms continue or worsen you will need to follow up.

## 2020-01-15 NOTE — ED Provider Notes (Signed)
[facility]    CSN: [id number] Arrival date & time: 01/14/20  1136      History   Chief Complaint Chief Complaint  Patient presents with  . Back Pain    HPI Nicole [name] is a 44 y.o. female.   Patient is a 44 year old female past medical history of anemia, asthma, blood transfusion, primary, migraine, seizures, stroke, sickle cell trait, depression, adjustment disorder, anxiety, acute pericarditis, GERD.  She presents today with approximately 3 weeks of worsening back discomfort.  Describes as tightness in her back and worse with movement.  Started while she was at work and lifted a patient weighing approximately 350 pounds.  He felt a snap in her back.  Since she has had spasming.  She is been taken Tylenol and ibuprofen without much relief of her symptoms.  Some associated numbness, tingling and radiation of pain down the left buttocks and left upper leg.  No saddle paresthesias, loss of bowel or bladder function.  No fever, dysuria, hematuria or urinary frequency. Unable to get comfortable and rest at night due to the pain.   ROS per HPI      Past Medical History:  Diagnosis Date  . Anemia   . Asthma   . Atypical chest pain    a. ?pericarditis 2015.  Marland Kitchen Blood transfusion 2011   r/t anemia  . Fibroid   . Migraine   . Seizures ([place])   . Sickle cell trait ([place])   . Stroke ([place])   . Tooth abscess     Patient Active Problem List   Diagnosis Date Noted  . 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 Depression   . Generalized abdominal pain   . Fatigue 10/28/2015  . Adjustment disorder with depressed mood 10/21/2015  . Dizziness   . Weakness 11/18/2014  . OSA (obstructive sleep apnea) 10/21/2014  . Acute rhinosinusitis 10/09/2014  . Leg pain, lateral 09/04/2014  . Migraines 08/13/2014  . Seizure ([place]) 06/04/2014  . Leiomyoma of uterus, unspecified 02/11/2014  . GERD (gastroesophageal reflux disease) 01/28/2014  . Candidiasis of vulva and vagina 01/28/2014  . Unspecified  symptom associated with female genital organs 01/28/2014  . Chest pain 01/20/2014  . Acute pericarditis, unspecified 01/20/2014    Past Surgical History:  Procedure Laterality Date  . TUBAL LIGATION Bilateral 2005    OB History    Gravida  3   Para  2   Term  1   Preterm  1   AB  1   Living  2     SAB  0   TAB  0   Ectopic  1   Multiple  0   Live Births  2            Home Medications    Prior to Admission medications   Medication Sig Start Date End Date Taking? Authorizing Provider  acetaminophen (TYLENOL) 325 MG tablet Take 650 mg by mouth every 6 (six) hours as needed for moderate pain or headache.    [provider]  ALAYCEN 1/35 tablet TAKE 1 TABLET BY MOUTH EVERY DAY 08/07/19   Shelly Bombard, MD  cetirizine (ZYRTEC) 10 MG tablet Take 10 mg by mouth daily.    [provider]  cyclobenzaprine (FLEXERIL) 10 MG tablet Take 1 tablet (10 mg total) by mouth 2 (two) times daily as needed for muscle spasms. 01/14/20   Loura Halt A, NP  EPINEPHrine (EPIPEN 2-PAK) 0.3 mg/0.3 mL IJ SOAJ injection Inject 0.3 mg into  the muscle as needed (for allergic reaction).    [provider]  famotidine (PEPCID) 20 MG tablet Take 1 tablet (20 mg total) by mouth daily. 11/13/19   Tasia Catchings, Amy V, PA-C  Fe Cbn-Fe Gluc-FA-B12-C-DSS (FERRALET 90) 90-1 MG TABS Take 1 tablet by mouth daily before breakfast. 01/08/19   Shelly Bombard, MD  ferrous sulfate 325 (65 FE) MG tablet Take 1 tablet (325 mg total) by mouth daily. 12/29/18   Petrucelli, Samantha R, PA-C  ipratropium (ATROVENT) 0.06 % nasal spray Place 2 sprays into both nostrils 4 (four) times daily. 11/13/19   Tasia Catchings, Amy V, PA-C  levETIRAcetam (KEPPRA) 1000 MG tablet Take 1 tablet (1,000 mg total) by mouth 2 (two) times daily for 10 days. 12/15/19 12/25/19  Ok Edwards, PA-C  meclizine (ANTIVERT) 25 MG tablet Take 1 tablet (25 mg total) by mouth 3 (three) times daily as needed for dizziness. 12/15/19   Tasia Catchings, Amy V, PA-C   medroxyPROGESTERone (DEPO-PROVERA) 150 MG/ML injection Inject 1 mL (150 mg total) into the muscle every 3 (three) months. 01/08/19   Shelly Bombard, MD  montelukast (SINGULAIR) 10 MG tablet Take 1 tablet (10 mg total) by mouth at bedtime. 07/03/19   Cirigliano, Mary K, DO  naproxen (NAPROSYN) 500 MG tablet TAKE 1 TABLET (500 MG TOTAL) BY MOUTH 2 (TWO) TIMES DAILY WITH A MEAL. 09/10/19   Shelly Bombard, MD  predniSONE (STERAPRED UNI-PAK 21 TAB) 10 MG (21) TBPK tablet 6 tabs for 1 day, then 5 tabs for 1 das, then 4 tabs for 1 day, then 3 tabs for 1 day, 2 tabs for 1 day, then 1 tab for 1 day 01/14/20   Loura Halt A, NP  prenatal vitamin w/FE, FA (PRENATAL 1 + 1) 27-1 MG TABS tablet Take 1 tablet by mouth daily before breakfast. 01/08/19   Shelly Bombard, MD  topiramate (TOPAMAX) 25 MG tablet Take 3 tablets (75 mg total) by mouth 2 (two) times daily. 07/07/18   Molpus, Jenny Reichmann, MD  traMADol (ULTRAM) 50 MG tablet Take 1 tablet (50 mg total) by mouth every 12 (twelve) hours as needed for up to 3 days. 01/14/20 01/17/20  Orvan July, NP    Family History Family History  Problem Relation Age of Onset  . Hypertension Mother   . Diabetes Mother   . Cancer Mother        Colon, brain, 2 other  . Asthma Mother   . Clotting disorder Mother   . Heart disease Mother        35, stents  . Stroke Mother        2012  . Diabetes Father   . Asthma Father   . Clotting disorder Father   . Sickle cell anemia Father   . Heart disease Father        2000, stents  . Hypertension Father   . Stroke Father        2007  . Diabetes Maternal Grandmother   . Asthma Sister   . Asthma Brother   . Asthma Paternal Grandmother   . Anesthesia problems Neg Hx     Social History Social History   Tobacco Use  . Smoking status: Former Smoker    Quit date: 03/27/2014    Years since quitting: 5.8  . Smokeless tobacco: Never Used  Substance Use Topics  . Alcohol use: No  . Drug use: No     Allergies    Penicillins, Shrimp [shellfish allergy], Sulfa antibiotics,  and Tea   Review of Systems Review of Systems   Physical Exam Triage Vital Signs ED Triage Vitals  Enc Vitals Group     BP 01/14/20 1222 (!) 191/146     Pulse Rate 01/14/20 1222 90     Resp 01/14/20 1222 18     Temp 01/14/20 1222 98.6 F (37 C)     Temp Source 01/14/20 1222 Oral     SpO2 01/14/20 1222 100 %     Weight 01/14/20 1218 255 lb (115.7 kg)     Height --      Head Circumference --      Peak Flow --      Pain Score 01/14/20 1321 8     Pain Loc --      Pain Edu? --      Excl. in [place]? --    No data found.  Updated Vital Signs BP (!) 191/146 (BP Location: Right Arm)   Pulse 90   Temp 98.6 F (37 C) (Oral)   Resp 18   Wt 255 lb (115.7 kg)   SpO2 100%   BMI 42.43 kg/m   Visual Acuity Right Eye Distance:   Left Eye Distance:   Bilateral Distance:    Right Eye Near:   Left Eye Near:    Bilateral Near:     Physical Exam Vitals and nursing note reviewed.  Constitutional:      Comments: Appears uncomfortable   HENT:     Head: Normocephalic and atraumatic.     Nose: Nose normal.  Eyes:     Conjunctiva/sclera: Conjunctivae normal.  Pulmonary:     Effort: Pulmonary effort is normal.  Musculoskeletal:     Cervical back: Normal range of motion.     Thoracic back: Spasms present.     Lumbar back: Swelling and spasms present. Normal range of motion. Positive left straight leg raise test.       Back:  Skin:    General: Skin is warm and dry.     Findings: No rash.  Neurological:     Mental Status: She is alert.  Psychiatric:        Mood and Affect: Mood normal.      UC Treatments / Results  Labs (all labs ordered are listed, but only abnormal results are displayed) Labs Reviewed - No data to display  EKG   Radiology No results found.  Procedures Procedures (including critical care time)  Medications Ordered in UC Medications  ketorolac (TORADOL) 30 MG/ML injection 30 mg (30  mg Intramuscular Given 01/14/20 1315)    Initial Impression / Assessment and Plan / UC Course  I have reviewed the triage vital signs and the nursing notes.  Pertinent labs & imaging results that were available during my care of the patient were reviewed by me and considered in my medical decision making (see chart for details).     Lower back pain with left-sided sciatica and muscle spasming.- Toradol injection given here in clinic for acute pain  Prednisone taper sent to pharmacy along with Flexeril for muscle relaxant . Tramadol for severe pain and to help rest at night. Recommended rest and no heavy lifting  Final Clinical Impressions(s) / UC Diagnoses   Final diagnoses:  Acute midline low back pain with left-sided sciatica     Discharge Instructions     Take the medication as prescribed If your symptoms continue or worsen you will need to follow up.     ED Prescriptions  Medication Sig Dispense Auth. Provider   predniSONE (STERAPRED UNI-PAK 21 TAB) 10 MG (21) TBPK tablet 6 tabs for 1 day, then 5 tabs for 1 das, then 4 tabs for 1 day, then 3 tabs for 1 day, 2 tabs for 1 day, then 1 tab for 1 day 21 tablet Kaito Schulenburg A, NP   cyclobenzaprine (FLEXERIL) 10 MG tablet Take 1 tablet (10 mg total) by mouth 2 (two) times daily as needed for muscle spasms. 20 tablet Ryka Beighley A, NP   traMADol (ULTRAM) 50 MG tablet Take 1 tablet (50 mg total) by mouth every 12 (twelve) hours as needed for up to 3 days. 6 tablet Finlay Godbee A, NP     I have reviewed the PDMP during this encounter.   Loura Halt A, NP 01/15/20 [phone]

## 2020-01-16 ENCOUNTER — Other Ambulatory Visit: Payer: Self-pay

## 2020-01-16 ENCOUNTER — Ambulatory Visit (HOSPITAL_COMMUNITY)
Admission: EM | Admit: 2020-01-16 | Discharge: 2020-01-16 | Disposition: A | Discharge status: Home / Self Care | Payer: BLUE CROSS/BLUE SHIELD | Attending: Family Medicine | Admitting: Family Medicine

## 2020-01-16 ENCOUNTER — Ambulatory Visit (INDEPENDENT_AMBULATORY_CARE_PROVIDER_SITE_OTHER): Payer: BLUE CROSS/BLUE SHIELD

## 2020-01-16 ENCOUNTER — Encounter (HOSPITAL_COMMUNITY): Payer: Self-pay | Admitting: Family Medicine

## 2020-01-16 DIAGNOSIS — M545 Low back pain: Secondary | ICD-10-CM

## 2020-01-16 DIAGNOSIS — M5136 Other intervertebral disc degeneration, lumbar region: Secondary | ICD-10-CM

## 2020-01-16 DIAGNOSIS — N946 Dysmenorrhea, unspecified: Secondary | ICD-10-CM

## 2020-01-16 MED ORDER — NAPROXEN 500 MG PO TABS: 500.0000 mg | ORAL_TABLET | Freq: Two times a day (BID) | ORAL | 5 refills | Status: DC

## 2020-01-16 MED ORDER — KETOROLAC TROMETHAMINE 60 MG/2ML IM SOLN
60.0000 mg | Freq: Once | INTRAMUSCULAR | Status: AC
  Administered 2020-01-16: 60 mg via INTRAMUSCULAR

## 2020-01-16 MED ORDER — TRAMADOL HCL 50 MG PO TABS: 50.0000 mg | ORAL_TABLET | Freq: Four times a day (QID) | ORAL | 0 refills | Status: DC | PRN

## 2020-01-16 MED ORDER — ACETAMINOPHEN 500 MG PO TABS: 500.0000 mg | ORAL_TABLET | Freq: Four times a day (QID) | ORAL | 0 refills | Status: DC | PRN

## 2020-01-16 MED ORDER — KETOROLAC TROMETHAMINE 60 MG/2ML IM SOLN
INTRAMUSCULAR | Status: AC
  Filled 2020-01-16: qty 2

## 2020-01-16 NOTE — ED Notes (Signed)
Patient ambulatory with steady gait past nurses desk and mumbled something unintelligible before walking out of the building with tears in her eyes; pt refused to stop and speak with staff about her reason for leaving. Provider aware.

## 2020-01-16 NOTE — ED Provider Notes (Addendum)
HPI   Pt is a 44 year old female that returns today for xray's. She was seen here 2 days ago for lower back pain and feels like the pain is not improved at all. She has been taking the medications as prescribed. Back pain with radiation down the left leg, numbness and tingling.   Physical Exam  Constitutional: She appears well-developed and well-nourished.  HENT:  Head: Normocephalic and atraumatic.  Respiratory: Effort normal.  Neurological: She is alert.  Skin: Skin is warm and dry.   A&P  Pt sent for x rays and became upset in xray due  to her pain. She went out to her car and called into the clinic. Dr. Lanny Cramp spoke with her over the phone and she decided to come back into the clinic.  She was given 60 mg of Toradol and results discussed.  Discharged home  X ray results Exam is limited as described above. No definite fracture is noted. Possible degenerative change may be present at L4-5, but without lateral projection this cannot be ascertained adequately.   Recommended to follow up with her primary for MRI Gave work not extension.     Orvan July, NP 01/18/20 1310    Loura Halt A, NP 01/18/20 1311

## 2020-01-16 NOTE — Discharge Instructions (Addendum)
We gave you Toradol for pain here Limited x rays showed some degenerative changes and you may have a bulging disc in the lower back  Naproxen, tylenol and refilled the tramadol for pain.  Continue the muscle relaxant and prednisone  Contact given for neurosurgery for follow-up.  If symptoms become more severe throughout the weekend you need to go straight to the ER.

## 2020-01-16 NOTE — ED Triage Notes (Signed)
Patient presents to Urgent Care with complaints of follow up for lower back pain since she was seen here three days ago. Patient reports she was told to return if her back pain was not improved and x-rays would be done.

## 2020-01-26 ENCOUNTER — Emergency Department (HOSPITAL_COMMUNITY): Payer: BLUE CROSS/BLUE SHIELD

## 2020-01-26 ENCOUNTER — Encounter (HOSPITAL_COMMUNITY): Payer: Self-pay

## 2020-01-26 ENCOUNTER — Telehealth: Payer: BLUE CROSS/BLUE SHIELD

## 2020-01-26 ENCOUNTER — Other Ambulatory Visit: Payer: Self-pay

## 2020-01-26 ENCOUNTER — Emergency Department (HOSPITAL_COMMUNITY)
Admission: EM | Admit: 2020-01-26 | Discharge: 2020-01-26 | Disposition: A | Discharge status: Home / Self Care | Payer: BLUE CROSS/BLUE SHIELD | Attending: Emergency Medicine | Admitting: Emergency Medicine

## 2020-01-26 DIAGNOSIS — J45909 Unspecified asthma, uncomplicated: Secondary | ICD-10-CM | POA: Diagnosis not present

## 2020-01-26 DIAGNOSIS — R202 Paresthesia of skin: Secondary | ICD-10-CM | POA: Diagnosis not present

## 2020-01-26 DIAGNOSIS — Z79899 Other long term (current) drug therapy: Secondary | ICD-10-CM | POA: Insufficient documentation

## 2020-01-26 DIAGNOSIS — M5442 Lumbago with sciatica, left side: Secondary | ICD-10-CM | POA: Insufficient documentation

## 2020-01-26 DIAGNOSIS — Z87891 Personal history of nicotine dependence: Secondary | ICD-10-CM | POA: Insufficient documentation

## 2020-01-26 DIAGNOSIS — M545 Low back pain: Secondary | ICD-10-CM | POA: Diagnosis present

## 2020-01-26 DIAGNOSIS — M5441 Lumbago with sciatica, right side: Secondary | ICD-10-CM | POA: Diagnosis not present

## 2020-01-26 MED ORDER — METHOCARBAMOL 1000 MG/10ML IJ SOLN
1000.0000 mg | Freq: Once | INTRAVENOUS | Status: AC
  Administered 2020-01-26: 06:00:00 1000 mg via INTRAVENOUS
  Filled 2020-01-26: qty 10

## 2020-01-26 MED ORDER — KETOROLAC TROMETHAMINE 30 MG/ML IJ SOLN
30.0000 mg | Freq: Once | INTRAMUSCULAR | Status: AC
  Administered 2020-01-26: 30 mg via INTRAVENOUS
  Filled 2020-01-26: qty 1

## 2020-01-26 MED ORDER — LORAZEPAM 2 MG/ML IJ SOLN
1.0000 mg | Freq: Once | INTRAMUSCULAR | Status: AC
  Administered 2020-01-26: 1 mg via INTRAVENOUS
  Filled 2020-01-26: qty 1

## 2020-01-26 MED ORDER — MORPHINE SULFATE (PF) 4 MG/ML IV SOLN
6.0000 mg | Freq: Once | INTRAVENOUS | Status: AC
  Administered 2020-01-26: 06:00:00 6 mg via INTRAVENOUS
  Filled 2020-01-26: qty 2

## 2020-01-26 MED ORDER — CYCLOBENZAPRINE HCL 10 MG PO TABS: 10.0000 mg | ORAL_TABLET | Freq: Two times a day (BID) | ORAL | 0 refills | Status: AC | PRN

## 2020-01-26 MED ORDER — METHOCARBAMOL 1000 MG/10ML IJ SOLN: 1000.0000 mg | Freq: Once | INTRAMUSCULAR | Status: DC

## 2020-01-26 NOTE — ED Triage Notes (Signed)
Per EMS, patient c/o 10/10 lower back pain. 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 Patient went to hospital and imaging showed a fracture to  lower spinal processes. Patient denies any Covid-19 exposure and had her first round of the vaccine a few days ago. Patient from home and is A&Ox4, but because of her pain she is unable to ambulate at the time.

## 2020-01-26 NOTE — Discharge Instructions (Addendum)
We discussed the results of you MRI Lumbar spine. You will need to obtain follow up via primary care with a back specialist.   I have prescribed flexeril to continue help it with your muscle spasms.

## 2020-01-26 NOTE — ED Notes (Signed)
Patient transported to MRI 

## 2020-01-26 NOTE — ED Provider Notes (Signed)
[facility] DEPT Provider Note   CSN: [id number] Arrival date & time: 01/26/20  0419     History Chief Complaint  Patient presents with  . Back Pain    Nicole [name] is a 44 y.o. female.  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 No history of back problems. No urinary/bowel dysfunction. She reports tingling into both feet and pain with movement of bilateral LE's without weakness. No abdominal pain. She has been seen 2/3 and 2/5 at Urgent Care for evaluation of pain. She had a plain film x-ray that was negative for fracture and has been prescribed prednisone, Flexeril and Ultram which she is taking without relief. She describes pain that intermittently becomes sharp and stabbing.   The history is provided by the patient. No language interpreter was used.  Back Pain Associated symptoms: no abdominal pain, no fever and no numbness        Past Medical History:  Diagnosis Date  . Anemia   . Asthma   . Atypical chest pain    a. ?pericarditis 2015.  Marland Kitchen Blood transfusion 2011   r/t anemia  . Fibroid   . Migraine   . Seizures ([place])   . Sickle cell trait ([place])   . Stroke ([place])   . Tooth abscess     Patient Active Problem List   Diagnosis Date Noted  . 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 Depression   . Generalized abdominal pain   . Fatigue 10/28/2015  . Adjustment disorder with depressed mood 10/21/2015  . Dizziness   . Weakness 11/18/2014  . OSA (obstructive sleep apnea) 10/21/2014  . Acute rhinosinusitis 10/09/2014  . Leg pain, lateral 09/04/2014  . Migraines 08/13/2014  . Seizure ([place]) 06/04/2014  . Leiomyoma of uterus, unspecified 02/11/2014  . GERD (gastroesophageal reflux disease) 01/28/2014  . Candidiasis of vulva and vagina 01/28/2014  . Unspecified symptom associated with female genital organs 01/28/2014  .  Chest pain 01/20/2014  . Acute pericarditis, unspecified 01/20/2014    Past Surgical History:  Procedure Laterality Date  . TUBAL LIGATION Bilateral 2005     OB History    Gravida  3   Para  2   Term  1   Preterm  1   AB  1   Living  2     SAB  0   TAB  0   Ectopic  1   Multiple  0   Live Births  2           Family History  Problem Relation Age of Onset  . Hypertension Mother   . Diabetes Mother   . Cancer Mother        Colon, brain, 2 other  . Asthma Mother   . Clotting disorder Mother   . Heart disease Mother        34, stents  . Stroke Mother        2012  . Diabetes Father   . Asthma Father   . Clotting disorder Father   . Sickle cell anemia Father   . Heart disease Father        2000, stents  . Hypertension Father   . Stroke Father        2007  . Diabetes Maternal Grandmother   . Asthma Sister   . Asthma Brother   .  Asthma Paternal Grandmother   . Anesthesia problems Neg Hx     Social History   Tobacco Use  . Smoking status: Former Smoker    Quit date: 03/27/2014    Years since quitting: 5.8  . Smokeless tobacco: Never Used  Substance Use Topics  . Alcohol use: No  . Drug use: No    Home Medications Prior to Admission medications   Medication Sig Start Date End Date Taking? Authorizing Provider  acetaminophen (TYLENOL) 500 MG tablet Take 1 tablet (500 mg total) by mouth every 6 (six) hours as needed. 01/16/20   Orvan July, NP  ALAYCEN 1/35 tablet TAKE 1 TABLET BY MOUTH EVERY DAY 08/07/19   Shelly Bombard, MD  cetirizine (ZYRTEC) 10 MG tablet Take 10 mg by mouth daily.    [provider]  cyclobenzaprine (FLEXERIL) 10 MG tablet Take 1 tablet (10 mg total) by mouth 2 (two) times daily as needed for muscle spasms. 01/14/20   Loura Halt A, NP  EPINEPHrine (EPIPEN 2-PAK) 0.3 mg/0.3 mL IJ SOAJ injection Inject 0.3 mg into the muscle as needed (for allergic reaction).    [provider]  famotidine (PEPCID) 20 MG  tablet Take 1 tablet (20 mg total) by mouth daily. 11/13/19   Tasia Catchings, Amy V, PA-C  Fe Cbn-Fe Gluc-FA-B12-C-DSS (FERRALET 90) 90-1 MG TABS Take 1 tablet by mouth daily before breakfast. 01/08/19   Shelly Bombard, MD  ferrous sulfate 325 (65 FE) MG tablet Take 1 tablet (325 mg total) by mouth daily. 12/29/18   Petrucelli, Samantha R, PA-C  ipratropium (ATROVENT) 0.06 % nasal spray Place 2 sprays into both nostrils 4 (four) times daily. 11/13/19   Tasia Catchings, Amy V, PA-C  levETIRAcetam (KEPPRA) 1000 MG tablet Take 1 tablet (1,000 mg total) by mouth 2 (two) times daily for 10 days. 12/15/19 12/25/19  Ok Edwards, PA-C  meclizine (ANTIVERT) 25 MG tablet Take 1 tablet (25 mg total) by mouth 3 (three) times daily as needed for dizziness. 12/15/19   Tasia Catchings, Amy V, PA-C  medroxyPROGESTERone (DEPO-PROVERA) 150 MG/ML injection Inject 1 mL (150 mg total) into the muscle every 3 (three) months. 01/08/19   Shelly Bombard, MD  montelukast (SINGULAIR) 10 MG tablet Take 1 tablet (10 mg total) by mouth at bedtime. 07/03/19   Cirigliano, Garvin Fila, DO  naproxen (NAPROSYN) 500 MG tablet Take 1 tablet (500 mg total) by mouth 2 (two) times daily with a meal. 01/16/20   Bast, Traci A, NP  predniSONE (STERAPRED UNI-PAK 21 TAB) 10 MG (21) TBPK tablet 6 tabs for 1 day, then 5 tabs for 1 das, then 4 tabs for 1 day, then 3 tabs for 1 day, 2 tabs for 1 day, then 1 tab for 1 day 01/14/20   Loura Halt A, NP  prenatal vitamin w/FE, FA (PRENATAL 1 + 1) 27-1 MG TABS tablet Take 1 tablet by mouth daily before breakfast. 01/08/19   Shelly Bombard, MD  topiramate (TOPAMAX) 25 MG tablet Take 3 tablets (75 mg total) by mouth 2 (two) times daily. 07/07/18   Molpus, John, MD  traMADol (ULTRAM) 50 MG tablet Take 1 tablet (50 mg total) by mouth every 6 (six) hours as needed. 01/16/20   Orvan July, NP    Allergies    Penicillins, Shrimp [shellfish allergy], Sulfa antibiotics, and Tea  Review of Systems   Review of Systems  Constitutional: Negative for chills and  fever.  Respiratory: Negative.   Cardiovascular: Negative.  Gastrointestinal: Negative.  Negative for abdominal pain, nausea and vomiting.  Genitourinary: Negative for enuresis.  Musculoskeletal: Positive for back pain.  Skin: Negative.   Neurological: Negative for numbness.       See HPI.    Physical Exam Updated Vital Signs BP (!) 153/94 (BP Location: Right Arm)   Pulse 89   Temp 98.4 F (36.9 C) (Oral)   Resp 17   Ht 5\' 5"  (1.651 m)   Wt 107 kg   SpO2 96%   BMI 39.27 kg/m   Physical Exam Constitutional:      Appearance: She is well-developed.  Pulmonary:     Effort: Pulmonary effort is normal.  Abdominal:     Tenderness: There is no abdominal tenderness.  Musculoskeletal:     Cervical back: Normal range of motion.     Comments: Midline lumbar tenderness with bilateral paralumbar tenderness without perceived swelling, redness. Pain with ROM of LE's bilaterally.   Skin:    General: Skin is warm and dry.  Neurological:     Mental Status: She is alert and oriented to person, place, and time.     Sensory: No sensory deficit.     Coordination: Coordination normal.     Comments: No reflexes illicited however exam may be limited due to obesity.     ED Results / Procedures / Treatments   Labs (all labs ordered are listed, but only abnormal results are displayed) Labs Reviewed - No data to display  EKG None  Radiology No results found.  Procedures Procedures (including critical care time)  Medications Ordered in ED Medications - No data to display  ED Course  I have reviewed the triage vital signs and the nursing notes.  Pertinent labs & imaging results that were available during my care of the patient were reviewed by me and considered in my medical decision making (see chart for details).    MDM Rules/Calculators/A&P                      Patient to ED with low back pain, bilateral foot paresthesias as detailed in the HPI. Ongoing severe pain since 2/2  injury.   She has tried medications, rest. Has had a negative x-ray. Given duration of symptoms, report of paresthesias, will obtain MRI lumbar spine at 7:00 when available, provide pain medication and reassess.   No neurologic red flags on exam with reflexes difficult to obtain.  6:15 - MRI tech here to transport already. Ativan provided for claustrophobia. Morphine given and is providing some relief.   Patient care signed out to [facility] for review of MRI, reassessment, ambulation after MRI.  Final Clinical Impression(s) / ED Diagnoses Final diagnoses:  None   1. Low back pain 2. Bilateral LE paresthesia  Rx / DC Orders ED Discharge Orders    None       Dennie Bible [id number]    Delora Fuel, MD [id number] [phone]

## 2020-01-26 NOTE — ED Provider Notes (Signed)
  Physical Exam  BP 127/83 (BP Location: Right Arm)   Pulse 99   Temp 98.5 F (36.9 C) (Oral)   Resp 18   Ht 5\' 5"  (1.651 m)   Wt 107 kg   SpO2 99%   BMI 39.27 kg/m   Physical Exam  ED Course/Procedures     Procedures  MDM  Patient care assumed from [place]. PA at shift change, please see her note for a full HPI. Briefly, patient here via EMS with severe low back pain since 01/12/2019 after having her patient falls on her about 360 lbs. She waas seen originally at [facility] 2/3 and 2/5 has attempted meds without much improvement. She is pending MRI Lumbar spine to evaluate, will dispo accordingly.  Exam her prior colleague without any red flags, but does report BL paresthesias.   BP (!) 136/92   Pulse 86   Temp 98.4 F (36.9 C) (Oral)   Resp 18   Ht 5\' 5"  (1.651 m)   Wt 107 kg   SpO2 98%   BMI 39.27 kg/m   MRI lumbar spine showed: 1. No specific posttraumatic finding.  2. Noncompressive disc protrusions at L2-3, L3-4, and L4-5.  3. Over distended bladder which may explain right  hydroureteronephrosis. Suggest follow-up renal ultrasound after  bladder drainage.  4. Small uterine fibroids.      7:38 AM Patient reassessed by me, informed of her MRI results.  She reports improvement in her symptoms. Patient is sedated due to medication. Will attempt to ambulate along with feeding prior to disposition.   9:14 AM Patient has ambulated without any assistance from bedroom to the bathroom.  She will be refilled short course of muscle relaxers.  PCP follow-up is encouraged.  Return precautions discussed at length.    Portions of this note were generated with [employer]. Dictation errors may occur despite best attempts at proofreading.         Janeece Fitting, PA-C [phone]    Delora Fuel, MD [id number]

## 2020-01-26 NOTE — ED Notes (Signed)
Pt ambulate from room to the restroom with out any help and back to the the pt room, done well

## 2020-03-03 ENCOUNTER — Other Ambulatory Visit: Payer: Self-pay | Admitting: Obstetrics

## 2020-03-03 DIAGNOSIS — N939 Abnormal uterine and vaginal bleeding, unspecified: Secondary | ICD-10-CM

## 2020-03-12 IMAGING — CR DG KNEE COMPLETE 4+V*R*
4 series · 4 of 4 positions shown · non-contrast
Comparison: None.

CLINICAL DATA: Right knee pain, fall

EXAM:
RIGHT KNEE - COMPLETE 4+ VIEW

[t knee ap right]
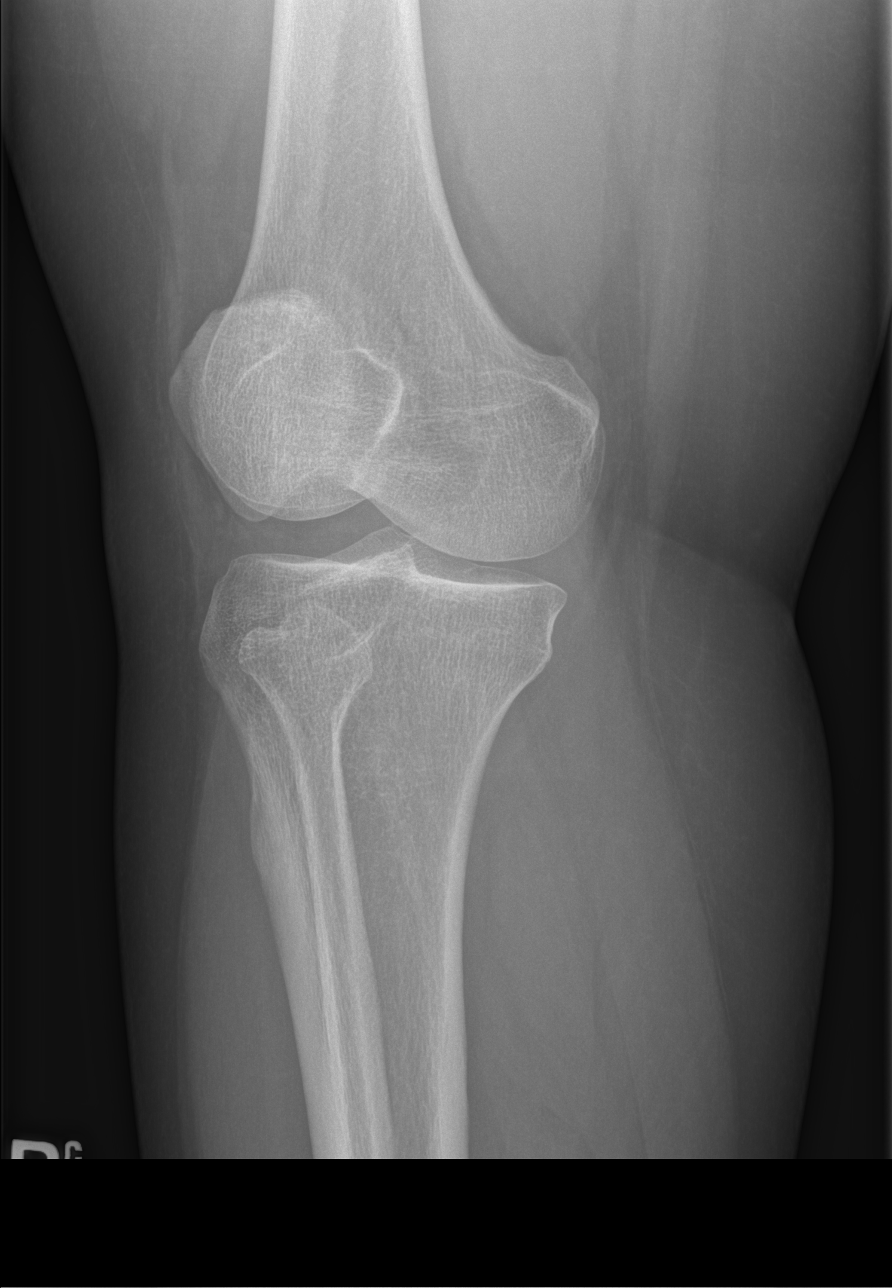

[t knee obl right (1 of 2)]
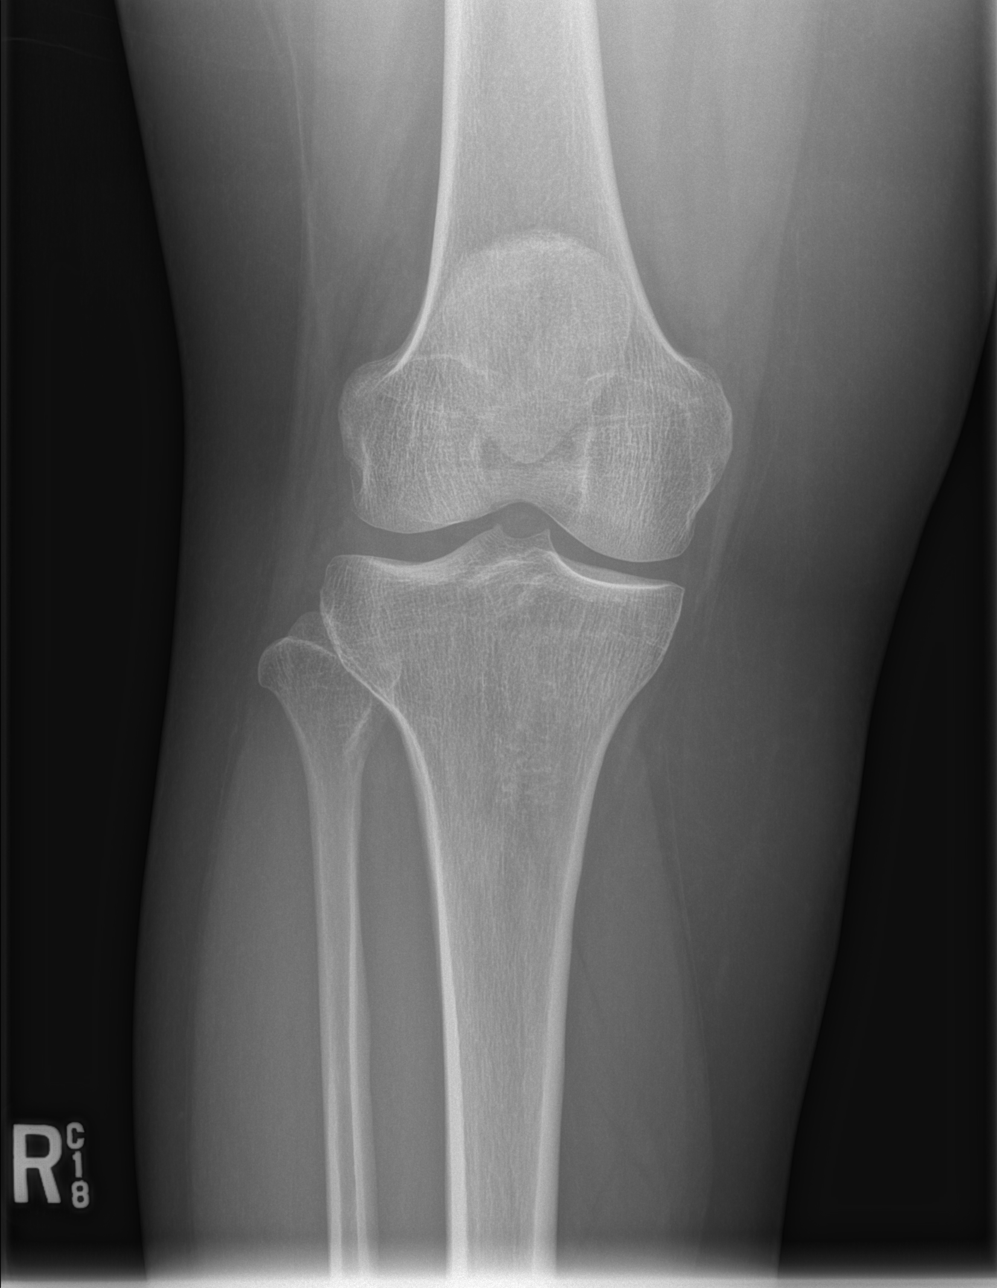

[t knee obl right (2 of 2)]
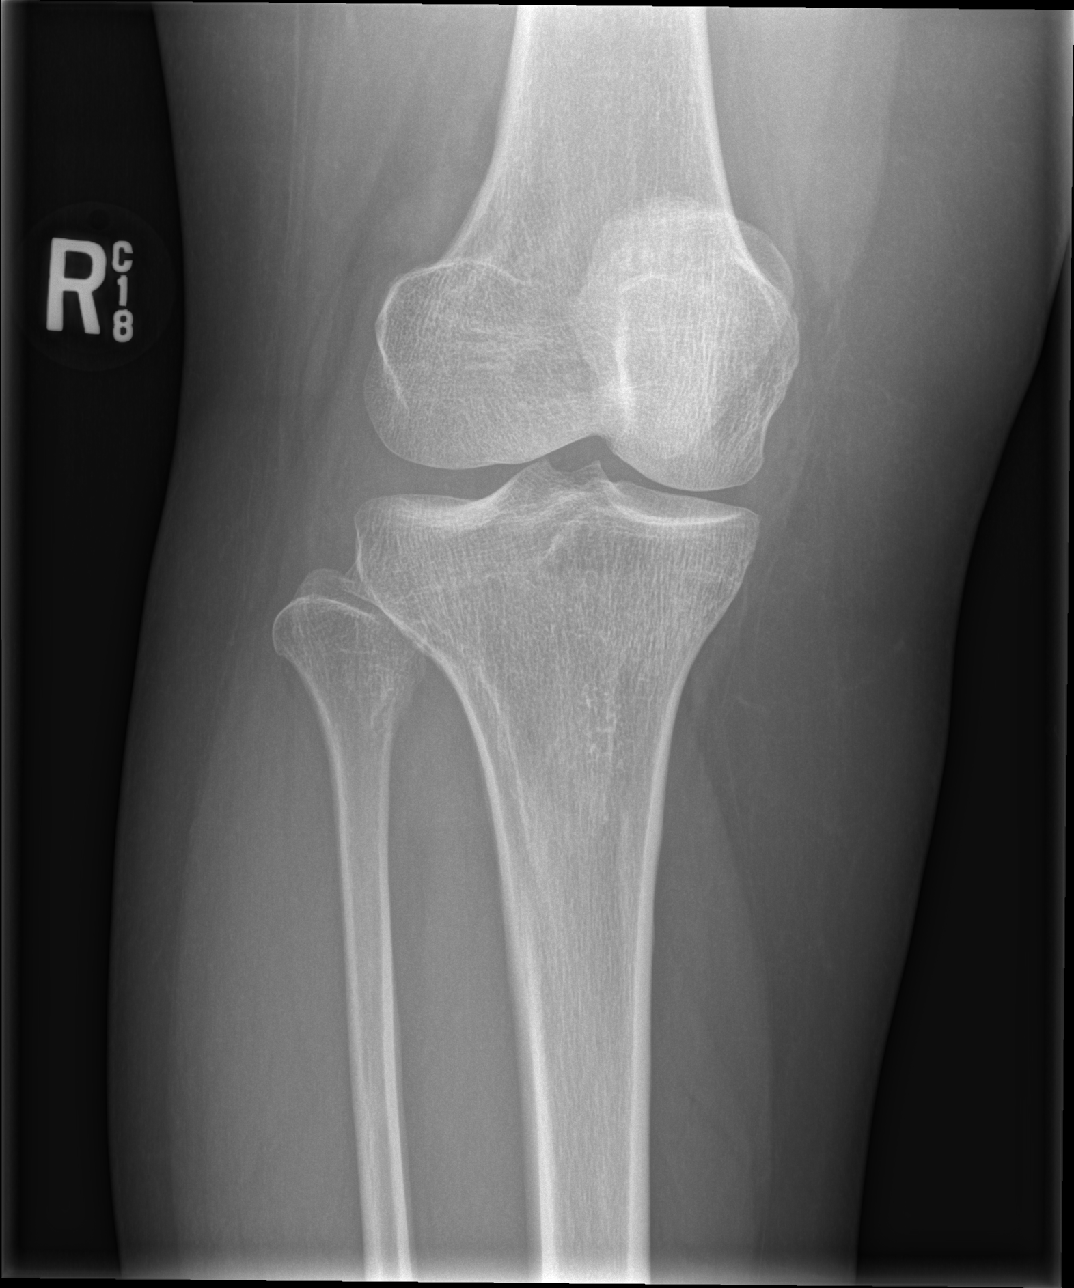

[t knee lat right]
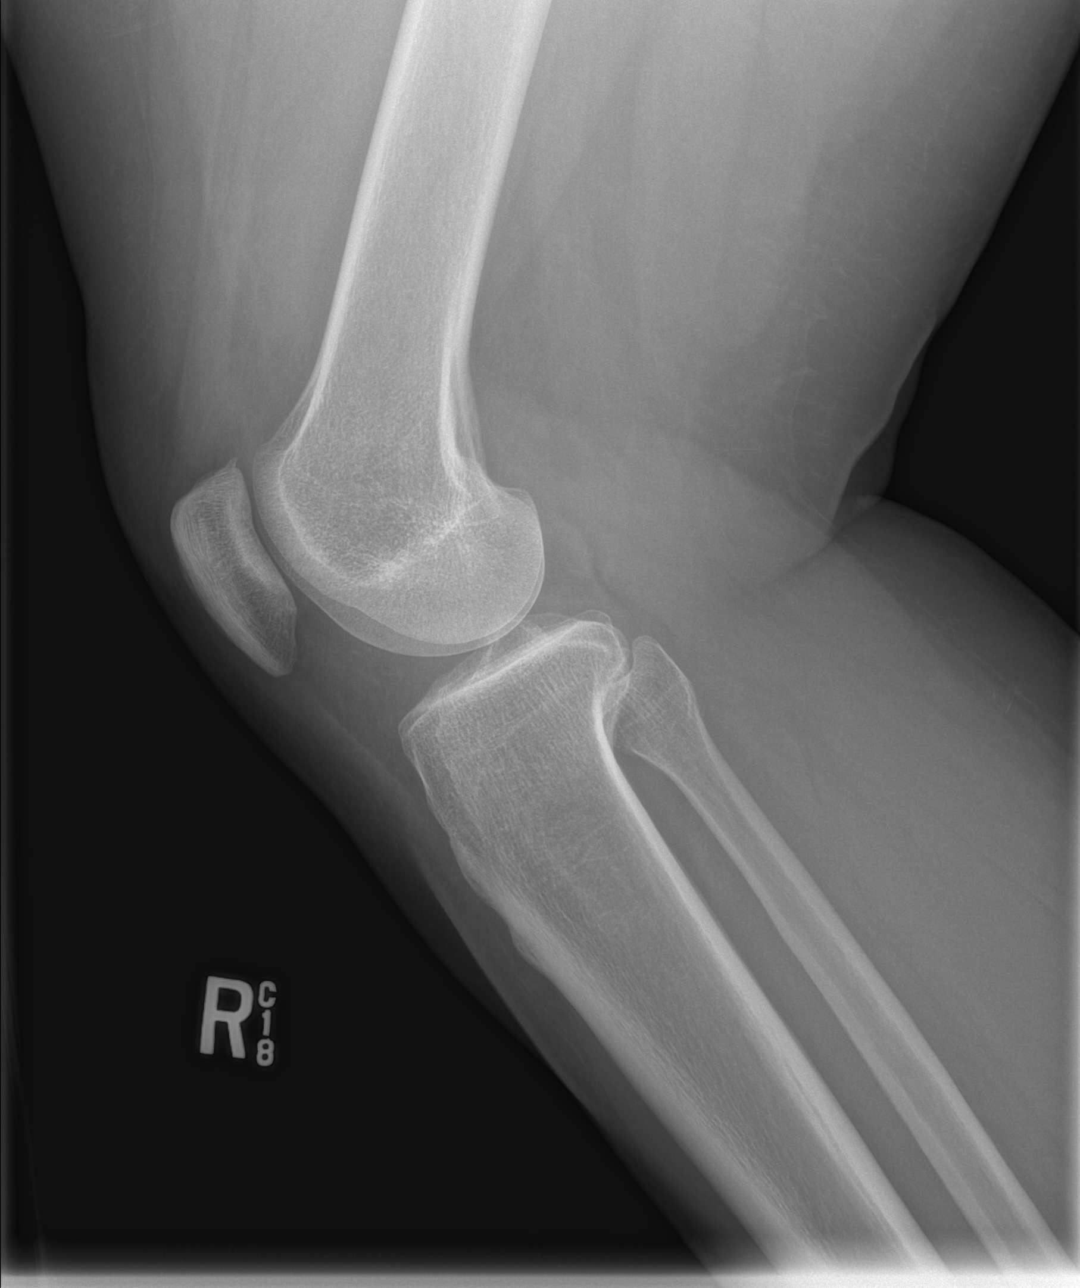

[4 of 4 positions shown; findings below may reference images not displayed]

FINDINGS: Early joint space narrowing and spurring in the patellofemoral
compartment. No significant joint effusion. No acute bony
abnormality. Specifically, no fracture, subluxation, or dislocation.
IMPRESSION: Early osteoarthritis in the patellofemoral compartment. No acute
bony abnormality.

## 2020-03-12 IMAGING — CR DG HIP (WITH OR WITHOUT PELVIS) 2-3V*R*
3 series · 3 of 3 positions shown · non-contrast
Comparison: None.

CLINICAL DATA: Right hip pain status post fall

EXAM:
DG HIP (WITH OR WITHOUT PELVIS) 2-3V RIGHT

[t pelvis ap]
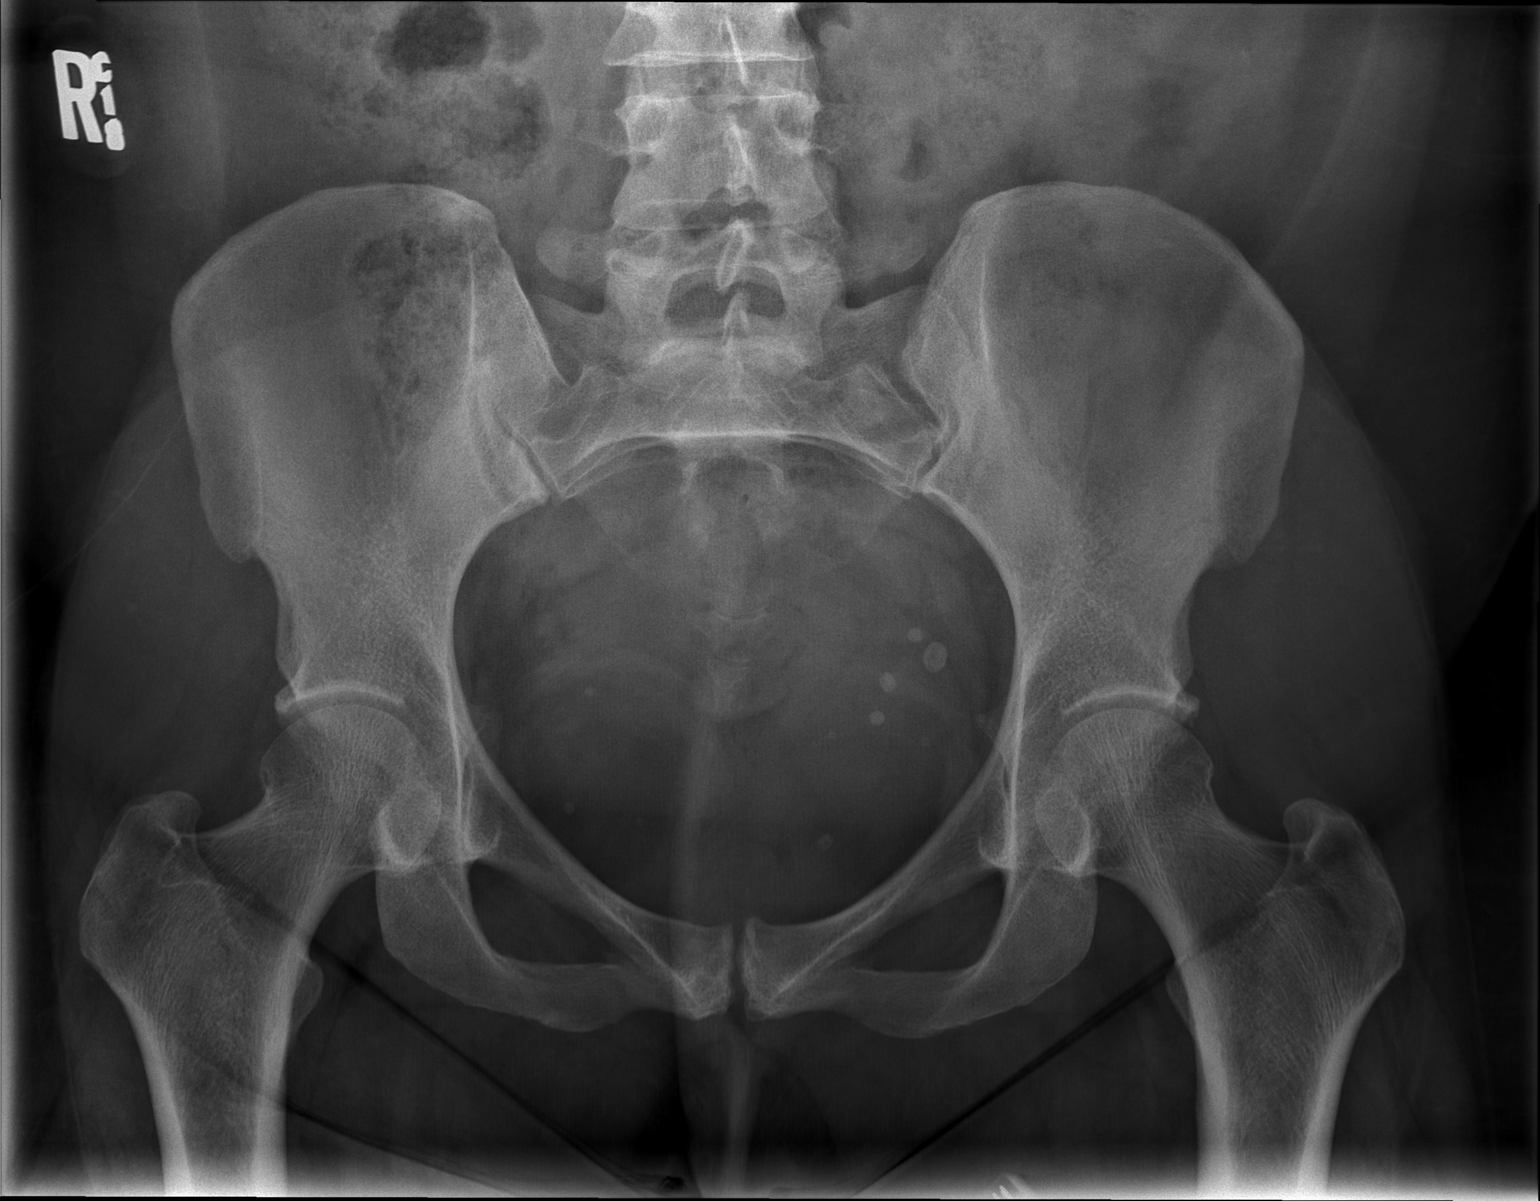

[t hip ap right]
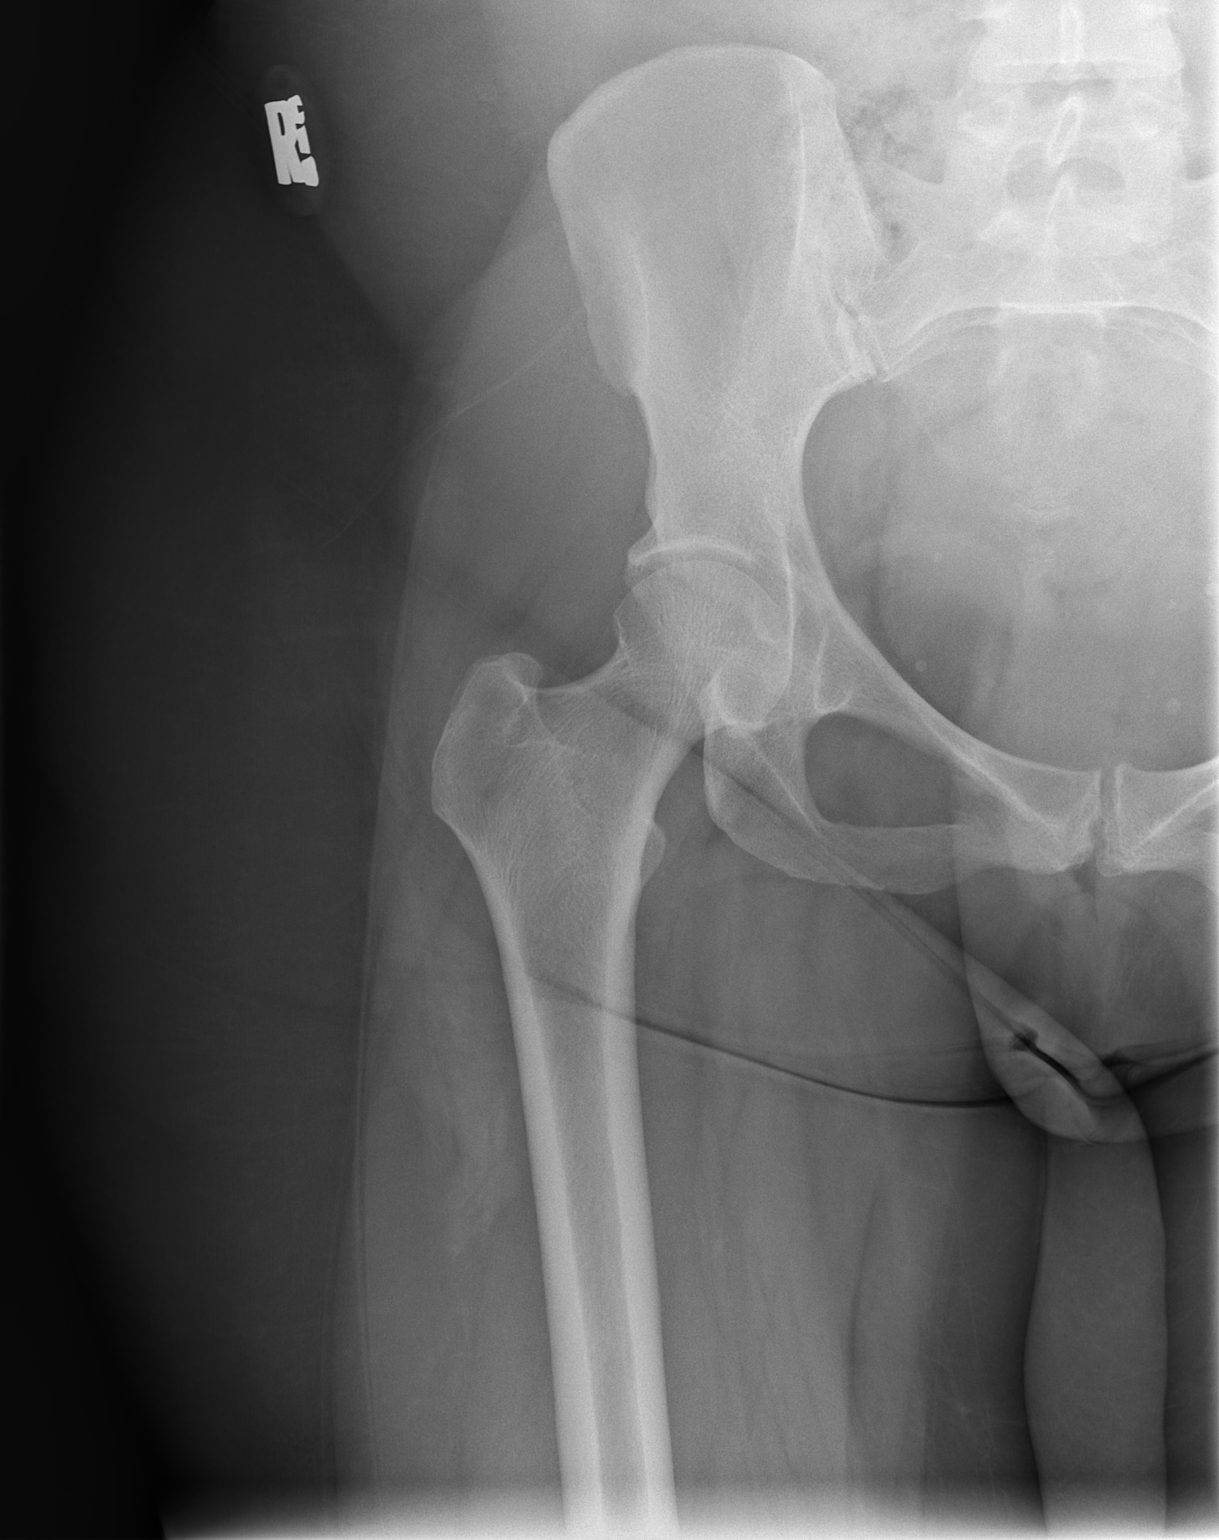

[t hip frog leg right]
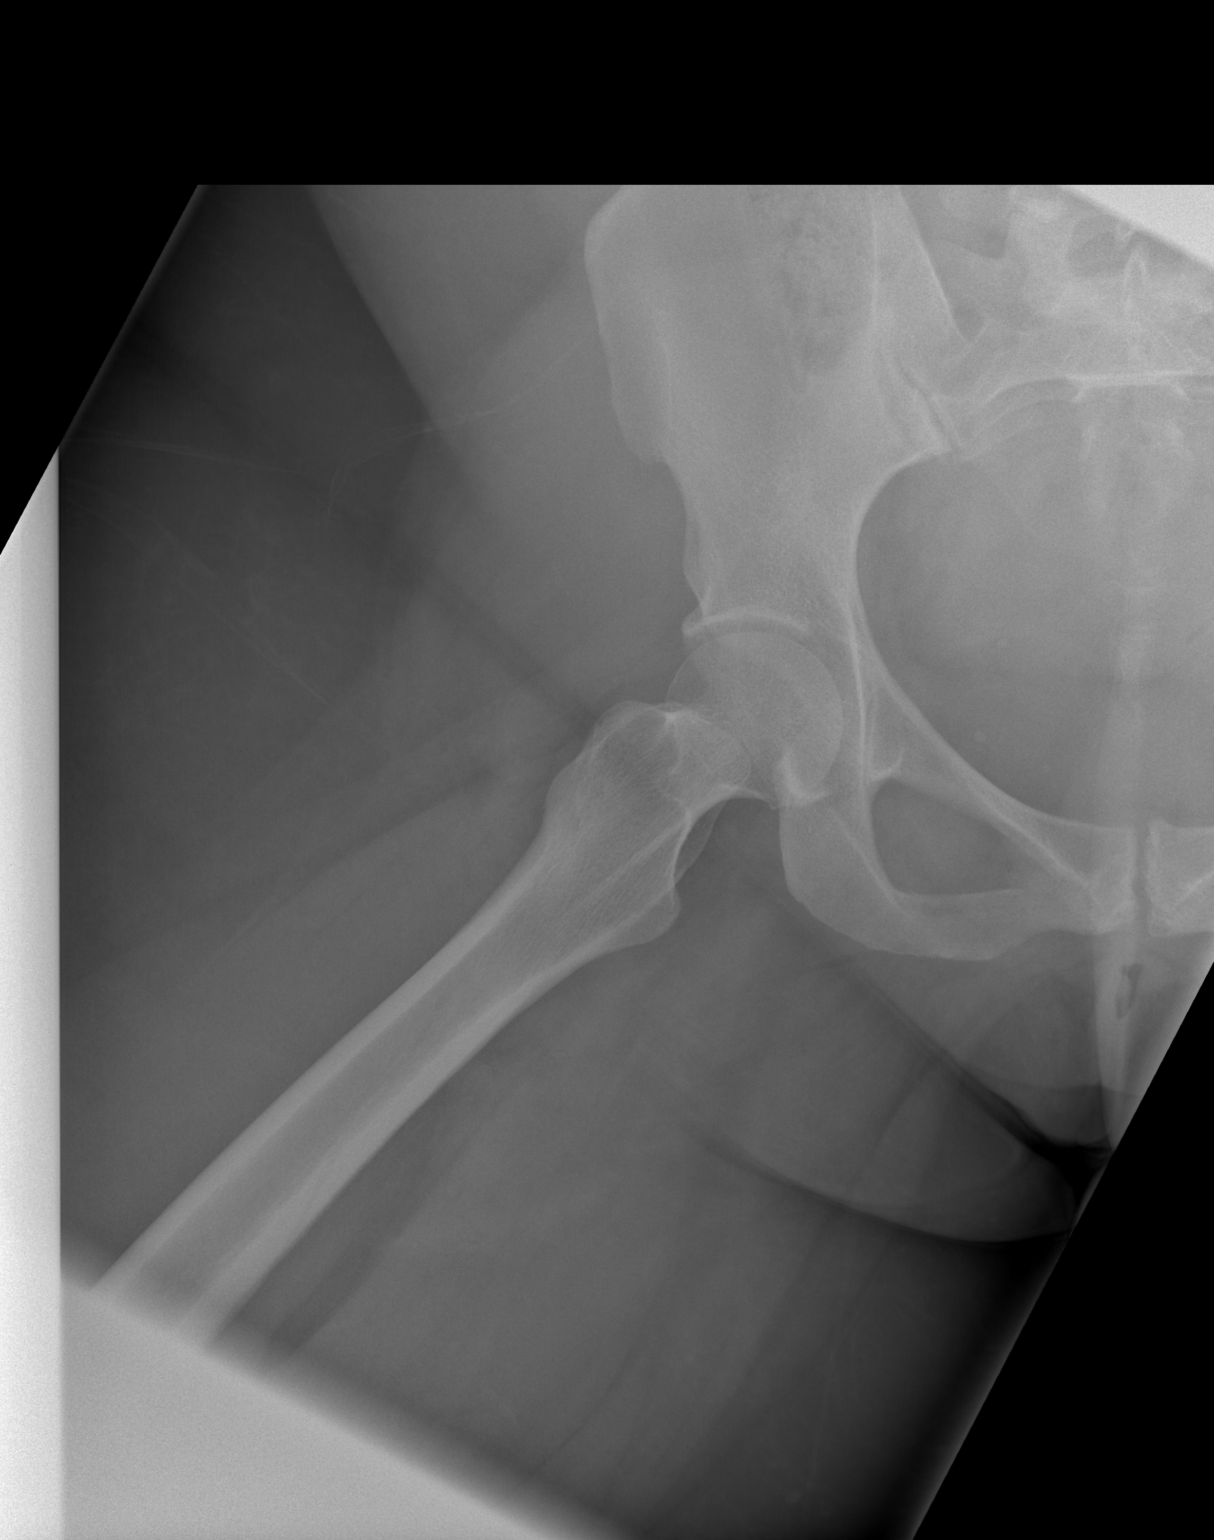

[3 of 3 positions shown; findings below may reference images not displayed]

FINDINGS: There is no evidence of hip fracture or dislocation. There is no
evidence of arthropathy or other focal bone abnormality.
IMPRESSION: Negative.

## 2020-08-08 ENCOUNTER — Encounter (HOSPITAL_COMMUNITY): Payer: Self-pay

## 2020-08-08 ENCOUNTER — Ambulatory Visit (INDEPENDENT_AMBULATORY_CARE_PROVIDER_SITE_OTHER): Payer: BLUE CROSS/BLUE SHIELD

## 2020-08-08 ENCOUNTER — Other Ambulatory Visit: Payer: Self-pay

## 2020-08-08 ENCOUNTER — Ambulatory Visit (HOSPITAL_COMMUNITY)
Admission: EM | Admit: 2020-08-08 | Discharge: 2020-08-08 | Disposition: A | Discharge status: Home / Self Care | Payer: BLUE CROSS/BLUE SHIELD | Attending: Urgent Care | Admitting: Urgent Care

## 2020-08-08 DIAGNOSIS — S99922A Unspecified injury of left foot, initial encounter: Secondary | ICD-10-CM

## 2020-08-08 DIAGNOSIS — S90112A Contusion of left great toe without damage to nail, initial encounter: Secondary | ICD-10-CM | POA: Diagnosis not present

## 2020-08-08 DIAGNOSIS — M79675 Pain in left toe(s): Secondary | ICD-10-CM

## 2020-08-08 MED ORDER — NAPROXEN 500 MG PO TABS: 500.0000 mg | ORAL_TABLET | Freq: Two times a day (BID) | ORAL | 0 refills | Status: DC

## 2020-08-08 NOTE — Discharge Instructions (Addendum)
I will manage her current injury for a great toe contusion.  There is no sign of fracture on the images as I see them.  Wear the postop shoe for this next week.  Use naproxen for pain and inflammation.

## 2020-08-08 NOTE — ED Provider Notes (Signed)
[facility]   MRN: [id number] DOB: 1976-02-24  Subjective:   Nicole [name] is a 44 y.o. female presenting for left great toe injury, pain from dropping a heavy box on it today. Has not taken any medications for the pain.   No current facility-administered medications for this encounter.  Current Outpatient Medications:  .  acetaminophen (TYLENOL) 500 MG tablet, Take 1 tablet (500 mg total) by mouth every 6 (six) hours as needed. (Patient taking differently: Take 500 mg by mouth every 6 (six) hours as needed for mild pain or headache. ), Disp: 30 tablet, Rfl: 0 .  ALAYCEN 1/35 tablet, TAKE 1 TABLET BY MOUTH EVERY DAY (Patient not taking: Reported on 01/26/2020), Disp: 28 tablet, Rfl: 2 .  EPINEPHrine (EPIPEN 2-PAK) 0.3 mg/0.3 mL IJ SOAJ injection, Inject 0.3 mg into the muscle as needed (for allergic reaction)., Disp: , Rfl:  .  famotidine (PEPCID) 20 MG tablet, Take 1 tablet (20 mg total) by mouth daily. (Patient not taking: Reported on 01/26/2020), Disp: 15 tablet, Rfl: 0 .  Fe Cbn-Fe Gluc-FA-B12-C-DSS (FERRALET 90) 90-1 MG TABS, Take 1 tablet by mouth daily before breakfast. (Patient not taking: Reported on 01/26/2020), Disp: 30 each, Rfl: 11 .  ferrous sulfate 325 (65 FE) MG tablet, Take 1 tablet (325 mg total) by mouth daily. (Patient not taking: Reported on 01/26/2020), Disp: 30 tablet, Rfl: 0 .  ipratropium (ATROVENT) 0.06 % nasal spray, Place 2 sprays into both nostrils 4 (four) times daily. (Patient not taking: Reported on 01/26/2020), Disp: 15 mL, Rfl: 0 .  levETIRAcetam (KEPPRA) 1000 MG tablet, Take 1 tablet (1,000 mg total) by mouth 2 (two) times daily for 10 days. (Patient taking differently: Take 1,000 mg by mouth 3 (three) times daily. ), Disp: 20 tablet, Rfl: 0 .  meclizine (ANTIVERT) 25 MG tablet, Take 1 tablet (25 mg total) by mouth 3 (three) times daily as needed for dizziness., Disp: 15 tablet, Rfl: 0 .  medroxyPROGESTERone (DEPO-PROVERA) 150 MG/ML injection, INJECT 1 ML  (150 MG TOTAL) INTO THE MUSCLE EVERY 3 (THREE) MONTHS., Disp: 1 mL, Rfl: 4 .  montelukast (SINGULAIR) 10 MG tablet, Take 1 tablet (10 mg total) by mouth at bedtime. (Patient not taking: Reported on 01/26/2020), Disp: 90 tablet, Rfl: 3 .  naproxen (NAPROSYN) 500 MG tablet, Take 1 tablet (500 mg total) by mouth 2 (two) times daily with a meal., Disp: 60 tablet, Rfl: 5 .  predniSONE (STERAPRED UNI-PAK 21 TAB) 10 MG (21) TBPK tablet, 6 tabs for 1 day, then 5 tabs for 1 das, then 4 tabs for 1 day, then 3 tabs for 1 day, 2 tabs for 1 day, then 1 tab for 1 day, Disp: 21 tablet, Rfl: 0 .  prenatal vitamin w/FE, FA (PRENATAL 1 + 1) 27-1 MG TABS tablet, Take 1 tablet by mouth daily before breakfast. (Patient not taking: Reported on 01/26/2020), Disp: 30 each, Rfl: 11 .  topiramate (TOPAMAX) 25 MG tablet, Take 3 tablets (75 mg total) by mouth 2 (two) times daily., Disp: 180 tablet, Rfl: 0 .  traMADol (ULTRAM) 50 MG tablet, Take 1 tablet (50 mg total) by mouth every 6 (six) hours as needed. (Patient taking differently: Take 50 mg by mouth every 6 (six) hours as needed for moderate pain. ), Disp: 15 tablet, Rfl: 0   Allergies  Allergen Reactions  . Penicillins Anaphylaxis and Swelling    Has patient had a PCN reaction causing immediate rash, facial/tongue/throat swelling, SOB or lightheadedness with hypotension: Yes Has  patient had a PCN reaction causing severe rash involving mucus membranes or skin necrosis: Yes Has patient had a PCN reaction that required hospitalization Yes- ed visit Has patient had a PCN reaction occurring within the last 10 years: No-childhood allergy If all of the above answers are "NO", then may proceed with Cephalosporin use.   . Shrimp [Shellfish Allergy] Anaphylaxis  . Sulfa Antibiotics Anaphylaxis, Swelling and Rash  . Tea Anaphylaxis    All teas    Past Medical History:  Diagnosis Date  . Anemia   . Asthma   . Atypical chest pain    a. ?pericarditis 2015.  Marland Kitchen Blood  transfusion 2011   r/t anemia  . Fibroid   . Migraine   . Seizures ([place])   . Sickle cell trait ([place])   . Stroke ([place])   . Tooth abscess      Past Surgical History:  Procedure Laterality Date  . TUBAL LIGATION Bilateral 2005    Family History  Problem Relation Age of Onset  . Hypertension Mother   . Diabetes Mother   . Cancer Mother        Colon, brain, 2 other  . Asthma Mother   . Clotting disorder Mother   . Heart disease Mother        41, stents  . Stroke Mother        2012  . Diabetes Father   . Asthma Father   . Clotting disorder Father   . Sickle cell anemia Father   . Heart disease Father        2000, stents  . Hypertension Father   . Stroke Father        2007  . Diabetes Maternal Grandmother   . Asthma Sister   . Asthma Brother   . Asthma Paternal Grandmother   . Anesthesia problems Neg Hx     Social History   Tobacco Use  . Smoking status: Former Smoker    Quit date: 03/27/2014    Years since quitting: 6.3  . Smokeless tobacco: Never Used  Vaping Use  . Vaping Use: Never used  Substance Use Topics  . Alcohol use: No  . Drug use: No    ROS   Objective:   Vitals: BP 125/82   Pulse 81   Temp 98.7 F (37.1 C) (Oral)   Resp 20   Ht 5\' 5"  (1.651 m)   Wt 265 lb (120.2 kg)   SpO2 100%   BMI 44.10 kg/m   Physical Exam Constitutional:      General: She is not in acute distress.    Appearance: Normal appearance. She is well-developed. She is obese. She is not ill-appearing, toxic-appearing or diaphoretic.  HENT:     Head: Normocephalic and atraumatic.     Right Ear: External ear normal.     Left Ear: External ear normal.     Nose: Nose normal.     Mouth/Throat:     Mouth: Mucous membranes are moist.     Pharynx: Oropharynx is clear.  Eyes:     General: No scleral icterus.       Right eye: No discharge.        Left eye: No discharge.     Extraocular Movements: Extraocular movements intact.     Conjunctiva/sclera: Conjunctivae  normal.     Pupils: Pupils are equal, round, and reactive to light.  Cardiovascular:     Rate and Rhythm: Normal rate.  Pulmonary:     Effort:  Pulmonary effort is normal.  Musculoskeletal:     Left foot: Decreased range of motion (left great toe). Normal capillary refill. Tenderness (left great toe) and bony tenderness present. No swelling, deformity or crepitus.  Skin:    General: Skin is warm and dry.  Neurological:     General: No focal deficit present.     Mental Status: She is alert and oriented to person, place, and time.  Psychiatric:        Mood and Affect: Mood normal.        Behavior: Behavior normal.        Thought Content: Thought content normal.        Judgment: Judgment normal.     DG Toe Great Left  Result Date: 08/08/2020 CLINICAL DATA:  Pt states a big heavy box fell on her 1st left digit of foot today. Pt limped to exam room. Pt is able to slightly wiggle toe due to pain. EXAM: LEFT GREAT TOE COMPARISON:  None. FINDINGS: There is no evidence of fracture or dislocation. There is no evidence of arthropathy or other focal bone abnormality. Soft tissues are unremarkable. IMPRESSION: Negative. Electronically Signed   By: Audie Pinto M.D.   On: 08/08/2020 17:48     Assessment and Plan :   PDMP not reviewed this encounter.  1. Contusion of left great toe without damage to nail, initial encounter   2. Great toe pain, left   3. Toe injury, left, initial encounter     We will manage with naproxen, postop shoe.  Negative x-ray. Counseled patient on potential for adverse effects with medications prescribed/recommended today, ER and return-to-clinic precautions discussed, patient verbalized understanding.    Jaynee Eagles, [place] 08/08/20 1752

## 2020-08-08 NOTE — ED Triage Notes (Signed)
Pt states a big heavy box fell on her 1st left digit of foot today. Pt limped to exam room. Pt is able to slightly wiggle toe due to pain.

## 2020-08-11 IMAGING — CT CT HEAD WITHOUT CONTRAST
5 of 8 series · 16 of 47 positions shown, 17 images · non-contrast
Comparison: None.

CLINICAL DATA: Seizure

EXAM:
CT HEAD WITHOUT CONTRAST
CT CERVICAL SPINE WITHOUT CONTRAST
TECHNIQUE: Multidetector CT imaging of the head and cervical spine was
performed following the standard protocol without intravenous
contrast. Multiplanar CT image reconstructions of the cervical spine
were also generated.

[Series 3: head without · axial · non-contrast · 0.47mm/px · z∈[-70,-20]mm · 2 of 31 slices shown, 3 images]
[im 11/31  brain]
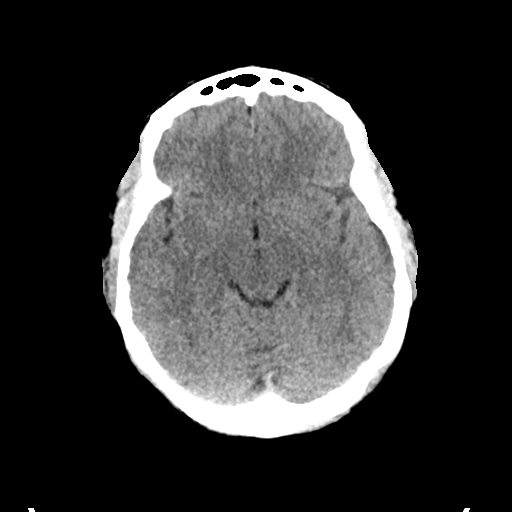
[im 11/31  bone]
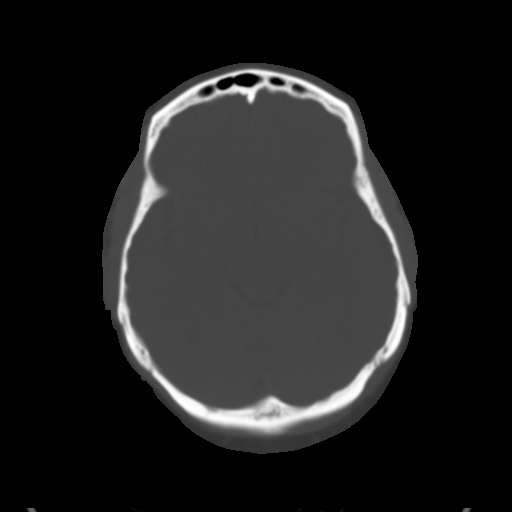
[im 21/31  brain]
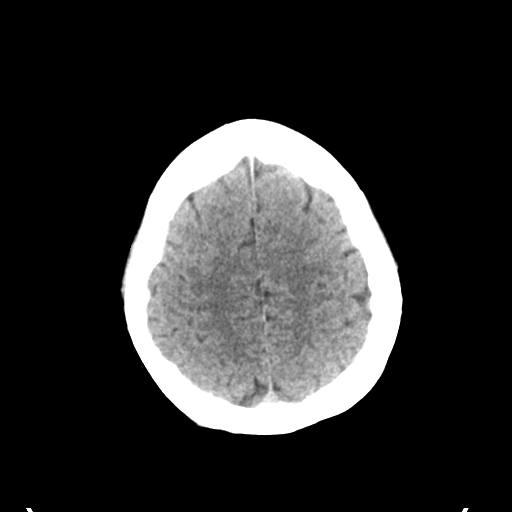

[Series 4: head bone · axial · 0.47mm/px · z∈[-100,+10]mm · 6 of 77 slices shown]
[im 11/77  bone]
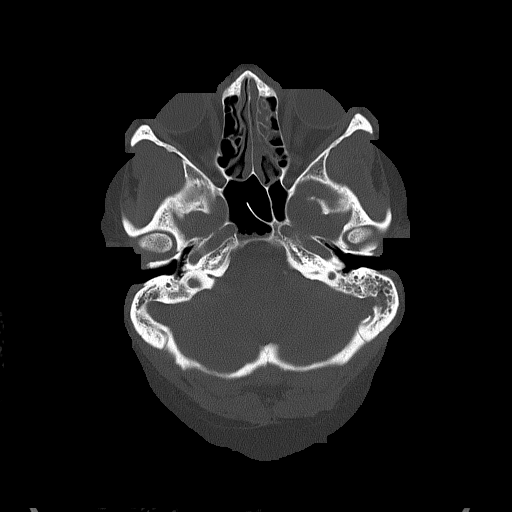
[im 22/77  bone]
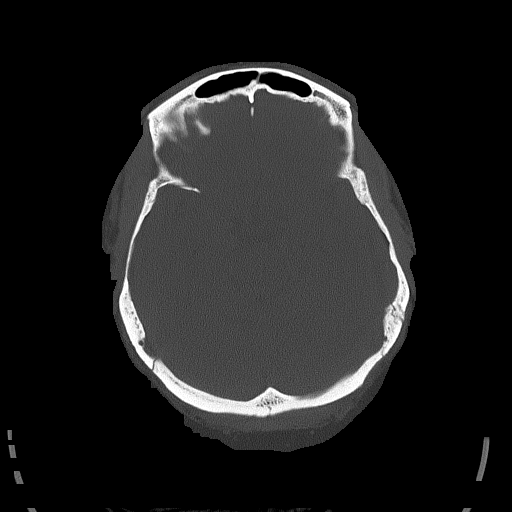
[im 33/77  bone]
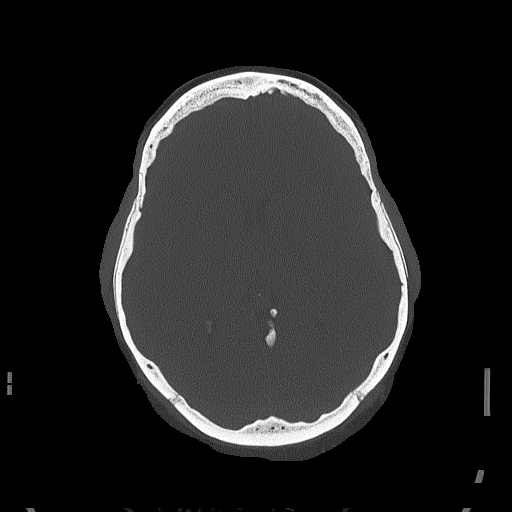
[im 44/77  bone]
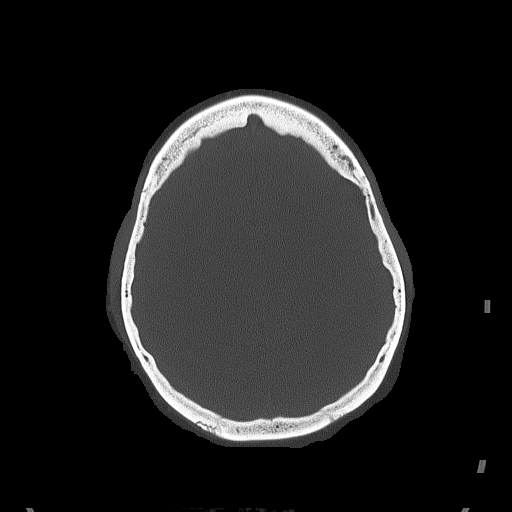
[im 55/77  bone]
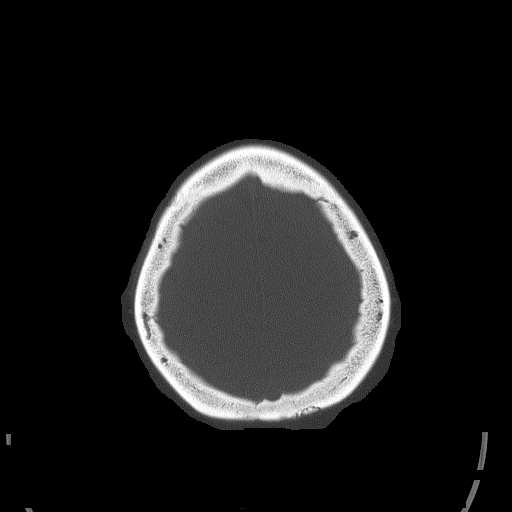
[im 66/77  bone]
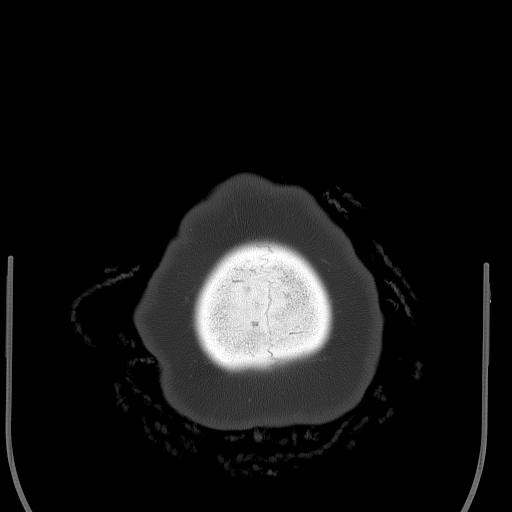

[Series 5: head without cor · coronal · non-contrast · 0.30mm/px · 3 of 67 slices shown]
[im 4/67  brain]
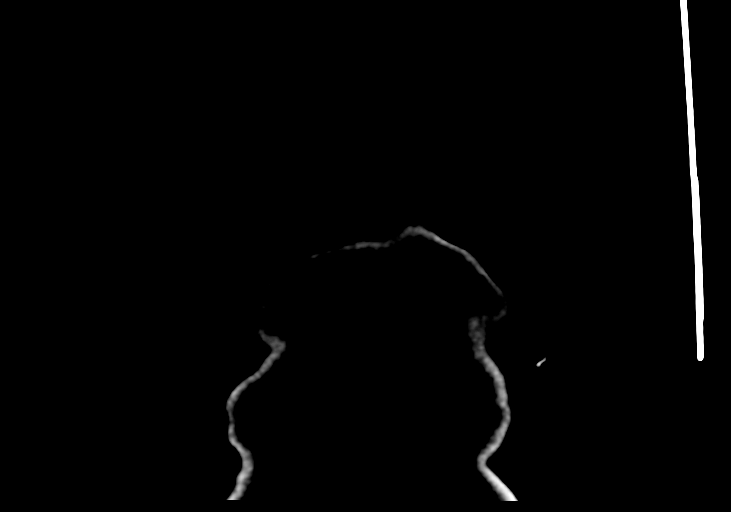
[im 7/67  brain]
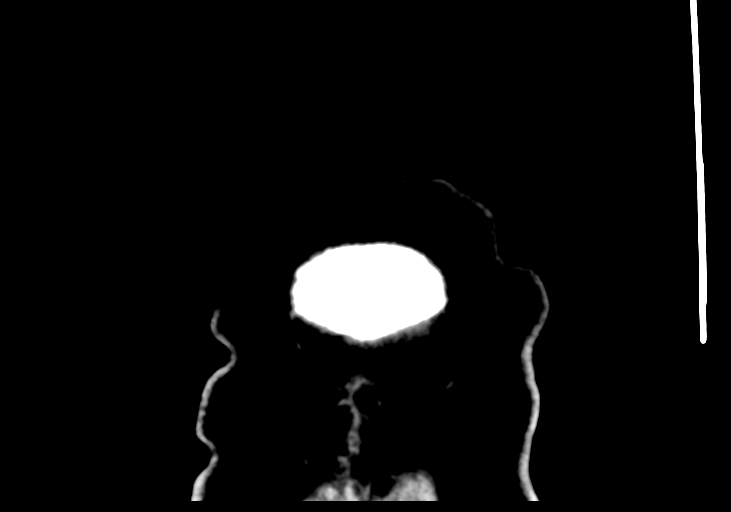
[im 11/67  brain]
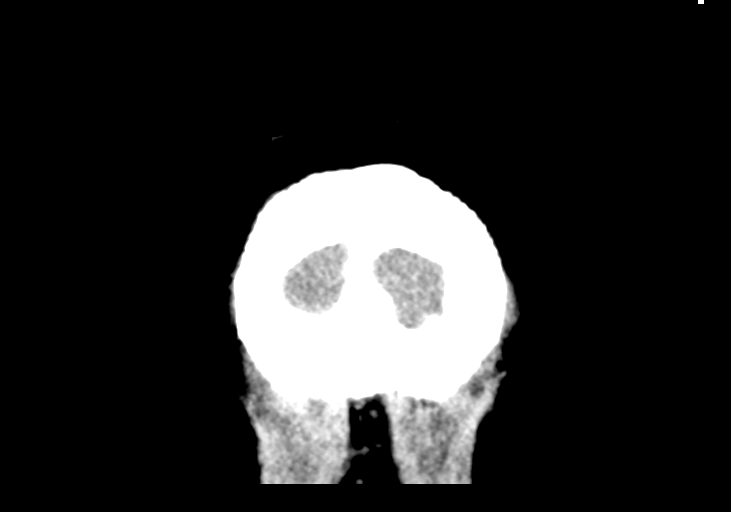

[Series 6: head without sag · sagittal · non-contrast · 0.30mm/px · 2 of 67 slices shown]
[im 23/67  brain]
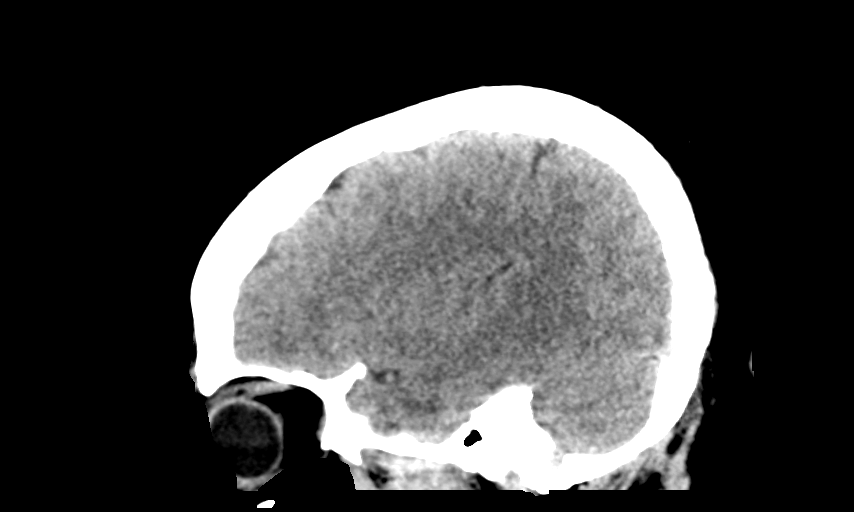
[im 45/67  brain]
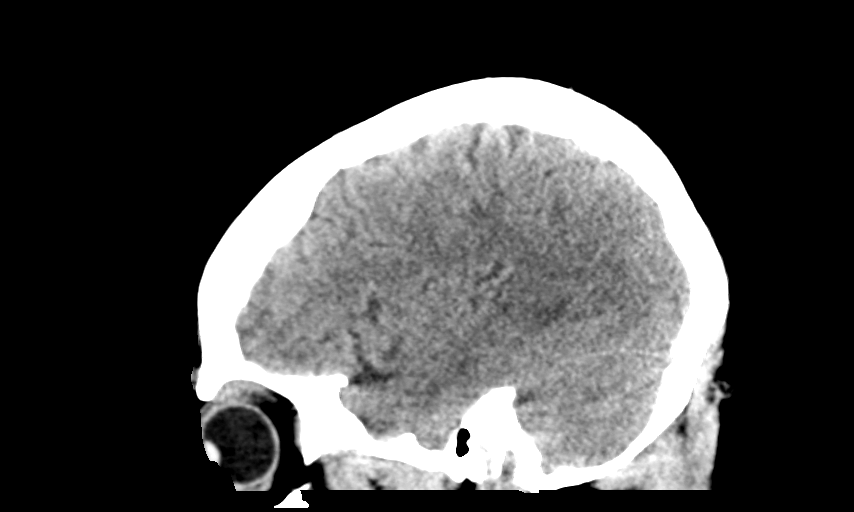

[Series 7: c_spine 2.0 st · axial · 0.32mm/px · z∈[-266,-226]mm · 3 of 90 slices shown]
[im 10/90  brain]
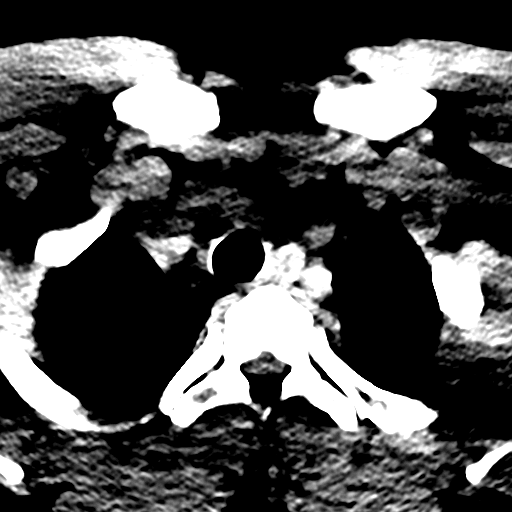
[im 20/90  brain]
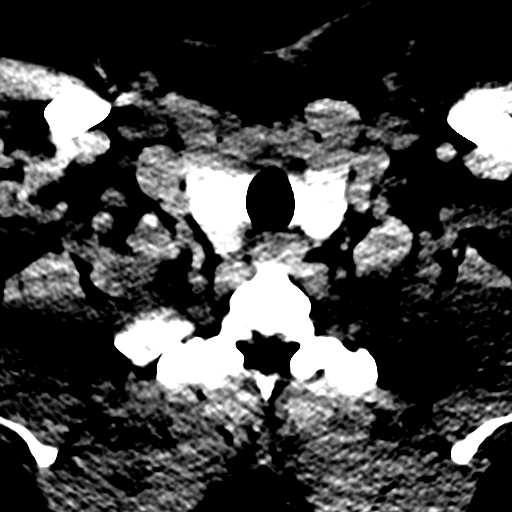
[im 30/90  brain]
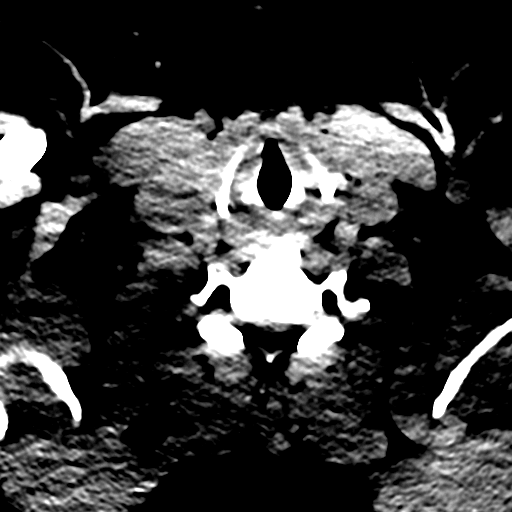

[16 of 47 positions shown; findings below may reference images not displayed]

FINDINGS: CT HEAD FINDINGS

Brain: No evidence of acute infarction, hemorrhage, hydrocephalus,
extra-axial collection or mass lesion/mass effect.

Vascular: No hyperdense vessel or unexpected calcification.

Skull: Normal. Negative for fracture or focal lesion.

Sinuses/Orbits: No acute finding.

Other: None.

CT CERVICAL SPINE FINDINGS

Alignment: Positional straightening of the normal cervical lordosis.

Skull base and vertebrae: No acute fracture. No primary bone lesion
or focal pathologic process.

Soft tissues and spinal canal: No prevertebral fluid or swelling. No
visible canal hematoma.

Disc levels:  Mild multilevel disc space height loss.

Upper chest: Negative.

Other: None.
IMPRESSION: 1.  No acute intracranial pathology.

2.  No fracture or static subluxation of the cervical spine.

## 2020-08-11 IMAGING — CR CHEST - 2 VIEW
2 series · 2 of 2 positions shown · non-contrast
Comparison: None.

CLINICAL DATA: Altered mental status.

EXAM:
CHEST - 2 VIEW

[chest lat]
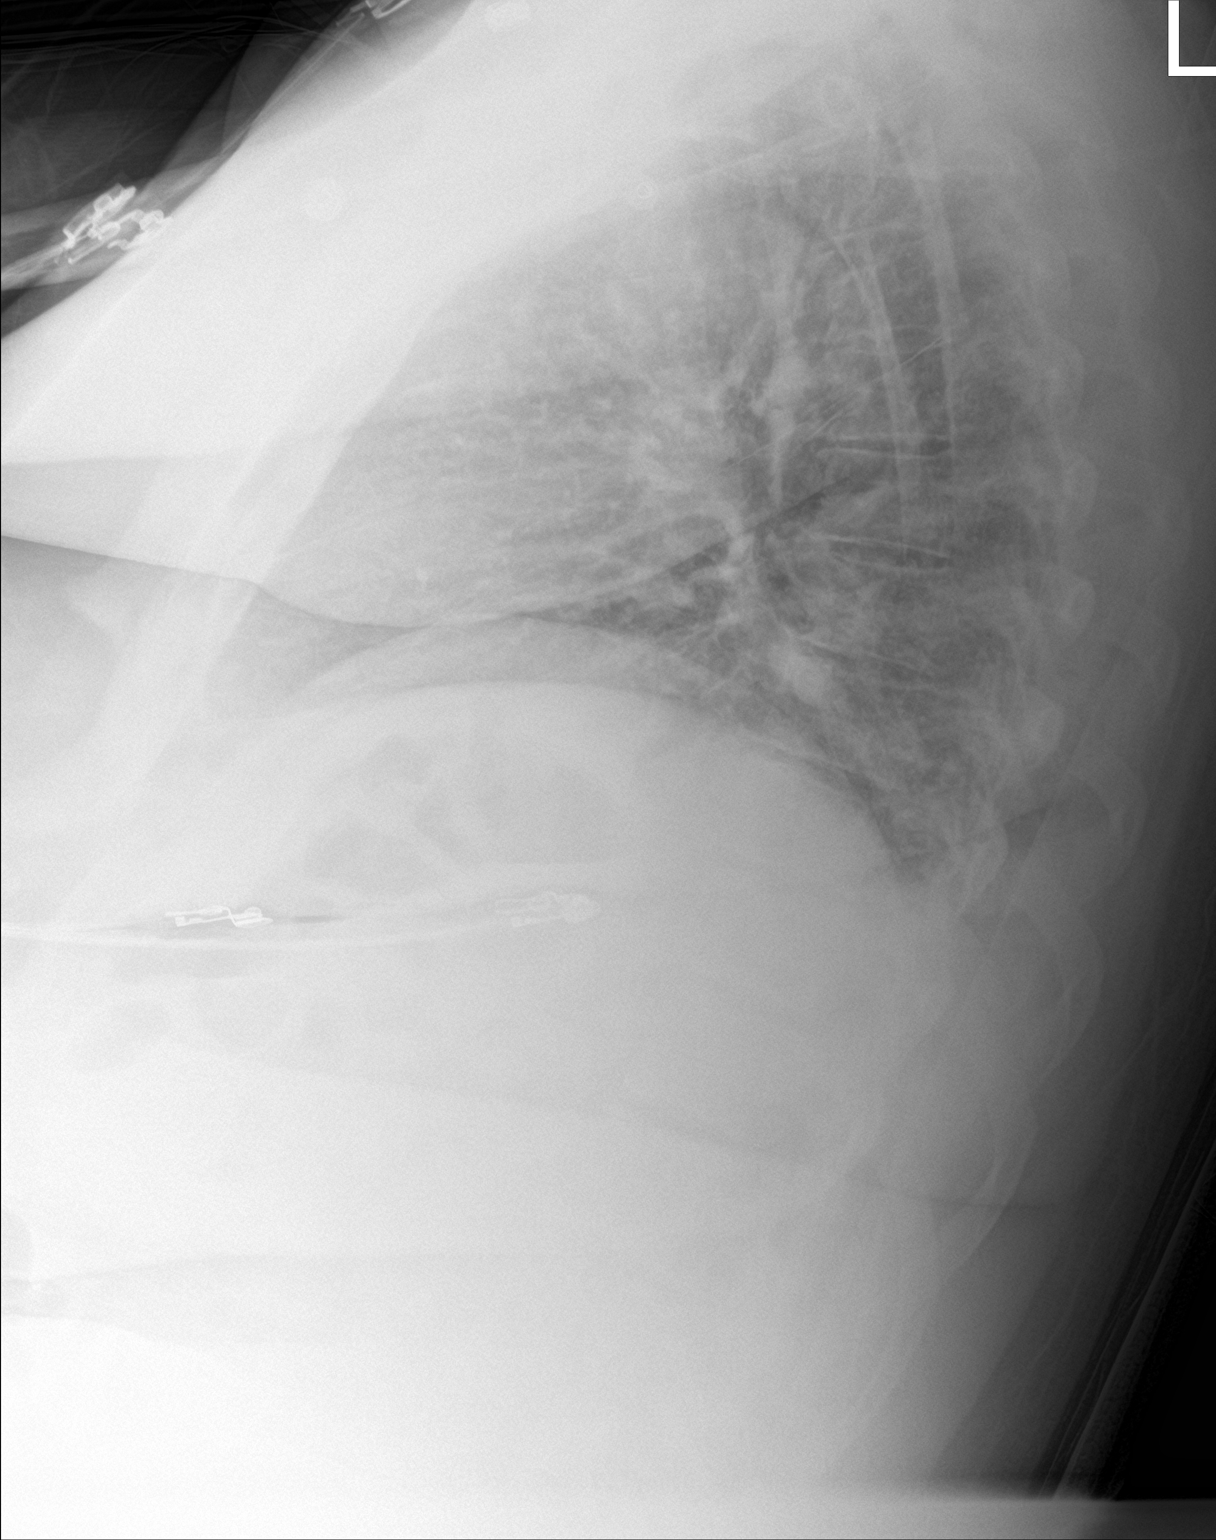

[chest ap]
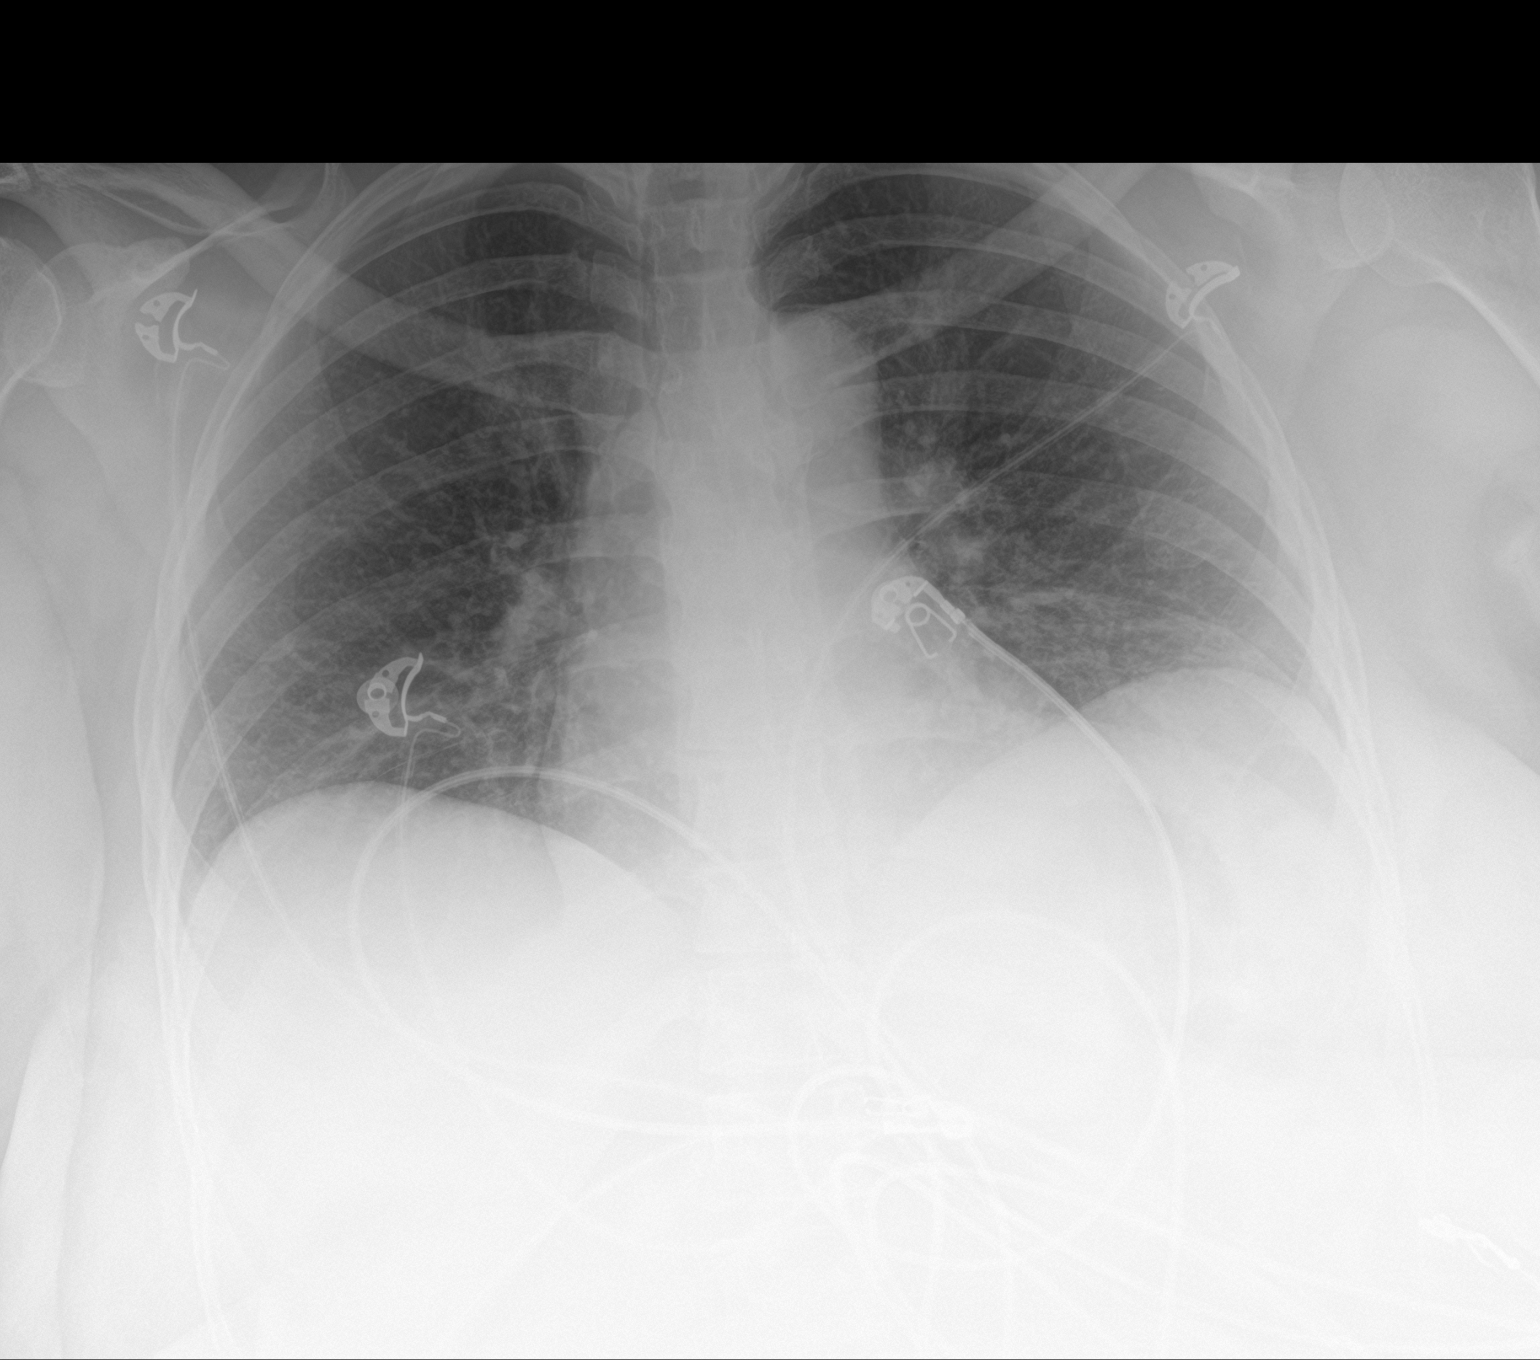

[2 of 2 positions shown; findings below may reference images not displayed]

FINDINGS: The heart size and mediastinal contours are within normal limits.
Normal pulmonary vascularity. Low lung volumes with mild bibasilar
atelectasis. No focal consolidation, pleural effusion, or
pneumothorax. No acute osseous abnormality.
IMPRESSION: 1. Low lung volumes with mild bibasilar atelectasis.

## 2020-10-07 ENCOUNTER — Ambulatory Visit: Payer: BLUE CROSS/BLUE SHIELD | Attending: Internal Medicine

## 2020-10-07 DIAGNOSIS — Z23 Encounter for immunization: Secondary | ICD-10-CM

## 2020-10-07 NOTE — Progress Notes (Signed)
   Covid-19 Vaccination Clinic  Name:  Nicole [name]    MRN: [id number] DOB: 07/03/1976  10/07/2020  [name] was observed post Covid-19 immunization for 15 minutes without incident. She was provided with Vaccine Information Sheet and instruction to access the V-Safe system.   [name] was instructed to call 911 with any severe reactions post vaccine: Marland Kitchen Difficulty breathing  . Swelling of face and throat  . A fast heartbeat  . A bad rash all over body  . Dizziness and weakness

## 2021-02-24 ENCOUNTER — Encounter (HOSPITAL_COMMUNITY): Payer: Self-pay | Admitting: Emergency Medicine

## 2021-02-24 ENCOUNTER — Emergency Department (HOSPITAL_COMMUNITY)
Admission: EM | Admit: 2021-02-24 | Discharge: 2021-02-24 | Disposition: A | Discharge status: Home / Self Care | Payer: BLUE CROSS/BLUE SHIELD | Attending: Emergency Medicine | Admitting: Emergency Medicine

## 2021-02-24 DIAGNOSIS — I959 Hypotension, unspecified: Secondary | ICD-10-CM | POA: Diagnosis present

## 2021-02-24 DIAGNOSIS — R5383 Other fatigue: Secondary | ICD-10-CM | POA: Insufficient documentation

## 2021-02-24 DIAGNOSIS — Z5321 Procedure and treatment not carried out due to patient leaving prior to being seen by health care provider: Secondary | ICD-10-CM | POA: Insufficient documentation

## 2021-02-24 DIAGNOSIS — R569 Unspecified convulsions: Secondary | ICD-10-CM | POA: Insufficient documentation

## 2021-02-24 NOTE — ED Triage Notes (Signed)
Per pt, states since loosing her mother she has been having increased lethargy, increased seizure activity-saw her neurologist today and they sent her here for low BP

## 2021-02-24 NOTE — ED Notes (Signed)
NT went to call a pt back to room and pt began cussing the NT out and said she was leaving.

## 2021-03-03 ENCOUNTER — Ambulatory Visit (HOSPITAL_COMMUNITY)
Admission: EM | Admit: 2021-03-03 | Discharge: 2021-03-03 | Disposition: A | Discharge status: Home / Self Care | Payer: BLUE CROSS/BLUE SHIELD | Attending: Urgent Care | Admitting: Urgent Care

## 2021-03-03 ENCOUNTER — Other Ambulatory Visit: Payer: Self-pay

## 2021-03-03 ENCOUNTER — Encounter (HOSPITAL_COMMUNITY): Payer: Self-pay

## 2021-03-03 ENCOUNTER — Ambulatory Visit (INDEPENDENT_AMBULATORY_CARE_PROVIDER_SITE_OTHER): Payer: BLUE CROSS/BLUE SHIELD

## 2021-03-03 DIAGNOSIS — R42 Dizziness and giddiness: Secondary | ICD-10-CM | POA: Diagnosis not present

## 2021-03-03 DIAGNOSIS — S96911A Strain of unspecified muscle and tendon at ankle and foot level, right foot, initial encounter: Secondary | ICD-10-CM | POA: Diagnosis not present

## 2021-03-03 DIAGNOSIS — M79672 Pain in left foot: Secondary | ICD-10-CM

## 2021-03-03 DIAGNOSIS — J3089 Other allergic rhinitis: Secondary | ICD-10-CM | POA: Diagnosis not present

## 2021-03-03 MED ORDER — PREDNISONE 20 MG PO TABS: ORAL_TABLET | ORAL | 0 refills | Status: DC

## 2021-03-03 NOTE — Discharge Instructions (Signed)
[facility] ([place]) Podiatrist in [place], [place] COVID-19 info: [email or web address] Get online care: [email or web address] Address: [address], [place], [place] [address] Phone: [phone] Appointments: [email or web address]   [facility], [place], [place] Doctor in [place], [place] Address: [address], [place], [place] [address] Phone: [phone]  

## 2021-03-03 NOTE — ED Triage Notes (Signed)
Pt presents with intermittent dizziness since waking up this morning.  Pt also complains of left foot pain X 2 weeks; pt states she fell 2 weeks ago when she had a seizure.

## 2021-03-03 NOTE — ED Provider Notes (Signed)
[facility]   MRN: [id number] DOB: Mar 15, 1976  Subjective:   Nicole [name] is a 45 y.o. female presenting for 2-week history of persistent and worsening left foot pain, now having difficulty bearing weight.  She has a history of a foot fracture and started to use her postop shoe again but her symptoms have only gotten worse.  She is also started having recurrence of her vertigo that started this morning.  Has had a difficult time with her sinuses and has been having sinus congestion, bilateral ear discomfort and popping, runny and stuffy nose.  She is taking both her nasal spray and Zyrtec.  States that typically she will do better with steroids when she gets to this point.  Denies history of diabetes.  No current facility-administered medications for this encounter.  Current Outpatient Medications:  .  acetaminophen (TYLENOL) 500 MG tablet, Take 1 tablet (500 mg total) by mouth every 6 (six) hours as needed. (Patient taking differently: Take 500 mg by mouth every 6 (six) hours as needed for mild pain or headache. ), Disp: 30 tablet, Rfl: 0 .  ALAYCEN 1/35 tablet, TAKE 1 TABLET BY MOUTH EVERY DAY (Patient not taking: Reported on 01/26/2020), Disp: 28 tablet, Rfl: 2 .  EPINEPHrine (EPIPEN 2-PAK) 0.3 mg/0.3 mL IJ SOAJ injection, Inject 0.3 mg into the muscle as needed (for allergic reaction)., Disp: , Rfl:  .  famotidine (PEPCID) 20 MG tablet, Take 1 tablet (20 mg total) by mouth daily. (Patient not taking: Reported on 01/26/2020), Disp: 15 tablet, Rfl: 0 .  Fe Cbn-Fe Gluc-FA-B12-C-DSS (FERRALET 90) 90-1 MG TABS, Take 1 tablet by mouth daily before breakfast. (Patient not taking: Reported on 01/26/2020), Disp: 30 each, Rfl: 11 .  ferrous sulfate 325 (65 FE) MG tablet, Take 1 tablet (325 mg total) by mouth daily. (Patient not taking: Reported on 01/26/2020), Disp: 30 tablet, Rfl: 0 .  ipratropium (ATROVENT) 0.06 % nasal spray, Place 2 sprays into both nostrils 4 (four) times daily.  (Patient not taking: Reported on 01/26/2020), Disp: 15 mL, Rfl: 0 .  levETIRAcetam (KEPPRA) 1000 MG tablet, Take 1 tablet (1,000 mg total) by mouth 2 (two) times daily for 10 days. (Patient taking differently: Take 1,000 mg by mouth 3 (three) times daily. ), Disp: 20 tablet, Rfl: 0 .  meclizine (ANTIVERT) 25 MG tablet, Take 1 tablet (25 mg total) by mouth 3 (three) times daily as needed for dizziness., Disp: 15 tablet, Rfl: 0 .  medroxyPROGESTERone (DEPO-PROVERA) 150 MG/ML injection, INJECT 1 ML (150 MG TOTAL) INTO THE MUSCLE EVERY 3 (THREE) MONTHS., Disp: 1 mL, Rfl: 4 .  montelukast (SINGULAIR) 10 MG tablet, Take 1 tablet (10 mg total) by mouth at bedtime. (Patient not taking: Reported on 01/26/2020), Disp: 90 tablet, Rfl: 3 .  naproxen (NAPROSYN) 500 MG tablet, Take 1 tablet (500 mg total) by mouth 2 (two) times daily with a meal., Disp: 30 tablet, Rfl: 0 .  predniSONE (STERAPRED UNI-PAK 21 TAB) 10 MG (21) TBPK tablet, 6 tabs for 1 day, then 5 tabs for 1 das, then 4 tabs for 1 day, then 3 tabs for 1 day, 2 tabs for 1 day, then 1 tab for 1 day, Disp: 21 tablet, Rfl: 0 .  prenatal vitamin w/FE, FA (PRENATAL 1 + 1) 27-1 MG TABS tablet, Take 1 tablet by mouth daily before breakfast. (Patient not taking: Reported on 01/26/2020), Disp: 30 each, Rfl: 11 .  topiramate (TOPAMAX) 25 MG tablet, Take 3 tablets (75 mg total)  by mouth 2 (two) times daily., Disp: 180 tablet, Rfl: 0 .  traMADol (ULTRAM) 50 MG tablet, Take 1 tablet (50 mg total) by mouth every 6 (six) hours as needed. (Patient taking differently: Take 50 mg by mouth every 6 (six) hours as needed for moderate pain. ), Disp: 15 tablet, Rfl: 0   Allergies  Allergen Reactions  . Penicillins Anaphylaxis and Swelling    Has patient had a PCN reaction causing immediate rash, facial/tongue/throat swelling, SOB or lightheadedness with hypotension: Yes Has patient had a PCN reaction causing severe rash involving mucus membranes or skin necrosis: Yes Has patient  had a PCN reaction that required hospitalization Yes- ed visit Has patient had a PCN reaction occurring within the last 10 years: No-childhood allergy If all of the above answers are "NO", then may proceed with Cephalosporin use.   . Shrimp [Shellfish Allergy] Anaphylaxis  . Sulfa Antibiotics Anaphylaxis, Swelling and Rash  . Tea Anaphylaxis    All teas    Past Medical History:  Diagnosis Date  . Anemia   . Asthma   . Atypical chest pain    a. ?pericarditis 2015.  Marland Kitchen Blood transfusion 2011   r/t anemia  . Fibroid   . Migraine   . Seizures ([place])   . Sickle cell trait ([place])   . Stroke ([place])   . Tooth abscess      Past Surgical History:  Procedure Laterality Date  . TUBAL LIGATION Bilateral 2005    Family History  Problem Relation Age of Onset  . Hypertension Mother   . Diabetes Mother   . Cancer Mother        Colon, brain, 2 other  . Asthma Mother   . Clotting disorder Mother   . Heart disease Mother        17, stents  . Stroke Mother        2012  . Diabetes Father   . Asthma Father   . Clotting disorder Father   . Sickle cell anemia Father   . Heart disease Father        2000, stents  . Hypertension Father   . Stroke Father        2007  . Diabetes Maternal Grandmother   . Asthma Sister   . Asthma Brother   . Asthma Paternal Grandmother   . Anesthesia problems Neg Hx     Social History   Tobacco Use  . Smoking status: Former Smoker    Quit date: 03/27/2014    Years since quitting: 6.9  . Smokeless tobacco: Never Used  Vaping Use  . Vaping Use: Never used  Substance Use Topics  . Alcohol use: No  . Drug use: No    ROS   Objective:   Vitals: BP 115/73 (BP Location: Right Arm)   Pulse 83   Temp 98.3 F (36.8 C) (Oral)   Resp 18   SpO2 100%   Physical Exam Constitutional:      General: She is not in acute distress.    Appearance: Normal appearance. She is well-developed. She is not ill-appearing, toxic-appearing or diaphoretic.   HENT:     Head: Normocephalic and atraumatic.     Right Ear: Tympanic membrane and ear canal normal. No drainage or tenderness. No middle ear effusion. Tympanic membrane is not erythematous.     Left Ear: Tympanic membrane and ear canal normal. No drainage or tenderness.  No middle ear effusion. Tympanic membrane is not erythematous.  Nose: Nose normal. No congestion or rhinorrhea.     Mouth/Throat:     Mouth: Mucous membranes are moist. No oral lesions.     Pharynx: Oropharynx is clear. No pharyngeal swelling, oropharyngeal exudate, posterior oropharyngeal erythema or uvula swelling.     Tonsils: No tonsillar exudate or tonsillar abscesses.  Eyes:     Extraocular Movements: Extraocular movements intact.     Right eye: Normal extraocular motion.     Left eye: Normal extraocular motion.     Conjunctiva/sclera: Conjunctivae normal.     Pupils: Pupils are equal, round, and reactive to light.  Cardiovascular:     Rate and Rhythm: Normal rate and regular rhythm.     Pulses: Normal pulses.     Heart sounds: Normal heart sounds. No murmur heard. No friction rub. No gallop.   Pulmonary:     Effort: Pulmonary effort is normal. No respiratory distress.     Breath sounds: Normal breath sounds. No stridor. No wheezing, rhonchi or rales.  Musculoskeletal:     Cervical back: Normal range of motion and neck supple.  Lymphadenopathy:     Cervical: No cervical adenopathy.  Skin:    General: Skin is warm and dry.     Findings: No rash.  Neurological:     General: No focal deficit present.     Mental Status: She is alert and oriented to person, place, and time.  Psychiatric:        Mood and Affect: Mood normal.        Behavior: Behavior normal.        Thought Content: Thought content normal.     DG Foot Complete Left  Result Date: 03/03/2021 CLINICAL DATA:  Left foot pain, recent seizure and fall EXAM: LEFT FOOT - COMPLETE 3+ VIEW COMPARISON:  None. FINDINGS: Three view radiograph left  foot demonstrates a mild first ray bunion deformity. Otherwise normal alignment. No fracture or dislocation. Joint spaces are preserved. No ankle effusion. Soft tissues are unremarkable. IMPRESSION: No acute fracture or dislocation. Electronically Signed   By: Fidela Salisbury MD   On: 03/03/2021 12:11    Assessment and Plan :   PDMP not reviewed this encounter.  1. Vertigo   2. Dizziness   3. Allergic rhinitis due to other allergic trigger, unspecified seasonality   4. Left foot pain     Start an oral prednisone course to help with allergic rhinitis which I suspect is the primary source of recurrent dizziness and vertigo as she also has had persistent and worsening sinus congestion, sneezing despite taking her regular allergy medications. Recommended she stop using a post-op shoe as there is no fracture. Prednisone may help with this too. Follow up asap with podiatry. Counseled patient on potential for adverse effects with medications prescribed/recommended today, ER and return-to-clinic precautions discussed, patient verbalized understanding.    Jaynee Eagles, PA-C 03/03/21 1226

## 2021-03-31 ENCOUNTER — Other Ambulatory Visit: Payer: Self-pay

## 2021-03-31 DIAGNOSIS — N644 Mastodynia: Secondary | ICD-10-CM

## 2021-04-06 ENCOUNTER — Encounter (HOSPITAL_COMMUNITY): Payer: Self-pay

## 2021-04-06 ENCOUNTER — Other Ambulatory Visit: Payer: Self-pay

## 2021-04-06 ENCOUNTER — Emergency Department (HOSPITAL_COMMUNITY)
Admission: EM | Admit: 2021-04-06 | Discharge: 2021-04-07 | Disposition: A | Discharge status: Home / Self Care | Payer: BLUE CROSS/BLUE SHIELD | Attending: Emergency Medicine | Admitting: Emergency Medicine

## 2021-04-06 DIAGNOSIS — J45909 Unspecified asthma, uncomplicated: Secondary | ICD-10-CM | POA: Insufficient documentation

## 2021-04-06 DIAGNOSIS — N644 Mastodynia: Secondary | ICD-10-CM | POA: Diagnosis not present

## 2021-04-06 DIAGNOSIS — G43909 Migraine, unspecified, not intractable, without status migrainosus: Secondary | ICD-10-CM | POA: Insufficient documentation

## 2021-04-06 DIAGNOSIS — Z87891 Personal history of nicotine dependence: Secondary | ICD-10-CM | POA: Insufficient documentation

## 2021-04-06 MED ORDER — METOCLOPRAMIDE HCL 5 MG/ML IJ SOLN
10.0000 mg | Freq: Once | INTRAMUSCULAR | Status: AC
  Administered 2021-04-06: 10 mg via INTRAVENOUS
  Filled 2021-04-06: qty 2

## 2021-04-06 MED ORDER — DEXAMETHASONE SODIUM PHOSPHATE 10 MG/ML IJ SOLN
10.0000 mg | Freq: Once | INTRAMUSCULAR | Status: AC
  Administered 2021-04-06: 10 mg via INTRAVENOUS
  Filled 2021-04-06: qty 1

## 2021-04-06 MED ORDER — ALUM & MAG HYDROXIDE-SIMETH 200-200-20 MG/5ML PO SUSP
30.0000 mL | Freq: Once | ORAL | Status: AC
  Administered 2021-04-06: 30 mL via ORAL
  Filled 2021-04-06: qty 30

## 2021-04-06 MED ORDER — LIDOCAINE VISCOUS HCL 2 % MT SOLN
15.0000 mL | Freq: Once | OROMUCOSAL | Status: AC
  Administered 2021-04-06: 15 mL via ORAL
  Filled 2021-04-06: qty 15

## 2021-04-06 MED ORDER — DIPHENHYDRAMINE HCL 50 MG/ML IJ SOLN
12.5000 mg | Freq: Once | INTRAMUSCULAR | Status: AC
  Administered 2021-04-06: 12.5 mg via INTRAVENOUS
  Filled 2021-04-06: qty 1

## 2021-04-06 NOTE — ED Provider Notes (Signed)
[facility] DEPT Provider Note   CSN: [id number] Arrival date & time: 04/06/21  2127     History Chief Complaint  Patient presents with  . Chest Pain  . Headache    Nicole [name] is a 45 y.o. female.  Patient to ED for evaluation of right sided chest burning and associated bilateral breast pain that started 3 days ago, and has been constant. No alleviating or aggravating factors. No nausea, SOB, cough, fever. She reports taking medication for acid reflux daily and feels this different. She also reports onset of migraine headache that started today. She has not taken anything for headache though she usually uses Imitrex. She reports her seizures usually occur after onset of headache. She reports being compliant with her Keppra.    The history is provided by the patient. No language interpreter was used.  Chest Pain Associated symptoms: headache   Associated symptoms: no abdominal pain, no fever and no nausea   Headache Associated symptoms: no abdominal pain, no fever and no nausea        Past Medical History:  Diagnosis Date  . Anemia   . Asthma   . Atypical chest pain    a. ?pericarditis 2015.  Marland Kitchen Blood transfusion 2011   r/t anemia  . Fibroid   . Migraine   . Seizures ([place])   . Sickle cell trait ([place])   . Stroke ([place])   . Tooth abscess     Patient Active Problem List   Diagnosis Date Noted  . 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 Depression   . Generalized abdominal pain   . Fatigue 10/28/2015  . Adjustment disorder with depressed mood 10/21/2015  . Dizziness   . Weakness 11/18/2014  . OSA (obstructive sleep apnea) 10/21/2014  . Acute rhinosinusitis 10/09/2014  . Leg pain, lateral 09/04/2014  . Migraines 08/13/2014  . Seizure ([place]) 06/04/2014  . Leiomyoma of uterus, unspecified 02/11/2014  . GERD (gastroesophageal reflux disease) 01/28/2014  . Candidiasis of vulva and vagina 01/28/2014  . Unspecified symptom associated with female genital  organs 01/28/2014  . Chest pain 01/20/2014  . Acute pericarditis, unspecified 01/20/2014    Past Surgical History:  Procedure Laterality Date  . TUBAL LIGATION Bilateral 2005     OB History    Gravida  3   Para  2   Term  1   Preterm  1   AB  1   Living  2     SAB  0   IAB  0   Ectopic  1   Multiple  0   Live Births  2           Family History  Problem Relation Age of Onset  . Hypertension Mother   . Diabetes Mother   . Cancer Mother        Colon, brain, 2 other  . Asthma Mother   . Clotting disorder Mother   . Heart disease Mother        23, stents  . Stroke Mother        2012  . Diabetes Father   . Asthma Father   . Clotting disorder Father   . Sickle cell anemia Father   . Heart disease Father        2000, stents  . Hypertension Father   . Stroke Father        2007  . Diabetes Maternal Grandmother   . Asthma Sister   . Asthma Brother   .  Asthma Paternal Grandmother   . Anesthesia problems Neg Hx     Social History   Tobacco Use  . Smoking status: Former Smoker    Quit date: 03/27/2014    Years since quitting: 7.0  . Smokeless tobacco: Never Used  Vaping Use  . Vaping Use: Never used  Substance Use Topics  . Alcohol use: No  . Drug use: No    Home Medications Prior to Admission medications   Medication Sig Start Date End Date Taking? Authorizing Provider  acetaminophen (TYLENOL) 500 MG tablet Take 1 tablet (500 mg total) by mouth every 6 (six) hours as needed. Patient taking differently: Take 500 mg by mouth every 6 (six) hours as needed for mild pain or headache.  01/16/20   Orvan July, NP  ALAYCEN 1/35 tablet TAKE 1 TABLET BY MOUTH EVERY DAY Patient not taking: Reported on 01/26/2020 08/07/19   Shelly Bombard, MD  EPINEPHrine (EPIPEN 2-PAK) 0.3 mg/0.3 mL IJ SOAJ injection Inject 0.3 mg into the muscle as needed (for allergic reaction).    [provider]  famotidine (PEPCID) 20 MG tablet Take 1 tablet (20 mg  total) by mouth daily. Patient not taking: Reported on 01/26/2020 11/13/19   Ok Edwards, PA-C  Fe Cbn-Fe Gluc-FA-B12-C-DSS (FERRALET 90) 90-1 MG TABS Take 1 tablet by mouth daily before breakfast. Patient not taking: Reported on 01/26/2020 01/08/19   Shelly Bombard, MD  ferrous sulfate 325 (65 FE) MG tablet Take 1 tablet (325 mg total) by mouth daily. Patient not taking: Reported on 01/26/2020 12/29/18   Petrucelli, Samantha R, PA-C  ipratropium (ATROVENT) 0.06 % nasal spray Place 2 sprays into both nostrils 4 (four) times daily. Patient not taking: Reported on 01/26/2020 11/13/19   Ok Edwards, PA-C  levETIRAcetam (KEPPRA) 1000 MG tablet Take 1 tablet (1,000 mg total) by mouth 2 (two) times daily for 10 days. Patient taking differently: Take 1,000 mg by mouth 3 (three) times daily.  12/15/19 01/26/20  Ok Edwards, PA-C  meclizine (ANTIVERT) 25 MG tablet Take 1 tablet (25 mg total) by mouth 3 (three) times daily as needed for dizziness. 12/15/19   Tasia Catchings, Amy V, PA-C  medroxyPROGESTERone (DEPO-PROVERA) 150 MG/ML injection INJECT 1 ML (150 MG TOTAL) INTO THE MUSCLE EVERY 3 (THREE) MONTHS. 03/03/20   Shelly Bombard, MD  montelukast (SINGULAIR) 10 MG tablet Take 1 tablet (10 mg total) by mouth at bedtime. Patient not taking: Reported on 01/26/2020 07/03/19   Ronnald Nian, DO  naproxen (NAPROSYN) 500 MG tablet Take 1 tablet (500 mg total) by mouth 2 (two) times daily with a meal. 08/08/20   Jaynee Eagles, PA-C  predniSONE (DELTASONE) 20 MG tablet Take 2 tablets daily with breakfast. 03/03/21   Jaynee Eagles, PA-C  prenatal vitamin w/FE, FA (PRENATAL 1 + 1) 27-1 MG TABS tablet Take 1 tablet by mouth daily before breakfast. Patient not taking: Reported on 01/26/2020 01/08/19   Shelly Bombard, MD  topiramate (TOPAMAX) 25 MG tablet Take 3 tablets (75 mg total) by mouth 2 (two) times daily. 07/07/18   Molpus, John, MD  traMADol (ULTRAM) 50 MG tablet Take 1 tablet (50 mg total) by mouth every 6 (six) hours as needed. Patient  taking differently: Take 50 mg by mouth every 6 (six) hours as needed for moderate pain.  01/16/20   Orvan July, NP    Allergies    Green tea leaf ext, Penicillins, Shellfish allergy, Shrimp extract allergy skin test, Sulfa antibiotics,  Tea, and Sulfamethoxazole  Review of Systems   Review of Systems  Constitutional: Negative for chills and fever.  HENT: Negative.   Respiratory: Negative.   Cardiovascular: Positive for chest pain (Breast pain).  Gastrointestinal: Negative.  Negative for abdominal pain and nausea.  Musculoskeletal: Negative.   Skin: Negative.   Neurological: Positive for headaches.    Physical Exam Updated Vital Signs BP (!) 148/102 (BP Location: Right Arm)   Pulse 95   Temp 98.7 F (37.1 C) (Oral)   Resp 18   Ht 5\' 5"  (1.651 m)   Wt 122.9 kg   SpO2 99%   BMI 45.10 kg/m   Physical Exam Vitals and nursing note reviewed.  Constitutional:      General: She is not in acute distress.    Appearance: She is well-developed.  HENT:     Head: Normocephalic.  Cardiovascular:     Rate and Rhythm: Normal rate and regular rhythm.  Pulmonary:     Effort: Pulmonary effort is normal.     Breath sounds: Normal breath sounds. No wheezing, rhonchi or rales.  Chest:     Comments: Bilateral nipple tenderness with discharge or bleeding. No breast mass or redness.  Abdominal:     General: Bowel sounds are normal.     Palpations: Abdomen is soft.     Tenderness: There is no abdominal tenderness. There is no guarding or rebound.  Musculoskeletal:        General: Normal range of motion.     Cervical back: Normal range of motion and neck supple.  Skin:    General: Skin is warm and dry.     Findings: No rash.  Neurological:     General: No focal deficit present.     Mental Status: She is alert and oriented to person, place, and time.     Comments: CN's 3-12 grossly intact. Speech is clear and focused. No facial asymmetry. No lateralizing weakness. No deficits of  coordination. Ambulatory without imbalance.       ED Results / Procedures / Treatments   Labs (all labs ordered are listed, but only abnormal results are displayed) Labs Reviewed - No data to display  EKG None  Radiology No results found.  Procedures Procedures   Medications Ordered in ED Medications - No data to display  ED Course  I have reviewed the triage vital signs and the nursing notes.  Pertinent labs & imaging results that were available during my care of the patient were reviewed by me and considered in my medical decision making (see chart for details).    MDM Rules/Calculators/A&P                          Patient to ED with right sided burning chest/breast pain x 3 days. Also has HA c/w her migraine pain that started today.   Breast exam is unremarkable. Refer to GYN. She reports mammogram scheduled for 5/3.  Headache cocktail ordered for symptoms typical of her migraine headache.   No relief of headache on recheck. She is requesting Imitrex, which is ordered.   On recheck, she is ambulatory to the bathroom and reports headache is much better. She reports being comfortable with discharge home.  Final Clinical Impression(s) / ED Diagnoses Final diagnoses:  None   1. Migraine headache 2. Breast tenderness  Rx / DC Orders ED Discharge Orders    None       Charlann Lange, PA-C 04/07/21 4132  Drenda Freeze, MD 04/08/21 [phone]

## 2021-04-06 NOTE — ED Triage Notes (Signed)
Pt reports intermittent sternal chest burning sensation that has been on going for 3 days. Also c/o headache that began around 2045 tonight.

## 2021-04-07 MED ORDER — SUMATRIPTAN SUCCINATE 6 MG/0.5ML ~~LOC~~ SOLN
6.0000 mg | Freq: Once | SUBCUTANEOUS | Status: AC
  Administered 2021-04-07: 6 mg via SUBCUTANEOUS
  Filled 2021-04-07: qty 0.5

## 2021-04-07 NOTE — ED Notes (Signed)
Pt verbalized understanding of d/c and follow up care. Ambulatory with steady gait.  

## 2021-04-07 NOTE — Discharge Instructions (Addendum)
Continue your regular medications as prescribed. Keep your already scheduled appointment for mammogram on 5/3 and consult your gynecologist if breast tenderness persists.

## 2021-04-12 ENCOUNTER — Ambulatory Visit: Payer: BLUE CROSS/BLUE SHIELD

## 2021-04-12 IMAGING — DX DG LUMBAR SPINE COMPLETE 4+V
4 series · 4 of 4 positions shown · non-contrast
Comparison: June 10, 2016.

CLINICAL DATA: Acute lower back pain after lifting injury.

EXAM:
LUMBAR SPINE - COMPLETE 4+ VIEW

[l-spine ap]
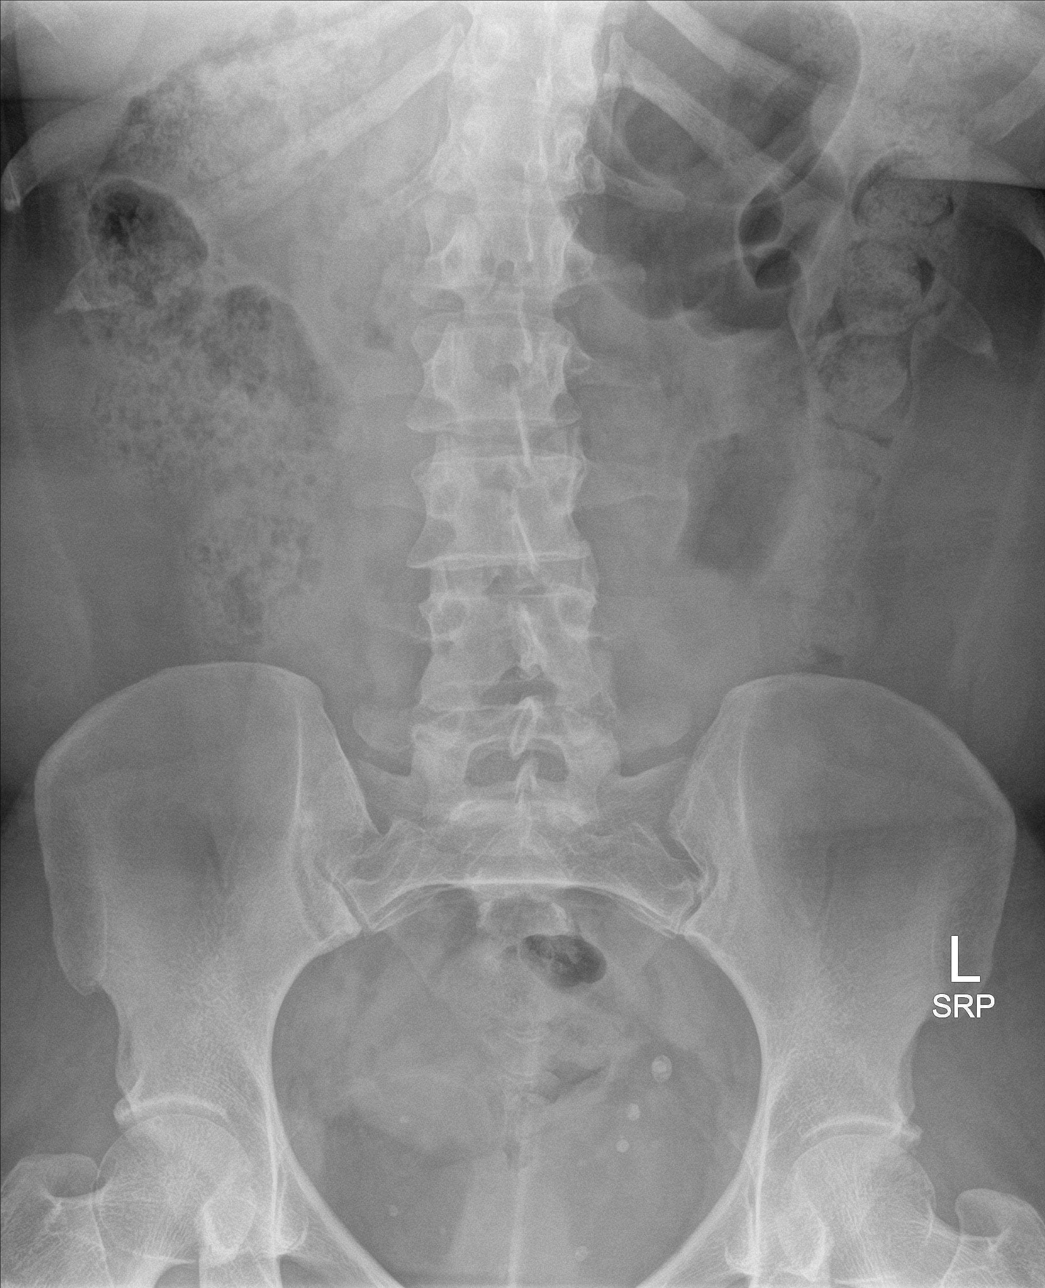

[l-spine obl (1 of 3)]
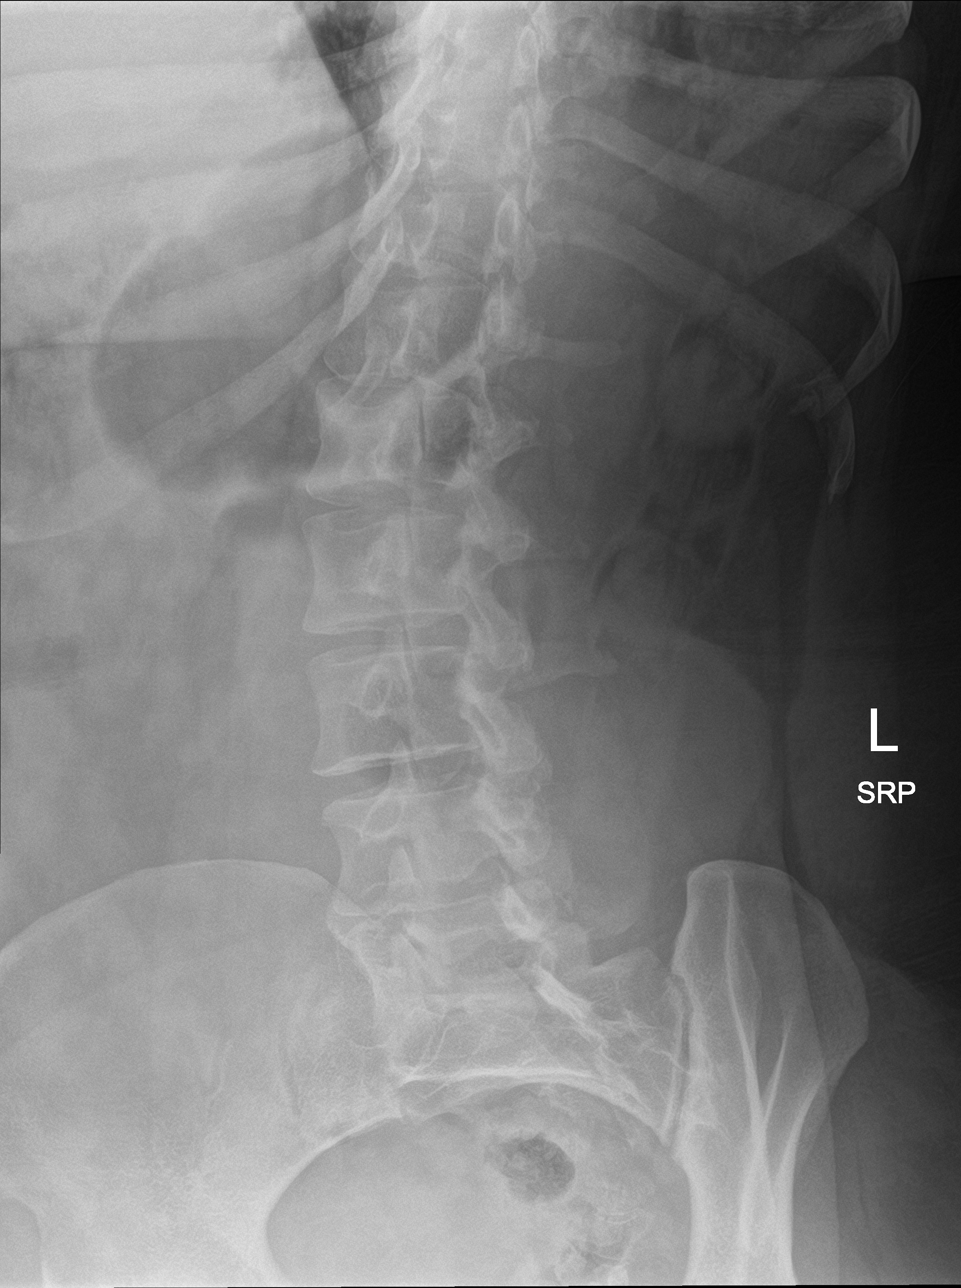

[l-spine obl (2 of 3)]
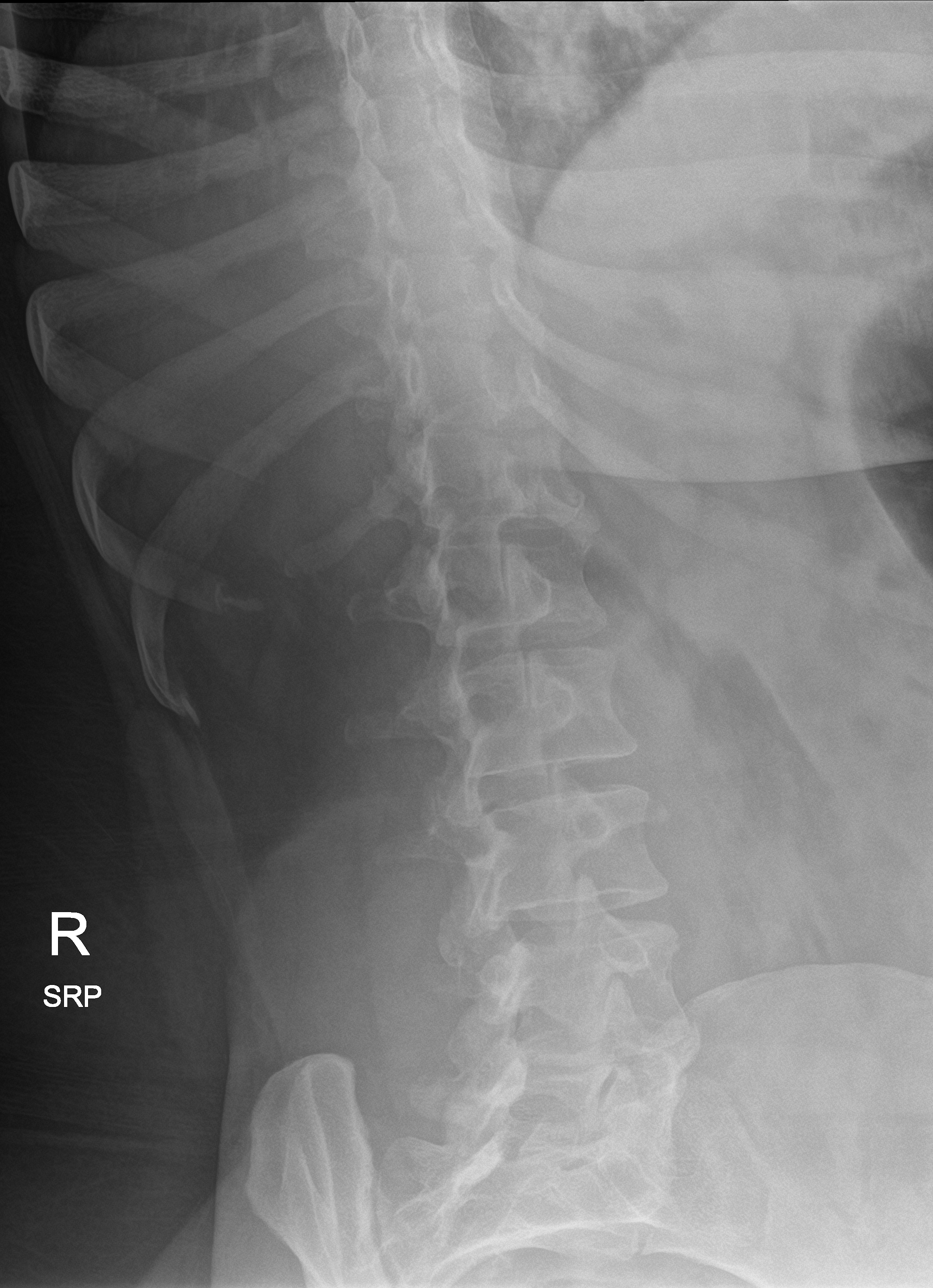

[l-spine obl (3 of 3)]
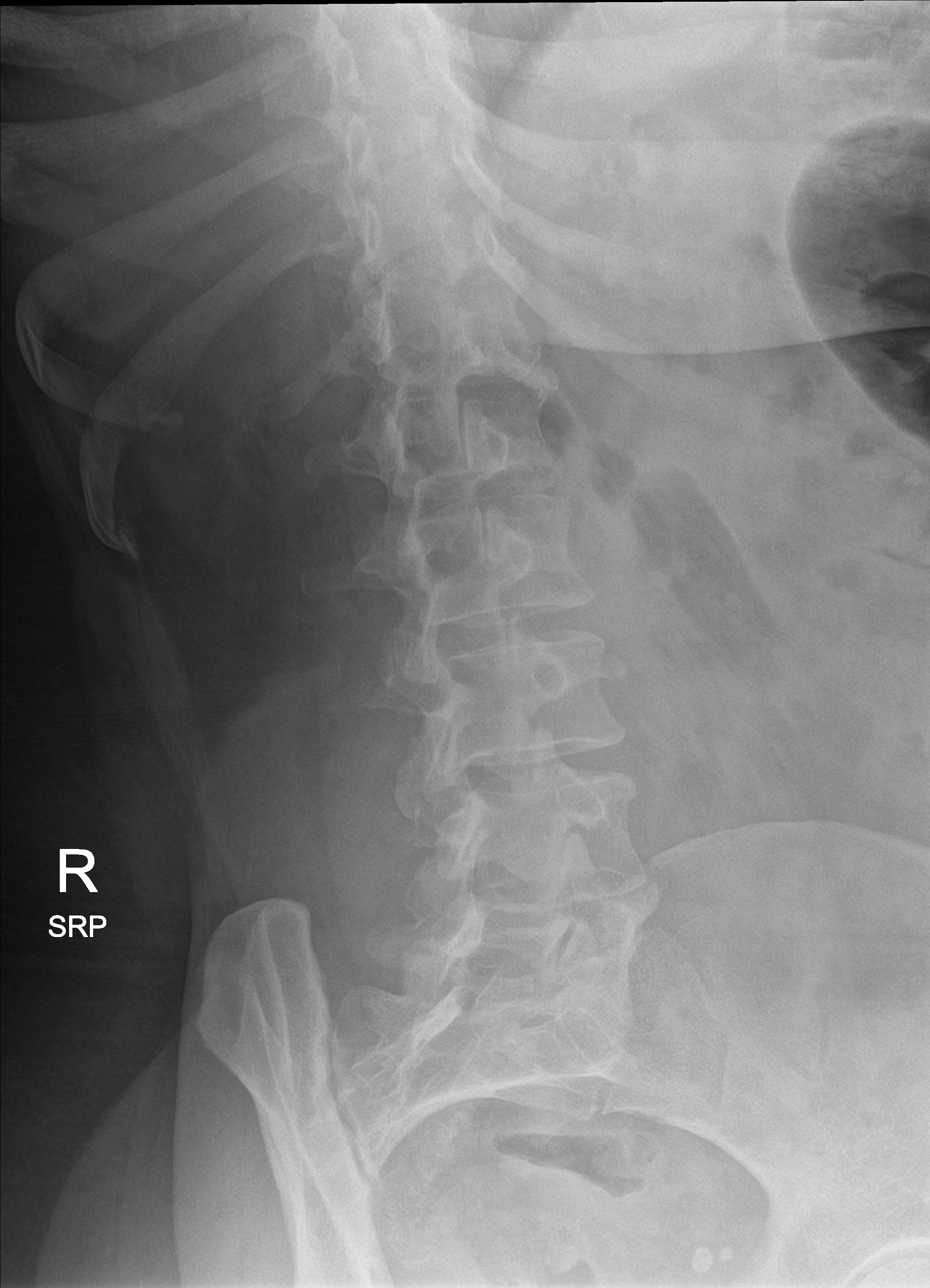

[4 of 4 positions shown; findings below may reference images not displayed]

FINDINGS: Exam is limited as patient declined lateral and spot projections.
There is no definite evidence of lumbar spine fracture. Degenerative
change may be present at L4-5, but without lateral projection this
cannot be ascertained adequately. Posterior facet joints are
unremarkable.
IMPRESSION: Exam is limited as described above. No definite fracture is noted.
Possible degenerative change may be present at L4-5, but without
lateral projection this cannot be ascertained adequately.

## 2021-04-22 IMAGING — MR MR LUMBAR SPINE W/O CM
5 of 6 series · 25 of 48 positions shown · non-contrast
Comparison: 07/20/2016

CLINICAL DATA: Posttraumatic severe low back pain since 01/13/2020

EXAM:
MRI LUMBAR SPINE WITHOUT CONTRAST
TECHNIQUE: Multiplanar, multisequence MR imaging of the lumbar spine was
performed. No intravenous contrast was administered.

[Series 11: T1 · sagittal · 4.0mm · 0.84mm/px · 3 of 16 slices shown (1 of 2)]
[im 1/16]
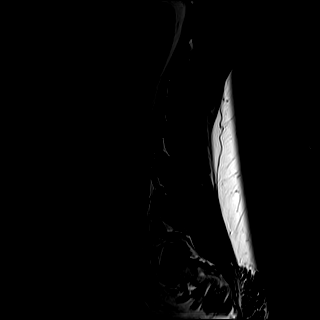
[im 8/16]
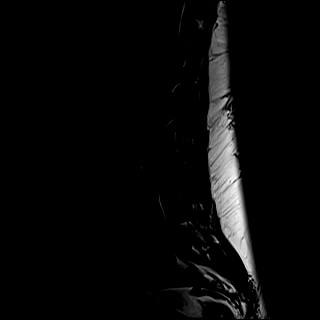
[im 16/16]
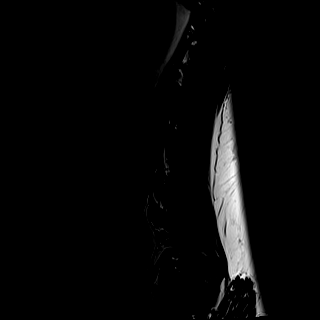

[Series 12: T2 · sagittal · 4.0mm · 0.81mm/px · 3 of 16 slices shown (1 of 2)]
[im 1/16]
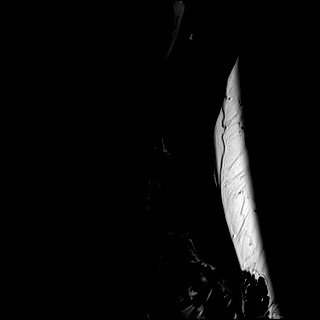
[im 8/16]
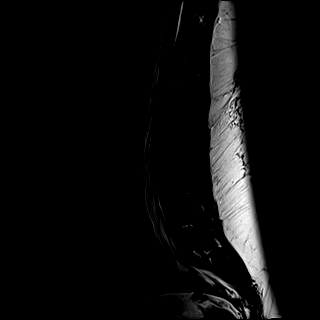
[im 16/16]
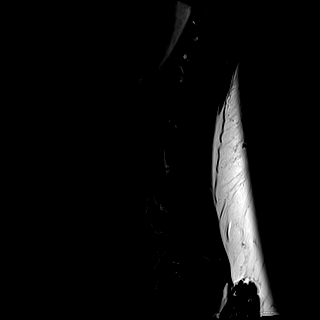

[Series 13: STIR · sagittal · 4.0mm · 0.51mm/px · 3 of 16 slices shown]
[im 1/16]
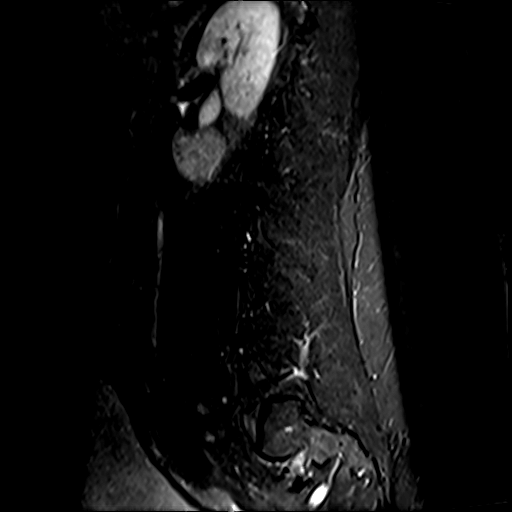
[im 8/16]
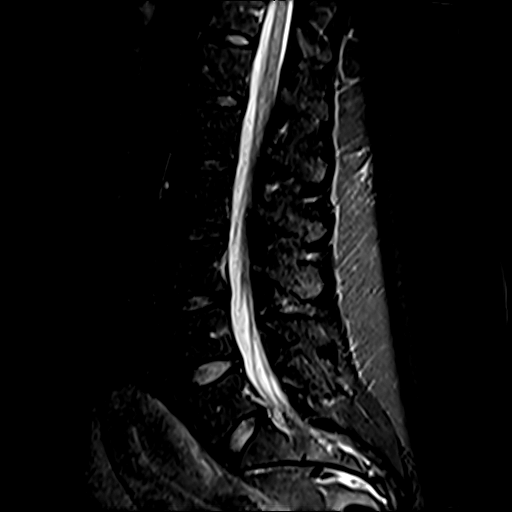
[im 16/16]
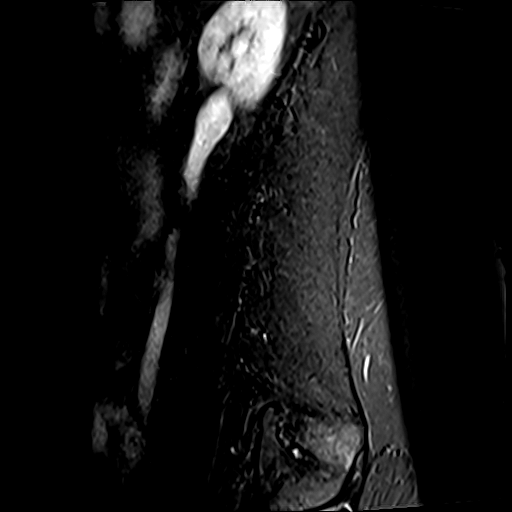

[Series 14: T2 · axial · 4.0mm · 0.62mm/px · z∈[-224,+1]mm · 8 of 41 slices shown (2 of 2)]
[im 1/41]
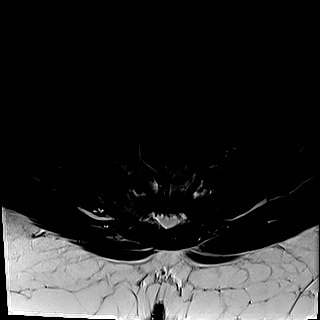
[im 6/41]
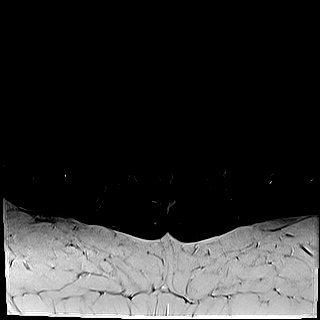
[im 12/41]
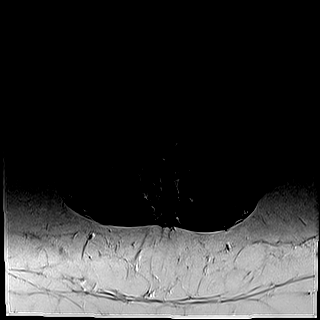
[im 18/41]
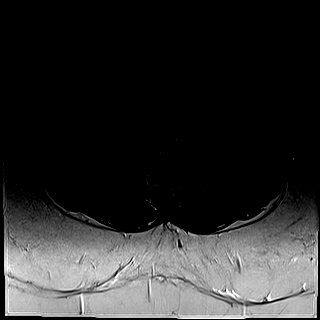
[im 23/41]
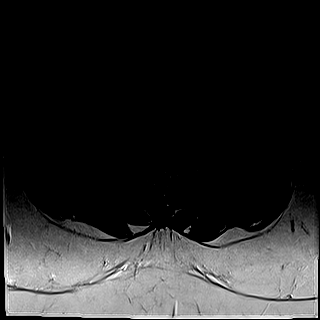
[im 29/41]
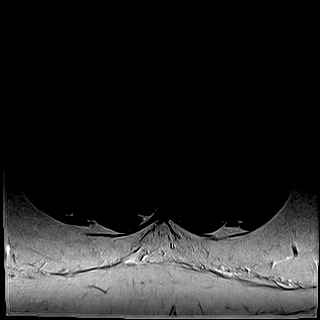
[im 35/41]
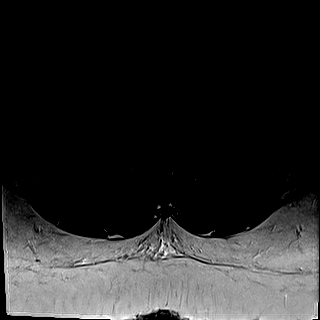
[im 41/41]
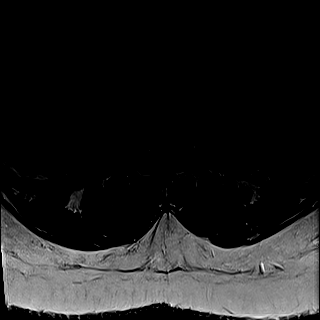

[Series 15: T1 · axial · 4.0mm · 0.43mm/px · z∈[-226,+2]mm · 8 of 41 slices shown (2 of 2)]
[im 1/41]
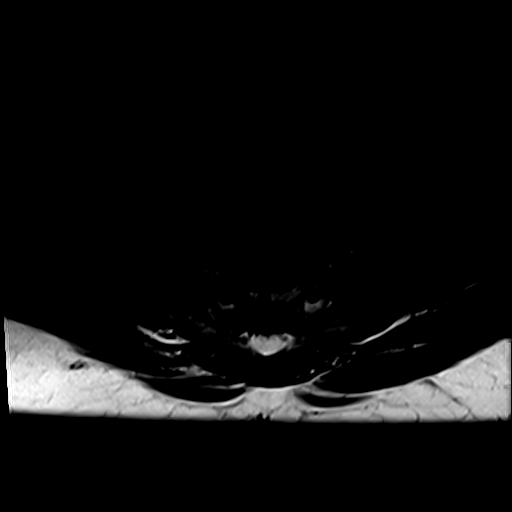
[im 6/41]
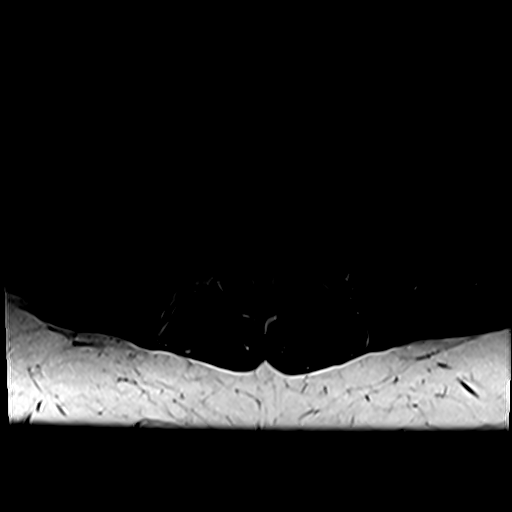
[im 12/41]
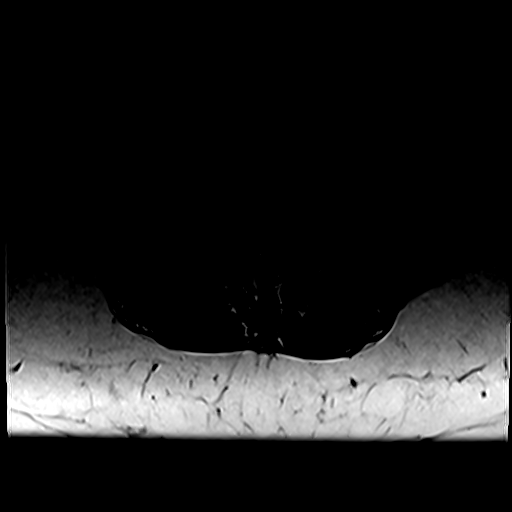
[im 18/41]
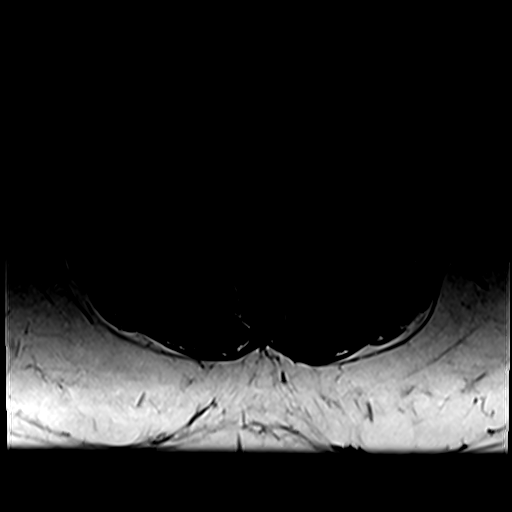
[im 23/41]
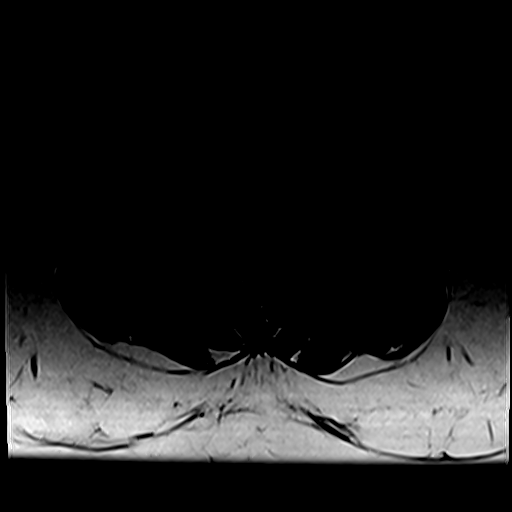
[im 29/41]
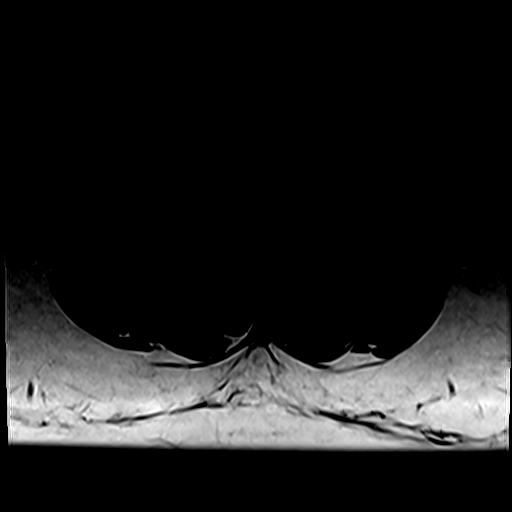
[im 35/41]
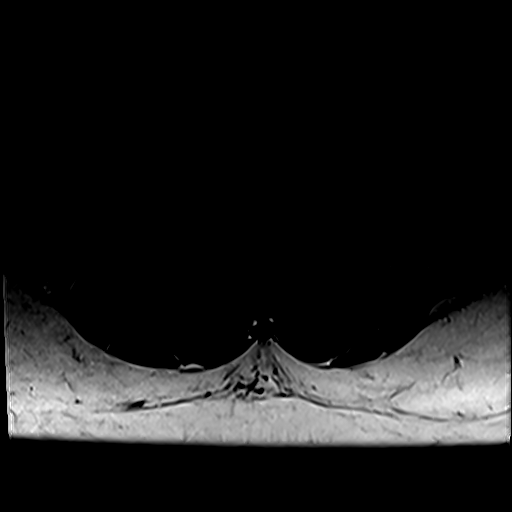
[im 41/41]
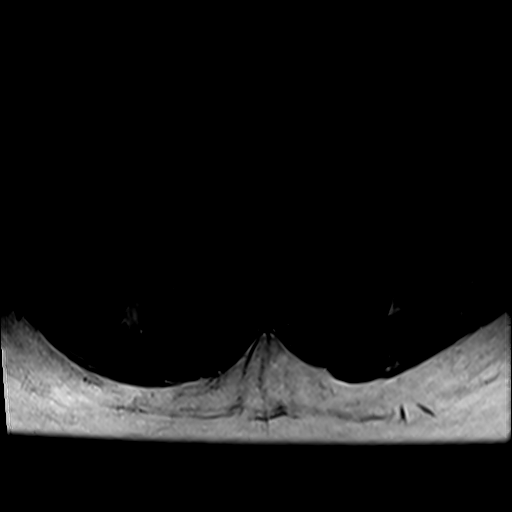

[25 of 48 positions shown; findings below may reference images not displayed]

FINDINGS: Segmentation:  Standard lumbar numbering

Alignment: Negative for listhesis. Mild dextrocurvature which could
be positional.

Vertebrae:  No fracture, evidence of discitis, or bone lesion.

Conus medullaris and cauda equina: Conus extends to the L1 level.
Conus and cauda equina appear normal.

Paraspinal and other soft tissues: Distended urinary bladder with
right hydroureteronephrosis. Small uterine fibroids.

Disc levels:

L2-3 small right paracentral to foraminal protrusion without neural
compression.

L3-4 small left foraminal protrusion without change from 6169. Early
facet spurring.

L5-S1 small central to right paracentral protrusion without neural
compression.
IMPRESSION: 1. No specific posttraumatic finding.
2. Noncompressive disc protrusions at L2-3, L3-4, and L4-5.
3. Over distended bladder which may explain right
hydroureteronephrosis. Suggest follow-up renal ultrasound after
bladder drainage.
4. Small uterine fibroids.

## 2021-04-26 ENCOUNTER — Ambulatory Visit
Admission: RE | Admit: 2021-04-26 | Discharge: 2021-04-26 | Disposition: A | Discharge status: Home / Self Care | Payer: No Typology Code available for payment source | Source: Ambulatory Visit | Attending: Obstetrics and Gynecology | Admitting: Obstetrics and Gynecology

## 2021-04-26 ENCOUNTER — Ambulatory Visit: Payer: BLUE CROSS/BLUE SHIELD

## 2021-04-26 ENCOUNTER — Other Ambulatory Visit: Payer: Self-pay

## 2021-04-26 ENCOUNTER — Ambulatory Visit: Payer: Self-pay | Admitting: *Deleted

## 2021-04-26 ENCOUNTER — Other Ambulatory Visit (HOSPITAL_COMMUNITY)
Admission: RE | Admit: 2021-04-26 | Discharge: 2021-04-26 | Disposition: A | Discharge status: Home / Self Care | Payer: No Typology Code available for payment source | Source: Ambulatory Visit | Attending: Obstetrics and Gynecology | Admitting: Obstetrics and Gynecology

## 2021-04-26 VITALS — BP 132/84 | Wt 273.2 lb

## 2021-04-26 DIAGNOSIS — Z1239 Encounter for other screening for malignant neoplasm of breast: Secondary | ICD-10-CM

## 2021-04-26 DIAGNOSIS — N6452 Nipple discharge: Secondary | ICD-10-CM | POA: Insufficient documentation

## 2021-04-26 NOTE — Progress Notes (Signed)
[name] is a 45 y.o. female who presents to [facility] clinic today with complaint of bilateral diffuse breast pain and bilateral clear nipple discharge. Patient stated the pain started 3-4 weeks ago. Patient stated the pain comes and goes. Patient rates the pain at a 8 out of 10 right breast and 10 out of 10 left breast. Patient stated the breast discharge is spontaneous and clear.    Pap Smear: Pap smear not completed today. Last Pap smear was 01/22/2019 at [facility] for [name] at [facility] clinic and was normal with negative HPV. Per patient has no history of an abnormal Pap smear. Last Pap smear result is available in Epic.   Physical exam: Breasts Left breast slightly larger than right breast that per patient has not noticed any changes. No skin abnormalities bilateral breasts. No nipple retraction bilateral breasts. Bilateral clear nipple discharge expressed on exam. Sample of discharge from the bilateral breasts sent to Cytology for evaluation. No lymphadenopathy. No lumps palpated bilateral breasts. Complaints of bilateral diffuse breast pain on exam that is greater within the nipple area both breasts.     Pelvic/Bimanual Pap is not indicated today per BCCCP guidelines.   Smoking History: Patient is a former smoker that quit 03/27/2014.   Patient Navigation: Patient education provided. Access to services provided for patient through BCCCP program.    Breast and Cervical Cancer Risk Assessment: Patient has family history of mother, maternal maternal grandmother, and paternal grandmother having breast cancer. Patient has no known genetic mutations or history of radiation treatment to the chest before age 21. Patient does not have history of cervical dysplasia, immunocompromised, or DES exposure in-utero.  Risk Assessment    Risk Scores      04/26/2021   Last edited by: Royston Bake, CMA   5-year risk: 1.2 %   Lifetime risk: 14.7 %          A: BCCCP exam  without pap smear Complaint of bilateral breast discharge and pain.  P: Referred patient to the [facility] for a diagnostic mammogram. Appointment scheduled Tuesday, Apr 26, 2021 at 1520.  [name], RN 04/26/2021 2:01 PM

## 2021-04-26 NOTE — Patient Instructions (Signed)
Explained breast self awareness with [name]. Patient did not need a Pap smear today due to last Pap smear and HPV typing was 01/22/2019. Let her know BCCCP will cover Pap smears and HPV typing every 5 years unless has a history of abnormal Pap smears. Referred patient to the [facility] for a diagnostic mammogram. Appointment scheduled Tuesday, Apr 26, 2021 at 1520. Patient aware of appointment and will be there. Let patient know will follow up with her within the next couple weeks with results of her breast discharge by phone. Nicole [name]  verbalized understanding.  Tonie Vizcarrondo, Arvil Chaco, RN 2:01 PM

## 2021-04-27 LAB — CYTOLOGY - NON PAP

## 2021-04-28 ENCOUNTER — Telehealth: Payer: Self-pay

## 2021-04-28 NOTE — Telephone Encounter (Signed)
Spoke with patient about breast discharge samples that were taken. Informed patient that both samples showed no malignancy. Patient voiced understanding.

## 2021-05-23 ENCOUNTER — Emergency Department (HOSPITAL_COMMUNITY)
Admission: EM | Admit: 2021-05-23 | Discharge: 2021-05-23 | Disposition: A | Discharge status: Home / Self Care | Payer: BLUE CROSS/BLUE SHIELD | Attending: Emergency Medicine | Admitting: Emergency Medicine

## 2021-05-23 ENCOUNTER — Emergency Department (HOSPITAL_COMMUNITY): Payer: BLUE CROSS/BLUE SHIELD

## 2021-05-23 ENCOUNTER — Encounter (HOSPITAL_COMMUNITY): Payer: Self-pay

## 2021-05-23 ENCOUNTER — Other Ambulatory Visit: Payer: Self-pay

## 2021-05-23 DIAGNOSIS — Z87891 Personal history of nicotine dependence: Secondary | ICD-10-CM | POA: Diagnosis not present

## 2021-05-23 DIAGNOSIS — J45909 Unspecified asthma, uncomplicated: Secondary | ICD-10-CM | POA: Insufficient documentation

## 2021-05-23 DIAGNOSIS — R0789 Other chest pain: Secondary | ICD-10-CM | POA: Diagnosis present

## 2021-05-23 LAB — BASIC METABOLIC PANEL
Anion gap: 8 (ref 5–15)
BUN: 10 mg/dL (ref 6–20)
CO2: 24 mmol/L (ref 22–32)
Calcium: 8.9 mg/dL (ref 8.9–10.3)
Chloride: 107 mmol/L (ref 98–111)
Creatinine, Ser: 0.73 mg/dL (ref 0.44–1.00)
GFR, Estimated: >60 mL/min (ref >60)
Glucose, Bld: 87 mg/dL (ref 70–99)
Potassium: 4.3 mmol/L (ref 3.5–5.1)
Sodium: 139 mmol/L (ref 135–145)

## 2021-05-23 LAB — TROPONIN I (HIGH SENSITIVITY): Troponin I (High Sensitivity): <2 ng/L (ref <18)

## 2021-05-23 LAB — CBC
HCT: 33.6 % — ABNORMAL LOW (ref 36.0–46.0)
Hemoglobin: 10.8 g/dL — ABNORMAL LOW (ref 12.0–15.0)
MCH: 27.8 pg (ref 26.0–34.0)
MCHC: 32.1 g/dL (ref 30.0–36.0)
MCV: 86.6 fL (ref 80.0–100.0)
Platelets: 302 10*3/uL (ref 150–400)
RBC: 3.88 MIL/uL (ref 3.87–5.11)
RDW: 14.4 % (ref 11.5–15.5)
WBC: 8.5 10*3/uL (ref 4.0–10.5)
nRBC: 0 % (ref 0.0–0.2)

## 2021-05-23 LAB — I-STAT BETA HCG BLOOD, ED (MC, WL, AP ONLY): I-stat hCG, quantitative: <5 m[IU]/mL (ref <5)

## 2021-05-23 MED ORDER — METHOCARBAMOL 500 MG PO TABS: 500.0000 mg | ORAL_TABLET | Freq: Two times a day (BID) | ORAL | 0 refills | Status: DC

## 2021-05-23 MED ORDER — DIAZEPAM 5 MG PO TABS
5.0000 mg | ORAL_TABLET | Freq: Once | ORAL | Status: AC
  Administered 2021-05-23: 5 mg via ORAL
  Filled 2021-05-23: qty 1

## 2021-05-23 MED ORDER — KETOROLAC TROMETHAMINE 60 MG/2ML IM SOLN
60.0000 mg | Freq: Once | INTRAMUSCULAR | Status: AC
  Administered 2021-05-23: 60 mg via INTRAMUSCULAR
  Filled 2021-05-23: qty 2

## 2021-05-23 NOTE — ED Provider Notes (Signed)
[facility] DEPT Provider Note   CSN: [id number] Arrival date & time: 05/23/21  1022     History No chief complaint on file.   Nicole [name] is a 45 y.o. female.  45 year old female presents with right-sided chest wall pain x24 hours.  Pain is worse with certain movements.  No associated dyspnea or diaphoresis.  Used aspirin without relief.  Denies any leg pain or swelling.  No rashes.  No prior diagnosis of this condition      Past Medical History:  Diagnosis Date   Anemia    Asthma    Atypical chest pain    a. ?pericarditis 2015.   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[facility])    Depression    Generalized abdominal pain    Fatigue 10/28/2015   Adjustment disorder with depressed mood 10/21/2015   Dizziness    Weakness 11/18/2014   OSA (obstructive sleep apnea) 10/21/2014   Acute rhinosinusitis 10/09/2014   Leg pain, lateral 09/04/2014   Migraines 08/13/2014   Seizure ([place]) 06/04/2014   Leiomyoma of uterus, unspecified 02/11/2014   GERD (gastroesophageal reflux disease) 01/28/2014   Candidiasis of vulva and vagina 01/28/2014   Unspecified symptom associated with female genital organs 01/28/2014   Chest pain 01/20/2014   Acute pericarditis, unspecified 01/20/2014    Past Surgical History:  Procedure Laterality Date   TUBAL LIGATION Bilateral 2005     OB History     Gravida  3   Para  2   Term  1   Preterm  1   AB  1   Living  2      SAB  0   IAB  0   Ectopic  1   Multiple  0   Live Births  2           Family History  Problem Relation Age of Onset   Hypertension Mother    Diabetes Mother    Cancer Mother        Colon, brain, 2 other   Asthma Mother    Clotting disorder Mother    Heart disease Mother        78, stents    Stroke Mother        2012   Breast cancer Mother 69   Diabetes Father    Asthma Father    Clotting disorder Father    Sickle cell anemia Father    Heart disease Father        2000, stents   Hypertension Father    Stroke Father        2007   Diabetes Maternal Grandmother    Breast cancer Maternal Grandmother    Asthma Sister    Asthma Brother    Asthma Paternal Grandmother    Breast cancer Maternal Grandfather    Anesthesia problems Neg Hx     Social History   Tobacco Use   Smoking status: Former    Pack years: 0.00    Types: Cigarettes    Quit date: 03/27/2014    Years since quitting: 7.1   Smokeless tobacco: Never  Vaping Use   Vaping Use: Never used  Substance Use Topics   Alcohol use: No  Drug use: No    Home Medications Prior to Admission medications   Medication Sig Start Date End Date Taking? Authorizing Provider  acetaminophen (TYLENOL) 500 MG tablet Take 1 tablet (500 mg total) by mouth every 6 (six) hours as needed. Patient not taking: Reported on 04/26/2021 01/16/20   Orvan July, NP  ALAYCEN 1/35 tablet TAKE 1 TABLET BY MOUTH EVERY DAY Patient not taking: No sig reported 08/07/19   Shelly Bombard, MD  busPIRone (BUSPAR) 5 MG tablet Take 1 tablet by mouth 2 (two) times daily. 02/17/21   [provider]  cetirizine (ZYRTEC) 10 MG tablet Take by mouth.    [provider]  cyclobenzaprine (FLEXERIL) 10 MG tablet TAKE 1 TABLET TWICE A DAY AS NEEDED FOR UP TO 5 DAYS FOR MUSCLE SPASM 01/26/20   [provider]  EPINEPHrine 0.3 mg/0.3 mL IJ SOAJ injection Inject 0.3 mg into the muscle as needed (for allergic reaction). Patient not taking: Reported on 04/26/2021    [provider]  EPINEPHrine 0.3 mg/0.3 mL IJ SOAJ injection Inject into the muscle.    [provider]  ergocalciferol (VITAMIN D2) 1.25 MG (50000 UT) capsule Take by mouth. 04/14/21 06/03/21  [provider]  famotidine (PEPCID) 20 MG tablet Take 1  tablet (20 mg total) by mouth daily. Patient not taking: No sig reported 11/13/19   Tasia Catchings, Amy V, PA-C  Fe Cbn-Fe Gluc-FA-B12-C-DSS (FERRALET 90) 90-1 MG TABS Take 1 tablet by mouth daily before breakfast. Patient not taking: No sig reported 01/08/19   Shelly Bombard, MD  ferrous sulfate 325 (65 FE) MG tablet Take 1 tablet (325 mg total) by mouth daily. Patient not taking: No sig reported 12/29/18   Petrucelli, Samantha R, PA-C  fluticasone (FLONASE) 50 MCG/ACT nasal spray Place into the nose. 12/21/20   [provider]  ipratropium (ATROVENT) 0.06 % nasal spray Place 2 sprays into both nostrils 4 (four) times daily. Patient not taking: No sig reported 11/13/19   Ok Edwards, PA-C  levETIRAcetam (KEPPRA) 1000 MG tablet Take 1 tablet (1,000 mg total) by mouth 2 (two) times daily for 10 days. Patient taking differently: Take 1,000 mg by mouth 3 (three) times daily.  12/15/19 01/26/20  Ok Edwards, PA-C  levETIRAcetam (KEPPRA) 1000 MG tablet 1 tablet by mouth in the morning and 2 tablets by mouth at bedtime 04/08/21   [provider]  levofloxacin (LEVAQUIN) 500 MG tablet Take 500 mg by mouth daily. 12/21/20   [provider]  meclizine (ANTIVERT) 25 MG tablet Take 1 tablet (25 mg total) by mouth 3 (three) times daily as needed for dizziness. 12/15/19   Tasia Catchings, Amy V, PA-C  medroxyPROGESTERone (DEPO-PROVERA) 150 MG/ML injection INJECT 1 ML (150 MG TOTAL) INTO THE MUSCLE EVERY 3 (THREE) MONTHS. 03/03/20   Shelly Bombard, MD  montelukast (SINGULAIR) 10 MG tablet Take 1 tablet (10 mg total) by mouth at bedtime. Patient not taking: No sig reported 07/03/19   Letta Median K, DO  naproxen (NAPROSYN) 500 MG tablet Take 1 tablet (500 mg total) by mouth 2 (two) times daily with a meal. Patient not taking: Reported on 04/26/2021 08/08/20   Jaynee Eagles, PA-C  predniSONE (DELTASONE) 20 MG tablet Take 2 tablets daily with breakfast. Patient not taking: Reported on 04/26/2021 03/03/21   Jaynee Eagles, PA-C   prenatal vitamin w/FE, FA (PRENATAL 1 + 1) 27-1 MG TABS tablet Take 1 tablet by mouth daily before breakfast. Patient not taking: No sig  reported 01/08/19   Shelly Bombard, MD  promethazine (PHENERGAN) 25 MG tablet Take by mouth. 04/08/21   [provider]  SUMAtriptan (IMITREX) 100 MG tablet Take by mouth. 04/08/21   [provider]  topiramate (TOPAMAX) 25 MG tablet Take 3 tablets (75 mg total) by mouth 2 (two) times daily. Patient not taking: Reported on 04/26/2021 07/07/18   Molpus, Jenny Reichmann, MD  traMADol (ULTRAM) 50 MG tablet Take 1 tablet (50 mg total) by mouth every 6 (six) hours as needed. Patient not taking: Reported on 04/26/2021 01/16/20   Orvan July, NP    Allergies    Green tea leaf ext, Penicillins, Shellfish allergy, Shrimp extract allergy skin test, Sulfa antibiotics, Tea, and Sulfamethoxazole  Review of Systems   Review of Systems  All other systems reviewed and are negative.  Physical Exam Updated Vital Signs BP (!) 138/93 (BP Location: Right Arm)   Pulse 86   Temp 98.2 F (36.8 C) (Oral)   Resp (!) 26   Ht 1.651 m (5\' 5" )   Wt 120.2 kg   LMP 05/20/2021   SpO2 99%   BMI 44.10 kg/m   Physical Exam Vitals and nursing note reviewed.  Constitutional:      General: She is not in acute distress.    Appearance: Normal appearance. She is well-developed. She is not toxic-appearing.  HENT:     Head: Normocephalic and atraumatic.  Eyes:     General: Lids are normal.     Conjunctiva/sclera: Conjunctivae normal.     Pupils: Pupils are equal, round, and reactive to light.  Neck:     Thyroid: No thyroid mass.     Trachea: No tracheal deviation.  Cardiovascular:     Rate and Rhythm: Normal rate and regular rhythm.     Heart sounds: Normal heart sounds. No murmur heard.   No gallop.  Pulmonary:     Effort: Pulmonary effort is normal. No respiratory distress.     Breath sounds: Normal breath sounds. No stridor. No decreased breath sounds, wheezing,  rhonchi or rales.  Chest:     Chest wall: Tenderness present.  Abdominal:     General: Bowel sounds are normal. There is no distension.     Palpations: Abdomen is soft.     Tenderness: There is no abdominal tenderness. There is no rebound.  Musculoskeletal:        General: No tenderness. Normal range of motion.     Cervical back: Normal range of motion and neck supple.  Skin:    General: Skin is warm and dry.     Findings: No abrasion or rash.  Neurological:     Mental Status: She is alert and oriented to person, place, and time. Mental status is at baseline.     GCS: GCS eye subscore is 4. GCS verbal subscore is 5. GCS motor subscore is 6.     Cranial Nerves: Cranial nerves are intact. No cranial nerve deficit.     Sensory: No sensory deficit.     Motor: Motor function is intact.  Psychiatric:        Attention and Perception: Attention normal.        Speech: Speech normal.        Behavior: Behavior normal.    ED Results / Procedures / Treatments   Labs (all labs ordered are listed, but only abnormal results are displayed) Labs Reviewed  CBC - Abnormal; Notable for the following components:      Result Value  Hemoglobin 10.8 (*)    HCT 33.6 (*)    All other components within normal limits  BASIC METABOLIC PANEL  I-STAT BETA HCG BLOOD, ED ([facility], [facility], [facility] ONLY)  TROPONIN I (HIGH SENSITIVITY)    EKG EKG Interpretation  Date/Time:  Monday May 23 2021 10:29:51 EDT Ventricular Rate:  85 PR Interval:  166 QRS Duration: 84 QT Interval:  362 QTC Calculation: 431 R Axis:   41 Text Interpretation: Sinus rhythm 12 Lead; Mason-Likar Confirmed by Lacretia Leigh ([id number]) on 05/23/2021 11:22:43 AM  Radiology DG Chest 2 View  Result Date: 05/23/2021 CLINICAL DATA:  Chest pain on the right EXAM: CHEST - 2 VIEW COMPARISON:  10/17/2019 FINDINGS: The heart size and mediastinal contours are within normal limits. Both lungs are clear. The visualized skeletal structures are unremarkable.  IMPRESSION: No active cardiopulmonary disease. Electronically Signed   By: Inez Catalina M.D.   On: 05/23/2021 11:16    Procedures Procedures   Medications Ordered in ED Medications  ketorolac (TORADOL) injection 60 mg (has no administration in time range)  diazepam (VALIUM) tablet 5 mg (has no administration in time range)    ED Course  I have reviewed the triage vital signs and the nursing notes.  Pertinent labs & imaging results that were available during my care of the patient were reviewed by me and considered in my medical decision making (see chart for details).    MDM Rules/Calculators/A&P                         Patient treated for chest wall pain does feel better.  Labs and x-rays reassuring.  Will discharge home Final Clinical Impression(s) / ED Diagnoses Final diagnoses:  None    Rx / DC Orders ED Discharge Orders     None        Lacretia Leigh, MD 05/23/21 1308

## 2021-05-23 NOTE — ED Triage Notes (Signed)
Patient c/o right chest pain that radiates into the right arm at 0915 today. Patient states she took 325 mg Aspirin 4 tabs at 0900 and Phenergan at 0936 today.

## 2021-06-21 DIAGNOSIS — Z79899 Other long term (current) drug therapy: Secondary | ICD-10-CM | POA: Diagnosis not present

## 2021-06-21 DIAGNOSIS — J45909 Unspecified asthma, uncomplicated: Secondary | ICD-10-CM | POA: Insufficient documentation

## 2021-06-21 DIAGNOSIS — R0602 Shortness of breath: Secondary | ICD-10-CM | POA: Diagnosis not present

## 2021-06-21 DIAGNOSIS — I409 Acute myocarditis, unspecified: Principal | ICD-10-CM | POA: Insufficient documentation

## 2021-06-21 DIAGNOSIS — Z87891 Personal history of nicotine dependence: Secondary | ICD-10-CM | POA: Insufficient documentation

## 2021-06-21 DIAGNOSIS — Z20822 Contact with and (suspected) exposure to covid-19: Secondary | ICD-10-CM | POA: Insufficient documentation

## 2021-06-21 DIAGNOSIS — R079 Chest pain, unspecified: Secondary | ICD-10-CM | POA: Diagnosis present

## 2021-06-22 ENCOUNTER — Emergency Department (HOSPITAL_COMMUNITY): Payer: BLUE CROSS/BLUE SHIELD

## 2021-06-22 ENCOUNTER — Observation Stay (HOSPITAL_BASED_OUTPATIENT_CLINIC_OR_DEPARTMENT_OTHER): Payer: BLUE CROSS/BLUE SHIELD

## 2021-06-22 ENCOUNTER — Other Ambulatory Visit: Payer: Self-pay

## 2021-06-22 ENCOUNTER — Encounter (HOSPITAL_COMMUNITY): Payer: Self-pay | Admitting: Emergency Medicine

## 2021-06-22 ENCOUNTER — Observation Stay (HOSPITAL_COMMUNITY)
Admission: EM | Admit: 2021-06-22 | Discharge: 2021-06-23 | Disposition: A | Discharge status: Home / Self Care | Payer: BLUE CROSS/BLUE SHIELD | Attending: Internal Medicine | Admitting: Internal Medicine

## 2021-06-22 DIAGNOSIS — I208 Other forms of angina pectoris: Secondary | ICD-10-CM

## 2021-06-22 DIAGNOSIS — K2100 Gastro-esophageal reflux disease with esophagitis, without bleeding: Secondary | ICD-10-CM | POA: Diagnosis not present

## 2021-06-22 DIAGNOSIS — R079 Chest pain, unspecified: Secondary | ICD-10-CM

## 2021-06-22 DIAGNOSIS — I401 Isolated myocarditis: Secondary | ICD-10-CM | POA: Diagnosis not present

## 2021-06-22 DIAGNOSIS — R569 Unspecified convulsions: Secondary | ICD-10-CM

## 2021-06-22 DIAGNOSIS — E669 Obesity, unspecified: Secondary | ICD-10-CM | POA: Diagnosis present

## 2021-06-22 DIAGNOSIS — K219 Gastro-esophageal reflux disease without esophagitis: Secondary | ICD-10-CM | POA: Diagnosis present

## 2021-06-22 DIAGNOSIS — R0602 Shortness of breath: Secondary | ICD-10-CM | POA: Diagnosis not present

## 2021-06-22 HISTORY — DX: Rheumatoid arthritis, unspecified: M06.9

## 2021-06-22 LAB — TROPONIN I (HIGH SENSITIVITY)
Troponin I (High Sensitivity): <2 ng/L (ref <18)
Troponin I (High Sensitivity): <2 ng/L (ref <18)

## 2021-06-22 LAB — CBC WITH DIFFERENTIAL/PLATELET
Abs Immature Granulocytes: 0.02 K/uL (ref 0.00–0.07)
Basophils Absolute: 0.1 K/uL (ref 0.0–0.1)
Basophils Relative: 1 %
Eosinophils Absolute: 0.5 K/uL (ref 0.0–0.5)
Eosinophils Relative: 7 %
HCT: 34.6 % — ABNORMAL LOW (ref 36.0–46.0)
Hemoglobin: 11 g/dL — ABNORMAL LOW (ref 12.0–15.0)
Immature Granulocytes: 0 %
Lymphocytes Relative: 41 %
Lymphs Abs: 3 K/uL (ref 0.7–4.0)
MCH: 27.9 pg (ref 26.0–34.0)
MCHC: 31.8 g/dL (ref 30.0–36.0)
MCV: 87.8 fL (ref 80.0–100.0)
Monocytes Absolute: 0.5 K/uL (ref 0.1–1.0)
Monocytes Relative: 7 %
Neutro Abs: 3.3 K/uL (ref 1.7–7.7)
Neutrophils Relative %: 44 %
Platelets: 290 K/uL (ref 150–400)
RBC: 3.94 MIL/uL (ref 3.87–5.11)
RDW: 14.1 % (ref 11.5–15.5)
WBC: 7.4 K/uL (ref 4.0–10.5)
nRBC: 0 % (ref 0.0–0.2)

## 2021-06-22 LAB — ECHOCARDIOGRAM COMPLETE
AR max vel: 2.43 cm2
AV Area VTI: 2.36 cm2
AV Area mean vel: 2.5 cm2
AV Mean grad: 4 mmHg
AV Peak grad: 8 mmHg
Ao pk vel: 1.41 m/s
Area-P 1/2: 3.85 cm2
S' Lateral: 2.8 cm

## 2021-06-22 LAB — BASIC METABOLIC PANEL WITH GFR
Anion gap: 7 (ref 5–15)
BUN: 9 mg/dL (ref 6–20)
CO2: 26 mmol/L (ref 22–32)
Calcium: 9 mg/dL (ref 8.9–10.3)
Chloride: 106 mmol/L (ref 98–111)
Creatinine, Ser: 0.91 mg/dL (ref 0.44–1.00)
GFR, Estimated: >60 mL/min
Glucose, Bld: 92 mg/dL (ref 70–99)
Potassium: 3.6 mmol/L (ref 3.5–5.1)
Sodium: 139 mmol/L (ref 135–145)

## 2021-06-22 LAB — C-REACTIVE PROTEIN: CRP: 0.6 mg/dL (ref <1.0)

## 2021-06-22 LAB — D-DIMER, QUANTITATIVE: D-Dimer, Quant: 0.39 ug/mL-FEU (ref 0.00–0.50)

## 2021-06-22 LAB — SEDIMENTATION RATE: Sed Rate: 10 mm/hr (ref 0–22)

## 2021-06-22 LAB — RESP PANEL BY RT-PCR (FLU A&B, COVID) ARPGX2
Influenza A by PCR: NEGATIVE
Influenza B by PCR: NEGATIVE
SARS Coronavirus 2 by RT PCR: NEGATIVE

## 2021-06-22 LAB — BRAIN NATRIURETIC PEPTIDE: B Natriuretic Peptide: 42.8 pg/mL (ref 0.0–100.0)

## 2021-06-22 MED ORDER — ONDANSETRON HCL 4 MG/2ML IJ SOLN: 4.0000 mg | Freq: Four times a day (QID) | INTRAMUSCULAR | Status: DC | PRN

## 2021-06-22 MED ORDER — INDOMETHACIN 25 MG PO CAPS
25.0000 mg | ORAL_CAPSULE | Freq: Once | ORAL | Status: AC
  Administered 2021-06-22: 25 mg via ORAL
  Filled 2021-06-22: qty 1

## 2021-06-22 MED ORDER — ACETAMINOPHEN 325 MG PO TABS: 650.0000 mg | ORAL_TABLET | Freq: Four times a day (QID) | ORAL | Status: DC | PRN

## 2021-06-22 MED ORDER — PANTOPRAZOLE SODIUM 40 MG PO TBEC: 40.0000 mg | DELAYED_RELEASE_TABLET | Freq: Every day | ORAL | Status: DC

## 2021-06-22 MED ORDER — MONTELUKAST SODIUM 10 MG PO TABS
10.0000 mg | ORAL_TABLET | Freq: Every day | ORAL | Status: DC
  Administered 2021-06-22: 10 mg via ORAL
  Filled 2021-06-22: qty 1

## 2021-06-22 MED ORDER — PANTOPRAZOLE SODIUM 40 MG PO TBEC
40.0000 mg | DELAYED_RELEASE_TABLET | Freq: Two times a day (BID) | ORAL | Status: DC
  Administered 2021-06-22 – 2021-06-23 (×3): 40 mg via ORAL
  Filled 2021-06-22 (×3): qty 1

## 2021-06-22 MED ORDER — SORBITOL 70 % SOLN
960.0000 mL | TOPICAL_OIL | Freq: Once | ORAL | Status: AC
  Administered 2021-06-22: 960 mL via RECTAL
  Filled 2021-06-22: qty 473

## 2021-06-22 MED ORDER — LEVETIRACETAM 500 MG PO TABS
1000.0000 mg | ORAL_TABLET | Freq: Every day | ORAL | Status: DC
  Administered 2021-06-22: 1000 mg via ORAL
  Filled 2021-06-22: qty 2

## 2021-06-22 MED ORDER — SENNOSIDES-DOCUSATE SODIUM 8.6-50 MG PO TABS
1.0000 | ORAL_TABLET | Freq: Two times a day (BID) | ORAL | Status: DC
  Administered 2021-06-22: 1 via ORAL
  Filled 2021-06-22: qty 1

## 2021-06-22 MED ORDER — FENTANYL CITRATE (PF) 100 MCG/2ML IJ SOLN
100.0000 ug | Freq: Once | INTRAMUSCULAR | Status: AC
  Administered 2021-06-22: 100 ug via INTRAVENOUS
  Filled 2021-06-22: qty 2

## 2021-06-22 MED ORDER — HYDROMORPHONE HCL 1 MG/ML IJ SOLN: 1.0000 mg | INTRAMUSCULAR | Status: DC | PRN

## 2021-06-22 MED ORDER — FLUTICASONE PROPIONATE 50 MCG/ACT NA SUSP: 1.0000 | Freq: Every day | NASAL | Status: DC

## 2021-06-22 MED ORDER — OXYCODONE HCL 5 MG PO TABS
5.0000 mg | ORAL_TABLET | ORAL | Status: DC | PRN
  Administered 2021-06-22 – 2021-06-23 (×2): 5 mg via ORAL
  Filled 2021-06-22 (×2): qty 1

## 2021-06-22 MED ORDER — POLYETHYLENE GLYCOL 3350 17 G PO PACK
17.0000 g | PACK | Freq: Every day | ORAL | Status: DC
  Administered 2021-06-22: 17 g via ORAL
  Filled 2021-06-22: qty 1

## 2021-06-22 MED ORDER — LEVETIRACETAM 500 MG PO TABS
2000.0000 mg | ORAL_TABLET | Freq: Every day | ORAL | Status: DC
  Administered 2021-06-22: 2000 mg via ORAL
  Filled 2021-06-22: qty 4

## 2021-06-22 MED ORDER — BUSPIRONE HCL 10 MG PO TABS
5.0000 mg | ORAL_TABLET | Freq: Two times a day (BID) | ORAL | Status: DC
  Administered 2021-06-22 (×2): 5 mg via ORAL
  Filled 2021-06-22 (×2): qty 1

## 2021-06-22 MED ORDER — HYDROCODONE-ACETAMINOPHEN 5-325 MG PO TABS
2.0000 | ORAL_TABLET | Freq: Once | ORAL | Status: AC
  Administered 2021-06-22: 2 via ORAL
  Filled 2021-06-22: qty 2

## 2021-06-22 MED ORDER — METHOCARBAMOL 500 MG PO TABS
500.0000 mg | ORAL_TABLET | Freq: Two times a day (BID) | ORAL | Status: DC
  Administered 2021-06-22 (×2): 500 mg via ORAL
  Filled 2021-06-22 (×2): qty 1

## 2021-06-22 MED ORDER — LEVETIRACETAM 500 MG PO TABS: 1000.0000 mg | ORAL_TABLET | Freq: Every day | ORAL | Status: DC

## 2021-06-22 MED ORDER — HEPARIN SODIUM (PORCINE) 5000 UNIT/ML IJ SOLN
5000.0000 [IU] | Freq: Three times a day (TID) | INTRAMUSCULAR | Status: DC
  Administered 2021-06-22 – 2021-06-23 (×3): 5000 [IU] via SUBCUTANEOUS
  Filled 2021-06-22 (×3): qty 1

## 2021-06-22 NOTE — ED Triage Notes (Addendum)
Patient arrives complaining of left sided chest and arm pain with associated N/V and light headedness that began earlier today. Patient took 325 of aspirin. Pain is worse when she lays down.

## 2021-06-22 NOTE — ED Provider Notes (Signed)
[facility] DEPT Provider Note   CSN: [id number] Arrival date & time: 06/21/21  2346     History Chief Complaint  Patient presents with   Chest Pain    Nicole [name] is a 45 y.o. female.  The history is provided by the patient.  Chest Pain Pain location:  L chest Pain quality: dull and sharp   Pain radiates to:  L arm Pain severity:  Moderate Onset quality:  Gradual Duration:  2 days Timing:  Constant Progression:  Unchanged Chronicity:  New Relieved by:  Aspirin Worsened by:  Certain positions Associated symptoms: nausea, shortness of breath and vomiting   Associated symptoms: no abdominal pain, no fever and no lower extremity edema   Patient with previous history of pericarditis, asthma presents with chest pain.  Patient reports over the past 2 days she has had chest pain in the left side of the wrist or left arm.  Reports it is sharp and dull at times.  Also reports associated shortness of breath.  She has had 2 episodes of vomiting.  It does worsen with lying flat.  She reports previous history of pericarditis and this felt similar.  No pleuritic pain at this time Denies previous history of CAD/VTE HPI: A 45 year old patient with a history of obesity presents for evaluation of chest pain. Initial onset of pain was more than 6 hours ago. The patient's chest pain is not worse with exertion. The patient complains of nausea. The patient's chest pain is middle- or left-sided, is not well-localized, is not described as heaviness/pressure/tightness, is not sharp and does radiate to the arms/jaw/neck. The patient denies diaphoresis. The patient has no history of stroke, has no history of peripheral artery disease, has not smoked in the past 90 days, denies any history of treated diabetes, has no relevant family history of coronary artery disease (first degree relative at less than age 92), is not hypertensive and has no history of hypercholesterolemia.    Past Medical History:  Diagnosis Date   Anemia    Asthma    Atypical chest pain    a. ?pericarditis 2015.   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[facility])    Depression    Generalized abdominal pain    Fatigue 10/28/2015   Adjustment disorder with depressed mood 10/21/2015   Dizziness    Weakness 11/18/2014   OSA (obstructive sleep apnea) 10/21/2014   Acute rhinosinusitis 10/09/2014   Leg pain, lateral 09/04/2014   Migraines 08/13/2014   Seizure ([place]) 06/04/2014   Leiomyoma of uterus, unspecified 02/11/2014   GERD (gastroesophageal reflux disease) 01/28/2014   Candidiasis of vulva and vagina 01/28/2014   Unspecified symptom associated with female genital organs 01/28/2014   Chest pain 01/20/2014   Acute pericarditis, unspecified 01/20/2014    Past Surgical History:  Procedure Laterality Date   TUBAL LIGATION Bilateral 2005     OB History     Gravida  3   Para  2   Term  1   Preterm  1   AB  1   Living  2      SAB  0   IAB  0   Ectopic  1   Multiple  0  Live Births  2           Family History  Problem Relation Age of Onset   Hypertension Mother    Diabetes Mother    Cancer Mother        Colon, brain, 2 other   Asthma Mother    Clotting disorder Mother    Heart disease Mother        73, stents   Stroke Mother        2012   Breast cancer Mother 49   Diabetes Father    Asthma Father    Clotting disorder Father    Sickle cell anemia Father    Heart disease Father        2000, stents   Hypertension Father    Stroke Father        2007   Diabetes Maternal Grandmother    Breast cancer Maternal Grandmother    Asthma Sister    Asthma Brother    Asthma Paternal Grandmother    Breast cancer Maternal Grandfather    Anesthesia problems Neg Hx     Social  History   Tobacco Use   Smoking status: Former    Pack years: 0.00    Types: Cigarettes    Quit date: 03/27/2014    Years since quitting: 7.2   Smokeless tobacco: Never  Vaping Use   Vaping Use: Never used  Substance Use Topics   Alcohol use: No   Drug use: No    Home Medications Prior to Admission medications   Medication Sig Start Date End Date Taking? Authorizing Provider  acetaminophen (TYLENOL) 500 MG tablet Take 1 tablet (500 mg total) by mouth every 6 (six) hours as needed. Patient not taking: Reported on 04/26/2021 01/16/20   Orvan July, NP  ALAYCEN 1/35 tablet TAKE 1 TABLET BY MOUTH EVERY DAY Patient not taking: No sig reported 08/07/19   Shelly Bombard, MD  busPIRone (BUSPAR) 5 MG tablet Take 1 tablet by mouth 2 (two) times daily. 02/17/21   [provider]  cetirizine (ZYRTEC) 10 MG tablet Take by mouth.    [provider]  cyclobenzaprine (FLEXERIL) 10 MG tablet TAKE 1 TABLET TWICE A DAY AS NEEDED FOR UP TO 5 DAYS FOR MUSCLE SPASM 01/26/20   [provider]  EPINEPHrine 0.3 mg/0.3 mL IJ SOAJ injection Inject 0.3 mg into the muscle as needed (for allergic reaction). Patient not taking: Reported on 04/26/2021    [provider]  EPINEPHrine 0.3 mg/0.3 mL IJ SOAJ injection Inject into the muscle.    [provider]  famotidine (PEPCID) 20 MG tablet Take 1 tablet (20 mg total) by mouth daily. Patient not taking: No sig reported 11/13/19   Tasia Catchings, Amy V, PA-C  Fe Cbn-Fe Gluc-FA-B12-C-DSS (FERRALET 90) 90-1 MG TABS Take 1 tablet by mouth daily before breakfast. Patient not taking: No sig reported 01/08/19   Shelly Bombard, MD  ferrous sulfate 325 (65 FE) MG EC tablet Take 325 mg by mouth 3 (three) times daily. 04/19/21   [provider]  ferrous sulfate 325 (65 FE) MG tablet Take 1 tablet (325 mg total) by mouth daily. Patient not taking: No sig reported 12/29/18   Petrucelli, Samantha R, PA-C  fluticasone (FLONASE) 50 MCG/ACT  nasal spray Place into the nose. 12/21/20   [provider]  ipratropium (ATROVENT) 0.06 % nasal spray Place 2 sprays into both nostrils 4 (four) times daily. Patient not taking: No sig reported 11/13/19  Tasia Catchings, Amy V, PA-C  levETIRAcetam (KEPPRA) 1000 MG tablet Take 1 tablet (1,000 mg total) by mouth 2 (two) times daily for 10 days. Patient taking differently: Take 1,000 mg by mouth 3 (three) times daily.  12/15/19 01/26/20  Ok Edwards, PA-C  levETIRAcetam (KEPPRA) 1000 MG tablet 1 tablet by mouth in the morning and 2 tablets by mouth at bedtime 04/08/21   [provider]  levofloxacin (LEVAQUIN) 500 MG tablet Take 500 mg by mouth daily. 12/21/20   [provider]  meclizine (ANTIVERT) 25 MG tablet Take 1 tablet (25 mg total) by mouth 3 (three) times daily as needed for dizziness. 12/15/19   Tasia Catchings, Amy V, PA-C  medroxyPROGESTERone (DEPO-PROVERA) 150 MG/ML injection INJECT 1 ML (150 MG TOTAL) INTO THE MUSCLE EVERY 3 (THREE) MONTHS. 03/03/20   Shelly Bombard, MD  methocarbamol (ROBAXIN) 500 MG tablet Take 1 tablet (500 mg total) by mouth 2 (two) times daily. 05/23/21   Lacretia Leigh, MD  montelukast (SINGULAIR) 10 MG tablet Take 1 tablet (10 mg total) by mouth at bedtime. Patient not taking: No sig reported 07/03/19   Letta Median K, DO  naproxen (NAPROSYN) 500 MG tablet Take 1 tablet (500 mg total) by mouth 2 (two) times daily with a meal. Patient not taking: Reported on 04/26/2021 08/08/20   Jaynee Eagles, PA-C  predniSONE (DELTASONE) 10 MG tablet Take by mouth. 05/04/21   [provider]  predniSONE (DELTASONE) 20 MG tablet Take 2 tablets daily with breakfast. Patient not taking: Reported on 04/26/2021 03/03/21   Jaynee Eagles, PA-C  prenatal vitamin w/FE, FA (PRENATAL 1 + 1) 27-1 MG TABS tablet Take 1 tablet by mouth daily before breakfast. Patient not taking: No sig reported 01/08/19   Shelly Bombard, MD  promethazine (PHENERGAN) 25 MG tablet Take by mouth. 04/08/21    [provider]  SUMAtriptan (IMITREX) 100 MG tablet Take by mouth. 04/08/21   [provider]  topiramate (TOPAMAX) 25 MG tablet Take 3 tablets (75 mg total) by mouth 2 (two) times daily. Patient not taking: Reported on 04/26/2021 07/07/18   Molpus, Jenny Reichmann, MD  traMADol (ULTRAM) 50 MG tablet Take 1 tablet (50 mg total) by mouth every 6 (six) hours as needed. Patient not taking: Reported on 04/26/2021 01/16/20   Orvan July, NP    Allergies    Green tea leaf ext, Penicillins, Shellfish allergy, Shrimp extract allergy skin test, Sulfa antibiotics, Tea, and Sulfamethoxazole  Review of Systems   Review of Systems  Constitutional:  Negative for fever.  Respiratory:  Positive for shortness of breath.   Cardiovascular:  Positive for chest pain. Negative for leg swelling.  Gastrointestinal:  Positive for nausea and vomiting. Negative for abdominal pain.  All other systems reviewed and are negative.  Physical Exam Updated Vital Signs BP (!) 123/100   Pulse 60   Temp 98.6 F (37 C) (Oral)   Resp 17   Ht 1.651 m (5\' 5" )   Wt 125 kg   SpO2 97%   BMI 45.86 kg/m   Physical Exam CONSTITUTIONAL: Well developed/well nourished, well appearing no acute distress HEAD: Normocephalic/atraumatic EYES: EOMI/PERRL ENMT: Mucous membranes moist NECK: supple no meningeal signs SPINE/BACK:entire spine nontender CV: S1/S2 noted, no murmurs/rubs/gallops noted Chest-no tenderness or crepitus LUNGS: Lungs are clear to auscultation bilaterally, no apparent distress ABDOMEN: soft, nontender, no rebound or guarding, bowel sounds noted throughout abdomen GU:no cva tenderness NEURO: Pt is awake/alert/appropriate, moves all extremitiesx4.  No facial droop.   EXTREMITIES:  pulses normal/equal, full ROM, no calf tenderness or edema SKIN: warm, color normal PSYCH: no abnormalities of mood noted, alert and oriented to situation  ED Results / Procedures / Treatments   Labs (all labs ordered are  listed, but only abnormal results are displayed) Labs Reviewed  CBC WITH DIFFERENTIAL/PLATELET - Abnormal; Notable for the following components:      Result Value   Hemoglobin 11.0 (*)    HCT 34.6 (*)    All other components within normal limits  TROPONIN I (HIGH SENSITIVITY) - Abnormal; Notable for the following components:   Troponin I (High Sensitivity) 60 (*)    All other components within normal limits  RESP PANEL BY RT-PCR (FLU A&B, COVID) ARPGX2  BASIC METABOLIC PANEL  TROPONIN I (HIGH SENSITIVITY)    EKG EKG Interpretation  Date/Time:  Wednesday June 22 2021 00:04:11 EDT Ventricular Rate:  74 PR Interval:  184 QRS Duration: 82 QT Interval:  396 QTC Calculation: 439 R Axis:   49 Text Interpretation: Normal sinus rhythm Cannot rule out Anterior infarct , age undetermined Abnormal ECG No significant change since last tracing Confirmed by Ripley Fraise [phone]) on 06/22/2021 12:07:39 AM  Radiology DG Chest Port 1 View  Result Date: 06/22/2021 CLINICAL DATA:  Chest pain EXAM: PORTABLE CHEST 1 VIEW COMPARISON:  05/23/2021 FINDINGS: The heart size and mediastinal contours are within normal limits. Both lungs are clear. The visualized skeletal structures are unremarkable. IMPRESSION: No active disease. Electronically Signed   By: Ulyses Jarred M.D.   On: 06/22/2021 03:05    Procedures Procedures   Medications Ordered in ED Medications  indomethacin (INDOCIN) capsule 25 mg (has no administration in time range)  fentaNYL (SUBLIMAZE) injection 100 mcg (has no administration in time range)  HYDROcodone-acetaminophen (NORCO/VICODIN) 5-325 MG per tablet 2 tablet (2 tablets Oral Given 06/22/21 0212)    ED Course  I have reviewed the triage vital signs and the nursing notes.  Pertinent labs & imaging results that were available during my care of the patient were reviewed by me and considered in my medical decision making (see chart for details).    MDM  Rules/Calculators/A&P HEAR Score: 2                       This patient presents to the ED for concern of chest pain, this involves an extensive number of treatment options, and is a complaint that carries with it a high risk of complications and morbidity.  The differential diagnosis includes acute coronary syndrome, pulmonary embolism, pericarditis, myocarditis, aortic dissection, pneumonia, pneumothorax   Lab Tests:  I Ordered, reviewed, and interpreted labs, which included troponin, electrolytes, complete blood count  Medicines ordered:  I ordered medication Vicodin for pain  Imaging Studies ordered:  I ordered imaging studies which included chest x-ray  I independently visualized and interpreted imaging which showed no acute findings  Additional history obtained:   Previous records obtained and reviewed   Consultations Obtained:  I consulted cardiology Dr. Hassell Done and discussed lab and imaging findings; she recommends medical admission.  Would also recommend echocardiogram  Reevaluation:  After the interventions stated above, I reevaluated the patient and found patient reports continued chest pain  5:14 AM Patient presented with chest pain.  She reports distant history of pericarditis of unclear etiology.  She reports this chest pain felt worse with lying flat.  EKG was unremarkable, but initial troponin was elevated.  Patient will need further evaluation for myocarditis, and will need  echocardiogram.  Will defer anticoagulation.  She already took aspirin at home, will  Add on Indocin 5:33 AM D/w dr Josephine Cables for admission   Final Clinical Impression(s) / ED Diagnoses Final diagnoses:  Chest pain, rule out acute myocardial infarction  Acute idiopathic myocarditis    Rx / DC Orders ED Discharge Orders     None        Ripley Fraise, MD 06/22/21 [phone]

## 2021-06-22 NOTE — ED Notes (Signed)
Pt does not want enema administered at this time.

## 2021-06-22 NOTE — ED Notes (Signed)
IV team at bedside 

## 2021-06-22 NOTE — Consult Note (Addendum)
Cardiology Consultation:   Patient ID: BLYSS LUGAR MRN: [id number]; DOB: [birth year]  Admit date: 06/22/2021 Date of Consult: 06/22/2021  PCP:  System, Provider Not In   [facility] Prov[facility] Cardiologist:  [place] to [place], remotely saw Dr. Nicki Reaper here to update MD or APP on Care Team, Refresh:1}     Patient Profile:   Nicole [name] is a 45 y.o. female with a hx of sickle cell trait, anemia, asthma, GERD, seizure disorder, obesity, OSA, migraines, fibroid, RA, possible prior pericarditis who is being seen 06/22/2021 for the evaluation of chest pain at the request of Dr. Tana Coast.  History of Present Illness:   Nicole [name] was remotely evaluated by Dr. Meda Coffee in clinic for chest pain in 2015 - EKG and physical exam did not suggest a diagnosis but clinically this was felt to represent pericarditis so she was treated with colchicine and indomethacin. CRP and sed rate were generally unremarkable at that time. 2D echo was normal. She did not have any improvement in her symptoms after 1 month of treatment so stress MRI was ordered but not done due to obtaining IV access. There was also question of chest wall pain around that time. Her symptoms eventually resolved and no further testing was felt needed. She states mother had MI starting at age 98 and father had strokes.  She presented to the ER with complaints of throbbing left sided chest pain and left arm pain with associated nausea, vomiting and lightheadedness. This started yesterday AM at rest and has been intermittent since that time. She notices the pain is much worse when she lies down and she feels as though she cannot breathe. She feels better when she sits up. She states she's been taking a lot of ibuprofen, tylenol and Bayer ASA because she is worried about preventing a heart attack. She also has some discomfort on inspiration. She has had constipation therefore received an enema here in the hospital. Labs show Hgb 11 (c/w prior value in  June), hsTroponin 60 then <2 then <2 but there is currently also an issue with troponin machine calibration being evaluated by lab team. CRP, d-dimer and ESR are normal. CXR NAD. BNP normal. VSS albeit intermittently hypertensive. She was given usual meds as wel as dose of Norco and indomethacin this AM but continues to have waxing/waning pain. No recent fevers or chills.  EKG shows NSR 74bpm very subtle ST upsloping in III, avF, V6 this is very mild - overall reassuring without dramatic change from prior.  Past Medical History:  Diagnosis Date   Anemia    Asthma    Atypical chest pain    a. ?pericarditis 2015.   Blood transfusion 2011   r/t anemia   Fibroid    Migraine    Rheumatoid arthritis ([place])    Seizures ([facility])    Sickle cell trait ([place])    Stroke ([place])    Tooth abscess     Past Surgical History:  Procedure Laterality Date   TUBAL LIGATION Bilateral 2005     Home Medications:  Prior to Admission medications   Medication Sig Start Date End Date Taking? Authorizing Provider  AIMOVIG 140 MG/ML SOAJ Inject 140 mg into the skin every 30 (thirty) days. 05/25/21  Yes [provider]  busPIRone (BUSPAR) 5 MG tablet Take 5 mg by mouth 2 (two) times daily. 02/17/21  Yes [provider]  cetirizine (ZYRTEC) 10 MG tablet Take 10 mg by mouth daily.   Yes [provider]  EPINEPHrine 0.3 mg/0.3 mL IJ SOAJ injection Inject 0.3 mg into the muscle as needed for anaphylaxis (for allergic reaction).   Yes [provider]  Fe Cbn-Fe Gluc-FA-B12-C-DSS (FERRALET 90) 90-1 MG TABS Take 1 tablet by mouth daily before breakfast. 01/08/19  Yes Harper, Charles A, MD  fluticasone (FLONASE) 50 MCG/ACT nasal spray Place 2 sprays into both nostrils daily. 12/21/20  Yes [provider]  levETIRAcetam (KEPPRA) 1000 MG tablet Take 1,000-2,000 mg by mouth See admin instructions. Take 1000mg in the morning and 2000mg at bedtime. 04/08/21  Yes [provider]   metoprolol succinate (TOPROL-XL) 25 MG 24 hr tablet Take 25 mg by mouth daily. 05/23/21  Yes [provider]  montelukast (SINGULAIR) 10 MG tablet Take 1 tablet (10 mg total) by mouth at bedtime. 07/03/19  Yes Cirigliano, Mary K, DO  pantoprazole (PROTONIX) 40 MG tablet Take 40 mg by mouth 2 (two) times daily. 05/24/21 06/23/21 Yes [provider]  promethazine (PHENERGAN) 25 MG tablet Take 25 mg by mouth as needed for nausea or vomiting. 04/08/21  Yes [provider]  SUMAtriptan (IMITREX) 100 MG tablet Take 100 mg by mouth every 2 (two) hours as needed for migraine. 04/08/21  Yes [provider]  Vitamin D, Ergocalciferol, (DRISDOL) 1.25 MG (50000 UNIT) CAPS capsule Take 50,000 Units by mouth once a week. Sundays 06/07/21  Yes [provider]  acetaminophen (TYLENOL) 500 MG tablet Take 1 tablet (500 mg total) by mouth every 6 (six) hours as needed. Patient not taking: No sig reported 01/16/20   Bast, Traci A, NP  ALAYCEN 1/35 tablet TAKE 1 TABLET BY MOUTH EVERY DAY Patient not taking: No sig reported 08/07/19   Harper, Charles A, MD  famotidine (PEPCID) 20 MG tablet Take 1 tablet (20 mg total) by mouth daily. Patient not taking: No sig reported 11/13/19   Yu, Amy V, PA-C  ferrous sulfate 325 (65 FE) MG tablet Take 1 tablet (325 mg total) by mouth daily. Patient not taking: No sig reported 12/29/18   Petrucelli, Samantha R, PA-C  ipratropium (ATROVENT) 0.06 % nasal spray Place 2 sprays into both nostrils 4 (four) times daily. Patient not taking: No sig reported 11/13/19   Yu, Amy V, PA-C  levETIRAcetam (KEPPRA) 1000 MG tablet Take 1 tablet (1,000 mg total) by mouth 2 (two) times daily for 10 days. Patient not taking: Reported on 06/22/2021 12/15/19 06/22/21  Yu, Amy V, PA-C  meclizine (ANTIVERT) 25 MG tablet Take 1 tablet (25 mg total) by mouth 3 (three) times daily as needed for dizziness. Patient not taking: Reported on 06/22/2021 12/15/19   Yu, Amy V, PA-C   medroxyPROGESTERone (DEPO-PROVERA) 150 MG/ML injection INJECT 1 ML (150 MG TOTAL) INTO THE MUSCLE EVERY 3 (THREE) MONTHS. Patient not taking: No sig reported 03/03/20   Harper, Charles A, MD  methocarbamol (ROBAXIN) 500 MG tablet Take 1 tablet (500 mg total) by mouth 2 (two) times daily. Patient not taking: Reported on 06/22/2021 05/23/21   Allen, Anthony, MD  naproxen (NAPROSYN) 500 MG tablet Take 1 tablet (500 mg total) by mouth 2 (two) times daily with a meal. Patient not taking: No sig reported 08/08/20   Mani, Mario, PA-C  prenatal vitamin w/FE, FA (PRENATAL 1 + 1) 27-1 MG TABS tablet Take 1 tablet by mouth daily before breakfast. Patient not taking: No sig reported 01/08/19   Harper, Charles A, MD  topiramate (TOPAMAX) 25 MG tablet Take 3 tablets (75 mg total) by mouth 2 (two) times   daily. Patient not taking: No sig reported 07/07/18   Molpus, Jenny Reichmann, MD  traMADol (ULTRAM) 50 MG tablet Take 1 tablet (50 mg total) by mouth every 6 (six) hours as needed. Patient not taking: No sig reported 01/16/20   Orvan July, NP    Inpatient Medications: Scheduled Meds:  busPIRone  5 mg Oral BID   fluticasone  1 spray Each Nare Daily   heparin  5,000 Units Subcutaneous Q8H   [START ON 06/23/2021] levETIRAcetam  1,000 mg Oral Daily   levETIRAcetam  2,000 mg Oral QHS   methocarbamol  500 mg Oral BID   montelukast  10 mg Oral QHS   pantoprazole  40 mg Oral BID AC   polyethylene glycol  17 g Oral Daily   senna-docusate  1 tablet Oral BID   Continuous Infusions:  PRN Meds: acetaminophen, HYDROmorphone (DILAUDID) injection, ondansetron (ZOFRAN) IV, oxyCODONE  Allergies:    Allergies  Allergen Reactions   Green Tea Leaf Ext Other (See Comments)    All teas   Penicillins Anaphylaxis, Swelling and Rash    Has patient had a PCN reaction causing immediate rash, facial/tongue/throat swelling, SOB or lightheadedness with hypotension: Yes Has patient had a PCN reaction causing severe rash involving mucus  membranes or skin necrosis: Yes Has patient had a PCN reaction that required hospitalization Yes- ed visit Has patient had a PCN reaction occurring within the last 10 years: No-childhood allergy If all of the above answers are "NO", then may proceed with Cephalosporin use.  Throat Swelling Throat Swelling Has patient had a PCN reaction causing immediate rash, facial/tongue/throat swelling, SOB or lightheadedness with hypotension: Yes Has patient had a PCN reaction causing severe rash involving mucus membranes or skin necrosis: Yes Has patient had a PCN reaction that required hospitalization Yes- ed visit Has patient had a PCN reaction occurring within the last 10 years: No-childhood allergy If all of the above answers are "NO", then may proceed with Cephalosporin use.  Throat Swelling   Shellfish Allergy Anaphylaxis   Shrimp Extract Allergy Skin Test Other (See Comments)   Sulfa Antibiotics Anaphylaxis, Swelling and Rash   Tea Anaphylaxis    All teas   Sulfamethoxazole Swelling, Rash and Other (See Comments)    Throat Swelling    Social History:   Social History   Socioeconomic History   Marital status: Married    Spouse name: Not on file   Number of children: Not on file   Years of education: Not on file   Highest education level: Not on file  Occupational History   Not on file  Tobacco Use   Smoking status: Former    Pack years: 0.00    Types: Cigarettes    Quit date: 03/27/2014    Years since quitting: 7.2   Smokeless tobacco: Never  Vaping Use   Vaping Use: Never used  Substance and Sexual Activity   Alcohol use: No   Drug use: No   Sexual activity: Yes    Partners: Male    Birth control/protection: Condom, Injection, Surgical  Other Topics Concern   Not on file  Social History Narrative   Not on file   Social Determinants of Health   Financial Resource Strain: Not on file  Food Insecurity: Not on file  Transportation Needs: No Transportation Needs   Lack  of Transportation (Medical): No   Lack of Transportation (Non-Medical): No  Physical Activity: Not on file  Stress: Not on file  Social Connections: Not on file  Intimate Partner Violence: Not on file    Family History:    Family History  Problem Relation Age of Onset   Hypertension Mother    Diabetes Mother    Cancer Mother        Colon, brain, 2 other   Asthma Mother    Clotting disorder Mother    Heart disease Mother        1995, stents   Stroke Mother        2012   Breast cancer Mother 35   Diabetes Father    Asthma Father    Clotting disorder Father    Sickle cell anemia Father    Heart disease Father        2000, stents   Hypertension Father    Stroke Father        2007   Diabetes Maternal Grandmother    Breast cancer Maternal Grandmother    Asthma Sister    Asthma Brother    Asthma Paternal Grandmother    Breast cancer Maternal Grandfather    Anesthesia problems Neg Hx      ROS:  Please see the history of present illness.  No fevers or chills. All other ROS reviewed and negative.     Physical Exam/Data:   Vitals:   06/22/21 0900 06/22/21 0930 06/22/21 1240 06/22/21 1330  BP: (!) 134/93 (!) 142/96 (!) 148/95 111/70  Pulse:  (!) 58 62   Resp:  20 18   Temp:      TempSrc:      SpO2:  95% 100%   Weight:      Height:       No intake or output data in the 24 hours ending 06/22/21 1447 Last 3 Weights 06/21/2021 05/23/2021 04/26/2021  Weight (lbs) 275 lb 9.6 oz 265 lb 273 lb 3.2 oz  Weight (kg) 125.011 kg 120.203 kg 123.923 kg     Body mass index is 45.86 kg/m.  Per MD  EKG:  The EKG was personally reviewed and demonstrates:  EKG shows NSR 74bpm very subtle ST upsloping in III, avF, V6 this is very mild - overall reassuring without dramatic change from prior.  Telemetry:  Telemetry was personally reviewed and demonstrates:  per MD  Relevant CV Studies: 2D Echo 02/2014 - Left ventricle: The cavity size was normal. Wall thickness    was normal.  Systolic function was normal. The estimated    ejection fraction was in the range of 60% to 65%. Wall    motion was normal; there were no regional wall motion    abnormalities. Left ventricular diastolic function    parameters were normal.  - Inferior vena cava: The vessel was normal in size; the    respirophasic diameter changes were in the normal range (=    50%); findings are consistent with normal central venous    pressure.  - Pericardium, extracardiac: The pericardium was normal in    appearance. There was no pericardial effusion.  Laboratory Data:  High Sensitivity Troponin:   Recent Labs  Lab 06/22/21 0245 06/22/21 0449 06/22/21 1017  TROPONINIHS 60* <2 <2     Chemistry Recent Labs  Lab 06/22/21 0245  NA 139  K 3.6  CL 106  CO2 26  GLUCOSE 92  BUN 9  CREATININE 0.91  CALCIUM 9.0  GFRNONAA >60  ANIONGAP 7    No results for input(s): PROT, ALBUMIN, AST, ALT, ALKPHOS, BILITOT in the last 168 hours. Hematology Recent Labs  Lab 06/22/21 0245    WBC 7.4  RBC 3.94  HGB 11.0*  HCT 34.6*  MCV 87.8  MCH 27.9  MCHC 31.8  RDW 14.1  PLT 290   BNP Recent Labs  Lab 06/22/21 1017  BNP 42.8    DDimer  Recent Labs  Lab 06/22/21 1017  DDIMER 0.39     Radiology/Studies:  DG Chest Port 1 View  Result Date: 06/22/2021 CLINICAL DATA:  Chest pain EXAM: PORTABLE CHEST 1 VIEW COMPARISON:  05/23/2021 FINDINGS: The heart size and mediastinal contours are within normal limits. Both lungs are clear. The visualized skeletal structures are unremarkable. IMPRESSION: No active disease. Electronically Signed   By: Kevin  Herman M.D.   On: 06/22/2021 03:05     Assessment and Plan:   1. Chest pain with features suspicious for pericarditis - symptoms sound suspicious for pericarditis - inflammatory markers are negative - further confounding this, however, is prior chest pain similar to this episode in 2015 that did not resolve with rx of pericarditis nor had any confirming  features of such - d-dimer also negative - troponin values unreliable - there is currently also an issue with troponin machine calibration being evaluated by lab team - echo report pending, would be helpful to know structurally if any abnormalities or effusion - will review further management and evaluation with MD  2. Elevated BP without prior diagnosis of HTN - may be driven by pain, follow and consider rx if remains persistent issue  3. Mild anemia - similar to baseline value   Risk Assessment/Risk Scores:     HEAR Score (for undifferentiated chest pain):  HEAR Score: 3   For questions or updates, please contact [facility] Please consult [email or web address] for contact info under    Signed,  N , PA-C remotely prepped/[facility] 06/22/2021 2:47 PM  

## 2021-06-22 NOTE — ED Notes (Signed)
Delay in blood draw d/t inability of this writer to obtain specimen.  CN to bedside to attempt.

## 2021-06-22 NOTE — ED Notes (Signed)
Pt given basin and hygiene products.  Pt stated she wanted to clean up prior to enema administration.

## 2021-06-22 NOTE — Progress Notes (Signed)
Cardiology f/u arranged and placed on AVS.

## 2021-06-22 NOTE — ED Notes (Signed)
Pt had a large formed BM.

## 2021-06-22 NOTE — ED Notes (Signed)
Patient is in room in gown, with husband at bedside. States she continues to have chest pain that radiates down the left arm. When chest pain started she was nauseous and vomited twice. Currently still feels nauseous. Patient also says she cant breath when laying flat.

## 2021-06-22 NOTE — H&P (Signed)
History and Physical        Hospital Admission Note Date: 06/22/2021  Patient name: Nicole [name] Medical record number: [id number] Date of birth: 10/18/1976 Age: 45 y.o. Gender: female  PCP: Patient follows [facility], Grier Rocher PA-C    Patient coming from: Home  I have reviewed all records in the [facility].    Chief Complaint:  Left-sided chest pain and arm pain since yesterday  HPI: Patient is a 45 year old female with prior history of pericarditis, asthma, seizures, GERD, rheumatoid arthritis, migraines presented to ED with chest pain that started yesterday, 1 day prior to admission.  Patient reported that chest pain started around 8:30 AM in the morning, described as 8/10, intermittent, sharp, left-sided radiating to her left arm and jaw.  Patient reported that she also had shortness of breath, worse on lying flat and exertion.  She said it with 2 episodes of vomiting yesterday, no hematemesis.  No diaphoresis.  No prior history of coronary artery disease however had history of prior pericarditis in 2015. No fevers or chills, cough, abdominal pain.  Also reports constipation, last BM per patient over a week ago.  She has scheduled colonoscopy in August at [facility].   ED work-up/course:  In ED, temp 98.6, respiratory 19, pulse 56, BP 134/93, O2 sats 95% on room air Initial troponin 60-> second troponin <2 EKG showed rate 74, normal sinus rhythm, no acute ST-T wave changes suggestive of ischemia, QTc 439. Chest x-ray showed no acute disease  Review of Systems: Positives marked in 'bold' Constitutional: Denies fever, chills, diaphoresis, poor appetite and fatigue.  HEENT: Denies photophobia, eye pain, redness, hearing loss, ear pain, congestion, sore throat, rhinorrhea, sneezing, mouth sores, trouble swallowing, neck pain, neck stiffness and  tinnitus.   Respiratory: Please see HPI Cardiovascular: Please see HPI Gastrointestinal: +nausea, vomiting, constipation, no abdominal pain blood in stool and abdominal distention.  Genitourinary: Denies dysuria, urgency, frequency, hematuria, flank pain and difficulty urinating.  Musculoskeletal: Denies myalgias, back pain, joint swelling, arthralgias and gait problem.  Skin: Denies pallor, rash and wound.  Neurological: Denies dizziness, seizures, syncope, weakness, light-headedness, numbness and headaches.  Hematological: Denies adenopathy. Easy bruising, personal or family bleeding history  Psychiatric/Behavioral: Denies suicidal ideation, mood changes, confusion, nervousness, sleep disturbance and agitation  Past Medical History: Past Medical History:  Diagnosis Date   Anemia    Asthma    Atypical chest pain    a. ?pericarditis 2015.   Blood transfusion 2011   r/t anemia   Fibroid    Migraine    Seizures (HCC)    Sickle cell trait ([place])    Stroke [facility])    Tooth abscess     Past Surgical History:  Procedure Laterality Date   TUBAL LIGATION Bilateral 2005    Medications: Prior to Admission medications   Medication Sig Start Date End Date Taking? Authorizing Provider  acetaminophen (TYLENOL) 500 MG tablet Take 1 tablet (500 mg total) by mouth every 6 (six) hours as needed. Patient not taking: Reported on 04/26/2021 01/16/20   Orvan July, NP  ALAYCEN 1/35 tablet TAKE 1 TABLET BY MOUTH EVERY DAY Patient not taking:  No sig reported 08/07/19   Shelly Bombard, MD  busPIRone (BUSPAR) 5 MG tablet Take 1 tablet by mouth 2 (two) times daily. 02/17/21   [provider]  cetirizine (ZYRTEC) 10 MG tablet Take by mouth.    [provider]  cyclobenzaprine (FLEXERIL) 10 MG tablet TAKE 1 TABLET TWICE A DAY AS NEEDED FOR UP TO 5 DAYS FOR MUSCLE SPASM 01/26/20   [provider]  EPINEPHrine 0.3 mg/0.3 mL IJ SOAJ injection Inject 0.3 mg into the muscle as needed  (for allergic reaction). Patient not taking: Reported on 04/26/2021    [provider]  EPINEPHrine 0.3 mg/0.3 mL IJ SOAJ injection Inject into the muscle.    [provider]  famotidine (PEPCID) 20 MG tablet Take 1 tablet (20 mg total) by mouth daily. Patient not taking: No sig reported 11/13/19   Tasia Catchings, Amy V, PA-C  Fe Cbn-Fe Gluc-FA-B12-C-DSS (FERRALET 90) 90-1 MG TABS Take 1 tablet by mouth daily before breakfast. Patient not taking: No sig reported 01/08/19   Shelly Bombard, MD  ferrous sulfate 325 (65 FE) MG EC tablet Take 325 mg by mouth 3 (three) times daily. 04/19/21   [provider]  ferrous sulfate 325 (65 FE) MG tablet Take 1 tablet (325 mg total) by mouth daily. Patient not taking: No sig reported 12/29/18   Petrucelli, Samantha R, PA-C  fluticasone (FLONASE) 50 MCG/ACT nasal spray Place into the nose. 12/21/20   [provider]  ipratropium (ATROVENT) 0.06 % nasal spray Place 2 sprays into both nostrils 4 (four) times daily. Patient not taking: No sig reported 11/13/19   Ok Edwards, PA-C  levETIRAcetam (KEPPRA) 1000 MG tablet Take 1 tablet (1,000 mg total) by mouth 2 (two) times daily for 10 days. Patient taking differently: Take 1,000 mg by mouth 3 (three) times daily.  12/15/19 01/26/20  Ok Edwards, PA-C  levETIRAcetam (KEPPRA) 1000 MG tablet 1 tablet by mouth in the morning and 2 tablets by mouth at bedtime 04/08/21   [provider]  levofloxacin (LEVAQUIN) 500 MG tablet Take 500 mg by mouth daily. 12/21/20   [provider]  meclizine (ANTIVERT) 25 MG tablet Take 1 tablet (25 mg total) by mouth 3 (three) times daily as needed for dizziness. 12/15/19   Tasia Catchings, Amy V, PA-C  medroxyPROGESTERone (DEPO-PROVERA) 150 MG/ML injection INJECT 1 ML (150 MG TOTAL) INTO THE MUSCLE EVERY 3 (THREE) MONTHS. 03/03/20   Shelly Bombard, MD  methocarbamol (ROBAXIN) 500 MG tablet Take 1 tablet (500 mg total) by mouth 2 (two) times daily. 05/23/21   Lacretia Leigh,  MD  montelukast (SINGULAIR) 10 MG tablet Take 1 tablet (10 mg total) by mouth at bedtime. Patient not taking: No sig reported 07/03/19   Letta Median K, DO  naproxen (NAPROSYN) 500 MG tablet Take 1 tablet (500 mg total) by mouth 2 (two) times daily with a meal. Patient not taking: Reported on 04/26/2021 08/08/20   Jaynee Eagles, PA-C  predniSONE (DELTASONE) 10 MG tablet Take by mouth. 05/04/21   [provider]  predniSONE (DELTASONE) 20 MG tablet Take 2 tablets daily with breakfast. Patient not taking: Reported on 04/26/2021 03/03/21   Jaynee Eagles, PA-C  prenatal vitamin w/FE, FA (PRENATAL 1 + 1) 27-1 MG TABS tablet Take 1 tablet by mouth daily before breakfast. Patient not taking: No sig reported 01/08/19   Shelly Bombard, MD  promethazine (PHENERGAN) 25 MG tablet Take by mouth. 04/08/21   [provider]  SUMAtriptan (  IMITREX) 100 MG tablet Take by mouth. 04/08/21   [provider]  topiramate (TOPAMAX) 25 MG tablet Take 3 tablets (75 mg total) by mouth 2 (two) times daily. Patient not taking: Reported on 04/26/2021 07/07/18   Molpus, Jenny Reichmann, MD  traMADol (ULTRAM) 50 MG tablet Take 1 tablet (50 mg total) by mouth every 6 (six) hours as needed. Patient not taking: Reported on 04/26/2021 01/16/20   Orvan July, NP    Allergies:   Allergies  Allergen Reactions   Green Tea Leaf Ext Other (See Comments)    All teas   Penicillins Anaphylaxis, Swelling and Rash    Has patient had a PCN reaction causing immediate rash, facial/tongue/throat swelling, SOB or lightheadedness with hypotension: Yes Has patient had a PCN reaction causing severe rash involving mucus membranes or skin necrosis: Yes Has patient had a PCN reaction that required hospitalization Yes- ed visit Has patient had a PCN reaction occurring within the last 10 years: No-childhood allergy If all of the above answers are "NO", then may proceed with Cephalosporin use.  Throat Swelling   Shellfish Allergy  Anaphylaxis   Shrimp Extract Allergy Skin Test Other (See Comments)   Sulfa Antibiotics Anaphylaxis, Swelling and Rash   Tea Anaphylaxis    All teas   Sulfamethoxazole Rash and Swelling    Throat Swelling    Social History:  reports that she quit smoking about 7 years ago. She has never used smokeless tobacco. She reports that she does not drink alcohol and does not use drugs.  Family History: Family History  Problem Relation Age of Onset   Hypertension Mother    Diabetes Mother    Cancer Mother        Colon, brain, 2 other   Asthma Mother    Clotting disorder Mother    Heart disease Mother        84, stents   Stroke Mother        2012   Breast cancer Mother 28   Diabetes Father    Asthma Father    Clotting disorder Father    Sickle cell anemia Father    Heart disease Father        2000, stents   Hypertension Father    Stroke Father        2007   Diabetes Maternal Grandmother    Breast cancer Maternal Grandmother    Asthma Sister    Asthma Brother    Asthma Paternal Grandmother    Breast cancer Maternal Grandfather    Anesthesia problems Neg Hx     Physical Exam: Blood pressure (!) 134/93, pulse (!) 51, temperature 98.6 F (37 C), temperature source Oral, resp. rate 18, height 5' 5" (1.651 m), weight 125 kg, SpO2 93 %. General: Alert, awake, oriented x3, in no acute distress. Eyes: pink conjunctiva,anicteric sclera, pupils equal and reactive to light and accomodation, HEENT: normocephalic, atraumatic, oropharynx clear Neck: supple, no masses or lymphadenopathy, no goiter, no bruits, no JVD CVS: Regular rate and rhythm, without murmurs, rubs or gallops. No lower extremity edema Resp : Clear to auscultation bilaterally, no wheezing, rales or rhonchi. GI : Soft, nontender, nondistended, positive bowel sounds, No hernia.  Musculoskeletal: No clubbing or cyanosis, positive pedal pulses. No contracture. ROM intact  Neuro: Grossly intact, no focal neurological  deficits, strength 5/5 upper and lower extremities bilaterally Psych: alert and oriented x 3, normal mood and affect Skin: no rashes or lesions, warm and dry   LABS on Admission: I  have personally reviewed all the labs and imagings below    Basic Metabolic Panel: Recent Labs  Lab 06/22/21 0245  NA 139  K 3.6  CL 106  CO2 26  GLUCOSE 92  BUN 9  CREATININE 0.91  CALCIUM 9.0   Liver Function Tests: No results for input(s): AST, ALT, ALKPHOS, BILITOT, PROT, ALBUMIN in the last 168 hours. No results for input(s): LIPASE, AMYLASE in the last 168 hours. No results for input(s): AMMONIA in the last 168 hours. CBC: Recent Labs  Lab 06/22/21 0245  WBC 7.4  NEUTROABS 3.3  HGB 11.0*  HCT 34.6*  MCV 87.8  PLT 290   Cardiac Enzymes: No results for input(s): CKTOTAL, CKMB, CKMBINDEX, TROPONINI in the last 168 hours. BNP: Invalid input(s): POCBNP CBG: No results for input(s): GLUCAP in the last 168 hours.  Radiological Exams on Admission:  DG Chest Port 1 View  Result Date: 06/22/2021 CLINICAL DATA:  Chest pain EXAM: PORTABLE CHEST 1 VIEW COMPARISON:  05/23/2021 FINDINGS: The heart size and mediastinal contours are within normal limits. Both lungs are clear. The visualized skeletal structures are unremarkable. IMPRESSION: No active disease. Electronically Signed   By: Ulyses Jarred M.D.   On: 06/22/2021 03:05      EKG: Independently reviewed. EKG showed rate 74, normal sinus rhythm, no acute ST-T wave changes suggestive of ischemia, QTc 439.   Assessment/Plan Principal Problem:   Chest pain -Prior history of pericarditis, presenting with left-sided chest pain associated with nausea, vomiting, shortness of breath -First troponin 60, second troponin negative, no acute ST-T wave changes obtain ESR, CRP, D-dimer, BNP -Trend troponins, follow 2D echocardiogram, cardiology consulted   Active Problems:   GERD (gastroesophageal reflux disease) -Continue PPI 40 mg twice daily  (reviewed GI note 05/24/21) from [facility]), outpatient work-up in progress  Severe constipation - will add smog enema x 1, bowel regimen. -Patient reports she follows gastroenterology, has a scheduled EGD and colonoscopy for further work-up in August at [facility].     Seizure [facility]) -Currently stable, resume Keppra at home dose, 1000 mg in a.m., 2000 mg at bedtime  History of rheumatoid arthritis -Currently not on prednisone, continue Robaxin as needed, follows rheumatology outpatient    Obesity -Counseled on diet and weight control  History of iron deficiency anemia -Per patient she receives iron infusion [facility], last infusion on 06/16/2021   DVT prophylaxis: Heparin subcu  CODE STATUS: Full CODE STATUS, addressed with the patient  Consults called: Cardiology  Family Communication: Admission, patients condition and plan of care including tests being ordered have been discussed with the patien who indicates understanding and agree with the plan and Code Status  Admission status:   The medical decision making on this patient was of high complexity and the patient is at high risk for clinical deterioration, therefore this is a level 3 admission.  Severity of Illness:      The appropriate patient status for this patient is OBSERVATION. Observation status is judged to be reasonable and necessary in order to provide the required intensity of service to ensure the patient's safety. The patient's presenting symptoms, physical exam findings, and initial radiographic and laboratory data in the context of their medical condition is felt to place them at decreased risk for further clinical deterioration. Furthermore, it is anticipated that the patient will be medically stable for discharge from the hospital within 2 midnights of admission. The following factors support the patient status of observation.   " The patient's  presenting symptoms include left-sided chest pain with  shortness of breath, nausea and vomiting " The physical exam findings include chest pain " The initial radiographic and laboratory data are initial troponin positive  Time Spent on Admission: 70 minutes     Dimitry Holsworth M.D. [facility] 06/22/2021, 9:33 AM

## 2021-06-23 DIAGNOSIS — R0789 Other chest pain: Secondary | ICD-10-CM

## 2021-06-23 LAB — COMPREHENSIVE METABOLIC PANEL
ALT: 16 U/L (ref 0–44)
AST: 16 U/L (ref 15–41)
Albumin: 3.6 g/dL (ref 3.5–5.0)
Alkaline Phosphatase: 60 U/L (ref 38–126)
Anion gap: 6 (ref 5–15)
BUN: 8 mg/dL (ref 6–20)
CO2: 25 mmol/L (ref 22–32)
Calcium: 8.7 mg/dL — ABNORMAL LOW (ref 8.9–10.3)
Chloride: 110 mmol/L (ref 98–111)
Creatinine, Ser: 0.78 mg/dL (ref 0.44–1.00)
GFR, Estimated: >60 mL/min (ref >60)
Glucose, Bld: 92 mg/dL (ref 70–99)
Potassium: 3.9 mmol/L (ref 3.5–5.1)
Sodium: 141 mmol/L (ref 135–145)
Total Bilirubin: 0.6 mg/dL (ref 0.3–1.2)
Total Protein: 6.5 g/dL (ref 6.5–8.1)

## 2021-06-23 LAB — CBC
HCT: 35.4 % — ABNORMAL LOW (ref 36.0–46.0)
Hemoglobin: 11.2 g/dL — ABNORMAL LOW (ref 12.0–15.0)
MCH: 27.5 pg (ref 26.0–34.0)
MCHC: 31.6 g/dL (ref 30.0–36.0)
MCV: 87 fL (ref 80.0–100.0)
Platelets: 260 10*3/uL (ref 150–400)
RBC: 4.07 MIL/uL (ref 3.87–5.11)
RDW: 14.3 % (ref 11.5–15.5)
WBC: 5.2 10*3/uL (ref 4.0–10.5)
nRBC: 0 % (ref 0.0–0.2)

## 2021-06-23 LAB — TROPONIN I (HIGH SENSITIVITY): Troponin I (High Sensitivity): <2 ng/L (ref <18)

## 2021-06-23 LAB — CREATININE, SERUM
Creatinine, Ser: 0.72 mg/dL (ref 0.44–1.00)
GFR, Estimated: >60 mL/min (ref >60)

## 2021-06-23 LAB — HIV ANTIBODY (ROUTINE TESTING W REFLEX): HIV Screen 4th Generation wRfx: NONREACTIVE

## 2021-06-23 MED ORDER — PROMETHAZINE HCL 25 MG PO TABS: 25.0000 mg | ORAL_TABLET | ORAL | Status: DC | PRN

## 2021-06-23 MED ORDER — PANTOPRAZOLE SODIUM 40 MG PO TBEC: 40.0000 mg | DELAYED_RELEASE_TABLET | Freq: Two times a day (BID) | ORAL | Status: DC

## 2021-06-23 MED ORDER — LORATADINE 10 MG PO TABS: 10.0000 mg | ORAL_TABLET | Freq: Every day | ORAL | Status: DC

## 2021-06-23 MED ORDER — METOPROLOL SUCCINATE ER 50 MG PO TB24: 25.0000 mg | ORAL_TABLET | Freq: Every day | ORAL | Status: DC

## 2021-06-23 NOTE — Discharge Summary (Signed)
Physician Discharge Summary  Nicole [name] OIZ:[id number] DOB: 1976/09/22 DOA: 06/22/2021  PCP: System, Provider Not In  Admit date: 06/22/2021 Discharge date: 06/23/2021  Admitted From: home Disposition:  home  Recommendations for Outpatient Follow-up:  Follow up with PCP in 1-2 weeks Please obtain BMP/CBC in one week  Home Health:no  Equipment/Devices: none  Discharge Condition: Stable Code Status:   Code Status: Full Code Diet recommendation:  Diet Order             Diet regular Room service appropriate? Yes; Fluid consistency: Thin  Diet effective now           Diet - low sodium heart healthy                   Brief/Interim Summary: 30 old female with history of pericarditis, asthma, seizure disorder, GERD, RA, migraine presented to the ED with chest pain onset 1 day prior to admission around 8:30 AM in the morning sharp intermittent with radiation to the left arm and jaw, with shortness of breath worse on laying flat and exertion along with 2 episodes of vomiting. In the ED vitals are stable initial troponin 60 but repeat troponin has been negative less than 2 EKG normal sinus rhythm no acute ST-T wave changes chest x-ray no active disease Cardiology was consulted patient was admitted given history of pericarditis Cardiology felt elevated troponin.  Rest of the troponins are negative no further work-up advised, patient did have echocardiogram-showed normal EF 55 to 60%, no regional WMA, no pericardial effusion. Cleared for discharge home with outpatient follow-up Discharge Diagnoses:   Chest pain: Atypical initial positive troponin was a spurious due to lab error.  Echo unremarkable seen by cardiology no further cardiac work-up advised.  Patient feels well and wants to go home today  Severe constipation given enema x1 she has a scheduled EGD and colonoscopy for further work-up in August at [facility] as per her outpatient GI  History of iron deficiency anemia she  receives iron infusion at [facility], has outpatient GI work-up pending next month  GERD stable, can continue her PPI History of RA continue outpatient follow-up Seizure disorder continue her Keppra Morbid obesity with BMI 45 will benefit with outpatient weight loss PCP follow-up healthy lifestyle and OSA eval    Consults: cardiology  Subjective: Resting comfortably had bowel movement feels well. No new complaints.  Discharge Exam: Vitals:   06/23/21 0600 06/23/21 0714  BP: (!) 124/93 130/88  Pulse: 62 77  Resp: 18 20  Temp:  97.9 F (36.6 C)  SpO2: 98% 97%   General: Pt is alert, awake, not in acute distress Cardiovascular: RRR, S1/S2 +, no rubs, no gallops Respiratory: CTA bilaterally, no wheezing, no rhonchi Abdominal: Soft, NT, ND, bowel sounds + Extremities: no edema, no cyanosis  Discharge Instructions  Discharge Instructions     Diet - low sodium heart healthy   Complete by: As directed    Discharge instructions   Complete by: As directed    Please call call MD or return to ER for similar or worsening recurring problem that brought you to hospital or if any fever,nausea/vomiting,abdominal pain, uncontrolled pain, chest pain,  shortness of breath or any other alarming symptoms.  Please follow-up your doctor as instructed in a week time and call the office for appointment.  Please avoid alcohol, smoking, or any other illicit substance and maintain healthy habits including taking your regular medications as prescribed.  You were cared for by a  hospitalist during your hospital stay. If you have any questions about your discharge medications or the care you received while you were in the hospital after you are discharged, you can call the unit and ask to speak with the hospitalist on call if the hospitalist that took care of you is not available.  Once you are discharged, your primary care physician will handle any further medical issues. Please note that NO REFILLS  for any discharge medications will be authorized once you are discharged, as it is imperative that you return to your primary care physician (or establish a relationship with a primary care physician if you do not have one) for your aftercare needs so that they can reassess your need for medications and monitor your lab values   Increase activity slowly   Complete by: As directed       Allergies as of 06/23/2021       Reactions   Green Tea Leaf Ext Other (See Comments)   All teas   Penicillins Anaphylaxis, Swelling, Rash   Has patient had a PCN reaction causing immediate rash, facial/tongue/throat swelling, SOB or lightheadedness with hypotension: Yes Has patient had a PCN reaction causing severe rash involving mucus membranes or skin necrosis: Yes Has patient had a PCN reaction that required hospitalization Yes- ed visit Has patient had a PCN reaction occurring within the last 10 years: No-childhood allergy If all of the above answers are "NO", then may proceed with Cephalosporin use. Throat Swelling Throat Swelling Has patient had a PCN reaction causing immediate rash, facial/tongue/throat swelling, SOB or lightheadedness with hypotension: Yes Has patient had a PCN reaction causing severe rash involving mucus membranes or skin necrosis: Yes Has patient had a PCN reaction that required hospitalization Yes- ed visit Has patient had a PCN reaction occurring within the last 10 years: No-childhood allergy If all of the above answers are "NO", then may proceed with Cephalosporin use. Throat Swelling   Shellfish Allergy Anaphylaxis   Shrimp Extract Allergy Skin Test Other (See Comments)   Sulfa Antibiotics Anaphylaxis, Swelling, Rash   Tea Anaphylaxis   All teas   Sulfamethoxazole Swelling, Rash, Other (See Comments)   Throat Swelling        Medication List     STOP taking these medications    famotidine 20 MG tablet Commonly known as: PEPCID       TAKE these medications     Aimovig 140 MG/ML Soaj Generic drug: Erenumab-aooe Inject 140 mg into the skin every 30 (thirty) days.   busPIRone 5 MG tablet Commonly known as: BUSPAR Take 5 mg by mouth 2 (two) times daily.   cetirizine 10 MG tablet Commonly known as: ZYRTEC Take 10 mg by mouth daily.   EPINEPHrine 0.3 mg/0.3 mL Soaj injection Commonly known as: EPI-PEN Inject 0.3 mg into the muscle as needed for anaphylaxis (for allergic reaction).   Ferralet 90 90-1 MG Tabs Take 1 tablet by mouth daily before breakfast.   fluticasone 50 MCG/ACT nasal spray Commonly known as: FLONASE Place 2 sprays into both nostrils daily.   levETIRAcetam 1000 MG tablet Commonly known as: KEPPRA Take 1 tablet (1,000 mg total) by mouth 2 (two) times daily for 10 days.   levETIRAcetam 1000 MG tablet Commonly known as: KEPPRA Take 1,000-2,000 mg by mouth See admin instructions. Take 1000mg  in the morning and 2000mg  at bedtime.   metoprolol succinate 25 MG 24 hr tablet Commonly known as: TOPROL-XL Take 25 mg by mouth daily.   montelukast 10  MG tablet Commonly known as: SINGULAIR Take 1 tablet (10 mg total) by mouth at bedtime.   pantoprazole 40 MG tablet Commonly known as: PROTONIX Take 40 mg by mouth 2 (two) times daily.   promethazine 25 MG tablet Commonly known as: PHENERGAN Take 25 mg by mouth as needed for nausea or vomiting.   SUMAtriptan 100 MG tablet Commonly known as: IMITREX Take 100 mg by mouth every 2 (two) hours as needed for migraine.   Vitamin D (Ergocalciferol) 1.25 MG (50000 UNIT) Caps capsule Commonly known as: DRISDOL Take 50,000 Units by mouth once a week. Sundays       ASK your doctor about these medications    methocarbamol 500 MG tablet Commonly known as: ROBAXIN Take 1 tablet (500 mg total) by mouth 2 (two) times daily.        Follow-up Information     Isaiah Serge, NP Follow up.   Specialties: Cardiology, Radiology Why: [place] location - a  cardiology follow-up has been arranged for you on Thursday August 18 at 2:15 PM (Arrive by 2:00 PM). Mickel Baas is one of our nurse practitioners with our team. Contact information: [address] [place] [address] [phone]                Allergies  Allergen Reactions   Green Tea Leaf Ext Other (See Comments)    All teas   Penicillins Anaphylaxis, Swelling and Rash    Has patient had a PCN reaction causing immediate rash, facial/tongue/throat swelling, SOB or lightheadedness with hypotension: Yes Has patient had a PCN reaction causing severe rash involving mucus membranes or skin necrosis: Yes Has patient had a PCN reaction that required hospitalization Yes- ed visit Has patient had a PCN reaction occurring within the last 10 years: No-childhood allergy If all of the above answers are "NO", then may proceed with Cephalosporin use.  Throat Swelling Throat Swelling Has patient had a PCN reaction causing immediate rash, facial/tongue/throat swelling, SOB or lightheadedness with hypotension: Yes Has patient had a PCN reaction causing severe rash involving mucus membranes or skin necrosis: Yes Has patient had a PCN reaction that required hospitalization Yes- ed visit Has patient had a PCN reaction occurring within the last 10 years: No-childhood allergy If all of the above answers are "NO", then may proceed with Cephalosporin use.  Throat Swelling   Shellfish Allergy Anaphylaxis   Shrimp Extract Allergy Skin Test Other (See Comments)   Sulfa Antibiotics Anaphylaxis, Swelling and Rash   Tea Anaphylaxis    All teas   Sulfamethoxazole Swelling, Rash and Other (See Comments)    Throat Swelling    The results of significant diagnostics from this hospitalization (including imaging, microbiology, ancillary and laboratory) are listed below for reference.    Microbiology: Recent Results (from the past 240 hour(s))  Resp Panel by RT-PCR (Flu A&B, Covid) Nasopharyngeal  Swab     Status: None   Collection Time: 06/22/21  5:05 AM   Specimen: Nasopharyngeal Swab; Nasopharyngeal(NP) swabs in vial transport medium  Result Value Ref Range Status   SARS Coronavirus 2 by RT PCR NEGATIVE NEGATIVE Final    Comment: (NOTE) SARS-CoV-2 target nucleic acids are NOT DETECTED.  The SARS-CoV-2 RNA is generally detectable in upper respiratory specimens during the acute phase of infection. The lowest concentration of SARS-CoV-2 viral copies this assay can detect is 138 copies/mL. A negative result does not preclude SARS-Cov-2 infection and should not be used as the sole  basis for treatment or other patient management decisions. A negative result may occur with  improper specimen collection/handling, submission of specimen other than nasopharyngeal swab, presence of viral mutation(s) within the areas targeted by this assay, and inadequate number of viral copies(<138 copies/mL). A negative result must be combined with clinical observations, patient history, and epidemiological information. The expected result is Negative.  Fact Sheet for Patients:  EntrepreneurPulse.com.au  Fact Sheet for Healthcare Providers:  IncredibleEmployment.be  This test is no t yet approved or cleared by the [place] FDA and  has been authorized for detection and/or diagnosis of SARS-CoV-2 by FDA under an Emergency Use Authorization (EUA). This EUA will remain  in effect (meaning this test can be used) for the duration of the COVID-19 declaration under Section 564(b)(1) of the Act, 21 U.S.C.section 360bbb-3(b)(1), unless the authorization is terminated  or revoked sooner.       Influenza A by PCR NEGATIVE NEGATIVE Final   Influenza B by PCR NEGATIVE NEGATIVE Final    Comment: (NOTE) The Xpert Xpress SARS-CoV-2/FLU/RSV plus assay is intended as an aid in the diagnosis of influenza from Nasopharyngeal swab specimens and should not be used as a sole  basis for treatment. Nasal washings and aspirates are unacceptable for Xpert Xpress SARS-CoV-2/FLU/RSV testing.  Fact Sheet for Patients: EntrepreneurPulse.com.au  Fact Sheet for Healthcare Providers: IncredibleEmployment.be  This test is not yet approved or cleared by the [place] FDA and has been authorized for detection and/or diagnosis of SARS-CoV-2 by FDA under an Emergency Use Authorization (EUA). This EUA will remain in effect (meaning this test can be used) for the duration of the COVID-19 declaration under Section 564(b)(1) of the Act, 21 U.S.C. section 360bbb-3(b)(1), unless the authorization is terminated or revoked.  Performed at [facility], [place] [address] [place], [place] [address]     Procedures/Studies: DG Chest Port 1 View  Result Date: 06/22/2021 CLINICAL DATA:  Chest pain EXAM: PORTABLE CHEST 1 VIEW COMPARISON:  05/23/2021 FINDINGS: The heart size and mediastinal contours are within normal limits. Both lungs are clear. The visualized skeletal structures are unremarkable. IMPRESSION: No active disease. Electronically Signed   By: Ulyses Jarred M.D.   On: 06/22/2021 03:05   ECHOCARDIOGRAM COMPLETE  Result Date: 06/22/2021    ECHOCARDIOGRAM REPORT   Patient Name:   Nicole [name] Date of Exam: 06/22/2021 Medical Rec #:  [id number]     Height:       65.0 in Accession #:    [id number]    Weight:       275.6 lb Date of Birth:  08/05/1976     BSA:          2.267 m Patient Age:    45 years      BP:           142/96 mmHg Patient Gender: F             HR:           51 bpm. Exam Location:  Inpatient Procedure: 2D Echo, Cardiac Doppler and Color Doppler Indications:    Pericardial effusion  History:        Patient has prior history of Echocardiogram examinations, most                 recent 02/11/2014. Stroke; Risk Factors:Former Smoker.  Sonographer:    Cammy Brochure Referring Phys: [id number] OLADAPO ADEFESO IMPRESSIONS   1. Left ventricular ejection fraction, by estimation, is 55 to 60%.  The left ventricle has normal function. The left ventricle has no regional wall motion abnormalities. Left ventricular diastolic parameters were normal.  2. Right ventricular systolic function is normal. The right ventricular size is normal. There is normal pulmonary artery systolic pressure. The estimated right ventricular systolic pressure is 26.9 mmHg.  3. The mitral valve is normal in structure. Trivial mitral valve regurgitation. No evidence of mitral stenosis.  4. The aortic valve is tricuspid. Aortic valve regurgitation is not visualized. No aortic stenosis is present.  5. The inferior vena cava is normal in size with greater than 50% respiratory variability, suggesting right atrial pressure of 3 mmHg. FINDINGS  Left Ventricle: Left ventricular ejection fraction, by estimation, is 55 to 60%. The left ventricle has normal function. The left ventricle has no regional wall motion abnormalities. The left ventricular internal cavity size was normal in size. There is  no left ventricular hypertrophy. Left ventricular diastolic parameters were normal. Right Ventricle: The right ventricular size is normal. No increase in right ventricular wall thickness. Right ventricular systolic function is normal. There is normal pulmonary artery systolic pressure. The tricuspid regurgitant velocity is 1.97 m/s, and  with an assumed right atrial pressure of 3 mmHg, the estimated right ventricular systolic pressure is 48.5 mmHg. Left Atrium: Left atrial size was normal in size. Right Atrium: Right atrial size was normal in size. Pericardium: There is no evidence of pericardial effusion. Mitral Valve: The mitral valve is normal in structure. Trivial mitral valve regurgitation. No evidence of mitral valve stenosis. Tricuspid Valve: The tricuspid valve is normal in structure. Tricuspid valve regurgitation is trivial. Aortic Valve: The aortic valve is tricuspid. Aortic  valve regurgitation is not visualized. No aortic stenosis is present. Aortic valve mean gradient measures 4.0 mmHg. Aortic valve peak gradient measures 8.0 mmHg. Aortic valve area, by VTI measures 2.36 cm. Pulmonic Valve: The pulmonic valve was normal in structure. Pulmonic valve regurgitation is trivial. Aorta: The aortic root is normal in size and structure. Venous: The inferior vena cava is normal in size with greater than 50% respiratory variability, suggesting right atrial pressure of 3 mmHg. IAS/Shunts: No atrial level shunt detected by color flow Doppler.  LEFT VENTRICLE PLAX 2D LVIDd:         4.20 cm  Diastology LVIDs:         2.80 cm  LV e' medial:    8.27 cm/s LV PW:         1.00 cm  LV E/e' medial:  11.2 LV IVS:        1.10 cm  LV e' lateral:   13.40 cm/s LVOT diam:     2.00 cm  LV E/e' lateral: 6.9 LV SV:         74 LV SV Index:   33 LVOT Area:     3.14 cm  RIGHT VENTRICLE             IVC RV Basal diam:  3.90 cm     IVC diam: 1.90 cm RV S prime:     13.50 cm/s LEFT ATRIUM             Index       RIGHT ATRIUM           Index LA diam:        3.80 cm 1.68 cm/m  RA Area:     15.60 cm LA Vol (A2C):   66.8 ml 29.47 ml/m RA Volume:   38.80 ml  17.12 ml/m LA  Vol (A4C):   61.9 ml 27.31 ml/m LA Biplane Vol: 71.1 ml 31.36 ml/m  AORTIC VALVE AV Area (Vmax):    2.43 cm AV Area (Vmean):   2.50 cm AV Area (VTI):     2.36 cm AV Vmax:           141.00 cm/s AV Vmean:          93.100 cm/s AV VTI:            0.314 m AV Peak Grad:      8.0 mmHg AV Mean Grad:      4.0 mmHg LVOT Vmax:         109.00 cm/s LVOT Vmean:        74.100 cm/s LVOT VTI:          0.236 m LVOT/AV VTI ratio: 0.75  AORTA Ao Root diam: 3.30 cm Ao Asc diam:  3.40 cm MITRAL VALVE               TRICUSPID VALVE MV Area (PHT): 3.85 cm    TR Peak grad:   15.5 mmHg MV Decel Time: 197 msec    TR Vmax:        197.00 cm/s MV E velocity: 92.40 cm/s MV A velocity: 66.10 cm/s  SHUNTS MV E/A ratio:  1.40        Systemic VTI:  0.24 m                             Systemic Diam: 2.00 cm Loralie Champagne MD Electronically signed by Loralie Champagne MD Signature Date/Time: 06/22/2021/3:42:25 PM    Final     Labs: BNP (last 3 results) Recent Labs    06/22/21 1017  BNP 25.0   Basic Metabolic Panel: Recent Labs  Lab 06/22/21 0245 06/23/21 0244  NA 139 141  K 3.6 3.9  CL 106 110  CO2 26 25  GLUCOSE 92 92  BUN 9 8  CREATININE 0.91 0.78  0.72  CALCIUM 9.0 8.7*   Liver Function Tests: Recent Labs  Lab 06/23/21 0244  AST 16  ALT 16  ALKPHOS 60  BILITOT 0.6  PROT 6.5  ALBUMIN 3.6   No results for input(s): LIPASE, AMYLASE in the last 168 hours. No results for input(s): AMMONIA in the last 168 hours. CBC: Recent Labs  Lab 06/22/21 0245 06/23/21 0244  WBC 7.4 5.2  NEUTROABS 3.3  --   HGB 11.0* 11.2*  HCT 34.6* 35.4*  MCV 87.8 87.0  PLT 290 260   Cardiac Enzymes: No results for input(s): CKTOTAL, CKMB, CKMBINDEX, TROPONINI in the last 168 hours. BNP: Invalid input(s): POCBNP CBG: No results for input(s): GLUCAP in the last 168 hours. D-Dimer Recent Labs    06/22/21 1017  DDIMER 0.39   Hgb A1c No results for input(s): HGBA1C in the last 72 hours. Lipid Profile No results for input(s): CHOL, HDL, LDLCALC, TRIG, CHOLHDL, LDLDIRECT in the last 72 hours. Thyroid function studies No results for input(s): TSH, T4TOTAL, T3FREE, THYROIDAB in the last 72 hours.  Invalid input(s): FREET3 Anemia work up No results for input(s): VITAMINB12, FOLATE, FERRITIN, TIBC, IRON, RETICCTPCT in the last 72 hours. Urinalysis    Component Value Date/Time   COLORURINE STRAW (A) 05/17/2019 1657   APPEARANCEUR CLEAR 05/17/2019 1657   LABSPEC 1.008 05/17/2019 1657   PHURINE 6.0 05/17/2019 1657   GLUCOSEU NEGATIVE 05/17/2019 1657   HGBUR NEGATIVE 05/17/2019 1657   BILIRUBINUR NEGATIVE  05/17/2019 1657   BILIRUBINUR negative 03/30/2016 [place] 05/17/2019 1657   PROTEINUR NEGATIVE 05/17/2019 1657   UROBILINOGEN 4.0 03/30/2016  1644   UROBILINOGEN 1.0 10/21/2015 0418   NITRITE NEGATIVE 05/17/2019 1657   LEUKOCYTESUR NEGATIVE 05/17/2019 1657   Sepsis Labs Invalid input(s): PROCALCITONIN,  WBC,  LACTICIDVEN Microbiology Recent Results (from the past 240 hour(s))  Resp Panel by RT-PCR (Flu A&B, Covid) Nasopharyngeal Swab     Status: None   Collection Time: 06/22/21  5:05 AM   Specimen: Nasopharyngeal Swab; Nasopharyngeal(NP) swabs in vial transport medium  Result Value Ref Range Status   SARS Coronavirus 2 by RT PCR NEGATIVE NEGATIVE Final    Comment: (NOTE) SARS-CoV-2 target nucleic acids are NOT DETECTED.  The SARS-CoV-2 RNA is generally detectable in upper respiratory specimens during the acute phase of infection. The lowest concentration of SARS-CoV-2 viral copies this assay can detect is 138 copies/mL. A negative result does not preclude SARS-Cov-2 infection and should not be used as the sole basis for treatment or other patient management decisions. A negative result may occur with  improper specimen collection/handling, submission of specimen other than nasopharyngeal swab, presence of viral mutation(s) within the areas targeted by this assay, and inadequate number of viral copies(<138 copies/mL). A negative result must be combined with clinical observations, patient history, and epidemiological information. The expected result is Negative.  Fact Sheet for Patients:  EntrepreneurPulse.com.au  Fact Sheet for Healthcare Providers:  IncredibleEmployment.be  This test is no t yet approved or cleared by the [place] FDA and  has been authorized for detection and/or diagnosis of SARS-CoV-2 by FDA under an Emergency Use Authorization (EUA). This EUA will remain  in effect (meaning this test can be used) for the duration of the COVID-19 declaration under Section 564(b)(1) of the Act, 21 U.S.C.section 360bbb-3(b)(1), unless the authorization is terminated  or  revoked sooner.       Influenza A by PCR NEGATIVE NEGATIVE Final   Influenza B by PCR NEGATIVE NEGATIVE Final    Comment: (NOTE) The Xpert Xpress SARS-CoV-2/FLU/RSV plus assay is intended as an aid in the diagnosis of influenza from Nasopharyngeal swab specimens and should not be used as a sole basis for treatment. Nasal washings and aspirates are unacceptable for Xpert Xpress SARS-CoV-2/FLU/RSV testing.  Fact Sheet for Patients: EntrepreneurPulse.com.au  Fact Sheet for Healthcare Providers: IncredibleEmployment.be  This test is not yet approved or cleared by the [place] FDA and has been authorized for detection and/or diagnosis of SARS-CoV-2 by FDA under an Emergency Use Authorization (EUA). This EUA will remain in effect (meaning this test can be used) for the duration of the COVID-19 declaration under Section 564(b)(1) of the Act, 21 U.S.C. section 360bbb-3(b)(1), unless the authorization is terminated or revoked.  Performed at [facility], [place] [address], [place], [place] [address]      Time coordinating discharge: 25 minutes  SIGNED: Antonieta Pert, MD  [facility] 06/23/2021, 8:28 AM  If 7PM-7AM, please contact night-coverage [email or web address]

## 2021-07-28 ENCOUNTER — Ambulatory Visit: Payer: BLUE CROSS/BLUE SHIELD | Admitting: Cardiology

## 2021-07-28 NOTE — Progress Notes (Deleted)
Cardiology Office Note   Date:  07/28/2021   ID:  Nicole [name], DOB [birth year], MRN [id number]  PCP:  System, Provider Not In  Cardiologist:  Dr. Debara Pickett     No chief complaint on file.  Post hospital    History of Present Illness: Nicole [name] is a 45 y.o. female who presents for ***   a hx of sickle cell trait, anemia, asthma, GERD, seizure disorder, obesity, OSA, migraines, fibroid, RA, possible prior pericarditis   She states mother had MI starting at age 78 and father had strokes.  Echo with EF 55-60%, no RWMA, RV normal. Trivial MR,   hs troponin <2   though early on elevated, felt to be spurious there were labs issues at that  time in hospital.  sed rate 10  EKG no changes.  Not felt to be cardiac.   Today ***  Past Medical History:  Diagnosis Date   Anemia    Asthma    Atypical chest pain    a. ?pericarditis 2015.   Blood transfusion 2011   r/t anemia   Fibroid    Migraine    Rheumatoid arthritis ([place])    Seizures (HCC)    Sickle cell trait (HCC)    Stroke ([place])    Tooth abscess     Past Surgical History:  Procedure Laterality Date   TUBAL LIGATION Bilateral 2005     Current Outpatient Medications  Medication Sig Dispense Refill   AIMOVIG 140 MG/ML SOAJ Inject 140 mg into the skin every 30 (thirty) days.     busPIRone (BUSPAR) 5 MG tablet Take 5 mg by mouth 2 (two) times daily.     cetirizine (ZYRTEC) 10 MG tablet Take 10 mg by mouth daily.     EPINEPHrine 0.3 mg/0.3 mL IJ SOAJ injection Inject 0.3 mg into the muscle as needed for anaphylaxis (for allergic reaction).     Fe Cbn-Fe Gluc-FA-B12-C-DSS (FERRALET 90) 90-1 MG TABS Take 1 tablet by mouth daily before breakfast. 30 each 11   fluticasone (FLONASE) 50 MCG/ACT nasal spray Place 2 sprays into both nostrils daily.     levETIRAcetam (KEPPRA) 1000 MG tablet Take 1 tablet (1,000 mg total) by mouth 2 (two) times daily for 10 days. (Patient not taking: Reported on 06/22/2021) 20 tablet 0   levETIRAcetam  (KEPPRA) 1000 MG tablet Take 1,000-2,000 mg by mouth See admin instructions. Take '1000mg'$  in the morning and '2000mg'$  at bedtime.     methocarbamol (ROBAXIN) 500 MG tablet Take 1 tablet (500 mg total) by mouth 2 (two) times daily. (Patient not taking: Reported on 06/22/2021) 20 tablet 0   metoprolol succinate (TOPROL-XL) 25 MG 24 hr tablet Take 25 mg by mouth daily.     montelukast (SINGULAIR) 10 MG tablet Take 1 tablet (10 mg total) by mouth at bedtime. 90 tablet 3   promethazine (PHENERGAN) 25 MG tablet Take 25 mg by mouth as needed for nausea or vomiting.     SUMAtriptan (IMITREX) 100 MG tablet Take 100 mg by mouth every 2 (two) hours as needed for migraine.     Vitamin D, Ergocalciferol, (DRISDOL) 1.25 MG (50000 UNIT) CAPS capsule Take 50,000 Units by mouth once a week. Sundays     No current facility-administered medications for this visit.    Allergies:   Green tea leaf ext, Penicillins, Shellfish allergy, Shrimp extract allergy skin test, Sulfa antibiotics, Tea, and Sulfamethoxazole    Social History:  The patient  reports that she quit smoking  about 7 years ago. She has never used smokeless tobacco. She reports that she does not drink alcohol and does not use drugs.   Family History:  The patient's ***family history includes Asthma in her brother, father, mother, paternal grandmother, and sister; Breast cancer in her maternal grandfather and maternal grandmother; Breast cancer (age of onset: 57) in her mother; Cancer in her mother; Clotting disorder in her father and mother; Diabetes in her father, maternal grandmother, and mother; Heart disease in her father and mother; Hypertension in her father and mother; Sickle cell anemia in her father; Stroke in her father and mother.    ROS:  General:no colds or fevers, no weight changes Skin:no rashes or ulcers HEENT:no blurred vision, no congestion CV:see HPI PUL:see HPI GI:no diarrhea constipation or melena, no indigestion GU:no hematuria, no  dysuria MS:no joint pain, no claudication Neuro:no syncope, no lightheadedness Endo:no diabetes, no thyroid disease Wt Readings from Last 3 Encounters:  06/21/21 275 lb 9.6 oz (125 kg)  05/23/21 265 lb (120.2 kg)  04/26/21 273 lb 3.2 oz (123.9 kg)     PHYSICAL EXAM: VS:  There were no vitals taken for this visit. , BMI There is no height or weight on file to calculate BMI. General:Pleasant affect, NAD Skin:Warm and dry, brisk capillary refill HEENT:normocephalic, sclera clear, mucus membranes moist Neck:supple, no JVD, no bruits  Heart:S1S2 RRR without murmur, gallup, rub or click Lungs:clear without rales, rhonchi, or wheezes [id number], non tender, + BS, do not palpate liver spleen or masses Ext:no lower ext edema, 2+ pedal pulses, 2+ radial pulses Neuro:alert and oriented, MAE, follows commands, + facial symmetry    EKG:  EKG is ordered today. The ekg ordered today demonstrates ***   Recent Labs: 06/22/2021: B Natriuretic Peptide 42.8 06/23/2021: ALT 16; BUN 8; Creatinine, Ser 0.72; Creatinine, Ser 0.78; Hemoglobin 11.2; Platelets 260; Potassium 3.9; Sodium 141    Lipid Panel    Component Value Date/Time   CHOL 170 01/20/2014 1553   TRIG 91 01/20/2014 1553   HDL 41 01/20/2014 1553   LDLCALC 111 (H) 01/20/2014 1553       Other studies Reviewed: Additional studies/ records that were reviewed today include: ***.   ASSESSMENT AND PLAN:  1.  ***   Current medicines are reviewed with the patient today.  The patient Has no concerns regarding medicines.  The following changes have been made:  See above Labs/ tests ordered today include:see above  Disposition:   FU:  see above  Signed, Cecilie Kicks, NP  07/28/2021 10:01 AM    [facility], [place], [address] [place], [place] Phone: [phone]; Fax: [phone]

## 2021-08-15 ENCOUNTER — Emergency Department (HOSPITAL_COMMUNITY)
Admission: EM | Admit: 2021-08-15 | Discharge: 2021-08-15 | Disposition: A | Discharge status: Home / Self Care | Payer: BLUE CROSS/BLUE SHIELD | Attending: Emergency Medicine | Admitting: Emergency Medicine

## 2021-08-15 ENCOUNTER — Encounter (HOSPITAL_COMMUNITY): Payer: Self-pay

## 2021-08-15 ENCOUNTER — Other Ambulatory Visit: Payer: Self-pay

## 2021-08-15 DIAGNOSIS — J45909 Unspecified asthma, uncomplicated: Secondary | ICD-10-CM | POA: Diagnosis not present

## 2021-08-15 DIAGNOSIS — R519 Headache, unspecified: Secondary | ICD-10-CM | POA: Diagnosis present

## 2021-08-15 DIAGNOSIS — G43809 Other migraine, not intractable, without status migrainosus: Secondary | ICD-10-CM

## 2021-08-15 DIAGNOSIS — Z87891 Personal history of nicotine dependence: Secondary | ICD-10-CM | POA: Insufficient documentation

## 2021-08-15 DIAGNOSIS — B349 Viral infection, unspecified: Secondary | ICD-10-CM | POA: Diagnosis not present

## 2021-08-15 DIAGNOSIS — Z20822 Contact with and (suspected) exposure to covid-19: Secondary | ICD-10-CM | POA: Diagnosis not present

## 2021-08-15 LAB — SARS CORONAVIRUS 2 (TAT 6-24 HRS): SARS Coronavirus 2: NEGATIVE

## 2021-08-15 MED ORDER — METOCLOPRAMIDE HCL 5 MG/ML IJ SOLN
10.0000 mg | Freq: Once | INTRAMUSCULAR | Status: AC
  Administered 2021-08-15: 10 mg via INTRAVENOUS
  Filled 2021-08-15: qty 2

## 2021-08-15 MED ORDER — DEXAMETHASONE SODIUM PHOSPHATE 10 MG/ML IJ SOLN
10.0000 mg | Freq: Once | INTRAMUSCULAR | Status: AC
  Administered 2021-08-15: 10 mg via INTRAVENOUS
  Filled 2021-08-15: qty 1

## 2021-08-15 MED ORDER — DIPHENHYDRAMINE HCL 50 MG/ML IJ SOLN
50.0000 mg | Freq: Once | INTRAMUSCULAR | Status: AC
  Administered 2021-08-15: 50 mg via INTRAVENOUS
  Filled 2021-08-15: qty 1

## 2021-08-15 NOTE — Discharge Instructions (Addendum)
If you test positive for COVID you will receive a phone call from the hospital.  Advised taking Tylenol and Motrin as needed for body aches and fever.  Use your albuterol inhaler as needed for shortness of breath, if you start to cough or have increased shortness of breath need to follow-up with your primary care doctor or return back to the ED for further evaluation. Continue your normal migraine medicine.

## 2021-08-15 NOTE — ED Provider Notes (Signed)
[facility] DEPT Provider Note   CSN: [id number] Arrival date & time: 08/15/21  0843     History Chief Complaint  Patient presents with   Generalized Body Aches   Headache    Nicole [name] is a 45 y.o. female.   Headache Associated symptoms: congestion, diarrhea, fever, myalgias, nausea and photophobia   Associated symptoms: no abdominal pain, no cough, no sore throat and no vomiting    Patient presents with viral symptoms and headache.  Patient has generalized body aches, congestion, headache, 2 episodes of diarrhea, fevers x3 days.  She is COVID vaccinated and boosted, no known sick exposures.  She has been trying Flonase for nasal congestion but that has not been helping.  Starting earlier today the patient developed a migraine, she has history of the same and takes preventative shots for it.  The migraine has been going on for multiple hours, associated with photophobia and photophobia.  There is some nausea but without any vomiting.  This is not different than any of her other headaches, not worse headache of life.  No vision changes, no head trauma.  Patient does have a history of asthma, has been using her rescue inhaler once daily.  She denies any shortness of breath or difficulty breathing, she has not been coughing.  Past Medical History:  Diagnosis Date   Anemia    Asthma    Atypical chest pain    a. ?pericarditis 2015.   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[place])    Depression    Generalized abdominal pain    Fatigue 10/28/2015   Adjustment disorder with depressed mood 10/21/2015   Dizziness    Weakness 11/18/2014   OSA (obstructive sleep apnea) 10/21/2014   Acute rhinosinusitis 10/09/2014   Leg  pain, lateral 09/04/2014   Migraines 08/13/2014   Seizure ([place]) 06/04/2014   Leiomyoma of uterus, unspecified 02/11/2014   GERD (gastroesophageal reflux disease) 01/28/2014   Candidiasis of vulva and vagina 01/28/2014   Unspecified symptom associated with female genital organs 01/28/2014   Chest pain 01/20/2014   Acute pericarditis, unspecified 01/20/2014    Past Surgical History:  Procedure Laterality Date   TUBAL LIGATION Bilateral 2005     OB History     Gravida  3   Para  2   Term  1   Preterm  1   AB  1   Living  2      SAB  0   IAB  0   Ectopic  1   Multiple  0   Live Births  2           Family History  Problem Relation Age of Onset   Hypertension Mother    Diabetes Mother    Cancer Mother        Colon, brain, 2 other   Asthma Mother    Clotting disorder Mother    Heart disease Mother        50, stents   Stroke Mother        2012   Breast cancer Mother 60   Diabetes Father    Asthma Father  Clotting disorder Father    Sickle cell anemia Father    Heart disease Father        2000, stents   Hypertension Father    Stroke Father        2007   Diabetes Maternal Grandmother    Breast cancer Maternal Grandmother    Asthma Sister    Asthma Brother    Asthma Paternal Grandmother    Breast cancer Maternal Grandfather    Anesthesia problems Neg Hx     Social History   Tobacco Use   Smoking status: Former    Types: Cigarettes    Quit date: 03/27/2014    Years since quitting: 7.3   Smokeless tobacco: Never  Vaping Use   Vaping Use: Never used  Substance Use Topics   Alcohol use: No   Drug use: No    Home Medications Prior to Admission medications   Medication Sig Start Date End Date Taking? Authorizing Provider  AIMOVIG 140 MG/ML SOAJ Inject 140 mg into the skin every 30 (thirty) days. 05/25/21   [provider]  busPIRone (BUSPAR) 5 MG tablet Take 5 mg by mouth 2 (two) times daily. 02/17/21   [provider]   cetirizine (ZYRTEC) 10 MG tablet Take 10 mg by mouth daily.    [provider]  EPINEPHrine 0.3 mg/0.3 mL IJ SOAJ injection Inject 0.3 mg into the muscle as needed for anaphylaxis (for allergic reaction).    [provider]  Fe Cbn-Fe Gluc-FA-B12-C-DSS (FERRALET 90) 90-1 MG TABS Take 1 tablet by mouth daily before breakfast. 01/08/19   Shelly Bombard, MD  fluticasone [facility]) 50 MCG/ACT nasal spray Place 2 sprays into both nostrils daily. 12/21/20   [provider]  levETIRAcetam (KEPPRA) 1000 MG tablet Take 1 tablet (1,000 mg total) by mouth 2 (two) times daily for 10 days. Patient not taking: Reported on 06/22/2021 12/15/19 06/22/21  Ok Edwards, PA-C  levETIRAcetam (KEPPRA) 1000 MG tablet Take 1,000-2,000 mg by mouth See admin instructions. Take '1000mg'$  in the morning and '2000mg'$  at bedtime. 04/08/21   [provider]  methocarbamol (ROBAXIN) 500 MG tablet Take 1 tablet (500 mg total) by mouth 2 (two) times daily. Patient not taking: Reported on 06/22/2021 05/23/21   Lacretia Leigh, MD  metoprolol succinate (TOPROL-XL) 25 MG 24 hr tablet Take 25 mg by mouth daily. 05/23/21   [provider]  montelukast (SINGULAIR) 10 MG tablet Take 1 tablet (10 mg total) by mouth at bedtime. 07/03/19   Cirigliano, Garvin Fila, DO  promethazine (PHENERGAN) 25 MG tablet Take 25 mg by mouth as needed for nausea or vomiting. 04/08/21   [provider]  SUMAtriptan (IMITREX) 100 MG tablet Take 100 mg by mouth every 2 (two) hours as needed for migraine. 04/08/21   [provider]  Vitamin D, Ergocalciferol, (DRISDOL) 1.25 MG (50000 UNIT) CAPS capsule Take 50,000 Units by mouth once a week. Sundays 06/07/21   [provider]  ALAYCEN 1/35 tablet TAKE 1 TABLET BY MOUTH EVERY DAY Patient not taking: No sig reported 08/07/19 06/23/21  Shelly Bombard, MD  famotidine (PEPCID) 20 MG tablet Take 1 tablet (20 mg total) by mouth daily. Patient not taking: No sig reported  11/13/19 06/23/21  Ok Edwards, PA-C  ferrous sulfate 325 (65 FE) MG tablet Take 1 tablet (325 mg total) by mouth daily. Patient not taking: No sig reported 12/29/18 06/23/21  Petrucelli, Samantha R, PA-C  ipratropium (ATROVENT) 0.06 % nasal spray Place 2  sprays into both nostrils 4 (four) times daily. Patient not taking: No sig reported 11/13/19 06/23/21  Ok Edwards, PA-C  medroxyPROGESTERone (DEPO-PROVERA) 150 MG/ML injection INJECT 1 ML (150 MG TOTAL) INTO THE MUSCLE EVERY 3 (THREE) MONTHS. Patient not taking: No sig reported 03/03/20 06/23/21  Shelly Bombard, MD  topiramate (TOPAMAX) 25 MG tablet Take 3 tablets (75 mg total) by mouth 2 (two) times daily. Patient not taking: No sig reported 07/07/18 06/23/21  Molpus, Jenny Reichmann, MD    Allergies    Green tea leaf ext, Penicillins, Shellfish allergy, Shrimp extract allergy skin test, Sulfa antibiotics, Tea, and Sulfamethoxazole  Review of Systems   Review of Systems  Constitutional:  Positive for fever.  HENT:  Positive for congestion. Negative for sore throat.   Eyes:  Positive for photophobia. Negative for visual disturbance.  Respiratory:  Negative for cough and shortness of breath.   Cardiovascular:  Negative for chest pain.  Gastrointestinal:  Positive for diarrhea and nausea. Negative for abdominal pain and vomiting.  Musculoskeletal:  Positive for myalgias.  Neurological:  Positive for headaches.   Physical Exam Updated Vital Signs BP (!) 152/96 (BP Location: Right Arm)   Pulse 66   Temp 98 F (36.7 C) (Oral)   Resp 18   Ht '5\' 5"'$  (1.651 m)   Wt 125 kg   SpO2 98%   BMI 45.86 kg/m   Physical Exam Vitals and nursing note reviewed. Exam conducted with a chaperone present.  Constitutional:      General: She is not in acute distress.    Appearance: Normal appearance. She is obese.  HENT:     Head: Normocephalic and atraumatic.     Nose: Congestion present.     Comments: Nasal congestion bilaterally, injected turbinates bilaterally     Mouth/Throat:     Mouth: Mucous membranes are moist.     Pharynx: Oropharynx is clear.  Eyes:     General: No scleral icterus.    Extraocular Movements: Extraocular movements intact.     Pupils: Pupils are equal, round, and reactive to light.     Comments: No nystagmus  Cardiovascular:     Rate and Rhythm: Normal rate and regular rhythm.     Heart sounds: Normal heart sounds.  Pulmonary:     Effort: Pulmonary effort is normal. No respiratory distress.     Breath sounds: Normal breath sounds.     Comments: Lungs are clear to auscultation bilaterally, no wheezes on expiration or inspiration.  No accessory muscle use or signs respiratory distress. Skin:    Coloration: Skin is not jaundiced.  Neurological:     Mental Status: She is alert. Mental status is at baseline.     Coordination: Coordination normal.     Comments: Cranial nerves III through XII grossly intact, grip strength is equal bilaterally.  No dysarthria, patient follows commands and answers questions appropriately.    ED Results / Procedures / Treatments   Labs (all labs ordered are listed, but only abnormal results are displayed) Labs Reviewed  SARS CORONAVIRUS 2 (TAT 6-24 HRS)    EKG None  Radiology No results found.  Procedures Procedures   Medications Ordered in ED Medications  metoCLOPramide (REGLAN) injection 10 mg (has no administration in time range)  dexamethasone (DECADRON) injection 10 mg (has no administration in time range)  diphenhydrAMINE (BENADRYL) injection 50 mg (has no administration in time range)    ED Course  I have reviewed the triage vital signs and the nursing notes.  Pertinent labs & imaging results that were available during my care of the patient were reviewed by me and considered in my medical decision making (see chart for details).  Clinical Course as of 08/15/21 1415  Mon Aug 15, 2021  1040 Patient reports resolution of her migraine. [HS]    Clinical Course User  Index [HS] Sherrill Raring, PA-C   MDM Rules/Calculators/A&P                           Patient vital signs stable, she is satting at 98% without any tachypnea or signs of respiratory distress.  Her lungs are clear to auscultation bilaterally without any wheezes, do not think chest x-ray is warranted at this time.  She does have a history of asthma so I will give her a shot of Decadron at this time to both help with her migraine as well as to help with potential asthma exacerbation although that is not happening right now.  I suspect most of her symptoms are likely due to viral URI plus migraine.  Will test for COVID and flu here in the ED and will treat migraine symptomatically.  Do not think labs or imaging is warranted given this is a classic presentation of her typical migraine.  Patient reports resolution of her migraine.  We informed her that COVID test will result in 24 hours, will give work note preemptively and treat this is a viral URI symptomatically.  She is given a shot of Decadron here to cover for potential asthma exacerbation, although she is not currently having an exacerbation.  We will have her follow-up with her primary care doctor in the next week if no improvement, will treat viral symptoms symptomatically.  Advised to continue taking Flonase.  She is breathing evenly, no tachycardia or tachypnea.  She is satting 100% on room air, no wheezes.  She is appropriate and stable for discharge home.  Final Clinical Impression(s) / ED Diagnoses Final diagnoses:  None    Rx / DC Orders ED Discharge Orders     None        Sherrill Raring, PA-C [id number]    Lianne Cure, DO [id number]

## 2021-08-15 NOTE — ED Triage Notes (Signed)
Body aches, weakness, headache, congestion, runny nose x3 days. Excedrin and nasal spray used with no relief.

## 2021-11-03 IMAGING — DX DG TOE GREAT 2+V*L*
3 series · 3 of 3 positions shown · non-contrast
Comparison: None.

CLINICAL DATA: Pt states a big heavy box fell on her 1st left digit
of foot today. Pt limped to exam room. Pt is able to slightly wiggle
toe due to pain.

EXAM:
LEFT GREAT TOE

[toe ap]
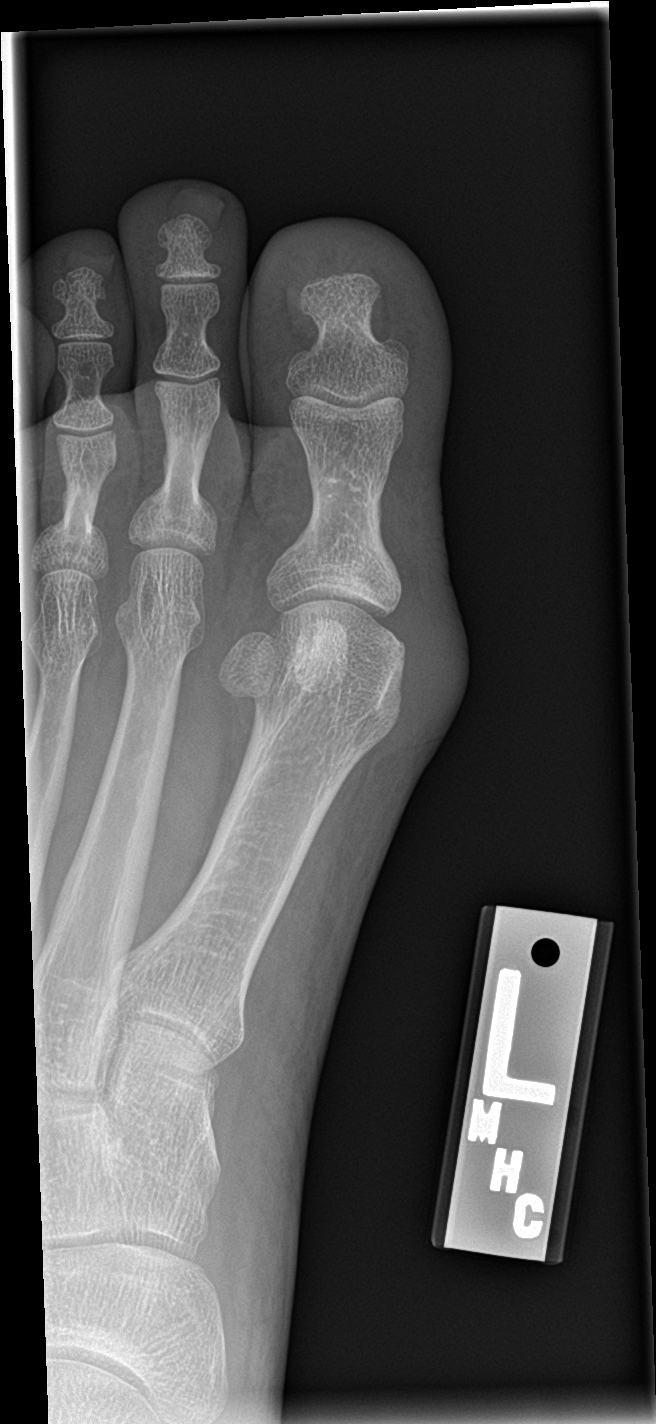

[toe obl]
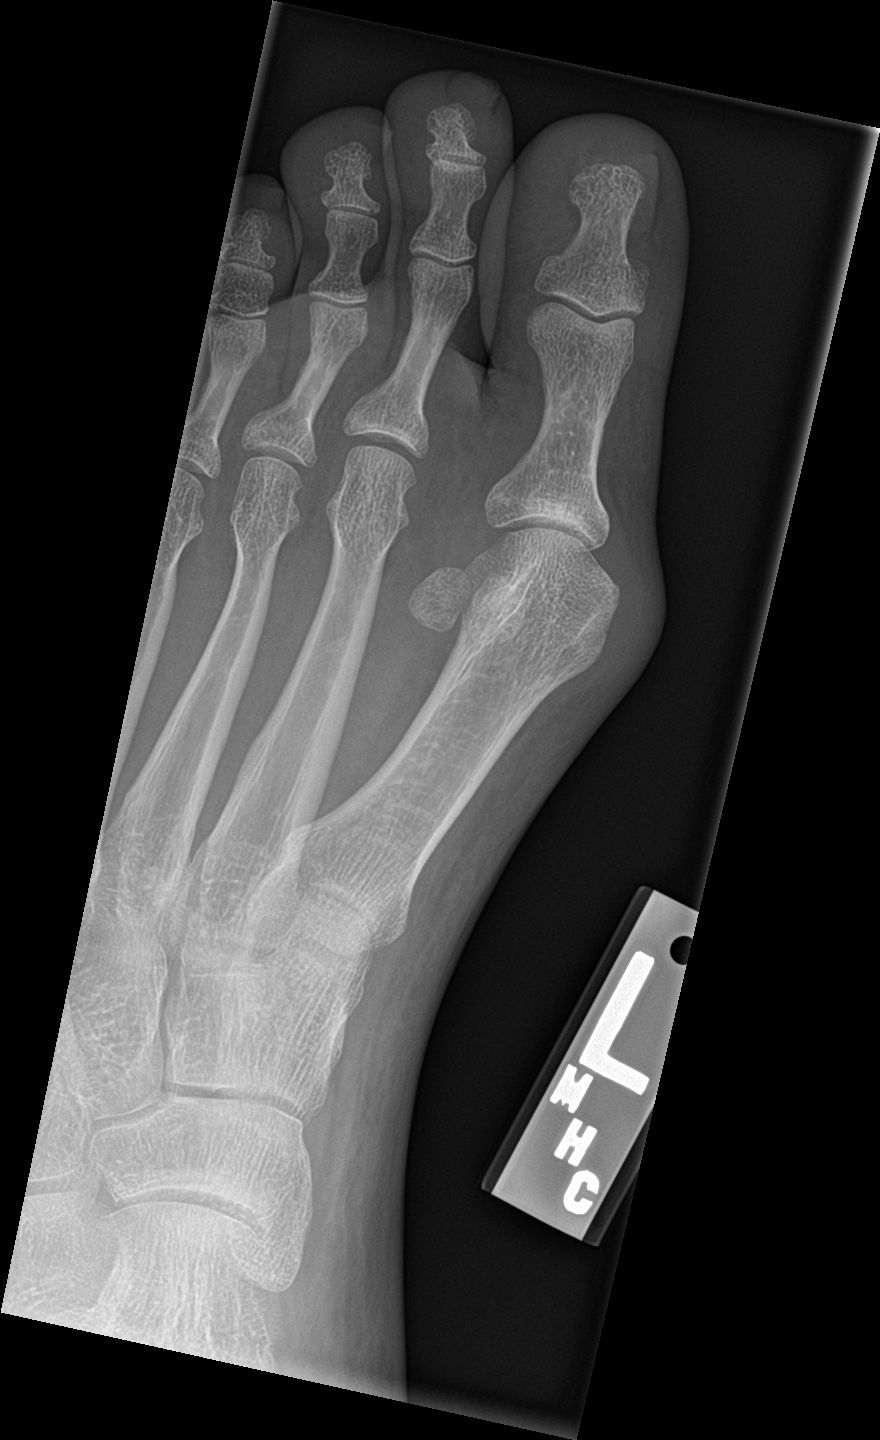

[toe lat]
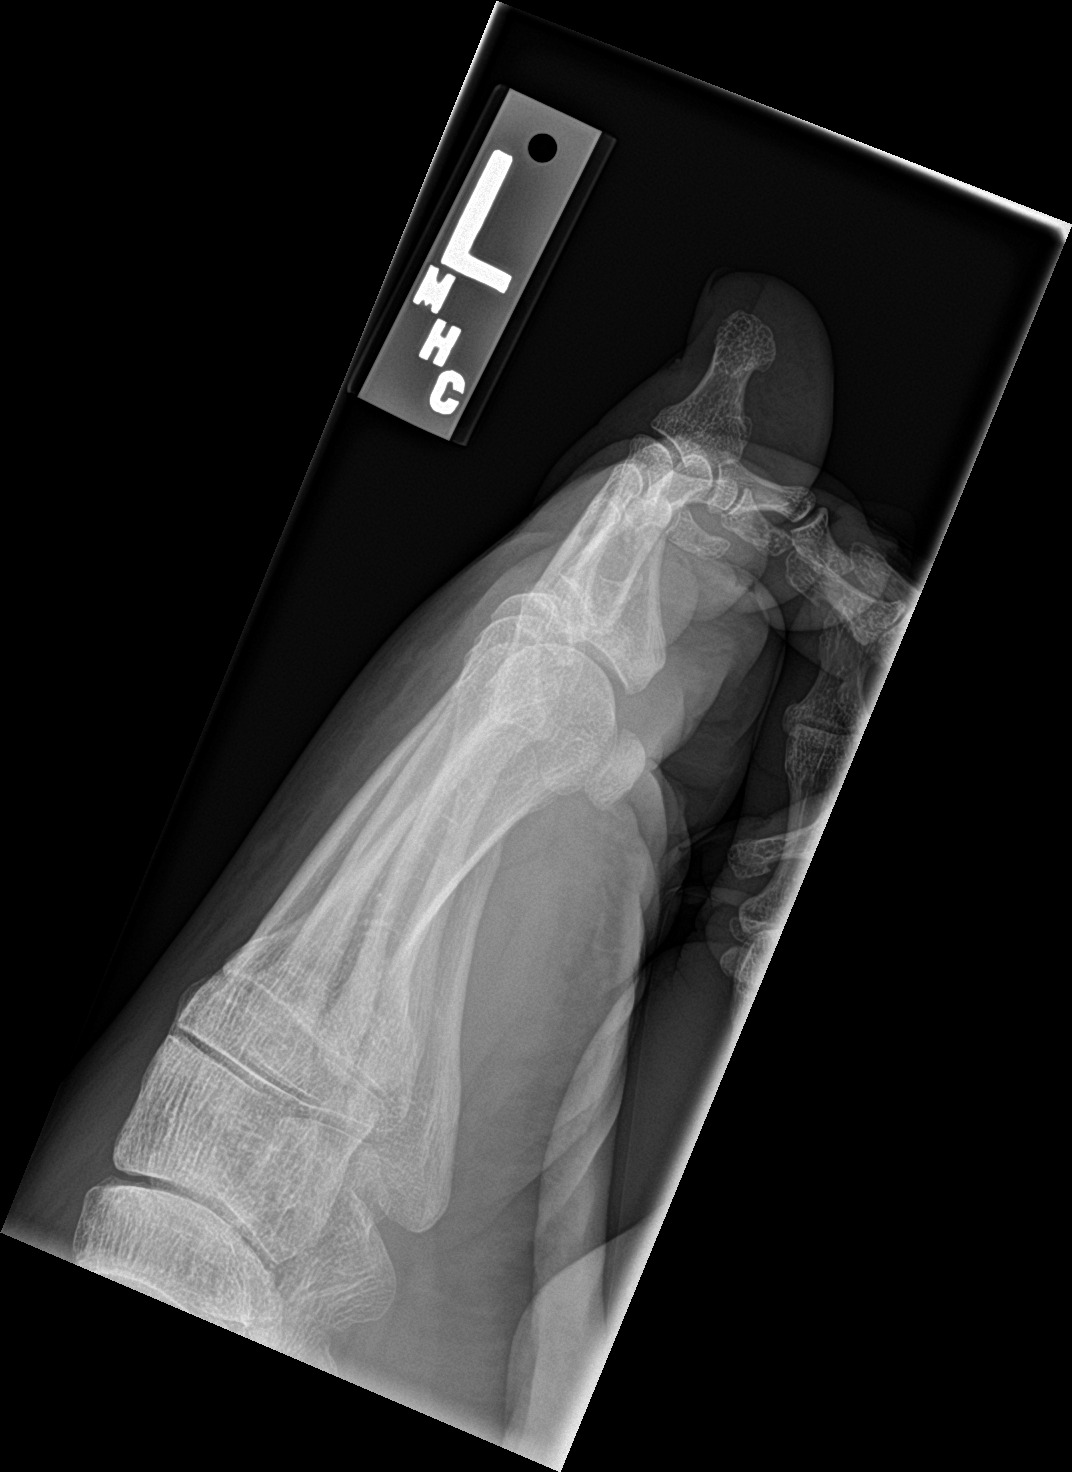

[3 of 3 positions shown; findings below may reference images not displayed]

FINDINGS: There is no evidence of fracture or dislocation. There is no
evidence of arthropathy or other focal bone abnormality. Soft
tissues are unremarkable.
IMPRESSION: Negative.

## 2021-11-04 ENCOUNTER — Emergency Department (HOSPITAL_COMMUNITY)
Admission: EM | Admit: 2021-11-04 | Discharge: 2021-11-04 | Disposition: A | Discharge status: Home / Self Care | Payer: BLUE CROSS/BLUE SHIELD | Attending: Emergency Medicine | Admitting: Emergency Medicine

## 2021-11-04 ENCOUNTER — Emergency Department (HOSPITAL_COMMUNITY): Payer: BLUE CROSS/BLUE SHIELD

## 2021-11-04 ENCOUNTER — Encounter (HOSPITAL_COMMUNITY): Payer: Self-pay | Admitting: Emergency Medicine

## 2021-11-04 DIAGNOSIS — Z87891 Personal history of nicotine dependence: Secondary | ICD-10-CM | POA: Diagnosis not present

## 2021-11-04 DIAGNOSIS — N9489 Other specified conditions associated with female genital organs and menstrual cycle: Secondary | ICD-10-CM | POA: Diagnosis not present

## 2021-11-04 DIAGNOSIS — J45909 Unspecified asthma, uncomplicated: Secondary | ICD-10-CM | POA: Diagnosis not present

## 2021-11-04 DIAGNOSIS — G43809 Other migraine, not intractable, without status migrainosus: Secondary | ICD-10-CM | POA: Diagnosis not present

## 2021-11-04 DIAGNOSIS — Z7952 Long term (current) use of systemic steroids: Secondary | ICD-10-CM | POA: Diagnosis not present

## 2021-11-04 DIAGNOSIS — R519 Headache, unspecified: Secondary | ICD-10-CM | POA: Diagnosis present

## 2021-11-04 LAB — CBC WITH DIFFERENTIAL/PLATELET
Abs Immature Granulocytes: 0.01 10*3/uL (ref 0.00–0.07)
Basophils Absolute: 0.1 10*3/uL (ref 0.0–0.1)
Basophils Relative: 2 %
Eosinophils Absolute: 0.6 10*3/uL — ABNORMAL HIGH (ref 0.0–0.5)
Eosinophils Relative: 11 %
HCT: 34.7 % — ABNORMAL LOW (ref 36.0–46.0)
Hemoglobin: 11.8 g/dL — ABNORMAL LOW (ref 12.0–15.0)
Immature Granulocytes: 0 %
Lymphocytes Relative: 43 %
Lymphs Abs: 2.4 10*3/uL (ref 0.7–4.0)
MCH: 30.2 pg (ref 26.0–34.0)
MCHC: 34 g/dL (ref 30.0–36.0)
MCV: 88.7 fL (ref 80.0–100.0)
Monocytes Absolute: 0.5 10*3/uL (ref 0.1–1.0)
Monocytes Relative: 9 %
Neutro Abs: 1.9 10*3/uL (ref 1.7–7.7)
Neutrophils Relative %: 35 %
Platelets: 262 10*3/uL (ref 150–400)
RBC: 3.91 MIL/uL (ref 3.87–5.11)
RDW: 12.7 % (ref 11.5–15.5)
WBC: 5.5 10*3/uL (ref 4.0–10.5)
nRBC: 0 % (ref 0.0–0.2)

## 2021-11-04 LAB — I-STAT BETA HCG BLOOD, ED (MC, WL, AP ONLY): I-stat hCG, quantitative: <5 m[IU]/mL (ref <5)

## 2021-11-04 LAB — BASIC METABOLIC PANEL
Anion gap: 8 (ref 5–15)
BUN: 8 mg/dL (ref 6–20)
CO2: 22 mmol/L (ref 22–32)
Calcium: 8.8 mg/dL — ABNORMAL LOW (ref 8.9–10.3)
Chloride: 106 mmol/L (ref 98–111)
Creatinine, Ser: 0.69 mg/dL (ref 0.44–1.00)
GFR, Estimated: >60 mL/min (ref >60)
Glucose, Bld: 120 mg/dL — ABNORMAL HIGH (ref 70–99)
Potassium: 3.6 mmol/L (ref 3.5–5.1)
Sodium: 136 mmol/L (ref 135–145)

## 2021-11-04 MED ORDER — METOCLOPRAMIDE HCL 5 MG/ML IJ SOLN
10.0000 mg | Freq: Once | INTRAMUSCULAR | Status: AC
  Administered 2021-11-04: 10 mg via INTRAVENOUS
  Filled 2021-11-04: qty 2

## 2021-11-04 MED ORDER — MAGNESIUM SULFATE 2 GM/50ML IV SOLN
2.0000 g | Freq: Once | INTRAVENOUS | Status: AC
  Administered 2021-11-04: 2 g via INTRAVENOUS
  Filled 2021-11-04: qty 50

## 2021-11-04 MED ORDER — DEXAMETHASONE SODIUM PHOSPHATE 4 MG/ML IJ SOLN
4.0000 mg | Freq: Once | INTRAMUSCULAR | Status: AC
  Administered 2021-11-04: 4 mg via INTRAVENOUS
  Filled 2021-11-04: qty 1

## 2021-11-04 MED ORDER — SODIUM CHLORIDE 0.9 % IV BOLUS
1000.0000 mL | Freq: Once | INTRAVENOUS | Status: AC
  Administered 2021-11-04: 1000 mL via INTRAVENOUS

## 2021-11-04 MED ORDER — DIPHENHYDRAMINE HCL 25 MG PO CAPS
25.0000 mg | ORAL_CAPSULE | Freq: Once | ORAL | Status: AC
  Administered 2021-11-04: 25 mg via ORAL
  Filled 2021-11-04: qty 1

## 2021-11-04 NOTE — Discharge Instructions (Signed)
Please schedule an appointment with your neurologist as soon as you are able to and discuss the visual symptoms that you are having today.  Make sure to avoid triggers such as dehydration, caffeine withdraw.  Refrain from any alcohol or recreational drug use.

## 2021-11-04 NOTE — ED Triage Notes (Signed)
Per pt, states she is having a migraine-states she called her neurologist and they told her to come to ED-states they gave her a muscle relaxer but it has not helped

## 2021-11-04 NOTE — ED Provider Notes (Signed)
Emergency Medicine Provider Triage Evaluation Note  Nicole [name] , a 45 y.o. female  was evaluated in triage.  Pt complains of headache.  Ongoing x2 weeks.  Has history of migraine.  [place] neuro.  Was given a shot at their office few days ago which did not help.  She admits to feeling off when she ambulates as well as bilateral blurred vision.  No diplopia.  She feels generally weak however no unilateral weakness.  No slurred speech.  She thinks she might of had a seizure in her sleep.  Has history of seizure disorder.  Takes Keppra.  Has not missed any doses.  No recent medication changes to her seizure or migraine medications. No hx of MS, optic neuritis   Review of Systems  Positive: Headache, blurred vision, weakness Negative: Numbness, difficulty speaking, Fever  Physical Exam  BP (!) 134/92 (BP Location: Left Arm)   Pulse 89   Temp 98.5 F (36.9 C) (Oral)   Resp 16   SpO2 100%  Gen:   Awake, no distress   Resp:  Normal effort  MSK:   Moves extremities without difficulty  Neuro:  CN 2-12 grossly intact, equal strength however global weakness throughout Ambulatory without ataxic gait Other:    Medical Decision Making  Medically screening exam initiated at 12:52 PM.  Appropriate orders placed.  [name] was informed that the remainder of the evaluation will be completed by another provider, this initial triage assessment does not replace that evaluation, and the importance of remaining in the ED until their evaluation is complete.  HA, blurred vision   Michaelah Credeur A, PA-C 11/04/21 1254    Wyvonnia Dusky, MD 11/04/21 1304

## 2021-11-04 NOTE — ED Provider Notes (Signed)
[facility] DEPT Provider Note   CSN: [id number] Arrival date & time: 11/04/21  1202     History Chief Complaint  Patient presents with   Migraine    Nicole [name] is a 45 y.o. female.  HPI Patient is a 45 year old female with past medical history significant for seizures, stroke, migraines, anemia requiring transfusions, asthma atypical chest pain  Patient is presented to the emergency room today with complaints of R headache intermittent 2 weeks and constant since 6 days - waxing and waning. 10/10 pain. Sees neurology for migraines imitrex and tylenol without relief.   She states that her symptoms have not improved with the treatment she has from neurology and they prescribed her an muscle relaxer over the phone and told to come to the emergency room should her symptoms worsen.  She denies any numbness or weakness slurred speech or confusion.  States that she feels more fatigued states that she woke up this morning very fatigued and was worried that she may have had a seizure in her sleep however denies any bowel or bladder incontinence or tongue biting  She takes Keppra for seizures and has not had any missed doses per patient.      Past Medical History:  Diagnosis Date   Anemia    Asthma    Atypical chest pain    a. ?pericarditis 2015.   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[place])    Depression    Generalized abdominal pain    Fatigue 10/28/2015   Adjustment disorder with depressed mood 10/21/2015   Dizziness    Weakness 11/18/2014   OSA (obstructive sleep apnea) 10/21/2014   Acute rhinosinusitis 10/09/2014   Leg pain, lateral 09/04/2014   Migraines 08/13/2014   Seizure ([place]) 06/04/2014   Leiomyoma of  uterus, unspecified 02/11/2014   GERD (gastroesophageal reflux disease) 01/28/2014   Candidiasis of vulva and vagina 01/28/2014   Unspecified symptom associated with female genital organs 01/28/2014   Chest pain 01/20/2014   Acute pericarditis, unspecified 01/20/2014    Past Surgical History:  Procedure Laterality Date   TUBAL LIGATION Bilateral 2005     OB History     Gravida  3   Para  2   Term  1   Preterm  1   AB  1   Living  2      SAB  0   IAB  0   Ectopic  1   Multiple  0   Live Births  2           Family History  Problem Relation Age of Onset   Hypertension Mother    Diabetes Mother    Cancer Mother        Colon, brain, 2 other   Asthma Mother    Clotting disorder Mother    Heart disease Mother        80, stents   Stroke Mother        2012   Breast cancer Mother 47   Diabetes Father    Asthma Father    Clotting disorder Father    Sickle cell anemia Father    Heart  disease Father        2000, stents   Hypertension Father    Stroke Father        2007   Diabetes Maternal Grandmother    Breast cancer Maternal Grandmother    Asthma Sister    Asthma Brother    Asthma Paternal Grandmother    Breast cancer Maternal Grandfather    Anesthesia problems Neg Hx     Social History   Tobacco Use   Smoking status: Former    Types: Cigarettes    Quit date: 03/27/2014    Years since quitting: 7.6   Smokeless tobacco: Never  Vaping Use   Vaping Use: Never used  Substance Use Topics   Alcohol use: No   Drug use: No    Home Medications Prior to Admission medications   Medication Sig Start Date End Date Taking? Authorizing Provider  AIMOVIG 140 MG/ML SOAJ Inject 140 mg into the skin every 30 (thirty) days. 05/25/21   [provider]  busPIRone (BUSPAR) 5 MG tablet Take 5 mg by mouth 2 (two) times daily. 02/17/21   [provider]  cetirizine (ZYRTEC) 10 MG tablet Take 10 mg by mouth daily.    [provider]   EPINEPHrine 0.3 mg/0.3 mL IJ SOAJ injection Inject 0.3 mg into the muscle as needed for anaphylaxis (for allergic reaction).    [provider]  Fe Cbn-Fe Gluc-FA-B12-C-DSS (FERRALET 90) 90-1 MG TABS Take 1 tablet by mouth daily before breakfast. 01/08/19   Shelly Bombard, MD  fluticasone [facility]) 50 MCG/ACT nasal spray Place 2 sprays into both nostrils daily. 12/21/20   [provider]  levETIRAcetam (KEPPRA) 1000 MG tablet Take 1 tablet (1,000 mg total) by mouth 2 (two) times daily for 10 days. Patient not taking: Reported on 06/22/2021 12/15/19 06/22/21  Ok Edwards, PA-C  levETIRAcetam (KEPPRA) 1000 MG tablet Take 1,000-2,000 mg by mouth See admin instructions. Take 1000mg  in the morning and 2000mg  at bedtime. 04/08/21   [provider]  methocarbamol (ROBAXIN) 500 MG tablet Take 1 tablet (500 mg total) by mouth 2 (two) times daily. Patient not taking: Reported on 06/22/2021 05/23/21   Lacretia Leigh, MD  metoprolol succinate (TOPROL-XL) 25 MG 24 hr tablet Take 25 mg by mouth daily. 05/23/21   [provider]  montelukast (SINGULAIR) 10 MG tablet Take 1 tablet (10 mg total) by mouth at bedtime. 07/03/19   Cirigliano, Garvin Fila, DO  promethazine (PHENERGAN) 25 MG tablet Take 25 mg by mouth as needed for nausea or vomiting. 04/08/21   [provider]  SUMAtriptan (IMITREX) 100 MG tablet Take 100 mg by mouth every 2 (two) hours as needed for migraine. 04/08/21   [provider]  Vitamin D, Ergocalciferol, (DRISDOL) 1.25 MG (50000 UNIT) CAPS capsule Take 50,000 Units by mouth once a week. Sundays 06/07/21   [provider]  ALAYCEN 1/35 tablet TAKE 1 TABLET BY MOUTH EVERY DAY Patient not taking: No sig reported 08/07/19 06/23/21  Shelly Bombard, MD  famotidine (PEPCID) 20 MG tablet Take 1 tablet (20 mg total) by mouth daily. Patient not taking: No sig reported 11/13/19 06/23/21  Ok Edwards, PA-C  ferrous sulfate 325 (65 FE) MG tablet Take 1 tablet (325  mg total) by mouth daily. Patient not taking: No sig reported 12/29/18 06/23/21  Petrucelli, Samantha R, PA-C  ipratropium (ATROVENT) 0.06 % nasal spray Place 2 sprays into both nostrils 4 (four) times daily. Patient not taking: No sig reported  11/13/19 06/23/21  Ok Edwards, PA-C  medroxyPROGESTERone (DEPO-PROVERA) 150 MG/ML injection INJECT 1 ML (150 MG TOTAL) INTO THE MUSCLE EVERY 3 (THREE) MONTHS. Patient not taking: No sig reported 03/03/20 06/23/21  Shelly Bombard, MD  topiramate (TOPAMAX) 25 MG tablet Take 3 tablets (75 mg total) by mouth 2 (two) times daily. Patient not taking: No sig reported 07/07/18 06/23/21  Molpus, Jenny Reichmann, MD    Allergies    Green tea leaf ext, Penicillins, Shellfish allergy, Shrimp extract allergy skin test, Sulfa antibiotics, Tea, and Sulfamethoxazole  Review of Systems   Review of Systems  Constitutional:  Positive for fatigue. Negative for chills and fever.  HENT:  Negative for congestion.   Eyes:  Negative for pain.  Respiratory:  Negative for cough and shortness of breath.   Cardiovascular:  Negative for chest pain and leg swelling.  Gastrointestinal:  Negative for abdominal pain and vomiting.  Genitourinary:  Negative for dysuria.  Musculoskeletal:  Negative for myalgias.  Skin:  Negative for rash.  Neurological:  Positive for headaches. Negative for dizziness.   Physical Exam Updated Vital Signs BP (!) 141/98 (BP Location: Left Arm)   Pulse 78   Temp 98.5 F (36.9 C) (Oral)   Resp 16   SpO2 97%   Physical Exam Vitals and nursing note reviewed.  Constitutional:      General: She is in acute distress.     Comments: Patient is very uncomfortable appearing squirming in discomfort.  HENT:     Head: Normocephalic and atraumatic.     Nose: Nose normal.  Eyes:     General: No scleral icterus. Cardiovascular:     Rate and Rhythm: Normal rate and regular rhythm.     Pulses: Normal pulses.     Heart sounds: Normal heart sounds.  Pulmonary:      Effort: Pulmonary effort is normal. No respiratory distress.     Breath sounds: No wheezing.  Abdominal:     Palpations: Abdomen is soft.     Tenderness: There is no abdominal tenderness.  Musculoskeletal:     Cervical back: Normal range of motion.     Right lower leg: No edema.     Left lower leg: No edema.  Skin:    General: Skin is warm and dry.     Capillary Refill: Capillary refill takes less than 2 seconds.  Neurological:     Mental Status: She is alert. Mental status is at baseline.     Comments: Alert and oriented to self, place, time and event.   Speech is fluent, clear without dysarthria or dysphasia.   Strength 5/5 in upper/lower extremities   Sensation intact in upper/lower extremities   Normal gait.  Negative Romberg. No pronator drift.  Normal finger-to-nose and feet tapping.  CN I not tested  CN II grossly intact visual fields bilaterally. Did not visualize posterior eye.  CN III, IV, VI PERRLA and EOMs intact bilaterally  Does seem to be having some vertical diplopia in each when isolated.   CN V Intact sensation to sharp and light touch to the face  CN VII facial movements symmetric  CN VIII not tested  CN IX, X no uvula deviation, symmetric rise of soft palate  CN XI 5/5 SCM and trapezius strength bilaterally  CN XII Midline tongue protrusion, symmetric L/R movements   Psychiatric:        Mood and Affect: Mood normal.        Behavior: Behavior normal.    ED  Results / Procedures / Treatments   Labs (all labs ordered are listed, but only abnormal results are displayed) Labs Reviewed  CBC WITH DIFFERENTIAL/PLATELET - Abnormal; Notable for the following components:      Result Value   Hemoglobin 11.8 (*)    HCT 34.7 (*)    Eosinophils Absolute 0.6 (*)    All other components within normal limits  BASIC METABOLIC PANEL - Abnormal; Notable for the following components:   Glucose, Bld 120 (*)    Calcium 8.8 (*)    All other components within normal  limits  I-STAT BETA HCG BLOOD, ED ([facility], [facility], [facility] ONLY)    EKG None  Radiology CT HEAD WO CONTRAST (5MM)  Result Date: 11/04/2021 CLINICAL DATA:  Headache. EXAM: CT HEAD WITHOUT CONTRAST TECHNIQUE: Contiguous axial images were obtained from the base of the skull through the vertex without intravenous contrast. COMPARISON:  May 17, 2019. FINDINGS: Brain: No evidence of acute infarction, hemorrhage, hydrocephalus, extra-axial collection or mass lesion/mass effect. Vascular: No hyperdense vessel or unexpected calcification. Skull: Normal. Negative for fracture or focal lesion. Sinuses/Orbits: No acute finding. Other: None. IMPRESSION: No acute intracranial abnormality seen. Electronically Signed   By: Marijo Conception M.D.   On: 11/04/2021 14:25    Procedures Procedures   Medications Ordered in ED Medications  metoCLOPramide (REGLAN) injection 10 mg (10 mg Intravenous Given 11/04/21 1837)  sodium chloride 0.9 % bolus 1,000 mL (0 mLs Intravenous Stopped 11/04/21 2008)  diphenhydrAMINE (BENADRYL) capsule 25 mg (25 mg Oral Given 11/04/21 1836)  magnesium sulfate IVPB 2 g 50 mL (0 g Intravenous Stopped 11/04/21 2008)  dexamethasone (DECADRON) injection 4 mg (4 mg Intravenous Given 11/04/21 1837)    ED Course  I have reviewed the triage vital signs and the nursing notes.  Pertinent labs & imaging results that were available during my care of the patient were reviewed by me and considered in my medical decision making (see chart for details).  Clinical Course as of 11/04/21 2110  Fri Nov 04, 2021  1815 R headache intermittent 2 weeks and constant since 6 days constant waxing and waning. 10/10 pain. Sees neurology for migraines imitrex and tylenol without relief.  [WF]    Clinical Course User Index [WF] Tedd Sias, PA   MDM Rules/Calculators/A&P                          I initially evaluated patient at 1615 she is extremely uncomfortable appearing writhing around in pain I am able to  do a neurologic exam which was normal apart from some abnormal vision symptoms seem to be having diplopia however it seems to be present when I is isolated AKA she is having diplopia with left eye covered at diplopia of right eye covered.  However symptoms resolved when both eyes are uncovered.  She states that she does not normally have visual symptoms with her migraines.  Head CT obtained in triage was without acute abnormality.  Labs unremarkable   I personally reviewed all laboratory work and imaging.  Metabolic panel without any acute abnormality specifically kidney function within normal limits and no significant electrolyte abnormalities. CBC without leukocytosis or significant anemia.   Provided patient with magnesium, normal saline, Decadron, Reglan, Motrin.  Reassessed patient she has no residual headache currently states she feels well is currently talking the phone of her appointment.  Denies any diplopia currently.  Neurologic exam completely normal at this time with no visual  abnormalities.  Patient has a neurologist that she follows with who has been the original diagnosis of migraine headache.  She will follow-up with her neurologist.  Return cautions given.  Final Clinical Impression(s) / ED Diagnoses Final diagnoses:  Other migraine without status migrainosus, not intractable    Rx / DC Orders ED Discharge Orders     None        Tedd Sias, [place] 11/04/21 2112    Isla Pence, MD 11/04/21 2231

## 2021-11-17 ENCOUNTER — Telehealth: Payer: Self-pay | Admitting: Obstetrics

## 2021-11-17 NOTE — Telephone Encounter (Signed)
Patient left voicemail that she is having increased bleeding, changing pads every 2-3 hours.   Attempted to reach patient back, no answer, no voicemail set up.   Patient will need to schedule an appointment to be seen.

## 2021-12-14 ENCOUNTER — Encounter: Payer: Self-pay | Admitting: Obstetrics and Gynecology

## 2021-12-14 ENCOUNTER — Other Ambulatory Visit: Payer: Self-pay

## 2021-12-14 ENCOUNTER — Ambulatory Visit (INDEPENDENT_AMBULATORY_CARE_PROVIDER_SITE_OTHER): Payer: BLUE CROSS/BLUE SHIELD | Admitting: Obstetrics and Gynecology

## 2021-12-14 VITALS — BP 139/95 | HR 80 | Wt 269.0 lb

## 2021-12-14 DIAGNOSIS — D259 Leiomyoma of uterus, unspecified: Secondary | ICD-10-CM

## 2021-12-14 MED ORDER — OXYCODONE-ACETAMINOPHEN 5-325 MG PO TABS: 1.0000 | ORAL_TABLET | ORAL | 0 refills | Status: DC | PRN

## 2021-12-14 MED ORDER — MEGESTROL ACETATE 40 MG PO TABS: 40.0000 mg | ORAL_TABLET | Freq: Two times a day (BID) | ORAL | 5 refills | Status: DC

## 2021-12-14 NOTE — Patient Instructions (Signed)
Vaginal Hysterectomy, Care After The following information offers guidance on how to care for yourself after your procedure. Your health care provider may also give you more specific instructions. If you have problems or questions, contact your health care provider. What can I expect after the procedure? After the procedure, it is common to have: Pain in the lower abdomen and vagina. Vaginal bleeding and discharge for up to 1 week. You will need to use a sanitary pad after this procedure. Difficulty having a bowel movement (constipation). Temporary problems emptying the bladder. Tiredness (fatigue). Poor appetite. Less interest in sex. Feelings of sadness or other emotions. If your ovaries were also removed, it is also common to have symptoms of menopause, such as hot flashes, night sweats, and lack of sleep (insomnia). Follow these instructions at home: Medicines  Take over-the-counter and prescription medicines only as told by your health care provider. Do not take aspirin or NSAIDs, such as ibuprofen. These medicines can cause bleeding. Ask your health care provider if the medicine prescribed to you: Requires you to avoid driving or using machinery. Can cause constipation. You may need to take these actions to prevent or treat constipation: Drink enough fluid to keep your urine pale yellow. Take over-the-counter or prescription medicines. Eat foods that are high in fiber, such as beans, whole grains, and fresh fruits and vegetables. Limit foods that are high in fat and processed sugars, such as fried or sweet foods. Activity  Rest as told by your health care provider. Return to your normal activities as told by your health care provider. Ask your health care provider what activities are safe for you Avoid sitting for a long time without moving. Get up to take short walks every 1-2 hours. This is important to improve blood flow and breathing. Ask for help if you feel weak or  unsteady. Try to have someone home with you for 1-2 weeks to help you with everyday chores. Do not lift anything that is heavier than 10 lb (4.5 kg), or the limit that you are told, until your health care provider says that it is safe. If you were given a sedative during the procedure, it can affect you for several hours. Do not drive or operate machinery until your health care provider says that it is safe. Lifestyle Do not use any products that contain nicotine or tobacco. These products include cigarettes, chewing tobacco, and vaping devices, such as e-cigarettes. These can delay healing after surgery. If you need help quitting, ask your health care provider. Do not drink alcohol until your health care provider approves. General instructions Do not douche, use tampons, or have sex for at least 6 weeks, or as told by your health care provider. If you struggle with physical or emotional changes after your procedure, speak with your health care provider or a therapist. The stitches inside your vagina will dissolve over time and do not need to be taken out. Do not take baths, swim, or use a hot tub until your health care provider approves. You may only be allowed to take showers for 2-3 weeks Wear compression stockings as told by your health care provider. These stockings help to prevent blood clots and reduce swelling in your legs. Keep all follow-up visits. This is important. Contact a health care provider if: Your pain medicine is not helping. You have a fever. You have nausea or vomiting that does not go away. You feel dizzy. You have blood, pus, or a bad-smelling discharge from your vagina  more than 1 week after the procedure. You continue to have trouble urinating 3-5 days after the procedure. Get help right away if: You have severe pain in your abdomen or back. You faint. You have heavy vaginal bleeding and blood clots, soaking through a sanitary pad in less than 1 hour. You have chest  pain or shortness of breath. You have pain, swelling, or redness in your leg. These symptoms may represent a serious problem that is an emergency. Do not wait to see if the symptoms will go away. Get medical help right away. Call your local emergency services (911 in the [place] Do not drive yourself to the hospital. Summary After the procedure, it is common to have pain, vaginal bleeding, constipation, temporary problems emptying your bladder, and feelings of sadness or other emotions. Take over-the-counter and prescription medicines only as told by your health care provider. Rest as told by your health care provider. Return to your normal activities as told by your health care provider. Contact a health care provider if your pain medicine is not helping, or you have a fever, dizziness, or trouble urinating several days after the procedure. Get help right away if you have severe pain in your abdomen or back, or if you faint, have heavy bleeding, or have chest pain or shortness of breath. This information is not intended to replace advice given to you by your health care provider. Make sure you discuss any questions you have with your health care provider. Document Revised: 07/30/2020 Document Reviewed: 07/30/2020 Elsevier Patient Education  2022 [place]. Vaginal Hysterectomy A vaginal hysterectomy is a procedure to remove all or part of the uterus through a small incision in the vagina. In this procedure, your health care provider may remove your entire uterus, including the cervix. The cervix is the opening and bottom part of the uterus and is located between the vagina and the uterus. Sometimes, the ovaries and fallopian tubes are also removed. This surgery may be done to treat problems such as: Noncancerous growths in the uterus (uterine fibroids) that cause symptoms. A condition that causes the lining of the uterus to grow in other areas (endometriosis). Problems with pelvic  support. Cancer of the cervix, ovaries, uterus, or tissue that lines the uterus (endometrium). Excessive bleeding in the uterus. When removing your uterus, your health care provider may also remove the ovaries and the fallopian tubes. After this procedure, you will no longer be able to have a baby, and you will no longer have a menstrual period. Tell a health care provider about: Any allergies you have. All medicines you are taking, including vitamins, herbs, eye drops, creams, and over-the-counter medicines. Any problems you or family members have had with anesthetic medicines. Any blood disorders you have. Any surgeries you have had. Any medical conditions you have. Whether you are pregnant or may be pregnant. What are the risks? Generally, this is a safe procedure. However, problems may occur, including: Bleeding. Infection. Blood clots in the legs or lungs. Damage to nearby structures or organs. Pain during sex. Allergic reactions to medicines. What happens before the procedure? Staying hydrated Follow instructions from your health care provider about hydration, which may include: Up to 2 hours before the procedure - you may continue to drink clear liquids, such as water, clear fruit juice, black coffee, and plain tea.  Eating and drinking restrictions Follow instructions from your health care provider about eating and drinking, which may include: 8 hours before the procedure - stop eating heavy  meals or foods, such as meat, fried foods, or fatty foods. 6 hours before the procedure - stop eating light meals or foods, such as toast or cereal. 6 hours before the procedure - stop drinking milk or drinks that contain milk. 2 hours before the procedure - stop drinking clear liquids. Medicines Ask your health care provider about: Changing or stopping your regular medicines. This is especially important if you are taking diabetes medicines or blood thinners. Taking medicines such as  aspirin and ibuprofen. These medicines can thin your blood. Do not take these medicines unless your health care provider tells you to take them. Taking over-the-counter medicines, vitamins, herbs, and supplements. You may be asked to take a medicine to empty your colon (bowel preparation). General instructions If you were asked to do a bowel preparation before the procedure, follow instructions from your health care provider. This procedure can affect the way you feel about yourself. Talk with your health care provider about the physical and emotional changes hysterectomy may cause. Do not use any products that contain nicotine or tobacco for at least 4 weeks before the procedure. These products include cigarettes, e-cigarettes, and chewing tobacco. If you need help quitting, ask your health care provider. Plan to have a responsible adult take you home from the hospital or clinic. Plan to have a responsible adult care for you for the time you are told after you leave the hospital or clinic. This is important. Surgery safety Ask your health care provider: How your surgery site will be marked. What steps will be taken to help prevent infection. These may include: Removing hair at the surgery site. Washing skin with a germ-killing soap. Receiving antibiotic medicine. What happens during the procedure? An IV will be inserted into one of your veins. You will be given one or more of the following: A medicine to help you relax (sedative). A medicine to numb the area (local anesthetic). A medicine to make you fall asleep (general anesthetic). A medicine that is injected into your spine to numb the area below and slightly above the injection site (spinal anesthetic). A medicine that is injected into an area of your body to numb everything below the injection site (regional anesthetic). Your surgeon will make an incision in your vagina. Your surgeon will locate and remove all or part of your uterus.  Part or all of the uterus will be removed through the vagina. Your ovaries and fallopian tubes may be removed at the same time. The incision in your vagina will be closed with stitches (sutures) that dissolve over time. The procedure may vary among health care providers and hospitals. What happens after the procedure? Your blood pressure, heart rate, breathing rate, and blood oxygen level will be monitored until you leave the hospital or clinic. You will be encouraged to walk as soon as possible. You will also use a device or do breathing exercises to keep your lungs clear. You may have to wear compression stockings. These stockings help to prevent blood clots and reduce swelling in your legs. You will be given pain medicine as needed. You will need to wear a sanitary pad for vaginal discharge or bleeding. Summary A vaginal hysterectomy is a procedure to remove all or part of the uterus through the vagina. You may need a vaginal hysterectomy to treat a variety of abnormalities of the uterus. Plan to have a responsible adult take you home from the hospital or clinic. Plan to have a responsible adult care for you for  the time you are told after you leave the hospital or clinic. This is important. This information is not intended to replace advice given to you by your health care provider. Make sure you discuss any questions you have with your health care provider. Document Revised: 07/30/2020 Document Reviewed: 07/30/2020 Elsevier Patient Education  [place].

## 2021-12-14 NOTE — Progress Notes (Signed)
Nicole [name] presents to discuss uterine fibroids and [facility] She reports monthly cycles. LMP currently However bleeds for 3 weeks at a time. Cycles are painful.  H/O Fe anemia in the past and takes iron daily. Cycles are interfering with her ADL's.  H/O uterine fibroids by U/S in the past H/O BTL TSVD x 2 ( largest 10 # 8 oz) Pap smear UTD  PE AF VSS Lungs clear Heart RRR Abd soft + BS GU deferred at pt request  A/P [facility]        H/O Uterine fibroids  Will start Megace. Percocet for pain. Will check GYN U/S. Pt is interested in define treatment. Information on [facility] provided to pt. F/U in 3-4 weeks for U/S results and response to Megace

## 2021-12-14 NOTE — Progress Notes (Signed)
Pt states cycles are heavy and painful.  Pt states she has had to have Iron transfusion due to bleeding. Pt states that cycles usually last up to a month.   Pt states 800mg  Ibuprofen has not been helping with pain.  Pt has also had elevated BP since change in bleeding.   Last pap 2020 Last mammo 2022

## 2021-12-28 ENCOUNTER — Emergency Department (HOSPITAL_COMMUNITY): Payer: Commercial Managed Care - HMO

## 2021-12-28 ENCOUNTER — Other Ambulatory Visit: Payer: Self-pay

## 2021-12-28 ENCOUNTER — Emergency Department (HOSPITAL_COMMUNITY)
Admission: EM | Admit: 2021-12-28 | Discharge: 2021-12-29 | Disposition: A | Discharge status: Home / Self Care | Payer: Commercial Managed Care - HMO | Attending: Emergency Medicine | Admitting: Emergency Medicine

## 2021-12-28 ENCOUNTER — Telehealth: Payer: Managed Care, Other (non HMO) | Admitting: Family

## 2021-12-28 ENCOUNTER — Encounter (HOSPITAL_COMMUNITY): Payer: Self-pay

## 2021-12-28 DIAGNOSIS — M25561 Pain in right knee: Secondary | ICD-10-CM | POA: Diagnosis not present

## 2021-12-28 DIAGNOSIS — X58XXXA Exposure to other specified factors, initial encounter: Secondary | ICD-10-CM | POA: Insufficient documentation

## 2021-12-28 DIAGNOSIS — M545 Low back pain, unspecified: Secondary | ICD-10-CM

## 2021-12-28 MED ORDER — NAPROXEN 500 MG PO TABS: 500.0000 mg | ORAL_TABLET | Freq: Two times a day (BID) | ORAL | 0 refills | Status: DC

## 2021-12-28 MED ORDER — BACLOFEN 10 MG PO TABS: 10.0000 mg | ORAL_TABLET | Freq: Three times a day (TID) | ORAL | 0 refills | Status: DC

## 2021-12-28 NOTE — Progress Notes (Signed)

## 2021-12-28 NOTE — Addendum Note (Signed)
Addended by: Brunetta Jeans on: 12/28/2021 04:26 PM   Modules accepted: Orders

## 2021-12-28 NOTE — ED Provider Notes (Signed)
[facility] DEPT Provider Note   CSN: [id number] Arrival date & time: 12/28/21  2148     History  Chief Complaint  Patient presents with   Right Knee Pain    Nicole [name] is a 46 y.o. female.  The history is provided by the patient and medical records.   46 year old female presenting to the ED with right knee pain.  States for the past 3 days her knee has been popping and clicking with any movement or ambulation.  States she has intermittently had trouble with this knee, did require fluid to be drained out many years ago but no recent injury/trauma/fall.  Has been taking tylenol/motrin at home without relief.    Home Medications Prior to Admission medications   Medication Sig Start Date End Date Taking? Authorizing Provider  AIMOVIG 140 MG/ML SOAJ Inject 140 mg into the skin every 30 (thirty) days. 05/25/21   [provider]  baclofen (LIORESAL) 10 MG tablet Take 1 tablet (10 mg total) by mouth 3 (three) times daily. 12/28/21   Sharion Balloon, FNP  busPIRone (BUSPAR) 5 MG tablet Take 5 mg by mouth 2 (two) times daily. 02/17/21   [provider]  cetirizine (ZYRTEC) 10 MG tablet Take 10 mg by mouth daily.    [provider]  EPINEPHrine 0.3 mg/0.3 mL IJ SOAJ injection Inject 0.3 mg into the muscle as needed for anaphylaxis (for allergic reaction).    [provider]  Fe Cbn-Fe Gluc-FA-B12-C-DSS (FERRALET 90) 90-1 MG TABS Take 1 tablet by mouth daily before breakfast. 01/08/19   Shelly Bombard, MD  fluticasone [facility]) 50 MCG/ACT nasal spray Place 2 sprays into both nostrils daily. 12/21/20   [provider]  levETIRAcetam (KEPPRA) 1000 MG tablet Take 1,000-2,000 mg by mouth See admin instructions. Take 1000mg  in the morning and 2000mg  at bedtime. 04/08/21   [provider]  megestrol (MEGACE) 40 MG tablet Take 1 tablet (40 mg total) by mouth 2 (two) times daily. Can increase to two tablets twice a day  in the event of heavy bleeding 12/14/21   [name], MD  methocarbamol (ROBAXIN) 500 MG tablet Take 1 tablet (500 mg total) by mouth 2 (two) times daily. 05/23/21   Lacretia Leigh, MD  metoprolol succinate (TOPROL-XL) 25 MG 24 hr tablet Take 25 mg by mouth daily. 05/23/21   [provider]  montelukast (SINGULAIR) 10 MG tablet Take 1 tablet (10 mg total) by mouth at bedtime. 07/03/19   Cirigliano, Garvin Fila, DO  oxyCODONE-acetaminophen (PERCOCET/ROXICET) 5-325 MG tablet Take 1 tablet by mouth every 4 (four) hours as needed for severe pain. 12/14/21   [name], MD  promethazine (PHENERGAN) 25 MG tablet Take 25 mg by mouth as needed for nausea or vomiting. 04/08/21   [provider]  SUMAtriptan (IMITREX) 100 MG tablet Take 100 mg by mouth every 2 (two) hours as needed for migraine. 04/08/21   [provider]  Vitamin D, Ergocalciferol, (DRISDOL) 1.25 MG (50000 UNIT) CAPS capsule Take 50,000 Units by mouth once a week. Sundays 06/07/21   [provider]  ALAYCEN 1/35 tablet TAKE 1 TABLET BY MOUTH EVERY DAY Patient not taking: No sig reported 08/07/19 06/23/21  Shelly Bombard, MD  famotidine (PEPCID) 20 MG tablet Take 1 tablet (20 mg total) by mouth daily. Patient not taking: No sig reported 11/13/19 06/23/21  Ok Edwards, PA-C  ferrous sulfate 325 (65 FE) MG tablet Take 1 tablet (325 mg  total) by mouth daily. Patient not taking: No sig reported 12/29/18 06/23/21  Petrucelli, Samantha R, PA-C  ipratropium (ATROVENT) 0.06 % nasal spray Place 2 sprays into both nostrils 4 (four) times daily. Patient not taking: No sig reported 11/13/19 06/23/21  Ok Edwards, PA-C  medroxyPROGESTERone (DEPO-PROVERA) 150 MG/ML injection INJECT 1 ML (150 MG TOTAL) INTO THE MUSCLE EVERY 3 (THREE) MONTHS. Patient not taking: No sig reported 03/03/20 06/23/21  Shelly Bombard, MD  topiramate (TOPAMAX) 25 MG tablet Take 3 tablets (75 mg total) by mouth 2 (two) times daily. Patient not taking: No  sig reported 07/07/18 06/23/21  Molpus, Jenny Reichmann, MD      Allergies    Green tea leaf ext, Penicillins, Shellfish allergy, Shrimp extract allergy skin test, Sulfa antibiotics, Tea, and Sulfamethoxazole    Review of Systems   Review of Systems  Musculoskeletal:  Positive for arthralgias.  All other systems reviewed and are negative.  Physical Exam Updated Vital Signs BP 120/86 (BP Location: Left Arm)    Pulse 68    Temp 98.1 F (36.7 C) (Oral)    Resp 16    Ht 5\' 5"  (1.651 m)    Wt 113.4 kg    LMP 12/14/2021 (Approximate)    SpO2 96%    BMI 41.60 kg/m   Physical Exam Vitals and nursing note reviewed.  Constitutional:      Appearance: She is well-developed.  HENT:     Head: Normocephalic and atraumatic.  Eyes:     Conjunctiva/sclera: Conjunctivae normal.     Pupils: Pupils are equal, round, and reactive to light.  Cardiovascular:     Rate and Rhythm: Normal rate and regular rhythm.     Heart sounds: Normal heart sounds.  Pulmonary:     Effort: Pulmonary effort is normal.     Breath sounds: Normal breath sounds.  Abdominal:     General: Bowel sounds are normal.     Palpations: Abdomen is soft.  Musculoskeletal:        General: Normal range of motion.     Cervical back: Normal range of motion.     Comments: Right knee normal in appearance, some tenderness along infrapatellar region, there is no swelling or effusion present, able to flex/extend, intact distal pulses  Skin:    General: Skin is warm and dry.  Neurological:     Mental Status: She is alert and oriented to person, place, and time.    ED Results / Procedures / Treatments   Labs (all labs ordered are listed, but only abnormal results are displayed) Labs Reviewed - No data to display  EKG None  Radiology DG Knee Complete 4 Views Right  Result Date: 12/28/2021 CLINICAL DATA:  Pain, knee swelling. EXAM: RIGHT KNEE - COMPLETE 4+ VIEW COMPARISON:  Right knee x-ray 12/16/2018. FINDINGS: No evidence of fracture,  dislocation, or joint effusion. Mild patellofemoral compartment osteophyte formation is unchanged. Joint spaces are otherwise within normal limits. Soft tissues are unremarkable. IMPRESSION: 1. No acute bony abnormality. 2. 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 Electronically Signed   By: Ronney Asters M.D.   On: 12/28/2021 22:29    Procedures Procedures    Medications Ordered in ED Medications - No data to display  ED Course/ Medical Decision Making/ A&P                           Medical Decision Making Amount and/or Complexity of Data Reviewed  Radiology: ordered.  46 year old female presenting to the ED with right knee pain.  She reports popping and clicking with weightbearing and ambulation.  She denies any falls or trauma.  Knee is normal in appearance on exam, there is no bony deformity or effusion present.  Some tenderness along infrapatellar region.  Her leg is neurovascularly intact.  X-ray was ordered and reviewed, no acute findings, does have some degenerative changes in the patellofemoral compartment.  This correlates to her area of pain.  Will place in knee sleeve and have her follow-up with orthopedics.  Continue tylenol/motrin, RICE routine.  Can return here for new concerns.  Final Clinical Impression(s) / ED Diagnoses Final diagnoses:  Acute pain of right knee    Rx / DC Orders ED Discharge Orders     None         Larene Pickett, PA-C 12/28/21 2341    Fatima Blank, MD 12/29/21 [phone]

## 2021-12-28 NOTE — Discharge Instructions (Signed)
Can continue tylenol or motrin as needed--- up to 800mg  motrin 3x daily, up to 2000mg  tylenol daily. Wear knee sleeve, can ice and elevate knee at home as well. Follow-up with Dr. Percell Miller-- call his office for appt. Return here for new concerns.

## 2021-12-28 NOTE — ED Provider Triage Note (Signed)
Emergency Medicine Provider Triage Evaluation Note  Nicole [name] , a 46 y.o. female  was evaluated in triage.  Pt complains of right knee pain.  Pain has been present for about 4 days and she is also been feeling some popping when she gets up and walks.  She reports that the knee is swollen and it feels like there is fluid on it but she has not noticed any redness or warmth over the knee, no fevers or chills.  She is able to flex and extend the knee with some discomfort.  Reports they have had to draw fluid off her knee previously but she is not sure what it was for.  Review of Systems  Positive: Knee pain Negative: Fever  Physical Exam  BP 120/86 (BP Location: Left Arm)    Pulse 68    Temp 98.1 F (36.7 C) (Oral)    Resp 16    SpO2 96%  Gen:   Awake, no distress   Resp:  Normal effort  MSK:   Moves extremities without difficulty, right knee with tenderness across anterior joint line, there is mild palpable effusion, no overlying erythema or warmth Other:    Medical Decision Making  Medically screening exam initiated at 10:13 PM.  Appropriate orders placed.  Nicole [name] was informed that the remainder of the evaluation will be completed by another provider, this initial triage assessment does not replace that evaluation, and the importance of remaining in the ED until their evaluation is complete.     Nicole [name], [place] 12/28/21 2214

## 2021-12-28 NOTE — ED Triage Notes (Signed)
Pt c/o right knee pain x4 days. Pt states she has had to have fluid drained from the right knee before and that helps with the pain. Pt denies any recent falls/trauma/injury to right knee.

## 2021-12-29 ENCOUNTER — Ambulatory Visit (HOSPITAL_COMMUNITY): Admission: RE | Admit: 2021-12-29 | Payer: Managed Care, Other (non HMO) | Source: Ambulatory Visit

## 2022-01-11 ENCOUNTER — Ambulatory Visit: Payer: Self-pay | Admitting: Obstetrics and Gynecology

## 2022-01-19 ENCOUNTER — Ambulatory Visit (HOSPITAL_COMMUNITY): Admission: RE | Admit: 2022-01-19 | Payer: Managed Care, Other (non HMO) | Source: Ambulatory Visit

## 2022-01-23 ENCOUNTER — Ambulatory Visit: Payer: Self-pay | Admitting: Obstetrics and Gynecology

## 2022-01-23 ENCOUNTER — Telehealth: Payer: Self-pay

## 2022-01-23 NOTE — Telephone Encounter (Signed)
Unable to leave [facility], mailbox is not set up.  Mychart message was sent to Pt.

## 2022-02-20 ENCOUNTER — Encounter (HOSPITAL_BASED_OUTPATIENT_CLINIC_OR_DEPARTMENT_OTHER): Payer: Self-pay | Admitting: Obstetrics and Gynecology

## 2022-02-20 ENCOUNTER — Emergency Department (HOSPITAL_BASED_OUTPATIENT_CLINIC_OR_DEPARTMENT_OTHER): Payer: Commercial Managed Care - HMO | Admitting: Radiology

## 2022-02-20 ENCOUNTER — Other Ambulatory Visit: Payer: Self-pay

## 2022-02-20 ENCOUNTER — Emergency Department (HOSPITAL_BASED_OUTPATIENT_CLINIC_OR_DEPARTMENT_OTHER)
Admission: EM | Admit: 2022-02-20 | Discharge: 2022-02-20 | Discharge status: Against Medical Advice | Payer: Commercial Managed Care - HMO | Attending: Emergency Medicine | Admitting: Emergency Medicine

## 2022-02-20 DIAGNOSIS — R000 Tachycardia, unspecified: Secondary | ICD-10-CM | POA: Diagnosis not present

## 2022-02-20 DIAGNOSIS — R079 Chest pain, unspecified: Secondary | ICD-10-CM | POA: Diagnosis not present

## 2022-02-20 DIAGNOSIS — R202 Paresthesia of skin: Secondary | ICD-10-CM | POA: Diagnosis not present

## 2022-02-20 DIAGNOSIS — Z5329 Procedure and treatment not carried out because of patient's decision for other reasons: Secondary | ICD-10-CM | POA: Insufficient documentation

## 2022-02-20 LAB — BASIC METABOLIC PANEL
Anion gap: 10 (ref 5–15)
BUN: 9 mg/dL (ref 6–20)
CO2: 22 mmol/L (ref 22–32)
Calcium: 9.6 mg/dL (ref 8.9–10.3)
Chloride: 104 mmol/L (ref 98–111)
Creatinine, Ser: 0.86 mg/dL (ref 0.44–1.00)
GFR, Estimated: >60 mL/min (ref >60)
Glucose, Bld: 78 mg/dL (ref 70–99)
Potassium: 3.6 mmol/L (ref 3.5–5.1)
Sodium: 136 mmol/L (ref 135–145)

## 2022-02-20 LAB — CBC
HCT: 34.1 % — ABNORMAL LOW (ref 36.0–46.0)
Hemoglobin: 11.5 g/dL — ABNORMAL LOW (ref 12.0–15.0)
MCH: 28.2 pg (ref 26.0–34.0)
MCHC: 33.7 g/dL (ref 30.0–36.0)
MCV: 83.6 fL (ref 80.0–100.0)
Platelets: 338 10*3/uL (ref 150–400)
RBC: 4.08 MIL/uL (ref 3.87–5.11)
RDW: 12.7 % (ref 11.5–15.5)
WBC: 9.4 10*3/uL (ref 4.0–10.5)
nRBC: 0 % (ref 0.0–0.2)

## 2022-02-20 LAB — D-DIMER, QUANTITATIVE: D-Dimer, Quant: <0.27 ug/mL-FEU (ref 0.00–0.50)

## 2022-02-20 LAB — TROPONIN I (HIGH SENSITIVITY)
Troponin I (High Sensitivity): <2 ng/L (ref <18)
Troponin I (High Sensitivity): <2 ng/L (ref <18)

## 2022-02-20 LAB — PREGNANCY, URINE: Preg Test, Ur: NEGATIVE

## 2022-02-20 MED ORDER — SODIUM CHLORIDE 0.9 % IV BOLUS
1000.0000 mL | Freq: Once | INTRAVENOUS | Status: AC
  Administered 2022-02-20: 1000 mL via INTRAVENOUS

## 2022-02-20 NOTE — ED Provider Notes (Signed)
?[facility] ?Provider Note ? ? ?CSN: [id number] ?Arrival date & time: 02/20/22  1043 ? ?  ? ?History ? ?Chief Complaint  ?Patient presents with  ? Chest Pain  ? ? ?Nicole [name] is a 46 y.o. female. ? ?Patient presents with chest pain tachycardia tingling to the right upper lip.  Symptoms ongoing since yesterday.  She feels like she is under a lot of stress.  Denies any shortness of breath denies fevers or cough or vomiting or diarrhea. ? ? ?  ? ?Home Medications ?Prior to Admission medications   ?Medication Sig Start Date End Date Taking? Authorizing Provider  ?AIMOVIG 140 MG/ML SOAJ Inject 140 mg into the skin every 30 (thirty) days. 05/25/21   [provider]  ?baclofen (LIORESAL) 10 MG tablet Take 1 tablet (10 mg total) by mouth 3 (three) times daily. 12/28/21   Sharion Balloon, FNP  ?busPIRone (BUSPAR) 5 MG tablet Take 5 mg by mouth 2 (two) times daily. 02/17/21   [provider]  ?cetirizine (ZYRTEC) 10 MG tablet Take 10 mg by mouth daily.    [provider]  ?EPINEPHrine 0.3 mg/0.3 mL IJ SOAJ injection Inject 0.3 mg into the muscle as needed for anaphylaxis (for allergic reaction).    [provider]  ?Fe Cbn-Fe Gluc-FA-B12-C-DSS (FERRALET 90) 90-1 MG TABS Take 1 tablet by mouth daily before breakfast. 01/08/19   Shelly Bombard, MD  ?fluticasone (FLONASE) 50 MCG/ACT nasal spray Place 2 sprays into both nostrils daily. 12/21/20   [provider]  ?levETIRAcetam (KEPPRA) 1000 MG tablet Take 1,000-2,000 mg by mouth See admin instructions. Take '1000mg'$  in the morning and '2000mg'$  at bedtime. 04/08/21   [provider]  ?megestrol (MEGACE) 40 MG tablet Take 1 tablet (40 mg total) by mouth 2 (two) times daily. Can increase to two tablets twice a day in the event of heavy bleeding 12/14/21   [name], MD  ?methocarbamol (ROBAXIN) 500 MG tablet Take 1 tablet (500 mg total) by mouth 2 (two) times daily. 05/23/21   Lacretia Leigh, MD   ?metoprolol succinate (TOPROL-XL) 25 MG 24 hr tablet Take 25 mg by mouth daily. 05/23/21   [provider]  ?montelukast (SINGULAIR) 10 MG tablet Take 1 tablet (10 mg total) by mouth at bedtime. 07/03/19   CiriglianoGarvin Fila, DO  ?oxyCODONE-acetaminophen (PERCOCET/ROXICET) 5-325 MG tablet Take 1 tablet by mouth every 4 (four) hours as needed for severe pain. 12/14/21   [name], MD  ?promethazine (PHENERGAN) 25 MG tablet Take 25 mg by mouth as needed for nausea or vomiting. 04/08/21   [provider]  ?SUMAtriptan (IMITREX) 100 MG tablet Take 100 mg by mouth every 2 (two) hours as needed for migraine. 04/08/21   [provider]  ?Vitamin D, Ergocalciferol, (DRISDOL) 1.25 MG (50000 UNIT) CAPS capsule Take 50,000 Units by mouth once a week. Sundays 06/07/21   [provider]  ?Matilde Bash 1/35 tablet TAKE 1 TABLET BY MOUTH EVERY DAY ?Patient not taking: No sig reported 08/07/19 06/23/21  Shelly Bombard, MD  ?famotidine (PEPCID) 20 MG tablet Take 1 tablet (20 mg total) by mouth daily. ?Patient not taking: No sig reported 11/13/19 06/23/21  Ok Edwards, PA-C  ?ferrous sulfate 325 (65 FE) MG tablet Take 1 tablet (325 mg total) by mouth daily. ?Patient not taking: No sig reported 12/29/18 06/23/21  Petrucelli, Aldona Bar R, PA-C  ?ipratropium (ATROVENT) 0.06 % nasal spray Place 2 sprays into both nostrils 4 (four)  times daily. ?Patient not taking: No sig reported 11/13/19 06/23/21  Ok Edwards, PA-C  ?medroxyPROGESTERone (DEPO-PROVERA) 150 MG/ML injection INJECT 1 ML (150 MG TOTAL) INTO THE MUSCLE EVERY 3 (THREE) MONTHS. ?Patient not taking: No sig reported 03/03/20 06/23/21  Shelly Bombard, MD  ?topiramate (TOPAMAX) 25 MG tablet Take 3 tablets (75 mg total) by mouth 2 (two) times daily. ?Patient not taking: No sig reported 07/07/18 06/23/21  Molpus, Jenny Reichmann, MD  ?   ? ?Allergies    ?Green tea leaf ext, Penicillins, Shellfish allergy, Shrimp extract allergy skin test, Sulfa antibiotics, Tea, and  Sulfamethoxazole   ? ?Review of Systems   ?Review of Systems  ?Constitutional:  Negative for fever.  ?HENT:  Negative for ear pain.   ?Eyes:  Negative for pain.  ?Respiratory:  Negative for cough.   ?Cardiovascular:  Positive for chest pain.  ?Gastrointestinal:  Negative for abdominal pain.  ?Genitourinary:  Negative for flank pain.  ?Musculoskeletal:  Negative for back pain.  ?Skin:  Negative for rash.  ?Neurological:  Negative for headaches.  ? ?Physical Exam ?Updated Vital Signs ?BP 122/86   Pulse 91   Temp 98 ?F (36.7 ?C)   Resp 17   Ht '5\' 5"'$  (1.651 m)   Wt 120.2 kg   LMP 02/19/2022 (Exact Date)   SpO2 97%   BMI 44.10 kg/m?  ?Physical Exam ?Constitutional:   ?   General: She is not in acute distress. ?   Appearance: Normal appearance.  ?HENT:  ?   Head: Normocephalic.  ?   Nose: Nose normal.  ?Eyes:  ?   Extraocular Movements: Extraocular movements intact.  ?Cardiovascular:  ?   Rate and Rhythm: Tachycardia present.  ?Pulmonary:  ?   Effort: Pulmonary effort is normal.  ?Musculoskeletal:     ?   General: Normal range of motion.  ?   Cervical back: Normal range of motion.  ?Neurological:  ?   General: No focal deficit present.  ?   Mental Status: She is alert. Mental status is at baseline.  ? ? ?ED Results / Procedures / Treatments   ?Labs ?(all labs ordered are listed, but only abnormal results are displayed) ?Labs Reviewed  ?CBC - Abnormal; Notable for the following components:  ?    Result Value  ? Hemoglobin 11.5 (*)   ? HCT 34.1 (*)   ? All other components within normal limits  ?BASIC METABOLIC PANEL  ?PREGNANCY, URINE  ?D-DIMER, QUANTITATIVE  ?TROPONIN I (HIGH SENSITIVITY)  ?TROPONIN I (HIGH SENSITIVITY)  ? ? ?EKG ?EKG Interpretation ? ?Date/Time:  Monday February 20 2022 10:49:55 EDT ?Ventricular Rate:  105 ?PR Interval:  136 ?QRS Duration: 76 ?QT Interval:  342 ?QTC Calculation: 452 ?R Axis:   81 ?Text Interpretation: Sinus tachycardia Nonspecific T wave abnormality Abnormal ECG When compared with  ECG of 22-Jun-2021 00:04, Nonspecific T wave abnormality now evident in Inferior leads Nonspecific T wave abnormality now evident in Anterolateral leads Confirmed by Thamas Jaegers ([id number]) on 02/20/2022 10:55:07 AM ? ?Radiology ?DG Chest 2 View ? ?Result Date: 02/20/2022 ?CLINICAL DATA:  chest pain EXAM: CHEST - 2 VIEW COMPARISON:  06/22/2021. FINDINGS: No consolidation. No visible pleural effusions or pneumothorax. Cardiomediastinal silhouette is within normal limits. No evidence of acute osseous abnormality. IMPRESSION: No evidence of acute cardiopulmonary disease. Electronically Signed   By: Margaretha Sheffield M.D.   On: 02/20/2022 11:25   ? ?Procedures ?Procedures  ? ? ?Medications Ordered in ED ?Medications  ?sodium chloride 0.9 % bolus  1,000 mL (0 mLs Intravenous Stopped 02/20/22 1316)  ? ? ?ED Course/ Medical Decision Making/ A&P ?  ?                        ?Medical Decision Making ?Amount and/or Complexity of Data Reviewed ?Labs: ordered. ?Radiology: ordered. ? ? ?Chart review shows outpatient visits January 23, 2022 with OB/GYN physicians. ? ?Patient on the monitor here showing sinus rhythm mild tachycardia. ? ?Differential includes pulm embolism, acute coronary syndrome, pneumothorax, pneumonia versus other. ? ?Work-up included labs CBC chemistry these are unremarkable white count is normal.  D-dimer is negative.  Initial troponin is negative.  Second troponin sent, the patient refused to stay further stating that a family member had called in sick and that she needed to check on them.  Risk and benefits of leaving AGAINST MEDICAL ADVICE discussed.  Patient states that she will return if she feels worse otherwise advised to follow-up closely with her primary care doctor soon as she could. ? ? ? ? ? ? ? ?Final Clinical Impression(s) / ED Diagnoses ?Final diagnoses:  ?Chest pain, unspecified type  ? ? ?Rx / DC Orders ?ED Discharge Orders   ? ? None  ? ?  ? ? ?  ?Luna Fuse, MD ?02/20/22 1325 ? ?

## 2022-02-20 NOTE — ED Notes (Signed)
Patient reports she received a call that her 44 year old grandchild, who she is the guardian over, is sick at school and she has to go get her. Patient is very apologetic at having to leave but has no one else who can pick her up. Patient given strict return precautions. Patient reports if she gets worse she will come back. Patient alert ambulatory and in no active distress at this time. Patient leaving with family ?

## 2022-02-20 NOTE — ED Triage Notes (Signed)
Patient reports to the ER for chest pain and tachycardia. She reports her heart rate has been between 108-125. Patient also reports it feels like her lips are twitching. Denies fevers.  ?

## 2022-04-05 ENCOUNTER — Telehealth: Payer: Self-pay

## 2022-04-05 NOTE — Telephone Encounter (Signed)
Returned call, vm is not set up ?

## 2022-04-10 ENCOUNTER — Emergency Department (HOSPITAL_BASED_OUTPATIENT_CLINIC_OR_DEPARTMENT_OTHER)
Admission: EM | Admit: 2022-04-10 | Discharge: 2022-04-10 | Disposition: A | Discharge status: Home / Self Care | Payer: Commercial Managed Care - HMO | Attending: Emergency Medicine | Admitting: Emergency Medicine

## 2022-04-10 ENCOUNTER — Encounter (HOSPITAL_BASED_OUTPATIENT_CLINIC_OR_DEPARTMENT_OTHER): Payer: Self-pay

## 2022-04-10 ENCOUNTER — Other Ambulatory Visit: Payer: Self-pay

## 2022-04-10 ENCOUNTER — Emergency Department (HOSPITAL_BASED_OUTPATIENT_CLINIC_OR_DEPARTMENT_OTHER): Payer: Commercial Managed Care - HMO | Admitting: Radiology

## 2022-04-10 DIAGNOSIS — M25561 Pain in right knee: Secondary | ICD-10-CM | POA: Insufficient documentation

## 2022-04-10 NOTE — ED Notes (Signed)
Pt ambulated to and from the bathroom by self, steady gait.  ?

## 2022-04-10 NOTE — ED Provider Notes (Signed)
[facility] DEPT Provider Note   CSN: [id number] Arrival date & time: 04/10/22  0747     History  Chief Complaint  Patient presents with   Knee Pain    Nicole [name] is a 46 y.o. female.  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  She bends frequently.  The patient has had knee pain for months.  She was seen in the emergency department in January of this year for the same complaint.  At that time she was referred to orthopedics for further management.  No acute finding was noted.  Today she has no acute complaints.  She states that the pain has increased and has not improved.  She has previously been treated with a cortisone injection which provided 1 week relief and was told to wear a knee sleeve and try Biofreeze.  The patient has history of previous stroke, obesity, pericarditis, right knee pain, sciatica      Home Medications Prior to Admission medications   Medication Sig Start Date End Date Taking? Authorizing Provider  AIMOVIG 140 MG/ML SOAJ Inject 140 mg into the skin every 30 (thirty) days. 05/25/21   [provider]  baclofen (LIORESAL) 10 MG tablet Take 1 tablet (10 mg total) by mouth 3 (three) times daily. 12/28/21   Sharion Balloon, FNP  busPIRone (BUSPAR) 5 MG tablet Take 5 mg by mouth 2 (two) times daily. 02/17/21   [provider]  cetirizine (ZYRTEC) 10 MG tablet Take 10 mg by mouth daily.    [provider]  EPINEPHrine 0.3 mg/0.3 mL IJ SOAJ injection Inject 0.3 mg into the muscle as needed for anaphylaxis (for allergic reaction).    [provider]  Fe Cbn-Fe Gluc-FA-B12-C-DSS (FERRALET 90) 90-1 MG TABS Take 1 tablet by mouth daily before breakfast. 01/08/19   Shelly Bombard, MD  fluticasone [facility]) 50 MCG/ACT nasal spray Place 2 sprays into both nostrils daily. 12/21/20   [provider]   levETIRAcetam (KEPPRA) 1000 MG tablet Take 1,000-2,000 mg by mouth See admin instructions. Take '1000mg'$  in the morning and '2000mg'$  at bedtime. 04/08/21   [provider]  megestrol (MEGACE) 40 MG tablet Take 1 tablet (40 mg total) by mouth 2 (two) times daily. Can increase to two tablets twice a day in the event of heavy bleeding 12/14/21   [name], MD  methocarbamol (ROBAXIN) 500 MG tablet Take 1 tablet (500 mg total) by mouth 2 (two) times daily. 05/23/21   Lacretia Leigh, MD  metoprolol succinate (TOPROL-XL) 25 MG 24 hr tablet Take 25 mg by mouth daily. 05/23/21   [provider]  montelukast (SINGULAIR) 10 MG tablet Take 1 tablet (10 mg total) by mouth at bedtime. 07/03/19   Cirigliano, Garvin Fila, DO  oxyCODONE-acetaminophen (PERCOCET/ROXICET) 5-325 MG tablet Take 1 tablet by mouth every 4 (four) hours as needed for severe pain. 12/14/21   [name], MD  promethazine (PHENERGAN) 25 MG tablet Take 25 mg by mouth as needed for nausea or vomiting. 04/08/21   [provider]  SUMAtriptan (IMITREX) 100 MG tablet Take 100 mg by mouth every 2 (two) hours as needed for migraine. 04/08/21   [provider]  Vitamin D, Ergocalciferol, (DRISDOL) 1.25 MG (50000 UNIT) CAPS capsule Take 50,000 Units by mouth once a week. Sundays 06/07/21   [provider]  Matilde Bash 1/35  tablet TAKE 1 TABLET BY MOUTH EVERY DAY Patient not taking: No sig reported 08/07/19 06/23/21  Shelly Bombard, MD  famotidine (PEPCID) 20 MG tablet Take 1 tablet (20 mg total) by mouth daily. Patient not taking: No sig reported 11/13/19 06/23/21  Ok Edwards, PA-C  ferrous sulfate 325 (65 FE) MG tablet Take 1 tablet (325 mg total) by mouth daily. Patient not taking: No sig reported 12/29/18 06/23/21  Petrucelli, Samantha R, PA-C  ipratropium (ATROVENT) 0.06 % nasal spray Place 2 sprays into both nostrils 4 (four) times daily. Patient not taking: No sig reported 11/13/19 06/23/21  Ok Edwards, PA-C   medroxyPROGESTERone (DEPO-PROVERA) 150 MG/ML injection INJECT 1 ML (150 MG TOTAL) INTO THE MUSCLE EVERY 3 (THREE) MONTHS. Patient not taking: No sig reported 03/03/20 06/23/21  Shelly Bombard, MD  topiramate (TOPAMAX) 25 MG tablet Take 3 tablets (75 mg total) by mouth 2 (two) times daily. Patient not taking: No sig reported 07/07/18 06/23/21  Molpus, Jenny Reichmann, MD      Allergies    Green tea leaf ext, Penicillins, Shellfish allergy, Shrimp extract allergy skin test, Sulfa antibiotics, Tea, and Sulfamethoxazole    Review of Systems   Review of Systems  Musculoskeletal:  Positive for arthralgias.   Physical Exam Updated Vital Signs BP 123/85 (BP Location: Left Arm)   Pulse 96   Temp 98 F (36.7 C)   Resp 16   SpO2 100%  Physical Exam Vitals and nursing note reviewed.  Constitutional:      Appearance: She is obese.  HENT:     Head: Normocephalic.  Eyes:     Conjunctiva/sclera: Conjunctivae normal.  Cardiovascular:     Rate and Rhythm: Normal rate and regular rhythm.     Pulses: Normal pulses.     Heart sounds: Normal heart sounds.  Pulmonary:     Effort: Pulmonary effort is normal.     Breath sounds: Normal breath sounds.  Musculoskeletal:        General: Swelling (Minimal swelling noted of bilateral knees no effusion) and tenderness (Right knee tender to general palpation) present. No signs of injury. Normal range of motion.     Cervical back: Normal range of motion.     Right lower leg: No edema.     Left lower leg: No edema.  Skin:    General: Skin is warm and dry.  Neurological:     Mental Status: She is alert.    ED Results / Procedures / Treatments   Labs (all labs ordered are listed, but only abnormal results are displayed) Labs Reviewed - No data to display  EKG None  Radiology DG Knee Complete 4 Views Right  Result Date: 04/10/2022 CLINICAL DATA:  Knee pain over the last 3 weeks which is worsening. No known injury. EXAM: RIGHT KNEE - COMPLETE 4+ VIEW  COMPARISON:  12/28/2021 FINDINGS: No evidence of fracture, dislocation, or joint effusion. No evidence of arthropathy or other focal bone abnormality. Soft tissues are unremarkable. IMPRESSION: Negative radiographs. Lateral view is slightly rotated which makes detection of the subtle patellofemoral degenerative changes seen previously difficult to evaluate. Electronically Signed   By: Nelson Chimes M.D.   On: 04/10/2022 08:38    Procedures Procedures    Medications Ordered in ED Medications - No data to display  ED Course/ Medical Decision Making/ A&P Clinical Course as of 04/10/22 7858  Mon Apr 10, 2022  8502 DG Knee Complete 4 Views Right [LM]    Clinical Course User  Index [LM] Ronny Bacon                           Medical Decision Making Amount and/or Complexity of Data Reviewed Radiology: ordered.   The patient complaints of chronic right knee pain.  The patient states that she felt that current treatments by orthopedics were not providing relief.  The patient has no acute issue, just continued pain in the right knee.  Differential includes arthritis, tendinitis, meniscal injury, ligamentous injury, septic arthritis, and others  With no acute injury, meniscal injury, ligamentous injury unlikely.  The knee is not warm, erythematous or swollen.  Septic arthritis unlikely. I see no reason for lab work at today's visit  I ordered and interpreted imaging which included complete images of the right knee.  No fracture or dislocation noted.  Subtle patellofemoral arthritic changes noted.  I agree with the radiologist finding.  This is likely pain due to arthritis, tendinitis, or due to potential deconditioning.  Patient's previous stroke left her with right-sided deficits and the patient states she is unable to do squats and other exercises to strengthen that leg.  I recommended return to orthopedics for further follow-up and work-up.  At the patient's request I will provide  today's on-call orthopedic provider's contact information.  The patient has no interest in narcotic medication which I would not recommend typically for knee pain in the first place.  I would normally recommend an anti-inflammatory such as meloxicam, but the patient states she is unable to take NSAIDs due to a potential GI bleed which is being investigated at this time.  The patient may continue to take Tylenol at home.  I did recommend considering Voltaren gel for the right knee as this should have minimal systemic effects.  Follow-up outpatient with orthopedics.  Final Clinical Impression(s) / ED Diagnoses Final diagnoses:  Right knee pain, unspecified chronicity    Rx / DC Orders ED Discharge Orders     None         Ronny Bacon 04/10/22 4854    Luna Fuse, MD 04/28/22 2158

## 2022-04-10 NOTE — Discharge Instructions (Addendum)
You were seen today for right knee pain.  As discussed, I am unable to provide prescription anti-inflammatories due to your potential GI bleed.  Recommend continuing Tylenol at home and following up with orthopedics.  You may consider using Voltaren gel on the right knee as it has minimal systemic effects. ?

## 2022-04-10 NOTE — ED Triage Notes (Signed)
Pt presents with Right knee pain x3 weeks. Reports it's "messing with my back". Pt here d/t no relief  ?

## 2022-04-12 ENCOUNTER — Ambulatory Visit (HOSPITAL_BASED_OUTPATIENT_CLINIC_OR_DEPARTMENT_OTHER): Payer: Managed Care, Other (non HMO) | Admitting: Orthopaedic Surgery

## 2022-05-18 ENCOUNTER — Ambulatory Visit: Payer: Managed Care, Other (non HMO) | Admitting: Obstetrics and Gynecology

## 2022-05-29 IMAGING — DX DG FOOT COMPLETE 3+V*L*
3 series · 3 of 3 positions shown · non-contrast
Comparison: None.

CLINICAL DATA: Left foot pain, recent seizure and fall

EXAM:
LEFT FOOT - COMPLETE 3+ VIEW

[foot ap]
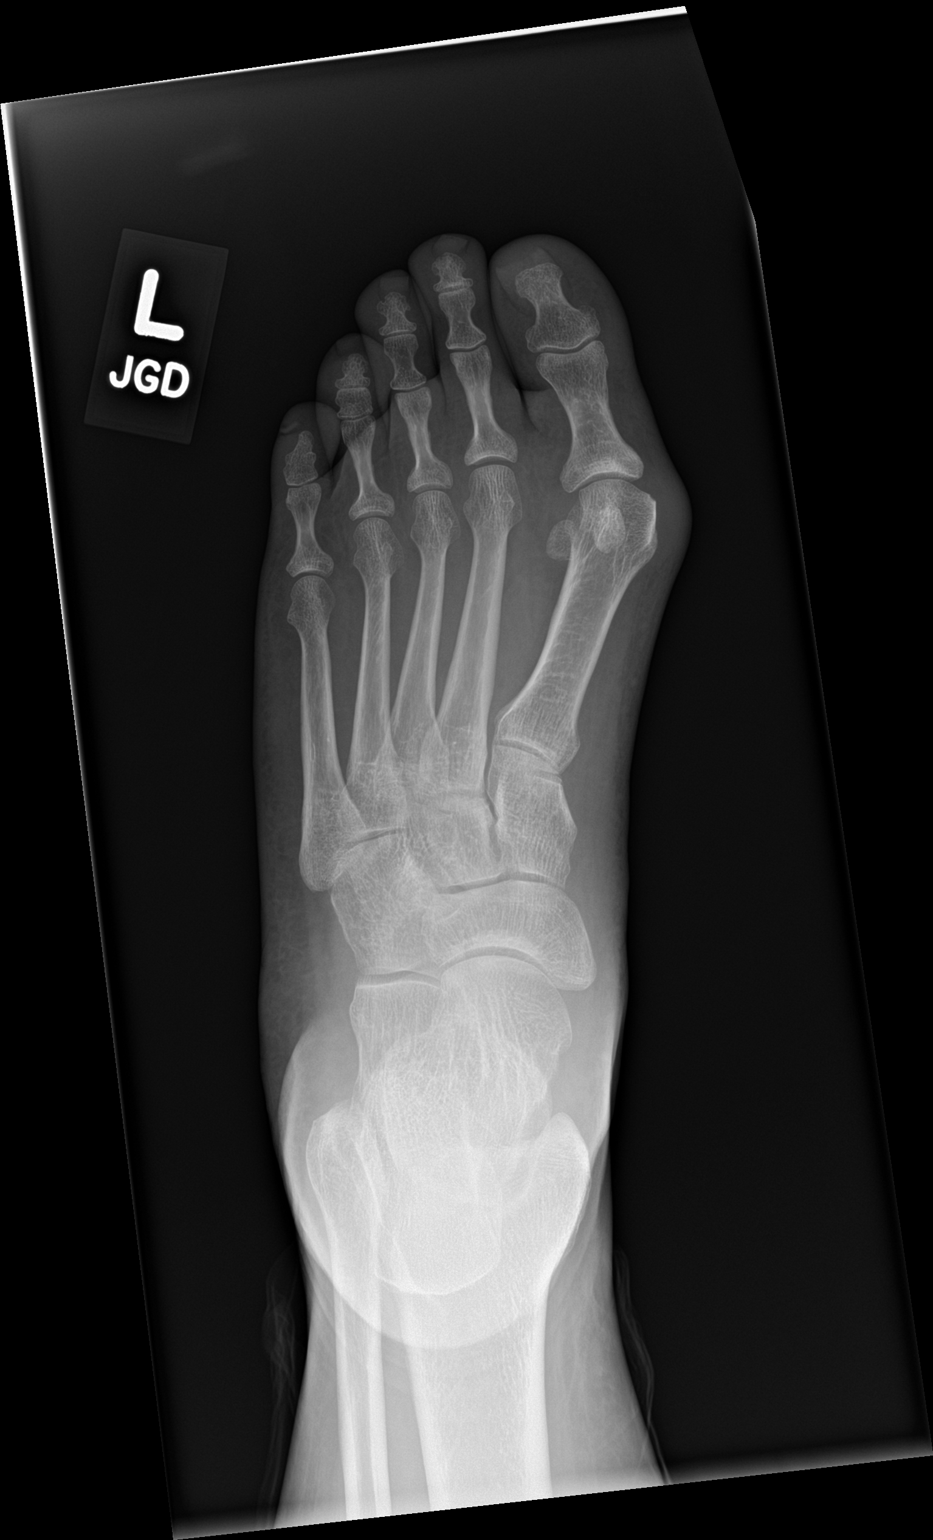

[foot obl]
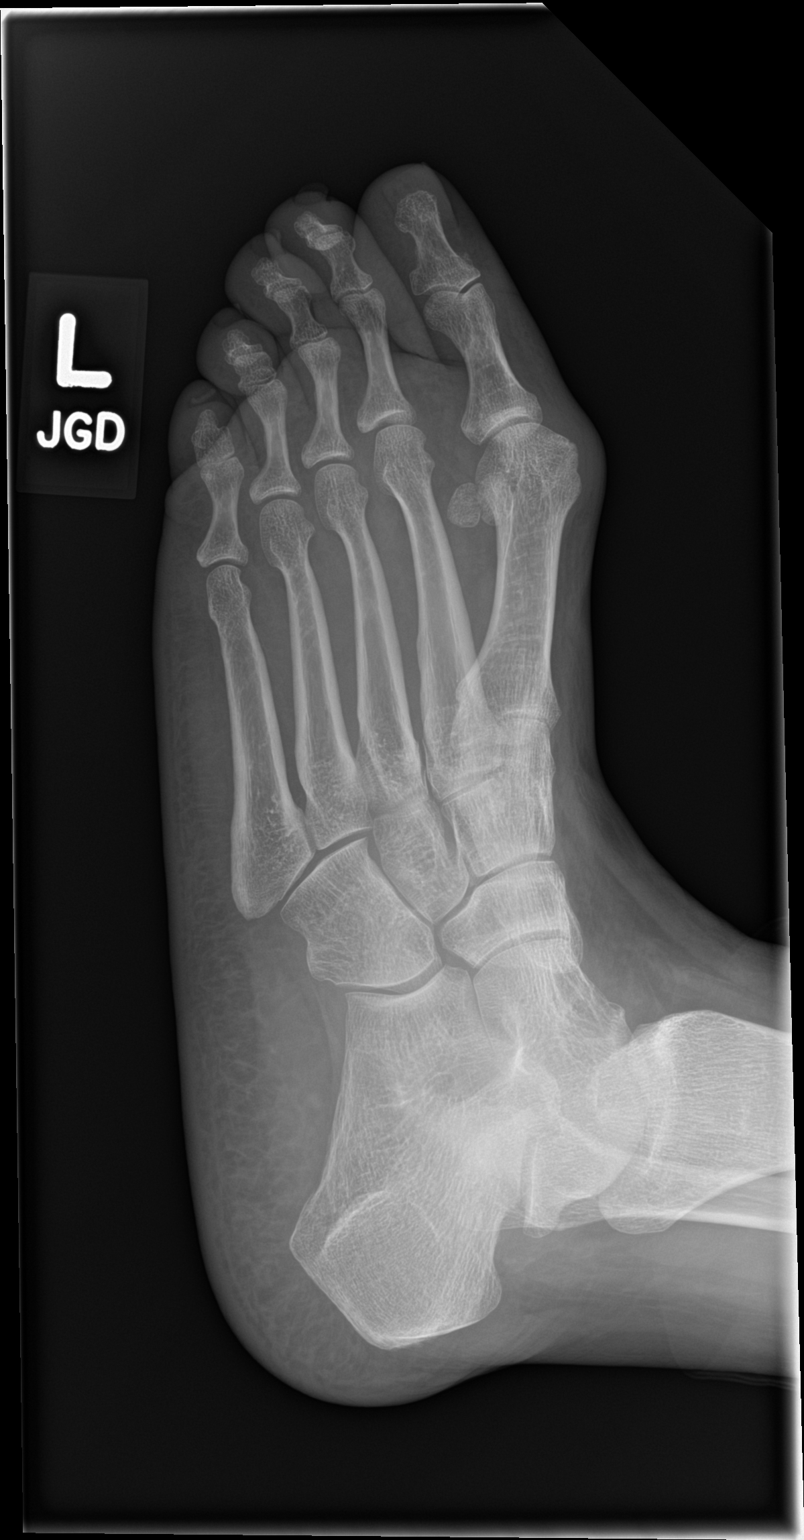

[foot lat]
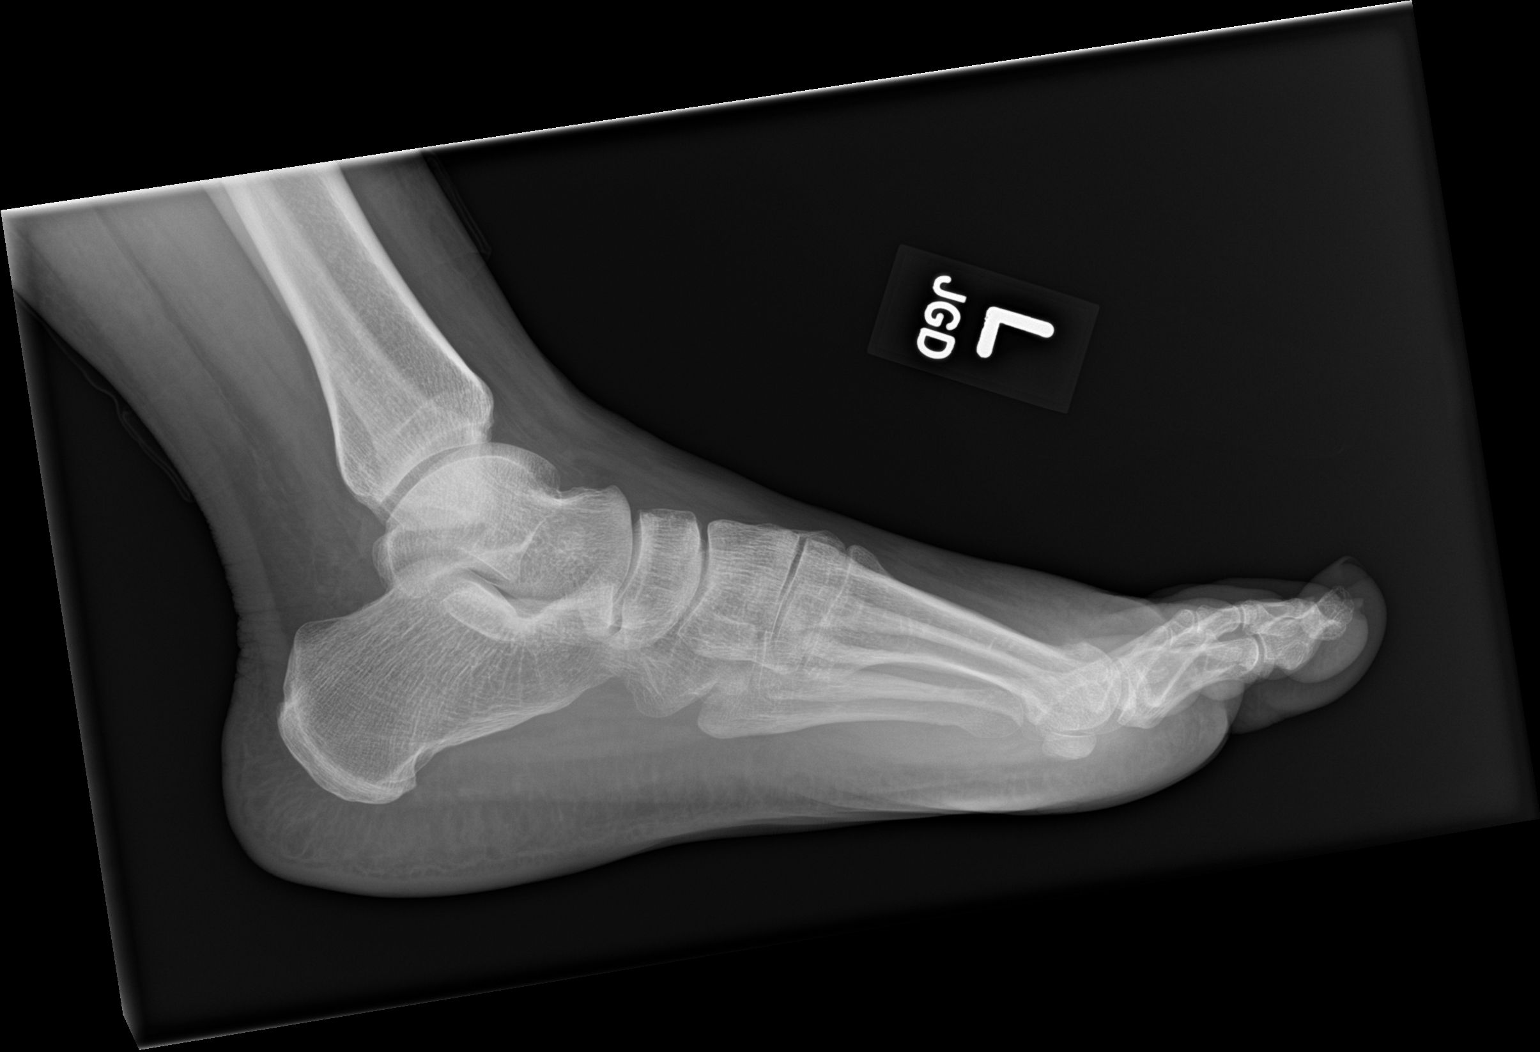

[3 of 3 positions shown; findings below may reference images not displayed]

FINDINGS: Three view radiograph left foot demonstrates a mild first ray bunion
deformity. Otherwise normal alignment. No fracture or dislocation.
Joint spaces are preserved. No ankle effusion. Soft tissues are
unremarkable.
IMPRESSION: No acute fracture or dislocation.

## 2022-06-04 ENCOUNTER — Other Ambulatory Visit: Payer: Self-pay

## 2022-06-04 ENCOUNTER — Emergency Department (HOSPITAL_BASED_OUTPATIENT_CLINIC_OR_DEPARTMENT_OTHER): Payer: Self-pay

## 2022-06-04 ENCOUNTER — Encounter (HOSPITAL_BASED_OUTPATIENT_CLINIC_OR_DEPARTMENT_OTHER): Payer: Self-pay

## 2022-06-04 ENCOUNTER — Emergency Department (HOSPITAL_BASED_OUTPATIENT_CLINIC_OR_DEPARTMENT_OTHER)
Admission: EM | Admit: 2022-06-04 | Discharge: 2022-06-05 | Disposition: A | Discharge status: Home / Self Care | Payer: Self-pay | Attending: Student | Admitting: Student

## 2022-06-04 DIAGNOSIS — J45909 Unspecified asthma, uncomplicated: Secondary | ICD-10-CM | POA: Insufficient documentation

## 2022-06-04 DIAGNOSIS — J0100 Acute maxillary sinusitis, unspecified: Secondary | ICD-10-CM

## 2022-06-04 DIAGNOSIS — Z79899 Other long term (current) drug therapy: Secondary | ICD-10-CM | POA: Insufficient documentation

## 2022-06-04 DIAGNOSIS — R519 Headache, unspecified: Secondary | ICD-10-CM | POA: Insufficient documentation

## 2022-06-04 DIAGNOSIS — Z87891 Personal history of nicotine dependence: Secondary | ICD-10-CM | POA: Insufficient documentation

## 2022-06-04 DIAGNOSIS — Z20822 Contact with and (suspected) exposure to covid-19: Secondary | ICD-10-CM | POA: Insufficient documentation

## 2022-06-04 DIAGNOSIS — R0981 Nasal congestion: Secondary | ICD-10-CM | POA: Insufficient documentation

## 2022-06-04 DIAGNOSIS — R110 Nausea: Secondary | ICD-10-CM | POA: Insufficient documentation

## 2022-06-04 DIAGNOSIS — R42 Dizziness and giddiness: Secondary | ICD-10-CM | POA: Insufficient documentation

## 2022-06-04 DIAGNOSIS — R079 Chest pain, unspecified: Secondary | ICD-10-CM | POA: Insufficient documentation

## 2022-06-04 DIAGNOSIS — M791 Myalgia, unspecified site: Secondary | ICD-10-CM

## 2022-06-04 LAB — BASIC METABOLIC PANEL
Anion gap: 10 (ref 5–15)
BUN: 11 mg/dL (ref 6–20)
CO2: 23 mmol/L (ref 22–32)
Calcium: 9.4 mg/dL (ref 8.9–10.3)
Chloride: 107 mmol/L (ref 98–111)
Creatinine, Ser: 1 mg/dL (ref 0.44–1.00)
GFR, Estimated: >60 mL/min (ref >60)
Glucose, Bld: 97 mg/dL (ref 70–99)
Potassium: 3.3 mmol/L — ABNORMAL LOW (ref 3.5–5.1)
Sodium: 140 mmol/L (ref 135–145)

## 2022-06-04 LAB — RESP PANEL BY RT-PCR (FLU A&B, COVID) ARPGX2
Influenza A by PCR: NEGATIVE
Influenza B by PCR: NEGATIVE
SARS Coronavirus 2 by RT PCR: NEGATIVE

## 2022-06-04 LAB — CBC
HCT: 34.2 % — ABNORMAL LOW (ref 36.0–46.0)
Hemoglobin: 11.3 g/dL — ABNORMAL LOW (ref 12.0–15.0)
MCH: 28.4 pg (ref 26.0–34.0)
MCHC: 33 g/dL (ref 30.0–36.0)
MCV: 85.9 fL (ref 80.0–100.0)
Platelets: 288 10*3/uL (ref 150–400)
RBC: 3.98 MIL/uL (ref 3.87–5.11)
RDW: 12.9 % (ref 11.5–15.5)
WBC: 8.1 10*3/uL (ref 4.0–10.5)
nRBC: 0 % (ref 0.0–0.2)

## 2022-06-04 LAB — PREGNANCY, URINE: Preg Test, Ur: NEGATIVE

## 2022-06-04 LAB — TROPONIN I (HIGH SENSITIVITY)
Troponin I (High Sensitivity): <2 ng/L (ref <18)
Troponin I (High Sensitivity): <2 ng/L (ref <18)

## 2022-06-04 MED ORDER — KETOROLAC TROMETHAMINE 15 MG/ML IJ SOLN
15.0000 mg | Freq: Once | INTRAMUSCULAR | Status: AC
  Administered 2022-06-04: 15 mg via INTRAVENOUS
  Filled 2022-06-04: qty 1

## 2022-06-04 MED ORDER — PROCHLORPERAZINE EDISYLATE 10 MG/2ML IJ SOLN
10.0000 mg | Freq: Once | INTRAMUSCULAR | Status: AC
  Administered 2022-06-04: 10 mg via INTRAVENOUS
  Filled 2022-06-04: qty 2

## 2022-06-04 MED ORDER — LACTATED RINGERS IV BOLUS
1000.0000 mL | Freq: Once | INTRAVENOUS | Status: AC
  Administered 2022-06-04: 1000 mL via INTRAVENOUS

## 2022-06-04 MED ORDER — DIPHENHYDRAMINE HCL 50 MG/ML IJ SOLN
25.0000 mg | Freq: Once | INTRAMUSCULAR | Status: AC
  Administered 2022-06-04: 25 mg via INTRAVENOUS
  Filled 2022-06-04: qty 1

## 2022-06-04 MED ORDER — MAGNESIUM OXIDE -MG SUPPLEMENT 400 (240 MG) MG PO TABS
800.0000 mg | ORAL_TABLET | Freq: Once | ORAL | Status: AC
  Administered 2022-06-04: 800 mg via ORAL
  Filled 2022-06-04: qty 2

## 2022-06-04 MED ORDER — POTASSIUM CHLORIDE CRYS ER 20 MEQ PO TBCR
40.0000 meq | EXTENDED_RELEASE_TABLET | Freq: Once | ORAL | Status: AC
  Administered 2022-06-04: 40 meq via ORAL
  Filled 2022-06-04: qty 2

## 2022-06-05 ENCOUNTER — Other Ambulatory Visit: Payer: Self-pay | Admitting: Obstetrics and Gynecology

## 2022-06-05 DIAGNOSIS — D259 Leiomyoma of uterus, unspecified: Secondary | ICD-10-CM

## 2022-06-05 MED ORDER — DOXYCYCLINE HYCLATE 100 MG PO CAPS: 100.0000 mg | ORAL_CAPSULE | Freq: Two times a day (BID) | ORAL | 0 refills | Status: DC

## 2022-07-22 IMAGING — MG DIGITAL DIAGNOSTIC BILAT W/ TOMO W/ CAD
6 of 12 series · 6 of 36 positions shown · non-contrast
Comparison: None.

CLINICAL DATA: Intermittent bilateral nonfocal breast pain in
varying locations, anterior in both breasts today. She also reports
bilateral spontaneous clear nipple discharge for the past month.
Family history of breast cancer in her mother diagnosed at age 35,
maternal grandmother and paternal grandmother.

EXAM:
DIGITAL DIAGNOSTIC BILATERAL MAMMOGRAM WITH TOMOSYNTHESIS AND CAD
TECHNIQUE: Bilateral digital diagnostic mammography and breast tomosynthesis
was performed. The images were evaluated with computer-aided
detection.

[L CC synth-2D]
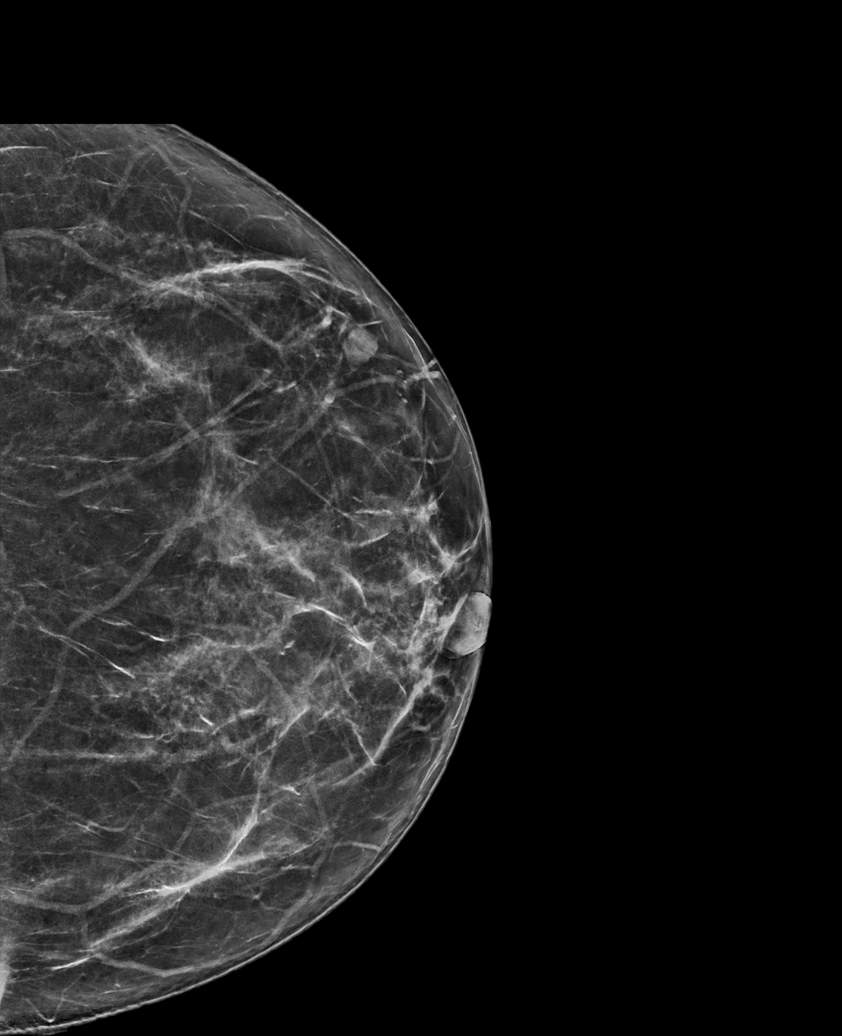

[R TAN synth-2D]
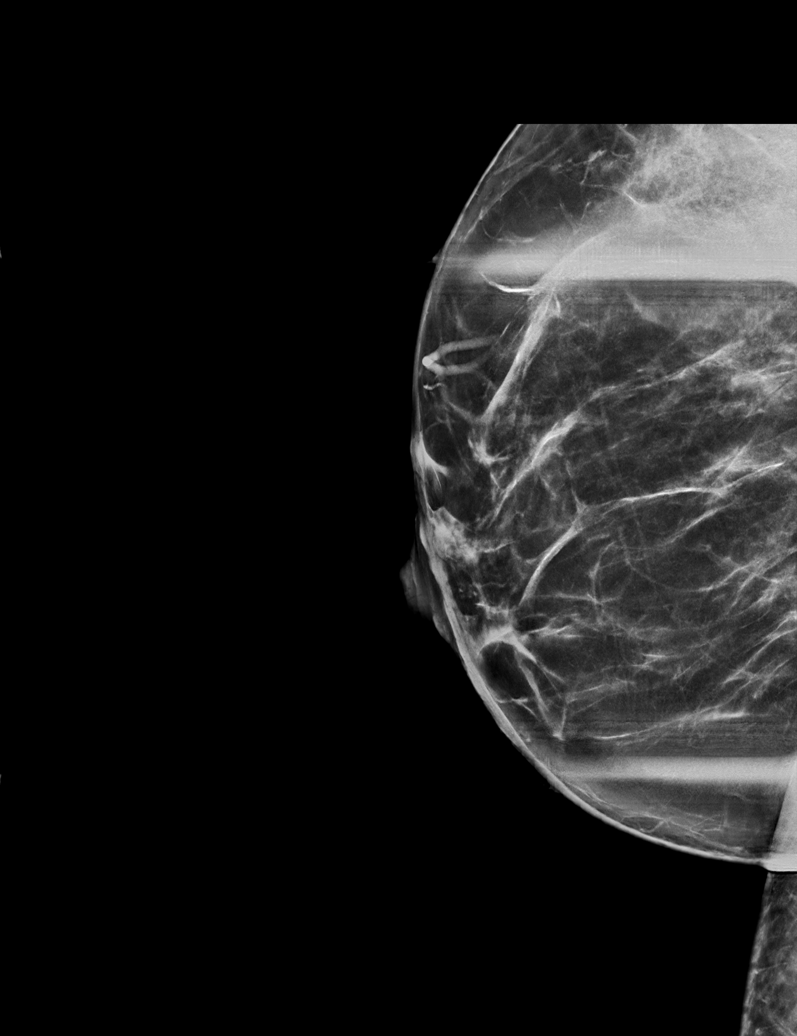

[R CC synth-2D]
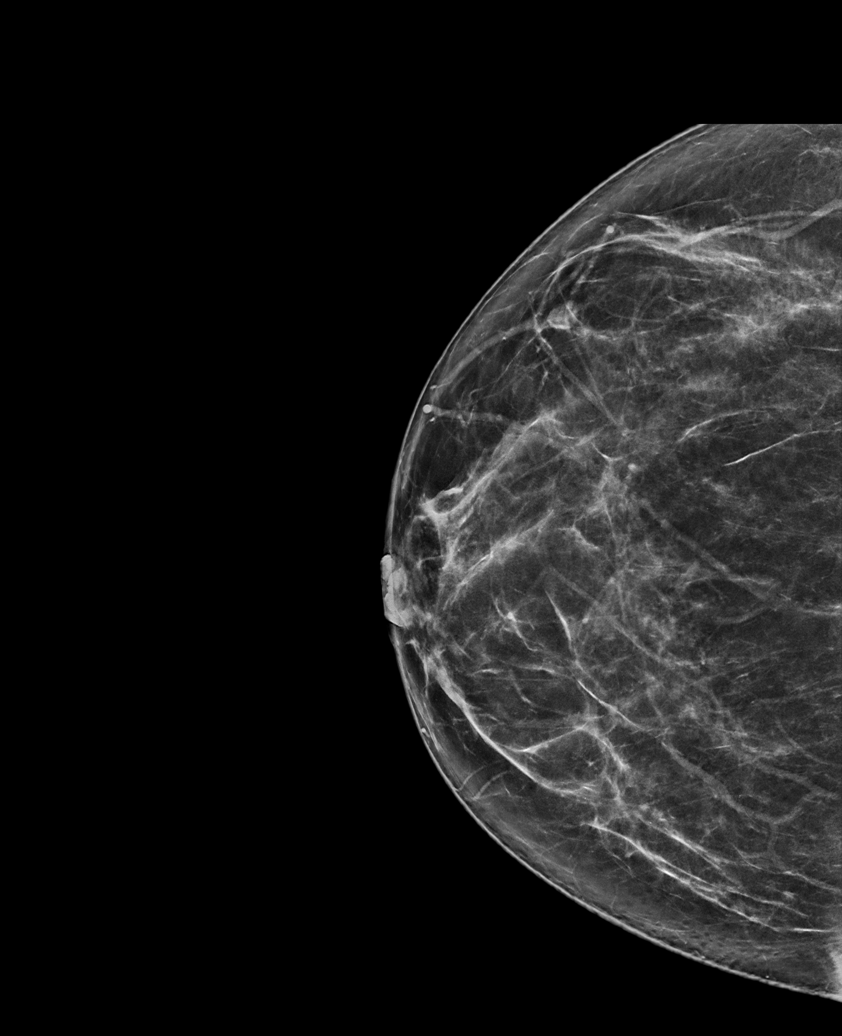

[R MLO synth-2D]
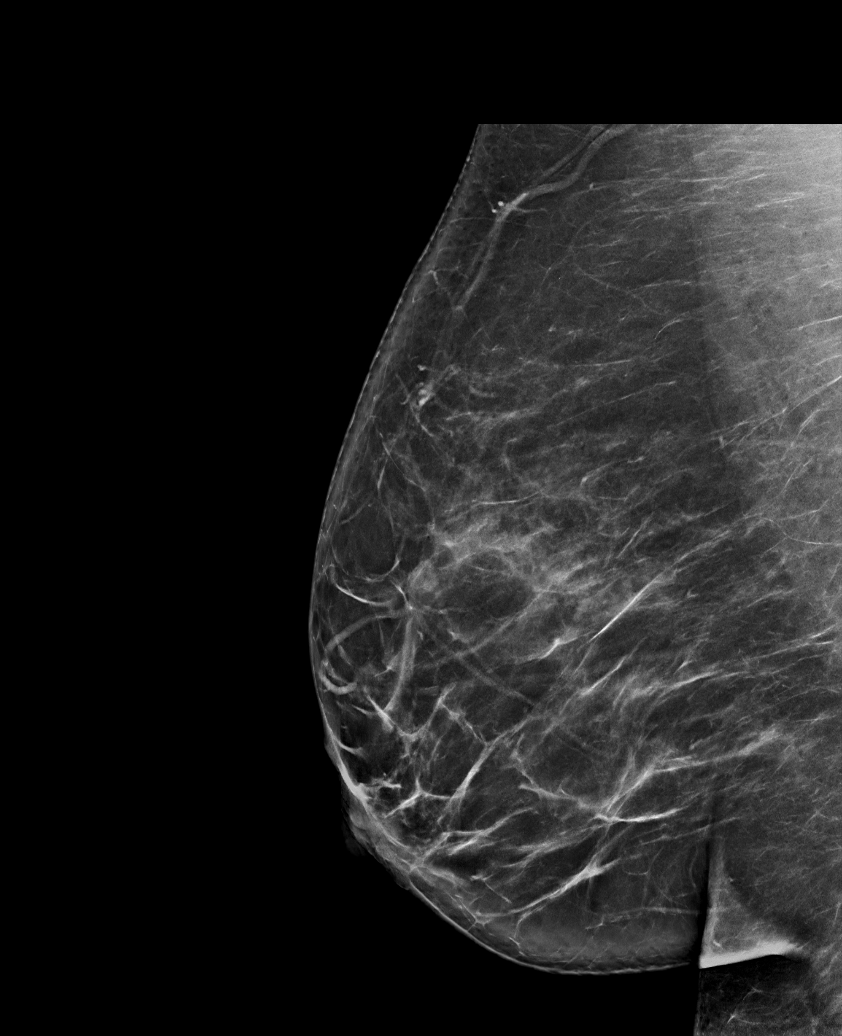

[L TAN synth-2D]
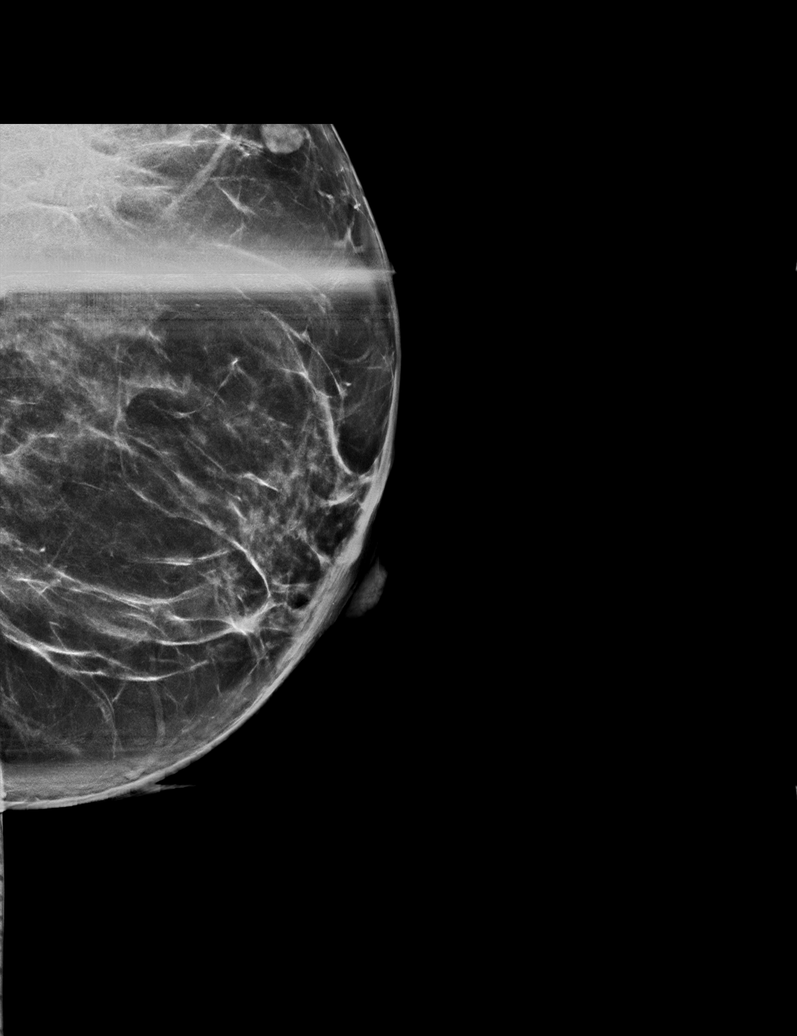

[L MLO synth-2D]
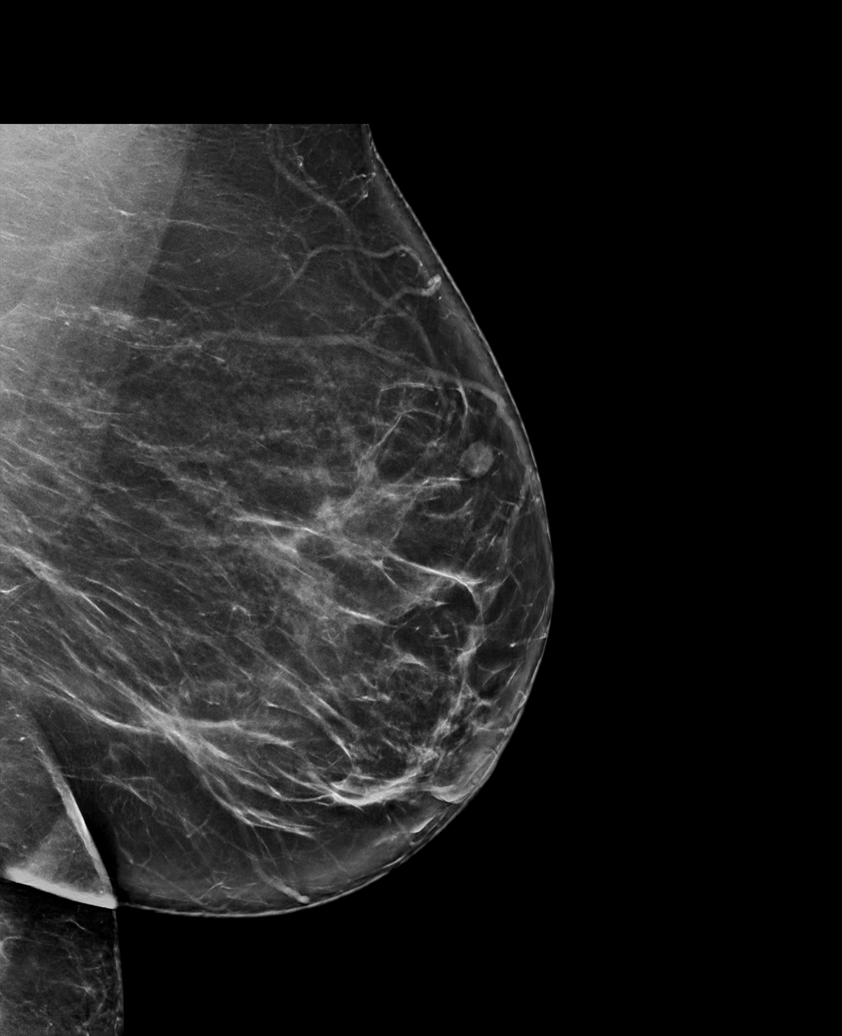

[6 of 36 positions shown; findings below may reference images not displayed]

Baseline.

ACR Breast Density Category b: There are scattered areas of
fibroglandular density.
FINDINGS: Mammographically normal appearing breasts with no findings
suspicious for malignancy in either breast.
IMPRESSION: No evidence of malignancy.

RECOMMENDATION:
Bilateral screening mammogram in 1 year.

I have discussed the findings and recommendations with the patient.
If applicable, a reminder letter will be sent to the patient
regarding the next appointment.

BI-RADS CATEGORY  1: Negative.

## 2022-08-18 IMAGING — CR DG CHEST 2V
2 series · 2 of 2 positions shown · non-contrast
Comparison: 10/17/2019

CLINICAL DATA: Chest pain on the right

EXAM:
CHEST - 2 VIEW

[w chest pa]
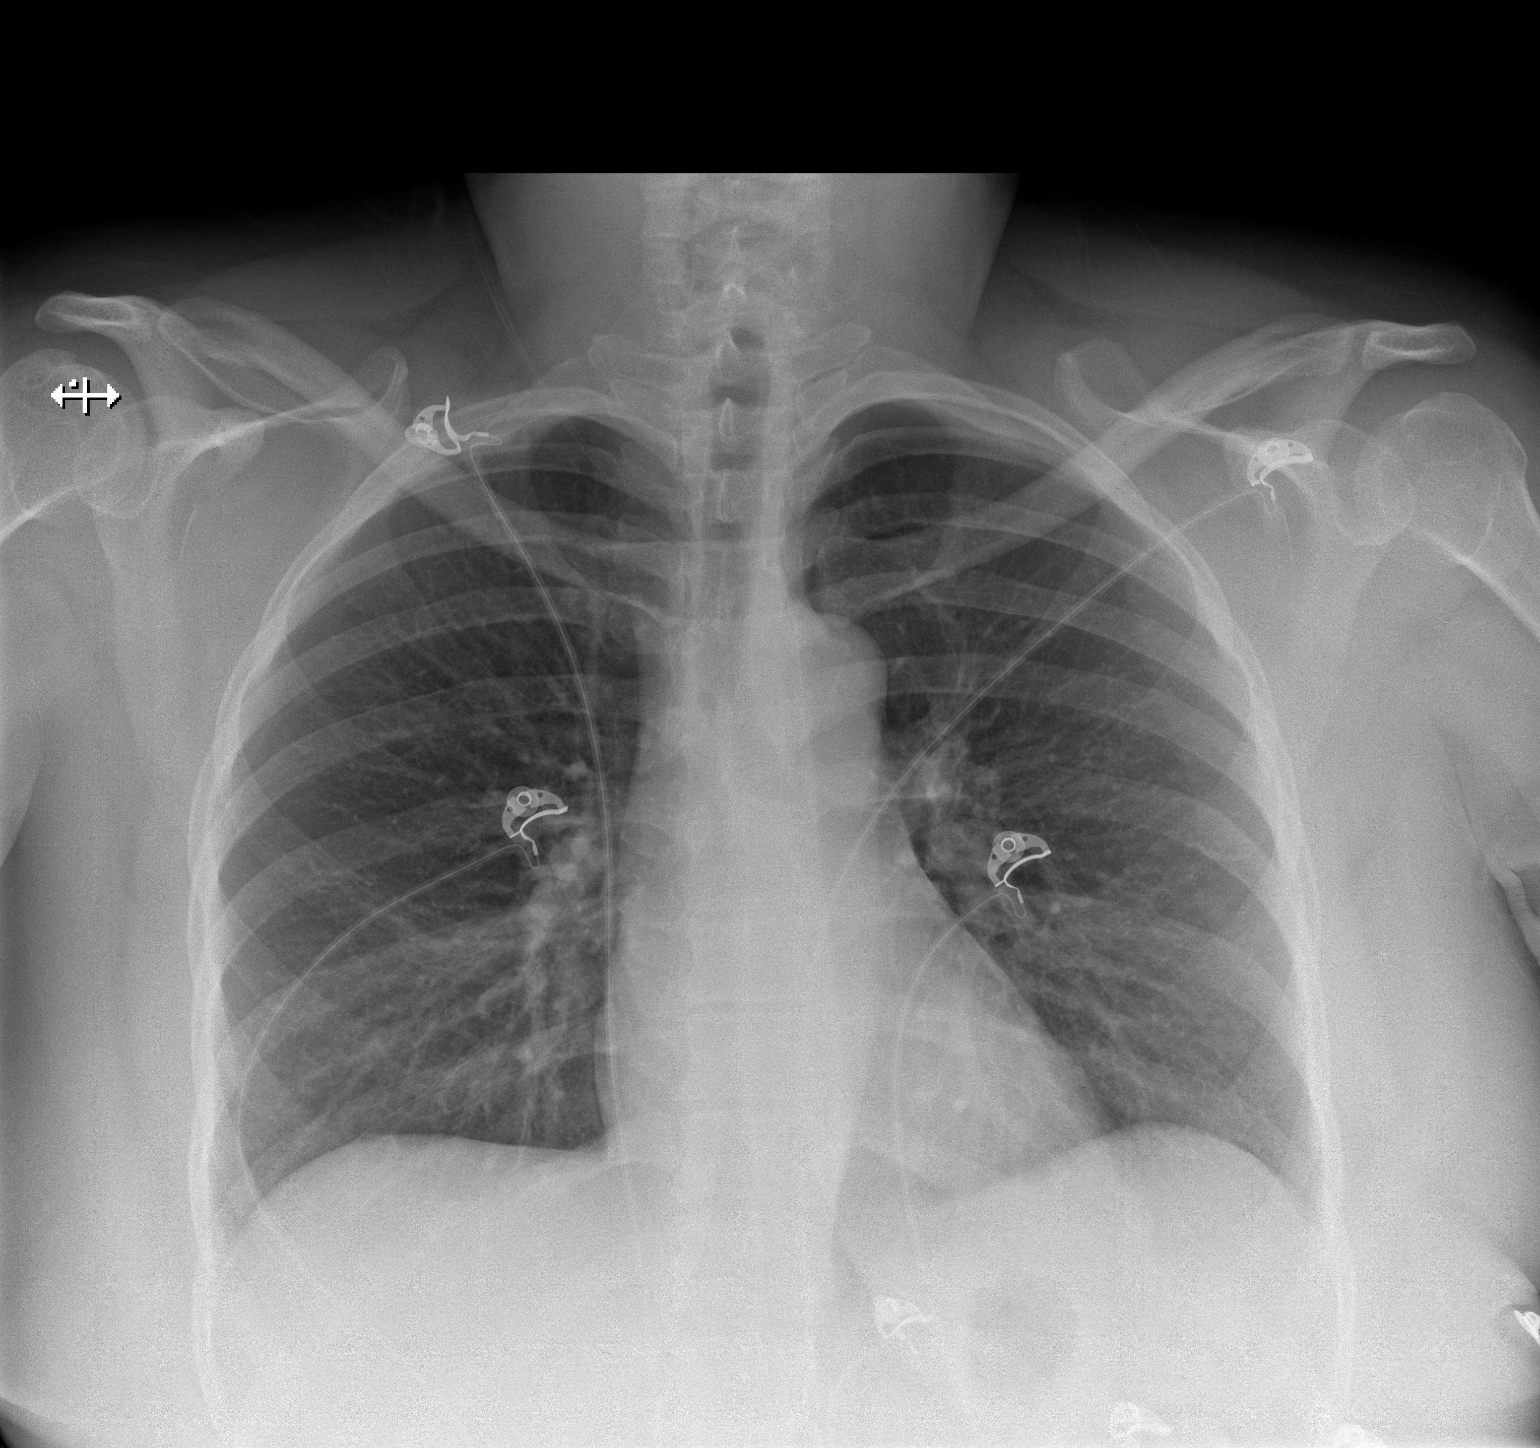

[w chest lat]
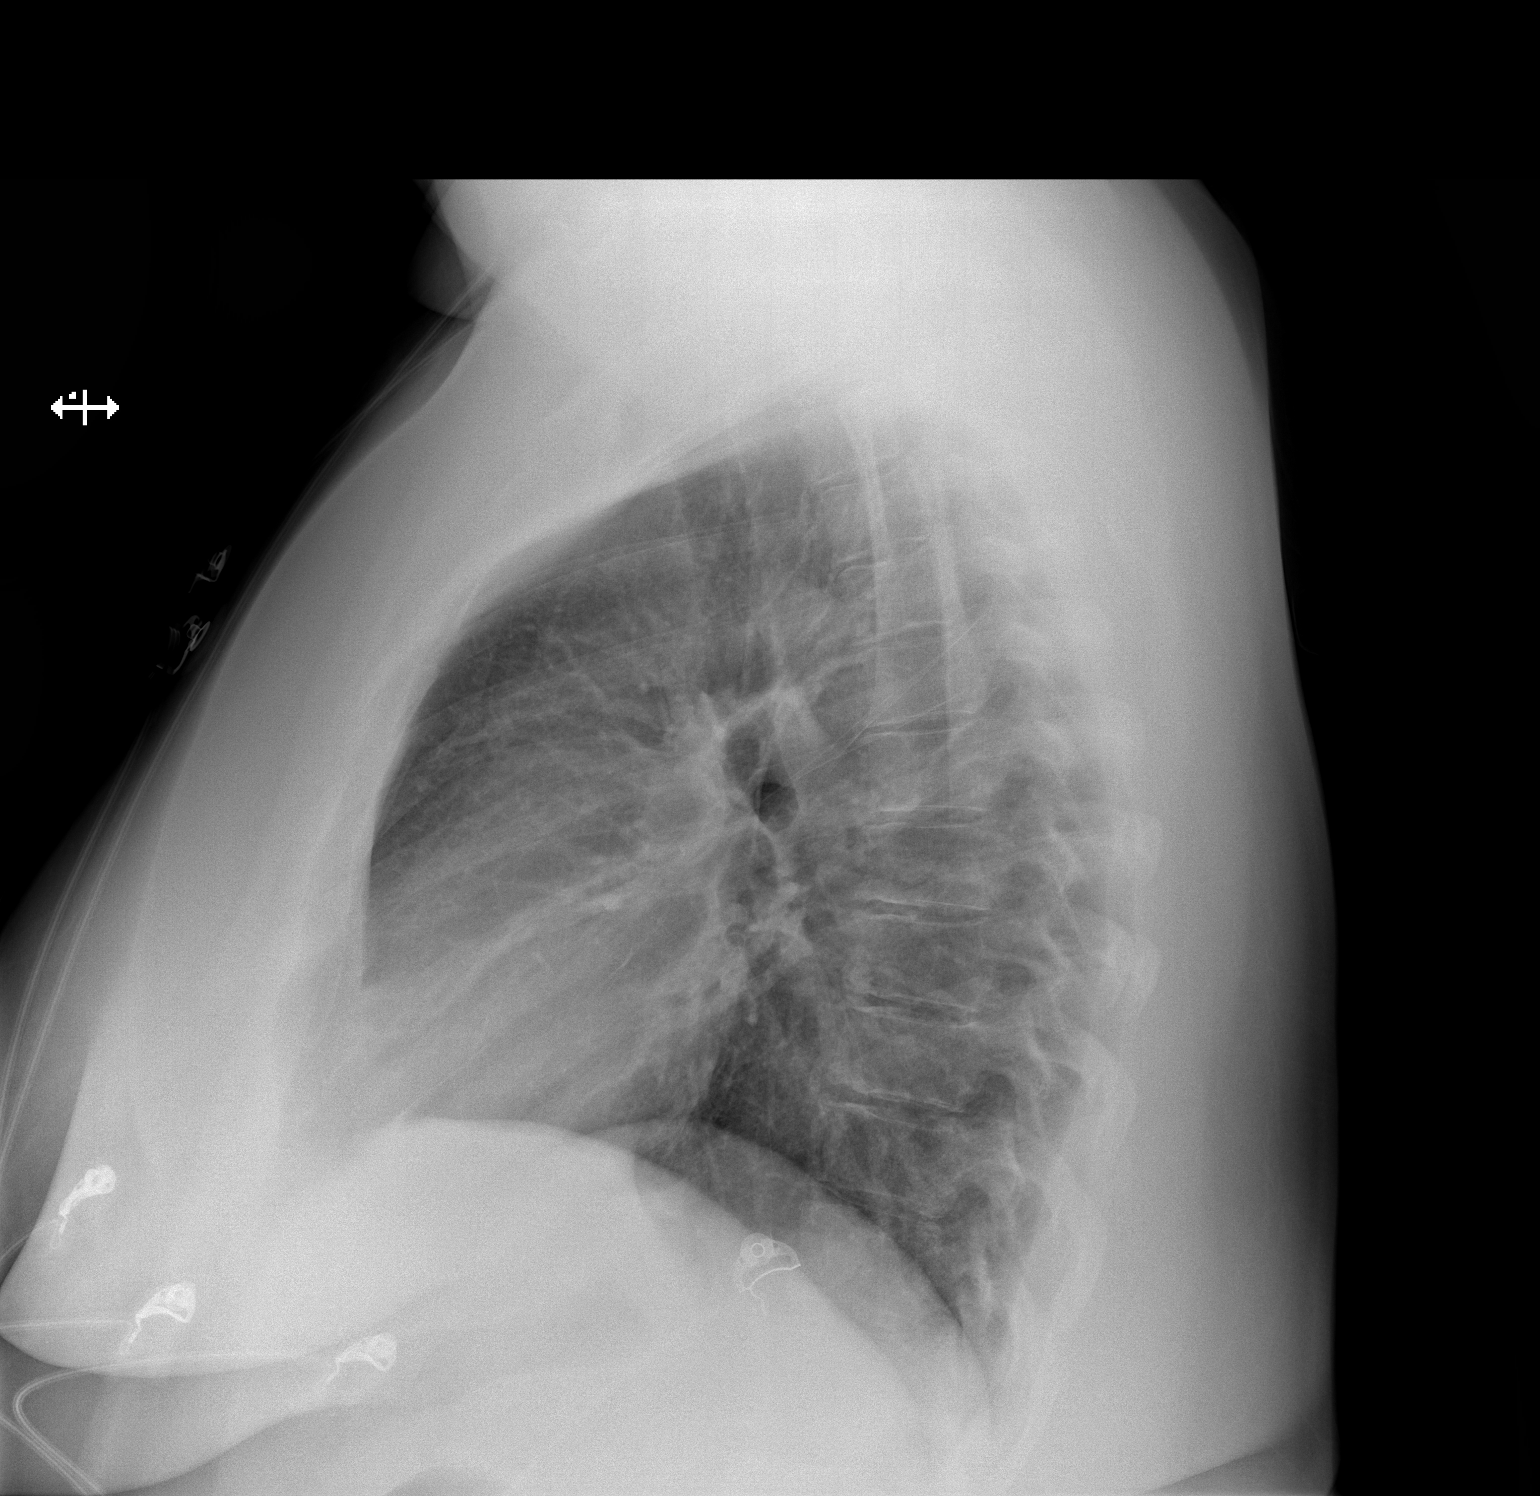

[2 of 2 positions shown; findings below may reference images not displayed]

FINDINGS: The heart size and mediastinal contours are within normal limits.
Both lungs are clear. The visualized skeletal structures are
unremarkable.
IMPRESSION: No active cardiopulmonary disease.

## 2022-09-17 IMAGING — DX DG CHEST 1V PORT
1 series · 1 of 1 positions shown · non-contrast
Comparison: 05/23/2021

CLINICAL DATA: Chest pain

EXAM:
PORTABLE CHEST 1 VIEW

[chest ap]
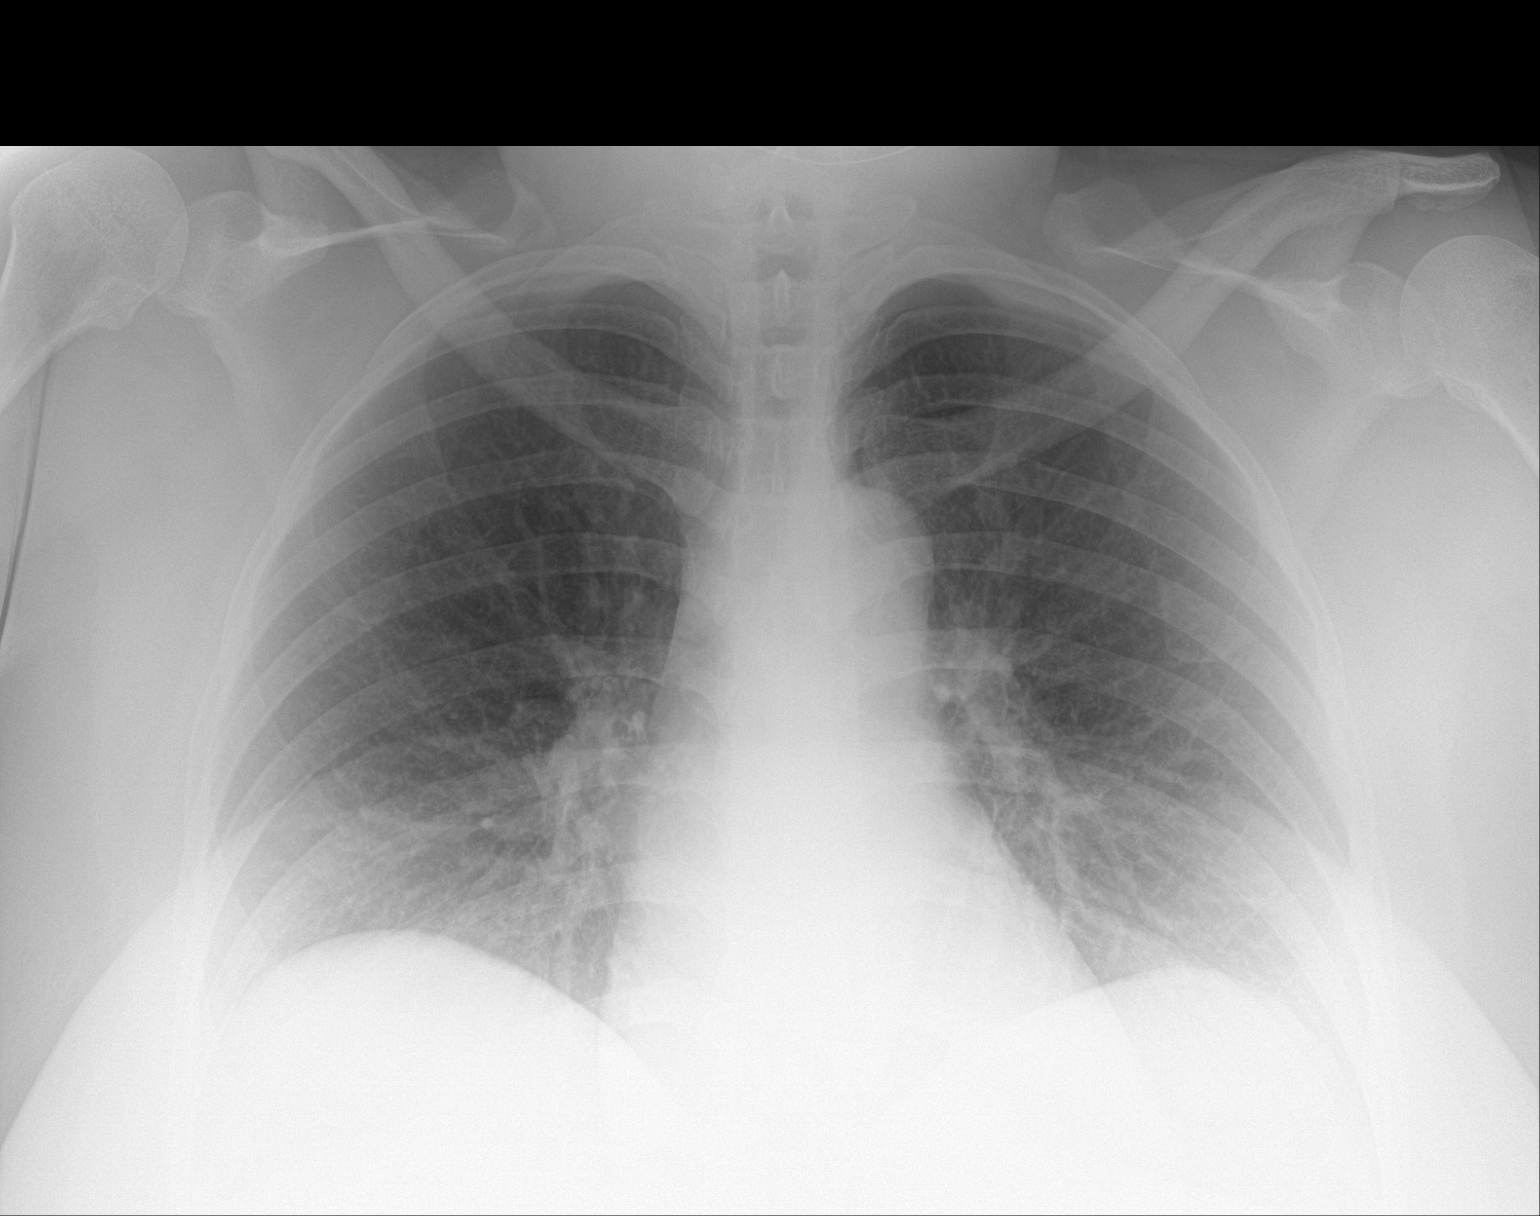

[1 of 1 positions shown; findings below may reference images not displayed]

FINDINGS: The heart size and mediastinal contours are within normal limits.
Both lungs are clear. The visualized skeletal structures are
unremarkable.
IMPRESSION: No active disease.

## 2022-11-05 ENCOUNTER — Emergency Department (HOSPITAL_BASED_OUTPATIENT_CLINIC_OR_DEPARTMENT_OTHER)
Admission: EM | Admit: 2022-11-05 | Discharge: 2022-11-05 | Disposition: A | Discharge status: Home / Self Care | Payer: 59 | Attending: Emergency Medicine | Admitting: Emergency Medicine

## 2022-11-05 ENCOUNTER — Emergency Department (HOSPITAL_BASED_OUTPATIENT_CLINIC_OR_DEPARTMENT_OTHER): Payer: 59

## 2022-11-05 ENCOUNTER — Other Ambulatory Visit: Payer: Self-pay

## 2022-11-05 ENCOUNTER — Encounter (HOSPITAL_BASED_OUTPATIENT_CLINIC_OR_DEPARTMENT_OTHER): Payer: Self-pay | Admitting: Emergency Medicine

## 2022-11-05 DIAGNOSIS — R1031 Right lower quadrant pain: Secondary | ICD-10-CM

## 2022-11-05 DIAGNOSIS — N939 Abnormal uterine and vaginal bleeding, unspecified: Secondary | ICD-10-CM | POA: Diagnosis not present

## 2022-11-05 DIAGNOSIS — R350 Frequency of micturition: Secondary | ICD-10-CM | POA: Insufficient documentation

## 2022-11-05 DIAGNOSIS — D649 Anemia, unspecified: Secondary | ICD-10-CM | POA: Insufficient documentation

## 2022-11-05 DIAGNOSIS — D259 Leiomyoma of uterus, unspecified: Secondary | ICD-10-CM | POA: Insufficient documentation

## 2022-11-05 DIAGNOSIS — Z8673 Personal history of transient ischemic attack (TIA), and cerebral infarction without residual deficits: Secondary | ICD-10-CM | POA: Insufficient documentation

## 2022-11-05 DIAGNOSIS — R102 Pelvic and perineal pain: Secondary | ICD-10-CM | POA: Diagnosis not present

## 2022-11-05 DIAGNOSIS — N920 Excessive and frequent menstruation with regular cycle: Secondary | ICD-10-CM | POA: Diagnosis not present

## 2022-11-05 DIAGNOSIS — Z79899 Other long term (current) drug therapy: Secondary | ICD-10-CM | POA: Diagnosis not present

## 2022-11-05 LAB — BASIC METABOLIC PANEL
Anion gap: 6 (ref 5–15)
BUN: 8 mg/dL (ref 6–20)
CO2: 28 mmol/L (ref 22–32)
Calcium: 8.6 mg/dL — ABNORMAL LOW (ref 8.9–10.3)
Chloride: 105 mmol/L (ref 98–111)
Creatinine, Ser: 0.86 mg/dL (ref 0.44–1.00)
GFR, Estimated: >60 mL/min (ref >60)
Glucose, Bld: 92 mg/dL (ref 70–99)
Potassium: 3.7 mmol/L (ref 3.5–5.1)
Sodium: 139 mmol/L (ref 135–145)

## 2022-11-05 LAB — URINALYSIS, ROUTINE W REFLEX MICROSCOPIC
Bilirubin Urine: NEGATIVE
Glucose, UA: NEGATIVE mg/dL
Ketones, ur: NEGATIVE mg/dL
Leukocytes,Ua: NEGATIVE
Nitrite: NEGATIVE
Protein, ur: NEGATIVE mg/dL
Specific Gravity, Urine: 1.019 (ref 1.005–1.030)
pH: 5.5 (ref 5.0–8.0)

## 2022-11-05 LAB — PREGNANCY, URINE: Preg Test, Ur: NEGATIVE

## 2022-11-05 LAB — CBC
HCT: 29.2 % — ABNORMAL LOW (ref 36.0–46.0)
Hemoglobin: 9.5 g/dL — ABNORMAL LOW (ref 12.0–15.0)
MCH: 27.3 pg (ref 26.0–34.0)
MCHC: 32.5 g/dL (ref 30.0–36.0)
MCV: 83.9 fL (ref 80.0–100.0)
Platelets: 293 10*3/uL (ref 150–400)
RBC: 3.48 MIL/uL — ABNORMAL LOW (ref 3.87–5.11)
RDW: 13.9 % (ref 11.5–15.5)
WBC: 5.5 10*3/uL (ref 4.0–10.5)
nRBC: 0 % (ref 0.0–0.2)

## 2022-11-05 MED ORDER — IOHEXOL 300 MG/ML  SOLN
100.0000 mL | Freq: Once | INTRAMUSCULAR | Status: AC | PRN
  Administered 2022-11-05: 100 mL via INTRAVENOUS

## 2022-11-05 MED ORDER — ONDANSETRON HCL 4 MG/2ML IJ SOLN
4.0000 mg | Freq: Once | INTRAMUSCULAR | Status: AC
  Administered 2022-11-05: 4 mg via INTRAVENOUS
  Filled 2022-11-05: qty 2

## 2022-11-05 MED ORDER — KETOROLAC TROMETHAMINE 60 MG/2ML IM SOLN
30.0000 mg | Freq: Once | INTRAMUSCULAR | Status: AC
  Administered 2022-11-05: 30 mg via INTRAMUSCULAR
  Filled 2022-11-05: qty 2

## 2022-11-05 MED ORDER — MEGESTROL ACETATE 40 MG PO TABS: 40.0000 mg | ORAL_TABLET | Freq: Every day | ORAL | 0 refills | Status: DC

## 2022-11-05 MED ORDER — FENTANYL CITRATE PF 50 MCG/ML IJ SOSY
50.0000 ug | PREFILLED_SYRINGE | Freq: Once | INTRAMUSCULAR | Status: AC
  Administered 2022-11-05: 50 ug via INTRAVENOUS
  Filled 2022-11-05: qty 1

## 2022-11-05 NOTE — Discharge Instructions (Addendum)
Please follow-up outpatient with your OB/GYN, Dr. Rip Harbour.  Take Megace 40 mg daily for the next 10 days for heavy vaginal bleeding.  Return for worsening symptoms of anemia to include fatigue, shortness of breath, lightheadedness to the point where you feel like you are going to pass out.  Your hemoglobin today was 9.5 which is 2 points lower than it has been at baseline but is not enough for blood transfusion.  Recommend recheck of your hemoglobin outpatient with your OB/GYN to further evaluate.  Your imaging evaluation was unremarkable.

## 2022-11-05 NOTE — ED Provider Notes (Signed)
[facility] EMERGENC[facility] DEPT Provider Note   CSN: [id number] Arrival date & time: 11/05/22  2130     History  Chief Complaint  Patient presents with   Menorrhagia    Nicole [name] is a 46 y.o. female.  HPI   46 year old female with medical history significant for heavy menses and anemia status post blood transfusions, migraine headaches, uterine fibroids, CVA, rheumatoid arthritis who presents to the emergency department with heavy menstrual bleeding, lower abdominal pain, lightheadedness and fatigue.  The patient states that for the past 2 days she has been bleeding profusely.  She was lightheaded at work.  She endorses nausea and had an episode of NBNB emesis.  She endorses right sided pelvic pain.  She also endorses fatigue in the setting of her lightheadedness.  No syncope.  She states that she feels similar to when she has had to have blood transfusions in the past.  She also states that she has a history of uterine fibroids which occasionally cause her pain.  She follows outpatient with her gynecologist for this.  She endorses continued heavy vaginal bleeding.  She endorses increased urinary frequency, denies any dysuria.  Denies fevers or chills.  Home Medications Prior to Admission medications   Medication Sig Start Date End Date Taking? Authorizing Provider  megestrol (MEGACE) 40 MG tablet Take 1 tablet (40 mg total) by mouth daily for 10 days. 11/05/22 11/15/22 Yes Regan Lemming, MD  AIMOVIG 140 MG/ML SOAJ Inject 140 mg into the skin every 30 (thirty) days. 05/25/21   [provider]  baclofen (LIORESAL) 10 MG tablet Take 1 tablet (10 mg total) by mouth 3 (three) times daily. 12/28/21   Sharion Balloon, FNP  busPIRone (BUSPAR) 5 MG tablet Take 5 mg by mouth 2 (two) times daily. 02/17/21   [provider]  cetirizine (ZYRTEC) 10 MG tablet Take 10 mg by mouth daily.    [provider]  doxycycline (VIBRAMYCIN) 100 MG capsule Take 1 capsule  (100 mg total) by mouth 2 (two) times daily. 06/05/22   Kommor, [place], MD  EPINEPHrine 0.3 mg/0.3 mL IJ SOAJ injection Inject 0.3 mg into the muscle as needed for anaphylaxis (for allergic reaction).    [provider]  Fe Cbn-Fe Gluc-FA-B12-C-DSS (FERRALET 90) 90-1 MG TABS Take 1 tablet by mouth daily before breakfast. 01/08/19   Shelly Bombard, MD  fluticasone [facility]) 50 MCG/ACT nasal spray Place 2 sprays into both nostrils daily. 12/21/20   [provider]  levETIRAcetam (KEPPRA) 1000 MG tablet Take 1,000-2,000 mg by mouth See admin instructions. Take '1000mg'$  in the morning and '2000mg'$  at bedtime. 04/08/21   [provider]  methocarbamol (ROBAXIN) 500 MG tablet Take 1 tablet (500 mg total) by mouth 2 (two) times daily. 05/23/21   Lacretia Leigh, MD  metoprolol succinate (TOPROL-XL) 25 MG 24 hr tablet Take 25 mg by mouth daily. 05/23/21   [provider]  montelukast (SINGULAIR) 10 MG tablet Take 1 tablet (10 mg total) by mouth at bedtime. 07/03/19   Cirigliano, Garvin Fila, DO  oxyCODONE-acetaminophen (PERCOCET/ROXICET) 5-325 MG tablet Take 1 tablet by mouth every 4 (four) hours as needed for severe pain. 12/14/21   [name], MD  promethazine (PHENERGAN) 25 MG tablet Take 25 mg by mouth as needed for nausea or vomiting. 04/08/21   [provider]  SUMAtriptan (IMITREX) 100 MG tablet Take 100 mg by mouth every 2 (two) hours as needed for migraine. 04/08/21   [provider]  Vitamin D, Ergocalciferol, (DRISDOL) 1.25 MG (50000 UNIT) CAPS capsule Take 50,000 Units by mouth once a week. Sundays 06/07/21   [provider]  ALAYCEN 1/35 tablet TAKE 1 TABLET BY MOUTH EVERY DAY Patient not taking: No sig reported 08/07/19 06/23/21  Shelly Bombard, MD  famotidine (PEPCID) 20 MG tablet Take 1 tablet (20 mg total) by mouth daily. Patient not taking: No sig reported 11/13/19 06/23/21  Ok Edwards, PA-C  ferrous sulfate 325 (65 FE) MG tablet Take 1  tablet (325 mg total) by mouth daily. Patient not taking: No sig reported 12/29/18 06/23/21  Petrucelli, Samantha R, PA-C  ipratropium (ATROVENT) 0.06 % nasal spray Place 2 sprays into both nostrils 4 (four) times daily. Patient not taking: No sig reported 11/13/19 06/23/21  Ok Edwards, PA-C  medroxyPROGESTERone (DEPO-PROVERA) 150 MG/ML injection INJECT 1 ML (150 MG TOTAL) INTO THE MUSCLE EVERY 3 (THREE) MONTHS. Patient not taking: No sig reported 03/03/20 06/23/21  Shelly Bombard, MD  topiramate (TOPAMAX) 25 MG tablet Take 3 tablets (75 mg total) by mouth 2 (two) times daily. Patient not taking: No sig reported 07/07/18 06/23/21  Molpus, Jenny Reichmann, MD      Allergies    Green tea leaf ext, Penicillins, Shellfish allergy, Shrimp extract allergy skin test, Sulfa antibiotics, Tea, and Sulfamethoxazole    Review of Systems   Review of Systems  Gastrointestinal:  Positive for nausea and vomiting.  Genitourinary:  Positive for frequency, pelvic pain and vaginal bleeding. Negative for dysuria.  All other systems reviewed and are negative.   Physical Exam Updated Vital Signs BP (!) 149/98   Pulse (!) 59   Temp 98.2 F (36.8 C) (Oral)   Resp 16   LMP 11/01/2022   SpO2 99%  Physical Exam Vitals and nursing note reviewed. Exam conducted with a chaperone present.  Constitutional:      General: She is not in acute distress.    Appearance: She is well-developed. She is obese.  HENT:     Head: Normocephalic and atraumatic.  Eyes:     Conjunctiva/sclera: Conjunctivae normal.  Cardiovascular:     Rate and Rhythm: Normal rate and regular rhythm.     Heart sounds: No murmur heard. Pulmonary:     Effort: Pulmonary effort is normal. No respiratory distress.     Breath sounds: Normal breath sounds.  Abdominal:     Palpations: Abdomen is soft.     Tenderness: There is abdominal tenderness in the right lower quadrant, suprapubic area and left lower quadrant. There is no guarding or rebound.   Genitourinary:    Cervix: Cervical bleeding present. No cervical motion tenderness.     Adnexa: Left adnexa normal.       Right: Tenderness present.      Comments: Right-sided adnexal tenderness present, active mild bleeding present from the cervical os Musculoskeletal:        General: No swelling.     Cervical back: Neck supple.  Skin:    General: Skin is warm and dry.     Capillary Refill: Capillary refill takes less than 2 seconds.  Neurological:     Mental Status: She is alert.  Psychiatric:        Mood and Affect: Mood normal.     ED Results / Procedures / Treatments   Labs (all labs ordered are listed, but only abnormal results are displayed) Labs Reviewed  BASIC METABOLIC PANEL - Abnormal; Notable for the following components:      Result  Value   Calcium 8.6 (*)    All other components within normal limits  CBC - Abnormal; Notable for the following components:   RBC 3.48 (*)    Hemoglobin 9.5 (*)    HCT 29.2 (*)    All other components within normal limits  URINALYSIS, ROUTINE W REFLEX MICROSCOPIC - Abnormal; Notable for the following components:   Hgb urine dipstick LARGE (*)    All other components within normal limits  PREGNANCY, URINE    EKG EKG Interpretation  Date/Time:  Sunday November 05 2022 08:06:31 EST Ventricular Rate:  62 PR Interval:  178 QRS Duration: 92 QT Interval:  413 QTC Calculation: 420 R Axis:   36 Text Interpretation: Sinus rhythm Confirmed by Regan Lemming ([id number]) on 11/05/2022 8:15:49 AM  Radiology CT ABDOMEN PELVIS W CONTRAST  Result Date: 11/05/2022 CLINICAL DATA:  Right lower quadrant abdominal pain EXAM: CT ABDOMEN AND PELVIS WITH CONTRAST TECHNIQUE: Multidetector CT imaging of the abdomen and pelvis was performed using the standard protocol following bolus administration of intravenous contrast. RADIATION DOSE REDUCTION: This exam was performed according to the departmental dose-optimization program which includes automated  exposure control, adjustment of the mA and/or kV according to patient size and/or use of iterative reconstruction technique. CONTRAST:  170m OMNIPAQUE IOHEXOL 300 MG/ML  SOLN COMPARISON:  CT abdomen and pelvis dated March 05, 2015 FINDINGS: Lower chest: No acute abnormality. Hepatobiliary: No focal liver abnormality is seen. No gallstones, gallbladder wall thickening, or biliary dilatation. Pancreas: Unremarkable. No pancreatic ductal dilatation or surrounding inflammatory changes. Spleen: Normal in size without focal abnormality. Adrenals/Urinary Tract: Adrenal glands are unremarkable. Kidneys are normal, without renal calculi, focal lesion, or hydronephrosis. Bladder is unremarkable. Stomach/Bowel: Stomach is within normal limits. Appendix appears normal. No evidence of bowel wall thickening, distention, or inflammatory changes. Vascular/Lymphatic: No significant vascular findings are present. No enlarged abdominal or pelvic lymph nodes. Enlarged bilateral inguinal lymph nodes. Reference right inguinal lymph node measuring 1.5 cm in short axis on series 8, image 96. Reproductive: Uterus and bilateral adnexa are unremarkable. Other: No abdominal wall hernia or abnormality. No abdominopelvic ascites. Musculoskeletal: No acute or significant osseous findings. IMPRESSION: 1. No acute findings in the abdomen or pelvis. 2. Normal appendix visualized. 3. Enlarged bilateral inguinal lymph nodes, likely reactive. Electronically Signed   By: LYetta GlassmanM.D.   On: 11/05/2022 13:27   [place]LV[place] COMPLETE W TRANSVAGINAL AND TORSION R/O  Result Date: 11/05/2022 CLINICAL DATA:  Menorrhagia. EXAM: TRANSABDOMINAL AND TRANSVAGINAL ULTRASOUND OF PELVIS DOPPLER ULTRASOUND OF OVARIES TECHNIQUE: Both transabdominal and transvaginal ultrasound examinations of the pelvis were performed. Transabdominal technique was performed for global imaging of the pelvis including uterus, ovaries, adnexal regions, and pelvic cul-de-sac. It  was necessary to proceed with endovaginal exam following the transabdominal exam to visualize the endometrium and ovaries. Color and duplex Doppler ultrasound was utilized to evaluate blood flow to the ovaries. COMPARISON:  January 15, 2019. FINDINGS: Uterus Measurements: 9.8 x 6.2 x 5.3 cm = volume: 169 mL. 2.1 cm fibroid is noted anteriorly. Endometrium Thickness: 7 mm which is within normal limits. No focal abnormality visualized. Right ovary Not visualized. Left ovary Measurements: 3.1 x 2.6 x 1.8 cm = volume: 8 mL. Normal appearance/no adnexal mass. Pulsed Doppler evaluation of left ovary demonstrates normal low-resistance arterial and venous waveforms. Other findings Trace free fluid is noted which most likely is physiologic. IMPRESSION: Right ovary is not visualized. No evidence of left ovarian mass or torsion. 2.1 cm uterine fibroid is noted.  No other abnormality is noted. Electronically Signed   By: Marijo Conception M.D.   On: 11/05/2022 11:02    Procedures Procedures    Medications Ordered in ED Medications  ondansetron (ZOFRAN) injection 4 mg (4 mg Intravenous Given 11/05/22 0922)  fentaNYL (SUBLIMAZE) injection 50 mcg (50 mcg Intravenous Given 11/05/22 5170)  ketorolac (TORADOL) injection 30 mg (30 mg Intramuscular Given 11/05/22 1209)  iohexol (OMNIPAQUE) 300 MG/ML solution 100 mL (100 mLs Intravenous Contrast Given 11/05/22 1305)    ED Course/ Medical Decision Making/ A&P                           Medical Decision Making Amount and/or Complexity of Data Reviewed Labs: ordered. Radiology: ordered.  Risk Prescription drug management.     46 year old female with medical history significant for heavy menses and anemia status post blood transfusions, migraine headaches, uterine fibroids, CVA, rheumatoid arthritis who presents to the emergency department with heavy menstrual bleeding, lower abdominal pain, lightheadedness and fatigue.  The patient states that for the past 2 days she  has been bleeding profusely.  She was lightheaded at work.  She endorses nausea and had an episode of NBNB emesis.  She endorses right sided pelvic pain.  She also endorses fatigue in the setting of her lightheadedness.  No syncope.  She states that she feels similar to when she has had to have blood transfusions in the past.  She also states that she has a history of uterine fibroids which occasionally cause her pain.  She follows outpatient with her gynecologist for this.  She endorses continued heavy vaginal bleeding.  She endorses increased urinary frequency, denies any dysuria.  Denies fevers or chills.  On arrival, the patient was vitally stable, afebrile, not tachycardic P68, BP 139/105, RR 14, saturations 100% on room air.  Physical exam significant for bilateral lower abdominal tenderness, pelvic exam significant for right-sided adnexal tenderness, mild active bleeding from the cervical os..  Differential diagnosis includes ovarian torsion, tubo-ovarian abscess, symptomatic anemia from menorrhagia, appendicitis.   The patient's urine pregnancy was negative, CBC was without a leukocytosis, WBC 5.5, her hemoglobin revealed a two-point drop with a hemoglobin of 9.5 down from an average between 11 and 11.5.  Urinalysis revealed large blood, negative UTI, BMP was unremarkable.  Pelvic ultrasound was performed which revealed the following: IMPRESSION:  Right ovary is not visualized.    No evidence of left ovarian mass or torsion.    2.1 cm uterine fibroid is noted.    No other abnormality is noted.    Due to the nonvisualization of the right ovary, significant right lower quadrant tenderness, I discussed the risk and benefits of CT imaging and the patient decided to consent for CT imaging which was ordered.  Discussed the patient's care with on-call gynecology who recommended treatment with megace and outpatient GYN follow-up.   CT Imaging resulted negative. Discussed results and plan of care  with the patient. Plan for outpatient GYN follow-up, recheck of Hgb, return precautions provided.    Final Clinical Impression(s) / ED Diagnoses Final diagnoses:  Right lower quadrant abdominal pain  Pelvic pain  Vaginal bleeding  Symptomatic anemia  Leiomyoma of body of uterus    Rx / DC Orders ED Discharge Orders          Ordered    megestrol (MEGACE) 40 MG tablet  Daily        11/05/22 1443  Regan Lemming, MD 11/05/22 [phone]

## 2022-11-05 NOTE — ED Triage Notes (Signed)
Pt has hx of heavy menses, has had iron infusion in the past, needs a partial hysterectomy per her MD,not scheduled yet. She has been bleeding profusely and today was lightheaded at work .

## 2022-11-05 NOTE — ED Notes (Signed)
She returns from u/s at this time.

## 2022-11-10 ENCOUNTER — Encounter: Payer: Self-pay | Admitting: Obstetrics and Gynecology

## 2022-11-10 ENCOUNTER — Ambulatory Visit (INDEPENDENT_AMBULATORY_CARE_PROVIDER_SITE_OTHER): Payer: 59 | Admitting: Obstetrics and Gynecology

## 2022-11-10 ENCOUNTER — Other Ambulatory Visit (HOSPITAL_COMMUNITY)
Admission: RE | Admit: 2022-11-10 | Discharge: 2022-11-10 | Disposition: A | Discharge status: Home / Self Care | Payer: 59 | Source: Ambulatory Visit | Attending: Obstetrics and Gynecology | Admitting: Obstetrics and Gynecology

## 2022-11-10 VITALS — BP 135/89 | HR 77 | Ht 65.0 in | Wt 274.0 lb

## 2022-11-10 DIAGNOSIS — Z202 Contact with and (suspected) exposure to infections with a predominantly sexual mode of transmission: Secondary | ICD-10-CM | POA: Insufficient documentation

## 2022-11-10 DIAGNOSIS — D259 Leiomyoma of uterus, unspecified: Secondary | ICD-10-CM

## 2022-11-10 DIAGNOSIS — N920 Excessive and frequent menstruation with regular cycle: Secondary | ICD-10-CM | POA: Diagnosis not present

## 2022-11-10 DIAGNOSIS — Z01411 Encounter for gynecological examination (general) (routine) with abnormal findings: Secondary | ICD-10-CM

## 2022-11-10 MED ORDER — MEGESTROL ACETATE 40 MG PO TABS: 40.0000 mg | ORAL_TABLET | Freq: Every day | ORAL | 5 refills | Status: DC

## 2022-11-10 NOTE — Patient Instructions (Addendum)
Uterine Fibroids  Uterine fibroids are lumps of tissue (tumors) in the womb (uterus). Fibroids are not cancerous. Most women with this condition do not need treatment. Sometimes, fibroids can make it harder to have children. If this happens, you may need surgery to take out the fibroids. What are the causes? The cause of this condition is not known. What increases the risk? You are in your 30s or 40s and have not gone through menopause. Menopause is when you have not had a menstrual period for 12 months. Having a history of fibroids in your family. You are of African American descent. You started your period at age 48 or younger. You have not given birth. You are overweight or very overweight. What are the signs or symptoms? Bleeding between menstrual periods. Heavy bleeding during your menstrual period. Pain in the area between your hips. Needing to pee (urinate) right away or more often than usual. Not being able to have children (infertility). Not being able to stay pregnant (miscarriage). Many women do not have symptoms.  How is this treated? Treatment may include: Follow-up visits with your doctor to check your fibroids for any changes. Medicines to help with pain, such as aspirin or ibuprofen. Hormone therapy. This may be given as a pill, in a shot, or with a type of birth control device called an IUD. Surgery that would do one of these things: Take out the fibroids. This may be done if you want to become pregnant. Take out the womb (hysterectomy). Stop the blood flow to the fibroids. Follow these instructions at home: Medicines Take over-the-counter and prescription medicines only as told by your doctor. Ask your doctor if you should: Take iron pills. Eat more foods that have a lot of iron in them, such as Fees green, leafy vegetables. Managing pain If told, put heat on your back or belly. Do this as often as told by your doctor. Use the heat source that your doctor  recommends, such as a moist heat pack or a heating pad. To do this: Put a towel between your skin and the heat pack or pad. Leave the heat on for 20-30 minutes. Take off the heat if your skin turns bright red. This is very important. If you cannot feel pain, heat, or cold, you may have a greater risk of getting burned.  General instructions Tell your doctor about any changes to your menstrual period, such as: Heavy bleeding that needs a change of tampons or pads more than normal. A change in how many days your period lasts. A change in symptoms that come with your period. This might be belly cramps or back pain. Keep all follow-up visits. Contact a doctor if: You have pain that does not get better with medicine or heat. This may include pain or cramps in: The area between your hip bones. Your back. Your belly. You have new bleeding between your periods. You have more bleeding during or between your periods. You feel very tired or weak. You feel dizzy. Get help right away if: You faint. You have pain in the area between your hip bones that gets worse. You have bleeding that soaks a tampon or pad in 30 minutes or less. Summary Uterine fibroids are lumps of tissue (tumors) in your womb. They are not cancerous. Medicines such as aspirin or ibuprofen may be used to help with pain. Contact a doctor if you have pain or cramps that do not get better with medicine. Know the symptoms for when you  should get help right away. This information is not intended to replace advice given to you by your health care provider. Make sure you discuss any questions you have with your health care provider. Document Revised: 06/29/2020 Document Reviewed: 06/29/2020 Elsevier Patient Education  [place]. Vaginal Hysterectomy, Care After The following information offers guidance on how to care for yourself after your procedure. Your health care provider may also give you more specific instructions. If  you have problems or questions, contact your health care provider. What can I expect after the procedure? After the procedure, it is common to have: Pain in the lower abdomen and vagina. Vaginal bleeding and discharge for up to 1 week. You will need to use a sanitary pad after this procedure. Difficulty having a bowel movement (constipation). Temporary problems emptying the bladder. Tiredness (fatigue). Poor appetite. Less interest in sex. Feelings of sadness or other emotions. If your ovaries were also removed, it is also common to have symptoms of menopause, such as hot flashes, night sweats, and lack of sleep (insomnia). Follow these instructions at home: Medicines  Take over-the-counter and prescription medicines only as told by your health care provider. Ask your health care provider if the medicine prescribed to you: Requires you to avoid driving or using machinery. Can cause constipation. You may need to take these actions to prevent or treat constipation: Drink enough fluid to keep your urine pale yellow. Take over-the-counter or prescription medicines. Eat foods that are high in fiber, such as beans, whole grains, and fresh fruits and vegetables. Limit foods that are high in fat and processed sugars, such as fried or sweet foods. Activity  Rest as told by your health care provider. Return to your normal activities as told by your health care provider. Ask your health care provider what activities are safe for you Avoid sitting for a long time without moving. Get up to take short walks every 1-2 hours. This is important to improve blood flow and breathing. Ask for help if you feel weak or unsteady. Try to have someone home with you for 1-2 weeks to help you with everyday chores. Do not lift anything that is heavier than 10 lb (4.5 kg), or the limit that you are told, until your health care provider says that it is safe. If you were given a sedative during the procedure, it can  affect you for several hours. Do not drive or operate machinery until your health care provider says that it is safe. Lifestyle Do not use any products that contain nicotine or tobacco. These products include cigarettes, chewing tobacco, and vaping devices, such as e-cigarettes. These can delay healing after surgery. If you need help quitting, ask your health care provider. Do not drink alcohol until your health care provider approves. General instructions Do not douche, use tampons, or have sex for at least 6 weeks, or as told by your health care provider. If you struggle with physical or emotional changes after your procedure, speak with your health care provider or a therapist. The stitches inside your vagina will dissolve over time and do not need to be taken out. Do not take baths, swim, or use a hot tub until your health care provider approves. You may only be allowed to take showers for 2-3 weeks Wear compression stockings as told by your health care provider. These stockings help to prevent blood clots and reduce swelling in your legs. Keep all follow-up visits. This is important. Contact a health care provider if:  Your pain medicine is not helping. You have a fever. You have nausea or vomiting that does not go away. You feel dizzy. You have blood, pus, or a bad-smelling discharge from your vagina more than 1 week after the procedure. You continue to have trouble urinating 3-5 days after the procedure. Get help right away if: You have severe pain in your abdomen or back. You faint. You have heavy vaginal bleeding and blood clots, soaking through a sanitary pad in less than 1 hour. You have chest pain or shortness of breath. You have pain, swelling, or redness in your leg. These symptoms may represent a serious problem that is an emergency. Do not wait to see if the symptoms will go away. Get medical help right away. Call your local emergency services (911 in the [place] Do not drive  yourself to the hospital. Summary After the procedure, it is common to have pain, vaginal bleeding, constipation, temporary problems emptying your bladder, and feelings of sadness or other emotions. Take over-the-counter and prescription medicines only as told by your health care provider. Rest as told by your health care provider. Return to your normal activities as told by your health care provider. Contact a health care provider if your pain medicine is not helping, or you have a fever, dizziness, or trouble urinating several days after the procedure. Get help right away if you have severe pain in your abdomen or back, or if you faint, have heavy bleeding, or have chest pain or shortness of breath. This information is not intended to replace advice given to you by your health care provider. Make sure you discuss any questions you have with your health care provider. Document Revised: 02/08/2022 Document Reviewed: 07/30/2020 Elsevier Patient Education  [place]. Vaginal Hysterectomy  A vaginal hysterectomy is a procedure to remove all or part of the uterus through a small incision in the vagina. In this procedure, your health care provider may remove your entire uterus, including the cervix. The cervix is the opening and bottom part of the uterus and is located between the vagina and the uterus. Sometimes, the ovaries and fallopian tubes are also removed. This surgery may be done to treat problems such as: Noncancerous growths in the uterus (uterine fibroids) that cause symptoms. A condition that causes the lining of the uterus to grow in other areas (endometriosis). Problems with pelvic support. Cancer of the cervix, ovaries, uterus, or tissue that lines the uterus (endometrium). Excessive bleeding in the uterus. When removing your uterus, your health care provider may also remove the ovaries and the fallopian tubes. After this procedure, you will no longer be able to have a baby, and  you will no longer have a menstrual period. Tell a health care provider about: Any allergies you have. All medicines you are taking, including vitamins, herbs, eye drops, creams, and over-the-counter medicines. Any problems you or family members have had with anesthetic medicines. Any blood disorders you have. Any surgeries you have had. Any medical conditions you have. Whether you are pregnant or may be pregnant. What are the risks? Generally, this is a safe procedure. However, problems may occur, including: Bleeding. Infection. Blood clots in the legs or lungs. Damage to nearby structures or organs. Pain during sex. Allergic reactions to medicines. What happens before the procedure? Staying hydrated Follow instructions from your health care provider about hydration, which may include: Up to 2 hours before the procedure - you may continue to drink clear liquids, such as water, clear  fruit juice, black coffee, and plain tea.  Eating and drinking restrictions Follow instructions from your health care provider about eating and drinking, which may include: 8 hours before the procedure - stop eating heavy meals or foods, such as meat, fried foods, or fatty foods. 6 hours before the procedure - stop eating light meals or foods, such as toast or cereal. 6 hours before the procedure - stop drinking milk or drinks that contain milk. 2 hours before the procedure - stop drinking clear liquids. Medicines Take over-the-counter and prescription medicines only as told by your health care provider. You may be asked to take a medicine to empty your colon (bowel preparation). General instructions If you were asked to do a bowel preparation before the procedure, follow instructions from your health care provider. This procedure can affect the way you feel about yourself. Talk with your health care provider about the physical and emotional changes hysterectomy may cause. Do not use any products that  contain nicotine or tobacco for at least 4 weeks before the procedure. These products include cigarettes, e-cigarettes, and chewing tobacco. If you need help quitting, ask your health care provider. Plan to have a responsible adult take you home from the hospital or clinic. Plan to have a responsible adult care for you for the time you are told after you leave the hospital or clinic. This is important. Surgery safety Ask your health care provider: How your surgery site will be marked. What steps will be taken to help prevent infection. These may include: Removing hair at the surgery site. Washing skin with a germ-killing soap. Receiving antibiotic medicine. What happens during the procedure? An IV will be inserted into one of your veins. You will be given one or more of the following: A medicine to help you relax (sedative). A medicine to numb the area (local anesthetic). A medicine to make you fall asleep (general anesthetic). A medicine that is injected into your spine to numb the area below and slightly above the injection site (spinal anesthetic). A medicine that is injected into an area of your body to numb everything below the injection site (regional anesthetic). Your surgeon will make an incision in your vagina. Your surgeon will locate and remove all or part of your uterus. Part or all of the uterus will be removed through the vagina. Your ovaries and fallopian tubes may be removed at the same time. The incision in your vagina will be closed with stitches (sutures) that dissolve over time. The procedure may vary among health care providers and hospitals. What happens after the procedure? Your blood pressure, heart rate, breathing rate, and blood oxygen level will be monitored until you leave the hospital or clinic. You will be encouraged to walk as soon as possible. You will also use a device or do breathing exercises to keep your lungs clear. You may have to wear compression  stockings. These stockings help to prevent blood clots and reduce swelling in your legs. You will be given pain medicine as needed. You will need to wear a sanitary pad for vaginal discharge or bleeding. Summary A vaginal hysterectomy is a procedure to remove all or part of the uterus through the vagina. You may need a vaginal hysterectomy to treat a variety of abnormalities of the uterus. Plan to have a responsible adult take you home from the hospital or clinic. Plan to have a responsible adult care for you for the time you are told after you leave the hospital or clinic.  This is important. This information is not intended to replace advice given to you by your health care provider. Make sure you discuss any questions you have with your health care provider. Document Revised: 02/08/2022 Document Reviewed: 07/30/2020 Elsevier Patient Education  [place].

## 2022-11-10 NOTE — Progress Notes (Signed)
Nicole [name] is a 46 y.o. [phone] female here for a routine annual gynecologic exam.  Current complaints: [facility].   See prior office notes. Seen in ER for same. Megace has stopped the bleeding. GYN U/S uterine forbid, 169 gms Desires definite therpay  H/O BTL H/O TSVD x 2 ( 10 # 8 oz)    Gynecologic History Patient's last menstrual period was 11/01/2022. Contraception: tubal ligation  Obstetric History OB History  Gravida Para Term Preterm AB Living  '3 2 1 1 1 2  '$ SAB IAB Ectopic Multiple Live Births  0 0 1 0 2    # Outcome Date GA Lbr Len/2nd Weight Sex Delivery Anes PTL Lv  3 Preterm 12/26/03    M Vag-Spont   LIV     Birth Comments: 22 wks, IOL for fetal distress  2 Term 05/17/95    F Vag-Spont  Y LIV  1 Ectopic             Past Medical History:  Diagnosis Date   Anemia    Asthma    Atypical chest pain    a. ?pericarditis 2015.   Blood transfusion 2011   r/t anemia   Fibroid    Migraine    Rheumatoid arthritis ([place])    Seizures ([facility])    Sickle cell trait ([place])    Stroke ([place])    Tooth abscess     Past Surgical History:  Procedure Laterality Date   TUBAL LIGATION Bilateral 2005    Current Outpatient Medications on File Prior to Visit  Medication Sig Dispense Refill   AIMOVIG 140 MG/ML SOAJ Inject 140 mg into the skin every 30 (thirty) days.     baclofen (LIORESAL) 10 MG tablet Take 1 tablet (10 mg total) by mouth 3 (three) times daily. 30 each 0   busPIRone (BUSPAR) 5 MG tablet Take 5 mg by mouth 2 (two) times daily.     cetirizine (ZYRTEC) 10 MG tablet Take 10 mg by mouth daily.     EPINEPHrine 0.3 mg/0.3 mL IJ SOAJ injection Inject 0.3 mg into the muscle as needed for anaphylaxis (for allergic reaction).     Fe Cbn-Fe Gluc-FA-B12-C-DSS (FERRALET 90) 90-1 MG TABS Take 1 tablet by mouth daily before breakfast. 30 each 11   fluticasone (FLONASE) 50 MCG/ACT nasal spray Place 2 sprays into both nostrils daily.     levETIRAcetam (KEPPRA) 1000 MG tablet Take  1,000-2,000 mg by mouth See admin instructions. Take '1000mg'$  in the morning and '2000mg'$  at bedtime.     methocarbamol (ROBAXIN) 500 MG tablet Take 1 tablet (500 mg total) by mouth 2 (two) times daily. 20 tablet 0   metoprolol succinate (TOPROL-XL) 25 MG 24 hr tablet Take 25 mg by mouth daily.     montelukast (SINGULAIR) 10 MG tablet Take 1 tablet (10 mg total) by mouth at bedtime. 90 tablet 3   promethazine (PHENERGAN) 25 MG tablet Take 25 mg by mouth as needed for nausea or vomiting.     SUMAtriptan (IMITREX) 100 MG tablet Take 100 mg by mouth every 2 (two) hours as needed for migraine.     Vitamin D, Ergocalciferol, (DRISDOL) 1.25 MG (50000 UNIT) CAPS capsule Take 50,000 Units by mouth once a week. Sundays     [DISCONTINUED] ALAYCEN 1/35 tablet TAKE 1 TABLET BY MOUTH EVERY DAY (Patient not taking: No sig reported) 28 tablet 2   [DISCONTINUED] famotidine (PEPCID) 20 MG tablet Take 1 tablet (20 mg total) by mouth daily. (Patient not taking:  No sig reported) 15 tablet 0   [DISCONTINUED] ferrous sulfate 325 (65 FE) MG tablet Take 1 tablet (325 mg total) by mouth daily. (Patient not taking: No sig reported) 30 tablet 0   [DISCONTINUED] ipratropium (ATROVENT) 0.06 % nasal spray Place 2 sprays into both nostrils 4 (four) times daily. (Patient not taking: No sig reported) 15 mL 0   [DISCONTINUED] medroxyPROGESTERone (DEPO-PROVERA) 150 MG/ML injection INJECT 1 ML (150 MG TOTAL) INTO THE MUSCLE EVERY 3 (THREE) MONTHS. (Patient not taking: No sig reported) 1 mL 4   [DISCONTINUED] topiramate (TOPAMAX) 25 MG tablet Take 3 tablets (75 mg total) by mouth 2 (two) times daily. (Patient not taking: No sig reported) 180 tablet 0   No current facility-administered medications on file prior to visit.    Allergies  Allergen Reactions   Green Tea Leaf Ext Other (See Comments)    All teas   Penicillins Anaphylaxis, Swelling and Rash    Has patient had a PCN reaction causing immediate rash, facial/tongue/throat  swelling, SOB or lightheadedness with hypotension: Yes Has patient had a PCN reaction causing severe rash involving mucus membranes or skin necrosis: Yes Has patient had a PCN reaction that required hospitalization Yes- ed visit Has patient had a PCN reaction occurring within the last 10 years: No-childhood allergy If all of the above answers are "NO", then may proceed with Cephalosporin use.  Throat Swelling Throat Swelling Has patient had a PCN reaction causing immediate rash, facial/tongue/throat swelling, SOB or lightheadedness with hypotension: Yes Has patient had a PCN reaction causing severe rash involving mucus membranes or skin necrosis: Yes Has patient had a PCN reaction that required hospitalization Yes- ed visit Has patient had a PCN reaction occurring within the last 10 years: No-childhood allergy If all of the above answers are "NO", then may proceed with Cephalosporin use.  Throat Swelling   Shellfish Allergy Anaphylaxis   Shrimp Extract Allergy Skin Test Other (See Comments)   Sulfa Antibiotics Anaphylaxis, Swelling and Rash   Tea Anaphylaxis    All teas   Sulfamethoxazole Swelling, Rash and Other (See Comments)    Throat Swelling    Social History   Socioeconomic History   Marital status: Married    Spouse name: Not on file   Number of children: Not on file   Years of education: Not on file   Highest education level: Not on file  Occupational History   Not on file  Tobacco Use   Smoking status: Former    Types: Cigarettes    Quit date: 03/27/2014    Years since quitting: 8.6    Passive exposure: Past   Smokeless tobacco: Never  Vaping Use   Vaping Use: Never used  Substance and Sexual Activity   Alcohol use: No   Drug use: No   Sexual activity: Yes    Partners: Male    Birth control/protection: Condom, Injection, Surgical  Other Topics Concern   Not on file  Social History Narrative   Not on file   Social Determinants of Health   Financial  Resource Strain: Not on file  Food Insecurity: Not on file  Transportation Needs: No Transportation Needs (04/26/2021)   PRAPARE - Hydrologist (Medical): No    Lack of Transportation (Non-Medical): No  Physical Activity: Not on file  Stress: Not on file  Social Connections: Not on file  Intimate Partner Violence: Not on file    Family History  Problem Relation Age of Onset  Hypertension Mother    Diabetes Mother    Cancer Mother        Colon, brain, 2 other   Asthma Mother    Clotting disorder Mother    Heart disease Mother        96, stents   Stroke Mother        2012   Breast cancer Mother 68   Diabetes Father    Asthma Father    Clotting disorder Father    Sickle cell anemia Father    Heart disease Father        2000, stents   Hypertension Father    Stroke Father        2007   Diabetes Maternal Grandmother    Breast cancer Maternal Grandmother    Asthma Sister    Asthma Brother    Asthma Paternal Grandmother    Breast cancer Maternal Grandfather    Anesthesia problems Neg Hx     The following portions of the patient's history were reviewed and updated as appropriate: allergies, current medications, past family history, past medical history, past social history, past surgical history and problem list.  Review of Systems Pertinent items noted in HPI and remainder of comprehensive ROS otherwise negative.   Objective:  BP 135/89   Pulse 77   Ht '5\' 5"'$  (1.651 m)   Wt 274 lb (124.3 kg)   LMP 11/01/2022   BMI 45.60 kg/m  CONSTITUTIONAL: Well-developed, well-nourished female in no acute distress.  HENT:  Normocephalic, atraumatic, External right and left ear normal. Oropharynx is clear and moist EYES: Conjunctivae and EOM are normal. Pupils are equal, round, and reactive to light. No scleral icterus.  NECK: Normal range of motion, supple, no masses.  Normal thyroid.  SKIN: Skin is warm and dry. No rash noted. Not diaphoretic. No  erythema. No pallor. [place]: Alert and oriented to person, place, and time. Normal reflexes, muscle tone coordination. No cranial nerve deficit noted. PSYCHIATRIC: Normal mood and affect. Normal behavior. Normal judgment and thought content. CARDIOVASCULAR: Normal heart rate noted, regular rhythm RESPIRATORY: Clear to auscultation bilaterally. Effort and breath sounds normal, no problems with respiration noted. BREASTS: Symmetric in size. No masses, skin changes, nipple drainage, or lymphadenopathy. ABDOMEN: Soft, normal bowel sounds, no distention noted.  No tenderness, rebound or guarding.  PELVIC: Normal appearing external genitalia; normal appearing vaginal mucosa and cervix.  No abnormal discharge noted.  Pap smear obtained.  Normal uterine size, no other palpable masses, no uterine or adnexal tenderness. MUSCULOSKELETAL: Normal range of motion. No tenderness.  No cyanosis, clubbing, or edema.  2+ distal pulses.   Assessment:  Annual gynecologic examination with pap smear STD testing Uterine fibroids Menorrhagia  Plan:  Will follow up results of pap smear and manage accordingly. Mammogram scheduled. Tx options for menorrhagia reviewed. Pt desires definite therapy. TVH with possible salpingectomy reviewed with pt R/B/Post op care discussed. Information provided as well. Pt desires to proceed. Megace qd until surgery F/U with post op appt Routine preventative health maintenance measures emphasized. Please refer to After Visit Summary for other counseling recommendations.    [name], MD, [place] Attending [place] for [facility], [place]

## 2022-11-10 NOTE — Progress Notes (Signed)
46 y.o GYN presents for AEX/PAP/ED FU.  C/o heavy vaginal bleeding, cramps 9/10; however it is lighter now since she taking Megace x 1+ years.

## 2022-11-13 ENCOUNTER — Other Ambulatory Visit: Payer: 59

## 2022-11-13 DIAGNOSIS — Z113 Encounter for screening for infections with a predominantly sexual mode of transmission: Secondary | ICD-10-CM

## 2022-11-13 LAB — CERVICOVAGINAL ANCILLARY ONLY
Bacterial Vaginitis (gardnerella): NEGATIVE
Candida Glabrata: NEGATIVE
Candida Vaginitis: NEGATIVE
Chlamydia: NEGATIVE
Comment: NEGATIVE
Comment: NEGATIVE
Comment: NEGATIVE
Comment: NEGATIVE
Comment: NEGATIVE
Comment: NORMAL
Neisseria Gonorrhea: NEGATIVE
Trichomonas: NEGATIVE

## 2022-11-15 ENCOUNTER — Telehealth: Payer: Self-pay | Admitting: *Deleted

## 2022-11-15 ENCOUNTER — Other Ambulatory Visit: Payer: Self-pay | Admitting: Obstetrics and Gynecology

## 2022-11-15 DIAGNOSIS — D259 Leiomyoma of uterus, unspecified: Secondary | ICD-10-CM

## 2022-11-15 LAB — CYTOLOGY - PAP
Comment: NEGATIVE
Diagnosis: NEGATIVE
Diagnosis: REACTIVE
High risk HPV: NEGATIVE

## 2022-11-15 MED ORDER — TRAMADOL HCL 50 MG PO TABS: 50.0000 mg | ORAL_TABLET | Freq: Four times a day (QID) | ORAL | 0 refills | Status: DC | PRN

## 2022-11-15 NOTE — Telephone Encounter (Signed)
TC from pt reporting severe pain from previously DX fibroids. Pt saw Dr. Rip Harbour on 11/10/22 and TVH is planned. Secure message sent to Dr. Rip Harbour on pt's behalf. RX Tramadol sent in by Dr. Rip Harbour.

## 2022-11-20 ENCOUNTER — Ambulatory Visit (INDEPENDENT_AMBULATORY_CARE_PROVIDER_SITE_OTHER): Payer: Self-pay | Admitting: Licensed Clinical Social Worker

## 2022-11-20 DIAGNOSIS — F320 Major depressive disorder, single episode, mild: Secondary | ICD-10-CM

## 2022-11-21 NOTE — BH Specialist Note (Signed)
Integrated Behavioral Health Initial In-Person Visit  MRN: [id number] Name: Nicole [name]  Number of Center Clinician visits: 1 Session Start time:   3:00pm Session End time: 3:30pm Total time in minutes: 30 mins in person at [facility]   Types of Service: Individual psychotherapy  Interpretor:No. Interpretor Name and Language: none    Warm Hand Off Completed.        Subjective: Nicole [name] is a 46 y.o. female accompanied by n/a Patient was referred by Dr. Rip Harbour  for grief. Patient reports the following symptoms/concerns: depressed mood and grief  Duration of problem: approx on year; Severity of problem: mild  Objective: Mood: Depressed and Affect: Appropriate Risk of harm to self or others: No plan to harm self or others  Life Context: Family and Social: Lives in [place]  School/Work: employed  Self-Care: sleep  Life Changes: grieving loss of several family members   Patient and/or Family's Strengths/Protective Factors: Concrete supports in place (healthy food, safe environments, etc.)  Goals Addressed: Patient will: Reduce symptoms of: depression Increase knowledge and/or ability of: coping skills  Demonstrate ability to: Begin healthy grieving over loss  Progress towards Goals: Ongoing  Interventions: Interventions utilized: Supportive Counseling  Standardized Assessments completed: Not Needed  Patient and/or Family Response: Nicole [name] reports depressed mood and feeling overwhelmed with loss of mother and siblings   Assessment: Patient currently experiencing depression.   Patient may benefit from community mental health.  Plan: Follow up with behavioral health clinician on : as needed  Behavioral recommendations: grief counseling Referral(s): [place] (In Clinic) "From scale of 1-10, how likely are you to follow plan?":    Lynnea Ferrier, LCSW

## 2022-12-06 ENCOUNTER — Encounter (HOSPITAL_BASED_OUTPATIENT_CLINIC_OR_DEPARTMENT_OTHER): Payer: Self-pay

## 2022-12-06 ENCOUNTER — Other Ambulatory Visit: Payer: Self-pay

## 2022-12-06 ENCOUNTER — Emergency Department (HOSPITAL_BASED_OUTPATIENT_CLINIC_OR_DEPARTMENT_OTHER): Payer: 59 | Admitting: Radiology

## 2022-12-06 ENCOUNTER — Emergency Department (HOSPITAL_BASED_OUTPATIENT_CLINIC_OR_DEPARTMENT_OTHER)
Admission: EM | Admit: 2022-12-06 | Discharge: 2022-12-06 | Disposition: A | Discharge status: Home / Self Care | Payer: 59 | Attending: Emergency Medicine | Admitting: Emergency Medicine

## 2022-12-06 DIAGNOSIS — B974 Respiratory syncytial virus as the cause of diseases classified elsewhere: Secondary | ICD-10-CM | POA: Insufficient documentation

## 2022-12-06 DIAGNOSIS — R0789 Other chest pain: Secondary | ICD-10-CM | POA: Diagnosis not present

## 2022-12-06 DIAGNOSIS — Z1152 Encounter for screening for COVID-19: Secondary | ICD-10-CM | POA: Insufficient documentation

## 2022-12-06 DIAGNOSIS — B338 Other specified viral diseases: Secondary | ICD-10-CM

## 2022-12-06 LAB — CBC
HCT: 32.9 % — ABNORMAL LOW (ref 36.0–46.0)
Hemoglobin: 10.7 g/dL — ABNORMAL LOW (ref 12.0–15.0)
MCH: 26.6 pg (ref 26.0–34.0)
MCHC: 32.5 g/dL (ref 30.0–36.0)
MCV: 81.6 fL (ref 80.0–100.0)
Platelets: 340 10*3/uL (ref 150–400)
RBC: 4.03 MIL/uL (ref 3.87–5.11)
RDW: 13.8 % (ref 11.5–15.5)
WBC: 7.1 10*3/uL (ref 4.0–10.5)
nRBC: 0 % (ref 0.0–0.2)

## 2022-12-06 LAB — RESP PANEL BY RT-PCR (RSV, FLU A&B, COVID)  RVPGX2
Influenza A by PCR: NEGATIVE
Influenza B by PCR: NEGATIVE
Resp Syncytial Virus by PCR: POSITIVE — AB
SARS Coronavirus 2 by RT PCR: NEGATIVE

## 2022-12-06 LAB — BASIC METABOLIC PANEL
Anion gap: 12 (ref 5–15)
BUN: 10 mg/dL (ref 6–20)
CO2: 22 mmol/L (ref 22–32)
Calcium: 9.4 mg/dL (ref 8.9–10.3)
Chloride: 105 mmol/L (ref 98–111)
Creatinine, Ser: 0.89 mg/dL (ref 0.44–1.00)
GFR, Estimated: >60 mL/min (ref >60)
Glucose, Bld: 95 mg/dL (ref 70–99)
Potassium: 3.7 mmol/L (ref 3.5–5.1)
Sodium: 139 mmol/L (ref 135–145)

## 2022-12-06 LAB — TROPONIN I (HIGH SENSITIVITY)
Troponin I (High Sensitivity): <2 ng/L (ref <18)
Troponin I (High Sensitivity): <2 ng/L (ref <18)

## 2022-12-06 LAB — PREGNANCY, URINE: Preg Test, Ur: NEGATIVE

## 2022-12-06 MED ORDER — SODIUM CHLORIDE 0.9 % IV BOLUS
500.0000 mL | Freq: Once | INTRAVENOUS | Status: AC
  Administered 2022-12-06: 500 mL via INTRAVENOUS

## 2022-12-06 MED ORDER — KETOROLAC TROMETHAMINE 30 MG/ML IJ SOLN
30.0000 mg | Freq: Once | INTRAMUSCULAR | Status: AC
  Administered 2022-12-06: 30 mg via INTRAVENOUS
  Filled 2022-12-06: qty 1

## 2022-12-06 MED ORDER — METOCLOPRAMIDE HCL 5 MG/ML IJ SOLN
10.0000 mg | Freq: Once | INTRAMUSCULAR | Status: DC
  Filled 2022-12-06: qty 2

## 2022-12-06 NOTE — Discharge Instructions (Signed)
You are seen in the emergency department for evaluation of of cough chest pain fever body aches.  Your flu and COVID test were negative but you did test positive for RSV.  The treatment for this is symptomatic which means Tylenol ibuprofen fluids rest.  Follow-up with your regular doctor.  Return to the emergency department if any worsening or concerning symptoms

## 2022-12-06 NOTE — ED Notes (Signed)
Pt given discharge instructions. Opportunities given for questions. Pt verbalizes understanding. Urbano Milhouse R, RN 

## 2022-12-06 NOTE — ED Triage Notes (Signed)
Pt states that she has had intermittent chest heaviness x 1 day that radiates into her left arm. Pt also reports nasal congestion, sore throat and body aches that began "when it started raining".

## 2022-12-06 NOTE — ED Provider Notes (Signed)
[facility] EMERGENCY DEPT Provider Note   CSN: [id number] Arrival date & time: 12/06/22  0507     History  Chief Complaint  Patient presents with   Chest Pain    Nicole [name] is a 46 y.o. female.  She started with head congestion body aches around 11 yesterday morning.  She was at her second job early this morning when she experienced some pain in her chest going down her left arm.  Minimal cough no nausea or vomiting.  No known fevers.  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  The history is provided by the patient.  Chest Pain Pain location:  Substernal area Pain quality: aching   Pain radiates to:  L arm Pain severity:  Moderate Onset quality:  Gradual Duration:  6 hours Timing:  Intermittent Progression:  Unchanged Chronicity:  New Relieved by:  None tried Worsened by:  Nothing Ineffective treatments:  None tried Associated symptoms: fatigue and headache   Associated symptoms: no abdominal pain, no cough, no diaphoresis, no fever, no nausea, no shortness of breath and no vomiting        Home Medications Prior to Admission medications   Medication Sig Start Date End Date Taking? Authorizing Provider  traMADol (ULTRAM) 50 MG tablet Take 1 tablet (50 mg total) by mouth every 6 (six) hours as needed. 11/15/22   [name], MD  AIMOVIG 140 MG/ML SOAJ Inject 140 mg into the skin every 30 (thirty) days. 05/25/21   [provider]  baclofen (LIORESAL) 10 MG tablet Take 1 tablet (10 mg total) by mouth 3 (three) times daily. 12/28/21   Sharion Balloon, FNP  busPIRone (BUSPAR) 5 MG tablet Take 5 mg by mouth 2 (two) times daily. 02/17/21   [provider]  cetirizine (ZYRTEC) 10 MG tablet Take 10 mg by mouth daily.    [provider]  EPINEPHrine 0.3 mg/0.3 mL IJ SOAJ injection Inject 0.3 mg into the muscle as needed for anaphylaxis (for allergic reaction).    [provider]  Fe Cbn-Fe Gluc-FA-B12-C-DSS (FERRALET  90) 90-1 MG TABS Take 1 tablet by mouth daily before breakfast. 01/08/19   Shelly Bombard, MD  fluticasone [facility]) 50 MCG/ACT nasal spray Place 2 sprays into both nostrils daily. 12/21/20   [provider]  levETIRAcetam (KEPPRA) 1000 MG tablet Take 1,000-2,000 mg by mouth See admin instructions. Take '1000mg'$  in the morning and '2000mg'$  at bedtime. 04/08/21   [provider]  megestrol (MEGACE) 40 MG tablet Take 1 tablet (40 mg total) by mouth daily. Can increase to two tablets twice a day in the event of heavy bleeding 11/10/22   [name], MD  methocarbamol (ROBAXIN) 500 MG tablet Take 1 tablet (500 mg total) by mouth 2 (two) times daily. 05/23/21   Lacretia Leigh, MD  metoprolol succinate (TOPROL-XL) 25 MG 24 hr tablet Take 25 mg by mouth daily. 05/23/21   [provider]  montelukast (SINGULAIR) 10 MG tablet Take 1 tablet (10 mg total) by mouth at bedtime. 07/03/19   Cirigliano, Garvin Fila, DO  promethazine (PHENERGAN) 25 MG tablet Take 25 mg by mouth as needed for nausea or vomiting. 04/08/21   [provider]  SUMAtriptan (IMITREX) 100 MG tablet Take 100 mg by mouth every 2 (two) hours as needed for migraine. 04/08/21   [provider]  Vitamin D, Ergocalciferol, (DRISDOL) 1.25 MG (50000 UNIT) CAPS capsule Take 50,000 Units by mouth once a week. Sundays 06/07/21  [provider]  ALAYCEN 1/35 tablet TAKE 1 TABLET BY MOUTH EVERY DAY Patient not taking: No sig reported 08/07/19 06/23/21  Shelly Bombard, MD  famotidine (PEPCID) 20 MG tablet Take 1 tablet (20 mg total) by mouth daily. Patient not taking: No sig reported 11/13/19 06/23/21  Ok Edwards, PA-C  ferrous sulfate 325 (65 FE) MG tablet Take 1 tablet (325 mg total) by mouth daily. Patient not taking: No sig reported 12/29/18 06/23/21  Petrucelli, Samantha R, PA-C  ipratropium (ATROVENT) 0.06 % nasal spray Place 2 sprays into both nostrils 4 (four) times daily. Patient not taking: No sig reported  11/13/19 06/23/21  Ok Edwards, PA-C  medroxyPROGESTERone (DEPO-PROVERA) 150 MG/ML injection INJECT 1 ML (150 MG TOTAL) INTO THE MUSCLE EVERY 3 (THREE) MONTHS. Patient not taking: No sig reported 03/03/20 06/23/21  Shelly Bombard, MD  topiramate (TOPAMAX) 25 MG tablet Take 3 tablets (75 mg total) by mouth 2 (two) times daily. Patient not taking: No sig reported 07/07/18 06/23/21  Molpus, Jenny Reichmann, MD      Allergies    Green tea leaf ext, Penicillins, Shellfish allergy, Shrimp extract allergy skin test, Sulfa antibiotics, Tea, and Sulfamethoxazole    Review of Systems   Review of Systems  Constitutional:  Positive for fatigue. Negative for diaphoresis and fever.  HENT:  Positive for sinus pressure, sinus pain and sore throat.   Respiratory:  Negative for cough and shortness of breath.   Cardiovascular:  Positive for chest pain.  Gastrointestinal:  Negative for abdominal pain, nausea and vomiting.  Musculoskeletal:  Positive for myalgias.  Skin:  Negative for rash.  Neurological:  Positive for headaches.    Physical Exam Updated Vital Signs BP (!) 166/114 (BP Location: Right Arm) Comment: Will recheck  Pulse 81   Temp 98 F (36.7 C) (Oral)   Resp 15   Ht '5\' 5"'$  (1.651 m)   Wt 116.1 kg   LMP 11/07/2022 (Exact Date)   SpO2 100%   BMI 42.60 kg/m  Physical Exam Vitals and nursing note reviewed.  Constitutional:      General: She is not in acute distress.    Appearance: She is well-developed.  HENT:     Head: Normocephalic and atraumatic.  Eyes:     Conjunctiva/sclera: Conjunctivae normal.  Cardiovascular:     Rate and Rhythm: Normal rate and regular rhythm.     Heart sounds: Normal heart sounds. No murmur heard. Pulmonary:     Effort: Pulmonary effort is normal. No respiratory distress.     Breath sounds: Normal breath sounds.  Abdominal:     Palpations: Abdomen is soft.     Tenderness: There is no abdominal tenderness.  Musculoskeletal:        General: No swelling.      Cervical back: Neck supple.     Right lower leg: No tenderness. No edema.     Left lower leg: No tenderness. No edema.  Skin:    General: Skin is warm and dry.     Capillary Refill: Capillary refill takes less than 2 seconds.  Neurological:     General: No focal deficit present.     Mental Status: She is alert.  Psychiatric:        Mood and Affect: Mood normal.     ED Results / Procedures / Treatments   Labs (all labs ordered are listed, but only abnormal results are displayed) Labs Reviewed  RESP PANEL BY RT-PCR (RSV, FLU A&B, COVID)  RVPGX2 -  Abnormal; Notable for the following components:      Result Value   Resp Syncytial Virus by PCR POSITIVE (*)    All other components within normal limits  CBC - Abnormal; Notable for the following components:   Hemoglobin 10.7 (*)    HCT 32.9 (*)    All other components within normal limits  BASIC METABOLIC PANEL  PREGNANCY, URINE  TROPONIN I (HIGH SENSITIVITY)  TROPONIN I (HIGH SENSITIVITY)    EKG None ECG not crossing into epic.  Normal sinus rhythm rate of 85 nonspecific T waves no acute changes from prior. Radiology DG Chest 2 View  Result Date: 12/06/2022 CLINICAL DATA:  Chest pain with left upper extremity radiation. EXAM: CHEST - 2 VIEW COMPARISON:  Portable chest 06/04/2022 FINDINGS: The heart size and mediastinal contours are within normal limits. Both lungs are clear. The visualized skeletal structures are unremarkable. IMPRESSION: No active cardiopulmonary disease.  Stable chest. Electronically Signed   By: Telford Nab M.D.   On: 12/06/2022 06:36    Procedures Procedures    Medications Ordered in ED Medications  ketorolac (TORADOL) 30 MG/ML injection 30 mg (30 mg Intravenous Given 12/06/22 1126)  sodium chloride 0.9 % bolus 500 mL (500 mLs Intravenous New Bag/Given 12/06/22 1125)    ED Course/ Medical Decision Making/ A&P Clinical Course as of 12/06/22 1758  Wed Dec 06, 2022  1147 Patient states both of her  IVs are burning and she wants them out.  Her workup has been negative other than the RSV.  Troponin negative. [MB]    Clinical Course User Index [MB] Hayden Rasmussen, MD                           Medical Decision Making Amount and/or Complexity of Data Reviewed Labs: ordered. Radiology: ordered.  Risk Prescription drug management.   This patient complains of flulike symptoms and chest pain; this involves an extensive number of treatment Options and is a complaint that carries with it a high risk of complications and morbidity. The differential includes headache, flu, COVID, RSV, pneumonia, ACS  I ordered, reviewed and interpreted labs, which included cardiac workup unremarkable, hemoglobin low stable from priors, COVID and flu negative RSV positive I ordered medication Toradol and IV fluids and reviewed PMP when indicated. I ordered imaging studies which included chest x-ray and I independently    visualized and interpreted imaging which showed no acute findings Previous records obtained and reviewed in epic no recent admissions Cardiac monitoring reviewed, normal sinus rhythm Social determinants considered, no significant barriers Critical Interventions: None  After the interventions stated above, I reevaluated the patient and found patient to be improved although now complaining of burning in her arms from her IV.  She is asking for them to be removed so she can be discharged. Admission and further testing considered, no indications for admission or further workup at this time.  Recommended symptomatic treatment of her arms with warm compresses and Tylenol.  Return instructions discussed.         Final Clinical Impression(s) / ED Diagnoses Final diagnoses:  RSV infection    Rx / DC Orders ED Discharge Orders     None         Hayden Rasmussen, MD 12/06/22 1800

## 2022-12-06 NOTE — ED Notes (Signed)
IV attempt X1, unsuccessful. Able to collect labs & repeat troponin sent

## 2023-01-01 NOTE — H&P (Signed)
Nicole [name] is an 47 y.o. female, [phone] with [facility] and uterine fibroids, who desires definite therapy. GYN U/S, uterine fibroids, vol 169 gms.  H/O BTL H/O ectopic pregnancy H/O TSVD x 2   Menstrual History: Menarche age: 17 Patient's last menstrual period was 11/07/2022 (exact date).    Past Medical History:  Diagnosis Date   Anemia    Asthma    Atypical chest pain    a. ?pericarditis 2015.   Blood transfusion 2011   r/t anemia   Fibroid    Migraine    Rheumatoid arthritis (HCC)    Seizures (HCC)    Sickle cell trait (HCC)    Stroke (HCC)    Tooth abscess     Past Surgical History:  Procedure Laterality Date   TUBAL LIGATION Bilateral 2005    Family History  Problem Relation Age of Onset   Hypertension Mother    Diabetes Mother    Cancer Mother        Colon, brain, 2 other   Asthma Mother    Clotting disorder Mother    Heart disease Mother        9, stents   Stroke Mother        2012   Breast cancer Mother 69   Diabetes Father    Asthma Father    Clotting disorder Father    Sickle cell anemia Father    Heart disease Father        2000, stents   Hypertension Father    Stroke Father        2007   Diabetes Maternal Grandmother    Breast cancer Maternal Grandmother    Asthma Sister    Asthma Brother    Asthma Paternal Grandmother    Breast cancer Maternal Grandfather    Anesthesia problems Neg Hx     Social History:  reports that she quit smoking about 8 years ago. Her smoking use included cigarettes. She has been exposed to tobacco smoke. She has never used smokeless tobacco. She reports that she does not drink alcohol and does not use drugs.  Allergies:  Allergies  Allergen Reactions   Green Tea Leaf Ext Other (See Comments)    All teas   Penicillins Anaphylaxis, Swelling and Rash    Has patient had a PCN reaction causing immediate rash, facial/tongue/throat swelling, SOB or lightheadedness with hypotension: Yes Has patient had a PCN  reaction causing severe rash involving mucus membranes or skin necrosis: Yes Has patient had a PCN reaction that required hospitalization Yes- ed visit Has patient had a PCN reaction occurring within the last 10 years: No-childhood allergy If all of the above answers are "NO", then may proceed with Cephalosporin use.  Throat Swelling Throat Swelling Has patient had a PCN reaction causing immediate rash, facial/tongue/throat swelling, SOB or lightheadedness with hypotension: Yes Has patient had a PCN reaction causing severe rash involving mucus membranes or skin necrosis: Yes Has patient had a PCN reaction that required hospitalization Yes- ed visit Has patient had a PCN reaction occurring within the last 10 years: No-childhood allergy If all of the above answers are "NO", then may proceed with Cephalosporin use.  Throat Swelling   Shellfish Allergy Anaphylaxis   Shrimp Extract Allergy Skin Test Other (See Comments)   Sulfa Antibiotics Anaphylaxis, Swelling and Rash   Tea Anaphylaxis    All teas   Sulfamethoxazole Swelling, Rash and Other (See Comments)    Throat Swelling    No medications prior to  admission.    Review of Systems  Constitutional: Negative.   Respiratory: Negative.    Cardiovascular: Negative.   Gastrointestinal: Negative.   Genitourinary:  Positive for menstrual problem.    Last menstrual period 11/07/2022. Physical Exam Constitutional:      Appearance: Normal appearance.  Cardiovascular:     Rate and Rhythm: Normal rate and regular rhythm.  Pulmonary:     Effort: Pulmonary effort is normal.     Breath sounds: Normal breath sounds.  Abdominal:     General: Bowel sounds are normal.     Palpations: Abdomen is soft.  Genitourinary:    Comments: Nl EGBUS, uterus < 10 weeks, small, mobile, non tender, no masses    No results found for this or any previous visit (from the past 24 hour(s)).  No results found.  Assessment/Plan: [facility] Uterine  fibroids  TVH with possible salpingectomy reviewed with pt. R/B/Post op care reviewed. Pt has verbalized understanding and desires to proceed.  [name] 01/01/2023, 11:01 AM

## 2023-01-03 NOTE — Pre-Procedure Instructions (Signed)
Surgical Instructions    Your procedure is scheduled on Tuesday, January 30.  Report to [facility] Main Entrance "A" at 8:00 A.M., then check in with the Admitting office.  Call this number if you have problems the morning of surgery:  [phone]   If you have any questions prior to your surgery date call [phone]: Open Monday-Friday 8am-4pm If you experience any cold or flu symptoms such as cough, fever, chills, shortness of breath, etc. between now and your scheduled surgery, please notify us at the above number     Remember:  Do not eat after midnight the night before your surgery  You may drink clear liquids until 7:00AM the morning of your surgery.   Clear liquids allowed are: Water, Non-Citrus Juices (without pulp), Carbonated Beverages, Clear Tea, Black Coffee ONLY (NO MILK, CREAM OR POWDERED CREAMER of any kind), and Gatorade    Take these medicines the morning of surgery with A SIP OF WATER:  cetirizine (ZYRTEC)  fluticasone (FLONASE) 50 MCG/ACT nasal spray  levETIRAcetam (KEPPRA)  megestrol (MEGACE)   If needed: promethazine (PHENERGAN)  SUMAtriptan (IMITREX)  traMADol (ULTRAM)   As of today, STOP taking any Aspirin (unless otherwise instructed by your surgeon) Aleve, Naproxen, Ibuprofen, Motrin, Advil, Goody's, BC's, all herbal medications, fish oil, and all vitamins.  **Please be ready to provide urine sample upon arrival to the [facility] not [facility] any belongings or valuables.    Do NOT Smoke (Tobacco/Vaping)  24 hours prior to your procedure  If you use a CPAP at night, you may bring your mask for your overnight stay.   Contacts, glasses, hearing aids, dentures or partials may not be worn into surgery, please bring cases for these belongings   For patients admitted to the hospital, discharge time will be determined by your treatment team.   Patients discharged the day of surgery will not be allowed to drive home, and someone  needs to stay with them for 24 hours.   SURGICAL WAITING ROOM VISITATION Patients having surgery or a procedure may have no more than 2 support people in the waiting area - these visitors may rotate.   Children under the age of 60 must have an adult with them who is not the patient. If the patient needs to stay at the hospital during part of their recovery, the visitor guidelines for inpatient rooms apply. Pre-op nurse will coordinate an appropriate time for 1 support person to accompany patient in pre-op.  This support person may not rotate.   Please refer to [email or web address] for the visitor guidelines for Inpatients (after your surgery is over and you are in a regular room).    Special instructions:    Oral Hygiene is also important to reduce your risk of infection.  Remember - BRUSH YOUR TEETH THE MORNING OF SURGERY WITH YOUR REGULAR TOOTHPASTE   [place]- Preparing For Surgery  Before surgery, you can play an important role. Because skin is not sterile, your skin needs to be as free of germs as possible. You can reduce the number of germs on your skin by washing with CHG (chlorahexidine gluconate) Soap before surgery.  CHG is an antiseptic cleaner which kills germs and bonds with the skin to continue killing germs even after washing.     Please do not use if you have an allergy to CHG or antibacterial soaps. If your skin becomes reddened/irritated stop using the CHG.  Do not shave (including legs and underarms) for  at least 48 hours prior to first CHG shower. It is OK to shave your face.  Please follow these instructions carefully.     Shower the NIGHT BEFORE SURGERY and the MORNING OF SURGERY with CHG Soap.   If you chose to wash your hair, wash your hair first as usual with your normal shampoo. After you shampoo, rinse your hair and body thoroughly to remove the shampoo.  Then [employer] and genitals (private parts) with your  normal soap and rinse thoroughly to remove soap.  After that Use CHG Soap as you would any other liquid soap. You can apply CHG directly to the skin and wash gently with a scrungie or a clean washcloth.   Apply the CHG Soap to your body ONLY FROM THE NECK DOWN.  Do not use on open wounds or open sores. Avoid contact with your eyes, ears, mouth and genitals (private parts). Wash Face and genitals (private parts)  with your normal soap.   Wash thoroughly, paying special attention to the area where your surgery will be performed.  Thoroughly rinse your body with warm water from the neck down.  DO NOT shower/wash with your normal soap after using and rinsing off the CHG Soap.  Pat yourself dry with a CLEAN TOWEL.  Wear CLEAN PAJAMAS to bed the night before surgery  Place CLEAN SHEETS on your bed the night before your surgery  DO NOT SLEEP WITH PETS.   Day of Surgery:  Take a shower with CHG soap. Wear Clean/Comfortable clothing the morning of surgery  Do not wear jewelry or makeup. Do not wear lotions, powders, perfumes/cologne or deodorant. Do not shave 48 hours prior to surgery.  Men may shave face and neck. Do not bring valuables to the hospital. Do not wear nail polish, gel polish, artificial nails, or any other type of covering on natural nails (fingers and toes) If you have artificial nails or gel coating that need to be removed by a nail salon, please have this removed prior to surgery. Artificial nails or gel coating may interfere with anesthesia's ability to adequately monitor your vital signs. Remember to brush your teeth WITH YOUR REGULAR TOOTHPASTE.    If you received a COVID test during your pre-op visit, it is requested that you wear a mask when out in public, stay away from anyone that may not be feeling well, and notify your surgeon if you develop symptoms. If you have been in contact with anyone that has tested positive in the last 10 days, please notify your  surgeon.    Please read over the following fact sheets that you were given.

## 2023-01-04 ENCOUNTER — Other Ambulatory Visit: Payer: Self-pay

## 2023-01-04 ENCOUNTER — Encounter (HOSPITAL_COMMUNITY)
Admission: RE | Admit: 2023-01-04 | Discharge: 2023-01-04 | Disposition: A | Discharge status: Home / Self Care | Payer: 59 | Source: Ambulatory Visit | Attending: Obstetrics and Gynecology | Admitting: Obstetrics and Gynecology

## 2023-01-04 ENCOUNTER — Encounter (HOSPITAL_COMMUNITY): Payer: Self-pay

## 2023-01-04 VITALS — BP 150/99 | HR 83 | Temp 98.3°F | Resp 17 | Ht 65.0 in | Wt 271.0 lb

## 2023-01-04 DIAGNOSIS — Z01812 Encounter for preprocedural laboratory examination: Secondary | ICD-10-CM | POA: Insufficient documentation

## 2023-01-04 DIAGNOSIS — N920 Excessive and frequent menstruation with regular cycle: Secondary | ICD-10-CM | POA: Insufficient documentation

## 2023-01-04 DIAGNOSIS — Z01818 Encounter for other preprocedural examination: Secondary | ICD-10-CM

## 2023-01-04 DIAGNOSIS — D259 Leiomyoma of uterus, unspecified: Secondary | ICD-10-CM | POA: Insufficient documentation

## 2023-01-04 LAB — BASIC METABOLIC PANEL
Anion gap: 7 (ref 5–15)
BUN: 8 mg/dL (ref 6–20)
CO2: 20 mmol/L — ABNORMAL LOW (ref 22–32)
Calcium: 8.9 mg/dL (ref 8.9–10.3)
Chloride: 110 mmol/L (ref 98–111)
Creatinine, Ser: 0.89 mg/dL (ref 0.44–1.00)
GFR, Estimated: >60 mL/min (ref >60)
Glucose, Bld: 105 mg/dL — ABNORMAL HIGH (ref 70–99)
Potassium: 3.5 mmol/L (ref 3.5–5.1)
Sodium: 137 mmol/L (ref 135–145)

## 2023-01-04 LAB — CBC
HCT: 32.8 % — ABNORMAL LOW (ref 36.0–46.0)
Hemoglobin: 10 g/dL — ABNORMAL LOW (ref 12.0–15.0)
MCH: 26.2 pg (ref 26.0–34.0)
MCHC: 30.5 g/dL (ref 30.0–36.0)
MCV: 85.9 fL (ref 80.0–100.0)
Platelets: 321 10*3/uL (ref 150–400)
RBC: 3.82 MIL/uL — ABNORMAL LOW (ref 3.87–5.11)
RDW: 14.2 % (ref 11.5–15.5)
WBC: 6 10*3/uL (ref 4.0–10.5)
nRBC: 0 % (ref 0.0–0.2)

## 2023-01-04 LAB — SURGICAL PCR SCREEN
MRSA, PCR: NEGATIVE
Staphylococcus aureus: NEGATIVE

## 2023-01-04 LAB — TYPE AND SCREEN
ABO/RH(D): AB POS
Antibody Screen: NEGATIVE

## 2023-01-04 NOTE — Progress Notes (Signed)
PCP - Abifail Wenzlick Cardiologist - [place] Siren nelson Neurologist: Maurie Boettcher, MD   or Creig Hines  PPM/ICD - denies  Chest x-ray - n/a EKG - 12/08/22 Stress Test - ordered in 2015 but not completed because they couldn't get an IV ECHO - 06/22/21 Cardiac Cath - denies  Sleep Study - denies   ERAS Protcol -yes PRE-SURGERY Ensure or G2- not ordered  COVID TEST- not needed   Anesthesia review: yes, history of stroke in 2008. Cardiology visit for pericarditis in 2015. Stress test ordered afterwards but never completed because they couldn't get an IV started. No further notes from cardiology thereafter   Patient denies shortness of breath, fever, cough and chest pain at PAT appointment   All instructions explained to the patient, with a verbal understanding of the material. Patient agrees to go over the instructions while at home for a better understanding. Patient also instructed to self quarantine after being tested for COVID-19. The opportunity to ask questions was provided.

## 2023-01-05 NOTE — Progress Notes (Signed)
Anesthesia Chart Review:  Pt has had multiple prior evaluations for chest pain and questionable pericarditis. Most recently she was admitted in July of 2022 for eval of chest pain. Initial troponin was elevated, however, followup troponin x2 were <2 and initial value was felt to be spurious/lab error. Per consult note from cardiologist Dr. Debara Pickett on 06/22/21, "This is a 47 year old female with a history of presumed pericarditis in the past. She was supposed to have an cardiac MRI however issues with IV access did not allow completion of the study. She had a similar presentation in 2015 with no evidence of ongoing pericarditis and colchicine was stopped by Dr. Meda Coffee. I do not feel this is an acute coronary syndrome. Her troponins will be retested I suspect will result negative. Echocardiogram was performed today but not formally reported, I personally reviewed the images which demonstrate normal systolic function and no regional wall motion abnormalities. I do not feel further cardiac work-up is necessary at this time."  Follows with neurology at [facility] for history of seizures and chronic migraines. She is maintained on Keppra for seizure prophylaxis and Aimovig for migraine prophylaxis.   History of RA, not on any DMARDs.  History of asthma, maintained on montelukast.  Recent ED visit 12/06/22 with complaint of congestion, body aches, chest pain. Workup reassuring, troponin negative x 2. RSV positive. Discharged with recommendation for supportive care.   Preop labs reviewed, chronic anemia with Hgb 10.0, otherwise unremarkable.   EKG 12/06/22: NSR. Rate 85. Cannot rule out anterior infarct, age undetermined.   TTE 06/22/21:  1. Left ventricular ejection fraction, by estimation, is 55 to 60%. The  left ventricle has normal function. The left ventricle has no regional  wall motion abnormalities. Left ventricular diastolic parameters were  normal.   2. Right ventricular systolic function is normal.  The right ventricular  size is normal. There is normal pulmonary artery systolic pressure. The  estimated right ventricular systolic pressure is 49.8 mmHg.   3. The mitral valve is normal in structure. Trivial mitral valve  regurgitation. No evidence of mitral stenosis.   4. The aortic valve is tricuspid. Aortic valve regurgitation is not  visualized. No aortic stenosis is present.   5. The inferior vena cava is normal in size with greater than 50%  respiratory variability, suggesting right atrial pressure of 3 mmHg.     Wynonia Musty [facility] Short Stay Center/Anesthesiology Phone [phone] 01/05/2023 3:23 PM

## 2023-01-05 NOTE — Anesthesia Preprocedure Evaluation (Signed)
Anesthesia Evaluation  Patient identified by MRN, date of birth, ID band Patient awake    Reviewed: Allergy & Precautions, NPO status , Patient's Chart, lab work & pertinent test results, reviewed documented beta blocker date and time   Airway Mallampati: II  TM Distance: >3 FB Neck ROM: Full    Dental  (+) Partial Upper, Dental Advisory Given, Missing   Pulmonary asthma , sleep apnea and Continuous Positive Airway Pressure Ventilation , former smoker   Pulmonary exam normal breath sounds clear to auscultation       Cardiovascular negative cardio ROS Normal cardiovascular exam Rhythm:Regular Rate:Normal     Neuro/Psych  Headaches, Seizures -,  PSYCHIATRIC DISORDERS  Depression    CVA    GI/Hepatic Neg liver ROS,GERD  Medicated and Controlled,,  Endo/Other    Morbid obesity  Renal/GU negative Renal ROS  negative genitourinary   Musculoskeletal  (+) Arthritis , Osteoarthritis,    Abdominal  (+) + obese  Peds  Hematology  (+) Blood dyscrasia, Sickle cell trait and anemia   Anesthesia Other Findings   Reproductive/Obstetrics Menorrhagia  Uterine Leiomyoma                             Anesthesia Physical Anesthesia Plan  ASA: 3  Anesthesia Plan: General   Post-op Pain Management: Minimal or no pain anticipated, Precedex, Dilaudid IV and Ketamine IV*   Induction: Intravenous  PONV Risk Score and Plan: 4 or greater and Treatment may vary due to age or medical condition, Midazolam, Scopolamine patch - Pre-op, Propofol infusion, Ondansetron, Dexamethasone and Diphenhydramine  Airway Management Planned: Oral ETT  Additional Equipment: None  Intra-op Plan:   Post-operative Plan: Extubation in OR  Informed Consent: I have reviewed the patients History and Physical, chart, labs and discussed the procedure including the risks, benefits and alternatives for the proposed anesthesia with the  patient or authorized representative who has indicated his/her understanding and acceptance.     Dental advisory given  Plan Discussed with: Anesthesiologist and CRNA  Anesthesia Plan Comments: (PAT note by Karoline Caldwell, PA-C: Pt has had multiple prior evaluations for chest pain and questionable pericarditis. Most recently she was admitted in July of 2022 for eval of chest pain. Initial troponin was elevated, however, followup troponin x2 were <2 and initial value was felt to be spurious/lab error. Per consult note from cardiologist Dr. Debara Pickett on 06/22/21, "This is a 47 year old female with a history of presumed pericarditis in the past. She was supposed to have an cardiac MRI however issues with IV access did not allow completion of the study. She had a similar presentation in 2015 with no evidence of ongoing pericarditis and colchicine was stopped by Dr. Meda Coffee. I do not feel this is an acute coronary syndrome. Her troponins will be retested I suspect will result negative. Echocardiogram was performed today but not formally reported, I personally reviewed the images which demonstrate normal systolic function and no regional wall motion abnormalities. I do not feel further cardiac work-up is necessary at this time."  Follows with neurology at [facility] for history of seizures and chronic migraines. She is maintained on Keppra for seizure prophylaxis and Aimovig for migraine prophylaxis.   History of RA, not on any DMARDs.  History of asthma, maintained on montelukast.  Recent ED visit 12/06/22 with complaint of congestion, body aches, chest pain. Workup reassuring, troponin negative x 2. RSV positive. Discharged with recommendation for supportive care.   Preop  labs reviewed, chronic anemia with Hgb 10.0, otherwise unremarkable.   EKG 12/06/22: NSR. Rate 85. Cannot rule out anterior infarct, age undetermined.   TTE 06/22/21: 1. Left ventricular ejection fraction, by estimation, is 55 to 60%. The   left ventricle has normal function. The left ventricle has no regional  wall motion abnormalities. Left ventricular diastolic parameters were  normal.  2. Right ventricular systolic function is normal. The right ventricular  size is normal. There is normal pulmonary artery systolic pressure. The  estimated right ventricular systolic pressure is 41.1 mmHg.  3. The mitral valve is normal in structure. Trivial mitral valve  regurgitation. No evidence of mitral stenosis.  4. The aortic valve is tricuspid. Aortic valve regurgitation is not  visualized. No aortic stenosis is present.  5. The inferior vena cava is normal in size with greater than 50%  respiratory variability, suggesting right atrial pressure of 3 mmHg.   )        Anesthesia Quick Evaluation

## 2023-01-08 ENCOUNTER — Encounter (HOSPITAL_COMMUNITY): Payer: Self-pay | Admitting: Obstetrics and Gynecology

## 2023-01-08 MED ORDER — METRONIDAZOLE 500 MG/100ML IV SOLN
500.0000 mg | INTRAVENOUS | Status: AC
  Administered 2023-01-09: 500 mg via INTRAVENOUS
  Filled 2023-01-08: qty 100

## 2023-01-08 MED ORDER — GENTAMICIN SULFATE 40 MG/ML IJ SOLN
420.0000 mg | INTRAVENOUS | Status: AC
  Administered 2023-01-09: 420 mg via INTRAVENOUS
  Filled 2023-01-08 (×2): qty 10.5

## 2023-01-08 NOTE — Progress Notes (Signed)
Patient was called and informed that the surgery time for tomorrow was changed to 08:40 o'clock. Patient was instructed to be at the hospital at 06:40 o'clock and to stop drinking clear liquids at 05:40 o'clock. Patient verbalized understanding.

## 2023-01-09 ENCOUNTER — Encounter (HOSPITAL_COMMUNITY)
Admission: RE | Disposition: A | Discharge status: Home / Self Care | Payer: Self-pay | Source: Home / Self Care | Attending: Obstetrics and Gynecology

## 2023-01-09 ENCOUNTER — Other Ambulatory Visit: Payer: Self-pay

## 2023-01-09 ENCOUNTER — Observation Stay (HOSPITAL_COMMUNITY): Payer: 59 | Admitting: Physician Assistant

## 2023-01-09 ENCOUNTER — Observation Stay (HOSPITAL_COMMUNITY)
Admission: RE | Admit: 2023-01-09 | Discharge: 2023-01-10 | Disposition: A | Discharge status: Home / Self Care | Payer: 59 | Attending: Obstetrics and Gynecology | Admitting: Obstetrics and Gynecology

## 2023-01-09 ENCOUNTER — Ambulatory Visit: Payer: 59

## 2023-01-09 ENCOUNTER — Observation Stay (HOSPITAL_BASED_OUTPATIENT_CLINIC_OR_DEPARTMENT_OTHER): Payer: 59 | Admitting: Anesthesiology

## 2023-01-09 ENCOUNTER — Encounter (HOSPITAL_COMMUNITY): Payer: Self-pay | Admitting: Obstetrics and Gynecology

## 2023-01-09 DIAGNOSIS — Z87891 Personal history of nicotine dependence: Secondary | ICD-10-CM

## 2023-01-09 DIAGNOSIS — J45909 Unspecified asthma, uncomplicated: Secondary | ICD-10-CM

## 2023-01-09 DIAGNOSIS — Z8673 Personal history of transient ischemic attack (TIA), and cerebral infarction without residual deficits: Secondary | ICD-10-CM | POA: Insufficient documentation

## 2023-01-09 DIAGNOSIS — N888 Other specified noninflammatory disorders of cervix uteri: Secondary | ICD-10-CM

## 2023-01-09 DIAGNOSIS — N920 Excessive and frequent menstruation with regular cycle: Secondary | ICD-10-CM | POA: Diagnosis not present

## 2023-01-09 DIAGNOSIS — G4733 Obstructive sleep apnea (adult) (pediatric): Secondary | ICD-10-CM

## 2023-01-09 DIAGNOSIS — Z9989 Dependence on other enabling machines and devices: Secondary | ICD-10-CM | POA: Diagnosis not present

## 2023-01-09 DIAGNOSIS — Z01818 Encounter for other preprocedural examination: Secondary | ICD-10-CM

## 2023-01-09 DIAGNOSIS — Z9889 Other specified postprocedural states: Secondary | ICD-10-CM

## 2023-01-09 DIAGNOSIS — D259 Leiomyoma of uterus, unspecified: Secondary | ICD-10-CM | POA: Diagnosis not present

## 2023-01-09 DIAGNOSIS — N889 Noninflammatory disorder of cervix uteri, unspecified: Secondary | ICD-10-CM | POA: Diagnosis not present

## 2023-01-09 HISTORY — PX: VAGINAL HYSTERECTOMY: SHX2639

## 2023-01-09 LAB — POCT PREGNANCY, URINE: Preg Test, Ur: NEGATIVE

## 2023-01-09 SURGERY — HYSTERECTOMY, VAGINAL
Anesthesia: General | Site: Uterus

## 2023-01-09 MED ORDER — LEVETIRACETAM 500 MG PO TABS
1000.0000 mg | ORAL_TABLET | Freq: Every day | ORAL | Status: DC
  Administered 2023-01-10: 1000 mg via ORAL
  Filled 2023-01-09: qty 2

## 2023-01-09 MED ORDER — LACTATED RINGERS IV SOLN
INTRAVENOUS | Status: DC
  Administered 2023-01-09 – 2023-01-10 (×2): 125 mL/h via INTRAVENOUS

## 2023-01-09 MED ORDER — ORAL CARE MOUTH RINSE: 15.0000 mL | Freq: Once | OROMUCOSAL | Status: AC

## 2023-01-09 MED ORDER — ONDANSETRON HCL 4 MG PO TABS: 4.0000 mg | ORAL_TABLET | Freq: Four times a day (QID) | ORAL | Status: DC | PRN

## 2023-01-09 MED ORDER — MIDAZOLAM HCL 2 MG/2ML IJ SOLN
INTRAMUSCULAR | Status: DC | PRN
  Administered 2023-01-09: 2 mg via INTRAVENOUS

## 2023-01-09 MED ORDER — ROCURONIUM BROMIDE 10 MG/ML (PF) SYRINGE
PREFILLED_SYRINGE | INTRAVENOUS | Status: AC
  Filled 2023-01-09: qty 10

## 2023-01-09 MED ORDER — PROPOFOL 10 MG/ML IV BOLUS
INTRAVENOUS | Status: DC | PRN
  Administered 2023-01-09: 200 mg via INTRAVENOUS

## 2023-01-09 MED ORDER — OXYCODONE-ACETAMINOPHEN 5-325 MG PO TABS
2.0000 | ORAL_TABLET | ORAL | Status: DC | PRN
  Administered 2023-01-09 – 2023-01-10 (×2): 2 via ORAL
  Filled 2023-01-09 (×2): qty 2

## 2023-01-09 MED ORDER — ONDANSETRON HCL 4 MG/2ML IJ SOLN: 4.0000 mg | Freq: Four times a day (QID) | INTRAMUSCULAR | Status: DC | PRN

## 2023-01-09 MED ORDER — MIDAZOLAM HCL 2 MG/2ML IJ SOLN
INTRAMUSCULAR | Status: AC
  Filled 2023-01-09: qty 2

## 2023-01-09 MED ORDER — SENNA 8.6 MG PO TABS
1.0000 | ORAL_TABLET | Freq: Two times a day (BID) | ORAL | Status: DC
  Administered 2023-01-09 – 2023-01-10 (×2): 8.6 mg via ORAL
  Filled 2023-01-09 (×2): qty 1

## 2023-01-09 MED ORDER — ONDANSETRON HCL 4 MG/2ML IJ SOLN: 4.0000 mg | Freq: Once | INTRAMUSCULAR | Status: DC | PRN

## 2023-01-09 MED ORDER — ACETAMINOPHEN 500 MG PO TABS
1000.0000 mg | ORAL_TABLET | ORAL | Status: AC
  Administered 2023-01-09: 1000 mg via ORAL
  Filled 2023-01-09: qty 2

## 2023-01-09 MED ORDER — ROCURONIUM BROMIDE 10 MG/ML (PF) SYRINGE
PREFILLED_SYRINGE | INTRAVENOUS | Status: DC | PRN
  Administered 2023-01-09: 60 mg via INTRAVENOUS

## 2023-01-09 MED ORDER — AMISULPRIDE (ANTIEMETIC) 5 MG/2ML IV SOLN
INTRAVENOUS | Status: AC
  Filled 2023-01-09: qty 4

## 2023-01-09 MED ORDER — LIDOCAINE 2% (20 MG/ML) 5 ML SYRINGE
INTRAMUSCULAR | Status: DC | PRN
  Administered 2023-01-09: 40 mg via INTRAVENOUS

## 2023-01-09 MED ORDER — ONDANSETRON HCL 4 MG/2ML IJ SOLN
INTRAMUSCULAR | Status: AC
  Filled 2023-01-09: qty 2

## 2023-01-09 MED ORDER — AMISULPRIDE (ANTIEMETIC) 5 MG/2ML IV SOLN
10.0000 mg | Freq: Once | INTRAVENOUS | Status: AC
  Administered 2023-01-09: 10 mg via INTRAVENOUS

## 2023-01-09 MED ORDER — HYDROMORPHONE HCL 1 MG/ML IJ SOLN
0.2500 mg | INTRAMUSCULAR | Status: DC | PRN
  Administered 2023-01-09 (×2): 0.5 mg via INTRAVENOUS

## 2023-01-09 MED ORDER — HYDROMORPHONE HCL 1 MG/ML IJ SOLN
1.0000 mg | INTRAMUSCULAR | Status: DC | PRN
  Administered 2023-01-09 (×3): 1 mg via INTRAVENOUS
  Filled 2023-01-09 (×3): qty 1

## 2023-01-09 MED ORDER — FENTANYL CITRATE (PF) 250 MCG/5ML IJ SOLN
INTRAMUSCULAR | Status: DC | PRN
  Administered 2023-01-09: 50 ug via INTRAVENOUS
  Administered 2023-01-09 (×2): 100 ug via INTRAVENOUS
  Administered 2023-01-09 (×3): 50 ug via INTRAVENOUS
  Administered 2023-01-09: 100 ug via INTRAVENOUS

## 2023-01-09 MED ORDER — SUGAMMADEX SODIUM 200 MG/2ML IV SOLN
INTRAVENOUS | Status: DC | PRN
  Administered 2023-01-09: 400 mg via INTRAVENOUS

## 2023-01-09 MED ORDER — 0.9 % SODIUM CHLORIDE (POUR BTL) OPTIME
TOPICAL | Status: DC | PRN
  Administered 2023-01-09: 1000 mL

## 2023-01-09 MED ORDER — FENTANYL CITRATE (PF) 250 MCG/5ML IJ SOLN: INTRAMUSCULAR | Status: DC | PRN

## 2023-01-09 MED ORDER — LACTATED RINGERS IV SOLN: INTRAVENOUS | Status: DC

## 2023-01-09 MED ORDER — DEXAMETHASONE SODIUM PHOSPHATE 10 MG/ML IJ SOLN
INTRAMUSCULAR | Status: DC | PRN
  Administered 2023-01-09: 10 mg via INTRAVENOUS

## 2023-01-09 MED ORDER — ZOLPIDEM TARTRATE 5 MG PO TABS: 5.0000 mg | ORAL_TABLET | Freq: Every evening | ORAL | Status: DC | PRN

## 2023-01-09 MED ORDER — FENTANYL CITRATE (PF) 250 MCG/5ML IJ SOLN
INTRAMUSCULAR | Status: AC
  Filled 2023-01-09: qty 5

## 2023-01-09 MED ORDER — LIDOCAINE 2% (20 MG/ML) 5 ML SYRINGE
INTRAMUSCULAR | Status: AC
  Filled 2023-01-09: qty 5

## 2023-01-09 MED ORDER — DIPHENHYDRAMINE HCL 50 MG/ML IJ SOLN
INTRAMUSCULAR | Status: AC
  Filled 2023-01-09: qty 1

## 2023-01-09 MED ORDER — KETOROLAC TROMETHAMINE 15 MG/ML IJ SOLN
15.0000 mg | INTRAMUSCULAR | Status: AC
  Administered 2023-01-09: 15 mg via INTRAVENOUS
  Filled 2023-01-09: qty 1

## 2023-01-09 MED ORDER — DEXAMETHASONE SODIUM PHOSPHATE 10 MG/ML IJ SOLN
INTRAMUSCULAR | Status: AC
  Filled 2023-01-09: qty 1

## 2023-01-09 MED ORDER — SIMETHICONE 80 MG PO CHEW
80.0000 mg | CHEWABLE_TABLET | Freq: Four times a day (QID) | ORAL | Status: DC | PRN
  Administered 2023-01-09: 80 mg via ORAL
  Filled 2023-01-09: qty 1

## 2023-01-09 MED ORDER — CHLORHEXIDINE GLUCONATE 0.12 % MT SOLN
15.0000 mL | Freq: Once | OROMUCOSAL | Status: AC
  Administered 2023-01-09: 15 mL via OROMUCOSAL
  Filled 2023-01-09: qty 15

## 2023-01-09 MED ORDER — SOD CITRATE-CITRIC ACID 500-334 MG/5ML PO SOLN
30.0000 mL | ORAL | Status: AC
  Administered 2023-01-09: 30 mL via ORAL
  Filled 2023-01-09: qty 30

## 2023-01-09 MED ORDER — DIPHENHYDRAMINE HCL 50 MG/ML IJ SOLN
12.5000 mg | Freq: Four times a day (QID) | INTRAMUSCULAR | Status: DC | PRN
  Administered 2023-01-09: 12.5 mg via INTRAVENOUS

## 2023-01-09 MED ORDER — OXYCODONE-ACETAMINOPHEN 5-325 MG PO TABS
1.0000 | ORAL_TABLET | ORAL | Status: DC | PRN
  Administered 2023-01-10: 1 via ORAL
  Filled 2023-01-09: qty 1

## 2023-01-09 MED ORDER — HYDROMORPHONE HCL 1 MG/ML IJ SOLN
INTRAMUSCULAR | Status: AC
  Filled 2023-01-09: qty 1

## 2023-01-09 MED ORDER — OXYCODONE HCL 5 MG/5ML PO SOLN: 5.0000 mg | Freq: Once | ORAL | Status: DC | PRN

## 2023-01-09 MED ORDER — IBUPROFEN 800 MG PO TABS: 800.0000 mg | ORAL_TABLET | Freq: Three times a day (TID) | ORAL | Status: DC

## 2023-01-09 MED ORDER — OXYCODONE HCL 5 MG PO TABS: 5.0000 mg | ORAL_TABLET | Freq: Once | ORAL | Status: DC | PRN

## 2023-01-09 MED ORDER — PROPOFOL 10 MG/ML IV BOLUS
INTRAVENOUS | Status: AC
  Filled 2023-01-09: qty 20

## 2023-01-09 MED ORDER — KETOROLAC TROMETHAMINE 30 MG/ML IJ SOLN
INTRAMUSCULAR | Status: DC | PRN
  Administered 2023-01-09: 15 mg via INTRAVENOUS

## 2023-01-09 MED ORDER — ONDANSETRON HCL 4 MG/2ML IJ SOLN
INTRAMUSCULAR | Status: DC | PRN
  Administered 2023-01-09: 4 mg via INTRAVENOUS

## 2023-01-09 MED ORDER — KETOROLAC TROMETHAMINE 30 MG/ML IJ SOLN
30.0000 mg | Freq: Four times a day (QID) | INTRAMUSCULAR | Status: DC
  Administered 2023-01-09 – 2023-01-10 (×3): 30 mg via INTRAVENOUS
  Filled 2023-01-09 (×3): qty 1

## 2023-01-09 MED ORDER — SCOPOLAMINE 1 MG/3DAYS TD PT72
MEDICATED_PATCH | TRANSDERMAL | Status: AC
  Filled 2023-01-09: qty 1

## 2023-01-09 SURGICAL SUPPLY — 25 items
BAG COUNTER SPONGE SURGICOUNT (BAG) ×1 IMPLANT
BLADE SURG 10 STRL SS (BLADE) ×1 IMPLANT
GAUZE 4X4 16PLY ~~LOC~~+RFID DBL (SPONGE) ×2 IMPLANT
GLOVE BIO SURGEON STRL SZ7.5 (GLOVE) ×1 IMPLANT
GLOVE BIOGEL PI IND STRL 8 (GLOVE) ×1 IMPLANT
GLOVE SURG ORTHO 8.0 STRL STRW (GLOVE) ×1 IMPLANT
GLOVE SURG UNDER POLY LF SZ7 (GLOVE) ×1 IMPLANT
GOWN STRL REUS W/ TWL LRG LVL3 (GOWN DISPOSABLE) ×1 IMPLANT
GOWN STRL REUS W/ TWL XL LVL3 (GOWN DISPOSABLE) ×2 IMPLANT
GOWN STRL REUS W/TWL LRG LVL3 (GOWN DISPOSABLE) ×1
GOWN STRL REUS W/TWL XL LVL3 (GOWN DISPOSABLE) ×2
HIBICLENS CHG 4% 4OZ BTL (MISCELLANEOUS) ×1 IMPLANT
KIT TURNOVER KIT B (KITS) ×1 IMPLANT
MANIFOLD NEPTUNE II (INSTRUMENTS) ×1 IMPLANT
NS IRRIG 1000ML POUR BTL (IV SOLUTION) ×1 IMPLANT
PACK VAGINAL WOMENS (CUSTOM PROCEDURE TRAY) ×1 IMPLANT
PAD OB MATERNITY 4.3X12.25 (PERSONAL CARE ITEMS) ×1 IMPLANT
SUT VIC AB 2-0 CT1 18 (SUTURE) ×1 IMPLANT
SUT VIC AB 2-0 CT1 27 (SUTURE) ×1
SUT VIC AB 2-0 CT1 TAPERPNT 27 (SUTURE) ×1 IMPLANT
SUT VIC AB PLUS 45CM 1-MO-4 (SUTURE) ×2 IMPLANT
SUT VICRYL 1 TIES 12X18 (SUTURE) ×1 IMPLANT
TOWEL GREEN STERILE FF (TOWEL DISPOSABLE) ×1 IMPLANT
TRAY FOLEY W/BAG SLVR 14FR (SET/KITS/TRAYS/PACK) ×1 IMPLANT
UNDERPAD 30X36 HEAVY ABSORB (UNDERPADS AND DIAPERS) ×1 IMPLANT

## 2023-01-09 NOTE — Discharge Summary (Signed)
Physician Discharge Summary  Patient ID: Nicole [name] MRN: [id number] DOB/AGE: 1976-06-14 47 y.o.  Admit date: 01/09/2023 Discharge date: 01/10/23  Admission Diagnoses: Uterine fibroids  Discharge Diagnoses:  Principal Problem:   Post-operative state   Discharged Condition: good  Hospital Course: Ms Gianino was admitted with above Dx. She underwent TVH on 01/09/23 without problems. See Op note for additional information Post op course was unremarkable. She progressed to ambulating, voiding, tolerating diet and good oral pain control Felt amendable for discharge home. Discharge instructions, medications and follow up reviewed with pt. Pt verbalized understanding  Consults: None  Significant Diagnostic Studies: labs  Treatments: surgery: TVH  Discharge Exam: Blood pressure 123/81, pulse 96, temperature 98.1 F (36.7 C), temperature source Oral, resp. rate 18, height '5\' 5"'$  (1.651 m), weight 122.5 kg, SpO2 99 %. Lungs clear Heart RRR Abd soft + BS GU no bleeding Ext non tender  Disposition: Discharge disposition: 01-Home or Self Care       Discharge Instructions     Call MD for:  difficulty breathing, headache or visual disturbances   Complete by: As directed    Call MD for:  extreme fatigue   Complete by: As directed    Call MD for:  hives   Complete by: As directed    Call MD for:  persistant dizziness or light-headedness   Complete by: As directed    Call MD for:  persistant nausea and vomiting   Complete by: As directed    Call MD for:  redness, tenderness, or signs of infection (pain, swelling, redness, odor or green/yellow discharge around incision site)   Complete by: As directed    Call MD for:  severe uncontrolled pain   Complete by: As directed    Call MD for:  temperature >100.4   Complete by: As directed    Diet - low sodium heart healthy   Complete by: As directed    Increase activity slowly   Complete by: As directed    Sexual Activity Restrictions    Complete by: As directed    Pelvic rest x 4 weeks      Allergies as of 01/10/2023       Reactions   Green Tea Leaf Ext Other (See Comments)   All teas   Penicillins Anaphylaxis, Swelling, Rash   Has patient had a PCN reaction causing immediate rash, facial/tongue/throat swelling, SOB or lightheadedness with hypotension: Yes Has patient had a PCN reaction causing severe rash involving mucus membranes or skin necrosis: Yes Has patient had a PCN reaction that required hospitalization Yes- ed visit Has patient had a PCN reaction occurring within the last 10 years: No-childhood allergy If all of the above answers are "NO", then may proceed with Cephalosporin use. Throat Swelling   Shellfish Allergy Anaphylaxis   Shrimp Extract Allergy Skin Test Other (See Comments)   Sulfa Antibiotics Anaphylaxis, Swelling, Rash   Tea Anaphylaxis   All teas   Sulfamethoxazole Swelling, Rash, Other (See Comments)   Throat Swelling        Medication List     STOP taking these medications    baclofen 10 MG tablet Commonly known as: LIORESAL   Ferralet 90 90-1 MG Tabs   megestrol 40 MG tablet Commonly known as: MEGACE   methocarbamol 500 MG tablet Commonly known as: ROBAXIN   promethazine 25 MG tablet Commonly known as: PHENERGAN   traMADol 50 MG tablet Commonly known as: ULTRAM       TAKE these medications  Aimovig 140 MG/ML Soaj Generic drug: Erenumab-aooe Inject 140 mg into the skin every 30 (thirty) days.   cetirizine 10 MG tablet Commonly known as: ZYRTEC Take 10 mg by mouth daily.   EPINEPHrine 0.3 mg/0.3 mL Soaj injection Commonly known as: EPI-PEN Inject 0.3 mg into the muscle as needed for anaphylaxis.   fluticasone 50 MCG/ACT nasal spray Commonly known as: FLONASE Place 2 sprays into both nostrils daily.   ibuprofen 800 MG tablet Commonly known as: ADVIL Take 1 tablet (800 mg total) by mouth 3 (three) times daily.   levETIRAcetam 1000 MG tablet Commonly  known as: KEPPRA Take 1,000-2,000 mg by mouth in the morning, at noon, and at bedtime.   montelukast 10 MG tablet Commonly known as: SINGULAIR Take 1 tablet (10 mg total) by mouth at bedtime.   oxyCODONE-acetaminophen 5-325 MG tablet Commonly known as: PERCOCET/ROXICET Take 1 tablet by mouth every 4 (four) hours as needed for moderate pain.   SUMAtriptan 100 MG tablet Commonly known as: IMITREX Take 100 mg by mouth every 2 (two) hours as needed for migraine.        Follow-up Information     [facility]. Schedule an appointment as soon as possible for a visit in 4 week(s).   Why: face to face post op appt with Dr. Gardiner Fanti Contact information: [address] [address] [phone]                Signed: [name] 01/10/2023, 9:14 AM

## 2023-01-09 NOTE — Interval H&P Note (Signed)
History and Physical Interval Note:  01/09/2023 7:14 AM  Nicole [name]  has presented today for surgery, with the diagnosis of Menorrhagia.  The various methods of treatment have been discussed with the patient and family. After consideration of risks, benefits and other options for treatment, the patient has consented to  Procedure(s): HYSTERECTOMY VAGINAL WITH SALPINGECTOMY (Bilateral) as a surgical intervention.  The patient's history has been reviewed, patient examined, no change in status, stable for surgery.  I have reviewed the patient's chart and labs.  Questions were answered to the patient's satisfaction.     [name]

## 2023-01-09 NOTE — Anesthesia Procedure Notes (Signed)
Procedure Name: Intubation Date/Time: 01/09/2023 8:50 AM  Performed by: Lind Guest, CRNAPre-anesthesia Checklist: Patient identified, Emergency Drugs available, Suction available, Patient being monitored and Timeout performed Patient Re-evaluated:Patient Re-evaluated prior to induction Oxygen Delivery Method: Circle system utilized Preoxygenation: Pre-oxygenation with 100% oxygen Induction Type: IV induction Ventilation: Mask ventilation without difficulty Laryngoscope Size: Mac and 4 Grade View: Grade I Tube type: Oral Tube size: 7.0 mm Number of attempts: 1 Placement Confirmation: ETT inserted through vocal cords under direct vision, positive ETCO2 and breath sounds checked- equal and bilateral Secured at: 21 cm Tube secured with: Tape Dental Injury: Teeth and Oropharynx as per pre-operative assessment

## 2023-01-09 NOTE — Plan of Care (Signed)
  Problem: Education: Goal: Knowledge of General Education information will improve Description: Including pain rating scale, medication(s)/side effects and non-pharmacologic comfort measures Outcome: Completed/Met   Problem: Nutrition: Goal: Adequate nutrition will be maintained Outcome: Completed/Met   Problem: Coping: Goal: Level of anxiety will decrease Outcome: Completed/Met   Problem: Education: Goal: Understanding of sexual limitations or changes related to disease process or condition will improve Outcome: Completed/Met

## 2023-01-09 NOTE — Op Note (Signed)
Jhaniya Briski Jeanbaptiste PROCEDURE DATE: 01/09/2023  PREOPERATIVE DIAGNOSIS:  Symptomatic fibroids, menorrhagia POSTOPERATIVE DIAGNOSIS:  Symptomatic fibroids, menorrhagia SURGEON:  Arlina Robes, M.D. ASSISTANT: Eloy End, M.D.  An experienced assistant was required given the standard of surgical care given the complexity of the case.  This assistant was needed for exposure, dissection, suctioning, retraction, and for overall help during the procedure.   OPERATION:  Total Vaginal hysterectomy ANESTHESIA:  General endotracheal.  INDICATIONS: The patient is a 47 y.o. [id number] with history of symptomatic uterine fibroids/menorrhagia. The patient made a decision to undergo definite surgical treatment. On the preoperative visit, the risks, benefits, indications, and alternatives of the procedure were reviewed with the patient.  On the day of surgery, the risks of surgery were again discussed with the patient including but not limited to: bleeding which may require transfusion or reoperation; infection which may require antibiotics; injury to bowel, bladder, ureters or other surrounding organs; need for additional procedures; thromboembolic phenomenon, incisional problems and other postoperative/anesthesia complications. Written informed consent was obtained.    OPERATIVE FINDINGS: A 10 week size uterus with ovaries bilaterally. Unable to visualize tubes  ESTIMATED BLOOD LOSS: 500 ml FLUIDS:  As recorded URINE OUTPUT:  As recorded SPECIMENS:  Uterus and cervix sent to pathology COMPLICATIONS:  None immediate.  DESCRIPTION OF PROCEDURE:  The patient received intravenous antibiotics and had sequential compression devices applied to her lower extremities while in the preoperative area.  She was then taken to the operating room where general anesthesia was administered and was found to be adequate.  She was placed in the dorsal lithotomy position, and was prepped and draped in a sterile manner.  A Foley catheter  was inserted into her bladder and attached to constant drainage. After an adequate timeout was performed, attention was turned to her pelvis.  A weighted speculum was then placed in the vagina, and the  posterior lips of the cervix was grasped with a  tenaculum.  The posterior cul de sac was then entered sharply without problems. Long bill weighted speculum was placed. The anterior lip of the cervix was grasped with tenaculum.   The cervix was then circumferentially incised, and the bladder was dissected off the pubocervical fascia anteriorly without complication.  The anterior cul-de-sac was then entered sharply without difficulty and a retractor was placed.   Zeplin clamps were  then used to clamp the uterosacral ligaments on either side.  They were cut and suture ligated with 0 Vicryl.  Of note, all sutures used in this case were 0 Vicryl unless otherwise noted.   The cardinal ligaments were then clamped, cut and ligated. The uterine vessels and broad ligaments were then serially clamped with the Zeplin clamps, cut, and suture ligated on both sides.  Excellent hemostasis was noted at this point.  The uterus was the morcellated using a bivalve method to debuke the uterus. The final pedicle involving the uteroovarian was clamped, cut and suture ligated with a free tie and then in a [place] fashion This was repeated on the opposite side without problems. The uterus was then passed off for pathology.  After completion of the hysterectomy, all pedicles from the uterosacral ligament to the cornua were examined hemostasis was confirmed. I was unable to see the tubes. The peritoneum was then closed with 2/0 Vicryl in a purse string fashion.  The vaginal cuff was then closed with 2/0 Vicryl.  All instruments were then removed from the pelvis  The patient tolerated the procedure well.  All instruments,  needles, and sponge counts were correct x 2. The patient was taken to the recovery room in stable condition.    Arlina Robes MD, [facility] Attending [place], [facility]

## 2023-01-09 NOTE — Interval H&P Note (Signed)
History and Physical Interval Note:  01/09/2023 8:07 AM  Nicole [name]  has presented today for surgery, with the diagnosis of Menorrhagia.  The various methods of treatment have been discussed with the patient and family. After consideration of risks, benefits and other options for treatment, the patient has consented to  Procedure(s): HYSTERECTOMY VAGINAL WITH SALPINGECTOMY (Bilateral) as a surgical intervention.  The patient's history has been reviewed, patient examined, no change in status, stable for surgery.  I have reviewed the patient's chart and labs.  Questions were answered to the patient's satisfaction.     [name]

## 2023-01-09 NOTE — Transfer of Care (Signed)
Immediate Anesthesia Transfer of Care Note  Patient: KEMA SANTAELLA  Procedure(s) Performed: HYSTERECTOMY VAGINAL (Uterus)  Patient Location: PACU  Anesthesia Type:General  Level of Consciousness: drowsy  Airway & Oxygen Therapy: Patient Spontanous Breathing and Patient connected to face mask oxygen  Post-op Assessment: Report given to RN and Post -op Vital signs reviewed and stable  Post vital signs: Reviewed and stable  Last Vitals:  Vitals Value Taken Time  BP 143/91 01/09/23 1056  Temp 36.4 C 01/09/23 1056  Pulse 78 01/09/23 1058  Resp 17 01/09/23 1058  SpO2 100 % 01/09/23 1058  Vitals shown include unvalidated device data.  Last Pain:  Vitals:   01/09/23 0727  TempSrc:   PainSc: 5          Complications: No notable events documented.

## 2023-01-09 NOTE — Anesthesia Postprocedure Evaluation (Signed)
Anesthesia Post Note  Patient: Nicole [name]  Procedure(s) Performed: HYSTERECTOMY VAGINAL (Uterus)     Patient location during evaluation: PACU Anesthesia Type: General Level of consciousness: awake and alert and oriented Pain management: pain level controlled Vital Signs Assessment: post-procedure vital signs reviewed and stable Respiratory status: spontaneous breathing, nonlabored ventilation and respiratory function stable Cardiovascular status: stable and blood pressure returned to baseline Postop Assessment: no apparent nausea or vomiting Anesthetic complications: no   No notable events documented.  Last Vitals:  Vitals:   01/09/23 1304 01/09/23 1400  BP: (!) 159/81 (!) 145/86  Pulse: 69 69  Resp: 16 17  Temp: 36.4 C 36.8 C  SpO2: 100% 100%    Last Pain:  Vitals:   01/09/23 1400  TempSrc: Oral  PainSc: 5                  Franny Selvage A.

## 2023-01-10 ENCOUNTER — Encounter (HOSPITAL_COMMUNITY): Payer: Self-pay | Admitting: Obstetrics and Gynecology

## 2023-01-10 ENCOUNTER — Telehealth: Payer: Self-pay

## 2023-01-10 DIAGNOSIS — Z87891 Personal history of nicotine dependence: Secondary | ICD-10-CM | POA: Diagnosis not present

## 2023-01-10 DIAGNOSIS — N920 Excessive and frequent menstruation with regular cycle: Secondary | ICD-10-CM | POA: Diagnosis not present

## 2023-01-10 DIAGNOSIS — Z8673 Personal history of transient ischemic attack (TIA), and cerebral infarction without residual deficits: Secondary | ICD-10-CM | POA: Diagnosis not present

## 2023-01-10 DIAGNOSIS — J45909 Unspecified asthma, uncomplicated: Secondary | ICD-10-CM | POA: Diagnosis not present

## 2023-01-10 DIAGNOSIS — D259 Leiomyoma of uterus, unspecified: Secondary | ICD-10-CM | POA: Diagnosis not present

## 2023-01-10 LAB — CBC
HCT: 26.6 % — ABNORMAL LOW (ref 36.0–46.0)
Hemoglobin: 8.7 g/dL — ABNORMAL LOW (ref 12.0–15.0)
MCH: 26.8 pg (ref 26.0–34.0)
MCHC: 32.7 g/dL (ref 30.0–36.0)
MCV: 81.8 fL (ref 80.0–100.0)
Platelets: 291 10*3/uL (ref 150–400)
RBC: 3.25 MIL/uL — ABNORMAL LOW (ref 3.87–5.11)
RDW: 14.4 % (ref 11.5–15.5)
WBC: 10.8 10*3/uL — ABNORMAL HIGH (ref 4.0–10.5)
nRBC: 0 % (ref 0.0–0.2)

## 2023-01-10 LAB — BASIC METABOLIC PANEL
Anion gap: 9 (ref 5–15)
BUN: 5 mg/dL — ABNORMAL LOW (ref 6–20)
CO2: 22 mmol/L (ref 22–32)
Calcium: 8.8 mg/dL — ABNORMAL LOW (ref 8.9–10.3)
Chloride: 107 mmol/L (ref 98–111)
Creatinine, Ser: 0.86 mg/dL (ref 0.44–1.00)
GFR, Estimated: >60 mL/min (ref >60)
Glucose, Bld: 108 mg/dL — ABNORMAL HIGH (ref 70–99)
Potassium: 3.8 mmol/L (ref 3.5–5.1)
Sodium: 138 mmol/L (ref 135–145)

## 2023-01-10 LAB — SURGICAL PATHOLOGY

## 2023-01-10 MED ORDER — LEVETIRACETAM 750 MG PO TABS
2000.0000 mg | ORAL_TABLET | Freq: Every day | ORAL | Status: DC
  Filled 2023-01-10: qty 1

## 2023-01-10 MED ORDER — OXYCODONE-ACETAMINOPHEN 5-325 MG PO TABS: 1.0000 | ORAL_TABLET | ORAL | 0 refills | Status: DC | PRN

## 2023-01-10 MED ORDER — IBUPROFEN 800 MG PO TABS: 800.0000 mg | ORAL_TABLET | Freq: Three times a day (TID) | ORAL | 0 refills | Status: DC

## 2023-01-10 NOTE — Telephone Encounter (Signed)
Returned call and pt stated that she is waiting on her employer's HR dept to follow back up about sending paperwork

## 2023-01-10 NOTE — Progress Notes (Signed)
Pt discharged after discharge instructions given. Pt verbalized understanding. All questions answered. IV discontinued.

## 2023-01-23 ENCOUNTER — Telehealth: Payer: Self-pay | Admitting: *Deleted

## 2023-01-23 ENCOUNTER — Other Ambulatory Visit: Payer: Self-pay | Admitting: *Deleted

## 2023-01-23 DIAGNOSIS — Z9889 Other specified postprocedural states: Secondary | ICD-10-CM

## 2023-01-23 MED ORDER — IBUPROFEN 800 MG PO TABS: 800.0000 mg | ORAL_TABLET | Freq: Three times a day (TID) | ORAL | 0 refills | Status: AC

## 2023-01-23 NOTE — Telephone Encounter (Signed)
TC from pt reporting continued pain following [facility] 01/09/23. Also reports VB. Pain is 8/10 on scale. Reports may have been doing too much: lifting grandchild and heavy groceries, doing housework. Reports is out of Ibuprofen and almost out of Oxycodone.VB is minimal and only changes pad once daily. Also reports diarrhea, but is still taking colace that was recommended after surgery. Advised pt to follow discharge instructions/ restrictions given post op. Advised to DC colace until diarrhea is resolved. RX Ibuprofen refill sent. Message sent to Dr. Rip Harbour regarding pt's report of pain and RX Oxycodone request.

## 2023-01-24 ENCOUNTER — Other Ambulatory Visit (INDEPENDENT_AMBULATORY_CARE_PROVIDER_SITE_OTHER): Payer: Self-pay | Admitting: Obstetrics and Gynecology

## 2023-01-24 ENCOUNTER — Other Ambulatory Visit: Payer: Self-pay | Admitting: Obstetrics and Gynecology

## 2023-01-24 DIAGNOSIS — Z9889 Other specified postprocedural states: Secondary | ICD-10-CM

## 2023-01-24 MED ORDER — OXYCODONE-ACETAMINOPHEN 5-325 MG PO TABS: 1.0000 | ORAL_TABLET | ORAL | 0 refills | Status: DC | PRN

## 2023-01-30 IMAGING — CT CT HEAD W/O CM
4 series · 17 of 47 positions shown, 19 images · non-contrast
Comparison: May 17, 2019.

CLINICAL DATA: Headache.

EXAM:
CT HEAD WITHOUT CONTRAST
TECHNIQUE: Contiguous axial images were obtained from the base of the skull
through the vertex without intravenous contrast.

[Series 2: head wo · axial · 0.40mm/px · z∈[-115,-5]mm · 7 of 30 slices shown, 9 images]
[im 4/30  brain]
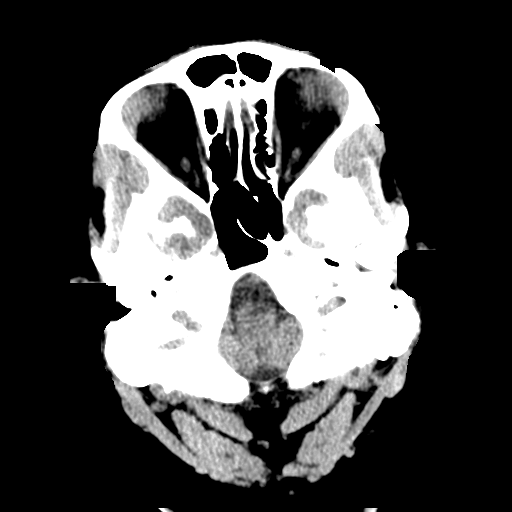
[im 4/30  bone]
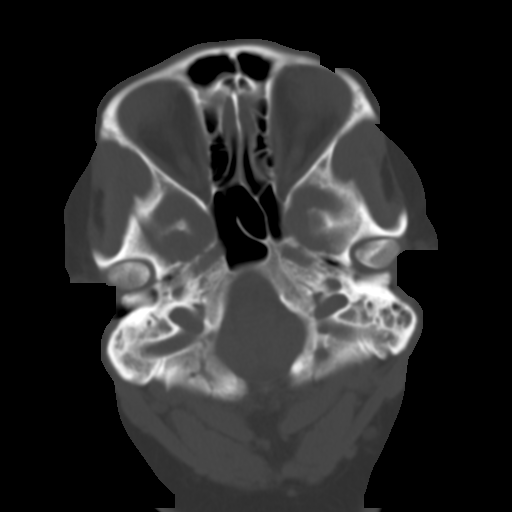
[im 8/30  brain]
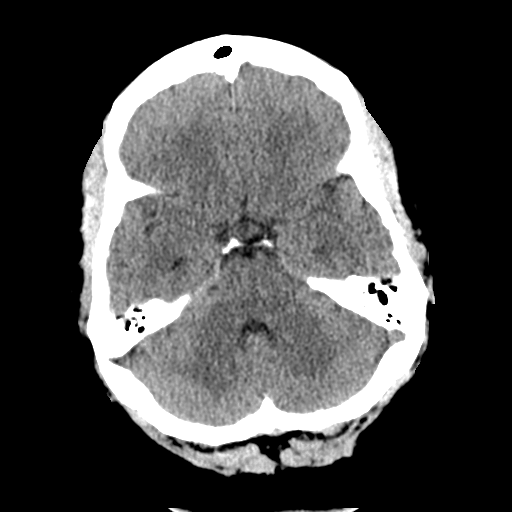
[im 11/30  brain]
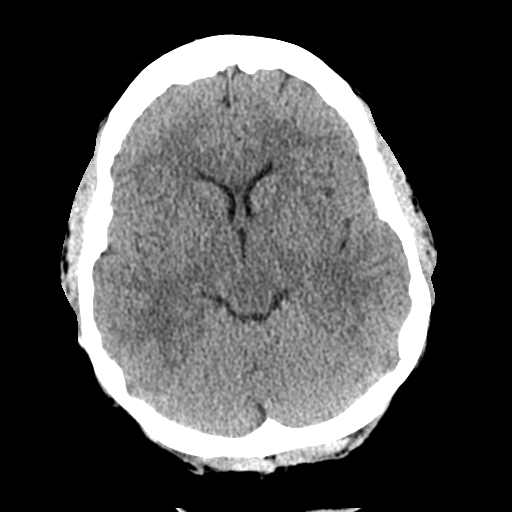
[im 15/30  brain]
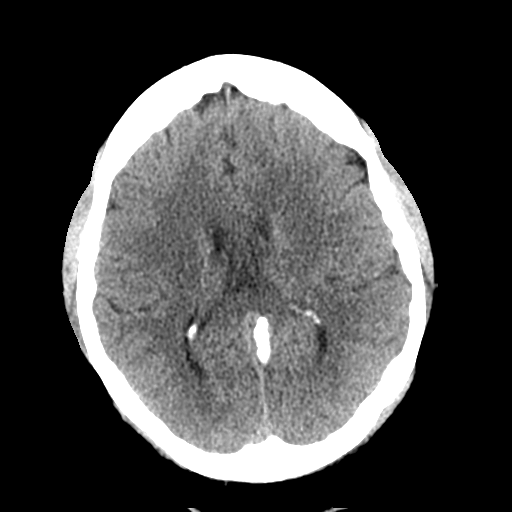
[im 19/30  brain]
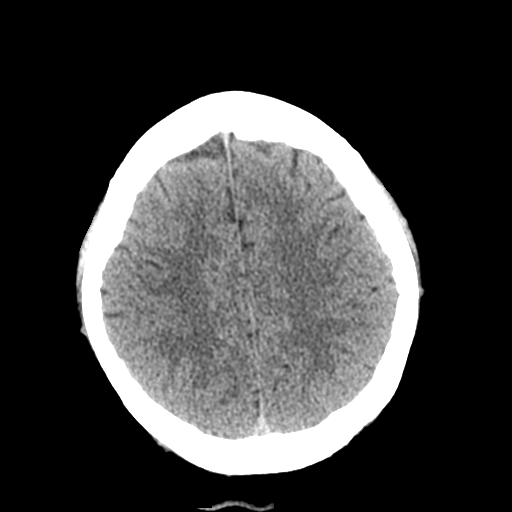
[im 19/30  bone]
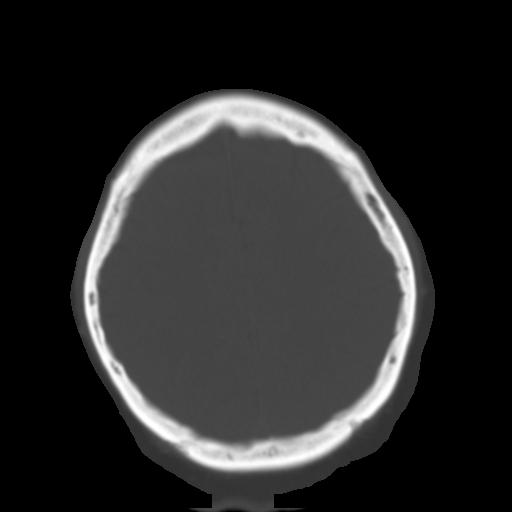
[im 22/30  brain]
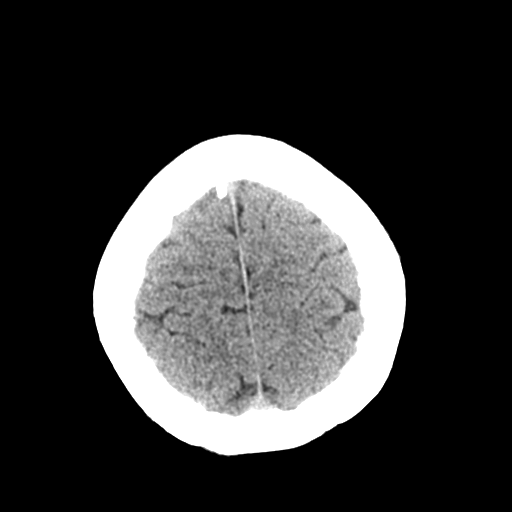
[im 26/30  brain]
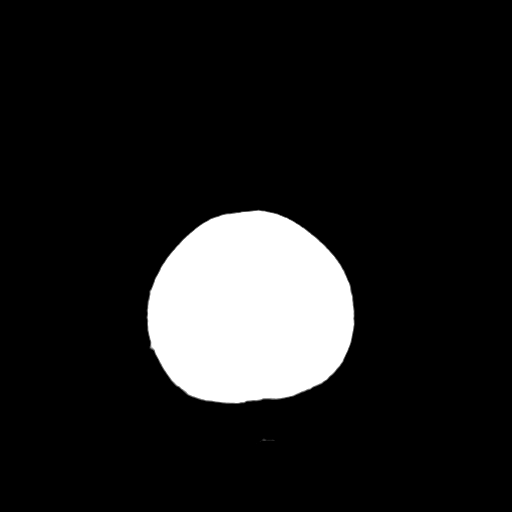

[Series 3: head bone · axial · 0.40mm/px · z∈[-116,-64]mm · 4 of 75 slices shown]
[im 8/75  bone]
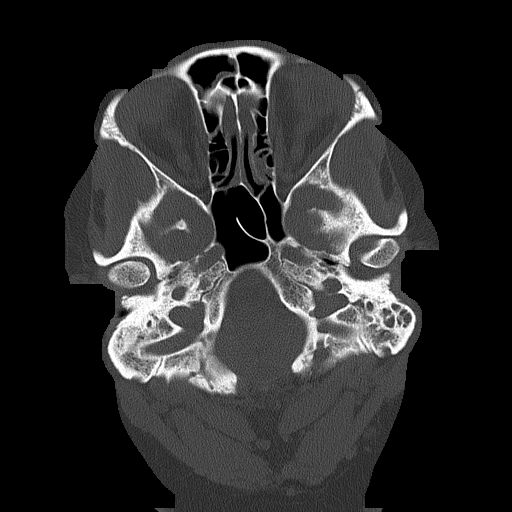
[im 15/75  bone]
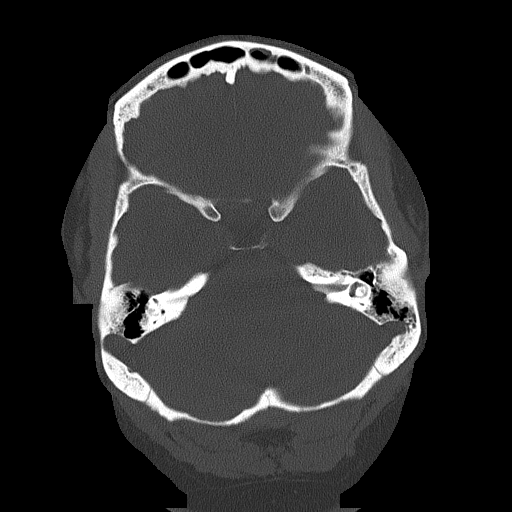
[im 23/75  bone]
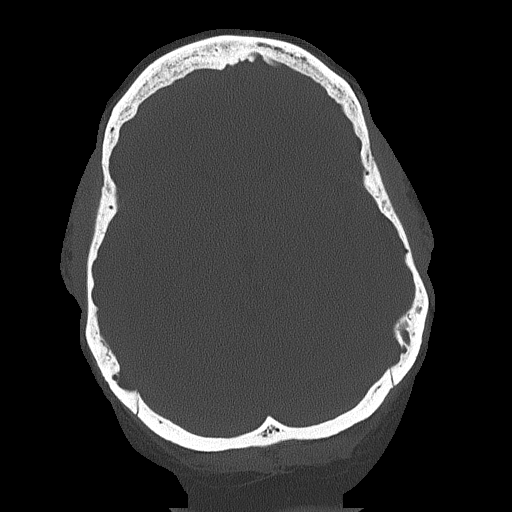
[im 34/75  bone]
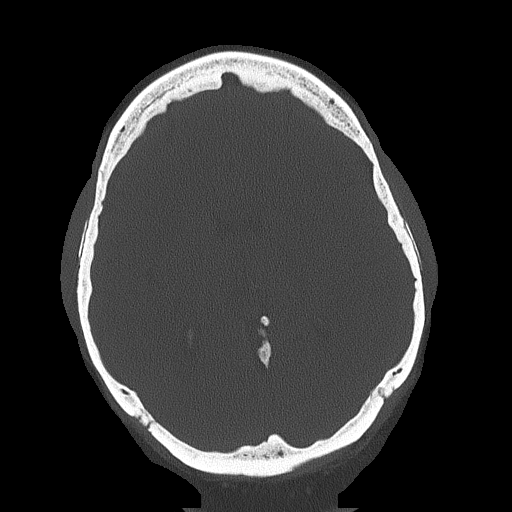

[Series 5: coronal soft tissue · coronal · 0.29mm/px · 3 of 64 slices shown]
[im 22/64  brain]
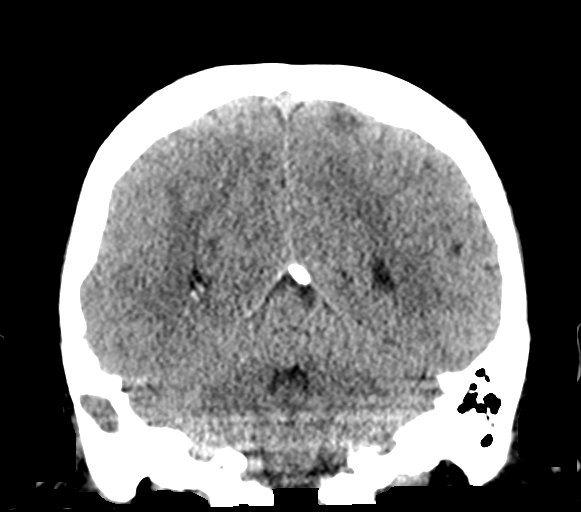
[im 29/64  brain]
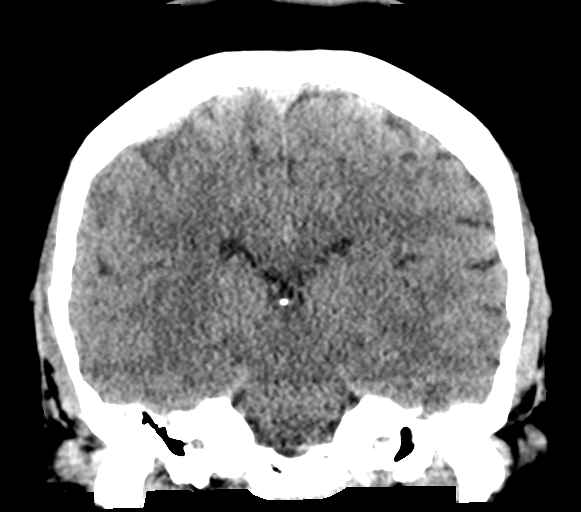
[im 36/64  brain]
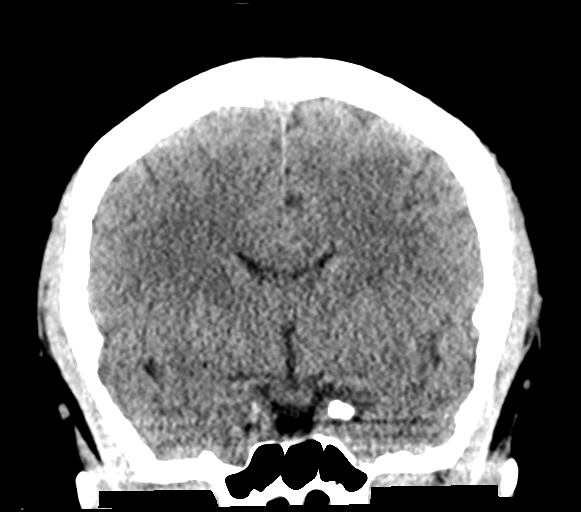

[Series 6: sagittal soft tissue · sagittal · 0.29mm/px · 3 of 57 slices shown]
[im 19/57  brain]
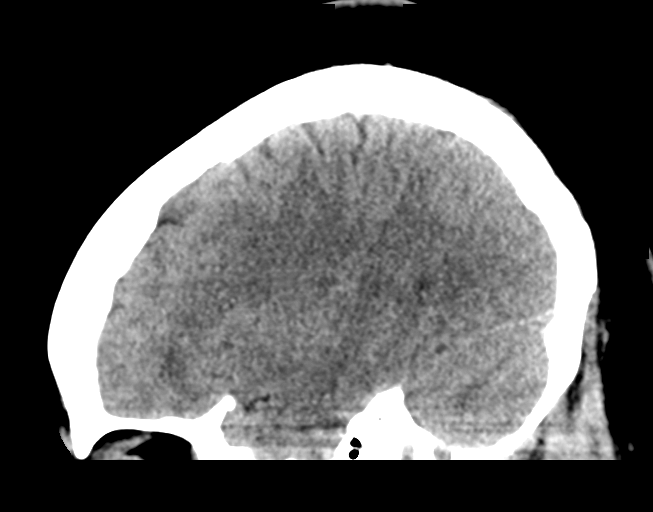
[im 29/57  brain]
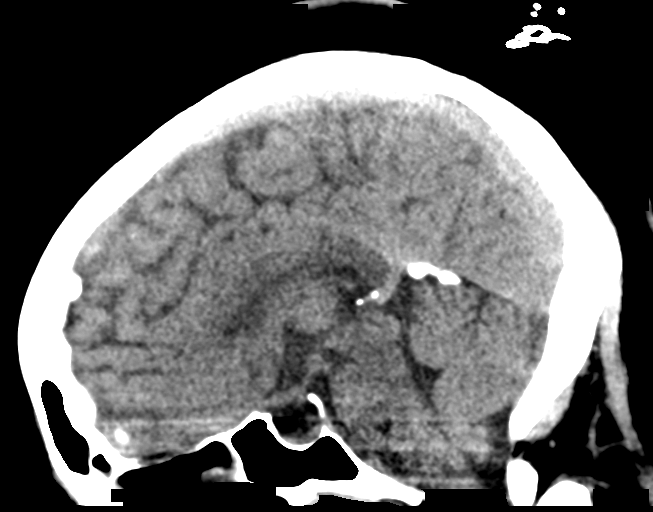
[im 38/57  brain]
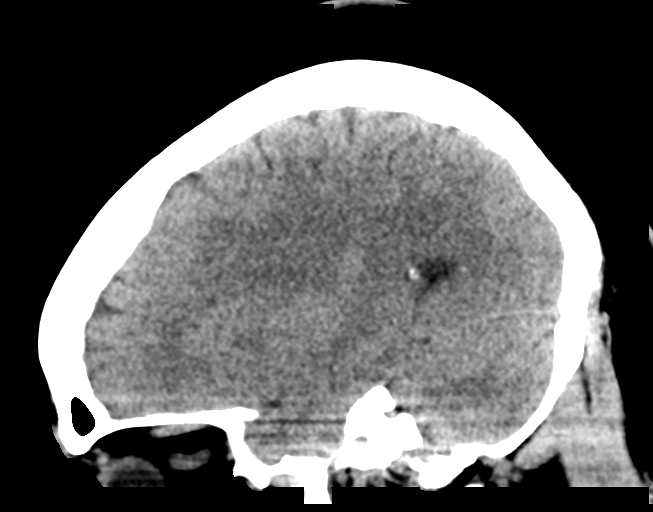

[17 of 47 positions shown; findings below may reference images not displayed]

FINDINGS: Brain: No evidence of acute infarction, hemorrhage, hydrocephalus,
extra-axial collection or mass lesion/mass effect.

Vascular: No hyperdense vessel or unexpected calcification.

Skull: Normal. Negative for fracture or focal lesion.

Sinuses/Orbits: No acute finding.

Other: None.
IMPRESSION: No acute intracranial abnormality seen.

## 2023-02-03 ENCOUNTER — Emergency Department (HOSPITAL_BASED_OUTPATIENT_CLINIC_OR_DEPARTMENT_OTHER): Payer: 59

## 2023-02-03 ENCOUNTER — Emergency Department (HOSPITAL_BASED_OUTPATIENT_CLINIC_OR_DEPARTMENT_OTHER)
Admission: EM | Admit: 2023-02-03 | Discharge: 2023-02-03 | Disposition: A | Discharge status: Home / Self Care | Payer: 59 | Attending: Emergency Medicine | Admitting: Emergency Medicine

## 2023-02-03 ENCOUNTER — Other Ambulatory Visit: Payer: Self-pay

## 2023-02-03 ENCOUNTER — Encounter (HOSPITAL_BASED_OUTPATIENT_CLINIC_OR_DEPARTMENT_OTHER): Payer: Self-pay

## 2023-02-03 DIAGNOSIS — R2991 Unspecified symptoms and signs involving the musculoskeletal system: Secondary | ICD-10-CM | POA: Diagnosis not present

## 2023-02-03 DIAGNOSIS — R0789 Other chest pain: Secondary | ICD-10-CM | POA: Diagnosis not present

## 2023-02-03 DIAGNOSIS — M7918 Myalgia, other site: Secondary | ICD-10-CM

## 2023-02-03 DIAGNOSIS — R0602 Shortness of breath: Secondary | ICD-10-CM | POA: Diagnosis not present

## 2023-02-03 DIAGNOSIS — M25512 Pain in left shoulder: Secondary | ICD-10-CM | POA: Diagnosis not present

## 2023-02-03 DIAGNOSIS — R079 Chest pain, unspecified: Secondary | ICD-10-CM | POA: Diagnosis not present

## 2023-02-03 DIAGNOSIS — M791 Myalgia, unspecified site: Secondary | ICD-10-CM | POA: Diagnosis not present

## 2023-02-03 LAB — D-DIMER, QUANTITATIVE: D-Dimer, Quant: 0.36 ug/mL-FEU (ref 0.00–0.50)

## 2023-02-03 LAB — CBC
HCT: 27.5 % — ABNORMAL LOW (ref 36.0–46.0)
Hemoglobin: 8.8 g/dL — ABNORMAL LOW (ref 12.0–15.0)
MCH: 26 pg (ref 26.0–34.0)
MCHC: 32 g/dL (ref 30.0–36.0)
MCV: 81.4 fL (ref 80.0–100.0)
Platelets: 356 10*3/uL (ref 150–400)
RBC: 3.38 MIL/uL — ABNORMAL LOW (ref 3.87–5.11)
RDW: 14.5 % (ref 11.5–15.5)
WBC: 8.2 10*3/uL (ref 4.0–10.5)
nRBC: 0 % (ref 0.0–0.2)

## 2023-02-03 LAB — BASIC METABOLIC PANEL
Anion gap: 8 (ref 5–15)
BUN: 10 mg/dL (ref 6–20)
CO2: 25 mmol/L (ref 22–32)
Calcium: 9.4 mg/dL (ref 8.9–10.3)
Chloride: 106 mmol/L (ref 98–111)
Creatinine, Ser: 0.92 mg/dL (ref 0.44–1.00)
GFR, Estimated: >60 mL/min (ref >60)
Glucose, Bld: 81 mg/dL (ref 70–99)
Potassium: 3.3 mmol/L — ABNORMAL LOW (ref 3.5–5.1)
Sodium: 139 mmol/L (ref 135–145)

## 2023-02-03 LAB — TROPONIN I (HIGH SENSITIVITY): Troponin I (High Sensitivity): <2 ng/L (ref <18)

## 2023-02-03 MED ORDER — METHOCARBAMOL 500 MG PO TABS: 500.0000 mg | ORAL_TABLET | Freq: Three times a day (TID) | ORAL | 0 refills | Status: AC | PRN

## 2023-02-03 NOTE — ED Triage Notes (Signed)
Patient here POV from Home.  Endorses Left Sided CP for a few days. Began to radiate down left Arm today. Mostly constant.   Some SOB. Nausea today. No Fevers.   NAD noted during Triage. A&Ox4. Gcs 15. Ambulatory.

## 2023-02-03 NOTE — ED Notes (Signed)
Reviewed AVS with patient, patient expressed understanding of directions, denies further questions at this time.

## 2023-02-03 NOTE — ED Notes (Signed)
Patient verbalizes understanding of discharge instructions. Opportunity for questioning and answers were provided. Armband removed by staff, pt discharged from ED. Ambulated out to lobby  

## 2023-02-03 NOTE — ED Provider Notes (Signed)
[facility] Provider Note   CSN: [id number] Arrival date & time: 02/03/23  2106     History  Chief Complaint  Patient presents with   Chest Pain    Nicole [name] is a 47 y.o. female.   Chest Pain Patient presents with chest pain.  Anterior chest.  Left shoulder.  Worse with certain movements.  States go down to the left arm.  Has had the last day or 2.  States is constant.  Worse with certain movements.  Did however have partial hysterectomy around a month ago.  States pain is worse with breathing.  No swelling in her legs.  No cough.     Home Medications Prior to Admission medications   Medication Sig Start Date End Date Taking? Authorizing Provider  methocarbamol (ROBAXIN) 500 MG tablet Take 1 tablet (500 mg total) by mouth every 8 (eight) hours as needed for muscle spasms. 02/03/23  Yes Davonna Belling, MD  AIMOVIG 140 MG/ML SOAJ Inject 140 mg into the skin every 30 (thirty) days. 05/25/21   [provider]  cetirizine (ZYRTEC) 10 MG tablet Take 10 mg by mouth daily.    [provider]  EPINEPHrine 0.3 mg/0.3 mL IJ SOAJ injection Inject 0.3 mg into the muscle as needed for anaphylaxis.    [provider]  fluticasone (FLONASE) 50 MCG/ACT nasal spray Place 2 sprays into both nostrils daily. 12/21/20   [provider]  ibuprofen (ADVIL) 800 MG tablet Take 1 tablet (800 mg total) by mouth 3 (three) times daily. 01/23/23   [name], MD  levETIRAcetam (KEPPRA) 1000 MG tablet Take 1,000-2,000 mg by mouth in the morning, at noon, and at bedtime. 04/08/21   [provider]  montelukast (SINGULAIR) 10 MG tablet Take 1 tablet (10 mg total) by mouth at bedtime. 07/03/19   Cirigliano, Garvin Fila, DO  oxyCODONE-acetaminophen (PERCOCET/ROXICET) 5-325 MG tablet Take 1 tablet by mouth every 4 (four) hours as needed for moderate pain. 01/24/23   [name], MD  SUMAtriptan (IMITREX) 100 MG tablet Take  100 mg by mouth every 2 (two) hours as needed for migraine. 04/08/21   [provider]  ALAYCEN 1/35 tablet TAKE 1 TABLET BY MOUTH EVERY DAY Patient not taking: No sig reported 08/07/19 06/23/21  Shelly Bombard, MD  famotidine (PEPCID) 20 MG tablet Take 1 tablet (20 mg total) by mouth daily. Patient not taking: No sig reported 11/13/19 06/23/21  Ok Edwards, PA-C  ferrous sulfate 325 (65 FE) MG tablet Take 1 tablet (325 mg total) by mouth daily. Patient not taking: No sig reported 12/29/18 06/23/21  Petrucelli, Samantha R, PA-C  ipratropium (ATROVENT) 0.06 % nasal spray Place 2 sprays into both nostrils 4 (four) times daily. Patient not taking: No sig reported 11/13/19 06/23/21  Ok Edwards, PA-C  medroxyPROGESTERone (DEPO-PROVERA) 150 MG/ML injection INJECT 1 ML (150 MG TOTAL) INTO THE MUSCLE EVERY 3 (THREE) MONTHS. Patient not taking: No sig reported 03/03/20 06/23/21  Shelly Bombard, MD  topiramate (TOPAMAX) 25 MG tablet Take 3 tablets (75 mg total) by mouth 2 (two) times daily. Patient not taking: No sig reported 07/07/18 06/23/21  Molpus, Jenny Reichmann, MD      Allergies    Green tea leaf ext, Penicillins, Shellfish allergy, Shrimp extract allergy skin test, Sulfa antibiotics, Tea, and Sulfamethoxazole    Review of Systems   Review of Systems  Cardiovascular:  Positive for chest pain.    Physical Exam  Updated Vital Signs BP (!) 147/111   Pulse 71   Temp 97.6 F (36.4 C) (Oral)   Resp 13   Ht '5\' 5"'$  (1.651 m)   Wt 120.2 kg   LMP 11/07/2022 (Exact Date)   SpO2 100%   BMI 44.10 kg/m  Physical Exam Nursing note reviewed.  Cardiovascular:     Rate and Rhythm: Regular rhythm.  Pulmonary:     Effort: Pulmonary effort is normal.  Musculoskeletal:     Cervical back: Neck supple.  Neurological:     Mental Status: She is alert.     ED Results / Procedures / Treatments   Labs (all labs ordered are listed, but only abnormal results are displayed) Labs Reviewed  BASIC METABOLIC PANEL  - Abnormal; Notable for the following components:      Result Value   Potassium 3.3 (*)    All other components within normal limits  CBC - Abnormal; Notable for the following components:   RBC 3.38 (*)    Hemoglobin 8.8 (*)    HCT 27.5 (*)    All other components within normal limits  D-DIMER, QUANTITATIVE  TROPONIN I (HIGH SENSITIVITY)  TROPONIN I (HIGH SENSITIVITY)    EKG None  Radiology DG Chest Portable 1 View  Result Date: 02/03/2023 CLINICAL DATA:  Acute chest pain and shortness of breath. EXAM: PORTABLE CHEST 1 VIEW COMPARISON:  12/06/2022 and prior studies FINDINGS: Telemetry leads overlie the chest. The cardiomediastinal silhouette is unremarkable. There is no evidence of focal airspace disease, pulmonary edema, suspicious pulmonary nodule/mass, pleural effusion, or pneumothorax. No acute bony abnormalities are identified. IMPRESSION: No active disease. Electronically Signed   By: Margarette Canada M.D.   On: 02/03/2023 21:44    Procedures Procedures    Medications Ordered in ED Medications - No data to display  ED Course/ Medical Decision Making/ A&P                             Medical Decision Making Amount and/or Complexity of Data Reviewed Labs: ordered. Radiology: ordered.  Risk Prescription drug management.   Patient with left-sided chest pain.  More in left shoulder.  It is pleuritic and did have recent surgery.  Negative D-dimer.  Doubt cardiac cause.  Chest x-ray reassuring.  EKG reassuring.  Appears stable for discharge home        Final Clinical Impression(s) / ED Diagnoses Final diagnoses:  Musculoskeletal pain    Rx / DC Orders ED Discharge Orders          Ordered    methocarbamol (ROBAXIN) 500 MG tablet  Every 8 hours PRN        02/03/23 2255              Davonna Belling, MD 02/03/23 2358

## 2023-02-07 ENCOUNTER — Encounter: Payer: Self-pay | Admitting: Obstetrics and Gynecology

## 2023-02-07 ENCOUNTER — Ambulatory Visit (INDEPENDENT_AMBULATORY_CARE_PROVIDER_SITE_OTHER): Payer: 59 | Admitting: Obstetrics and Gynecology

## 2023-02-07 ENCOUNTER — Other Ambulatory Visit (HOSPITAL_COMMUNITY)
Admission: RE | Admit: 2023-02-07 | Discharge: 2023-02-07 | Disposition: A | Discharge status: Home / Self Care | Payer: 59 | Source: Ambulatory Visit | Attending: Obstetrics and Gynecology | Admitting: Obstetrics and Gynecology

## 2023-02-07 VITALS — BP 124/89 | HR 78 | Ht 65.0 in | Wt 278.0 lb

## 2023-02-07 DIAGNOSIS — Z9889 Other specified postprocedural states: Secondary | ICD-10-CM | POA: Diagnosis present

## 2023-02-07 DIAGNOSIS — Z90710 Acquired absence of both cervix and uterus: Secondary | ICD-10-CM | POA: Insufficient documentation

## 2023-02-07 NOTE — Progress Notes (Signed)
Nicole [name] presents for post op appt. S/P [facility] on 01/09/23. Pathology reviewed with pt Denies any bowel or bladder dysfunction Some vaginal discharge,no bleeding  PE AF VSS Chaperone present Lungs clear Heart RRR Abd soft + BS GU Nl EGBUS, cuff healing well, vaginal swab collected, no masses or tenderness  A/P Post op [facility]  Doing well. Vaginal swab collected. Tx as per results. Return to work note provided Increase ADL's as tolerates. F/U in 1 yr or PRN

## 2023-02-07 NOTE — Progress Notes (Signed)
47 y.o GYN presents for Post Op Follow up of Hysterectomy.  C/o vaginal discharge, tingling in arms and hands.

## 2023-02-08 ENCOUNTER — Encounter (HOSPITAL_BASED_OUTPATIENT_CLINIC_OR_DEPARTMENT_OTHER): Payer: Self-pay

## 2023-02-08 ENCOUNTER — Emergency Department (HOSPITAL_BASED_OUTPATIENT_CLINIC_OR_DEPARTMENT_OTHER): Payer: 59 | Admitting: Radiology

## 2023-02-08 ENCOUNTER — Emergency Department (HOSPITAL_BASED_OUTPATIENT_CLINIC_OR_DEPARTMENT_OTHER)
Admission: EM | Admit: 2023-02-08 | Discharge: 2023-02-08 | Disposition: A | Discharge status: Home / Self Care | Payer: 59 | Attending: Emergency Medicine | Admitting: Emergency Medicine

## 2023-02-08 ENCOUNTER — Other Ambulatory Visit: Payer: Self-pay

## 2023-02-08 DIAGNOSIS — Z87891 Personal history of nicotine dependence: Secondary | ICD-10-CM | POA: Insufficient documentation

## 2023-02-08 DIAGNOSIS — S43422A Sprain of left rotator cuff capsule, initial encounter: Secondary | ICD-10-CM | POA: Insufficient documentation

## 2023-02-08 DIAGNOSIS — R9431 Abnormal electrocardiogram [ECG] [EKG]: Secondary | ICD-10-CM | POA: Diagnosis not present

## 2023-02-08 DIAGNOSIS — Z7951 Long term (current) use of inhaled steroids: Secondary | ICD-10-CM | POA: Insufficient documentation

## 2023-02-08 DIAGNOSIS — M25512 Pain in left shoulder: Secondary | ICD-10-CM | POA: Diagnosis not present

## 2023-02-08 DIAGNOSIS — X58XXXA Exposure to other specified factors, initial encounter: Secondary | ICD-10-CM | POA: Diagnosis not present

## 2023-02-08 DIAGNOSIS — J45909 Unspecified asthma, uncomplicated: Secondary | ICD-10-CM | POA: Diagnosis not present

## 2023-02-08 DIAGNOSIS — M542 Cervicalgia: Secondary | ICD-10-CM | POA: Insufficient documentation

## 2023-02-08 DIAGNOSIS — S4992XA Unspecified injury of left shoulder and upper arm, initial encounter: Secondary | ICD-10-CM | POA: Diagnosis not present

## 2023-02-08 MED ORDER — KETOROLAC TROMETHAMINE 15 MG/ML IJ SOLN
15.0000 mg | Freq: Once | INTRAMUSCULAR | Status: AC
  Administered 2023-02-08: 15 mg via INTRAMUSCULAR
  Filled 2023-02-08: qty 1

## 2023-02-08 NOTE — ED Provider Notes (Signed)
[facility] Provider Note  CSN: [id number] Arrival date & time: 02/08/23 [id number]  Chief Complaint(s) Shoulder Pain  HPI Nicole [name] is a 47 y.o. female history of rheumatoid arthritis, seizure disorder presenting to the emergency department with left shoulder pain.  She reports it began after she picked up her grandson.  She was seen in the emergency department a couple days ago and given Robaxin, she has been taking this without improvement.  She reports sensation of tingling in her fingertips.  She also reports some left neck pain.  No headaches, numbness or tingling elsewhere, weakness, fevers or chills, chest pain, shortness of breath.  She reports she is taking ibuprofen and Tylenol.   Past Medical History Past Medical History:  Diagnosis Date   Anemia    Asthma    Atypical chest pain    a. ?pericarditis 2015.   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 Authorizing Provider  AIMOVIG 140 MG/ML SOAJ Inject 140 mg into the skin every 30 (thirty) days. 05/25/21   [provider]  cetirizine (ZYRTEC) 10 MG tablet Take 10 mg by mouth daily.    [provider]  EPINEPHrine 0.3 mg/0.3 mL IJ  SOAJ injection Inject 0.3 mg into the muscle as needed for anaphylaxis.    [provider]  fluticasone (FLONASE) 50 MCG/ACT nasal spray Place 2 sprays into both nostrils daily. 12/21/20   [provider]  ibuprofen (ADVIL) 800 MG tablet Take 1 tablet (800 mg total) by mouth 3 (three) times daily. 01/23/23   [name], MD  levETIRAcetam (KEPPRA) 1000 MG tablet Take 1,000-2,000 mg by mouth in the morning, at noon, and at bedtime. 04/08/21   [provider]  methocarbamol (ROBAXIN) 500 MG tablet Take 1 tablet (500 mg total) by mouth every 8 (eight) hours as needed for muscle spasms. 02/03/23   Davonna Belling, MD  montelukast (SINGULAIR) 10 MG tablet Take 1 tablet (10 mg total) by mouth at bedtime. 07/03/19   Cirigliano, Garvin Fila, DO  oxyCODONE-acetaminophen (PERCOCET/ROXICET) 5-325 MG tablet Take 1 tablet by mouth every 4 (four) hours as needed for moderate pain. 01/24/23   [name], MD  SUMAtriptan (IMITREX) 100 MG tablet Take 100 mg by mouth every 2 (two) hours as needed for migraine. 04/08/21   [provider]  ALAYCEN 1/35 tablet TAKE 1 TABLET BY MOUTH EVERY DAY Patient not taking: No sig reported 08/07/19 06/23/21  Shelly Bombard, MD  famotidine (PEPCID) 20 MG tablet Take 1 tablet (20 mg total) by mouth daily. Patient not taking: No sig reported 11/13/19 06/23/21  Ok Edwards, PA-C  ferrous sulfate 325 (  65 FE) MG tablet Take 1 tablet (325 mg total) by mouth daily. Patient not taking: No sig reported 12/29/18 06/23/21  Petrucelli, Samantha R, PA-C  ipratropium (ATROVENT) 0.06 % nasal spray Place 2 sprays into both nostrils 4 (four) times daily. Patient not taking: No sig reported 11/13/19 06/23/21  Ok Edwards, PA-C  medroxyPROGESTERone (DEPO-PROVERA) 150 MG/ML injection INJECT 1 ML (150 MG TOTAL) INTO THE MUSCLE EVERY 3 (THREE) MONTHS. Patient not taking: No sig reported 03/03/20 06/23/21  Shelly Bombard, MD  topiramate (TOPAMAX) 25 MG tablet Take 3 tablets  (75 mg total) by mouth 2 (two) times daily. Patient not taking: No sig reported 07/07/18 06/23/21  Molpus, Jenny Reichmann, MD                                                                                                                                    Past Surgical History Past Surgical History:  Procedure Laterality Date   TUBAL LIGATION Bilateral 2005   VAGINAL HYSTERECTOMY N/A 01/09/2023   Procedure: HYSTERECTOMY VAGINAL;  Surgeon: [name], MD;  Location: [facility];  Service: Gynecology;  Laterality: N/A;   Family History Family History  Problem Relation Age of Onset   Hypertension Mother    Diabetes Mother    Cancer Mother        Colon, brain, 2 other   Asthma Mother    Clotting disorder Mother    Heart disease Mother        39, stents   Stroke Mother        2012   Breast cancer Mother 35   Diabetes Father    Asthma Father    Clotting disorder Father    Sickle cell anemia Father    Heart disease Father        2000, stents   Hypertension Father    Stroke Father        2007   Diabetes Maternal Grandmother    Breast cancer Maternal Grandmother    Asthma Sister    Asthma Brother    Asthma Paternal Grandmother    Breast cancer Maternal Grandfather    Anesthesia problems Neg Hx     Social History Social History   Tobacco Use   Smoking status: Former    Types: Cigarettes    Quit date: 03/27/2014    Years since quitting: 8.8    Passive exposure: Past   Smokeless tobacco: Never  Vaping Use   Vaping Use: Never used  Substance Use Topics   Alcohol use: No   Drug use: No   Allergies Green tea leaf ext, Penicillins, Shellfish allergy, Shrimp extract allergy skin test, Sulfa antibiotics, Tea, and Sulfamethoxazole  Review of Systems Review of Systems  All other systems reviewed and are negative.   Physical Exam Vital Signs  I have reviewed the triage vital signs BP 111/76   Pulse 70   Temp 98.5 F (36.9 C) (  Oral)   Resp (!) 21   LMP 11/07/2022 (Exact Date)    SpO2 97%  Physical Exam Vitals and nursing note reviewed.  Constitutional:      General: She is not in acute distress.    Appearance: She is well-developed.  HENT:     Head: Normocephalic and atraumatic.     Mouth/Throat:     Mouth: Mucous membranes are moist.  Eyes:     Pupils: Pupils are equal, round, and reactive to light.  Cardiovascular:     Rate and Rhythm: Normal rate and regular rhythm.     Pulses:          Radial pulses are 2+ on the right side and 2+ on the left side.     Heart sounds: No murmur heard. Pulmonary:     Effort: Pulmonary effort is normal. No respiratory distress.     Breath sounds: Normal breath sounds.  Abdominal:     General: Abdomen is flat.     Palpations: Abdomen is soft.     Tenderness: There is no abdominal tenderness.  Musculoskeletal:        General: No tenderness.     Right lower leg: No edema.     Left lower leg: No edema.     Comments: Slightly painful range of motion of the left shoulder, range of motion intact  Skin:    General: Skin is warm and dry.  Neurological:     General: No focal deficit present.     Mental Status: She is alert. Mental status is at baseline.     Comments: Strength 5 out of 5 in the bilateral upper and bilateral lower extremities.  Cranial nerves II through XII intact.  Reports decreased sensation in the fingertips of the left hand but otherwise normal sensation and no objective deficits  Psychiatric:        Mood and Affect: Mood normal.        Behavior: Behavior normal.     ED Results and Treatments Labs (all labs ordered are listed, but only abnormal results are displayed) Labs Reviewed - No data to display                                                                                                                        Radiology DG Shoulder Left  Result Date: 02/08/2023 CLINICAL DATA:  Left shoulder pain and numbness. EXAM: LEFT SHOULDER - 2+ VIEW COMPARISON:  Left shoulder radiographs 04/07/2012.  FINDINGS: Three views. There is no evidence of fracture or dislocation. Joint space is maintained. Mild rotator cuff enthesopathy on the greater tubercle. Soft tissues are unremarkable. IMPRESSION: No acute fracture or dislocation. Electronically Signed   By: Emmit Alexanders M.D.   On: 02/08/2023 09:09    Pertinent labs & imaging results that were available during my care of the patient were reviewed by me and considered in my medical decision making (see MDM for details).  Medications Ordered in ED Medications  ketorolac (  TORADOL) 15 MG/ML injection 15 mg (15 mg Intramuscular Given 02/08/23 0851)                                                                                                                                     Procedures Procedures  (including critical care time)  Medical Decision Making / ED Course   MDM:  47 year old female presenting to the emergency department with left shoulder pain.  Suspect musculoskeletal pain in the setting of picking up her grandson.  Likely rotator cuff sprain.  Given tingling sensation could also represent cervical radiculopathy.  Patient has no objective neurologic deficits to suggest intracranial process or stroke.  Doubt DVT.  Patient seen here in the emergency department for similar pain few days ago and had negative D-dimer and no objective swelling on exam.  Peripheral pulses normal.  Will treat with Toradol, obtain shoulder x-ray.  Likely discharge with improvement pain control.  Clinical Course as of 02/08/23 0948  Thu Feb 08, 2023  [id number] Symptoms improved with Toradol.  X-ray does show some evidence of rotator cuff arthropathy.  Suspect this is likely the source of her pain.  Advised follow-up with orthopedic surgery. Will discharge patient to home. All questions answered. Patient comfortable with plan of discharge. Return precautions discussed with patient and specified on the after visit summary. [WS]    Clinical Course User  Index [WS] Cristie Hem, MD     Additional history obtained: -Additional history obtained from family -External records from outside source obtained and reviewed including: Chart review including previous notes, labs, imaging, consultation notes including ED note from a few days ago for similar symptoms   EKG   EKG Interpretation  Date/Time:  Thursday February 08 2023 08:04:45 EST Ventricular Rate:  73 PR Interval:  167 QRS Duration: 88 QT Interval:  396 QTC Calculation: 437 R Axis:   33 Text Interpretation: Sinus rhythm Confirmed by Garnette Gunner [phone]) on 02/08/2023 8:33:26 AM         Imaging Studies ordered: I ordered imaging studies including XR shoulder On my interpretation imaging demonstrates rotator cuff arthropathy I independently visualized and interpreted imaging. I agree with the radiologist interpretation   Medicines ordered and prescription drug management: Meds ordered this encounter  Medications   ketorolac (TORADOL) 15 MG/ML injection 15 mg    -I have reviewed the patients home medicines and have made adjustments as needed   Social Determinants of Health:  Diagnosis or treatment significantly limited by social determinants of health: obesity   Reevaluation: After the interventions noted above, I reevaluated the patient and found that their symptoms have improved  Co morbidities that complicate the patient evaluation  Past Medical History:  Diagnosis Date   Anemia    Asthma    Atypical chest pain    a. ?pericarditis 2015.   Blood transfusion 2011   r/t anemia   Fibroid    Migraine  Rheumatoid arthritis ([facility])    Seizures ([facility])    Sickle cell trait ([place])    Stroke [facility]) 2008   Tooth abscess       Dispostion: Disposition decision including need for hospitalization was considered, and patient discharged from emergency department.    Final Clinical Impression(s) / ED Diagnoses Final diagnoses:  Sprain of left rotator  cuff capsule, initial encounter     This chart was dictated using voice recognition software.  Despite best efforts to proofread,  errors can occur which can change the documentation meaning.    Cristie Hem, MD 02/08/23 [phone]

## 2023-02-08 NOTE — ED Notes (Signed)
Patient verbalizes understanding of discharge instructions. Opportunity for questioning and answers were provided. Patient discharged from ED.  °

## 2023-02-08 NOTE — Discharge Instructions (Addendum)
We evaluated you for your shoulder pain.  Your symptoms are likely due to rotator cuff sprain.  Please take Tylenol and Motrin for your symptoms at home.  You can take 1000 mg of Tylenol every 6 hours and 600 mg of ibuprofen every 6 hours as needed for your symptoms.  You can take these medicines together as needed, either at the same time, or alternating every 3 hours.  Please call orthopedic surgery for a follow-up appointment.  They will reassess you and decide whether you need to be referred for physical therapy.  I have attached some exercises which she can try at home to help improve your shoulder strength.  Please return if you develop any new or worsening symptoms, severe pain, weakness, worsening tingling, or any other concerning symptoms.

## 2023-02-08 NOTE — ED Triage Notes (Addendum)
Pt presents POV from home, with family, ambulatory   Pt presents with ongoing Left shoulder pain and swelling. Seen here 2/24 for the same, prescribed Robaxin w/no relief. Pt has completed that dose of Robaxin. Pt reports her fingers feel numb. Pt reports hx of stroke.  Pt denies no injury, able to move her LUE, LLE but reports increase pain with movement. Pt was diagnosed with Sciatica    No neuro deficits noted in triage  LKW 02/03/2023

## 2023-02-09 DIAGNOSIS — M25512 Pain in left shoulder: Secondary | ICD-10-CM | POA: Diagnosis not present

## 2023-02-09 DIAGNOSIS — M542 Cervicalgia: Secondary | ICD-10-CM | POA: Diagnosis not present

## 2023-02-09 LAB — CERVICOVAGINAL ANCILLARY ONLY
Bacterial Vaginitis (gardnerella): NEGATIVE
Candida Glabrata: NEGATIVE
Candida Vaginitis: POSITIVE — AB
Chlamydia: NEGATIVE
Comment: NEGATIVE
Comment: NEGATIVE
Comment: NEGATIVE
Comment: NEGATIVE
Comment: NEGATIVE
Comment: NORMAL
Neisseria Gonorrhea: NEGATIVE
Trichomonas: NEGATIVE

## 2023-02-12 ENCOUNTER — Encounter (HOSPITAL_BASED_OUTPATIENT_CLINIC_OR_DEPARTMENT_OTHER): Payer: Self-pay | Admitting: Emergency Medicine

## 2023-02-12 ENCOUNTER — Other Ambulatory Visit: Payer: Self-pay

## 2023-02-12 ENCOUNTER — Emergency Department (HOSPITAL_BASED_OUTPATIENT_CLINIC_OR_DEPARTMENT_OTHER)
Admission: EM | Admit: 2023-02-12 | Discharge: 2023-02-12 | Discharge status: Against Medical Advice | Payer: Medicaid Other | Attending: Emergency Medicine | Admitting: Emergency Medicine

## 2023-02-12 DIAGNOSIS — M79602 Pain in left arm: Secondary | ICD-10-CM | POA: Insufficient documentation

## 2023-02-12 DIAGNOSIS — R202 Paresthesia of skin: Secondary | ICD-10-CM | POA: Insufficient documentation

## 2023-02-12 DIAGNOSIS — B379 Candidiasis, unspecified: Secondary | ICD-10-CM

## 2023-02-12 DIAGNOSIS — Z5321 Procedure and treatment not carried out due to patient leaving prior to being seen by health care provider: Secondary | ICD-10-CM | POA: Insufficient documentation

## 2023-02-12 DIAGNOSIS — M7989 Other specified soft tissue disorders: Secondary | ICD-10-CM | POA: Diagnosis not present

## 2023-02-12 MED ORDER — FLUCONAZOLE 150 MG PO TABS: 150.0000 mg | ORAL_TABLET | Freq: Once | ORAL | 0 refills | Status: AC

## 2023-02-12 NOTE — ED Notes (Signed)
Patient states needs to leave as have MRI appointment.  Signed out AMA

## 2023-02-12 NOTE — ED Triage Notes (Signed)
Ongoing L arm pain, tingling, swelling. States she has been seen multiple times. Reports no relief with toradol, prednisone or muscle relaxer. States this started following her hysterectomy.

## 2023-02-14 ENCOUNTER — Other Ambulatory Visit: Payer: Self-pay | Admitting: Obstetrics and Gynecology

## 2023-02-14 DIAGNOSIS — Z1231 Encounter for screening mammogram for malignant neoplasm of breast: Secondary | ICD-10-CM

## 2023-02-14 DIAGNOSIS — M25512 Pain in left shoulder: Secondary | ICD-10-CM | POA: Diagnosis not present

## 2023-03-05 ENCOUNTER — Emergency Department (HOSPITAL_BASED_OUTPATIENT_CLINIC_OR_DEPARTMENT_OTHER)
Admission: EM | Admit: 2023-03-05 | Discharge: 2023-03-05 | Disposition: A | Discharge status: Home / Self Care | Payer: 59 | Attending: Emergency Medicine | Admitting: Emergency Medicine

## 2023-03-05 ENCOUNTER — Other Ambulatory Visit (HOSPITAL_BASED_OUTPATIENT_CLINIC_OR_DEPARTMENT_OTHER): Payer: Self-pay

## 2023-03-05 ENCOUNTER — Encounter (HOSPITAL_BASED_OUTPATIENT_CLINIC_OR_DEPARTMENT_OTHER): Payer: Self-pay | Admitting: Emergency Medicine

## 2023-03-05 ENCOUNTER — Other Ambulatory Visit: Payer: Self-pay

## 2023-03-05 DIAGNOSIS — M25512 Pain in left shoulder: Secondary | ICD-10-CM | POA: Diagnosis not present

## 2023-03-05 DIAGNOSIS — M79662 Pain in left lower leg: Secondary | ICD-10-CM | POA: Insufficient documentation

## 2023-03-05 DIAGNOSIS — M542 Cervicalgia: Secondary | ICD-10-CM | POA: Diagnosis present

## 2023-03-05 DIAGNOSIS — M79602 Pain in left arm: Secondary | ICD-10-CM

## 2023-03-05 MED ORDER — OXYCODONE HCL 5 MG PO TABS
5.0000 mg | ORAL_TABLET | Freq: Four times a day (QID) | ORAL | 0 refills | Status: DC | PRN
  Filled 2023-03-05: qty 20, 5d supply, fill #0

## 2023-03-05 MED ORDER — CYCLOBENZAPRINE HCL 10 MG PO TABS
10.0000 mg | ORAL_TABLET | Freq: Once | ORAL | Status: AC
  Administered 2023-03-05: 10 mg via ORAL
  Filled 2023-03-05: qty 1

## 2023-03-05 MED ORDER — CYCLOBENZAPRINE HCL 10 MG PO TABS
10.0000 mg | ORAL_TABLET | Freq: Two times a day (BID) | ORAL | 0 refills | Status: AC | PRN
  Filled 2023-03-05: qty 20, 10d supply, fill #0

## 2023-03-05 MED ORDER — METHYLPREDNISOLONE 4 MG PO TBPK
ORAL_TABLET | ORAL | 0 refills | Status: DC
  Filled 2023-03-05: qty 21, 6d supply, fill #0

## 2023-03-05 MED ORDER — OXYCODONE HCL 5 MG PO TABS
5.0000 mg | ORAL_TABLET | Freq: Once | ORAL | Status: AC
  Administered 2023-03-05: 5 mg via ORAL
  Filled 2023-03-05: qty 1

## 2023-03-05 NOTE — ED Provider Notes (Signed)
[facility] Provider Note   CSN: [id number] Arrival date & time: 03/05/23  1122     History  Chief Complaint  Patient presents with   Numbness    Nicole [name] is a 47 y.o. female.  Patient here with ongoing pain into her left shoulder, left side of neck.  She has seen both orthopedics and neurosurgery for this.  She has a pinched nerve at C7 and also may be some rotator cuff issues.  She has been on multiple pain medications and steroids in the past.  She supposed to get maybe a pain injection in the neck here soon.  They are try to hold off on surgery.  She has not been able to tolerate physical therapy due to pain.  She has ongoing paresthesias and tingling.  Increasing muscle spasms.  Denies any new trauma.  The history is provided by the patient.       Home Medications Prior to Admission medications   Medication Sig Start Date End Date Taking? Authorizing Provider  cyclobenzaprine (FLEXERIL) 10 MG tablet Take 1 tablet (10 mg total) by mouth 2 (two) times daily as needed for muscle spasms. 03/05/23  Yes Kimberlyann Hollar, DO  methylPREDNISolone (MEDROL DOSEPAK) 4 MG TBPK tablet Follow package insert 03/05/23  Yes Jacobey Gura, DO  oxyCODONE (ROXICODONE) 5 MG immediate release tablet Take 1 tablet (5 mg total) by mouth every 6 (six) hours as needed for up to 20 doses for breakthrough pain. 03/05/23  Yes Amori Cooperman, DO  AIMOVIG 140 MG/ML SOAJ Inject 140 mg into the skin every 30 (thirty) days. 05/25/21   [provider]  cetirizine (ZYRTEC) 10 MG tablet Take 10 mg by mouth daily.    [provider]  EPINEPHrine 0.3 mg/0.3 mL IJ SOAJ injection Inject 0.3 mg into the muscle as needed for anaphylaxis.    [provider]  fluticasone (FLONASE) 50 MCG/ACT nasal spray Place 2 sprays into both nostrils daily. 12/21/20   [provider]  ibuprofen (ADVIL) 800 MG tablet Take 1 tablet (800 mg total) by mouth 3  (three) times daily. 01/23/23   [name], MD  levETIRAcetam (KEPPRA) 1000 MG tablet Take 1,000-2,000 mg by mouth in the morning, at noon, and at bedtime. 04/08/21   [provider]  methocarbamol (ROBAXIN) 500 MG tablet Take 1 tablet (500 mg total) by mouth every 8 (eight) hours as needed for muscle spasms. 02/03/23   Davonna Belling, MD  montelukast (SINGULAIR) 10 MG tablet Take 1 tablet (10 mg total) by mouth at bedtime. 07/03/19   Cirigliano, Garvin Fila, DO  oxyCODONE-acetaminophen (PERCOCET/ROXICET) 5-325 MG tablet Take 1 tablet by mouth every 4 (four) hours as needed for moderate pain. 01/24/23   [name], MD  SUMAtriptan (IMITREX) 100 MG tablet Take 100 mg by mouth every 2 (two) hours as needed for migraine. 04/08/21   [provider]  ALAYCEN 1/35 tablet TAKE 1 TABLET BY MOUTH EVERY DAY Patient not taking: No sig reported 08/07/19 06/23/21  Shelly Bombard, MD  famotidine (PEPCID) 20 MG tablet Take 1 tablet (20 mg total) by mouth daily. Patient not taking: No sig reported 11/13/19 06/23/21  Ok Edwards, PA-C  ferrous sulfate 325 (65 FE) MG tablet Take 1 tablet (325 mg total) by mouth daily. Patient not taking: No sig reported 12/29/18 06/23/21  Petrucelli, Samantha R, PA-C  ipratropium (ATROVENT) 0.06 % nasal spray Place 2 sprays into both nostrils 4 (four) times  daily. Patient not taking: No sig reported 11/13/19 06/23/21  Ok Edwards, PA-C  medroxyPROGESTERone (DEPO-PROVERA) 150 MG/ML injection INJECT 1 ML (150 MG TOTAL) INTO THE MUSCLE EVERY 3 (THREE) MONTHS. Patient not taking: No sig reported 03/03/20 06/23/21  Shelly Bombard, MD  topiramate (TOPAMAX) 25 MG tablet Take 3 tablets (75 mg total) by mouth 2 (two) times daily. Patient not taking: No sig reported 07/07/18 06/23/21  Molpus, Jenny Reichmann, MD      Allergies    Green tea leaf ext, Penicillins, Shellfish allergy, Shrimp extract, Sulfa antibiotics, Tea, and Sulfamethoxazole    Review of Systems   Review of  Systems  Physical Exam Updated Vital Signs BP 133/85 (BP Location: Right Arm)   Pulse 85   Temp 97.8 F (36.6 C)   Resp (!) 22   Ht 5\' 5"  (1.651 m)   Wt 126.1 kg   LMP 11/07/2022 (Exact Date)   SpO2 98%   BMI 46.26 kg/m  Physical Exam Vitals and nursing note reviewed.  Constitutional:      General: She is not in acute distress.    Appearance: She is well-developed. She is not ill-appearing.  HENT:     Head: Normocephalic and atraumatic.     Nose: Nose normal.     Mouth/Throat:     Mouth: Mucous membranes are moist.  Eyes:     Extraocular Movements: Extraocular movements intact.     Conjunctiva/sclera: Conjunctivae normal.     Pupils: Pupils are equal, round, and reactive to light.  Cardiovascular:     Rate and Rhythm: Normal rate and regular rhythm.     Pulses: Normal pulses.     Heart sounds: No murmur heard. Pulmonary:     Effort: Pulmonary effort is normal. No respiratory distress.     Breath sounds: Normal breath sounds.  Abdominal:     Palpations: Abdomen is soft.     Tenderness: There is no abdominal tenderness.  Musculoskeletal:        General: Tenderness present. No swelling. Normal range of motion.     Cervical back: Normal range of motion and neck supple. Tenderness present.     Comments: Tenderness in the left shoulder  Skin:    General: Skin is warm and dry.     Capillary Refill: Capillary refill takes less than 2 seconds.  Neurological:     General: No focal deficit present.     Mental Status: She is alert and oriented to person, place, and time.     Cranial Nerves: No cranial nerve deficit.     Sensory: No sensory deficit.     Motor: No weakness.     Coordination: Coordination normal.     Comments: 5+ out of 5 strength throughout, normal sensation, normal speech  Psychiatric:        Mood and Affect: Mood normal.     ED Results / Procedures / Treatments   Labs (all labs ordered are listed, but only abnormal results are displayed) Labs  Reviewed - No data to display  EKG None  Radiology No results found.  Procedures Procedures    Medications Ordered in ED Medications  oxyCODONE (Oxy IR/ROXICODONE) immediate release tablet 5 mg (5 mg Oral Given 03/05/23 1214)  cyclobenzaprine (FLEXERIL) tablet 10 mg (10 mg Oral Given 03/05/23 1214)    ED Course/ Medical Decision Making/ A&P  Medical Decision Making Risk Prescription drug management.   Ollen Gross is here with ongoing neck pain and shoulder pain on the left.  Normal vitals.  No fever.  History of pinched nerve at C7 as well as may be rotator cuff issues in the left shoulder.  She has follow-up both with orthopedics and neurosurgery.  Ultimately she is neurovascular neuromuscular intact on exam.  She is very tender in the left trapezius, paraspinal cervical muscles as well as left shoulder.  No midline spinal tenderness.  No strength or sensation issues in the left upper extremity.  She appears well.  Will put her on Medrol Dosepak, Flexeril, oxycodone for breakthrough pain.  Recommend continued use of Tylenol.  Will have her follow-up with her specialist.  Discharged in good condition.  This chart was dictated using voice recognition software.  Despite best efforts to proofread,  errors can occur which can change the documentation meaning.         Final Clinical Impression(s) / ED Diagnoses Final diagnoses:  Pain of left upper extremity  Neck pain    Rx / DC Orders ED Discharge Orders          Ordered    cyclobenzaprine (FLEXERIL) 10 MG tablet  2 times daily PRN        03/05/23 1226    oxyCODONE (ROXICODONE) 5 MG immediate release tablet  Every 6 hours PRN        03/05/23 1226    methylPREDNISolone (MEDROL DOSEPAK) 4 MG TBPK tablet        03/05/23 1227              Zahirah Cheslock, DO 03/05/23 1229

## 2023-03-05 NOTE — Discharge Instructions (Signed)
Take Medrol Dosepak as prescribed.  I have prescribed you Flexeril which is a muscle relaxant and oxycodone which is a narcotic pain medicine.  These medications are sedating.  Do not mix with alcohol or drugs or dangerous activities including driving.  Follow-up with both your orthopedic doctor and your neurosurgeon.

## 2023-03-05 NOTE — ED Triage Notes (Signed)
Pt returns for ongoing left arm pain, tingling, numbness; states she has had no relief with recent prescriptions. Pt followed up with surgery, has not heard back from them. Pt alert & oriented, nad noted.

## 2023-03-25 IMAGING — CR DG KNEE COMPLETE 4+V*R*
4 series · 4 of 4 positions shown · non-contrast
Comparison: Right knee x-ray 12/16/2018.

CLINICAL DATA: Pain, knee swelling.

EXAM:
RIGHT KNEE - COMPLETE 4+ VIEW

[t knee ap right]
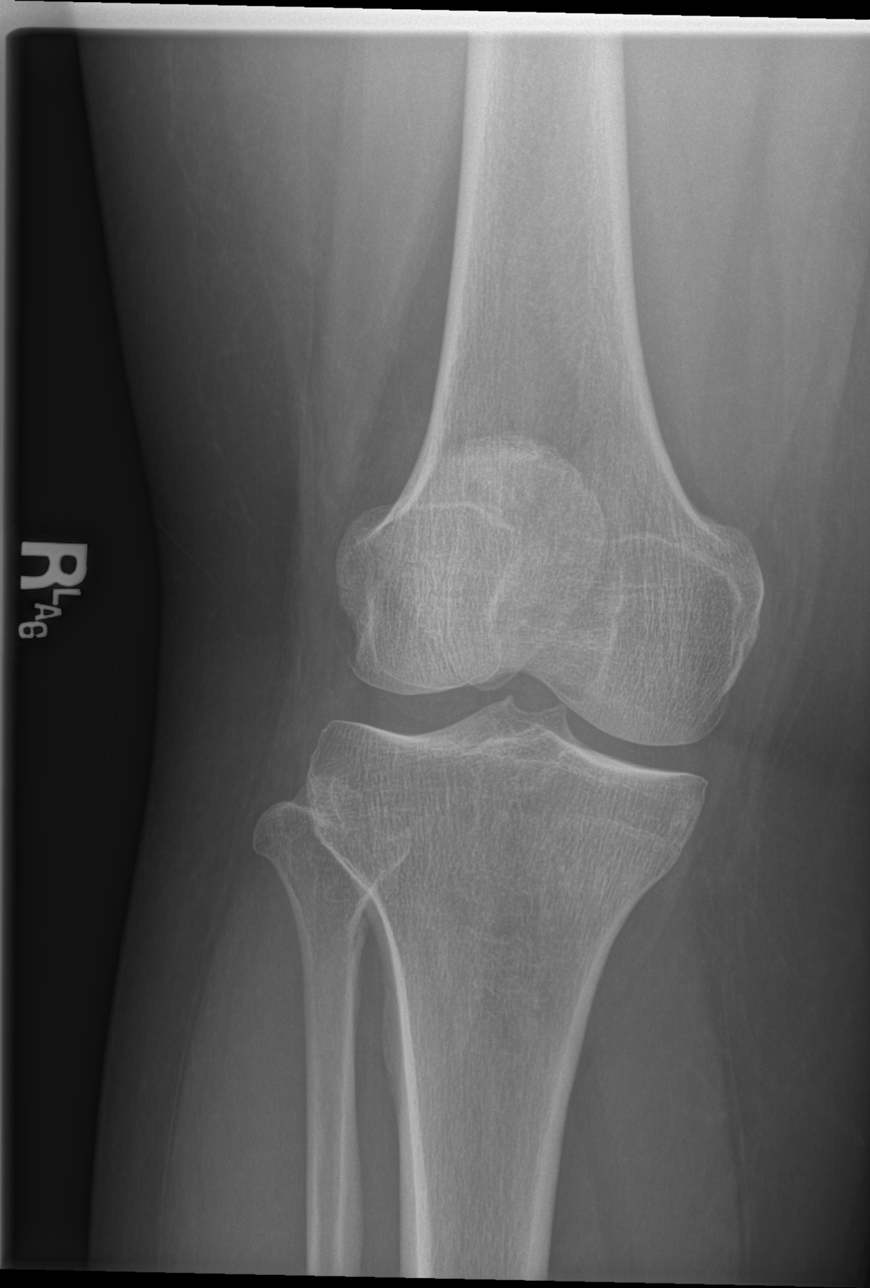

[t knee obl right (1 of 2)]
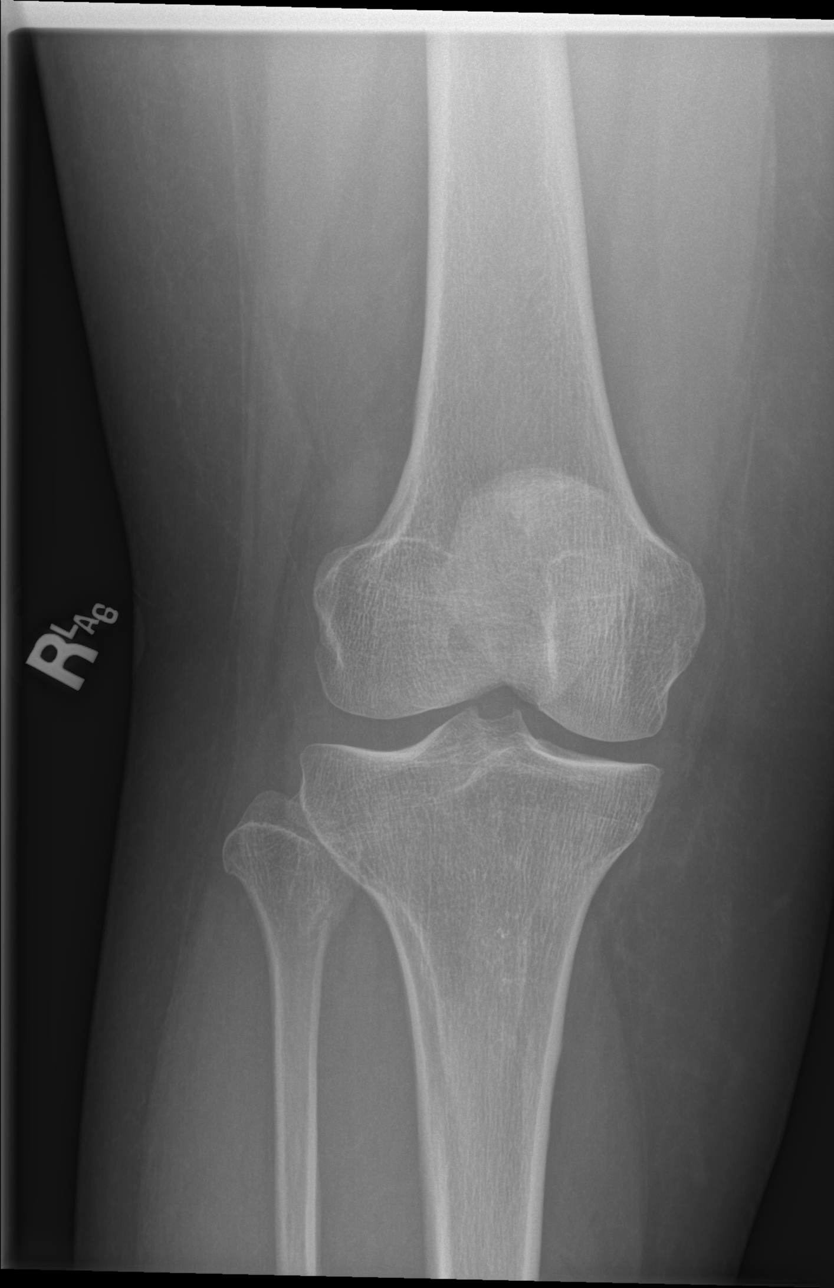

[t knee obl right (2 of 2)]
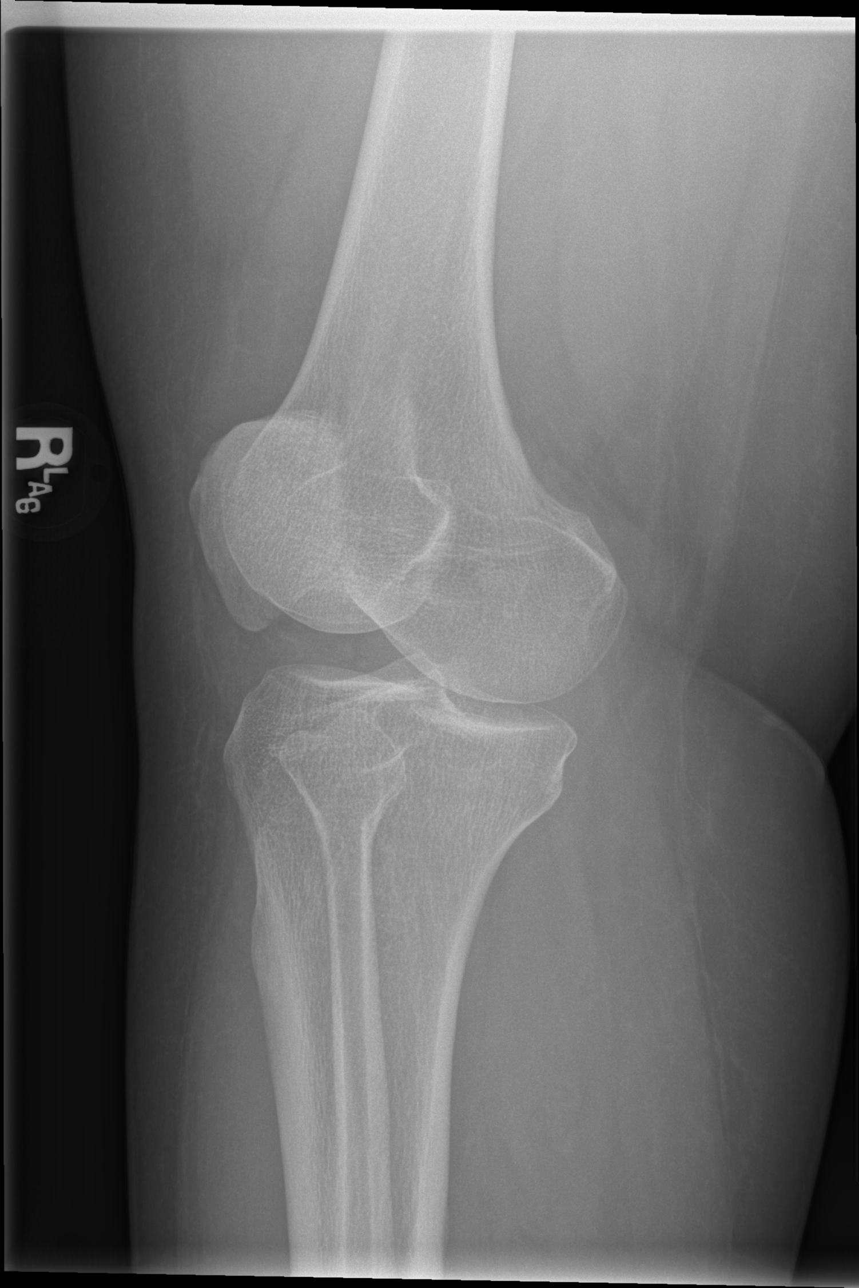

[t knee lat right]
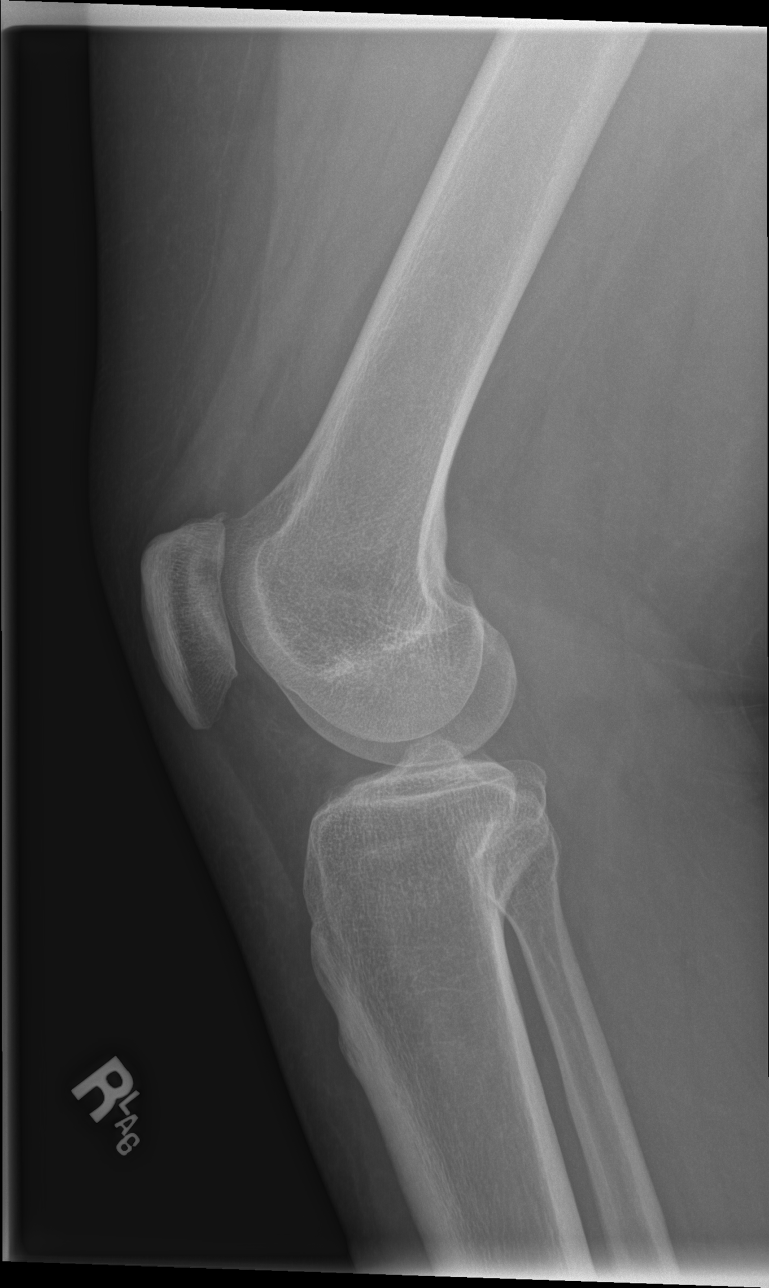

[4 of 4 positions shown; findings below may reference images not displayed]

FINDINGS: No evidence of fracture, dislocation, or joint effusion. Mild
patellofemoral compartment osteophyte formation is unchanged. Joint
spaces are otherwise within normal limits. Soft tissues are
unremarkable.
IMPRESSION: 1. No acute bony abnormality.
2. Stable mild early degenerative changes of the patellofemoral
compartment.

## 2023-03-28 ENCOUNTER — Inpatient Hospital Stay: Admission: RE | Admit: 2023-03-28 | Payer: 59 | Source: Ambulatory Visit

## 2023-05-18 IMAGING — DX DG CHEST 2V
2 series · 2 of 2 positions shown · non-contrast
Comparison: 06/22/2021.

CLINICAL DATA: chest pain

EXAM:
CHEST - 2 VIEW

[chest pa]
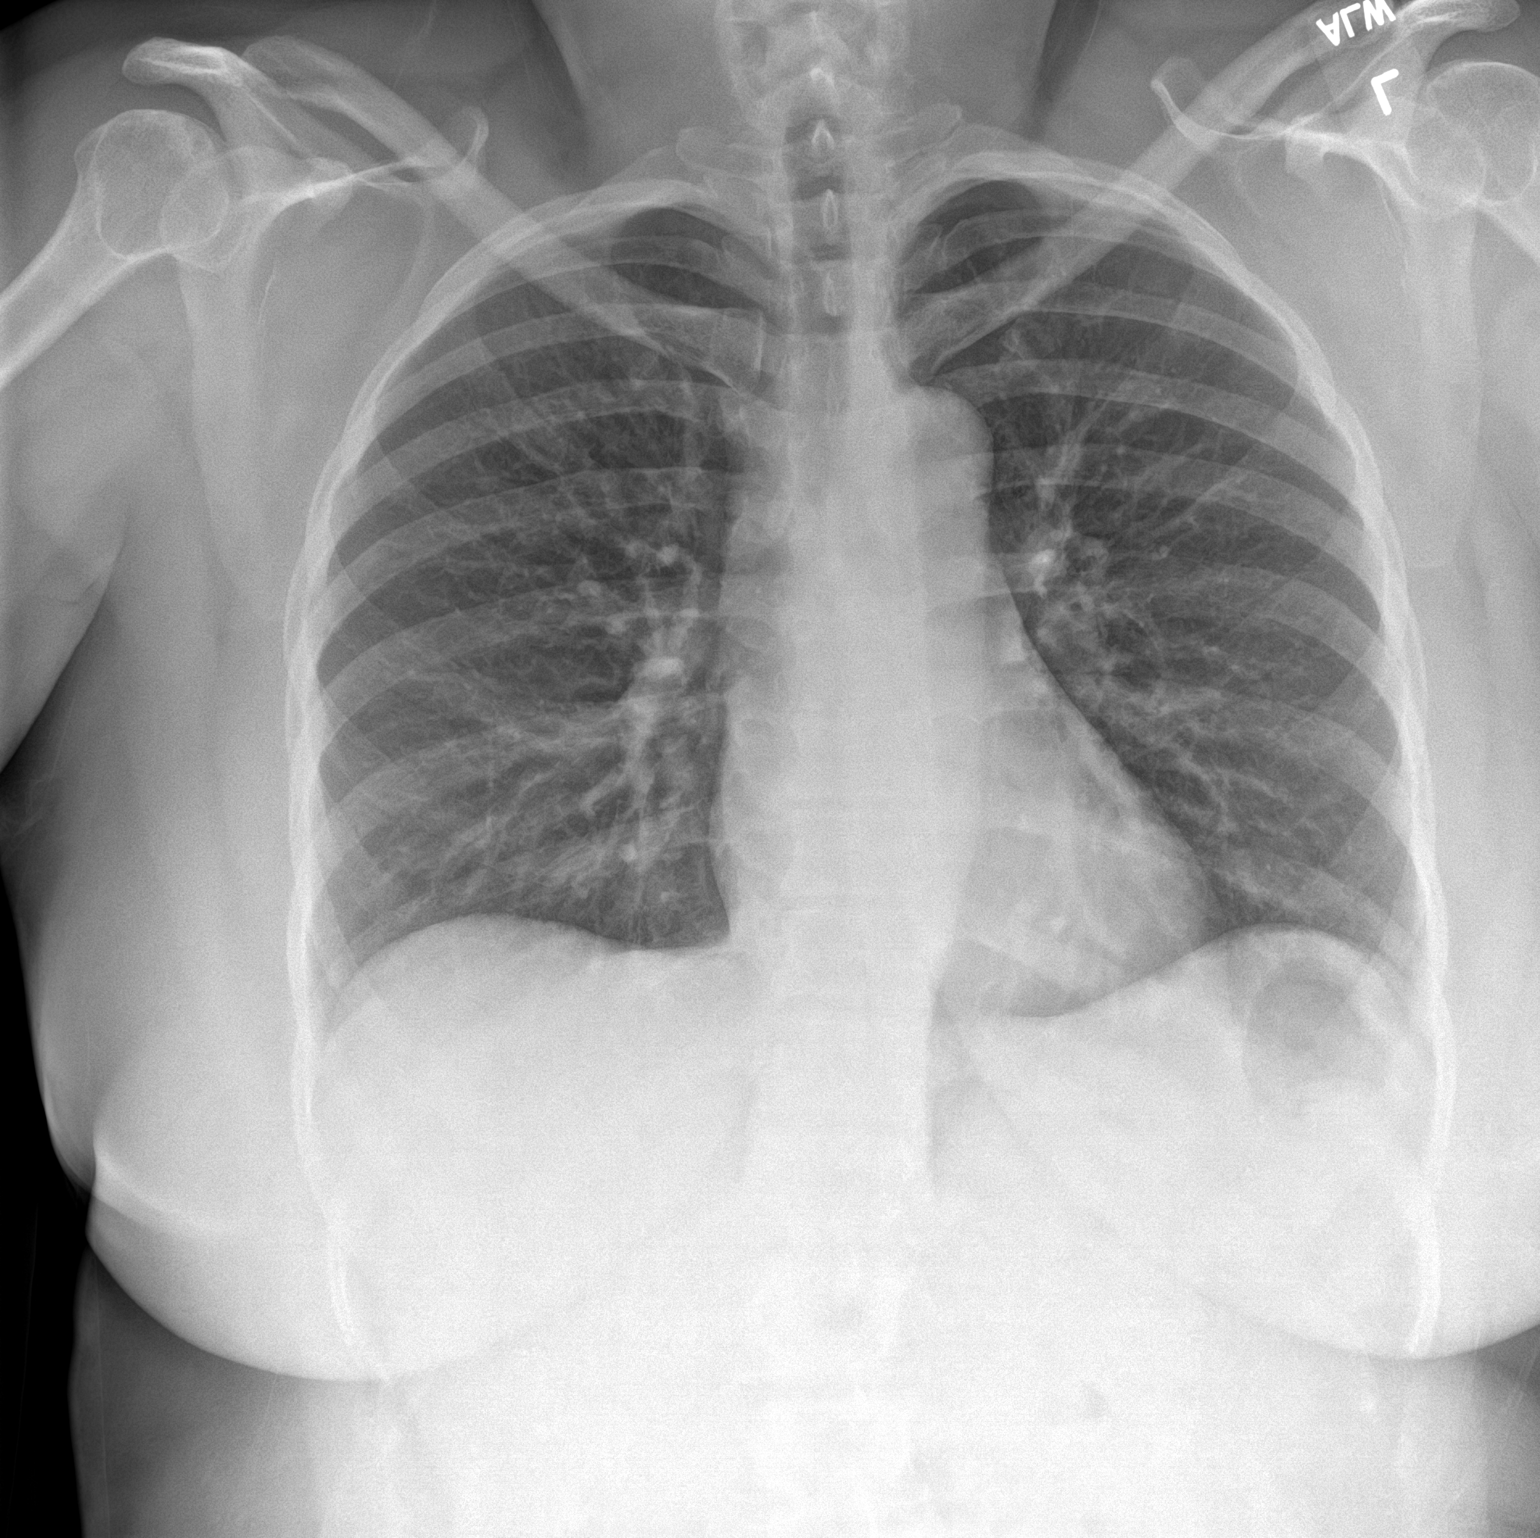

[chest lat]
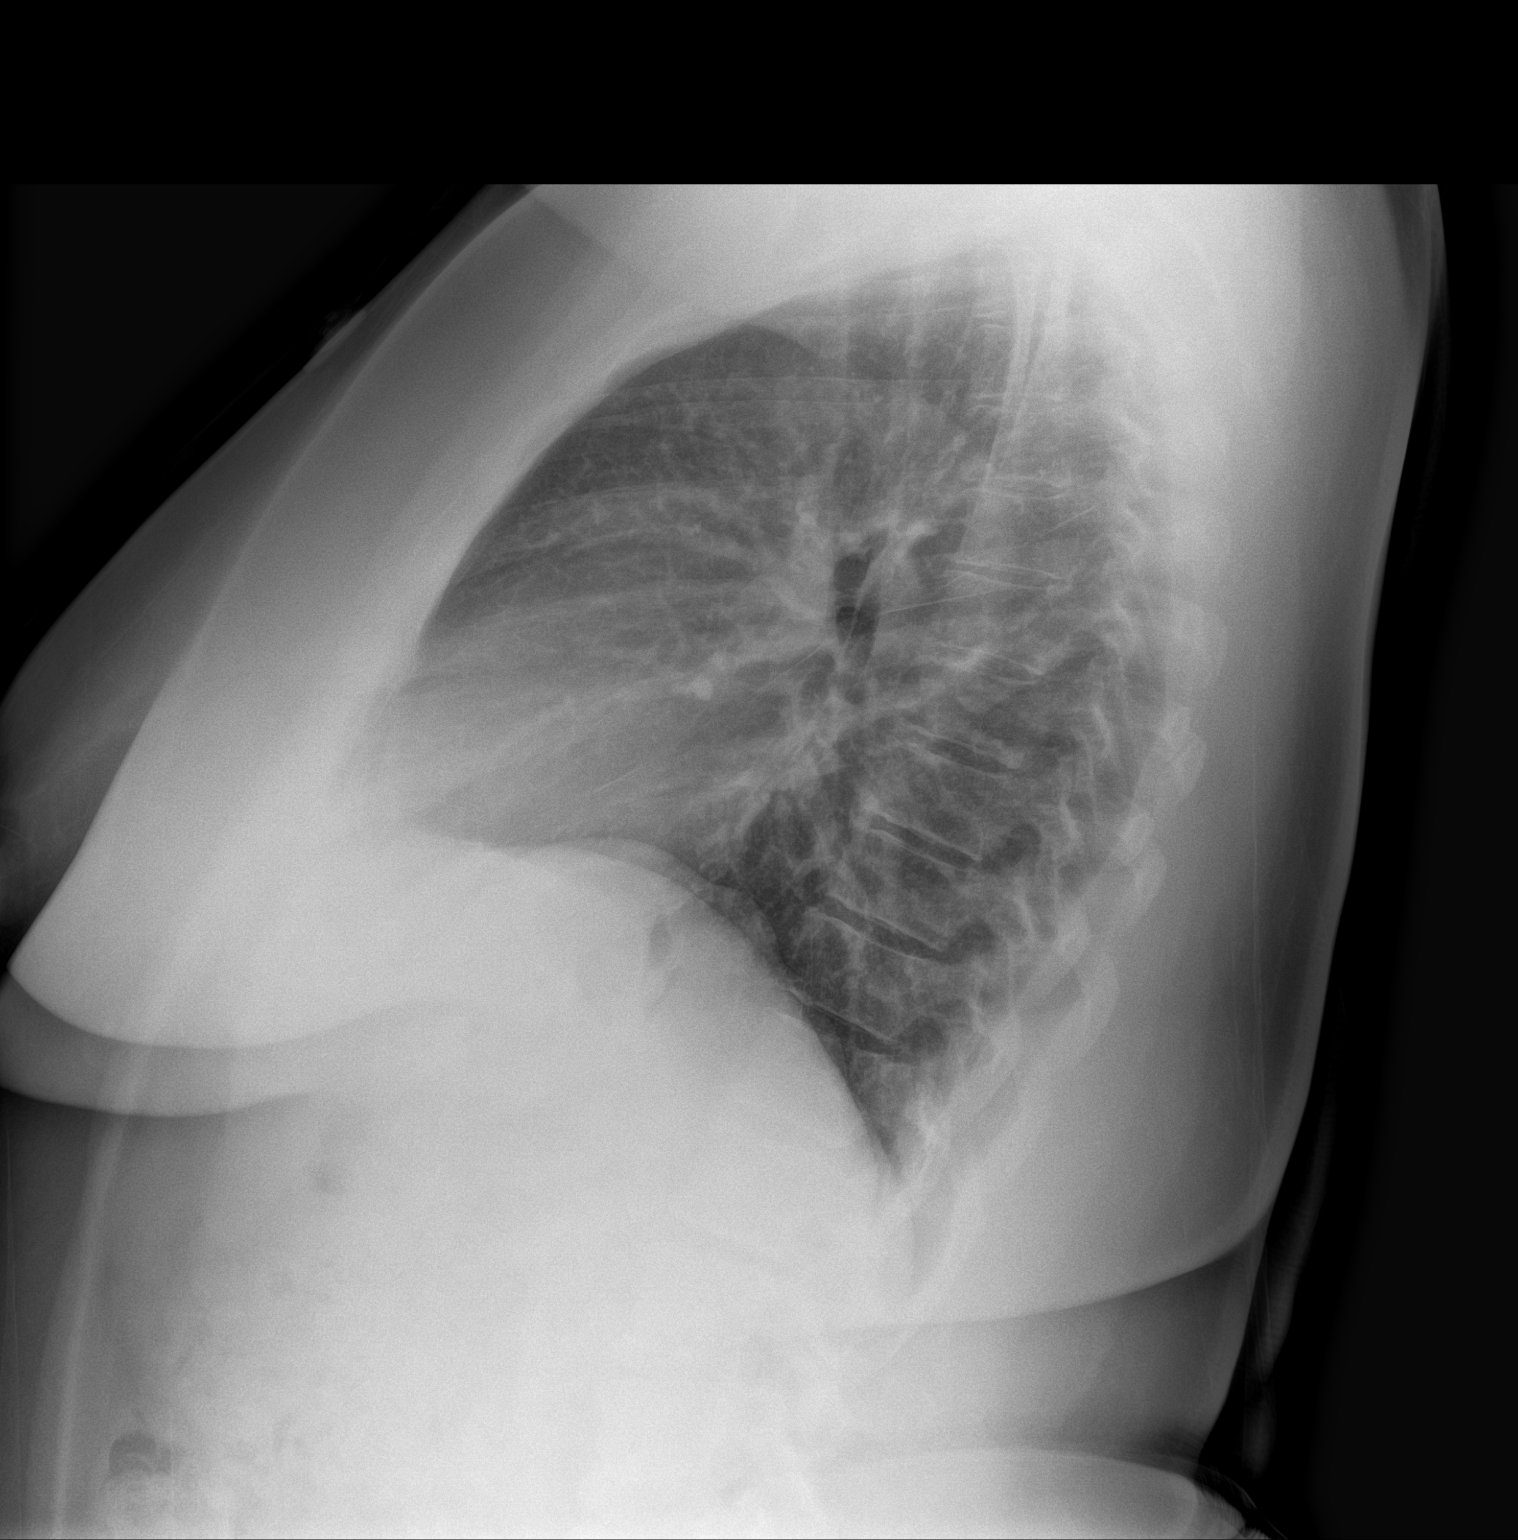

[2 of 2 positions shown; findings below may reference images not displayed]

FINDINGS: No consolidation. No visible pleural effusions or pneumothorax.
Cardiomediastinal silhouette is within normal limits. No evidence of
acute osseous abnormality.
IMPRESSION: No evidence of acute cardiopulmonary disease.

## 2023-06-19 ENCOUNTER — Ambulatory Visit (HOSPITAL_COMMUNITY)
Admission: EM | Admit: 2023-06-19 | Discharge: 2023-06-19 | Disposition: A | Discharge status: Home / Self Care | Payer: 59 | Attending: Family Medicine | Admitting: Family Medicine

## 2023-06-19 ENCOUNTER — Encounter (HOSPITAL_COMMUNITY): Payer: Self-pay

## 2023-06-19 ENCOUNTER — Ambulatory Visit (INDEPENDENT_AMBULATORY_CARE_PROVIDER_SITE_OTHER): Payer: 59

## 2023-06-19 DIAGNOSIS — M25511 Pain in right shoulder: Secondary | ICD-10-CM

## 2023-06-19 DIAGNOSIS — M25561 Pain in right knee: Secondary | ICD-10-CM

## 2023-06-19 DIAGNOSIS — M25552 Pain in left hip: Secondary | ICD-10-CM | POA: Diagnosis not present

## 2023-06-19 MED ORDER — HYDROCODONE-ACETAMINOPHEN 5-325 MG PO TABS
ORAL_TABLET | ORAL | Status: AC
  Filled 2023-06-19: qty 1

## 2023-06-19 MED ORDER — HYDROCODONE-ACETAMINOPHEN 5-325 MG PO TABS: 1.0000 | ORAL_TABLET | Freq: Four times a day (QID) | ORAL | 0 refills | Status: AC | PRN

## 2023-06-19 MED ORDER — HYDROCODONE-ACETAMINOPHEN 5-325 MG PO TABS
1.0000 | ORAL_TABLET | Freq: Once | ORAL | Status: AC
  Administered 2023-06-19: 1 via ORAL

## 2023-06-19 NOTE — Discharge Instructions (Signed)
Be aware, you have been prescribed pain medications that may cause drowsiness. While taking this medication, do not take any other medications containing acetaminophen (Tylenol). Do not combine with alcohol or recreational drugs. Please do not drive, operate heavy machinery, or take part in activities that require making important decisions while on this medication as your judgement may be clouded.  
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
## 2023-07-06 IMAGING — DX DG KNEE COMPLETE 4+V*R*
4 series · 4 of 4 positions shown · non-contrast
Comparison: 12/28/2021

CLINICAL DATA: Knee pain over the last 3 weeks which is worsening.
No known injury.

EXAM:
RIGHT KNEE - COMPLETE 4+ VIEW

[knee ap]
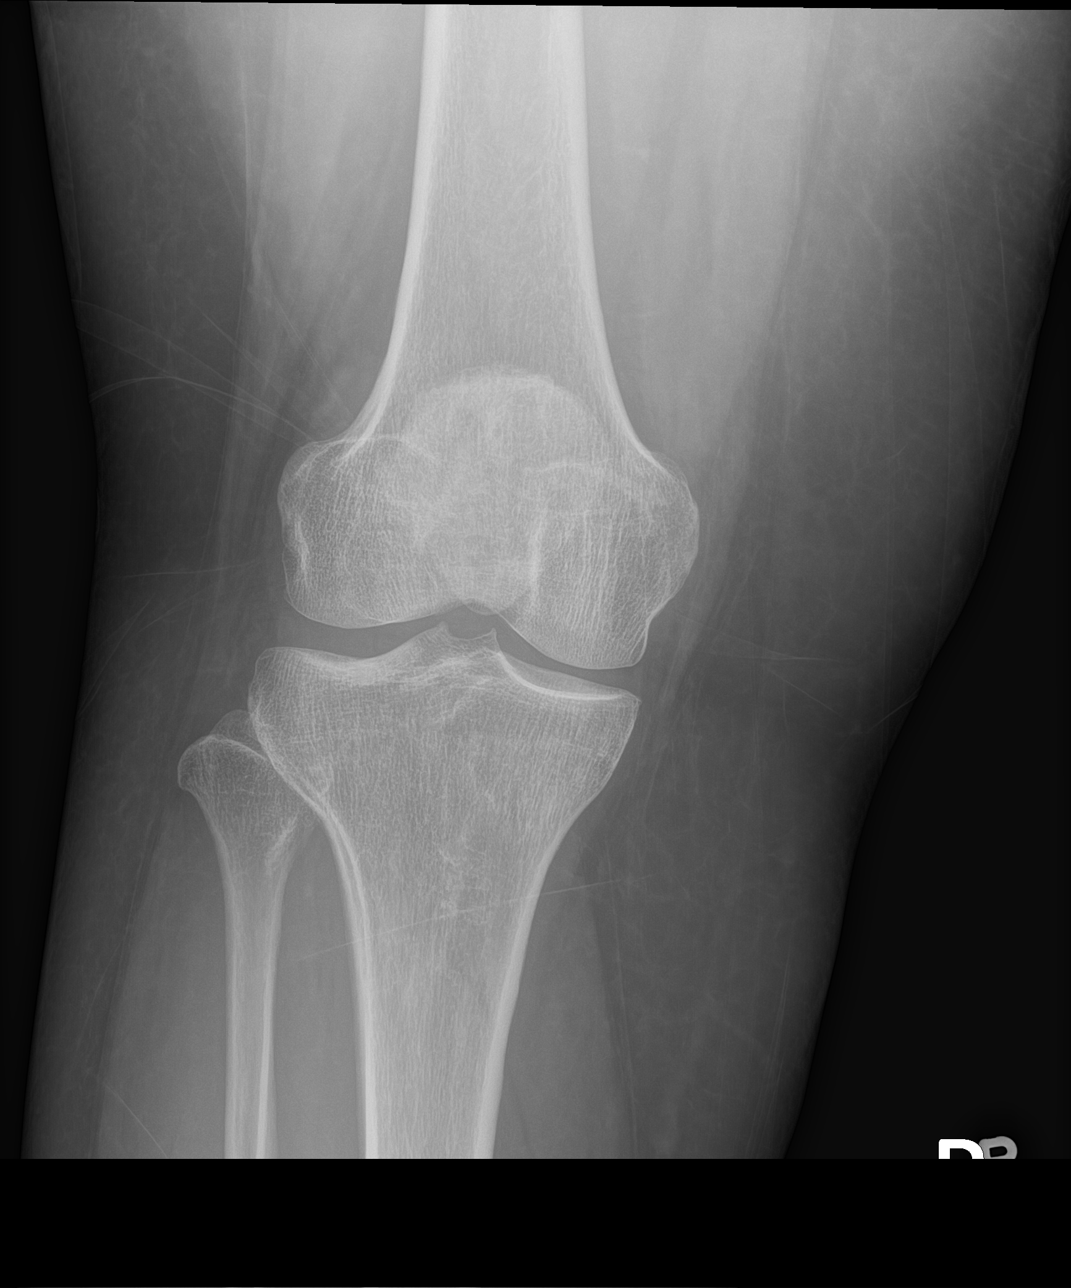

[knee obl (1 of 2)]
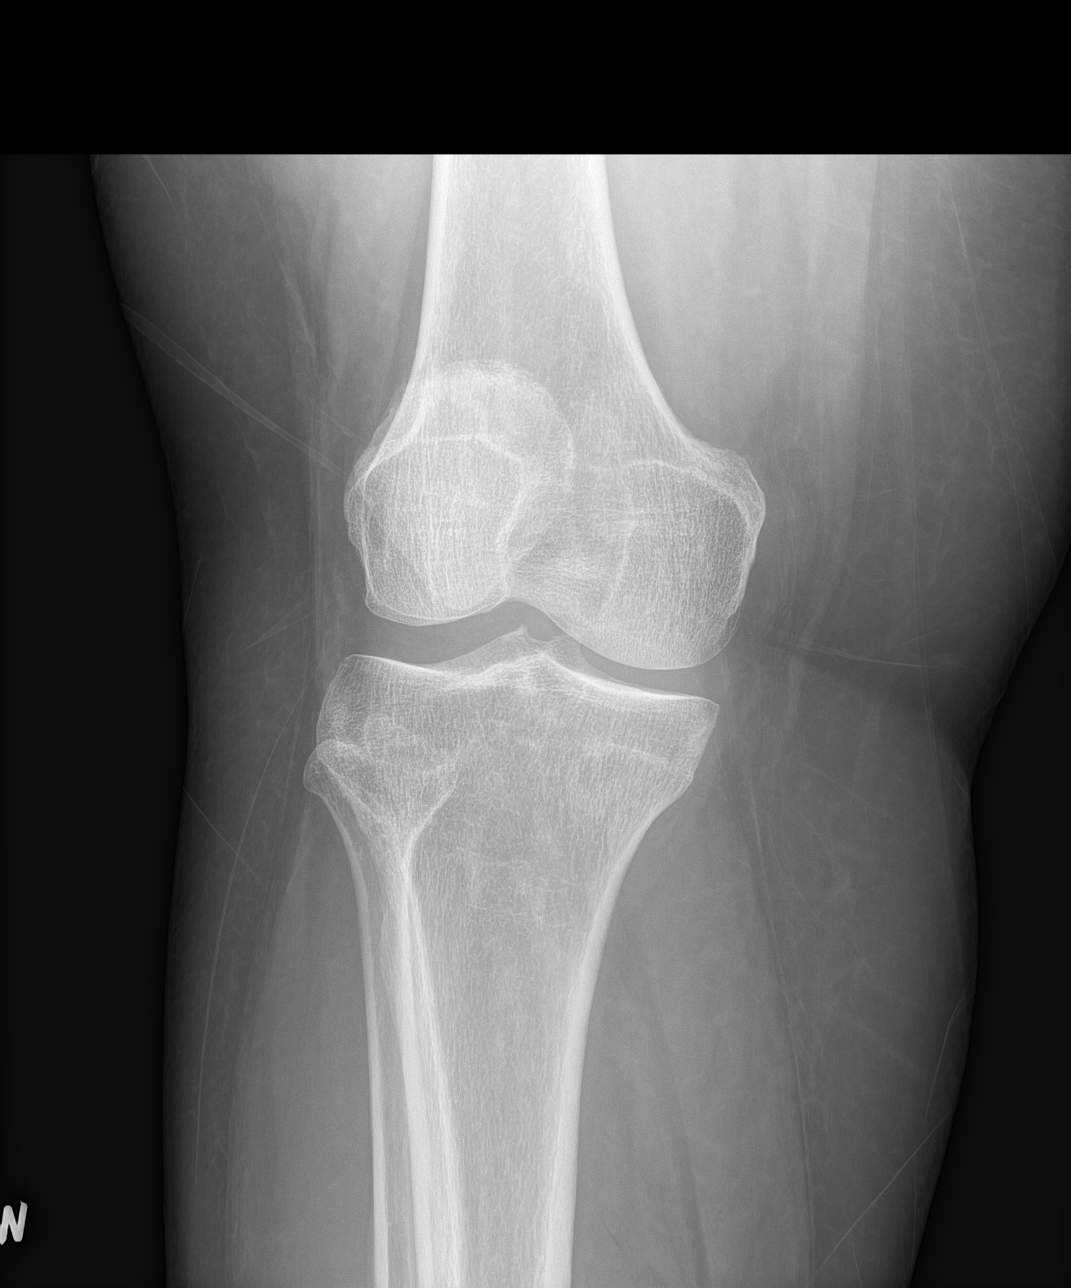

[knee obl (2 of 2)]
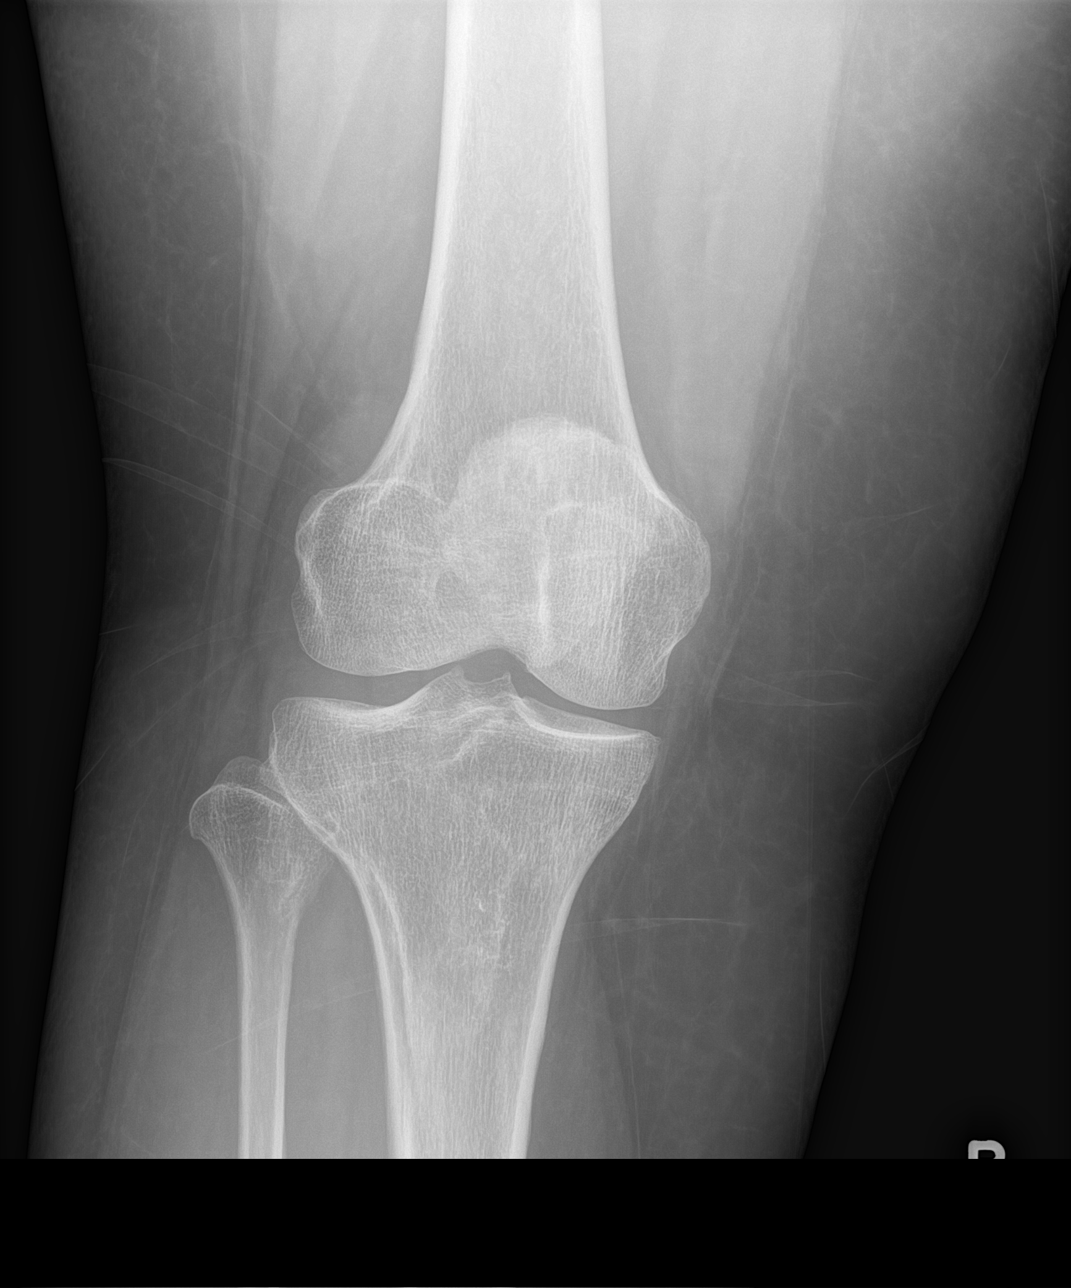

[knee lat]
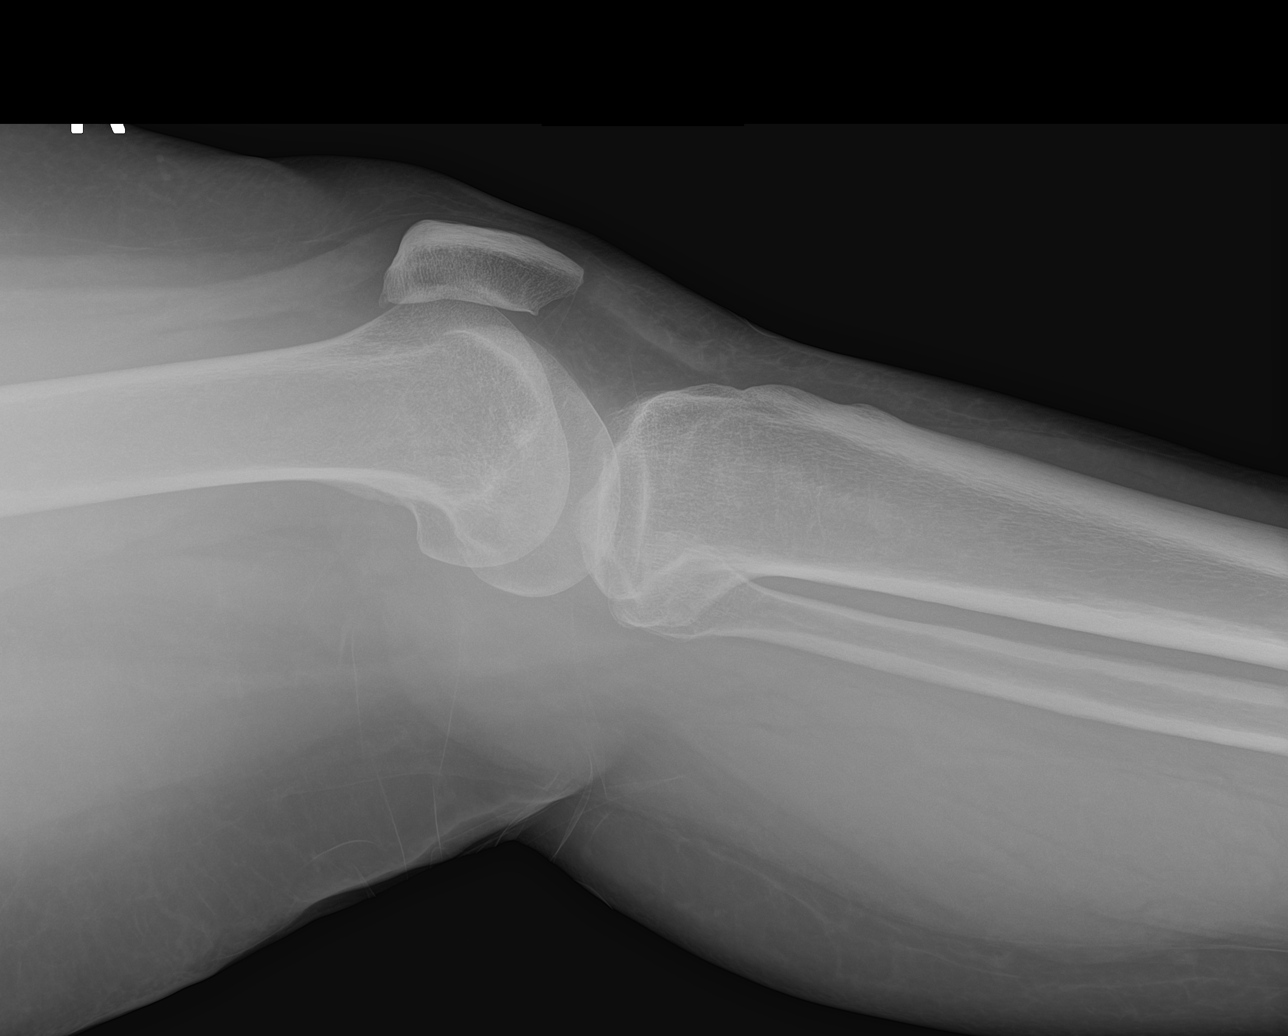

[4 of 4 positions shown; findings below may reference images not displayed]

FINDINGS: No evidence of fracture, dislocation, or joint effusion. No evidence
of arthropathy or other focal bone abnormality. Soft tissues are
unremarkable.
IMPRESSION: Negative radiographs. Lateral view is slightly rotated which makes
detection of the subtle patellofemoral degenerative changes seen
previously difficult to evaluate.

## 2023-07-09 ENCOUNTER — Other Ambulatory Visit (HOSPITAL_BASED_OUTPATIENT_CLINIC_OR_DEPARTMENT_OTHER): Payer: Self-pay

## 2023-07-09 MED ORDER — OXYCODONE-ACETAMINOPHEN 10-325 MG PO TABS
1.0000 | ORAL_TABLET | Freq: Three times a day (TID) | ORAL | 0 refills | Status: DC
  Filled 2023-07-09: qty 21, 7d supply, fill #0

## 2023-07-17 ENCOUNTER — Other Ambulatory Visit (HOSPITAL_BASED_OUTPATIENT_CLINIC_OR_DEPARTMENT_OTHER): Payer: Self-pay

## 2023-07-17 MED ORDER — OXYCODONE-ACETAMINOPHEN 10-325 MG PO TABS
1.0000 | ORAL_TABLET | Freq: Three times a day (TID) | ORAL | 0 refills | Status: DC
Start: 2023-07-17 — End: 2023-08-14
  Filled 2023-07-17: qty 90, 30d supply, fill #0

## 2023-07-17 MED ORDER — NALOXONE HCL 4 MG/0.1ML NA LIQD
NASAL | 0 refills | Status: AC
  Filled 2023-07-17: qty 2, 1d supply, fill #0

## 2023-07-18 ENCOUNTER — Other Ambulatory Visit (HOSPITAL_BASED_OUTPATIENT_CLINIC_OR_DEPARTMENT_OTHER): Payer: Self-pay

## 2023-07-31 ENCOUNTER — Other Ambulatory Visit (HOSPITAL_BASED_OUTPATIENT_CLINIC_OR_DEPARTMENT_OTHER): Payer: Self-pay

## 2023-08-14 ENCOUNTER — Other Ambulatory Visit (HOSPITAL_BASED_OUTPATIENT_CLINIC_OR_DEPARTMENT_OTHER): Payer: Self-pay

## 2023-08-14 MED ORDER — ERGOCALCIFEROL 1.25 MG (50000 UT) PO CAPS
1.0000 | ORAL_CAPSULE | ORAL | 1 refills | Status: AC
  Filled 2023-08-14: qty 12, 84d supply, fill #0
  Filled 2023-10-12 – 2023-11-01 (×2): qty 12, 84d supply, fill #1
  Filled 2024-01-25: qty 12, 84d supply, fill #2
  Filled 2024-06-12: qty 12, 84d supply, fill #3

## 2023-08-14 MED ORDER — OXYCODONE-ACETAMINOPHEN 10-325 MG PO TABS
1.0000 | ORAL_TABLET | Freq: Three times a day (TID) | ORAL | 0 refills | Status: DC
  Filled 2023-08-14: qty 90, 30d supply, fill #0

## 2023-09-13 ENCOUNTER — Other Ambulatory Visit (HOSPITAL_BASED_OUTPATIENT_CLINIC_OR_DEPARTMENT_OTHER): Payer: Self-pay

## 2023-09-13 ENCOUNTER — Other Ambulatory Visit (HOSPITAL_COMMUNITY): Payer: Self-pay

## 2023-09-13 MED ORDER — OXYCODONE-ACETAMINOPHEN 10-325 MG PO TABS
1.0000 | ORAL_TABLET | Freq: Three times a day (TID) | ORAL | 0 refills | Status: AC
  Filled 2023-09-13 (×2): qty 90, 30d supply, fill #0

## 2023-10-11 ENCOUNTER — Other Ambulatory Visit (HOSPITAL_BASED_OUTPATIENT_CLINIC_OR_DEPARTMENT_OTHER): Payer: Self-pay

## 2023-10-11 MED ORDER — OXYCODONE-ACETAMINOPHEN 10-325 MG PO TABS
1.0000 | ORAL_TABLET | Freq: Three times a day (TID) | ORAL | 0 refills | Status: DC
  Filled 2023-10-13: qty 90, 30d supply, fill #0

## 2023-10-12 ENCOUNTER — Other Ambulatory Visit (HOSPITAL_BASED_OUTPATIENT_CLINIC_OR_DEPARTMENT_OTHER): Payer: Self-pay

## 2023-10-13 ENCOUNTER — Other Ambulatory Visit (HOSPITAL_BASED_OUTPATIENT_CLINIC_OR_DEPARTMENT_OTHER): Payer: Self-pay

## 2023-11-01 ENCOUNTER — Other Ambulatory Visit (HOSPITAL_BASED_OUTPATIENT_CLINIC_OR_DEPARTMENT_OTHER): Payer: Self-pay

## 2023-11-13 ENCOUNTER — Other Ambulatory Visit (HOSPITAL_BASED_OUTPATIENT_CLINIC_OR_DEPARTMENT_OTHER): Payer: Self-pay

## 2023-11-13 MED ORDER — NALOXONE HCL 4 MG/0.1ML NA LIQD
NASAL | 0 refills | Status: AC
  Filled 2023-11-13: qty 2, 1d supply, fill #0

## 2023-11-13 MED ORDER — OXYCODONE-ACETAMINOPHEN 10-325 MG PO TABS
1.0000 | ORAL_TABLET | Freq: Three times a day (TID) | ORAL | 0 refills | Status: DC
  Filled 2023-11-13: qty 90, 30d supply, fill #0

## 2023-12-14 ENCOUNTER — Other Ambulatory Visit (HOSPITAL_BASED_OUTPATIENT_CLINIC_OR_DEPARTMENT_OTHER): Payer: Self-pay

## 2023-12-14 MED ORDER — OXYCODONE-ACETAMINOPHEN 10-325 MG PO TABS
1.0000 | ORAL_TABLET | Freq: Three times a day (TID) | ORAL | 0 refills | Status: DC
  Filled 2023-12-14: qty 90, 30d supply, fill #0

## 2024-01-05 ENCOUNTER — Emergency Department (HOSPITAL_BASED_OUTPATIENT_CLINIC_OR_DEPARTMENT_OTHER)
Admission: EM | Admit: 2024-01-05 | Discharge: 2024-01-05 | Disposition: A | Discharge status: Home / Self Care | Payer: Medicaid Other | Attending: Emergency Medicine | Admitting: Emergency Medicine

## 2024-01-05 ENCOUNTER — Emergency Department (HOSPITAL_BASED_OUTPATIENT_CLINIC_OR_DEPARTMENT_OTHER): Payer: Medicaid Other

## 2024-01-05 ENCOUNTER — Encounter (HOSPITAL_BASED_OUTPATIENT_CLINIC_OR_DEPARTMENT_OTHER): Payer: Self-pay

## 2024-01-05 DIAGNOSIS — R519 Headache, unspecified: Secondary | ICD-10-CM | POA: Diagnosis not present

## 2024-01-05 DIAGNOSIS — R55 Syncope and collapse: Secondary | ICD-10-CM | POA: Insufficient documentation

## 2024-01-05 DIAGNOSIS — Z8673 Personal history of transient ischemic attack (TIA), and cerebral infarction without residual deficits: Secondary | ICD-10-CM | POA: Insufficient documentation

## 2024-01-05 DIAGNOSIS — Z20822 Contact with and (suspected) exposure to covid-19: Secondary | ICD-10-CM | POA: Diagnosis not present

## 2024-01-05 LAB — COMPREHENSIVE METABOLIC PANEL
ALT: 8 U/L (ref 0–44)
AST: 11 U/L — ABNORMAL LOW (ref 15–41)
Albumin: 4.1 g/dL (ref 3.5–5.0)
Alkaline Phosphatase: 61 U/L (ref 38–126)
Anion gap: 6 (ref 5–15)
BUN: 7 mg/dL (ref 6–20)
CO2: 28 mmol/L (ref 22–32)
Calcium: 8.7 mg/dL — ABNORMAL LOW (ref 8.9–10.3)
Chloride: 105 mmol/L (ref 98–111)
Creatinine, Ser: 0.88 mg/dL (ref 0.44–1.00)
GFR, Estimated: >60 mL/min (ref >60)
Glucose, Bld: 84 mg/dL (ref 70–99)
Potassium: 3.5 mmol/L (ref 3.5–5.1)
Sodium: 139 mmol/L (ref 135–145)
Total Bilirubin: 0.4 mg/dL (ref 0.0–1.2)
Total Protein: 6.3 g/dL — ABNORMAL LOW (ref 6.5–8.1)

## 2024-01-05 LAB — TROPONIN I (HIGH SENSITIVITY): Troponin I (High Sensitivity): <2 ng/L

## 2024-01-05 LAB — CBC WITH DIFFERENTIAL/PLATELET
Abs Immature Granulocytes: 0.01 10*3/uL (ref 0.00–0.07)
Basophils Absolute: 0.1 10*3/uL (ref 0.0–0.1)
Basophils Relative: 2 %
Eosinophils Absolute: 0.4 10*3/uL (ref 0.0–0.5)
Eosinophils Relative: 8 %
HCT: 32.1 % — ABNORMAL LOW (ref 36.0–46.0)
Hemoglobin: 10.5 g/dL — ABNORMAL LOW (ref 12.0–15.0)
Immature Granulocytes: 0 %
Lymphocytes Relative: 51 %
Lymphs Abs: 2.6 10*3/uL (ref 0.7–4.0)
MCH: 27.1 pg (ref 26.0–34.0)
MCHC: 32.7 g/dL (ref 30.0–36.0)
MCV: 82.7 fL (ref 80.0–100.0)
Monocytes Absolute: 0.4 10*3/uL (ref 0.1–1.0)
Monocytes Relative: 7 %
Neutro Abs: 1.6 10*3/uL — ABNORMAL LOW (ref 1.7–7.7)
Neutrophils Relative %: 32 %
Platelets: 262 10*3/uL (ref 150–400)
RBC: 3.88 MIL/uL (ref 3.87–5.11)
RDW: 14.6 % (ref 11.5–15.5)
WBC: 5.1 10*3/uL (ref 4.0–10.5)
nRBC: 0 % (ref 0.0–0.2)

## 2024-01-05 LAB — RESP PANEL BY RT-PCR (RSV, FLU A&B, COVID)  RVPGX2
Influenza A by PCR: NEGATIVE
Influenza B by PCR: NEGATIVE
Resp Syncytial Virus by PCR: NEGATIVE
SARS Coronavirus 2 by RT PCR: NEGATIVE

## 2024-01-05 LAB — BRAIN NATRIURETIC PEPTIDE: B Natriuretic Peptide: 62 pg/mL (ref 0.0–100.0)

## 2024-01-05 LAB — LIPASE, BLOOD: Lipase: 34 U/L (ref 11–51)

## 2024-01-05 MED ORDER — ACETAMINOPHEN 500 MG PO TABS
1000.0000 mg | ORAL_TABLET | Freq: Once | ORAL | Status: AC
  Administered 2024-01-05: 1000 mg via ORAL
  Filled 2024-01-05: qty 2

## 2024-01-05 MED ORDER — LIDOCAINE VISCOUS HCL 2 % MT SOLN
15.0000 mL | Freq: Once | OROMUCOSAL | Status: AC
Start: 2024-01-05 — End: 2024-01-05
  Administered 2024-01-05: 15 mL via ORAL
  Filled 2024-01-05: qty 15

## 2024-01-05 MED ORDER — METOCLOPRAMIDE HCL 5 MG/ML IJ SOLN
10.0000 mg | Freq: Once | INTRAMUSCULAR | Status: AC
  Administered 2024-01-05: 10 mg via INTRAVENOUS
  Filled 2024-01-05: qty 2

## 2024-01-05 MED ORDER — ALUM & MAG HYDROXIDE-SIMETH 200-200-20 MG/5ML PO SUSP
30.0000 mL | Freq: Once | ORAL | Status: AC
  Administered 2024-01-05: 30 mL via ORAL
  Filled 2024-01-05: qty 30

## 2024-01-05 MED ORDER — DIPHENHYDRAMINE HCL 50 MG/ML IJ SOLN
12.5000 mg | Freq: Once | INTRAMUSCULAR | Status: AC
  Administered 2024-01-05: 12.5 mg via INTRAVENOUS
  Filled 2024-01-05: qty 1

## 2024-01-05 MED ORDER — ONDANSETRON HCL 4 MG/2ML IJ SOLN
4.0000 mg | Freq: Once | INTRAMUSCULAR | Status: AC
Start: 2024-01-05 — End: 2024-01-05
  Administered 2024-01-05: 4 mg via INTRAVENOUS
  Filled 2024-01-05: qty 2

## 2024-01-05 MED ORDER — LACTATED RINGERS IV BOLUS
1000.0000 mL | Freq: Once | INTRAVENOUS | Status: AC
  Administered 2024-01-05: 1000 mL via INTRAVENOUS

## 2024-01-05 MED ORDER — FAMOTIDINE IN NACL 20-0.9 MG/50ML-% IV SOLN
20.0000 mg | Freq: Once | INTRAVENOUS | Status: AC
  Administered 2024-01-05: 20 mg via INTRAVENOUS
  Filled 2024-01-05: qty 50

## 2024-01-05 NOTE — ED Notes (Signed)
During Orthostatic Vitals Pt was able to sit up and stand but Pt appeared very drowsy. While standing Pt stated her legs felt very weak. Pt was about to stand to finish out her Orthostatic Vitals with assistance from her daughter and I on both sides of her. Pt helped back in bed and blankets were given. Pt stated she just feels so weak and tired.

## 2024-01-05 NOTE — ED Provider Notes (Signed)
Care of patient received from prior provider at 3:24 PM, please see their note for complete H/P and care plan.  Received handoff per ED course.  Clinical Course as of 01/05/24 1524  Sat Jan 05, 2024  1523 Stable 47 YOF with syncope Hit head and has a headache getting CTH cleared for OP from a syncope perspective.   [CC]    Clinical Course User Index [CC] Glyn Ade, MD    Reassessment: On reassessment, patient is in no acute distress.  States her headache is improved.  Lab work reviewed and grossly negative, CT scan without any focal pathology.  Patient is stable for outpatient care management follow-up with primary care provider.     Glyn Ade, MD 01/05/24 [phone]

## 2024-01-05 NOTE — ED Provider Notes (Signed)
[place] EMERGENCY DEPARTMENT AT [facility] Provider Note   CSN: [id number] Arrival date & time: 01/05/24  1112     History  Chief Complaint  Patient presents with   Loss of Consciousness    Nicole [name] is a 48 y.o. female.   Loss of Consciousness    48 year old female with medical history significant for CVA, seizure disorder, GERD, obesity presenting to the emergency department after a syncopal episode.  The patient states that she was at work earlier today sitting when she had sudden onset loss of consciousness.  This loss of consciousness was witnessed by a Animator.  There was no reported seizure like activity.  The patient denies any tongue biting or urinary incontinence.  She denies any known prodrome, does not have any chest pain or shortness of breath.  She states that she thinks she hit her head during the syncopal episode and has been having a headache ever since.  She denies any other injuries or complaints.  She is currently asymptomatic other than her current headache.  Home Medications Prior to Admission medications   Medication Sig Start Date End Date Taking? Authorizing Provider  AIMOVIG 140 MG/ML SOAJ Inject 140 mg into the skin every 30 (thirty) days. 05/25/21   [provider]  cetirizine (ZYRTEC) 10 MG tablet Take 10 mg by mouth daily.    [provider]  cyclobenzaprine (FLEXERIL) 10 MG tablet Take 1 tablet (10 mg total) by mouth 2 (two) times daily as needed for muscle spasms. 03/05/23   Curatolo, Adam, DO  EPINEPHrine 0.3 mg/0.3 mL IJ SOAJ injection Inject 0.3 mg into the muscle as needed for anaphylaxis.    [provider]  ergocalciferol (VITAMIN D2) 1.25 MG (50000 UT) capsule Take 1 capsule (50,000 Units total) by mouth once a week. 08/14/23     fluticasone (FLONASE) 50 MCG/ACT nasal spray Place 2 sprays into both nostrils daily. 12/21/20   [provider]  HYDROcodone-acetaminophen (NORCO/VICODIN) 5-325 MG  tablet Take 1 tablet by mouth every 6 (six) hours as needed for severe pain. 06/19/23   Mardella Layman, MD  ibuprofen (ADVIL) 800 MG tablet Take 1 tablet (800 mg total) by mouth 3 (three) times daily. 01/23/23   Hermina Staggers, MD  levETIRAcetam (KEPPRA) 1000 MG tablet Take 1,000-2,000 mg by mouth in the morning, at noon, and at bedtime. 04/08/21   [provider]  methocarbamol (ROBAXIN) 500 MG tablet Take 1 tablet (500 mg total) by mouth every 8 (eight) hours as needed for muscle spasms. 02/03/23   Benjiman Core, MD  montelukast (SINGULAIR) 10 MG tablet Take 1 tablet (10 mg total) by mouth at bedtime. 07/03/19   Cirigliano, Jearld Lesch, DO  naloxone (NARCAN) nasal spray 4 mg/0.1 mL Use 1 spray every 3 minutes; spray 1 dose into ONE nostril; alternate nostrils with each dose until help arrives. 07/17/23     naloxone (NARCAN) nasal spray 4 mg/0.1 mL Spray 1 dose into ONE nostril every 3 minutes until help arrives. Alternate nostrils with each dose. 11/13/23     oxyCODONE-acetaminophen (PERCOCET) 10-325 MG tablet Take 1 tablet by mouth 3 (three) times daily. 09/13/23     oxyCODONE-acetaminophen (PERCOCET) 10-325 MG tablet Take 1 tablet by mouth 3 (three) times daily. 12/14/23     SUMAtriptan (IMITREX) 100 MG tablet Take 100 mg by mouth every 2 (two) hours as needed for migraine. 04/08/21   [provider]  Dimas Millin 1/35 tablet TAKE 1 TABLET BY MOUTH EVERY DAY Patient  not taking: No sig reported 08/07/19 06/23/21  Brock Bad, MD  famotidine (PEPCID) 20 MG tablet Take 1 tablet (20 mg total) by mouth daily. Patient not taking: No sig reported 11/13/19 06/23/21  Belinda Fisher, PA-C  ferrous sulfate 325 (65 FE) MG tablet Take 1 tablet (325 mg total) by mouth daily. Patient not taking: No sig reported 12/29/18 06/23/21  Petrucelli, Samantha R, PA-C  ipratropium (ATROVENT) 0.06 % nasal spray Place 2 sprays into both nostrils 4 (four) times daily. Patient not taking: No sig reported 11/13/19 06/23/21  Belinda Fisher, PA-C  medroxyPROGESTERone (DEPO-PROVERA) 150 MG/ML injection INJECT 1 ML (150 MG TOTAL) INTO THE MUSCLE EVERY 3 (THREE) MONTHS. Patient not taking: No sig reported 03/03/20 06/23/21  Brock Bad, MD  topiramate (TOPAMAX) 25 MG tablet Take 3 tablets (75 mg total) by mouth 2 (two) times daily. Patient not taking: No sig reported 07/07/18 06/23/21  Molpus, Jonny Ruiz, MD      Allergies    Green tea leaf ext, Penicillins, Shellfish allergy, Shrimp extract, Sulfa antibiotics, Tea, and Sulfamethoxazole    Review of Systems   Review of Systems  Cardiovascular:  Positive for syncope.  Neurological:  Positive for syncope.  All other systems reviewed and are negative.   Physical Exam Updated Vital Signs BP (!) 156/91   Pulse (!) 59   Temp 98.3 F (36.8 C) (Oral)   Resp 16   Ht 5\' 5"  (1.651 m)   Wt 123.8 kg   LMP 11/07/2022 (Exact Date)   SpO2 100%   BMI 45.43 kg/m  Physical Exam Vitals and nursing note reviewed.  Constitutional:      General: She is not in acute distress.    Appearance: She is well-developed.  HENT:     Head: Normocephalic and atraumatic.  Eyes:     Conjunctiva/sclera: Conjunctivae normal.  Cardiovascular:     Rate and Rhythm: Normal rate and regular rhythm.     Heart sounds: No murmur heard. Pulmonary:     Effort: Pulmonary effort is normal. No respiratory distress.     Breath sounds: Normal breath sounds.  Abdominal:     Palpations: Abdomen is soft.     Tenderness: There is no abdominal tenderness.  Musculoskeletal:        General: No swelling.     Cervical back: Neck supple.  Skin:    General: Skin is warm and dry.     Capillary Refill: Capillary refill takes less than 2 seconds.  Neurological:     Mental Status: She is alert.     Comments: MENTAL STATUS EXAM:    Orientation: Alert and oriented to person, place and time.  Memory: Cooperative, follows commands well.  Language: Speech is clear and language is normal.   CRANIAL NERVES:    CN 2  (Optic): Visual fields intact to confrontation.  CN 3,4,6 (EOM): Pupils equal and reactive to light. Full extraocular eye movement without nystagmus.  CN 5 (Trigeminal): Facial sensation is normal, no weakness of masticatory muscles.  CN 7 (Facial): No facial weakness or asymmetry.  CN 8 (Auditory): Auditory acuity grossly normal.  CN 9,10 (Glossophar): The uvula is midline, the palate elevates symmetrically.  CN 11 (spinal access): Normal sternocleidomastoid and trapezius strength.  CN 12 (Hypoglossal): The tongue is midline. No atrophy or fasciculations.Marland Kitchen   MOTOR:  Muscle Strength: 5/5RUE, 5/5LUE, 5/5RLE, 5/5LLE.   COORDINATION:   Intact finger-to-nose, no tremor.   SENSATION:   Intact to light touch all four  extremities.    Psychiatric:        Mood and Affect: Mood normal.     ED Results / Procedures / Treatments   Labs (all labs ordered are listed, but only abnormal results are displayed) Labs Reviewed  CBC WITH DIFFERENTIAL/PLATELET - Abnormal; Notable for the following components:      Result Value   Hemoglobin 10.5 (*)    HCT 32.1 (*)    Neutro Abs 1.6 (*)    All other components within normal limits  COMPREHENSIVE METABOLIC PANEL - Abnormal; Notable for the following components:   Calcium 8.7 (*)    Total Protein 6.3 (*)    AST 11 (*)    All other components within normal limits  RESP PANEL BY RT-PCR (RSV, FLU A&B, COVID)  RVPGX2  LIPASE, BLOOD  BRAIN NATRIURETIC PEPTIDE  TROPONIN I (HIGH SENSITIVITY)    EKG EKG Interpretation Date/Time:  Saturday January 05 2024 11:23:15 EST Ventricular Rate:  57 PR Interval:  175 QRS Duration:  91 QT Interval:  422 QTC Calculation: 411 R Axis:   39  Text Interpretation: Sinus bradycardia Confirmed by Ernie Avena ([id number]) on 01/05/2024 1:25:39 PM  Radiology DG Chest Portable 1 View Result Date: 01/05/2024 CLINICAL DATA:  Syncopal episode. EXAM: PORTABLE CHEST 1 VIEW COMPARISON:  02/03/2023 FINDINGS: The heart size and  mediastinal contours are within normal limits. Both lungs are clear. The visualized skeletal structures are unremarkable. IMPRESSION: No active disease. Electronically Signed   By: Danae Orleans M.D.   On: 01/05/2024 12:47    Procedures Procedures    Medications Ordered in ED Medications  famotidine (PEPCID) IVPB 20 mg premix (0 mg Intravenous Stopped 01/05/24 1237)  lactated ringers bolus 1,000 mL (0 mLs Intravenous Stopped 01/05/24 1300)  ondansetron (ZOFRAN) injection 4 mg (4 mg Intravenous Given 01/05/24 1145)  alum & mag hydroxide-simeth (MAALOX/MYLANTA) 200-200-20 MG/5ML suspension 30 mL (30 mLs Oral Given 01/05/24 1434)    And  lidocaine (XYLOCAINE) 2 % viscous mouth solution 15 mL (15 mLs Oral Given 01/05/24 1434)  metoCLOPramide (REGLAN) injection 10 mg (10 mg Intravenous Given 01/05/24 1436)  diphenhydrAMINE (BENADRYL) injection 12.5 mg (12.5 mg Intravenous Given 01/05/24 1436)  acetaminophen (TYLENOL) tablet 1,000 mg (1,000 mg Oral Given 01/05/24 1434)    ED Course/ Medical Decision Making/ A&P Clinical Course as of 01/05/24 1542  Sat Jan 05, 2024  1523 Stable 47 YOF with syncope Hit head and has a headache getting CTH cleared for OP from a syncope perspective.   [CC]    Clinical Course User Index [CC] Glyn Ade, MD                                 Medical Decision Making Amount and/or Complexity of Data Reviewed Labs: ordered. Radiology: ordered.  Risk OTC drugs. Prescription drug management.    48 year old female with medical history significant for CVA, seizure disorder, GERD, obesity presenting to the emergency department after a syncopal episode.  The patient states that she was at work earlier today sitting when she had sudden onset loss of consciousness.  This loss of consciousness was witnessed by a Animator.  There was no reported seizure like activity.  The patient denies any tongue biting or urinary incontinence.  She denies any known prodrome, does not  have any chest pain or shortness of breath.  She states that she thinks she hit her head during the syncopal episode and has  been having a headache ever since.  She denies any other injuries or complaints.  She is currently asymptomatic other than her current headache.  Medical Decision Making:   Nicole [name] is a 48 y.o. female who presented to the ED today with a syncopal episode detailed above.    Patient placed on continuous vitals and telemetry monitoring while in ED which was reviewed periodically.  Complete initial physical exam performed, notably the patient  was neuro intact, no sign of trauma, lungs CTAB.    Reviewed and confirmed nursing documentation for past medical history, family history, social history.    Initial Assessment:   With the patient's presentation of syncope, most likely diagnosis is orthostatic hypotension vs vasovagal episode. Other diagnoses were considered including (but not limited to) arrythmogenic syncope, valvular abnormality, PE, aortic dissection. These are considered less likely due to history of present illness and physical exam findings.   This is most consistent with an acute life/limb threatening illness complicated by underlying chronic conditions. In particular, concerning cardiac etiology, this is less likely to be the etiology given the lack of chest pain, lack of serious comorbidities including heart failure or CAD.    FAINT score can be utilized in syncope to predict cardiac event in the appropriate patient. Utilization of BNP/Troponin testing can predict cardiac events within 30 days.   This patient is a candidate for a faint score as she has a baseline low pretest probability for cardiac etiology. Will proceed with BNP/Troponin testing as below. If negative, then patient without risk factor for acute cardiac event causing her syncope.    Initial Plan:   Screening labs including CBC and Metabolic panel to evaluate for infectious or metabolic  etiology of disease.  CXR to evaluate for structural/infectious intrathoracic pathology.  CT Head given likely head trauma and headache EKG to evaluate for cardiac pathology. Utilization of FAINT scoring detailed above.  Objective evaluation as below reviewed after administration of IVF/Telemetry monitoring  Initial Study Results:   Laboratory  All laboratory results reviewed without evidence of clinically relevant pathology.   Exceptions include: none, Hgb improved from baseline at 10.5  EKG EKG was reviewed independently. Rate, rhythm, axis, intervals all examined and without medically relevant abnormality. ST segments without concerns for elevations.  Sinus bradycardia rate 57 noted  Radiology:  All images reviewed independently. Agree with radiology report at this time.   DG Chest Portable 1 View Result Date: 01/05/2024 CLINICAL DATA:  Syncopal episode. EXAM: PORTABLE CHEST 1 VIEW COMPARISON:  02/03/2023 FINDINGS: The heart size and mediastinal contours are within normal limits. Both lungs are clear. The visualized skeletal structures are unremarkable. IMPRESSION: No active disease. Electronically Signed   By: Danae Orleans M.D.   On: 01/05/2024 12:47         Final Assessment and Plan:   Pt pending CT imaging. Neuro intact. Does state that she had nausea and vomiting for the past 3 days. Suspect mild hypovolemia resulting in syncopal episode. Endorsing headache. No reported seizure activity. Pt rehydrated with an IVF bolus. Orthostatics negative. Pt well appearing and neuro intact. CT Head obtained, results pending at signout. Administered headache cocktail as well. Signout given to Dr. Doran Durand pending results of CT and reassessment with likely disposition plan home pending results of testing and reassessment.    Clinical Impression:  1. Syncope, unspecified syncope type      Data Unavailable    Final Clinical Impression(s) / ED Diagnoses Final diagnoses:  Syncope,  unspecified syncope type  Rx / DC Orders ED Discharge Orders     None         Ernie Avena, MD 01/05/24 1542

## 2024-01-05 NOTE — ED Triage Notes (Signed)
Been having Nausea and vomiting for three days.  Went to work today and "passed out" when standing.  States has pain to head and burning to stomach

## 2024-01-11 ENCOUNTER — Telehealth: Payer: Self-pay

## 2024-01-11 NOTE — Progress Notes (Signed)
Transition Care Management Follow-up Telephone Call Date of discharge and from where: 01/05/2024 [facility] How have you been since you were released from the hospital? Patient stated her headache has not improved. She has contacted her physician and is awaiting a return call today. Any questions or concerns? No  Items Reviewed: Did the pt receive and understand the discharge instructions provided? Yes  Medications obtained and verified? Yes  Other? No  Any new allergies since your discharge? No  Dietary orders reviewed? Yes Do you have support at home? Yes   Follow up appointments reviewed:  PCP Hospital f/u appt confirmed? No  Scheduled to see  on  @ . Specialist Hospital f/u appt confirmed? Yes  Scheduled to see Sanda Klein, MD on 04/14/2024 @ [facility]. Are transportation arrangements needed? No  If their condition worsens, is the pt aware to call PCP or go to the Emergency Dept.? Yes Was the patient provided with contact information for the PCP's office or ED? Yes Was to pt encouraged to call back with questions or concerns? Yes   Demitri Kucinski [facility]  [facility] Guide Direct Dial: [phone]  Fax: [phone] Website: [email or web address]

## 2024-01-14 ENCOUNTER — Other Ambulatory Visit (HOSPITAL_BASED_OUTPATIENT_CLINIC_OR_DEPARTMENT_OTHER): Payer: Self-pay

## 2024-01-14 ENCOUNTER — Other Ambulatory Visit: Payer: Self-pay

## 2024-01-14 MED ORDER — OXYCODONE-ACETAMINOPHEN 10-325 MG PO TABS
1.0000 | ORAL_TABLET | Freq: Three times a day (TID) | ORAL | 0 refills | Status: DC
  Filled 2024-01-14: qty 90, 30d supply, fill #0

## 2024-01-15 ENCOUNTER — Other Ambulatory Visit (HOSPITAL_BASED_OUTPATIENT_CLINIC_OR_DEPARTMENT_OTHER): Payer: Self-pay

## 2024-01-25 ENCOUNTER — Other Ambulatory Visit (HOSPITAL_BASED_OUTPATIENT_CLINIC_OR_DEPARTMENT_OTHER): Payer: Self-pay

## 2024-02-11 ENCOUNTER — Other Ambulatory Visit (HOSPITAL_BASED_OUTPATIENT_CLINIC_OR_DEPARTMENT_OTHER): Payer: Self-pay

## 2024-02-11 MED ORDER — OXYCODONE-ACETAMINOPHEN 10-325 MG PO TABS
1.0000 | ORAL_TABLET | Freq: Three times a day (TID) | ORAL | 0 refills | Status: DC
  Filled 2024-02-14: qty 90, 30d supply, fill #0

## 2024-02-14 ENCOUNTER — Other Ambulatory Visit (HOSPITAL_BASED_OUTPATIENT_CLINIC_OR_DEPARTMENT_OTHER): Payer: Self-pay

## 2024-02-16 ENCOUNTER — Other Ambulatory Visit (HOSPITAL_BASED_OUTPATIENT_CLINIC_OR_DEPARTMENT_OTHER): Payer: Self-pay

## 2024-03-02 ENCOUNTER — Other Ambulatory Visit: Payer: Self-pay

## 2024-03-02 ENCOUNTER — Encounter (HOSPITAL_BASED_OUTPATIENT_CLINIC_OR_DEPARTMENT_OTHER): Payer: Self-pay

## 2024-03-02 ENCOUNTER — Emergency Department (HOSPITAL_BASED_OUTPATIENT_CLINIC_OR_DEPARTMENT_OTHER)

## 2024-03-02 ENCOUNTER — Emergency Department (HOSPITAL_BASED_OUTPATIENT_CLINIC_OR_DEPARTMENT_OTHER)
Admission: EM | Admit: 2024-03-02 | Discharge: 2024-03-02 | Disposition: A | Discharge status: Home / Self Care | Attending: Emergency Medicine | Admitting: Emergency Medicine

## 2024-03-02 DIAGNOSIS — Z23 Encounter for immunization: Secondary | ICD-10-CM | POA: Diagnosis not present

## 2024-03-02 DIAGNOSIS — S81011A Laceration without foreign body, right knee, initial encounter: Secondary | ICD-10-CM | POA: Diagnosis not present

## 2024-03-02 DIAGNOSIS — W503XXA Accidental bite by another person, initial encounter: Secondary | ICD-10-CM

## 2024-03-02 DIAGNOSIS — S80212A Abrasion, left knee, initial encounter: Secondary | ICD-10-CM | POA: Insufficient documentation

## 2024-03-02 DIAGNOSIS — S8991XA Unspecified injury of right lower leg, initial encounter: Secondary | ICD-10-CM | POA: Diagnosis present

## 2024-03-02 DIAGNOSIS — Z79899 Other long term (current) drug therapy: Secondary | ICD-10-CM | POA: Insufficient documentation

## 2024-03-02 DIAGNOSIS — W108XXA Fall (on) (from) other stairs and steps, initial encounter: Secondary | ICD-10-CM | POA: Insufficient documentation

## 2024-03-02 MED ORDER — OXYCODONE-ACETAMINOPHEN 5-325 MG PO TABS
1.0000 | ORAL_TABLET | ORAL | Status: DC | PRN
  Administered 2024-03-02: 1 via ORAL
  Filled 2024-03-02: qty 1

## 2024-03-02 MED ORDER — NAPROXEN 375 MG PO TABS
375.0000 mg | ORAL_TABLET | Freq: Two times a day (BID) | ORAL | 0 refills | Status: AC
Start: 2024-03-02 — End: ?

## 2024-03-02 MED ORDER — TETANUS-DIPHTH-ACELL PERTUSSIS 5-2.5-18.5 LF-MCG/0.5 IM SUSY
0.5000 mL | PREFILLED_SYRINGE | Freq: Once | INTRAMUSCULAR | Status: AC
  Administered 2024-03-02: 0.5 mL via INTRAMUSCULAR
  Filled 2024-03-02: qty 0.5

## 2024-03-02 MED ORDER — LIDOCAINE-EPINEPHRINE (PF) 2 %-1:200000 IJ SOLN
20.0000 mL | Freq: Once | INTRAMUSCULAR | Status: AC
  Administered 2024-03-02: 20 mL

## 2024-03-02 MED ORDER — LIDOCAINE-EPINEPHRINE-TETRACAINE (LET) TOPICAL GEL
3.0000 mL | Freq: Once | TOPICAL | Status: AC
  Administered 2024-03-02: 3 mL via TOPICAL
  Filled 2024-03-02: qty 3

## 2024-03-02 MED ORDER — DOXYCYCLINE HYCLATE 100 MG PO CAPS: 100.0000 mg | ORAL_CAPSULE | Freq: Two times a day (BID) | ORAL | 0 refills | Status: AC

## 2024-03-02 MED ORDER — LIDOCAINE-EPINEPHRINE 2 %-1:100000 IJ SOLN: 20.0000 mL | Freq: Once | INTRAMUSCULAR | Status: DC

## 2024-03-02 MED ORDER — DOXYCYCLINE HYCLATE 100 MG PO TABS
100.0000 mg | ORAL_TABLET | Freq: Once | ORAL | Status: AC
  Administered 2024-03-02: 100 mg via ORAL
  Filled 2024-03-02: qty 1

## 2024-03-02 NOTE — Discharge Instructions (Signed)
WOUND CARE Please have your stitches/staples removed in 10 days  or sooner if you have concerns. You may do this at any available urgent care or at your primary care doctor's office.  Keep area clean and dry for 24 hours. Do not remove bandage, if applied.  After 24 hours, remove bandage and wash wound gently with mild soap and warm water. Reapply a new bandage after cleaning wound, if directed.  Continue daily cleansing with soap and water until stitches/staples are removed.  Do not apply any ointments or creams to the wound while stitches/staples are in place, as this may cause delayed healing.  Seek medical careif you experience any of the following signs of infection: Swelling, redness, pus drainage, streaking, fever >101.0 F  Seek care if you experience excessive bleeding that does not stop after 15-20 minutes of constant, firm pressure.   
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
## 2024-03-02 NOTE — ED Notes (Signed)
 Wound clean and LET applied to right knee.

## 2024-03-12 ENCOUNTER — Emergency Department (HOSPITAL_BASED_OUTPATIENT_CLINIC_OR_DEPARTMENT_OTHER)
Admission: EM | Admit: 2024-03-12 | Discharge: 2024-03-12 | Disposition: A | Discharge status: Home / Self Care | Attending: Emergency Medicine | Admitting: Emergency Medicine

## 2024-03-12 ENCOUNTER — Encounter (HOSPITAL_BASED_OUTPATIENT_CLINIC_OR_DEPARTMENT_OTHER): Payer: Self-pay | Admitting: Emergency Medicine

## 2024-03-12 ENCOUNTER — Other Ambulatory Visit: Payer: Self-pay

## 2024-03-12 DIAGNOSIS — Z4802 Encounter for removal of sutures: Secondary | ICD-10-CM | POA: Insufficient documentation

## 2024-03-12 DIAGNOSIS — Z4801 Encounter for change or removal of surgical wound dressing: Secondary | ICD-10-CM | POA: Diagnosis present

## 2024-03-12 NOTE — ED Triage Notes (Signed)
 Sutures in right knee placed 03/02/24

## 2024-03-12 NOTE — ED Notes (Signed)
 Reviewed AVS/discharge instruction with patient. Time allotted for and all questions answered. Patient is agreeable for d/c and escorted to ed exit by staff.

## 2024-03-12 NOTE — Discharge Instructions (Signed)
 You are seen in the emergency department today for concerns of a wound check.  You had appropriate healing at this time so the sutures were removed.  I have placed Steri-Strips over the wound to keep tension over the area to prevent premature wound tearing.  Please continue to keep the area clean and if any signs of infection develop, return to emergency department.  Otherwise please follow-up with your primary care provider.

## 2024-03-12 NOTE — ED Provider Notes (Signed)
 [place] EMERGENCY DEPARTMENT AT [facility] Provider Note   CSN: [id number] Arrival date & time: 03/12/24  1803     History Chief Complaint  Patient presents with   Wound Check    Nicole [name] is a 48 y.o. female. Patient presents to the ED today with concerns of a wound check. She reports that she has had no complications from the sutures.  Does endorse some itching to the wound but denies any concerns for infection such as erythema, swelling, or purulent drainage.   Wound Check       Home Medications Prior to Admission medications   Medication Sig Start Date End Date Taking? Authorizing Provider  AIMOVIG 140 MG/ML SOAJ Inject 140 mg into the skin every 30 (thirty) days. 05/25/21   [provider]  cetirizine (ZYRTEC) 10 MG tablet Take 10 mg by mouth daily.    [provider]  cyclobenzaprine (FLEXERIL) 10 MG tablet Take 1 tablet (10 mg total) by mouth 2 (two) times daily as needed for muscle spasms. 03/05/23   Curatolo, Adam, DO  doxycycline (VIBRAMYCIN) 100 MG capsule Take 1 capsule (100 mg total) by mouth 2 (two) times daily. 03/02/24   Arthor Captain, PA-C  EPINEPHrine 0.3 mg/0.3 mL IJ SOAJ injection Inject 0.3 mg into the muscle as needed for anaphylaxis.    [provider]  ergocalciferol (VITAMIN D2) 1.25 MG (50000 UT) capsule Take 1 capsule (50,000 Units total) by mouth once a week. 08/14/23     fluticasone (FLONASE) 50 MCG/ACT nasal spray Place 2 sprays into both nostrils daily. 12/21/20   [provider]  HYDROcodone-acetaminophen (NORCO/VICODIN) 5-325 MG tablet Take 1 tablet by mouth every 6 (six) hours as needed for severe pain. 06/19/23   Mardella Layman, MD  ibuprofen (ADVIL) 800 MG tablet Take 1 tablet (800 mg total) by mouth 3 (three) times daily. 01/23/23   Hermina Staggers, MD  levETIRAcetam (KEPPRA) 1000 MG tablet Take 1,000-2,000 mg by mouth in the morning, at noon, and at bedtime. 04/08/21   [provider]   methocarbamol (ROBAXIN) 500 MG tablet Take 1 tablet (500 mg total) by mouth every 8 (eight) hours as needed for muscle spasms. 02/03/23   Benjiman Core, MD  montelukast (SINGULAIR) 10 MG tablet Take 1 tablet (10 mg total) by mouth at bedtime. 07/03/19   Cirigliano, Jearld Lesch, DO  naloxone (NARCAN) nasal spray 4 mg/0.1 mL Use 1 spray every 3 minutes; spray 1 dose into ONE nostril; alternate nostrils with each dose until help arrives. 07/17/23     naloxone (NARCAN) nasal spray 4 mg/0.1 mL Spray 1 dose into ONE nostril every 3 minutes until help arrives. Alternate nostrils with each dose. 11/13/23     naproxen (NAPROSYN) 375 MG tablet Take 1 tablet (375 mg total) by mouth 2 (two) times daily with a meal. 03/02/24   Arthor Captain, PA-C  oxyCODONE-acetaminophen (PERCOCET) 10-325 MG tablet Take 1 tablet by mouth 3 (three) times daily. 09/13/23     oxyCODONE-acetaminophen (PERCOCET) 10-325 MG tablet Take 1 tablet by mouth 3 (three) times daily. 02/11/24     SUMAtriptan (IMITREX) 100 MG tablet Take 100 mg by mouth every 2 (two) hours as needed for migraine. 04/08/21   [provider]  ALAYCEN 1/35 tablet TAKE 1 TABLET BY MOUTH EVERY DAY Patient not taking: No sig reported 08/07/19 06/23/21  Brock Bad, MD  famotidine (PEPCID) 20 MG tablet Take 1 tablet (20 mg total) by mouth daily. Patient not taking: No  sig reported 11/13/19 06/23/21  Belinda Fisher, PA-C  ferrous sulfate 325 (65 FE) MG tablet Take 1 tablet (325 mg total) by mouth daily. Patient not taking: No sig reported 12/29/18 06/23/21  Petrucelli, Samantha R, PA-C  ipratropium (ATROVENT) 0.06 % nasal spray Place 2 sprays into both nostrils 4 (four) times daily. Patient not taking: No sig reported 11/13/19 06/23/21  Belinda Fisher, PA-C  medroxyPROGESTERone (DEPO-PROVERA) 150 MG/ML injection INJECT 1 ML (150 MG TOTAL) INTO THE MUSCLE EVERY 3 (THREE) MONTHS. Patient not taking: No sig reported 03/03/20 06/23/21  Brock Bad, MD  topiramate (TOPAMAX) 25  MG tablet Take 3 tablets (75 mg total) by mouth 2 (two) times daily. Patient not taking: No sig reported 07/07/18 06/23/21  Molpus, Jonny Ruiz, MD      Allergies    Green tea leaf ext, Penicillins, Shellfish allergy, Shrimp extract, Sulfa antibiotics, Tea, and Sulfamethoxazole    Review of Systems   Review of Systems  Skin:  Positive for wound.  All other systems reviewed and are negative.   Physical Exam Updated Vital Signs BP (!) 146/93 (BP Location: Right Arm)   Pulse 67   Temp 98.6 F (37 C)   Resp 16   LMP 11/07/2022 (Exact Date)   SpO2 97%  Physical Exam Vitals and nursing note reviewed.  Constitutional:      General: She is not in acute distress.    Appearance: She is well-developed.  HENT:     Head: Normocephalic and atraumatic.  Eyes:     Conjunctiva/sclera: Conjunctivae normal.  Cardiovascular:     Rate and Rhythm: Normal rate and regular rhythm.     Heart sounds: No murmur heard. Pulmonary:     Effort: Pulmonary effort is normal. No respiratory distress.     Breath sounds: Normal breath sounds.  Abdominal:     Palpations: Abdomen is soft.     Tenderness: There is no abdominal tenderness.  Musculoskeletal:        General: No swelling.     Cervical back: Neck supple.  Skin:    General: Skin is warm and dry.     Capillary Refill: Capillary refill takes less than 2 seconds.     Findings: Lesion present.     Comments: Right knee wound well healing with no signs of infection.  Neurological:     Mental Status: She is alert.  Psychiatric:        Mood and Affect: Mood normal.     ED Results / Procedures / Treatments   Labs (all labs ordered are listed, but only abnormal results are displayed) Labs Reviewed - No data to display  EKG None  Radiology No results found.  Procedures Suture Removal  Date/Time: 03/12/2024 6:41 PM  Performed by: Smitty Knudsen, PA-C Authorized by: Smitty Knudsen, PA-C   Consent:    Consent obtained:  Verbal   Consent given  by:  Patient   Risks discussed:  Bleeding and pain   Alternatives discussed:  Delayed treatment Universal protocol:    Patient identity confirmed:  Verbally with patient and arm band Location:    Location:  Lower extremity   Lower extremity location:  Knee   Knee location:  R knee Procedure details:    Wound appearance:  No signs of infection, good wound healing, clean, moist and pink   Number of sutures removed:  3 Post-procedure details:    Post-removal:  Dressing applied and Steri-Strips applied   Procedure completion:  Tolerated  Medications Ordered in ED Medications - No data to display  ED Course/ Medical Decision Making/ A&P                                 Medical Decision Making   Problem List / ED Course:  Patient presents to the emergency department today for concerns of a wound check.  Reports that she had sutures placed on 03/02/2024.  Here for suture removal. On exam, the wound is well-appearing with no evidence of infection.  No signs of wound dehiscence. Wound still well approximated. Will apply Steri-Strips to the area given area of hypertension with frequent flexion extension of the knee as patient is still bearing weight.  He was patient declined emergency department for any concerns or new or worsening symptoms.  Patient was stable for outpatient follow-up and discharged home.  Final Clinical Impression(s) / ED Diagnoses Final diagnoses:  Visit for suture removal    Rx / DC Orders ED Discharge Orders     None         Salomon Mast 03/12/24 1841    Terald Sleeper, MD 03/12/24 [phone]

## 2024-03-13 ENCOUNTER — Other Ambulatory Visit (HOSPITAL_BASED_OUTPATIENT_CLINIC_OR_DEPARTMENT_OTHER): Payer: Self-pay

## 2024-03-13 MED ORDER — OXYCODONE-ACETAMINOPHEN 10-325 MG PO TABS
1.0000 | ORAL_TABLET | Freq: Three times a day (TID) | ORAL | 0 refills | Status: DC
Start: 2024-03-13 — End: 2024-04-11
  Filled 2024-03-15: qty 90, 30d supply, fill #0

## 2024-03-15 ENCOUNTER — Other Ambulatory Visit (HOSPITAL_BASED_OUTPATIENT_CLINIC_OR_DEPARTMENT_OTHER): Payer: Self-pay

## 2024-04-11 ENCOUNTER — Other Ambulatory Visit (HOSPITAL_BASED_OUTPATIENT_CLINIC_OR_DEPARTMENT_OTHER): Payer: Self-pay

## 2024-04-11 MED ORDER — OXYCODONE-ACETAMINOPHEN 10-325 MG PO TABS
1.0000 | ORAL_TABLET | Freq: Three times a day (TID) | ORAL | 0 refills | Status: AC
  Filled 2024-04-11 – 2024-04-12 (×2): qty 90, 30d supply, fill #0

## 2024-04-12 ENCOUNTER — Other Ambulatory Visit (HOSPITAL_BASED_OUTPATIENT_CLINIC_OR_DEPARTMENT_OTHER): Payer: Self-pay

## 2024-05-13 ENCOUNTER — Other Ambulatory Visit (HOSPITAL_BASED_OUTPATIENT_CLINIC_OR_DEPARTMENT_OTHER): Payer: Self-pay

## 2024-05-13 MED ORDER — OXYCODONE-ACETAMINOPHEN 10-325 MG PO TABS
1.0000 | ORAL_TABLET | Freq: Three times a day (TID) | ORAL | 0 refills | Status: DC | PRN
  Filled 2024-05-14: qty 90, 30d supply, fill #0

## 2024-05-13 MED ORDER — NALOXONE HCL 4 MG/0.1ML NA LIQD
NASAL | 0 refills | Status: AC
  Filled 2024-05-13: qty 2, 1d supply, fill #0

## 2024-05-14 ENCOUNTER — Other Ambulatory Visit (HOSPITAL_BASED_OUTPATIENT_CLINIC_OR_DEPARTMENT_OTHER): Payer: Self-pay

## 2024-06-08 ENCOUNTER — Emergency Department (HOSPITAL_BASED_OUTPATIENT_CLINIC_OR_DEPARTMENT_OTHER)
Admission: EM | Admit: 2024-06-08 | Discharge: 2024-06-08 | Disposition: A | Discharge status: Home / Self Care | Attending: Emergency Medicine | Admitting: Emergency Medicine

## 2024-06-08 ENCOUNTER — Encounter (HOSPITAL_BASED_OUTPATIENT_CLINIC_OR_DEPARTMENT_OTHER): Payer: Self-pay | Admitting: Emergency Medicine

## 2024-06-08 ENCOUNTER — Emergency Department (HOSPITAL_BASED_OUTPATIENT_CLINIC_OR_DEPARTMENT_OTHER): Admitting: Radiology

## 2024-06-08 ENCOUNTER — Other Ambulatory Visit: Payer: Self-pay

## 2024-06-08 DIAGNOSIS — Z7951 Long term (current) use of inhaled steroids: Secondary | ICD-10-CM | POA: Insufficient documentation

## 2024-06-08 DIAGNOSIS — R0789 Other chest pain: Secondary | ICD-10-CM | POA: Insufficient documentation

## 2024-06-08 DIAGNOSIS — R079 Chest pain, unspecified: Secondary | ICD-10-CM

## 2024-06-08 DIAGNOSIS — Z8673 Personal history of transient ischemic attack (TIA), and cerebral infarction without residual deficits: Secondary | ICD-10-CM | POA: Diagnosis not present

## 2024-06-08 DIAGNOSIS — J45909 Unspecified asthma, uncomplicated: Secondary | ICD-10-CM | POA: Diagnosis not present

## 2024-06-08 DIAGNOSIS — R0602 Shortness of breath: Secondary | ICD-10-CM | POA: Diagnosis not present

## 2024-06-08 LAB — CBC
HCT: 32.8 % — ABNORMAL LOW (ref 36.0–46.0)
Hemoglobin: 11 g/dL — ABNORMAL LOW (ref 12.0–15.0)
MCH: 28.1 pg (ref 26.0–34.0)
MCHC: 33.5 g/dL (ref 30.0–36.0)
MCV: 83.7 fL (ref 80.0–100.0)
Platelets: 297 10*3/uL (ref 150–400)
RBC: 3.92 MIL/uL (ref 3.87–5.11)
RDW: 13.4 % (ref 11.5–15.5)
WBC: 7.1 10*3/uL (ref 4.0–10.5)
nRBC: 0 % (ref 0.0–0.2)

## 2024-06-08 LAB — BASIC METABOLIC PANEL WITH GFR
Anion gap: 10 (ref 5–15)
BUN: 6 mg/dL (ref 6–20)
CO2: 22 mmol/L (ref 22–32)
Calcium: 9.2 mg/dL (ref 8.9–10.3)
Chloride: 104 mmol/L (ref 98–111)
Creatinine, Ser: 0.83 mg/dL (ref 0.44–1.00)
GFR, Estimated: >60 mL/min (ref >60)
Glucose, Bld: 80 mg/dL (ref 70–99)
Potassium: 3.5 mmol/L (ref 3.5–5.1)
Sodium: 136 mmol/L (ref 135–145)

## 2024-06-08 LAB — TROPONIN T, HIGH SENSITIVITY: Troponin T High Sensitivity: <15 ng/L (ref <19)

## 2024-06-08 MED ORDER — KETOROLAC TROMETHAMINE 15 MG/ML IJ SOLN
15.0000 mg | Freq: Once | INTRAMUSCULAR | Status: AC
  Administered 2024-06-08: 15 mg via INTRAVENOUS
  Filled 2024-06-08: qty 1

## 2024-06-08 NOTE — ED Provider Notes (Signed)
 [place] EMERGENCY DEPARTMENT AT [facility] Provider Note   CSN: [id number] Arrival date & time: 06/08/24  1436     Patient presents with: Chest Pain   Nicole [name] is a 48 y.o. female.    Chest Pain Patient presents with left-sided chest pain.  Began while at family member's funeral earlier today.  Now going to left arm.  States left arm feels a little tingly.  Worsens with movements.  Mild shortness of breath.  No cough.  No swelling of legs.    Past Medical History:  Diagnosis Date   Anemia    Asthma    Atypical chest pain    a. ?pericarditis 2015.   Blood transfusion 2011   r/t anemia   Fibroid    Migraine    Rheumatoid arthritis (HCC)    Seizures (HCC)    Sickle cell trait (HCC)    Stroke [facility]) 2008   Tooth abscess     Prior to Admission medications   Medication Sig Start Date End Date Taking? Authorizing Provider  AIMOVIG 140 MG/ML SOAJ Inject 140 mg into the skin every 30 (thirty) days. 05/25/21   [provider]  cetirizine (ZYRTEC) 10 MG tablet Take 10 mg by mouth daily.    [provider]  cyclobenzaprine  (FLEXERIL ) 10 MG tablet Take 1 tablet (10 mg total) by mouth 2 (two) times daily as needed for muscle spasms. 03/05/23   Curatolo, Adam, DO  doxycycline  (VIBRAMYCIN ) 100 MG capsule Take 1 capsule (100 mg total) by mouth 2 (two) times daily. 03/02/24   Arloa Chroman, PA-C  EPINEPHrine  0.3 mg/0.3 mL IJ SOAJ injection Inject 0.3 mg into the muscle as needed for anaphylaxis.    [provider]  ergocalciferol  (VITAMIN D2) 1.25 MG (50000 UT) capsule Take 1 capsule (50,000 Units total) by mouth once a week. 08/14/23     fluticasone  (FLONASE ) 50 MCG/ACT nasal spray Place 2 sprays into both nostrils daily. 12/21/20   [provider]  HYDROcodone -acetaminophen  (NORCO/VICODIN) 5-325 MG tablet Take 1 tablet by mouth every 6 (six) hours as needed for severe pain. 06/19/23   Rolinda Rogue, MD  ibuprofen  (ADVIL ) 800 MG tablet Take  1 tablet (800 mg total) by mouth 3 (three) times daily. 01/23/23   Ervin, Michael L, MD  levETIRAcetam  (KEPPRA ) 1000 MG tablet Take 1,000-2,000 mg by mouth in the morning, at noon, and at bedtime. 04/08/21   [provider]  methocarbamol  (ROBAXIN ) 500 MG tablet Take 1 tablet (500 mg total) by mouth every 8 (eight) hours as needed for muscle spasms. 02/03/23   Patsey Lot, MD  montelukast  (SINGULAIR ) 10 MG tablet Take 1 tablet (10 mg total) by mouth at bedtime. 07/03/19   Cirigliano, Ronal POUR, DO  naloxone  (NARCAN ) nasal spray 4 mg/0.1 mL Use 1 spray every 3 minutes; spray 1 dose into ONE nostril; alternate nostrils with each dose until help arrives. 07/17/23     naloxone  (NARCAN ) nasal spray 4 mg/0.1 mL Spray 1 dose into ONE nostril every 3 minutes until help arrives. Alternate nostrils with each dose. 11/13/23     naloxone  (NARCAN ) nasal spray 4 mg/0.1 mL Place 1 spray into ONE nostril every 3 minutes. Alternate nostrils with each dose until help arrives. 05/13/24     naproxen  (NAPROSYN ) 375 MG tablet Take 1 tablet (375 mg total) by mouth 2 (two) times daily with a meal. 03/02/24   Harris, Abigail, PA-C  oxyCODONE -acetaminophen  (PERCOCET) 10-325 MG tablet Take 1 tablet by mouth 3 (three)  times daily. 09/13/23     oxyCODONE -acetaminophen  (PERCOCET) 10-325 MG tablet Take 1 tablet by mouth 3 (three) times daily. 04/11/24     oxyCODONE -acetaminophen  (PERCOCET) 10-325 MG tablet Take 1 tablet by mouth every 8 (eight) hours as needed. Max daily dose of 3 tablets. 05/13/24     SUMAtriptan  (IMITREX ) 100 MG tablet Take 100 mg by mouth every 2 (two) hours as needed for migraine. 04/08/21   [provider]  ALAYCEN 1/35 tablet TAKE 1 TABLET BY MOUTH EVERY DAY Patient not taking: No sig reported 08/07/19 06/23/21  Rudy Carlin LABOR, MD  famotidine  (PEPCID ) 20 MG tablet Take 1 tablet (20 mg total) by mouth daily. Patient not taking: No sig reported 11/13/19 06/23/21  Babara Greig GAILS, PA-C  ferrous sulfate  325 (65  FE) MG tablet Take 1 tablet (325 mg total) by mouth daily. Patient not taking: No sig reported 12/29/18 06/23/21  Petrucelli, Samantha R, PA-C  ipratropium (ATROVENT ) 0.06 % nasal spray Place 2 sprays into both nostrils 4 (four) times daily. Patient not taking: No sig reported 11/13/19 06/23/21  Babara Greig GAILS, PA-C  medroxyPROGESTERone  (DEPO-PROVERA ) 150 MG/ML injection INJECT 1 ML (150 MG TOTAL) INTO THE MUSCLE EVERY 3 (THREE) MONTHS. Patient not taking: No sig reported 03/03/20 06/23/21  Rudy Carlin LABOR, MD  topiramate  (TOPAMAX ) 25 MG tablet Take 3 tablets (75 mg total) by mouth 2 (two) times daily. Patient not taking: No sig reported 07/07/18 06/23/21  Molpus, John, MD    Allergies: Green tea leaf ext, Penicillins, Shellfish allergy, Shrimp extract, Sulfa antibiotics, Tea, and Sulfamethoxazole    Review of Systems  Cardiovascular:  Positive for chest pain.    Updated Vital Signs BP 124/79   Pulse 76   Temp 98.3 F (36.8 C) (Oral)   Resp 16   LMP 11/07/2022 (Exact Date)   SpO2 99%   Physical Exam Vitals and nursing note reviewed.   Cardiovascular:     Rate and Rhythm: Normal rate.  Pulmonary:     Breath sounds: No wheezing, rhonchi or rales.  Chest:     Chest wall: Tenderness present.   Musculoskeletal:     Right lower leg: No edema.     Left lower leg: No edema.   Skin:    General: Skin is warm.   Neurological:     Mental Status: She is alert.     (all labs ordered are listed, but only abnormal results are displayed) Labs Reviewed  CBC - Abnormal; Notable for the following components:      Result Value   Hemoglobin 11.0 (*)    HCT 32.8 (*)    All other components within normal limits  BASIC METABOLIC PANEL WITH GFR  TROPONIN T, HIGH SENSITIVITY    EKG: EKG Interpretation Date/Time:  Sunday June 08 2024 14:44:14 EDT Ventricular Rate:  78 PR Interval:  178 QRS Duration:  80 QT Interval:  410 QTC Calculation: 461 R Axis:   37  Text Interpretation: Sinus  rhythm Low voltage, precordial leads Confirmed by Patsey Lot [phone]) on 06/08/2024 3:15:54 PM  Radiology: [facility] Chest 2 View Result Date: 06/08/2024 CLINICAL DATA:  Acute left-sided chest pain beginning today. EXAM: CHEST - 2 VIEW COMPARISON:  01/05/2024 FINDINGS: The heart size and mediastinal contours are within normal limits. Both lungs are clear. The visualized skeletal structures are unremarkable. IMPRESSION: No active cardiopulmonary disease. Electronically Signed   By: Norleen LABOR Kil M.D.   On: 06/08/2024 16:55     Procedures   Medications Ordered  in the ED  ketorolac  (TORADOL ) 15 MG/ML injection 15 mg (15 mg Intravenous Given 06/08/24 1547)                                    Medical Decision Making Amount and/or Complexity of Data Reviewed Labs: ordered. Radiology: ordered.  Risk Prescription drug management.   Patient with chest pain.  Left chest.  EKG reassuring.  Differential diagnosis includes anxiety, musculoskeletal pain, pneumonia, coronary artery disease.  I have seen this patient about a year and a half ago for similar symptoms.  Musculoskeletal at time.  Will give dose of Toradol .  Will get x-ray blood work.  EKG reassuring.  Blood work x-ray and EKG all reassuring.  I think most likely musculoskeletal pain or may be a component of anxiety with the event occurring at a funeral.  Appears stable for discharge home however.      Final diagnoses:  Chest pain, unspecified type    ED Discharge Orders     None          Patsey Lot, MD 06/08/24 2205

## 2024-06-08 NOTE — ED Notes (Signed)
 Dc instructions reviewed with patient. Patient voiced understanding. Dc with belongings.

## 2024-06-08 NOTE — ED Triage Notes (Signed)
 Left side chest pain, down left arm Some sob started today this AM

## 2024-06-12 ENCOUNTER — Other Ambulatory Visit (HOSPITAL_BASED_OUTPATIENT_CLINIC_OR_DEPARTMENT_OTHER): Payer: Self-pay

## 2024-06-12 MED ORDER — OXYCODONE-ACETAMINOPHEN 10-325 MG PO TABS
1.0000 | ORAL_TABLET | Freq: Three times a day (TID) | ORAL | 0 refills | Status: DC | PRN
  Filled 2024-06-12: qty 90, 30d supply, fill #0

## 2024-06-12 MED ORDER — NALOXONE HCL 4 MG/0.1ML NA LIQD
NASAL | 0 refills | Status: AC
  Filled 2024-06-12: qty 2, 1d supply, fill #0

## 2024-07-11 ENCOUNTER — Other Ambulatory Visit (HOSPITAL_BASED_OUTPATIENT_CLINIC_OR_DEPARTMENT_OTHER): Payer: Self-pay

## 2024-07-11 ENCOUNTER — Encounter (HOSPITAL_BASED_OUTPATIENT_CLINIC_OR_DEPARTMENT_OTHER): Payer: Self-pay

## 2024-07-11 ENCOUNTER — Emergency Department (HOSPITAL_BASED_OUTPATIENT_CLINIC_OR_DEPARTMENT_OTHER)
Admission: EM | Admit: 2024-07-11 | Discharge: 2024-07-11 | Disposition: A | Discharge status: Home / Self Care | Attending: Emergency Medicine | Admitting: Emergency Medicine

## 2024-07-11 ENCOUNTER — Other Ambulatory Visit: Payer: Self-pay

## 2024-07-11 DIAGNOSIS — M5416 Radiculopathy, lumbar region: Secondary | ICD-10-CM | POA: Diagnosis not present

## 2024-07-11 DIAGNOSIS — N3000 Acute cystitis without hematuria: Secondary | ICD-10-CM | POA: Diagnosis not present

## 2024-07-11 DIAGNOSIS — S39012A Strain of muscle, fascia and tendon of lower back, initial encounter: Secondary | ICD-10-CM | POA: Insufficient documentation

## 2024-07-11 DIAGNOSIS — S3992XA Unspecified injury of lower back, initial encounter: Secondary | ICD-10-CM | POA: Diagnosis present

## 2024-07-11 DIAGNOSIS — X58XXXA Exposure to other specified factors, initial encounter: Secondary | ICD-10-CM | POA: Insufficient documentation

## 2024-07-11 LAB — URINALYSIS, ROUTINE W REFLEX MICROSCOPIC
Bilirubin Urine: NEGATIVE
Glucose, UA: NEGATIVE mg/dL
Ketones, ur: NEGATIVE mg/dL
Nitrite: NEGATIVE
Protein, ur: NEGATIVE mg/dL
Specific Gravity, Urine: 1.011 (ref 1.005–1.030)
pH: 7 (ref 5.0–8.0)

## 2024-07-11 LAB — PREGNANCY, URINE: Preg Test, Ur: NEGATIVE

## 2024-07-11 MED ORDER — NITROFURANTOIN MONOHYD MACRO 100 MG PO CAPS
100.0000 mg | ORAL_CAPSULE | Freq: Two times a day (BID) | ORAL | 0 refills | Status: AC
  Filled 2024-07-11: qty 9, 5d supply, fill #0

## 2024-07-11 MED ORDER — NITROFURANTOIN MONOHYD MACRO 100 MG PO CAPS
100.0000 mg | ORAL_CAPSULE | Freq: Once | ORAL | Status: AC
  Administered 2024-07-11: 100 mg via ORAL
  Filled 2024-07-11: qty 1

## 2024-07-11 NOTE — ED Notes (Signed)
 DC paperwork given and verbally understood.

## 2024-07-11 NOTE — Discharge Instructions (Signed)
 Please engage in light physical activity (like walking) to prevent your back pain from worsening and to prevent stiffness. Refrain from bedrest which can make your pain worse.   You may use up to 600mg  ibuprofen  every 6 hours as needed for pain.  Do not exceed 2.4g of ibuprofen  per day.  You may take up to 1000mg  of tylenol  every 6 hours as needed for pain.  Do not take more then 4g per day.  You may use a heating back on your back to help with the pain.  Please follow-up with your back doctor if pain is not improving on its own within the next 2 weeks.  You have been prescribed an antibiotic called nitrofurantoin  (Macrobid ) to treat your urinary tract infection. Take this antibiotic 2 times a day for 5 days.  You were given your first dose here today.  Take your next dose this evening.  Take the full course of your antibiotic even if you start feeling better. Antibiotics may cause you to have diarrhea.   Return to the ER if you have loss of bowel or bladder control, you develop fever or chills, you become very dizzy, you have shortness of breath, chest pain, severe abdominal pain, any other new or concerning symptoms

## 2024-07-11 NOTE — ED Triage Notes (Signed)
 Left lower back pain radiating down left leg onset 1 week ago. Reports strong odor with urinating. Denies dysuria or hematuria. Denies vaginal discharge. BM reported to be normal for her.

## 2024-07-11 NOTE — ED Provider Notes (Signed)
 [place] EMERGENCY DEPARTMENT AT [facility] Provider Note   CSN: [id number] Arrival date & time: 07/11/24  9144     Patient presents with: Back Pain   Nicole [name] is a 47 y.o. female with history of CVA with left lower extremity weakness, presents with concern for left lower back pain that started about a week ago.  She denies any injuries such as falls. Reports pain intermittently shoots down the left leg.  Denies any numbness or new weakness in the left lower extremity.  Denies any swelling in her legs.  Denies any saddle anesthesia, bowel or bladder incontinence, urinary retention.  Denies any fevers, IV drug use, or history of malignancy.  Also reports her urine has had a stronger odor than normal for the past week as well.  She has tried drinking cranberry juice without relief of symptoms.  Denies any dysuria or hematuria.  Denies any abnormal vaginal discharge.  Reports normal bowel movements.   HPI     Prior to Admission medications   Medication Sig Start Date End Date Taking? Authorizing Provider  levETIRAcetam  (KEPPRA ) 1000 MG tablet Take 1,000-2,000 mg by mouth in the morning, at noon, and at bedtime. 04/08/21  Yes [provider]  nitrofurantoin , macrocrystal-monohydrate, (MACROBID ) 100 MG capsule Take 1 capsule (100 mg total) by mouth 2 (two) times daily. 07/11/24  Yes Veta Palma, PA-C  AIMOVIG 140 MG/ML SOAJ Inject 140 mg into the skin every 30 (thirty) days. 05/25/21   [provider]  cetirizine (ZYRTEC) 10 MG tablet Take 10 mg by mouth daily.    [provider]  cyclobenzaprine  (FLEXERIL ) 10 MG tablet Take 1 tablet (10 mg total) by mouth 2 (two) times daily as needed for muscle spasms. 03/05/23   Curatolo, Adam, DO  doxycycline  (VIBRAMYCIN ) 100 MG capsule Take 1 capsule (100 mg total) by mouth 2 (two) times daily. 03/02/24   Arloa Chroman, PA-C  EPINEPHrine  0.3 mg/0.3 mL IJ SOAJ injection Inject 0.3 mg into the muscle as needed for  anaphylaxis.    [provider]  ergocalciferol  (VITAMIN D2) 1.25 MG (50000 UT) capsule Take 1 capsule (50,000 Units total) by mouth once a week. 08/14/23     fluticasone  (FLONASE ) 50 MCG/ACT nasal spray Place 2 sprays into both nostrils daily. 12/21/20   [provider]  HYDROcodone -acetaminophen  (NORCO/VICODIN) 5-325 MG tablet Take 1 tablet by mouth every 6 (six) hours as needed for severe pain. 06/19/23   Rolinda Rogue, MD  ibuprofen  (ADVIL ) 800 MG tablet Take 1 tablet (800 mg total) by mouth 3 (three) times daily. 01/23/23   Ervin, Michael L, MD  methocarbamol  (ROBAXIN ) 500 MG tablet Take 1 tablet (500 mg total) by mouth every 8 (eight) hours as needed for muscle spasms. 02/03/23   Patsey Lot, MD  montelukast  (SINGULAIR ) 10 MG tablet Take 1 tablet (10 mg total) by mouth at bedtime. 07/03/19   Cirigliano, Ronal POUR, DO  naloxone  (NARCAN ) nasal spray 4 mg/0.1 mL Use 1 spray every 3 minutes; spray 1 dose into ONE nostril; alternate nostrils with each dose until help arrives. 07/17/23     naloxone  (NARCAN ) nasal spray 4 mg/0.1 mL Spray 1 dose into ONE nostril every 3 minutes until help arrives. Alternate nostrils with each dose. 11/13/23     naloxone  (NARCAN ) nasal spray 4 mg/0.1 mL Place 1 spray into ONE nostril every 3 minutes. Alternate nostrils with each dose until help arrives. 05/13/24     naloxone  (NARCAN ) nasal spray 4 mg/0.1 mL Use  1 spray every 3 minutes; spray 1 dose into ONE nostril; alternate nostrils w/each dose until help arrives 06/12/24     naproxen  (NAPROSYN ) 375 MG tablet Take 1 tablet (375 mg total) by mouth 2 (two) times daily with a meal. 03/02/24   Harris, Abigail, PA-C  oxyCODONE -acetaminophen  (PERCOCET) 10-325 MG tablet Take 1 tablet by mouth 3 (three) times daily. 09/13/23     oxyCODONE -acetaminophen  (PERCOCET) 10-325 MG tablet Take 1 tablet by mouth 3 (three) times daily. 04/11/24     oxyCODONE -acetaminophen  (PERCOCET) 10-325 MG tablet Take 1 tablet by mouth every 8  (eight) hours as needed. Max daily dose: 3 tablets. 06/12/24     SUMAtriptan  (IMITREX ) 100 MG tablet Take 100 mg by mouth every 2 (two) hours as needed for migraine. 04/08/21   [provider]  ALAYCEN 1/35 tablet TAKE 1 TABLET BY MOUTH EVERY DAY Patient not taking: No sig reported 08/07/19 06/23/21  Rudy Carlin LABOR, MD  famotidine  (PEPCID ) 20 MG tablet Take 1 tablet (20 mg total) by mouth daily. Patient not taking: No sig reported 11/13/19 06/23/21  Babara Greig GAILS, PA-C  ferrous sulfate  325 (65 FE) MG tablet Take 1 tablet (325 mg total) by mouth daily. Patient not taking: No sig reported 12/29/18 06/23/21  Petrucelli, Samantha R, PA-C  ipratropium (ATROVENT ) 0.06 % nasal spray Place 2 sprays into both nostrils 4 (four) times daily. Patient not taking: No sig reported 11/13/19 06/23/21  Babara Greig GAILS, PA-C  medroxyPROGESTERone  (DEPO-PROVERA ) 150 MG/ML injection INJECT 1 ML (150 MG TOTAL) INTO THE MUSCLE EVERY 3 (THREE) MONTHS. Patient not taking: No sig reported 03/03/20 06/23/21  Rudy Carlin LABOR, MD  topiramate  (TOPAMAX ) 25 MG tablet Take 3 tablets (75 mg total) by mouth 2 (two) times daily. Patient not taking: No sig reported 07/07/18 06/23/21  Molpus, John, MD    Allergies: Green tea leaf ext, Penicillins, Shellfish allergy, Shrimp extract, Sulfa antibiotics, Tea, and Sulfamethoxazole    Review of Systems  Musculoskeletal:  Positive for back pain.    Updated Vital Signs BP (!) 140/97 (BP Location: Right Arm)   Pulse 69   Temp 98.3 F (36.8 C) (Oral)   Resp 20   Ht 5' 5 (1.651 m)   Wt 122 kg   LMP 11/07/2022 (Exact Date)   SpO2 96%   BMI 44.76 kg/m   Physical Exam Vitals and nursing note reviewed.  Constitutional:      Appearance: Normal appearance.  HENT:     Head: Atraumatic.  Cardiovascular:     Rate and Rhythm: Normal rate and regular rhythm.     Comments: 2+ pedal pulses bilaterally Pulmonary:     Effort: Pulmonary effort is normal.  Abdominal:     General: Abdomen is  flat.     Palpations: Abdomen is soft.     Tenderness: There is no abdominal tenderness. There is no right CVA tenderness or left CVA tenderness.  Musculoskeletal:     Comments: No cervical, thoracic, or lumbar spinal tenderness to palpation.  She is tender to the musculature of the lower thoracic and lumbar musculature diffusely on the left side.  Also tender in the left gluteal muscle.  She has full range of motion of the bilateral upper and lower extremities.  Able to ambulate at baseline, has mild antalgic gait due to pain.  No edema in the bilateral lower extremities.  No calf tenderness to palpation bilaterally  Neurological:     General: No focal deficit present.     Mental  Status: She is alert.     Comments: 5/5 strength of the right lower extremity with resisted hip flexion, knee flexion and extension, ankle plantarflexion dorsiflexion.  4/5 strength of the left lower extremity with resisted hip flexion, knee flexion and extension, ankle plantarflexion dorsiflexion.  Patient reports this is baseline for her due to previous stroke.  Intact sensation of the bilateral lower extremities  Psychiatric:        Mood and Affect: Mood normal.        Behavior: Behavior normal.     (all labs ordered are listed, but only abnormal results are displayed) Labs Reviewed  URINALYSIS, ROUTINE W REFLEX MICROSCOPIC - Abnormal; Notable for the following components:      Result Value   APPearance HAZY (*)    Hgb urine dipstick TRACE (*)    Leukocytes,Ua MODERATE (*)    Bacteria, UA MANY (*)    All other components within normal limits  URINE CULTURE  PREGNANCY, URINE    EKG: None  Radiology: No results found.   Procedures   Medications Ordered in the ED  nitrofurantoin  (macrocrystal-monohydrate) (MACROBID ) capsule 100 mg (has no administration in time range)                                    Medical Decision Making Amount and/or Complexity of Data Reviewed Labs:  ordered.  Risk Prescription drug management.     Differential diagnosis includes but is not limited to Musculoskeletal pain, radiculopathy, spinal stenosis, herniated nucleus pulposis, fracture, cauda equina, epidural abscess, shingles, nephrolithiasis    ED Course:  Upon initial evaluation, patient is well-appearing, no acute distress.  Stable vitals aside from mildly elevated blood pressure 140/97.  Reporting some left lower back pain.  She is tender to the left lumbar and gluteal musculature diffusely.  Nontender of the cervical, thoracic, or lumbar spine.  She is at baseline strength in the bilateral lower extremities, intact sensation in the bilateral lower extremities.  Able to ambulate without difficulty.  She denies any injuries, low concern for acute fracture or other spinal bony abnormality.  No indication for imaging at this time.  Exam is consistent with musculoskeletal pain.  She also reports some pain that shoots down the leg, suspect component of radiculopathy.  She does not have any calf tenderness palpation or edema in the lower extremities, low concern for DVT.  No red flag signs such as saddle esthesia, bowel or bladder incontinence, urinary tension, no history of malignancy or IV drug use, no fevers, low concern for emergent back pain pathology such as cauda equina, epidural abscess, fracture at this time. She also reports some urinary odor for the past week.  Will obtain urine sample to look for possible UTI.  She denies any abdominal pain, abdomen soft and non-tender to palpation.  No CVA tenderness bilaterally.  Stable vitals.  Low concern for acute intra-abdominal pathology at this time.  Do not feel labs are needed at this time.  Labs Ordered: I Ordered, and personally interpreted labs.  The pertinent results include:   Urinalysis with hazy appearing urine, moderate amount of leukocytes.  No squamous epithelial cells. Urine pregnancy negative  Medications  Given: Nitrofurantoin   Upon re-evaluation, patient remains well-appearing with stable vitals.  Discussed that her urine does show signs of urinary tract infection, given the odor she has noticed, will treat for uncomplicated cystitis.  The pain in her back seems  more musculoskeletal, no fevers or CVA tenderness, low concern for pyelonephritis.   I discussed treatment options for her back pain including Tylenol  and ibuprofen , heating packs, muscle relaxer.  Patient declines any medication at this time and states she will continue with physical activity to try and help her back pain.  She states she has a doctor for her chronic back pain issues and full follow-up with them if this does not start to improve on its own within the next 2 weeks.  Patient stable and appropriate for discharge home   Impression: Lumbar musculoskeletal pain and radiculopathy Acute uncomplicated cystitis  Disposition:  The patient was discharged home with instructions to take course of nitrofurantoin  as prescribed.  Follow-up with her back doctor as needed if pain is not starting to improve in the next 2 weeks.  May use Tylenol  and ibuprofen  as needed for her pain at home.  May use heating packs on her back as needed for pain.  Continue with gentle physical activity to help with pain. Return precautions given.    Record Review: External records from outside source obtained and reviewed including BMP from 06/08/2024 with normal creatinine and glucose     This chart was dictated using voice recognition software, Dragon. Despite the best efforts of this provider to proofread and correct errors, errors may still occur which can change documentation meaning.       Final diagnoses:  Lumbar strain, initial encounter  Lumbar radiculopathy  Acute cystitis without hematuria    ED Discharge Orders          Ordered    nitrofurantoin , macrocrystal-monohydrate, (MACROBID ) 100 MG capsule  2 times daily        07/11/24  0955               Veta Palma, PA-C 07/11/24 1001    Cottie Donnice PARAS, MD 07/11/24 1046

## 2024-07-13 LAB — URINE CULTURE: Culture: >=100000 — AB

## 2024-07-14 ENCOUNTER — Telehealth (HOSPITAL_BASED_OUTPATIENT_CLINIC_OR_DEPARTMENT_OTHER): Payer: Self-pay | Admitting: *Deleted

## 2024-07-14 ENCOUNTER — Other Ambulatory Visit (HOSPITAL_BASED_OUTPATIENT_CLINIC_OR_DEPARTMENT_OTHER): Payer: Self-pay

## 2024-07-14 MED ORDER — NALOXONE HCL 4 MG/0.1ML NA LIQD
1.0000 | NASAL | 0 refills | Status: AC | PRN
  Filled 2024-07-14: qty 2, 1d supply, fill #0

## 2024-07-14 MED ORDER — OXYCODONE-ACETAMINOPHEN 10-325 MG PO TABS
1.0000 | ORAL_TABLET | Freq: Three times a day (TID) | ORAL | 0 refills | Status: DC | PRN
Start: 2024-07-14 — End: 2024-08-14
  Filled 2024-07-14: qty 90, 30d supply, fill #0

## 2024-07-14 NOTE — Telephone Encounter (Signed)
 Post ED Visit - Positive Culture Follow-up  Culture report reviewed by antimicrobial stewardship pharmacist: [facility] []  Rankin Dee, Pharm.D. []  Venetia Gully, Pharm.D., BCPS AQ-ID []  Garrel Crews, Pharm.D., BCPS []  Almarie Lunger, Pharm.D., BCPS []  [place], [address].D., BCPS, AAHIVP []  Rosaline Bihari, Pharm.D., BCPS, AAHIVP []  Vernell Meier, PharmD, BCPS []  Latanya Hint, PharmD, BCPS []  Donald Medley, PharmD, BCPS []  Rocky Bold, PharmD []  Dorothyann Alert, PharmD, BCPS [x]  Koren Or, PharmD  [facility] []  Rosaline Edison, PharmD []  Romona Bliss, PharmD []  Dolphus Roller, PharmD []  Veva Seip, Rph []  Vernell Daunt) Leonce, PharmD []  Eva Allis, PharmD []  Rosaline Millet, PharmD []  Iantha Batch, PharmD []  Arvin Gauss, PharmD []  Wanda Hasting, PharmD []  Ronal Rav, PharmD []  Rocky Slade, PharmD []  Bard Jeans, PharmD   Positive urine culture Treated with Nitrofurantoin , organism sensitive to the same and no further patient follow-up is required at this time.  Nicole [name] 07/14/2024, 9:17 AM

## 2024-08-14 ENCOUNTER — Other Ambulatory Visit (HOSPITAL_BASED_OUTPATIENT_CLINIC_OR_DEPARTMENT_OTHER): Payer: Self-pay

## 2024-08-14 MED ORDER — NALOXONE HCL 4 MG/0.1ML NA LIQD
NASAL | 0 refills | Status: DC
  Filled 2024-08-14: qty 2, 1d supply, fill #0

## 2024-08-14 MED ORDER — OXYCODONE-ACETAMINOPHEN 10-325 MG PO TABS
1.0000 | ORAL_TABLET | Freq: Three times a day (TID) | ORAL | 0 refills | Status: DC | PRN
  Filled 2024-08-14: qty 90, 30d supply, fill #0

## 2024-08-14 MED ORDER — OYSTER CALCIUM 500 MG PO TABS
1250.0000 mg | ORAL_TABLET | Freq: Two times a day (BID) | ORAL | 3 refills | Status: AC
  Filled 2024-08-14: qty 100, 50d supply, fill #0

## 2024-08-14 MED ORDER — ALENDRONATE SODIUM 70 MG PO TABS
70.0000 mg | ORAL_TABLET | ORAL | 3 refills | Status: AC
  Filled 2024-08-14: qty 12, 84d supply, fill #0

## 2024-08-15 ENCOUNTER — Other Ambulatory Visit (HOSPITAL_BASED_OUTPATIENT_CLINIC_OR_DEPARTMENT_OTHER): Payer: Self-pay

## 2024-09-12 ENCOUNTER — Other Ambulatory Visit (HOSPITAL_BASED_OUTPATIENT_CLINIC_OR_DEPARTMENT_OTHER): Payer: Self-pay

## 2024-09-12 ENCOUNTER — Other Ambulatory Visit: Payer: Self-pay

## 2024-09-12 MED ORDER — ALENDRONATE SODIUM 70 MG PO TABS: 70.0000 mg | ORAL_TABLET | ORAL | 3 refills | Status: AC

## 2024-09-12 MED ORDER — CALCIUM CARBONATE 1250 (500 CA) MG PO TABS
1.0000 | ORAL_TABLET | Freq: Two times a day (BID) | ORAL | 3 refills | Status: AC
  Filled 2024-09-12: qty 100, 50d supply, fill #0

## 2024-09-12 MED ORDER — OXYCODONE-ACETAMINOPHEN 10-325 MG PO TABS
1.0000 | ORAL_TABLET | Freq: Three times a day (TID) | ORAL | 0 refills | Status: DC | PRN
  Filled 2024-09-12: qty 90, 30d supply, fill #0

## 2024-09-12 MED ORDER — VITAMIN D (ERGOCALCIFEROL) 1.25 MG (50000 UNIT) PO CAPS
50000.0000 [IU] | ORAL_CAPSULE | ORAL | 1 refills | Status: AC
  Filled 2024-09-12: qty 12, 84d supply, fill #0

## 2024-09-12 MED ORDER — NALOXONE HCL 4 MG/0.1ML NA LIQD
NASAL | 0 refills | Status: AC
  Filled 2024-09-12: qty 2, 30d supply, fill #0

## 2024-09-14 ENCOUNTER — Other Ambulatory Visit (HOSPITAL_BASED_OUTPATIENT_CLINIC_OR_DEPARTMENT_OTHER): Payer: Self-pay

## 2024-10-14 ENCOUNTER — Other Ambulatory Visit (HOSPITAL_BASED_OUTPATIENT_CLINIC_OR_DEPARTMENT_OTHER): Payer: Self-pay

## 2024-10-14 MED ORDER — OXYCODONE-ACETAMINOPHEN 10-325 MG PO TABS
1.0000 | ORAL_TABLET | Freq: Three times a day (TID) | ORAL | 0 refills | Status: DC | PRN
  Filled 2024-10-14: qty 90, 30d supply, fill #0

## 2024-10-15 ENCOUNTER — Other Ambulatory Visit (HOSPITAL_BASED_OUTPATIENT_CLINIC_OR_DEPARTMENT_OTHER): Payer: Self-pay

## 2024-10-28 ENCOUNTER — Other Ambulatory Visit (HOSPITAL_BASED_OUTPATIENT_CLINIC_OR_DEPARTMENT_OTHER): Payer: Self-pay

## 2024-11-07 ENCOUNTER — Other Ambulatory Visit (HOSPITAL_BASED_OUTPATIENT_CLINIC_OR_DEPARTMENT_OTHER): Payer: Self-pay

## 2024-11-07 ENCOUNTER — Other Ambulatory Visit: Payer: Self-pay

## 2024-11-07 MED ORDER — NARATRIPTAN HCL 2.5 MG PO TABS
ORAL_TABLET | ORAL | 9 refills | Status: AC
  Filled 2024-11-07: qty 9, 15d supply, fill #0

## 2024-11-07 MED ORDER — AIMOVIG 140 MG/ML ~~LOC~~ SOAJ
SUBCUTANEOUS | 11 refills | Status: AC
  Filled 2024-11-07: qty 1, 30d supply, fill #0

## 2024-11-13 ENCOUNTER — Other Ambulatory Visit: Payer: Self-pay

## 2024-11-13 ENCOUNTER — Other Ambulatory Visit (HOSPITAL_BASED_OUTPATIENT_CLINIC_OR_DEPARTMENT_OTHER): Payer: Self-pay

## 2024-11-13 MED ORDER — OYSTER SHELL CALCIUM 500 MG PO TABS
1.0000 | ORAL_TABLET | Freq: Two times a day (BID) | ORAL | 3 refills | Status: AC
  Filled 2024-11-13: qty 120, 60d supply, fill #0

## 2024-11-13 MED ORDER — VITAMIN D (ERGOCALCIFEROL) 1.25 MG (50000 UNIT) PO CAPS: 50000.0000 [IU] | ORAL_CAPSULE | ORAL | 1 refills | Status: AC

## 2024-11-13 MED ORDER — OXYCODONE-ACETAMINOPHEN 10-325 MG PO TABS
1.0000 | ORAL_TABLET | Freq: Three times a day (TID) | ORAL | 0 refills | Status: DC | PRN
  Filled 2024-11-13: qty 90, 30d supply, fill #0

## 2024-11-13 MED ORDER — ALENDRONATE SODIUM 70 MG PO TABS
70.0000 mg | ORAL_TABLET | ORAL | 3 refills | Status: AC
  Filled 2024-11-13: qty 12, 84d supply, fill #0

## 2024-11-14 ENCOUNTER — Other Ambulatory Visit (HOSPITAL_BASED_OUTPATIENT_CLINIC_OR_DEPARTMENT_OTHER): Payer: Self-pay

## 2024-11-24 ENCOUNTER — Other Ambulatory Visit (HOSPITAL_BASED_OUTPATIENT_CLINIC_OR_DEPARTMENT_OTHER): Payer: Self-pay

## 2024-12-15 ENCOUNTER — Other Ambulatory Visit: Payer: Self-pay

## 2024-12-15 ENCOUNTER — Other Ambulatory Visit (HOSPITAL_BASED_OUTPATIENT_CLINIC_OR_DEPARTMENT_OTHER): Payer: Self-pay

## 2024-12-15 MED ORDER — OYSTER CALCIUM 500 MG PO TABS
ORAL_TABLET | ORAL | 3 refills | Status: AC
  Filled 2024-12-15: qty 60, 30d supply, fill #0

## 2024-12-15 MED ORDER — OXYCODONE-ACETAMINOPHEN 10-325 MG PO TABS
1.0000 | ORAL_TABLET | Freq: Three times a day (TID) | ORAL | 0 refills | Status: DC | PRN
  Filled 2024-12-15: qty 90, 30d supply, fill #0

## 2024-12-15 MED ORDER — VITAMIN D (ERGOCALCIFEROL) 1.25 MG (50000 UNIT) PO CAPS
50000.0000 [IU] | ORAL_CAPSULE | ORAL | 1 refills | Status: AC
  Filled 2024-12-15: qty 12, 84d supply, fill #0

## 2024-12-15 MED ORDER — ALENDRONATE SODIUM 70 MG PO TABS
70.0000 mg | ORAL_TABLET | ORAL | 3 refills | Status: AC
  Filled 2024-12-15: qty 12, 84d supply, fill #0

## 2025-01-14 ENCOUNTER — Other Ambulatory Visit (HOSPITAL_BASED_OUTPATIENT_CLINIC_OR_DEPARTMENT_OTHER): Payer: Self-pay

## 2025-01-14 MED ORDER — OYSTER SHELL CALCIUM 500 MG PO TABS
1.0000 | ORAL_TABLET | Freq: Two times a day (BID) | ORAL | 3 refills | Status: AC
  Filled 2025-01-14: qty 60, 30d supply, fill #0

## 2025-01-14 MED ORDER — OXYCODONE-ACETAMINOPHEN 10-325 MG PO TABS
1.0000 | ORAL_TABLET | Freq: Three times a day (TID) | ORAL | 0 refills | Status: AC | PRN
  Filled 2025-01-14: qty 90, 30d supply, fill #0

## 2025-01-14 MED ORDER — PHENTERMINE HCL 37.5 MG PO TABS
37.5000 mg | ORAL_TABLET | Freq: Every day | ORAL | 0 refills | Status: AC
  Filled 2025-01-14: qty 30, 30d supply, fill #0

## 2025-01-14 MED ORDER — VITAMIN D (ERGOCALCIFEROL) 1.25 MG (50000 UNIT) PO CAPS: 50000.0000 [IU] | ORAL_CAPSULE | ORAL | 1 refills | Status: AC

## 2025-01-14 MED ORDER — ALENDRONATE SODIUM 70 MG PO TABS
70.0000 mg | ORAL_TABLET | ORAL | 3 refills | Status: AC
  Filled 2025-01-14: qty 12, 84d supply, fill #0

## 2025-01-15 ENCOUNTER — Other Ambulatory Visit (HOSPITAL_BASED_OUTPATIENT_CLINIC_OR_DEPARTMENT_OTHER): Payer: Self-pay

## 2025-01-15 ENCOUNTER — Other Ambulatory Visit: Payer: Self-pay

## 2025-01-16 ENCOUNTER — Other Ambulatory Visit (HOSPITAL_BASED_OUTPATIENT_CLINIC_OR_DEPARTMENT_OTHER): Payer: Self-pay
